# Patient Record
Sex: Male | Born: 1955 | Race: White | Hispanic: No | Marital: Married | Smoking: Former smoker
Health system: Southern US, Community
[De-identification: ages and names within clinical notes are randomized; demographics above are authoritative.]

## PROBLEM LIST (undated history)

## (undated) ENCOUNTER — Emergency Department (HOSPITAL_COMMUNITY): Discharge status: Absent without leave | Payer: BLUE CROSS/BLUE SHIELD

## (undated) DIAGNOSIS — D7582 Heparin induced thrombocytopenia (HIT): Secondary | ICD-10-CM

## (undated) DIAGNOSIS — I509 Heart failure, unspecified: Secondary | ICD-10-CM

## (undated) DIAGNOSIS — M545 Low back pain, unspecified: Secondary | ICD-10-CM

## (undated) DIAGNOSIS — I779 Disorder of arteries and arterioles, unspecified: Secondary | ICD-10-CM

## (undated) DIAGNOSIS — I5032 Chronic diastolic (congestive) heart failure: Secondary | ICD-10-CM

## (undated) DIAGNOSIS — D759 Disease of blood and blood-forming organs, unspecified: Secondary | ICD-10-CM

## (undated) DIAGNOSIS — E119 Type 2 diabetes mellitus without complications: Secondary | ICD-10-CM

## (undated) DIAGNOSIS — I209 Angina pectoris, unspecified: Secondary | ICD-10-CM

## (undated) DIAGNOSIS — G4730 Sleep apnea, unspecified: Secondary | ICD-10-CM

## (undated) DIAGNOSIS — J449 Chronic obstructive pulmonary disease, unspecified: Secondary | ICD-10-CM

## (undated) DIAGNOSIS — D649 Anemia, unspecified: Secondary | ICD-10-CM

## (undated) DIAGNOSIS — K219 Gastro-esophageal reflux disease without esophagitis: Secondary | ICD-10-CM

## (undated) DIAGNOSIS — F419 Anxiety disorder, unspecified: Secondary | ICD-10-CM

## (undated) DIAGNOSIS — R609 Edema, unspecified: Secondary | ICD-10-CM

## (undated) DIAGNOSIS — I499 Cardiac arrhythmia, unspecified: Secondary | ICD-10-CM

## (undated) DIAGNOSIS — I2510 Atherosclerotic heart disease of native coronary artery without angina pectoris: Secondary | ICD-10-CM

## (undated) DIAGNOSIS — I219 Acute myocardial infarction, unspecified: Secondary | ICD-10-CM

## (undated) DIAGNOSIS — F32A Depression, unspecified: Secondary | ICD-10-CM

## (undated) DIAGNOSIS — E669 Obesity, unspecified: Secondary | ICD-10-CM

## (undated) DIAGNOSIS — C44311 Basal cell carcinoma of skin of nose: Secondary | ICD-10-CM

## (undated) DIAGNOSIS — D75829 Heparin-induced thrombocytopenia, unspecified: Secondary | ICD-10-CM

## (undated) DIAGNOSIS — R011 Cardiac murmur, unspecified: Secondary | ICD-10-CM

## (undated) DIAGNOSIS — IMO0001 Reserved for inherently not codable concepts without codable children: Secondary | ICD-10-CM

## (undated) DIAGNOSIS — Z9981 Dependence on supplemental oxygen: Secondary | ICD-10-CM

## (undated) DIAGNOSIS — F329 Major depressive disorder, single episode, unspecified: Secondary | ICD-10-CM

## (undated) DIAGNOSIS — E785 Hyperlipidemia, unspecified: Secondary | ICD-10-CM

## (undated) DIAGNOSIS — C8590 Non-Hodgkin lymphoma, unspecified, unspecified site: Principal | ICD-10-CM

## (undated) DIAGNOSIS — I739 Peripheral vascular disease, unspecified: Secondary | ICD-10-CM

## (undated) DIAGNOSIS — I10 Essential (primary) hypertension: Secondary | ICD-10-CM

## (undated) DIAGNOSIS — N183 Chronic kidney disease, stage 3 unspecified: Secondary | ICD-10-CM

## (undated) DIAGNOSIS — G8929 Other chronic pain: Secondary | ICD-10-CM

## (undated) HISTORY — DX: Obesity, unspecified: E66.9

## (undated) HISTORY — DX: Atherosclerotic heart disease of native coronary artery without angina pectoris: I25.10

## (undated) HISTORY — DX: Heparin-induced thrombocytopenia, unspecified: D75.829

## (undated) HISTORY — PX: SUPERFICIAL LYMPH NODE BIOPSY / EXCISION: SUR127

## (undated) HISTORY — DX: Sleep apnea, unspecified: G47.30

## (undated) HISTORY — DX: Major depressive disorder, single episode, unspecified: F32.9

## (undated) HISTORY — PX: BASAL CELL CARCINOMA EXCISION: SHX1214

## (undated) HISTORY — DX: Non-Hodgkin lymphoma, unspecified, unspecified site: C85.90

## (undated) HISTORY — DX: Depression, unspecified: F32.A

## (undated) HISTORY — DX: Chronic obstructive pulmonary disease, unspecified: J44.9

## (undated) HISTORY — DX: Gastro-esophageal reflux disease without esophagitis: K21.9

## (undated) HISTORY — DX: Edema, unspecified: R60.9

## (undated) HISTORY — DX: Hyperlipidemia, unspecified: E78.5

## (undated) HISTORY — DX: Heparin induced thrombocytopenia (HIT): D75.82

## (undated) HISTORY — DX: Essential (primary) hypertension: I10

## (undated) HISTORY — DX: Type 2 diabetes mellitus without complications: E11.9

---

## 1999-05-11 ENCOUNTER — Ambulatory Visit (HOSPITAL_COMMUNITY): Admission: RE | Admit: 1999-05-11 | Discharge: 1999-05-11 | Payer: Self-pay | Admitting: Cardiovascular Disease

## 2000-04-04 ENCOUNTER — Emergency Department (HOSPITAL_COMMUNITY): Admission: EM | Admit: 2000-04-04 | Discharge: 2000-04-04 | Payer: Self-pay | Admitting: Emergency Medicine

## 2000-04-04 ENCOUNTER — Ambulatory Visit (HOSPITAL_COMMUNITY): Admission: RE | Admit: 2000-04-04 | Discharge: 2000-04-04 | Payer: Self-pay | Admitting: Urology

## 2000-04-04 ENCOUNTER — Encounter: Admission: RE | Admit: 2000-04-04 | Discharge: 2000-04-04 | Payer: Self-pay | Admitting: Urology

## 2000-04-04 ENCOUNTER — Encounter: Payer: Self-pay | Admitting: Urology

## 2000-04-09 ENCOUNTER — Ambulatory Visit (HOSPITAL_COMMUNITY): Admission: RE | Admit: 2000-04-09 | Discharge: 2000-04-09 | Payer: Self-pay | Admitting: Urology

## 2000-04-09 ENCOUNTER — Encounter: Payer: Self-pay | Admitting: Urology

## 2000-04-10 ENCOUNTER — Emergency Department (HOSPITAL_COMMUNITY): Admission: EM | Admit: 2000-04-10 | Discharge: 2000-04-10 | Payer: Self-pay | Admitting: Emergency Medicine

## 2000-04-11 ENCOUNTER — Ambulatory Visit (HOSPITAL_COMMUNITY): Admission: RE | Admit: 2000-04-11 | Discharge: 2000-04-12 | Payer: Self-pay | Admitting: Urology

## 2000-04-12 ENCOUNTER — Encounter: Payer: Self-pay | Admitting: Urology

## 2000-04-17 ENCOUNTER — Encounter: Admission: RE | Admit: 2000-04-17 | Discharge: 2000-04-17 | Payer: Self-pay | Admitting: Urology

## 2000-04-17 ENCOUNTER — Encounter: Payer: Self-pay | Admitting: Urology

## 2000-04-20 ENCOUNTER — Encounter: Payer: Self-pay | Admitting: Emergency Medicine

## 2000-04-21 ENCOUNTER — Inpatient Hospital Stay (HOSPITAL_COMMUNITY): Admission: EM | Admit: 2000-04-21 | Discharge: 2000-04-22 | Payer: Self-pay | Admitting: Emergency Medicine

## 2000-05-14 ENCOUNTER — Encounter: Admission: RE | Admit: 2000-05-14 | Discharge: 2000-05-14 | Payer: Self-pay | Admitting: Urology

## 2000-05-14 ENCOUNTER — Encounter: Payer: Self-pay | Admitting: Urology

## 2000-06-14 ENCOUNTER — Encounter: Payer: Self-pay | Admitting: Urology

## 2000-06-14 ENCOUNTER — Ambulatory Visit (HOSPITAL_COMMUNITY): Admission: RE | Admit: 2000-06-14 | Discharge: 2000-06-14 | Payer: Self-pay | Admitting: Urology

## 2000-06-19 ENCOUNTER — Encounter: Payer: Self-pay | Admitting: Urology

## 2000-06-19 ENCOUNTER — Emergency Department (HOSPITAL_COMMUNITY): Admission: EM | Admit: 2000-06-19 | Discharge: 2000-06-19 | Payer: Self-pay | Admitting: Emergency Medicine

## 2000-06-27 ENCOUNTER — Encounter: Payer: Self-pay | Admitting: Urology

## 2000-06-27 ENCOUNTER — Encounter: Admission: RE | Admit: 2000-06-27 | Discharge: 2000-06-27 | Payer: Self-pay | Admitting: Urology

## 2000-08-21 ENCOUNTER — Encounter: Payer: Self-pay | Admitting: Urology

## 2000-08-21 ENCOUNTER — Encounter: Admission: RE | Admit: 2000-08-21 | Discharge: 2000-08-21 | Payer: Self-pay | Admitting: Urology

## 2002-04-30 ENCOUNTER — Ambulatory Visit (HOSPITAL_BASED_OUTPATIENT_CLINIC_OR_DEPARTMENT_OTHER): Admission: RE | Admit: 2002-04-30 | Discharge: 2002-04-30 | Payer: Self-pay | Admitting: Cardiovascular Disease

## 2003-08-15 HISTORY — PX: CORONARY ARTERY BYPASS GRAFT: SHX141

## 2004-02-12 ENCOUNTER — Inpatient Hospital Stay (HOSPITAL_BASED_OUTPATIENT_CLINIC_OR_DEPARTMENT_OTHER): Admission: RE | Admit: 2004-02-12 | Discharge: 2004-02-12 | Payer: Self-pay | Admitting: Cardiology

## 2004-02-26 ENCOUNTER — Inpatient Hospital Stay (HOSPITAL_COMMUNITY): Admission: AD | Admit: 2004-02-26 | Discharge: 2004-03-04 | Payer: Self-pay | Admitting: Cardiology

## 2004-06-22 ENCOUNTER — Ambulatory Visit: Payer: Self-pay | Admitting: Internal Medicine

## 2004-07-01 ENCOUNTER — Ambulatory Visit: Payer: Self-pay | Admitting: Cardiovascular Disease

## 2005-02-08 ENCOUNTER — Ambulatory Visit: Payer: Self-pay | Admitting: Cardiovascular Disease

## 2005-08-25 ENCOUNTER — Ambulatory Visit: Payer: Self-pay | Admitting: Endocrinology

## 2005-09-01 ENCOUNTER — Encounter: Payer: Self-pay | Admitting: Cardiology

## 2005-09-01 ENCOUNTER — Ambulatory Visit: Payer: Self-pay

## 2006-01-01 ENCOUNTER — Ambulatory Visit: Payer: Self-pay | Admitting: Cardiovascular Disease

## 2007-01-14 ENCOUNTER — Ambulatory Visit: Payer: Self-pay | Admitting: Cardiovascular Disease

## 2007-05-23 ENCOUNTER — Encounter: Payer: Self-pay | Admitting: *Deleted

## 2007-05-23 DIAGNOSIS — I10 Essential (primary) hypertension: Secondary | ICD-10-CM | POA: Insufficient documentation

## 2007-05-23 DIAGNOSIS — E785 Hyperlipidemia, unspecified: Secondary | ICD-10-CM | POA: Insufficient documentation

## 2007-05-23 DIAGNOSIS — G4730 Sleep apnea, unspecified: Secondary | ICD-10-CM | POA: Insufficient documentation

## 2007-07-19 ENCOUNTER — Ambulatory Visit: Payer: Self-pay | Admitting: Cardiovascular Disease

## 2008-02-03 ENCOUNTER — Ambulatory Visit: Payer: Self-pay | Admitting: Cardiovascular Disease

## 2008-03-06 ENCOUNTER — Ambulatory Visit: Payer: Self-pay

## 2008-03-11 ENCOUNTER — Ambulatory Visit: Payer: Self-pay | Admitting: Vascular Surgery

## 2008-03-14 HISTORY — PX: CAROTID ENDARTERECTOMY: SUR193

## 2008-03-14 HISTORY — PX: OTHER SURGICAL HISTORY: SHX169

## 2008-03-17 ENCOUNTER — Inpatient Hospital Stay (HOSPITAL_COMMUNITY): Admission: RE | Admit: 2008-03-17 | Discharge: 2008-03-19 | Payer: Self-pay | Admitting: Vascular Surgery

## 2008-03-17 ENCOUNTER — Encounter: Payer: Self-pay | Admitting: Vascular Surgery

## 2008-03-17 ENCOUNTER — Ambulatory Visit: Payer: Self-pay | Admitting: Vascular Surgery

## 2008-04-01 ENCOUNTER — Ambulatory Visit: Payer: Self-pay | Admitting: Vascular Surgery

## 2008-05-11 ENCOUNTER — Ambulatory Visit: Payer: Self-pay | Admitting: Vascular Surgery

## 2008-10-14 ENCOUNTER — Ambulatory Visit: Payer: Self-pay | Admitting: Vascular Surgery

## 2008-10-15 ENCOUNTER — Encounter: Admission: RE | Admit: 2008-10-15 | Discharge: 2008-10-15 | Payer: Self-pay | Admitting: Vascular Surgery

## 2008-10-21 ENCOUNTER — Ambulatory Visit: Payer: Self-pay | Admitting: Vascular Surgery

## 2009-02-11 DIAGNOSIS — I779 Disorder of arteries and arterioles, unspecified: Secondary | ICD-10-CM | POA: Insufficient documentation

## 2009-02-11 DIAGNOSIS — I739 Peripheral vascular disease, unspecified: Secondary | ICD-10-CM

## 2009-02-11 DIAGNOSIS — D7582 Heparin induced thrombocytopenia (HIT): Secondary | ICD-10-CM | POA: Insufficient documentation

## 2009-02-11 DIAGNOSIS — R609 Edema, unspecified: Secondary | ICD-10-CM | POA: Insufficient documentation

## 2009-02-12 ENCOUNTER — Ambulatory Visit: Payer: Self-pay | Admitting: Cardiovascular Disease

## 2009-06-23 ENCOUNTER — Ambulatory Visit: Payer: Self-pay | Admitting: Internal Medicine

## 2009-06-23 ENCOUNTER — Ambulatory Visit: Payer: Self-pay | Admitting: Cardiovascular Disease

## 2009-06-23 ENCOUNTER — Inpatient Hospital Stay (HOSPITAL_COMMUNITY): Admission: EM | Admit: 2009-06-23 | Discharge: 2009-06-25 | Payer: Self-pay | Admitting: Emergency Medicine

## 2009-06-24 ENCOUNTER — Encounter: Payer: Self-pay | Admitting: Internal Medicine

## 2009-06-24 ENCOUNTER — Encounter: Payer: Self-pay | Admitting: Cardiology

## 2009-06-25 ENCOUNTER — Encounter (INDEPENDENT_AMBULATORY_CARE_PROVIDER_SITE_OTHER): Payer: Self-pay | Admitting: *Deleted

## 2009-06-29 ENCOUNTER — Ambulatory Visit: Payer: Self-pay | Admitting: Internal Medicine

## 2009-06-29 DIAGNOSIS — J449 Chronic obstructive pulmonary disease, unspecified: Secondary | ICD-10-CM | POA: Insufficient documentation

## 2009-06-29 DIAGNOSIS — K219 Gastro-esophageal reflux disease without esophagitis: Secondary | ICD-10-CM | POA: Insufficient documentation

## 2009-06-29 DIAGNOSIS — R599 Enlarged lymph nodes, unspecified: Secondary | ICD-10-CM | POA: Insufficient documentation

## 2009-07-06 ENCOUNTER — Encounter: Payer: Self-pay | Admitting: Internal Medicine

## 2009-10-07 ENCOUNTER — Telehealth (INDEPENDENT_AMBULATORY_CARE_PROVIDER_SITE_OTHER): Payer: Self-pay | Admitting: *Deleted

## 2010-09-13 NOTE — Progress Notes (Signed)
       Additional Follow-up for Phone Call Additional follow up Details #2::    is pt suppose to take metorprolol succ or tartrate if pt has succ at home  we will leave him on this pt was recently at Kalman Shan MD metoptrolol tartrate was in med list pt states he is on metoprolol 50 succ 2 tabs daliy. Per D mathis ok to fill as this metoprolol succ 50 2 dailygave pt 6 refills Follow-up by: Kem Parkinson,  October 07, 2009 4:10 PM

## 2010-10-19 ENCOUNTER — Encounter: Payer: Self-pay | Admitting: Cardiovascular Disease

## 2010-10-25 NOTE — Miscellaneous (Signed)
  Clinical Lists Changes   Medications: Changed medication from METOPROLOL TARTRATE 50 MG TABS (METOPROLOL TARTRATE) Take 2 tablets once daily to METOPROLOL SUCCINATE 50 MG XR24H-TAB (METOPROLOL SUCCINATE) take 2 tablets by mouth daily.per previous phone note. [place], [place]  October 19, 2010 9:27 AM

## 2010-10-31 ENCOUNTER — Telehealth: Payer: Self-pay | Admitting: Internal Medicine

## 2010-11-10 ENCOUNTER — Telehealth: Payer: Self-pay | Admitting: *Deleted

## 2010-11-10 NOTE — Telephone Encounter (Signed)
Summary of Call: copd patient not seen since 2010. call and find out if he wants to come. pls give him fu Initial call taken by: Kalman Shan MD,  October 31, 2010 10:24 PM  ATCx1 no answer, no voicemail. WCB. Carron Curie, CMA

## 2010-11-15 NOTE — Progress Notes (Signed)
Summary: needs fu. not seen since 2010  Phone Note Outgoing Call   Summary of Call: copd patient not seen since 2010. call and find out if he wants to come. pls give him fu Initial call taken by: Kalman Shan MD,  October 31, 2010 10:24 PM  Follow-up for Phone Call        see. epic. Carron Curie CMA  November 10, 2010 12:15 PM

## 2010-11-16 LAB — BASIC METABOLIC PANEL
Calcium: 9.2 mg/dL (ref 8.4–10.5)
Calcium: 9.6 mg/dL (ref 8.4–10.5)
Chloride: 100 mEq/L (ref 96–112)
Creatinine, Ser: 0.97 mg/dL (ref 0.4–1.5)
Creatinine, Ser: 0.99 mg/dL (ref 0.4–1.5)
GFR calc Af Amer: >60 mL/min (ref >60)
GFR calc non Af Amer: >60 mL/min (ref >60)
GFR calc non Af Amer: >60 mL/min (ref >60)
Glucose, Bld: 145 mg/dL — ABNORMAL HIGH (ref 70–99)
Potassium: 5 mEq/L (ref 3.5–5.1)
Sodium: 138 mEq/L (ref 135–145)

## 2010-11-16 LAB — COMPREHENSIVE METABOLIC PANEL
AST: 32 U/L (ref 0–37)
Albumin: 4 g/dL (ref 3.5–5.2)
Chloride: 99 mEq/L (ref 96–112)
Creatinine, Ser: 0.88 mg/dL (ref 0.4–1.5)
GFR calc non Af Amer: >60 mL/min (ref >60)
Total Protein: 7.3 g/dL (ref 6.0–8.3)

## 2010-11-16 LAB — CBC
Hemoglobin: 15.4 g/dL (ref 13.0–17.0)
MCHC: 34.2 g/dL (ref 30.0–36.0)
RBC: 4.65 MIL/uL (ref 4.22–5.81)

## 2010-11-16 LAB — HEMOGLOBIN A1C: Hgb A1c MFr Bld: 5.7 % (ref 4.6–6.1)

## 2010-11-16 LAB — CARDIAC PANEL(CRET KIN+CKTOT+MB+TROPI)
CK, MB: 4.4 ng/mL — ABNORMAL HIGH (ref 0.3–4.0)
Relative Index: 1.8 (ref 0.0–2.5)
Relative Index: 1.9 (ref 0.0–2.5)
Total CK: 226 U/L (ref 7–232)
Troponin I: 0.01 ng/mL (ref 0.00–0.06)
Troponin I: 0.02 ng/mL (ref 0.00–0.06)

## 2010-11-16 LAB — BRAIN NATRIURETIC PEPTIDE: Pro B Natriuretic peptide (BNP): 37 pg/mL (ref 0.0–100.0)

## 2010-11-16 LAB — GLUCOSE, CAPILLARY: Glucose-Capillary: 169 mg/dL — ABNORMAL HIGH (ref 70–99)

## 2010-11-16 LAB — TSH: TSH: 1.832 u[IU]/mL (ref 0.350–4.500)

## 2010-11-16 NOTE — Telephone Encounter (Signed)
ATCx2. WCB. Carron Curie, CMA

## 2010-11-17 NOTE — Telephone Encounter (Signed)
Pt set to see MR on 12-02-10 at 4:15.Carron Curie, CMA

## 2010-12-02 ENCOUNTER — Encounter: Payer: Self-pay | Admitting: Internal Medicine

## 2010-12-02 ENCOUNTER — Ambulatory Visit (INDEPENDENT_AMBULATORY_CARE_PROVIDER_SITE_OTHER): Payer: BC Managed Care – PPO | Admitting: Internal Medicine

## 2010-12-02 VITALS — BP 150/78 | HR 69 | Temp 98.7°F | Ht 72.0 in | Wt 367.4 lb

## 2010-12-02 DIAGNOSIS — Z720 Tobacco use: Secondary | ICD-10-CM | POA: Insufficient documentation

## 2010-12-02 DIAGNOSIS — F17200 Nicotine dependence, unspecified, uncomplicated: Secondary | ICD-10-CM

## 2010-12-02 DIAGNOSIS — J449 Chronic obstructive pulmonary disease, unspecified: Secondary | ICD-10-CM

## 2010-12-02 MED ORDER — ALBUTEROL 90 MCG/ACT IN AERS: 2.0000 | INHALATION_SPRAY | Freq: Four times a day (QID) | RESPIRATORY_TRACT | Status: DC | PRN

## 2010-12-02 MED ORDER — TIOTROPIUM BROMIDE MONOHYDRATE 18 MCG IN CAPS: 18.0000 ug | ORAL_CAPSULE | Freq: Every day | RESPIRATORY_TRACT | Status: DC

## 2010-12-02 NOTE — Patient Instructions (Signed)
#  COPD Restart spiriva 1 puff daily - take sample, discount card and learn technique Take albuterol only as needed #smoking Please work on quitting #followup 3 months to report progress Alpha 1 check at fu

## 2010-12-02 NOTE — Assessment & Plan Note (Signed)
Stable disease  Plan Restart spiriva Albuterol prn

## 2010-12-02 NOTE — Progress Notes (Signed)
Subjective:    Patient ID: [name], male    DOB: 12-10-1955, 55 y.o.   MRN: [id number]  HPI14 year old morbidly obese male with CAD and OSA (non compliant with CPAP). Left sided NECk  Node SUV 4 on PET Nov 2010 followed by Dr Annalee Genta and gold stage 3 COPD (fev1 1.31/32% in nov 2010), and continued tobacco abuse  OV 12/02/2010: Last seen in Nov 2010. Not seen since then. Since then doing well overall. Smoking relapsed. Wants to quit but refuses chantix and zyban. STates he will try vapors of menthol. Dyspnea is class 2 and stable. Has not followed iwthDr. Annalee Genta regarding neck node; states he sppoke to him and was told it is benign and he is self-monitoring. No problems so far. Mild cough +    Review of Systems Constitutional:   No  weight loss, night sweats,  Fevers, chills, fatigue, lassitude. HEENT:   No headaches,  Difficulty swallowing,  Tooth/dental problems,  Sore throat,                No sneezing, itching, ear ache, nasal congestion, post nasal drip,   CV:  No chest pain,  Orthopnea, PND, swelling in lower extremities, anasarca, dizziness, palpitations  GI  No heartburn, indigestion, abdominal pain, nausea, vomiting, diarrhea, change in bowel habits, loss of appetite  Resp:   No coughing up of blood.  No change in color of mucus.  No wheezing.  No chest wall deformity  Skin: no rash or lesions.  GU: no dysuria, change in color of urine, no urgency or frequency.  No flank pain.  MS:  No joint pain or swelling.  No decreased range of motion.  No back pain.  Psych:  No change in mood or affect. No depression or anxiety.  No memory loss.      Objective:   Physical Exam  Nursing note and vitals reviewed. Constitutional: He is oriented to person, place, and time. He appears well-developed and well-nourished. No distress.       obese  HENT:  Head: Normocephalic and atraumatic.  Right Ear: External ear normal.  Left Ear: External ear normal.  Mouth/Throat:  Oropharynx is clear and moist. No oropharyngeal exudate.  Eyes: Conjunctivae and EOM are normal. Pupils are equal, round, and reactive to light. Right eye exhibits no discharge. Left eye exhibits no discharge. No scleral icterus.  Neck: Normal range of motion. Neck supple. No JVD present. No tracheal deviation present. No thyromegaly present.  Cardiovascular: Normal rate, regular rhythm and intact distal pulses.  Exam reveals no gallop and no friction rub.   No murmur heard. Pulmonary/Chest: Effort normal and breath sounds normal. No respiratory distress. He has no wheezes. He has no rales. He exhibits no tenderness.       Scar of surgery +  Abdominal: Soft. Bowel sounds are normal. He exhibits no distension and no mass. There is no tenderness. There is no rebound and no guarding.  Musculoskeletal: Normal range of motion. He exhibits no edema and no tenderness.  Lymphadenopathy:    He has no cervical adenopathy.  Neurological: He is alert and oriented to person, place, and time. He has normal reflexes. No cranial nerve deficit. Coordination normal.  Skin: Skin is warm and dry. No rash noted. He is not diaphoretic. No erythema. No pallor.  Psychiatric: He has a normal mood and affect. His behavior is normal. Judgment and thought content normal.          Assessment & Plan:

## 2010-12-02 NOTE — Assessment & Plan Note (Signed)
3 min counseling to quit. Refused medication intervention

## 2010-12-12 ENCOUNTER — Telehealth: Payer: Self-pay | Admitting: Internal Medicine

## 2010-12-12 NOTE — Telephone Encounter (Signed)
lmomtcb x1 According to phone note 12/02/10 rx was sent x 6 refills

## 2010-12-15 ENCOUNTER — Telehealth: Payer: Self-pay | Admitting: Internal Medicine

## 2010-12-15 DIAGNOSIS — J449 Chronic obstructive pulmonary disease, unspecified: Secondary | ICD-10-CM

## 2010-12-15 MED ORDER — ALBUTEROL 90 MCG/ACT IN AERS: 2.0000 | INHALATION_SPRAY | Freq: Four times a day (QID) | RESPIRATORY_TRACT | Status: DC | PRN

## 2010-12-15 NOTE — Telephone Encounter (Signed)
Spoke w/ pt and he states his rx for his albuterol was thrown away by accident. Pt needed [place] to call his albuterol rx in fro him. I advised pt would send it in for him. Rx accidentally printed off so re done rx to send electronically

## 2010-12-27 NOTE — Assessment & Plan Note (Signed)
OFFICE VISIT   [name], [name]  DOB:  1955/11/27                                       10/21/2008  VELFY#:[id number]   The patient is seen back in follow-up today.  He previously underwent  left carotid endarterectomy in August 2009.  He was seen in the office  last week and had a carotid duplex exam at that time which showed no  significant stenosis on the left side and a less than 40% stenosis on  the right side.  However, he had noticed a lump in his left  supraclavicular region.  He states that he had noticed this soon after  his carotid endarterectomy.  He does not think it has changed in size.  However, he states that it has not gotten any smaller.  It is not tender  on palpation.   He had a CT scan of the neck today to further evaluate this.  This shows  an enlarged left supraclavicular node which is 2 cm x 1.4 cm in  diameter.  There is also an adjacent node which is enlarged but slightly  smaller.  There is also some pleural plaque in the left lung apex.   On physical exam today blood pressure is 154/74 in the left arm, 134/75  in the right arm, pulse is 76 and regular.  Left neck incision is well-  healed, there is no erythema.  The supraclavicular node is still  palpable and it is nontender.  It is fairly rubbery in consistency and  not firm.   As far as the patient's carotid is concerned, he needs a repeat carotid  duplex exam in 6 months' time.  I have referred him to Dr. Osborn Coho for further evaluation of his left supraclavicular node.  He  will determine whether or not this will need to be managed  conservatively, or possible biopsy with fine-needle aspiration.   Scott Hora. Fields, MD  Electronically Signed   CEF/MEDQ  D:  10/21/2008  T:  10/22/2008  Job:  [id number]   cc:   Noralyn Pick. Eden Emms, MD, [facility]  Kinnie Scales. Annalee Genta, M.D.

## 2010-12-27 NOTE — Procedures (Signed)
CAROTID DUPLEX EXAM   INDICATION:  Carotid artery disease.   HISTORY:  Diabetes:  No.  Cardiac:  CABG.  Hypertension:  Yes.  Smoking:  Yes.  Previous Surgery:  Left carotid endarterectomy with Dacron patch  angioplasty on 03/17/08 by Dr. Darrick Penna.  CV History:  No.  Amaurosis Fugax No, Paresthesias No, Hemiparesis No.                                       RIGHT             LEFT  Brachial systolic pressure:         148               157  Brachial Doppler waveforms:         Within normal limits                Within normal limits  Vertebral direction of flow:        Not visualized    Antegrade  DUPLEX VELOCITIES (cm/sec)  CCA peak systolic                   97                114  ECA peak systolic                   375               582  ICA peak systolic                   113               140  ICA end diastolic                   42                48  PLAQUE MORPHOLOGY:                  Heterogenous      Heterogenous  PLAQUE AMOUNT:                      Moderate          Severe  PLAQUE LOCATION:                    BIF, ICA, ECA     ECA   IMPRESSION:  1. 20-39% right internal carotid artery stenosis.  2. Patent left internal carotid artery with no evidence of restenosis.  3. Increased velocities noted in bilateral external carotid arteries.   ___________________________________________  Janetta Hora Fields, MD   AC/MEDQ  D:  10/14/2008  T:  10/14/2008  Job:  [id number]

## 2010-12-27 NOTE — Assessment & Plan Note (Signed)
OFFICE VISIT   [name], [name]  DOB:  [birth year]                                       03/11/2008  ZOXWR#:[id number]   The patient is a 55 year old male referred by [name] for evaluation  of asymptomatic carotid stenosis.  He recently had a carotid duplex exam  for asymptomatic bruit.  He has had no symptoms of TIA, amaurosis or  stroke.  He has no significant family history.  His atherosclerotic risk  factors include obesity, hypertension, elevated cholesterol and coronary  artery disease.  He had a previous coronary bypass grafting by Dr. Cornelius Moras  in 2005.   PAST SURGICAL HISTORY:  Remarkable for coronary artery bypass grafting  in 2005.  He has also had removal of left renal stones.   PAST MEDICAL HISTORY:  Hypertension as mentioned above.  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   FAMILY HISTORY:  Is remarkable for his father who had vascular disease  at a young age.   SOCIAL HISTORY:  He is single, has one child.  Works as a [employer].  He currently smokes 1-1/2 packs of cigarettes per day.  He does not consume alcohol regularly.   REVIEW OF SYSTEMS:  He is 6 feet tall, 348 pounds.  Cardiac, pulmonary,  GI, renal, vascular, neurologic, orthopedic, psychiatric, ENT,  hematologic review of systems are all negative.   MEDICATIONS:  1. Include potassium 20 once a day.  2. Furosemide 80 mg once a day.  3. Metoprolol ER 50 mg 2 tablets daily.  4. Aspirin 81 mg once a day.   ALLERGIES:  He states that he had a reaction to heparin at the time of  his coronary artery bypass grafting which required bleeding.  Review of  this discharge summary suggests that he may have had a HIT phenomenon.   PHYSICAL EXAM:  Vital signs:  Blood pressure 148/79 in the left arm,  149/80 in the right arm, pulse is 68 and regular.  HEENT:  Is  unremarkable.  Neck:  He has a left carotid bruit.  He has 2+ carotid  pulses.  Chest:  Clear to  auscultation.  Cardiovascular:  Regular rate  and rhythm.  Abdomen:  Is very obese, soft, nontender, nondistended.  No  masses.  Extremities:  He has 2+ brachial and radial pulses bilaterally.  Lower extremities, femoral pulses are not palpable most likely secondary  to obesity.  He does have a 2+ right dorsalis pedis pulse.  He has  absent pedal pulses in the left leg.  Feet are pink, warm and adequately  perfused.   I reviewed his carotid duplex exam from [facility] dated  03/06/2008.  This showed a high-grade left internal carotid artery  stenosis with a peak systolic velocity of 653 cm/sec and end-diastolic  velocity of 377 cm/sec.  He had a less than 60% stenosis on the right  side.  Vertebral artery flow was antegrade bilaterally.   I believe the patient would benefit from left carotid endarterectomy for  stroke prophylaxis.  Procedure risks, benefits and possible  complications were explained to the patient today including not limited  to bleeding, infection, stroke, cranial nerve injury.  He understands  and agrees to proceed.  We will contact the pharmacy ahead of time as we  will most likely anticoagulate  him with hirudin during the procedure  rather than heparin with his prior history of HIT.  Carotid  endarterectomy is scheduled for 03/17/2008.   Janetta Hora. Fields, MD  Electronically Signed   CEF/MEDQ  D:  03/12/2008  T:  03/12/2008  Job:  [id number]   cc:   Noralyn Pick. Eden Emms, MD, [facility]

## 2010-12-27 NOTE — Assessment & Plan Note (Signed)
OFFICE VISIT   [name], [name]  DOB:  1956-01-30                                       10/14/2008  EAVWU#:[id number]   The patient is a 55 year old male who previously underwent carotid  endarterectomy and returns for further followup today.  He states he has  had no neurologic symptoms since his last office visit.  However, he  states he has found a lump in the left supraclavicular region.  His  atherosclerotic risk factors continue to include obesity, hypertension,  elevated cholesterol and coronary artery disease.  He is still smoking.   On physical exam blood pressure is 150/81 in the left arm, 145/71 in the  right arm, pulse is 72 and regular.  HEENT is remarkable for a left  supraclavicular mass which is approximately 2 cm in diameter.  This  feels lymph node in character and is soft, not fixed and rubbery in  character.  His left neck incision is well-healed.  There is no other  palpable adenopathy in this area or on the contralateral side.  He  denies any cough.  He denies any hemoptysis.  Neurologic exam shows  symmetric upper extremity and lower extremity motor strength which is  5/5 and symmetric.   The patient had a repeat carotid duplex exam today which showed less  than 40% stenosis of the carotid on the right side and no restenosis on  the left side.   In light of the patient's smoking history and he now has a palpable what  appears to be lymph node in the left supraclavicular region I believe he  needs a neck CT to further define this.  He will follow up with me next  week after the CT has been performed.   Scott Hora. Fields, MD  Electronically Signed   CEF/MEDQ  D:  10/21/2008  T:  10/22/2008  Job:  [id number]

## 2010-12-27 NOTE — Discharge Summary (Signed)
NAME:  MICKLE, CAMPTON NO.:  [id number]   MEDICAL RECORD NO.:  [id number]          PATIENT TYPE:  INP   LOCATION:  2027                         FACILITY:  [facility]   PHYSICIAN:  Janetta Hora. Fields, MD  DATE OF BIRTH:  Jan 28, 1956   DATE OF ADMISSION:  03/17/2008  DATE OF DISCHARGE:                               DISCHARGE SUMMARY   DISCHARGE DIAGNOSES:  1. Left carotid occlusive disease.  2. Postoperative left neck hematoma.  3. Coronary artery disease.  4. 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   PROCEDURES PERFORMED:  1. Left carotid endarterectomy with Dacron patch angioplasty closure      on March 17, 2008, by Dr. Darrick Penna.  2. Left neck exploration by Dr. Darrick Penna on March 17, 2008.   COMPLICATIONS:  None.   DISCHARGE MEDICATIONS:  1. Potassium 20 mEq p.o. daily.  2. Lasix 80 mg p.o. daily.  3. Metoprolol ER 50 mg two p.o. q.a.m.  4. Aspirin 81 mg p.o. q.a.m.  5. Flomax one p.o. daily.  6. Percocet 5/325 one p.o. q.4 h. p.r.n. pain.   CONDITION ON DISCHARGE:  Stable, improving.   DISPOSITION:  He is being discharged home to the care of his family.  He  is instructed to clean his wound with soap and warm water.  He is  instructed to observe his wounds for signs of an infection.  He is  cautioned about lifting and driving for the next two weeks.  He is to  return to see Dr. Darrick Penna in two weeks for followup.   Brief identifying statement for complete details, please refer the typed  history and physical.  Briefly, this is a very pleasant 55 year old  gentleman who was referred to Dr. Darrick Penna for a narrowing of his left  internal carotid artery.  Dr. Darrick Penna recommended left carotid  endarterectomy for stroke prevention.  He was informed of the risks and  benefits of the procedure and after careful consideration elected to  proceed with surgery.   HOSPITAL COURSE:  Preoperative workup was completed as an outpatient.  He was brought in through  same day surgery and underwent the  aforementioned carotid endarterectomy.  For complete details, please  refer the typed operative report.  The procedure was without  complications.  He was returned to the Post Anesthesia Care Unit,  extubated.  Following stabilization, he was transferred to a bed in a  surgical step-down unit.  In the surgical step-down unit, he was noted  to have increasing swelling.  He was evaluated by Dr. Darrick Penna and found  to have a significant hematoma.  Dr. Darrick Penna felt that this should be  evacuated.  He was returned to the operating room and underwent left  neck exploration with evacuation of hematoma.  No bleeding sites were  identified.  The wound was closed.  He was returned to a bed in a  surgical intensive care unit.  Of note, he remained intubated overnight  due to his size and the fact that he did have a marked difficult  intubation.  The following morning, he was extubated.  His diet and  activity were advanced.   The following morning, he was eating solid foods.  He was walking and  desires of discharge.  He was in stable condition and discharged home.      Wilmon Arms, PA      Janetta Hora. Fields, MD  Electronically Signed    KEL/MEDQ  D:  03/19/2008  T:  03/19/2008  Job:  [id number]

## 2010-12-27 NOTE — Assessment & Plan Note (Signed)
[facility]                            CARDIOLOGY OFFICE NOTE   Scott, Blevins                      MRN:          [id number]  DATE:07/19/2007                            DOB:          1956-07-15    [name] returns for followup.  He is status post CABG in 2005.  He has  good left ventricular function.  Unfortunately, he continues to balloon  in weight, he is up to 334.  We had a long discussion about this.  I  actually recommended bariatric surgery to him at this time.  His  coronary grafts are stable.  He is not having chest pain, he has good  left ventricular function.  His last Myoview was nonischemic.  I suspect  at this rate [name] will have arthritis in his knees and diabetes  within a year.  Unfortunately, he continues to eat poorly and does not  exercise.   REVIEW OF SYSTEMS:  His review of systems is otherwise negative.   CURRENT MEDICATIONS:  1. K-Dur 20 a day.  2. Toprol 100 a day.  3. An aspirin a day.  4. Furosemide 80 a day.  5. Lipitor 20 a day.   In regards to his heart, he has not had any significant chest pain.  He  has chronic lower extremity edema which he sometimes takes an extra  Lasix for.  This has been stable.  There has been no cellulitis or DVT.   His exam is remarkable for morbidly obese white male in no distress.  His weight is __________ . Blood pressure is 150/70, pulse 80 and  regular, afebrile.  Respiratory rate 14.  HEENT:  Unremarkable.  Carotids are normal without bruit.  No  lymphadenopathy, thyromegaly, no JVP elevation.  LUNGS:  Clear, good diaphragmatic motion, no wheezing.  S1, S2 with distant heart sounds.  PMI not palpable.  ABDOMEN:  Protuberant, bowel sounds positive.  No AAA, no tenderness, no  hepatosplenomegaly, hepatojugular reflux.  Distal pulses are intact with no edema.  NEURO:  Nonfocal.  SKIN:  Warm and dry.   1. Coronary disease, previous coronary artery bypass grafting,  followup Myoview in the year.  Continue aspirin and beta blocker.  2. Lower extremity edema, stable, secondary to morbid obesity and      dependent edema.  Continue Lasix and potassium.  Euvolemic.  3. Morbid obesity, recommended bariatric surgery.  Patient not      interested at this time.  4. Hypertension, currently well controlled.  Continue with diuretic      and beta blocker.  May need angiotensin-converting enzyme inhibitor      in the future.   Again, I read [name] the riot act today.  He is also smoking.  He has  tried Chantix in the past.  He is down to less than half a pack a day.  I counseled him for less than 10 minutes about this, but I suspect if  [name] does not change his ways, he is going to be in some trouble in  the next year or 2.  Noralyn Pick. Scott Emms, MD, [facility]  Electronically Signed    PCN/MedQ  DD: 07/19/2007  DT: 07/20/2007  Job #: [id number]

## 2010-12-27 NOTE — Assessment & Plan Note (Signed)
[facility]                            CARDIOLOGY OFFICE NOTE   GLENFORD, GARIS                      MRN:          [id number]  DATE:01/14/2007                            DOB:          Jun 26, 1956    [name] returns today for followup.  [name] is status post CABG, and  [name] has not had any significant recurrent chest pain.  His LV function  has been low normal.   His bypass surgery was in 2005.   [name] unfortunately continues to have somewhat poor risk factor  modification.  [name] is continuing to be morbidly obese.  [name] is continuing  to smoke up to two packs a day.  The patient has also been fairly  sedentary.   We need to recheck his cholesterol.  [name] is on Lipitor, but does not  recall the dose.  [name] will call us with this.  His last LDL was 71.   REVIEW OF SYSTEMS:  Remarkable for question of recent [facility]  spotted fever.  [name] had a sore on the upper back, and apparently is being  tested for it.  [name] is being treated with doxycycline.  Apparently his  doctor told him to stop his aspirin in the meantime.   Review of Systems is remarkable for some increased lower extremity  edema; otherwise, negative.   MEDICATIONS:  1. K-Dur 20 a day.  2. Toprol 100 a day.  3. Aspirin a day.  4. Lasix 80 a day.  5. Lipitor.  [name] is to call us with the dose.   PHYSICAL EXAMINATION:  GENERAL:  A middle-aged male who is morbidly  obese in no distress.  VITAL SIGNS:  Weight 334, blood pressure 150/80, pulse 81 and regular,  respiratory rate 16, afebrile.  HEENT:  Normal.  JVP is not elevated.  There is no bruit, no  thyromegaly, no lymphadenopathy.  LUNGS:  Clear with no wheezing.  There is normal diaphragmatic motion.  HEART:  There is an S1, S2.  There is no murmur, rub, gallop or click.  PMI is not palpable.  Sternum is well-healed.  ABDOMEN:  Protuberant.  There are positive bowel sounds with no  tenderness, no hepatosplenomegaly, no hepatojugular  reflux, no AAA.  Femorals are +3 bilaterally without bruit.  PTs are +2 bilaterally.  [name]  has +1 lower extremity edema bilaterally.  NEUROLOGICAL:  Nonfocal.  SKIN:  Warm and dry.  MUSCULOSKELETAL:  There is no muscular weakness.   [name] does have a healed eschar in the right back with some petechiae in  the lower extremities.   His EKG shows sinus rhythm with chronic right bundle branch block.   IMPRESSION:  A 55 year old status post CABG without recurrent chest  pain.  No indication for stress test at this point.  Continue beta  blocker and aspirin.   [name] will have to resume his aspirin once it is cleared from his [facility] spotted fever diagnosis.   Lower extremity edema, stable.  Continue current dose of Lasix and  potassium.  Continue low salt diet.   Smoking.  Spent  less than 10 minutes counseling the patient.  Wrote him  a scrip for Chantix and encouraged his wife to do the same.  She is a  smoker and already has a prescription for Chantix.   The patient will follow up with his primary M.D. in regards to his [facility] spotted fever.  I suggested to him that they also check Lyme  titers as this is another common tick-borne disease.  His EKG showed a  chronic right bundle branch block.  There has been no syncope and no  high grade heart block.  I think that his conduction system is stable.  I will see him back in six months.     Noralyn Pick. Eden Emms, MD, [facility]  Electronically Signed    PCN/MedQ  DD: 01/14/2007  DT: 01/15/2007  Job #: [id number]

## 2010-12-27 NOTE — Op Note (Signed)
NAME:  JOHANTHAN, KNEELAND NO.:  [id number]   MEDICAL RECORD NO.:  [id number]          PATIENT TYPE:  INP   LOCATION:  2027                         FACILITY:  [facility]   PHYSICIAN:  Janetta Hora. Fields, MD  DATE OF BIRTH:  1955-11-09   DATE OF PROCEDURE:  03/17/2008  DATE OF DISCHARGE:                               OPERATIVE REPORT   PROCEDURE:  Left carotid endarterectomy.   PREOPERATIVE DIAGNOSIS:  High-grade left internal carotid artery  stenosis.   POSTOPERATIVE DIAGNOSIS:  High-grade left internal carotid artery  stenosis.   ANESTHESIA:  General.   ASSISTANT:  Wilmon Arms, PA-C   INDICATIONS:  The patient is a 55 year old male noted to have a high-  grade left internal carotid artery stenosis which is asymptomatic by  duplex exam.   OPERATIVE FINDINGS:  1. High-grade greater than 90% left internal carotid artery stenosis.  2. Dacron patch.  3. 10-French shunt.   OPERATIVE DETAILS:  After obtaining informed consent, the patient was  taken to the operating room.  The patient was placed in supine position  on the operating table.  After induction of general anesthesia and  endotracheal intubation, the patient's entire left neck and chest were  prepped and draped in usual sterile fashion.  An oblique incision was  made on the left side of the neck just anterior to the border of the  left sternocleidomastoid muscle.  Incision was carried down through the  subcutaneous tissues and the platysma.  Sternocleidomastoid muscle was  reflected laterally.  Internal jugular vein was identified.  Dissection  was carried along the anterior border of this.  Common facial vein was  identified, dissected free circumferentially, and ligated and divided  between silk ties.  Common carotid artery was then identified and  dissected free circumferentially.  Vagus nerve was identified and  protected.  Dissection was then carried up the level of the carotid  bifurcation.   Superior thyroid and external carotid arteries were  dissected free circumferentially and vessel loops were placed around  these.  Next, the internal carotid artery was dissected free  circumferentially above the level of palpable disease.  This was up to  the level of the hypoglossal nerve, but not extending past it.  The ansa  cervicalis was divided so that the hypoglossal nerve could be mobilized  slightly superiorly.  Next, the patient was given a dose of Refludan 0.4  mg/kg, which was a total dose of 63 mg.  After 2 minutes of circulation  time, the distal internal carotid artery was controlled with a fine  bulldog clamp.  The external carotid and superior thyroid arteries were  controlled with vessel loops.  Common carotid artery was controlled with  a peripheral DeBakey clamp.  A longitudinal opening was made in the  common carotid artery and this was extended up through the carotid  bifurcation through the level of disease.  There was a high-grade  stenosis greater than 90%.  There was a good endpoint past the level of  the disease below the level of clamp site.  Next, a 10-French shunt was  brought up in the operative field and this was threaded into the distal  internal carotid artery and allowed to back bleed thoroughly.  This was  then threaded down to the common carotid artery and secured with a Rumel  tourniquet.  Flow was then opened with restoration of flow to the brain  through the shunt after approximately 5 minutes.  Next, endarterectomy  was begun in a suitable plane.  There was an ulcerated plaque in the  common carotid artery, which had penetrated fairly deeply, this resulted  in a small perforation of the posterior wall of the common carotid  artery, and this was repaired with two single 7-0 Prolene sutures.  A  good distal endpoint was obtained in the internal carotid artery.  The  external carotid artery was endarterectomized by eversion technique.  A  good  proximal endpoint was also obtained in the proximal common carotid  artery.  The plaque was sent to Pathology as specimen.  All loose debris  was removed from the carotid artery and this was thoroughly irrigated  with a dextran solution.  Next, a Dacron patch was brought up on the  operative field and this was sewn on as patch angioplasty using a  running 6-0 Prolene suture.  Just prior to completion anastomosis, it  was fore bled, back bled, and thoroughly flushed.  The shunt was removed  and everything reoccluded.  Again, the artery was thoroughly irrigated  with dextran solution.  Remainder of the patch was then secured.  Flow  was then first restored up the external carotid artery and after  approximately 5 cardiac cycles, flow restored to the internal carotid  artery.  Hemostasis was obtained with 1 repair stitch on the lateral  wall and with direct pressure.  Doppler was used to evaluate the carotid  artery, and there was good flow in the internal, external, and common  carotid arteries.  After hemostasis had been obtained, the platysma  muscle was reapproximated using running 3-0 Vicryl suture.  Skin was  closed with 4-0 Vicryl subcuticular stitch.  A 10 flat Jackson-Pratt  drain was brought out through a separate stab incision at the base of  the neck and secured in place with a 3-0 nylon suture.  The patient  tolerated the procedure well and there were no complications.  Instrument, sponge, and needle counts were correct at the end of the  case.  The patient was taken to the recovery room in stable condition,  moving upper extremities and lower extremities symmetrically from a  motor standpoint.      Janetta Hora. Fields, MD  Electronically Signed     CEF/MEDQ  D:  03/18/2008  T:  03/18/2008  Job:  [id number]

## 2010-12-27 NOTE — Assessment & Plan Note (Signed)
[facility]                            CARDIOLOGY OFFICE NOTE   MASAKI, ROTHBAUER                      MRN:          [id number]  DATE:02/03/2008                            DOB:          03/26/1956    [name] returns today in followup.  He was status post CABG in 2005.  He  is doing well.  He denies any significant chest pain.  He has had  chronic lower extremity edema due to his obesity.  It appears stable.   He has cut back on his smoking quite a bit.  He smoked 3 packs a day and  is down to about half a pack every 3 days.  I congratulated him on this.  However, he is not taking his Chantix anymore.  His wife is trying to  quit with him. We spent less than 10 minutes counseling today regarding  the importance of this.   REVIEW OF SYSTEMS:  Otherwise negative.   MEDICATIONS:  1. K-Dur 20 mEq a day.  2. Toprol 100 mg a day.  3. An aspirin.  4. Furosemide 80 mg a day.   There is a question of previous HIT syndrome.   PHYSICAL EXAMINATION:  GENERAL:  Remarkable for a morbidly obese white  male, in no distress.  VITAL SIGNS:  Weight continues to be elevated at 346, blood pressure is  130/70, pulse 71 and regular, respiratory 14, afebrile.  HEENT:  Unremarkable.  NECK:  He has bilateral carotid bruits.  JVP is not elevated.  There is  no lymphadenopathy or thyromegaly.  LUNGS:  Clear.  Good diaphragmatic motion.  No wheezing.  HEART:  S1 and S2.  Distant heart sounds.  PMI not palpable.  ABDOMEN:  Protuberant.  Bowel sounds positive.  No AAA.  No bruit.  No  hepatosplenomegaly or hepatojugular reflux.  No tenderness.  EXTREMITIES:  Distal pulses are intact with +1 edema bilaterally.  PTs  are +2.  NEURO:  Nonfocal.  SKIN:  Warm and dry.  No muscular weakness.   EKG shows a right bundle-branch block which is old.   IMPRESSION:  1. Coronary disease, previous coronary artery bypass graft, no chest      pain.  Continue aspirin and  beta-blocker.  2. Lower extremity edema, dependent.  Continue Lasix and low-salt      diet.  3. Bilateral carotid bruits.  Check carotid duplex.  Continue aspirin      therapy.  4. History of HIT.  Follow up platelet counts yearly.  Avoid all      heparin products during any hospitalization.  5. Smoking cessation.  Continue to try to cut back.  We will give the      patient Wellbutrin in addition if he starts to smoke more.   I will see him back in a year so long as his carotid duplex does not  show high-grade disease.     [name]. Scott Emms, MD, [facility]  Electronically Signed    PCN/MedQ  DD: 02/03/2008  DT: 02/04/2008  Job #: [id number]

## 2010-12-27 NOTE — Op Note (Signed)
NAME:  [name], WATERSON NO.:  [id number]   MEDICAL RECORD NO.:  [id number]          PATIENT TYPE:  INP   LOCATION:  2312                         FACILITY:  [facility]   PHYSICIAN:  Janetta Hora. Fields, MD  DATE OF BIRTH:  1956-08-05   DATE OF PROCEDURE:  03/17/2008  DATE OF DISCHARGE:                               OPERATIVE REPORT   PROCEDURE:  Evacuation of hematoma, left neck.   PREOPERATIVE DIAGNOSIS:  Hematoma, left neck.   POSTOPERATIVE DIAGNOSIS:  Hematoma, left neck.   ANESTHESIA:  General.   ASSISTANT:  Jerold Coombe, PA-C   INDICATIONS:  The patient is a 55 year old male who earlier today had a  left carotid endarterectomy.  In the postoperative period, he has now  developed a large left neck hematoma.  He is taken back to the operating  room for evacuation of the hematoma.   OPERATIVE FINDINGS:  1. No significant obvious bleeding.  2. Evacuation of hematoma.   OPERATIVE DETAILS:  After obtaining informed consent from the patient's  wife, the patient was taken to the operating room.  The patient was  placed in supine position on the operating table.  After induction of  general anesthesia and endotracheal intubation, the patient's entire  left neck and chest were prepped and draped in the usual sterile  fashion.  The preexisting left neck incision was reopened and all of the  platysma sutures were removed.  There was a large hematoma approximately  8 x 4 cm in the left side of the neck.  This was all fully evacuated.  The wound was then thoroughly irrigated with several liters of normal  saline solution.  There was one oozing spot on the common carotid artery  which was cauterized.  There was also one oozing spot in the  subcutaneous tissues which was cauterized.  There was no other obvious  significant bleeding.  Next, a new #10 flat Jackson-Pratt was brought up  in the operative field and this was placed over the level of the carotid  artery  patch.  Platysma was then closed with a running 3-0 Vicryl  suture.  The skin was closed with a 4-0 Vicryl subcuticular stitch.  The  TRAM was sutured into place with a 3-0 nylon suture.  The patient  tolerated the procedure well with no immediate complications.  Instrument, sponge, and needle counts were correct at the end of the  case.  The patient was taken to the recovery room in stable condition  but intubated due to airway edema.  The patient was moving upper  extremity and lower extremities symmetrically.     Janetta Hora. Fields, MD  Electronically Signed    CEF/MEDQ  D:  03/18/2008  T:  03/18/2008  Job:  [id number]

## 2010-12-27 NOTE — Assessment & Plan Note (Signed)
OFFICE VISIT   [name], [name]  DOB:  1955/11/08                                       04/01/2008  YBOFB#:[id number]   The patient returns for followup today after undergoing left carotid  endarterectomy on 03/17/2008.  He had a postoperative hematoma which was  evacuated in the OR the same day.  After that his hospital stay was  uncomplicated.   On exam today blood pressure is 137/76 in the left arm, 127/75 in the  right arm, heart rate 61 and regular.  Left neck still has some mild  swelling but overall looks good.  The incision is healing well.  Upper  extremity and lower extremity motor strength is 5/5.  He has no  difficulty swallowing and tongue is midline.   The patient will continue to take his aspirin and will follow up with me  in 6 months' time for repeat carotid duplex exam.   Janetta Hora. Fields, MD  Electronically Signed   CEF/MEDQ  D:  04/02/2008  T:  04/02/2008  Job:  [id number]   cc:   Noralyn Pick. Eden Emms, MD, [facility]

## 2010-12-30 NOTE — Discharge Summary (Signed)
NAME:  [name], [name]                         ACCOUNT NO.:  [id number]   MEDICAL RECORD NO.:  [id number]                   PATIENT TYPE:  INP   LOCATION:  2023                                 FACILITY:  [facility]   PHYSICIAN:  Salvatore Decent. Cornelius Moras, M.D.              DATE OF BIRTH:  Feb 08, 1956   DATE OF ADMISSION:  02/26/2004  DATE OF DISCHARGE:                                 DISCHARGE SUMMARY   ADMIT DIAGNOSIS:  Three-vessel coronary artery disease with class IV  unstable angina and acute myocardial infarction.   PAST MEDICAL HISTORY AND DISCHARGE DIAGNOSES:  1. Obesity.  2. Coronary artery disease.  3. Hypertension.  4. Hyperlipidemia.  5. Tobacco abuse.  6. Obstructive sleep apnea.  7. Class IV unstable angina.  8. Acute myocardial infarction.  9. Postoperative thrombocytopenia treated with platelets and resolved.  10.      An abnormal heparin-induced thrombocytopenia assay.  11.      Mild postoperative anemia which resolved.   ALLERGIES:  HEPARIN, which induces thrombocytopenia.   BRIEF HISTORY:  The patient is a 55 year old extremely obese male with a  known history of coronary artery disease.  The patient also has a known  history of hypertension, hyperlipidemia, tobacco abuse and angina.  The  patient described progressive symptoms of chest pain consistent with angina  to his cardiologist.  He underwent an outpatient stress Cardiolite exam that  was abnormal and prompted an elective cardiac catheterization.  This was  performed in early July and the findings were notable for ostial stenosis of  the left anterior descending coronary artery and a chronically occluded  right coronary artery as well as moderate disease in a large ramus  intermediate branch.  The left ventricular function was preserved.  The  patient was brought back for an elective repeat catheterization or  intravascular ultrasound of the left anterior descending coronary artery on  February 26, 2004 to further  assess the significance of the ostial stenosis of  the LAD.  During the week prior to the catheterization, the patient  developed significant progressive symptoms of chest pain and suffered a  prolonged episode of severe pain at rest 5 or 6 days prior to admission.  The patient was taken to the cardiac cath lab on February 26, 2004, where repeat  coronary arteriography was performed, followed by an intravascular  ultrasound of the LAD.  At that time, the patient developed severe  hemodynamic instability consistent with an acute myocardial infarction and  acute coronary ischemia.  This required placement of an intra-aortic balloon  pump for stabilization.  Dr. Salvatore Decent. Cornelius Moras of [facility] was contacted  regarding emergent cardiac surgical consultation.  The patient stabilized  after the intra-aortic balloon pump was paced and his blood pressure  normalized and his chest pain resolved.  Dr. Cornelius Moras evaluated the patient and  felt that an emergency coronary artery bypass graft surgery was warranted.  HOSPITAL COURSE:  The patient was admitted for an elective cardiac cath on  the morning of February 26, 2004.  This was followed by an intravascular  ultrasound of the LAD.  During that time, the patient developed severe  hemodynamic instability consistent with an acute MI and Dr. Cornelius Moras was  subsequently consulted.  The patient was subsequently brought to the OR on  February 26, 2004 for an emergent coronary artery bypass grafting x3.  The left  internal mammary artery was grafted to the distal left anterior descending  coronary artery, saphenous vein was grafted to the ramus intermediate  branch, and saphenous vein was grafted to the posterior descending coronary  artery.  Endoscopic saphenous vein harvest was performed on the left thigh.  The patient tolerated the procedure well and was hemodynamically stable  immediately postoperatively.  The patient was transferred from the OR to the  SICU in stable condition.   He was noted to have minimal chest tube output  overnight and was extubated uneventfully and remained entirely  hemodynamically stable.  In the early morning of February 27, 2004, the patient  was partially elevated in bed and given breathing treatments and coughed.  He had a sudden onset of a large amount of chest tube output which was dark  in color.  The patient also developed hypotension associated with this.  The  hypotension was successfully treated by volume administration, however, due  to the volume of the chest tube output and the possibility of ongoing  significant blood loss, the patient was emergently taken back to the OR for  mediastinal re-exploration.  The mediastinum was explored and there was a  very modest amount of residual clot found in the mediastinum.  All of the  residual clot was irrigated and evacuated and there was no sign of active  bleeding.  The patient was again returned to the SICU in stable condition.   On February 27, 2004, the patient was maintaining a normal sinus rhythm and his  vital signs were stable.  The chest tube output was minimal.  The patient  was in stable condition with the exception of postoperative  thrombocytopenia.  Platelet count was 43,000 and therefore the patient was  transfused with platelets.  The patient was again extubated without  complications and woke up from anesthesia neurologically intact.  On  postoperative day 2, the patient was noted to have a mild postoperative  anemia with a hemoglobin of 9.6.  Platelet count was again noted to be  decreased at 39,000 and he was again transfused with platelets.  A heparin-  induced thrombocytopenia panel was obtained and this was abnormal.  The  intra-aortic balloon pump was subsequently discontinued without  complications.  The patient was diuresed and mobilized at that time.  Chest  tubes and lines were discontinued in the evening of postoperative day 2 without complication.  The patient  began cardiac rehab on postoperative day  3 and has tolerated this well throughout the postoperative course.  The  remainder of the patient's postoperative course has progressed as expected.  He was transferred from the SICU to unit 2000 on postoperative day 4 without  complication.  The patient's postoperative anemia and thrombocytopenia have  continued to resolve.  On postoperative day 6, day prior to anticipated  discharge, the patient was without complaint.  Bowel function had returned.  He was afebrile and the vital signs were stable with a blood pressure of  152/72, heart rate of 79, O2 saturation  of 91% on room air.  The patient was  maintaining a normal sinus rhythm on telemetry.  The patient's weight was at  324 pounds and he remains volume-overloaded.  The patient has been actively  diuresed postoperatively.  On physical exam, cardiac is regular rate and  rhythm, lungs revealed scattered inspiratory wheeze bilaterally, the abdomen  is obese, soft and there are active bowel sounds auscultated in the 4  quadrants, the incisions are clean, dry and intact, and the extremities  reveal 1 to 2+ pitting edema in the bilateral lower extremities.  The  patient continues to progress postoperatively.  The patient's volume  overload, as previously stated, has been treated with Lasix postoperatively.  The patient will also be discharged home on Lasix.  The patient's blood  pressure has elevated at time and is being treated with Altace and  Lopressor.  The patient has continued to have elevated CBGs postoperatively  as well.  He has no history of diabetes mellitus and is not on any  medications currently.  Hemoglobin A1c would not be accurate at times due to  the volume of blood loss associated with the patient's surgery and  subsequent transfusion.  The patient will have a diabetes mellitus consult  with a diabetes coordinator for education and to facilitate a followup after  discharge.  The  patient's thrombocytopenia and postoperative anemia have  resolved at this time.  The patient is felt to be in stable condition and  pending morning round reevaluation, is anticipated to be discharged on the  morning of March 04, 2004.   LABORATORIES:  CBGs on March 02, 2004 -- 152 and 140.  CBC and BMP on March 03, 2004:  Hemoglobin 9.6, hematocrit 28.3, white count 11.9, platelets  210,000; sodium 135, potassium 4.2, BUN 10, creatinine 0.9, glucose 109.   CONDITION ON DISCHARGE:  Improved.   DISCHARGE INSTRUCTIONS:  1. Medications:     a. Aspirin 325 mg daily.     b. Toprol-XL 200 mg daily.     c. Altace 2.5 mg daily.     d. Lipitor 20 mg daily.     e. Lasix 40 mg daily.     f. K-Dur 20 mEq daily.  2. Pain management:  Tylox 1-2 p.o. q.4-6 h. p.r.n. pain.  3. Activity:  No driving, no lifting of more than 10 pounds and the patient     is to continue daily breathing and walking exercises. 4. Diet:  Low-salt, low-fat, carbohydrate-modified medium-calorie diet.  5. Wound care:  The patient may shower daily and clean the incisions with     soap and water.  If the incisions become red, swollen and drain, or if     the patient has a fever of 101 degrees Fahrenheit, he is to contact the     [facility] office at [phone].   FOLLOWUP APPOINTMENT:  1. Dr. Learta Codding, March 18, 2004 at 10:30 a.m.  The patient will have a     chest x-ray taken at that time.  2. Dr. Cornelius Moras on March 28, 2004 at 11:30 a.m.  The patient is to bring the     chest x-ray from Dr. Margarita Mail office to the appointment with Dr. Cornelius Moras.      Pecola Leisure, PA                      Salvatore Decent. Cornelius Moras, M.D.    AY/MEDQ  D:  03/03/2004  T:  03/04/2004  Job:  [id number]   cc:   Salvatore Decent. Cornelius Moras, M.D.  [address]  [place]  [place] [address]   Learta Codding, M.D. [facility]

## 2010-12-30 NOTE — Op Note (Signed)
[facility]  Patient:    [name], [name]                      MRN: [id number] Proc. Date: 04/21/00 Adm. Date:  [id number] Disc. Date: [id number] Attending:  Laqueta Jean                           Operative Report  PREOPERATIVE DIAGNOSES:  Left upper ureteral stone status post lithotripsy.  POSTOPERATIVE DIAGNOSES:  Left upper ureteral stone status post lithotripsy.  OPERATION PERFORMED:  Cystourethroscopy, left retrograde pyelogram, basket extraction of multiple left ureteral calculi (impacted).  SURGEON:  Dr. Patsi Sears.  ANESTHESIA:  General endotracheal, left double J catheter as well as Foley catheter.  PREPARATION:  After appropriate preanesthesia, the patient was brought to the operating room, placed on the operating table in dorsal supine position where general endotracheal anesthesia was introduced. He was then replaced in the dorsal lithotomy position where the pubis was prepped with Betadine solution and draped in the usual fashion.  DESCRIPTION OF PROCEDURE:  Cystourethroscopy was accomplished, and left retrograde pyelogram showed multiple stones in the left mid upper ureter. Ureteroscopy was accomplished, and basket extraction of multiple stones was accomplished. The largest stone was impacted at the very top of the ureter, and this was dragged through the ureter. Because of trauma to the ureter and bleeding, it was elected to place a double J catheter. The first double J catheter appeared to perforate the ureter, and therefore was removed and ureteroscopy was accomplished with xylocaine jelly placed in the ureter, and a second guidewire placed into the renal pelvis and double J catheter placed into the renal pelvis and then to the bladder. The patient tolerated the procedure well. Because of the extended length of cystoscopy, it was elected to place a Foley catheter. The patient will be admitted for continued evaluation. DD:   04/21/00 TD:  04/23/00 Job: [id number]

## 2010-12-30 NOTE — Cardiovascular Report (Signed)
NAME:  [name], [name]                         ACCOUNT NO.:  [id number]   MEDICAL RECORD NO.:  [id number]                   PATIENT TYPE:  INP   LOCATION:  2857                                 FACILITY:  [facility]   PHYSICIAN:  Charlies Constable, M.D. [facility]              DATE OF BIRTH:  03-May-1956   DATE OF PROCEDURE:  02/26/2004  DATE OF DISCHARGE:                              CARDIAC CATHETERIZATION   CLINICAL HISTORY:  [name] is 55 years old and has known coronary disease  with a totally occluded right coronary artery.  He had a previous  unsuccessful attempt at opening the right coronary artery.  A recent  Cardiolite scan which showed increased ischemia and Dr. Andee Lineman studied him  in the [facility] lab and was quite worried about his LAD lesion.  We brought him  back today for IVUS of the LAD with plans  for surgery if the lesion was  tight enough.   PROCEDURE:  The procedure was performed via the right femoral artery using  arterial sheath and 6 French preformed coronary catheters. A front wall  puncture was performed.  We took a picture of the right coronary artery with  diagnostic right and then used a Q4 6 [place] guiding catheter with side  holes for the left.  After completing the diagnostic studies and after  giving weight-adjusted heparin and performing an ACT of greater than 200  seconds and after IV nitroglycerin we passed a wire down the LAD and then  performed IVUS run with the Atlantis IVUS catheter.  After the pullback when  we were reviewing the films, the patient developed chest pain, bradycardia  and hypotension.  He then developed complete asystole and required external  cardiac massage and bagging.  We gave him atropine and his blood pressure  and pulse came back as we were making arrangements to put in a balloon pump.  His situation stabilized and then we did put in a balloon pump after doing a  distal aortogram.  He left the laboratory in stable condition.   RESULTS:  Left  main coronary artery:  The left main coronary was free of  significant disease.   Left anterior descending artery:  The left anterior descending artery gave  rise to a septal perforator and a large diagonal branch.  There was moderate  calcification in the proximal LAD and there was an ostial lesion extending  out 10-15 mm from the ostium.  In the LAO view it looked like there was a  filling defect distal to the ostium.  There was also a 30% lesion in the mid  LAD.   Circumflex artery:  The circumflex artery gave rise to a ramus branch and an  AV branch which terminated into two posterior lateral branches.  There is  40% narrowing in the first portion of the ramus branch.   Right coronary artery:  The right coronary artery was completely  occluded  proximally.  Distal right coronary artery give rise to two posterior  descending and two posterior lateral branches which fill via collaterals  from the left coronary artery.   LEFT VENTRICULOGRAM:  The left ventriculogram was not done, but the LV  function was normal by his recent cath in the [facility] lab.   The IVUS run showed that the distal reference lumen was 3.2.  This ostial  lesion was eccentric with some calcium and the area stenosis was about 50%.  However, there was lesion just downstream from the ostium which had  characteristics of thrombus which appeared porous and hypodense with slight  movement.   CONCLUSION:  1. Coronary artery disease with total occlusion of the proximal right     coronary artery, 80% stenosis of the ostium of the left anterior     descending, 40% stenosis of ramus branch of the circumflex artery and     normal LV function by prior study.  2. IVUS run to assess the left anterior descending with presence of thrombus     near the ostium of the left anterior descending.   RECOMMENDATIONS:  [name] lesion appears unstable even though it does  not appear critically tight with the presence of thrombus.  I have  consulted  with Dr. Cornelius Moras and we have placed an intraaortic balloon pump and plan to do  urgent/emergent surgery later today.                                               Charlies Constable, M.D. [facility]    BB/MEDQ  D:  02/26/2004  T:  02/26/2004  Job:  [id number]   cc:   Learta Codding, M.D. [facility]   Charlton Haws, M.D.

## 2010-12-30 NOTE — Op Note (Signed)
NAME:  ARAF, CLUGSTON                         ACCOUNT NO.:  [id number]   MEDICAL RECORD NO.:  [id number]                   PATIENT TYPE:  INP   LOCATION:  2304                                 FACILITY:  [facility]   PHYSICIAN:  Salvatore Decent. Cornelius Moras, M.D.              DATE OF BIRTH:  11-02-1955   DATE OF PROCEDURE:  02/26/2004  DATE OF DISCHARGE:                                 OPERATIVE REPORT   PREOPERATIVE DIAGNOSIS:  Three-vessel coronary artery disease with class IV  unstable angina and acute myocardial infarction with critical ostial  stenosis of the left anterior descending coronary artery.   POSTOPERATIVE DIAGNOSIS:  Three-vessel coronary artery disease with class IV  unstable angina and acute myocardial infarction with critical ostial  stenosis of the left anterior descending coronary artery.   PROCEDURE:  Emergency median sternotomy for coronary artery bypass grafting  x3 (left internal mammary artery to distal left anterior descending coronary  artery, saphenous vein graft to ramus intermediate branch, saphenous vein  graft to posterior descending coronary artery, endoscopic saphenous vein  harvest from left thigh).   SURGEON:  Salvatore Decent. Cornelius Moras, M.D.   ASSISTANT:  Pecola Leisure, PA   ANESTHESIA:  General.   BRIEF CLINICAL NOTE:  The patient is a 55 year old extremely obese white  male with known history of coronary artery disease, hypertension,  hyperlipidemia, ongoing tobacco abuse, and obstructive sleep apnea.  The  patient described progressive symptoms of chest pain consistent with angina.  He underwent an outpatient stress Cardiolite exam that was abnormal,  prompting elective cardiac catheterization.  This was performed in early  July and findings were notable for ostial stenosis of the left anterior  descending coronary artery and chronically occluded right coronary artery,  as well as some moderate disease in the large ramus intermediate branch.  Left ventricular  function was preserved.  The patient was brought back for  elective repeat catheterization for intravascular ultrasound of the left  anterior descending coronary artery to further assess the significance of  the ostial stenosis of the left anterior descending coronary artery.  During  the week prior to this catheterization, the patient developed significant  progressive symptoms of chest pain and according to the patient's family, he  had suffered a prolonged episode of severe chest pain at rest 5 or 6 days  previously.  He was taken to the cardiac cath lab on the morning of February 26, 2004, where a repeat coronary arteriography was performed, followed by  intravascular ultrasound of the left anterior descending coronary artery.  The patient developed severe hemodynamic instability consistent with acute  myocardial infarction and acute coronary ischemia.  This required intra-  aortic balloon pump placement for stabilization.  Emergent cardiac surgical  consultation was requested.  The patient stabilized after intra-aortic  balloon pump placement and his blood pressure normalized and his chest pain  resolved.  The patient was subsequently brought directly to  the operating  room for emergent surgical revascularization.   OPERATIVE NOTE IN DETAIL:  The patient is brought directly from the cardiac  cath lab to the operating room suite on the afternoon of February 26, 2004.  The  patient is placed in a supine position on the operating table.  Central  venous monitoring is established by the anesthesia service under the care  and direction of Guadalupe Maple.  Specifically, a Swan-Ganz catheter is  placed through a right internal jugular approach.  A radial arterial line is  placed.  Intravenous antibiotics are administered.  Following induction with  general endotracheal anesthesia, a Foley catheter is placed.   Baseline transesophageal echocardiogram is performed by Dr. Noreene Larsson.  This  demonstrates  mild left ventricular dysfunction with overall ejection  fraction estimated 50%.  This is evaluated while the patient remains with  intra-aortic balloon pump in place at a counterpulsation rate of 1:1.  However, the left ventricular function appears reasonably good, although  there is some mild hypokinesis involving the distal anterior wall.  There is  no mitral regurgitation and no other significant abnormalities are  identified.   The patient's chest, abdomen, both lower extremities are prepared and draped  in a sterile manner.  Endoscopic saphenous vein harvest is obtained and the  greater saphenous vein is removed from the patient's left thigh through a  small incision made just above the left knee.  The saphenous vein is notably  a good-quality conduit.  After the saphenous vein is removed, the small  surgical intervention is closed in multiple layers in routine fashion and  the skin is closed with a subcuticular skin closure.  A median sternotomy  incision is performed and the left internal mammary artery is dissected from  the chest wall and prepared for bypass grafting.  The left internal mammary  artery is notably a good-quality conduit.  Of note, there are severe  adhesions surrounding the left lung and the left pleural space appears to be  entirely obliterated.  Therefore, the left internal mammary artery is  harvested extrapleurally.  The patient is heparinized systemically.   The pericardium is opened.  The ascending aorta is normal in appearance.  The heart is quite large, although this corresponds to the patient's body  habitus.  There are no obvious structural abnormalities appreciated other  than biventricular enlargement and mild left ventricular hypertrophy.  The  ascending aorta and the right atrium are cannulated for cardiopulmonary  bypass.  Adequate heparinization is verified.  Cardiopulmonary bypass is begun and the surface of the heart is inspected.  Distal  sites are selected for coronary bypass grafting.  Portions of  saphenous vein and the left internal mammary artery are trimmed to  appropriate lengths.  A temperature probe is placed in the left ventricular  septum.  A cardioplegia catheter is placed in the ascending aorta.   The patient is cooled to 32 degrees systemic temperature.  The aortic cross-  clamp is applied and cardioplegia is delivered initially in an antegrade  fashion through the aortic root.  Iced saline slush is applied for topical  hypothermia.  During the initial arresting dose of cardioplegia, the  cardioplegia delivery device appears to continue to falter and the rate of  administration of cardioplegia is much slower than normal.  This results in  relatively slow cooling of the myocardium.  Ultimately, the problem related  to the cardioplegia device is adequately trouble-shot and cardioplegia is  delivered and excellent myocardial  cooling is achieved.  During this portion  of the procedure, a retrograde cardioplegia catheter is also placed and  additional cardioplegia is administered retrograde to the coronary sinus  catheter.  Ultimately, excellent myocardial cooling is achieved with septal  temperature measured less than 12-degrees centigrade and complete diastolic  arrest achieved.  Repeat doses of cardioplegia are subsequently administered  throughout the remainder of the cross-clamp portion of the operation,  antegrade through the aortic root, antegrade down the subsequently placed  vein grafts, and retrograde through the coronary sinus catheter to maintain  satisfactory myocardial cooling below 50 degrees centigrade temperature.  The following distal coronary anastomoses are performed:  1) The posterior  descending coronary artery is grafted with a saphenous vein graft in an end-  to-side fashion.  This anastomosis is placed just beyond the bifurcation of  the distal right coronary artery.  The right coronary  artery is chronically  occluded and the distal right coronary artery is diffusely diseased.  The  posterior descending coronary artery measures 1.5 mm in diameter at the site  of distal bypass and at that area, it is of fairly good quality.  However,  this vessel was also quite diffusely diseased distally and a 1.0 probe only  passed partway down the posterior wall.  The posterior descending coronary  artery appears to be the only major branch off the distal right coronary  artery.  2) The ramus intermediate branch is grafted with a saphenous vein  graft in an end-to-side fashion.  This coronary artery is intramyocardial.  It is very large, measuring in excess of 2.2 mm in diameter.  At the site of  distal bypass, it is of good quality.  Proximally, it is diffusely diseased.  3) The distal left anterior descending coronary artery is grafted with the left internal mammary artery in an end-to-side fashion.  This coronary  artery is diffusely diseased proximally.  At the site of distal bypass, it  measures 1.5 mm in diameter and is good quality.   Both proximal saphenous vein anastomoses are performed directly to the  ascending aorta prior to removal of the aortic cross-clamp.  The septal  temperature is noted to rise rapidly upon reperfusion of the left internal  mammary artery.  One final dose of warm retrograde hot-shot cardioplegia is  administered.  The aortic cross-clamp is removed after a total cross-clamp  time of 108 minutes.   The heart is defibrillated.  Normal sinus rhythm resumes spontaneously.  Epicardial pacing wires are affixed to the right ventricular outflow tract  and to the right atrial appendage.  The patient is rewarmed to 37 degrees  centigrade temperature.  An initial attempt at weaning from the  cardiopulmonary bypass is unsuccessful and low-dose dopamine is begun.  Within less than 5 minutes, the heart develops very vigorous contractility.  The patient is weaned  from cardiopulmonary bypass without difficulty.  The  patient's rhythm at separation from bypass is normal sinus rhythm.  Total  cardiopulmonary bypass time for the operation is 138 minutes.   Transesophageal echocardiogram performed after separation from bypass  demonstrates preserved left ventricular function which may be improved in  comparison with preoperatively.  There remains slight hypokinesis of the  distal anterior wall.  No other abnormalities are noted.   The venous and arterial cannulae are removed uneventfully.  Protamine is  administered to reverse the anticoagulation.  The mediastinum is irrigated  with saline solution containing vancomycin antibiotic.  Meticulous surgical  hemostasis is ascertained.  The mediastinum is drained using 2 chest tubes  and a 28-French Blake drain exited through separate stab incisions  inferiorly.  The sternum is closed with double-strength sternal wire.  The  soft tissues anterior to the sternum are closed in multiple layers and the  skin is closed with a running subcuticular skin closure.   The patient tolerated the procedure well and is transported to the surgical  intensive care unit in stable condition.  There were no intraoperative  complications.  All sponge, instrument and needle counts are verified  correct at completion of the operation.  No blood products were  administered.                                               Salvatore Decent. Cornelius Moras, M.D.    CHO/MEDQ  D:  02/26/2004  T:  02/27/2004  Job:  [id number]   cc:   Charlton Haws, M.D.   Charlies Constable, M.D. [facility]   Molly Maduro D. Arlyce Dice, M.D. [facility]

## 2010-12-30 NOTE — H&P (Signed)
[facility]  Patient:    [name], [name]                      MRN: [id number] Adm. Date:  [id number] Disc. Date: [id number] Attending:  Laqueta Jean CC:         Noralyn Pick. Eden Emms, M.D. [facility]   History and Physical  HISTORY:  Mr. Hengst is a 55 year old white male with left flank pain and left lower quadrant pain since a lithotripsy two weeks ago.  KUB earlier in the week showed multiple stones in the left ureter, but these have become nonprogressive and appear to be impacted with left ureteral colic.  The patient is admitted on September 7 for observation.  The patient was taken to the operating room for basket extraction for multiple impacted stones and will be kept for further observation necessitating dictated admission.  PAST MEDICAL HISTORY: 1. ASVD, status post cardiac cath for three-vessel disease. 2. GERD.  ALLERGIES:  None known.  MEDICATIONS: 1. Toprol XL 50 mg 1 per day. 2. Imdur 60 mg 1 per day.  HABITS:  Tobacco and alcohol are none.  PHYSICAL EXAMINATION:  GENERAL:  Well-developed, well-nourished white male in no acute distress (post medication).  VITAL SIGNS:  Pending.  HEENT:  PERRL.  EOMs full.  NECK:  Supple, nontender and no nodes.  CHEST:  Clear to P&A.  ABDOMEN:  Soft decreased bowel sounds without organomegaly and without masses. There is left CVA pain and left lower quadrant pain.  GENITALIA:  Normal male external genitalia.  Penis is normal, circumcised.  EXTREMITIES:  Without cyanosis or edema.  NEUROLOGIC:  Physiologic.  ADMITTING IMPRESSION:  Impacted stones status post lithotripsy of left renal pelvic stones.  He now has multiple impacted left upper ureteral stones. DD:  04/21/00 TD:  04/21/00 Job: [id number]

## 2010-12-30 NOTE — Op Note (Signed)
NAME:  [name], [name]                         ACCOUNT NO.:  [id number]   MEDICAL RECORD NO.:  [id number]                   PATIENT TYPE:  INP   LOCATION:  2304                                 FACILITY:  [facility]   PHYSICIAN:  Salvatore Decent. Cornelius Moras, M.D.              DATE OF BIRTH:  Jun 09, 1956   DATE OF PROCEDURE:  02/27/2004  DATE OF DISCHARGE:                                 OPERATIVE REPORT   PREOPERATIVE DIAGNOSIS:  Bleeding, status post coronary artery bypass  grafting.   POSTOPERATIVE DIAGNOSIS:  Bleeding, status post coronary artery bypass  grafting.   PROCEDURE:  Reexploration for bleeding.   SURGEON:  Salvatore Decent. Cornelius Moras, M.D.   ASSISTANT:  Ms. Teryl Lucy, RNFA.   ANESTHESIA:  General.   CLINICAL NOTE:  Patient is a 55 year old obese male who underwent emergency  coronary artery bypass grafting x3 on February 26, 2004.  His initial  postoperative recovery was entirely uncomplicated.  He was noted to have  minimal chest tube output overnight, and he was extubated uneventfully and  remained entirely hemodynamically stable.  In the early morning of July  16th, the patient was partially sat up in bed and given breathing treatments  and coughed.  He had sudden onset of a large amount of chest tube output  which appeared dark.  Associated with this, the patient developed  hypotension.  His hypotension was successfully treated by volume  administration.  Due to the volume of the chest tube output and the  possibility of ongoing significant blood loss, emergency mediastinum  reexploration is felt indicated.  The patient is awake and alert and  understands the need to proceed to surgery.  His family has been notified.   OPERATIVE NOTE IN DETAIL:  The patient is brought back to the operating room  in the early morning hours of February 27, 2004.  He was intubated in the  intensive care unit prior to transport.  He was brought to the operating  room and placed in a supine position on the  operating room table.  Blood  products were administered, and his hemodynamics stabilized with aggressive  volume resuscitation.  General endotracheal anesthesia is induced, and  adequate analgesia verified.  The patient's existing chest tubes are  removed, and the anterior chest, abdomen, and both lower extremities are  prepared and draped in a sterile manner.  Intravenous antibiotics are  administered.  The patient's previous sternotomy incision is reopened  sharply.  The sternal wires are removed.  The mediastinum is explored.  There is actually a very modest amount of residual clot in the mediastinum.  All of the residual clot is irrigated and evacuated.  There is no sign of  active bleeding whatsoever.  An exhaustive search for any possible sign of  mechanical bleeding is performed.  All of the previous bypass grafts as well  as any other possible locations for mechanical bleeding are carefully  visualized.  No signs of active bleeding are encountered.  The mediastinum  is copiously irrigated with saline solution containing vancomycin.  The  patient remained entirely hemodynamically stable after he was adequately  resuscitated from his initial blood loss.  After definitively establishing  the absence of any possible ongoing bleeding, the mediastinum is again  drained using three chest tubes exited through separate stab incisions  inferiorly.  The median sternotomy is reclosed with double-strength sternal  wire.  The soft tissues anterior to the sternum are closed in multiple  layers, and the skin is closed with a running subcuticular skin closure.  The patient tolerated the procedure well and remained hemodynamically stable  after his initial resuscitation.  He is  brought back to the intensive care unit in stable condition.  There are no  intraoperative complications.  All sponge counts were correct.  Instrument  count was not verified prior to surgery, so a chest x-ray was performed  in  the operating room and documented the absence of any foreign bodies prior to  transfer back to the intensive care unit.                                               Salvatore Decent. Cornelius Moras, M.D.    CHO/MEDQ  D:  02/27/2004  T:  02/27/2004  Job:  [id number]

## 2010-12-30 NOTE — Op Note (Signed)
NAME:  KYRIAKOS, BABLER                         ACCOUNT NO.:  [id number]   MEDICAL RECORD NO.:  [id number]                   PATIENT TYPE:  INP   LOCATION:  2304                                 FACILITY:  [facility]   PHYSICIAN:  Guadalupe Maple, M.D.               DATE OF BIRTH:  [birth year]   DATE OF PROCEDURE:  DATE OF DISCHARGE:                                 OPERATIVE REPORT   DATE OF PROCEDURE:  February 26, 2004.   PROCEDURE:  Intraoperative transesophageal echocardiography.   Mr. Helmer Dull is a 55 year old white male noted to have coronary artery  disease, who underwent an attempted angioplasty of the left anterior  descending coronary artery, which was unsuccessful.  He was then placed on  an intraaortic balloon pump and taken to the operating room for emergent  coronary artery bypass grafting.  Intraoperative transesophageal  echocardiography was requested to evaluate the left ventricular function and  to determine if any valvular pathology was present.   The patient was brought to the operating room at [facility] and  general anesthesia was induced without difficulty.  The trachea was then  intubated without difficulty.   IMPRESSION/PRE-BYPASS FINDINGS:  1. Left ventricle showed there was severe hypokinesis of the anterior wall     and anterior septum.  The lateral wall, inferior wall, and inferior     septum showed what appeared to be normal contractility with an ejection     fraction estimated at 50%.  There was no thrombus noted in the left     ventricular apex.  2. The mitral valve appeared normal.  The leaflets opened normally.  They     were thin and pliable, coapted well with trace mitral insufficiency.  3. The aortic valve was normal.  The leaflets were thin, opened normally     without aortic insufficiency.  4. The right ventricular function showed good contractility of the right     ventricular free wall.  5. The tricuspid valve showed trace tricuspid  insufficiency.  6. The interatrial septum was intact without evidence of patent foramen     ovale or atrioseptal defect.  7. The left atrium appeared to be within normal limits of size.  There was     no evidence of thrombus in the left atrium or left atrial appendage.  8. The proximal ascending aorta appeared to be free of significant     atheromatous disease.  9. The descending aorta showed mild to moderate atheromatous disease, and     there was an intraaortic balloon pump noted.   POST-BYPASS FINDINGS:  1. Left ventricular function again showed an ejection fraction of     approximately 50% with hypokinesis of the anterior wall and anterior     septum, which appears somewhat improved from the pre-bypass study. There     was good contractility noted of the lateral wall, inferior wall, and  inferior septum.  2. The mitral valve again appeared normal with trace mitral insufficiency     and good coaptation of the leaflets with no evidence of prolapse or     fluttering.  3. The aortic valve appeared normal, which was unchanged from the pre-bypass     study.                                               Guadalupe Maple, M.D.    DCJ/MEDQ  D:  02/26/2004  T:  02/26/2004  Job:  [id number]   cc:   Guadalupe Maple, M.D.  [facility] [address]  [place]  [place] [address]  Fax: [phone]

## 2010-12-30 NOTE — Cardiovascular Report (Signed)
NAME:  [name], [name] NO.:  [id number]   MEDICAL RECORD NO.:  [id number]                   PATIENT TYPE:  OIB   LOCATION:  6501                                 FACILITY:  [facility]   PHYSICIAN:  Learta Codding, M.D. [facility]             DATE OF BIRTH:  08/15/1955   DATE OF PROCEDURE:  DATE OF DISCHARGE:  02/12/2004                              CARDIAC CATHETERIZATION   REFERRING PHYSICIAN:  Dr/ Barbette Hair. Arlyce Dice.   CARDIOLOGIST:  Dr. Charlton Haws, M.D.   PROCEDURES PERFORMED:  1. Left-heart catheterization with selective coronary angiography.  2. Ventriculography.   DIAGNOSES:  1. Two-vessel coronary artery disease.  2. Normal left ventricular systolic function with ejection fraction of 55%.  3. Occluded right coronary artery.   INDICATION:  The patient is a 55 year old white male with a history of known  coronary artery disease status post occluded RCA.  An attempt was made to  dilate the right coronary artery in the past but was unsuccessful.  The  patient had mild, nonobstructive coronary disease of the circumflex and LAD.  He recently underwent a Cardiolite study despite having no chest pain  symptoms.  He was found to have increased inferior ischemia by Cardiolite.  He also continues to smoke.  Otherwise, the patient has had no new symptoms  of shortness of breath.  He is now referred for diagnostic catheterization  to reassess coronary anatomy particularly with attention to the left  anterior descending coronary artery.   DESCRIPTION OF PROCEDURE:  After informed consent was obtained, the patient  was brought to the catheterization laboratory.  The right groin was  sterilely prepped and draped.  A 4-French arterial sheath was initially  placed using the modified Seldinger technique.  It subsequently was upgrade  to a 5-French arterial sheath exchanged over a guidewire.  Subsequently, 4-  Jamaica JL-4 and JR-4 catheters were used for coronary  angiography.  A 4-  French angled pigtail catheter was used for ventriculography.  The catheters  were upgraded to 5-French JL-4 to better assess the proximal LAD lesion.  Despite this, visualization was poor due to the large size of this patient.  At termination of the procedures, all catheters and sheaths were removed,  and no complications were encountered.   FINDINGS:   HEMODYNAMICS:  1. Left ventricular pressure was 152/10 mmHg.  2. Aortic pressure 152/87 mmHg.   VENTRICULOGRAPHY:  Ejection fraction 55%.  No mitral regurgitation.  A small  area of inferior hypokinesis.   SELECTIVE CORONARY ANGIOGRAPHY:  1. The left main coronary artery is a large-caliber vessel with no evidence     of flow-limiting disease.  2. The left anterior descending artery was a large-caliber vessel with     diffuse lesion in the proximal vessel.  Towards the ostium of the LAD, it     appeared to have a proximal 70% stenosis.  The remainder of the lesion  was approximately 50%.  There was also a mid LAD lesion of approximately     30% .  The diagonal branch was free of flow-limiting disease.  Of note     was that multiple projections were obtained of the LAD, and on several     views, there was streaming noted in the proximal LAD.  However, it did     appear that on the lateral view, the lesion was approximately 70%.  3. Ramus is moderate in size with 40 to 50% mid vessel lesion.  4. The circumflex proper had approximately 40 to 50% stenosis, and the     second obtuse marginal branch had 30 to 40% stenosis.  5. The right coronary artery was occluded.  Collaterals were seen     predominantly from the marginal circumflex branches to the distal right     coronary artery.  There were some collaterals from septal perforators.   RECOMMENDATIONS:  Angiographic images were reviewed with Dr. Juanda Chance.  The  proximal LAD lesion was difficult to visualize despite using eventually 5-  Jamaica catheters.  It was  decided that the patient would come back next week  and be done in our inpatient lab.  We will then use 6-French catheters and  consider IVUS to further evaluate the proximal LAD lesion.  I will schedule  this procedure with Dr. Emilie Rutter Pulsipher, after discussing this will Dr.  Juanda Chance.                                               Learta Codding, M.D. [facility]    GED/MEDQ  D:  02/12/2004  T:  02/14/2004  Job:  [id number]   cc:   Barbette Hair. Arlyce Dice, M.D. [facility]

## 2011-05-12 LAB — URINALYSIS, ROUTINE W REFLEX MICROSCOPIC
Glucose, UA: NEGATIVE
Hgb urine dipstick: NEGATIVE
Specific Gravity, Urine: 1.011

## 2011-05-12 LAB — CBC
Hemoglobin: 15.6
MCHC: 34.3
Platelets: 132 — ABNORMAL LOW
RBC: 3.68 — ABNORMAL LOW
RDW: 13.3
RDW: 13.3

## 2011-05-12 LAB — TYPE AND SCREEN
ABO/RH(D): O POS
Antibody Screen: NEGATIVE

## 2011-05-12 LAB — BLOOD GAS, ARTERIAL
Acid-Base Excess: 5.1 — ABNORMAL HIGH
Bicarbonate: 29.5 — ABNORMAL HIGH
FIO2: 0.21
O2 Saturation: 96.1
pO2, Arterial: 76.2 — ABNORMAL LOW

## 2011-05-12 LAB — COMPREHENSIVE METABOLIC PANEL
ALT: 40
AST: 26
Albumin: 4.1
Alkaline Phosphatase: 75
GFR calc Af Amer: >60
Potassium: 4.3
Sodium: 134 — ABNORMAL LOW
Total Protein: 6.7

## 2011-05-12 LAB — BASIC METABOLIC PANEL
CO2: 27
Calcium: 8.2 — ABNORMAL LOW
Chloride: 102
Creatinine, Ser: 0.8
Glucose, Bld: 116 — ABNORMAL HIGH
Potassium: 4
Sodium: 137

## 2011-05-12 LAB — ABO/RH: ABO/RH(D): O POS

## 2011-05-12 LAB — POCT I-STAT 4, (NA,K, GLUC, HGB,HCT)
Hemoglobin: 14.3
Sodium: 135

## 2011-06-06 ENCOUNTER — Other Ambulatory Visit: Payer: Self-pay | Admitting: *Deleted

## 2011-06-06 MED ORDER — FUROSEMIDE 80 MG PO TABS: 80.0000 mg | ORAL_TABLET | Freq: Every day | ORAL | Status: DC

## 2011-07-03 ENCOUNTER — Other Ambulatory Visit: Payer: Self-pay

## 2011-07-03 MED ORDER — POTASSIUM CHLORIDE CRYS ER 20 MEQ PO TBCR: 20.0000 meq | EXTENDED_RELEASE_TABLET | Freq: Every day | ORAL | Status: DC

## 2011-08-29 ENCOUNTER — Other Ambulatory Visit: Payer: Self-pay | Admitting: Cardiovascular Disease

## 2011-08-29 MED ORDER — SIMVASTATIN 10 MG PO TABS: 10.0000 mg | ORAL_TABLET | Freq: Every day | ORAL | Status: DC

## 2011-09-08 ENCOUNTER — Ambulatory Visit (INDEPENDENT_AMBULATORY_CARE_PROVIDER_SITE_OTHER): Payer: BC Managed Care – PPO | Admitting: Cardiovascular Disease

## 2011-09-08 ENCOUNTER — Encounter: Payer: Self-pay | Admitting: Cardiovascular Disease

## 2011-09-08 ENCOUNTER — Ambulatory Visit: Payer: BC Managed Care – PPO | Admitting: Cardiovascular Disease

## 2011-09-08 DIAGNOSIS — R0609 Other forms of dyspnea: Secondary | ICD-10-CM

## 2011-09-08 DIAGNOSIS — R0600 Dyspnea, unspecified: Secondary | ICD-10-CM

## 2011-09-08 DIAGNOSIS — F17200 Nicotine dependence, unspecified, uncomplicated: Secondary | ICD-10-CM

## 2011-09-08 DIAGNOSIS — R0989 Other specified symptoms and signs involving the circulatory and respiratory systems: Secondary | ICD-10-CM

## 2011-09-08 DIAGNOSIS — R079 Chest pain, unspecified: Secondary | ICD-10-CM

## 2011-09-08 DIAGNOSIS — Z79899 Other long term (current) drug therapy: Secondary | ICD-10-CM

## 2011-09-08 DIAGNOSIS — Z720 Tobacco use: Secondary | ICD-10-CM

## 2011-09-08 DIAGNOSIS — Z0000 Encounter for general adult medical examination without abnormal findings: Secondary | ICD-10-CM

## 2011-09-08 DIAGNOSIS — E785 Hyperlipidemia, unspecified: Secondary | ICD-10-CM

## 2011-09-08 NOTE — Assessment & Plan Note (Signed)
Continue statin  Check lipid and liver profile

## 2011-09-08 NOTE — Assessment & Plan Note (Signed)
Well controlled.  Continue current medications and low sodium Dash type diet.    

## 2011-09-08 NOTE — Assessment & Plan Note (Signed)
Not clear if dyspnea is related to ischemia.  Two day lexiscan.  Continue ASA

## 2011-09-08 NOTE — Patient Instructions (Signed)
Your physician recommends that you schedule a follow-up appointment in: AFTER TEST DONE Your physician recommends that you continue on your current medications as directed. Please refer to the Current Medication list given to you today. Your physician has requested that you have an echocardiogram. Echocardiography is a painless test that uses sound waves to create images of your heart. It provides your doctor with information about the size and shape of your heart and how well your heart's chambers and valves are working. This procedure takes approximately one hour. There are no restrictions for this procedure. DX DYSPNEA Your physician has requested that you have a carotid duplex. This test is an ultrasound of the carotid arteries in your neck. It looks at blood flow through these arteries that supply the brain with blood. Allow one hour for this exam. There are no restrictions or special instructions. DX BRUIT Your physician has requested that you have a lexiscan myoview. For further information please visit [email or web address] Please follow instruction sheet, as given. DX CHEST PAIN A chest x-ray takes a picture of the organs and structures inside the chest, including the heart, lungs, and blood vessels. This test can show several things, including, whether the heart is enlarges; whether fluid is building up in the lungs; and whether pacemaker / defibrillator leads are still in place. DX DYSPNEA Your physician recommends that you return for lab work in: FASTING  BMET BNP  CBC LIPID LIVER  HGBA1C PSA  DX 272.4 V58.69  V70.0

## 2011-09-08 NOTE — Assessment & Plan Note (Signed)
Discussed low carb diet.  Needs A1c checked.  No an ideal candidate for bariatric surgery given vascular disease.  Has refferal to dietician

## 2011-09-08 NOTE — Progress Notes (Signed)
Patient ID: [name], male   DOB: 1955/12/27, 56 y.o.   MRN: [id number] [name] is seen today in followup for his vascular disease. has a history coronary bypass surgery in 2005. He had a left carotid endarterectomy by Dr. Roney Marion in August of 2009. 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 Apparently he had been taken back after his left carotid endarterectomy for large hematoma. 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 Unfortunately continues to smoke. I counseled him for less than 10 minutes regarding this. The most he has been able to stop smoking is  about 3 days. He's been on Chantix on 2 occasions. He mentioned he may try a hypnotists and I told him that this does work for some people. Long-term risks including need for revascularization of his carotids and coronaries were discussed. He has been compliant with his medications. Has had more SOB and some pressure after walking short distances.  Discussed his obesity at length.  He has not seen Dr Paulette Blanch or Daub in over two years and has not had any blood work.    Pressure in chest always associated with dyspnea.  Not like previous angina.  Had some sharper pain over the last month associated with URI and cough.  No fever sputum Or hemoptysis  ROS: Denies fever, malais, weight loss, blurry vision, decreased visual acuity, cough, sputum,  hemoptysis, pleuritic pain, palpitaitons, heartburn, abdominal pain, melena, lower extremity edema, claudication, or rash.  All other systems reviewed and negative  General: Affect appropriate Obese chronically ill male HEENT: normal Neck supple with no adenopathy JVP normal bilateral bruits no thyromegaly Lungs clear with no wheezing and good diaphragmatic motion Heart:  S1/S2 no murmur, no rub, gallop or click PMI normal Abdomen: benighn, BS positve, no tenderness, no AAA no bruit.  No HSM or HJR Distal pulses  intact with no bruits Plus one bilateral edema Neuro non-focal Skin warm and dry No muscular weakness   Current Outpatient Prescriptions  Medication Sig Dispense Refill  . aspirin 81 MG tablet Take 81 mg by mouth daily.        . furosemide (LASIX) 80 MG tablet Take 1 tablet (80 mg total) by mouth daily.  30 tablet  12  . metoprolol (TOPROL XL) 50 MG 24 hr tablet Take 50 mg by mouth 2 (two) times daily.        . potassium chloride SA (KLOR-CON M20) 20 MEQ tablet Take 1 tablet (20 mEq total) by mouth daily.  30 tablet  6  . simvastatin (ZOCOR) 10 MG tablet Take 1 tablet (10 mg total) by mouth at bedtime. Pt must keep OV on 09/08/2011 at 4:30pm for additional refills  15 tablet  0  . albuterol (PROVENTIL,VENTOLIN) 90 MCG/ACT inhaler Inhale 2 puffs into the lungs every 6 (six) hours as needed for wheezing.  17 g  6  . tiotropium (SPIRIVA HANDIHALER) 18 MCG inhalation capsule Place 1 capsule (18 mcg total) into inhaler and inhale daily.  30 capsule  12    Allergies  Heparin  Electrocardiogram:  02/12/09  NSR rate 72 ICRBBB  Assessment and Plan

## 2011-09-08 NOTE — Assessment & Plan Note (Signed)
Dyspnea much worse.  Check BNP, CXR and echo for RV/LV function

## 2011-09-08 NOTE — Assessment & Plan Note (Signed)
Counseled for less than 10 minutes  Will try to use electronic cigarette again.

## 2011-09-08 NOTE — Assessment & Plan Note (Signed)
Bilateral bruits.  Previous right CEA  F/U US

## 2011-09-08 NOTE — Assessment & Plan Note (Signed)
Counseled for less than 10 minutes suggested trial electronic cigarette again.

## 2011-09-13 ENCOUNTER — Ambulatory Visit (INDEPENDENT_AMBULATORY_CARE_PROVIDER_SITE_OTHER)
Admission: RE | Admit: 2011-09-13 | Discharge: 2011-09-13 | Disposition: A | Discharge status: Home / Self Care | Payer: BC Managed Care – PPO | Source: Ambulatory Visit | Attending: Cardiovascular Disease | Admitting: Cardiovascular Disease

## 2011-09-13 ENCOUNTER — Other Ambulatory Visit (HOSPITAL_COMMUNITY): Payer: BC Managed Care – PPO | Admitting: Radiology

## 2011-09-13 DIAGNOSIS — R0989 Other specified symptoms and signs involving the circulatory and respiratory systems: Secondary | ICD-10-CM

## 2011-09-13 DIAGNOSIS — R0609 Other forms of dyspnea: Secondary | ICD-10-CM

## 2011-09-13 DIAGNOSIS — R0600 Dyspnea, unspecified: Secondary | ICD-10-CM

## 2011-09-15 ENCOUNTER — Ambulatory Visit (HOSPITAL_COMMUNITY): Payer: BC Managed Care – PPO | Attending: Internal Medicine

## 2011-09-15 ENCOUNTER — Other Ambulatory Visit (INDEPENDENT_AMBULATORY_CARE_PROVIDER_SITE_OTHER): Payer: BC Managed Care – PPO | Admitting: *Deleted

## 2011-09-15 DIAGNOSIS — E785 Hyperlipidemia, unspecified: Secondary | ICD-10-CM | POA: Insufficient documentation

## 2011-09-15 DIAGNOSIS — Z0000 Encounter for general adult medical examination without abnormal findings: Secondary | ICD-10-CM

## 2011-09-15 DIAGNOSIS — R0600 Dyspnea, unspecified: Secondary | ICD-10-CM

## 2011-09-15 DIAGNOSIS — I2510 Atherosclerotic heart disease of native coronary artery without angina pectoris: Secondary | ICD-10-CM | POA: Insufficient documentation

## 2011-09-15 DIAGNOSIS — F17200 Nicotine dependence, unspecified, uncomplicated: Secondary | ICD-10-CM | POA: Insufficient documentation

## 2011-09-15 DIAGNOSIS — J4489 Other specified chronic obstructive pulmonary disease: Secondary | ICD-10-CM | POA: Insufficient documentation

## 2011-09-15 DIAGNOSIS — E669 Obesity, unspecified: Secondary | ICD-10-CM | POA: Insufficient documentation

## 2011-09-15 DIAGNOSIS — R0609 Other forms of dyspnea: Secondary | ICD-10-CM

## 2011-09-15 DIAGNOSIS — J449 Chronic obstructive pulmonary disease, unspecified: Secondary | ICD-10-CM | POA: Insufficient documentation

## 2011-09-15 DIAGNOSIS — Z79899 Other long term (current) drug therapy: Secondary | ICD-10-CM

## 2011-09-15 DIAGNOSIS — R0989 Other specified symptoms and signs involving the circulatory and respiratory systems: Secondary | ICD-10-CM

## 2011-09-15 DIAGNOSIS — G4730 Sleep apnea, unspecified: Secondary | ICD-10-CM | POA: Insufficient documentation

## 2011-09-15 DIAGNOSIS — I10 Essential (primary) hypertension: Secondary | ICD-10-CM | POA: Insufficient documentation

## 2011-09-15 LAB — CBC WITH DIFFERENTIAL/PLATELET
Basophils Absolute: 0 10*3/uL (ref 0.0–0.1)
Lymphocytes Relative: 19.1 % (ref 12.0–46.0)
Lymphs Abs: 2 10*3/uL (ref 0.7–4.0)
Monocytes Relative: 8 % (ref 3.0–12.0)
Platelets: 130 10*3/uL — ABNORMAL LOW (ref 150.0–400.0)
RDW: 13.7 % (ref 11.5–14.6)

## 2011-09-15 LAB — HEPATIC FUNCTION PANEL: Albumin: 4.2 g/dL (ref 3.5–5.2)

## 2011-09-15 LAB — LIPID PANEL
Cholesterol: 143 mg/dL (ref 0–200)
HDL: 26 mg/dL — ABNORMAL LOW (ref >39.00)
VLDL: 24.4 mg/dL (ref 0.0–40.0)

## 2011-09-15 LAB — HEMOGLOBIN A1C: Hgb A1c MFr Bld: 6 % (ref 4.6–6.5)

## 2011-09-15 LAB — PSA: PSA: 0.23 ng/mL (ref 0.10–4.00)

## 2011-09-15 LAB — BASIC METABOLIC PANEL
BUN: 16 mg/dL (ref 6–23)
Calcium: 9.1 mg/dL (ref 8.4–10.5)
GFR: 104.19 mL/min (ref >60.00)
Glucose, Bld: 95 mg/dL (ref 70–99)

## 2011-09-15 LAB — BRAIN NATRIURETIC PEPTIDE: Pro B Natriuretic peptide (BNP): 78 pg/mL (ref 0.0–100.0)

## 2011-09-18 ENCOUNTER — Ambulatory Visit (HOSPITAL_COMMUNITY): Payer: BC Managed Care – PPO | Attending: Cardiovascular Disease | Admitting: Radiology

## 2011-09-18 DIAGNOSIS — R0609 Other forms of dyspnea: Secondary | ICD-10-CM | POA: Insufficient documentation

## 2011-09-18 DIAGNOSIS — R0602 Shortness of breath: Secondary | ICD-10-CM

## 2011-09-18 DIAGNOSIS — R002 Palpitations: Secondary | ICD-10-CM | POA: Insufficient documentation

## 2011-09-18 DIAGNOSIS — F17200 Nicotine dependence, unspecified, uncomplicated: Secondary | ICD-10-CM | POA: Insufficient documentation

## 2011-09-18 DIAGNOSIS — I10 Essential (primary) hypertension: Secondary | ICD-10-CM | POA: Insufficient documentation

## 2011-09-18 DIAGNOSIS — I2510 Atherosclerotic heart disease of native coronary artery without angina pectoris: Secondary | ICD-10-CM

## 2011-09-18 DIAGNOSIS — I779 Disorder of arteries and arterioles, unspecified: Secondary | ICD-10-CM | POA: Insufficient documentation

## 2011-09-18 DIAGNOSIS — R5381 Other malaise: Secondary | ICD-10-CM | POA: Insufficient documentation

## 2011-09-18 DIAGNOSIS — R0989 Other specified symptoms and signs involving the circulatory and respiratory systems: Secondary | ICD-10-CM | POA: Insufficient documentation

## 2011-09-18 DIAGNOSIS — Z951 Presence of aortocoronary bypass graft: Secondary | ICD-10-CM | POA: Insufficient documentation

## 2011-09-18 DIAGNOSIS — R079 Chest pain, unspecified: Secondary | ICD-10-CM

## 2011-09-18 DIAGNOSIS — R0789 Other chest pain: Secondary | ICD-10-CM | POA: Insufficient documentation

## 2011-09-18 DIAGNOSIS — R9431 Abnormal electrocardiogram [ECG] [EKG]: Secondary | ICD-10-CM

## 2011-09-18 DIAGNOSIS — E785 Hyperlipidemia, unspecified: Secondary | ICD-10-CM | POA: Insufficient documentation

## 2011-09-18 MED ORDER — REGADENOSON 0.4 MG/5ML IV SOLN
0.4000 mg | Freq: Once | INTRAVENOUS | Status: AC
  Administered 2011-09-18: 0.4 mg via INTRAVENOUS

## 2011-09-18 MED ORDER — TECHNETIUM TC 99M TETROFOSMIN IV KIT
33.0000 | PACK | Freq: Once | INTRAVENOUS | Status: AC | PRN
  Administered 2011-09-18: 33 via INTRAVENOUS

## 2011-09-18 NOTE — Progress Notes (Signed)
[facility] SITE 3 NUCLEAR MED [address] [place] [address] [phone]  Cardiology Nuclear Med Study  [name] is a 56 y.o. male [id number] October 11, 1955   Nuclear Med Background Indication for Stress Test:  Evaluation for Ischemia and Graft Patency History:  7/05 MPS: Abnormal with ischemia of inferior wall;EF=43%>Heart Catheterization:EF=55%>CABG,2010 Echo:EF=55-60% and moderate LVH and mild TR Cardiac Risk Factors: Carotid Disease, Hypertension, Lipids and Smoker  Symptoms:  Chest Pain/ Pressure/Tightness with Exertion (last date of chest discomfort 09/17/11), DOE, Fatigue and Palpitations   Nuclear Pre-Procedure Caffeine/Decaff Intake:  9:00pm NPO After: 7:30pm   Lungs:  clear IV 0.9% NS with Angio Cath:  22g  IV Site: R Forearm  IV Started by:  Cathlyn Parsons, RN  Chest Size (in):  60 Cup Size: n/a  Height: 6' (1.829 m)  Weight:  353 lb (160.12 kg)  BMI:  Body mass index is 47.88 kg/(m^2). Tech Comments:  Toprol held x 32 hrs    Nuclear Med Study 1 or 2 day study: 2 day  Stress Test Type:  Lexiscan  Reading MD: Olga Millers, MD  Order Authorizing Provider:  Burna Cash  Resting Radionuclide: Technetium 6m Tetrofosmin  Resting Radionuclide Dose: 33 mCi  09/19/11  Stress Radionuclide:  Technetium 16m Tetrofosmin  Stress Radionuclide Dose: 33 mCi 09/18/11           Stress Protocol Rest HR: 66 Stress HR: 82  Rest BP: 146/71 Stress BP: 161/64  Exercise Time (min): n/a METS: n/a   Predicted Max HR: 163 bpm % Max HR: 40.49 bpm Rate Pressure Product: 62952   Dose of Adenosine (mg):  n/a Dose of Lexiscan: 0.4 mg  Dose of Atropine (mg): n/a Dose of Dobutamine: n/a mcg/kg/min (at max HR)  Stress Test Technologist: Cathlyn Parsons, RN  Nuclear Technologist:  Domenic Polite, CNMT     Rest Procedure:  Myocardial perfusion imaging was performed at rest 45 minutes following the intravenous administration of Technetium 85m  Tetrofosmin. Rest ECG: NSR with IRBBB and  non-specific ST-T wave changes  Stress Procedure:  The patient received IV Lexiscan 0.4 mg over 15-seconds.  Technetium 57m Tetrofosmin injected at 30-seconds.  There were minimal nonspecific changes with Lexiscan from resting.  Patient had frequent PAC's. Quantitative spect images were obtained after a 45 minute delay. Stress ECG: No significant change from baseline ECG  QPS Raw Data Images:  There is no motion present. Stress Images:  There is moderately severe decreased uptake in the entire inferior wall. There is moderate decrease uptake in the lateral wall. Rest Images:  Moderately severe decreased uptake in the entire inferior wall. Slight decreased uptake in the lateral wall. Subtraction (SDS):  There is mild reversal in the inferior wall consistent with some ischemia. There is moderate reversal in the lateral wall compatible with moderate ischemia. Transient Ischemic Dilatation (Normal <1.22):  1.09 Lung/Heart Ratio (Normal <0.45):  .36  Quantitative Gated Spect Images QGS EDV:  252 ml QGS ESV:  135 ml QGS cine images:  There is mild inferior and septal hypokinesis QGS EF: 46%  Impression Exercise Capacity:  Lexiscan with no exercise. BP Response:  Normal blood pressure response. Clinical Symptoms:  Shortness of breath ECG Impression:  No significant ST segment change suggestive of ischemia. Comparison with Prior Nuclear Study: See the discussion below  Overall Impression:  The study is compared with the images of 2005. There is significant decreased activity in the inferior wall. There is slight ischemia. This finding is consistent  mostly with inferior scar. This area is similar to 2005. In addition there is moderate ischemia in the lateral wall. This was not present on the study of 2005.   Willa Rough, MD

## 2011-09-19 ENCOUNTER — Ambulatory Visit (HOSPITAL_COMMUNITY): Payer: BC Managed Care – PPO | Attending: Cardiology | Admitting: Radiology

## 2011-09-19 ENCOUNTER — Encounter (INDEPENDENT_AMBULATORY_CARE_PROVIDER_SITE_OTHER): Payer: BC Managed Care – PPO | Admitting: *Deleted

## 2011-09-19 ENCOUNTER — Other Ambulatory Visit: Payer: Self-pay | Admitting: Cardiovascular Disease

## 2011-09-19 DIAGNOSIS — R0989 Other specified symptoms and signs involving the circulatory and respiratory systems: Secondary | ICD-10-CM

## 2011-09-19 DIAGNOSIS — I6529 Occlusion and stenosis of unspecified carotid artery: Secondary | ICD-10-CM

## 2011-09-19 MED ORDER — TECHNETIUM TC 99M TETROFOSMIN IV KIT
30.0000 | PACK | Freq: Once | INTRAVENOUS | Status: AC | PRN
  Administered 2011-09-19: 30 via INTRAVENOUS

## 2011-09-19 MED ORDER — SIMVASTATIN 10 MG PO TABS: 10.0000 mg | ORAL_TABLET | Freq: Every day | ORAL | Status: DC

## 2011-09-21 ENCOUNTER — Telehealth: Payer: Self-pay | Admitting: Cardiovascular Disease

## 2011-09-21 ENCOUNTER — Encounter: Payer: Self-pay | Admitting: *Deleted

## 2011-09-21 DIAGNOSIS — R9431 Abnormal electrocardiogram [ECG] [EKG]: Secondary | ICD-10-CM

## 2011-09-21 DIAGNOSIS — Z01810 Encounter for preprocedural cardiovascular examination: Secondary | ICD-10-CM

## 2011-09-21 NOTE — Telephone Encounter (Signed)
PT AWARE OF MYOVIEW RESULTS  CATH SCHEDULED FOR 09-29-11 AT  8:30 WITH DR MCLEAN/CY PT COMING IN FOR  LABS  ON FRI 09-22-11 AND WILL LEAVE INSTRUCTIONS AT FRONT DESK FOR PICK UP .Zack Seal

## 2011-09-21 NOTE — Telephone Encounter (Signed)
Fu call °Patient returning your call °

## 2011-09-22 ENCOUNTER — Ambulatory Visit (INDEPENDENT_AMBULATORY_CARE_PROVIDER_SITE_OTHER): Payer: BC Managed Care – PPO | Admitting: Cardiovascular Disease

## 2011-09-22 ENCOUNTER — Encounter: Payer: Self-pay | Admitting: Cardiovascular Disease

## 2011-09-22 ENCOUNTER — Other Ambulatory Visit: Payer: BC Managed Care – PPO | Admitting: *Deleted

## 2011-09-22 ENCOUNTER — Other Ambulatory Visit: Payer: Self-pay | Admitting: Cardiovascular Disease

## 2011-09-22 DIAGNOSIS — I2510 Atherosclerotic heart disease of native coronary artery without angina pectoris: Secondary | ICD-10-CM

## 2011-09-22 DIAGNOSIS — E785 Hyperlipidemia, unspecified: Secondary | ICD-10-CM

## 2011-09-22 DIAGNOSIS — F17200 Nicotine dependence, unspecified, uncomplicated: Secondary | ICD-10-CM

## 2011-09-22 DIAGNOSIS — D7582 Heparin induced thrombocytopenia (HIT): Secondary | ICD-10-CM

## 2011-09-22 DIAGNOSIS — D75829 Heparin-induced thrombocytopenia, unspecified: Secondary | ICD-10-CM

## 2011-09-22 DIAGNOSIS — I10 Essential (primary) hypertension: Secondary | ICD-10-CM

## 2011-09-22 DIAGNOSIS — R0989 Other specified symptoms and signs involving the circulatory and respiratory systems: Secondary | ICD-10-CM

## 2011-09-22 NOTE — Patient Instructions (Addendum)
Your physician recommends that you continue on your current medications as directed. Please refer to the Current Medication list given to you today.  Your physician recommends that you return for lab work in: INR TODAY  VON WILLEBRAND  HYPER COAGUABLE  PANEL  DX V 72.81

## 2011-09-22 NOTE — Assessment & Plan Note (Signed)
He understands the issues with graft patency and smoking.  Does not seem motivated to quit.  Nicotine replacement if cath ok

## 2011-09-22 NOTE — Assessment & Plan Note (Signed)
Cholesterol is at goal.  Continue current dose of statin and diet Rx.  No myalgias or side effects.  F/U  LFT's in 6 months. Lab Results  Component Value Date   LDLCALC 93 09/15/2011             

## 2011-09-22 NOTE — Assessment & Plan Note (Signed)
Chest pain abnormal myovue Ongoing smoking  Cath next Friday

## 2011-09-22 NOTE — Assessment & Plan Note (Signed)
F/U duplex in a year no critical disease on duplex last week

## 2011-09-22 NOTE — Assessment & Plan Note (Signed)
Avoid heparin.  Use angiomax if intervention needed

## 2011-09-22 NOTE — Progress Notes (Signed)
Patient ID: [name], male   DOB: [birth year], 56 y.o.   MRN: [id number] Ronith is seen today in followup for his vascular disease. has a history coronary bypass surgery in 2005. He had a left carotid endarterectomy by Dr. Roney Marion in August of 2009. 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 Apparently he had been taken back after his left carotid endarterectomy for large hematoma. 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 Unfortunately continues to smoke. I counseled him for less than 10 minutes regarding this. The most he has been able to stop smoking is about 3 days. He's been on Chantix on 2 occasions. He mentioned he may try a hypnotists and I told him that this does work for some people. Long-term risks including need for revascularization of his carotids and coronaries were discussed. He has been compliant with his medications. Has had more SOB and some pressure after walking short distances. Discussed his obesity at length. He has not seen Dr Paulette Blanch or Daub in over two years and has not had any blood work.  Pressure in chest always associated with dyspnea. Not like previous angina. Had some sharper pain over the last month associated with URI and cough. No fever sputum  Or hemoptysis  F/U testing showed no critical carotid disease with patent left CEA Echo indicated normal EF However myovue showed EF 45% with inferolateral infarct and ischemia.  Had small basal lateral infarct last scan and this is more extensive  Discussed cath with patient.  Scheduled with Dr Jearld Pies next Friday.  He has had previous issues with heparin and "clotting"  Also bled after his CEA.  Not clear if he has hematologic issues.  Will check hypercoagulability and von willabrand  Factor in addition to regular labs.  If he needs intervention would use angiomax and avoid heparin  ROS: Denies fever, malais, weight loss, blurry  vision, decreased visual acuity, cough, sputum, SOB, hemoptysis, pleuritic pain, palpitaitons, heartburn, abdominal pain, melena, lower extremity edema, claudication, or rash.  All other systems reviewed and negative  General: Anxious  Obese white male HEENT: normal Neck supple with no adenopathy JVP normal left bruit wit hprevious CEA  no thyromegaly Lungs clear with no wheezing and good diaphragmatic motion Heart:  S1/S2 no murmur, no rub, gallop or click PMI normal Abdomen: benighn, BS positve, no tenderness, no AAA no bruit.  No HSM or HJR Distal pulses intact with no bruits No edema Neuro non-focal Skin warm and dry No muscular weakness   Current Outpatient Prescriptions  Medication Sig Dispense Refill  . aspirin 81 MG tablet Take 81 mg by mouth daily.        . furosemide (LASIX) 80 MG tablet Take 1 tablet (80 mg total) by mouth daily.  30 tablet  12  . metoprolol (TOPROL XL) 50 MG 24 hr tablet Take 50 mg by mouth 2 (two) times daily.        . potassium chloride SA (KLOR-CON M20) 20 MEQ tablet Take 1 tablet (20 mEq total) by mouth daily.  30 tablet  6  . simvastatin (ZOCOR) 10 MG tablet Take 1 tablet (10 mg total) by mouth at bedtime.  30 tablet  11  . albuterol (PROVENTIL,VENTOLIN) 90 MCG/ACT inhaler Inhale 2 puffs into the lungs every 6 (six) hours as needed for wheezing.  17 g  6  . tiotropium (SPIRIVA HANDIHALER) 18 MCG inhalation capsule Place 1 capsule (18 mcg total)  into inhaler and inhale daily.  30 capsule  12    Allergies  Heparin  Electrocardiogram:  02/12/09  NSR nonspecific inferior T changes  Assessment and Plan

## 2011-09-22 NOTE — Assessment & Plan Note (Signed)
Well controlled.  Continue current medications and low sodium Dash type diet.    

## 2011-09-23 LAB — PROTIME-INR
INR: 0.94 (ref <1.50)
Prothrombin Time: 13 seconds (ref 11.6–15.2)

## 2011-09-27 LAB — HYPERCOAGULABLE PANEL, COMPREHENSIVE RET.
AntiThromb III Func: 97 % (ref 76–126)
Anticardiolipin IgG: 10 GPL U/mL (ref <23)
Beta-2 Glyco I IgG: 2 G Units (ref <20)
Beta-2-Glycoprotein I IgA: 7 A Units (ref <20)
DRVVT: 35.4 secs (ref 34.1–42.2)
Homocysteine: 14 umol/L (ref 4.0–15.4)
PTT Lupus Anticoagulant: 35 secs (ref 28.0–43.0)
Protein S Total: 137 % (ref 60–150)

## 2011-09-27 LAB — VON WILLEBRAND FACTOR MULTIMER
Factor-VIII Activity: 106 % (ref 50–180)
Ristocetin Co-Factor: 128 % (ref 42–200)
Von Willebrand Factor Ag: 140 % (ref 50–217)

## 2011-09-29 ENCOUNTER — Encounter (HOSPITAL_COMMUNITY)
Admission: RE | Disposition: A | Discharge status: Home / Self Care | Payer: Self-pay | Source: Ambulatory Visit | Attending: Cardiovascular Disease

## 2011-09-29 ENCOUNTER — Encounter (HOSPITAL_COMMUNITY): Payer: Self-pay | Admitting: General Practice

## 2011-09-29 ENCOUNTER — Other Ambulatory Visit: Payer: Self-pay

## 2011-09-29 ENCOUNTER — Encounter (HOSPITAL_BASED_OUTPATIENT_CLINIC_OR_DEPARTMENT_OTHER)
Admission: RE | Disposition: A | Discharge status: Hospice | Payer: Self-pay | Source: Ambulatory Visit | Attending: Cardiology

## 2011-09-29 ENCOUNTER — Inpatient Hospital Stay (HOSPITAL_BASED_OUTPATIENT_CLINIC_OR_DEPARTMENT_OTHER)
Admission: RE | Admit: 2011-09-29 | Discharge: 2011-09-29 | Disposition: A | Discharge status: Hospice | Payer: BC Managed Care – PPO | Source: Ambulatory Visit | Attending: Cardiology | Admitting: Cardiology

## 2011-09-29 ENCOUNTER — Inpatient Hospital Stay (HOSPITAL_COMMUNITY)
Admission: RE | Admit: 2011-09-29 | Discharge: 2011-09-30 | DRG: 854 | Disposition: A | Discharge status: Home / Self Care | Payer: BC Managed Care – PPO | Source: Ambulatory Visit | Attending: Cardiovascular Disease | Admitting: Cardiovascular Disease

## 2011-09-29 DIAGNOSIS — Z23 Encounter for immunization: Secondary | ICD-10-CM

## 2011-09-29 DIAGNOSIS — M545 Low back pain, unspecified: Secondary | ICD-10-CM | POA: Diagnosis present

## 2011-09-29 DIAGNOSIS — I25810 Atherosclerosis of coronary artery bypass graft(s) without angina pectoris: Secondary | ICD-10-CM

## 2011-09-29 DIAGNOSIS — Z7982 Long term (current) use of aspirin: Secondary | ICD-10-CM

## 2011-09-29 DIAGNOSIS — J449 Chronic obstructive pulmonary disease, unspecified: Secondary | ICD-10-CM

## 2011-09-29 DIAGNOSIS — R079 Chest pain, unspecified: Secondary | ICD-10-CM | POA: Insufficient documentation

## 2011-09-29 DIAGNOSIS — Z7902 Long term (current) use of antithrombotics/antiplatelets: Secondary | ICD-10-CM

## 2011-09-29 DIAGNOSIS — G4730 Sleep apnea, unspecified: Secondary | ICD-10-CM | POA: Diagnosis present

## 2011-09-29 DIAGNOSIS — I2510 Atherosclerotic heart disease of native coronary artery without angina pectoris: Secondary | ICD-10-CM

## 2011-09-29 DIAGNOSIS — F17200 Nicotine dependence, unspecified, uncomplicated: Secondary | ICD-10-CM | POA: Insufficient documentation

## 2011-09-29 DIAGNOSIS — G8929 Other chronic pain: Secondary | ICD-10-CM | POA: Diagnosis present

## 2011-09-29 DIAGNOSIS — E669 Obesity, unspecified: Secondary | ICD-10-CM | POA: Diagnosis present

## 2011-09-29 DIAGNOSIS — Z85828 Personal history of other malignant neoplasm of skin: Secondary | ICD-10-CM

## 2011-09-29 DIAGNOSIS — I10 Essential (primary) hypertension: Secondary | ICD-10-CM | POA: Diagnosis present

## 2011-09-29 DIAGNOSIS — E785 Hyperlipidemia, unspecified: Secondary | ICD-10-CM | POA: Diagnosis present

## 2011-09-29 DIAGNOSIS — J4489 Other specified chronic obstructive pulmonary disease: Secondary | ICD-10-CM | POA: Diagnosis present

## 2011-09-29 DIAGNOSIS — R9439 Abnormal result of other cardiovascular function study: Secondary | ICD-10-CM | POA: Insufficient documentation

## 2011-09-29 DIAGNOSIS — R0602 Shortness of breath: Secondary | ICD-10-CM | POA: Insufficient documentation

## 2011-09-29 DIAGNOSIS — K219 Gastro-esophageal reflux disease without esophagitis: Secondary | ICD-10-CM | POA: Diagnosis present

## 2011-09-29 DIAGNOSIS — Z79899 Other long term (current) drug therapy: Secondary | ICD-10-CM

## 2011-09-29 HISTORY — DX: Basal cell carcinoma of skin of nose: C44.311

## 2011-09-29 HISTORY — DX: Other chronic pain: G89.29

## 2011-09-29 HISTORY — DX: Disorder of arteries and arterioles, unspecified: I77.9

## 2011-09-29 HISTORY — DX: Low back pain, unspecified: M54.50

## 2011-09-29 HISTORY — DX: Low back pain: M54.5

## 2011-09-29 HISTORY — PX: PERCUTANEOUS CORONARY STENT INTERVENTION (PCI-S): SHX5485

## 2011-09-29 HISTORY — PX: CORONARY ANGIOPLASTY WITH STENT PLACEMENT: SHX49

## 2011-09-29 HISTORY — DX: Peripheral vascular disease, unspecified: I73.9

## 2011-09-29 SURGERY — JV LEFT HEART CATHETERIZATION WITH CORONARY/GRAFT ANGIOGRAM
Anesthesia: Moderate Sedation

## 2011-09-29 SURGERY — PERCUTANEOUS CORONARY STENT INTERVENTION (PCI-S)
Anesthesia: LOCAL

## 2011-09-29 SURGERY — JV LEFT HEART CATHETERIZATION WITH CORONARY ANGIOGRAM
Anesthesia: Moderate Sedation

## 2011-09-29 MED ORDER — FAMOTIDINE IN NACL 20-0.9 MG/50ML-% IV SOLN
20.0000 mg | Freq: Two times a day (BID) | INTRAVENOUS | Status: DC
  Administered 2011-09-29: 20 mg via INTRAVENOUS

## 2011-09-29 MED ORDER — METOPROLOL SUCCINATE ER 100 MG PO TB24
100.0000 mg | ORAL_TABLET | Freq: Every day | ORAL | Status: DC
  Administered 2011-09-30: 100 mg via ORAL
  Filled 2011-09-29 (×2): qty 1

## 2011-09-29 MED ORDER — POTASSIUM CHLORIDE CRYS ER 20 MEQ PO TBCR
20.0000 meq | EXTENDED_RELEASE_TABLET | Freq: Every day | ORAL | Status: DC
  Administered 2011-09-29 – 2011-09-30 (×2): 20 meq via ORAL
  Filled 2011-09-29 (×2): qty 1

## 2011-09-29 MED ORDER — LIDOCAINE HCL (PF) 1 % IJ SOLN
INTRAMUSCULAR | Status: AC
  Filled 2011-09-29: qty 30

## 2011-09-29 MED ORDER — SODIUM CHLORIDE 0.9 % IV SOLN
INTRAVENOUS | Status: DC
  Administered 2011-09-29: 08:00:00 via INTRAVENOUS

## 2011-09-29 MED ORDER — INFLUENZA VIRUS VACC SPLIT PF IM SUSP
0.5000 mL | INTRAMUSCULAR | Status: AC
Start: 2011-09-30 — End: 2011-09-30
  Administered 2011-09-30: 0.5 mL via INTRAMUSCULAR
  Filled 2011-09-29: qty 0.5

## 2011-09-29 MED ORDER — ASPIRIN 81 MG PO CHEW
324.0000 mg | CHEWABLE_TABLET | ORAL | Status: AC
  Administered 2011-09-29: 324 mg via ORAL

## 2011-09-29 MED ORDER — SODIUM CHLORIDE 0.9 % IJ SOLN: 3.0000 mL | INTRAMUSCULAR | Status: DC | PRN

## 2011-09-29 MED ORDER — SODIUM CHLORIDE 0.9 % IV SOLN
INTRAVENOUS | Status: AC
  Administered 2011-09-29: 13:00:00 via INTRAVENOUS

## 2011-09-29 MED ORDER — FUROSEMIDE 80 MG PO TABS
80.0000 mg | ORAL_TABLET | Freq: Every day | ORAL | Status: DC
  Administered 2011-09-29 – 2011-09-30 (×2): 80 mg via ORAL
  Filled 2011-09-29 (×2): qty 1

## 2011-09-29 MED ORDER — ACETAMINOPHEN 325 MG PO TABS: 650.0000 mg | ORAL_TABLET | ORAL | Status: DC | PRN

## 2011-09-29 MED ORDER — TIOTROPIUM BROMIDE MONOHYDRATE 18 MCG IN CAPS
18.0000 ug | ORAL_CAPSULE | Freq: Every day | RESPIRATORY_TRACT | Status: DC
  Filled 2011-09-29: qty 5

## 2011-09-29 MED ORDER — ASPIRIN 81 MG PO TABS: 81.0000 mg | ORAL_TABLET | Freq: Every day | ORAL | Status: DC

## 2011-09-29 MED ORDER — ALBUTEROL 90 MCG/ACT IN AERS: 2.0000 | INHALATION_SPRAY | Freq: Four times a day (QID) | RESPIRATORY_TRACT | Status: DC | PRN

## 2011-09-29 MED ORDER — MIDAZOLAM HCL 2 MG/2ML IJ SOLN
INTRAMUSCULAR | Status: AC
  Filled 2011-09-29: qty 2

## 2011-09-29 MED ORDER — CLOPIDOGREL BISULFATE 75 MG PO TABS
75.0000 mg | ORAL_TABLET | Freq: Every day | ORAL | Status: DC
  Administered 2011-09-30: 75 mg via ORAL
  Filled 2011-09-29: qty 1

## 2011-09-29 MED ORDER — DIAZEPAM 5 MG PO TABS
5.0000 mg | ORAL_TABLET | ORAL | Status: AC
  Administered 2011-09-29: 5 mg via ORAL

## 2011-09-29 MED ORDER — ONDANSETRON HCL 4 MG/2ML IJ SOLN: 4.0000 mg | Freq: Four times a day (QID) | INTRAMUSCULAR | Status: DC | PRN

## 2011-09-29 MED ORDER — METOPROLOL SUCCINATE ER 100 MG PO TB24
100.0000 mg | ORAL_TABLET | Freq: Two times a day (BID) | ORAL | Status: DC
  Filled 2011-09-29 (×2): qty 1

## 2011-09-29 MED ORDER — FENTANYL CITRATE 0.05 MG/ML IJ SOLN
INTRAMUSCULAR | Status: AC
  Filled 2011-09-29: qty 2

## 2011-09-29 MED ORDER — SODIUM CHLORIDE 0.9 % IJ SOLN: 3.0000 mL | Freq: Two times a day (BID) | INTRAMUSCULAR | Status: DC

## 2011-09-29 MED ORDER — MORPHINE SULFATE 4 MG/ML IJ SOLN
4.0000 mg | INTRAMUSCULAR | Status: DC | PRN
  Administered 2011-09-29: 4 mg via INTRAVENOUS
  Filled 2011-09-29: qty 1

## 2011-09-29 MED ORDER — ASPIRIN 81 MG PO CHEW
81.0000 mg | CHEWABLE_TABLET | Freq: Every day | ORAL | Status: DC
  Administered 2011-09-30: 81 mg via ORAL
  Filled 2011-09-29: qty 1

## 2011-09-29 MED ORDER — METOPROLOL SUCCINATE ER 50 MG PO TB24: 50.0000 mg | ORAL_TABLET | Freq: Two times a day (BID) | ORAL | Status: DC

## 2011-09-29 MED ORDER — NITROGLYCERIN 0.2 MG/ML ON CALL CATH LAB
INTRAVENOUS | Status: AC
  Filled 2011-09-29: qty 1

## 2011-09-29 MED ORDER — ALBUTEROL SULFATE HFA 108 (90 BASE) MCG/ACT IN AERS
2.0000 | INHALATION_SPRAY | Freq: Four times a day (QID) | RESPIRATORY_TRACT | Status: DC | PRN
  Filled 2011-09-29: qty 6.7

## 2011-09-29 MED ORDER — BIVALIRUDIN 250 MG IV SOLR
INTRAVENOUS | Status: AC
  Filled 2011-09-29: qty 250

## 2011-09-29 MED ORDER — SODIUM CHLORIDE 0.9 % IV SOLN: 250.0000 mL | INTRAVENOUS | Status: DC | PRN

## 2011-09-29 MED ORDER — OXYCODONE-ACETAMINOPHEN 5-325 MG PO TABS
1.0000 | ORAL_TABLET | ORAL | Status: DC | PRN
  Administered 2011-09-29: 1 via ORAL
  Administered 2011-09-30: 2 via ORAL
  Filled 2011-09-29: qty 2
  Filled 2011-09-29: qty 1

## 2011-09-29 MED ORDER — SIMVASTATIN 10 MG PO TABS
10.0000 mg | ORAL_TABLET | Freq: Every day | ORAL | Status: DC
  Administered 2011-09-29: 10 mg via ORAL
  Filled 2011-09-29 (×2): qty 1

## 2011-09-29 NOTE — Progress Notes (Signed)
Pt tranferred to Cath Lab on second floor for PCI.

## 2011-09-29 NOTE — Interval H&P Note (Signed)
History and Physical Interval Note:  09/29/2011 8:47 AM  [name]  has presented today for surgery, with the diagnosis of Chest Pain  The various methods of treatment have been discussed with the patient and family. After consideration of risks, benefits and other options for treatment, the patient has consented to  Procedure(s) (LRB): JV LEFT HEART CATHETERIZATION WITH CORONARY/GRAFT ANGIOGRAM (N/A) as a surgical intervention .  The patients' history has been reviewed, patient examined, no change in status, stable for surgery.  I have reviewed the patients' chart and labs.  Questions were answered to the patient's satisfaction.     Kam Kushnir [employer]

## 2011-09-29 NOTE — Progress Notes (Signed)
Site area: right groin  Site Prior to Removal:  Level 0  Pressure Applied For 20 MINUTES    Minutes Beginning at 1410  Manual:   yes  Patient Status During Pull:  stable  Post Pull Groin Site:  Level 0  Post Pull Instructions Given:  yes  Post Pull Pulses Present:  yes  Dressing Applied:  yes  Comments:   

## 2011-09-29 NOTE — Procedures (Signed)
   Cardiac Catheterization Procedure Note  Name: [name] MRN: [id number] DOB: 1956/05/14  Procedure: Left Heart Cath, Selective Coronary Angiography, SVG angiography, LIMA angiography, LV angiography  Indication: Exertional chest pain, abnormal myoview.    Procedural details: The right groin was prepped, draped, and anesthetized with 1% lidocaine. Using modified Seldinger technique, a 5 French sheath was introduced into the right femoral artery. Standard Judkins catheters were used for coronary angiography, SVG angiography,  and left ventriculography. Catheter exchanges were performed over a guidewire. There were no immediate procedural complications. The patient was transferred to the post catheterization recovery area for further monitoring.  Procedural Findings: Hemodynamics:  AO 160/28 LV 154/77   Coronary angiography: Coronary dominance: right  Left mainstem: 30% distal stenosis  Left anterior descending (LAD): 90% ostial stenosis.  There is competitive flow in the LAD from the LIMA.  The LIMA-LAD is patent with good flow in the mid-distal LAD and mild luminals in the mid to distal LAD.   Left circumflex (LCx): The LCx is patent with a small OM1.  The ramus is totally occluded proximally.  There is 95% ostial stenosis in the SVG-ramus.  There is slow flow down the graft and ramus.   Right coronary artery (RCA): The RCA is totally occluded proximally.  There is an SVG-PDA with 95% proximal stenosis.  There is slow flow down the graft to the PDA.  The graft backfills the distal RCA and PLV.   Left ventriculography: Left ventricular systolic function is normal, LVEF is estimated at 55%.  Hand LV-gram was done so difficult to assess for wall motion.   Final Conclusions:  Severe ostial SVG-ramus stenosis and proximal SVG-PDA stenosis.  Slow flow down the grafts to the bypassed vessels.  Patent LIMA.    Recommendations: PCI today to vein grafts. Discussed with Dr. Excell Seltzer. Will  give Plavix 600 mg po.   Marca Ancona 09/29/2011, 9:33 AM

## 2011-09-29 NOTE — H&P (View-Only) (Signed)
Patient ID: [name], male   DOB: 06/21/1956, 55 y.o.   MRN: [id number] [name] is seen today in followup for his vascular disease. has a history coronary bypass surgery in 2005. He had a left carotid endarterectomy by Dr. Seales in August of 2009. 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 Apparently he had been taken back after his left carotid endarterectomy for large hematoma. 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 Unfortunately continues to smoke. I counseled him for less than 10 minutes regarding this. The most he has been able to stop smoking is about 3 days. He's been on Chantix on 2 occasions. He mentioned he may try a hypnotists and I told him that this does work for some people. Long-term risks including need for revascularization of his carotids and coronaries were discussed. He has been compliant with his medications. Has had more SOB and some pressure after walking short distances. Discussed his obesity at length. He has not seen Dr Plotnicov or Daub in over two years and has not had any blood work.  Pressure in chest always associated with dyspnea. Not like previous angina. Had some sharper pain over the last month associated with URI and cough. No fever sputum  Or hemoptysis  F/U testing showed no critical carotid disease with patent left CEA Echo indicated normal EF However myovue showed EF 45% with inferolateral infarct and ischemia.  Had small basal lateral infarct last scan and this is more extensive  Discussed cath with patient.  Scheduled with Dr McClean next Friday.  He has had previous issues with heparin and "clotting"  Also bled after his CEA.  Not clear if he has hematologic issues.  Will check hypercoagulability and von willabrand  Factor in addition to regular labs.  If he needs intervention would use angiomax and avoid heparin  ROS: Denies fever, malais, weight loss, blurry  vision, decreased visual acuity, cough, sputum, SOB, hemoptysis, pleuritic pain, palpitaitons, heartburn, abdominal pain, melena, lower extremity edema, claudication, or rash.  All other systems reviewed and negative  General: Anxious  Obese white male HEENT: normal Neck supple with no adenopathy JVP normal left bruit wit hprevious CEA  no thyromegaly Lungs clear with no wheezing and good diaphragmatic motion Heart:  S1/S2 no murmur, no rub, gallop or click PMI normal Abdomen: benighn, BS positve, no tenderness, no AAA no bruit.  No HSM or HJR Distal pulses intact with no bruits No edema Neuro non-focal Skin warm and dry No muscular weakness   Current Outpatient Prescriptions  Medication Sig Dispense Refill  . aspirin 81 MG tablet Take 81 mg by mouth daily.        . furosemide (LASIX) 80 MG tablet Take 1 tablet (80 mg total) by mouth daily.  30 tablet  12  . metoprolol (TOPROL XL) 50 MG 24 hr tablet Take 50 mg by mouth 2 (two) times daily.        . potassium chloride SA (KLOR-CON M20) 20 MEQ tablet Take 1 tablet (20 mEq total) by mouth daily.  30 tablet  6  . simvastatin (ZOCOR) 10 MG tablet Take 1 tablet (10 mg total) by mouth at bedtime.  30 tablet  11  . albuterol (PROVENTIL,VENTOLIN) 90 MCG/ACT inhaler Inhale 2 puffs into the lungs every 6 (six) hours as needed for wheezing.  17 g  6  . tiotropium (SPIRIVA HANDIHALER) 18 MCG inhalation capsule Place 1 capsule (18 mcg total)   into inhaler and inhale daily.  30 capsule  12    Allergies  Heparin  Electrocardiogram:  02/12/09  NSR nonspecific inferior T changes  Assessment and Plan   

## 2011-09-29 NOTE — Progress Notes (Signed)
Post-PCI: pt without complaints. He can be discharged tomorrow if no problems overnight. He can return to work next Wednesday.

## 2011-09-29 NOTE — Interval H&P Note (Signed)
History and Physical Interval Note:  09/29/2011 10:29 AM  [name]  has presented today for surgery, with the diagnosis of CAD  The various methods of treatment have been discussed with the patient and family. After consideration of risks, benefits and other options for treatment, the patient has consented to  Procedure(s) (LRB): PERCUTANEOUS CORONARY STENT INTERVENTION (PCI-S) (N/A) as a surgical intervention .  The patients' history has been reviewed, patient examined, no change in status, stable for surgery.  I have reviewed the patients' chart and labs.  Questions were answered to the patient's satisfaction.     Tonny Bollman  Reviewed diagnostic cath images. Pt with severe saphenous vein graft disease noted. Plan PCI of SVG to RCA and SVG to OM. Pt loaded with 600 mg plavix. Discussed procedural risks, indication with patient.  Tonny Bollman 09/29/2011 10:30 AM

## 2011-09-29 NOTE — H&P (View-Only) (Signed)
Patient ID: [name], male   DOB: 03/03/1956, 56 y.o.   MRN: [id number] [name] is seen today in followup for his vascular disease. has a history coronary bypass surgery in 2005. He had a left carotid endarterectomy by Dr. Seales in August of 2009. 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 Apparently he had been taken back after his left carotid endarterectomy for large hematoma. 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 Unfortunately continues to smoke. I counseled him for less than 10 minutes regarding this. The most he has been able to stop smoking is about 3 days. He's been on Chantix on 2 occasions. He mentioned he may try a hypnotists and I told him that this does work for some people. Long-term risks including need for revascularization of his carotids and coronaries were discussed. He has been compliant with his medications. Has had more SOB and some pressure after walking short distances. Discussed his obesity at length. He has not seen Dr Plotnicov or Daub in over two years and has not had any blood work.  Pressure in chest always associated with dyspnea. Not like previous angina. Had some sharper pain over the last month associated with URI and cough. No fever sputum  Or hemoptysis  F/U testing showed no critical carotid disease with patent left CEA Echo indicated normal EF However myovue showed EF 45% with inferolateral infarct and ischemia.  Had small basal lateral infarct last scan and this is more extensive  Discussed cath with patient.  Scheduled with Dr McClean next Friday.  He has had previous issues with heparin and "clotting"  Also bled after his CEA.  Not clear if he has hematologic issues.  Will check hypercoagulability and von willabrand  Factor in addition to regular labs.  If he needs intervention would use angiomax and avoid heparin  ROS: Denies fever, malais, weight loss, blurry  vision, decreased visual acuity, cough, sputum, SOB, hemoptysis, pleuritic pain, palpitaitons, heartburn, abdominal pain, melena, lower extremity edema, claudication, or rash.  All other systems reviewed and negative  General: Anxious  Obese white male HEENT: normal Neck supple with no adenopathy JVP normal left bruit wit hprevious CEA  no thyromegaly Lungs clear with no wheezing and good diaphragmatic motion Heart:  S1/S2 no murmur, no rub, gallop or click PMI normal Abdomen: benighn, BS positve, no tenderness, no AAA no bruit.  No HSM or HJR Distal pulses intact with no bruits No edema Neuro non-focal Skin warm and dry No muscular weakness   Current Outpatient Prescriptions  Medication Sig Dispense Refill  . aspirin 81 MG tablet Take 81 mg by mouth daily.        . furosemide (LASIX) 80 MG tablet Take 1 tablet (80 mg total) by mouth daily.  30 tablet  12  . metoprolol (TOPROL XL) 50 MG 24 hr tablet Take 50 mg by mouth 2 (two) times daily.        . potassium chloride SA (KLOR-CON M20) 20 MEQ tablet Take 1 tablet (20 mEq total) by mouth daily.  30 tablet  6  . simvastatin (ZOCOR) 10 MG tablet Take 1 tablet (10 mg total) by mouth at bedtime.  30 tablet  11  . albuterol (PROVENTIL,VENTOLIN) 90 MCG/ACT inhaler Inhale 2 puffs into the lungs every 6 (six) hours as needed for wheezing.  17 g  6  . tiotropium (SPIRIVA HANDIHALER) 18 MCG inhalation capsule Place 1 capsule (18 mcg total)   into inhaler and inhale daily.  30 capsule  12    Allergies  Heparin  Electrocardiogram:  02/12/09  NSR nonspecific inferior T changes  Assessment and Plan   

## 2011-09-29 NOTE — Procedures (Signed)
   CARDIAC CATH NOTE  Name: [name] MRN: [id number] DOB: April 15, 1956  Procedure: PTCA and stenting of the saphenous vein graft to PDA and saphenous vein graft to obtuse marginal  Indication: 56 year old gentleman with known coronary artery disease and previous coronary bypass surgery. He underwent diagnostic catheterization for evaluation of progressive chest pain. He was found to have severe disease in 2 of his vein grafts. He was referred for 2 vessel PCI. The saphenous vein graft to PDA had a 95% proximal stenosis and otherwise appeared to be a smooth graft throughout. The saphenous vein graft to obtuse marginal had 95% ostial stenosis it also appeared to be fairly smooth throughout its course. Both bypass grafts appeared amenable to PCI. The patient was preloaded with 600 mg of Plavix in the outpatient cath lab and he presents now to the inpatient cath lab with an indwelling 5 French sheath in his right femoral artery.  Procedural Details: The right groin area was reprepped. Using normal sterile technique, the 5 French sheath was changed out for a 6 [place] sheath over a wire. Angiomax was used for anticoagulation. Of note the patient has a history of heparin-induced thrombocytopenia.  Attention was first turned to the saphenous vein graft to PDA. An RCB guide catheter was used. A cougar guidewire was advanced beyond the lesion and a 5 mm spider device was used for distal embolic protection. The lesion was predilated with a 2.5 x 15 mm Trek balloon.  The vessel was then stented back to the ostium with a 4.0 x 20 mm promise element drug-eluting stent. The stent was deployed at 14 atmospheres. There was some underexpansion in the midportion of the stent that was post dilated with a 4.5 x 15 mm noncompliant balloon up to 16 atmospheres. There was an excellent angiographic result. The spider basket was retrieved using the retrieval sheath. Attention was then turned to the saphenous vein graft to  obtuse marginal. An AL-1 guide catheter was used. A cougar guidewire was advanced across the ostial lesion and a spider device was again advanced. This time a 4.0 mm device was chosen. The lesion was predilated with a 2.5 x 15 mm balloon. Because of the ostial involvement in the way that guide catheter was positioned, I was concerned that it would be difficult to retrieve the spider device following stenting. Therefore, decided to remove the device with the retrieval sheath. The cougar wire was readvanced easily. The lesion was stented with the stent hanging out of the ostium for full coverage utilizing a 3.0 x 16 mm promise element stent. The stent was deployed at 14 atmospheres. The stent balloon was then pulled back and re\re dilated to 16 atmospheres. There was an excellent angiographic result with 0% residual stenosis at both lesion sites and TIMI-3 flow in both vessels. The patient tolerated the procedure well. He will be transferred to the recovery area in stable condition.  Conclusions: Successful 2 vessel PCI with drug-eluting stents in the saphenous vein graft to PDA and a saphenous vein graft obtuse marginal  Recommendations: Dual antiplatelet therapy with aspirin and Plavix for a minimum of 12 months.  Tonny Bollman 09/29/2011, 11:38 AM

## 2011-09-30 ENCOUNTER — Encounter (HOSPITAL_COMMUNITY): Payer: Self-pay | Admitting: Physician Assistant

## 2011-09-30 ENCOUNTER — Other Ambulatory Visit: Payer: Self-pay

## 2011-09-30 DIAGNOSIS — I2510 Atherosclerotic heart disease of native coronary artery without angina pectoris: Secondary | ICD-10-CM

## 2011-09-30 LAB — BASIC METABOLIC PANEL
BUN: 15 mg/dL (ref 6–23)
Chloride: 101 mEq/L (ref 96–112)
GFR calc non Af Amer: >90 mL/min (ref >90)
Glucose, Bld: 96 mg/dL (ref 70–99)
Potassium: 4.7 mEq/L (ref 3.5–5.1)
Sodium: 139 mEq/L (ref 135–145)

## 2011-09-30 LAB — CBC
HCT: 47.7 % (ref 39.0–52.0)
Hemoglobin: 15.9 g/dL (ref 13.0–17.0)
MCHC: 33.3 g/dL (ref 30.0–36.0)
RBC: 4.93 MIL/uL (ref 4.22–5.81)
WBC: 12.2 10*3/uL — ABNORMAL HIGH (ref 4.0–10.5)

## 2011-09-30 MED ORDER — NITROGLYCERIN 0.4 MG SL SUBL: 0.4000 mg | SUBLINGUAL_TABLET | SUBLINGUAL | Status: DC | PRN

## 2011-09-30 MED ORDER — ALBUTEROL 90 MCG/ACT IN AERS: 2.0000 | INHALATION_SPRAY | Freq: Four times a day (QID) | RESPIRATORY_TRACT | Status: DC | PRN

## 2011-09-30 MED ORDER — TIOTROPIUM BROMIDE MONOHYDRATE 18 MCG IN CAPS: 18.0000 ug | ORAL_CAPSULE | Freq: Every day | RESPIRATORY_TRACT | Status: DC

## 2011-09-30 MED ORDER — CLOPIDOGREL BISULFATE 75 MG PO TABS: 75.0000 mg | ORAL_TABLET | Freq: Every day | ORAL | Status: DC

## 2011-09-30 NOTE — Discharge Summary (Signed)
Discharge Summary   Patient ID: [name] MRN: [id number], DOB/AGE: 1956-07-29 56 y.o. Admit date: 09/29/2011 D/C date:     09/30/2011   Primary Discharge Diagnoses:  1. CAD - s/p PTCA and stenting of the saphenous vein graft to PDA and saphenous vein graft to obtuse marginal by Dr. Excell Seltzer 09/29/11 - s/p CABG in 2005 - normal EF by cath 09/29/11  Secondary Discharge Diagnoses:  1. Carotid dz s/p L CEA 2009 (hx of evacuation of hematoma due to heparin) - no critical disease on duplex 2013 2. HL 3. HTN 4. 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 Obesity 6. COPD 7. GERD 8. Chronic low back pain 9. H/o edema 10. Sleep apnea 11. Basal cell CA lesion on nose excision   Hospital Course: [name] is a 56 y/o M with history of CAD s/p CABG 2005 who presented to Dr. Fabio Bering office with complaints of chest pressure associated with dyspnea, not like prior angina. He also had sharper pain over the last month associated with URI and cough. [name] notes that myovue showed EF 45% with inferolateral infarct and ischemia, small basal lateral infarct last scan and this is more extensive. [name] recommended cath to define his coronary anatomy and he was brought in yesterday for this procedure. Of note the patient had a history of problems with heparin and underwent preliminary labwork including von Willebrand/coag workup which looked okay per [name] - he suggested his previous blood issues were likely HT/heparin related. The patient underwent diagnostic cath by Dr. Shirlee Latch 09/29/11 demonstrating severe ostial SVG-ramus stenosis and proximal SVG-PDA stenosis, slow flow down the grafts to the bypassed vessels, patent LIMA. He underwent PTCA and stenting of the saphenous vein graft to PDA and saphenous vein graft to obtuse marginal by Dr. Excell Seltzer. He tolerated this procedure well. Dr. Patty Sermons has seen and examined him and feels he is stable for discharge. Dr. Excell Seltzer has already noted that he can return to work  next Wednesday.  Discharge Vitals: Blood pressure 123/52, pulse 64, temperature 98.7 F (37.1 C), temperature source Oral, resp. rate 17, height 6' (1.829 m), weight 350 lb 8.5 oz (159 kg), SpO2 91.00%.  Labs: Lab Results  Component Value Date   WBC 12.2* 09/30/2011   HGB 15.9 09/30/2011   HCT 47.7 09/30/2011   MCV 96.8 09/30/2011   PLT 122* 09/30/2011    Lab 09/30/11 0400  NA 139  K 4.7  CL 101  CO2 30  BUN 15  CREATININE 0.84  CALCIUM 9.4  PROT --  BILITOT --  ALKPHOS --  ALT --  AST --  GLUCOSE 96    Lab Results  Component Value Date   CHOL 143 09/15/2011   HDL 26.00* 09/15/2011   LDLCALC 93 09/15/2011   TRIG 122.0 09/15/2011     Diagnostic Studies/Procedures   1. Chest 2 View 09/13/2011  *RADIOLOGY REPORT*  Clinical Data: Shortness of breath.  Smoker with history of COPD, hypertension, and prior CABG.  CHEST - 2 VIEW 09/13/2011:  Comparison: Portable chest x-ray 03/18/2008 and two-view chest x- ray 03/16/2008, 03/03/2004 [facility].  PET CT 06/24/2009.  Findings: Prior sternotomy for CABG.  Cardiac silhouette enlarged but stable.  Emphysematous changes in the upper lobes.  Pulmonary venous hypertension and increased interstitial opacity when compared to the prior examinations.  No confluent airspace consolidation.  No pleural effusion.  Bilateral calcified pleural plaques, left greater than right, unchanged.  Enlarged central pulmonary arteries, more so than on the  prior examination. Degenerative changes involving the thoracic spine.  Lateral image blurred by respiratory motion.  IMPRESSION: Mild diffuse interstitial pulmonary edema is favored over acute bronchitis in this patient with COPD/emphysema.  Stable moderate cardiomegaly.  Enlarging central pulmonary arteries compared the prior examinations, query developing pulmonary serial hypertension.  Original Report Authenticated By: Arnell Sieving, M.D.   2. Cardiac catheterization this admission, please see full report  and above for summary.  Discharge Medications   Medication List  As of 09/30/2011  8:56 AM   TAKE these medications         albuterol 90 MCG/ACT inhaler   Commonly known as: PROVENTIL,VENTOLIN   Inhale 2 puffs into the lungs every 6 (six) hours as needed for wheezing.      aspirin EC 81 MG tablet   Take 81 mg by mouth daily.      clopidogrel 75 MG tablet   Commonly known as: PLAVIX   Take 1 tablet (75 mg total) by mouth daily with breakfast.      furosemide 80 MG tablet   Commonly known as: LASIX   Take 1 tablet (80 mg total) by mouth daily.      nitroGLYCERIN 0.4 MG SL tablet   Commonly known as: NITROSTAT   Place 1 tablet (0.4 mg total) under the tongue every 5 (five) minutes as needed for chest pain (Up to 3 doses).      potassium chloride SA 20 MEQ tablet   Commonly known as: K-DUR,KLOR-CON   Take 1 tablet (20 mEq total) by mouth daily.      simvastatin 10 MG tablet   Commonly known as: ZOCOR   Take 1 tablet (10 mg total) by mouth at bedtime.      tiotropium 18 MCG inhalation capsule   Commonly known as: SPIRIVA   Place 1 capsule (18 mcg total) into inhaler and inhale daily.      TOPROL XL 50 MG 24 hr tablet   Generic drug: metoprolol succinate   Take 50 mg by mouth 2 (two) times daily.            Disposition   The patient will be discharged in stable condition to home. Discharge Orders    Future Orders Please Complete By Expires   Diet - low sodium heart healthy      Increase activity slowly      Comments:   No driving for 2 days. No lifting over 5 lbs for 1 week. No sexual activity for 1 week. You may return to work on 10/04/11. Keep procedure site clean & dry. If you notice increased pain, swelling, bleeding or pus, call/return!  You may shower, but no soaking baths/hot tubs/pools for 1 week.       Follow-up Information    Follow up with Charlton Haws, MD. (Our office will call you with an appointment)    Contact information:   [address], [address] [place] [address] [phone]            Duration of Discharge Encounter: Greater than 30 minutes including physician and PA time.  Signed, Ronie Spies PA-C 09/30/2011, 8:56 AM

## 2011-09-30 NOTE — Progress Notes (Signed)
CARDIAC REHAB PHASE I   PRE:  Rate/Rhythm: 74  BP:  Supine:   Sitting: 140/61  Standing:     SaO2: 90% RA   MODE:  Ambulation: 340 ft   POST:  Rate/Rhythem: 72  BP:  Supine:   Sitting: 155/57  Standing:    SaO2: 96%RA  Pt ambulated unit steady, no cp or sob. Education done with understanding. Permission for phase II card rehab.  0830 - 0930  Rosalie Doctor

## 2011-09-30 NOTE — Progress Notes (Signed)
Subjective:  Patient feels well this am after PCI yesterday.  No chest pain. Telemetry NSR.  EKG stable.  Objective:  Vital Signs in the last 24 hours: Temp:  [97.8 F (36.6 C)-98.7 F (37.1 C)] 98.7 F (37.1 C) (02/16 0700) Pulse Rate:  [64-71] 64  (02/16 0700) Resp:  [14-20] 17  (02/16 0700) BP: (118-143)/(52-64) 123/52 mmHg (02/16 0700) SpO2:  [91 %-98 %] 91 % (02/16 0700) Weight:  [350 lb 8.5 oz (159 kg)] 350 lb 8.5 oz (159 kg) (02/16 0521)  Intake/Output from previous day: 02/15 0701 - 02/16 0700 In: 580 [P.O.:480; I.V.:100] Out: 2000 [Urine:2000] Intake/Output from this shift:       . aspirin  81 mg Oral Daily  . bivalirudin      . bivalirudin      . clopidogrel  75 mg Oral Q breakfast  . fentaNYL      . furosemide  80 mg Oral Daily  . influenza  inactive virus vaccine  0.5 mL Intramuscular Tomorrow-1000  . lidocaine      . metoprolol succinate  100 mg Oral Daily  . midazolam      . midazolam      . nitroGLYCERIN      . potassium chloride SA  20 mEq Oral Daily  . simvastatin  10 mg Oral QHS  . tiotropium  18 mcg Inhalation Daily  . DISCONTD: aspirin  81 mg Oral Daily  . DISCONTD: metoprolol succinate  100 mg Oral BID  . DISCONTD: metoprolol succinate  50 mg Oral BID      . sodium chloride 100 mL/hr at 09/29/11 1321    Physical Exam: The patient appears to be in no distress.  Head and neck exam reveals that the pupils are equal and reactive.  The extraocular movements are full.  There is no scleral icterus.  Mouth and pharynx are benign.  No lymphadenopathy.  No carotid bruits.  The jugular venous pressure is normal.  Thyroid is not enlarged or tender.  Chest reveals coarse rhonchi [smoker].  Heart reveals no abnormal lift or heave.  First and second heart sounds are normal.  There is no murmur gallop rub or click.  The abdomen is soft and nontender.  Bowel sounds are normoactive.  There is no hepatosplenomegaly or mass.  There are no abdominal  bruits.  Right groin shows no hematoma.  Extremities reveal no phlebitis or edema.  Pedal pulses are good.  There is no cyanosis or clubbing.  Neurologic exam is normal strength and no lateralizing weakness.  No sensory deficits.  Integument reveals no rash  Lab Results:  Basename 09/30/11 0400  WBC 12.2*  HGB 15.9  PLT 122*    Basename 09/30/11 0400  NA 139  K 4.7  CL 101  CO2 30  GLUCOSE 96  BUN 15  CREATININE 0.84   No results found for this basename: TROPONINI:2,CK,MB:2 in the last 72 hours Hepatic Function Panel No results found for this basename: PROT,ALBUMIN,AST,ALT,ALKPHOS,BILITOT,BILIDIR,IBILI in the last 72 hours No results found for this basename: CHOL in the last 72 hours No results found for this basename: PROTIME in the last 72 hours  Imaging: Imaging results have been reviewed  Cardiac Studies:  Assessment/Plan:  Patient Active Hospital Problem List: Successful  PTCA and stenting of SVG to PDA and SVG to obtuse marginal.  Plan:  Home today on dual antiplatelet therapy  RTW Wednesday Followup with [name]   LOS: 1 day    [name]  09/30/2011, 8:26 AM

## 2011-10-02 ENCOUNTER — Telehealth: Payer: Self-pay | Admitting: Cardiovascular Disease

## 2011-10-02 MED FILL — Dextrose Inj 5%: INTRAVENOUS | Qty: 50 | Status: AC

## 2011-10-02 NOTE — Telephone Encounter (Signed)
PT TO FAX FORM TO OFFICE ,NEEDS  FILLED OUT WILL FAX BACK ONCE COMPLETED./CY

## 2011-10-02 NOTE — Telephone Encounter (Signed)
New Msg: Pt calling needing a letter stating that pt can return to work. Pt had two stents put in by Dr. Shirlee Latch. 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 Please return pt call to discuss further.

## 2011-10-03 NOTE — Telephone Encounter (Signed)
Patient states has spoken with [name] yesterday and faxed return to work form for [name] to sign, [name] had supposed to have papers ready today, , because  he is to go back to work tomorrow 10/04/11. [name] was called at home and left a message to see about these forms because I  was unable to find forms here in the office.

## 2011-10-03 NOTE — Telephone Encounter (Signed)
FU Call: Pt calling wanting to check on status of pt forms that need to be filled out. If they were received and if there were completed. Please return pt call to discuss further.

## 2011-10-03 NOTE — Telephone Encounter (Signed)
Patient aware of return to work paper were signed and faxed to [phone], Attention to Jovita Kussmaul.

## 2011-10-13 ENCOUNTER — Encounter: Payer: Self-pay | Admitting: Physician Assistant

## 2011-10-13 ENCOUNTER — Ambulatory Visit (INDEPENDENT_AMBULATORY_CARE_PROVIDER_SITE_OTHER): Payer: BC Managed Care – PPO | Admitting: Physician Assistant

## 2011-10-13 VITALS — BP 120/88 | HR 62 | Ht 72.0 in | Wt 348.4 lb

## 2011-10-13 DIAGNOSIS — I2510 Atherosclerotic heart disease of native coronary artery without angina pectoris: Secondary | ICD-10-CM

## 2011-10-13 DIAGNOSIS — Z720 Tobacco use: Secondary | ICD-10-CM

## 2011-10-13 DIAGNOSIS — I10 Essential (primary) hypertension: Secondary | ICD-10-CM

## 2011-10-13 DIAGNOSIS — E669 Obesity, unspecified: Secondary | ICD-10-CM

## 2011-10-13 DIAGNOSIS — I25810 Atherosclerosis of coronary artery bypass graft(s) without angina pectoris: Secondary | ICD-10-CM

## 2011-10-13 DIAGNOSIS — F17200 Nicotine dependence, unspecified, uncomplicated: Secondary | ICD-10-CM

## 2011-10-13 NOTE — Progress Notes (Signed)
[address] [place], [place]  [address] Phone: [phone] Fax:  [phone]  Date:  10/13/2011   Name:  [name]       DOB:  [birth year] MRN:  [id number]  PCP:  Dr. Posey Rea Primary Cardiologist:  Dr. Charlton Haws   Primary Electrophysiologist:  None    History of Present Illness: [name] is a 56 y.o. male who presents for post hospital follow up.  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  He was evaluated recently for chest discomfort and a Myoview scan demonstrated inferolateral infarct and ischemia.  Cardiac catheterization was arranged.  LHC 09/29/11: Distal left main 30%, ostial LAD 90%, LIMA-LAD patent, proximal RI occluded, ostial SVG-RI 95%, proximal RCA occluded, proximal SVG-PDA 95%, EF 55%.  The patient underwent PCI with Dr. Excell Seltzer on the same date: Promus DES to the SVG-RI and Promus DES to the SVG-PDA.  Labs: Hemoglobin 15.9, potassium 4.7, creatinine 0.4.  Since discharge, he is doing well.  The patient denies chest pain, shortness of breath, syncope, orthopnea, PND.  He has chronic LE edema that is unchanged.  No pain at cath site in right groin.  Frustrated because he cannot lose weight.  Also, wants to quit smoking.  He had side effects to Chantix.    Past Medical History  Diagnosis Date  . Coronary artery disease     CABG 2005. s/p PTCA and stenting of the saphenous vein graft to PDA and saphenous vein graft to obtuse marginal by Dr. Excell Seltzer 09/29/11. Normal EF at cath 09/2011  . Carotid artery disease     s/p L CEA 2009 (hx of evacuation of hematoma due to heparin)  . Heparin induced thrombocytopenia   . Hypertension   . Hyperlipidemia   . Obesity   . Edema   . COPD (chronic obstructive pulmonary disease)   . GERD (gastroesophageal reflux disease)     . Basal cell carcinoma of nose   . Sleep apnea     "used to"  . Chronic lower back pain     Current Outpatient Prescriptions  Medication Sig Dispense Refill  . aspirin EC 81 MG tablet Take 81 mg by mouth daily.      . clopidogrel (PLAVIX) 75 MG tablet Take 1 tablet (75 mg total) by mouth daily with breakfast.  30 tablet  6  . furosemide (LASIX) 80 MG tablet Take 1 tablet (80 mg total) by mouth daily.  30 tablet  12  . metoprolol (TOPROL XL) 50 MG 24 hr tablet Take 50 mg by mouth 2 (two) times daily.        . nitroGLYCERIN (NITROSTAT) 0.4 MG SL tablet Place 1 tablet (0.4 mg total) under the tongue every 5 (five) minutes as needed for chest pain (Up to 3 doses).  25 tablet  4  . potassium chloride SA (KLOR-CON M20) 20 MEQ tablet Take 1 tablet (20 mEq total) by mouth daily.  30 tablet  6  . simvastatin (ZOCOR) 10 MG tablet Take 1 tablet (10 mg total) by mouth at bedtime.  30 tablet  11  . albuterol (PROVENTIL,VENTOLIN) 90 MCG/ACT inhaler Inhale 2 puffs into the lungs every 6 (six) hours as needed for wheezing.  17 g  6  . tiotropium (SPIRIVA HANDIHALER) 18 MCG inhalation capsule Place 1 capsule (18 mcg total) into inhaler and  inhale daily.  30 capsule  12  . DISCONTD: albuterol (PROVENTIL,VENTOLIN) 90 MCG/ACT inhaler Inhale 2 puffs into the lungs every 6 (six) hours as needed for wheezing.      Marland Kitchen DISCONTD: tiotropium (SPIRIVA HANDIHALER) 18 MCG inhalation capsule Place 1 capsule (18 mcg total) into inhaler and inhale daily.        Allergies: Allergies  Allergen Reactions  . Heparin Other (See Comments)    Opposite reaction    History  Substance Use Topics  . Smoking status: Current Everyday Smoker -- 2.0 packs/day for 41 years    Types: Cigarettes  . Smokeless tobacco: Former Neurosurgeon    Types: Chew   Comment: consult entered  . Alcohol Use: Yes     09/29/11 "quit years ago"     ROS:  Please see the history of present illness.   All other systems reviewed and negative.   PHYSICAL  EXAM: VS:  BP 120/88  Pulse 62  Ht 6' (1.829 m)  Wt 348 lb 6.4 oz (158.033 kg)  BMI 47.25 kg/m2 Well nourished, well developed, in no acute distress HEENT: normal Neck: no JVD Cardiac:  normal S1, S2; RRR; no murmur Lungs:  clear to auscultation bilaterally, no wheezing, rhonchi or rales Abd: soft, nontender, no hepatomegaly Ext: no edema; right groin without hematoma or pain but a soft FA bruit is noted Skin: warm and dry Neuro:  CNs 2-12 intact, no focal abnormalities noted  EKG:  Sinus rhythm, heart rate 64, normal axis, nonspecific ST-T wave changes, incomplete right bundle branch block, no significant change compared to prior tracing  ASSESSMENT AND PLAN:  1. Coronary Artery Disease   Doing well s/p PCI to S-RI and S-PDA.  We discussed the importance of dual antiplatelet therapy.  Follow up with Dr. Charlton Haws in 2-3 mos.  He does have a bruit over his RFA site.  However, it appears stable without hematoma or pain.  No further testing at this time.  I asked him to notify us if he has any pain or swelling.   2. HYPERTENSION  Controlled.  Continue current therapy.    3. Tobacco abuse  We discussed the importance of cessation and different strategies for quitting.      4. OBESITY  We discussed different strategies for weight loss today.        Luna Glasgow, PA-C  12:43 PM 10/13/2011

## 2011-10-13 NOTE — Patient Instructions (Signed)
Your physician recommends that you schedule a follow-up appointment in: 2-3 MONTHS WITH [name].  NO CHANGES TODAY

## 2011-11-07 ENCOUNTER — Other Ambulatory Visit: Payer: Self-pay | Admitting: Cardiovascular Disease

## 2012-02-01 ENCOUNTER — Ambulatory Visit: Payer: BC Managed Care – PPO | Admitting: Cardiovascular Disease

## 2012-02-14 ENCOUNTER — Ambulatory Visit: Payer: BC Managed Care – PPO | Admitting: Cardiovascular Disease

## 2012-03-14 ENCOUNTER — Ambulatory Visit: Payer: BC Managed Care – PPO | Admitting: Cardiovascular Disease

## 2012-04-02 ENCOUNTER — Ambulatory Visit: Payer: BC Managed Care – PPO | Admitting: Cardiovascular Disease

## 2012-04-18 ENCOUNTER — Ambulatory Visit: Payer: BC Managed Care – PPO | Admitting: Cardiovascular Disease

## 2012-05-09 ENCOUNTER — Other Ambulatory Visit: Payer: Self-pay | Admitting: *Deleted

## 2012-05-09 MED ORDER — CLOPIDOGREL BISULFATE 75 MG PO TABS: 75.0000 mg | ORAL_TABLET | Freq: Every day | ORAL | Status: DC

## 2012-05-09 NOTE — Telephone Encounter (Signed)
Refilled clopidogrel.

## 2012-05-14 ENCOUNTER — Ambulatory Visit: Payer: BC Managed Care – PPO | Admitting: Cardiovascular Disease

## 2012-06-26 ENCOUNTER — Ambulatory Visit: Payer: BC Managed Care – PPO | Admitting: Cardiovascular Disease

## 2012-07-02 ENCOUNTER — Other Ambulatory Visit: Payer: Self-pay | Admitting: *Deleted

## 2012-07-02 MED ORDER — FUROSEMIDE 80 MG PO TABS: 80.0000 mg | ORAL_TABLET | Freq: Every day | ORAL | Status: DC

## 2012-07-09 ENCOUNTER — Ambulatory Visit: Payer: BC Managed Care – PPO | Admitting: Cardiovascular Disease

## 2012-09-03 ENCOUNTER — Encounter: Payer: Self-pay | Admitting: Cardiovascular Disease

## 2012-09-03 ENCOUNTER — Ambulatory Visit (INDEPENDENT_AMBULATORY_CARE_PROVIDER_SITE_OTHER): Payer: BC Managed Care – PPO | Admitting: Cardiovascular Disease

## 2012-09-03 VITALS — BP 142/90 | HR 66 | Ht 72.0 in | Wt 353.0 lb

## 2012-09-03 DIAGNOSIS — R011 Cardiac murmur, unspecified: Secondary | ICD-10-CM

## 2012-09-03 DIAGNOSIS — F17200 Nicotine dependence, unspecified, uncomplicated: Secondary | ICD-10-CM

## 2012-09-03 DIAGNOSIS — Z720 Tobacco use: Secondary | ICD-10-CM

## 2012-09-03 DIAGNOSIS — I10 Essential (primary) hypertension: Secondary | ICD-10-CM

## 2012-09-03 DIAGNOSIS — E785 Hyperlipidemia, unspecified: Secondary | ICD-10-CM

## 2012-09-03 DIAGNOSIS — R0989 Other specified symptoms and signs involving the circulatory and respiratory systems: Secondary | ICD-10-CM

## 2012-09-03 DIAGNOSIS — I25810 Atherosclerosis of coronary artery bypass graft(s) without angina pectoris: Secondary | ICD-10-CM

## 2012-09-03 MED ORDER — CLOPIDOGREL BISULFATE 75 MG PO TABS: 75.0000 mg | ORAL_TABLET | Freq: Every day | ORAL | Status: DC

## 2012-09-03 MED ORDER — POTASSIUM CHLORIDE CRYS ER 20 MEQ PO TBCR: 20.0000 meq | EXTENDED_RELEASE_TABLET | Freq: Every day | ORAL | Status: DC

## 2012-09-03 NOTE — Progress Notes (Signed)
Patient ID: [name], male   DOB: 01-26-1956, 57 y.o.   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 He was evaluated recently for chest discomfort and a Myoview scan demonstrated inferolateral infarct and ischemia. Cardiac catheterization was arranged. LHC 09/29/11: Distal left main 30%, ostial LAD 90%, LIMA-LAD patent, proximal RI occluded, ostial SVG-RI 95%, proximal RCA occluded, proximal SVG-PDA 95%, EF 55%. The patient underwent PCI with Dr. Excell Seltzer on the same date: Promus DES to the SVG-RI and Promus DES to the SVG-PDA. Labs: Hemoglobin 15.9, potassium 4.7, creatinine 0.4.  Since discharge, he is doing well. The patient denies chest pain, shortness of breath, syncope, orthopnea, PND. He has chronic LE edema that is unchanged. No pain at cath site in right groin. Frustrated because he cannot lose weight. Also, wants to quit smoking. He had side effects to Chantix. Trying to eat better.  No chest pain  ROS: Denies fever, malais, weight loss, blurry vision, decreased visual acuity, cough, sputum, SOB, hemoptysis, pleuritic pain, palpitaitons, heartburn, abdominal pain, melena, lower extremity edema, claudication, or rash.  All other systems reviewed and negative  General: Affect appropriate Obese white male HEENT: normal Neck supple with no adenopathy JVP normal left bruit  no thyromegaly Lungs clear with no wheezing and good diaphragmatic motion Heart:  S1/S2 SEM murmur, no rub, gallop or click PMI normal Abdomen: benighn, BS positve, no tenderness, no AAA no bruit.  No HSM or HJR Distal pulses intact with no bruits No edema Neuro non-focal Skin warm and dry No muscular weakness   Current Outpatient Prescriptions  Medication Sig Dispense Refill  . albuterol (PROVENTIL,VENTOLIN) 90 MCG/ACT  inhaler Inhale 2 puffs into the lungs every 6 (six) hours as needed for wheezing.  17 g  6  . aspirin EC 81 MG tablet Take 81 mg by mouth daily.      . clopidogrel (PLAVIX) 75 MG tablet Take 1 tablet (75 mg total) by mouth daily with breakfast.  30 tablet  6  . furosemide (LASIX) 80 MG tablet Take 1 tablet (80 mg total) by mouth daily.  30 tablet  12  . metoprolol succinate (TOPROL-XL) 50 MG 24 hr tablet TAKE 2 TABLETS EVERY DAY  60 tablet  11  . nitroGLYCERIN (NITROSTAT) 0.4 MG SL tablet Place 1 tablet (0.4 mg total) under the tongue every 5 (five) minutes as needed for chest pain (Up to 3 doses).  25 tablet  4  . potassium chloride SA (KLOR-CON M20) 20 MEQ tablet Take 1 tablet (20 mEq total) by mouth daily.  30 tablet  6  . simvastatin (ZOCOR) 10 MG tablet Take 1 tablet (10 mg total) by mouth at bedtime.  30 tablet  11    Allergies  Heparin  Electrocardiogram:   3/1 SR rate 61 [id number]   Assessment and Plan

## 2012-09-03 NOTE — Assessment & Plan Note (Signed)
Trying to quit counseled for less than 10 minutes Chantix not helpful

## 2012-09-03 NOTE — Assessment & Plan Note (Signed)
No significant valve diseae on echo last year.  F/U echo in a year.  AV sclerosis

## 2012-09-03 NOTE — Assessment & Plan Note (Signed)
No significant disease on duplex 2/13  F/U 2/15 ASA

## 2012-09-03 NOTE — Assessment & Plan Note (Signed)
Stable with no angina and good activity level.  Continue medical Rx  

## 2012-09-03 NOTE — Assessment & Plan Note (Signed)
Well controlled.  Continue current medications and low sodium Dash type diet.    

## 2012-09-03 NOTE — Patient Instructions (Signed)
Your physician wants you to follow-up in:   6 MONTHS WITH DR Haywood Filler will receive a reminder letter in the mail two months in advance. If you don't receive a letter, please call our office to schedule the follow-up appointment. Your physician has requested that you have a carotid duplex. This test is an ultrasound of the carotid arteries in your neck. It looks at blood flow through these arteries that supply the brain with blood. Allow one hour for this exam. There are no restrictions or special instructions.

## 2012-09-03 NOTE — Assessment & Plan Note (Signed)
Stable with no angina and good activity level.  Continue medical Rx Continue plavix 

## 2012-09-23 ENCOUNTER — Telehealth: Payer: Self-pay | Admitting: Cardiovascular Disease

## 2012-09-23 MED ORDER — SIMVASTATIN 10 MG PO TABS: 10.0000 mg | ORAL_TABLET | Freq: Every day | ORAL | Status: DC

## 2012-09-23 NOTE — Telephone Encounter (Signed)
Pt needs refill of simvastatin 10mg 

## 2012-11-08 ENCOUNTER — Encounter (INDEPENDENT_AMBULATORY_CARE_PROVIDER_SITE_OTHER): Payer: BC Managed Care – PPO

## 2012-11-08 DIAGNOSIS — I6529 Occlusion and stenosis of unspecified carotid artery: Secondary | ICD-10-CM

## 2012-11-20 ENCOUNTER — Encounter: Payer: Self-pay | Admitting: *Deleted

## 2012-11-25 ENCOUNTER — Other Ambulatory Visit: Payer: Self-pay | Admitting: *Deleted

## 2012-11-25 MED ORDER — METOPROLOL SUCCINATE ER 50 MG PO TB24: ORAL_TABLET | ORAL | Status: DC

## 2013-01-13 ENCOUNTER — Telehealth: Payer: Self-pay | Admitting: Internal Medicine

## 2013-01-13 NOTE — Telephone Encounter (Signed)
Pt was last seen by Dr. Posey Rea around 2005 or 2006.  He has been going to other doctors and urgent care.  He would like to re-est.  He now has [facility].  He has leg swelling.

## 2013-01-13 NOTE — Telephone Encounter (Signed)
Ok Thx 

## 2013-02-25 ENCOUNTER — Other Ambulatory Visit (INDEPENDENT_AMBULATORY_CARE_PROVIDER_SITE_OTHER): Payer: BC Managed Care – PPO

## 2013-02-25 ENCOUNTER — Ambulatory Visit (INDEPENDENT_AMBULATORY_CARE_PROVIDER_SITE_OTHER): Payer: BC Managed Care – PPO | Admitting: Internal Medicine

## 2013-02-25 ENCOUNTER — Encounter: Payer: Self-pay | Admitting: Internal Medicine

## 2013-02-25 VITALS — BP 138/80 | HR 80 | Temp 98.0°F | Resp 16 | Ht 72.0 in | Wt 357.0 lb

## 2013-02-25 DIAGNOSIS — I83009 Varicose veins of unspecified lower extremity with ulcer of unspecified site: Secondary | ICD-10-CM

## 2013-02-25 DIAGNOSIS — E1159 Type 2 diabetes mellitus with other circulatory complications: Secondary | ICD-10-CM

## 2013-02-25 DIAGNOSIS — I10 Essential (primary) hypertension: Secondary | ICD-10-CM

## 2013-02-25 DIAGNOSIS — F419 Anxiety disorder, unspecified: Secondary | ICD-10-CM | POA: Insufficient documentation

## 2013-02-25 DIAGNOSIS — Z720 Tobacco use: Secondary | ICD-10-CM

## 2013-02-25 DIAGNOSIS — J4489 Other specified chronic obstructive pulmonary disease: Secondary | ICD-10-CM

## 2013-02-25 DIAGNOSIS — F411 Generalized anxiety disorder: Secondary | ICD-10-CM

## 2013-02-25 DIAGNOSIS — I83029 Varicose veins of left lower extremity with ulcer of unspecified site: Secondary | ICD-10-CM

## 2013-02-25 DIAGNOSIS — F17200 Nicotine dependence, unspecified, uncomplicated: Secondary | ICD-10-CM

## 2013-02-25 DIAGNOSIS — E785 Hyperlipidemia, unspecified: Secondary | ICD-10-CM

## 2013-02-25 DIAGNOSIS — L97929 Non-pressure chronic ulcer of unspecified part of left lower leg with unspecified severity: Secondary | ICD-10-CM

## 2013-02-25 DIAGNOSIS — G4730 Sleep apnea, unspecified: Secondary | ICD-10-CM

## 2013-02-25 DIAGNOSIS — E669 Obesity, unspecified: Secondary | ICD-10-CM

## 2013-02-25 DIAGNOSIS — R609 Edema, unspecified: Secondary | ICD-10-CM

## 2013-02-25 DIAGNOSIS — I25810 Atherosclerosis of coronary artery bypass graft(s) without angina pectoris: Secondary | ICD-10-CM

## 2013-02-25 DIAGNOSIS — I87313 Chronic venous hypertension (idiopathic) with ulcer of bilateral lower extremity: Secondary | ICD-10-CM | POA: Insufficient documentation

## 2013-02-25 DIAGNOSIS — I872 Venous insufficiency (chronic) (peripheral): Secondary | ICD-10-CM

## 2013-02-25 DIAGNOSIS — L97909 Non-pressure chronic ulcer of unspecified part of unspecified lower leg with unspecified severity: Secondary | ICD-10-CM

## 2013-02-25 DIAGNOSIS — J449 Chronic obstructive pulmonary disease, unspecified: Secondary | ICD-10-CM

## 2013-02-25 LAB — HEPATIC FUNCTION PANEL
ALT: 24 U/L (ref 0–53)
AST: 21 U/L (ref 0–37)
Alkaline Phosphatase: 85 U/L (ref 39–117)
Bilirubin, Direct: 0.1 mg/dL (ref 0.0–0.3)
Total Protein: 7.7 g/dL (ref 6.0–8.3)

## 2013-02-25 LAB — BASIC METABOLIC PANEL
Calcium: 9.2 mg/dL (ref 8.4–10.5)
Creatinine, Ser: 0.9 mg/dL (ref 0.4–1.5)
Sodium: 137 mEq/L (ref 135–145)

## 2013-02-25 LAB — CBC WITH DIFFERENTIAL/PLATELET
Basophils Relative: 0.2 % (ref 0.0–3.0)
Eosinophils Relative: 2.2 % (ref 0.0–5.0)
Hemoglobin: 17 g/dL (ref 13.0–17.0)
Lymphocytes Relative: 12.2 % (ref 12.0–46.0)
Monocytes Relative: 11.6 % (ref 3.0–12.0)
Neutro Abs: 8.6 10*3/uL — ABNORMAL HIGH (ref 1.4–7.7)
Neutrophils Relative %: 73.8 % (ref 43.0–77.0)
RBC: 5.09 Mil/uL (ref 4.22–5.81)
WBC: 11.6 10*3/uL — ABNORMAL HIGH (ref 4.5–10.5)

## 2013-02-25 LAB — URINALYSIS
Ketones, ur: NEGATIVE
Leukocytes, UA: NEGATIVE
Specific Gravity, Urine: 1.01 (ref 1.000–1.030)
Total Protein, Urine: NEGATIVE
Urine Glucose: NEGATIVE
pH: 6.5 (ref 5.0–8.0)

## 2013-02-25 LAB — MAGNESIUM: Magnesium: 2 mg/dL (ref 1.5–2.5)

## 2013-02-25 LAB — VITAMIN B12: Vitamin B-12: 355 pg/mL (ref 211–911)

## 2013-02-25 MED ORDER — CLONAZEPAM 0.5 MG PO TABS: 0.5000 mg | ORAL_TABLET | Freq: Two times a day (BID) | ORAL | Status: DC | PRN

## 2013-02-25 MED ORDER — TRAMADOL HCL 50 MG PO TABS: 50.0000 mg | ORAL_TABLET | Freq: Two times a day (BID) | ORAL | Status: DC | PRN

## 2013-02-25 MED ORDER — UMECLIDINIUM-VILANTEROL 62.5-25 MCG/INH IN AEPB: 1.0000 | INHALATION_SPRAY | Freq: Every day | RESPIRATORY_TRACT | Status: DC

## 2013-02-25 MED ORDER — TRIAMCINOLONE ACETONIDE 0.5 % EX CREA: TOPICAL_CREAM | Freq: Three times a day (TID) | CUTANEOUS | Status: DC

## 2013-02-25 MED ORDER — TORSEMIDE 100 MG PO TABS: 50.0000 mg | ORAL_TABLET | Freq: Every day | ORAL | Status: DC

## 2013-02-25 NOTE — Assessment & Plan Note (Signed)
Labs

## 2013-02-25 NOTE — Assessment & Plan Note (Signed)
Continue with current prescription therapy as reflected on the Med list. NMR 

## 2013-02-25 NOTE — Assessment & Plan Note (Signed)
1 ppd 

## 2013-02-25 NOTE — Assessment & Plan Note (Signed)
See meds 

## 2013-02-25 NOTE — Assessment & Plan Note (Signed)
2006 Can't tol mask - not on CPAP Recliner

## 2013-02-25 NOTE — Assessment & Plan Note (Signed)
Clonazepam prn 

## 2013-02-25 NOTE — Assessment & Plan Note (Signed)
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 He was evaluated recently for chest discomfort and a Myoview scan demonstrated inferolateral infarct and ischemia. Cardiac catheterization was arranged. LHC 09/29/11: Distal left main 30%, ostial LAD 90%, LIMA-LAD patent, proximal RI occluded, ostial SVG-RI 95%, proximal RCA occluded, proximal SVG-PDA 95%, EF 55%. The patient underwent PCI with Dr. Excell Seltzer on the same date: Promus DES to the SVG-RI and Promus DES to the SVG-PDA. Labs: Hemoglobin 15.9, potassium 4.7, creatinine 0.4.

## 2013-02-25 NOTE — Assessment & Plan Note (Signed)
B severe, multifactorial

## 2013-02-25 NOTE — Patient Instructions (Signed)
[name]

## 2013-02-25 NOTE — Assessment & Plan Note (Signed)
Wt Readings from Last 3 Encounters:  02/25/13 357 lb (161.934 kg)  09/03/12 353 lb (160.12 kg)  10/13/11 348 lb 6.4 oz (158.033 kg)

## 2013-02-25 NOTE — Assessment & Plan Note (Signed)
Start Anoro Stop smoking

## 2013-02-25 NOTE — Progress Notes (Signed)
Subjective:    HPI  New pt - to re-est C/o leg sweling -- L>R x 6 mo; worse. 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 He was evaluated recently for chest discomfort and a Myoview scan demonstrated inferolateral infarct and ischemia. Cardiac catheterization was arranged. LHC 09/29/11: Distal left main 30%, ostial LAD 90%, LIMA-LAD patent, proximal RI occluded, ostial SVG-RI 95%, proximal RCA occluded, proximal SVG-PDA 95%, EF 55%. The patient underwent PCI with Dr. Excell Seltzer on the same date: Promus DES to the SVG-RI and Promus DES to the SVG-PDA. Labs: Hemoglobin 15.9, potassium 4.7, creatinine 0.4.    Wt Readings from Last 3 Encounters:  02/25/13 357 lb (161.934 kg)  09/03/12 353 lb (160.12 kg)  10/13/11 348 lb 6.4 oz (158.033 kg)   BP Readings from Last 3 Encounters:  02/25/13 138/80  09/03/12 142/90  10/13/11 120/88      Review of Systems  Constitutional: Positive for fatigue and unexpected weight change. Negative for appetite change.  HENT: Positive for sinus pressure. Negative for nosebleeds, congestion, sore throat, sneezing, trouble swallowing and neck pain.   Eyes: Negative for itching and visual disturbance.  Respiratory: Positive for apnea and wheezing. Negative for cough.   Cardiovascular: Negative for chest pain, palpitations and leg swelling.  Gastrointestinal: Negative for nausea, vomiting, diarrhea, blood in stool and abdominal distention.  Genitourinary: Positive for decreased urine volume. Negative for frequency and hematuria.  Musculoskeletal: Positive for joint swelling. Negative for back pain and gait problem.  Skin: Positive for rash and wound.  Neurological: Negative for dizziness,  tremors, speech difficulty and weakness.  Psychiatric/Behavioral: Negative for suicidal ideas, sleep disturbance, dysphoric mood and agitation. The patient is not nervous/anxious.        Objective:   Physical Exam  Constitutional: He is oriented to person, place, and time. He appears well-developed. No distress.  NAD Obese  HENT:  Mouth/Throat: Oropharynx is clear and moist.  Eyes: Conjunctivae are normal. Pupils are equal, round, and reactive to light.  Neck: Normal range of motion. No JVD present. No thyromegaly present.  Cardiovascular: Normal rate, regular rhythm, normal heart sounds and intact distal pulses.  Exam reveals no gallop and no friction rub.   No murmur heard. Pulmonary/Chest: Effort normal. No respiratory distress. He has wheezes. He has no rales. He exhibits no tenderness.  Abdominal: Soft. Bowel sounds are normal. He exhibits no distension and no mass. There is no tenderness. There is no rebound and no guarding.  Musculoskeletal: Normal range of motion. He exhibits edema. He exhibits no tenderness.  2-3+ B  Lymphadenopathy:    He has no cervical adenopathy.  Neurological: He is alert and oriented to person, place, and time. He has normal reflexes. No cranial nerve deficit. He exhibits normal muscle tone. He displays a negative Romberg sign. Coordination and gait normal.  No meningeal signs  Skin: Skin is warm and dry. Rash noted. There is erythema.  hyperpigmented swollen ankles; L ulcers  Psychiatric: He has a normal mood and affect. His behavior is normal. Judgment and thought content normal.      I personally provided Anoro inhaler use teaching. After the teaching patient was able to demonstrate it's use effectively. All questions were answered  Assessment & Plan:   A complex case. Chart, labs, tests reviewed

## 2013-02-25 NOTE — Assessment & Plan Note (Addendum)
2014  Demadex Coban/telfa/triamcinolone

## 2013-03-18 ENCOUNTER — Encounter: Payer: Self-pay | Admitting: Internal Medicine

## 2013-03-18 ENCOUNTER — Other Ambulatory Visit (INDEPENDENT_AMBULATORY_CARE_PROVIDER_SITE_OTHER): Payer: BC Managed Care – PPO

## 2013-03-18 ENCOUNTER — Ambulatory Visit (INDEPENDENT_AMBULATORY_CARE_PROVIDER_SITE_OTHER): Payer: BC Managed Care – PPO | Admitting: Internal Medicine

## 2013-03-18 ENCOUNTER — Other Ambulatory Visit: Payer: Self-pay | Admitting: *Deleted

## 2013-03-18 VITALS — BP 140/74 | HR 76 | Temp 97.3°F | Resp 16 | Wt 341.0 lb

## 2013-03-18 DIAGNOSIS — F411 Generalized anxiety disorder: Secondary | ICD-10-CM

## 2013-03-18 DIAGNOSIS — J449 Chronic obstructive pulmonary disease, unspecified: Secondary | ICD-10-CM

## 2013-03-18 DIAGNOSIS — Z720 Tobacco use: Secondary | ICD-10-CM

## 2013-03-18 DIAGNOSIS — I83029 Varicose veins of left lower extremity with ulcer of unspecified site: Secondary | ICD-10-CM

## 2013-03-18 DIAGNOSIS — E1159 Type 2 diabetes mellitus with other circulatory complications: Secondary | ICD-10-CM

## 2013-03-18 DIAGNOSIS — R609 Edema, unspecified: Secondary | ICD-10-CM

## 2013-03-18 DIAGNOSIS — I10 Essential (primary) hypertension: Secondary | ICD-10-CM

## 2013-03-18 DIAGNOSIS — L97929 Non-pressure chronic ulcer of unspecified part of left lower leg with unspecified severity: Secondary | ICD-10-CM

## 2013-03-18 DIAGNOSIS — I872 Venous insufficiency (chronic) (peripheral): Secondary | ICD-10-CM

## 2013-03-18 DIAGNOSIS — E785 Hyperlipidemia, unspecified: Secondary | ICD-10-CM

## 2013-03-18 DIAGNOSIS — F17200 Nicotine dependence, unspecified, uncomplicated: Secondary | ICD-10-CM

## 2013-03-18 DIAGNOSIS — I83009 Varicose veins of unspecified lower extremity with ulcer of unspecified site: Secondary | ICD-10-CM

## 2013-03-18 DIAGNOSIS — G4730 Sleep apnea, unspecified: Secondary | ICD-10-CM

## 2013-03-18 DIAGNOSIS — J4489 Other specified chronic obstructive pulmonary disease: Secondary | ICD-10-CM

## 2013-03-18 DIAGNOSIS — I25810 Atherosclerosis of coronary artery bypass graft(s) without angina pectoris: Secondary | ICD-10-CM

## 2013-03-18 DIAGNOSIS — E669 Obesity, unspecified: Secondary | ICD-10-CM

## 2013-03-18 LAB — IBC PANEL
Saturation Ratios: 11.9 % — ABNORMAL LOW (ref 20.0–50.0)
Transferrin: 281.7 mg/dL (ref 212.0–360.0)

## 2013-03-18 LAB — BASIC METABOLIC PANEL
BUN: 20 mg/dL (ref 6–23)
Creatinine, Ser: 0.9 mg/dL (ref 0.4–1.5)
Potassium: 4.1 mEq/L (ref 3.5–5.1)

## 2013-03-18 NOTE — Assessment & Plan Note (Signed)
Continue with current prescription therapy as reflected on the Med list.  

## 2013-03-18 NOTE — Assessment & Plan Note (Signed)
Can't tol mask - not on CPAP - refused

## 2013-03-18 NOTE — Progress Notes (Signed)
   Subjective:    HPI   F/u leg sweling -- L>R x 6 mo; better. 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 He was evaluated recently for chest discomfort and a Myoview scan demonstrated inferolateral infarct and ischemia. Cardiac catheterization was arranged. LHC 09/29/11: Distal left main 30%, ostial LAD 90%, LIMA-LAD patent, proximal RI occluded, ostial SVG-RI 95%, proximal RCA occluded, proximal SVG-PDA 95%, EF 55%. The patient underwent PCI with Dr. Excell Seltzer on the same date: Promus DES to the SVG-RI and Promus DES to the SVG-PDA. Labs: Hemoglobin 15.9, potassium 4.7, creatinine 0.4.    Wt Readings from Last 3 Encounters:  03/18/13 341 lb (154.677 kg)  02/25/13 357 lb (161.934 kg)  09/03/12 353 lb (160.12 kg)   BP Readings from Last 3 Encounters:  03/18/13 140/74  02/25/13 138/80  09/03/12 142/90      Review of Systems  Constitutional: Positive for unexpected weight change. Negative for appetite change and fatigue.  HENT: Positive for sinus pressure. Negative for nosebleeds, congestion, sore throat, sneezing, trouble swallowing and neck pain.   Eyes: Negative for itching and visual disturbance.  Respiratory: Negative for apnea, cough and wheezing.   Cardiovascular: Negative for chest pain, palpitations and leg swelling.  Gastrointestinal: Negative for nausea, vomiting, diarrhea, blood in stool and abdominal distention.  Genitourinary: Negative for frequency, hematuria and decreased urine volume.  Musculoskeletal: Negative for back pain, joint swelling and gait problem.  Skin: Positive for rash and wound (better).  Neurological: Negative for dizziness, tremors, speech difficulty and weakness.   Psychiatric/Behavioral: Negative for suicidal ideas, sleep disturbance, dysphoric mood and agitation. The patient is not nervous/anxious.        Objective:   Physical Exam  Constitutional: He is oriented to person, place, and time. He appears well-developed. No distress.  NAD Obese  HENT:  Mouth/Throat: Oropharynx is clear and moist.  Eyes: Conjunctivae are normal. Pupils are equal, round, and reactive to light.  Neck: Normal range of motion. No JVD present. No thyromegaly present.  Cardiovascular: Normal rate, regular rhythm, normal heart sounds and intact distal pulses.  Exam reveals no gallop and no friction rub.   No murmur heard. Pulmonary/Chest: Effort normal. No respiratory distress. He has no wheezes. He has no rales. He exhibits no tenderness.  Abdominal: Soft. Bowel sounds are normal. He exhibits no distension and no mass. There is no tenderness. There is no rebound and no guarding.  Musculoskeletal: Normal range of motion. He exhibits edema. He exhibits no tenderness.  2-3+ B  Lymphadenopathy:    He has no cervical adenopathy.  Neurological: He is alert and oriented to person, place, and time. He has normal reflexes. No cranial nerve deficit. He exhibits normal muscle tone. He displays a negative Romberg sign. Coordination and gait normal.  No meningeal signs  Skin: Skin is warm and dry. Rash noted. There is erythema.  hyperpigmented swollen ankles; L ulcers - better  Psychiatric: He has a normal mood and affect. His behavior is normal. Judgment and thought content normal.            Assessment & Plan:

## 2013-03-18 NOTE — Assessment & Plan Note (Signed)
Better Continue with current prescription therapy as reflected on the Med list.  

## 2013-03-18 NOTE — Assessment & Plan Note (Signed)
Better on Anoro

## 2013-03-18 NOTE — Assessment & Plan Note (Addendum)
Better Continue with current prescription therapy as reflected on the Med list.  

## 2013-03-18 NOTE — Assessment & Plan Note (Signed)
Dicussed Trying e cigs

## 2013-03-19 LAB — NMR LIPOPROFILE WITHOUT LIPIDS
HDL Particle Number: 21.4 umol/L — ABNORMAL LOW (ref >=30.5)
LDL Particle Number: 1301 nmol/L — ABNORMAL HIGH (ref <1000)
Large VLDL-P: 4.5 nmol/L — ABNORMAL HIGH (ref <=2.7)
VLDL Size: 50.4 nm — ABNORMAL HIGH (ref <=46.6)

## 2013-03-20 ENCOUNTER — Other Ambulatory Visit: Payer: Self-pay | Admitting: *Deleted

## 2013-03-20 MED ORDER — SIMVASTATIN 20 MG PO TABS: 20.0000 mg | ORAL_TABLET | Freq: Every day | ORAL | Status: DC

## 2013-04-14 ENCOUNTER — Other Ambulatory Visit: Payer: Self-pay | Admitting: Cardiovascular Disease

## 2013-04-28 ENCOUNTER — Other Ambulatory Visit: Payer: Self-pay | Admitting: Internal Medicine

## 2013-04-28 NOTE — Telephone Encounter (Signed)
Ok to refill? Last OV 8.5.14 Last filled 7.15.14

## 2013-05-15 ENCOUNTER — Other Ambulatory Visit: Payer: Self-pay | Admitting: Cardiovascular Disease

## 2013-06-04 ENCOUNTER — Ambulatory Visit: Payer: BC Managed Care – PPO | Admitting: Cardiovascular Disease

## 2013-06-18 ENCOUNTER — Ambulatory Visit (INDEPENDENT_AMBULATORY_CARE_PROVIDER_SITE_OTHER): Payer: BC Managed Care – PPO | Admitting: Cardiovascular Disease

## 2013-06-18 ENCOUNTER — Encounter: Payer: Self-pay | Admitting: Cardiovascular Disease

## 2013-06-18 ENCOUNTER — Ambulatory Visit: Payer: BC Managed Care – PPO | Admitting: Internal Medicine

## 2013-06-18 VITALS — BP 138/68 | HR 60 | Ht 72.0 in | Wt 339.8 lb

## 2013-06-18 DIAGNOSIS — I25810 Atherosclerosis of coronary artery bypass graft(s) without angina pectoris: Secondary | ICD-10-CM

## 2013-06-18 DIAGNOSIS — R0989 Other specified symptoms and signs involving the circulatory and respiratory systems: Secondary | ICD-10-CM

## 2013-06-18 DIAGNOSIS — I872 Venous insufficiency (chronic) (peripheral): Secondary | ICD-10-CM

## 2013-06-18 DIAGNOSIS — Z0289 Encounter for other administrative examinations: Secondary | ICD-10-CM

## 2013-06-18 DIAGNOSIS — I10 Essential (primary) hypertension: Secondary | ICD-10-CM

## 2013-06-18 DIAGNOSIS — E785 Hyperlipidemia, unspecified: Secondary | ICD-10-CM

## 2013-06-18 NOTE — Assessment & Plan Note (Signed)
Stable with no angina and good activity level.  Continue medical Rx Continue ASA and Plavix

## 2013-06-18 NOTE — Assessment & Plan Note (Signed)
Known moderate disease f/u duplex in April

## 2013-06-18 NOTE — Assessment & Plan Note (Signed)
Well controlled.  Continue current medications and low sodium Dash type diet.    

## 2013-06-18 NOTE — Progress Notes (Signed)
Patient ID: [name], male   DOB: 13-Oct-1955, 57 y.o.   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 He was evaluated recently for chest discomfort and a Myoview scan demonstrated inferolateral infarct and ischemia. Cardiac catheterization was arranged. LHC 09/29/11: Distal left main 30%, ostial LAD 90%, LIMA-LAD patent, proximal RI occluded, ostial SVG-RI 95%, proximal RCA occluded, proximal SVG-PDA 95%, EF 55%. The patient underwent PCI with Dr. Excell Seltzer on the same date: Promus DES to the SVG-RI and Promus DES to the SVG-PDA. Labs: Hemoglobin 15.9, potassium 4.7, creatinine 0.4.  Since discharge, he is doing well. The patient denies chest pain, shortness of breath, syncope, orthopnea, PND. He has chronic LE edema that is unchanged. No pain at cath site in right groin. Frustrated because he cannot lose weight. Also, wants to quit smoking. He had side effects to Chantix. Trying to eat better. No chest pain  Diuretic increased by Dr Paulette Blanch for LE edema and non healing wound on LLE   LFT;s normal 7/14  March 14 bilateral ICA 40-59%   ROS: Denies fever, malais, weight loss, blurry vision, decreased visual acuity, cough, sputum, SOB, hemoptysis, pleuritic pain, palpitaitons, heartburn, abdominal pain, melena, lower extremity edema, claudication, or rash.  All other systems reviewed and negative  General: Affect appropriate Obese male  HEENT: normal Neck supple with no adenopathy JVP normal bilateral  bruits no thyromegaly Lungs clear with no wheezing and good diaphragmatic motion Heart:  S1/S2 no murmur, no rub, gallop or click PMI normal Abdomen: benighn, BS positve, no tenderness, no AAA no bruit.  No HSM or HJR Distal pulses intact with no bruits Plus two bilateral  Edema small blister LLE shin Neuro  non-focal Skin warm and dry No muscular weakness   Current Outpatient Prescriptions  Medication Sig Dispense Refill  . albuterol (PROVENTIL,VENTOLIN) 90 MCG/ACT inhaler Inhale 2 puffs into the lungs every 6 (six) hours as needed for wheezing.  17 g  6  . aspirin EC 81 MG tablet Take 81 mg by mouth daily.      . clonazePAM (KLONOPIN) 0.5 MG tablet Take 1 tablet (0.5 mg total) by mouth 2 (two) times daily as needed for anxiety.  60 tablet  3  . clopidogrel (PLAVIX) 75 MG tablet TAKE 1 TABLET BY MOUTH DAILY WITH BREAKFAST.  30 tablet  6  . metoprolol succinate (TOPROL-XL) 50 MG 24 hr tablet TAKE 2 TABLETS EVERY DAY  60 tablet  11  . naproxen sodium (ANAPROX) 220 MG tablet Take 220 mg by mouth as needed.      . nitroGLYCERIN (NITROSTAT) 0.4 MG SL tablet Place 1 tablet (0.4 mg total) under the tongue every 5 (five) minutes as needed for chest pain (Up to 3 doses).  25 tablet  4  . potassium chloride SA (K-DUR,KLOR-CON) 20 MEQ tablet TAKE 1 TABLET (20 MEQ TOTAL) BY MOUTH DAILY.  30 tablet  1  . simvastatin (ZOCOR) 20 MG tablet Take 1 tablet (20 mg total) by mouth at bedtime.  30 tablet  3  . torsemide (DEMADEX) 100 MG tablet Take 0.5-1 tablets (50-100 mg total) by mouth daily.  90 tablet  2  . traMADol (ULTRAM) 50 MG tablet TAKE 1 TO 2 TABLETS BY MOUTH TWICE A DAY AS NEEDED FOR PAIN  120 tablet  1  . triamcinolone cream (KENALOG) 0.5 % Apply topically 3 (  three) times daily.  90 g  3  . Umeclidinium-Vilanterol (ANORO ELLIPTA) 62.5-25 MCG/INH AEPB Inhale 1 Act into the lungs daily.  1 each  11   No current facility-administered medications for this visit.    Allergies  Heparin  Electrocardiogram:  SR rate 60 RBBB   Assessment and Plan

## 2013-06-18 NOTE — Assessment & Plan Note (Signed)
Cholesterol is at goal.  Continue current dose of statin and diet Rx.  No myalgias or side effects.  F/U  LFT's in 6 months. Lab Results  Component Value Date   LDLCALC 93 09/15/2011             

## 2013-06-18 NOTE — Patient Instructions (Addendum)
Your physician wants you to follow-up in:   6 MONTHS WITH  DR Haywood Filler will receive a reminder letter in the mail two months in advance. If you don't receive a letter, please call our office to schedule the follow-up appointment. Your physician recommends that you continue on your current medications as directed. Please refer to the Current Medication list given to you today.  Your physician has requested that you have a carotid duplex. This test is an ultrasound of the carotid arteries in your neck. It looks at blood flow through these arteries that supply the brain with blood. Allow one hour for this exam. There are no restrictions or special instructions. MARCH  2015

## 2013-06-18 NOTE — Assessment & Plan Note (Signed)
Continue higher dose diuretic and compression stocking f/u [facility]

## 2013-07-12 ENCOUNTER — Other Ambulatory Visit: Payer: Self-pay | Admitting: Cardiovascular Disease

## 2013-07-12 ENCOUNTER — Other Ambulatory Visit: Payer: Self-pay | Admitting: Internal Medicine

## 2013-07-24 ENCOUNTER — Other Ambulatory Visit: Payer: Self-pay | Admitting: Internal Medicine

## 2013-09-16 ENCOUNTER — Telehealth: Payer: Self-pay | Admitting: Cardiovascular Disease

## 2013-09-16 ENCOUNTER — Telehealth: Payer: Self-pay | Admitting: *Deleted

## 2013-09-16 NOTE — Telephone Encounter (Signed)
INFORMED  PT  TO  CALL PMD  OR  PULMONARY FOR   LETTER   STATES  CPAP WAS  ORDERED  AFTER  HEART  SURGERY AND  WAS ORDERED  THROUGH THIS OFFICE  AND  ONLY USED FOR  COUPLE OF MONTHS WILL REVIEW  WITH  DR Johnsie Cancel  BUT  INFORMED THIS  WILL PROBABLY   NEED TO  COME  FROM PULMONARY .Adonis Housekeeper

## 2013-09-16 NOTE — Telephone Encounter (Signed)
New message  Patient hasn't used CPAP since 2006. 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 Please call and advise.

## 2013-09-16 NOTE — Telephone Encounter (Signed)
Patient phoned requesting that someone call him back in regards to an order for CPAP in his history that he'd like removed, since he no longer uses it.  He also inquired about DOT physicals.  Stated he'd really like for Erline Levine to return his call.  CB# [phone]

## 2013-09-17 NOTE — Telephone Encounter (Signed)
We can't remove info from chart When was he using CPAP and when and why he stopped to use it? Thx

## 2013-09-17 NOTE — Telephone Encounter (Signed)
Pt needs a letter stating he does not need or use a CPAP to be mailed. Please advise.

## 2013-09-17 NOTE — Telephone Encounter (Signed)
Pulmonary needs to follow CPAP and order

## 2013-09-18 ENCOUNTER — Telehealth: Payer: Self-pay | Admitting: Internal Medicine

## 2013-09-18 NOTE — Telephone Encounter (Signed)
Pt informed. He states he was given CPAP back in 2005-06. He could not answer questions below. He stats he will call back and schedule DOT physical and discuss this issue with MD then.

## 2013-09-18 NOTE — Telephone Encounter (Signed)
Pt wants to know if you still do D O T physicals.  The patient has the form.

## 2013-09-18 NOTE — Telephone Encounter (Signed)
Ok Thx 

## 2013-09-18 NOTE — Telephone Encounter (Signed)
Noted. Thx.

## 2013-09-19 NOTE — Telephone Encounter (Signed)
Scheduled for Monday Feb 9.

## 2013-09-19 NOTE — Telephone Encounter (Signed)
PT  AWARE./CY 

## 2013-09-22 ENCOUNTER — Encounter: Payer: BC Managed Care – PPO | Admitting: Internal Medicine

## 2013-09-22 ENCOUNTER — Encounter: Payer: Self-pay | Admitting: Internal Medicine

## 2013-09-22 ENCOUNTER — Ambulatory Visit (INDEPENDENT_AMBULATORY_CARE_PROVIDER_SITE_OTHER): Payer: BC Managed Care – PPO | Admitting: Internal Medicine

## 2013-09-22 VITALS — BP 142/70 | HR 80 | Temp 98.7°F | Resp 16 | Ht 72.0 in | Wt 341.0 lb

## 2013-09-22 DIAGNOSIS — I10 Essential (primary) hypertension: Secondary | ICD-10-CM

## 2013-09-22 DIAGNOSIS — Z23 Encounter for immunization: Secondary | ICD-10-CM

## 2013-09-22 DIAGNOSIS — N529 Male erectile dysfunction, unspecified: Secondary | ICD-10-CM | POA: Insufficient documentation

## 2013-09-22 DIAGNOSIS — E669 Obesity, unspecified: Secondary | ICD-10-CM

## 2013-09-22 DIAGNOSIS — E1159 Type 2 diabetes mellitus with other circulatory complications: Secondary | ICD-10-CM

## 2013-09-22 DIAGNOSIS — Z720 Tobacco use: Secondary | ICD-10-CM

## 2013-09-22 DIAGNOSIS — G4730 Sleep apnea, unspecified: Secondary | ICD-10-CM

## 2013-09-22 DIAGNOSIS — F17200 Nicotine dependence, unspecified, uncomplicated: Secondary | ICD-10-CM

## 2013-09-22 DIAGNOSIS — I25810 Atherosclerosis of coronary artery bypass graft(s) without angina pectoris: Secondary | ICD-10-CM

## 2013-09-22 MED ORDER — VARDENAFIL HCL 20 MG PO TABS: 20.0000 mg | ORAL_TABLET | Freq: Every day | ORAL | Status: DC | PRN

## 2013-09-22 MED ORDER — CLONAZEPAM 0.5 MG PO TABS: ORAL_TABLET | ORAL | Status: DC

## 2013-09-22 NOTE — Assessment & Plan Note (Signed)
Wt Readings from Last 3 Encounters:  09/22/13 341 lb (154.677 kg)  06/18/13 339 lb 12.8 oz (154.132 kg)  03/18/13 341 lb (154.677 kg)

## 2013-09-22 NOTE — Assessment & Plan Note (Signed)
Continue with current prescription therapy as reflected on the Med list.  

## 2013-09-22 NOTE — Progress Notes (Signed)
Subjective:    HPI  F/u OSA - he hasn't use CPAP since 2006 - he couldn't tolerate CPAP, he failed DOT exam at [facility]  F/u leg sweling -- L>R x 6 mo; better. 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 He was evaluated recently for chest discomfort and a Myoview scan demonstrated inferolateral infarct and ischemia. Cardiac catheterization was arranged. LHC 09/29/11: Distal left main 30%, ostial LAD 90%, LIMA-LAD patent, proximal RI occluded, ostial SVG-RI 95%, proximal RCA occluded, proximal SVG-PDA 95%, EF 55%. The patient underwent PCI with Dr. Burt Knack on the same date: Promus DES to the SVG-RI and Promus DES to the SVG-PDA. Labs: Hemoglobin 15.9, potassium 4.7, creatinine 0.4.    Wt Readings from Last 3 Encounters:  09/22/13 341 lb (154.677 kg)  06/18/13 339 lb 12.8 oz (154.132 kg)  03/18/13 341 lb (154.677 kg)   BP Readings from Last 3 Encounters:  09/22/13 142/70  06/18/13 138/68  03/18/13 140/74      Review of Systems  Constitutional: Positive for unexpected weight change. Negative for appetite change and fatigue.  HENT: Negative for congestion, nosebleeds, sinus pressure, sneezing, sore throat and trouble swallowing.   Eyes: Negative for itching and visual disturbance.  Respiratory: Negative for apnea, cough and wheezing.   Cardiovascular: Negative for chest pain, palpitations and leg swelling.  Gastrointestinal: Negative for nausea, vomiting, diarrhea, blood in stool and abdominal distention.  Genitourinary: Negative for frequency, hematuria and decreased urine volume.  Musculoskeletal: Negative for back pain, gait problem, joint swelling and neck pain.  Skin: Positive for wound (better).  Negative for rash.  Neurological: Negative for dizziness, tremors, speech difficulty and weakness.  Psychiatric/Behavioral: Negative for suicidal ideas, sleep disturbance, dysphoric mood and agitation. The patient is not nervous/anxious.        Objective:   Physical Exam  Constitutional: He is oriented to person, place, and time. He appears well-developed. No distress.  NAD Obese  HENT:  Mouth/Throat: Oropharynx is clear and moist.  Eyes: Conjunctivae are normal. Pupils are equal, round, and reactive to light.  Neck: Normal range of motion. No JVD present. No thyromegaly present.  Cardiovascular: Normal rate, regular rhythm, normal heart sounds and intact distal pulses.  Exam reveals no gallop and no friction rub.   No murmur heard. Pulmonary/Chest: Effort normal. No respiratory distress. He has no wheezes. He has no rales. He exhibits no tenderness.  Abdominal: Soft. Bowel sounds are normal. He exhibits no distension and no mass. There is no tenderness. There is no rebound and no guarding.  Musculoskeletal: Normal range of motion. He exhibits edema. He exhibits no tenderness.  2-3+ B  Lymphadenopathy:    He has no cervical adenopathy.  Neurological: He is alert and oriented to person, place, and time. He has normal reflexes. No cranial nerve deficit. He exhibits normal muscle tone. He displays a negative Romberg sign. Coordination and gait normal.  No meningeal signs  Skin: Skin is warm and dry. Rash noted. There is erythema.  hyperpigmented swollen ankles; L ulcers - better  Psychiatric: He has a normal mood and affect. His behavior is normal. Judgment and thought content normal.            Assessment & Plan:

## 2013-09-22 NOTE — Assessment & Plan Note (Signed)
Discussed. Unable to quit

## 2013-09-22 NOTE — Assessment & Plan Note (Addendum)
Will try Levitra No CP

## 2013-09-22 NOTE — Progress Notes (Signed)
Pre visit review using our clinic review tool, if applicable. No additional management support is needed unless otherwise documented below in the visit note. 

## 2013-09-22 NOTE — Assessment & Plan Note (Signed)
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

## 2013-11-06 ENCOUNTER — Other Ambulatory Visit: Payer: Self-pay

## 2013-11-06 MED ORDER — CLOPIDOGREL BISULFATE 75 MG PO TABS: ORAL_TABLET | ORAL | Status: DC

## 2013-11-30 ENCOUNTER — Other Ambulatory Visit: Payer: Self-pay | Admitting: Internal Medicine

## 2013-12-02 ENCOUNTER — Other Ambulatory Visit: Payer: Self-pay

## 2013-12-02 ENCOUNTER — Other Ambulatory Visit: Payer: Self-pay | Admitting: Internal Medicine

## 2013-12-02 MED ORDER — METOPROLOL SUCCINATE ER 50 MG PO TB24: ORAL_TABLET | ORAL | Status: DC

## 2013-12-03 ENCOUNTER — Other Ambulatory Visit: Payer: Self-pay | Admitting: Internal Medicine

## 2013-12-03 ENCOUNTER — Other Ambulatory Visit: Payer: Self-pay

## 2013-12-03 MED ORDER — POTASSIUM CHLORIDE CRYS ER 20 MEQ PO TBCR: EXTENDED_RELEASE_TABLET | ORAL | Status: DC

## 2013-12-19 ENCOUNTER — Encounter: Payer: Self-pay | Admitting: Pulmonary Disease

## 2014-01-06 ENCOUNTER — Ambulatory Visit (INDEPENDENT_AMBULATORY_CARE_PROVIDER_SITE_OTHER): Payer: BC Managed Care – PPO | Admitting: Cardiovascular Disease

## 2014-01-06 ENCOUNTER — Encounter: Payer: Self-pay | Admitting: Cardiovascular Disease

## 2014-01-06 VITALS — BP 156/56 | HR 67 | Ht 72.0 in | Wt 344.8 lb

## 2014-01-06 DIAGNOSIS — I872 Venous insufficiency (chronic) (peripheral): Secondary | ICD-10-CM

## 2014-01-06 DIAGNOSIS — E785 Hyperlipidemia, unspecified: Secondary | ICD-10-CM

## 2014-01-06 DIAGNOSIS — I25810 Atherosclerosis of coronary artery bypass graft(s) without angina pectoris: Secondary | ICD-10-CM

## 2014-01-06 DIAGNOSIS — E1159 Type 2 diabetes mellitus with other circulatory complications: Secondary | ICD-10-CM

## 2014-01-06 DIAGNOSIS — I10 Essential (primary) hypertension: Secondary | ICD-10-CM

## 2014-01-06 DIAGNOSIS — R0989 Other specified symptoms and signs involving the circulatory and respiratory systems: Secondary | ICD-10-CM

## 2014-01-06 MED ORDER — NITROGLYCERIN 0.4 MG SL SUBL: 0.4000 mg | SUBLINGUAL_TABLET | SUBLINGUAL | Status: DC | PRN

## 2014-01-06 MED ORDER — POTASSIUM CHLORIDE CRYS ER 20 MEQ PO TBCR: EXTENDED_RELEASE_TABLET | ORAL | Status: DC

## 2014-01-06 NOTE — Assessment & Plan Note (Signed)
Cholesterol is at goal.  Continue current dose of statin and diet Rx.  No myalgias or side effects.  F/U  LFT's in 6 months. Lab Results  Component Value Date   LDLCALC 93 09/15/2011

## 2014-01-06 NOTE — Assessment & Plan Note (Signed)
Stable with no angina and good activity level.  Continue medical Rx  

## 2014-01-06 NOTE — Assessment & Plan Note (Signed)
Discussed low carb diet.  Target hemoglobin A1c is 6.5 or less.  Continue current medications.  

## 2014-01-06 NOTE — Assessment & Plan Note (Signed)
F/U duplex 6 months bilateral 40-59% disease

## 2014-01-06 NOTE — Assessment & Plan Note (Signed)
Continue demedex  Refill for klor kon called in

## 2014-01-06 NOTE — Progress Notes (Signed)
Patient ID: [name], male   DOB: Jan 06, 1956, 58 y.o.   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 He was evaluated recently for chest discomfort and a Myoview scan demonstrated inferolateral infarct and ischemia. Cardiac catheterization was arranged.   LHC 09/29/11: Distal left main 30%, ostial LAD 90%, LIMA-LAD patent, proximal RI occluded, ostial SVG-RI 95%, proximal RCA occluded, proximal SVG-PDA 95%, EF 55%. The patient underwent PCI with Dr. Burt Knack on the same date: Promus DES to the SVG-RI and Promus DES to the SVG-PDA. Labs: Hemoglobin 15.9, potassium 4.7, creatinine 0.4.  Since discharge, he is doing well. The patient denies chest pain, shortness of breath, syncope, orthopnea, PND. He has chronic LE edema that is unchanged. No pain at cath site in right groin. Frustrated because he cannot lose weight. Also, wants to quit smoking. He had side effects to Chantix.  "Vaping"  Now  Trying to eat better. No chest pain Diuretic increased by Dr Laurian Brim for LE edema and non healing wound on LLE   LFT;s normal 7/14  March 14 bilateral ICA 40-59%   No chest pain  Seems to have given up on weight loss    ROS: Denies fever, malais, weight loss, blurry vision, decreased visual acuity, cough, sputum, SOB, hemoptysis, pleuritic pain, palpitaitons, heartburn, abdominal pain, melena, lower extremity edema, claudication, or rash.  All other systems reviewed and negative  General: Affect appropriate Obese white male  HEENT: normal Neck supple with no adenopathy JVP normal no bruits no thyromegaly Lungs clear with no wheezing and good diaphragmatic motion Heart:  F6/E3 systolic  murmur, no rub, gallop or click PMI normal Abdomen: benighn, BS positve, no tenderness, no AAA no bruit.  No HSM or HJR Distal  pulses intact with no bruits Plus 2 bilateral  Edema with venous stasis  Neuro non-focal Skin warm and dry No muscular weakness   Current Outpatient Prescriptions  Medication Sig Dispense Refill  . aspirin EC 81 MG tablet Take 81 mg by mouth daily.      . clonazePAM (KLONOPIN) 0.5 MG tablet TAKE 1 TABLET BY MOUTH TWICE A DAY AS NEEDED FOR ANXIETY  60 tablet  3  . clopidogrel (PLAVIX) 75 MG tablet TAKE 1 TABLET BY MOUTH DAILY WITH BREAKFAST.  30 tablet  3  . metoprolol succinate (TOPROL-XL) 50 MG 24 hr tablet TAKE 2 TABLETS EVERY DAY  60 tablet  6  . naproxen sodium (ANAPROX) 220 MG tablet Take 220 mg by mouth as needed.      . potassium chloride SA (KLOR-CON M20) 20 MEQ tablet TAKE 1 TABLET BY MOUTH DAILY  30 tablet  3  . simvastatin (ZOCOR) 20 MG tablet TAKE 1 TABLET (20 MG TOTAL) BY MOUTH AT BEDTIME.  30 tablet  5  . torsemide (DEMADEX) 100 MG tablet TAKE 1/2 TO 1 TABLETS BY MOUTH DAILY  90 tablet  2  . traMADol (ULTRAM) 50 MG tablet TAKE 1 TO 2 TABLETS BY MOUTH TWICE A DAY AS NEEDED FOR PAIN  120 tablet  1  . triamcinolone cream (KENALOG) 0.5 % Apply topically 3 (three) times daily.  90 g  3  . Umeclidinium-Vilanterol (ANORO ELLIPTA) 62.5-25 MCG/INH AEPB Inhale 1 Act into the lungs daily.  1 each  11  . vardenafil (LEVITRA) 20 MG tablet Take 1 tablet (20 mg total) by mouth daily as needed  for erectile dysfunction.  10 tablet  3  . nitroGLYCERIN (NITROSTAT) 0.4 MG SL tablet Place 1 tablet (0.4 mg total) under the tongue every 5 (five) minutes as needed for chest pain (Up to 3 doses).  25 tablet  4   No current facility-administered medications for this visit.    Allergies  Heparin  Electrocardiogram:  11/5  SR rate 63 RBBB   Assessment and Plan

## 2014-01-06 NOTE — Patient Instructions (Signed)
Your physician wants you to follow-up in:  6 MONTHS WITH DR NISHAN  You will receive a reminder letter in the mail two months in advance. If you don't receive a letter, please call our office to schedule the follow-up appointment. Your physician recommends that you continue on your current medications as directed. Please refer to the Current Medication list given to you today. 

## 2014-01-06 NOTE — Assessment & Plan Note (Signed)
Well controlled.  Continue current medications and low sodium Dash type diet.    

## 2014-01-22 ENCOUNTER — Other Ambulatory Visit: Payer: Self-pay | Admitting: Internal Medicine

## 2014-01-27 NOTE — Telephone Encounter (Signed)
sch OV 

## 2014-02-16 ENCOUNTER — Telehealth: Payer: Self-pay

## 2014-02-16 DIAGNOSIS — E1159 Type 2 diabetes mellitus with other circulatory complications: Secondary | ICD-10-CM

## 2014-02-16 NOTE — Telephone Encounter (Signed)
Diabetic bundle- lipid,a1c and bmet ordered 

## 2014-02-27 ENCOUNTER — Ambulatory Visit: Payer: BC Managed Care – PPO | Admitting: Internal Medicine

## 2014-02-27 DIAGNOSIS — Z0289 Encounter for other administrative examinations: Secondary | ICD-10-CM

## 2014-03-01 ENCOUNTER — Other Ambulatory Visit: Payer: Self-pay | Admitting: Internal Medicine

## 2014-03-03 NOTE — Telephone Encounter (Signed)
Called pharmacy spoke with [name]/pharmacist gave md approval.../lmb

## 2014-03-25 ENCOUNTER — Encounter: Payer: Self-pay | Admitting: Internal Medicine

## 2014-03-25 ENCOUNTER — Ambulatory Visit: Payer: BC Managed Care – PPO

## 2014-03-25 ENCOUNTER — Ambulatory Visit (INDEPENDENT_AMBULATORY_CARE_PROVIDER_SITE_OTHER): Payer: BC Managed Care – PPO | Admitting: Internal Medicine

## 2014-03-25 VITALS — BP 140/70 | HR 72 | Temp 98.7°F | Resp 16 | Wt 343.0 lb

## 2014-03-25 DIAGNOSIS — I25810 Atherosclerosis of coronary artery bypass graft(s) without angina pectoris: Secondary | ICD-10-CM

## 2014-03-25 DIAGNOSIS — E1159 Type 2 diabetes mellitus with other circulatory complications: Secondary | ICD-10-CM

## 2014-03-25 DIAGNOSIS — E785 Hyperlipidemia, unspecified: Secondary | ICD-10-CM

## 2014-03-25 DIAGNOSIS — J449 Chronic obstructive pulmonary disease, unspecified: Secondary | ICD-10-CM

## 2014-03-25 DIAGNOSIS — I83009 Varicose veins of unspecified lower extremity with ulcer of unspecified site: Secondary | ICD-10-CM

## 2014-03-25 DIAGNOSIS — Z720 Tobacco use: Secondary | ICD-10-CM

## 2014-03-25 DIAGNOSIS — J4489 Other specified chronic obstructive pulmonary disease: Secondary | ICD-10-CM

## 2014-03-25 DIAGNOSIS — G4700 Insomnia, unspecified: Secondary | ICD-10-CM

## 2014-03-25 DIAGNOSIS — F17200 Nicotine dependence, unspecified, uncomplicated: Secondary | ICD-10-CM

## 2014-03-25 DIAGNOSIS — I10 Essential (primary) hypertension: Secondary | ICD-10-CM

## 2014-03-25 DIAGNOSIS — E669 Obesity, unspecified: Secondary | ICD-10-CM

## 2014-03-25 DIAGNOSIS — I83029 Varicose veins of left lower extremity with ulcer of unspecified site: Secondary | ICD-10-CM

## 2014-03-25 DIAGNOSIS — L97909 Non-pressure chronic ulcer of unspecified part of unspecified lower leg with unspecified severity: Secondary | ICD-10-CM

## 2014-03-25 DIAGNOSIS — L97929 Non-pressure chronic ulcer of unspecified part of left lower leg with unspecified severity: Secondary | ICD-10-CM

## 2014-03-25 LAB — BASIC METABOLIC PANEL
BUN: 14 mg/dL (ref 6–23)
CALCIUM: 9.5 mg/dL (ref 8.4–10.5)
CHLORIDE: 99 meq/L (ref 96–112)
CO2: 33 mEq/L — ABNORMAL HIGH (ref 19–32)
CREATININE: 1.1 mg/dL (ref 0.4–1.5)
GFR: 71.06 mL/min (ref >60.00)
Glucose, Bld: 88 mg/dL (ref 70–99)
Potassium: 4.3 mEq/L (ref 3.5–5.1)
Sodium: 139 mEq/L (ref 135–145)

## 2014-03-25 LAB — LIPID PANEL
Cholesterol: 131 mg/dL (ref 0–200)
HDL: 24 mg/dL — AB (ref >39.00)
LDL CALC: 70 mg/dL (ref 0–99)
NONHDL: 107
Total CHOL/HDL Ratio: 5
Triglycerides: 185 mg/dL — ABNORMAL HIGH (ref 0.0–149.0)
VLDL: 37 mg/dL (ref 0.0–40.0)

## 2014-03-25 LAB — HEMOGLOBIN A1C: HEMOGLOBIN A1C: 6 % (ref 4.6–6.5)

## 2014-03-25 MED ORDER — SIMVASTATIN 20 MG PO TABS: ORAL_TABLET | ORAL | Status: DC

## 2014-03-25 MED ORDER — CLONAZEPAM 0.5 MG PO TABS: ORAL_TABLET | ORAL | Status: DC

## 2014-03-25 MED ORDER — CLOPIDOGREL BISULFATE 75 MG PO TABS: ORAL_TABLET | ORAL | Status: DC

## 2014-03-25 MED ORDER — METOPROLOL SUCCINATE ER 50 MG PO TB24: ORAL_TABLET | ORAL | Status: DC

## 2014-03-25 NOTE — Progress Notes (Signed)
Subjective:    HPI   F/u visit   F/u OSA - he hasn't use CPAP since 2006 - he couldn't tolerate CPAP, he failed DOT exam at [facility]  F/u leg sweling -- L>R x 6 mo; better. 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 He was evaluated recently for chest discomfort and a Myoview scan demonstrated inferolateral infarct and ischemia. Cardiac catheterization was arranged. LHC 09/29/11: Distal left main 30%, ostial LAD 90%, LIMA-LAD patent, proximal RI occluded, ostial SVG-RI 95%, proximal RCA occluded, proximal SVG-PDA 95%, EF 55%. The patient underwent PCI with Dr. Burt Knack on the same date: Promus DES to the SVG-RI and Promus DES to the SVG-PDA. Labs: Hemoglobin 15.9, potassium 4.7, creatinine 0.4.    Wt Readings from Last 3 Encounters:  03/25/14 343 lb (155.584 kg)  01/06/14 344 lb 12.8 oz (156.4 kg)  09/22/13 341 lb (154.677 kg)   BP Readings from Last 3 Encounters:  03/25/14 140/70  01/06/14 156/56  09/22/13 142/70      Review of Systems  Constitutional: Positive for unexpected weight change. Negative for appetite change and fatigue.  HENT: Negative for congestion, nosebleeds, sinus pressure, sneezing, sore throat and trouble swallowing.   Eyes: Negative for itching and visual disturbance.  Respiratory: Negative for apnea, cough and wheezing.   Cardiovascular: Positive for leg swelling. Negative for chest pain and palpitations.  Gastrointestinal: Negative for nausea, vomiting, diarrhea, blood in stool and abdominal distention.  Genitourinary: Negative for frequency, hematuria and decreased urine volume.  Musculoskeletal: Negative for back pain, gait problem, joint swelling and neck pain.  Skin:  Negative for rash and wound.  Neurological: Negative for dizziness, tremors, speech difficulty and weakness.  Psychiatric/Behavioral: Negative for suicidal ideas, sleep disturbance, dysphoric mood and agitation. The patient is not nervous/anxious.        Objective:   Physical Exam  Constitutional: He is oriented to person, place, and time. He appears well-developed. No distress.  NAD Obese  HENT:  Mouth/Throat: Oropharynx is clear and moist.  Eyes: Conjunctivae are normal. Pupils are equal, round, and reactive to light.  Neck: Normal range of motion. No JVD present. No thyromegaly present.  Cardiovascular: Normal rate, regular rhythm, normal heart sounds and intact distal pulses.  Exam reveals no gallop and no friction rub.   No murmur heard. Pulmonary/Chest: Effort normal. No respiratory distress. He has no wheezes. He has no rales. He exhibits no tenderness.  Abdominal: Soft. Bowel sounds are normal. He exhibits no distension and no mass. There is no tenderness. There is no rebound and no guarding.  Musculoskeletal: Normal range of motion. He exhibits edema. He exhibits no tenderness.  Trace to 1+ B  Lymphadenopathy:    He has no cervical adenopathy.  Neurological: He is alert and oriented to person, place, and time. He has normal reflexes. No cranial nerve deficit. He exhibits normal muscle tone. He displays a negative Romberg sign. Coordination and gait normal.  Skin: Skin is warm and dry. No rash noted. No erythema.  Hyperpigmented, less swollen ankles; no ulcers   Psychiatric: He has a normal mood and affect. His behavior is normal. Judgment and thought content normal.     Lab Results  Component Value Date   WBC  11.6* 02/25/2013   HGB 17.0 02/25/2013   HCT 50.2 02/25/2013   PLT 134.0* 02/25/2013   GLUCOSE 116* 03/18/2013   CHOL 143 09/15/2011   TRIG 122.0 09/15/2011   HDL 26.00* 09/15/2011   LDLCALC 93 09/15/2011   ALT 24 02/25/2013   AST 21 02/25/2013   NA 139 03/18/2013   K 4.1  03/18/2013   CL 96 03/18/2013   CREATININE 0.9 03/18/2013   BUN 20 03/18/2013   CO2 34* 03/18/2013   TSH 2.01 02/25/2013   PSA 0.23 09/15/2011   INR 0.94 09/22/2011   HGBA1C 6.2 02/25/2013          Assessment & Plan:

## 2014-03-25 NOTE — Assessment & Plan Note (Signed)
Better  

## 2014-03-25 NOTE — Assessment & Plan Note (Signed)
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 He was evaluated recently for chest discomfort and a Myoview scan demonstrated inferolateral infarct and ischemia. Cardiac catheterization was arranged. LHC 09/29/11: Distal left main 30%, ostial LAD 90%, LIMA-LAD patent, proximal RI occluded, ostial SVG-RI 95%, proximal RCA occluded, proximal SVG-PDA 95%, EF 55%. The patient underwent PCI with Dr. Burt Knack on the same date: Promus DES to the SVG-RI and Promus DES to the SVG-PDA. Labs: Hemoglobin 15.9, potassium 4.7, creatinine 0.4.   Stress at work discussed -- 60+ h/wk

## 2014-03-25 NOTE — Assessment & Plan Note (Signed)
Discussed needs to quit 

## 2014-03-25 NOTE — Assessment & Plan Note (Signed)
Continue with current prescription therapy as reflected on the Med list.  

## 2014-03-25 NOTE — Assessment & Plan Note (Signed)
Wt Readings from Last 3 Encounters:  03/25/14 343 lb (155.584 kg)  01/06/14 344 lb 12.8 oz (156.4 kg)  09/22/13 341 lb (154.677 kg)

## 2014-03-25 NOTE — Progress Notes (Deleted)
Pre visit review using our clinic review tool, if applicable. No additional management support is needed unless otherwise documented below in the visit note. 

## 2014-03-26 ENCOUNTER — Other Ambulatory Visit: Payer: Self-pay | Admitting: Internal Medicine

## 2014-04-09 ENCOUNTER — Other Ambulatory Visit: Payer: Self-pay | Admitting: Internal Medicine

## 2014-04-23 DIAGNOSIS — Z0279 Encounter for issue of other medical certificate: Secondary | ICD-10-CM

## 2014-04-29 ENCOUNTER — Telehealth: Payer: Self-pay | Admitting: Internal Medicine

## 2014-04-29 NOTE — Telephone Encounter (Signed)
Patient is requesting a call in regards to letter to avoid jury duty.

## 2014-05-10 ENCOUNTER — Other Ambulatory Visit: Payer: Self-pay | Admitting: Internal Medicine

## 2014-06-26 ENCOUNTER — Ambulatory Visit: Payer: BC Managed Care – PPO | Admitting: Cardiovascular Disease

## 2014-07-23 ENCOUNTER — Encounter (HOSPITAL_COMMUNITY): Payer: Self-pay | Admitting: Cardiovascular Disease

## 2014-07-28 ENCOUNTER — Encounter: Payer: Self-pay | Admitting: Cardiovascular Disease

## 2014-07-28 ENCOUNTER — Ambulatory Visit (INDEPENDENT_AMBULATORY_CARE_PROVIDER_SITE_OTHER): Payer: BC Managed Care – PPO | Admitting: Cardiovascular Disease

## 2014-07-28 VITALS — BP 132/66 | HR 68 | Ht 72.0 in | Wt 345.8 lb

## 2014-07-28 DIAGNOSIS — I25810 Atherosclerosis of coronary artery bypass graft(s) without angina pectoris: Secondary | ICD-10-CM

## 2014-07-28 DIAGNOSIS — I10 Essential (primary) hypertension: Secondary | ICD-10-CM

## 2014-07-28 DIAGNOSIS — R221 Localized swelling, mass and lump, neck: Secondary | ICD-10-CM

## 2014-07-28 DIAGNOSIS — I872 Venous insufficiency (chronic) (peripheral): Secondary | ICD-10-CM

## 2014-07-28 DIAGNOSIS — I779 Disorder of arteries and arterioles, unspecified: Secondary | ICD-10-CM

## 2014-07-28 DIAGNOSIS — E785 Hyperlipidemia, unspecified: Secondary | ICD-10-CM

## 2014-07-28 DIAGNOSIS — R011 Cardiac murmur, unspecified: Secondary | ICD-10-CM

## 2014-07-28 DIAGNOSIS — I739 Peripheral vascular disease, unspecified: Secondary | ICD-10-CM

## 2014-07-28 NOTE — Assessment & Plan Note (Signed)
Known bilateral 40-59% stenosis post left CEA  F/U duplex 6 months ASA

## 2014-07-28 NOTE — Assessment & Plan Note (Signed)
Stable with no angina and good activity level.  Continue medical Rx Has nitro

## 2014-07-28 NOTE — Assessment & Plan Note (Signed)
Cholesterol is at goal.  Continue current dose of statin and diet Rx.  No myalgias or side effects.  F/U  LFT's in 6 months. Lab Results  Component Value Date   LDLCALC 70 03/25/2014

## 2014-07-28 NOTE — Assessment & Plan Note (Signed)
Stabel no skin breakdown  Continue current dose of diuretic

## 2014-07-28 NOTE — Assessment & Plan Note (Signed)
Well controlled.  Continue current medications and low sodium Dash type diet.    

## 2014-07-28 NOTE — Assessment & Plan Note (Signed)
He indicates its been there since 2010 CEA.  Saw Dr Wilburn Cornelia then and PET scan negative.  Concerned that it has gotten a lot large.  Mobile and not matted like nodes.  Firm/hard.  Should probably be removed.  Will refer back to Dr Wilburn Cornelia Patient insists on not doing anything until January because of his insurance co pay issues

## 2014-07-28 NOTE — Assessment & Plan Note (Signed)
AV sclerois  Echo 2013 no valve disease consider f/u echo in a year

## 2014-07-28 NOTE — Progress Notes (Signed)
Patient ID: [name], male   DOB: [birth year], 58 y.o.   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 He was evaluated recently for chest discomfort and a Myoview scan demonstrated inferolateral infarct and ischemia. Cardiac catheterization was arranged.  LHC 09/29/11: Distal left main 30%, ostial LAD 90%, LIMA-LAD patent, proximal RI occluded, ostial SVG-RI 95%, proximal RCA occluded, proximal SVG-PDA 95%, EF 55%. The patient underwent PCI with Dr. Burt Knack on the same date: Promus DES to the SVG-RI and Promus DES to the SVG-PDA. Labs: Hemoglobin 15.9, potassium 4.7, creatinine 0.4.  Since discharge, he is doing well. The patient denies chest pain, shortness of breath, syncope, orthopnea, PND. He has chronic LE edema that is unchanged. No pain at cath site in right groin. Frustrated because he cannot lose weight. Also, wants to quit smoking. He had side effects to Chantix. "Vaping" Now Trying to eat better. No chest pain Diuretic increased by Dr Laurian Brim for LE edema and non healing wound on LLE   LFT;s normal 7/14  March 14 bilateral ICA 40-59%   No chest pain Seems to have given up on weight loss       ROS: Denies fever, malais, weight loss, blurry vision, decreased visual acuity, cough, sputum, SOB, hemoptysis, pleuritic pain, palpitaitons, heartburn, abdominal pain, melena, lower extremity edema, claudication, or rash.  All other systems reviewed and negative  General: Affect appropriate Obese male  HEENT: huge firm nodular mass in left supraclavicular fossa posterior to CEA scar  Neck supple with no adenopathy JVP normal no bruits no thyromegaly Lungs clear with no wheezing and good diaphragmatic motion Heart:  S1/S2 SEM  murmur, no rub, gallop or click PMI normal Abdomen: benighn, BS  positve, no tenderness, no AAA no bruit.  No HSM or HJR Distal pulses intact with no bruits Plus one bilateral  edema Neuro non-focal Skin warm and dry No muscular weakness   Current Outpatient Prescriptions  Medication Sig Dispense Refill  . ANORO ELLIPTA 62.5-25 MCG/INH AEPB INHALE 1 ACT INTO THE LUNGS DAILY 60 each 5  . aspirin EC 81 MG tablet Take 81 mg by mouth daily.    . clonazePAM (KLONOPIN) 0.5 MG tablet TAKE 1 TABLET BY MOUTH TWICE A DAY AS NEEDED FOR ANXIETY or at hs for insomnia 60 tablet 3  . clopidogrel (PLAVIX) 75 MG tablet TAKE 1 TABLET BY MOUTH DAILY WITH BREAKFAST. 30 tablet 11  . metoprolol succinate (TOPROL-XL) 50 MG 24 hr tablet TAKE 2 TABLETS EVERY DAY 60 tablet 6  . naproxen sodium (ANAPROX) 220 MG tablet Take 220 mg by mouth as needed.    . nitroGLYCERIN (NITROSTAT) 0.4 MG SL tablet Place 1 tablet (0.4 mg total) under the tongue every 5 (five) minutes as needed for chest pain (Up to 3 doses). 25 tablet 4  . potassium chloride SA (KLOR-CON M20) 20 MEQ tablet TAKE 1 TABLET BY MOUTH DAILY 30 tablet 11  . simvastatin (ZOCOR) 20 MG tablet TAKE 1 TABLET (20 MG TOTAL) BY MOUTH AT BEDTIME. 30 tablet 11  . torsemide (DEMADEX) 100 MG tablet TAKE 1/2 TO 1 TABLETS BY MOUTH DAILY 90 tablet 1  . traMADol (ULTRAM) 50 MG tablet TAKE 1 TO 2 TABLETS BY MOUTH TWICE A DAY AS NEEDED FOR PAIN 120 tablet 1  . triamcinolone cream (KENALOG) 0.5 % Apply topically 3 (three) times daily. Gruver  g 3  . vardenafil (LEVITRA) 20 MG tablet Take 1 tablet (20 mg total) by mouth daily as needed for erectile dysfunction. 10 tablet 3   No current facility-administered medications for this visit.    Allergies  Heparin  Electrocardiogram:  06/27/13  SR rate 60  RBBB  Today SR rate 68  ICRBBB LAE    Assessment and Plan

## 2014-08-04 ENCOUNTER — Telehealth: Payer: Self-pay | Admitting: *Deleted

## 2014-08-04 MED ORDER — TRAMADOL HCL 50 MG PO TABS: ORAL_TABLET | ORAL | Status: DC

## 2014-08-04 NOTE — Telephone Encounter (Signed)
Medication refilled

## 2014-08-04 NOTE — Telephone Encounter (Signed)
Rf req for Tramadol 50 mg 1 or 2 po bid prn pain. Ok to Rf in PCP's absence? Thanks!

## 2014-08-04 NOTE — Telephone Encounter (Signed)
Rx faxed to pharmacy  

## 2014-08-10 ENCOUNTER — Other Ambulatory Visit: Payer: Self-pay | Admitting: Internal Medicine

## 2014-08-10 NOTE — Telephone Encounter (Signed)
Pt called in and has several questions about meds.  Requested a return call from stacy

## 2014-08-11 NOTE — Telephone Encounter (Signed)
Pt needs all meds printed for 90 day supply. Please review/sign pended meds and give to [facility]. Will call pt when Rxs are ready for p/u.

## 2014-08-12 MED ORDER — CLOPIDOGREL BISULFATE 75 MG PO TABS: ORAL_TABLET | ORAL | Status: DC

## 2014-08-12 MED ORDER — TRAMADOL HCL 50 MG PO TABS: ORAL_TABLET | ORAL | Status: DC

## 2014-08-12 MED ORDER — TORSEMIDE 100 MG PO TABS: 50.0000 mg | ORAL_TABLET | Freq: Every day | ORAL | Status: DC

## 2014-08-12 MED ORDER — UMECLIDINIUM-VILANTEROL 62.5-25 MCG/INH IN AEPB: INHALATION_SPRAY | RESPIRATORY_TRACT | Status: DC

## 2014-08-12 MED ORDER — CLONAZEPAM 0.5 MG PO TABS: ORAL_TABLET | ORAL | Status: DC

## 2014-08-12 MED ORDER — SIMVASTATIN 20 MG PO TABS: ORAL_TABLET | ORAL | Status: DC

## 2014-08-12 MED ORDER — POTASSIUM CHLORIDE CRYS ER 20 MEQ PO TBCR: EXTENDED_RELEASE_TABLET | ORAL | Status: DC

## 2014-08-12 MED ORDER — METOPROLOL SUCCINATE ER 50 MG PO TB24: ORAL_TABLET | ORAL | Status: DC

## 2014-08-12 NOTE — Telephone Encounter (Signed)
Notified pt rx's ready for pick-up../lmb 

## 2014-08-14 HISTORY — PX: PORTACATH PLACEMENT: SHX2246

## 2014-08-17 ENCOUNTER — Other Ambulatory Visit: Payer: Self-pay | Admitting: Internal Medicine

## 2014-08-18 ENCOUNTER — Telehealth: Payer: Self-pay | Admitting: Internal Medicine

## 2014-08-18 NOTE — Telephone Encounter (Signed)
Pt called in an requesting a call back from nurse.  He had some questions about all his meds and the mail order

## 2014-08-19 MED ORDER — NAPROXEN SODIUM 220 MG PO TABS: 220.0000 mg | ORAL_TABLET | ORAL | Status: DC | PRN

## 2014-08-19 NOTE — Telephone Encounter (Signed)
Pt requesting Rf on med for inflammation. I asked him if he means Naproxen. He isn't sure,but asked for a Rf of that. Rf sent to local. Pt informed

## 2014-09-14 ENCOUNTER — Other Ambulatory Visit: Payer: Self-pay | Admitting: Internal Medicine

## 2014-09-15 ENCOUNTER — Other Ambulatory Visit: Payer: Self-pay | Admitting: *Deleted

## 2014-09-15 MED ORDER — SIMVASTATIN 20 MG PO TABS: ORAL_TABLET | ORAL | Status: DC

## 2014-09-25 ENCOUNTER — Encounter: Payer: Self-pay | Admitting: Internal Medicine

## 2014-09-25 ENCOUNTER — Telehealth: Payer: Self-pay | Admitting: Internal Medicine

## 2014-09-25 ENCOUNTER — Ambulatory Visit (INDEPENDENT_AMBULATORY_CARE_PROVIDER_SITE_OTHER): Payer: BLUE CROSS/BLUE SHIELD | Admitting: Internal Medicine

## 2014-09-25 ENCOUNTER — Other Ambulatory Visit (INDEPENDENT_AMBULATORY_CARE_PROVIDER_SITE_OTHER): Payer: BLUE CROSS/BLUE SHIELD

## 2014-09-25 VITALS — BP 150/84 | HR 72 | Temp 99.1°F | Wt 346.0 lb

## 2014-09-25 DIAGNOSIS — K2100 Gastro-esophageal reflux disease with esophagitis, without bleeding: Secondary | ICD-10-CM

## 2014-09-25 DIAGNOSIS — I10 Essential (primary) hypertension: Secondary | ICD-10-CM

## 2014-09-25 DIAGNOSIS — G4730 Sleep apnea, unspecified: Secondary | ICD-10-CM

## 2014-09-25 DIAGNOSIS — Z720 Tobacco use: Secondary | ICD-10-CM

## 2014-09-25 DIAGNOSIS — E785 Hyperlipidemia, unspecified: Secondary | ICD-10-CM

## 2014-09-25 DIAGNOSIS — D485 Neoplasm of uncertain behavior of skin: Secondary | ICD-10-CM

## 2014-09-25 DIAGNOSIS — Z23 Encounter for immunization: Secondary | ICD-10-CM

## 2014-09-25 DIAGNOSIS — I25810 Atherosclerosis of coronary artery bypass graft(s) without angina pectoris: Secondary | ICD-10-CM

## 2014-09-25 LAB — BASIC METABOLIC PANEL
BUN: 13 mg/dL (ref 6–23)
CHLORIDE: 100 meq/L (ref 96–112)
CO2: 37 meq/L — AB (ref 19–32)
Calcium: 9.5 mg/dL (ref 8.4–10.5)
Creatinine, Ser: 1 mg/dL (ref 0.40–1.50)
GFR: 80.85 mL/min (ref >60.00)
GLUCOSE: 97 mg/dL (ref 70–99)
POTASSIUM: 4.4 meq/L (ref 3.5–5.1)
SODIUM: 139 meq/L (ref 135–145)

## 2014-09-25 NOTE — Telephone Encounter (Signed)
emmi mailed  °

## 2014-09-25 NOTE — Assessment & Plan Note (Signed)
Continue with current prescription therapy as reflected on the Med list.  

## 2014-09-25 NOTE — Progress Notes (Signed)
Pre visit review using our clinic review tool, if applicable. No additional management support is needed unless otherwise documented below in the visit note. 

## 2014-09-25 NOTE — Progress Notes (Signed)
Subjective:    HPI      F/u OSA - he hasn't use CPAP since 2006 - he couldn't tolerate CPAP, he failed DOT exam at [facility]  F/u leg sweling -- L>R x 6 mo; better. 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 He was evaluated recently for chest discomfort and a Myoview scan demonstrated inferolateral infarct and ischemia. Cardiac catheterization was arranged. LHC 09/29/11: Distal left main 30%, ostial LAD 90%, LIMA-LAD patent, proximal RI occluded, ostial SVG-RI 95%, proximal RCA occluded, proximal SVG-PDA 95%, EF 55%. The patient underwent PCI with Dr. Burt Knack on the same date: Promus DES to the SVG-RI and Promus DES to the SVG-PDA. Labs: Hemoglobin 15.9, potassium 4.7, creatinine 0.4.    Wt Readings from Last 3 Encounters:  09/25/14 346 lb (156.945 kg)  07/28/14 345 lb 12.8 oz (156.854 kg)  03/25/14 343 lb (155.584 kg)   BP Readings from Last 3 Encounters:  09/25/14 150/84  07/28/14 132/66  03/25/14 140/70      Review of Systems  Constitutional: Positive for unexpected weight change. Negative for appetite change and fatigue.  HENT: Negative for congestion, nosebleeds, sinus pressure, sneezing, sore throat and trouble swallowing.   Eyes: Negative for itching and visual disturbance.  Respiratory: Negative for apnea, cough and wheezing.   Cardiovascular: Positive for leg swelling. Negative for chest pain and palpitations.  Gastrointestinal: Negative for nausea, vomiting, diarrhea, blood in stool and abdominal distention.  Genitourinary: Negative for frequency, hematuria and decreased urine volume.  Musculoskeletal: Negative for back pain, joint swelling, gait problem and neck pain.  Skin: Negative  for rash and wound.  Neurological: Negative for dizziness, tremors, speech difficulty and weakness.  Psychiatric/Behavioral: Negative for suicidal ideas, sleep disturbance, dysphoric mood and agitation. The patient is not nervous/anxious.        Objective:   Physical Exam  Constitutional: He is oriented to person, place, and time. He appears well-developed. No distress.  NAD Obese  HENT:  Mouth/Throat: Oropharynx is clear and moist.  Eyes: Conjunctivae are normal. Pupils are equal, round, and reactive to light.  Neck: Normal range of motion. No JVD present. No thyromegaly present.  Cardiovascular: Normal rate, regular rhythm, normal heart sounds and intact distal pulses.  Exam reveals no gallop and no friction rub.   No murmur heard. Pulmonary/Chest: Effort normal. No respiratory distress. He has no wheezes. He has no rales. He exhibits no tenderness.  Abdominal: Soft. Bowel sounds are normal. He exhibits no distension and no mass. There is no tenderness. There is no rebound and no guarding.  Musculoskeletal: Normal range of motion. He exhibits edema. He exhibits no tenderness.  Trace to 1+ B  Lymphadenopathy:    He has no cervical adenopathy.  Neurological: He is alert and oriented to person, place, and time. He has normal reflexes. No cranial nerve deficit. He exhibits normal muscle tone. He displays a negative Romberg sign. Coordination and gait normal.  Skin: Skin is warm and dry. No rash noted. No erythema.  Hyperpigmented, less swollen ankles; no ulcers   Psychiatric: He has a normal mood and affect. His behavior is normal. Judgment and thought content normal.  R upper back ulcer   Lab Results  Component Value Date  WBC 11.6* 02/25/2013   HGB 17.0 02/25/2013   HCT 50.2 02/25/2013   PLT 134.0* 02/25/2013   GLUCOSE 88 03/25/2014   CHOL 131 03/25/2014   TRIG 185.0* 03/25/2014   HDL 24.00* 03/25/2014   LDLCALC 70 03/25/2014   ALT 24 02/25/2013   AST 21 02/25/2013   NA  139 03/25/2014   K 4.3 03/25/2014   CL 99 03/25/2014   CREATININE 1.1 03/25/2014   BUN 14 03/25/2014   CO2 33* 03/25/2014   TSH 2.01 02/25/2013   PSA 0.23 09/15/2011   INR 0.94 09/22/2011   HGBA1C 6.0 03/25/2014          Assessment & Plan:

## 2014-09-25 NOTE — Assessment & Plan Note (Addendum)
Continue with current prescription therapy as reflected on the Med list. Goal SBP<130

## 2014-09-25 NOTE — Assessment & Plan Note (Signed)
2/16 R back ulcer -- r/o ca Skin bx

## 2014-09-25 NOTE — Assessment & Plan Note (Signed)
2006 Can't tol mask - not on CPAP - refused Recliner

## 2014-09-28 ENCOUNTER — Telehealth: Payer: Self-pay | Admitting: Internal Medicine

## 2014-09-28 NOTE — Telephone Encounter (Signed)
emmi mailed  °

## 2014-10-09 ENCOUNTER — Other Ambulatory Visit: Payer: Self-pay | Admitting: Dermatology

## 2014-11-09 ENCOUNTER — Other Ambulatory Visit: Payer: Self-pay | Admitting: Otolaryngology

## 2014-11-09 DIAGNOSIS — R221 Localized swelling, mass and lump, neck: Secondary | ICD-10-CM

## 2014-11-13 ENCOUNTER — Ambulatory Visit
Admission: RE | Admit: 2014-11-13 | Discharge: 2014-11-13 | Disposition: A | Discharge status: Home / Self Care | Payer: BLUE CROSS/BLUE SHIELD | Source: Ambulatory Visit | Attending: Otolaryngology | Admitting: Otolaryngology

## 2014-11-13 DIAGNOSIS — R221 Localized swelling, mass and lump, neck: Secondary | ICD-10-CM

## 2014-11-13 MED ORDER — IOPAMIDOL (ISOVUE-300) INJECTION 61%
75.0000 mL | Freq: Once | INTRAVENOUS | Status: AC | PRN
  Administered 2014-11-13: 75 mL via INTRAVENOUS

## 2014-11-17 ENCOUNTER — Other Ambulatory Visit: Payer: Self-pay | Admitting: Otolaryngology

## 2014-11-17 ENCOUNTER — Other Ambulatory Visit (HOSPITAL_COMMUNITY)
Admission: RE | Admit: 2014-11-17 | Discharge: 2014-11-17 | Disposition: A | Discharge status: Home / Self Care | Payer: BLUE CROSS/BLUE SHIELD | Source: Ambulatory Visit | Attending: Otolaryngology | Admitting: Otolaryngology

## 2014-11-17 DIAGNOSIS — R221 Localized swelling, mass and lump, neck: Secondary | ICD-10-CM | POA: Diagnosis present

## 2014-11-25 ENCOUNTER — Telehealth: Payer: Self-pay | Admitting: Cardiovascular Disease

## 2014-11-25 NOTE — Telephone Encounter (Signed)
Spoke with Leafy Ro at Dr. Victorio Palm office and informed her ok to hold plavix 5 days before surgery. Obtained fax number to send over information [phone].

## 2014-11-25 NOTE — Telephone Encounter (Signed)
Ok to hold plavix 5 days before surgery. 

## 2014-11-25 NOTE — Telephone Encounter (Signed)
Will forward to Dr. Johnsie Cancel for review and advisement. Also need to know how long it is ok to hold Plavix for this procedure?

## 2014-11-25 NOTE — Telephone Encounter (Signed)
New Message    Dr. Victorio Palm office/ [facility] is calling to get surgical clearance for patient the surgery is scheduled for December 11, 2014 for excisional biopsy of left supraclavicular neck mast.  Please give office a call.

## 2014-11-26 ENCOUNTER — Other Ambulatory Visit: Payer: Self-pay | Admitting: Otolaryngology

## 2014-12-04 ENCOUNTER — Encounter (HOSPITAL_COMMUNITY): Payer: Self-pay

## 2014-12-04 ENCOUNTER — Encounter (HOSPITAL_COMMUNITY)
Admission: RE | Admit: 2014-12-04 | Discharge: 2014-12-04 | Disposition: A | Discharge status: Home / Self Care | Payer: BLUE CROSS/BLUE SHIELD | Source: Ambulatory Visit | Attending: Otolaryngology | Admitting: Otolaryngology

## 2014-12-04 DIAGNOSIS — R221 Localized swelling, mass and lump, neck: Secondary | ICD-10-CM | POA: Diagnosis present

## 2014-12-04 DIAGNOSIS — I10 Essential (primary) hypertension: Secondary | ICD-10-CM | POA: Diagnosis not present

## 2014-12-04 DIAGNOSIS — G4730 Sleep apnea, unspecified: Secondary | ICD-10-CM | POA: Diagnosis not present

## 2014-12-04 DIAGNOSIS — Z7982 Long term (current) use of aspirin: Secondary | ICD-10-CM | POA: Diagnosis not present

## 2014-12-04 DIAGNOSIS — E785 Hyperlipidemia, unspecified: Secondary | ICD-10-CM | POA: Diagnosis not present

## 2014-12-04 DIAGNOSIS — K219 Gastro-esophageal reflux disease without esophagitis: Secondary | ICD-10-CM | POA: Diagnosis not present

## 2014-12-04 DIAGNOSIS — I2510 Atherosclerotic heart disease of native coronary artery without angina pectoris: Secondary | ICD-10-CM | POA: Diagnosis not present

## 2014-12-04 DIAGNOSIS — J9621 Acute and chronic respiratory failure with hypoxia: Secondary | ICD-10-CM | POA: Diagnosis not present

## 2014-12-04 DIAGNOSIS — E119 Type 2 diabetes mellitus without complications: Secondary | ICD-10-CM | POA: Diagnosis not present

## 2014-12-04 DIAGNOSIS — I252 Old myocardial infarction: Secondary | ICD-10-CM | POA: Diagnosis not present

## 2014-12-04 DIAGNOSIS — F17210 Nicotine dependence, cigarettes, uncomplicated: Secondary | ICD-10-CM | POA: Diagnosis not present

## 2014-12-04 DIAGNOSIS — Z7902 Long term (current) use of antithrombotics/antiplatelets: Secondary | ICD-10-CM | POA: Diagnosis not present

## 2014-12-04 DIAGNOSIS — C8291 Follicular lymphoma, unspecified, lymph nodes of head, face, and neck: Secondary | ICD-10-CM | POA: Diagnosis not present

## 2014-12-04 DIAGNOSIS — J449 Chronic obstructive pulmonary disease, unspecified: Secondary | ICD-10-CM | POA: Diagnosis not present

## 2014-12-04 DIAGNOSIS — G4733 Obstructive sleep apnea (adult) (pediatric): Secondary | ICD-10-CM | POA: Diagnosis not present

## 2014-12-04 DIAGNOSIS — I5032 Chronic diastolic (congestive) heart failure: Secondary | ICD-10-CM | POA: Diagnosis not present

## 2014-12-04 DIAGNOSIS — F329 Major depressive disorder, single episode, unspecified: Secondary | ICD-10-CM | POA: Diagnosis not present

## 2014-12-04 HISTORY — DX: Cardiac murmur, unspecified: R01.1

## 2014-12-04 HISTORY — DX: Cardiac arrhythmia, unspecified: I49.9

## 2014-12-04 HISTORY — DX: Angina pectoris, unspecified: I20.9

## 2014-12-04 HISTORY — DX: Disease of blood and blood-forming organs, unspecified: D75.9

## 2014-12-04 HISTORY — DX: Acute myocardial infarction, unspecified: I21.9

## 2014-12-04 HISTORY — DX: Reserved for inherently not codable concepts without codable children: IMO0001

## 2014-12-04 HISTORY — DX: Heart failure, unspecified: I50.9

## 2014-12-04 HISTORY — DX: Anxiety disorder, unspecified: F41.9

## 2014-12-04 LAB — BASIC METABOLIC PANEL
ANION GAP: 8 (ref 5–15)
BUN: 16 mg/dL (ref 6–23)
CHLORIDE: 100 mmol/L (ref 96–112)
CO2: 33 mmol/L — ABNORMAL HIGH (ref 19–32)
Calcium: 9.3 mg/dL (ref 8.4–10.5)
Creatinine, Ser: 1.05 mg/dL (ref 0.50–1.35)
GFR calc non Af Amer: 75 mL/min — ABNORMAL LOW (ref >90)
GFR, EST AFRICAN AMERICAN: 87 mL/min — AB (ref >90)
Glucose, Bld: 132 mg/dL — ABNORMAL HIGH (ref 70–99)
Potassium: 4.3 mmol/L (ref 3.5–5.1)
Sodium: 141 mmol/L (ref 135–145)

## 2014-12-04 LAB — CBC
HCT: 49.6 % (ref 39.0–52.0)
Hemoglobin: 16.4 g/dL (ref 13.0–17.0)
MCH: 32.6 pg (ref 26.0–34.0)
MCHC: 33.1 g/dL (ref 30.0–36.0)
MCV: 98.6 fL (ref 78.0–100.0)
Platelets: 135 10*3/uL — ABNORMAL LOW (ref 150–400)
RBC: 5.03 MIL/uL (ref 4.22–5.81)
RDW: 13.3 % (ref 11.5–15.5)
WBC: 10.8 10*3/uL — AB (ref 4.0–10.5)

## 2014-12-04 NOTE — Pre-Procedure Instructions (Signed)
ANTONINO NIENHUIS  12/04/2014   Your procedure is scheduled on:  Friday  12/11/14  Report to [facility] Admitting at 530 AM.  Call this number if you have problems the morning of surgery: [phone]   Remember:   Do not eat food or drink liquids after midnight.   Take these medicines the morning of surgery with A SIP OF WATER:  CLONAZEPAM (KLONOPIN), METOPROLOL (TOPROL XL), POTASSIUM, ULTRAM IF NEEDED, BREATHING TREATMENT               (STOP NAPROXEN, STOP ASPIRIN AND PLAVIX AS INSTRUCTED BY PHYSICIAN)   Do not wear jewelry, make-up or nail polish.  Do not wear lotions, powders, or perfumes. You may wear deodorant.  Do not shave 48 hours prior to surgery. Men may shave face and neck.  Do not bring valuables to the hospital.  [facility] is not responsible                  for any belongings or valuables.               Contacts, dentures or bridgework may not be worn into surgery.  Leave suitcase in the car. After surgery it may be brought to your room.  For patients admitted to the hospital, discharge time is determined by your                treatment team.               Patients discharged the day of surgery will not be allowed to drive  home.  Name and phone number of your driver:   Special Instructions: [place] - Preparing for Surgery  Before surgery, you can play an important role.  Because skin is not sterile, your skin needs to be as free of germs as possible.  You can reduce the number of germs on you skin by washing with CHG (chlorahexidine gluconate) soap before surgery.  CHG is an antiseptic cleaner which kills germs and bonds with the skin to continue killing germs even after washing.  Please DO NOT use if you have an allergy to CHG or antibacterial soaps.  If your skin becomes reddened/irritated stop using the CHG and inform your nurse when you arrive at Short Stay.  Do not shave (including legs and underarms) for at least 48 hours prior to the first CHG  shower.  You may shave your face.  Please follow these instructions carefully:   1.  Shower with CHG Soap the night before surgery and the                                morning of Surgery.  2.  If you choose to wash your hair, wash your hair first as usual with your       normal shampoo.  3.  After you shampoo, rinse your hair and body thoroughly to remove the                      Shampoo.  4.  Use CHG as you would any other liquid soap.  You can apply chg directly       to the skin and wash gently with scrungie or a clean washcloth.  5.  Apply the CHG Soap to your body ONLY FROM THE NECK DOWN.        Do not use on open  wounds or open sores.  Avoid contact with your eyes,       ears, mouth and genitals (private parts).  Wash genitals (private parts)       with your normal soap.  6.  Wash thoroughly, paying special attention to the area where your surgery        will be performed.  7.  Thoroughly rinse your body with warm water from the neck down.  8.  DO NOT shower/wash with your normal soap after using and rinsing off       the CHG Soap.  9.  Pat yourself dry with a clean towel.            10.  Wear clean pajamas.            11.  Place clean sheets on your bed the night of your first shower and do not        sleep with pets.  Day of Surgery  Do not apply any lotions/deoderants the morning of surgery.  Please wear clean clothes to the hospital/surgery center.     Please read over the following fact sheets that you were given: Pain Booklet, Coughing and Deep Breathing and Surgical Site Infection Prevention

## 2014-12-04 NOTE — Progress Notes (Signed)
Anesthesia Chart Review:  Patient is a 59 year old male scheduled for excisional biopsy of left supraclavicular neck mass on 12/11/14 by Dr. Wilburn Cornelia.  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 BMI is consistent with morbid obesity.  PCP is Dr. Alain Marion.   Cardiologist is Dr. Johnsie Cancel, who gave permission to hold Plavix 5 days preoperatively. Dr. Johnsie Cancel checked a hypercoagulabililty and Von Willebrand panels on 09/22/11 (all lab results under Results Review tab).  Results were felt okay, and previous bleeding issue after CEA felt likely due to heparin and possibly HIT.   Meds include ASA, Klonopin, Plavix, Toprol XL, Nitro, KCl, Zocor, torsemide, Anoro Ellipta.  07/28/14 EKG: NSR with sinus arrhythmia, possible LAE, incomplete RBBB with negative T wave in V1-V2.  Overall appears stable since 06/18/13.  2/15/13LHC: Distal left main 30%, ostial LAD 90%, LIMA-LAD patent, proximal RI occluded, ostial SVG-RI 95%, proximal RCA occluded, proximal SVG-PDA 95%, EF 55%. The patient underwent PCI with Dr. Burt Knack on the same date: Promus DES to the SVG-RI and Promus DES to the SVG-PDA.  09/15/11 Echo: - Left ventricle: The cavity size was normal. Wall thickness was increased in a pattern of moderate LVH. Systolic function was normal. The estimated ejection fraction was in the range of 55% to 60%. Features are consistent with a pseudonormal left ventricular filling pattern, with concomitant abnormal relaxation and increased filling pressure (grade 2 diastolic dysfunction). - Left atrium: The atrium was moderately to  severely dilated.  11/13/14 Neck CT: IMPRESSION: Left supraclavicular mass lesion shows significant interval growth since 2010. Adjacent left supraclavicular lymph nodes also show mild interval growth. Right supraclavicular lymph nodes 7 x 14 mm. 7 mm right submental lymph node. Favor lymphoma. Metastatic disease considered less likely given the indolent course. Biopsy recommended. Cystic lesion right posterior subcutaneous tissues is stable and most consistent with sebaceous cyst. Tumor involvement considered less likely.  11/08/12 Carotid duplex: Left CEA appears widely patent. 40-59% BICA stenosis. Mild RECA stenosis. Severe LECA stenosis. Severe LSCA stenosis. Antegrade vertebral artery flow.(According to Dr. Kyla Balzarine 07/2014 note, he is planning to recheck at his 6 month visit.)  Preoperative labs noted.   If no acute changes then I anticipate that he can proceed as planned.  George Hugh [facility] Short Stay Center/Anesthesiology Phone [phone] 12/04/2014 5:27 PM

## 2014-12-10 MED ORDER — DEXTROSE 5 % IV SOLN
2.0000 g | INTRAVENOUS | Status: AC
  Administered 2014-12-11: 2 g via INTRAVENOUS
  Filled 2014-12-10 (×2): qty 2

## 2014-12-11 ENCOUNTER — Encounter (HOSPITAL_COMMUNITY)
Admission: RE | Disposition: A | Discharge status: Home / Self Care | Payer: Self-pay | Source: Ambulatory Visit | Attending: Internal Medicine

## 2014-12-11 ENCOUNTER — Ambulatory Visit (HOSPITAL_COMMUNITY): Payer: BLUE CROSS/BLUE SHIELD | Admitting: Vascular Surgery

## 2014-12-11 ENCOUNTER — Encounter (HOSPITAL_COMMUNITY): Payer: Self-pay | Admitting: *Deleted

## 2014-12-11 ENCOUNTER — Ambulatory Visit (HOSPITAL_COMMUNITY): Payer: BLUE CROSS/BLUE SHIELD | Admitting: Anesthesiology

## 2014-12-11 ENCOUNTER — Ambulatory Visit (HOSPITAL_COMMUNITY)
Admission: RE | Admit: 2014-12-11 | Discharge: 2014-12-12 | Disposition: A | Discharge status: Home / Self Care | Payer: BLUE CROSS/BLUE SHIELD | Source: Ambulatory Visit | Attending: Internal Medicine | Admitting: Internal Medicine

## 2014-12-11 DIAGNOSIS — G4733 Obstructive sleep apnea (adult) (pediatric): Secondary | ICD-10-CM | POA: Insufficient documentation

## 2014-12-11 DIAGNOSIS — I252 Old myocardial infarction: Secondary | ICD-10-CM | POA: Insufficient documentation

## 2014-12-11 DIAGNOSIS — G4730 Sleep apnea, unspecified: Secondary | ICD-10-CM | POA: Insufficient documentation

## 2014-12-11 DIAGNOSIS — F17210 Nicotine dependence, cigarettes, uncomplicated: Secondary | ICD-10-CM | POA: Insufficient documentation

## 2014-12-11 DIAGNOSIS — K219 Gastro-esophageal reflux disease without esophagitis: Secondary | ICD-10-CM | POA: Insufficient documentation

## 2014-12-11 DIAGNOSIS — E119 Type 2 diabetes mellitus without complications: Secondary | ICD-10-CM | POA: Insufficient documentation

## 2014-12-11 DIAGNOSIS — Z7982 Long term (current) use of aspirin: Secondary | ICD-10-CM | POA: Insufficient documentation

## 2014-12-11 DIAGNOSIS — F329 Major depressive disorder, single episode, unspecified: Secondary | ICD-10-CM | POA: Insufficient documentation

## 2014-12-11 DIAGNOSIS — C8291 Follicular lymphoma, unspecified, lymph nodes of head, face, and neck: Secondary | ICD-10-CM | POA: Diagnosis not present

## 2014-12-11 DIAGNOSIS — I10 Essential (primary) hypertension: Secondary | ICD-10-CM | POA: Insufficient documentation

## 2014-12-11 DIAGNOSIS — R221 Localized swelling, mass and lump, neck: Secondary | ICD-10-CM | POA: Diagnosis present

## 2014-12-11 DIAGNOSIS — I5032 Chronic diastolic (congestive) heart failure: Secondary | ICD-10-CM | POA: Diagnosis not present

## 2014-12-11 DIAGNOSIS — J9621 Acute and chronic respiratory failure with hypoxia: Secondary | ICD-10-CM | POA: Diagnosis not present

## 2014-12-11 DIAGNOSIS — J449 Chronic obstructive pulmonary disease, unspecified: Secondary | ICD-10-CM | POA: Insufficient documentation

## 2014-12-11 DIAGNOSIS — Z7902 Long term (current) use of antithrombotics/antiplatelets: Secondary | ICD-10-CM | POA: Insufficient documentation

## 2014-12-11 DIAGNOSIS — F32A Depression, unspecified: Secondary | ICD-10-CM | POA: Diagnosis present

## 2014-12-11 DIAGNOSIS — E785 Hyperlipidemia, unspecified: Secondary | ICD-10-CM | POA: Insufficient documentation

## 2014-12-11 DIAGNOSIS — I2510 Atherosclerotic heart disease of native coronary artery without angina pectoris: Secondary | ICD-10-CM | POA: Insufficient documentation

## 2014-12-11 HISTORY — PX: MASS EXCISION: SHX2000

## 2014-12-11 LAB — BLOOD GAS, ARTERIAL
Acid-Base Excess: 4.3 mmol/L — ABNORMAL HIGH (ref 0.0–2.0)
BICARBONATE: 29.9 meq/L — AB (ref 20.0–24.0)
Drawn by: 331001
FIO2: 0.21 %
O2 Saturation: 84.1 %
PO2 ART: 50.8 mmHg — AB (ref 80.0–100.0)
Patient temperature: 98.6
TCO2: 31.7 mmol/L (ref 0–100)
pCO2 arterial: 59.4 mmHg (ref 35.0–45.0)
pH, Arterial: 7.322 — ABNORMAL LOW (ref 7.350–7.450)

## 2014-12-11 LAB — GLUCOSE, CAPILLARY
Glucose-Capillary: 106 mg/dL — ABNORMAL HIGH (ref 70–99)
Glucose-Capillary: 152 mg/dL — ABNORMAL HIGH (ref 70–99)

## 2014-12-11 LAB — BRAIN NATRIURETIC PEPTIDE: B Natriuretic Peptide: 77.1 pg/mL (ref 0.0–100.0)

## 2014-12-11 SURGERY — EXCISION MASS
Anesthesia: General | Site: Neck | Laterality: Left

## 2014-12-11 MED ORDER — ALBUTEROL SULFATE (2.5 MG/3ML) 0.083% IN NEBU
2.5000 mg | INHALATION_SOLUTION | Freq: Four times a day (QID) | RESPIRATORY_TRACT | Status: DC
  Administered 2014-12-12: 2.5 mg via RESPIRATORY_TRACT
  Filled 2014-12-11 (×4): qty 3

## 2014-12-11 MED ORDER — FENTANYL CITRATE (PF) 100 MCG/2ML IJ SOLN: 25.0000 ug | INTRAMUSCULAR | Status: DC | PRN

## 2014-12-11 MED ORDER — METOPROLOL SUCCINATE ER 50 MG PO TB24
50.0000 mg | ORAL_TABLET | Freq: Every day | ORAL | Status: DC
  Administered 2014-12-12: 50 mg via ORAL
  Filled 2014-12-11 (×2): qty 1

## 2014-12-11 MED ORDER — ASPIRIN EC 81 MG PO TBEC
81.0000 mg | DELAYED_RELEASE_TABLET | Freq: Every day | ORAL | Status: DC
  Administered 2014-12-12: 81 mg via ORAL
  Filled 2014-12-11: qty 1

## 2014-12-11 MED ORDER — LIDOCAINE HCL (CARDIAC) 20 MG/ML IV SOLN
INTRAVENOUS | Status: AC
  Filled 2014-12-11: qty 5

## 2014-12-11 MED ORDER — LACTATED RINGERS IV SOLN
INTRAVENOUS | Status: DC | PRN
  Administered 2014-12-11: 07:00:00 via INTRAVENOUS

## 2014-12-11 MED ORDER — TORSEMIDE 20 MG PO TABS
100.0000 mg | ORAL_TABLET | Freq: Every day | ORAL | Status: DC
  Administered 2014-12-12: 100 mg via ORAL
  Filled 2014-12-11: qty 5

## 2014-12-11 MED ORDER — ONDANSETRON HCL 4 MG/2ML IJ SOLN
INTRAMUSCULAR | Status: DC | PRN
  Administered 2014-12-11: 4 mg via INTRAVENOUS

## 2014-12-11 MED ORDER — ACETAMINOPHEN 650 MG RE SUPP: 650.0000 mg | Freq: Four times a day (QID) | RECTAL | Status: DC | PRN

## 2014-12-11 MED ORDER — LIDOCAINE-EPINEPHRINE 1 %-1:100000 IJ SOLN
INTRAMUSCULAR | Status: DC | PRN
  Administered 2014-12-11: 20 mL

## 2014-12-11 MED ORDER — NICOTINE 21 MG/24HR TD PT24
21.0000 mg | MEDICATED_PATCH | Freq: Every day | TRANSDERMAL | Status: DC
  Administered 2014-12-11 – 2014-12-12 (×2): 21 mg via TRANSDERMAL
  Filled 2014-12-11 (×2): qty 1

## 2014-12-11 MED ORDER — FENTANYL CITRATE (PF) 100 MCG/2ML IJ SOLN
INTRAMUSCULAR | Status: DC | PRN
  Administered 2014-12-11: 50 ug via INTRAVENOUS
  Administered 2014-12-11: 100 ug via INTRAVENOUS

## 2014-12-11 MED ORDER — ALBUTEROL SULFATE (2.5 MG/3ML) 0.083% IN NEBU: 2.5000 mg | INHALATION_SOLUTION | RESPIRATORY_TRACT | Status: DC | PRN

## 2014-12-11 MED ORDER — PROPOFOL 10 MG/ML IV BOLUS
INTRAVENOUS | Status: AC
  Filled 2014-12-11: qty 20

## 2014-12-11 MED ORDER — ARTIFICIAL TEARS OP OINT
TOPICAL_OINTMENT | OPHTHALMIC | Status: AC
  Filled 2014-12-11: qty 3.5

## 2014-12-11 MED ORDER — TORSEMIDE 100 MG PO TABS: 50.0000 mg | ORAL_TABLET | Freq: Every day | ORAL | Status: DC

## 2014-12-11 MED ORDER — IPRATROPIUM BROMIDE 0.02 % IN SOLN
0.2500 mg | Freq: Once | RESPIRATORY_TRACT | Status: AC
  Administered 2014-12-11: 0.25 mg via RESPIRATORY_TRACT
  Filled 2014-12-11 (×2): qty 2.5

## 2014-12-11 MED ORDER — DEXAMETHASONE SODIUM PHOSPHATE 10 MG/ML IJ SOLN
10.0000 mg | Freq: Once | INTRAMUSCULAR | Status: AC
  Administered 2014-12-11: 10 mg via INTRAVENOUS

## 2014-12-11 MED ORDER — DEXAMETHASONE SODIUM PHOSPHATE 10 MG/ML IJ SOLN
INTRAMUSCULAR | Status: AC
  Filled 2014-12-11: qty 1

## 2014-12-11 MED ORDER — SODIUM CHLORIDE 0.9 % IJ SOLN
INTRAMUSCULAR | Status: AC
Start: 2014-12-11 — End: 2014-12-11
  Filled 2014-12-11: qty 10

## 2014-12-11 MED ORDER — OXYCODONE HCL 5 MG PO TABS: 5.0000 mg | ORAL_TABLET | Freq: Once | ORAL | Status: DC | PRN

## 2014-12-11 MED ORDER — ALBUTEROL SULFATE (2.5 MG/3ML) 0.083% IN NEBU
INHALATION_SOLUTION | RESPIRATORY_TRACT | Status: AC
  Administered 2014-12-11: 2.5 mg
  Filled 2014-12-11: qty 3

## 2014-12-11 MED ORDER — FENTANYL CITRATE (PF) 250 MCG/5ML IJ SOLN
INTRAMUSCULAR | Status: AC
  Filled 2014-12-11: qty 5

## 2014-12-11 MED ORDER — SIMVASTATIN 20 MG PO TABS
20.0000 mg | ORAL_TABLET | Freq: Every day | ORAL | Status: DC
  Administered 2014-12-11: 20 mg via ORAL
  Filled 2014-12-11: qty 1

## 2014-12-11 MED ORDER — SUCCINYLCHOLINE CHLORIDE 20 MG/ML IJ SOLN
INTRAMUSCULAR | Status: AC
  Filled 2014-12-11: qty 1

## 2014-12-11 MED ORDER — SUCCINYLCHOLINE CHLORIDE 20 MG/ML IJ SOLN
INTRAMUSCULAR | Status: DC | PRN
  Administered 2014-12-11: 60 mg via INTRAVENOUS

## 2014-12-11 MED ORDER — CLOPIDOGREL BISULFATE 75 MG PO TABS
75.0000 mg | ORAL_TABLET | Freq: Every day | ORAL | Status: DC
  Administered 2014-12-12: 75 mg via ORAL
  Filled 2014-12-11: qty 1

## 2014-12-11 MED ORDER — OXYCODONE HCL 5 MG/5ML PO SOLN: 5.0000 mg | Freq: Once | ORAL | Status: DC | PRN

## 2014-12-11 MED ORDER — GLYCOPYRROLATE 0.2 MG/ML IJ SOLN
INTRAMUSCULAR | Status: AC
  Filled 2014-12-11: qty 1

## 2014-12-11 MED ORDER — PROPOFOL 10 MG/ML IV BOLUS
INTRAVENOUS | Status: DC | PRN
  Administered 2014-12-11: 160 mg via INTRAVENOUS
  Administered 2014-12-11: 40 mg via INTRAVENOUS

## 2014-12-11 MED ORDER — LIDOCAINE-EPINEPHRINE 1 %-1:100000 IJ SOLN
INTRAMUSCULAR | Status: AC
Start: 2014-12-11 — End: 2014-12-11
  Filled 2014-12-11: qty 1

## 2014-12-11 MED ORDER — PROPOFOL 10 MG/ML IV BOLUS
INTRAVENOUS | Status: AC
Start: 2014-12-11 — End: 2014-12-11
  Filled 2014-12-11: qty 20

## 2014-12-11 MED ORDER — LIDOCAINE HCL (CARDIAC) 20 MG/ML IV SOLN
INTRAVENOUS | Status: DC | PRN
  Administered 2014-12-11: 100 mg via INTRAVENOUS

## 2014-12-11 MED ORDER — CLONAZEPAM 0.5 MG PO TABS: 0.5000 mg | ORAL_TABLET | Freq: Two times a day (BID) | ORAL | Status: DC | PRN

## 2014-12-11 MED ORDER — TIOTROPIUM BROMIDE MONOHYDRATE 18 MCG IN CAPS
18.0000 ug | ORAL_CAPSULE | Freq: Every day | RESPIRATORY_TRACT | Status: DC
  Filled 2014-12-11: qty 5

## 2014-12-11 MED ORDER — ONDANSETRON HCL 4 MG/2ML IJ SOLN: 4.0000 mg | Freq: Four times a day (QID) | INTRAMUSCULAR | Status: DC | PRN

## 2014-12-11 MED ORDER — ARTIFICIAL TEARS OP OINT
TOPICAL_OINTMENT | OPHTHALMIC | Status: DC | PRN
  Administered 2014-12-11: 1 via OPHTHALMIC

## 2014-12-11 MED ORDER — ACETAMINOPHEN 325 MG PO TABS
650.0000 mg | ORAL_TABLET | Freq: Four times a day (QID) | ORAL | Status: DC | PRN
  Administered 2014-12-12: 650 mg via ORAL
  Filled 2014-12-11: qty 2

## 2014-12-11 MED ORDER — EPHEDRINE SULFATE 50 MG/ML IJ SOLN
INTRAMUSCULAR | Status: AC
  Filled 2014-12-11: qty 1

## 2014-12-11 MED ORDER — ONDANSETRON HCL 4 MG PO TABS: 4.0000 mg | ORAL_TABLET | Freq: Four times a day (QID) | ORAL | Status: DC | PRN

## 2014-12-11 MED ORDER — ONDANSETRON HCL 4 MG/2ML IJ SOLN
INTRAMUSCULAR | Status: AC
  Filled 2014-12-11: qty 2

## 2014-12-11 MED ORDER — MIDAZOLAM HCL 2 MG/2ML IJ SOLN
INTRAMUSCULAR | Status: AC
Start: 2014-12-11 — End: 2014-12-11
  Filled 2014-12-11: qty 2

## 2014-12-11 SURGICAL SUPPLY — 32 items
BLADE SURG 15 STRL LF DISP TIS (BLADE) ×1 IMPLANT
BLADE SURG 15 STRL SS (BLADE) ×2
CLEANER TIP ELECTROSURG 2X2 (MISCELLANEOUS) ×1 IMPLANT
CONT SPEC STER OR (MISCELLANEOUS) ×1 IMPLANT
COVER SURGICAL LIGHT HANDLE (MISCELLANEOUS) ×1 IMPLANT
DRAPE PROXIMA HALF (DRAPES) ×1 IMPLANT
ELECT COATED BLADE 2.86 ST (ELECTRODE) ×2 IMPLANT
ELECT REM PT RETURN 9FT ADLT (ELECTROSURGICAL) ×2
ELECTRODE REM PT RTRN 9FT ADLT (ELECTROSURGICAL) IMPLANT
GAUZE SPONGE 4X4 16PLY XRAY LF (GAUZE/BANDAGES/DRESSINGS) ×2 IMPLANT
GLOVE BIOGEL M 7.0 STRL (GLOVE) ×2 IMPLANT
GLOVE BIOGEL PI IND STRL 7.0 (GLOVE) IMPLANT
GLOVE BIOGEL PI IND STRL 7.5 (GLOVE) IMPLANT
GLOVE BIOGEL PI INDICATOR 7.0 (GLOVE) ×2
GLOVE BIOGEL PI INDICATOR 7.5 (GLOVE) ×1
GLOVE SURG SS PI 7.0 STRL IVOR (GLOVE) ×1 IMPLANT
GOWN STRL REUS W/ TWL LRG LVL3 (GOWN DISPOSABLE) ×2 IMPLANT
GOWN STRL REUS W/TWL LRG LVL3 (GOWN DISPOSABLE) ×4
KIT BASIN OR (CUSTOM PROCEDURE TRAY) ×2 IMPLANT
KIT ROOM TURNOVER OR (KITS) ×2 IMPLANT
LIQUID BAND (GAUZE/BANDAGES/DRESSINGS) ×2 IMPLANT
NDL HYPO 25GX1X1/2 BEV (NEEDLE) ×1 IMPLANT
NEEDLE HYPO 25GX1X1/2 BEV (NEEDLE) ×2 IMPLANT
NS IRRIG 1000ML POUR BTL (IV SOLUTION) ×2 IMPLANT
PACK SURGICAL SETUP 50X90 (CUSTOM PROCEDURE TRAY) ×2 IMPLANT
PAD ARMBOARD 7.5X6 YLW CONV (MISCELLANEOUS) ×4 IMPLANT
PENCIL BUTTON HOLSTER BLD 10FT (ELECTRODE) ×1 IMPLANT
SUT MNCRL AB 4-0 PS2 18 (SUTURE) ×1 IMPLANT
SUT VICRYL 4-0 PS2 18IN ABS (SUTURE) ×1 IMPLANT
SYR BULB 3OZ (MISCELLANEOUS) ×2 IMPLANT
SYR CONTROL 10ML LL (SYRINGE) ×2 IMPLANT
TRAY ENT MC OR (CUSTOM PROCEDURE TRAY) ×2 IMPLANT

## 2014-12-11 NOTE — Progress Notes (Signed)
Dr Wilburn Cornelia aware of pt's o2 sats in recovery room.  He will consult hospitalists to admit pt.

## 2014-12-11 NOTE — Progress Notes (Signed)
Pt up in recliner. On 1.5L [place]-O2 sats86-94%. Using IS up to 1.5 to 2L.  He wants to go home. Atrovent neb. given per Dr Conrad Harmony order. Will cont to monitor.

## 2014-12-11 NOTE — H&P (Signed)
[name] is an 59 y.o. male.   Chief Complaint: Left neck mass HPI: Hx of enlarging left supraclavicular neck mass  Past Medical History  Diagnosis Date  . Heparin induced thrombocytopenia   . Hypertension   . Hyperlipidemia   . Obesity   . Edema   . COPD (chronic obstructive pulmonary disease)   . GERD (gastroesophageal reflux disease)   . Basal cell carcinoma of nose   . Chronic lower back pain   . Depression   . Myocardial infarction   . Heart murmur   . Dysrhythmia     fluttering  . Anginal pain     not had to use in awhile  none in 10 years  . CHF (congestive heart failure)   . Shortness of breath dyspnea     with exertion   . Diabetes mellitus without complication     borderline   . Anxiety   . Blood dyscrasia     trouble clotting   . Coronary artery disease     CABG 2005. s/p PTCA and stenting of the saphenous vein graft to PDA and saphenous vein graft to obtuse marginal by Dr. Burt Knack 09/29/11. Normal EF at cath 09/2011  . Sleep apnea     "used to"      could not use 2006  . Carotid artery disease     s/p L CEA 2009 (hx of evacuation of hematoma due to heparin)    Past Surgical History  Procedure Laterality Date  . Evacuation of hematoma  03/2008    left neck; S/P endarterectomy; "cause heparin clotted it up"  . Coronary angioplasty with stent placement  09/29/11    "2"  . Carotid endarterectomy  03/2008    left  . Coronary artery bypass graft  2005    CABG X3  . Basal cell carcinoma excision  2000's    nose  . Percutaneous coronary stent intervention (pci-s) N/A 09/29/2011    Procedure: PERCUTANEOUS CORONARY STENT INTERVENTION (PCI-S);  Surgeon: Sherren Mocha, MD;  Location: [facility] CATH LAB;  Service: Cardiovascular;  Laterality: N/A;    Family History  Problem Relation Age of Onset  . Heart disease Father   . Bone cancer Mother    Social History:  reports that he has been smoking Cigarettes.  He has a 82 pack-year smoking history. He has quit using  smokeless tobacco. His smokeless tobacco use included Chew. He reports that he drinks alcohol. He reports that he uses illicit drugs (Marijuana).  Allergies:  Allergies  Allergen Reactions  . Heparin Other (See Comments)    Opposite reaction    Medications Prior to Admission  Medication Sig Dispense Refill  . aspirin EC 81 MG tablet Take 81 mg by mouth daily.    . clonazePAM (KLONOPIN) 0.5 MG tablet TAKE 1 TABLET BY MOUTH TWICE A DAY AS NEEDED FOR ANXIETY or at hs for insomnia 180 tablet 0  . clopidogrel (PLAVIX) 75 MG tablet TAKE 1 TABLET BY MOUTH DAILY WITH BREAKFAST. 90 tablet 3  . metoprolol succinate (TOPROL-XL) 50 MG 24 hr tablet TAKE 2 TABLETS EVERY DAY 180 tablet 3  . nitroGLYCERIN (NITROSTAT) 0.4 MG SL tablet Place 1 tablet (0.4 mg total) under the tongue every 5 (five) minutes as needed for chest pain (Up to 3 doses). 25 tablet 4  . potassium chloride SA (KLOR-CON M20) 20 MEQ tablet TAKE 1 TABLET BY MOUTH DAILY 90 tablet 3  . simvastatin (ZOCOR) 20 MG tablet TAKE 1 TABLET (20  MG TOTAL) BY MOUTH AT BEDTIME. 90 tablet 3  . torsemide (DEMADEX) 100 MG tablet Take 0.5-1 tablets (50-100 mg total) by mouth daily. 90 tablet 3  . traMADol (ULTRAM) 50 MG tablet TAKE 1 TO 2 TABLETS BY MOUTH TWICE A DAY AS NEEDED FOR PAIN 360 tablet 0  . Umeclidinium-Vilanterol (ANORO ELLIPTA) 62.5-25 MCG/INH AEPB INHALE 1 ACT INTO THE LUNGS DAILY 180 each 3  . naproxen sodium (ANAPROX) 220 MG tablet Take 1 tablet (220 mg total) by mouth as needed. 60 tablet 2    No results found for this or any previous visit (from the past 48 hour(s)). No results found.  Review of Systems  Constitutional: Negative.   HENT: Negative.   Respiratory: Negative.   Cardiovascular: Negative.     Blood pressure 147/71, pulse 66, temperature 98.2 F (36.8 C), temperature source Oral, resp. rate 20, weight 155.584 kg (343 lb), SpO2 94 %. Physical Exam  Constitutional: He appears well-developed.  Neck: Normal range of  motion.  Lymphadenopathy:    He has cervical adenopathy.     Assessment/Plan Adm for OP Incision biopsy left neck.  Whitney, Altheia Shafran 12/11/2014, 7:27 AM

## 2014-12-11 NOTE — Anesthesia Postprocedure Evaluation (Signed)
  Anesthesia Post-op Note  Patient: [name]  Procedure(s) Performed: Procedure(s): EXCISIONAL BIOPSY OF LEFT SUPRA CLAVICULAR NECK MASS (Left)  Patient Location: PACU  Anesthesia Type:General  Level of Consciousness: awake, alert  and oriented  Airway and Oxygen Therapy: Patient Spontanous Breathing and Patient connected to nasal cannula oxygen  Post-op Pain: mild  Post-op Assessment: Post-op Vital signs reviewed, Patient's Cardiovascular Status Stable, RESPIRATORY FUNCTION UNSTABLE, Patent Airway, No signs of Nausea or vomiting and Pain level controlled  Post-op Vital Signs: Reviewed and stable  Last Vitals:  Filed Vitals:   12/11/14 1700  BP: 163/67  Pulse: 69  Temp:   Resp:     Complications: No apparent anesthesia complications. Patient with multiple comorbidities and likely undiagnosed baseline hypoxemia. Given persistent hypoxia in PACU despite intervention will have patient evaluated for home oxygen. Briefly discussed importance of smoking cessation with patient.

## 2014-12-11 NOTE — Progress Notes (Signed)
May give albuterol treatment- per Dr Ermalene Postin

## 2014-12-11 NOTE — Progress Notes (Signed)
Patient ID: [name], male   DOB: 1955-10-26, 59 y.o.   MRN: [id number] Patient without complaints.  Frustrated to be in hospital. AF VSS  O2 sat 84% on room air Alert, NAD Left neck with sizeable mass.  Overlying incision clean and intact. A/P: Left neck mass s/p open biopsy, post-operative oxygen desaturation Surgical site looks good.  No wound care needed.  Oxygen desaturation management per hospitalist service, appreciate their input and care.  Call with any questions.

## 2014-12-11 NOTE — Transfer of Care (Signed)
Immediate Anesthesia Transfer of Care Note  Patient: [name]  Procedure(s) Performed: Procedure(s): EXCISIONAL BIOPSY OF LEFT SUPRA CLAVICULAR NECK MASS (Left)  Patient Location: PACU  Anesthesia Type:General  Level of Consciousness: awake, alert , oriented and sedated  Airway & Oxygen Therapy: Patient Spontanous Breathing and Patient connected to nasal cannula oxygen/mask  Post-op Assessment: Report given to RN, Post -op Vital signs reviewed and stable and Patient moving all extremities  Post vital signs: Reviewed and stable  Last Vitals:  Filed Vitals:   12/11/14 0907  BP: 185/75  Pulse: 75  Temp:   Resp: 23    Complications: No apparent anesthesia complications

## 2014-12-11 NOTE — Progress Notes (Signed)
Dr Ermalene Postin at bedside - aware of o2 sats.   Order received to give atrovent neb.  Will cont to monitor

## 2014-12-11 NOTE — Anesthesia Procedure Notes (Signed)
Procedure Name: Intubation Date/Time: 12/11/2014 7:42 AM Performed by: Scheryl Darter Pre-anesthesia Checklist: Patient identified, Emergency Drugs available, Suction available, Patient being monitored and Timeout performed Patient Re-evaluated:Patient Re-evaluated prior to inductionOxygen Delivery Method: Circle system utilized Preoxygenation: Pre-oxygenation with 100% oxygen Intubation Type: IV induction Ventilation: Mask ventilation without difficulty Laryngoscope Size: Glidescope Grade View: Grade II Tube type: Oral Tube size: 8.0 mm Number of attempts: 1 Airway Equipment and Method: Stylet Placement Confirmation: ETT inserted through vocal cords under direct vision,  positive ETCO2 and breath sounds checked- equal and bilateral Secured at: 23 cm Tube secured with: Tape Dental Injury: Teeth and Oropharynx as per pre-operative assessment

## 2014-12-11 NOTE — Progress Notes (Signed)
Care management states pt will need to be admitted in order for pt to qualify for home o2.  Dr Wilburn Cornelia paged.

## 2014-12-11 NOTE — Progress Notes (Signed)
Dr Ermalene Postin at bedside to assess O2 sats.  States pt will need home 02 in order to be discharged.  Care management called.

## 2014-12-11 NOTE — Anesthesia Preprocedure Evaluation (Addendum)
Anesthesia Evaluation  Patient identified by MRN, date of birth, ID band Patient awake    Reviewed: Allergy & Precautions, NPO status , Patient's Chart, lab work & pertinent test results  History of Anesthesia Complications Negative for: history of anesthetic complications  Airway Mallampati: IV  TM Distance: >3 FB Neck ROM: Full    Dental  (+) Poor Dentition, Missing,    Pulmonary shortness of breath, sleep apnea , COPD COPD inhaler, Current Smoker,  breath sounds clear to auscultation        Cardiovascular hypertension, Pt. on medications and Pt. on home beta blockers + CAD, + Past MI, + Peripheral Vascular Disease and +CHF + Valvular Problems/Murmurs Rhythm:Regular + Systolic murmurs    Neuro/Psych PSYCHIATRIC DISORDERS Anxiety Depression negative neurological ROS     GI/Hepatic GERD-  Controlled,  Endo/Other  diabetesMorbid obesity  Renal/GU      Musculoskeletal   Abdominal   Peds  Hematology   Anesthesia Other Findings   Reproductive/Obstetrics                            Anesthesia Physical Anesthesia Plan  ASA: III  Anesthesia Plan: General   Post-op Pain Management:    Induction: Intravenous  Airway Management Planned: Oral ETT  Additional Equipment: None  Intra-op Plan:   Post-operative Plan: Extubation in OR  Informed Consent: I have reviewed the patients History and Physical, chart, labs and discussed the procedure including the risks, benefits and alternatives for the proposed anesthesia with the patient or authorized representative who has indicated his/her understanding and acceptance.   Dental advisory given  Plan Discussed with: CRNA and Surgeon  Anesthesia Plan Comments:         Anesthesia Quick Evaluation

## 2014-12-11 NOTE — Progress Notes (Signed)
Hospitalist MD at bedside. 

## 2014-12-11 NOTE — H&P (Signed)
History and Physical  [name] WPY:[id number] DOB: 03-Dec-1955 DOA: 12/11/2014  Referring physician: Dr. Wilburn Cornelia, ENT PCP: Walker Kehr, MD   Chief Complaint: Respiratory failure  HPI: [name] is a 59 y.o. male  Past medical history of obstructive sleep apnea refusing C Pap, COPD with ongoing tobacco abuse, CAD and diastolic CHF has had a chronic lump on the left side of his neck that lately had been getting bigger, so he underwent a biopsy by ENT today. Preop screening labs a few days ago were unremarkable. Patient is normally not on oxygen and reportedly, his oxygen saturation saturations were around 92% on room air before hand. After biopsy, patient's oxygen saturation remained in the 80s even after several hours. Hospitalists were called for further evaluation.   Review of Systems:  Patient seen in PACU Pt with no complaints. He says his breathing is always this rough. Says he has a chronic cough with whitish sputum all the time. Denies any chest pain. Is not give more than one or 2 verbal answers, but otherwise says that nothing is bothering him.  Past Medical History  Diagnosis Date  . Heparin induced thrombocytopenia   . Hypertension   . Hyperlipidemia   . Obesity   . Edema   . COPD (chronic obstructive pulmonary disease)   . GERD (gastroesophageal reflux disease)   . Basal cell carcinoma of nose   . Chronic lower back pain   . Depression   . Myocardial infarction   . Heart murmur   . Dysrhythmia     fluttering  . Anginal pain     not had to use in awhile  none in 10 years  . CHF (congestive heart failure)   . Shortness of breath dyspnea     with exertion   . Diabetes mellitus without complication     borderline   . Anxiety   . Blood dyscrasia     trouble clotting   . Coronary artery disease     CABG 2005. s/p PTCA and stenting of the saphenous vein graft to PDA and saphenous vein graft to obtuse marginal by Dr. Burt Knack 09/29/11. Normal EF at cath  09/2011  . Sleep apnea     "used to"      could not use 2006  . Carotid artery disease     s/p L CEA 2009 (hx of evacuation of hematoma due to heparin)   Past Surgical History  Procedure Laterality Date  . Evacuation of hematoma  03/2008    left neck; S/P endarterectomy; "cause heparin clotted it up"  . Coronary angioplasty with stent placement  09/29/11    "2"  . Carotid endarterectomy  03/2008    left  . Coronary artery bypass graft  2005    CABG X3  . Basal cell carcinoma excision  2000's    nose  . Percutaneous coronary stent intervention (pci-s) N/A 09/29/2011    Procedure: PERCUTANEOUS CORONARY STENT INTERVENTION (PCI-S);  Surgeon: Sherren Mocha, MD;  Location[facility] CATH LAB;  Service: Cardiovascular;  Laterality: N/A;   Social History:  reports that he has been smoking Cigarettes.  He has a 82 pack-year smoking history. He has quit using smokeless tobacco. His smokeless tobacco use included Chew. He reports that he drinks alcohol. He reports that he uses illicit drugs (Marijuana). Patient lives at home with his wife & is able to participate in activities of daily living with out assistance, however he gets very easily winded, even with very minimal  activity such as going to the bathroom  Allergies  Allergen Reactions  . Heparin Other (See Comments)    Opposite reaction    Family History  Problem Relation Age of Onset  . Heart disease Father   . Bone cancer Mother      Prior to Admission medications   Medication Sig Start Date End Date Taking? Authorizing Provider  aspirin EC 81 MG tablet Take 81 mg by mouth daily.   Yes Historical Provider, MD  clonazePAM (KLONOPIN) 0.5 MG tablet TAKE 1 TABLET BY MOUTH TWICE A DAY AS NEEDED FOR ANXIETY or at hs for insomnia 08/12/14  Yes Aleksei Plotnikov V, MD  clopidogrel (PLAVIX) 75 MG tablet TAKE 1 TABLET BY MOUTH DAILY WITH BREAKFAST. 08/12/14  Yes Aleksei Plotnikov V, MD  metoprolol succinate (TOPROL-XL) 50 MG 24 hr tablet TAKE 2  TABLETS EVERY DAY 08/12/14  Yes Aleksei Plotnikov V, MD  nitroGLYCERIN (NITROSTAT) 0.4 MG SL tablet Place 1 tablet (0.4 mg total) under the tongue every 5 (five) minutes as needed for chest pain (Up to 3 doses). 01/06/14 09/25/15 Yes [name], MD  potassium chloride SA (KLOR-CON M20) 20 MEQ tablet TAKE 1 TABLET BY MOUTH DAILY 08/12/14  Yes Aleksei Plotnikov V, MD  simvastatin (ZOCOR) 20 MG tablet TAKE 1 TABLET (20 MG TOTAL) BY MOUTH AT BEDTIME. 09/15/14  Yes Aleksei Plotnikov V, MD  torsemide (DEMADEX) 100 MG tablet Take 0.5-1 tablets (50-100 mg total) by mouth daily. 08/12/14  Yes Aleksei Plotnikov V, MD  traMADol (ULTRAM) 50 MG tablet TAKE 1 TO 2 TABLETS BY MOUTH TWICE A DAY AS NEEDED FOR PAIN 08/12/14  Yes Aleksei Plotnikov V, MD  Umeclidinium-Vilanterol (ANORO ELLIPTA) 62.5-25 MCG/INH AEPB INHALE 1 ACT INTO THE LUNGS DAILY 08/12/14  Yes Aleksei Plotnikov V, MD  naproxen sodium (ANAPROX) 220 MG tablet Take 1 tablet (220 mg total) by mouth as needed. 08/19/14   [name], MD    Physical Exam: BP 142/69 mmHg  Pulse 67  Temp(Src) 98.1 F (36.7 C) (Oral)  Resp 17  Wt 155.584 kg (343 lb)  SpO2 84%  General:  Grumpy, alert and oriented 3 Eyes: Sclera nonicteric, extraocular movements are intact ENT: No cephalic and atraumatic, mucous numbers are moist Neck: Thick neck Cardiovascular: Regular rate and rhythm, S1-S2 Respiratory: Decreased breath sounds throughout Abdomen: Soft, obese, nontender, positive bowel sounds Skin: No skin breaks, tears or lesions Musculoskeletal: No clubbing or cyanosis, trace pitting edema bilaterally Psychiatric: Patient is appropriate, no evidence of psychoses Neurologic: No focal deficits           Labs on Admission:  Basic Metabolic Panel: No results for input(s): NA, K, CL, CO2, GLUCOSE, BUN, CREATININE, CALCIUM, MG, PHOS in the last 168 hours. Liver Function Tests: No results for input(s): AST, ALT, ALKPHOS, BILITOT, PROT, ALBUMIN in the  last 168 hours. No results for input(s): LIPASE, AMYLASE in the last 168 hours. No results for input(s): AMMONIA in the last 168 hours. CBC: No results for input(s): WBC, NEUTROABS, HGB, HCT, MCV, PLT in the last 168 hours. Cardiac Enzymes: No results for input(s): CKTOTAL, CKMB, CKMBINDEX, TROPONINI in the last 168 hours.  BNP (last 3 results) No results for input(s): BNP in the last 8760 hours.  ProBNP (last 3 results) No results for input(s): PROBNP in the last 8760 hours.  CBG:  Recent Labs Lab 12/11/14 0557 12/11/14 0909  GLUCAP 106* 152*    Radiological Exams on Admission: No results found.   Assessment/Plan Present on  Admission:  . Mass of neck: Status post biopsy of neck. Pathology pending  . Morbid obesity: Patient is criteria with BMI greater than 40  . Acute on chronic respiratory failure with hypoxia/COPD: Patient continues to smoke and has not been to his pulmonologist in years. Given what he describes as his baseline of getting easily winded even with mild activity, I suspect that he likely has been eating chronic oxygen for some time. That said, prior to today's surgery, patient's oxygen saturations were 92% on room air. Residual anesthesia likely lead to some mild hypercarbia and now respiratory failure. We'll treat with nebulizers and supple and oxygen. By tomorrow, will ablate patient and if still with oxygen desaturation, will likely go home on 2 L nasal cannula.  . Chronic diastolic heart failure: Checking BNP, following daily weights  . Depression . OSA (obstructive sleep apnea): Patient refuses nightly C Pap  . Hypertension: Continue home meds  . Coronary artery disease: Looks to be stable. Spoke with ENT who said it is okay to resume aspirin and Plavix  Consultants: ENT  Code Status: Full code  Family Communication: Wife and daughter the bedside  Disposition Plan: Possibly home tomorrow  Time spent: 65 minutes  [facility] Pager [phone]

## 2014-12-11 NOTE — Progress Notes (Signed)
Received patient from PACU pust neck biopsy. Alert and oriented, irritable because he had to stay overnight. Emotional support given. Oriented to unit and staff. Call light given. VSS, O2 at 3LPM, Will continue to monitor.

## 2014-12-11 NOTE — Op Note (Signed)
NAME:  [name], [name] NO.:  [id number]  MEDICAL RECORD NO.:  [id number]  LOCATION:  [facility]                         FACILITY:  [facility]  PHYSICIAN:  Early Chars. Wilburn Cornelia, M.D.DATE OF BIRTH:  01-Mar-1956  DATE OF PROCEDURE:  12/11/2014 DATE OF DISCHARGE:                              OPERATIVE REPORT   LOCATION:  [facility] Main OR.  PREOPERATIVE DIAGNOSIS:  Left supraclavicular neck mass.  POSTOPERATIVE DIAGNOSIS:  Left supraclavicular neck mass.  INDICATION FOR SURGERY:  Left supraclavicular neck mass.  SURGICAL PROCEDURE:  Incisional biopsy, left supraclavicular neck mass.  SURGEON:  Early Chars. Wilburn Cornelia, M.D.  ANESTHESIA:  General endotracheal.  COMPLICATIONS:  None.  ESTIMATED BLOOD LOSS:  Minimal.  DISPOSITION:  The patient was transferred from the operating room to the recovery room in stable condition.  BRIEF HISTORY:  The patient is a 59 year old white male with significant medical comorbidities including extensive cardiovascular history and bleeding abnormalities.  The patient is obese with obstructive sleep apnea.  He was originally seen approximately 3 years ago with a mass in the left supraclavicular region, was recommended a biopsy at that time. The patient was asymptomatic and deferred any further workup or treatment at that time.  He returned recently at the request of his cardiologist Dr. Johnsie Cancel for evaluation of an enlarging mass in the left supraclavicular fossa.  The patient underwent a needle biopsy which showed findings consistent with possible lymphoma.  Scanning of the neck showed a large approximately 5 cm solid mass in the left supraclavicular fossa.  There was no other significant abnormal lymphadenopathy or thyromegaly.  Given the patient's history and findings, I recommended additional biopsy for accurate diagnosis of the supraclavicular mass. The risks and benefits of the procedure were discussed in detail. Surgery was  scheduled at [facility] under general anesthesia as an outpatient.  Prior to surgery, the patient had cardiac clearance from Dr. Johnsie Cancel, and the surgery was scheduled on elective basis on December 11, 2014.  DESCRIPTION OF PROCEDURE:  The patient was brought to the operating room at [facility], placed in a supine position on the operating table.  General endotracheal anesthesia was established without difficulty.  When the patient was adequately anesthetized, he was positioned and prepped and draped.  His neck was injected with 1 mL of 1% lidocaine 1:100,000 solution of epinephrine which was injected in a subcutaneous fashion overlying the left supraclavicular neck mass. After allowing adequate time for vasoconstriction and hemostasis, the patient was prepped, draped, and prepared for surgery.  A time out was performed.  With the patient prepared for surgery, he was prepped and draped.  A 2 cm horizontally oriented incision was created in a preexisting skin crease.  This was carried through the skin, underlying subcutaneous tissue.  The soft tissue was carefully dissected with blunt and sharp dissection using Bovie electrocautery.  The platysma muscle was identified and divided and a small subplatysmal flaps were elevated.  A large palpable mass deep to the incision was carefully dissected and an approximately 1 x 1 cm portion of the mass was resected and sent to Pathology for lymphoma workup.  Bovie electrocautery was  used for dissection.  There was minimal bleeding.  The patient's wound was copiously irrigated and again no active bleeding was noted.  The incision was closed in layers consisting of 4-0 Vicryl suture to reapproximate the platysmal layer.  Several interrupted deep subcutaneous 4-0 Vicryl sutures were then placed and the final skin edge was closed with Dermabond surgical glue.  The patient was awakened from his anesthetic, extubated, and  transferred from the operating room to the recovery room in stable condition.  There were no complications, no blood loss.          ______________________________ Early Chars Wilburn Cornelia, M.D.     DLS/MEDQ  D:  56/86/1683  T:  12/11/2014  Job:  [id number]  cc:   Wallis Bamberg. Johnsie Cancel, MD, [facility]

## 2014-12-11 NOTE — Brief Op Note (Signed)
12/11/2014  8:29 AM  PATIENT:  [name]  59 y.o. male  PRE-OPERATIVE DIAGNOSIS:  SWELLING OF MASS OF LEFT NECK   POST-OPERATIVE DIAGNOSIS:  SWELLING OF MASS OF LEFT NECK   PROCEDURE:  Procedure(s): EXCISIONAL BIOPSY OF LEFT SUPRA CLAVICULAR NECK MASS (Left)  SURGEON:  Surgeon(s) and Role:    * Jerrell Belfast, MD - Primary  PHYSICIAN ASSISTANT:   ASSISTANTS: none   ANESTHESIA:   general  EBL:   Min  BLOOD ADMINISTERED:none  DRAINS: none   LOCAL MEDICATIONS USED:  LIDOCAINE  and Amount: 1 ml  SPECIMEN:  Source of Specimen:  Left neck mass  DISPOSITION OF SPECIMEN:  PATHOLOGY  COUNTS:  YES  TOURNIQUET:  * No tourniquets in log *  DICTATION: .Other Dictation: Dictation Number [id number]  PLAN OF CARE: Discharge to home after PACU  PATIENT DISPOSITION:  PACU - hemodynamically stable.   Delay start of Pharmacological VTE agent (>24hrs) due to surgical blood loss or risk of bleeding: no

## 2014-12-11 NOTE — Progress Notes (Signed)
Pt has swelling noted at incision site present upon admission to hospital - per CRNA

## 2014-12-12 DIAGNOSIS — J9621 Acute and chronic respiratory failure with hypoxia: Secondary | ICD-10-CM | POA: Diagnosis not present

## 2014-12-12 DIAGNOSIS — C8291 Follicular lymphoma, unspecified, lymph nodes of head, face, and neck: Secondary | ICD-10-CM | POA: Diagnosis not present

## 2014-12-12 MED ORDER — TIOTROPIUM BROMIDE MONOHYDRATE 18 MCG IN CAPS: 18.0000 ug | ORAL_CAPSULE | Freq: Every day | RESPIRATORY_TRACT | Status: DC

## 2014-12-12 MED ORDER — ALBUTEROL SULFATE (2.5 MG/3ML) 0.083% IN NEBU: 2.5000 mg | INHALATION_SOLUTION | RESPIRATORY_TRACT | Status: DC | PRN

## 2014-12-12 NOTE — Discharge Planning (Signed)
Copy of AVS to pt who verbalizes understanding. O2 delivered to room but won't put it on for discharge. Has 2 rx and all personal belongings. d'cd amb, refused w/c, to private car homw, acop. By wife.

## 2014-12-12 NOTE — Care Management Note (Addendum)
    Page 1 of 1   12/12/2014     4:03:42 PM CARE MANAGEMENT NOTE 12/12/2014  Patient:  [name], [name]   Account Number:  [id number]  Date Initiated:  12/12/2014  Documentation initiated by:  [facility]  Subjective/Objective Assessment:   adm: EXCISIONAL BIOPSY OF LEFT SUPRA CLAVICULAR NECK MASS (Left)     Action/Plan:   discharge planning   Anticipated DC Date:  12/12/2014   Anticipated DC Plan:  [facility]  CM consult      Choice offered to / List presented to:     DME arranged  [employer]  NEBULIZER MACHINE      DME agency  [facility].        Status of service:  Completed, signed off Medicare Important Message given?   (If response is "NO", the following Medicare IM given date fields will be blank) Date Medicare IM given:   Medicare IM given by:   Date Additional Medicare IM given:   Additional Medicare IM given by:    Discharge Disposition:  HOME/SELF CARE  Per UR Regulation:    If discussed at Long Length of Stay Meetings, dates discussed:    Comments:  12/12/14 16:00 CM received call from RN stating pt needed a nebulizer machine delivered to his home and would i please arrange.  CM called [facility] DME rep then [facility] rep, Lecretia to please arrange.  Pura Spice states she will arrange.   No other CM needs.  Mariane Masters,  BSN, Jearld Lesch [phone].  12/12/14 Cm received call from RN requesting home O2 arrangement.  CM called [facility] DME rep, Jeneen Rinks to please deliver O2 to room prior to dischcarge. No other  CM needs were communicated.  Mariane Masters,

## 2014-12-12 NOTE — Discharge Summary (Signed)
Physician Discharge Summary  [name]:[id number] DOB: May 07, 1956 DOA: 12/11/2014  PCP: Walker Kehr, MD  Admit date: 12/11/2014 Discharge date: 12/12/2014  Time spent: 30 minutes  Recommendations for Outpatient Follow-up:  1. Follow up with PCP in one week.  2. You will need referral to see a pulmonologist,  3. You will need a sleep study for evaluation of OSA. 4. Please follow up with ENT for biopsy results.   Discharge Diagnoses:  Principal Problem:   Acute on chronic respiratory failure with hypoxia Active Problems:   Mass of neck   Morbid obesity   Chronic diastolic heart failure   Diabetes mellitus type 2, controlled   Depression   OSA (obstructive sleep apnea)   Hypertension   Coronary artery disease   Acute respiratory failure   Acute respiratory failure with hypoxia   Discharge Condition: improved.   Diet recommendation: low sodium diet  Filed Weights   12/11/14 0548 12/11/14 2030  Weight: 155.584 kg (343 lb) 155.584 kg (343 lb)    History of present illness:  [name] is a 59 y.o. male  Past medical history of obstructive sleep apnea refusing C Pap, COPD with ongoing tobacco abuse, CAD and diastolic CHF has had a chronic lump on the left side of his neck that lately had been getting bigger, so he underwent a biopsy by ENT , post op his oxygen sats were in low 80's several hours , and hospitalist were called for admission and evaluation.   Hospital Course:  Neck mass: S/p biopsy of the neck mass yesterday and please follow up the results with MD on may 9 th at your appointment.    Acute respiratory failure with hypoxia and hypercarbia: no wheezing or exacerbation of COPD.  Recommend outpatient follow up with pulmonologist on discharge.  He required oxygen today as his sats dropped to less than 88% on ambulation, his sats at rest and on RA are 88 to 90%.  He vehemently refuses to wear oxygen.  He refuses to wear cpap for obstructive sleep  apnea.    Hypertension: well controlled.    CAD: resume aspirin and plavix.    Procedures:  none  Consultations: none Discharge Exam: Filed Vitals:   12/12/14 0515  BP: 139/53  Pulse:   Temp: 98.4 F (36.9 C)  Resp: 18    General: alert afebrile comfortable Cardiovascular: s1s2 Respiratory: ctab  Discharge Instructions   Discharge Instructions    Diet - low sodium heart healthy    Complete by:  As directed      Discharge instructions    Complete by:  As directed   1. Limited activity 2. Liquid and soft diet, advance as tolerated 3. May bathe and shower day after surgery 4. Wound care - Gentle cleaning with soap and water 5. DO NOT APPLY ANY OINTMENT 6. Elevate Head of Bed     Increase activity slowly    Complete by:  As directed           Current Discharge Medication List    START taking these medications   Details  albuterol (PROVENTIL) (2.5 MG/3ML) 0.083% nebulizer solution Take 3 mLs (2.5 mg total) by nebulization every 2 (two) hours as needed for wheezing. Qty: 75 mL, Refills: 2    tiotropium (SPIRIVA) 18 MCG inhalation capsule Place 1 capsule (18 mcg total) into inhaler and inhale daily. Qty: 30 capsule, Refills: 2      CONTINUE these medications which have NOT CHANGED   Details  aspirin EC 81 MG tablet Take 81 mg by mouth daily.    clonazePAM (KLONOPIN) 0.5 MG tablet TAKE 1 TABLET BY MOUTH TWICE A DAY AS NEEDED FOR ANXIETY or at hs for insomnia Qty: 180 tablet, Refills: 0    clopidogrel (PLAVIX) 75 MG tablet TAKE 1 TABLET BY MOUTH DAILY WITH BREAKFAST. Qty: 90 tablet, Refills: 3    metoprolol succinate (TOPROL-XL) 50 MG 24 hr tablet TAKE 2 TABLETS EVERY DAY Qty: 180 tablet, Refills: 3    nitroGLYCERIN (NITROSTAT) 0.4 MG SL tablet Place 1 tablet (0.4 mg total) under the tongue every 5 (five) minutes as needed for chest pain (Up to 3 doses). Qty: 25 tablet, Refills: 4    potassium chloride SA (KLOR-CON M20) 20 MEQ tablet TAKE 1 TABLET BY  MOUTH DAILY Qty: 90 tablet, Refills: 3    simvastatin (ZOCOR) 20 MG tablet TAKE 1 TABLET (20 MG TOTAL) BY MOUTH AT BEDTIME. Qty: 90 tablet, Refills: 3    torsemide (DEMADEX) 100 MG tablet Take 100 mg by mouth daily.    traMADol (ULTRAM) 50 MG tablet TAKE 1 TO 2 TABLETS BY MOUTH TWICE A DAY AS NEEDED FOR PAIN Qty: 360 tablet, Refills: 0    Umeclidinium-Vilanterol (ANORO ELLIPTA) 62.5-25 MCG/INH AEPB INHALE 1 ACT INTO THE LUNGS DAILY Qty: 180 each, Refills: 3    naproxen sodium (ANAPROX) 220 MG tablet Take 1 tablet (220 mg total) by mouth as needed. Qty: 60 tablet, Refills: 2       Allergies  Allergen Reactions  . Heparin Other (See Comments)    Opposite reaction   Follow-up Information    Follow up with SHOEMAKER, DAVID, MD In 10 days.   Specialty:  Otolaryngology   Contact information:   [address] [place] [address] [phone]       Follow up with Walker Kehr, MD. Schedule an appointment as soon as possible for a visit in 1 week.   Specialty:  Internal Medicine   Contact information:   [place] [address] [phone]       Follow up with [facility].   Why:  home O2   Contact information:   [address] [place] [address] [phone]        The results of significant diagnostics from this hospitalization (including imaging, microbiology, ancillary and laboratory) are listed below for reference.    Significant Diagnostic Studies: Ct Soft Tissue Neck W Contrast  11/13/2014   CLINICAL DATA:  Left supra supraclavicular swelling. Progressive enlargement over 5 years  EXAM: CT NECK WITH CONTRAST  TECHNIQUE: Multidetector CT imaging of the neck was performed using the standard protocol following the bolus administration of intravenous contrast.  CONTRAST:  75 mL Isovue-300 IV  COMPARISON:  CT neck 10/15/2008, head CT 06/24/2009  FINDINGS: Pharynx and larynx: Negative  Salivary glands: Fatty  infiltration of the submandibular and parotid glands bilaterally. No mass or calculus.  Thyroid: Negative  Lymph nodes: Large supraclavicular mass on the left has enlarged since 2010. Mass currently measures 4.7 x 4.4 cm with homogeneous density and mild enhancement. No necrosis or calcification. Adjacent lymph nodes in the left lower neck measure 18 x 11 mm posterior to the larger mass, and 12 mm and 7 mm superior and lateral to the mass. Jugular lymph nodes not enlarged bilaterally. Right level 1 lymph node measures 7 mm. Right supraclavicular lymph node measures 7 x 14 mm.  Subcutaneous mass in the  right posterior back measures 3.0 cm and is similar to the prior study. Probable sebaceous cyst.  Vascular: Atherosclerotic disease. Carotid and jugular are patent bilaterally.  Limited intracranial: Negative  Visualized orbits: Not imaged  Mastoids and visualized paranasal sinuses: Negative  Skeleton: Cervical degenerative change.  No focal bony lesion.  Upper chest: Negative upper chest.  Prior CABG.  IMPRESSION: Left supraclavicular mass lesion shows significant interval growth since 2010. Adjacent left supraclavicular lymph nodes also show mild interval growth. Right supraclavicular lymph nodes 7 x 14 mm. 7 mm right submental lymph node.  Favor lymphoma. Metastatic disease considered less likely given the indolent course. Biopsy recommended.  Cystic lesion right posterior subcutaneous tissues is stable and most consistent with sebaceous cyst. Tumor involvement considered less likely.   Electronically Signed   By: Franchot Gallo M.D.   On: 11/13/2014 13:44    Microbiology: No results found for this or any previous visit (from the past 240 hour(s)).   Labs: Basic Metabolic Panel: No results for input(s): NA, K, CL, CO2, GLUCOSE, BUN, CREATININE, CALCIUM, MG, PHOS in the last 168 hours. Liver Function Tests: No results for input(s): AST, ALT, ALKPHOS, BILITOT, PROT, ALBUMIN in the last 168 hours. No results  for input(s): LIPASE, AMYLASE in the last 168 hours. No results for input(s): AMMONIA in the last 168 hours. CBC: No results for input(s): WBC, NEUTROABS, HGB, HCT, MCV, PLT in the last 168 hours. Cardiac Enzymes: No results for input(s): CKTOTAL, CKMB, CKMBINDEX, TROPONINI in the last 168 hours. BNP: BNP (last 3 results)  Recent Labs  12/11/14 1550  BNP 77.1    ProBNP (last 3 results) No results for input(s): PROBNP in the last 8760 hours.  CBG:  Recent Labs Lab 12/11/14 0557 12/11/14 0909  GLUCAP 106* 152*       Signed:  Roshawn Ayala  [facility] 12/12/2014, 1:48 PM

## 2014-12-12 NOTE — Progress Notes (Signed)
Pt ambulated in room air with pulse oximetry on, pt O2 sat was 81%.

## 2014-12-12 NOTE — Progress Notes (Addendum)
Sat is 91-95 on room air at this time, will recheck with activity in room. Pt says he really doesn't want oxygen at home. He is a smoker, discussed need to quit smoking.  If sats ok with activity on room air will notify MD. After amb in room sats are 90-94 wants to go home without oxygen, will let MD know.                 At this time sats are 84-90 with activity, needs home o2 set up, case manager notified at 1120.

## 2014-12-14 ENCOUNTER — Encounter (HOSPITAL_COMMUNITY): Payer: Self-pay | Admitting: Otolaryngology

## 2014-12-15 ENCOUNTER — Telehealth: Payer: Self-pay | Admitting: *Deleted

## 2014-12-15 NOTE — Telephone Encounter (Signed)
Ok to ref OV q 3 mo Thx

## 2014-12-15 NOTE — Telephone Encounter (Signed)
Rf req for Klonopin 0.5 mg take 1 po bid prn anxiety OR at bedtime for insomnia. # 180      AND        Ultram 50 mg 1-2 po bid prn pain. # 360. Ok to Rf?

## 2014-12-16 MED ORDER — TRAMADOL HCL 50 MG PO TABS: ORAL_TABLET | ORAL | Status: DC

## 2014-12-16 MED ORDER — CLONAZEPAM 0.5 MG PO TABS: ORAL_TABLET | ORAL | Status: DC

## 2014-12-16 NOTE — Telephone Encounter (Signed)
Scripts fax back to pharmacy...[name]

## 2014-12-21 ENCOUNTER — Other Ambulatory Visit (HOSPITAL_BASED_OUTPATIENT_CLINIC_OR_DEPARTMENT_OTHER): Payer: BLUE CROSS/BLUE SHIELD

## 2014-12-21 ENCOUNTER — Telehealth: Payer: Self-pay | Admitting: Hematology and Oncology

## 2014-12-21 ENCOUNTER — Ambulatory Visit: Payer: BLUE CROSS/BLUE SHIELD

## 2014-12-21 ENCOUNTER — Ambulatory Visit (HOSPITAL_BASED_OUTPATIENT_CLINIC_OR_DEPARTMENT_OTHER): Payer: BLUE CROSS/BLUE SHIELD | Admitting: Hematology and Oncology

## 2014-12-21 ENCOUNTER — Encounter: Payer: Self-pay | Admitting: Hematology and Oncology

## 2014-12-21 VITALS — BP 129/47 | HR 65 | Temp 98.3°F | Resp 19 | Ht 72.0 in | Wt 354.0 lb

## 2014-12-21 DIAGNOSIS — C8290 Follicular lymphoma, unspecified, unspecified site: Secondary | ICD-10-CM | POA: Insufficient documentation

## 2014-12-21 DIAGNOSIS — C8590 Non-Hodgkin lymphoma, unspecified, unspecified site: Secondary | ICD-10-CM

## 2014-12-21 DIAGNOSIS — C8234 Follicular lymphoma grade IIIa, lymph nodes of axilla and upper limb: Secondary | ICD-10-CM

## 2014-12-21 DIAGNOSIS — C8230 Follicular lymphoma grade IIIa, unspecified site: Secondary | ICD-10-CM

## 2014-12-21 DIAGNOSIS — I2510 Atherosclerotic heart disease of native coronary artery without angina pectoris: Secondary | ICD-10-CM

## 2014-12-21 HISTORY — DX: Non-Hodgkin lymphoma, unspecified, unspecified site: C85.90

## 2014-12-21 LAB — CBC WITH DIFFERENTIAL/PLATELET
BASO%: 0.5 % (ref 0.0–2.0)
BASOS ABS: 0.1 10*3/uL (ref 0.0–0.1)
EOS%: 3.3 % (ref 0.0–7.0)
Eosinophils Absolute: 0.4 10*3/uL (ref 0.0–0.5)
HCT: 46.2 % (ref 38.4–49.9)
HEMOGLOBIN: 15.2 g/dL (ref 13.0–17.1)
LYMPH%: 14.1 % (ref 14.0–49.0)
MCH: 32.4 pg (ref 27.2–33.4)
MCHC: 32.9 g/dL (ref 32.0–36.0)
MCV: 98.5 fL — AB (ref 79.3–98.0)
MONO#: 1.2 10*3/uL — ABNORMAL HIGH (ref 0.1–0.9)
MONO%: 10.9 % (ref 0.0–14.0)
NEUT#: 7.6 10*3/uL — ABNORMAL HIGH (ref 1.5–6.5)
NEUT%: 71.2 % (ref 39.0–75.0)
Platelets: 140 10*3/uL (ref 140–400)
RBC: 4.69 10*6/uL (ref 4.20–5.82)
RDW: 13 % (ref 11.0–14.6)
WBC: 10.7 10*3/uL — ABNORMAL HIGH (ref 4.0–10.3)
lymph#: 1.5 10*3/uL (ref 0.9–3.3)

## 2014-12-21 LAB — COMPREHENSIVE METABOLIC PANEL (CC13)
ALK PHOS: 83 U/L (ref 40–150)
ALT: 28 U/L (ref 0–55)
AST: 21 U/L (ref 5–34)
Albumin: 3.9 g/dL (ref 3.5–5.0)
Anion Gap: 12 mEq/L — ABNORMAL HIGH (ref 3–11)
BILIRUBIN TOTAL: 0.67 mg/dL (ref 0.20–1.20)
BUN: 15 mg/dL (ref 7.0–26.0)
CO2: 31 mEq/L — ABNORMAL HIGH (ref 22–29)
Calcium: 9.1 mg/dL (ref 8.4–10.4)
Chloride: 97 mEq/L — ABNORMAL LOW (ref 98–109)
Creatinine: 0.9 mg/dL (ref 0.7–1.3)
Glucose: 103 mg/dl (ref 70–140)
Potassium: 4.1 mEq/L (ref 3.5–5.1)
SODIUM: 140 meq/L (ref 136–145)
TOTAL PROTEIN: 7.3 g/dL (ref 6.4–8.3)

## 2014-12-21 LAB — LACTATE DEHYDROGENASE (CC13): LDH: 186 U/L (ref 125–245)

## 2014-12-21 LAB — URIC ACID (CC13): Uric Acid, Serum: 9.2 mg/dl — ABNORMAL HIGH (ref 2.6–7.4)

## 2014-12-21 NOTE — Telephone Encounter (Signed)
new patient appt-left message for patient and gave appt for 05/09 @ 12 w/Dr. Alvy Bimler.

## 2014-12-21 NOTE — Assessment & Plan Note (Signed)
He has significant cardiac history and I do not want to subject him to Adriamycin containing regimen. I will proceed to get a baseline echocardiogram before we proceed with treatment

## 2014-12-21 NOTE — Progress Notes (Signed)
[facility] NOTE  Patient Care Team: [name], MD as PCP - General (Internal Medicine) [name], MD as Consulting Physician (Cardiology)  CHIEF COMPLAINTS/PURPOSE OF CONSULTATION:  Newly diagnosed lymphoma  HISTORY OF PRESENTING ILLNESS:  [name] 59 y.o. male is here because of newly diagnosed lymphoma. I review his records extensively and collaborated the history with the patient.   Follicular lymphoma grade 3a   06/24/2009 Imaging PET CT scan showed two small FDG positive left neck nodes   12/11/2014 Surgery He underwent excisional lymph node biopsy of the left supraclavicular lymph node/neck region   12/11/2014 Pathology Results Accession: [id number] biopsy show follicular lymphoma   He has significant cardiac history with bypass surgery in 2005 and carotid endarterectomy in 2010. Ultrasound of the left neck in 2010 revealed abnormalities, comfirmed on PET CT scan. He was not symptomatic at the time was observed. Over the past year, he started to develop regular night sweats and progressive lymphadenopathy in the left side of the neck. He also complained of mild lymphadenopathy on the right axilla. He was subsequently seen by ENT surgeon who did excisional lymph node biopsy last month and confirm diagnosis of follicular lymphoma. He denies skin itching, anorexia or weight loss.  MEDICAL HISTORY:  Past Medical History  Diagnosis Date  . Heparin induced thrombocytopenia   . Hypertension   . Hyperlipidemia   . Obesity   . Edema   . COPD (chronic obstructive pulmonary disease)   . GERD (gastroesophageal reflux disease)   . Basal cell carcinoma of nose   . Chronic lower back pain   . Depression   . Myocardial infarction   . Heart murmur   . Dysrhythmia     fluttering  . Anginal pain     not had to use in awhile  none in 10 years  . CHF (congestive heart failure)   . Shortness of breath dyspnea     with exertion   . Diabetes  mellitus without complication     borderline   . Anxiety   . Blood dyscrasia     trouble clotting   . Coronary artery disease     CABG 2005. s/p PTCA and stenting of the saphenous vein graft to PDA and saphenous vein graft to obtuse marginal by Dr. Burt Knack 09/29/11. Normal EF at cath 09/2011  . Sleep apnea     "used to"      could not use 2006  . Carotid artery disease     s/p L CEA 2009 (hx of evacuation of hematoma due to heparin)  . Lymphoma 12/21/2014    SURGICAL HISTORY: Past Surgical History  Procedure Laterality Date  . Evacuation of hematoma  03/2008    left neck; S/P endarterectomy; "cause heparin clotted it up"  . Coronary angioplasty with stent placement  09/29/11    "2"  . Carotid endarterectomy  03/2008    left  . Coronary artery bypass graft  2005    CABG X3  . Basal cell carcinoma excision  2000's    nose  . Percutaneous coronary stent intervention (pci-s) N/A 09/29/2011    Procedure: PERCUTANEOUS CORONARY STENT INTERVENTION (PCI-S);  Surgeon: Sherren Mocha, MD;  Location: [facility] CATH LAB;  Service: Cardiovascular;  Laterality: N/A;  . Mass excision Left 12/11/2014    Procedure: EXCISIONAL BIOPSY OF LEFT SUPRA CLAVICULAR NECK MASS;  Surgeon: Jerrell Belfast, MD;  Location: [place];  Service: ENT;  Laterality: Left;  . Superficial lymph  node biopsy / excision Left     SOCIAL HISTORY: History   Social History  . Marital Status: Married    Spouse Name: N/A  . Number of Children: 1  . Years of Education: N/A   Occupational History  . cable installer    Social History Main Topics  . Smoking status: Current Every Day Smoker -- 2.00 packs/day for 41 years    Types: Cigarettes  . Smokeless tobacco: Former Systems developer    Types: Chew     Comment: consult entered  . Alcohol Use: Yes     Comment: 09/29/11 "quit years ago"  . Drug Use: Yes    Special: Marijuana     Comment: "last time was in the 1990's"  . Sexual Activity: Not Currently   Other Topics Concern  . Not on file    Social History Narrative    FAMILY HISTORY: Family History  Problem Relation Age of Onset  . Heart disease Father   . Bone cancer Mother     ALLERGIES:  is allergic to heparin.  MEDICATIONS:  Current Outpatient Prescriptions  Medication Sig Dispense Refill  . albuterol (PROVENTIL) (2.5 MG/3ML) 0.083% nebulizer solution Take 3 mLs (2.5 mg total) by nebulization every 2 (two) hours as needed for wheezing. 75 mL 2  . aspirin EC 81 MG tablet Take 81 mg by mouth daily.    . clonazePAM (KLONOPIN) 0.5 MG tablet TAKE 1 TABLET BY MOUTH TWICE A DAY AS NEEDED FOR ANXIETY or at hs for insomnia 180 tablet 0  . clopidogrel (PLAVIX) 75 MG tablet TAKE 1 TABLET BY MOUTH DAILY WITH BREAKFAST. 90 tablet 3  . metoprolol succinate (TOPROL-XL) 50 MG 24 hr tablet TAKE 2 TABLETS EVERY DAY 180 tablet 3  . naproxen sodium (ANAPROX) 220 MG tablet Take 1 tablet (220 mg total) by mouth as needed. 60 tablet 2  . nitroGLYCERIN (NITROSTAT) 0.4 MG SL tablet Place 1 tablet (0.4 mg total) under the tongue every 5 (five) minutes as needed for chest pain (Up to 3 doses). 25 tablet 4  . potassium chloride SA (KLOR-CON M20) 20 MEQ tablet TAKE 1 TABLET BY MOUTH DAILY 90 tablet 3  . simvastatin (ZOCOR) 20 MG tablet TAKE 1 TABLET (20 MG TOTAL) BY MOUTH AT BEDTIME. 90 tablet 3  . tiotropium (SPIRIVA) 18 MCG inhalation capsule Place 1 capsule (18 mcg total) into inhaler and inhale daily. 30 capsule 2  . torsemide (DEMADEX) 100 MG tablet Take 100 mg by mouth daily.    . traMADol (ULTRAM) 50 MG tablet TAKE 1 TO 2 TABLETS BY MOUTH TWICE A DAY AS NEEDED FOR PAIN 360 tablet 0  . Umeclidinium-Vilanterol (ANORO ELLIPTA) 62.5-25 MCG/INH AEPB INHALE 1 ACT INTO THE LUNGS DAILY 180 each 3   No current facility-administered medications for this visit.    REVIEW OF SYSTEMS:   Constitutional: Denies fevers, chills  Eyes: Denies blurriness of vision, double vision or watery eyes Ears, nose, mouth, throat, and face: Denies mucositis or  sore throat Respiratory: Denies cough, dyspnea or wheezes. He had shortness of breath on moderate exertion Cardiovascular: Denies palpitation, chest discomfort or lower extremity swelling Gastrointestinal:  Denies nausea, heartburn or change in bowel habits Skin: Denies abnormal skin rashes Neurological:Denies numbness, tingling or new weaknesses Behavioral/Psych: Mood is stable, no new changes  All other systems were reviewed with the patient and are negative.  PHYSICAL EXAMINATION: ECOG PERFORMANCE STATUS: 1 - Symptomatic but completely ambulatory  Filed Vitals:   12/21/14 1248  BP: 129/47  Pulse: 65  Temp: 98.3 F (36.8 C)  Resp: 19   Filed Weights   12/21/14 1248  Weight: 354 lb (160.573 kg)    GENERAL:alert, no distress and comfortable. He is morbidly obese SKIN: skin color, texture, turgor are normal, no rashes or significant lesions EYES: normal, conjunctiva are pink and non-injected, sclera clear OROPHARYNX:no exudate, no erythema and lips, buccal mucosa, and tongue normal  NECK: supple, thyroid normal size, non-tender, without nodularity LYMPH:  Palpable lymphadenopathy on the left side of the neck. There is fullness in the right axilla.  LUNGS: clear to auscultation and percussion with normal breathing effort HEART: regular rate & rhythm and no murmurs with moderate bilateral lower extremity edema ABDOMEN:abdomen soft, non-tender and normal bowel sounds. Unable to appreciate splenomegaly, limited exam by obesity Musculoskeletal:no cyanosis of digits and no clubbing  PSYCH: alert & oriented x 3 with fluent speech NEURO: no focal motor/sensory deficits  LABORATORY DATA:  I have reviewed the data as listed Lab Results  Component Value Date   WBC 10.7* 12/21/2014   HGB 15.2 12/21/2014   HCT 46.2 12/21/2014   MCV 98.5* 12/21/2014   PLT 140 12/21/2014    Recent Labs  03/25/14 1631 09/25/14 1633 12/04/14 1427 12/21/14 1414  NA 139 139 141 140  K 4.3 4.4 4.3  4.1  CL 99 100 100  --   CO2 33* 37* 33* 31*  GLUCOSE 88 97 132* 103  BUN 14 13 16  15.0  CREATININE 1.1 1.00 1.05 0.9  CALCIUM 9.5 9.5 9.3 9.1  GFRNONAA  --   --  75*  --   GFRAA  --   --  87*  --   PROT  --   --   --  7.3  ALBUMIN  --   --   --  3.9  AST  --   --   --  21  ALT  --   --   --  28  ALKPHOS  --   --   --  83  BILITOT  --   --   --  0.67    RADIOGRAPHIC STUDIES: I reviewed the imaging study myself I have personally reviewed the radiological images as listed and agreed with the findings in the report.  ASSESSMENT & PLAN:  Follicular lymphoma grade 3a We discussed the importance of staging. The patient is morbidly obese and is on multiple different antiplatelet agents. I recommend holding off bone marrow aspirate and biopsy. We will proceed with PET CT scan for staging. I will order echocardiogram to evaluate his baseline cardiac function status. I do not believe he is a candidate for R-CHOP chemotherapy given his significant other clinical comorbidities. I recommend port placement and consideration for treatment with bendamustine and rituximab. I plan to start him on treatment next week if possible.   Coronary artery disease He has significant cardiac history and I do not want to subject him to Adriamycin containing regimen. I will proceed to get a baseline echocardiogram before we proceed with treatment      All questions were answered. The patient knows to call the clinic with any problems, questions or concerns. I spent 40 minutes counseling the patient face to face. The total time spent in the appointment was 60 minutes and more than 50% was on counseling.     [facility], [place], MD 12/21/2014 4:18 PM

## 2014-12-21 NOTE — Telephone Encounter (Signed)
Pt confirmed labs/ov per 05/09 POF, gave pt AVS and Calendar..... KJ, sent msg to add chemo and also msg to have MD visit on 05/18 @1pm  to be added to schedule.Marland KitchenMarland KitchenMarland Kitchen

## 2014-12-21 NOTE — Assessment & Plan Note (Signed)
We discussed the importance of staging. The patient is morbidly obese and is on multiple different antiplatelet agents. I recommend holding off bone marrow aspirate and biopsy. We will proceed with PET CT scan for staging. I will order echocardiogram to evaluate his baseline cardiac function status. I do not believe he is a candidate for R-CHOP chemotherapy given his significant other clinical comorbidities. I recommend port placement and consideration for treatment with bendamustine and rituximab. I plan to start him on treatment next week if possible.

## 2014-12-22 ENCOUNTER — Encounter: Payer: Self-pay | Admitting: *Deleted

## 2014-12-22 ENCOUNTER — Other Ambulatory Visit: Payer: BLUE CROSS/BLUE SHIELD

## 2014-12-22 LAB — HEPATITIS B CORE ANTIBODY, IGM: HEP B C IGM: NONREACTIVE

## 2014-12-22 LAB — HEPATITIS B SURFACE ANTIBODY,QUALITATIVE: Hep B S Ab: NEGATIVE

## 2014-12-22 LAB — HEPATITIS B SURFACE ANTIGEN: Hepatitis B Surface Ag: NEGATIVE

## 2014-12-23 ENCOUNTER — Other Ambulatory Visit: Payer: Self-pay | Admitting: *Deleted

## 2014-12-23 ENCOUNTER — Other Ambulatory Visit: Payer: Self-pay | Admitting: Hematology and Oncology

## 2014-12-23 DIAGNOSIS — I2510 Atherosclerotic heart disease of native coronary artery without angina pectoris: Secondary | ICD-10-CM

## 2014-12-23 DIAGNOSIS — C8230 Follicular lymphoma grade IIIa, unspecified site: Secondary | ICD-10-CM

## 2014-12-24 ENCOUNTER — Ambulatory Visit (HOSPITAL_COMMUNITY): Payer: BLUE CROSS/BLUE SHIELD

## 2014-12-24 ENCOUNTER — Ambulatory Visit (HOSPITAL_COMMUNITY): Payer: BLUE CROSS/BLUE SHIELD | Attending: Cardiology

## 2014-12-24 ENCOUNTER — Telehealth: Payer: Self-pay | Admitting: *Deleted

## 2014-12-24 ENCOUNTER — Other Ambulatory Visit: Payer: Self-pay

## 2014-12-24 DIAGNOSIS — G4730 Sleep apnea, unspecified: Secondary | ICD-10-CM | POA: Insufficient documentation

## 2014-12-24 DIAGNOSIS — E785 Hyperlipidemia, unspecified: Secondary | ICD-10-CM | POA: Diagnosis not present

## 2014-12-24 DIAGNOSIS — I77819 Aortic ectasia, unspecified site: Secondary | ICD-10-CM | POA: Insufficient documentation

## 2014-12-24 DIAGNOSIS — F419 Anxiety disorder, unspecified: Secondary | ICD-10-CM | POA: Insufficient documentation

## 2014-12-24 DIAGNOSIS — C8230 Follicular lymphoma grade IIIa, unspecified site: Secondary | ICD-10-CM

## 2014-12-24 DIAGNOSIS — E119 Type 2 diabetes mellitus without complications: Secondary | ICD-10-CM | POA: Insufficient documentation

## 2014-12-24 DIAGNOSIS — C8290 Follicular lymphoma, unspecified, unspecified site: Secondary | ICD-10-CM | POA: Diagnosis present

## 2014-12-24 DIAGNOSIS — J449 Chronic obstructive pulmonary disease, unspecified: Secondary | ICD-10-CM | POA: Diagnosis not present

## 2014-12-24 DIAGNOSIS — F329 Major depressive disorder, single episode, unspecified: Secondary | ICD-10-CM | POA: Insufficient documentation

## 2014-12-24 DIAGNOSIS — K219 Gastro-esophageal reflux disease without esophagitis: Secondary | ICD-10-CM | POA: Insufficient documentation

## 2014-12-24 DIAGNOSIS — I10 Essential (primary) hypertension: Secondary | ICD-10-CM | POA: Insufficient documentation

## 2014-12-24 DIAGNOSIS — G4700 Insomnia, unspecified: Secondary | ICD-10-CM | POA: Diagnosis not present

## 2014-12-24 DIAGNOSIS — I2510 Atherosclerotic heart disease of native coronary artery without angina pectoris: Secondary | ICD-10-CM | POA: Diagnosis not present

## 2014-12-24 DIAGNOSIS — F17200 Nicotine dependence, unspecified, uncomplicated: Secondary | ICD-10-CM | POA: Insufficient documentation

## 2014-12-24 NOTE — Telephone Encounter (Signed)
Pt had called to notify that his PET scan is not scheduled until next week on 5/19.  He is due to start chemo on 5/19.  Called PET dept and s/w Wynn.  She says they can get pt in tomorrow morning at 7 am. Arrive at 6;45 am and NPO for 6 hrs prior to exam.   Instructed pt on appt for PET tomorrow morning at [facility], arrive at 6:45 am and NPO for 6 hrs including no candy or gum.  Pt verbalized understanding.

## 2014-12-25 ENCOUNTER — Encounter (HOSPITAL_COMMUNITY)
Admission: RE | Admit: 2014-12-25 | Discharge: 2014-12-25 | Disposition: A | Discharge status: Home / Self Care | Payer: BLUE CROSS/BLUE SHIELD | Source: Ambulatory Visit | Attending: Hematology and Oncology | Admitting: Hematology and Oncology

## 2014-12-25 ENCOUNTER — Other Ambulatory Visit: Payer: Self-pay | Admitting: Radiology

## 2014-12-25 DIAGNOSIS — I2510 Atherosclerotic heart disease of native coronary artery without angina pectoris: Secondary | ICD-10-CM | POA: Diagnosis not present

## 2014-12-25 DIAGNOSIS — K8020 Calculus of gallbladder without cholecystitis without obstruction: Secondary | ICD-10-CM | POA: Insufficient documentation

## 2014-12-25 DIAGNOSIS — J929 Pleural plaque without asbestos: Secondary | ICD-10-CM | POA: Diagnosis not present

## 2014-12-25 DIAGNOSIS — Z951 Presence of aortocoronary bypass graft: Secondary | ICD-10-CM | POA: Insufficient documentation

## 2014-12-25 DIAGNOSIS — C8590 Non-Hodgkin lymphoma, unspecified, unspecified site: Secondary | ICD-10-CM | POA: Insufficient documentation

## 2014-12-25 DIAGNOSIS — N200 Calculus of kidney: Secondary | ICD-10-CM | POA: Diagnosis not present

## 2014-12-25 DIAGNOSIS — I517 Cardiomegaly: Secondary | ICD-10-CM | POA: Diagnosis not present

## 2014-12-25 LAB — GLUCOSE, CAPILLARY: GLUCOSE-CAPILLARY: 121 mg/dL — AB (ref 65–99)

## 2014-12-25 MED ORDER — FLUDEOXYGLUCOSE F - 18 (FDG) INJECTION
16.0000 | Freq: Once | INTRAVENOUS | Status: AC | PRN
  Administered 2014-12-25: 16 via INTRAVENOUS

## 2014-12-28 ENCOUNTER — Encounter (HOSPITAL_COMMUNITY): Payer: Self-pay

## 2014-12-28 ENCOUNTER — Ambulatory Visit (HOSPITAL_COMMUNITY)
Admission: RE | Admit: 2014-12-28 | Discharge: 2014-12-28 | Disposition: A | Discharge status: Home / Self Care | Payer: BLUE CROSS/BLUE SHIELD | Source: Ambulatory Visit | Attending: Hematology and Oncology | Admitting: Hematology and Oncology

## 2014-12-28 ENCOUNTER — Telehealth: Payer: Self-pay | Admitting: Internal Medicine

## 2014-12-28 ENCOUNTER — Other Ambulatory Visit: Payer: Self-pay | Admitting: Hematology and Oncology

## 2014-12-28 DIAGNOSIS — G4730 Sleep apnea, unspecified: Secondary | ICD-10-CM | POA: Insufficient documentation

## 2014-12-28 DIAGNOSIS — E785 Hyperlipidemia, unspecified: Secondary | ICD-10-CM | POA: Diagnosis not present

## 2014-12-28 DIAGNOSIS — R7309 Other abnormal glucose: Secondary | ICD-10-CM | POA: Insufficient documentation

## 2014-12-28 DIAGNOSIS — Z7902 Long term (current) use of antithrombotics/antiplatelets: Secondary | ICD-10-CM | POA: Diagnosis not present

## 2014-12-28 DIAGNOSIS — E669 Obesity, unspecified: Secondary | ICD-10-CM | POA: Insufficient documentation

## 2014-12-28 DIAGNOSIS — G8929 Other chronic pain: Secondary | ICD-10-CM | POA: Diagnosis not present

## 2014-12-28 DIAGNOSIS — F419 Anxiety disorder, unspecified: Secondary | ICD-10-CM | POA: Insufficient documentation

## 2014-12-28 DIAGNOSIS — Z951 Presence of aortocoronary bypass graft: Secondary | ICD-10-CM | POA: Insufficient documentation

## 2014-12-28 DIAGNOSIS — I10 Essential (primary) hypertension: Secondary | ICD-10-CM | POA: Diagnosis not present

## 2014-12-28 DIAGNOSIS — J449 Chronic obstructive pulmonary disease, unspecified: Secondary | ICD-10-CM | POA: Insufficient documentation

## 2014-12-28 DIAGNOSIS — I509 Heart failure, unspecified: Secondary | ICD-10-CM | POA: Insufficient documentation

## 2014-12-28 DIAGNOSIS — Z79899 Other long term (current) drug therapy: Secondary | ICD-10-CM | POA: Insufficient documentation

## 2014-12-28 DIAGNOSIS — M545 Low back pain: Secondary | ICD-10-CM | POA: Insufficient documentation

## 2014-12-28 DIAGNOSIS — F17210 Nicotine dependence, cigarettes, uncomplicated: Secondary | ICD-10-CM | POA: Insufficient documentation

## 2014-12-28 DIAGNOSIS — I2510 Atherosclerotic heart disease of native coronary artery without angina pectoris: Secondary | ICD-10-CM | POA: Diagnosis not present

## 2014-12-28 DIAGNOSIS — F329 Major depressive disorder, single episode, unspecified: Secondary | ICD-10-CM | POA: Diagnosis not present

## 2014-12-28 DIAGNOSIS — I252 Old myocardial infarction: Secondary | ICD-10-CM | POA: Diagnosis not present

## 2014-12-28 DIAGNOSIS — Z7982 Long term (current) use of aspirin: Secondary | ICD-10-CM | POA: Diagnosis not present

## 2014-12-28 DIAGNOSIS — C8590 Non-Hodgkin lymphoma, unspecified, unspecified site: Secondary | ICD-10-CM | POA: Diagnosis not present

## 2014-12-28 DIAGNOSIS — Z6841 Body Mass Index (BMI) 40.0 and over, adult: Secondary | ICD-10-CM | POA: Diagnosis not present

## 2014-12-28 DIAGNOSIS — Z85828 Personal history of other malignant neoplasm of skin: Secondary | ICD-10-CM | POA: Insufficient documentation

## 2014-12-28 DIAGNOSIS — Z955 Presence of coronary angioplasty implant and graft: Secondary | ICD-10-CM | POA: Diagnosis not present

## 2014-12-28 DIAGNOSIS — K219 Gastro-esophageal reflux disease without esophagitis: Secondary | ICD-10-CM | POA: Diagnosis not present

## 2014-12-28 LAB — CBC WITH DIFFERENTIAL/PLATELET
Basophils Absolute: 0.1 10*3/uL (ref 0.0–0.1)
Basophils Relative: 1 % (ref 0–1)
EOS PCT: 3 % (ref 0–5)
Eosinophils Absolute: 0.3 10*3/uL (ref 0.0–0.7)
HCT: 47 % (ref 39.0–52.0)
Hemoglobin: 15.7 g/dL (ref 13.0–17.0)
LYMPHS PCT: 13 % (ref 12–46)
Lymphs Abs: 1.5 10*3/uL (ref 0.7–4.0)
MCH: 32.5 pg (ref 26.0–34.0)
MCHC: 33.4 g/dL (ref 30.0–36.0)
MCV: 97.3 fL (ref 78.0–100.0)
Monocytes Absolute: 1.2 10*3/uL — ABNORMAL HIGH (ref 0.1–1.0)
Monocytes Relative: 10 % (ref 3–12)
NEUTROS PCT: 73 % (ref 43–77)
Neutro Abs: 8.1 10*3/uL — ABNORMAL HIGH (ref 1.7–7.7)
PLATELETS: 157 10*3/uL (ref 150–400)
RBC: 4.83 MIL/uL (ref 4.22–5.81)
RDW: 12.7 % (ref 11.5–15.5)
WBC: 11 10*3/uL — AB (ref 4.0–10.5)

## 2014-12-28 LAB — PROTIME-INR
INR: 1 (ref 0.00–1.49)
Prothrombin Time: 13.3 seconds (ref 11.6–15.2)

## 2014-12-28 LAB — APTT: APTT: 30 s (ref 24–37)

## 2014-12-28 MED ORDER — SODIUM CHLORIDE 0.9 % IV SOLN: INTRAVENOUS | Status: DC

## 2014-12-28 MED ORDER — FENTANYL CITRATE (PF) 100 MCG/2ML IJ SOLN
INTRAMUSCULAR | Status: AC | PRN
  Administered 2014-12-28: 50 ug via INTRAVENOUS
  Administered 2014-12-28: 25 ug via INTRAVENOUS

## 2014-12-28 MED ORDER — LIDOCAINE HCL 1 % IJ SOLN
INTRAMUSCULAR | Status: DC
Start: 2014-12-28 — End: 2014-12-29
  Filled 2014-12-28: qty 20

## 2014-12-28 MED ORDER — MIDAZOLAM HCL 2 MG/2ML IJ SOLN
INTRAMUSCULAR | Status: AC | PRN
  Administered 2014-12-28: 1 mg via INTRAVENOUS
  Administered 2014-12-28 (×3): 0.5 mg via INTRAVENOUS
  Administered 2014-12-28: 1 mg via INTRAVENOUS
  Administered 2014-12-28 (×2): 0.5 mg via INTRAVENOUS

## 2014-12-28 MED ORDER — ANTICOAGULANT SODIUM CITRATE 4% (200MG/5ML) IV SOLN
5.0000 mL | Freq: Once | Status: AC
  Administered 2014-12-28: 5 mL via INTRAVENOUS
  Filled 2014-12-28: qty 5

## 2014-12-28 MED ORDER — MIDAZOLAM HCL 2 MG/2ML IJ SOLN
INTRAMUSCULAR | Status: AC
  Filled 2014-12-28: qty 6

## 2014-12-28 MED ORDER — DEXTROSE 5 % IV SOLN
3.0000 g | INTRAVENOUS | Status: AC
  Administered 2014-12-28: 3 g via INTRAVENOUS
  Filled 2014-12-28: qty 3000

## 2014-12-28 MED ORDER — LIDOCAINE-EPINEPHRINE 2 %-1:100000 IJ SOLN
INTRAMUSCULAR | Status: AC
  Filled 2014-12-28: qty 1

## 2014-12-28 MED ORDER — ALBUTEROL SULFATE (2.5 MG/3ML) 0.083% IN NEBU
2.5000 mg | INHALATION_SOLUTION | Freq: Once | RESPIRATORY_TRACT | Status: AC
  Administered 2014-12-28: 2.5 mg via RESPIRATORY_TRACT
  Filled 2014-12-28: qty 3

## 2014-12-28 MED ORDER — FENTANYL CITRATE (PF) 100 MCG/2ML IJ SOLN
INTRAMUSCULAR | Status: AC
  Filled 2014-12-28: qty 4

## 2014-12-28 NOTE — Telephone Encounter (Signed)
Patient is requesting script refill for

## 2014-12-28 NOTE — H&P (Signed)
Chief Complaint: "I'm here for a port a cath"  Referring Physician(s): Gorsuch,Ni  History of Present Illness: [name] is a 59 y.o. male with history of newly diagnosed follicular lymphoma who presents today for Port-A-Cath placement for chemotherapy.  Past Medical History  Diagnosis Date  . Heparin induced thrombocytopenia   . Hypertension   . Hyperlipidemia   . Obesity   . Edema   . COPD (chronic obstructive pulmonary disease)   . GERD (gastroesophageal reflux disease)   . Basal cell carcinoma of nose   . Chronic lower back pain   . Depression   . Myocardial infarction   . Heart murmur   . Dysrhythmia     fluttering  . Anginal pain     not had to use in awhile  none in 10 years  . CHF (congestive heart failure)   . Shortness of breath dyspnea     with exertion   . Diabetes mellitus without complication     borderline   . Anxiety   . Blood dyscrasia     trouble clotting   . Coronary artery disease     CABG 2005. s/p PTCA and stenting of the saphenous vein graft to PDA and saphenous vein graft to obtuse marginal by Dr. Burt Knack 09/29/11. Normal EF at cath 09/2011  . Sleep apnea     "used to"      could not use 2006  . Carotid artery disease     s/p L CEA 2009 (hx of evacuation of hematoma due to heparin)  . Lymphoma 12/21/2014    Past Surgical History  Procedure Laterality Date  . Evacuation of hematoma  03/2008    left neck; S/P endarterectomy; "cause heparin clotted it up"  . Coronary angioplasty with stent placement  09/29/11    "2"  . Carotid endarterectomy  03/2008    left  . Coronary artery bypass graft  2005    CABG X3  . Basal cell carcinoma excision  2000's    nose  . Percutaneous coronary stent intervention (pci-s) N/A 09/29/2011    Procedure: PERCUTANEOUS CORONARY STENT INTERVENTION (PCI-S);  Surgeon: Sherren Mocha, MD;  Location: [facility] CATH LAB;  Service: Cardiovascular;  Laterality: N/A;  . Mass excision Left 12/11/2014    Procedure: EXCISIONAL  BIOPSY OF LEFT SUPRA CLAVICULAR NECK MASS;  Surgeon: Jerrell Belfast, MD;  Location: [place];  Service: ENT;  Laterality: Left;  . Superficial lymph node biopsy / excision Left     Allergies: Heparin  Medications: Prior to Admission medications   Medication Sig Start Date End Date Taking? Authorizing Provider  albuterol (PROVENTIL) (2.5 MG/3ML) 0.083% nebulizer solution Take 3 mLs (2.5 mg total) by nebulization every 2 (two) hours as needed for wheezing. 12/12/14  Yes Hosie Poisson, MD  aspirin EC 81 MG tablet Take 81 mg by mouth daily.   Yes Historical Provider, MD  clonazePAM (KLONOPIN) 0.5 MG tablet TAKE 1 TABLET BY MOUTH TWICE A DAY AS NEEDED FOR ANXIETY or at hs for insomnia 12/16/14  Yes Aleksei Plotnikov V, MD  clopidogrel (PLAVIX) 75 MG tablet TAKE 1 TABLET BY MOUTH DAILY WITH BREAKFAST. 08/12/14  Yes Aleksei Plotnikov V, MD  metoprolol succinate (TOPROL-XL) 50 MG 24 hr tablet TAKE 2 TABLETS EVERY DAY 08/12/14  Yes Aleksei Plotnikov V, MD  potassium chloride SA (KLOR-CON M20) 20 MEQ tablet TAKE 1 TABLET BY MOUTH DAILY 08/12/14  Yes Aleksei Plotnikov V, MD  simvastatin (ZOCOR) 20 MG tablet TAKE 1 TABLET (  20 MG TOTAL) BY MOUTH AT BEDTIME. 09/15/14  Yes Aleksei Plotnikov V, MD  tiotropium (SPIRIVA) 18 MCG inhalation capsule Place 1 capsule (18 mcg total) into inhaler and inhale daily. 12/12/14  Yes Hosie Poisson, MD  torsemide (DEMADEX) 100 MG tablet Take 100 mg by mouth daily.   Yes Historical Provider, MD  traMADol (ULTRAM) 50 MG tablet TAKE 1 TO 2 TABLETS BY MOUTH TWICE A DAY AS NEEDED FOR PAIN 12/16/14  Yes Aleksei Plotnikov V, MD  Umeclidinium-Vilanterol (ANORO ELLIPTA) 62.5-25 MCG/INH AEPB INHALE 1 ACT INTO THE LUNGS DAILY 08/12/14  Yes Aleksei Plotnikov V, MD  naproxen sodium (ANAPROX) 220 MG tablet Take 1 tablet (220 mg total) by mouth as needed. 08/19/14   Aleksei Plotnikov V, MD  nitroGLYCERIN (NITROSTAT) 0.4 MG SL tablet Place 1 tablet (0.4 mg total) under the tongue every 5 (five) minutes as  needed for chest pain (Up to 3 doses). 01/06/14 09/25/15  [name], MD    Family History  Problem Relation Age of Onset  . Heart disease Father   . Bone cancer Mother     History   Social History  . Marital Status: Married    Spouse Name: N/A  . Number of Children: 1  . Years of Education: N/A   Occupational History  . cable installer    Social History Main Topics  . Smoking status: Current Every Day Smoker -- 2.00 packs/day for 41 years    Types: Cigarettes  . Smokeless tobacco: Former Systems developer    Types: Chew     Comment: consult entered  . Alcohol Use: Yes     Comment: 09/29/11 "quit years ago"  . Drug Use: Yes    Special: Marijuana     Comment: "last time was in the 1990's"  . Sexual Activity: Not Currently   Other Topics Concern  . None   Social History Narrative      Review of Systems  Constitutional: Negative for fever and chills.  Respiratory: Positive for wheezing.        Occasional cough and dyspnea with exertion  Cardiovascular: Positive for leg swelling. Negative for chest pain.  Gastrointestinal: Negative for nausea, vomiting, abdominal pain and blood in stool.  Genitourinary: Negative for dysuria and hematuria.  Musculoskeletal: Positive for back pain.  Neurological: Negative for headaches.    Vital Signs: Blood pressure 142/65, temperature 98.1, heart rate 58, respirations 20, oxygen saturation 92% room air  Physical Exam  Constitutional: He is oriented to person, place, and time. He appears well-developed and well-nourished.  Cardiovascular:  Slightly bradycardic, regular rhythm  Pulmonary/Chest: Effort normal.  Scattered wheezes  Abdominal: Bowel sounds are normal. There is no tenderness.  Obese  Musculoskeletal: Normal range of motion. He exhibits edema.  Lymphadenopathy:    He has cervical adenopathy.  Neurological: He is alert and oriented to person, place, and time.    Imaging: Nm Pet Image Initial (pi) Skull Base To  Thigh  12/25/2014   CLINICAL DATA:  Initial treatment strategy for lymphoma.  EXAM: NUCLEAR MEDICINE PET SKULL BASE TO THIGH  TECHNIQUE: 16.0 mCi F-18 FDG was injected intravenously. Full-ring PET imaging was performed from the skull base to thigh after the radiotracer. CT data was obtained and used for attenuation correction and anatomic localization.  FASTING BLOOD GLUCOSE:  Value: 121 mg/dl  COMPARISON:  06/24/2009  FINDINGS: NECK  The superficial left supraclavicular lymph node measuring 4.4 by 4.6 cm on image 40 of series 4 has maximum standard uptake value  12.2, [place]ville 5. There is a small amount of gas within this lesion suggesting recent biopsy.  Symmetric tonsillar activity. Small amount of activity superficial to the right submandibular gland is probably vascular. Probable sebaceous cyst along the right posterior lower neck, image 38 series 4, photopenic.  CHEST  Left upper prevascular lymph node measuring 1.5 cm in short axis on image 66 of series 4 has maximum standard uptake value 7.9, Deauville 4. A small right upper paratracheal lymph node measuring 0.9 cm in short axis on image 60 of series 4 has maximum standard uptake value 4.3, Deauville 3.  Calcified pleural plaques noted. Background vascular mediastinal activity approximately 3.6.  Other chest findings include coronary artery atherosclerosis, cardiomegaly, calcified pleural plaques (left greater than right), and bilateral airway thickening. Prior CABG.  ABDOMEN/PELVIS  Background hepatic activity 4.4. Portacaval lymph node measuring 1.6 cm in short axis on image 133 of series 4 has maximum standard uptake value 7.2, [place]ville 4. A a lymph node adjacent to the left adrenal gland measuring 1.9 cm in short axis on image 123 of series 4 has maximum standard uptake value 6.7, [place]ville 4. At the level of the left renal vessels, a left periaortic lymph node with maximum standard uptake value 7.1 measures 1.3 cm in short axis (image 136, series 4),  Deauville 4.  Bilateral hypermetabolic inguinal lymph nodes are present. A right inguinal lymph node measuring 1.3 cm in short axis on image 218 of series 4 has maximum standard uptake value 4.2, Deauville 3  Other abdominal findings include cholelithiasis, bilateral nonobstructive nephrolithiasis, and aortoiliac atherosclerosis.  SKELETON  Heterogeneous marrow activity noted but without definite focal bony lesion. Incidental note is made of right antecubital activity, maximum standard uptake value 8.6, probably injection related (this is the site of injection. ). Bridging spurring of both sacroiliac joints noted.  IMPRESSION: 1. Scattered hypermetabolic adenopathy in the neck, chest, abdomen, and inguinal regions, compatible with active lymphoma. 2. Airway thickening is present, suggesting bronchitis or reactive airways disease. 3. Scattered heterogeneous marrow activity without a focal lesion, uncertain significance. 4. Other findings include calcified pleural plaques left greater than right ; prior CABG; cardiomegaly; cholelithiasis; and bilateral nonobstructive nephrolithiasis.   Electronically Signed   By: Van Clines M.D.   On: 12/25/2014 09:09    Labs:  CBC:  Recent Labs  12/04/14 1427 12/21/14 1413 12/28/14 1210  WBC 10.8* 10.7* 11.0*  HGB 16.4 15.2 15.7  HCT 49.6 46.2 47.0  PLT 135* 140 157    COAGS:  Recent Labs  12/28/14 1210  INR 1.00  APTT 30    BMP:  Recent Labs  03/25/14 1631 09/25/14 1633 12/04/14 1427 12/21/14 1414  NA 139 139 141 140  K 4.3 4.4 4.3 4.1  CL 99 100 100  --   CO2 33* 37* 33* 31*  GLUCOSE 88 97 132* 103  BUN 14 13 16  15.0  CALCIUM 9.5 9.5 9.3 9.1  CREATININE 1.1 1.00 1.05 0.9  GFRNONAA  --   --  75*  --   GFRAA  --   --  87*  --     LIVER FUNCTION TESTS:  Recent Labs  12/21/14 1414  BILITOT 0.67  AST 21  ALT 28  ALKPHOS 83  PROT 7.3  ALBUMIN 3.9    TUMOR MARKERS: No results for input(s): AFPTM, CEA, CA199, CHROMGRNA in  the last 8760 hours.  Assessment and Plan: [name] is a 59 y.o. male with history of newly diagnosed follicular  lymphoma who presents today for Port-A-Cath placement for chemotherapy.Risks and benefits discussed with the patient/wife including, but not limited to bleeding, infection, pneumothorax, or fibrin sheath development and need for additional procedures. All of the patient's questions were answered, patient is agreeable to proceed. Consent signed and in chart.      Signed: D. Rowe Robert 12/28/2014, 12:44 PM  I spent a total of 20 minutes face to face in clinical consultation, greater than 50% of which was counseling/coordinating care for Port-A-Cath placement

## 2014-12-28 NOTE — Procedures (Signed)
Procedure:  Porta-cath placement Access:  Right IJ vein Findings:  SL Power Port placed with tip at cavoatrial junction.  Flushed and OK to use. Complications:  None EBL < 25 mL

## 2014-12-28 NOTE — Discharge Instructions (Signed)

## 2014-12-29 NOTE — Telephone Encounter (Signed)
Patient states that [facility] did not receive script for clonazepam on 5/4.  Is requesting to be resent.

## 2014-12-29 NOTE — Telephone Encounter (Signed)
Called [facility] to verify if they receive per [name] did not received fax gave refill authorization from 5/4. Notified pt refill has been call to pharmacy...[name]

## 2014-12-30 ENCOUNTER — Ambulatory Visit (HOSPITAL_BASED_OUTPATIENT_CLINIC_OR_DEPARTMENT_OTHER): Payer: BLUE CROSS/BLUE SHIELD | Admitting: Hematology and Oncology

## 2014-12-30 ENCOUNTER — Telehealth: Payer: Self-pay | Admitting: Hematology and Oncology

## 2014-12-30 ENCOUNTER — Encounter: Payer: Self-pay | Admitting: Hematology and Oncology

## 2014-12-30 VITALS — BP 134/47 | HR 60 | Temp 98.0°F | Resp 18 | Ht 72.0 in | Wt 347.9 lb

## 2014-12-30 DIAGNOSIS — C8230 Follicular lymphoma grade IIIa, unspecified site: Secondary | ICD-10-CM

## 2014-12-30 DIAGNOSIS — R799 Abnormal finding of blood chemistry, unspecified: Secondary | ICD-10-CM

## 2014-12-30 DIAGNOSIS — E790 Hyperuricemia without signs of inflammatory arthritis and tophaceous disease: Secondary | ICD-10-CM

## 2014-12-30 MED ORDER — ALLOPURINOL 300 MG PO TABS: 300.0000 mg | ORAL_TABLET | Freq: Every day | ORAL | Status: DC

## 2014-12-30 MED ORDER — ONDANSETRON HCL 8 MG PO TABS: 8.0000 mg | ORAL_TABLET | Freq: Three times a day (TID) | ORAL | Status: DC | PRN

## 2014-12-30 MED ORDER — ACYCLOVIR 400 MG PO TABS: 400.0000 mg | ORAL_TABLET | Freq: Every day | ORAL | Status: DC

## 2014-12-30 MED ORDER — PROCHLORPERAZINE MALEATE 10 MG PO TABS: 10.0000 mg | ORAL_TABLET | Freq: Four times a day (QID) | ORAL | Status: DC | PRN

## 2014-12-30 MED ORDER — LIDOCAINE-PRILOCAINE 2.5-2.5 % EX CREA: TOPICAL_CREAM | CUTANEOUS | Status: DC

## 2014-12-30 NOTE — Assessment & Plan Note (Signed)
He is at risk for tumor lysis. I recommend allopurinol and aggressive oral hydration

## 2014-12-30 NOTE — Progress Notes (Signed)
[facility] OFFICE PROGRESS NOTE  Patient Care Team: [name], MD as PCP - General (Internal Medicine) [name], MD as Consulting Physician (Cardiology)  SUMMARY OF ONCOLOGIC HISTORY: Oncology History   FLIPI score of 2; stage 3 and areas of involvement >4     Follicular lymphoma grade 3a   06/24/2009 Imaging PET CT scan showed two small FDG positive left neck nodes   12/11/2014 Surgery He underwent excisional lymph node biopsy of the left supraclavicular lymph node/neck region   12/11/2014 Pathology Results Accession: [id number] biopsy show follicular lymphoma   5/57/3220 Imaging ECHO showed LVH but preserved EF   12/25/2014 Imaging PET scan showed disease above and below diaphragm   12/28/2014 Procedure He has port placement    INTERVAL HISTORY: Please see below for problem oriented charting. He returns for further review. He complained of possible allergies to tape.  REVIEW OF SYSTEMS:   Constitutional: Denies fevers, chills or abnormal weight loss Eyes: Denies blurriness of vision Ears, nose, mouth, throat, and face: Denies mucositis or sore throat Respiratory: Denies cough, dyspnea or wheezes Cardiovascular: Denies palpitation, chest discomfort or lower extremity swelling Gastrointestinal:  Denies nausea, heartburn or change in bowel habits Skin: Denies abnormal skin rashes Lymphatics: Denies new lymphadenopathy or easy bruising Neurological:Denies numbness, tingling or new weaknesses Behavioral/Psych: Mood is stable, no new changes  All other systems were reviewed with the patient and are negative.  I have reviewed the past medical history, past surgical history, social history and family history with the patient and they are unchanged from previous note.  ALLERGIES:  is allergic to heparin.  MEDICATIONS:  Current Outpatient Prescriptions  Medication Sig Dispense Refill  . acyclovir (ZOVIRAX) 400 MG tablet Take 1 tablet (400 mg total) by  mouth daily. 30 tablet 3  . albuterol (PROVENTIL) (2.5 MG/3ML) 0.083% nebulizer solution Take 3 mLs (2.5 mg total) by nebulization every 2 (two) hours as needed for wheezing. 75 mL 2  . allopurinol (ZYLOPRIM) 300 MG tablet Take 1 tablet (300 mg total) by mouth daily. 30 tablet 3  . aspirin EC 81 MG tablet Take 81 mg by mouth daily.    . clonazePAM (KLONOPIN) 0.5 MG tablet TAKE 1 TABLET BY MOUTH TWICE A DAY AS NEEDED FOR ANXIETY or at hs for insomnia 180 tablet 0  . clopidogrel (PLAVIX) 75 MG tablet TAKE 1 TABLET BY MOUTH DAILY WITH BREAKFAST. 90 tablet 3  . lidocaine-prilocaine (EMLA) cream Apply to affected area once 30 g 3  . metoprolol succinate (TOPROL-XL) 50 MG 24 hr tablet TAKE 2 TABLETS EVERY DAY 180 tablet 3  . naproxen sodium (ANAPROX) 220 MG tablet Take 1 tablet (220 mg total) by mouth as needed. 60 tablet 2  . nitroGLYCERIN (NITROSTAT) 0.4 MG SL tablet Place 1 tablet (0.4 mg total) under the tongue every 5 (five) minutes as needed for chest pain (Up to 3 doses). 25 tablet 4  . ondansetron (ZOFRAN) 8 MG tablet Take 1 tablet (8 mg total) by mouth every 8 (eight) hours as needed. 30 tablet 1  . potassium chloride SA (KLOR-CON M20) 20 MEQ tablet TAKE 1 TABLET BY MOUTH DAILY 90 tablet 3  . prochlorperazine (COMPAZINE) 10 MG tablet Take 1 tablet (10 mg total) by mouth every 6 (six) hours as needed (Nausea or vomiting). 30 tablet 1  . simvastatin (ZOCOR) 20 MG tablet TAKE 1 TABLET (20 MG TOTAL) BY MOUTH AT BEDTIME. 90 tablet 3  . tiotropium (SPIRIVA) 18 MCG inhalation capsule  Place 1 capsule (18 mcg total) into inhaler and inhale daily. 30 capsule 2  . torsemide (DEMADEX) 100 MG tablet Take 100 mg by mouth daily.    . traMADol (ULTRAM) 50 MG tablet TAKE 1 TO 2 TABLETS BY MOUTH TWICE A DAY AS NEEDED FOR PAIN 360 tablet 0  . Umeclidinium-Vilanterol (ANORO ELLIPTA) 62.5-25 MCG/INH AEPB INHALE 1 ACT INTO THE LUNGS DAILY 180 each 3   No current facility-administered medications for this visit.     PHYSICAL EXAMINATION: ECOG PERFORMANCE STATUS: 0 - Asymptomatic  Filed Vitals:   12/30/14 1300  BP: 134/47  Pulse: 60  Temp: 98 F (36.7 C)  Resp: 18   Filed Weights   12/30/14 1300  Weight: 347 lb 14.4 oz (157.806 kg)    GENERAL:alert, no distress and comfortable SKIN:mild skin reaction to tapes EYES: normal, Conjunctiva are pink and non-injected, sclera clear OROPHARYNX:no exudate, no erythema and lips, buccal mucosa, and tongue normal  NECK: supple, thyroid normal size, non-tender, without nodularity LYMPH:  Persistent lymphadenopathy in the neck LUNGS: clear to auscultation and percussion with normal breathing effort HEART: regular rate & rhythm and no murmurs and no lower extremity edema ABDOMEN:abdomen soft, non-tender and normal bowel sounds Musculoskeletal:no cyanosis of digits and no clubbing. [place] site [place] NEURO: alert & oriented x 3 with fluent speech, no focal motor/sensory deficits  LABORATORY DATA:  I have reviewed the data as listed    Component Value Date/Time   NA 140 12/21/2014 1414   NA 141 12/04/2014 1427   K 4.1 12/21/2014 1414   K 4.3 12/04/2014 1427   CL 100 12/04/2014 1427   CO2 31* 12/21/2014 1414   CO2 33* 12/04/2014 1427   GLUCOSE 103 12/21/2014 1414   GLUCOSE 132* 12/04/2014 1427   BUN 15.0 12/21/2014 1414   BUN 16 12/04/2014 1427   CREATININE 0.9 12/21/2014 1414   CREATININE 1.05 12/04/2014 1427   CALCIUM 9.1 12/21/2014 1414   CALCIUM 9.3 12/04/2014 1427   PROT 7.3 12/21/2014 1414   PROT 7.7 02/25/2013 1056   ALBUMIN 3.9 12/21/2014 1414   ALBUMIN 4.1 02/25/2013 1056   AST 21 12/21/2014 1414   AST 21 02/25/2013 1056   ALT 28 12/21/2014 1414   ALT 24 02/25/2013 1056   ALKPHOS 83 12/21/2014 1414   ALKPHOS 85 02/25/2013 1056   BILITOT 0.67 12/21/2014 1414   BILITOT 0.7 02/25/2013 1056   GFRNONAA 75* 12/04/2014 1427   GFRAA 87* 12/04/2014 1427    No results found for: SPEP, UPEP  Lab Results  Component Value Date   WBC  11.0* 12/28/2014   NEUTROABS 8.1* 12/28/2014   HGB 15.7 12/28/2014   HCT 47.0 12/28/2014   MCV 97.3 12/28/2014   PLT 157 12/28/2014      Chemistry      Component Value Date/Time   NA 140 12/21/2014 1414   NA 141 12/04/2014 1427   K 4.1 12/21/2014 1414   K 4.3 12/04/2014 1427   CL 100 12/04/2014 1427   CO2 31* 12/21/2014 1414   CO2 33* 12/04/2014 1427   BUN 15.0 12/21/2014 1414   BUN 16 12/04/2014 1427   CREATININE 0.9 12/21/2014 1414   CREATININE 1.05 12/04/2014 1427      Component Value Date/Time   CALCIUM 9.1 12/21/2014 1414   CALCIUM 9.3 12/04/2014 1427   ALKPHOS 83 12/21/2014 1414   ALKPHOS 85 02/25/2013 1056   AST 21 12/21/2014 1414   AST 21 02/25/2013 1056   ALT 28 12/21/2014 1414  ALT 24 02/25/2013 1056   BILITOT 0.67 12/21/2014 1414   BILITOT 0.7 02/25/2013 1056       RADIOGRAPHIC STUDIES:I review PET scan images and ECHO report with him I have personally reviewed the radiological images as listed and agreed with the findings in the report.   ASSESSMENT & PLAN:  Follicular lymphoma grade 3a The decision was made based on publication in the Blood: Randomized trial of bendamustine-rituximab or R-CHOP/R-CVP in first-line treatment of indolent NHL or MCL: the BRIGHT study.  [place] [address], Lucianne Lei der [address] [place] BS, [address] M, Kwan YL, Simpson D, Craig M, Kolibaba K, Issa S, Altha, [place] DM, Munteanu M, Aram Beecham JM SO  Blood. 2014;123(19):2944.  The chemotherapy consists of   1. Bendamustine at 90 mg/m2 on day 1 & 2 2. Rituximab at 375 mg/m2 on day 1 Each cycle + 28 days  Plan for 6 cycles total with Neulasta support  In the international phase III BRIGHT trial, 447 previously untreated patients with advanced stage follicular (n = 811 patients), mantle cell (n = 74 patients), or other indolent lymphoma were randomly assigned to six cycles of BR according to the same dose and schedule described above or to R-CHOP or  R-CVP. BR resulted in similar complete (31 versus 25 percent) and overall (97 versus 91 percent) response rates. BR was associated with higher rates of vomiting and drug hypersensitivity and lower rates of peripheral neuropathy/paresthesia and alopecia. The use of prophylactic antiemetics was not specified in the protocol and was more common among patients assigned to R-CHOP.   We discussed the role of chemotherapy. The intent is for cure.  We discussed some of the risks, benefits and side-effects of Rituximab with Bendamustine.   Some of the short term side-effects included, though not limited to, risk of fatigue, weight loss, tumor lysis syndrome, risk of allergic reactions, pancytopenia, life-threatening infections, need for transfusions of blood products, nausea, vomiting, change in bowel habits, admission to hospital for various reasons, and risks of death.   Long term side-effects are also discussed including permanent damage to nerve function, chronic fatigue, and rare secondary malignancy including bone marrow disorders.   The patient is aware that the response rates discussed earlier is not guaranteed.    After a long discussion, patient made an informed decision to proceed with the prescribed plan of care.   Patient education material was dispensed      Asymptomatic hyperuricemia He is at risk for tumor lysis. I recommend allopurinol and aggressive oral hydration    Orders Placed This Encounter  Procedures  . CBC with Differential    Standing Status: Standing     Number of Occurrences: 20     Standing Expiration Date: 12/31/2015  . Comprehensive metabolic panel    Standing Status: Standing     Number of Occurrences: 20     Standing Expiration Date: 12/31/2015  . PHYSICIAN COMMUNICATION ORDER    Hepatitis B Virus screening with HBsAg and anti-HBc recommended prior to treatment with rituximab (Rituxan), ofatumumab (Arzerra) or obinutuzumab Dyann Kief).   All questions were  answered. The patient knows to call the clinic with any problems, questions or concerns. No barriers to learning was detected. I spent 30 minutes counseling the patient face to face. The total time spent in the appointment was 40 minutes and more than 50% was on counseling and review of test results     [facility], [place], MD 12/30/2014 8:30 PM

## 2014-12-30 NOTE — Telephone Encounter (Signed)
Gave and printed appt sched and avs for pt for JUNE °

## 2014-12-30 NOTE — Assessment & Plan Note (Signed)
The decision was made based on publication in the Blood: Randomized trial of bendamustine-rituximab or R-CHOP/R-CVP in first-line treatment of indolent NHL or MCL: the BRIGHT study.  [place] [address] Lucianne Lei der [address], [place] BS, [address] M, Kwan YL, Simpson D, Craig M, Kolibaba K, Issa S, [place], [place] DM, Munteanu M, Aram Beecham JM SO  Blood. 2014;123(19):2944.  The chemotherapy consists of   1. Bendamustine at 90 mg/m2 on day 1 & 2 2. Rituximab at 375 mg/m2 on day 1 Each cycle + 28 days  Plan for 6 cycles total with Neulasta support  In the international phase III BRIGHT trial, 447 previously untreated patients with advanced stage follicular (n = 440 patients), mantle cell (n = 74 patients), or other indolent lymphoma were randomly assigned to six cycles of BR according to the same dose and schedule described above or to R-CHOP or R-CVP. BR resulted in similar complete (31 versus 25 percent) and overall (97 versus 91 percent) response rates. BR was associated with higher rates of vomiting and drug hypersensitivity and lower rates of peripheral neuropathy/paresthesia and alopecia. The use of prophylactic antiemetics was not specified in the protocol and was more common among patients assigned to R-CHOP.   We discussed the role of chemotherapy. The intent is for cure.  We discussed some of the risks, benefits and side-effects of Rituximab with Bendamustine.   Some of the short term side-effects included, though not limited to, risk of fatigue, weight loss, tumor lysis syndrome, risk of allergic reactions, pancytopenia, life-threatening infections, need for transfusions of blood products, nausea, vomiting, change in bowel habits, admission to hospital for various reasons, and risks of death.   Long term side-effects are also discussed including permanent damage to nerve function, chronic fatigue, and rare secondary malignancy including bone marrow disorders.    The patient is aware that the response rates discussed earlier is not guaranteed.    After a long discussion, patient made an informed decision to proceed with the prescribed plan of care.   Patient education material was dispensed

## 2014-12-31 ENCOUNTER — Ambulatory Visit (HOSPITAL_BASED_OUTPATIENT_CLINIC_OR_DEPARTMENT_OTHER): Payer: BLUE CROSS/BLUE SHIELD

## 2014-12-31 ENCOUNTER — Ambulatory Visit (HOSPITAL_BASED_OUTPATIENT_CLINIC_OR_DEPARTMENT_OTHER): Payer: BLUE CROSS/BLUE SHIELD | Admitting: Nurse Practitioner

## 2014-12-31 ENCOUNTER — Other Ambulatory Visit: Payer: Self-pay | Admitting: *Deleted

## 2014-12-31 ENCOUNTER — Ambulatory Visit (HOSPITAL_COMMUNITY): Payer: BLUE CROSS/BLUE SHIELD

## 2014-12-31 VITALS — BP 137/55 | HR 68 | Temp 98.5°F | Resp 18

## 2014-12-31 DIAGNOSIS — T7840XA Allergy, unspecified, initial encounter: Secondary | ICD-10-CM | POA: Diagnosis not present

## 2014-12-31 DIAGNOSIS — Z5112 Encounter for antineoplastic immunotherapy: Secondary | ICD-10-CM

## 2014-12-31 DIAGNOSIS — C8230 Follicular lymphoma grade IIIa, unspecified site: Secondary | ICD-10-CM

## 2014-12-31 DIAGNOSIS — R062 Wheezing: Secondary | ICD-10-CM

## 2014-12-31 DIAGNOSIS — C8220 Follicular lymphoma grade III, unspecified, unspecified site: Secondary | ICD-10-CM | POA: Diagnosis not present

## 2014-12-31 MED ORDER — ACETAMINOPHEN 325 MG PO TABS
ORAL_TABLET | ORAL | Status: AC
  Filled 2014-12-31: qty 2

## 2014-12-31 MED ORDER — SODIUM CHLORIDE 0.9 % IV SOLN
Freq: Once | INTRAVENOUS | Status: AC
  Administered 2014-12-31: 10:00:00 via INTRAVENOUS

## 2014-12-31 MED ORDER — BENDAMUSTINE HCL CHEMO INJECTION 100 MG/4ML
90.0000 mg/m2 | Freq: Once | INTRAVENOUS | Status: AC
  Administered 2014-12-31: 250 mg via INTRAVENOUS
  Filled 2014-12-31: qty 10

## 2014-12-31 MED ORDER — SODIUM CHLORIDE 0.9 % IV SOLN
375.0000 mg/m2 | Freq: Once | INTRAVENOUS | Status: AC
  Administered 2014-12-31: 1100 mg via INTRAVENOUS
  Filled 2014-12-31: qty 110

## 2014-12-31 MED ORDER — SODIUM CHLORIDE 0.9 % IV SOLN
Freq: Once | INTRAVENOUS | Status: AC
  Administered 2014-12-31: 17:00:00 via INTRAVENOUS
  Filled 2014-12-31: qty 4

## 2014-12-31 MED ORDER — DIPHENHYDRAMINE HCL 25 MG PO CAPS
ORAL_CAPSULE | ORAL | Status: AC
  Filled 2014-12-31: qty 2

## 2014-12-31 MED ORDER — METHYLPREDNISOLONE SODIUM SUCC 125 MG IJ SOLR
125.0000 mg | Freq: Once | INTRAMUSCULAR | Status: AC | PRN
  Administered 2014-12-31: 125 mg via INTRAVENOUS

## 2014-12-31 MED ORDER — SODIUM CHLORIDE 0.9 % IJ SOLN
10.0000 mL | INTRAMUSCULAR | Status: DC | PRN
  Administered 2014-12-31: 10 mL
  Filled 2014-12-31: qty 10

## 2014-12-31 MED ORDER — ALBUTEROL SULFATE (2.5 MG/3ML) 0.083% IN NEBU
2.5000 mg | INHALATION_SOLUTION | Freq: Once | RESPIRATORY_TRACT | Status: AC
  Administered 2014-12-31: 2.5 mg via RESPIRATORY_TRACT
  Filled 2014-12-31: qty 3

## 2014-12-31 MED ORDER — HEPARIN SOD (PORK) LOCK FLUSH 100 UNIT/ML IV SOLN
500.0000 [IU] | Freq: Once | INTRAVENOUS | Status: DC | PRN
  Filled 2014-12-31: qty 5

## 2014-12-31 MED ORDER — ACETAMINOPHEN 325 MG PO TABS
650.0000 mg | ORAL_TABLET | Freq: Once | ORAL | Status: AC
  Administered 2014-12-31: 650 mg via ORAL

## 2014-12-31 MED ORDER — FAMOTIDINE IN NACL 20-0.9 MG/50ML-% IV SOLN
20.0000 mg | Freq: Once | INTRAVENOUS | Status: AC | PRN
  Administered 2014-12-31: 20 mg via INTRAVENOUS

## 2014-12-31 MED ORDER — DIPHENHYDRAMINE HCL 25 MG PO CAPS
50.0000 mg | ORAL_CAPSULE | Freq: Once | ORAL | Status: AC
  Administered 2014-12-31: 50 mg via ORAL

## 2014-12-31 NOTE — Progress Notes (Signed)
Patient stated that he was itching on his head and upper back. Red marks on face and upper chest was observed on patient. Rituximab stopped, vital signs taken, and normal saline started. Selena Lesser NP paged and arrived at patient chair side. Patient received 125 mg Solumedrol, 20 mg pepcid IV, and albuterol 2.5mg  treatment due to shortness of breath. [place] MD arrived to patient side to assess. Post albuterol treatment, patient in stable condition and rituximab restarted. (1320)

## 2014-12-31 NOTE — Progress Notes (Signed)
PAC flushed with saline only, pt allergic to heparin.  Pharmacy closed.

## 2014-12-31 NOTE — Patient Instructions (Addendum)
[facility] Discharge Instructions for Patients Receiving Chemotherapy  Today you received the following chemotherapy agents Rituxan/Treanda.  To help prevent nausea and vomiting after your treatment, we encourage you to take your nausea medication as directed.    If you develop nausea and vomiting that is not controlled by your nausea medication, call the clinic.   BELOW ARE SYMPTOMS THAT SHOULD BE REPORTED IMMEDIATELY:  *FEVER GREATER THAN 100.5 F  *CHILLS WITH OR WITHOUT FEVER  NAUSEA AND VOMITING THAT IS NOT CONTROLLED WITH YOUR NAUSEA MEDICATION  *UNUSUAL SHORTNESS OF BREATH  *UNUSUAL BRUISING OR BLEEDING  TENDERNESS IN MOUTH AND THROAT WITH OR WITHOUT PRESENCE OF ULCERS  *URINARY PROBLEMS  *BOWEL PROBLEMS  UNUSUAL RASH Items with * indicate a potential emergency and should be followed up as soon as possible.  Feel free to call the clinic you have any questions or concerns. The clinic phone number is [phone].  Please show the [facility] at check-in to the Emergency Department and triage nurse. Rituximab injection What is this medicine? RITUXIMAB (ri TUX i mab) is a monoclonal antibody. This medicine changes the way the body's immune system works. It is used commonly to treat non-Hodgkin's lymphoma and other conditions. In cancer cells, this drug targets a specific protein within cancer cells and stops the cancer cells from growing. It is also used to treat rhuematoid arthritis (RA). In RA, this medicine slow the inflammatory process and help reduce joint pain and swelling. This medicine is often used with other cancer or arthritis medications. This medicine may be used for other purposes; ask your health care provider or pharmacist if you have questions. COMMON BRAND NAME(S): Rituxan What should I tell my health care provider before I take this medicine? They need to know if you have any of these conditions: -blood disorders -heart  disease -history of hepatitis B -infection (especially a virus infection such as chickenpox, cold sores, or herpes) -irregular heartbeat -kidney disease -lung or breathing disease, like asthma -lupus -an unusual or allergic reaction to rituximab, mouse proteins, other medicines, foods, dyes, or preservatives -pregnant or trying to get pregnant -breast-feeding How should I use this medicine? This medicine is for infusion into a vein. It is administered in a hospital or clinic by a specially trained health care professional. A special MedGuide will be given to you by the pharmacist with each prescription and refill. Be sure to read this information carefully each time. Talk to your pediatrician regarding the use of this medicine in children. This medicine is not approved for use in children. Overdosage: If you think you have taken too much of this medicine contact a poison control center or emergency room at once. NOTE: This medicine is only for you. Do not share this medicine with others. What if I miss a dose? It is important not to miss a dose. Call your doctor or health care professional if you are unable to keep an appointment. What may interact with this medicine? -cisplatin -medicines for blood pressure -some other medicines for arthritis -vaccines This list may not describe all possible interactions. Give your health care provider a list of all the medicines, herbs, non-prescription drugs, or dietary supplements you use. Also tell them if you smoke, drink alcohol, or use illegal drugs. Some items may interact with your medicine. What should I watch for while using this medicine? Report any side effects that you notice during your treatment right away, such as changes in your breathing, fever, chills, dizziness  or lightheadedness. These effects are more common with the first dose. Visit your prescriber or health care professional for checks on your progress. You will need to have  regular blood work. Report any other side effects. The side effects of this medicine can continue after you finish your treatment. Continue your course of treatment even though you feel ill unless your doctor tells you to stop. Call your doctor or health care professional for advice if you get a fever, chills or sore throat, or other symptoms of a cold or flu. Do not treat yourself. This drug decreases your body's ability to fight infections. Try to avoid being around people who are sick. This medicine may increase your risk to bruise or bleed. Call your doctor or health care professional if you notice any unusual bleeding. Be careful brushing and flossing your teeth or using a toothpick because you may get an infection or bleed more easily. If you have any dental work done, tell your dentist you are receiving this medicine. Avoid taking products that contain aspirin, acetaminophen, ibuprofen, naproxen, or ketoprofen unless instructed by your doctor. These medicines may hide a fever. Do not become pregnant while taking this medicine. Women should inform their doctor if they wish to become pregnant or think they might be pregnant. There is a potential for serious side effects to an unborn child. Talk to your health care professional or pharmacist for more information. Do not breast-feed an infant while taking this medicine. What side effects may I notice from receiving this medicine? Side effects that you should report to your doctor or health care professional as soon as possible: -allergic reactions like skin rash, itching or hives, swelling of the face, lips, or tongue -low blood counts - this medicine may decrease the number of white blood cells, red blood cells and platelets. You may be at increased risk for infections and bleeding. -signs of infection - fever or chills, cough, sore throat, pain or difficulty passing urine -signs of decreased platelets or bleeding - bruising, pinpoint red spots on the  skin, black, tarry stools, blood in the urine -signs of decreased red blood cells - unusually weak or tired, fainting spells, lightheadedness -breathing problems -confused, not responsive -chest pain -fast, irregular heartbeat -feeling faint or lightheaded, falls -mouth sores -redness, blistering, peeling or loosening of the skin, including inside the mouth -stomach pain -swelling of the ankles, feet, or hands -trouble passing urine or change in the amount of urine Side effects that usually do not require medical attention (report to your doctor or other health care professional if they continue or are bothersome): -anxiety -headache -loss of appetite -muscle aches -nausea -night sweats This list may not describe all possible side effects. Call your doctor for medical advice about side effects. You may report side effects to FDA at 1-800-FDA-1088. Where should I keep my medicine? This drug is given in a hospital or clinic and will not be stored at home. NOTE: This sheet is a summary. It may not cover all possible information. If you have questions about this medicine, talk to your doctor, pharmacist, or health care provider.  2015, Elsevier/Gold Standard. (2008-03-30 14:04:59)  Bendamustine Injection What is this medicine? BENDAMUSTINE (BEN da MUS teen) is a chemotherapy drug. It is used to treat chronic lymphocytic leukemia and non-Hodgkin lymphoma. This medicine may be used for other purposes; ask your health care provider or pharmacist if you have questions. COMMON BRAND NAME(S): Treanda What should I tell my health care provider before  I take this medicine? They need to know if you have any of these conditions: -kidney disease -liver disease -an unusual or allergic reaction to bendamustine, mannitol, other medicines, foods, dyes, or preservatives -pregnant or trying to get pregnant -breast-feeding How should I use this medicine? This medicine is for infusion into a vein. It  is given by a health care professional in a hospital or clinic setting. Talk to your pediatrician regarding the use of this medicine in children. Special care may be needed. Overdosage: If you think you have taken too much of this medicine contact a poison control center or emergency room at once. NOTE: This medicine is only for you. Do not share this medicine with others. What if I miss a dose? It is important not to miss your dose. Call your doctor or health care professional if you are unable to keep an appointment. What may interact with this medicine? Do not take this medicine with any of the following medications: -clozapine This medicine may also interact with the following medications: -atazanavir -cimetidine -ciprofloxacin -enoxacin -fluvoxamine -medicines for seizures like carbamazepine and phenobarbital -mexiletine -rifampin -tacrine -thiabendazole -zileuton This list may not describe all possible interactions. Give your health care provider a list of all the medicines, herbs, non-prescription drugs, or dietary supplements you use. Also tell them if you smoke, drink alcohol, or use illegal drugs. Some items may interact with your medicine. What should I watch for while using this medicine? Your condition will be monitored carefully while you are receiving this medicine. This drug may make you feel generally unwell. This is not uncommon, as chemotherapy can affect healthy cells as well as cancer cells. Report any side effects. Continue your course of treatment even though you feel ill unless your doctor tells you to stop. Call your doctor or health care professional for advice if you get a fever, chills or sore throat, or other symptoms of a cold or flu. Do not treat yourself. This drug decreases your body's ability to fight infections. Try to avoid being around people who are sick. This medicine may increase your risk to bruise or bleed. Call your doctor or health care  professional if you notice any unusual bleeding. Be careful brushing and flossing your teeth or using a toothpick because you may get an infection or bleed more easily. If you have any dental work done, tell your dentist you are receiving this medicine. Avoid taking products that contain aspirin, acetaminophen, ibuprofen, naproxen, or ketoprofen unless instructed by your doctor. These medicines may hide a fever. Do not become pregnant while taking this medicine. Women should inform their doctor if they wish to become pregnant or think they might be pregnant. There is a potential for serious side effects to an unborn child. Men should inform their doctors if they wish to father a child. This medicine may lower sperm counts. Talk to your health care professional or pharmacist for more information. Do not breast-feed an infant while taking this medicine. What side effects may I notice from receiving this medicine? Side effects that you should report to your doctor or health care professional as soon as possible: -allergic reactions like skin rash, itching or hives, swelling of the face, lips, or tongue -low blood counts - this medicine may decrease the number of white blood cells, red blood cells and platelets. You may be at increased risk for infections and bleeding. -signs of infection - fever or chills, cough, sore throat, pain or difficulty passing urine -signs of decreased  platelets or bleeding - bruising, pinpoint red spots on the skin, black, tarry stools, blood in the urine -signs of decreased red blood cells - unusually weak or tired, fainting spells, lightheadedness -trouble passing urine or change in the amount of urine Side effects that usually do not require medical attention (report to your doctor or health care professional if they continue or are bothersome): -diarrhea This list may not describe all possible side effects. Call your doctor for medical advice about side effects. You may  report side effects to FDA at 1-800-FDA-1088. Where should I keep my medicine? This drug is given in a hospital or clinic and will not be stored at home. NOTE: This sheet is a summary. It may not cover all possible information. If you have questions about this medicine, talk to your doctor, pharmacist, or health care provider.  2015, [employer]vier/Gold Standard. (2011-10-27 14:15:47)

## 2015-01-01 ENCOUNTER — Ambulatory Visit (HOSPITAL_BASED_OUTPATIENT_CLINIC_OR_DEPARTMENT_OTHER): Payer: BLUE CROSS/BLUE SHIELD

## 2015-01-01 VITALS — BP 138/55 | HR 71 | Temp 98.1°F | Resp 16

## 2015-01-01 DIAGNOSIS — Z5112 Encounter for antineoplastic immunotherapy: Secondary | ICD-10-CM

## 2015-01-01 DIAGNOSIS — C8220 Follicular lymphoma grade III, unspecified, unspecified site: Secondary | ICD-10-CM

## 2015-01-01 DIAGNOSIS — C8230 Follicular lymphoma grade IIIa, unspecified site: Secondary | ICD-10-CM

## 2015-01-01 MED ORDER — SODIUM CHLORIDE 0.9 % IV SOLN
Freq: Once | INTRAVENOUS | Status: AC
  Administered 2015-01-01: 15:00:00 via INTRAVENOUS

## 2015-01-01 MED ORDER — SODIUM CHLORIDE 0.9 % IV SOLN
90.0000 mg/m2 | Freq: Once | INTRAVENOUS | Status: AC
  Administered 2015-01-01: 250 mg via INTRAVENOUS
  Filled 2015-01-01: qty 10

## 2015-01-01 MED ORDER — DEXAMETHASONE SODIUM PHOSPHATE 100 MG/10ML IJ SOLN
Freq: Once | INTRAMUSCULAR | Status: AC
  Administered 2015-01-01: 16:00:00 via INTRAVENOUS
  Filled 2015-01-01: qty 4

## 2015-01-01 MED ORDER — SODIUM CHLORIDE 0.9 % IJ SOLN
10.0000 mL | INTRAMUSCULAR | Status: DC | PRN
  Administered 2015-01-01: 10 mL
  Filled 2015-01-01: qty 10

## 2015-01-01 MED ORDER — ANTICOAGULANT SODIUM CITRATE 4% (200MG/5ML) IV SOLN
5.0000 mL | Freq: Once | Status: AC
  Administered 2015-01-01: 5 mL via INTRAVENOUS
  Filled 2015-01-01: qty 5

## 2015-01-01 NOTE — Patient Instructions (Signed)
[facility] Discharge Instructions for Patients Receiving Chemotherapy  Today you received the following chemotherapy agents treanda.  To help prevent nausea and vomiting after your treatment, we encourage you to take your nausea medication.   If you develop nausea and vomiting that is not controlled by your nausea medication, call the clinic.   BELOW ARE SYMPTOMS THAT SHOULD BE REPORTED IMMEDIATELY:  *FEVER GREATER THAN 100.5 F  *CHILLS WITH OR WITHOUT FEVER  NAUSEA AND VOMITING THAT IS NOT CONTROLLED WITH YOUR NAUSEA MEDICATION  *UNUSUAL SHORTNESS OF BREATH  *UNUSUAL BRUISING OR BLEEDING  TENDERNESS IN MOUTH AND THROAT WITH OR WITHOUT PRESENCE OF ULCERS  *URINARY PROBLEMS  *BOWEL PROBLEMS  UNUSUAL RASH Items with * indicate a potential emergency and should be followed up as soon as possible.  Feel free to call the clinic you have any questions or concerns. The clinic phone number is [phone].  Please show the [facility] at check-in to the Emergency Department and triage nurse.

## 2015-01-01 NOTE — Progress Notes (Signed)
Due to his young age, I felt that G-CSF may not be indicated. We will switch out heparin and substitute with sodium citrate for port flushes due to reported heparin allergy.

## 2015-01-01 NOTE — Progress Notes (Signed)
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  Discussed and VO per phone Dr. Thalia Bloodgood RN for sodium citrate.

## 2015-01-02 ENCOUNTER — Encounter: Payer: Self-pay | Admitting: Nurse Practitioner

## 2015-01-02 DIAGNOSIS — T7840XA Allergy, unspecified, initial encounter: Secondary | ICD-10-CM | POA: Insufficient documentation

## 2015-01-02 NOTE — Assessment & Plan Note (Signed)
Patient presented to the [facility] today to receive his first cycle of Rituxan/bendamustine chemotherapy regimen.  He was premedicated with both Tylenol and Benadryl 50 mg.  During the Rituxan portion of his infusion-patient developed a pruritic rash and some flushing to his face.  Also, patient has a history of chronic wheezing; but complained of increased wheezing as well.  On exam-patient did have significant wheezing to all lung fields.  He paragraph patient was given hypersensitivity protocol medication which consisted of Pepcid 20 mg inside Medrol 125 mg IV.  Patient was also given an albuterol nebulizer treatment which was resolved all wheezing.  Vital signs are main stable throughout.  Patient was able to complete all of his chemotherapy today with no further difficulties.

## 2015-01-02 NOTE — Assessment & Plan Note (Addendum)
Patient presented cancer [facility] today to receive his first cycle of rituximab/bendamustine chemotherapy regimen. Patient experienced a mild hypersensitivity reaction which consisted of a pruritic rash and some facial flushing.  He also experienced some increased wheezing as well.  All symptoms were managed per hypersensitivity protocol; and patient was able to complete all of his chemotherapy today.  Patient's plans to return tomorrow for cycle 1, day 2 of the same regimen.  Patient has plans to return on 01/28/2015 for labs, follow up visit, and his next chemotherapy.

## 2015-01-02 NOTE — Progress Notes (Signed)
SYMPTOM MANAGEMENT CLINIC   HPI: [name] 59 y.o. male diagnosed with follicular lymphoma.  Initiating cycle 1, day 1 of his Rituxan/Bendamustine chemotherapy regimen.  Patient presented to the [facility] today to receive his first cycle of Rituxan/bendamustine chemotherapy regimen.  He was premedicated with both Tylenol and Benadryl 50 mg.  During the Rituxan portion of his infusion-patient developed a pruritic rash and some flushing to his face.  Also, patient has a history of chronic wheezing; but complained of increased wheezing as well.  On exam-patient did have significant wheezing to all lung fields.  He paragraph patient was given hypersensitivity protocol medication which consisted of Pepcid 20 mg inside Medrol 125 mg IV.  Patient was also given an albuterol nebulizer treatment which was resolved all wheezing.  Vital signs are main stable throughout.  Patient was able to complete all of his chemotherapy today with no further difficulties.   HPI  ROS  Past Medical History  Diagnosis Date  . Heparin induced thrombocytopenia   . Hypertension   . Hyperlipidemia   . Obesity   . Edema   . COPD (chronic obstructive pulmonary disease)   . GERD (gastroesophageal reflux disease)   . Basal cell carcinoma of nose   . Chronic lower back pain   . Depression   . Myocardial infarction   . Heart murmur   . Dysrhythmia     fluttering  . Anginal pain     not had to use in awhile  none in 10 years  . CHF (congestive heart failure)   . Shortness of breath dyspnea     with exertion   . Diabetes mellitus without complication     borderline   . Anxiety   . Blood dyscrasia     trouble clotting   . Coronary artery disease     CABG 2005. s/p PTCA and stenting of the saphenous vein graft to PDA and saphenous vein graft to obtuse marginal by Dr. Burt Knack 09/29/11. Normal EF at cath 09/2011  . Sleep apnea     "used to"      could not use 2006  . Carotid artery disease     s/p L  CEA 2009 (hx of evacuation of hematoma due to heparin)  . Lymphoma 12/21/2014    Past Surgical History  Procedure Laterality Date  . Evacuation of hematoma  03/2008    left neck; S/P endarterectomy; "cause heparin clotted it up"  . Coronary angioplasty with stent placement  09/29/11    "2"  . Carotid endarterectomy  03/2008    left  . Coronary artery bypass graft  2005    CABG X3  . Basal cell carcinoma excision  2000's    nose  . Percutaneous coronary stent intervention (pci-s) N/A 09/29/2011    Procedure: PERCUTANEOUS CORONARY STENT INTERVENTION (PCI-S);  Surgeon: Sherren Mocha, MD;  Location: [facility] CATH LAB;  Service: Cardiovascular;  Laterality: N/A;  . Mass excision Left 12/11/2014    Procedure: EXCISIONAL BIOPSY OF LEFT SUPRA CLAVICULAR NECK MASS;  Surgeon: Jerrell Belfast, MD;  Location: [facility];  Service: ENT;  Laterality: Left;  . Superficial lymph node biopsy / excision Left     has Elevated lipids; OBESITY; HEPARIN-INDUCED THROMBOCYTOPENIA; Essential hypertension; Coronary Artery Disease ; C O P D; G E R D; Sleep apnea; Edema; ENLARGEMENT OF LYMPH NODES; Carotid disease, bilateral; Tobacco abuse; Murmur; Chronic venous insufficiency; Venous ulcer of left leg; Anxiety state, unspecified; Erectile dysfunction; Insomnia; Neck mass; Neoplasm of  uncertain behavior of skin; Mass of neck; Morbid obesity; Acute on chronic respiratory failure with hypoxia; Chronic diastolic heart failure; Diabetes mellitus type 2, controlled; Depression; OSA (obstructive sleep apnea); Hypertension; Coronary artery disease; Acute respiratory failure; Acute respiratory failure with hypoxia; Follicular lymphoma grade 3a; Asymptomatic hyperuricemia; and Hypersensitivity reaction on his problem list.    is allergic to heparin and tape.    Medication List       This list is accurate as of: 12/31/14 11:59 PM.  Always use your most recent med list.               acyclovir 400 MG tablet  Commonly known as:   ZOVIRAX  Take 1 tablet (400 mg total) by mouth daily.     albuterol (2.5 MG/3ML) 0.083% nebulizer solution  Commonly known as:  PROVENTIL  Take 3 mLs (2.5 mg total) by nebulization every 2 (two) hours as needed for wheezing.     allopurinol 300 MG tablet  Commonly known as:  ZYLOPRIM  Take 1 tablet (300 mg total) by mouth daily.     aspirin EC 81 MG tablet  Take 81 mg by mouth daily.     clonazePAM 0.5 MG tablet  Commonly known as:  KLONOPIN  TAKE 1 TABLET BY MOUTH TWICE A DAY AS NEEDED FOR ANXIETY or at hs for insomnia     clopidogrel 75 MG tablet  Commonly known as:  PLAVIX  TAKE 1 TABLET BY MOUTH DAILY WITH BREAKFAST.     lidocaine-prilocaine cream  Commonly known as:  EMLA  Apply to affected area once     metoprolol succinate 50 MG 24 hr tablet  Commonly known as:  TOPROL-XL  TAKE 2 TABLETS EVERY DAY     naproxen sodium 220 MG tablet  Commonly known as:  ANAPROX  Take 1 tablet (220 mg total) by mouth as needed.     nitroGLYCERIN 0.4 MG SL tablet  Commonly known as:  NITROSTAT  Place 1 tablet (0.4 mg total) under the tongue every 5 (five) minutes as needed for chest pain (Up to 3 doses).     ondansetron 8 MG tablet  Commonly known as:  ZOFRAN  Take 1 tablet (8 mg total) by mouth every 8 (eight) hours as needed.     potassium chloride SA 20 MEQ tablet  Commonly known as:  KLOR-CON M20  TAKE 1 TABLET BY MOUTH DAILY     prochlorperazine 10 MG tablet  Commonly known as:  COMPAZINE  Take 1 tablet (10 mg total) by mouth every 6 (six) hours as needed (Nausea or vomiting).     simvastatin 20 MG tablet  Commonly known as:  ZOCOR  TAKE 1 TABLET (20 MG TOTAL) BY MOUTH AT BEDTIME.     tiotropium 18 MCG inhalation capsule  Commonly known as:  SPIRIVA  Place 1 capsule (18 mcg total) into inhaler and inhale daily.     torsemide 100 MG tablet  Commonly known as:  DEMADEX  Take 100 mg by mouth daily.     traMADol 50 MG tablet  Commonly known as:  ULTRAM  TAKE 1 TO 2  TABLETS BY MOUTH TWICE A DAY AS NEEDED FOR PAIN     Umeclidinium-Vilanterol 62.5-25 MCG/INH Aepb  Commonly known as:  ANORO ELLIPTA  INHALE 1 ACT INTO THE LUNGS DAILY         PHYSICAL EXAMINATION  Oncology Vitals 01/01/2015 12/31/2014 12/31/2014 12/31/2014 12/31/2014 12/31/2014 12/31/2014  Height - - - - - - -  Weight - - - - - - -  Weight (lbs) - - - - - - -  BMI (kg/m2) - - - - - - -  Temp 98.1 98.5 98.6 98.6 98.4 97.3 98.3  Pulse 71 68 66 65 59 64 68  Resp _0 SpO2 95 94 98 92 - 94 95  BSA (m2) - - - - - - -   BP Readings from Last 3 Encounters:  01/01/15 138/55  12/31/14 137/55  12/30/14 134/47    Physical Exam  Constitutional: He is oriented to person, place, and time and well-developed, well-nourished, and in no distress.  HENT:  Head: Normocephalic and atraumatic.  Mouth/Throat: Oropharynx is clear and moist.  Eyes: Conjunctivae and EOM are normal. Pupils are equal, round, and reactive to light. Right eye exhibits no discharge. Left eye exhibits no discharge. No scleral icterus.  Neck: Normal range of motion. Neck supple. No JVD present. No tracheal deviation present. No thyromegaly present.  Cardiovascular: Normal rate, regular rhythm, normal heart sounds and intact distal pulses.   Pulmonary/Chest: Effort normal. No stridor. No respiratory distress. He has wheezes. He has no rales. He exhibits no tenderness.  All lung fields with wheezing.  No acute respiratory distress.  Abdominal: Soft. Bowel sounds are normal. He exhibits no distension and no mass. There is no tenderness. There is no rebound and no guarding.  Musculoskeletal: Normal range of motion. He exhibits edema. He exhibits no tenderness.  Large left neck/supraclavicular biopsy site healing well; no evidence of infection.  Lymphadenopathy:    He has no cervical adenopathy.  Neurological: He is alert and oriented to person, place, and time.  Skin: Skin is warm and dry. Rash noted. No erythema.  No pallor.  Facial flushing which eventually resolved completely.  Also, mild rash/hives to upper chest/neck area and upper back.  All rash resolved with hypersensitivity reaction medications.  Psychiatric: Affect normal.  Nursing note and vitals reviewed.   LABORATORY DATA:. No visits with results within 3 Day(s) from this visit. Latest known visit with results is:  Appointment on 12/21/2014  Component Date Value Ref Range Status  . WBC 12/21/2014 10.7* 4.0 - 10.3 10e3/uL Final  . NEUT# 12/21/2014 7.6* 1.5 - 6.5 10e3/uL Final  . HGB 12/21/2014 15.2  13.0 - 17.1 g/dL Final  . HCT 12/21/2014 46.2  38.4 - 49.9 % Final  . Platelets 12/21/2014 140  140 - 400 10e3/uL Final  . MCV 12/21/2014 98.5* 79.3 - 98.0 fL Final  . MCH 12/21/2014 32.4  27.2 - 33.4 pg Final  . MCHC 12/21/2014 32.9  32.0 - 36.0 g/dL Final  . RBC 12/21/2014 4.69  4.20 - 5.82 10e6/uL Final  . RDW 12/21/2014 13.0  11.0 - 14.6 % Final  . lymph# 12/21/2014 1.5  0.9 - 3.3 10e3/uL Final  . MONO# 12/21/2014 1.2* 0.1 - 0.9 10e3/uL Final  . Eosinophils Absolute 12/21/2014 0.4  0.0 - 0.5 10e3/uL Final  . Basophils Absolute 12/21/2014 0.1  0.0 - 0.1 10e3/uL Final  . NEUT% 12/21/2014 71.2  39.0 - 75.0 % Final  . LYMPH% 12/21/2014 14.1  14.0 - 49.0 % Final  . MONO% 12/21/2014 10.9  0.0 - 14.0 % Final  . EOS% 12/21/2014 3.3  0.0 - 7.0 % Final  . BASO% 12/21/2014 0.5  0.0 - 2.0 % Final  . Sodium 12/21/2014 140  136 - 145 mEq/L Final  . Potassium 12/21/2014 4.1  3.5 - 5.1 mEq/L Final  .  Chloride 12/21/2014 97* 98 - 109 mEq/L Final  . CO2 12/21/2014 31* 22 - 29 mEq/L Final  . Glucose 12/21/2014 103  70 - 140 mg/dl Final  . BUN 12/21/2014 15.0  7.0 - 26.0 mg/dL Final  . Creatinine 12/21/2014 0.9  0.7 - 1.3 mg/dL Final  . Total Bilirubin 12/21/2014 0.67  0.20 - 1.20 mg/dL Final  . Alkaline Phosphatase 12/21/2014 83  40 - 150 U/L Final  . AST 12/21/2014 21  5 - 34 U/L Final  . ALT 12/21/2014 28  0 - 55 U/L Final  . Total Protein  12/21/2014 7.3  6.4 - 8.3 g/dL Final  . Albumin 12/21/2014 3.9  3.5 - 5.0 g/dL Final  . Calcium 12/21/2014 9.1  8.4 - 10.4 mg/dL Final  . Anion Gap 12/21/2014 12* 3 - 11 mEq/L Final  . EGFR 12/21/2014 >90  >90 ml/min/1.73 m2 Final   eGFR is calculated using the CKD-EPI Creatinine Equation (2009)  . LDH 12/21/2014 186  125 - 245 U/L Final  . Uric Acid, Serum 12/21/2014 9.2* 2.6 - 7.4 mg/dl Final  . Hep B C IgM 12/21/2014 NON REACTIVE  NON REACTIVE Final   High levels of Hepatitis B Core IgM antibody are detectableduring the acute stage of Hepatitis B. This antibody is usedto differentiate current from past HBV infection.   . Hep B S Ab 12/21/2014 NEG  NEGATIVE Final  . Hepatitis B Surface Ag 12/21/2014 NEGATIVE  NEGATIVE Final     RADIOGRAPHIC STUDIES: No results found.  ASSESSMENT/PLAN:    Follicular lymphoma grade 3a Patient presented cancer Center today to receive his first cycle of rituximab/bendamustine chemotherapy regimen. Patient experienced a mild hypersensitivity reaction which consisted of a pruritic rash and some facial flushing.  He also experienced some increased wheezing as well.  All symptoms were managed per hypersensitivity protocol; and patient was able to complete all of his chemotherapy today.  Patient's plans to return tomorrow for cycle 1, day 2 of the same regimen.  Patient has plans to return on 01/28/2015 for labs, follow up visit, and his next chemotherapy.   Hypersensitivity reaction Patient presented to the [facility] today to receive his first cycle of Rituxan/bendamustine chemotherapy regimen.  He was premedicated with both Tylenol and Benadryl 50 mg.  During the Rituxan portion of his infusion-patient developed a pruritic rash and some flushing to his face.  Also, patient has a history of chronic wheezing; but complained of increased wheezing as well.  On exam-patient did have significant wheezing to all lung fields.  He paragraph patient was given  hypersensitivity protocol medication which consisted of Pepcid 20 mg inside Medrol 125 mg IV.  Patient was also given an albuterol nebulizer treatment which was resolved all wheezing.  Vital signs are main stable throughout.  Patient was able to complete all of his chemotherapy today with no further difficulties.   Patient stated understanding of all instructions; and was in agreement with this plan of care. The patient knows to call the clinic with any problems, questions or concerns.   Review/collaboration with Dr. Alvy Bimler regarding all aspects of patient's visit today.   Total time spent with patient was 40 minutes;  with greater than 75 percent of that time spent in face to face counseling regarding patient's symptoms,  and coordination of care and follow up.  Disclaimer: This note was dictated with voice recognition software. Similar sounding words Scott inadvertently be transcribed and may not be corrected upon review.   Drue Second, NP  01/02/2015

## 2015-01-04 ENCOUNTER — Telehealth: Payer: Self-pay | Admitting: *Deleted

## 2015-01-04 NOTE — Telephone Encounter (Signed)
TC to pt- pt is doing well. No complaints or concerns.

## 2015-01-12 ENCOUNTER — Encounter (HOSPITAL_COMMUNITY): Payer: Self-pay | Admitting: Emergency Medicine

## 2015-01-12 ENCOUNTER — Encounter: Payer: Self-pay | Admitting: Family

## 2015-01-12 ENCOUNTER — Ambulatory Visit (INDEPENDENT_AMBULATORY_CARE_PROVIDER_SITE_OTHER): Payer: BLUE CROSS/BLUE SHIELD | Admitting: Family

## 2015-01-12 ENCOUNTER — Inpatient Hospital Stay (HOSPITAL_COMMUNITY)
Admission: EM | Admit: 2015-01-12 | Discharge: 2015-01-19 | DRG: 602 | Disposition: A | Discharge status: Home / Self Care | Payer: BLUE CROSS/BLUE SHIELD | Attending: Internal Medicine | Admitting: Internal Medicine

## 2015-01-12 ENCOUNTER — Telehealth: Payer: Self-pay | Admitting: *Deleted

## 2015-01-12 VITALS — BP 100/68 | HR 78 | Temp 98.1°F | Resp 18 | Ht 72.0 in | Wt 350.8 lb

## 2015-01-12 DIAGNOSIS — T380X5A Adverse effect of glucocorticoids and synthetic analogues, initial encounter: Secondary | ICD-10-CM | POA: Diagnosis present

## 2015-01-12 DIAGNOSIS — Z8619 Personal history of other infectious and parasitic diseases: Secondary | ICD-10-CM | POA: Diagnosis not present

## 2015-01-12 DIAGNOSIS — D696 Thrombocytopenia, unspecified: Secondary | ICD-10-CM | POA: Diagnosis not present

## 2015-01-12 DIAGNOSIS — F17220 Nicotine dependence, chewing tobacco, uncomplicated: Secondary | ICD-10-CM | POA: Diagnosis present

## 2015-01-12 DIAGNOSIS — R509 Fever, unspecified: Secondary | ICD-10-CM | POA: Diagnosis present

## 2015-01-12 DIAGNOSIS — Z79899 Other long term (current) drug therapy: Secondary | ICD-10-CM

## 2015-01-12 DIAGNOSIS — T451X5A Adverse effect of antineoplastic and immunosuppressive drugs, initial encounter: Secondary | ICD-10-CM | POA: Diagnosis present

## 2015-01-12 DIAGNOSIS — I10 Essential (primary) hypertension: Secondary | ICD-10-CM | POA: Diagnosis present

## 2015-01-12 DIAGNOSIS — R600 Localized edema: Secondary | ICD-10-CM

## 2015-01-12 DIAGNOSIS — F1290 Cannabis use, unspecified, uncomplicated: Secondary | ICD-10-CM | POA: Diagnosis present

## 2015-01-12 DIAGNOSIS — I5033 Acute on chronic diastolic (congestive) heart failure: Secondary | ICD-10-CM | POA: Diagnosis present

## 2015-01-12 DIAGNOSIS — Z9221 Personal history of antineoplastic chemotherapy: Secondary | ICD-10-CM

## 2015-01-12 DIAGNOSIS — E119 Type 2 diabetes mellitus without complications: Secondary | ICD-10-CM | POA: Diagnosis present

## 2015-01-12 DIAGNOSIS — Z888 Allergy status to other drugs, medicaments and biological substances status: Secondary | ICD-10-CM | POA: Diagnosis not present

## 2015-01-12 DIAGNOSIS — Z808 Family history of malignant neoplasm of other organs or systems: Secondary | ICD-10-CM

## 2015-01-12 DIAGNOSIS — Z6841 Body Mass Index (BMI) 40.0 and over, adult: Secondary | ICD-10-CM

## 2015-01-12 DIAGNOSIS — G8929 Other chronic pain: Secondary | ICD-10-CM | POA: Diagnosis present

## 2015-01-12 DIAGNOSIS — M310 Hypersensitivity angiitis: Secondary | ICD-10-CM | POA: Diagnosis present

## 2015-01-12 DIAGNOSIS — Z79891 Long term (current) use of opiate analgesic: Secondary | ICD-10-CM

## 2015-01-12 DIAGNOSIS — D72829 Elevated white blood cell count, unspecified: Secondary | ICD-10-CM | POA: Diagnosis present

## 2015-01-12 DIAGNOSIS — K5900 Constipation, unspecified: Secondary | ICD-10-CM | POA: Diagnosis present

## 2015-01-12 DIAGNOSIS — M545 Low back pain: Secondary | ICD-10-CM | POA: Diagnosis present

## 2015-01-12 DIAGNOSIS — Z955 Presence of coronary angioplasty implant and graft: Secondary | ICD-10-CM | POA: Diagnosis not present

## 2015-01-12 DIAGNOSIS — L0390 Cellulitis, unspecified: Secondary | ICD-10-CM | POA: Diagnosis present

## 2015-01-12 DIAGNOSIS — F17210 Nicotine dependence, cigarettes, uncomplicated: Secondary | ICD-10-CM | POA: Diagnosis present

## 2015-01-12 DIAGNOSIS — G4733 Obstructive sleep apnea (adult) (pediatric): Secondary | ICD-10-CM | POA: Diagnosis present

## 2015-01-12 DIAGNOSIS — L03116 Cellulitis of left lower limb: Secondary | ICD-10-CM | POA: Diagnosis present

## 2015-01-12 DIAGNOSIS — Z9109 Other allergy status, other than to drugs and biological substances: Secondary | ICD-10-CM | POA: Diagnosis not present

## 2015-01-12 DIAGNOSIS — I252 Old myocardial infarction: Secondary | ICD-10-CM

## 2015-01-12 DIAGNOSIS — E785 Hyperlipidemia, unspecified: Secondary | ICD-10-CM | POA: Diagnosis present

## 2015-01-12 DIAGNOSIS — I878 Other specified disorders of veins: Secondary | ICD-10-CM | POA: Diagnosis present

## 2015-01-12 DIAGNOSIS — M109 Gout, unspecified: Secondary | ICD-10-CM | POA: Diagnosis present

## 2015-01-12 DIAGNOSIS — Z7902 Long term (current) use of antithrombotics/antiplatelets: Secondary | ICD-10-CM | POA: Diagnosis not present

## 2015-01-12 DIAGNOSIS — F419 Anxiety disorder, unspecified: Secondary | ICD-10-CM | POA: Diagnosis present

## 2015-01-12 DIAGNOSIS — C8290 Follicular lymphoma, unspecified, unspecified site: Secondary | ICD-10-CM | POA: Diagnosis not present

## 2015-01-12 DIAGNOSIS — R21 Rash and other nonspecific skin eruption: Secondary | ICD-10-CM | POA: Diagnosis present

## 2015-01-12 DIAGNOSIS — Z85828 Personal history of other malignant neoplasm of skin: Secondary | ICD-10-CM | POA: Diagnosis not present

## 2015-01-12 DIAGNOSIS — J42 Unspecified chronic bronchitis: Secondary | ICD-10-CM | POA: Diagnosis not present

## 2015-01-12 DIAGNOSIS — J449 Chronic obstructive pulmonary disease, unspecified: Secondary | ICD-10-CM | POA: Diagnosis present

## 2015-01-12 DIAGNOSIS — L539 Erythematous condition, unspecified: Secondary | ICD-10-CM | POA: Insufficient documentation

## 2015-01-12 DIAGNOSIS — I25700 Atherosclerosis of coronary artery bypass graft(s), unspecified, with unstable angina pectoris: Secondary | ICD-10-CM | POA: Diagnosis not present

## 2015-01-12 DIAGNOSIS — I5032 Chronic diastolic (congestive) heart failure: Secondary | ICD-10-CM | POA: Insufficient documentation

## 2015-01-12 DIAGNOSIS — K219 Gastro-esophageal reflux disease without esophagitis: Secondary | ICD-10-CM | POA: Diagnosis present

## 2015-01-12 DIAGNOSIS — Z8249 Family history of ischemic heart disease and other diseases of the circulatory system: Secondary | ICD-10-CM | POA: Diagnosis not present

## 2015-01-12 DIAGNOSIS — F329 Major depressive disorder, single episode, unspecified: Secondary | ICD-10-CM | POA: Diagnosis present

## 2015-01-12 DIAGNOSIS — L03119 Cellulitis of unspecified part of limb: Secondary | ICD-10-CM | POA: Diagnosis not present

## 2015-01-12 DIAGNOSIS — D692 Other nonthrombocytopenic purpura: Secondary | ICD-10-CM | POA: Insufficient documentation

## 2015-01-12 DIAGNOSIS — L03115 Cellulitis of right lower limb: Secondary | ICD-10-CM | POA: Diagnosis present

## 2015-01-12 DIAGNOSIS — D6959 Other secondary thrombocytopenia: Secondary | ICD-10-CM | POA: Diagnosis present

## 2015-01-12 DIAGNOSIS — L958 Other vasculitis limited to the skin: Secondary | ICD-10-CM | POA: Diagnosis not present

## 2015-01-12 DIAGNOSIS — E669 Obesity, unspecified: Secondary | ICD-10-CM | POA: Diagnosis present

## 2015-01-12 DIAGNOSIS — K5909 Other constipation: Secondary | ICD-10-CM

## 2015-01-12 DIAGNOSIS — A770 Spotted fever due to Rickettsia rickettsii: Secondary | ICD-10-CM | POA: Insufficient documentation

## 2015-01-12 DIAGNOSIS — I776 Arteritis, unspecified: Secondary | ICD-10-CM | POA: Diagnosis not present

## 2015-01-12 DIAGNOSIS — T887XXS Unspecified adverse effect of drug or medicament, sequela: Secondary | ICD-10-CM | POA: Diagnosis not present

## 2015-01-12 DIAGNOSIS — Z7982 Long term (current) use of aspirin: Secondary | ICD-10-CM | POA: Diagnosis not present

## 2015-01-12 DIAGNOSIS — I25110 Atherosclerotic heart disease of native coronary artery with unstable angina pectoris: Secondary | ICD-10-CM | POA: Diagnosis present

## 2015-01-12 DIAGNOSIS — R0600 Dyspnea, unspecified: Secondary | ICD-10-CM

## 2015-01-12 DIAGNOSIS — M7989 Other specified soft tissue disorders: Secondary | ICD-10-CM | POA: Diagnosis not present

## 2015-01-12 DIAGNOSIS — I509 Heart failure, unspecified: Secondary | ICD-10-CM

## 2015-01-12 DIAGNOSIS — C8230 Follicular lymphoma grade IIIa, unspecified site: Secondary | ICD-10-CM | POA: Diagnosis present

## 2015-01-12 DIAGNOSIS — Z951 Presence of aortocoronary bypass graft: Secondary | ICD-10-CM | POA: Diagnosis not present

## 2015-01-12 DIAGNOSIS — L299 Pruritus, unspecified: Secondary | ICD-10-CM | POA: Diagnosis not present

## 2015-01-12 DIAGNOSIS — T50905A Adverse effect of unspecified drugs, medicaments and biological substances, initial encounter: Secondary | ICD-10-CM | POA: Insufficient documentation

## 2015-01-12 DIAGNOSIS — M79609 Pain in unspecified limb: Secondary | ICD-10-CM | POA: Diagnosis not present

## 2015-01-12 LAB — BASIC METABOLIC PANEL
ANION GAP: 12 (ref 5–15)
BUN: 16 mg/dL (ref 6–20)
CO2: 33 mmol/L — ABNORMAL HIGH (ref 22–32)
CREATININE: 1.01 mg/dL (ref 0.61–1.24)
Calcium: 8.6 mg/dL — ABNORMAL LOW (ref 8.9–10.3)
Chloride: 93 mmol/L — ABNORMAL LOW (ref 101–111)
GFR calc Af Amer: >60 mL/min (ref >60)
Glucose, Bld: 118 mg/dL — ABNORMAL HIGH (ref 65–99)
Potassium: 3.5 mmol/L (ref 3.5–5.1)
Sodium: 138 mmol/L (ref 135–145)

## 2015-01-12 LAB — CBC
HCT: 45.3 % (ref 39.0–52.0)
Hemoglobin: 14.7 g/dL (ref 13.0–17.0)
MCH: 32.2 pg (ref 26.0–34.0)
MCHC: 32.5 g/dL (ref 30.0–36.0)
MCV: 99.3 fL (ref 78.0–100.0)
PLATELETS: 109 10*3/uL — AB (ref 150–400)
RBC: 4.56 MIL/uL (ref 4.22–5.81)
RDW: 13.6 % (ref 11.5–15.5)
WBC: 10.1 10*3/uL (ref 4.0–10.5)

## 2015-01-12 LAB — I-STAT CG4 LACTIC ACID, ED: LACTIC ACID, VENOUS: 2.03 mmol/L — AB (ref 0.5–2.0)

## 2015-01-12 MED ORDER — VANCOMYCIN HCL 10 G IV SOLR
2500.0000 mg | Freq: Once | INTRAVENOUS | Status: DC
  Filled 2015-01-12: qty 2500

## 2015-01-12 MED ORDER — CLOPIDOGREL BISULFATE 75 MG PO TABS
75.0000 mg | ORAL_TABLET | Freq: Every day | ORAL | Status: DC
  Administered 2015-01-13 – 2015-01-19 (×7): 75 mg via ORAL
  Filled 2015-01-12 (×9): qty 1

## 2015-01-12 MED ORDER — ASPIRIN EC 81 MG PO TBEC
81.0000 mg | DELAYED_RELEASE_TABLET | Freq: Every day | ORAL | Status: DC
  Administered 2015-01-12 – 2015-01-19 (×8): 81 mg via ORAL
  Filled 2015-01-12 (×8): qty 1

## 2015-01-12 MED ORDER — VANCOMYCIN HCL IN DEXTROSE 1-5 GM/200ML-% IV SOLN: 1000.0000 mg | Freq: Once | INTRAVENOUS | Status: DC

## 2015-01-12 MED ORDER — ACETAMINOPHEN 325 MG PO TABS: 650.0000 mg | ORAL_TABLET | Freq: Four times a day (QID) | ORAL | Status: DC | PRN

## 2015-01-12 MED ORDER — PIPERACILLIN-TAZOBACTAM 3.375 G IVPB
3.3750 g | Freq: Three times a day (TID) | INTRAVENOUS | Status: DC
  Administered 2015-01-13: 3.375 g via INTRAVENOUS
  Filled 2015-01-12 (×2): qty 50

## 2015-01-12 MED ORDER — CLONAZEPAM 0.5 MG PO TABS
0.5000 mg | ORAL_TABLET | Freq: Two times a day (BID) | ORAL | Status: DC | PRN
  Administered 2015-01-14: 0.5 mg via ORAL
  Filled 2015-01-12: qty 1

## 2015-01-12 MED ORDER — MORPHINE SULFATE 2 MG/ML IJ SOLN
1.0000 mg | INTRAMUSCULAR | Status: DC | PRN
  Administered 2015-01-12 – 2015-01-18 (×7): 1 mg via INTRAVENOUS
  Filled 2015-01-12 (×8): qty 1

## 2015-01-12 MED ORDER — ACETAMINOPHEN 650 MG RE SUPP: 650.0000 mg | Freq: Four times a day (QID) | RECTAL | Status: DC | PRN

## 2015-01-12 MED ORDER — VANCOMYCIN HCL 10 G IV SOLR
1250.0000 mg | Freq: Two times a day (BID) | INTRAVENOUS | Status: DC
  Administered 2015-01-13 – 2015-01-14 (×4): 1250 mg via INTRAVENOUS
  Filled 2015-01-12 (×5): qty 1250

## 2015-01-12 MED ORDER — SIMVASTATIN 20 MG PO TABS
20.0000 mg | ORAL_TABLET | Freq: Every day | ORAL | Status: DC
  Administered 2015-01-12 – 2015-01-18 (×7): 20 mg via ORAL
  Filled 2015-01-12 (×8): qty 1

## 2015-01-12 MED ORDER — PIPERACILLIN-TAZOBACTAM 3.375 G IVPB 30 MIN
3.3750 g | Freq: Once | INTRAVENOUS | Status: AC
  Administered 2015-01-12: 3.375 g via INTRAVENOUS
  Filled 2015-01-12: qty 50

## 2015-01-12 MED ORDER — FUROSEMIDE 10 MG/ML IJ SOLN
40.0000 mg | Freq: Two times a day (BID) | INTRAMUSCULAR | Status: DC
  Administered 2015-01-12 – 2015-01-17 (×10): 40 mg via INTRAVENOUS
  Filled 2015-01-12 (×13): qty 4

## 2015-01-12 MED ORDER — ALLOPURINOL 300 MG PO TABS
300.0000 mg | ORAL_TABLET | Freq: Every day | ORAL | Status: DC
  Administered 2015-01-12 – 2015-01-19 (×8): 300 mg via ORAL
  Filled 2015-01-12 (×8): qty 1

## 2015-01-12 MED ORDER — TRAMADOL HCL 50 MG PO TABS
50.0000 mg | ORAL_TABLET | Freq: Four times a day (QID) | ORAL | Status: DC | PRN
  Administered 2015-01-15 – 2015-01-19 (×8): 50 mg via ORAL
  Filled 2015-01-12 (×9): qty 1

## 2015-01-12 MED ORDER — ACYCLOVIR 400 MG PO TABS
400.0000 mg | ORAL_TABLET | Freq: Every day | ORAL | Status: DC
  Administered 2015-01-12 – 2015-01-19 (×8): 400 mg via ORAL
  Filled 2015-01-12 (×8): qty 1

## 2015-01-12 MED ORDER — ALBUTEROL SULFATE (2.5 MG/3ML) 0.083% IN NEBU
2.5000 mg | INHALATION_SOLUTION | RESPIRATORY_TRACT | Status: DC | PRN
  Administered 2015-01-18: 2.5 mg via RESPIRATORY_TRACT
  Filled 2015-01-12: qty 3

## 2015-01-12 MED ORDER — VANCOMYCIN HCL IN DEXTROSE 1-5 GM/200ML-% IV SOLN
1000.0000 mg | Freq: Once | INTRAVENOUS | Status: AC
  Administered 2015-01-12: 1000 mg via INTRAVENOUS
  Filled 2015-01-12: qty 200

## 2015-01-12 MED ORDER — VANCOMYCIN HCL 10 G IV SOLR
1500.0000 mg | Freq: Once | INTRAVENOUS | Status: AC
  Administered 2015-01-12: 1500 mg via INTRAVENOUS
  Filled 2015-01-12: qty 1500

## 2015-01-12 NOTE — Progress Notes (Signed)
Subjective:    Patient ID: [name], male    DOB: 10-27-1955, 59 y.o.   MRN: [id number]  Chief Complaint  Patient presents with  . Leg Swelling    legs swell for time to time but got bad on saturday, does wear compression socks, looks like there is a blister coming up, says legs turn blue, purple, or reddish color    HPI:  [name] is a 59 y.o. male with a PMH of chronic diastolic heart failure, hypertension, coronary artery disease, COPD, GERD, venous ulcer of left leg, and Type 2 diabetes who presents today for an office visit.  Associated symptoms of edema located in bilateral lower extremities has been going on for several years and over the weekend has gotten progressively worse. Indicates that he went camping over the weekend. Has had a rash on and off with some itching. Modifying factors include ace wraps which has helped some. Describes the legs as purple and blue with increased temperature. . Recently started chemotherapy and had a reaction to it with rash and chest tightness about 10 days ago. States he was started on an antibiotic at the time, but not able to be found. Denies shortness of breath or chest pain.    Allergies  Allergen Reactions  . Heparin Other (See Comments)    Opposite reaction Heparin induced thrombocytopenia  . Tape Rash    Current Outpatient Prescriptions on File Prior to Visit  Medication Sig Dispense Refill  . acyclovir (ZOVIRAX) 400 MG tablet Take 1 tablet (400 mg total) by mouth daily. 30 tablet 3  . albuterol (PROVENTIL) (2.5 MG/3ML) 0.083% nebulizer solution Take 3 mLs (2.5 mg total) by nebulization every 2 (two) hours as needed for wheezing. 75 mL 2  . allopurinol (ZYLOPRIM) 300 MG tablet Take 1 tablet (300 mg total) by mouth daily. 30 tablet 3  . aspirin EC 81 MG tablet Take 81 mg by mouth daily.    . clonazePAM (KLONOPIN) 0.5 MG tablet TAKE 1 TABLET BY MOUTH TWICE A DAY AS NEEDED FOR ANXIETY or at hs for insomnia 180 tablet 0  .  clopidogrel (PLAVIX) 75 MG tablet TAKE 1 TABLET BY MOUTH DAILY WITH BREAKFAST. 90 tablet 3  . lidocaine-prilocaine (EMLA) cream Apply to affected area once 30 g 3  . metoprolol succinate (TOPROL-XL) 50 MG 24 hr tablet TAKE 2 TABLETS EVERY DAY 180 tablet 3  . naproxen sodium (ANAPROX) 220 MG tablet Take 1 tablet (220 mg total) by mouth as needed. 60 tablet 2  . nitroGLYCERIN (NITROSTAT) 0.4 MG SL tablet Place 1 tablet (0.4 mg total) under the tongue every 5 (five) minutes as needed for chest pain (Up to 3 doses). 25 tablet 4  . ondansetron (ZOFRAN) 8 MG tablet Take 1 tablet (8 mg total) by mouth every 8 (eight) hours as needed. 30 tablet 1  . potassium chloride SA (KLOR-CON M20) 20 MEQ tablet TAKE 1 TABLET BY MOUTH DAILY 90 tablet 3  . prochlorperazine (COMPAZINE) 10 MG tablet Take 1 tablet (10 mg total) by mouth every 6 (six) hours as needed (Nausea or vomiting). 30 tablet 1  . simvastatin (ZOCOR) 20 MG tablet TAKE 1 TABLET (20 MG TOTAL) BY MOUTH AT BEDTIME. 90 tablet 3  . tiotropium (SPIRIVA) 18 MCG inhalation capsule Place 1 capsule (18 mcg total) into inhaler and inhale daily. 30 capsule 2  . torsemide (DEMADEX) 100 MG tablet Take 100 mg by mouth daily.    . traMADol (ULTRAM) 50 MG  tablet TAKE 1 TO 2 TABLETS BY MOUTH TWICE A DAY AS NEEDED FOR PAIN 360 tablet 0  . Umeclidinium-Vilanterol (ANORO ELLIPTA) 62.5-25 MCG/INH AEPB INHALE 1 ACT INTO THE LUNGS DAILY 180 each 3   No current facility-administered medications on file prior to visit.     Review of Systems  Constitutional: Positive for fever. Negative for chills.  Respiratory: Negative for chest tightness and shortness of breath.   Cardiovascular: Positive for leg swelling. Negative for chest pain and palpitations.  Skin: Positive for color change.      Objective:    BP 100/68 mmHg  Pulse 78  Temp(Src) 98.1 F (36.7 C) (Oral)  Resp 18  Ht 6' (1.829 m)  Wt 350 lb 12.8 oz (159.122 kg)  BMI 47.57 kg/m2  SpO2 95% Nursing note and  vital signs reviewed.  Physical Exam  Constitutional: He is oriented to person, place, and time. He appears well-developed and well-nourished. No distress.  Cardiovascular: Normal rate, regular rhythm, normal heart sounds and intact distal pulses.   Pulmonary/Chest: Effort normal and breath sounds normal.  Neurological: He is alert and oriented to person, place, and time.  Skin: Skin is warm and dry.  Bilateral 2+ pitting lower extremity edema. Legs appear purple and blue. Distal pulses are intact and appropriate. Tenderness elicited bilateral anterior lower extremities with no calf tenderness. Increased temperature noted bilaterally with right > left. No discharge noted.   Psychiatric: He has a normal mood and affect. His behavior is normal. Judgment and thought content normal.       Assessment & Plan:   Problem List Items Addressed This Visit      Other   Bilateral lower extremity edema - Primary    Symptoms and exam consistent with bilateral cellulitis, however cannot rule out blood clots although the likelihood is fairly low. Concern for increased risk of infection/sepsis given previous chemotherapy reaction and decreased immune function and ability to fight infection. Given fevers and bilateral extremity involvement, may require IV antibiotics. Refer to the emergency department for further management and follow-up. Patient in agreement with plan. Emergency room charge nurse notified of pending patient.

## 2015-01-12 NOTE — Progress Notes (Signed)
ANTIBIOTIC CONSULT NOTE - INITIAL  Pharmacy Consult for Vancomycin & Zosyn Indication: Cellulitis  Allergies  Allergen Reactions  . Heparin Other (See Comments)    Opposite reaction Heparin induced thrombocytopenia  . Tape Rash    Patient Measurements: Height: 6' (182.9 cm) Weight: (!) 349 lb (158.305 kg) IBW/kg (Calculated) : 77.6  Vital Signs: Temp: 99.3 F (37.4 C) (05/31 1820) Temp Source: Oral (05/31 1820) BP: 123/53 mmHg (05/31 1820) Pulse Rate: 79 (05/31 1820) Intake/Output from previous day:   Intake/Output from this shift:    Labs:  Recent Labs  01/12/15 1355  WBC 10.1  HGB 14.7  PLT 109*  CREATININE 1.01   Estimated Creatinine Clearance: 120.9 mL/min (by C-G formula based on Cr of 1.01). No results for input(s): VANCOTROUGH, VANCOPEAK, VANCORANDOM, GENTTROUGH, GENTPEAK, GENTRANDOM, TOBRATROUGH, TOBRAPEAK, TOBRARND, AMIKACINPEAK, AMIKACINTROU, AMIKACIN in the last 72 hours.   Microbiology: No results found for this or any previous visit (from the past 720 hour(s)).  Medical History: Past Medical History  Diagnosis Date  . Heparin induced thrombocytopenia   . Hypertension   . Hyperlipidemia   . Obesity   . Edema   . COPD (chronic obstructive pulmonary disease)   . GERD (gastroesophageal reflux disease)   . Basal cell carcinoma of nose   . Chronic lower back pain   . Depression   . Myocardial infarction   . Heart murmur   . Dysrhythmia     fluttering  . Anginal pain     not had to use in awhile  none in 10 years  . CHF (congestive heart failure)   . Shortness of breath dyspnea     with exertion   . Diabetes mellitus without complication     borderline   . Anxiety   . Blood dyscrasia     trouble clotting   . Coronary artery disease     CABG 2005. s/p PTCA and stenting of the saphenous vein graft to PDA and saphenous vein graft to obtuse marginal by Dr. Burt Knack 09/29/11. Normal EF at cath 09/2011  . Sleep apnea     "used to"      could not  use 2006  . Carotid artery disease     s/p L CEA 2009 (hx of evacuation of hematoma due to heparin)  . Lymphoma 12/21/2014    Medications:  Scheduled:  . piperacillin-tazobactam  3.375 g Intravenous Once  . vancomycin  1,500 mg Intravenous Once   Infusions:   Assessment:  59 yr male with h/o chronic venous insufficiency, lymphoma, HTN sent from PCP to be treated with IV antibiotics for bilater lower extremity cellulitis  Patient received Vancomycin 1gm x 1 in ED @ 16:45  Pharmacy consulted to dose Vancomycin and Zosyn for treatment of cellulitis  CrCl (n) = 79 ml/min  Blood cultures ordered  Goal of Therapy:  Vancomycin trough level 10-15 mcg/ml  Plan:  Measure antibiotic drug levels at steady state Follow up culture results  Zosyn 3.375gm IV q8h (each dose infused over 4 hrs) Give additional Vancomycin 1500mg  IV x 1 now (total loading dose of 2500mg ) followed by Vanc 1250mg  IV q12h  Abrish Erny, Toribio Harbour, PharmD 01/12/2015,7:11 PM

## 2015-01-12 NOTE — Telephone Encounter (Signed)
TC from pt who states his legs have been bothering him quite a lot over the weekend-with significant edema to both legs to knees, turning red, and even dark purple/black. C/o pain and itching with this-states "they look like they are about to bust".  Pt with h/o heart disease, chronic venous insufficiency, hypertension etc. (as well as the lymphoma Dr. Alvy Bimler is treating him for).  Denies rash on back, face. States he has had a headache every day for several days. Does not know his BP. Instructed to go to ED for evaluation or at the very least call his PCP. Advised that if he came here to the symptom management clinic we would likely send him to the ED.  Pt verbalized understanding and states he will call Dr. Alain Marion.

## 2015-01-12 NOTE — Progress Notes (Signed)
Utilization Review completed.  Shyanne Mcclary RN CM  

## 2015-01-12 NOTE — Assessment & Plan Note (Signed)
Symptoms and exam consistent with bilateral cellulitis, however cannot rule out blood clots although the likelihood is fairly low. Concern for increased risk of infection/sepsis given previous chemotherapy reaction and decreased immune function and ability to fight infection. Given fevers and bilateral extremity involvement, may require IV antibiotics. Refer to the emergency department for further management and follow-up. Patient in agreement with plan. Emergency room charge nurse notified of pending patient.

## 2015-01-12 NOTE — ED Provider Notes (Signed)
CSN: [id number]     Arrival date & time 01/12/15  1312 History   First MD Initiated Contact with Patient 01/12/15 1549     Chief Complaint  Patient presents with  . Leg Swelling     (Consider location/radiation/quality/duration/timing/severity/associated sxs/prior Treatment) HPI Comments: Pt here with bilateral le edema x 3 days with associated low grade to 100.5. Saw his pcp today and dx with cellulitis and sent here for tx  denies emesis, diarrhea, productive cough, or urinary sx  Currently being tx for lyphoma and last chemo 10 days ago  Pt denies chf sx or chest pain  Current sx have been progressively worse and nothing makes them better, no tx used for this pta  The history is provided by the patient.    Past Medical History  Diagnosis Date  . Heparin induced thrombocytopenia   . Hypertension   . Hyperlipidemia   . Obesity   . Edema   . COPD (chronic obstructive pulmonary disease)   . GERD (gastroesophageal reflux disease)   . Basal cell carcinoma of nose   . Chronic lower back pain   . Depression   . Myocardial infarction   . Heart murmur   . Dysrhythmia     fluttering  . Anginal pain     not had to use in awhile  none in 10 years  . CHF (congestive heart failure)   . Shortness of breath dyspnea     with exertion   . Diabetes mellitus without complication     borderline   . Anxiety   . Blood dyscrasia     trouble clotting   . Coronary artery disease     CABG 2005. s/p PTCA and stenting of the saphenous vein graft to PDA and saphenous vein graft to obtuse marginal by Dr. Burt Knack 09/29/11. Normal EF at cath 09/2011  . Sleep apnea     "used to"      could not use 2006  . Carotid artery disease     s/p L CEA 2009 (hx of evacuation of hematoma due to heparin)  . Lymphoma 12/21/2014   Past Surgical History  Procedure Laterality Date  . Evacuation of hematoma  03/2008    left neck; S/P endarterectomy; "cause heparin clotted it up"  . Coronary angioplasty with  stent placement  09/29/11    "2"  . Carotid endarterectomy  03/2008    left  . Coronary artery bypass graft  2005    CABG X3  . Basal cell carcinoma excision  2000's    nose  . Percutaneous coronary stent intervention (pci-s) N/A 09/29/2011    Procedure: PERCUTANEOUS CORONARY STENT INTERVENTION (PCI-S);  Surgeon: Sherren Mocha, MD;  Location: [facility] CATH LAB;  Service: Cardiovascular;  Laterality: N/A;  . Mass excision Left 12/11/2014    Procedure: EXCISIONAL BIOPSY OF LEFT SUPRA CLAVICULAR NECK MASS;  Surgeon: Jerrell Belfast, MD;  Location: [place];  Service: ENT;  Laterality: Left;  . Superficial lymph node biopsy / excision Left    Family History  Problem Relation Age of Onset  . Heart disease Father   . Bone cancer Mother    History  Substance Use Topics  . Smoking status: Current Every Day Smoker -- 2.00 packs/day for 41 years    Types: Cigarettes  . Smokeless tobacco: Former Systems developer    Types: Chew     Comment: consult entered  . Alcohol Use: Yes     Comment: 09/29/11 "quit years ago"    Review  of Systems  All other systems reviewed and are negative.     Allergies  Heparin and Tape  Home Medications   Prior to Admission medications   Medication Sig Start Date End Date Taking? Authorizing Provider  acyclovir (ZOVIRAX) 400 MG tablet Take 1 tablet (400 mg total) by mouth daily. 12/30/14   Heath Lark, MD  albuterol (PROVENTIL) (2.5 MG/3ML) 0.083% nebulizer solution Take 3 mLs (2.5 mg total) by nebulization every 2 (two) hours as needed for wheezing. 12/12/14   Hosie Poisson, MD  allopurinol (ZYLOPRIM) 300 MG tablet Take 1 tablet (300 mg total) by mouth daily. 12/30/14   Heath Lark, MD  aspirin EC 81 MG tablet Take 81 mg by mouth daily.    Historical Provider, MD  clonazePAM (KLONOPIN) 0.5 MG tablet TAKE 1 TABLET BY MOUTH TWICE A DAY AS NEEDED FOR ANXIETY or at hs for insomnia 12/16/14   Lew Dawes V, MD  clopidogrel (PLAVIX) 75 MG tablet TAKE 1 TABLET BY MOUTH DAILY WITH  BREAKFAST. 08/12/14   Aleksei Plotnikov V, MD  lidocaine-prilocaine (EMLA) cream Apply to affected area once 12/30/14   Heath Lark, MD  metoprolol succinate (TOPROL-XL) 50 MG 24 hr tablet TAKE 2 TABLETS EVERY DAY 08/12/14   Aleksei Plotnikov V, MD  naproxen sodium (ANAPROX) 220 MG tablet Take 1 tablet (220 mg total) by mouth as needed. 08/19/14   Aleksei Plotnikov V, MD  nitroGLYCERIN (NITROSTAT) 0.4 MG SL tablet Place 1 tablet (0.4 mg total) under the tongue every 5 (five) minutes as needed for chest pain (Up to 3 doses). 01/06/14 09/25/15  [name], MD  ondansetron (ZOFRAN) 8 MG tablet Take 1 tablet (8 mg total) by mouth every 8 (eight) hours as needed. 12/30/14   Heath Lark, MD  potassium chloride SA (KLOR-CON M20) 20 MEQ tablet TAKE 1 TABLET BY MOUTH DAILY 08/12/14   Aleksei Plotnikov V, MD  prochlorperazine (COMPAZINE) 10 MG tablet Take 1 tablet (10 mg total) by mouth every 6 (six) hours as needed (Nausea or vomiting). 12/30/14   Heath Lark, MD  simvastatin (ZOCOR) 20 MG tablet TAKE 1 TABLET (20 MG TOTAL) BY MOUTH AT BEDTIME. 09/15/14   Aleksei Plotnikov V, MD  tiotropium (SPIRIVA) 18 MCG inhalation capsule Place 1 capsule (18 mcg total) into inhaler and inhale daily. 12/12/14   Hosie Poisson, MD  torsemide (DEMADEX) 100 MG tablet Take 100 mg by mouth daily.    Historical Provider, MD  traMADol (ULTRAM) 50 MG tablet TAKE 1 TO 2 TABLETS BY MOUTH TWICE A DAY AS NEEDED FOR PAIN 12/16/14   [name], MD  Umeclidinium-Vilanterol (ANORO ELLIPTA) 62.5-25 MCG/INH AEPB INHALE 1 ACT INTO THE LUNGS DAILY 08/12/14   Aleksei Plotnikov V, MD   BP 114/45 mmHg  Pulse 77  Temp(Src) 99 F (37.2 C) (Oral)  Resp 18  SpO2 92% Physical Exam  Constitutional: He is oriented to person, place, and time. He appears well-developed and well-nourished.  Non-toxic appearance. No distress.  HENT:  Head: Normocephalic and atraumatic.  Eyes: Conjunctivae, EOM and lids are normal. Pupils are equal, round, and reactive  to light.  Neck: Normal range of motion. Neck supple. No tracheal deviation present. No thyroid mass present.  Cardiovascular: Normal rate, regular rhythm and normal heart sounds.  Exam reveals no gallop.   No murmur heard. Pulmonary/Chest: Effort normal and breath sounds normal. No stridor. No respiratory distress. He has no decreased breath sounds. He has no wheezes. He has no rhonchi. He has no rales.  Abdominal: Soft. Normal appearance and bowel sounds are normal. He exhibits no distension. There is no tenderness. There is no rebound and no CVA tenderness.  Musculoskeletal: Normal range of motion. He exhibits no edema or tenderness.  See attached photo, bilateral 3 plus le edema with erythema and ecchymosis  Neurological: He is alert and oriented to person, place, and time. He has normal strength. No cranial nerve deficit or sensory deficit. GCS eye subscore is 4. GCS verbal subscore is 5. GCS motor subscore is 6.  Skin: Skin is warm and dry. No abrasion and no rash noted.  Psychiatric: He has a normal mood and affect. His speech is normal and behavior is normal.  Nursing note and vitals reviewed.   ED Course  Procedures (including critical care time) Labs Review Labs Reviewed  BASIC METABOLIC PANEL - Abnormal; Notable for the following:    Chloride 93 (*)    CO2 33 (*)    Glucose, Bld 118 (*)    Calcium 8.6 (*)    All other components within normal limits  CBC - Abnormal; Notable for the following:    Platelets 109 (*)    All other components within normal limits  CULTURE, BLOOD (ROUTINE X 2)  CULTURE, BLOOD (ROUTINE X 2)  I-STAT CG4 LACTIC ACID, ED    Imaging Review No results found.   EKG Interpretation None      MDM   Final diagnoses:  None       Patient started on vancomycin and will be admitted to the medicine service  Lacretia Leigh, MD 01/12/15 [phone]

## 2015-01-12 NOTE — Progress Notes (Signed)
Pre visit review using our clinic review tool, if applicable. No additional management support is needed unless otherwise documented below in the visit note. 

## 2015-01-12 NOTE — Patient Instructions (Signed)
Thank you for choosing [facility].  Summary/Instructions:  Recommendation for you to go to the Emergency Room for further evaluation and possible IV antibiotics.   Cellulitis Cellulitis is an infection of the skin and the tissue beneath it. The infected area is usually red and tender. Cellulitis occurs most often in the arms and lower legs.  CAUSES  Cellulitis is caused by bacteria that enter the skin through cracks or cuts in the skin. The most common types of bacteria that cause cellulitis are staphylococci and streptococci. SIGNS AND SYMPTOMS   Redness and warmth.  Swelling.  Tenderness or pain.  Fever. DIAGNOSIS  Your health care provider can usually determine what is wrong based on a physical exam. Blood tests may also be done. TREATMENT  Treatment usually involves taking an antibiotic medicine. HOME CARE INSTRUCTIONS   Take your antibiotic medicine as directed by your health care provider. Finish the antibiotic even if you start to feel better.  Keep the infected arm or leg elevated to reduce swelling.  Apply a warm cloth to the affected area up to 4 times per day to relieve pain.  Take medicines only as directed by your health care provider.  Keep all follow-up visits as directed by your health care provider. SEEK MEDICAL CARE IF:   You notice red streaks coming from the infected area.  Your red area gets larger or turns dark in color.  Your bone or joint underneath the infected area becomes painful after the skin has healed.  Your infection returns in the same area or another area.  You notice a swollen bump in the infected area.  You develop new symptoms.  You have a fever. SEEK IMMEDIATE MEDICAL CARE IF:   You feel very sleepy.  You develop vomiting or diarrhea.  You have a general ill feeling (malaise) with muscle aches and pains. MAKE SURE YOU:   Understand these instructions.  Will watch your condition.  Will get help right away if  you are not doing well or get worse. Document Released: 05/10/2005 Document Revised: 12/15/2013 Document Reviewed: 10/16/2011 [facility] Patient Information 2015 [place], [place]. This information is not intended to replace advice given to you by your health care provider. Make sure you discuss any questions you have with your health care provider.

## 2015-01-12 NOTE — H&P (Signed)
[facility] History and Physical  IRAM LUNDBERG TKZ:[id number] DOB: [birth year] DOA: 01/12/2015  Referring physician: EDP PCP: Walker Kehr, MD   Chief Complaint: lower extremity redness and swelling since Saturday.   HPI: [name] is a 59 y.o. male  With h/o lymphoma , last chemo 10days ago, comes in for fever and was found to have lower extremity redness, swelling and tenderness since Saturday. He reports smoking and no relief on taking demadex for swelling. His labs revealed normal white count, mild thrombocytopenia and elevated lactic acid.  He will be admitted to medical service for management of cellulitis.    Review of Systems:  Constitutional:  No weight loss, night sweats, chills, fatigue.  HEENT:  No headaches, Difficulty swallowing,Tooth/dental problems,Sore throat,  No sneezing, itching, ear ache, nasal congestion, post nasal drip,  Cardio-vascular:  No chest pain, Orthopnea, PND, swelling in lower extremities, anasarca, dizziness, palpitations  GI:  No heartburn, indigestion, abdominal pain, nausea, vomiting, diarrhea, change in bowel habits, loss of appetite  Resp:  No shortness of breath with exertion or at rest. No excess mucus, no productive cough, No non-productive cough, No coughing up of blood.No change in color of mucus.No wheezing.No chest wall deformity  Skin:  no rash or lesions.  GU:  no dysuria, change in color of urine, no urgency or frequency. No flank pain.  Musculoskeletal:  No joint pain or swelling. No decreased range of motion. No back pain. Lower extremity edema and redness since Saturday.  Psych:  No change in mood or affect. No depression or anxiety. No memory loss.   Past Medical History  Diagnosis Date  . Heparin induced thrombocytopenia   . Hypertension   . Hyperlipidemia   . Obesity   . Edema   . COPD (chronic obstructive pulmonary disease)   . GERD (gastroesophageal reflux disease)   . Basal cell carcinoma of nose     . Chronic lower back pain   . Depression   . Myocardial infarction   . Heart murmur   . Dysrhythmia     fluttering  . Anginal pain     not had to use in awhile  none in 10 years  . CHF (congestive heart failure)   . Shortness of breath dyspnea     with exertion   . Diabetes mellitus without complication     borderline   . Anxiety   . Blood dyscrasia     trouble clotting   . Coronary artery disease     CABG 2005. s/p PTCA and stenting of the saphenous vein graft to PDA and saphenous vein graft to obtuse marginal by Dr. Burt Knack 09/29/11. Normal EF at cath 09/2011  . Sleep apnea     "used to"      could not use 2006  . Carotid artery disease     s/p L CEA 2009 (hx of evacuation of hematoma due to heparin)  . Lymphoma 12/21/2014   Past Surgical History  Procedure Laterality Date  . Evacuation of hematoma  03/2008    left neck; S/P endarterectomy; "cause heparin clotted it up"  . Coronary angioplasty with stent placement  09/29/11    "2"  . Carotid endarterectomy  03/2008    left  . Coronary artery bypass graft  2005    CABG X3  . Basal cell carcinoma excision  2000's    nose  . Percutaneous coronary stent intervention (pci-s) N/A 09/29/2011    Procedure: PERCUTANEOUS CORONARY STENT INTERVENTION (PCI-S);  Surgeon:  Sherren Mocha, MD;  Location: [facility] CATH LAB;  Service: Cardiovascular;  Laterality: N/A;  . Mass excision Left 12/11/2014    Procedure: EXCISIONAL BIOPSY OF LEFT SUPRA CLAVICULAR NECK MASS;  Surgeon: Jerrell Belfast, MD;  Location: [facility];  Service: ENT;  Laterality: Left;  . Superficial lymph node biopsy / excision Left    Social History:  reports that he has been smoking Cigarettes.  He has a 82 pack-year smoking history. He has quit using smokeless tobacco. His smokeless tobacco use included Chew. He reports that he drinks alcohol. He reports that he uses illicit drugs (Marijuana).  Allergies  Allergen Reactions  . Heparin Other (See Comments)    Opposite  reaction Heparin induced thrombocytopenia  . Tape Rash    Family History  Problem Relation Age of Onset  . Heart disease Father   . Bone cancer Mother    *  Prior to Admission medications   Medication Sig Start Date End Date Taking? Authorizing Provider  acyclovir (ZOVIRAX) 400 MG tablet Take 1 tablet (400 mg total) by mouth daily. 12/30/14  Yes Heath Lark, MD  albuterol (PROVENTIL) (2.5 MG/3ML) 0.083% nebulizer solution Take 3 mLs (2.5 mg total) by nebulization every 2 (two) hours as needed for wheezing. 12/12/14  Yes Hosie Poisson, MD  allopurinol (ZYLOPRIM) 300 MG tablet Take 1 tablet (300 mg total) by mouth daily. 12/30/14  Yes Heath Lark, MD  aspirin EC 81 MG tablet Take 81 mg by mouth daily.   Yes Historical Provider, MD  clonazePAM (KLONOPIN) 0.5 MG tablet TAKE 1 TABLET BY MOUTH TWICE A DAY AS NEEDED FOR ANXIETY or at hs for insomnia 12/16/14  Yes Aleksei Plotnikov V, MD  clopidogrel (PLAVIX) 75 MG tablet TAKE 1 TABLET BY MOUTH DAILY WITH BREAKFAST. 08/12/14  Yes Aleksei Plotnikov V, MD  lidocaine-prilocaine (EMLA) cream Apply to affected area once 12/30/14  Yes Heath Lark, MD  metoprolol succinate (TOPROL-XL) 50 MG 24 hr tablet TAKE 2 TABLETS EVERY DAY 08/12/14  Yes Aleksei Plotnikov V, MD  ondansetron (ZOFRAN) 8 MG tablet Take 1 tablet (8 mg total) by mouth every 8 (eight) hours as needed. 12/30/14  Yes Heath Lark, MD  potassium chloride SA (KLOR-CON M20) 20 MEQ tablet TAKE 1 TABLET BY MOUTH DAILY 08/12/14  Yes Aleksei Plotnikov V, MD  simvastatin (ZOCOR) 20 MG tablet TAKE 1 TABLET (20 MG TOTAL) BY MOUTH AT BEDTIME. 09/15/14  Yes Aleksei Plotnikov V, MD  torsemide (DEMADEX) 100 MG tablet Take 100 mg by mouth daily.   Yes Historical Provider, MD  traMADol (ULTRAM) 50 MG tablet TAKE 1 TO 2 TABLETS BY MOUTH TWICE A DAY AS NEEDED FOR PAIN 12/16/14  Yes Aleksei Plotnikov V, MD  Umeclidinium-Vilanterol (ANORO ELLIPTA) 62.5-25 MCG/INH AEPB INHALE 1 ACT INTO THE LUNGS DAILY 08/12/14  Yes Aleksei  Plotnikov V, MD  naproxen sodium (ANAPROX) 220 MG tablet Take 1 tablet (220 mg total) by mouth as needed. Patient not taking: Reported on 01/12/2015 08/19/14   [name], MD  nitroGLYCERIN (NITROSTAT) 0.4 MG SL tablet Place 1 tablet (0.4 mg total) under the tongue every 5 (five) minutes as needed for chest pain (Up to 3 doses). 01/06/14 09/25/15  [name], MD  prochlorperazine (COMPAZINE) 10 MG tablet Take 1 tablet (10 mg total) by mouth every 6 (six) hours as needed (Nausea or vomiting). 12/30/14   Heath Lark, MD  tiotropium (SPIRIVA) 18 MCG inhalation capsule Place 1 capsule (18 mcg total) into inhaler and inhale daily. Patient not taking:  Reported on 01/12/2015 12/12/14   Hosie Poisson, MD   Physical Exam: Filed Vitals:   01/12/15 1327 01/12/15 1704 01/12/15 1820  BP: 114/45 122/53 123/53  Pulse: 77 78 79  Temp: 99 F (37.2 C)  99.3 F (37.4 C)  TempSrc: Oral  Oral  Resp: 18 18 20   Height:   6' (1.829 m)  Weight:   158.305 kg (349 lb)  SpO2: 92% 90% 93%    Wt Readings from Last 3 Encounters:  01/12/15 158.305 kg (349 lb)  01/12/15 159.122 kg (350 lb 12.8 oz)  12/30/14 157.806 kg (347 lb 14.4 oz)    General:  Appears calm and comfortable Eyes: PERRL, normal lids, irises & conjunctiva Neck: no LAD, masses or thyromegaly Cardiovascular: RRR, no m/r/g. Respiratory: CTA bilaterally, no w/r/r. Normal respiratory effort. Abdomen: soft, ntnd Skin: no rash or induration seen on limited exam Musculoskeletal: lower extremity redness, tenderness and swelling 3+ Psychiatric: grossly normal mood and affect, speech fluent and appropriate Neurologic: grossly non-focal.          Labs on Admission:  Basic Metabolic Panel:  Recent Labs Lab 01/12/15 1355  NA 138  K 3.5  CL 93*  CO2 33*  GLUCOSE 118*  BUN 16  CREATININE 1.01  CALCIUM 8.6*   Liver Function Tests: No results for input(s): AST, ALT, ALKPHOS, BILITOT, PROT, ALBUMIN in the last 168 hours. No results for  input(s): LIPASE, AMYLASE in the last 168 hours. No results for input(s): AMMONIA in the last 168 hours. CBC:  Recent Labs Lab 01/12/15 1355  WBC 10.1  HGB 14.7  HCT 45.3  MCV 99.3  PLT 109*   Cardiac Enzymes: No results for input(s): CKTOTAL, CKMB, CKMBINDEX, TROPONINI in the last 168 hours.  BNP (last 3 results)  Recent Labs  12/11/14 1550  BNP 77.1    ProBNP (last 3 results) No results for input(s): PROBNP in the last 8760 hours.  CBG: No results for input(s): GLUCAP in the last 168 hours.  Radiological Exams on Admission: No results found.  EKG: not done   Assessment/Plan Active Problems:   Cellulitis   Cellulitis of the lower extremities: Admitted to med surg and started on vancomycin and zosyn. Blood cultures drawn.  Elevate the legs and start the patient on IV lasix.   Hypertension: controlled.   Elevated lactic acid: probably from the infection.   Code Status: full code.  DVT Prophylaxis: Family Communication: wife at bedside.  Disposition Plan: pending.   Time spent: 55 min  [facility] Pager [phone]

## 2015-01-12 NOTE — ED Notes (Signed)
Patient CG4 critical result of 2.03 given to Dr Zenia Resides and RN S.Ouida Sills

## 2015-01-12 NOTE — ED Notes (Addendum)
Pt, being sent by [facility], c/o BLE cellulitis x 3 days.  Hx of follicular lymphoma and chronic venous insufficency.  Last chemo 5/20.

## 2015-01-12 NOTE — ED Notes (Signed)
Per pt, states B/L lower extremity edema since Saturday-no weeping-red, selling warm to touch

## 2015-01-13 ENCOUNTER — Inpatient Hospital Stay (HOSPITAL_COMMUNITY): Payer: BLUE CROSS/BLUE SHIELD

## 2015-01-13 DIAGNOSIS — K219 Gastro-esophageal reflux disease without esophagitis: Secondary | ICD-10-CM

## 2015-01-13 DIAGNOSIS — M7989 Other specified soft tissue disorders: Secondary | ICD-10-CM

## 2015-01-13 DIAGNOSIS — L03115 Cellulitis of right lower limb: Principal | ICD-10-CM

## 2015-01-13 DIAGNOSIS — R600 Localized edema: Secondary | ICD-10-CM

## 2015-01-13 DIAGNOSIS — J42 Unspecified chronic bronchitis: Secondary | ICD-10-CM

## 2015-01-13 DIAGNOSIS — I25700 Atherosclerosis of coronary artery bypass graft(s), unspecified, with unstable angina pectoris: Secondary | ICD-10-CM

## 2015-01-13 DIAGNOSIS — L03116 Cellulitis of left lower limb: Secondary | ICD-10-CM

## 2015-01-13 DIAGNOSIS — C8230 Follicular lymphoma grade IIIa, unspecified site: Secondary | ICD-10-CM

## 2015-01-13 DIAGNOSIS — L03119 Cellulitis of unspecified part of limb: Secondary | ICD-10-CM

## 2015-01-13 LAB — BASIC METABOLIC PANEL
ANION GAP: 11 (ref 5–15)
BUN: 17 mg/dL (ref 6–20)
CALCIUM: 8.3 mg/dL — AB (ref 8.9–10.3)
CHLORIDE: 91 mmol/L — AB (ref 101–111)
CO2: 33 mmol/L — AB (ref 22–32)
Creatinine, Ser: 0.99 mg/dL (ref 0.61–1.24)
GFR calc Af Amer: >60 mL/min (ref >60)
GFR calc non Af Amer: >60 mL/min (ref >60)
Glucose, Bld: 174 mg/dL — ABNORMAL HIGH (ref 65–99)
Potassium: 3.2 mmol/L — ABNORMAL LOW (ref 3.5–5.1)
SODIUM: 135 mmol/L (ref 135–145)

## 2015-01-13 MED ORDER — DIPHENHYDRAMINE HCL 25 MG PO CAPS
25.0000 mg | ORAL_CAPSULE | Freq: Once | ORAL | Status: AC
  Administered 2015-01-13: 25 mg via ORAL
  Filled 2015-01-13: qty 1

## 2015-01-13 MED ORDER — METOPROLOL SUCCINATE ER 100 MG PO TB24
100.0000 mg | ORAL_TABLET | Freq: Every day | ORAL | Status: DC
  Administered 2015-01-13: 100 mg via ORAL
  Filled 2015-01-13: qty 1

## 2015-01-13 MED ORDER — METOPROLOL SUCCINATE ER 100 MG PO TB24
100.0000 mg | ORAL_TABLET | Freq: Every day | ORAL | Status: DC
Start: 2015-01-14 — End: 2015-01-19
  Administered 2015-01-14 – 2015-01-19 (×6): 100 mg via ORAL
  Filled 2015-01-13 (×6): qty 1

## 2015-01-13 MED ORDER — TIOTROPIUM BROMIDE MONOHYDRATE 18 MCG IN CAPS: 18.0000 ug | ORAL_CAPSULE | Freq: Every day | RESPIRATORY_TRACT | Status: DC

## 2015-01-13 MED ORDER — IPRATROPIUM-ALBUTEROL 0.5-2.5 (3) MG/3ML IN SOLN
3.0000 mL | Freq: Three times a day (TID) | RESPIRATORY_TRACT | Status: DC
  Administered 2015-01-13 – 2015-01-17 (×10): 3 mL via RESPIRATORY_TRACT
  Filled 2015-01-13 (×12): qty 3

## 2015-01-13 MED ORDER — METHYLPREDNISOLONE SODIUM SUCC 125 MG IJ SOLR
60.0000 mg | Freq: Once | INTRAMUSCULAR | Status: AC
  Administered 2015-01-13: 60 mg via INTRAVENOUS
  Filled 2015-01-13: qty 0.96

## 2015-01-13 MED ORDER — LIDOCAINE-PRILOCAINE 2.5-2.5 % EX CREA: TOPICAL_CREAM | Freq: Once | CUTANEOUS | Status: DC

## 2015-01-13 NOTE — Progress Notes (Signed)
Bilateral lower extremity venous duplex completed:  No obvious evidence of DVT, superficial thrombosis, or Baker's cyst.  Technically difficult study due to the patient's body habitus.   

## 2015-01-13 NOTE — Progress Notes (Signed)
Pt advised that RT is available all night should he change his mind and want to wear cpap tonight.

## 2015-01-13 NOTE — Progress Notes (Addendum)
PATIENT DETAILS Name: [name] Age: 59 y.o. Sex: male Date of Birth: Oct 18, 1955 Admit Date: 01/12/2015 Admitting Physician Hosie Poisson, MD HUD:JSHF Plotnikov, MD  Subjective: Claims lower extremities look less erythematous than yesterday.  Assessment/Plan: Active Problems:  ? Bilateral lower extremity Cellulitis: Has bilateral lower extremity erythema, some subtle macular appearing rash in bilateral upper thighs and his anterior abdomen. Not exactly sure this is all cellulitis, but given the fact patient is in a compromised and is status post recent chemotherapy-will continue with vancomycin. Will start Benadryl and give one dose of Solu-Medrol-to see if the macular appearing rash in his bilateral thigh and anterior abdominal wall will improve. Continue leg elevation, continue Diuretics.  History of lymphoma: Recent chemotherapy with Rituximab and Bendamustine. Oncology following.  Thrombocytopenia: Likely secondary to recent chemotherapy. Follow-repeat CBC in a.m.  History of CAD status post CABG in 2005 and PCI in 2013: Continue aspirin/Plavix, statin and metoprolol. Currently without chest pain or shortness of breath.  History of carotid artery disease-status post left carotid endarterectomy in 2009:continue aspirin/Plavix and statin.  Chronic diastolic heart failure: Compensated. On IV Lasix to decrease swelling associated with cellulitis.  History of heparin-induced thrombocytopenia: Avoid Lovenox/heparin  History of obstructive sleep apnea: Offer CPAP daily at bedtime  History of gout: Continue with allopurinol-currently stable.  GERD: Continue PPI  Disposition: Remain inpatient  Antimicrobial agents  See below  Anti-infectives    Start     Dose/Rate Route Frequency Ordered Stop   01/13/15 0800  vancomycin (VANCOCIN) 1,250 mg in sodium chloride 0.9 % 250 mL IVPB     1,250 mg 166.7 mL/hr over 90 Minutes Intravenous Every 12 hours 01/12/15  1919     01/13/15 0400  piperacillin-tazobactam (ZOSYN) IVPB 3.375 g  Status:  Discontinued     3.375 g 12.5 mL/hr over 240 Minutes Intravenous Every 8 hours 01/12/15 1919 01/13/15 1134   01/12/15 2000  acyclovir (ZOVIRAX) tablet 400 mg     400 mg Oral Daily 01/12/15 1938     01/12/15 2000  vancomycin (VANCOCIN) 2,500 mg in sodium chloride 0.9 % 500 mL IVPB  Status:  Discontinued     2,500 mg 250 mL/hr over 120 Minutes Intravenous  Once 01/12/15 1855 01/12/15 1906   01/12/15 2000  vancomycin (VANCOCIN) 1,500 mg in sodium chloride 0.9 % 500 mL IVPB     1,500 mg 250 mL/hr over 120 Minutes Intravenous  Once 01/12/15 1906 01/12/15 2146   01/12/15 1915  piperacillin-tazobactam (ZOSYN) IVPB 3.375 g     3.375 g 100 mL/hr over 30 Minutes Intravenous  Once 01/12/15 1851 01/12/15 2140   01/12/15 1900  vancomycin (VANCOCIN) IVPB 1000 mg/200 mL premix  Status:  Discontinued     1,000 mg 200 mL/hr over 60 Minutes Intravenous  Once 01/12/15 1851 01/12/15 1854   01/12/15 1600  vancomycin (VANCOCIN) IVPB 1000 mg/200 mL premix     1,000 mg 200 mL/hr over 60 Minutes Intravenous  Once 01/12/15 1559 01/12/15 1745      DVT Prophylaxis:  SCD's  Code Status: Full code   Family Communication None at bedside  Procedures: None  CONSULTS:  hematology/oncology  Time spent 35 minutes-Greater than 50% of this time was spent in counseling, explanation of diagnosis, planning of further management, and coordination of care.  MEDICATIONS: Scheduled Meds: . acyclovir  400 mg Oral Daily  . allopurinol  300 mg Oral Daily  . aspirin  EC  81 mg Oral Daily  . clopidogrel  75 mg Oral Q breakfast  . furosemide  40 mg Intravenous Q12H  . metoprolol succinate  100 mg Oral Daily  . simvastatin  20 mg Oral q1800  . vancomycin  1,250 mg Intravenous Q12H   Continuous Infusions:  PRN Meds:.acetaminophen **OR** acetaminophen, albuterol, clonazePAM, morphine injection, traMADol    PHYSICAL EXAM: Vital signs  in last 24 hours: Filed Vitals:   01/12/15 1820 01/12/15 2020 01/13/15 0456 01/13/15 1043  BP: 123/53 128/48 115/49   Pulse: 79 81 83   Temp: 99.3 F (37.4 C) 100.2 F (37.9 C) 100.2 F (37.9 C)   TempSrc: Oral Oral Oral   Resp: 20 20 20    Height: 6' (1.829 m)     Weight: 158.305 kg (349 lb)  157.852 kg (348 lb)   SpO2: 93% 91% 93% 98%    Weight change:  Filed Weights   01/12/15 1820 01/13/15 0456  Weight: 158.305 kg (349 lb) 157.852 kg (348 lb)   Body mass index is 47.19 kg/(m^2).   Gen Exam: Awake and alert with clear speech.  Neck: Supple, No JVD.   Chest: B/L Clear.   CVS: S1 S2 Regular, no murmurs.  Abdomen: soft, BS +, non tender, non distended.  Extremities:See pic below Neurologic: Non Focal.   Skin: No Rash.   Wounds: N/A.          Intake/Output from previous day:  Intake/Output Summary (Last 24 hours) at 01/13/15 1343 Last data filed at 01/13/15 1230  Gross per 24 hour  Intake    800 ml  Output   1295 ml  Net   -495 ml     LAB RESULTS: CBC  Recent Labs Lab 01/12/15 1355  WBC 10.1  HGB 14.7  HCT 45.3  PLT 109*  MCV 99.3  MCH 32.2  MCHC 32.5  RDW 13.6    Chemistries   Recent Labs Lab 01/12/15 1355  NA 138  K 3.5  CL 93*  CO2 33*  GLUCOSE 118*  BUN 16  CREATININE 1.01  CALCIUM 8.6*    CBG: No results for input(s): GLUCAP in the last 168 hours.  GFR Estimated Creatinine Clearance: 120.7 mL/min (by C-G formula based on Cr of 1.01).  Coagulation profile No results for input(s): INR, PROTIME in the last 168 hours.  Cardiac Enzymes No results for input(s): CKMB, TROPONINI, MYOGLOBIN in the last 168 hours.  Invalid input(s): CK  Invalid input(s): POCBNP No results for input(s): DDIMER in the last 72 hours. No results for input(s): HGBA1C in the last 72 hours. No results for input(s): CHOL, HDL, LDLCALC, TRIG, CHOLHDL, LDLDIRECT in the last 72 hours. No results for input(s): TSH, T4TOTAL, T3FREE, THYROIDAB in the last  72 hours.  Invalid input(s): FREET3 No results for input(s): VITAMINB12, FOLATE, FERRITIN, TIBC, IRON, RETICCTPCT in the last 72 hours. No results for input(s): LIPASE, AMYLASE in the last 72 hours.  Urine Studies No results for input(s): UHGB, CRYS in the last 72 hours.  Invalid input(s): UACOL, UAPR, USPG, UPH, UTP, UGL, UKET, UBIL, UNIT, UROB, ULEU, UEPI, UWBC, URBC, UBAC, CAST, UCOM, BILUA  MICROBIOLOGY: Recent Results (from the past 240 hour(s))  Culture, blood (routine x 2)     Status: None (Preliminary result)   Collection Time: 01/12/15  4:25 PM  Result Value Ref Range Status   Specimen Description BLOOD RIGHT ASSIST CONTROL  Final   Special Requests BOTTLES DRAWN AEROBIC AND ANAEROBIC BML  Final  Culture   Final           BLOOD CULTURE RECEIVED NO GROWTH TO DATE CULTURE WILL BE HELD FOR 5 DAYS BEFORE ISSUING A FINAL NEGATIVE REPORT Performed at [facility]    Report Status PENDING  Incomplete  Culture, blood (routine x 2)     Status: None (Preliminary result)   Collection Time: 01/12/15  4:32 PM  Result Value Ref Range Status   Specimen Description BLOOD LEFT ANTECUBITAL  Final   Special Requests BOTTLES DRAWN AEROBIC AND ANAEROBIC 5 CC EA  Final   Culture   Final           BLOOD CULTURE RECEIVED NO GROWTH TO DATE CULTURE WILL BE HELD FOR 5 DAYS BEFORE ISSUING A FINAL NEGATIVE REPORT Performed at [facility]    Report Status PENDING  Incomplete    RADIOLOGY STUDIES/RESULTS: Nm Pet Image Initial (pi) Skull Base To Thigh  12/25/2014   CLINICAL DATA:  Initial treatment strategy for lymphoma.  EXAM: NUCLEAR MEDICINE PET SKULL BASE TO THIGH  TECHNIQUE: 16.0 mCi F-18 FDG was injected intravenously. Full-ring PET imaging was performed from the skull base to thigh after the radiotracer. CT data was obtained and used for attenuation correction and anatomic localization.  FASTING BLOOD GLUCOSE:  Value: 121 mg/dl  COMPARISON:  06/24/2009  FINDINGS: NECK  The  superficial left supraclavicular lymph node measuring 4.4 by 4.6 cm on image 40 of series 4 has maximum standard uptake value 12.2, [place] 5. There is a small amount of gas within this lesion suggesting recent biopsy.  Symmetric tonsillar activity. Small amount of activity superficial to the right submandibular gland is probably vascular. Probable sebaceous cyst along the right posterior lower neck, image 38 series 4, photopenic.  CHEST  Left upper prevascular lymph node measuring 1.5 cm in short axis on image 66 of series 4 has maximum standard uptake value 7.9, [place] 4. A small right upper paratracheal lymph node measuring 0.9 cm in short axis on image 60 of series 4 has maximum standard uptake value 4.3, [place] 3.  Calcified pleural plaques noted. Background vascular mediastinal activity approximately 3.6.  Other chest findings include coronary artery atherosclerosis, cardiomegaly, calcified pleural plaques (left greater than right), and bilateral airway thickening. Prior CABG.  ABDOMEN/PELVIS  Background hepatic activity 4.4. Portacaval lymph node measuring 1.6 cm in short axis on image 133 of series 4 has maximum standard uptake value 7.2, [place] 4. A a lymph node adjacent to the left adrenal gland measuring 1.9 cm in short axis on image 123 of series 4 has maximum standard uptake value 6.7, [place] 4. At the level of the left renal vessels, a left periaortic lymph node with maximum standard uptake value 7.1 measures 1.3 cm in short axis (image 136, series 4), [place] 4.  Bilateral hypermetabolic inguinal lymph nodes are present. A right inguinal lymph node measuring 1.3 cm in short axis on image 218 of series 4 has maximum standard uptake value 4.2, [place] 3  Other abdominal findings include cholelithiasis, bilateral nonobstructive nephrolithiasis, and aortoiliac atherosclerosis.  SKELETON  Heterogeneous marrow activity noted but without definite focal bony lesion. Incidental note is made  of right antecubital activity, maximum standard uptake value 8.6, probably injection related (this is the site of injection. ). Bridging spurring of both sacroiliac joints noted.  IMPRESSION: 1. Scattered hypermetabolic adenopathy in the neck, chest, abdomen, and inguinal regions, compatible with active lymphoma. 2. Airway thickening is present, suggesting bronchitis or reactive airways disease.  3. Scattered heterogeneous marrow activity without a focal lesion, uncertain significance. 4. Other findings include calcified pleural plaques left greater than right ; prior CABG; cardiomegaly; cholelithiasis; and bilateral nonobstructive nephrolithiasis.   Electronically Signed   By: Van Clines M.D.   On: 12/25/2014 09:09   Ir Fluoro Guide Cv Line Right  12/28/2014   CLINICAL DATA:  Lymphoma and need for porta cath to begin chemotherapy.  EXAM: IMPLANTED PORT A CATH PLACEMENT WITH ULTRASOUND AND FLUOROSCOPIC GUIDANCE  ANESTHESIA/SEDATION: 4.5 Mg IV Versed; 100 mcg IV Fentanyl  Total Moderate Sedation Time:  36 minutes.  Additional Medications: 3 g IV Ancef. As antibiotic prophylaxis, Ancef was ordered pre-procedure and administered intravenously within one hour of incision.  FLUOROSCOPY TIME:  18 seconds.  PROCEDURE: The procedure, risks, benefits, and alternatives were explained to the patient. Questions regarding the procedure were encouraged and answered. The patient understands and consents to the procedure. A time-out was performed prior to the procedure.  Ultrasound was used to confirm patency of the right internal jugular vein. The right neck and chest were prepped with chlorhexidine in a sterile fashion, and a sterile drape was applied covering the operative field. Maximum barrier sterile technique with sterile gowns and gloves were used for the procedure. Local anesthesia was provided with 1% lidocaine.  After creating a small venotomy incision, a 21 gauge needle was advanced into the right internal  jugular vein under direct, real-time ultrasound guidance. Ultrasound image documentation was performed. After securing guidewire access, an 8 Fr dilator was placed. A J-wire was kinked to measure appropriate catheter length.  A subcutaneous port pocket was then created along the upper chest wall utilizing sharp and blunt dissection. Portable cautery was utilized. The pocket was irrigated with sterile saline.  A single lumen power injectable port was chosen for placement. The 8 Fr catheter was tunneled from the port pocket site to the venotomy incision. The port was placed in the pocket. External catheter was trimmed to appropriate length based on guidewire measurement.  At the venotomy, an 8 Fr peel-away sheath was placed over a guidewire. The catheter was then placed through the sheath and the sheath removed. Final catheter positioning was confirmed and documented with a fluoroscopic spot image. The port was accessed with a needle and aspirated and flushed with saline. The needle was removed.  The venotomy and port pocket incisions were closed with subcutaneous 3-0 Monocryl and subcuticular 4-0 Vicryl. Dermabond was applied to both incisions.  COMPLICATIONS: None  FINDINGS: After catheter placement, the tip lies at the cavoatrial junction. The catheter aspirates normally and is ready for immediate use.  IMPRESSION: Placement of single lumen port a cath via right internal jugular vein. The catheter tip lies at the cavoatrial junction. A power injectable port a cath was placed and is ready for immediate use.   Electronically Signed   By: Aletta Edouard M.D.   On: 12/28/2014 15:02   Ir US Guide Vasc Access Right  12/28/2014   CLINICAL DATA:  Lymphoma and need for porta cath to begin chemotherapy.  EXAM: IMPLANTED PORT A CATH PLACEMENT WITH ULTRASOUND AND FLUOROSCOPIC GUIDANCE  ANESTHESIA/SEDATION: 4.5 Mg IV Versed; 100 mcg IV Fentanyl  Total Moderate Sedation Time:  36 minutes.  Additional Medications: 3 g IV  Ancef. As antibiotic prophylaxis, Ancef was ordered pre-procedure and administered intravenously within one hour of incision.  FLUOROSCOPY TIME:  18 seconds.  PROCEDURE: The procedure, risks, benefits, and alternatives were explained to the patient. Questions regarding the procedure  were encouraged and answered. The patient understands and consents to the procedure. A time-out was performed prior to the procedure.  Ultrasound was used to confirm patency of the right internal jugular vein. The right neck and chest were prepped with chlorhexidine in a sterile fashion, and a sterile drape was applied covering the operative field. Maximum barrier sterile technique with sterile gowns and gloves were used for the procedure. Local anesthesia was provided with 1% lidocaine.  After creating a small venotomy incision, a 21 gauge needle was advanced into the right internal jugular vein under direct, real-time ultrasound guidance. Ultrasound image documentation was performed. After securing guidewire access, an 8 Fr dilator was placed. A J-wire was kinked to measure appropriate catheter length.  A subcutaneous port pocket was then created along the upper chest wall utilizing sharp and blunt dissection. Portable cautery was utilized. The pocket was irrigated with sterile saline.  A single lumen power injectable port was chosen for placement. The 8 Fr catheter was tunneled from the port pocket site to the venotomy incision. The port was placed in the pocket. External catheter was trimmed to appropriate length based on guidewire measurement.  At the venotomy, an 8 Fr peel-away sheath was placed over a guidewire. The catheter was then placed through the sheath and the sheath removed. Final catheter positioning was confirmed and documented with a fluoroscopic spot image. The port was accessed with a needle and aspirated and flushed with saline. The needle was removed.  The venotomy and port pocket incisions were closed with  subcutaneous 3-0 Monocryl and subcuticular 4-0 Vicryl. Dermabond was applied to both incisions.  COMPLICATIONS: None  FINDINGS: After catheter placement, the tip lies at the cavoatrial junction. The catheter aspirates normally and is ready for immediate use.  IMPRESSION: Placement of single lumen port a cath via right internal jugular vein. The catheter tip lies at the cavoatrial junction. A power injectable port a cath was placed and is ready for immediate use.   Electronically Signed   By: Aletta Edouard M.D.   On: 12/28/2014 15:02    Oren Binet, MD  [facility] Pager:[phone]  If 7PM-7AM, please contact night-coverage [email or web address] Password [email or web address] 01/13/2015, 1:43 PM   LOS: 1 day

## 2015-01-13 NOTE — Progress Notes (Signed)
[name]  [phone] [place]  NOTE  I have seen the patient, examined him and edited the notes as follows  HPI: 59 year old man with a recent diagnosis of Follicular lymphoma, grade 3, s/p Cycle 1 chemotherapy with Rituxan and Bendamustine on 12/31/14, admitted on 5/31 with bilateral lower extremity cellulitis. He noticed leg erythema on 5/28, which became progressively worse. He also developed fevers up to 100.5, without chills or night sweats. He denies vision changes, or mucositis. Denies any respiratory complaints. Denies any chest pain or palpitations. He reports lower extremity swelling. Denies nausea, heartburn or change in bowel habits. Appetite is normal. Denies any dysuria. Denies other abnormal skin rashes, or neuropathy. Denies any bleeding issues such as epistaxis, hematemesis, hematuria or hematochezia. Ambulating without difficulty. His primary physician recommended that he be admitted for management of symptoms.  He was placed on Vanco and Zosyn after cultures were drawn. CBC is remarkable for mild thrombocytopenia, normal Hb and WBC. His bilateral lower extremity dopplers are negative for DVT.  We have been kindly informed of the patient's admission.  Oncology History   FLIPI score of 2; stage 3 and areas of involvement >4     Follicular lymphoma grade 3a   06/24/2009 Imaging PET CT scan showed two small FDG positive left neck nodes   12/11/2014 Surgery He underwent excisional lymph node biopsy of the left supraclavicular lymph node/neck region   12/11/2014 Pathology Results Accession: [id number] biopsy show follicular lymphoma   02/10/5283 Imaging ECHO showed LVH but preserved EF   12/25/2014 Imaging PET scan showed disease above and below diaphragm   12/28/2014 Procedure He has port placement   12/31/14 Chemotherapy S/p Cycle 1 chemotherapy with Rituximab and Bendamustine   01/12/15 Hospitalization Patient hospitalized for treatment  of bilateral lower extremity cellulitis.    MEDICATIONS: Scheduled Meds: . acyclovir  400 mg Oral Daily  . allopurinol  300 mg Oral Daily  . aspirin EC  81 mg Oral Daily  . clopidogrel  75 mg Oral Q breakfast  . furosemide  40 mg Intravenous Q12H  . simvastatin  20 mg Oral q1800  . vancomycin  1,250 mg Intravenous Q12H   Continuous Infusions:  PRN Meds:.acetaminophen **OR** acetaminophen, albuterol, clonazePAM, morphine injection, traMADol  ALLERGIES:   Allergies  Allergen Reactions  . Heparin Other (See Comments)    Opposite reaction Heparin induced thrombocytopenia  . Tape Rash     PHYSICAL EXAMINATION:  Filed Vitals:   01/13/15 0456  BP: 115/49  Pulse: 83  Temp: 100.2 F (37.9 C)  Resp: 20   Filed Weights   01/12/15 1820 01/13/15 0456  Weight: 349 lb (158.305 kg) 348 lb (157.852 kg)    GENERAL:alert, no distress and comfortable SKIN: remarkable for bilateral lower extremity cellulitis,and chronic venous stasis changes on the pretibial area, some ecchymoses on the upper extremities EYES: normal, conjunctiva are pink and non-injected, sclera clear OROPHARYNX:no exudate, no erythema and lips, buccal mucosa, and tongue normal  NECK: persistent lymphadenopathy in the left neck LYMPH:  no palpable lymphadenopathy in the axillary or inguinal LUNGS: clear to auscultation and percussion with normal breathing effort HEART: regular rate & rhythm and no murmurs and 2+ bilateral lower extremity edema. Right port normal ABDOMEN:abdomen soft, obese, non-tender and normal bowel sounds Musculoskeletal: bilateral lower extremity cellulitis, tender, 2+tense edema; no cyanosis of digits and no clubbing  PSYCH: alert & oriented x 3 with fluent speech NEURO: no focal motor/sensory deficits  LABORATORY/RADIOLOGY DATA:   Recent Labs Lab 01/12/15 1355  WBC 10.1  HGB 14.7  HCT 45.3  PLT 109*  MCV 99.3  MCH 32.2  MCHC 32.5  RDW 13.6    CMP    Recent Labs Lab  01/12/15 1355  NA 138  K 3.5  CL 93*  CO2 33*  GLUCOSE 118*  BUN 16  CREATININE 1.01  CALCIUM 8.6*        Component Value Date/Time   BILITOT 0.67 12/21/2014 1414   BILITOT 0.7 02/25/2013 1056   BILIDIR 0.1 02/25/2013 1056     Urinalysis    Component Value Date/Time   COLORURINE LT. YELLOW 02/25/2013 1056   APPEARANCEUR CLEAR 02/25/2013 1056   LABSPEC 1.010 02/25/2013 1056   PHURINE 6.5 02/25/2013 1056   GLUCOSEU NEGATIVE 02/25/2013 1056   GLUCOSEU NEGATIVE 03/16/2008 1010   HGBUR NEGATIVE 02/25/2013 [place] 02/25/2013 1056   KETONESUR NEGATIVE 02/25/2013 1056   PROTEINUR NEGATIVE 03/16/2008 1010   UROBILINOGEN 1.0 02/25/2013 1056   NITRITE NEGATIVE 02/25/2013 1056   LEUKOCYTESUR NEGATIVE 02/25/2013 1056   Imaging Studies: Bilateral lower extremity venous duplex on 01/13/15 shows  No obvious evidence of DVT, superficial thrombosis, or Baker's cyst. Technically difficult study due to the patient's body habitus  ASSESSMENT AND PLAN:  Follicular Lymphoma, Grade 3a S/p Cycle 1 D1  on 5/19 with Rituximab and Bendamustine and every 28 days Continue supportive care.  Bilateral Leg Cellulitis Etiology unknown, in the setting of recent chemotherapy. Patient was admitted on 5/31 from his primary doctor's office due to bilateral lower extremity cellulitis He was placed on Vanco and Zosyn, currently on day 2 Cultures are pending; consider stopping vancomycin if culture negative for MRSA  Bilateral leg edema In the setting of cellulitis and CHF Bilateral lower extremity dopplers are negative for DVT Continue antibiotics and diuresis, as well as leg elevation  Thrombocytopenia This is due to recent chemotherapy, infection, antibiotics Monitor counts closely He is on Baby Aspirin and Plavix No transfusion is indicated at this time Transfuse 1 unit of platelets if count is less or equal than 10,000 or 20,000 if the patient is acutely bleeding  Full  Code  Other medical issues as per admitting team    [facility], PA-C 01/13/2015, 12:15 PM [place], Scott Mcglinn, MD 01/13/2015

## 2015-01-13 NOTE — Progress Notes (Signed)
Per Dr Calton Dach request  - I went in to ask patient about having his PAC accessed. Patient stated "he didn't see a need in that". Informed patient it was doctor order and to let me know if he changed his mind.

## 2015-01-13 NOTE — Progress Notes (Signed)
RT discussed with PT the usage of CPAP- PT states he does not utilize CPAP at home and refuses CPAP here at the hospital. PT states he does wear 2 LPM [place] at night.

## 2015-01-13 NOTE — Progress Notes (Deleted)
[place]  [phone]    HOSPITAL PROGRESS  NOTE   HPI: 59 year old man with a recent diagnosis of Follicular lymphoma, grade 3, s/p Cycle 1 chemotherapy with Rituxan and Bendamustine on 12/31/14, admitted on 5/31 with bilateral lower extremity cellulitis. He noticed leg erythema on 5/28, which became progressively worse. He also developed fevers up to 100.5, without chills or night sweats. He denies vision changes, or mucositis. Denies any respiratory complaints. Denies any chest pain or palpitations. He reports lower extremity swelling. Denies nausea, heartburn or change in bowel habits. Appetite is normal. Denies any dysuria. Denies other abnormal skin rashes, or neuropathy. Denies any bleeding issues such as epistaxis, hematemesis, hematuria or hematochezia. Ambulating without difficulty. His primary physician recommended that he be admitted for management of symptoms.  He was placed on Vanco and Zosyn after cultures were drawn. CBC is remarkable for mild thrombocytopenia, normal Hb and WBC. His bilateral lower extremity dopplers are negative for DVT.  We have been kindly informed of the patient's admission.  Oncology History   FLIPI score of 2; stage 3 and areas of involvement >4     Follicular lymphoma grade 3a   06/24/2009 Imaging PET CT scan showed two small FDG positive left neck nodes   12/11/2014 Surgery He underwent excisional lymph node biopsy of the left supraclavicular lymph node/neck region   12/11/2014 Pathology Results Accession: [id number] biopsy show follicular lymphoma   9/47/6546 Imaging ECHO showed LVH but preserved EF   12/25/2014 Imaging PET scan showed disease above and below diaphragm   12/28/2014 Procedure He has port placement   12/31/14 Chemotherapy S/p Cycle 1 chemotherapy with Rituximab and Bendamustine   01/12/15 Hospitalization Patient hospitalized for treatment of bilateral lower extremity cellulitis.    MEDICATIONS: Scheduled  Meds: . acyclovir  400 mg Oral Daily  . allopurinol  300 mg Oral Daily  . aspirin EC  81 mg Oral Daily  . clopidogrel  75 mg Oral Q breakfast  . furosemide  40 mg Intravenous Q12H  . piperacillin-tazobactam (ZOSYN)  IV  3.375 g Intravenous Q8H  . simvastatin  20 mg Oral q1800  . vancomycin  1,250 mg Intravenous Q12H   Continuous Infusions:  PRN Meds:.acetaminophen **OR** acetaminophen, albuterol, clonazePAM, morphine injection, traMADol  ALLERGIES:   Allergies  Allergen Reactions  . Heparin Other (See Comments)    Opposite reaction Heparin induced thrombocytopenia  . Tape Rash     PHYSICAL EXAMINATION:  Filed Vitals:   01/13/15 0456  BP: 115/49  Pulse: 83  Temp: 100.2 F (37.9 C)  Resp: 20   Filed Weights   01/12/15 1820 01/13/15 0456  Weight: 349 lb (158.305 kg) 348 lb (157.852 kg)    GENERAL:alert, no distress and comfortable SKIN: remarkable for bilateral lower extremity cellulitis,and chronic venous stasis changes on the pretibial area, some ecchymoses on the upper extremities EYES: normal, conjunctiva are pink and non-injected, sclera clear OROPHARYNX:no exudate, no erythema and lips, buccal mucosa, and tongue normal  NECK: persistent lymphadenopathy in the left neck LYMPH:  no palpable lymphadenopathy in the axillary or inguinal LUNGS: clear to auscultation and percussion with normal breathing effort HEART: regular rate & rhythm and no murmurs and 2+ bilateral lower extremity edema. Right port normal ABDOMEN:abdomen soft, obese, non-tender and normal bowel sounds Musculoskeletal: bilateral lower extremity cellulitis, tender, 2+tense edema; no cyanosis of digits and no clubbing  PSYCH: alert & oriented x 3 with fluent speech NEURO: no focal motor/sensory deficits   LABORATORY/RADIOLOGY  DATA:   Recent Labs Lab 01/12/15 1355  WBC 10.1  HGB 14.7  HCT 45.3  PLT 109*  MCV 99.3  MCH 32.2  MCHC 32.5  RDW 13.6    CMP    Recent Labs Lab  01/12/15 1355  NA 138  K 3.5  CL 93*  CO2 33*  GLUCOSE 118*  BUN 16  CREATININE 1.01  CALCIUM 8.6*        Component Value Date/Time   BILITOT 0.67 12/21/2014 1414   BILITOT 0.7 02/25/2013 1056   BILIDIR 0.1 02/25/2013 1056     Urinalysis    Component Value Date/Time   COLORURINE LT. YELLOW 02/25/2013 1056   APPEARANCEUR CLEAR 02/25/2013 1056   LABSPEC 1.010 02/25/2013 1056   PHURINE 6.5 02/25/2013 1056   GLUCOSEU NEGATIVE 02/25/2013 [place] 03/16/2008 1010   HGBUR NEGATIVE 02/25/2013 [place] 02/25/2013 1056   [place] 02/25/2013 1056   PROTEINUR NEGATIVE 03/16/2008 1010   UROBILINOGEN 1.0 02/25/2013 1056   NITRITE NEGATIVE 02/25/2013 1056   LEUKOCYTESUR NEGATIVE 02/25/2013 1056   Imaging Studies: Bilateral lower extremity venous duplex on 01/13/15 shows  No obvious evidence of DVT, superficial thrombosis, or Baker's cyst. Technically difficult study due to the patient's body habitus  ASSESSMENT AND PLAN:  Follicular Lymphoma, Grade 3a S/p Cycle 1 D1  on 5/19 with Rituximab and Bendamustine and every 28 days  Bilateral Leg Cellulitis Etiology unknown, in the setting of recent chemotherapy. Patient was admitted on 5/31 from his primary doctor's office due to bilateral lower extremity cellulitis He was placed on Vanco and Zosyn, currently on day 2 Cultures are pending  Bilateral leg edema In the setting of cellulitis and CHF Bilateral lower extremity dopplers are negative for DVT Continue antibiotics and diuresis, as well as leg elevation  Thrombocytopenia This is due to recent chemotherapy, infection, antibiotics Monitor counts closely He is on Baby Aspirin and Plavix No transfusion is indicated at this time Transfuse 1 unit of platelets if count is less or equal than 10,000 or 20,000 if the patient is acutely bleeding  Full Code   Other medical issues as per admitting team    [facility] E,  PA-C 01/13/2015, 7:37 AM

## 2015-01-14 ENCOUNTER — Inpatient Hospital Stay (HOSPITAL_COMMUNITY): Payer: BLUE CROSS/BLUE SHIELD

## 2015-01-14 DIAGNOSIS — M79609 Pain in unspecified limb: Secondary | ICD-10-CM

## 2015-01-14 DIAGNOSIS — R21 Rash and other nonspecific skin eruption: Secondary | ICD-10-CM

## 2015-01-14 DIAGNOSIS — I5032 Chronic diastolic (congestive) heart failure: Secondary | ICD-10-CM | POA: Insufficient documentation

## 2015-01-14 DIAGNOSIS — L03116 Cellulitis of left lower limb: Secondary | ICD-10-CM | POA: Insufficient documentation

## 2015-01-14 DIAGNOSIS — L539 Erythematous condition, unspecified: Secondary | ICD-10-CM | POA: Insufficient documentation

## 2015-01-14 DIAGNOSIS — L03115 Cellulitis of right lower limb: Secondary | ICD-10-CM | POA: Insufficient documentation

## 2015-01-14 DIAGNOSIS — D696 Thrombocytopenia, unspecified: Secondary | ICD-10-CM

## 2015-01-14 DIAGNOSIS — Z951 Presence of aortocoronary bypass graft: Secondary | ICD-10-CM | POA: Insufficient documentation

## 2015-01-14 DIAGNOSIS — I872 Venous insufficiency (chronic) (peripheral): Secondary | ICD-10-CM

## 2015-01-14 DIAGNOSIS — C8290 Follicular lymphoma, unspecified, unspecified site: Secondary | ICD-10-CM

## 2015-01-14 DIAGNOSIS — L299 Pruritus, unspecified: Secondary | ICD-10-CM

## 2015-01-14 DIAGNOSIS — T50905A Adverse effect of unspecified drugs, medicaments and biological substances, initial encounter: Secondary | ICD-10-CM | POA: Insufficient documentation

## 2015-01-14 DIAGNOSIS — D72829 Elevated white blood cell count, unspecified: Secondary | ICD-10-CM

## 2015-01-14 DIAGNOSIS — D692 Other nonthrombocytopenic purpura: Secondary | ICD-10-CM | POA: Insufficient documentation

## 2015-01-14 DIAGNOSIS — A770 Spotted fever due to Rickettsia rickettsii: Secondary | ICD-10-CM | POA: Insufficient documentation

## 2015-01-14 DIAGNOSIS — L0390 Cellulitis, unspecified: Secondary | ICD-10-CM

## 2015-01-14 LAB — CBC
HCT: 41.1 % (ref 39.0–52.0)
Hemoglobin: 13.5 g/dL (ref 13.0–17.0)
MCH: 32.6 pg (ref 26.0–34.0)
MCHC: 32.8 g/dL (ref 30.0–36.0)
MCV: 99.3 fL (ref 78.0–100.0)
Platelets: 91 10*3/uL — ABNORMAL LOW (ref 150–400)
RBC: 4.14 MIL/uL — ABNORMAL LOW (ref 4.22–5.81)
RDW: 13.9 % (ref 11.5–15.5)
WBC: 15.4 10*3/uL — AB (ref 4.0–10.5)

## 2015-01-14 LAB — BASIC METABOLIC PANEL
Anion gap: 9 (ref 5–15)
BUN: 17 mg/dL (ref 6–20)
CHLORIDE: 96 mmol/L — AB (ref 101–111)
CO2: 34 mmol/L — AB (ref 22–32)
Calcium: 8.7 mg/dL — ABNORMAL LOW (ref 8.9–10.3)
Creatinine, Ser: 0.85 mg/dL (ref 0.61–1.24)
GFR calc Af Amer: >60 mL/min (ref >60)
Glucose, Bld: 177 mg/dL — ABNORMAL HIGH (ref 65–99)
POTASSIUM: 3.5 mmol/L (ref 3.5–5.1)
Sodium: 139 mmol/L (ref 135–145)

## 2015-01-14 MED ORDER — METHYLPREDNISOLONE SODIUM SUCC 125 MG IJ SOLR
80.0000 mg | Freq: Two times a day (BID) | INTRAMUSCULAR | Status: DC
  Administered 2015-01-14 – 2015-01-17 (×7): 80 mg via INTRAVENOUS
  Filled 2015-01-14 (×8): qty 1.28

## 2015-01-14 MED ORDER — LIDOCAINE HCL (PF) 2 % IJ SOLN
20.0000 mL | Freq: Once | INTRAMUSCULAR | Status: AC | PRN
  Filled 2015-01-14: qty 20

## 2015-01-14 MED ORDER — POTASSIUM CHLORIDE CRYS ER 20 MEQ PO TBCR
20.0000 meq | EXTENDED_RELEASE_TABLET | Freq: Every day | ORAL | Status: DC
  Administered 2015-01-14 – 2015-01-17 (×4): 20 meq via ORAL
  Filled 2015-01-14 (×4): qty 1

## 2015-01-14 MED ORDER — LIDOCAINE HCL 2 % IJ SOLN
INTRAMUSCULAR | Status: AC
  Administered 2015-01-14: 19:00:00
  Filled 2015-01-14: qty 20

## 2015-01-14 MED ORDER — MAGNESIUM HYDROXIDE 400 MG/5ML PO SUSP: 5.0000 mL | Freq: Every day | ORAL | Status: DC | PRN

## 2015-01-14 MED ORDER — POLYETHYLENE GLYCOL 3350 17 G PO PACK
17.0000 g | PACK | Freq: Every day | ORAL | Status: DC | PRN
  Administered 2015-01-14: 17 g via ORAL
  Filled 2015-01-14: qty 1

## 2015-01-14 NOTE — Progress Notes (Signed)
VASCULAR LAB PRELIMINARY  ARTERIAL  ABI completed:    RIGHT    LEFT    PRESSURE WAVEFORM  PRESSURE WAVEFORM  BRACHIAL   BRACHIAL 119 triphasic  DP   DP    AT 87 triphasic AT 105 triphasic  PT 61 biphasic PT 95 triphasic  PER   PER    GREAT TOE  NA GREAT TOE  NA    RIGHT LEFT  ABI 0.73 0.88     Guinevere Ferrari, RVT 01/14/2015, 11:46 AM

## 2015-01-14 NOTE — Progress Notes (Signed)
PATIENT DETAILS Name: [name] Age: 59 y.o. Sex: male Date of Birth: 01-18-1956 Admit Date: 01/12/2015 Admitting Physician Hosie Poisson, MD UVO:ZDGU Plotnikov, MD  Subjective: Has more dark pigmentation to his lower extremities. He has diffuse erythema in his upper chest, upper back as well. Questions as to why not a Ace wrap was placed.  Assessment/Plan: Active Problems:  ? Bilateral lower extremity Cellulitis:  note sure if this is cellulitis-as this is bilateral. Given diffuse rash in his upper chest and upper back area, suspect that this is more of a allergic reaction or a vasculitis rash-maybe triggered by recent chemotherapy. Has bilateral lower extremity erythema, some subtle macular appearing rash in bilateral upper thighs and his anterior abdomen.Have discussed case with Dr. Lucianne Lei dam, oncology-have initiated a general surgery consult for a skin biopsy. After discussion with infectious disease-we will go ahead and start patient on steroids, infectious disease plans to narrow antibiotics to cefazolin. We will continue to follow closely. Continue leg elevation, continue Diuretics.  History of lymphoma: Recent chemotherapy with Rituximab and Bendamustine. Oncology following.  Thrombocytopenia: Likely secondary to recent chemotherapy. Follow-repeat CBC in a.m.  History of CAD status post CABG in 2005 and PCI in 2013: Continue aspirin/Plavix, statin and metoprolol. Currently without chest pain or shortness of breath.  History of carotid artery disease-status post left carotid endarterectomy in 2009:continue aspirin/Plavix and statin.  Chronic diastolic heart failure: Compensated. On IV Lasix to decrease swelling associated with cellulitis.  History of heparin-induced thrombocytopenia: Avoid Lovenox/heparin  History of obstructive sleep apnea: Offer CPAP daily at bedtime  History of gout: Continue with allopurinol-currently stable.  GERD: Continue  PPI  Disposition: Remain inpatient  Antimicrobial agents  See below  Anti-infectives    Start     Dose/Rate Route Frequency Ordered Stop   01/13/15 0800  vancomycin (VANCOCIN) 1,250 mg in sodium chloride 0.9 % 250 mL IVPB     1,250 mg 166.7 mL/hr over 90 Minutes Intravenous Every 12 hours 01/12/15 1919     01/13/15 0400  piperacillin-tazobactam (ZOSYN) IVPB 3.375 g  Status:  Discontinued     3.375 g 12.5 mL/hr over 240 Minutes Intravenous Every 8 hours 01/12/15 1919 01/13/15 1134   01/12/15 2000  acyclovir (ZOVIRAX) tablet 400 mg     400 mg Oral Daily 01/12/15 1938     01/12/15 2000  vancomycin (VANCOCIN) 2,500 mg in sodium chloride 0.9 % 500 mL IVPB  Status:  Discontinued     2,500 mg 250 mL/hr over 120 Minutes Intravenous  Once 01/12/15 1855 01/12/15 1906   01/12/15 2000  vancomycin (VANCOCIN) 1,500 mg in sodium chloride 0.9 % 500 mL IVPB     1,500 mg 250 mL/hr over 120 Minutes Intravenous  Once 01/12/15 1906 01/12/15 2146   01/12/15 1915  piperacillin-tazobactam (ZOSYN) IVPB 3.375 g     3.375 g 100 mL/hr over 30 Minutes Intravenous  Once 01/12/15 1851 01/12/15 2140   01/12/15 1900  vancomycin (VANCOCIN) IVPB 1000 mg/200 mL premix  Status:  Discontinued     1,000 mg 200 mL/hr over 60 Minutes Intravenous  Once 01/12/15 1851 01/12/15 1854   01/12/15 1600  vancomycin (VANCOCIN) IVPB 1000 mg/200 mL premix     1,000 mg 200 mL/hr over 60 Minutes Intravenous  Once 01/12/15 1559 01/12/15 1745      DVT Prophylaxis:  SCD's  Code Status: Full code   Family Communication None at bedside  Procedures: None  CONSULTS:  hematology/oncology  Time spent 35 minutes-Greater than 50% of this time was spent in counseling, explanation of diagnosis, planning of further management, and coordination of care.  MEDICATIONS: Scheduled Meds: . acyclovir  400 mg Oral Daily  . allopurinol  300 mg Oral Daily  . aspirin EC  81 mg Oral Daily  . clopidogrel  75 mg Oral Q breakfast  .  furosemide  40 mg Intravenous Q12H  . ipratropium-albuterol  3 mL Nebulization TID  . lidocaine      . lidocaine-prilocaine   Topical Once  . methylPREDNISolone (SOLU-MEDROL) injection  80 mg Intravenous Q12H  . metoprolol succinate  100 mg Oral Daily  . simvastatin  20 mg Oral q1800  . vancomycin  1,250 mg Intravenous Q12H   Continuous Infusions:  PRN Meds:.acetaminophen **OR** acetaminophen, albuterol, clonazePAM, lidocaine, morphine injection, polyethylene glycol, traMADol    PHYSICAL EXAM: Vital signs in last 24 hours: Filed Vitals:   01/14/15 0749 01/14/15 0931 01/14/15 1036 01/14/15 1409  BP: 108/38   111/54  Pulse: 71   79  Temp: 97.6 F (36.4 C)   98.9 F (37.2 C)  TempSrc: Oral   Oral  Resp: 18   20  Height:      Weight:   158.305 kg (349 lb)   SpO2: 92% 86%  98%    Weight change:  Filed Weights   01/12/15 1820 01/13/15 0456 01/14/15 1036  Weight: 158.305 kg (349 lb) 157.852 kg (348 lb) 158.305 kg (349 lb)   Body mass index is 47.32 kg/(m^2).   Gen Exam: Awake and alert with clear speech.  Neck: Supple, No JVD.   Chest: B/L Clear.   CVS: S1 S2 Regular, no murmurs.  Abdomen: soft, BS +, non tender, non distended.  Extremities:See pic below Neurologic: Non Focal.   Skin: see below Wounds: N/A.          Intake/Output from previous day:  Intake/Output Summary (Last 24 hours) at 01/14/15 1634 Last data filed at 01/14/15 1410  Gross per 24 hour  Intake    725 ml  Output   2660 ml  Net  -1935 ml     LAB RESULTS: CBC  Recent Labs Lab 01/12/15 1355 01/14/15 0530  WBC 10.1 15.4*  HGB 14.7 13.5  HCT 45.3 41.1  PLT 109* 91*  MCV 99.3 99.3  MCH 32.2 32.6  MCHC 32.5 32.8  RDW 13.6 13.9    Chemistries   Recent Labs Lab 01/12/15 1355 01/13/15 1430 01/14/15 0530  NA 138 135 139  K 3.5 3.2* 3.5  CL 93* 91* 96*  CO2 33* 33* 34*  GLUCOSE 118* 174* 177*  BUN 16 17 17   CREATININE 1.01 0.99 0.85  CALCIUM 8.6* 8.3* 8.7*    CBG: No  results for input(s): GLUCAP in the last 168 hours.  GFR Estimated Creatinine Clearance: 143.7 mL/min (by C-G formula based on Cr of 0.85).  Coagulation profile No results for input(s): INR, PROTIME in the last 168 hours.  Cardiac Enzymes No results for input(s): CKMB, TROPONINI, MYOGLOBIN in the last 168 hours.  Invalid input(s): CK  Invalid input(s): POCBNP No results for input(s): DDIMER in the last 72 hours. No results for input(s): HGBA1C in the last 72 hours. No results for input(s): CHOL, HDL, LDLCALC, TRIG, CHOLHDL, LDLDIRECT in the last 72 hours. No results for input(s): TSH, T4TOTAL, T3FREE, THYROIDAB in the last 72 hours.  Invalid input(s): FREET3 No results for input(s): VITAMINB12, FOLATE, FERRITIN, TIBC,  IRON, RETICCTPCT in the last 72 hours. No results for input(s): LIPASE, AMYLASE in the last 72 hours.  Urine Studies No results for input(s): UHGB, CRYS in the last 72 hours.  Invalid input(s): UACOL, UAPR, USPG, UPH, UTP, UGL, UKET, UBIL, UNIT, UROB, ULEU, UEPI, UWBC, URBC, UBAC, CAST, UCOM, BILUA  MICROBIOLOGY: Recent Results (from the past 240 hour(s))  Culture, blood (routine x 2)     Status: None (Preliminary result)   Collection Time: 01/12/15  4:25 PM  Result Value Ref Range Status   Specimen Description BLOOD RIGHT ASSIST CONTROL  Final   Special Requests BOTTLES DRAWN AEROBIC AND ANAEROBIC BML  Final   Culture   Final           BLOOD CULTURE RECEIVED NO GROWTH TO DATE CULTURE WILL BE HELD FOR 5 DAYS BEFORE ISSUING A FINAL NEGATIVE REPORT Performed at [facility]    Report Status PENDING  Incomplete  Culture, blood (routine x 2)     Status: None (Preliminary result)   Collection Time: 01/12/15  4:32 PM  Result Value Ref Range Status   Specimen Description BLOOD LEFT ANTECUBITAL  Final   Special Requests BOTTLES DRAWN AEROBIC AND ANAEROBIC 5 CC EA  Final   Culture   Final           BLOOD CULTURE RECEIVED NO GROWTH TO DATE CULTURE WILL BE  HELD FOR 5 DAYS BEFORE ISSUING A FINAL NEGATIVE REPORT Performed at [facility]    Report Status PENDING  Incomplete    RADIOLOGY STUDIES/RESULTS: Nm Pet Image Initial (pi) Skull Base To Thigh  12/25/2014   CLINICAL DATA:  Initial treatment strategy for lymphoma.  EXAM: NUCLEAR MEDICINE PET SKULL BASE TO THIGH  TECHNIQUE: 16.0 mCi F-18 FDG was injected intravenously. Full-ring PET imaging was performed from the skull base to thigh after the radiotracer. CT data was obtained and used for attenuation correction and anatomic localization.  FASTING BLOOD GLUCOSE:  Value: 121 mg/dl  COMPARISON:  06/24/2009  FINDINGS: NECK  The superficial left supraclavicular lymph node measuring 4.4 by 4.6 cm on image 40 of series 4 has maximum standard uptake value 12.2, [place] 5. There is a small amount of gas within this lesion suggesting recent biopsy.  Symmetric tonsillar activity. Small amount of activity superficial to the right submandibular gland is probably vascular. Probable sebaceous cyst along the right posterior lower neck, image 38 series 4, photopenic.  CHEST  Left upper prevascular lymph node measuring 1.5 cm in short axis on image 66 of series 4 has maximum standard uptake value 7.9, [place] 4. A small right upper paratracheal lymph node measuring 0.9 cm in short axis on image 60 of series 4 has maximum standard uptake value 4.3, [place] 3.  Calcified pleural plaques noted. Background vascular mediastinal activity approximately 3.6.  Other chest findings include coronary artery atherosclerosis, cardiomegaly, calcified pleural plaques (left greater than right), and bilateral airway thickening. Prior CABG.  ABDOMEN/PELVIS  Background hepatic activity 4.4. Portacaval lymph node measuring 1.6 cm in short axis on image 133 of series 4 has maximum standard uptake value 7.2, [place] 4. A a lymph node adjacent to the left adrenal gland measuring 1.9 cm in short axis on image 123 of series 4 has  maximum standard uptake value 6.7, [place] 4. At the level of the left renal vessels, a left periaortic lymph node with maximum standard uptake value 7.1 measures 1.3 cm in short axis (image 136, series 4), [place] 4.  Bilateral hypermetabolic  inguinal lymph nodes are present. A right inguinal lymph node measuring 1.3 cm in short axis on image 218 of series 4 has maximum standard uptake value 4.2, [place] 3  Other abdominal findings include cholelithiasis, bilateral nonobstructive nephrolithiasis, and aortoiliac atherosclerosis.  SKELETON  Heterogeneous marrow activity noted but without definite focal bony lesion. Incidental note is made of right antecubital activity, maximum standard uptake value 8.6, probably injection related (this is the site of injection. ). Bridging spurring of both sacroiliac joints noted.  IMPRESSION: 1. Scattered hypermetabolic adenopathy in the neck, chest, abdomen, and inguinal regions, compatible with active lymphoma. 2. Airway thickening is present, suggesting bronchitis or reactive airways disease. 3. Scattered heterogeneous marrow activity without a focal lesion, uncertain significance. 4. Other findings include calcified pleural plaques left greater than right ; prior CABG; cardiomegaly; cholelithiasis; and bilateral nonobstructive nephrolithiasis.   Electronically Signed   By: Van Clines M.D.   On: 12/25/2014 09:09   Ir Fluoro Guide Cv Line Right  12/28/2014   CLINICAL DATA:  Lymphoma and need for porta cath to begin chemotherapy.  EXAM: IMPLANTED PORT A CATH PLACEMENT WITH ULTRASOUND AND FLUOROSCOPIC GUIDANCE  ANESTHESIA/SEDATION: 4.5 Mg IV Versed; 100 mcg IV Fentanyl  Total Moderate Sedation Time:  36 minutes.  Additional Medications: 3 g IV Ancef. As antibiotic prophylaxis, Ancef was ordered pre-procedure and administered intravenously within one hour of incision.  FLUOROSCOPY TIME:  18 seconds.  PROCEDURE: The procedure, risks, benefits, and alternatives were  explained to the patient. Questions regarding the procedure were encouraged and answered. The patient understands and consents to the procedure. A time-out was performed prior to the procedure.  Ultrasound was used to confirm patency of the right internal jugular vein. The right neck and chest were prepped with chlorhexidine in a sterile fashion, and a sterile drape was applied covering the operative field. Maximum barrier sterile technique with sterile gowns and gloves were used for the procedure. Local anesthesia was provided with 1% lidocaine.  After creating a small venotomy incision, a 21 gauge needle was advanced into the right internal jugular vein under direct, real-time ultrasound guidance. Ultrasound image documentation was performed. After securing guidewire access, an 8 Fr dilator was placed. A J-wire was kinked to measure appropriate catheter length.  A subcutaneous port pocket was then created along the upper chest wall utilizing sharp and blunt dissection. Portable cautery was utilized. The pocket was irrigated with sterile saline.  A single lumen power injectable port was chosen for placement. The 8 Fr catheter was tunneled from the port pocket site to the venotomy incision. The port was placed in the pocket. External catheter was trimmed to appropriate length based on guidewire measurement.  At the venotomy, an 8 Fr peel-away sheath was placed over a guidewire. The catheter was then placed through the sheath and the sheath removed. Final catheter positioning was confirmed and documented with a fluoroscopic spot image. The port was accessed with a needle and aspirated and flushed with saline. The needle was removed.  The venotomy and port pocket incisions were closed with subcutaneous 3-0 Monocryl and subcuticular 4-0 Vicryl. Dermabond was applied to both incisions.  COMPLICATIONS: None  FINDINGS: After catheter placement, the tip lies at the cavoatrial junction. The catheter aspirates normally and  is ready for immediate use.  IMPRESSION: Placement of single lumen port a cath via right internal jugular vein. The catheter tip lies at the cavoatrial junction. A power injectable port a cath was placed and is ready for immediate use.  Electronically Signed   By: Aletta Edouard M.D.   On: 12/28/2014 15:02   Ir US Guide Vasc Access Right  12/28/2014   CLINICAL DATA:  Lymphoma and need for porta cath to begin chemotherapy.  EXAM: IMPLANTED PORT A CATH PLACEMENT WITH ULTRASOUND AND FLUOROSCOPIC GUIDANCE  ANESTHESIA/SEDATION: 4.5 Mg IV Versed; 100 mcg IV Fentanyl  Total Moderate Sedation Time:  36 minutes.  Additional Medications: 3 g IV Ancef. As antibiotic prophylaxis, Ancef was ordered pre-procedure and administered intravenously within one hour of incision.  FLUOROSCOPY TIME:  18 seconds.  PROCEDURE: The procedure, risks, benefits, and alternatives were explained to the patient. Questions regarding the procedure were encouraged and answered. The patient understands and consents to the procedure. A time-out was performed prior to the procedure.  Ultrasound was used to confirm patency of the right internal jugular vein. The right neck and chest were prepped with chlorhexidine in a sterile fashion, and a sterile drape was applied covering the operative field. Maximum barrier sterile technique with sterile gowns and gloves were used for the procedure. Local anesthesia was provided with 1% lidocaine.  After creating a small venotomy incision, a 21 gauge needle was advanced into the right internal jugular vein under direct, real-time ultrasound guidance. Ultrasound image documentation was performed. After securing guidewire access, an 8 Fr dilator was placed. A J-wire was kinked to measure appropriate catheter length.  A subcutaneous port pocket was then created along the upper chest wall utilizing sharp and blunt dissection. Portable cautery was utilized. The pocket was irrigated with sterile saline.  A single  lumen power injectable port was chosen for placement. The 8 Fr catheter was tunneled from the port pocket site to the venotomy incision. The port was placed in the pocket. External catheter was trimmed to appropriate length based on guidewire measurement.  At the venotomy, an 8 Fr peel-away sheath was placed over a guidewire. The catheter was then placed through the sheath and the sheath removed. Final catheter positioning was confirmed and documented with a fluoroscopic spot image. The port was accessed with a needle and aspirated and flushed with saline. The needle was removed.  The venotomy and port pocket incisions were closed with subcutaneous 3-0 Monocryl and subcuticular 4-0 Vicryl. Dermabond was applied to both incisions.  COMPLICATIONS: None  FINDINGS: After catheter placement, the tip lies at the cavoatrial junction. The catheter aspirates normally and is ready for immediate use.  IMPRESSION: Placement of single lumen port a cath via right internal jugular vein. The catheter tip lies at the cavoatrial junction. A power injectable port a cath was placed and is ready for immediate use.   Electronically Signed   By: Aletta Edouard M.D.   On: 12/28/2014 15:02    Oren Binet, MD  [facility] Pager:[phone]  If 7PM-7AM, please contact night-coverage [email or web address] Password [email or web address] 01/14/2015, 4:34 PM   LOS: 2 days

## 2015-01-14 NOTE — Consult Note (Signed)
[facility]          Date of Admission:  01/12/2015  Date of Consult:  01/14/2015  Reason for Consult: progressive "cellulitis" vs adverse drug reaction Referring Physician: Dr. Sloan Leiter   HPI: [name] is an 59 y.o. male with hx of follicular lymphoma sp rituxan and bendamustine on 12/31/14. During that infusion patient developed itching on his head and upper back.  Red marks on face and upper chest was observed on patient. Rituximab stopped,and normal saline started. Patient received 125 mg Solumedrol, 20 mg pepcid IV, and albuterol 2.61m treatment due to shortness of breath.  Post albuterol treatment, patient in stable condition and rituximab restarted.  This weekend on Saturday 01/09/15 he again developed intense pruritis on his back, head and his legs which became acutely erythematous. He DOES suffer from chronic venous stasis changes and CHF and had previously been struggling with LE edema, what sounds to be potential bouts of cellulitis superimposed on CHF and chronic venous stasis changes. He was admitted to the hospital on 01/12/15 and blood cultures drawn and started on vancomycin and zosyn and then narrowed to vancomycin. He had macular rash on his abdomen that resolved with solumedrol dose.  Since then hee has had worsening of changes in his leg with more petechial changes and skin become black. He states that some of these areas are from where he previousl had RMSF and that they darken with flares of cellulitis. The appearnce to my eye seems more consistent with a vasculitic process or adverse drug reaction. He now had diffuse erythema from head to toe (see pictures below)  His legs are without purulent abscess. He has no open ulcers on feet. He did not put his feet into fresh water when he went fishing this weekend and no salt water or nail salon or hot tub exposure.     Past Medical History  Diagnosis Date  . Heparin induced  thrombocytopenia   . Hypertension   . Hyperlipidemia   . Obesity   . Edema   . COPD (chronic obstructive pulmonary disease)   . GERD (gastroesophageal reflux disease)   . Basal cell carcinoma of nose   . Chronic lower back pain   . Depression   . Myocardial infarction   . Heart murmur   . Dysrhythmia     fluttering  . Anginal pain     not had to use in awhile  none in 10 years  . CHF (congestive heart failure)   . Shortness of breath dyspnea     with exertion   . Diabetes mellitus without complication     borderline   . Anxiety   . Blood dyscrasia     trouble clotting   . Coronary artery disease     CABG 2005. s/p PTCA and stenting of the saphenous vein graft to PDA and saphenous vein graft to obtuse marginal by Dr. CBurt Knack2/15/13. Normal EF at cath 09/2011  . Sleep apnea     "used to"      could not use 2006  . Carotid artery disease     s/p L CEA 2009 (hx of evacuation of hematoma due to heparin)  . Lymphoma 12/21/2014    Past Surgical History  Procedure Laterality Date  . Evacuation of hematoma  03/2008    left neck; S/P endarterectomy; "cause heparin clotted it up"  . Coronary angioplasty with stent placement  09/29/11    "2"  . Carotid  endarterectomy  03/2008    left  . Coronary artery bypass graft  2005    CABG X3  . Basal cell carcinoma excision  2000's    nose  . Percutaneous coronary stent intervention (pci-s) N/A 09/29/2011    Procedure: PERCUTANEOUS CORONARY STENT INTERVENTION (PCI-S);  Surgeon: Sherren Mocha, MD;  Location: [facility] CATH LAB;  Service: Cardiovascular;  Laterality: N/A;  . Mass excision Left 12/11/2014    Procedure: EXCISIONAL BIOPSY OF LEFT SUPRA CLAVICULAR NECK MASS;  Surgeon: Jerrell Belfast, MD;  Location: [place];  Service: ENT;  Laterality: Left;  . Superficial lymph node biopsy / excision Left   ergies:   Allergies  Allergen Reactions  . Heparin Other (See Comments)    Opposite reaction Heparin induced thrombocytopenia  . Tape Rash      Medications: I have reviewed patients current medications as documented in Epic Anti-infectives    Start     Dose/Rate Route Frequency Ordered Stop   01/13/15 0800  vancomycin (VANCOCIN) 1,250 mg in sodium chloride 0.9 % 250 mL IVPB     1,250 mg 166.7 mL/hr over 90 Minutes Intravenous Every 12 hours 01/12/15 1919     01/13/15 0400  piperacillin-tazobactam (ZOSYN) IVPB 3.375 g  Status:  Discontinued     3.375 g 12.5 mL/hr over 240 Minutes Intravenous Every 8 hours 01/12/15 1919 01/13/15 1134   01/12/15 2000  acyclovir (ZOVIRAX) tablet 400 mg     400 mg Oral Daily 01/12/15 1938     01/12/15 2000  vancomycin (VANCOCIN) 2,500 mg in sodium chloride 0.9 % 500 mL IVPB  Status:  Discontinued     2,500 mg 250 mL/hr over 120 Minutes Intravenous  Once 01/12/15 1855 01/12/15 1906   01/12/15 2000  vancomycin (VANCOCIN) 1,500 mg in sodium chloride 0.9 % 500 mL IVPB     1,500 mg 250 mL/hr over 120 Minutes Intravenous  Once 01/12/15 1906 01/12/15 2146   01/12/15 1915  piperacillin-tazobactam (ZOSYN) IVPB 3.375 g     3.375 g 100 mL/hr over 30 Minutes Intravenous  Once 01/12/15 1851 01/12/15 2140   01/12/15 1900  vancomycin (VANCOCIN) IVPB 1000 mg/200 mL premix  Status:  Discontinued     1,000 mg 200 mL/hr over 60 Minutes Intravenous  Once 01/12/15 1851 01/12/15 1854   01/12/15 1600  vancomycin (VANCOCIN) IVPB 1000 mg/200 mL premix     1,000 mg 200 mL/hr over 60 Minutes Intravenous  Once 01/12/15 1559 01/12/15 1745      Social History:  reports that he has been smoking Cigarettes.  He has a 82 pack-year smoking history. He has quit using smokeless tobacco. His smokeless tobacco use included Chew. He reports that he drinks alcohol. He reports that he uses illicit drugs (Marijuana).  Family History  Problem Relation Age of Onset  . Heart disease Father   . Bone cancer Mother     As in HPI and primary teams notes otherwise 12 point review of systems is negative  Blood pressure 108/38, pulse  71, temperature 97.6 F (36.4 C), temperature source Oral, resp. rate 18, height 6' (1.829 m), weight 349 lb (158.305 kg), SpO2 86 %. General: Alert and awake, oriented x3, morbidly obese red faced. HEENT: anicteric sclera, , EOMI, oropharynx clear and without exudate CVS regular rate, normal r,  no murmur rubs or gallops Chest: clear to auscultation bilaterally, no wheezing, rales or rhonchi Abdomen: soft nontender, nondistended, normal bowel sounds, Neuro: nonfocal, strength and sensation intact  Skin:  abdomen  Legs 01/13/15"     Legs 01/14/15:            Head, chest, porta cath, arms 01/14/15:                   Results for orders placed or performed during the hospital encounter of 01/12/15 (from the past 48 hour(s))  Basic metabolic panel     Status: Abnormal   Collection Time: 01/12/15  1:55 PM  Result Value Ref Range   Sodium 138 135 - 145 mmol/L   Potassium 3.5 3.5 - 5.1 mmol/L   Chloride 93 (L) 101 - 111 mmol/L   CO2 33 (H) 22 - 32 mmol/L   Glucose, Bld 118 (H) 65 - 99 mg/dL   BUN 16 6 - 20 mg/dL   Creatinine, Ser 1.01 0.61 - 1.24 mg/dL   Calcium 8.6 (L) 8.9 - 10.3 mg/dL   GFR calc non Af Amer >60 >60 mL/min   GFR calc Af Amer >60 >60 mL/min    Comment: (NOTE) The eGFR has been calculated using the CKD EPI equation. This calculation has not been validated in all clinical situations. eGFR's persistently <60 mL/min signify possible Chronic Kidney Disease.    Anion gap 12 5 - 15  CBC     Status: Abnormal   Collection Time: 01/12/15  1:55 PM  Result Value Ref Range   WBC 10.1 4.0 - 10.5 K/uL   RBC 4.56 4.22 - 5.81 MIL/uL   Hemoglobin 14.7 13.0 - 17.0 g/dL   HCT 45.3 39.0 - 52.0 %   MCV 99.3 78.0 - 100.0 fL   MCH 32.2 26.0 - 34.0 pg   MCHC 32.5 30.0 - 36.0 g/dL   RDW 13.6 11.5 - 15.5 %   Platelets 109 (L) 150 - 400 K/uL    Comment: SPECIMEN CHECKED FOR CLOTS REPEATED TO VERIFY PLATELET COUNT CONFIRMED BY SMEAR   Culture,  blood (routine x 2)     Status: None (Preliminary result)   Collection Time: 01/12/15  4:25 PM  Result Value Ref Range   Specimen Description BLOOD RIGHT ASSIST CONTROL    Special Requests BOTTLES DRAWN AEROBIC AND ANAEROBIC BML    Culture             BLOOD CULTURE RECEIVED NO GROWTH TO DATE CULTURE WILL BE HELD FOR 5 DAYS BEFORE ISSUING A FINAL NEGATIVE REPORT Performed at [facility]    Report Status PENDING   I-Stat CG4 Lactic Acid, ED     Status: Abnormal   Collection Time: 01/12/15  4:32 PM  Result Value Ref Range   Lactic Acid, Venous 2.03 (HH) 0.5 - 2.0 mmol/L   Comment NOTIFIED PHYSICIAN   Culture, blood (routine x 2)     Status: None (Preliminary result)   Collection Time: 01/12/15  4:32 PM  Result Value Ref Range   Specimen Description BLOOD LEFT ANTECUBITAL    Special Requests BOTTLES DRAWN AEROBIC AND ANAEROBIC 5 CC EA    Culture             BLOOD CULTURE RECEIVED NO GROWTH TO DATE CULTURE WILL BE HELD FOR 5 DAYS BEFORE ISSUING A FINAL NEGATIVE REPORT Performed at [facility]    Report Status PENDING   Basic metabolic panel     Status: Abnormal   Collection Time: 01/13/15  2:30 PM  Result Value Ref Range   Sodium 135 135 - 145 mmol/L   Potassium 3.2 (L) 3.5 - 5.1 mmol/L     Chloride 91 (L) 101 - 111 mmol/L   CO2 33 (H) 22 - 32 mmol/L   Glucose, Bld 174 (H) 65 - 99 mg/dL   BUN 17 6 - 20 mg/dL   Creatinine, Ser 0.99 0.61 - 1.24 mg/dL   Calcium 8.3 (L) 8.9 - 10.3 mg/dL   GFR calc non Af Amer >60 >60 mL/min   GFR calc Af Amer >60 >60 mL/min    Comment: (NOTE) The eGFR has been calculated using the CKD EPI equation. This calculation has not been validated in all clinical situations. eGFR's persistently <60 mL/min signify possible Chronic Kidney Disease.    Anion gap 11 5 - 15  CBC     Status: Abnormal   Collection Time: 01/14/15  5:30 AM  Result Value Ref Range   WBC 15.4 (H) 4.0 - 10.5 K/uL   RBC 4.14 (L) 4.22 - 5.81 MIL/uL   Hemoglobin 13.5  13.0 - 17.0 g/dL   HCT 41.1 39.0 - 52.0 %   MCV 99.3 78.0 - 100.0 fL   MCH 32.6 26.0 - 34.0 pg   MCHC 32.8 30.0 - 36.0 g/dL   RDW 13.9 11.5 - 15.5 %   Platelets 91 (L) 150 - 400 K/uL    Comment: RESULT REPEATED AND VERIFIED CONSISTENT WITH PREVIOUS RESULT   Basic metabolic panel     Status: Abnormal   Collection Time: 01/14/15  5:30 AM  Result Value Ref Range   Sodium 139 135 - 145 mmol/L   Potassium 3.5 3.5 - 5.1 mmol/L   Chloride 96 (L) 101 - 111 mmol/L   CO2 34 (H) 22 - 32 mmol/L   Glucose, Bld 177 (H) 65 - 99 mg/dL   BUN 17 6 - 20 mg/dL   Creatinine, Ser 0.85 0.61 - 1.24 mg/dL   Calcium 8.7 (L) 8.9 - 10.3 mg/dL   GFR calc non Af Amer >60 >60 mL/min   GFR calc Af Amer >60 >60 mL/min    Comment: (NOTE) The eGFR has been calculated using the CKD EPI equation. This calculation has not been validated in all clinical situations. eGFR's persistently <60 mL/min signify possible Chronic Kidney Disease.    Anion gap 9 5 - 15   @BRIEFLABTABLE(sdes,specrequest,cult,reptstatus)   ) Recent Results (from the past 720 hour(s))  Culture, blood (routine x 2)     Status: None (Preliminary result)   Collection Time: 01/12/15  4:25 PM  Result Value Ref Range Status   Specimen Description BLOOD RIGHT ASSIST CONTROL  Final   Special Requests BOTTLES DRAWN AEROBIC AND ANAEROBIC BML  Final   Culture   Final           BLOOD CULTURE RECEIVED NO GROWTH TO DATE CULTURE WILL BE HELD FOR 5 DAYS BEFORE ISSUING A FINAL NEGATIVE REPORT Performed at [facility]    Report Status PENDING  Incomplete  Culture, blood (routine x 2)     Status: None (Preliminary result)   Collection Time: 01/12/15  4:32 PM  Result Value Ref Range Status   Specimen Description BLOOD LEFT ANTECUBITAL  Final   Special Requests BOTTLES DRAWN AEROBIC AND ANAEROBIC 5 CC EA  Final   Culture   Final           BLOOD CULTURE RECEIVED NO GROWTH TO DATE CULTURE WILL BE HELD FOR 5 DAYS BEFORE ISSUING A FINAL NEGATIVE  REPORT Performed at [facility]    Report Status PENDING  Incomplete     Impression/Recommendation  Active Problems:     Cellulitis   [name] is a 58 y.o. male with  Hx of CHF, chronic venous stasis changes prior bouts of cellulitis who appears to have a adverse drug reaciton while in the midst of chemotherapy and notably his rituxan infusion and now this past Saturday with intense pruritis esp on back and legs with admission with worsening LE edema and erythema concerning for bilateral cellulitis sp vanco/zosyn then vancomycin but worsening of his LE pathology in particular with more purpuric appearance to his legs and now intense whole body erythema and pruritis  #1 LE cellulitis vs other pathology: His legs do not appear ischemic and ABIs seem to show decent blood flow. The appearrance of his legs with perfectly symmetrical findings seems much more consistent with a  Vasculitis or adverse drug reaction. He also has diffuse erythema all over his body. Given the recent reaction to the chemotherapy I wonder if it did trigger an autoimmune process  It is possible he could have cellulitis but unusual to have symmetrical cellulitis in both legs as he would have to have. It is possible that part of pathology could be due to low platelets but it is very dramatic  This process is non-purulent and therefore less likely to be due to MRSA MSSA IF he it was a cellulitis . Furthermore some of his diffuse erythema could also be due to "red mans syndrome" so good to get vancomycin out of here anywhere.  He does not have hx of pseudomonal skin infection. He has no open ulcers on foot. He has no exposure hx for aeromonas or vibrio and no clear cut need for gram negative coverage  Therefore we will narrow to Cefazolin  I am supportive of empiric steroids for possible vasculitis/advsere drug reaction while we await possible biopsy  May need to transfer him to hospital that acutally DOES have  Dermatology support--  I spent greater than 60 minutes with the patient including greater than 50% of time in face to face counsel of the patient re his cellulititic process and differential  and in coordination of their care.     01/14/2015, 11:49 AM   Thank you so much for this interesting consult  [facility] [phone] (pager) [phone] (office) 01/14/2015, 11:49 AM   [place] 01/14/2015, 11:49 AM     

## 2015-01-14 NOTE — Procedures (Signed)
Pre-op Diagnosis: Skin lesion of right lower extremity Post-op Diagnosis: Skin lesion of right lower extremity Procedure: Two site skin punch biopsy Surgeons: Jomarie Longs PA-C Findings: Non-blanching, non-palpable purpura of bilateral lower extremities from toes to upper shin Anesthesia: Local with 28mL 2% Lidocaine (84mL each lesion) Fluids: N/A Estimated blood loss: <71mL  Specimens:  (1):  Old lesion right shin (2):  New lesion right shin  Complications: Post procedure oozing was present and controlled with 2 minutes of holding pressure and sutures Condition: Stable, good hemostasis  Procedure Details: Informed patient of risks (including those of bleeding, infection, and injury to other structures), benefits of procedure, and alternatives to the procedure. All questions were sought and answered. Written and verbal consent given by [name] to proceed with the procedure.  The right medial upper shin was selected for 2 punch biopsy locations.  Each location was marked with the end of a needle cover.  Sterile technique for procedure was done.  Injected a total of 24mL of 2% lidocaine as a field block.  A 52mm punch was used to collect the skin specimen to each site.  #1 lesion was taken from the medial upper shin over a dark purple lesion.  #2 lesion was taken from the medial upper shin over 1/2 viable normal appearing skin and 1/2 erythematous lesion.  Closed each punch hole with 3-0 chromic suture material with on simple interrupted suture.  The sutures will need to be removed in 7 days.  Dry dressing applied.  Replace the site with sterile dry dressing and replace as needed.  The patient will need to follow up with his primary care provider to get the results or if he is still here results can usually be found in epic in 3-4 days.  No need for surgical follow up with [facility].     Jomarie Longs, [facility] Surgery Office: [phone] Pager:  [phone]

## 2015-01-14 NOTE — Consult Note (Signed)
[facility] Surgery Consult Note  [name] Feb 26, 1956  [id number].    Requesting MD: Dr. Sloan Leiter Chief Complaint/Reason for Consult: Rash, need for skin biopsy  HPI:  59 y/o male with extensive PMH including Obesity, COPD, H/oMI, CHF, DM, CAD, OSA and follicular lymphoma who states he has a chronic problem with leg swelling and redness to b/l LE.  He says since Saturday his leg swelling as become significantly worse and now his skin was becoming purple in color.  He had been camping this weekend and denies being bit by a spider or other bugs.  He denies trauma.  He notes pain at the dark purple sites and he had significant itching on Saturday which is now improved.  He notes numbness in his toes, but that is chronic.  He saw his PCP on 01/12/15 who sent him to the ER secondary to cellulitis.  He was admitted to the hospital, but now the skin is turning more and more purple/black.  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  He says the tick bite he got took 1 year and dermatology to heal.  He notes he's never had a rash like this before.  He denies rash anywhere else on his body.  He's being treated with chemotherapy for follicular lymphoma, Dr. Alvy Bimler following.  He was started on Rituximab and Bendamustine on 12/31/14.  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  He got a venous LE doppler which was negative b/l.  WBC is 15.4.  Internal medicine and ID are following.     ROS: All systems reviewed and otherwise negative except for as above  Family History  Problem Relation Age of Onset  . Heart disease Father   . Bone cancer Mother     Past Medical History  Diagnosis Date  . Heparin induced thrombocytopenia   . Hypertension   . Hyperlipidemia   . Obesity   . Edema   . COPD (chronic obstructive pulmonary disease)   . GERD (gastroesophageal reflux disease)   . Basal cell carcinoma of nose    . Chronic lower back pain   . Depression   . Myocardial infarction   . Heart murmur   . Dysrhythmia     fluttering  . Anginal pain     not had to use in awhile  none in 10 years  . CHF (congestive heart failure)   . Shortness of breath dyspnea     with exertion   . Diabetes mellitus without complication     borderline   . Anxiety   . Blood dyscrasia     trouble clotting   . Coronary artery disease     CABG 2005. s/p PTCA and stenting of the saphenous vein graft to PDA and saphenous vein graft to obtuse marginal by Dr. Burt Knack 09/29/11. Normal EF at cath 09/2011  . Sleep apnea     "used to"      could not use 2006  . Carotid artery disease     s/p L CEA 2009 (hx of evacuation of hematoma due to heparin)  . Lymphoma 12/21/2014    Past Surgical History  Procedure Laterality Date  . Evacuation of hematoma  03/2008    left neck; S/P endarterectomy; "cause heparin clotted it up"  . Coronary angioplasty with stent placement  09/29/11    "2"  . Carotid endarterectomy  03/2008    left  . Coronary artery bypass graft  2005    CABG X3  . Basal cell carcinoma excision  2000's    nose  . Percutaneous coronary stent intervention (pci-s) N/A 09/29/2011    Procedure: PERCUTANEOUS CORONARY STENT INTERVENTION (PCI-S);  Surgeon: Sherren Mocha, MD;  Location: [facility] CATH LAB;  Service: Cardiovascular;  Laterality: N/A;  . Mass excision Left 12/11/2014    Procedure: EXCISIONAL BIOPSY OF LEFT SUPRA CLAVICULAR NECK MASS;  Surgeon: Jerrell Belfast, MD;  Location: [place];  Service: ENT;  Laterality: Left;  . Superficial lymph node biopsy / excision Left     Social History:  reports that he has been smoking Cigarettes.  He has a 82 pack-year smoking history. He has quit using smokeless tobacco. His smokeless tobacco use included Chew. He reports that he drinks alcohol. He reports that he uses illicit drugs (Marijuana).  Allergies:  Allergies  Allergen Reactions  . Heparin Other (See Comments)     Opposite reaction Heparin induced thrombocytopenia  . Tape Rash    Medications Prior to Admission  Medication Sig Dispense Refill  . acyclovir (ZOVIRAX) 400 MG tablet Take 1 tablet (400 mg total) by mouth daily. 30 tablet 3  . albuterol (PROVENTIL) (2.5 MG/3ML) 0.083% nebulizer solution Take 3 mLs (2.5 mg total) by nebulization every 2 (two) hours as needed for wheezing. 75 mL 2  . allopurinol (ZYLOPRIM) 300 MG tablet Take 1 tablet (300 mg total) by mouth daily. 30 tablet 3  . aspirin EC 81 MG tablet Take 81 mg by mouth daily.    . clonazePAM (KLONOPIN) 0.5 MG tablet TAKE 1 TABLET BY MOUTH TWICE A DAY AS NEEDED FOR ANXIETY or at hs for insomnia 180 tablet 0  . clopidogrel (PLAVIX) 75 MG tablet TAKE 1 TABLET BY MOUTH DAILY WITH BREAKFAST. 90 tablet 3  . lidocaine-prilocaine (EMLA) cream Apply to affected area once 30 g 3  . metoprolol succinate (TOPROL-XL) 50 MG 24 hr tablet TAKE 2 TABLETS EVERY DAY 180 tablet 3  . ondansetron (ZOFRAN) 8 MG tablet Take 1 tablet (8 mg total) by mouth every 8 (eight) hours as needed. 30 tablet 1  . potassium chloride SA (KLOR-CON M20) 20 MEQ tablet TAKE 1 TABLET BY MOUTH DAILY 90 tablet 3  . simvastatin (ZOCOR) 20 MG tablet TAKE 1 TABLET (20 MG TOTAL) BY MOUTH AT BEDTIME. 90 tablet 3  . torsemide (DEMADEX) 100 MG tablet Take 100 mg by mouth daily.    . traMADol (ULTRAM) 50 MG tablet TAKE 1 TO 2 TABLETS BY MOUTH TWICE A DAY AS NEEDED FOR PAIN 360 tablet 0  . [DISCONTINUED] Umeclidinium-Vilanterol (ANORO ELLIPTA) 62.5-25 MCG/INH AEPB INHALE 1 ACT INTO THE LUNGS DAILY 180 each 3  . naproxen sodium (ANAPROX) 220 MG tablet Take 1 tablet (220 mg total) by mouth as needed. (Patient not taking: Reported on 01/12/2015) 60 tablet 2  . nitroGLYCERIN (NITROSTAT) 0.4 MG SL tablet Place 1 tablet (0.4 mg total) under the tongue every 5 (five) minutes as needed for chest pain (Up to 3 doses). 25 tablet 4  . prochlorperazine (COMPAZINE) 10 MG tablet Take 1 tablet (10 mg total) by  mouth every 6 (six) hours as needed (Nausea or vomiting). 30 tablet 1  . tiotropium (SPIRIVA) 18 MCG inhalation capsule Place 1 capsule (18 mcg total) into inhaler and inhale daily. (Patient not taking: Reported on 01/12/2015) 30 capsule 2    Blood pressure 108/38, pulse 71, temperature 97.6 F (36.4 C), temperature  source Oral, resp. rate 18, height 6' (1.829 m), weight 158.305 kg (349 lb), SpO2 86 %. Physical Exam: General: Largely obese with largely central obese abdomen, pleasant, WD/WN white male who is laying in bed in NAD HEENT: head is normocephalic, atraumatic.  Sclera are noninjected.  Ears and nose without any masses or lesions.  Mouth is pink and moist Heart: regular, rate, and rhythm.  No obvious murmurs, gallops, or rubs noted.   Lungs: CTAB, no wheezes, rhonchi, or rales noted.  Respiratory effort nonlabored Abd: largely obese, soft, NT/ND, +BS, no masses, hernias, or organomegaly MS: all 4 extremities are symmetrical with no cyanosis, clubbing, or edema. Skin: warm and dry, B/L LE with petechiae and purpura to LE below knees, purpura is not palpable and not blanchable.  Purpura is darker purple on the medial and inferior aspects of the shin, extends from proximal shin to toes, sparing the tips of the toes.  Diffuse swelling of the LE to the knees.  Rest of skin without similar lesions.  Distal CSM intact b/l.   Psych: A&Ox3 with an appropriate affect.    01/14/15   01/12/15 in ER    Results for orders placed or performed during the hospital encounter of 01/12/15 (from the past 48 hour(s))  Basic metabolic panel     Status: Abnormal   Collection Time: 01/12/15  1:55 PM  Result Value Ref Range   Sodium 138 135 - 145 mmol/L   Potassium 3.5 3.5 - 5.1 mmol/L   Chloride 93 (L) 101 - 111 mmol/L   CO2 33 (H) 22 - 32 mmol/L   Glucose, Bld 118 (H) 65 - 99 mg/dL   BUN 16 6 - 20 mg/dL   Creatinine, Ser 1.01 0.61 - 1.24 mg/dL   Calcium 8.6 (L) 8.9 - 10.3 mg/dL   GFR calc non Af  Amer >60 >60 mL/min   GFR calc Af Amer >60 >60 mL/min    Comment: (NOTE) The eGFR has been calculated using the CKD EPI equation. This calculation has not been validated in all clinical situations. eGFR's persistently <60 mL/min signify possible Chronic Kidney Disease.    Anion gap 12 5 - 15  CBC     Status: Abnormal   Collection Time: 01/12/15  1:55 PM  Result Value Ref Range   WBC 10.1 4.0 - 10.5 K/uL   RBC 4.56 4.22 - 5.81 MIL/uL   Hemoglobin 14.7 13.0 - 17.0 g/dL   HCT 45.3 39.0 - 52.0 %   MCV 99.3 78.0 - 100.0 fL   MCH 32.2 26.0 - 34.0 pg   MCHC 32.5 30.0 - 36.0 g/dL   RDW 13.6 11.5 - 15.5 %   Platelets 109 (L) 150 - 400 K/uL    Comment: SPECIMEN CHECKED FOR CLOTS REPEATED TO VERIFY PLATELET COUNT CONFIRMED BY SMEAR   Culture, blood (routine x 2)     Status: None (Preliminary result)   Collection Time: 01/12/15  4:25 PM  Result Value Ref Range   Specimen Description BLOOD RIGHT ASSIST CONTROL    Special Requests BOTTLES DRAWN AEROBIC AND ANAEROBIC BML    Culture             BLOOD CULTURE RECEIVED NO GROWTH TO DATE CULTURE WILL BE HELD FOR 5 DAYS BEFORE ISSUING A FINAL NEGATIVE REPORT Performed at [facility]    Report Status PENDING   I-Stat CG4 Lactic Acid, ED     Status: Abnormal   Collection Time: 01/12/15  4:32 PM  Result  Value Ref Range   Lactic Acid, Venous 2.03 (HH) 0.5 - 2.0 mmol/L   Comment NOTIFIED PHYSICIAN   Culture, blood (routine x 2)     Status: None (Preliminary result)   Collection Time: 01/12/15  4:32 PM  Result Value Ref Range   Specimen Description BLOOD LEFT ANTECUBITAL    Special Requests BOTTLES DRAWN AEROBIC AND ANAEROBIC 5 CC EA    Culture             BLOOD CULTURE RECEIVED NO GROWTH TO DATE CULTURE WILL BE HELD FOR 5 DAYS BEFORE ISSUING A FINAL NEGATIVE REPORT Performed at [facility]    Report Status PENDING   Basic metabolic panel     Status: Abnormal   Collection Time: 01/13/15  2:30 PM  Result Value Ref Range    Sodium 135 135 - 145 mmol/L   Potassium 3.2 (L) 3.5 - 5.1 mmol/L   Chloride 91 (L) 101 - 111 mmol/L   CO2 33 (H) 22 - 32 mmol/L   Glucose, Bld 174 (H) 65 - 99 mg/dL   BUN 17 6 - 20 mg/dL   Creatinine, Ser 0.99 0.61 - 1.24 mg/dL   Calcium 8.3 (L) 8.9 - 10.3 mg/dL   GFR calc non Af Amer >60 >60 mL/min   GFR calc Af Amer >60 >60 mL/min    Comment: (NOTE) The eGFR has been calculated using the CKD EPI equation. This calculation has not been validated in all clinical situations. eGFR's persistently <60 mL/min signify possible Chronic Kidney Disease.    Anion gap 11 5 - 15  CBC     Status: Abnormal   Collection Time: 01/14/15  5:30 AM  Result Value Ref Range   WBC 15.4 (H) 4.0 - 10.5 K/uL   RBC 4.14 (L) 4.22 - 5.81 MIL/uL   Hemoglobin 13.5 13.0 - 17.0 g/dL   HCT 41.1 39.0 - 52.0 %   MCV 99.3 78.0 - 100.0 fL   MCH 32.6 26.0 - 34.0 pg   MCHC 32.8 30.0 - 36.0 g/dL   RDW 13.9 11.5 - 15.5 %   Platelets 91 (L) 150 - 400 K/uL    Comment: RESULT REPEATED AND VERIFIED CONSISTENT WITH PREVIOUS RESULT   Basic metabolic panel     Status: Abnormal   Collection Time: 01/14/15  5:30 AM  Result Value Ref Range   Sodium 139 135 - 145 mmol/L   Potassium 3.5 3.5 - 5.1 mmol/L   Chloride 96 (L) 101 - 111 mmol/L   CO2 34 (H) 22 - 32 mmol/L   Glucose, Bld 177 (H) 65 - 99 mg/dL   BUN 17 6 - 20 mg/dL   Creatinine, Ser 0.85 0.61 - 1.24 mg/dL   Calcium 8.7 (L) 8.9 - 10.3 mg/dL   GFR calc non Af Amer >60 >60 mL/min   GFR calc Af Amer >60 >60 mL/min    Comment: (NOTE) The eGFR has been calculated using the CKD EPI equation. This calculation has not been validated in all clinical situations. eGFR's persistently <60 mL/min signify possible Chronic Kidney Disease.    Anion gap 9 5 - 15   No results found.    Assessment/Plan Progressive B/L Lower extremity rash, edema Non-blanching, non-palpable purpura - concern for vasculitis ?chemotherapy reaction -Appears to be vasculitis of some sort.  May  need skin biopsy, but he is currently on aspirin and plavix (already given today).  Will discuss with Dr. Johney Maine about timing of procedure -Dr. Tommy Medal from ID following  Leukocytosis -15.4 H/o rocky mountain spotted fever H/o follicular lymphoma - followed by Dr. Alvy Bimler Thrombocytopenia H/o HIT    Nat Christen, [facility] Surgery 01/14/2015, 10:42 AM Pager: [phone]

## 2015-01-14 NOTE — Consult Note (Signed)
WOC wound consult note Reason for Consult: This is a no-charge visit made per request of Dr. Nena Alexander to further explain the rationale for the decision to not wrap bilateral LEs at this time with ACE bandages to the patient. Dr. Sloan Leiter and I have discussed this patient and reviewed photographs of the LEs taken today in the ED. The clinical presentation of the LEs differs markedly from the presentation of 24 hours prior, with deep red/maroon/purple discoloration in the malleolar areas.  Dr. Sloan Leiter has ordered an ABI and that is being conducted at the time of my visit.  Patient is clear that he wants to keep the edema down so that the "skin doesn't tear open".  While we share his concern for ulceration, the risk of obscuring the view of his changing LEs from the watchful eye of the registered nurses and the medical staff is great. Patient reluctantly agrees to [place] that does not consist of compression at this time. [place] nursing team will not follow, but will remain available to this patient, the nursing and medical teams.  Please re-consult if needed. Thanks, Maudie Flakes, MSN, RN, [place], [place], [place] [phone])

## 2015-01-14 NOTE — Progress Notes (Signed)
[name]   DOB:[birth year]   DG#:[id number]   RJJ#:[id number]  Patient Care Team: [name], MD as PCP - General (Internal Medicine) [name], MD as Consulting Physician (Cardiology)  I have seen the patient, examined him and edited the notes as follows  Subjective: Patient seen and examined. He denies fevers, chills, night sweats, vision changes, or mucositis. Denies any worsening respiratory complaints. Denies any chest pain or palpitations. His lower extremity swelling is NOT improving. Denies nausea, heartburn or change in bowel habits. Last bowel movement on 6/1   Appetite is normal. Denies any dysuria. Denies neuropathy.  His legs continue to be painful due to leg cellulitis, along with significant bruising in the area. Denies other bleeding issues such as epistaxis, hematemesis, hematuria or hematochezia. Ambulating frequently. He also noted diffuse skin rash elsewhere.  Scheduled Meds: . acyclovir  400 mg Oral Daily  . allopurinol  300 mg Oral Daily  . aspirin EC  81 mg Oral Daily  . clopidogrel  75 mg Oral Q breakfast  . furosemide  40 mg Intravenous Q12H  . ipratropium-albuterol  3 mL Nebulization TID  . lidocaine-prilocaine   Topical Once  . metoprolol succinate  100 mg Oral Daily  . simvastatin  20 mg Oral q1800  . vancomycin  1,250 mg Intravenous Q12H   Continuous Infusions:  PRN Meds:.acetaminophen **OR** acetaminophen, albuterol, clonazePAM, morphine injection, traMADol  Objective:  Filed Vitals:   01/13/15 2217  BP: 116/66  Pulse: 68  Temp: 98 F (36.7 C)  Resp: 20      Intake/Output Summary (Last 24 hours) at 01/14/15 0160 Last data filed at 01/13/15 2217  Gross per 24 hour  Intake   1665 ml  Output   2855 ml  Net  -1190 ml    ECOG PERFORMANCE STATUS:1  GENERAL:alert, no distress and comfortable SKIN: remarkable for bilateral lower extremity cellulitis,and chronic venous stasis changes on the pretibial area,?hematoma formation at the  dorsal aspect of pedal and lower pretibial region, some ecchymoses on the upper extremities. He also has diffuse skin rash throughout which is macular in nature along with significant plethora on his face. EYES: normal, conjunctiva are pink and non-injected, sclera clear OROPHARYNX:no exudate, no erythema and lips, buccal mucosa, and tongue normal  NECK: persistent lymphadenopathy in the left neck LYMPH: no palpable lymphadenopathy in the axillary or inguinal LUNGS: clear to auscultation and percussion with normal breathing effort HEART: regular rate & rhythm and no murmurs and 2+ bilateral lower extremity edema. Right port normal ABDOMEN:abdomen soft, obese, non-tender and normal bowel sounds Musculoskeletal: bilateral lower extremity cellulitis, tender,1- 2+ tense edema; no cyanosis of digits and no clubbing  PSYCH: alert & oriented x 3 with fluent speech NEURO: no focal motor/sensory deficits     CBG (last 3)  No results for input(s): GLUCAP in the last 72 hours.   Labs:   Recent Labs Lab 01/12/15 1355 01/14/15 0530  WBC 10.1 15.4*  HGB 14.7 13.5  HCT 45.3 41.1  PLT 109* 91*  MCV 99.3 99.3  MCH 32.2 32.6  MCHC 32.5 32.8  RDW 13.6 13.9     Chemistries:    Recent Labs Lab 01/12/15 1355 01/13/15 1430 01/14/15 0530  NA 138 135 139  K 3.5 3.2* 3.5  CL 93* 91* 96*  CO2 33* 33* 34*  GLUCOSE 118* 174* 177*  BUN 16 17 17   CREATININE 1.01 0.99 0.85  CALCIUM 8.6* 8.3* 8.7*     Urine Studies  Component Value Date/Time   COLORURINE LT. YELLOW 02/25/2013 1056   APPEARANCEUR CLEAR 02/25/2013 1056   LABSPEC 1.010 02/25/2013 1056   PHURINE 6.5 02/25/2013 1056   GLUCOSEU NEGATIVE 02/25/2013 1056   GLUCOSEU NEGATIVE 03/16/2008 1010   HGBUR NEGATIVE 02/25/2013 1056   BILIRUBINUR NEGATIVE 02/25/2013 1056   KETONESUR NEGATIVE 02/25/2013 1056   PROTEINUR NEGATIVE 03/16/2008 1010   UROBILINOGEN 1.0 02/25/2013 1056   NITRITE NEGATIVE 02/25/2013 [place] 02/25/2013 1056   Microbiology Cultures are pending   Imaging Studies:  No results found.  Assessment/Plan: 59 y.o.  Follicular Lymphoma, Grade 3a S/p Cycle 1 D1 on 5/19 with Rituximab and Bendamustine and every 28 days Continue supportive care.  Bilateral Leg Cellulitis and diffuse skin rash Etiology unknown, in the setting of recent chemotherapy. Clinically, this does not appear to be related to side effects of bendamustine. Patient was admitted on 5/31 from his primary doctor's office due to bilateral lower extremity cellulitis He was placed on Vanco and Zosyn, currently on day 3 Cultures are pending; consider stopping vancomycin if culture negative for MRSA I recommend ID consult and possible skin biopsy.  Bilateral leg edema In the setting of cellulitis and CHF Bilateral lower extremity dopplers are negative for DVT Slowly improving Continue antibiotics and diuresis, as well as leg elevation  Thrombocytopenia This is due to recent chemotherapy, one dose ofSolumedrol on 6/1, infection, antibiotics Monitor counts closely He is on Baby Aspirin and Plavix No transfusion is indicated at this time, platelets at 91,000 Transfuse 1 unit of platelets if count is less or equal than 10,000 or 20,000 if the patient is acutely bleeding  Leukocytosis This is likely reactive due to pain, inflammation, possible infection. No intervention is indicated at this time Will continue to monitor   Full Code  Other medical issues including CHF, CAD, Sleep apnea, gout, GERD as per admitting team   [facility] E, PA-C 01/14/2015  7:06 AM Jagger Demonte, MD 01/14/2015

## 2015-01-14 NOTE — Progress Notes (Signed)
Pt refused CPAP for tonight. Pt states he does not wear CPAP at home. Pt remains on 2Lpm nasal cannula. RT will continue to monitor as needed.

## 2015-01-15 ENCOUNTER — Other Ambulatory Visit (HOSPITAL_COMMUNITY): Payer: BLUE CROSS/BLUE SHIELD

## 2015-01-15 DIAGNOSIS — K5900 Constipation, unspecified: Secondary | ICD-10-CM

## 2015-01-15 DIAGNOSIS — D692 Other nonthrombocytopenic purpura: Secondary | ICD-10-CM

## 2015-01-15 DIAGNOSIS — T887XXS Unspecified adverse effect of drug or medicament, sequela: Secondary | ICD-10-CM

## 2015-01-15 DIAGNOSIS — J449 Chronic obstructive pulmonary disease, unspecified: Secondary | ICD-10-CM

## 2015-01-15 LAB — COMPREHENSIVE METABOLIC PANEL
ALK PHOS: 57 U/L (ref 38–126)
ALT: 27 U/L (ref 17–63)
AST: 22 U/L (ref 15–41)
Albumin: 3.1 g/dL — ABNORMAL LOW (ref 3.5–5.0)
Anion gap: 13 (ref 5–15)
BILIRUBIN TOTAL: 0.6 mg/dL (ref 0.3–1.2)
BUN: 25 mg/dL — AB (ref 6–20)
CHLORIDE: 93 mmol/L — AB (ref 101–111)
CO2: 31 mmol/L (ref 22–32)
Calcium: 8.7 mg/dL — ABNORMAL LOW (ref 8.9–10.3)
Creatinine, Ser: 0.82 mg/dL (ref 0.61–1.24)
Glucose, Bld: 215 mg/dL — ABNORMAL HIGH (ref 65–99)
POTASSIUM: 3.9 mmol/L (ref 3.5–5.1)
Sodium: 137 mmol/L (ref 135–145)
Total Protein: 6.6 g/dL (ref 6.5–8.1)

## 2015-01-15 LAB — CBC
HEMATOCRIT: 41.6 % (ref 39.0–52.0)
Hemoglobin: 13.3 g/dL (ref 13.0–17.0)
MCH: 31.7 pg (ref 26.0–34.0)
MCHC: 32 g/dL (ref 30.0–36.0)
MCV: 99.3 fL (ref 78.0–100.0)
Platelets: 109 10*3/uL — ABNORMAL LOW (ref 150–400)
RBC: 4.19 MIL/uL — ABNORMAL LOW (ref 4.22–5.81)
RDW: 14.1 % (ref 11.5–15.5)
WBC: 21.7 10*3/uL — AB (ref 4.0–10.5)

## 2015-01-15 LAB — HIV ANTIBODY (ROUTINE TESTING W REFLEX): HIV SCREEN 4TH GENERATION: NONREACTIVE

## 2015-01-15 LAB — C-REACTIVE PROTEIN: CRP: 8.3 mg/dL — ABNORMAL HIGH (ref <1.0)

## 2015-01-15 LAB — SEDIMENTATION RATE: SED RATE: 38 mm/h — AB (ref 0–16)

## 2015-01-15 MED ORDER — MAGNESIUM CITRATE PO SOLN
1.0000 | Freq: Once | ORAL | Status: AC
  Administered 2015-01-15: 1 via ORAL

## 2015-01-15 MED ORDER — DIPHENHYDRAMINE HCL 25 MG PO CAPS
25.0000 mg | ORAL_CAPSULE | Freq: Three times a day (TID) | ORAL | Status: AC
  Administered 2015-01-15 (×3): 25 mg via ORAL
  Filled 2015-01-15 (×3): qty 1

## 2015-01-15 MED ORDER — CEFAZOLIN SODIUM 1-5 GM-% IV SOLN
1.0000 g | Freq: Three times a day (TID) | INTRAVENOUS | Status: AC
  Administered 2015-01-15 – 2015-01-18 (×11): 1 g via INTRAVENOUS
  Filled 2015-01-15 (×12): qty 50

## 2015-01-15 MED ORDER — TRIAMCINOLONE ACETONIDE 0.1 % EX OINT
TOPICAL_OINTMENT | Freq: Three times a day (TID) | CUTANEOUS | Status: DC
  Administered 2015-01-15 – 2015-01-19 (×12): via TOPICAL
  Filled 2015-01-15 (×4): qty 15

## 2015-01-15 MED ORDER — SENNOSIDES-DOCUSATE SODIUM 8.6-50 MG PO TABS
2.0000 | ORAL_TABLET | Freq: Every day | ORAL | Status: DC
  Administered 2015-01-16 – 2015-01-17 (×2): 2 via ORAL
  Filled 2015-01-15 (×4): qty 2

## 2015-01-15 MED ORDER — POLYETHYLENE GLYCOL 3350 17 G PO PACK: 17.0000 g | PACK | Freq: Once | ORAL | Status: DC

## 2015-01-15 NOTE — Progress Notes (Signed)
[name]   DOB:Aug 31, 1955   LG#:[id number]   YCX#:[id number]  Patient Care Team: [name], MD as PCP - General (Internal Medicine) [name], MD as Consulting Physician (Cardiology)  I have seen the patient, examined him and edited the notes as follows  Subjective: Patient seen and examined. He denies fevers, chills, night sweats, vision changes, or mucositis. Denies shortness of breath, has some wheezing this morning, awaiting his breathing treatments. Denies any chest pain or palpitations. Continues to have lower extremity swelling. Denies nausea, heartburn or change in bowel habits. Last bowel movement on 6/2. Appetite is normal. Denies any dysuria. Denies neuropathy. His legs are painful due to leg cellulitis, along with significant bruising in the area and some blister formation.Has some areas of diffuse skin rash as well.  Denies other bleeding issues such as epistaxis, hematemesis, hematuria or hematochezia. Ambulating frequently. SKIN biopsy, infectious disease consult and ABI were performed yesterday  Scheduled Meds: . acyclovir  400 mg Oral Daily  . allopurinol  300 mg Oral Daily  . aspirin EC  81 mg Oral Daily  . clopidogrel  75 mg Oral Q breakfast  . furosemide  40 mg Intravenous Q12H  . ipratropium-albuterol  3 mL Nebulization TID  . lidocaine-prilocaine   Topical Once  . methylPREDNISolone (SOLU-MEDROL) injection  80 mg Intravenous Q12H  . metoprolol succinate  100 mg Oral Daily  . potassium chloride  20 mEq Oral Daily  . simvastatin  20 mg Oral q1800  . vancomycin  1,250 mg Intravenous Q12H   Continuous Infusions:  PRN Meds:.acetaminophen **OR** acetaminophen, albuterol, clonazePAM, magnesium hydroxide, morphine injection, polyethylene glycol, traMADol   Objective:  Filed Vitals:   01/15/15 0511  BP: 130/66  Pulse: 75  Temp: 98.5 F (36.9 C)  Resp: 20      Intake/Output Summary (Last 24 hours) at 01/15/15 0720 Last data filed at 01/15/15 4970   Gross per 24 hour  Intake    360 ml  Output   2500 ml  Net  -2140 ml    ECOG PERFORMANCE STATUS:1  GENERAL:alert, no distress and comfortable SKIN: remarkable for bilateral lower extremity cellulitis,and chronic venous stasis changes on the pretibial area,?hematoma formation at the dorsal aspect of pedal and lower pretibial region, some ecchymoses on the upper extremities. Some blister formation present on the lower extremities. He also has diffuse skin rash throughout which is macular in nature along with significant plethora on his face. EYES: normal, conjunctiva are pink and non-injected, sclera clear OROPHARYNX:no exudate, no erythema and lips, buccal mucosa, and tongue normal  NECK: persistent lymphadenopathy in the left neck LYMPH: no palpable lymphadenopathy in the axillary or inguinal LUNGS:remarkable for anterior bilateral wheezing without rhonchi or rales.  HEART: regular rate & rhythm and no murmurs and 2+ bilateral lower extremity edema. Right port non tender, some erythema surrounding the area, no discharge noted. ABDOMEN:abdomen soft, obese, non-tender and normal bowel sounds Musculoskeletal: bilateral lower extremity cellulitis, tender,1- 2+ tense edema; no cyanosis of digits and no clubbing  PSYCH: alert & oriented x 3 with fluent speech NEURO: no focal motor/sensory deficits    CBG (last 3)  No results for input(s): GLUCAP in the last 72 hours.   Labs:   Recent Labs Lab 01/12/15 1355 01/14/15 0530 01/15/15 0500  WBC 10.1 15.4* 21.7*  HGB 14.7 13.5 13.3  HCT 45.3 41.1 41.6  PLT 109* 91* 109*  MCV 99.3 99.3 99.3  MCH 32.2 32.6 31.7  MCHC 32.5 32.8 32.0  RDW 13.6 13.9 14.1     Chemistries:    Recent Labs Lab 01/12/15 1355 01/13/15 1430 01/14/15 0530 01/15/15 0500  NA 138 135 139 137  K 3.5 3.2* 3.5 3.9  CL 93* 91* 96* 93*  CO2 33* 33* 34* 31  GLUCOSE 118* 174* 177* 215*  BUN 16 17 17  25*  CREATININE 1.01 0.99 0.85 0.82  CALCIUM 8.6* 8.3*  8.7* 8.7*  AST  --   --   --  22  ALT  --   --   --  27  ALKPHOS  --   --   --  57  BILITOT  --   --   --  0.6     Urine Studies     Component Value Date/Time   COLORURINE LT. YELLOW 02/25/2013 1056   APPEARANCEUR CLEAR 02/25/2013 1056   LABSPEC 1.010 02/25/2013 1056   PHURINE 6.5 02/25/2013 1056   GLUCOSEU NEGATIVE 02/25/2013 1056   GLUCOSEU NEGATIVE 03/16/2008 1010   HGBUR NEGATIVE 02/25/2013 1056   BILIRUBINUR NEGATIVE 02/25/2013 1056   KETONESUR NEGATIVE 02/25/2013 1056   PROTEINUR NEGATIVE 03/16/2008 1010   UROBILINOGEN 1.0 02/25/2013 1056   NITRITE NEGATIVE 02/25/2013 1056   LEUKOCYTESUR NEGATIVE 02/25/2013 1056   Microbiology Cultures are pending   Imaging Studies:  No results found.  Assessment/Plan: 59 y.o.  Follicular Lymphoma, Grade 3a S/p Cycle 1 D1 on 5/19 with Rituximab and Bendamustine and every 28 days Continue supportive care. Question has been raised whether the skin rash was treated by reaction to bendamustine. Even though the clinical presentation is atypical, I cannot completely rule out skin reaction as the precipitating event. In any case, there is no reversal agent or antidote for this apart from supportive care and corticosteroid therapy. I am quite reluctant to recommend high-dose steroidal therapy for now due to possibility of causing poor wound healing.  Bilateral Leg Cellulitis and diffuse skin rash/ possible vasculitis Etiology unknown, in the setting of recent chemotherapy. Clinically, this does not appear to be related to typical side effects of bendamustine. Patient was admitted on 5/31 from his primary doctor's office due to bilateral lower extremity cellulitis He was placed on Vanco and Zosyn, now to be narrowed to Cefazolin Cultures are pending ID consult obtained on 6/2, suspecting vasculitis versus adverse drug reaction. As this is non purulent, this is less likely to be due to MRSA or MSSA .Thus, his antibiotic therapy will be  narrowed to Cefazolin  Recommend adding round he clock benadryl Surgical consultation on 6/2, proceeded with right lower extremity punch biopsy, results pending (rule out vasculitis) MRI not possible due to excessive weight  Bilateral leg edema In the setting of suspected cellulitis versus other skin abnormality, and CHF Bilateral lower extremity dopplers are negative for DVT Slowly improving Continue antibiotics and diuresis, as well as leg elevation  COPD He has some wheezing today He is on supportive measures including oxygen and nebulizers, to receive respiratory therapy today  Thrombocytopenia This is due to recent chemotherapy,infection, antibiotics Monitor counts closely He is on Baby Aspirin and Plavix He is also on prednisone No transfusion is indicated at this time, platelets at 109,000  Leukocytosis This is likely reactive due to pain, inflammation, steroids and possible infection. Cultures are pending He is afebrile No intervention is indicated at this time Will continue to monitor  Constipation This is due to pain medicine and poor mobility. I added magnesium citrate per patient request.  Full Code  Other medical issues including  CHF, CAD, Sleep apnea, gout, GERD as per admitting team Consider transfer to tertiary center with in patient dermatology coverage   Rondel Jumbo, PA-C 01/15/2015  7:20 AM GORSUCH, NI, MD 01/15/2015

## 2015-01-15 NOTE — Progress Notes (Signed)
[facility]    Subjective: No new complaints, feels legs are about the same.   Antibiotics:  Anti-infectives    Start     Dose/Rate Route Frequency Ordered Stop   01/15/15 1000  ceFAZolin (ANCEF) IVPB 1 g/50 mL premix     1 g 100 mL/hr over 30 Minutes Intravenous 3 times per day 01/15/15 0857     01/13/15 0800  vancomycin (VANCOCIN) 1,250 mg in sodium chloride 0.9 % 250 mL IVPB  Status:  Discontinued     1,250 mg 166.7 mL/hr over 90 Minutes Intravenous Every 12 hours 01/12/15 1919 01/15/15 0855   01/13/15 0400  piperacillin-tazobactam (ZOSYN) IVPB 3.375 g  Status:  Discontinued     3.375 g 12.5 mL/hr over 240 Minutes Intravenous Every 8 hours 01/12/15 1919 01/13/15 1134   01/12/15 2000  acyclovir (ZOVIRAX) tablet 400 mg     400 mg Oral Daily 01/12/15 1938     01/12/15 2000  vancomycin (VANCOCIN) 2,500 mg in sodium chloride 0.9 % 500 mL IVPB  Status:  Discontinued     2,500 mg 250 mL/hr over 120 Minutes Intravenous  Once 01/12/15 1855 01/12/15 1906   01/12/15 2000  vancomycin (VANCOCIN) 1,500 mg in sodium chloride 0.9 % 500 mL IVPB     1,500 mg 250 mL/hr over 120 Minutes Intravenous  Once 01/12/15 1906 01/12/15 2146   01/12/15 1915  piperacillin-tazobactam (ZOSYN) IVPB 3.375 g     3.375 g 100 mL/hr over 30 Minutes Intravenous  Once 01/12/15 1851 01/12/15 2140   01/12/15 1900  vancomycin (VANCOCIN) IVPB 1000 mg/200 mL premix  Status:  Discontinued     1,000 mg 200 mL/hr over 60 Minutes Intravenous  Once 01/12/15 1851 01/12/15 1854   01/12/15 1600  vancomycin (VANCOCIN) IVPB 1000 mg/200 mL premix     1,000 mg 200 mL/hr over 60 Minutes Intravenous  Once 01/12/15 1559 01/12/15 1745      Medications: Scheduled Meds: . acyclovir  400 mg Oral Daily  . allopurinol  300 mg Oral Daily  . aspirin EC  81 mg Oral Daily  .  ceFAZolin (ANCEF) IV  1 g Intravenous 3 times per day  . clopidogrel  75 mg Oral Q breakfast  . diphenhydrAMINE  25 mg Oral TID  .  furosemide  40 mg Intravenous Q12H  . ipratropium-albuterol  3 mL Nebulization TID  . lidocaine-prilocaine   Topical Once  . methylPREDNISolone (SOLU-MEDROL) injection  80 mg Intravenous Q12H  . metoprolol succinate  100 mg Oral Daily  . polyethylene glycol  17 g Oral Once  . potassium chloride  20 mEq Oral Daily  . simvastatin  20 mg Oral q1800   Continuous Infusions:  PRN Meds:.acetaminophen **OR** acetaminophen, albuterol, clonazePAM, magnesium hydroxide, morphine injection, traMADol    Objective: Weight change:   Intake/Output Summary (Last 24 hours) at 01/15/15 1046 Last data filed at 01/15/15 0755  Gross per 24 hour  Intake    480 ml  Output   2000 ml  Net  -1520 ml   Blood pressure 130/66, pulse 75, temperature 98.5 F (36.9 C), temperature source Oral, resp. rate 20, height 6' (1.829 m), weight 353 lb 3.2 oz (160.21 kg), SpO2 95 %. Temp:  [98.3 F (36.8 C)-98.9 F (37.2 C)] 98.5 F (36.9 C) (06/03 0511) Pulse Rate:  [74-79] 75 (06/03 0511) Resp:  [20] 20 (06/03 0511) BP: (111-130)/(54-66) 130/66 mmHg (06/03 0511) SpO2:  [94 %-98 %] 95 % (06/03 0920) Weight:  [  350 lb 15.6 oz (159.2 kg)-353 lb 3.2 oz (160.21 kg)] 353 lb 3.2 oz (160.21 kg) (06/03 0950)  Physical Exam: General: Alert and awake, oriented x3, NAD on 2L O2 via [place] [facility] RRR no m/r/g Chest: CTAB, no wheezing, rales or rhonchi, very minimal erythema of chest wall and upper abdomen. Abdomen: soft nontender, distended, normal bowel sounds Extremities: lower extremities remain mildly erythmatous with darkened skin, increased blistering of the skin today. Neuro: nonfocal  CBC: CBC    Component Value Date/Time   WBC 21.7* 01/15/2015 0500   WBC 10.7* 12/21/2014 1413   RBC 4.19* 01/15/2015 0500   RBC 4.69 12/21/2014 1413   HGB 13.3 01/15/2015 0500   HGB 15.2 12/21/2014 1413   HCT 41.6 01/15/2015 0500   HCT 46.2 12/21/2014 1413   PLT 109* 01/15/2015 0500   PLT 140 12/21/2014 1413   MCV 99.3 01/15/2015  0500   MCV 98.5* 12/21/2014 1413   MCH 31.7 01/15/2015 0500   MCH 32.4 12/21/2014 1413   MCHC 32.0 01/15/2015 0500   MCHC 32.9 12/21/2014 1413   RDW 14.1 01/15/2015 0500   RDW 13.0 12/21/2014 1413   LYMPHSABS 1.5 12/28/2014 1210   LYMPHSABS 1.5 12/21/2014 1413   MONOABS 1.2* 12/28/2014 1210   MONOABS 1.2* 12/21/2014 1413   EOSABS 0.3 12/28/2014 1210   EOSABS 0.4 12/21/2014 1413   BASOSABS 0.1 12/28/2014 1210   BASOSABS 0.1 12/21/2014 1413       BMET  Recent Labs  01/14/15 0530 01/15/15 0500  NA 139 137  K 3.5 3.9  CL 96* 93*  CO2 34* 31  GLUCOSE 177* 215*  BUN 17 25*  CREATININE 0.85 0.82  CALCIUM 8.7* 8.7*     Liver Panel   Recent Labs  01/15/15 0500  PROT 6.6  ALBUMIN 3.1*  AST 22  ALT 27  ALKPHOS 57  BILITOT 0.6       Sedimentation Rate  Recent Labs  01/15/15 0500  ESRSEDRATE 38*   C-Reactive Protein  Recent Labs  01/15/15 0500  CRP 8.3*    Micro Results: Recent Results (from the past 720 hour(s))  Culture, blood (routine x 2)     Status: None (Preliminary result)   Collection Time: 01/12/15  4:25 PM  Result Value Ref Range Status   Specimen Description BLOOD RIGHT ASSIST CONTROL  Final   Special Requests BOTTLES DRAWN AEROBIC AND ANAEROBIC BML  Final   Culture   Final           BLOOD CULTURE RECEIVED NO GROWTH TO DATE CULTURE WILL BE HELD FOR 5 DAYS BEFORE ISSUING A FINAL NEGATIVE REPORT Performed at [facility]    Report Status PENDING  Incomplete  Culture, blood (routine x 2)     Status: None (Preliminary result)   Collection Time: 01/12/15  4:32 PM  Result Value Ref Range Status   Specimen Description BLOOD LEFT ANTECUBITAL  Final   Special Requests BOTTLES DRAWN AEROBIC AND ANAEROBIC 5 CC EA  Final   Culture   Final           BLOOD CULTURE RECEIVED NO GROWTH TO DATE CULTURE WILL BE HELD FOR 5 DAYS BEFORE ISSUING A FINAL NEGATIVE REPORT Performed at [facility]    Report Status PENDING  Incomplete    Culture, blood (routine x 2)     Status: None (Preliminary result)   Collection Time: 01/14/15 11:29 AM  Result Value Ref Range Status   Specimen Description BLOOD RIGHT HAND  Final   Special Requests BOTTLES DRAWN AEROBIC ONLY 5CC  Final   Culture   Final           BLOOD CULTURE RECEIVED NO GROWTH TO DATE CULTURE WILL BE HELD FOR 5 DAYS BEFORE ISSUING A FINAL NEGATIVE REPORT Performed at [facility]    Report Status PENDING  Incomplete    Studies/Results: No results found.    Assessment/Plan:  Active Problems:   Cellulitis   Cellulitis of right lower extremity   Cellulitis of left lower extremity   Erythroderma   Purpura   RMSF [facility] spotted fever)   Adverse drug reaction   Follicular lymphoma   Congestive heart disease   Chronic diastolic CHF (congestive heart failure)   Thrombocytopenia   Coronary artery disease involving coronary bypass graft of native heart with unstable angina pectoris    [name] is a 59 y.o. male with hx of CHF, chronic venous stasis changes of lower extremity currently receving treatment for Follicular lymphoma with Rituximab and Bendamustine who developed bilateral leg cellulitis vs drug rash vs vasculitis  Bilateral LE cellulitis versus other pathology: ABI show moderate arterial flow reduction in right leg and mild reduction in left leg.  LE dopplers show no evidence of DVT.  Abx have been narrowed to Cefazolin.  He has been placed on empiric Solumedrol for possible drug reaction/vasculitis while we wait for the results of the skin biopsy.  His increasing leukocytosis is likely due to the steroids. - Continue Solu-medrol and IV ancef - Follow up skin biopsy.   LOS: 3 days   Lucious Groves 01/15/2015, 10:46 AM

## 2015-01-15 NOTE — Progress Notes (Signed)
PATIENT DETAILS Name: [name] Age: 59 y.o. Sex: male Date of Birth: November 09, 1955 Admit Date: 01/12/2015 Admitting Physician Hosie Poisson, MD SRP:RXYV Plotnikov, MD  Subjective: Lower extremity essentially unchanged today.  Assessment/Plan: Active Problems:  ? Bilateral lower extremity Cellulitis:  Doubt that this is cellulitis, suspect that this is more of a vasculitis/allergic reaction to his most recent chemotherapy. Infectious disease following, underwent punch biopsy on 6/2-pending results. Empirically started on Solu-Medrol. Lower extremity appears essentially the same as of yesterday. Unfortunately, no dermatology coverage available here in the hospital. This M.D. spoke with patient today, offered to transfer to a tertiary center with dermatology coverage, patient informed me that he would rather stay here in [place], and would rather follow-up with his primary dermatologist Jarome Matin M.D.) at [facility] dermatology. I subsequently spoke with Dr. Ronnald Ramp over the phone, and after patient gave me permission (both verbal and written)-I sent him some pictures-Dr. Ronnald Ramp suggested that we continue steroids, add topical triamcinolone and await biopsy results. Dr. 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 Because of low platelet count and inflammation from stasis could have led to hemorrhage within the skin giving it dark appearance. We will continue to follow closely, and if any deterioration in his rash/skin will again offer patient transferred to a tertiary center with dermatology coverage. We will continue to follow closely. Continue leg elevation, continue Diuretics, Solumedrol and IV Ancef  History of lymphoma: Recent chemotherapy with Rituximab and Bendamustine. Oncology following.  Thrombocytopenia: Likely secondary to recent chemotherapy.Improving- CBC in a.m.  Leukocytosis: Likely from steroids.Will  follow.  History of CAD status post CABG in 2005 and PCI in 2013: Continue aspirin/Plavix, statin and metoprolol. Currently without chest pain or shortness of breath.  History of carotid artery disease-status post left carotid endarterectomy in 2009:continue aspirin/Plavix and statin.  Chronic diastolic heart failure: Compensated. On IV Lasix to decrease swelling associated with cellulitis.  History of heparin-induced thrombocytopenia: Avoid Lovenox/heparin  History of obstructive sleep apnea: Offer CPAP daily at bedtime  History of gout: Continue with allopurinol-currently stable.  GERD: Continue PPI  Disposition: Remain inpatient  Antimicrobial agents  See below  Anti-infectives    Start     Dose/Rate Route Frequency Ordered Stop   01/15/15 1000  ceFAZolin (ANCEF) IVPB 1 g/50 mL premix     1 g 100 mL/hr over 30 Minutes Intravenous 3 times per day 01/15/15 0857     01/13/15 0800  vancomycin (VANCOCIN) 1,250 mg in sodium chloride 0.9 % 250 mL IVPB  Status:  Discontinued     1,250 mg 166.7 mL/hr over 90 Minutes Intravenous Every 12 hours 01/12/15 1919 01/15/15 0855   01/13/15 0400  piperacillin-tazobactam (ZOSYN) IVPB 3.375 g  Status:  Discontinued     3.375 g 12.5 mL/hr over 240 Minutes Intravenous Every 8 hours 01/12/15 1919 01/13/15 1134   01/12/15 2000  acyclovir (ZOVIRAX) tablet 400 mg     400 mg Oral Daily 01/12/15 1938     01/12/15 2000  vancomycin (VANCOCIN) 2,500 mg in sodium chloride 0.9 % 500 mL IVPB  Status:  Discontinued     2,500 mg 250 mL/hr over 120 Minutes Intravenous  Once 01/12/15 1855 01/12/15 1906   01/12/15 2000  vancomycin (VANCOCIN) 1,500 mg in sodium chloride 0.9 % 500 mL IVPB     1,500 mg 250 mL/hr over 120 Minutes Intravenous  Once 01/12/15 1906 01/12/15  2146   01/12/15 1915  piperacillin-tazobactam (ZOSYN) IVPB 3.375 g     3.375 g 100 mL/hr over 30 Minutes Intravenous  Once 01/12/15 1851 01/12/15 2140   01/12/15 1900  vancomycin (VANCOCIN) IVPB  1000 mg/200 mL premix  Status:  Discontinued     1,000 mg 200 mL/hr over 60 Minutes Intravenous  Once 01/12/15 1851 01/12/15 1854   01/12/15 1600  vancomycin (VANCOCIN) IVPB 1000 mg/200 mL premix     1,000 mg 200 mL/hr over 60 Minutes Intravenous  Once 01/12/15 1559 01/12/15 1745      DVT Prophylaxis:  SCD's  Code Status: Full code   Family Communication None at bedside  Procedures: None  CONSULTS:  hematology/oncology  Time spent 45 minutes-Greater than 50% of this time was spent in counseling, explanation of diagnosis, planning of further management, and coordination of care.  MEDICATIONS: Scheduled Meds: . acyclovir  400 mg Oral Daily  . allopurinol  300 mg Oral Daily  . aspirin EC  81 mg Oral Daily  .  ceFAZolin (ANCEF) IV  1 g Intravenous 3 times per day  . clopidogrel  75 mg Oral Q breakfast  . diphenhydrAMINE  25 mg Oral TID  . furosemide  40 mg Intravenous Q12H  . ipratropium-albuterol  3 mL Nebulization TID  . lidocaine-prilocaine   Topical Once  . methylPREDNISolone (SOLU-MEDROL) injection  80 mg Intravenous Q12H  . metoprolol succinate  100 mg Oral Daily  . polyethylene glycol  17 g Oral Once  . potassium chloride  20 mEq Oral Daily  . simvastatin  20 mg Oral q1800  . triamcinolone ointment   Topical TID   Continuous Infusions:  PRN Meds:.acetaminophen **OR** acetaminophen, albuterol, clonazePAM, magnesium hydroxide, morphine injection, traMADol    PHYSICAL EXAM: Vital signs in last 24 hours: Filed Vitals:   01/15/15 0511 01/15/15 0649 01/15/15 0920 01/15/15 0950  BP: 130/66     Pulse: 75     Temp: 98.5 F (36.9 C)     TempSrc: Oral     Resp: 20     Height:      Weight:  159.2 kg (350 lb 15.6 oz)  160.21 kg (353 lb 3.2 oz)  SpO2: 95%  95%     Weight change:  Filed Weights   01/14/15 1036 01/15/15 0649 01/15/15 0950  Weight: 158.305 kg (349 lb) 159.2 kg (350 lb 15.6 oz) 160.21 kg (353 lb 3.2 oz)   Body mass index is 47.89 kg/(m^2).    Gen Exam: Awake and alert with clear speech.  Neck: Supple, No JVD.   Chest: B/L Clear.   CVS: S1 S2 Regular, no murmurs.  Abdomen: soft, BS +, non tender, non distended.  Extremities:See pic below Neurologic: Non Focal.   Skin: see pics below from today Wounds: N/A.           Intake/Output from previous day:  Intake/Output Summary (Last 24 hours) at 01/15/15 1352 Last data filed at 01/15/15 0755  Gross per 24 hour  Intake    480 ml  Output   2000 ml  Net  -1520 ml     LAB RESULTS: CBC  Recent Labs Lab 01/12/15 1355 01/14/15 0530 01/15/15 0500  WBC 10.1 15.4* 21.7*  HGB 14.7 13.5 13.3  HCT 45.3 41.1 41.6  PLT 109* 91* 109*  MCV 99.3 99.3 99.3  MCH 32.2 32.6 31.7  MCHC 32.5 32.8 32.0  RDW 13.6 13.9 14.1    Chemistries   Recent Labs Lab 01/12/15 1355 01/13/15  1430 01/14/15 0530 01/15/15 0500  NA 138 135 139 137  K 3.5 3.2* 3.5 3.9  CL 93* 91* 96* 93*  CO2 33* 33* 34* 31  GLUCOSE 118* 174* 177* 215*  BUN 16 17 17  25*  CREATININE 1.01 0.99 0.85 0.82  CALCIUM 8.6* 8.3* 8.7* 8.7*    CBG: No results for input(s): GLUCAP in the last 168 hours.  GFR Estimated Creatinine Clearance: 149.9 mL/min (by C-G formula based on Cr of 0.82).  Coagulation profile No results for input(s): INR, PROTIME in the last 168 hours.  Cardiac Enzymes No results for input(s): CKMB, TROPONINI, MYOGLOBIN in the last 168 hours.  Invalid input(s): CK  Invalid input(s): POCBNP No results for input(s): DDIMER in the last 72 hours. No results for input(s): HGBA1C in the last 72 hours. No results for input(s): CHOL, HDL, LDLCALC, TRIG, CHOLHDL, LDLDIRECT in the last 72 hours. No results for input(s): TSH, T4TOTAL, T3FREE, THYROIDAB in the last 72 hours.  Invalid input(s): FREET3 No results for input(s): VITAMINB12, FOLATE, FERRITIN, TIBC, IRON, RETICCTPCT in the last 72 hours. No results for input(s): LIPASE, AMYLASE in the last 72 hours.  Urine Studies No results  for input(s): UHGB, CRYS in the last 72 hours.  Invalid input(s): UACOL, UAPR, USPG, UPH, UTP, UGL, UKET, UBIL, UNIT, UROB, ULEU, UEPI, UWBC, URBC, UBAC, CAST, UCOM, BILUA  MICROBIOLOGY: Recent Results (from the past 240 hour(s))  Culture, blood (routine x 2)     Status: None (Preliminary result)   Collection Time: 01/12/15  4:25 PM  Result Value Ref Range Status   Specimen Description BLOOD RIGHT ASSIST CONTROL  Final   Special Requests BOTTLES DRAWN AEROBIC AND ANAEROBIC BML  Final   Culture   Final           BLOOD CULTURE RECEIVED NO GROWTH TO DATE CULTURE WILL BE HELD FOR 5 DAYS BEFORE ISSUING A FINAL NEGATIVE REPORT Performed at [facility]    Report Status PENDING  Incomplete  Culture, blood (routine x 2)     Status: None (Preliminary result)   Collection Time: 01/12/15  4:32 PM  Result Value Ref Range Status   Specimen Description BLOOD LEFT ANTECUBITAL  Final   Special Requests BOTTLES DRAWN AEROBIC AND ANAEROBIC 5 CC EA  Final   Culture   Final           BLOOD CULTURE RECEIVED NO GROWTH TO DATE CULTURE WILL BE HELD FOR 5 DAYS BEFORE ISSUING A FINAL NEGATIVE REPORT Performed at [facility]    Report Status PENDING  Incomplete  Culture, blood (routine x 2)     Status: None (Preliminary result)   Collection Time: 01/14/15 11:29 AM  Result Value Ref Range Status   Specimen Description BLOOD RIGHT HAND  Final   Special Requests BOTTLES DRAWN AEROBIC ONLY 5CC  Final   Culture   Final           BLOOD CULTURE RECEIVED NO GROWTH TO DATE CULTURE WILL BE HELD FOR 5 DAYS BEFORE ISSUING A FINAL NEGATIVE REPORT Performed at [facility]    Report Status PENDING  Incomplete    RADIOLOGY STUDIES/RESULTS: Nm Pet Image Initial (pi) Skull Base To Thigh  12/25/2014   CLINICAL DATA:  Initial treatment strategy for lymphoma.  EXAM: NUCLEAR MEDICINE PET SKULL BASE TO THIGH  TECHNIQUE: 16.0 mCi F-18 FDG was injected intravenously. Full-ring PET imaging was performed  from the skull base to thigh after the radiotracer. CT data was obtained  and used for attenuation correction and anatomic localization.  FASTING BLOOD GLUCOSE:  Value: 121 mg/dl  COMPARISON:  06/24/2009  FINDINGS: NECK  The superficial left supraclavicular lymph node measuring 4.4 by 4.6 cm on image 40 of series 4 has maximum standard uptake value 12.2, Deauville 5. There is a small amount of gas within this lesion suggesting recent biopsy.  Symmetric tonsillar activity. Small amount of activity superficial to the right submandibular gland is probably vascular. Probable sebaceous cyst along the right posterior lower neck, image 38 series 4, photopenic.  CHEST  Left upper prevascular lymph node measuring 1.5 cm in short axis on image 66 of series 4 has maximum standard uptake value 7.9, [place]auville 4. A small right upper paratracheal lymph node measuring 0.9 cm in short axis on image 60 of series 4 has maximum standard uptake value 4.3, Deauville 3.  Calcified pleural plaques noted. Background vascular mediastinal activity approximately 3.6.  Other chest findings include coronary artery atherosclerosis, cardiomegaly, calcified pleural plaques (left greater than right), and bilateral airway thickening. Prior CABG.  ABDOMEN/PELVIS  Background hepatic activity 4.4. Portacaval lymph node measuring 1.6 cm in short axis on image 133 of series 4 has maximum standard uptake value 7.2, [place]ville 4. A a lymph node adjacent to the left adrenal gland measuring 1.9 cm in short axis on image 123 of series 4 has maximum standard uptake value 6.7, [place]ville 4. At the level of the left renal vessels, a left periaortic lymph node with maximum standard uptake value 7.1 measures 1.3 cm in short axis (image 136, series 4), Deauville 4.  Bilateral hypermetabolic inguinal lymph nodes are present. A right inguinal lymph node measuring 1.3 cm in short axis on image 218 of series 4 has maximum standard uptake value 4.2, Deauville 3  Other  abdominal findings include cholelithiasis, bilateral nonobstructive nephrolithiasis, and aortoiliac atherosclerosis.  SKELETON  Heterogeneous marrow activity noted but without definite focal bony lesion. Incidental note is made of right antecubital activity, maximum standard uptake value 8.6, probably injection related (this is the site of injection. ). Bridging spurring of both sacroiliac joints noted.  IMPRESSION: 1. Scattered hypermetabolic adenopathy in the neck, chest, abdomen, and inguinal regions, compatible with active lymphoma. 2. Airway thickening is present, suggesting bronchitis or reactive airways disease. 3. Scattered heterogeneous marrow activity without a focal lesion, uncertain significance. 4. Other findings include calcified pleural plaques left greater than right ; prior CABG; cardiomegaly; cholelithiasis; and bilateral nonobstructive nephrolithiasis.   Electronically Signed   By: Van Clines M.D.   On: 12/25/2014 09:09   Ir Fluoro Guide Cv Line Right  12/28/2014   CLINICAL DATA:  Lymphoma and need for porta cath to begin chemotherapy.  EXAM: IMPLANTED PORT A CATH PLACEMENT WITH ULTRASOUND AND FLUOROSCOPIC GUIDANCE  ANESTHESIA/SEDATION: 4.5 Mg IV Versed; 100 mcg IV Fentanyl  Total Moderate Sedation Time:  36 minutes.  Additional Medications: 3 g IV Ancef. As antibiotic prophylaxis, Ancef was ordered pre-procedure and administered intravenously within one hour of incision.  FLUOROSCOPY TIME:  18 seconds.  PROCEDURE: The procedure, risks, benefits, and alternatives were explained to the patient. Questions regarding the procedure were encouraged and answered. The patient understands and consents to the procedure. A time-out was performed prior to the procedure.  Ultrasound was used to confirm patency of the right internal jugular vein. The right neck and chest were prepped with chlorhexidine in a sterile fashion, and a sterile drape was applied covering the operative field. Maximum barrier  sterile technique with sterile gowns  and gloves were used for the procedure. Local anesthesia was provided with 1% lidocaine.  After creating a small venotomy incision, a 21 gauge needle was advanced into the right internal jugular vein under direct, real-time ultrasound guidance. Ultrasound image documentation was performed. After securing guidewire access, an 8 Fr dilator was placed. A J-wire was kinked to measure appropriate catheter length.  A subcutaneous port pocket was then created along the upper chest wall utilizing sharp and blunt dissection. Portable cautery was utilized. The pocket was irrigated with sterile saline.  A single lumen power injectable port was chosen for placement. The 8 Fr catheter was tunneled from the port pocket site to the venotomy incision. The port was placed in the pocket. External catheter was trimmed to appropriate length based on guidewire measurement.  At the venotomy, an 8 Fr peel-away sheath was placed over a guidewire. The catheter was then placed through the sheath and the sheath removed. Final catheter positioning was confirmed and documented with a fluoroscopic spot image. The port was accessed with a needle and aspirated and flushed with saline. The needle was removed.  The venotomy and port pocket incisions were closed with subcutaneous 3-0 Monocryl and subcuticular 4-0 Vicryl. Dermabond was applied to both incisions.  COMPLICATIONS: None  FINDINGS: After catheter placement, the tip lies at the cavoatrial junction. The catheter aspirates normally and is ready for immediate use.  IMPRESSION: Placement of single lumen port a cath via right internal jugular vein. The catheter tip lies at the cavoatrial junction. A power injectable port a cath was placed and is ready for immediate use.   Electronically Signed   By: Aletta Edouard M.D.   On: 12/28/2014 15:02   Ir US Guide Vasc Access Right  12/28/2014   CLINICAL DATA:  Lymphoma and need for porta cath to begin  chemotherapy.  EXAM: IMPLANTED PORT A CATH PLACEMENT WITH ULTRASOUND AND FLUOROSCOPIC GUIDANCE  ANESTHESIA/SEDATION: 4.5 Mg IV Versed; 100 mcg IV Fentanyl  Total Moderate Sedation Time:  36 minutes.  Additional Medications: 3 g IV Ancef. As antibiotic prophylaxis, Ancef was ordered pre-procedure and administered intravenously within one hour of incision.  FLUOROSCOPY TIME:  18 seconds.  PROCEDURE: The procedure, risks, benefits, and alternatives were explained to the patient. Questions regarding the procedure were encouraged and answered. The patient understands and consents to the procedure. A time-out was performed prior to the procedure.  Ultrasound was used to confirm patency of the right internal jugular vein. The right neck and chest were prepped with chlorhexidine in a sterile fashion, and a sterile drape was applied covering the operative field. Maximum barrier sterile technique with sterile gowns and gloves were used for the procedure. Local anesthesia was provided with 1% lidocaine.  After creating a small venotomy incision, a 21 gauge needle was advanced into the right internal jugular vein under direct, real-time ultrasound guidance. Ultrasound image documentation was performed. After securing guidewire access, an 8 Fr dilator was placed. A J-wire was kinked to measure appropriate catheter length.  A subcutaneous port pocket was then created along the upper chest wall utilizing sharp and blunt dissection. Portable cautery was utilized. The pocket was irrigated with sterile saline.  A single lumen power injectable port was chosen for placement. The 8 Fr catheter was tunneled from the port pocket site to the venotomy incision. The port was placed in the pocket. External catheter was trimmed to appropriate length based on guidewire measurement.  At the venotomy, an 8 Fr peel-away sheath was placed over  a guidewire. The catheter was then placed through the sheath and the sheath removed. Final catheter  positioning was confirmed and documented with a fluoroscopic spot image. The port was accessed with a needle and aspirated and flushed with saline. The needle was removed.  The venotomy and port pocket incisions were closed with subcutaneous 3-0 Monocryl and subcuticular 4-0 Vicryl. Dermabond was applied to both incisions.  COMPLICATIONS: None  FINDINGS: After catheter placement, the tip lies at the cavoatrial junction. The catheter aspirates normally and is ready for immediate use.  IMPRESSION: Placement of single lumen port a cath via right internal jugular vein. The catheter tip lies at the cavoatrial junction. A power injectable port a cath was placed and is ready for immediate use.   Electronically Signed   By: Aletta Edouard M.D.   On: 12/28/2014 15:02    Oren Binet, MD  [facility] Pager:[phone]  If 7PM-7AM, please contact night-coverage [email or web address] Password [email or web address] 01/15/2015, 1:52 PM   LOS: 3 days

## 2015-01-16 ENCOUNTER — Encounter (HOSPITAL_COMMUNITY): Payer: BLUE CROSS/BLUE SHIELD

## 2015-01-16 LAB — BASIC METABOLIC PANEL
Anion gap: 10 (ref 5–15)
BUN: 24 mg/dL — ABNORMAL HIGH (ref 6–20)
CO2: 34 mmol/L — ABNORMAL HIGH (ref 22–32)
Calcium: 8.7 mg/dL — ABNORMAL LOW (ref 8.9–10.3)
Chloride: 94 mmol/L — ABNORMAL LOW (ref 101–111)
Creatinine, Ser: 0.78 mg/dL (ref 0.61–1.24)
GFR calc non Af Amer: >60 mL/min (ref >60)
Glucose, Bld: 214 mg/dL — ABNORMAL HIGH (ref 65–99)
Potassium: 4.4 mmol/L (ref 3.5–5.1)
Sodium: 138 mmol/L (ref 135–145)

## 2015-01-16 LAB — CBC
HEMATOCRIT: 41.1 % (ref 39.0–52.0)
Hemoglobin: 12.9 g/dL — ABNORMAL LOW (ref 13.0–17.0)
MCH: 31.8 pg (ref 26.0–34.0)
MCHC: 31.4 g/dL (ref 30.0–36.0)
MCV: 101.2 fL — ABNORMAL HIGH (ref 78.0–100.0)
Platelets: 124 10*3/uL — ABNORMAL LOW (ref 150–400)
RBC: 4.06 MIL/uL — ABNORMAL LOW (ref 4.22–5.81)
RDW: 14.3 % (ref 11.5–15.5)
WBC: 18.4 10*3/uL — AB (ref 4.0–10.5)

## 2015-01-16 LAB — HEPATITIS PANEL, ACUTE
HCV Ab: <0.1 s/co ratio — AB (ref 0.0–0.9)
Hep A IgM: NEGATIVE — AB
Hep B C IgM: NEGATIVE — AB
Hepatitis B Surface Ag: NEGATIVE — AB

## 2015-01-16 MED ORDER — POLYETHYLENE GLYCOL 3350 17 G PO PACK
17.0000 g | PACK | Freq: Every day | ORAL | Status: DC
  Administered 2015-01-16 – 2015-01-17 (×2): 17 g via ORAL
  Filled 2015-01-16 (×4): qty 1

## 2015-01-16 MED ORDER — BISACODYL 10 MG RE SUPP: 10.0000 mg | Freq: Every day | RECTAL | Status: DC | PRN

## 2015-01-16 MED ORDER — FLEET ENEMA 7-19 GM/118ML RE ENEM: 1.0000 | ENEMA | Freq: Every day | RECTAL | Status: DC | PRN

## 2015-01-16 NOTE — Progress Notes (Signed)
Patient says that he was popping his blisters and letting the water drain from them which creates a skin tear on his legs where is doing this. Lorrene Reid

## 2015-01-16 NOTE — Progress Notes (Signed)
Pt has refused CPAP for the night, RT to monitor and assess as needed.  

## 2015-01-16 NOTE — Progress Notes (Signed)
Patient refused CPAP tonight 

## 2015-01-16 NOTE — Progress Notes (Signed)
PATIENT DETAILS Name: [name] Age: 59 y.o. Sex: male Date of Birth: 27-Jun-1956 Admit Date: 01/12/2015 Admitting Physician Hosie Poisson, MD [id number] Plotnikov, MD  Subjective: Lower extremities-with some blistering in the right lower extremity but essentially almost same.  Assessment/Plan: Active Problems:  ? Bilateral lower extremity Cellulitis:  Doubt that this is cellulitis, suspect that this is more of a vasculitis/allergic reaction to his most recent chemotherapy. Infectious disease following, underwent punch biopsy on 6/2-pending results. Empirically started on Solu-Medrol. Lower extremity appears essentially the same as of yesterday with some more blistering-but some of the erythematous areas have begun to clear slightly. Unfortunately, no dermatology coverage available here in the hospital. This M.D. spoke with patient today again, offered to transfer to a tertiary center with dermatology coverage, patient still prefers to stay here in [place], and follow-up with his primary dermatologist Jarome Matin M.D.) at [facility] dermatology on discharge. This MD did speak with Dr. Ronnald Ramp over the phone on 6/3, ( after patient gave me permission -both verbal and written)-I sent him some pictures-Dr. Ronnald Ramp suggested that we continue steroids, add topical triamcinolone and await biopsy results. Dr. 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 Because of low platelet count and inflammation from stasis could have led to hemorrhage within the skin giving it dark appearance.  We will continue to follow closely, and if any deterioration in his rash/skin will again offer patient transferred to a tertiary center with dermatology coverage. We will continue to follow closely. Continue leg elevation, continue Diuretics, Solumedrol and IV Ancef. Bx results still pending.  History of lymphoma: Recent chemotherapy with Rituximab and  Bendamustine. Oncology following.  Thrombocytopenia: Likely secondary to recent chemotherapy.Improving upto 124K- CBC in a.m.  Leukocytosis: Likely from steroids.Will follow.  History of CAD status post CABG in 2005 and PCI in 2013: Continue aspirin/Plavix, statin and metoprolol. Currently without chest pain or shortness of breath.  History of carotid artery disease-status post left carotid endarterectomy in 2009:continue aspirin/Plavix and statin.  Chronic diastolic heart failure: Compensated. On IV Lasix to decrease swelling associated with cellulitis.K stable  History of heparin-induced thrombocytopenia: Avoid Lovenox/heparin  History of obstructive sleep apnea: Offer CPAP daily at bedtime-refuses  History of gout: Continue with allopurinol-currently stable.  GERD: Continue PPI  Disposition: Remain inpatient-home early next week-after bx results available  Antimicrobial agents  See below  Anti-infectives    Start     Dose/Rate Route Frequency Ordered Stop   01/15/15 1000  ceFAZolin (ANCEF) IVPB 1 g/50 mL premix     1 g 100 mL/hr over 30 Minutes Intravenous 3 times per day 01/15/15 0857     01/13/15 0800  vancomycin (VANCOCIN) 1,250 mg in sodium chloride 0.9 % 250 mL IVPB  Status:  Discontinued     1,250 mg 166.7 mL/hr over 90 Minutes Intravenous Every 12 hours 01/12/15 1919 01/15/15 0855   01/13/15 0400  piperacillin-tazobactam (ZOSYN) IVPB 3.375 g  Status:  Discontinued     3.375 g 12.5 mL/hr over 240 Minutes Intravenous Every 8 hours 01/12/15 1919 01/13/15 1134   01/12/15 2000  acyclovir (ZOVIRAX) tablet 400 mg     400 mg Oral Daily 01/12/15 1938     01/12/15 2000  vancomycin (VANCOCIN) 2,500 mg in sodium chloride 0.9 % 500 mL IVPB  Status:  Discontinued     2,500 mg 250 mL/hr over 120 Minutes Intravenous  Once 01/12/15  1855 01/12/15 1906   01/12/15 2000  vancomycin (VANCOCIN) 1,500 mg in sodium chloride 0.9 % 500 mL IVPB     1,500 mg 250 mL/hr over 120 Minutes  Intravenous  Once 01/12/15 1906 01/12/15 2146   01/12/15 1915  piperacillin-tazobactam (ZOSYN) IVPB 3.375 g     3.375 g 100 mL/hr over 30 Minutes Intravenous  Once 01/12/15 1851 01/12/15 2140   01/12/15 1900  vancomycin (VANCOCIN) IVPB 1000 mg/200 mL premix  Status:  Discontinued     1,000 mg 200 mL/hr over 60 Minutes Intravenous  Once 01/12/15 1851 01/12/15 1854   01/12/15 1600  vancomycin (VANCOCIN) IVPB 1000 mg/200 mL premix     1,000 mg 200 mL/hr over 60 Minutes Intravenous  Once 01/12/15 1559 01/12/15 1745      DVT Prophylaxis:  SCD's  Code Status: Full code   Family Communication None at bedside  Procedures: None  CONSULTS:  hematology/oncology  Time spent 35 minutes-Greater than 50% of this time was spent in counseling, explanation of diagnosis, planning of further management, and coordination of care.  MEDICATIONS: Scheduled Meds: . acyclovir  400 mg Oral Daily  . allopurinol  300 mg Oral Daily  . aspirin EC  81 mg Oral Daily  .  ceFAZolin (ANCEF) IV  1 g Intravenous 3 times per day  . clopidogrel  75 mg Oral Q breakfast  . furosemide  40 mg Intravenous Q12H  . ipratropium-albuterol  3 mL Nebulization TID  . lidocaine-prilocaine   Topical Once  . methylPREDNISolone (SOLU-MEDROL) injection  80 mg Intravenous Q12H  . metoprolol succinate  100 mg Oral Daily  . polyethylene glycol  17 g Oral Once  . potassium chloride  20 mEq Oral Daily  . senna-docusate  2 tablet Oral QHS  . simvastatin  20 mg Oral q1800  . triamcinolone ointment   Topical TID   Continuous Infusions:  PRN Meds:.acetaminophen **OR** acetaminophen, albuterol, clonazePAM, magnesium hydroxide, morphine injection, traMADol    PHYSICAL EXAM: Vital signs in last 24 hours: Filed Vitals:   01/15/15 2027 01/16/15 0505 01/16/15 0830 01/16/15 1305  BP: 135/52 156/66  135/61  Pulse: 70 70  77  Temp: 98.2 F (36.8 C) 97.8 F (36.6 C)  98.1 F (36.7 C)  TempSrc: Oral Oral  Oral  Resp: 20 20  18     Height:      Weight:      SpO2: 100% 95% 95% 96%    Weight change: 1.905 kg (4 lb 3.2 oz) Filed Weights   01/14/15 1036 01/15/15 0649 01/15/15 0950  Weight: 158.305 kg (349 lb) 159.2 kg (350 lb 15.6 oz) 160.21 kg (353 lb 3.2 oz)   Body mass index is 47.89 kg/(m^2).   Gen Exam: Awake and alert with clear speech.  Neck: Supple, No JVD.   Chest: B/L Clear.  No rales CVS: S1 S2 Regular, no murmurs.  Abdomen: soft, BS +, non tender, non distended.  Extremities:See pic below Neurologic: Non Focal.   Skin: see pics below from today Wounds: N/A.   6/4   Right Leg 6/4   6/4   6/4   Intake/Output from previous day:  Intake/Output Summary (Last 24 hours) at 01/16/15 1438 Last data filed at 01/16/15 1230  Gross per 24 hour  Intake   1440 ml  Output   1375 ml  Net     65 ml     LAB RESULTS: CBC  Recent Labs Lab 01/12/15 1355 01/14/15 0530 01/15/15 0500 01/16/15 0545  WBC  10.1 15.4* 21.7* 18.4*  HGB 14.7 13.5 13.3 12.9*  HCT 45.3 41.1 41.6 41.1  PLT 109* 91* 109* 124*  MCV 99.3 99.3 99.3 101.2*  MCH 32.2 32.6 31.7 31.8  MCHC 32.5 32.8 32.0 31.4  RDW 13.6 13.9 14.1 14.3    Chemistries   Recent Labs Lab 01/12/15 1355 01/13/15 1430 01/14/15 0530 01/15/15 0500 01/16/15 0545  NA 138 135 139 137 138  K 3.5 3.2* 3.5 3.9 4.4  CL 93* 91* 96* 93* 94*  CO2 33* 33* 34* 31 34*  GLUCOSE 118* 174* 177* 215* 214*  BUN 16 17 17  25* 24*  CREATININE 1.01 0.99 0.85 0.82 0.78  CALCIUM 8.6* 8.3* 8.7* 8.7* 8.7*    CBG: No results for input(s): GLUCAP in the last 168 hours.  GFR Estimated Creatinine Clearance: 153.6 mL/min (by C-G formula based on Cr of 0.78).  Coagulation profile No results for input(s): INR, PROTIME in the last 168 hours.  Cardiac Enzymes No results for input(s): CKMB, TROPONINI, MYOGLOBIN in the last 168 hours.  Invalid input(s): CK  Invalid input(s): POCBNP No results for input(s): DDIMER in the last 72 hours. No results for input(s):  HGBA1C in the last 72 hours. No results for input(s): CHOL, HDL, LDLCALC, TRIG, CHOLHDL, LDLDIRECT in the last 72 hours. No results for input(s): TSH, T4TOTAL, T3FREE, THYROIDAB in the last 72 hours.  Invalid input(s): FREET3 No results for input(s): VITAMINB12, FOLATE, FERRITIN, TIBC, IRON, RETICCTPCT in the last 72 hours. No results for input(s): LIPASE, AMYLASE in the last 72 hours.  Urine Studies No results for input(s): UHGB, CRYS in the last 72 hours.  Invalid input(s): UACOL, UAPR, USPG, UPH, UTP, UGL, UKET, UBIL, UNIT, UROB, ULEU, UEPI, UWBC, URBC, UBAC, CAST, UCOM, BILUA  MICROBIOLOGY: Recent Results (from the past 240 hour(s))  Culture, blood (routine x 2)     Status: None (Preliminary result)   Collection Time: 01/12/15  4:25 PM  Result Value Ref Range Status   Specimen Description BLOOD RIGHT ASSIST CONTROL  Final   Special Requests BOTTLES DRAWN AEROBIC AND ANAEROBIC BML  Final   Culture   Final           BLOOD CULTURE RECEIVED NO GROWTH TO DATE CULTURE WILL BE HELD FOR 5 DAYS BEFORE ISSUING A FINAL NEGATIVE REPORT Performed at [facility]    Report Status PENDING  Incomplete  Culture, blood (routine x 2)     Status: None (Preliminary result)   Collection Time: 01/12/15  4:32 PM  Result Value Ref Range Status   Specimen Description BLOOD LEFT ANTECUBITAL  Final   Special Requests BOTTLES DRAWN AEROBIC AND ANAEROBIC 5 CC EA  Final   Culture   Final           BLOOD CULTURE RECEIVED NO GROWTH TO DATE CULTURE WILL BE HELD FOR 5 DAYS BEFORE ISSUING A FINAL NEGATIVE REPORT Performed at [facility]    Report Status PENDING  Incomplete  Culture, blood (routine x 2)     Status: None (Preliminary result)   Collection Time: 01/14/15 11:20 AM  Result Value Ref Range Status   Specimen Description BLOOD RIGHT ARM  Final   Special Requests BOTTLES DRAWN AEROBIC AND ANAEROBIC 10CC  Final   Culture   Final           BLOOD CULTURE RECEIVED NO GROWTH TO DATE  CULTURE WILL BE HELD FOR 5 DAYS BEFORE ISSUING A FINAL NEGATIVE REPORT Performed at [facility]  Report Status PENDING  Incomplete  Culture, blood (routine x 2)     Status: None (Preliminary result)   Collection Time: 01/14/15 11:29 AM  Result Value Ref Range Status   Specimen Description BLOOD RIGHT HAND  Final   Special Requests BOTTLES DRAWN AEROBIC ONLY 5CC  Final   Culture   Final           BLOOD CULTURE RECEIVED NO GROWTH TO DATE CULTURE WILL BE HELD FOR 5 DAYS BEFORE ISSUING A FINAL NEGATIVE REPORT Performed at [facility]    Report Status PENDING  Incomplete    RADIOLOGY STUDIES/RESULTS: Nm Pet Image Initial (pi) Skull Base To Thigh  12/25/2014   CLINICAL DATA:  Initial treatment strategy for lymphoma.  EXAM: NUCLEAR MEDICINE PET SKULL BASE TO THIGH  TECHNIQUE: 16.0 mCi F-18 FDG was injected intravenously. Full-ring PET imaging was performed from the skull base to thigh after the radiotracer. CT data was obtained and used for attenuation correction and anatomic localization.  FASTING BLOOD GLUCOSE:  Value: 121 mg/dl  COMPARISON:  06/24/2009  FINDINGS: NECK  The superficial left supraclavicular lymph node measuring 4.4 by 4.6 cm on image 40 of series 4 has maximum standard uptake value 12.2, Deauville 5. There is a small amount of gas within this lesion suggesting recent biopsy.  Symmetric tonsillar activity. Small amount of activity superficial to the right submandibular gland is probably vascular. Probable sebaceous cyst along the right posterior lower neck, image 38 series 4, photopenic.  CHEST  Left upper prevascular lymph node measuring 1.5 cm in short axis on image 66 of series 4 has maximum standard uptake value 7.9, Deauville 4. A small right upper paratracheal lymph node measuring 0.9 cm in short axis on image 60 of series 4 has maximum standard uptake value 4.3, Deauville 3.  Calcified pleural plaques noted. Background vascular mediastinal activity approximately  3.6.  Other chest findings include coronary artery atherosclerosis, cardiomegaly, calcified pleural plaques (left greater than right), and bilateral airway thickening. Prior CABG.  ABDOMEN/PELVIS  Background hepatic activity 4.4. Portacaval lymph node measuring 1.6 cm in short axis on image 133 of series 4 has maximum standard uptake value 7.2, Deauville 4. A a lymph node adjacent to the left adrenal gland measuring 1.9 cm in short axis on image 123 of series 4 has maximum standard uptake value 6.7, Deauville 4. At the level of the left renal vessels, a left periaortic lymph node with maximum standard uptake value 7.1 measures 1.3 cm in short axis (image 136, series 4), Deauville 4.  Bilateral hypermetabolic inguinal lymph nodes are present. A right inguinal lymph node measuring 1.3 cm in short axis on image 218 of series 4 has maximum standard uptake value 4.2, Deauville 3  Other abdominal findings include cholelithiasis, bilateral nonobstructive nephrolithiasis, and aortoiliac atherosclerosis.  SKELETON  Heterogeneous marrow activity noted but without definite focal bony lesion. Incidental note is made of right antecubital activity, maximum standard uptake value 8.6, probably injection related (this is the site of injection. ). Bridging spurring of both sacroiliac joints noted.  IMPRESSION: 1. Scattered hypermetabolic adenopathy in the neck, chest, abdomen, and inguinal regions, compatible with active lymphoma. 2. Airway thickening is present, suggesting bronchitis or reactive airways disease. 3. Scattered heterogeneous marrow activity without a focal lesion, uncertain significance. 4. Other findings include calcified pleural plaques left greater than right ; prior CABG; cardiomegaly; cholelithiasis; and bilateral nonobstructive nephrolithiasis.   Electronically Signed   By: Van Clines M.D.   On: 12/25/2014 09:09  Ir Fluoro Guide Cv Line Right  12/28/2014   CLINICAL DATA:  Lymphoma and need for porta  cath to begin chemotherapy.  EXAM: IMPLANTED PORT A CATH PLACEMENT WITH ULTRASOUND AND FLUOROSCOPIC GUIDANCE  ANESTHESIA/SEDATION: 4.5 Mg IV Versed; 100 mcg IV Fentanyl  Total Moderate Sedation Time:  36 minutes.  Additional Medications: 3 g IV Ancef. As antibiotic prophylaxis, Ancef was ordered pre-procedure and administered intravenously within one hour of incision.  FLUOROSCOPY TIME:  18 seconds.  PROCEDURE: The procedure, risks, benefits, and alternatives were explained to the patient. Questions regarding the procedure were encouraged and answered. The patient understands and consents to the procedure. A time-out was performed prior to the procedure.  Ultrasound was used to confirm patency of the right internal jugular vein. The right neck and chest were prepped with chlorhexidine in a sterile fashion, and a sterile drape was applied covering the operative field. Maximum barrier sterile technique with sterile gowns and gloves were used for the procedure. Local anesthesia was provided with 1% lidocaine.  After creating a small venotomy incision, a 21 gauge needle was advanced into the right internal jugular vein under direct, real-time ultrasound guidance. Ultrasound image documentation was performed. After securing guidewire access, an 8 Fr dilator was placed. A J-wire was kinked to measure appropriate catheter length.  A subcutaneous port pocket was then created along the upper chest wall utilizing sharp and blunt dissection. Portable cautery was utilized. The pocket was irrigated with sterile saline.  A single lumen power injectable port was chosen for placement. The 8 Fr catheter was tunneled from the port pocket site to the venotomy incision. The port was placed in the pocket. External catheter was trimmed to appropriate length based on guidewire measurement.  At the venotomy, an 8 Fr peel-away sheath was placed over a guidewire. The catheter was then placed through the sheath and the sheath removed. Final  catheter positioning was confirmed and documented with a fluoroscopic spot image. The port was accessed with a needle and aspirated and flushed with saline. The needle was removed.  The venotomy and port pocket incisions were closed with subcutaneous 3-0 Monocryl and subcuticular 4-0 Vicryl. Dermabond was applied to both incisions.  COMPLICATIONS: None  FINDINGS: After catheter placement, the tip lies at the cavoatrial junction. The catheter aspirates normally and is ready for immediate use.  IMPRESSION: Placement of single lumen port a cath via right internal jugular vein. The catheter tip lies at the cavoatrial junction. A power injectable port a cath was placed and is ready for immediate use.   Electronically Signed   By: Aletta Edouard M.D.   On: 12/28/2014 15:02   Ir US Guide Vasc Access Right  12/28/2014   CLINICAL DATA:  Lymphoma and need for porta cath to begin chemotherapy.  EXAM: IMPLANTED PORT A CATH PLACEMENT WITH ULTRASOUND AND FLUOROSCOPIC GUIDANCE  ANESTHESIA/SEDATION: 4.5 Mg IV Versed; 100 mcg IV Fentanyl  Total Moderate Sedation Time:  36 minutes.  Additional Medications: 3 g IV Ancef. As antibiotic prophylaxis, Ancef was ordered pre-procedure and administered intravenously within one hour of incision.  FLUOROSCOPY TIME:  18 seconds.  PROCEDURE: The procedure, risks, benefits, and alternatives were explained to the patient. Questions regarding the procedure were encouraged and answered. The patient understands and consents to the procedure. A time-out was performed prior to the procedure.  Ultrasound was used to confirm patency of the right internal jugular vein. The right neck and chest were prepped with chlorhexidine in a sterile fashion, and a sterile  drape was applied covering the operative field. Maximum barrier sterile technique with sterile gowns and gloves were used for the procedure. Local anesthesia was provided with 1% lidocaine.  After creating a small venotomy incision, a 21 gauge  needle was advanced into the right internal jugular vein under direct, real-time ultrasound guidance. Ultrasound image documentation was performed. After securing guidewire access, an 8 Fr dilator was placed. A J-wire was kinked to measure appropriate catheter length.  A subcutaneous port pocket was then created along the upper chest wall utilizing sharp and blunt dissection. Portable cautery was utilized. The pocket was irrigated with sterile saline.  A single lumen power injectable port was chosen for placement. The 8 Fr catheter was tunneled from the port pocket site to the venotomy incision. The port was placed in the pocket. External catheter was trimmed to appropriate length based on guidewire measurement.  At the venotomy, an 8 Fr peel-away sheath was placed over a guidewire. The catheter was then placed through the sheath and the sheath removed. Final catheter positioning was confirmed and documented with a fluoroscopic spot image. The port was accessed with a needle and aspirated and flushed with saline. The needle was removed.  The venotomy and port pocket incisions were closed with subcutaneous 3-0 Monocryl and subcuticular 4-0 Vicryl. Dermabond was applied to both incisions.  COMPLICATIONS: None  FINDINGS: After catheter placement, the tip lies at the cavoatrial junction. The catheter aspirates normally and is ready for immediate use.  IMPRESSION: Placement of single lumen port a cath via right internal jugular vein. The catheter tip lies at the cavoatrial junction. A power injectable port a cath was placed and is ready for immediate use.   Electronically Signed   By: Aletta Edouard M.D.   On: 12/28/2014 15:02    Oren Binet, MD  [facility] Pager:[phone]  If 7PM-7AM, please contact night-coverage [email or web address] Password [email or web address] 01/16/2015, 2:38 PM   LOS: 4 days

## 2015-01-17 ENCOUNTER — Inpatient Hospital Stay (HOSPITAL_COMMUNITY): Payer: BLUE CROSS/BLUE SHIELD

## 2015-01-17 DIAGNOSIS — R0602 Shortness of breath: Secondary | ICD-10-CM

## 2015-01-17 LAB — BRAIN NATRIURETIC PEPTIDE: B NATRIURETIC PEPTIDE 5: 405.1 pg/mL — AB (ref 0.0–100.0)

## 2015-01-17 LAB — TROPONIN I: Troponin I: <0.03 ng/mL (ref <0.031)

## 2015-01-17 LAB — BASIC METABOLIC PANEL
ANION GAP: 10 (ref 5–15)
BUN: 26 mg/dL — ABNORMAL HIGH (ref 6–20)
CHLORIDE: 97 mmol/L — AB (ref 101–111)
CO2: 34 mmol/L — ABNORMAL HIGH (ref 22–32)
Calcium: 8.8 mg/dL — ABNORMAL LOW (ref 8.9–10.3)
Creatinine, Ser: 0.85 mg/dL (ref 0.61–1.24)
GFR calc Af Amer: >60 mL/min (ref >60)
GLUCOSE: 187 mg/dL — AB (ref 65–99)
Potassium: 4.6 mmol/L (ref 3.5–5.1)
Sodium: 141 mmol/L (ref 135–145)

## 2015-01-17 MED ORDER — FUROSEMIDE 10 MG/ML IJ SOLN
80.0000 mg | Freq: Once | INTRAMUSCULAR | Status: AC
  Administered 2015-01-17: 80 mg via INTRAVENOUS
  Filled 2015-01-17: qty 8

## 2015-01-17 MED ORDER — FUROSEMIDE 10 MG/ML IJ SOLN
60.0000 mg | Freq: Once | INTRAMUSCULAR | Status: DC
  Filled 2015-01-17: qty 6

## 2015-01-17 MED ORDER — IPRATROPIUM-ALBUTEROL 0.5-2.5 (3) MG/3ML IN SOLN
3.0000 mL | RESPIRATORY_TRACT | Status: DC
  Administered 2015-01-17: 3 mL via RESPIRATORY_TRACT
  Filled 2015-01-17: qty 3

## 2015-01-17 MED ORDER — METHYLPREDNISOLONE SODIUM SUCC 125 MG IJ SOLR
60.0000 mg | Freq: Two times a day (BID) | INTRAMUSCULAR | Status: DC
  Administered 2015-01-18 (×2): 60 mg via INTRAVENOUS
  Filled 2015-01-17 (×3): qty 0.96

## 2015-01-17 MED ORDER — FUROSEMIDE 10 MG/ML IJ SOLN
40.0000 mg | Freq: Two times a day (BID) | INTRAMUSCULAR | Status: DC
  Administered 2015-01-18: 40 mg via INTRAVENOUS
  Filled 2015-01-17: qty 4

## 2015-01-17 MED ORDER — POTASSIUM CHLORIDE CRYS ER 20 MEQ PO TBCR
20.0000 meq | EXTENDED_RELEASE_TABLET | Freq: Two times a day (BID) | ORAL | Status: DC
  Administered 2015-01-17 – 2015-01-19 (×4): 20 meq via ORAL
  Filled 2015-01-17 (×4): qty 1

## 2015-01-17 MED ORDER — FONDAPARINUX SODIUM 2.5 MG/0.5ML ~~LOC~~ SOLN
2.5000 mg | SUBCUTANEOUS | Status: AC
  Administered 2015-01-17: 2.5 mg via SUBCUTANEOUS
  Filled 2015-01-17: qty 0.5

## 2015-01-17 MED ORDER — IPRATROPIUM-ALBUTEROL 0.5-2.5 (3) MG/3ML IN SOLN
3.0000 mL | Freq: Three times a day (TID) | RESPIRATORY_TRACT | Status: DC
  Administered 2015-01-18 (×3): 3 mL via RESPIRATORY_TRACT
  Filled 2015-01-17 (×3): qty 3

## 2015-01-17 MED ORDER — HYDRALAZINE HCL 25 MG PO TABS
25.0000 mg | ORAL_TABLET | Freq: Two times a day (BID) | ORAL | Status: DC
  Administered 2015-01-17: 25 mg via ORAL
  Filled 2015-01-17 (×3): qty 1

## 2015-01-17 MED ORDER — SODIUM CHLORIDE 0.9 % IJ SOLN
10.0000 mL | INTRAMUSCULAR | Status: DC | PRN
  Administered 2015-01-17: 10 mL
  Administered 2015-01-18: 20 mL
  Administered 2015-01-18 – 2015-01-19 (×3): 10 mL
  Filled 2015-01-17 (×5): qty 40

## 2015-01-17 MED ORDER — FUROSEMIDE 10 MG/ML IJ SOLN
60.0000 mg | Freq: Two times a day (BID) | INTRAMUSCULAR | Status: DC
  Filled 2015-01-17: qty 6

## 2015-01-17 NOTE — Progress Notes (Addendum)
PATIENT DETAILS Name: [name] Age: 59 y.o. Sex: male Date of Birth: 10-28-1955 Admit Date: 01/12/2015 Admitting Physician Hosie Poisson, MD MLJ:[id number] Plotnikov, MD  Subjective: Right lower extremity blisters have opened to superficial ulcerations-but rash seems to clearing slowly-swelling somewhat improved as well.   Assessment/Plan: Active Problems:  ? Bilateral lower extremity Cellulitis:  Doubt that this is cellulitis, suspect that this is more of a vasculitis/allergic reaction to his most recent chemotherapy. Infectious disease following, underwent punch biopsy on 6/2-pending results. Empirically started on Solu-Medrol. Lower extremity appears to have improved some what-blisters in the RLE have opened into superficial ulcerations. Some erythema has started to clear.  Note- no dermatology coverage available here in the hospital. This M.D. spoke with patient on numerous occasions over the past few days, offered to transfer to a tertiary center with dermatology coverage, patient still prefers to stay here in [place], and follow-up with his primary dermatologist Jarome Matin M.D.) at [facility] dermatology on discharge. This MD did speak with Dr. Ronnald Ramp over the phone on 6/3, (after patient gave me permission -both verbal and written)-I sent him some pictures-Dr. Ronnald Ramp suggested that we continue steroids, add topical triamcinolone and await biopsy results. Dr. 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 Because of low platelet count and inflammation from stasis could have led to hemorrhage within the skin giving it dark appearance.  We will continue to follow closely, and if any deterioration in his rash/skin will again offer patient transferred to a tertiary center with dermatology coverage. However seems to be improving slowly.We will continue to follow closely. Continue leg elevation, continue Diuretics, IV Ancef,  Solumedrol-but will decrease to 60 mg BID. Bx results still pending.  History of lymphoma: Recent chemotherapy with Rituximab and Bendamustine. Oncology following-will defer chemotherapy agent/timing to Onc-may need to change chemo agent given above  Thrombocytopenia: Likely secondary to recent chemotherapy.Improving upto 124K- check CBC periodically  Leukocytosis: Likely from steroids.Will follow.  History of CAD status post CABG in 2005 and PCI in 2013: Continue aspirin/Plavix, statin and metoprolol. Currently without chest pain or shortness of breath.  History of carotid artery disease-status post left carotid endarterectomy in 2009:continue aspirin/Plavix and statin.  Chronic diastolic heart failure: Compensated. On IV Lasix to decrease swelling associated with cellulitis.K stable  History of heparin-induced thrombocytopenia: Avoid Lovenox/heparin  History of obstructive sleep apnea: Offer CPAP daily at bedtime-refuses  History of gout: Continue with allopurinol-currently stable.  HTN:BP uncontrolled-continue Metoprolol-add hydralazine  GERD: Continue PPI  Disposition: Remain inpatient-home 1-2 days-after bx results available  Antimicrobial agents  See below  Anti-infectives    Start     Dose/Rate Route Frequency Ordered Stop   01/15/15 1000  ceFAZolin (ANCEF) IVPB 1 g/50 mL premix     1 g 100 mL/hr over 30 Minutes Intravenous 3 times per day 01/15/15 0857     01/13/15 0800  vancomycin (VANCOCIN) 1,250 mg in sodium chloride 0.9 % 250 mL IVPB  Status:  Discontinued     1,250 mg 166.7 mL/hr over 90 Minutes Intravenous Every 12 hours 01/12/15 1919 01/15/15 0855   01/13/15 0400  piperacillin-tazobactam (ZOSYN) IVPB 3.375 g  Status:  Discontinued     3.375 g 12.5 mL/hr over 240 Minutes Intravenous Every 8 hours 01/12/15 1919 01/13/15 1134   01/12/15 2000  acyclovir (ZOVIRAX) tablet 400 mg     400 mg Oral Daily 01/12/15 1938  01/12/15 2000  vancomycin (VANCOCIN) 2,500 mg  in sodium chloride 0.9 % 500 mL IVPB  Status:  Discontinued     2,500 mg 250 mL/hr over 120 Minutes Intravenous  Once 01/12/15 1855 01/12/15 1906   01/12/15 2000  vancomycin (VANCOCIN) 1,500 mg in sodium chloride 0.9 % 500 mL IVPB     1,500 mg 250 mL/hr over 120 Minutes Intravenous  Once 01/12/15 1906 01/12/15 2146   01/12/15 1915  piperacillin-tazobactam (ZOSYN) IVPB 3.375 g     3.375 g 100 mL/hr over 30 Minutes Intravenous  Once 01/12/15 1851 01/12/15 2140   01/12/15 1900  vancomycin (VANCOCIN) IVPB 1000 mg/200 mL premix  Status:  Discontinued     1,000 mg 200 mL/hr over 60 Minutes Intravenous  Once 01/12/15 1851 01/12/15 1854   01/12/15 1600  vancomycin (VANCOCIN) IVPB 1000 mg/200 mL premix     1,000 mg 200 mL/hr over 60 Minutes Intravenous  Once 01/12/15 1559 01/12/15 1745      DVT Prophylaxis:  SCD's  Code Status: Full code   Family Communication None at bedside  Procedures: None  CONSULTS:  hematology/oncology  Time spent 35 minutes-Greater than 50% of this time was spent in counseling, explanation of diagnosis, planning of further management, and coordination of care.  MEDICATIONS: Scheduled Meds: . acyclovir  400 mg Oral Daily  . allopurinol  300 mg Oral Daily  . aspirin EC  81 mg Oral Daily  .  ceFAZolin (ANCEF) IV  1 g Intravenous 3 times per day  . clopidogrel  75 mg Oral Q breakfast  . furosemide  40 mg Intravenous Q12H  . hydrALAZINE  25 mg Oral BID  . ipratropium-albuterol  3 mL Nebulization TID  . lidocaine-prilocaine   Topical Once  . [START ON 01/18/2015] methylPREDNISolone (SOLU-MEDROL) injection  60 mg Intravenous Q12H  . metoprolol succinate  100 mg Oral Daily  . polyethylene glycol  17 g Oral Daily  . potassium chloride  20 mEq Oral Daily  . senna-docusate  2 tablet Oral QHS  . simvastatin  20 mg Oral q1800  . triamcinolone ointment   Topical TID   Continuous Infusions:  PRN Meds:.acetaminophen **OR** acetaminophen, albuterol, bisacodyl,  clonazePAM, magnesium hydroxide, morphine injection, sodium phosphate, traMADol    PHYSICAL EXAM: Vital signs in last 24 hours: Filed Vitals:   01/16/15 2017 01/16/15 2040 01/17/15 0509 01/17/15 0834  BP:  164/69 182/82   Pulse:  62 71   Temp:  98 F (36.7 C) 98.1 F (36.7 C)   TempSrc:  Oral Oral   Resp:  18 18   Height:      Weight:      SpO2: 86% 97% 90% 90%    Weight change:  Filed Weights   01/14/15 1036 01/15/15 0649 01/15/15 0950  Weight: 158.305 kg (349 lb) 159.2 kg (350 lb 15.6 oz) 160.21 kg (353 lb 3.2 oz)   Body mass index is 47.89 kg/(m^2).   Gen Exam: Awake and alert with clear speech.  Neck: Supple, No JVD.   Chest: B/L Clear.  No rales +rhonchi scattered CVS: S1 S2 Regular, no murmurs.  Abdomen: soft, BS +, non tender, non distended.  Extremities:See pic below Neurologic: Non Focal.   Skin: see pics below from today Wounds: N/A.    Right Leg 6/5   Right Leg 6/5   Left leg 6/5  Left leg 6/5  Left leg 6/5   Intake/Output from previous day:  Intake/Output Summary (Last 24 hours) at 01/17/15 1140 Last  data filed at 01/17/15 0900  Gross per 24 hour  Intake   1320 ml  Output   3001 ml  Net  -1681 ml     LAB RESULTS: CBC  Recent Labs Lab 01/12/15 1355 01/14/15 0530 01/15/15 0500 01/16/15 0545  WBC 10.1 15.4* 21.7* 18.4*  HGB 14.7 13.5 13.3 12.9*  HCT 45.3 41.1 41.6 41.1  PLT 109* 91* 109* 124*  MCV 99.3 99.3 99.3 101.2*  MCH 32.2 32.6 31.7 31.8  MCHC 32.5 32.8 32.0 31.4  RDW 13.6 13.9 14.1 14.3    Chemistries   Recent Labs Lab 01/13/15 1430 01/14/15 0530 01/15/15 0500 01/16/15 0545 01/17/15 0550  NA 135 139 137 138 141  K 3.2* 3.5 3.9 4.4 4.6  CL 91* 96* 93* 94* 97*  CO2 33* 34* 31 34* 34*  GLUCOSE 174* 177* 215* 214* 187*  BUN 17 17 25* 24* 26*  CREATININE 0.99 0.85 0.82 0.78 0.85  CALCIUM 8.3* 8.7* 8.7* 8.7* 8.8*    CBG: No results for input(s): GLUCAP in the last 168 hours.  GFR Estimated Creatinine  Clearance: 144.6 mL/min (by C-G formula based on Cr of 0.85).  Coagulation profile No results for input(s): INR, PROTIME in the last 168 hours.  Cardiac Enzymes No results for input(s): CKMB, TROPONINI, MYOGLOBIN in the last 168 hours.  Invalid input(s): CK  Invalid input(s): POCBNP No results for input(s): DDIMER in the last 72 hours. No results for input(s): HGBA1C in the last 72 hours. No results for input(s): CHOL, HDL, LDLCALC, TRIG, CHOLHDL, LDLDIRECT in the last 72 hours. No results for input(s): TSH, T4TOTAL, T3FREE, THYROIDAB in the last 72 hours.  Invalid input(s): FREET3 No results for input(s): VITAMINB12, FOLATE, FERRITIN, TIBC, IRON, RETICCTPCT in the last 72 hours. No results for input(s): LIPASE, AMYLASE in the last 72 hours.  Urine Studies No results for input(s): UHGB, CRYS in the last 72 hours.  Invalid input(s): UACOL, UAPR, USPG, UPH, UTP, UGL, UKET, UBIL, UNIT, UROB, ULEU, UEPI, UWBC, URBC, UBAC, CAST, UCOM, BILUA  MICROBIOLOGY: Recent Results (from the past 240 hour(s))  Culture, blood (routine x 2)     Status: None (Preliminary result)   Collection Time: 01/12/15  4:25 PM  Result Value Ref Range Status   Specimen Description BLOOD RIGHT ASSIST CONTROL  Final   Special Requests BOTTLES DRAWN AEROBIC AND ANAEROBIC BML  Final   Culture   Final           BLOOD CULTURE RECEIVED NO GROWTH TO DATE CULTURE WILL BE HELD FOR 5 DAYS BEFORE ISSUING A FINAL NEGATIVE REPORT Performed at [facility]    Report Status PENDING  Incomplete  Culture, blood (routine x 2)     Status: None (Preliminary result)   Collection Time: 01/12/15  4:32 PM  Result Value Ref Range Status   Specimen Description BLOOD LEFT ANTECUBITAL  Final   Special Requests BOTTLES DRAWN AEROBIC AND ANAEROBIC 5 CC EA  Final   Culture   Final           BLOOD CULTURE RECEIVED NO GROWTH TO DATE CULTURE WILL BE HELD FOR 5 DAYS BEFORE ISSUING A FINAL NEGATIVE REPORT Performed at [facility]    Report Status PENDING  Incomplete  Fungus culture, blood     Status: None (Preliminary result)   Collection Time: 01/14/15 11:20 AM  Result Value Ref Range Status   Specimen Description BLOOD RIGHT ARM  Final   Special Requests BOTTLES DRAWN AEROBIC ONLY  10CC  Final   Culture   Final    NO FUNGUS ISOLATED;CULTURE IN PROGRESS FOR 7 DAYS Note: Culture results may be compromised due to an excessive volume of blood received in culture bottles. Performed at [facility]    Report Status PENDING  Incomplete  Culture, blood (routine x 2)     Status: None (Preliminary result)   Collection Time: 01/14/15 11:20 AM  Result Value Ref Range Status   Specimen Description BLOOD RIGHT ARM  Final   Special Requests BOTTLES DRAWN AEROBIC AND ANAEROBIC 10CC  Final   Culture   Final           BLOOD CULTURE RECEIVED NO GROWTH TO DATE CULTURE WILL BE HELD FOR 5 DAYS BEFORE ISSUING A FINAL NEGATIVE REPORT Performed at [facility]    Report Status PENDING  Incomplete  Culture, blood (routine x 2)     Status: None (Preliminary result)   Collection Time: 01/14/15 11:29 AM  Result Value Ref Range Status   Specimen Description BLOOD RIGHT HAND  Final   Special Requests BOTTLES DRAWN AEROBIC ONLY 5CC  Final   Culture   Final           BLOOD CULTURE RECEIVED NO GROWTH TO DATE CULTURE WILL BE HELD FOR 5 DAYS BEFORE ISSUING A FINAL NEGATIVE REPORT Performed at [facility]    Report Status PENDING  Incomplete    RADIOLOGY STUDIES/RESULTS: Nm Pet Image Initial (pi) Skull Base To Thigh  12/25/2014   CLINICAL DATA:  Initial treatment strategy for lymphoma.  EXAM: NUCLEAR MEDICINE PET SKULL BASE TO THIGH  TECHNIQUE: 16.0 mCi F-18 FDG was injected intravenously. Full-ring PET imaging was performed from the skull base to thigh after the radiotracer. CT data was obtained and used for attenuation correction and anatomic localization.  FASTING BLOOD GLUCOSE:  Value: 121 mg/dl   COMPARISON:  06/24/2009  FINDINGS: NECK  The superficial left supraclavicular lymph node measuring 4.4 by 4.6 cm on image 40 of series 4 has maximum standard uptake value 12.2, Deauville 5. There is a small amount of gas within this lesion suggesting recent biopsy.  Symmetric tonsillar activity. Small amount of activity superficial to the right submandibular gland is probably vascular. Probable sebaceous cyst along the right posterior lower neck, image 38 series 4, photopenic.  CHEST  Left upper prevascular lymph node measuring 1.5 cm in short axis on image 66 of series 4 has maximum standard uptake value 7.9, Deauville 4. A small right upper paratracheal lymph node measuring 0.9 cm in short axis on image 60 of series 4 has maximum standard uptake value 4.3, Deauville 3.  Calcified pleural plaques noted. Background vascular mediastinal activity approximately 3.6.  Other chest findings include coronary artery atherosclerosis, cardiomegaly, calcified pleural plaques (left greater than right), and bilateral airway thickening. Prior CABG.  ABDOMEN/PELVIS  Background hepatic activity 4.4. Portacaval lymph node measuring 1.6 cm in short axis on image 133 of series 4 has maximum standard uptake value 7.2, Deauville 4. A a lymph node adjacent to the left adrenal gland measuring 1.9 cm in short axis on image 123 of series 4 has maximum standard uptake value 6.7, Deauville 4. At the level of the left renal vessels, a left periaortic lymph node with maximum standard uptake value 7.1 measures 1.3 cm in short axis (image 136, series 4), Deauville 4.  Bilateral hypermetabolic inguinal lymph nodes are present. A right inguinal lymph node measuring 1.3 cm in short axis on image 218  of series 4 has maximum standard uptake value 4.2, Deauville 3  Other abdominal findings include cholelithiasis, bilateral nonobstructive nephrolithiasis, and aortoiliac atherosclerosis.  SKELETON  Heterogeneous marrow activity noted but without definite  focal bony lesion. Incidental note is made of right antecubital activity, maximum standard uptake value 8.6, probably injection related (this is the site of injection. ). Bridging spurring of both sacroiliac joints noted.  IMPRESSION: 1. Scattered hypermetabolic adenopathy in the neck, chest, abdomen, and inguinal regions, compatible with active lymphoma. 2. Airway thickening is present, suggesting bronchitis or reactive airways disease. 3. Scattered heterogeneous marrow activity without a focal lesion, uncertain significance. 4. Other findings include calcified pleural plaques left greater than right ; prior CABG; cardiomegaly; cholelithiasis; and bilateral nonobstructive nephrolithiasis.   Electronically Signed   By: Van Clines M.D.   On: 12/25/2014 09:09   Ir Fluoro Guide Cv Line Right  12/28/2014   CLINICAL DATA:  Lymphoma and need for porta cath to begin chemotherapy.  EXAM: IMPLANTED PORT A CATH PLACEMENT WITH ULTRASOUND AND FLUOROSCOPIC GUIDANCE  ANESTHESIA/SEDATION: 4.5 Mg IV Versed; 100 mcg IV Fentanyl  Total Moderate Sedation Time:  36 minutes.  Additional Medications: 3 g IV Ancef. As antibiotic prophylaxis, Ancef was ordered pre-procedure and administered intravenously within one hour of incision.  FLUOROSCOPY TIME:  18 seconds.  PROCEDURE: The procedure, risks, benefits, and alternatives were explained to the patient. Questions regarding the procedure were encouraged and answered. The patient understands and consents to the procedure. A time-out was performed prior to the procedure.  Ultrasound was used to confirm patency of the right internal jugular vein. The right neck and chest were prepped with chlorhexidine in a sterile fashion, and a sterile drape was applied covering the operative field. Maximum barrier sterile technique with sterile gowns and gloves were used for the procedure. Local anesthesia was provided with 1% lidocaine.  After creating a small venotomy incision, a 21 gauge  needle was advanced into the right internal jugular vein under direct, real-time ultrasound guidance. Ultrasound image documentation was performed. After securing guidewire access, an 8 Fr dilator was placed. A J-wire was kinked to measure appropriate catheter length.  A subcutaneous port pocket was then created along the upper chest wall utilizing sharp and blunt dissection. Portable cautery was utilized. The pocket was irrigated with sterile saline.  A single lumen power injectable port was chosen for placement. The 8 Fr catheter was tunneled from the port pocket site to the venotomy incision. The port was placed in the pocket. External catheter was trimmed to appropriate length based on guidewire measurement.  At the venotomy, an 8 Fr peel-away sheath was placed over a guidewire. The catheter was then placed through the sheath and the sheath removed. Final catheter positioning was confirmed and documented with a fluoroscopic spot image. The port was accessed with a needle and aspirated and flushed with saline. The needle was removed.  The venotomy and port pocket incisions were closed with subcutaneous 3-0 Monocryl and subcuticular 4-0 Vicryl. Dermabond was applied to both incisions.  COMPLICATIONS: None  FINDINGS: After catheter placement, the tip lies at the cavoatrial junction. The catheter aspirates normally and is ready for immediate use.  IMPRESSION: Placement of single lumen port a cath via right internal jugular vein. The catheter tip lies at the cavoatrial junction. A power injectable port a cath was placed and is ready for immediate use.   Electronically Signed   By: Aletta Edouard M.D.   On: 12/28/2014 15:02   Ir US  Guide Vasc Access Right  12/28/2014   CLINICAL DATA:  Lymphoma and need for porta cath to begin chemotherapy.  EXAM: IMPLANTED PORT A CATH PLACEMENT WITH ULTRASOUND AND FLUOROSCOPIC GUIDANCE  ANESTHESIA/SEDATION: 4.5 Mg IV Versed; 100 mcg IV Fentanyl  Total Moderate Sedation Time:  36  minutes.  Additional Medications: 3 g IV Ancef. As antibiotic prophylaxis, Ancef was ordered pre-procedure and administered intravenously within one hour of incision.  FLUOROSCOPY TIME:  18 seconds.  PROCEDURE: The procedure, risks, benefits, and alternatives were explained to the patient. Questions regarding the procedure were encouraged and answered. The patient understands and consents to the procedure. A time-out was performed prior to the procedure.  Ultrasound was used to confirm patency of the right internal jugular vein. The right neck and chest were prepped with chlorhexidine in a sterile fashion, and a sterile drape was applied covering the operative field. Maximum barrier sterile technique with sterile gowns and gloves were used for the procedure. Local anesthesia was provided with 1% lidocaine.  After creating a small venotomy incision, a 21 gauge needle was advanced into the right internal jugular vein under direct, real-time ultrasound guidance. Ultrasound image documentation was performed. After securing guidewire access, an 8 Fr dilator was placed. A J-wire was kinked to measure appropriate catheter length.  A subcutaneous port pocket was then created along the upper chest wall utilizing sharp and blunt dissection. Portable cautery was utilized. The pocket was irrigated with sterile saline.  A single lumen power injectable port was chosen for placement. The 8 Fr catheter was tunneled from the port pocket site to the venotomy incision. The port was placed in the pocket. External catheter was trimmed to appropriate length based on guidewire measurement.  At the venotomy, an 8 Fr peel-away sheath was placed over a guidewire. The catheter was then placed through the sheath and the sheath removed. Final catheter positioning was confirmed and documented with a fluoroscopic spot image. The port was accessed with a needle and aspirated and flushed with saline. The needle was removed.  The venotomy and port  pocket incisions were closed with subcutaneous 3-0 Monocryl and subcuticular 4-0 Vicryl. Dermabond was applied to both incisions.  COMPLICATIONS: None  FINDINGS: After catheter placement, the tip lies at the cavoatrial junction. The catheter aspirates normally and is ready for immediate use.  IMPRESSION: Placement of single lumen port a cath via right internal jugular vein. The catheter tip lies at the cavoatrial junction. A power injectable port a cath was placed and is ready for immediate use.   Electronically Signed   By: Aletta Edouard M.D.   On: 12/28/2014 15:02    Oren Binet, MD  [facility] Pager:[phone]  If 7PM-7AM, please contact night-coverage [email or web address] Password [email or web address] 01/17/2015, 11:40 AM   LOS: 5 days

## 2015-01-17 NOTE — Consult Note (Addendum)
WOC wound consult note Reason for Consult: LE wound, weeping. Seen by my partner earlier this week, see her notes. Further discussion with patient and his wife reports he wears compression stockings at home on a PRN basis, he uses ACE wraps sometimes. He sits with legs down to work currently.  He legs have evidence of hemosiderin staining and he also is noted to have some ruptured bulla.  Bedside nursing reports he is "picking" at them.  He has had two punch bx per surgery to further determine etiology.  Currently being treated by oncology for lymphoma  Wound type: ruptured bulla, two open areas pretibial RLE, LLE intact with cellulitis   Drainage (amount, consistency, odor) no active weeping noted, patient in bed with legs elevated  Periwound: hemosiderin staining Dressing procedure/placement/frequency: Add xeroform gauze to the open areas on the RLE for antibacterial effects, wrap with kerlix, change daily for ongoing assessment of the leg.     [facility] team will follow along with you for weekly wound assessments.  Please notify me of any acute changes in the wounds or any new areas of concerns Para March RN,CWOCN [phone]

## 2015-01-17 NOTE — Progress Notes (Signed)
[facility] for Infectious Disease    Date of Admission:  01/12/2015   Total days of antibiotics 6        Day 3 cefazolin        Day 6 acyclovir           ID: [name] is a 59 y.o. 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 Concerned "blisters popping". He also feels short of breath x 2 days  cxr suggestive of pulm edema/chf with increased pulmonary vascularture  Medications:  . acyclovir  400 mg Oral Daily  . allopurinol  300 mg Oral Daily  . aspirin EC  81 mg Oral Daily  .  ceFAZolin (ANCEF) IV  1 g Intravenous 3 times per day  . clopidogrel  75 mg Oral Q breakfast  . fondaparinux (ARIXTRA) injection  2.5 mg Subcutaneous Every 24 Hours  . furosemide  60 mg Intravenous Once  . [START ON 01/18/2015] furosemide  60 mg Intravenous Q12H  . hydrALAZINE  25 mg Oral BID  . ipratropium-albuterol  3 mL Nebulization Q4H  . lidocaine-prilocaine   Topical Once  . [START ON 01/18/2015] methylPREDNISolone (SOLU-MEDROL) injection  60 mg Intravenous Q12H  . metoprolol succinate  100 mg Oral Daily  . polyethylene glycol  17 g Oral Daily  . potassium chloride  20 mEq Oral Daily  . senna-docusate  2 tablet Oral QHS  . simvastatin  20 mg Oral q1800  . triamcinolone ointment   Topical TID    Objective: Vital signs in last 24 hours: Temp:  [98 F (36.7 C)-98.1 F (36.7 C)] 98 F (36.7 C) (06/05 1330) Pulse Rate:  [62-71] 66 (06/05 1330) Resp:  [18-20] 20 (06/05 1330) BP: (140-182)/(69-82)  140/73 mmHg (06/05 1330) SpO2:  [86 %-97 %] 92 % (06/05 1425)  Physical Exam  Constitutional: He is oriented to person, place, and time. He appears well-developed and well-nourished. No distress.  HENT:  Mouth/Throat: Oropharynx is clear and moist. No oropharyngeal exudate.  Cardiovascular: Normal rate, regular rhythm and normal heart sounds. Exam reveals no gallop and no friction rub.  No murmur heard.  Pulmonary/Chest: tachypneic,Effort normal and breath sounds normal. No respiratory distress. He has no wheezes.  Abdominal: Soft. Bowel sounds are normal. He exhibits no distension. There is no tenderness. Protuberant abd Neurological: He is alert and oriented to person, place, and time.  Skin: lower extremity have trace edema. 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 Right lower leg has 3 areas of blisters that have been unroofed Psychiatric: He has a normal mood and affect. His behavior is normal.    Lab Results  Recent Labs  01/15/15 0500 01/16/15 0545 01/17/15 0550  WBC 21.7* 18.4*  --   HGB 13.3 12.9*  --   HCT 41.6 41.1  --   NA 137 138 141  K 3.9 4.4 4.6  CL 93* 94* 97*  CO2 31 34* 34*  BUN 25* 24* 26*  CREATININE 0.82 0.78 0.85  Liver Panel  Recent Labs  01/15/15 0500  PROT 6.6  ALBUMIN 3.1*  AST 22  ALT 27  ALKPHOS 57  BILITOT 0.6   Sedimentation Rate  Recent Labs  01/15/15 0500  ESRSEDRATE 38*   C-Reactive Protein  Recent Labs  01/15/15 0500  CRP 8.3*    Microbiology: 6/2 blood cx ngtd 5/31 blood cx ngtd Studies/Results: Dg Chest 2 View  01/17/2015   CLINICAL DATA:  Shortness of breath today, BILATERAL lower extremity swelling after chemotherapy last Friday, history lymphoma, COPD, MI, CHF, diabetes, hypertension, GERD  EXAM: CHEST  2 VIEW  COMPARISON:  09/13/2011  FINDINGS: RIGHT jugular Port-A-Cath with tip projecting over SVC.  Enlargement of cardiac silhouette post CABG.  Pulmonary vascular congestion.  Interstitial infiltrates  bilaterally favor pulmonary edema and CHF.  Scattered mid lung scarring and underlying emphysematous changes.  No acute infiltrate, pleural effusion or pneumothorax.  IMPRESSION: Probable CHF superimposed with COPD.   Electronically Signed   By: Lavonia Dana M.D.   On: 01/17/2015 15:13     Assessment/Plan: Drug induced vasculitis = continue with steroids  Pulmonary vascular congestion = likely causing his shortness of breath. 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 Recommend 7days of treatment  Baxter Flattery [facility] for Infectious Diseases Cell: [phone] Pager: [phone]  01/17/2015, 3:44 PM

## 2015-01-17 NOTE — Progress Notes (Addendum)
Has developed worsening SOB this afternoon-mostly on exertion. +Orthopnea. On IV Lasix 40 mg IV BID. Already on Solumedrol and Bronchodilators. Drinking a lot of fluid. On Exam-good air entry-is wheezing CXR:+CHF Suspect Acute Diastolic CHF-likely secondary to excessive fluid intake/steroids/acute illness Plan:Give one dose of IV Lasix 80 mg today, continue with Lasix 40 mg BID for now-will reassess vol status/lytes in am. Check 12 lead EKG, troponin, move to telemetry.Restrict fluid. Updated patient and spouse.

## 2015-01-17 NOTE — Progress Notes (Signed)
Has arrived from 3rd floor. AAOX3 . O2 at 2 L . Orientation to room given. Wife at bedside

## 2015-01-17 NOTE — Progress Notes (Signed)
[name]   DOB:29-Mar-1956   #:[id number]    Subjective: He feels well. Denies any fevers or chills. Some of the blisters on the right lower extremity has opened and he is draining serosanguineous fluid. His overall body redness and rash receding. His pain appears to be under control. He denies itchiness. His main complaint today is shortness of breath on minimal exertion. He felt that he has increased work of breathing. Denies cough.  Objective:  Filed Vitals:   01/17/15 0509  BP: 182/82  Pulse: 71  Temp: 98.1 F (36.7 C)  Resp: 18     Intake/Output Summary (Last 24 hours) at 01/17/15 1429 Last data filed at 01/17/15 0900  Gross per 24 hour  Intake    960 ml  Output   2701 ml  Net  -1741 ml    GENERAL:alert, no distress and comfortable SKIN:His skin redness has resolved. There were blisters in his legs have open with serosanguineous discharge. Overall, the cellulitis/skin rash are improving.  EYES: normal, Conjunctiva are pink and non-injected, sclera clear OROPHARYNX:no exudate, no erythema and lips, buccal mucosa, and tongue normal  NECK: supple, thyroid normal size, non-tender, without nodularity LYMPH:  no palpable lymphadenopathy in the cervical, axillary or inguinal LUNGS:No increased work of breathing. He had bilateral wheezes throughout. HEART: regular rate & rhythm and no murmurswith moderate bilateral  lower extremity edema ABDOMEN:abdomen soft, non-tender and normal bowel sounds Musculoskeletal:no cyanosis of digits and no clubbing  NEURO: alert & oriented x 3 with fluent speech, no focal motor/sensory deficits   Labs:  Lab Results  Component Value Date   WBC 18.4* 01/16/2015   HGB 12.9* 01/16/2015   HCT 41.1 01/16/2015   MCV 101.2* 01/16/2015   PLT 124* 01/16/2015   NEUTROABS 8.1* 12/28/2014    Lab Results  Component Value Date   NA 141 01/17/2015   K 4.6 01/17/2015   CL 97* 01/17/2015   CO2 34* 01/17/2015    Assessment & Plan:   Follicular Lymphoma, Grade 3a S/p Cycle 1 D1 on 5/19 with Rituximab and Bendamustine and every 28 days Continue supportive care. I plan to present his case at the next hematology tumor Board on Tuesday morning for further discussion about plan of care for systemic treatment in the future in view of possible drug-induced skin reaction  Bilateral Leg Cellulitis and diffuse skin rash Etiology unknown, in the setting of recent chemotherapy. Clinically, this does not appear to be consistent with side effects of bendamustine. Patient was admitted on 5/31 from his primary doctor's office due to bilateral lower extremity cellulitis He was placed on Vanco and Zosyn, and treatment was switched to Ancef I recommend ID consult. Skin biopsy is pending.  Bilateral leg edema In the setting of cellulitis and CHF Bilateral lower extremity dopplers are negative for DVT Slowly improving Continue antibiotics and diuresis, as well as leg elevation  Thrombocytopenia This is due to recent chemotherapy, one dose of Solumedrol on 6/1, infection, antibiotics Monitor counts closely He is on Baby Aspirin and Plavix No transfusion is indicated at this time, platelets at 91,000 Transfuse 1 unit of platelets if count is less or equal than 10,000 or 20,000 if the patient is acutely bleeding  Leukocytosis This is likely reactive due to pain, inflammation, possible infection. No intervention is indicated at this time Will continue to monitor  Increasing shortness of breath His weight is stable. He has scattered wheezes. He is on steroids, regular nebulizers as well as anti-biotic therapy. I will  order Chest x-ray to make sure that is not related to pulmonary edema  Full Code  Other medical issues including CHF, CAD, Sleep apnea, gout, GERD as per admitting team    [facility], [place], MD 01/17/2015  2:29 PM

## 2015-01-17 NOTE — Progress Notes (Addendum)
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 Heparin Other (See Comments)    Opposite reaction Heparin induced thrombocytopenia  . Tape Rash    Patient Measurements: Height: 6' (182.9 cm) Weight: (!) 353 lb 3.2 oz (160.21 kg) IBW/kg (Calculated) : 77.6  Vital Signs: Temp: 98 F (36.7 C) (06/05 1330) Temp Source: Oral (06/05 1330) BP: 140/73 mmHg (06/05 1330) Pulse Rate: 66 (06/05 1330)  Labs:  Recent Labs  01/15/15 0500 01/16/15 0545 01/17/15 0550  HGB 13.3 12.9*  --   HCT 41.6 41.1  --   PLT 109* 124*  --   CREATININE 0.82 0.78 0.85    Estimated Creatinine Clearance: 144.6 mL/min (by C-G formula based on Cr of 0.85).   Medical History: Past Medical History  Diagnosis Date  . Heparin induced thrombocytopenia   . Hypertension   . Hyperlipidemia   . Obesity   . Edema   . COPD (chronic obstructive pulmonary disease)   . GERD (gastroesophageal reflux disease)   . Basal cell carcinoma of nose   . Chronic lower back pain   . Depression   . Myocardial infarction   . Heart murmur   . Dysrhythmia     fluttering  . Anginal pain     not had to use in awhile  none in 10 years  . CHF (congestive heart failure)   . Shortness of breath dyspnea     with exertion   . Diabetes mellitus without complication     borderline   . Anxiety   . Blood dyscrasia     trouble clotting   . Coronary artery disease     CABG 2005. s/p PTCA and stenting of the saphenous vein graft to PDA and saphenous vein graft to obtuse marginal by Dr. Burt Knack 09/29/11. Normal EF at cath 09/2011  . Sleep apnea     "used to"      could not use 2006  . Carotid artery disease     s/p L CEA 2009 (hx of evacuation of hematoma due to heparin)  . Lymphoma 12/21/2014    Medications:  Scheduled:  . acyclovir  400 mg Oral Daily  . allopurinol  300 mg Oral Daily  . aspirin EC  81 mg Oral Daily   .  ceFAZolin (ANCEF) IV  1 g Intravenous 3 times per day  . clopidogrel  75 mg Oral Q breakfast  . fondaparinux (ARIXTRA) injection  2.5 mg Subcutaneous Every 24 Hours  . furosemide  40 mg Intravenous Q12H  . hydrALAZINE  25 mg Oral BID  . ipratropium-albuterol  3 mL Nebulization Q4H  . lidocaine-prilocaine   Topical Once  . [START ON 01/18/2015] methylPREDNISolone (SOLU-MEDROL) injection  60 mg Intravenous Q12H  . metoprolol succinate  100 mg Oral Daily  . polyethylene glycol  17 g Oral Daily  . potassium chloride  20 mEq Oral Daily  . senna-docusate  2 tablet Oral QHS  . simvastatin  20 mg Oral q1800  . triamcinolone ointment   Topical TID   PRN: acetaminophen **OR** acetaminophen, albuterol, bisacodyl, clonazePAM, magnesium hydroxide, morphine injection, sodium phosphate, traMADol  Assessment: 49 yoM admitted 5/31 for bilat lower extremity cellulitis.  Note patient with Hx HIT; MD would like to start Arixtra for VTE ppx  Today, 01/17/2015:  CBC with Hgb and Plt sl low, but appear to be relatively stable  SCr wnl; CrCl > 100 CG; 93  N  Note patient weighs 160 kg  Also on ASA, Plavix   Goal of Therapy:  Prevention of VTE   Plan:   Arixtra 2.5 mg SQ q24 hr  Note that unlike enoxaparin, very little data exists to support the effectiveness of standard-dosed Arixtra in obesity.  Low-quality data suggest that nearly half of morbidly obese patients do not achieve adequate anti-Xa levels with Arixtra 2.5 mg daily Edsel Petrin, et al.  Am J Health Syst Pharm. 2011;68(18):1716-1722).  Pharmacologic data suggests 5 mg daily may have a more favorable AUC profile, but this has not been demonstrated clinically.  Given the above, recommend placement of mechanical VTE prophylaxis or frequent ambulation as able.  Pharmacy will follow peripherally  Reuel Boom, PharmD Pager: [phone] 01/17/2015, 3:14 PM

## 2015-01-17 NOTE — Progress Notes (Signed)
Pt refused CPAP for tonight. Pt remains on 3Lpm nasal cannula SpO2-92%. RT will continue to monitor as needed.

## 2015-01-18 ENCOUNTER — Inpatient Hospital Stay (HOSPITAL_COMMUNITY): Payer: BLUE CROSS/BLUE SHIELD

## 2015-01-18 ENCOUNTER — Encounter (HOSPITAL_COMMUNITY): Payer: Self-pay | Admitting: Cardiology

## 2015-01-18 ENCOUNTER — Other Ambulatory Visit (HOSPITAL_COMMUNITY): Payer: BLUE CROSS/BLUE SHIELD

## 2015-01-18 DIAGNOSIS — I10 Essential (primary) hypertension: Secondary | ICD-10-CM

## 2015-01-18 DIAGNOSIS — M310 Hypersensitivity angiitis: Secondary | ICD-10-CM | POA: Insufficient documentation

## 2015-01-18 DIAGNOSIS — I776 Arteritis, unspecified: Secondary | ICD-10-CM

## 2015-01-18 DIAGNOSIS — I5033 Acute on chronic diastolic (congestive) heart failure: Secondary | ICD-10-CM

## 2015-01-18 DIAGNOSIS — L958 Other vasculitis limited to the skin: Secondary | ICD-10-CM

## 2015-01-18 LAB — BASIC METABOLIC PANEL
Anion gap: 6 (ref 5–15)
BUN: 25 mg/dL — ABNORMAL HIGH (ref 6–20)
CALCIUM: 8.7 mg/dL — AB (ref 8.9–10.3)
CO2: 38 mmol/L — ABNORMAL HIGH (ref 22–32)
Chloride: 95 mmol/L — ABNORMAL LOW (ref 101–111)
Creatinine, Ser: 0.84 mg/dL (ref 0.61–1.24)
GFR calc Af Amer: >60 mL/min (ref >60)
GFR calc non Af Amer: >60 mL/min (ref >60)
GLUCOSE: 159 mg/dL — AB (ref 65–99)
Potassium: 4.8 mmol/L (ref 3.5–5.1)
Sodium: 139 mmol/L (ref 135–145)

## 2015-01-18 LAB — CBC
HEMATOCRIT: 45.1 % (ref 39.0–52.0)
Hemoglobin: 14.2 g/dL (ref 13.0–17.0)
MCH: 32.6 pg (ref 26.0–34.0)
MCHC: 31.5 g/dL (ref 30.0–36.0)
MCV: 103.7 fL — ABNORMAL HIGH (ref 78.0–100.0)
PLATELETS: 143 10*3/uL — AB (ref 150–400)
RBC: 4.35 MIL/uL (ref 4.22–5.81)
RDW: 14.1 % (ref 11.5–15.5)
WBC: 18.4 10*3/uL — AB (ref 4.0–10.5)

## 2015-01-18 LAB — TROPONIN I
Troponin I: <0.03 ng/mL (ref <0.031)
Troponin I: <0.03 ng/mL (ref <0.031)

## 2015-01-18 LAB — CULTURE, BLOOD (ROUTINE X 2)
Culture: NO GROWTH
Culture: NO GROWTH

## 2015-01-18 LAB — MPO/PR-3 (ANCA) ANTIBODIES: Myeloperoxidase Abs: <9 U/mL (ref 0.0–9.0)

## 2015-01-18 LAB — BRAIN NATRIURETIC PEPTIDE: B Natriuretic Peptide: 409.3 pg/mL — ABNORMAL HIGH (ref 0.0–100.0)

## 2015-01-18 LAB — ANTINUCLEAR ANTIBODIES, IFA: ANA Ab, IFA: NEGATIVE

## 2015-01-18 MED ORDER — HYDRALAZINE HCL 50 MG PO TABS
50.0000 mg | ORAL_TABLET | Freq: Three times a day (TID) | ORAL | Status: DC
  Administered 2015-01-18 – 2015-01-19 (×4): 50 mg via ORAL
  Filled 2015-01-18 (×4): qty 1

## 2015-01-18 MED ORDER — FONDAPARINUX SODIUM 2.5 MG/0.5ML ~~LOC~~ SOLN
2.5000 mg | Freq: Every day | SUBCUTANEOUS | Status: DC
  Administered 2015-01-18 – 2015-01-19 (×2): 2.5 mg via SUBCUTANEOUS
  Filled 2015-01-18 (×2): qty 0.5

## 2015-01-18 MED ORDER — METHYLPREDNISOLONE SODIUM SUCC 40 MG IJ SOLR
40.0000 mg | Freq: Two times a day (BID) | INTRAMUSCULAR | Status: DC
  Administered 2015-01-19: 40 mg via INTRAVENOUS
  Filled 2015-01-18 (×2): qty 1

## 2015-01-18 MED ORDER — FUROSEMIDE 10 MG/ML IJ SOLN
60.0000 mg | Freq: Three times a day (TID) | INTRAMUSCULAR | Status: DC
  Administered 2015-01-18 (×2): 60 mg via INTRAVENOUS
  Filled 2015-01-18 (×2): qty 6

## 2015-01-18 NOTE — Progress Notes (Signed)
Pt got up to BR about an hour ago to have a stool.  Since then he has had increased dyspnea.  R side exp wheezes, L sided inspiratory and expiratory wheezes and decreased throughout but do hear air movement.  RT in to give tx and approved increase of ox to 4l/m and sat is now 95%.  ble edema has decreased a little.  BNP this AM 409 (it was 405 yesterday), Trop remains <0.03.  Kathline Magic NP on call notified and approves me giving him his 40mg  morning lasix now.  bp 196/85, 98.0,20,hr 68 and remains SR.

## 2015-01-18 NOTE — Progress Notes (Addendum)
Reason for Consult: CHF   Referring Physician:  Dr. Sonia Side  PCP:  Walker Kehr, MD  Primary Cardiologist: Dr. Frankey Poot [name] is an 59 y.o. male.    Chief Complaint: admitted 01/12/15 with cellulitis which has been treated.  No DVT on venous doppler.   Oncology is following as well. Pt developed rash with petechiae and purpura to LE below the knees.  Arterial dopplers with ABI rt 0.73 and Lt 0.88  HPI:   [name] is a 59 y.o. male with h/o lymphoma , last chemo 10days prior to admit, presented for fever and was found to have lower extremity redness, swelling and tenderness since Saturday prior to admit. He reports smoking and no relief on taking demadex for swelling. His labs revealed normal white count, mild thrombocytopenia and elevated lactic acid.  He will be admitted to medical service for management of cellulitis.   Since admit rash has progressed on lower ext.  See above.  ID and surgery have consulted.  Beginning on 01/17/15 he began with increased SOB. IV lasix was added.  Did need to increase oxygen.     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   He was evaluated 2013 for chest discomfort and a Myoview scan demonstrated inferolateral infarct and ischemia. Cardiac catheterization revealed:  LHC 09/29/11: Distal left main 30%, ostial LAD 90%, LIMA-LAD patent, proximal RI occluded, ostial SVG-RI 95%, proximal RCA occluded, proximal SVG-PDA 95%, EF 55%. The patient underwent PCI with Dr. Burt Knack on the same date: Promus DES to the SVG-RI and Promus DES to the SVG-PDA.   Echo 12/24/14  Normal LV size and systolic function, EF 51-02%. Moderate LV hypertrophy. Surprisingly, the LV diastolic function appeared normal. Dilated ascending aorta at 4.1 cm. Normal  RV size and systolic function. No significant valvular abnormalities.  PA peak pressure: 33 mm Hg (S).   EKG:  Normal sinus rhythm with sinus arrhythmia Nonspecific ST abnormality Abnormal ECG-- no acute changes from 07/2014  Negative 1260 overnight and  Negative 5581 since admit. Troponin Negative X 3.   WBC slowly decreasing. On steroids.   Past Medical History  Diagnosis Date  . Heparin induced thrombocytopenia   . Hypertension   . Hyperlipidemia   . Obesity   . Edema   . COPD (chronic obstructive pulmonary disease)   . GERD (gastroesophageal reflux disease)   . Basal cell carcinoma of nose   . Chronic lower back pain   . Depression   . Myocardial infarction   . Heart murmur   . Dysrhythmia     fluttering  . Anginal pain     not had to use in awhile  none in 10 years  . CHF (congestive heart failure)   . Shortness of breath dyspnea     with exertion   . Diabetes mellitus without complication     borderline   . Anxiety   . Blood dyscrasia     trouble clotting   . Coronary artery disease     CABG 2005. s/p PTCA and stenting of the saphenous vein graft to PDA and saphenous vein graft to obtuse marginal by Dr. Burt Knack 09/29/11. Normal EF at cath 09/2011  . Sleep apnea     "used to"      could not use  2006  . Carotid artery disease     s/p L CEA 2009 (hx of evacuation of hematoma due to heparin)  . Lymphoma 12/21/2014    Past Surgical History  Procedure Laterality Date  . Evacuation of hematoma  03/2008    left neck; S/P endarterectomy; "cause heparin clotted it up"  . Coronary angioplasty with stent placement  09/29/11    "2"  . Carotid endarterectomy  03/2008    left  . Coronary artery bypass graft  2005    CABG X3  . Basal cell carcinoma excision  2000's    nose  . Percutaneous coronary stent intervention (pci-s) N/A 09/29/2011    Procedure: PERCUTANEOUS CORONARY STENT INTERVENTION (PCI-S);  Surgeon: Sherren Mocha, MD;  Location: [facility] CATH LAB;  Service:  Cardiovascular;  Laterality: N/A;  . Mass excision Left 12/11/2014    Procedure: EXCISIONAL BIOPSY OF LEFT SUPRA CLAVICULAR NECK MASS;  Surgeon: Jerrell Belfast, MD;  Location: [place];  Service: ENT;  Laterality: Left;  . Superficial lymph node biopsy / excision Left     Family History  Problem Relation Age of Onset  . Heart disease Father   . Bone cancer Mother    Social History:  reports that he has been smoking Cigarettes.  He has a 82 pack-year smoking history. He has quit using smokeless tobacco. His smokeless tobacco use included Chew. He reports that he drinks alcohol. He reports that he uses illicit drugs (Marijuana).  Allergies:  Allergies  Allergen Reactions  . Heparin Other (See Comments)    Opposite reaction Heparin induced thrombocytopenia  . Tape Rash   OUTPT MEDICATIONS: No current facility-administered medications on file prior to encounter.   Current Outpatient Prescriptions on File Prior to Encounter  Medication Sig Dispense Refill  . acyclovir (ZOVIRAX) 400 MG tablet Take 1 tablet (400 mg total) by mouth daily. 30 tablet 3  . albuterol (PROVENTIL) (2.5 MG/3ML) 0.083% nebulizer solution Take 3 mLs (2.5 mg total) by nebulization every 2 (two) hours as needed for wheezing. 75 mL 2  . allopurinol (ZYLOPRIM) 300 MG tablet Take 1 tablet (300 mg total) by mouth daily. 30 tablet 3  . aspirin EC 81 MG tablet Take 81 mg by mouth daily.    . clonazePAM (KLONOPIN) 0.5 MG tablet TAKE 1 TABLET BY MOUTH TWICE A DAY AS NEEDED FOR ANXIETY or at hs for insomnia 180 tablet 0  . clopidogrel (PLAVIX) 75 MG tablet TAKE 1 TABLET BY MOUTH DAILY WITH BREAKFAST. 90 tablet 3  . lidocaine-prilocaine (EMLA) cream Apply to affected area once 30 g 3  . metoprolol succinate (TOPROL-XL) 50 MG 24 hr tablet TAKE 2 TABLETS EVERY DAY 180 tablet 3  . ondansetron (ZOFRAN) 8 MG tablet Take 1 tablet (8 mg total) by mouth every 8 (eight) hours as needed. 30 tablet 1  . potassium chloride SA (KLOR-CON M20)  20 MEQ tablet TAKE 1 TABLET BY MOUTH DAILY 90 tablet 3  . simvastatin (ZOCOR) 20 MG tablet TAKE 1 TABLET (20 MG TOTAL) BY MOUTH AT BEDTIME. 90 tablet 3  . torsemide (DEMADEX) 100 MG tablet Take 100 mg by mouth daily.    . traMADol (ULTRAM) 50 MG tablet TAKE 1 TO 2 TABLETS BY MOUTH TWICE A DAY AS NEEDED FOR PAIN 360 tablet 0  . naproxen sodium (ANAPROX) 220 MG tablet Take 1 tablet (220 mg total) by mouth as needed. (Patient not taking: Reported on 01/12/2015) 60 tablet 2  . nitroGLYCERIN (NITROSTAT) 0.4 MG SL tablet Place 1  tablet (0.4 mg total) under the tongue every 5 (five) minutes as needed for chest pain (Up to 3 doses). 25 tablet 4  . prochlorperazine (COMPAZINE) 10 MG tablet Take 1 tablet (10 mg total) by mouth every 6 (six) hours as needed (Nausea or vomiting). 30 tablet 1  . tiotropium (SPIRIVA) 18 MCG inhalation capsule Place 1 capsule (18 mcg total) into inhaler and inhale daily. (Patient not taking: Reported on 01/12/2015) 30 capsule 2    CURRENT MEDICATIONS: Scheduled Meds: . acyclovir  400 mg Oral Daily  . allopurinol  300 mg Oral Daily  . aspirin EC  81 mg Oral Daily  .  ceFAZolin (ANCEF) IV  1 g Intravenous 3 times per day  . clopidogrel  75 mg Oral Q breakfast  . fondaparinux (ARIXTRA) injection  2.5 mg Subcutaneous Q0600  . furosemide  60 mg Intravenous 3 times per day  . hydrALAZINE  50 mg Oral 3 times per day  . ipratropium-albuterol  3 mL Nebulization TID  . lidocaine-prilocaine   Topical Once  . methylPREDNISolone (SOLU-MEDROL) injection  60 mg Intravenous Q12H  . metoprolol succinate  100 mg Oral Daily  . polyethylene glycol  17 g Oral Daily  . potassium chloride  20 mEq Oral BID  . senna-docusate  2 tablet Oral QHS  . simvastatin  20 mg Oral q1800  . triamcinolone ointment   Topical TID   Continuous Infusions:  PRN Meds:.acetaminophen **OR** acetaminophen, albuterol, bisacodyl, clonazePAM, magnesium hydroxide, morphine injection, sodium chloride, sodium phosphate,  traMADol   Results for orders placed or performed during the hospital encounter of 01/12/15 (from the past 48 hour(s))  Basic metabolic panel     Status: Abnormal   Collection Time: 01/17/15  5:50 AM  Result Value Ref Range   Sodium 141 135 - 145 mmol/L   Potassium 4.6 3.5 - 5.1 mmol/L   Chloride 97 (L) 101 - 111 mmol/L   CO2 34 (H) 22 - 32 mmol/L   Glucose, Bld 187 (H) 65 - 99 mg/dL   BUN 26 (H) 6 - 20 mg/dL   Creatinine, Ser 0.85 0.61 - 1.24 mg/dL   Calcium 8.8 (L) 8.9 - 10.3 mg/dL   GFR calc non Af Amer >60 >60 mL/min   GFR calc Af Amer >60 >60 mL/min    Comment: (NOTE) The eGFR has been calculated using the CKD EPI equation. This calculation has not been validated in all clinical situations. eGFR's persistently <60 mL/min signify possible Chronic Kidney Disease.    Anion gap 10 5 - 15  Brain natriuretic peptide     Status: Abnormal   Collection Time: 01/17/15  5:35 PM  Result Value Ref Range   B Natriuretic Peptide 405.1 (H) 0.0 - 100.0 pg/mL  Troponin I (q 6hr x 3)     Status: None   Collection Time: 01/17/15  9:30 PM  Result Value Ref Range   Troponin I <0.03 <0.031 ng/mL    Comment:        NO INDICATION OF MYOCARDIAL INJURY.   CBC     Status: Abnormal   Collection Time: 01/18/15  3:20 AM  Result Value Ref Range   WBC 18.4 (H) 4.0 - 10.5 K/uL   RBC 4.35 4.22 - 5.81 MIL/uL   Hemoglobin 14.2 13.0 - 17.0 g/dL   HCT 45.1 39.0 - 52.0 %   MCV 103.7 (H) 78.0 - 100.0 fL   MCH 32.6 26.0 - 34.0 pg   MCHC 31.5 30.0 - 36.0 g/dL  RDW 14.1 11.5 - 15.5 %   Platelets 143 (L) 150 - 400 K/uL  Basic metabolic panel     Status: Abnormal   Collection Time: 01/18/15  3:20 AM  Result Value Ref Range   Sodium 139 135 - 145 mmol/L   Potassium 4.8 3.5 - 5.1 mmol/L   Chloride 95 (L) 101 - 111 mmol/L   CO2 38 (H) 22 - 32 mmol/L   Glucose, Bld 159 (H) 65 - 99 mg/dL   BUN 25 (H) 6 - 20 mg/dL   Creatinine, Ser 0.84 0.61 - 1.24 mg/dL   Calcium 8.7 (L) 8.9 - 10.3 mg/dL   GFR calc non  Af Amer >60 >60 mL/min   GFR calc Af Amer >60 >60 mL/min    Comment: (NOTE) The eGFR has been calculated using the CKD EPI equation. This calculation has not been validated in all clinical situations. eGFR's persistently <60 mL/min signify possible Chronic Kidney Disease.    Anion gap 6 5 - 15  Brain natriuretic peptide     Status: Abnormal   Collection Time: 01/18/15  3:20 AM  Result Value Ref Range   B Natriuretic Peptide 409.3 (H) 0.0 - 100.0 pg/mL  Troponin I (q 6hr x 3)     Status: None   Collection Time: 01/18/15  3:20 AM  Result Value Ref Range   Troponin I <0.03 <0.031 ng/mL    Comment:        NO INDICATION OF MYOCARDIAL INJURY.   Troponin I     Status: None   Collection Time: 01/18/15  9:05 AM  Result Value Ref Range   Troponin I <0.03 <0.031 ng/mL    Comment:        NO INDICATION OF MYOCARDIAL INJURY.    Dg Chest 2 View  01/17/2015   CLINICAL DATA:  Shortness of breath today, BILATERAL lower extremity swelling after chemotherapy last Friday, history lymphoma, COPD, MI, CHF, diabetes, hypertension, GERD  EXAM: CHEST  2 VIEW  COMPARISON:  09/13/2011  FINDINGS: RIGHT jugular Port-A-Cath with tip projecting over SVC.  Enlargement of cardiac silhouette post CABG.  Pulmonary vascular congestion.  Interstitial infiltrates bilaterally favor pulmonary edema and CHF.  Scattered mid lung scarring and underlying emphysematous changes.  No acute infiltrate, pleural effusion or pneumothorax.  IMPRESSION: Probable CHF superimposed with COPD.   Electronically Signed   By: Lavonia Dana M.D.   On: 01/17/2015 15:13   Dg Chest Port 1 View  01/18/2015   CLINICAL DATA:  Congestive heart failure.  EXAM: PORTABLE CHEST - 1 VIEW  COMPARISON:  01/17/2015 .  FINDINGS: Power port catheter in stable position. Prior CABG. Persistent cardiomegaly with diffuse pulmonary interstitial prominence. Small pleural effusions cannot be excluded. These findings again consistent with congestive heart failure.  Pneumonitis cannot be excluded. Stable pleural parenchymal thickening noted bilaterally consistent with scarring.  IMPRESSION: 1. Prior CABG. 2. Persistent changes of congestive heart failure with bilateral from interstitial edema. Persistent bilateral pleural parenchymal scarring.   Electronically Signed   By: Marcello Moores  Register   On: 01/18/2015 07:24    ROS: General:no colds or fevers, no weight changes Skin:no rashes or ulcers HEENT:no blurred vision, no congestion CV:see HPI PUL:see HPI GI:no diarrhea constipation or melena, no indigestion GU:no hematuria, no dysuria MS:no joint pain, no claudication, + edema, rash Neuro:no syncope, no lightheadedness Endo:no diabetes, no thyroid disease   Blood pressure 196/85, pulse 67, temperature 98 F (36.7 C), temperature source Oral, resp. rate 20, height 6' (1.829 m),  weight 349 lb 14.4 oz (158.714 kg), SpO2 95 %.  Wt Readings from Last 3 Encounters:  01/18/15 349 lb 14.4 oz (158.714 kg)  01/12/15 350 lb 12.8 oz (159.122 kg)  12/30/14 347 lb 14.4 oz (157.806 kg)    PE: General:Pleasant affect, NAD Skin:Warm and dry, brisk capillary refill, below knees + purpura petechial rash and open blisters HEENT:normocephalic, sclera clear, mucus membranes moist Neck:supple, no JVD,  + carotid bruits  Heart:S1S2 RRR with 2/6 systolic murmur, no  gallup, rub or click Lungs:clear without rales, rhonchi, or wheezes PVX:YIAXK, soft, non tender, + BS, do not palpate liver spleen or masses Ext:tr lower ext edema, 2+ pedal pulses, 2+ radial pulses Neuro:alert and oriented X 3, MAE, follows commands, + facial symmetry Tele:  SB- SR   Assessment/Plan Active Problems:   Cellulitis   Cellulitis of right lower extremity   Cellulitis of left lower extremity   Erythroderma   Purpura   [facility] spotted fever)   Adverse drug reaction   Follicular lymphoma   Congestive heart disease   Chronic diastolic CHF (congestive heart failure)    Thrombocytopenia   Coronary artery disease involving coronary bypass graft of native heart with unstable angina pectoris   CHF:  With normal EF, responding to IV lasix.  EF 55-37%, LV diastolic function parameters were normal.  Continue with diuretics may need nuc study as outpt.  But negative troponin and echo stable. No acute EKG changes, continue lasix IV for 24 hours.    Aorta: Dilated aortic root and ascending aorta. Aortic root dimension: 37 mm (ED). Ascending aortic diameter: 41 mm (S).  Next chemo is scheduled for Thursday  Skin biopsy pending.  CAD, no chest pain and neg troponin, CABG was 2003, last cath 2013 PTCA and stenting of the saphenous vein graft to PDA and saphenous vein graft to obtuse marginal for severe stenosis. Slow flow down the grafts and patent LIMA.  Dr. Irish Lack to see and Herschel [place] R  Nurse Practitioner Certified [place] Pager [phone] or after 5pm or weekends call [phone] 01/18/2015, 10:42 AM     I have examined the patient and reviewed assessment and plan and discussed with patient.  Agree with above as stated.  Normal EF a few weeks ago.  Continue diuresis.  Watch renal function.  Breathing has improved significantly.   Was on high dose diuretic even at home.  Still has some fluid on board.  I think he likely has acute on chronic diastolic heart failure despite echo report.   Scott Lauture S.

## 2015-01-18 NOTE — Progress Notes (Signed)
[facility]    Subjective: Increased dyspnea this morning, was treated with IV lasix with good urine output and feels much better now.  Reports he feels about the same otherwise, notes that lower extremities are about the same without much change.    Remains afebrile.   Antibiotics:  Anti-infectives    Start     Dose/Rate Route Frequency Ordered Stop   01/15/15 1000  ceFAZolin (ANCEF) IVPB 1 g/50 mL premix     1 g 100 mL/hr over 30 Minutes Intravenous 3 times per day 01/15/15 0857     01/13/15 0800  vancomycin (VANCOCIN) 1,250 mg in sodium chloride 0.9 % 250 mL IVPB  Status:  Discontinued     1,250 mg 166.7 mL/hr over 90 Minutes Intravenous Every 12 hours 01/12/15 1919 01/15/15 0855   01/13/15 0400  piperacillin-tazobactam (ZOSYN) IVPB 3.375 g  Status:  Discontinued     3.375 g 12.5 mL/hr over 240 Minutes Intravenous Every 8 hours 01/12/15 1919 01/13/15 1134   01/12/15 2000  acyclovir (ZOVIRAX) tablet 400 mg     400 mg Oral Daily 01/12/15 1938     01/12/15 2000  vancomycin (VANCOCIN) 2,500 mg in sodium chloride 0.9 % 500 mL IVPB  Status:  Discontinued     2,500 mg 250 mL/hr over 120 Minutes Intravenous  Once 01/12/15 1855 01/12/15 1906   01/12/15 2000  vancomycin (VANCOCIN) 1,500 mg in sodium chloride 0.9 % 500 mL IVPB     1,500 mg 250 mL/hr over 120 Minutes Intravenous  Once 01/12/15 1906 01/12/15 2146   01/12/15 1915  piperacillin-tazobactam (ZOSYN) IVPB 3.375 g     3.375 g 100 mL/hr over 30 Minutes Intravenous  Once 01/12/15 1851 01/12/15 2140   01/12/15 1900  vancomycin (VANCOCIN) IVPB 1000 mg/200 mL premix  Status:  Discontinued     1,000 mg 200 mL/hr over 60 Minutes Intravenous  Once 01/12/15 1851 01/12/15 1854   01/12/15 1600  vancomycin (VANCOCIN) IVPB 1000 mg/200 mL premix     1,000 mg 200 mL/hr over 60 Minutes Intravenous  Once 01/12/15 1559 01/12/15 1745      Medications: Scheduled Meds: . acyclovir  400 mg Oral Daily  .  allopurinol  300 mg Oral Daily  . aspirin EC  81 mg Oral Daily  .  ceFAZolin (ANCEF) IV  1 g Intravenous 3 times per day  . clopidogrel  75 mg Oral Q breakfast  . fondaparinux (ARIXTRA) injection  2.5 mg Subcutaneous Q0600  . furosemide  60 mg Intravenous 3 times per day  . hydrALAZINE  50 mg Oral 3 times per day  . ipratropium-albuterol  3 mL Nebulization TID  . lidocaine-prilocaine   Topical Once  . [START ON 01/19/2015] methylPREDNISolone (SOLU-MEDROL) injection  40 mg Intravenous Q12H  . metoprolol succinate  100 mg Oral Daily  . polyethylene glycol  17 g Oral Daily  . potassium chloride  20 mEq Oral BID  . senna-docusate  2 tablet Oral QHS  . simvastatin  20 mg Oral q1800  . triamcinolone ointment   Topical TID   Continuous Infusions:  PRN Meds:.acetaminophen **OR** acetaminophen, albuterol, bisacodyl, clonazePAM, magnesium hydroxide, morphine injection, sodium chloride, sodium phosphate, traMADol    Objective: Weight change:   Intake/Output Summary (Last 24 hours) at 01/18/15 1505 Last data filed at 01/18/15 0437  Gross per 24 hour  Intake    460 ml  Output   1400 ml  Net   -940 ml  Blood pressure 153/69, pulse 62, temperature 98 F (36.7 C), temperature source Oral, resp. rate 20, height 6' (1.829 m), weight 349 lb 14.4 oz (158.714 kg), SpO2 94 %. Temp:  [97.9 F (36.6 C)-98.5 F (36.9 C)] 98 F (36.7 C) (06/06 1422) Pulse Rate:  [62-68] 62 (06/06 1422) Resp:  [20-22] 20 (06/06 1422) BP: (125-196)/(51-85) 153/69 mmHg (06/06 1423) SpO2:  [92 %-98 %] 94 % (06/06 1422) Weight:  [349 lb 14.4 oz (158.714 kg)] 349 lb 14.4 oz (158.714 kg) (06/06 0540)  Physical Exam: General: Alert and awake, oriented x3, NAD on 2L O2 via [facility] RRR no m/r/g Chest: CTAB, no wheezing, rales or rhonchi Abdomen: soft nontender, distended, normal bowel sounds Extremities: lower extremities remain  erythmatous with darkened skin,bilstering has opened to superficial wounds. Neuro:  nonfocal  CBC: CBC    Component Value Date/Time   WBC 18.4* 01/18/2015 0320   WBC 10.7* 12/21/2014 1413   RBC 4.35 01/18/2015 0320   RBC 4.69 12/21/2014 1413   HGB 14.2 01/18/2015 0320   HGB 15.2 12/21/2014 1413   HCT 45.1 01/18/2015 0320   HCT 46.2 12/21/2014 1413   PLT 143* 01/18/2015 0320   PLT 140 12/21/2014 1413   MCV 103.7* 01/18/2015 0320   MCV 98.5* 12/21/2014 1413   MCH 32.6 01/18/2015 0320   MCH 32.4 12/21/2014 1413   MCHC 31.5 01/18/2015 0320   MCHC 32.9 12/21/2014 1413   RDW 14.1 01/18/2015 0320   RDW 13.0 12/21/2014 1413   LYMPHSABS 1.5 12/28/2014 1210   LYMPHSABS 1.5 12/21/2014 1413   MONOABS 1.2* 12/28/2014 1210   MONOABS 1.2* 12/21/2014 1413   EOSABS 0.3 12/28/2014 1210   EOSABS 0.4 12/21/2014 1413   BASOSABS 0.1 12/28/2014 1210   BASOSABS 0.1 12/21/2014 1413       BMET  Recent Labs  01/17/15 0550 01/18/15 0320  NA 141 139  K 4.6 4.8  CL 97* 95*  CO2 34* 38*  GLUCOSE 187* 159*  BUN 26* 25*  CREATININE 0.85 0.84  CALCIUM 8.8* 8.7*     Liver Panel  No results for input(s): PROT, ALBUMIN, AST, ALT, ALKPHOS, BILITOT, BILIDIR, IBILI in the last 72 hours.     Sedimentation Rate No results for input(s): ESRSEDRATE in the last 72 hours. C-Reactive Protein No results for input(s): CRP in the last 72 hours.  Micro Results: Recent Results (from the past 720 hour(s))  Culture, blood (routine x 2)     Status: None   Collection Time: 01/12/15  4:25 PM  Result Value Ref Range Status   Specimen Description BLOOD RIGHT ASSIST CONTROL  Final   Special Requests BOTTLES DRAWN AEROBIC AND ANAEROBIC BML  Final   Culture   Final    NO GROWTH 5 DAYS Performed at [facility]    Report Status 01/18/2015 FINAL  Final  Culture, blood (routine x 2)     Status: None   Collection Time: 01/12/15  4:32 PM  Result Value Ref Range Status   Specimen Description BLOOD LEFT ANTECUBITAL  Final   Special Requests BOTTLES DRAWN AEROBIC AND ANAEROBIC 5  CC EA  Final   Culture   Final    NO GROWTH 5 DAYS Performed at [facility]    Report Status 01/18/2015 FINAL  Final  Fungus culture, blood     Status: None (Preliminary result)   Collection Time: 01/14/15 11:20 AM  Result Value Ref Range Status   Specimen Description BLOOD RIGHT ARM  Final  Special Requests BOTTLES DRAWN AEROBIC ONLY 10CC  Final   Culture   Final    NO FUNGUS ISOLATED;CULTURE IN PROGRESS FOR 7 DAYS Note: Culture results may be compromised due to an excessive volume of blood received in culture bottles. Performed at [facility]    Report Status PENDING  Incomplete  Culture, blood (routine x 2)     Status: None (Preliminary result)   Collection Time: 01/14/15 11:20 AM  Result Value Ref Range Status   Specimen Description BLOOD RIGHT ARM  Final   Special Requests BOTTLES DRAWN AEROBIC AND ANAEROBIC 10CC  Final   Culture   Final           BLOOD CULTURE RECEIVED NO GROWTH TO DATE CULTURE WILL BE HELD FOR 5 DAYS BEFORE ISSUING A FINAL NEGATIVE REPORT Performed at [facility]    Report Status PENDING  Incomplete  Culture, blood (routine x 2)     Status: None (Preliminary result)   Collection Time: 01/14/15 11:29 AM  Result Value Ref Range Status   Specimen Description BLOOD RIGHT HAND  Final   Special Requests BOTTLES DRAWN AEROBIC ONLY 5CC  Final   Culture   Final           BLOOD CULTURE RECEIVED NO GROWTH TO DATE CULTURE WILL BE HELD FOR 5 DAYS BEFORE ISSUING A FINAL NEGATIVE REPORT Performed at [facility]    Report Status PENDING  Incomplete    Studies/Results: Dg Chest 2 View  01/17/2015   CLINICAL DATA:  Shortness of breath today, BILATERAL lower extremity swelling after chemotherapy last Friday, history lymphoma, COPD, MI, CHF, diabetes, hypertension, GERD  EXAM: CHEST  2 VIEW  COMPARISON:  09/13/2011  FINDINGS: RIGHT jugular Port-A-Cath with tip projecting over SVC.  Enlargement of cardiac silhouette post CABG.  Pulmonary  vascular congestion.  Interstitial infiltrates bilaterally favor pulmonary edema and CHF.  Scattered mid lung scarring and underlying emphysematous changes.  No acute infiltrate, pleural effusion or pneumothorax.  IMPRESSION: Probable CHF superimposed with COPD.   Electronically Signed   By: Lavonia Dana M.D.   On: 01/17/2015 15:13   Dg Chest Port 1 View  01/18/2015   CLINICAL DATA:  Congestive heart failure.  EXAM: PORTABLE CHEST - 1 VIEW  COMPARISON:  01/17/2015 .  FINDINGS: Power port catheter in stable position. Prior CABG. Persistent cardiomegaly with diffuse pulmonary interstitial prominence. Small pleural effusions cannot be excluded. These findings again consistent with congestive heart failure. Pneumonitis cannot be excluded. Stable pleural parenchymal thickening noted bilaterally consistent with scarring.  IMPRESSION: 1. Prior CABG. 2. Persistent changes of congestive heart failure with bilateral from interstitial edema. Persistent bilateral pleural parenchymal scarring.   Electronically Signed   By: Marcello Moores  Register   On: 01/18/2015 07:24      Assessment/Plan:  Active Problems:   Cellulitis   Cellulitis of right lower extremity   Cellulitis of left lower extremity   Erythroderma   Purpura   RMSF [facility] spotted fever)   Adverse drug reaction   Follicular lymphoma   Congestive heart disease   Chronic diastolic CHF (congestive heart failure)   Thrombocytopenia   Coronary artery disease involving coronary bypass graft of native heart with unstable angina pectoris   Acute on chronic diastolic congestive heart failure   Vasculitis   Benign essential HTN    [name] is a 59 y.o. male with hx of CHF, chronic venous stasis changes of lower extremity currently receving treatment for Follicular lymphoma  with Rituximab and Bendamustine who developed bilateral leg cellulitis vs drug rash vs vasculitis  Bilateral LE cellulitis versus drug induced vasculitis - Leukocytosis  stable on steroids. - Continue steroids for drug induced vasculitis, awaiting path results from biopsy - Less likely cellulitis today is 7/7 of empiric coverage, can d/c antibiotics after receives last dose of ancef.  Diastolic CHF - SOB improved after lasix, cardiology consulted and seeing today. - Management per Cards/ Primary   LOS: 6 days   Lucious Groves 01/18/2015, 3:05 PM

## 2015-01-18 NOTE — Progress Notes (Signed)
Pt reports he is breathing better, insisting that he get oob to void and stool.  Have requested bariatric bsc and am told none available.  Sat 95% 4l/m.   Weight has decreased to 158.714 from 160.21kgs.

## 2015-01-18 NOTE — Progress Notes (Signed)
Pt refused nocturnal CPAP.

## 2015-01-18 NOTE — Progress Notes (Signed)
[name]   DOB:01-13-1956   AV#:[id number]    I have seen the patient, examined him and edited the notes as follows  Subjective: He feels better from prior day. Denies any fevers or chills. His main complaint today is shortness of breath on minimal exertion, needing O2 increased up to 4 liters after ambulation, none at rest. Due to increased work of breathing he was transferred to the telemetry floor. Denies cough. Denies hemoptysis. Denies chest pain. Appetite is normal. Denies nausea or vomiting. Denies abdominal pain. Last bowel movement on 6/6. Denies urinary complaints.Some of the blisters on the right lower extremity has opened and he is draining serosanguineous fluid. His overall body redness and rash receding. His pain appears to be under control. He denies itchiness.  Scheduled Meds: . acyclovir  400 mg Oral Daily  . allopurinol  300 mg Oral Daily  . aspirin EC  81 mg Oral Daily  .  ceFAZolin (ANCEF) IV  1 g Intravenous 3 times per day  . clopidogrel  75 mg Oral Q breakfast  . furosemide  60 mg Intravenous 3 times per day  . hydrALAZINE  50 mg Oral 3 times per day  . ipratropium-albuterol  3 mL Nebulization TID  . lidocaine-prilocaine   Topical Once  . methylPREDNISolone (SOLU-MEDROL) injection  60 mg Intravenous Q12H  . metoprolol succinate  100 mg Oral Daily  . polyethylene glycol  17 g Oral Daily  . potassium chloride  20 mEq Oral BID  . senna-docusate  2 tablet Oral QHS  . simvastatin  20 mg Oral q1800  . triamcinolone ointment   Topical TID   Continuous Infusions:  PRN Meds:.acetaminophen **OR** acetaminophen, albuterol, bisacodyl, clonazePAM, magnesium hydroxide, morphine injection, sodium chloride, sodium phosphate, traMADol   Objective:  Filed Vitals:   01/18/15 0448  BP: 196/85  Pulse: 68  Temp: 98 F (36.7 C)  Resp: 20     Intake/Output Summary (Last 24 hours) at 01/18/15 0654 Last data filed at 01/18/15 0437  Gross per 24 hour  Intake   1540 ml   Output   2800 ml  Net  -1260 ml    GENERAL:alert, no distress and comfortable SKIN:His skin redness has resolved. There were blisters in his legs have open with serosanguineous discharge. Overall, the cellulitis/skin rash are improving.  EYES: normal, Conjunctiva are pink and non-injected, sclera clear OROPHARYNX:no exudate, no erythema and lips, buccal mucosa, and tongue normal  NECK: supple, thyroid normal size, non-tender, without nodularity LYMPH:  no palpable lymphadenopathy in the cervical, axillary or inguinal LUNGS: No increased work of breathing. He had bilateral wheezes throughout, bibasilar trace crackles, no rhonchi. HEART: regular rate & rhythm and no murmurs with moderate bilateral  lower extremity edema ABDOMEN:abdomen soft, non-tender and normal bowel sounds Musculoskeletal:no cyanosis of digits and no clubbing  NEURO: alert & oriented x 3 with fluent speech, no focal motor/sensory deficits   Labs:  Lab Results  Component Value Date   WBC 18.4* 01/18/2015   HGB 14.2 01/18/2015   HCT 45.1 01/18/2015   MCV 103.7* 01/18/2015   PLT 143* 01/18/2015   NEUTROABS 8.1* 12/28/2014    Lab Results  Component Value Date   NA 139 01/18/2015   K 4.8 01/18/2015   CL 95* 01/18/2015   CO2 38* 01/18/2015   Imaging Studies: I reviewed the CXR Chest 2 View  6/5/2016COMPARISON: 09/13/2011 FINDINGS: RIGHT jugular Port-A-Cath with tip projecting over SVC. Enlargement of cardiac silhouette post CABG. Pulmonary vascular congestion. Interstitial infiltrates  bilaterally favor pulmonary edema and CHF. Scattered mid lung scarring and underlying emphysematous changes. No acute infiltrate, pleural effusion or pneumothorax. IMPRESSION: Probable CHF superimposed with COPD.   Assessment & Plan:  Follicular Lymphoma, Grade 3a S/p Cycle 1 D1on 5/19 with Rituximab and Bendamustine and every 28 days Continue supportive care. 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 Clinically, this does not appear to be consistent with side effects of bendamustine. Patient was admitted on 5/31 from his primary doctor's office due to bilateral lower extremity cellulitis He was placed on Vanco and Zosyn, and treatment was switched to Ancef He is on steroids as well. ID is following. Skin biopsy is pending.  Bilateral leg edema In the setting of cellulitis and CHF Bilateral lower extremity dopplers are negative for DVT Slowly improving Continue antibiotics and diuresis, as well as leg elevation  Thrombocytopenia This is due to recent chemotherapy, infection, antibiotics Monitor counts closely He is on Baby Aspirin and Plavix No transfusion is indicated at this time, platelets at 143,000 Transfuse 1 unit of platelets if count is less or equal than 10,000 or 20,000 if the patient is acutely bleeding  Leukocytosis This is likely reactive due to possible infection and steroids No intervention is indicated at this time Will continue to monitor  Increasing shortness of breath on exertion Chest x-ray remarkable for pulmonary vascular congestion due to CHF and superimposed COPD His weight is stable. He has scattered wheezes and trace rales. He was placed on IV Lasix and fluid restriction with improvement of symptoms He is on steroids, regular nebulizers as well as antibiotic therapy. Continue oxygen while ambulating and as needed Status improving Appreciate cardiology consultation.  Full Code  Other medical issues including CHF, CAD, Sleep apnea, gout, GERD as per admitting team  [facility] E, PA-C 01/18/2015  6:54 AM Dorrene Bently, MD 01/18/2015

## 2015-01-18 NOTE — Progress Notes (Addendum)
PATIENT DETAILS Name: [name] Age: 59 y.o. Sex: male Date of Birth: Jan 19, 1956 Admit Date: 01/12/2015 Admitting Physician Hosie Poisson, MD ERX:VQMG Plotnikov, MD  Brief Narrative 59 year old male with history of grade 3 follicular lymphoma status post first cycle of Rituximab and Bendamustine on 12/31/14 presented to the hospital on 5/31 with bilateral lower extremity swelling and erythema. Patient was initially thought to have cellulitis and started on empiric vancomycin and Zosyn. However, retrospectively thought to have probably vasculitis/allergic reaction to his most recent chemotherapy. 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 Started on empiric IV steroids with significant improvement. Unfortunately hospital course complicated by development of acute diastolic heart failure. Oncology, infectious disease and cardiology following.  Subjective: SOB better. Lower ext erythema improved.  Assessment/Plan: Active Problems: Bilateral lower extremity Rash: Doubt that this is cellulitis, suspect that this is more of a vasculitis/allergic reaction to his most recent chemotherapy.  Empirically started on Solu-Medrol, antibiotics narrowed down to Ancef. Rash now improved-Some erythema has started to clear-did  develop a few blisters in the right lower extremity that have opened into superficial ulcerations. Punch biopsy done on 6/2 confirms leukocytoclastic vasculitis, will taper Solu-Medrol down to 40 mg twice a day, suspect we can transition to prednisone in the next few days  Acute diastolic heart failure: Suspect decompensation secondary to excessive fluid intake-note decompensated while on 40 mg IV Lasix. Much more improved today, continue Lasix 60 mg IV 3 times a day. Have asked cardiology to evaluate. EKG/enzymes negative so far. Telemetry negative for any arrhythmias.   History of lymphoma: Recent chemotherapy with Rituximab and Bendamustine.  Oncology following- who plans to discuss case at tumor board-before offering any more chemotherapy   Thrombocytopenia: Likely secondary to recent chemotherapy.Improving upto 124K- check CBC periodically  Leukocytosis: Likely from steroids.Will follow.  History of CAD status post CABG in 2005 and PCI in 2013: Continue aspirin/Plavix, statin and metoprolol. Currently without chest pain or shortness of breath.  History of carotid artery disease-status post left carotid endarterectomy in 2009:continue aspirin/Plavix and statin.  History of heparin-induced thrombocytopenia: Avoid Lovenox/heparin  History of obstructive sleep apnea: Offer CPAP daily at bedtime-refuses  History of gout: Continue with allopurinol-currently stable.  HTN:BP better controlled -continue Metoprolol-add hydralazine  GERD: Continue PPI  Disposition: Remain inpatient-home 1-2 days  Antimicrobial agents  See below  Anti-infectives    Start     Dose/Rate Route Frequency Ordered Stop   01/15/15 1000  ceFAZolin (ANCEF) IVPB 1 g/50 mL premix     1 g 100 mL/hr over 30 Minutes Intravenous 3 times per day 01/15/15 0857     01/13/15 0800  vancomycin (VANCOCIN) 1,250 mg in sodium chloride 0.9 % 250 mL IVPB  Status:  Discontinued     1,250 mg 166.7 mL/hr over 90 Minutes Intravenous Every 12 hours 01/12/15 1919 01/15/15 0855   01/13/15 0400  piperacillin-tazobactam (ZOSYN) IVPB 3.375 g  Status:  Discontinued     3.375 g 12.5 mL/hr over 240 Minutes Intravenous Every 8 hours 01/12/15 1919 01/13/15 1134   01/12/15 2000  acyclovir (ZOVIRAX) tablet 400 mg     400 mg Oral Daily 01/12/15 1938     01/12/15 2000  vancomycin (VANCOCIN) 2,500 mg in sodium chloride 0.9 % 500 mL IVPB  Status:  Discontinued     2,500 mg 250 mL/hr over 120 Minutes Intravenous  Once 01/12/15 1855 01/12/15 1906   01/12/15 2000  vancomycin (VANCOCIN) 1,500 mg in sodium chloride 0.9 % 500 mL IVPB     1,500 mg 250 mL/hr over 120 Minutes Intravenous   Once 01/12/15 1906 01/12/15 2146   01/12/15 1915  piperacillin-tazobactam (ZOSYN) IVPB 3.375 g     3.375 g 100 mL/hr over 30 Minutes Intravenous  Once 01/12/15 1851 01/12/15 2140   01/12/15 1900  vancomycin (VANCOCIN) IVPB 1000 mg/200 mL premix  Status:  Discontinued     1,000 mg 200 mL/hr over 60 Minutes Intravenous  Once 01/12/15 1851 01/12/15 1854   01/12/15 1600  vancomycin (VANCOCIN) IVPB 1000 mg/200 mL premix     1,000 mg 200 mL/hr over 60 Minutes Intravenous  Once 01/12/15 1559 01/12/15 1745      DVT Prophylaxis: Arixtra  Code Status: Full code   Family Communication spouse  at bedside  Procedures: None  CONSULTS:  hematology/oncology  Time spent 25 minutes-Greater than 50% of this time was spent in counseling, explanation of diagnosis, planning of further management, and coordination of care.  MEDICATIONS: Scheduled Meds: . acyclovir  400 mg Oral Daily  . allopurinol  300 mg Oral Daily  . aspirin EC  81 mg Oral Daily  .  ceFAZolin (ANCEF) IV  1 g Intravenous 3 times per day  . clopidogrel  75 mg Oral Q breakfast  . fondaparinux (ARIXTRA) injection  2.5 mg Subcutaneous Q0600  . furosemide  60 mg Intravenous 3 times per day  . hydrALAZINE  50 mg Oral 3 times per day  . ipratropium-albuterol  3 mL Nebulization TID  . lidocaine-prilocaine   Topical Once  . methylPREDNISolone (SOLU-MEDROL) injection  60 mg Intravenous Q12H  . metoprolol succinate  100 mg Oral Daily  . polyethylene glycol  17 g Oral Daily  . potassium chloride  20 mEq Oral BID  . senna-docusate  2 tablet Oral QHS  . simvastatin  20 mg Oral q1800  . triamcinolone ointment   Topical TID   Continuous Infusions:  PRN Meds:.acetaminophen **OR** acetaminophen, albuterol, bisacodyl, clonazePAM, magnesium hydroxide, morphine injection, sodium chloride, sodium phosphate, traMADol    PHYSICAL EXAM: Vital signs in last 24 hours: Filed Vitals:   01/18/15 1320 01/18/15 1349 01/18/15 1422 01/18/15 1423    BP: 154/62  178/79 153/69  Pulse:   62   Temp:   98 F (36.7 C)   TempSrc:      Resp:   20   Height:      Weight:      SpO2:  94% 94%     Weight change:  Filed Weights   01/15/15 0649 01/15/15 0950 01/18/15 0540  Weight: 159.2 kg (350 lb 15.6 oz) 160.21 kg (353 lb 3.2 oz) 158.714 kg (349 lb 14.4 oz)   Body mass index is 47.44 kg/(m^2).   Gen Exam: Awake and alert with clear speech.  Neck: Supple, No JVD.   Chest: B/L Clear.  No rales today CVS: S1 S2 Regular, no murmurs.  Abdomen: soft, BS +, non tender, non distended.  Extremities:See pic below Neurologic: Non Focal.   Skin: see pics below from today Wounds: N/A.    Right Leg 6/6     Left leg 6/6  Intake/Output from previous day:  Intake/Output Summary (Last 24 hours) at 01/18/15 1434 Last data filed at 01/18/15 0437  Gross per 24 hour  Intake    700 ml  Output   1400 ml  Net   -700 ml     LAB RESULTS: CBC  Recent Labs Lab  01/12/15 1355 01/14/15 0530 01/15/15 0500 01/16/15 0545 01/18/15 0320  WBC 10.1 15.4* 21.7* 18.4* 18.4*  HGB 14.7 13.5 13.3 12.9* 14.2  HCT 45.3 41.1 41.6 41.1 45.1  PLT 109* 91* 109* 124* 143*  MCV 99.3 99.3 99.3 101.2* 103.7*  MCH 32.2 32.6 31.7 31.8 32.6  MCHC 32.5 32.8 32.0 31.4 31.5  RDW 13.6 13.9 14.1 14.3 14.1    Chemistries   Recent Labs Lab 01/14/15 0530 01/15/15 0500 01/16/15 0545 01/17/15 0550 01/18/15 0320  NA 139 137 138 141 139  K 3.5 3.9 4.4 4.6 4.8  CL 96* 93* 94* 97* 95*  CO2 34* 31 34* 34* 38*  GLUCOSE 177* 215* 214* 187* 159*  BUN 17 25* 24* 26* 25*  CREATININE 0.85 0.82 0.78 0.85 0.84  CALCIUM 8.7* 8.7* 8.7* 8.8* 8.7*    CBG: No results for input(s): GLUCAP in the last 168 hours.  GFR Estimated Creatinine Clearance: 145.5 mL/min (by C-G formula based on Cr of 0.84).  Coagulation profile No results for input(s): INR, PROTIME in the last 168 hours.  Cardiac Enzymes  Recent Labs Lab 01/17/15 2130 01/18/15 0320 01/18/15 0905   TROPONINI <0.03 <0.03 <0.03    Invalid input(s): POCBNP No results for input(s): DDIMER in the last 72 hours. No results for input(s): HGBA1C in the last 72 hours. No results for input(s): CHOL, HDL, LDLCALC, TRIG, CHOLHDL, LDLDIRECT in the last 72 hours. No results for input(s): TSH, T4TOTAL, T3FREE, THYROIDAB in the last 72 hours.  Invalid input(s): FREET3 No results for input(s): VITAMINB12, FOLATE, FERRITIN, TIBC, IRON, RETICCTPCT in the last 72 hours. No results for input(s): LIPASE, AMYLASE in the last 72 hours.  Urine Studies No results for input(s): UHGB, CRYS in the last 72 hours.  Invalid input(s): UACOL, UAPR, USPG, UPH, UTP, UGL, UKET, UBIL, UNIT, UROB, ULEU, UEPI, UWBC, URBC, UBAC, CAST, UCOM, BILUA  MICROBIOLOGY: Recent Results (from the past 240 hour(s))  Culture, blood (routine x 2)     Status: None   Collection Time: 01/12/15  4:25 PM  Result Value Ref Range Status   Specimen Description BLOOD RIGHT ASSIST CONTROL  Final   Special Requests BOTTLES DRAWN AEROBIC AND ANAEROBIC BML  Final   Culture   Final    NO GROWTH 5 DAYS Performed at [facility]    Report Status 01/18/2015 FINAL  Final  Culture, blood (routine x 2)     Status: None   Collection Time: 01/12/15  4:32 PM  Result Value Ref Range Status   Specimen Description BLOOD LEFT ANTECUBITAL  Final   Special Requests BOTTLES DRAWN AEROBIC AND ANAEROBIC 5 CC EA  Final   Culture   Final    NO GROWTH 5 DAYS Performed at [facility]    Report Status 01/18/2015 FINAL  Final  Fungus culture, blood     Status: None (Preliminary result)   Collection Time: 01/14/15 11:20 AM  Result Value Ref Range Status   Specimen Description BLOOD RIGHT ARM  Final   Special Requests BOTTLES DRAWN AEROBIC ONLY 10CC  Final   Culture   Final    NO FUNGUS ISOLATED;CULTURE IN PROGRESS FOR 7 DAYS Note: Culture results may be compromised due to an excessive volume of blood received in culture  bottles. Performed at [facility]    Report Status PENDING  Incomplete  Culture, blood (routine x 2)     Status: None (Preliminary result)   Collection Time: 01/14/15 11:20 AM  Result Value Ref  Range Status   Specimen Description BLOOD RIGHT ARM  Final   Special Requests BOTTLES DRAWN AEROBIC AND ANAEROBIC 10CC  Final   Culture   Final           BLOOD CULTURE RECEIVED NO GROWTH TO DATE CULTURE WILL BE HELD FOR 5 DAYS BEFORE ISSUING A FINAL NEGATIVE REPORT Performed at [facility]    Report Status PENDING  Incomplete  Culture, blood (routine x 2)     Status: None (Preliminary result)   Collection Time: 01/14/15 11:29 AM  Result Value Ref Range Status   Specimen Description BLOOD RIGHT HAND  Final   Special Requests BOTTLES DRAWN AEROBIC ONLY 5CC  Final   Culture   Final           BLOOD CULTURE RECEIVED NO GROWTH TO DATE CULTURE WILL BE HELD FOR 5 DAYS BEFORE ISSUING A FINAL NEGATIVE REPORT Performed at [facility]    Report Status PENDING  Incomplete    RADIOLOGY STUDIES/RESULTS: Dg Chest 2 View  01/17/2015   CLINICAL DATA:  Shortness of breath today, BILATERAL lower extremity swelling after chemotherapy last Friday, history lymphoma, COPD, MI, CHF, diabetes, hypertension, GERD  EXAM: CHEST  2 VIEW  COMPARISON:  09/13/2011  FINDINGS: RIGHT jugular Port-A-Cath with tip projecting over SVC.  Enlargement of cardiac silhouette post CABG.  Pulmonary vascular congestion.  Interstitial infiltrates bilaterally favor pulmonary edema and CHF.  Scattered mid lung scarring and underlying emphysematous changes.  No acute infiltrate, pleural effusion or pneumothorax.  IMPRESSION: Probable CHF superimposed with COPD.   Electronically Signed   By: Lavonia Dana M.D.   On: 01/17/2015 15:13   Nm Pet Image Initial (pi) Skull Base To Thigh  12/25/2014   CLINICAL DATA:  Initial treatment strategy for lymphoma.  EXAM: NUCLEAR MEDICINE PET SKULL BASE TO THIGH  TECHNIQUE: 16.0 mCi F-18  FDG was injected intravenously. Full-ring PET imaging was performed from the skull base to thigh after the radiotracer. CT data was obtained and used for attenuation correction and anatomic localization.  FASTING BLOOD GLUCOSE:  Value: 121 mg/dl  COMPARISON:  06/24/2009  FINDINGS: NECK  The superficial left supraclavicular lymph node measuring 4.4 by 4.6 cm on image 40 of series 4 has maximum standard uptake value 12.2, Deauville 5. There is a small amount of gas within this lesion suggesting recent biopsy.  Symmetric tonsillar activity. Small amount of activity superficial to the right submandibular gland is probably vascular. Probable sebaceous cyst along the right posterior lower neck, image 38 series 4, photopenic.  CHEST  Left upper prevascular lymph node measuring 1.5 cm in short axis on image 66 of series 4 has maximum standard uptake value 7.9, Deauville 4. A small right upper paratracheal lymph node measuring 0.9 cm in short axis on image 60 of series 4 has maximum standard uptake value 4.3, Deauville 3.  Calcified pleural plaques noted. Background vascular mediastinal activity approximately 3.6.  Other chest findings include coronary artery atherosclerosis, cardiomegaly, calcified pleural plaques (left greater than right), and bilateral airway thickening. Prior CABG.  ABDOMEN/PELVIS  Background hepatic activity 4.4. Portacaval lymph node measuring 1.6 cm in short axis on image 133 of series 4 has maximum standard uptake value 7.2, Deauville 4. A a lymph node adjacent to the left adrenal gland measuring 1.9 cm in short axis on image 123 of series 4 has maximum standard uptake value 6.7, Deauville 4. At the level of the left renal vessels, a left periaortic lymph node with maximum  standard uptake value 7.1 measures 1.3 cm in short axis (image 136, series 4), Deauville 4.  Bilateral hypermetabolic inguinal lymph nodes are present. A right inguinal lymph node measuring 1.3 cm in short axis on image 218 of  series 4 has maximum standard uptake value 4.2, Deauville 3  Other abdominal findings include cholelithiasis, bilateral nonobstructive nephrolithiasis, and aortoiliac atherosclerosis.  SKELETON  Heterogeneous marrow activity noted but without definite focal bony lesion. Incidental note is made of right antecubital activity, maximum standard uptake value 8.6, probably injection related (this is the site of injection. ). Bridging spurring of both sacroiliac joints noted.  IMPRESSION: 1. Scattered hypermetabolic adenopathy in the neck, chest, abdomen, and inguinal regions, compatible with active lymphoma. 2. Airway thickening is present, suggesting bronchitis or reactive airways disease. 3. Scattered heterogeneous marrow activity without a focal lesion, uncertain significance. 4. Other findings include calcified pleural plaques left greater than right ; prior CABG; cardiomegaly; cholelithiasis; and bilateral nonobstructive nephrolithiasis.   Electronically Signed   By: Van Clines M.D.   On: 12/25/2014 09:09   Ir Fluoro Guide Cv Line Right  12/28/2014   CLINICAL DATA:  Lymphoma and need for porta cath to begin chemotherapy.  EXAM: IMPLANTED PORT A CATH PLACEMENT WITH ULTRASOUND AND FLUOROSCOPIC GUIDANCE  ANESTHESIA/SEDATION: 4.5 Mg IV Versed; 100 mcg IV Fentanyl  Total Moderate Sedation Time:  36 minutes.  Additional Medications: 3 g IV Ancef. As antibiotic prophylaxis, Ancef was ordered pre-procedure and administered intravenously within one hour of incision.  FLUOROSCOPY TIME:  18 seconds.  PROCEDURE: The procedure, risks, benefits, and alternatives were explained to the patient. Questions regarding the procedure were encouraged and answered. The patient understands and consents to the procedure. A time-out was performed prior to the procedure.  Ultrasound was used to confirm patency of the right internal jugular vein. The right neck and chest were prepped with chlorhexidine in a sterile fashion, and a  sterile drape was applied covering the operative field. Maximum barrier sterile technique with sterile gowns and gloves were used for the procedure. Local anesthesia was provided with 1% lidocaine.  After creating a small venotomy incision, a 21 gauge needle was advanced into the right internal jugular vein under direct, real-time ultrasound guidance. Ultrasound image documentation was performed. After securing guidewire access, an 8 Fr dilator was placed. A J-wire was kinked to measure appropriate catheter length.  A subcutaneous port pocket was then created along the upper chest wall utilizing sharp and blunt dissection. Portable cautery was utilized. The pocket was irrigated with sterile saline.  A single lumen power injectable port was chosen for placement. The 8 Fr catheter was tunneled from the port pocket site to the venotomy incision. The port was placed in the pocket. External catheter was trimmed to appropriate length based on guidewire measurement.  At the venotomy, an 8 Fr peel-away sheath was placed over a guidewire. The catheter was then placed through the sheath and the sheath removed. Final catheter positioning was confirmed and documented with a fluoroscopic spot image. The port was accessed with a needle and aspirated and flushed with saline. The needle was removed.  The venotomy and port pocket incisions were closed with subcutaneous 3-0 Monocryl and subcuticular 4-0 Vicryl. Dermabond was applied to both incisions.  COMPLICATIONS: None  FINDINGS: After catheter placement, the tip lies at the cavoatrial junction. The catheter aspirates normally and is ready for immediate use.  IMPRESSION: Placement of single lumen port a cath via right internal jugular vein. The catheter tip lies  at the cavoatrial junction. A power injectable port a cath was placed and is ready for immediate use.   Electronically Signed   By: Aletta Edouard M.D.   On: 12/28/2014 15:02   Ir US Guide Vasc Access Right  12/28/2014    CLINICAL DATA:  Lymphoma and need for porta cath to begin chemotherapy.  EXAM: IMPLANTED PORT A CATH PLACEMENT WITH ULTRASOUND AND FLUOROSCOPIC GUIDANCE  ANESTHESIA/SEDATION: 4.5 Mg IV Versed; 100 mcg IV Fentanyl  Total Moderate Sedation Time:  36 minutes.  Additional Medications: 3 g IV Ancef. As antibiotic prophylaxis, Ancef was ordered pre-procedure and administered intravenously within one hour of incision.  FLUOROSCOPY TIME:  18 seconds.  PROCEDURE: The procedure, risks, benefits, and alternatives were explained to the patient. Questions regarding the procedure were encouraged and answered. The patient understands and consents to the procedure. A time-out was performed prior to the procedure.  Ultrasound was used to confirm patency of the right internal jugular vein. The right neck and chest were prepped with chlorhexidine in a sterile fashion, and a sterile drape was applied covering the operative field. Maximum barrier sterile technique with sterile gowns and gloves were used for the procedure. Local anesthesia was provided with 1% lidocaine.  After creating a small venotomy incision, a 21 gauge needle was advanced into the right internal jugular vein under direct, real-time ultrasound guidance. Ultrasound image documentation was performed. After securing guidewire access, an 8 Fr dilator was placed. A J-wire was kinked to measure appropriate catheter length.  A subcutaneous port pocket was then created along the upper chest wall utilizing sharp and blunt dissection. Portable cautery was utilized. The pocket was irrigated with sterile saline.  A single lumen power injectable port was chosen for placement. The 8 Fr catheter was tunneled from the port pocket site to the venotomy incision. The port was placed in the pocket. External catheter was trimmed to appropriate length based on guidewire measurement.  At the venotomy, an 8 Fr peel-away sheath was placed over a guidewire. The catheter was then placed  through the sheath and the sheath removed. Final catheter positioning was confirmed and documented with a fluoroscopic spot image. The port was accessed with a needle and aspirated and flushed with saline. The needle was removed.  The venotomy and port pocket incisions were closed with subcutaneous 3-0 Monocryl and subcuticular 4-0 Vicryl. Dermabond was applied to both incisions.  COMPLICATIONS: None  FINDINGS: After catheter placement, the tip lies at the cavoatrial junction. The catheter aspirates normally and is ready for immediate use.  IMPRESSION: Placement of single lumen port a cath via right internal jugular vein. The catheter tip lies at the cavoatrial junction. A power injectable port a cath was placed and is ready for immediate use.   Electronically Signed   By: Aletta Edouard M.D.   On: 12/28/2014 15:02   Dg Chest Port 1 View  01/18/2015   CLINICAL DATA:  Congestive heart failure.  EXAM: PORTABLE CHEST - 1 VIEW  COMPARISON:  01/17/2015 .  FINDINGS: Power port catheter in stable position. Prior CABG. Persistent cardiomegaly with diffuse pulmonary interstitial prominence. Small pleural effusions cannot be excluded. These findings again consistent with congestive heart failure. Pneumonitis cannot be excluded. Stable pleural parenchymal thickening noted bilaterally consistent with scarring.  IMPRESSION: 1. Prior CABG. 2. Persistent changes of congestive heart failure with bilateral from interstitial edema. Persistent bilateral pleural parenchymal scarring.   Electronically Signed   By: Marcello Moores  Register   On: 01/18/2015 07:24  Oren Binet, MD  [facility] Pager:[phone]  If 7PM-7AM, please contact night-coverage [email or web address] Password [email or web address] 01/18/2015, 2:34 PM   LOS: 6 days

## 2015-01-18 NOTE — Progress Notes (Signed)
Patient had 8 beats V-tach, asymptomatic, VS stable (BP elevated, see flowsheet). Cardiologist at bedside, RN notified cardiologist.  Barbee Shropshire. Brigitte Pulse, RN

## 2015-01-18 NOTE — Care Management Note (Signed)
Case Management Note  Patient Details  Name: [name] MRN: [id number] Date of Birth: Jan 09, 1956  Subjective/Objective: 59 y/o m admitted w/BLE cellulitis.ID following-iv abx,improving.                   Action/Plan:d/c plan home.   Expected Discharge Date:   (unknown)               Expected Discharge Plan:  Home/Self Care  In-House Referral:     Discharge planning Services  CM Consult  Post Acute Care Choice:    Choice offered to:     DME Arranged:    DME Agency:     HH Arranged:    HH Agency:     Status of Service:  In process, will continue to follow  Medicare Important Message Given:    Date Medicare IM Given:    Medicare IM give by:    Date Additional Medicare IM Given:    Additional Medicare Important Message give by:     If discussed at [place] of Stay Meetings, dates discussed:    Additional Comments:  Dessa Phi, RN 01/18/2015, 3:53 PM

## 2015-01-19 LAB — CBC
HCT: 45.2 % (ref 39.0–52.0)
HEMOGLOBIN: 14 g/dL (ref 13.0–17.0)
MCH: 31.7 pg (ref 26.0–34.0)
MCHC: 31 g/dL (ref 30.0–36.0)
MCV: 102.3 fL — ABNORMAL HIGH (ref 78.0–100.0)
PLATELETS: 175 10*3/uL (ref 150–400)
RBC: 4.42 MIL/uL (ref 4.22–5.81)
RDW: 13.9 % (ref 11.5–15.5)
WBC: 15.3 10*3/uL — AB (ref 4.0–10.5)

## 2015-01-19 MED ORDER — ANTICOAGULANT SODIUM CITRATE 4% (200MG/5ML) IV SOLN
5.0000 mL | Status: AC
  Administered 2015-01-19: 5 mL
  Filled 2015-01-19: qty 250

## 2015-01-19 MED ORDER — OXYCODONE-ACETAMINOPHEN 5-325 MG PO TABS: 1.0000 | ORAL_TABLET | Freq: Four times a day (QID) | ORAL | Status: DC | PRN

## 2015-01-19 MED ORDER — PREDNISONE 10 MG PO TABS: ORAL_TABLET | ORAL | Status: DC

## 2015-01-19 MED ORDER — POTASSIUM CHLORIDE CRYS ER 20 MEQ PO TBCR: 20.0000 meq | EXTENDED_RELEASE_TABLET | Freq: Every day | ORAL | Status: DC

## 2015-01-19 MED ORDER — HYDRALAZINE HCL 50 MG PO TABS: 50.0000 mg | ORAL_TABLET | Freq: Three times a day (TID) | ORAL | Status: DC

## 2015-01-19 MED ORDER — TRIAMCINOLONE ACETONIDE 0.1 % EX OINT: TOPICAL_OINTMENT | Freq: Three times a day (TID) | CUTANEOUS | Status: DC

## 2015-01-19 MED ORDER — OXYCODONE-ACETAMINOPHEN 5-325 MG PO TABS
1.0000 | ORAL_TABLET | Freq: Four times a day (QID) | ORAL | Status: DC | PRN
  Administered 2015-01-19: 2 via ORAL
  Filled 2015-01-19: qty 2

## 2015-01-19 NOTE — Progress Notes (Signed)
Subjective: Overall feels better, no SOB  Objective: Vital signs in last 24 hours: Temp:  [98 F (36.7 C)-98.3 F (36.8 C)] 98 F (36.7 C) (06/07 0435) Pulse Rate:  [62-67] 62 (06/07 0435) Resp:  [20] 20 (06/07 0435) BP: (153-178)/(52-80) 168/52 mmHg (06/07 0435) SpO2:  [92 %-96 %] 96 % (06/07 0435) Weight:  [347 lb 1.6 oz (157.444 kg)] 347 lb 1.6 oz (157.444 kg) (06/07 0435) Weight change: -2 lb 12.8 oz (-1.27 kg) Last BM Date: 01/18/15 Intake/Output from previous day: -3450  (since admit -9031)wt 347 down from pk of 353 lbs. 06/06 0701 - 06/07 0700 In: 100 [IV Piggyback:100] Out: 3550 [Urine:3550] Intake/Output this shift:    PE: General:Pleasant affect, NAD Skin:Warm and dry, brisk capillary refill HEENT:normocephalic, sclera clear, mucus membranes moist Heart:S1S2 RRR with 2/6 systolic murmur, no gallup, rub or click Lungs: without rales, rhonchi, + exp. wheezes IPJ:ASNKN, soft, non tender, + BS, do not palpate liver spleen or masses Ext:+ lower ext edema but improved, + purpura and petechial rash rt leg with dressing was oozing from open blisters,  2+ radial pulses Neuro:alert and oriented, MAE, follows commands, + facial symmetry Tele: SR with occ NSVT 5 beats and 8 beats. Does have HR at 48 during the night does not wish to wear CPAP for his OSA, cannot tolerate.     Lab Results:  Recent Labs  01/18/15 0320 01/19/15 0200  WBC 18.4* 15.3*  HGB 14.2 14.0  HCT 45.1 45.2  PLT 143* 175   BMET  Recent Labs  01/17/15 0550 01/18/15 0320  NA 141 139  K 4.6 4.8  CL 97* 95*  CO2 34* 38*  GLUCOSE 187* 159*  BUN 26* 25*  CREATININE 0.85 0.84  CALCIUM 8.8* 8.7*    Recent Labs  01/18/15 0320 01/18/15 0905  TROPONINI <0.03 <0.03    Lab Results  Component Value Date   CHOL 131 03/25/2014   HDL 24.00* 03/25/2014   LDLCALC 70 03/25/2014   TRIG 185.0* 03/25/2014   CHOLHDL 5 03/25/2014   Lab Results  Component Value Date   HGBA1C 6.0  03/25/2014     Lab Results  Component Value Date   TSH 2.01 02/25/2013     Studies/Results: Dg Chest 2 View  01/17/2015   CLINICAL DATA:  Shortness of breath today, BILATERAL lower extremity swelling after chemotherapy last Friday, history lymphoma, COPD, MI, CHF, diabetes, hypertension, GERD  EXAM: CHEST  2 VIEW  COMPARISON:  09/13/2011  FINDINGS: RIGHT jugular Port-A-Cath with tip projecting over SVC.  Enlargement of cardiac silhouette post CABG.  Pulmonary vascular congestion.  Interstitial infiltrates bilaterally favor pulmonary edema and CHF.  Scattered mid lung scarring and underlying emphysematous changes.  No acute infiltrate, pleural effusion or pneumothorax.  IMPRESSION: Probable CHF superimposed with COPD.   Electronically Signed   By: Lavonia Dana M.D.   On: 01/17/2015 15:13   Dg Chest Port 1 View  01/18/2015   CLINICAL DATA:  Congestive heart failure.  EXAM: PORTABLE CHEST - 1 VIEW  COMPARISON:  01/17/2015 .  FINDINGS: Power port catheter in stable position. Prior CABG. Persistent cardiomegaly with diffuse pulmonary interstitial prominence. Small pleural effusions cannot be excluded. These findings again consistent with congestive heart failure. Pneumonitis cannot be excluded. Stable pleural parenchymal thickening noted bilaterally consistent with scarring.  IMPRESSION: 1. Prior CABG. 2. Persistent changes of congestive heart failure with bilateral from interstitial edema. Persistent bilateral pleural parenchymal scarring.   Electronically Signed  By: [place]   On: 01/18/2015 07:24    Medications: I have reviewed the patient's current medications. Scheduled Meds: . acyclovir  400 mg Oral Daily  . allopurinol  300 mg Oral Daily  . aspirin EC  81 mg Oral Daily  . clopidogrel  75 mg Oral Q breakfast  . fondaparinux (ARIXTRA) injection  2.5 mg Subcutaneous Q0600  . furosemide  60 mg Intravenous 3 times per day  . hydrALAZINE  50 mg Oral 3 times per day  . ipratropium-albuterol   3 mL Nebulization TID  . lidocaine-prilocaine   Topical Once  . methylPREDNISolone (SOLU-MEDROL) injection  40 mg Intravenous Q12H  . metoprolol succinate  100 mg Oral Daily  . polyethylene glycol  17 g Oral Daily  . potassium chloride  20 mEq Oral BID  . senna-docusate  2 tablet Oral QHS  . simvastatin  20 mg Oral q1800  . triamcinolone ointment   Topical TID   Continuous Infusions:  PRN Meds:.acetaminophen **OR** acetaminophen, albuterol, bisacodyl, clonazePAM, magnesium hydroxide, morphine injection, sodium chloride, sodium phosphate, traMADol  Assessment/Plan: Active Problems:   Cellulitis   Cellulitis of right lower extremity   Cellulitis of left lower extremity   Erythroderma   Purpura   [facility] spotted fever)   Adverse drug reaction   Follicular lymphoma   Congestive heart disease   Chronic diastolic CHF (congestive heart failure)   Thrombocytopenia   Coronary artery disease involving coronary bypass graft of native heart with unstable angina pectoris   Acute on chronic diastolic congestive heart failure   Vasculitis   Benign essential HTN   Leukocytoclastic vasculitis  CHF- diastolic HF despite echo readings: With normal EF, responding to IV lasix 60 mg TID. EF 43-15%, LV diastolic function parameters were normal. Continue with diuretics may need nuc study as outpt. But negative troponin and echo stable. No acute EKG changes. since admit negative 9,031.  Cr yesterday 0.84 Pt wanting to go home.  Resume 100 mg of demadex.    Pt did not wish early follow up as he is being seen by oncology.  He now has appt with Dr. Johnsie Cancel in August but should call for increased swelling and we will see.   HTN - BP elevated 168/52 on hydralazine 50 mg every 8 hours, toprol 100 daily in addition to the lasix.   Aorta: Dilated aortic root and ascending aorta. Aortic root dimension: 37 mm (ED). Ascending aortic diameter: 41 mm (S).  Next chemo is scheduled for  Thursday  Skin biopsy Leukocytoclastic vasculitis.  Last dose ABX today, continue steroids.  Dr. Alvy Bimler contemplating possibility of bendamustine induced vasculitic rash though she feels it is not typical.  ID referring to derm. rheumatology for recommendations.  CAD, no chest pain and neg troponin, CABG was 2003, last cath 2013 PTCA and stenting of the saphenous vein graft to PDA and saphenous vein graft to obtuse marginal for severe stenosis. Slow flow down the grafts and patent LIMA.  Tobacco use - we discussed his need to stop smoking. His wife states he has stopped smoking.  NSVT- few bursts with normal EF   LOS: 7 days   Time spent with pt. :15 minutes. [facility] R  Nurse Practitioner Certified Pager [phone] or after 5pm and on weekends call [phone] 01/19/2015, 8:04 AM   I have examined the patient and reviewed assessment and plan and discussed with patient.  Agree with above as stated.  Appears close to euvolemic.  D/w Dr. Sloan Leiter.  Ok to discharge from a cardiac standpoint. Continue home diuretics.  [name] S.

## 2015-01-19 NOTE — Discharge Instructions (Signed)
Weigh daily Call [phone] if weight climbs more than 3 pounds in a day or 5 pounds in a week. No salt to very little salt in your diet.  No more than 2000 mg in a day. Call if increased shortness of breath or increased swelling.   If you have increased swelling please call to be seen, we can see in a flex clinic.    Stop smoking

## 2015-01-19 NOTE — Care Management Note (Signed)
Case Management Note  Patient Details  Name: [name] MRN: [id number] Date of Birth: 06/26/1956  Subjective/Objective:                    Action/Plan:d/c home no d/c needs or orders.   Expected Discharge Date:   (unknown)               Expected Discharge Plan:  Home/Self Care  In-House Referral:     Discharge planning Services  CM Consult  Post Acute Care Choice:    Choice offered to:     DME Arranged:    DME Agency:     HH Arranged:    [place] Agency:     Status of Service:  Completed, signed off  Medicare Important Message Given:    Date Medicare IM Given:    Medicare IM give by:    Date Additional Medicare IM Given:    Additional Medicare Important Message give by:     If discussed at [place] of Stay Meetings, dates discussed:    Additional Comments:  Dessa Phi, RN 01/19/2015, 10:40 AM

## 2015-01-19 NOTE — Care Management Note (Signed)
Case Management Note  Patient Details  Name: [name] MRN: [id number] Date of Birth: [birth year]  Subjective/Objective:                    Action/Plan:   Expected Discharge Date:   (unknown)               Expected Discharge Plan:  Home/Self Care  In-House Referral:     Discharge planning Services  CM Consult  Post Acute Care Choice:    Choice offered to:     DME Arranged:    DME Agency:     HH Arranged:    [place] Agency:     Status of Service:  Completed, signed off  Medicare Important Message Given:    Date Medicare IM Given:    Medicare IM give by:    Date Additional Medicare IM Given:    Additional Medicare Important Message give by:     If discussed at [place] of Stay Meetings, dates discussed: 01/19/15   Additional Comments:  Dessa Phi, RN 01/19/2015, 11:29 AM

## 2015-01-19 NOTE — Progress Notes (Signed)
[name]   DOB:10-01-1955   VQ#:[id number]    I have seen the patient, examined him and edited the notes as follows  Subjective: He feels better from prior day. Denies any fevers or chills. His shortness of breath has improved. Denies cough. Denies hemoptysis. Denies chest pain. Appetite is normal. Denies nausea or vomiting. Denies abdominal pain. Last bowel movement on 6/6. Denies urinary complaints. Some of the blisters on the right lower extremity has opened and he is draining serosanguineous fluid. His overall body redness and rash receding. His pain appears to be under control. He denies itchiness.  Scheduled Meds: . acyclovir  400 mg Oral Daily  . allopurinol  300 mg Oral Daily  . aspirin EC  81 mg Oral Daily  . clopidogrel  75 mg Oral Q breakfast  . fondaparinux (ARIXTRA) injection  2.5 mg Subcutaneous Q0600  . furosemide  60 mg Intravenous 3 times per day  . hydrALAZINE  50 mg Oral 3 times per day  . ipratropium-albuterol  3 mL Nebulization TID  . lidocaine-prilocaine   Topical Once  . methylPREDNISolone (SOLU-MEDROL) injection  40 mg Intravenous Q12H  . metoprolol succinate  100 mg Oral Daily  . polyethylene glycol  17 g Oral Daily  . potassium chloride  20 mEq Oral BID  . senna-docusate  2 tablet Oral QHS  . simvastatin  20 mg Oral q1800  . triamcinolone ointment   Topical TID   Continuous Infusions:  PRN Meds:.acetaminophen **OR** acetaminophen, albuterol, bisacodyl, clonazePAM, magnesium hydroxide, morphine injection, sodium chloride, sodium phosphate, traMADol   Objective:  Filed Vitals:   01/19/15 0435  BP: 168/52  Pulse: 62  Temp: 98 F (36.7 C)  Resp: 20     Intake/Output Summary (Last 24 hours) at 01/19/15 0757 Last data filed at 01/19/15 0136  Gross per 24 hour  Intake    100 ml  Output   3550 ml  Net  -3450 ml    GENERAL:alert, no distress and comfortable SKIN: His skin redness has resolved. There were blisters in his legs have open with  serosanguineous discharge. Overall, the cellulitis/skin rash are improving.  EYES: normal, Conjunctiva are pink and non-injected, sclera clear OROPHARYNX:no exudate, no erythema and lips, buccal mucosa, and tongue normal  NECK: supple, thyroid normal size, non-tender, without nodularity LYMPH:  no palpable lymphadenopathy in the cervical, axillary or inguinal LUNGS: No increased work of breathing. He had bilateral expiratory, bibasilar trace crackles, no rhonchi. Respiratory effort imprved HEART: regular rate & rhythm and no murmurs with moderate bilateral  lower extremity edema ABDOMEN:abdomen soft, non-tender and normal bowel sounds Musculoskeletal:no cyanosis of digits and no clubbing  NEURO: alert & oriented x 3 with fluent speech, no focal motor/sensory deficits   Labs:  Lab Results  Component Value Date   WBC 15.3* 01/19/2015   HGB 14.0 01/19/2015   HCT 45.2 01/19/2015   MCV 102.3* 01/19/2015   PLT 175 01/19/2015   NEUTROABS 8.1* 12/28/2014    Lab Results  Component Value Date   NA 139 01/18/2015   K 4.8 01/18/2015   CL 95* 01/18/2015   CO2 38* 01/18/2015   Imaging Studies: I reviewed the CXR Chest 2 View  6/5/2016COMPARISON: 09/13/2011 FINDINGS: RIGHT jugular Port-A-Cath with tip projecting over SVC. Enlargement of cardiac silhouette post CABG. Pulmonary vascular congestion. Interstitial infiltrates bilaterally favor pulmonary edema and CHF. Scattered mid lung scarring and underlying emphysematous changes. No acute infiltrate, pleural effusion or pneumothorax. IMPRESSION: Probable CHF superimposed with COPD.  Assessment & Plan:  Follicular Lymphoma, Grade 3a S/p Cycle 1 D1on 5/19 with Rituximab and Bendamustine and every 28 days Continue supportive care. Case was presented at the hematology tumor board and certainly drug-induced skin reaction is a possibility. Plan to continue on prednisone therapy. I will see him next week to reassess. I will consider  switching his treatment to maybe R-CVP or rituximab with Revlimid in the future  Bilateral Leg Cellulitis and diffuse skin rash Leukocytoclastic vasculitis, drug induced Etiology unknown, in the setting of recent chemotherapy. Skin biopsy consistent with leukocystoclastic vasculitis Clinically, this does not appear to be consistent with side effects of bendamustine. He completed course of IV antibiotics Continue steroids as per ID recommendations  Bilateral leg edema In the setting of cellulitis and CHF Bilateral lower extremity dopplers are negative for DVT Slowly improving Continue antibiotics and diuresis, as well as leg elevation  Thrombocytopenia, resolved This is due to recent chemotherapy, infection, antibiotics Monitor counts closely He is on Baby Aspirin and Plavix No transfusion is indicated at this time, platelets at 175,000  Leukocytosis This is likely reactive due to possible infection and steroids No intervention is indicated at this time Will continue to monitor  Increasing shortness of breath on exertion, improved Chest x-ray remarkable for pulmonary vascular congestion due to CHF and superimposed COPD He was placed on IV Lasix and fluid restriction with improvement of symptoms He is on steroids, regular nebulizers and completed antibiotic therapy. Continue oxygen while ambulating and as needed Status improving Appreciate cardiology consultation.  Full Code  Other medical issues including CHF, CAD, Sleep apnea, gout, GERD as per admitting team Will sign off Rondel Jumbo, PA-C 01/19/2015  7:57 AM Isidore Margraf, MD 01/19/2015

## 2015-01-19 NOTE — Discharge Summary (Signed)
PATIENT DETAILS Name: [name] Age: 59 y.o. Sex: male Date of Birth: 1956-01-06 MRN: [id number]. Admitting Physician: Hosie Poisson, MD GGY:IRSW Plotnikov, MD  Admit Date: 01/12/2015 Discharge date: 01/19/2015  Recommendations for Outpatient Follow-up:  1. Repeat cbc/bmet at next visit 2. Prednisone 4 week taper.   PRIMARY DISCHARGE DIAGNOSIS:  Active Problems:   Cellulitis   Cellulitis of right lower extremity   Cellulitis of left lower extremity   Erythroderma   Purpura   RMSF [facility] spotted fever)   Adverse drug reaction   Follicular lymphoma   Congestive heart disease   Chronic diastolic CHF (congestive heart failure)   Thrombocytopenia   Coronary artery disease involving coronary bypass graft of native heart with unstable angina pectoris   Acute on chronic diastolic congestive heart failure   Vasculitis   Benign essential HTN   Leukocytoclastic vasculitis      PAST MEDICAL HISTORY: Past Medical History  Diagnosis Date  . Heparin induced thrombocytopenia   . Hypertension   . Hyperlipidemia   . Obesity   . Edema   . COPD (chronic obstructive pulmonary disease)   . GERD (gastroesophageal reflux disease)   . Basal cell carcinoma of nose   . Chronic lower back pain   . Depression   . Myocardial infarction   . Heart murmur   . Dysrhythmia     fluttering  . Anginal pain     not had to use in awhile  none in 10 years  . CHF (congestive heart failure)   . Shortness of breath dyspnea     with exertion   . Diabetes mellitus without complication     borderline   . Anxiety   . Blood dyscrasia     trouble clotting   . Coronary artery disease     CABG 2005. s/p PTCA and stenting of the saphenous vein graft to PDA and saphenous vein graft to obtuse marginal by Dr. Burt Knack 09/29/11. Normal EF at cath 09/2011  . Sleep apnea     "used to"      could not use 2006  . Carotid artery disease     s/p L CEA 2009 (hx of evacuation of hematoma due to heparin)    . Lymphoma 12/21/2014    DISCHARGE MEDICATIONS: Current Discharge Medication List    START taking these medications   Details  hydrALAZINE (APRESOLINE) 50 MG tablet Take 1 tablet (50 mg total) by mouth every 8 (eight) hours. Qty: 90 tablet, Refills: 0    oxyCODONE-acetaminophen (PERCOCET/ROXICET) 5-325 MG per tablet Take 1 tablet by mouth every 6 (six) hours as needed for moderate pain. Qty: 30 tablet, Refills: 0    predniSONE (DELTASONE) 10 MG tablet Take 4 tablets (40 mg) daily for one week, then, Take 3 tablets (30 mg) daily for one week,then, Take 2 tablets (20 mg) daily for one week, then, Take 1 tablet   (10 mg) daily for one week and then stop Qty: 70 tablet, Refills: 0    triamcinolone ointment (KENALOG) 0.1 % Apply topically 3 (three) times daily. Qty: 30 g, Refills: 0      CONTINUE these medications which have NOT CHANGED   Details  acyclovir (ZOVIRAX) 400 MG tablet Take 1 tablet (400 mg total) by mouth daily. Qty: 30 tablet, Refills: 3   Associated Diagnoses: Follicular lymphoma grade 3a    albuterol (PROVENTIL) (2.5 MG/3ML) 0.083% nebulizer solution Take 3 mLs (2.5 mg total) by nebulization every 2 (two) hours as needed  for wheezing. Qty: 75 mL, Refills: 2    allopurinol (ZYLOPRIM) 300 MG tablet Take 1 tablet (300 mg total) by mouth daily. Qty: 30 tablet, Refills: 3   Associated Diagnoses: Follicular lymphoma grade 3a    aspirin EC 81 MG tablet Take 81 mg by mouth daily.    clonazePAM (KLONOPIN) 0.5 MG tablet TAKE 1 TABLET BY MOUTH TWICE A DAY AS NEEDED FOR ANXIETY or at hs for insomnia Qty: 180 tablet, Refills: 0    clopidogrel (PLAVIX) 75 MG tablet TAKE 1 TABLET BY MOUTH DAILY WITH BREAKFAST. Qty: 90 tablet, Refills: 3    lidocaine-prilocaine (EMLA) cream Apply to affected area once Qty: 30 g, Refills: 3   Associated Diagnoses: Follicular lymphoma grade 3a    metoprolol succinate (TOPROL-XL) 50 MG 24 hr tablet TAKE 2 TABLETS EVERY DAY Qty: 180 tablet,  Refills: 3    ondansetron (ZOFRAN) 8 MG tablet Take 1 tablet (8 mg total) by mouth every 8 (eight) hours as needed. Qty: 30 tablet, Refills: 1   Associated Diagnoses: Follicular lymphoma grade 3a    potassium chloride SA (KLOR-CON M20) 20 MEQ tablet TAKE 1 TABLET BY MOUTH DAILY Qty: 90 tablet, Refills: 3    simvastatin (ZOCOR) 20 MG tablet TAKE 1 TABLET (20 MG TOTAL) BY MOUTH AT BEDTIME. Qty: 90 tablet, Refills: 3    torsemide (DEMADEX) 100 MG tablet Take 100 mg by mouth daily.    naproxen sodium (ANAPROX) 220 MG tablet Take 1 tablet (220 mg total) by mouth as needed. Qty: 60 tablet, Refills: 2    nitroGLYCERIN (NITROSTAT) 0.4 MG SL tablet Place 1 tablet (0.4 mg total) under the tongue every 5 (five) minutes as needed for chest pain (Up to 3 doses). Qty: 25 tablet, Refills: 4    prochlorperazine (COMPAZINE) 10 MG tablet Take 1 tablet (10 mg total) by mouth every 6 (six) hours as needed (Nausea or vomiting). Qty: 30 tablet, Refills: 1   Associated Diagnoses: Follicular lymphoma grade 3a    tiotropium (SPIRIVA) 18 MCG inhalation capsule Place 1 capsule (18 mcg total) into inhaler and inhale daily. Qty: 30 capsule, Refills: 2      STOP taking these medications     traMADol (ULTRAM) 50 MG tablet         ALLERGIES:   Allergies  Allergen Reactions  . Heparin Other (See Comments)    Opposite reaction Heparin induced thrombocytopenia  . Tape Rash    BRIEF HPI:  See H&P, Labs, Consult and Test reports for all details in brief, patient is a 59 year old male with history of grade 3 follicular lymphoma status post first cycle of Rituximab and Bendamustine on 12/31/14 presented to the hospital on 5/31 with bilateral lower extremity swelling and erythema. Patient was initially thought to have cellulitis and started on empiric vancomycin and Zosyn. However, retrospectively thought to have probably vasculitis/allergic reaction to his most recent chemotherapy  CONSULTATIONS:    cardiology, ID and hematology/oncology  PERTINENT RADIOLOGIC STUDIES: Dg Chest 2 View  01/17/2015   CLINICAL DATA:  Shortness of breath today, BILATERAL lower extremity swelling after chemotherapy last Friday, history lymphoma, COPD, MI, CHF, diabetes, hypertension, GERD  EXAM: CHEST  2 VIEW  COMPARISON:  09/13/2011  FINDINGS: RIGHT jugular Port-A-Cath with tip projecting over SVC.  Enlargement of cardiac silhouette post CABG.  Pulmonary vascular congestion.  Interstitial infiltrates bilaterally favor pulmonary edema and CHF.  Scattered mid lung scarring and underlying emphysematous changes.  No acute infiltrate, pleural effusion or pneumothorax.  IMPRESSION: Probable  CHF superimposed with COPD.   Electronically Signed   By: Lavonia Dana M.D.   On: 01/17/2015 15:13   Nm Pet Image Initial (pi) Skull Base To Thigh  12/25/2014   CLINICAL DATA:  Initial treatment strategy for lymphoma.  EXAM: NUCLEAR MEDICINE PET SKULL BASE TO THIGH  TECHNIQUE: 16.0 mCi F-18 FDG was injected intravenously. Full-ring PET imaging was performed from the skull base to thigh after the radiotracer. CT data was obtained and used for attenuation correction and anatomic localization.  FASTING BLOOD GLUCOSE:  Value: 121 mg/dl  COMPARISON:  06/24/2009  FINDINGS: NECK  The superficial left supraclavicular lymph node measuring 4.4 by 4.6 cm on image 40 of series 4 has maximum standard uptake value 12.2, Deauville 5. There is a small amount of gas within this lesion suggesting recent biopsy.  Symmetric tonsillar activity. Small amount of activity superficial to the right submandibular gland is probably vascular. Probable sebaceous cyst along the right posterior lower neck, image 38 series 4, photopenic.  CHEST  Left upper prevascular lymph node measuring 1.5 cm in short axis on image 66 of series 4 has maximum standard uptake value 7.9, [place]ville 4. A small right upper paratracheal lymph node measuring 0.9 cm in short axis on image 60 of series  4 has maximum standard uptake value 4.3, Deauville 3.  Calcified pleural plaques noted. Background vascular mediastinal activity approximately 3.6.  Other chest findings include coronary artery atherosclerosis, cardiomegaly, calcified pleural plaques (left greater than right), and bilateral airway thickening. Prior CABG.  ABDOMEN/PELVIS  Background hepatic activity 4.4. Portacaval lymph node measuring 1.6 cm in short axis on image 133 of series 4 has maximum standard uptake value 7.2, Deauville 4. A a lymph node adjacent to the left adrenal gland measuring 1.9 cm in short axis on image 123 of series 4 has maximum standard uptake value 6.7, Deauville 4. At the level of the left renal vessels, a left periaortic lymph node with maximum standard uptake value 7.1 measures 1.3 cm in short axis (image 136, series 4), Deauville 4.  Bilateral hypermetabolic inguinal lymph nodes are present. A right inguinal lymph node measuring 1.3 cm in short axis on image 218 of series 4 has maximum standard uptake value 4.2, Deauville 3  Other abdominal findings include cholelithiasis, bilateral nonobstructive nephrolithiasis, and aortoiliac atherosclerosis.  SKELETON  Heterogeneous marrow activity noted but without definite focal bony lesion. Incidental note is made of right antecubital activity, maximum standard uptake value 8.6, probably injection related (this is the site of injection. ). Bridging spurring of both sacroiliac joints noted.  IMPRESSION: 1. Scattered hypermetabolic adenopathy in the neck, chest, abdomen, and inguinal regions, compatible with active lymphoma. 2. Airway thickening is present, suggesting bronchitis or reactive airways disease. 3. Scattered heterogeneous marrow activity without a focal lesion, uncertain significance. 4. Other findings include calcified pleural plaques left greater than right ; prior CABG; cardiomegaly; cholelithiasis; and bilateral nonobstructive nephrolithiasis.   Electronically Signed    By: Van Clines M.D.   On: 12/25/2014 09:09   Ir Fluoro Guide Cv Line Right  12/28/2014   CLINICAL DATA:  Lymphoma and need for porta cath to begin chemotherapy.  EXAM: IMPLANTED PORT A CATH PLACEMENT WITH ULTRASOUND AND FLUOROSCOPIC GUIDANCE  ANESTHESIA/SEDATION: 4.5 Mg IV Versed; 100 mcg IV Fentanyl  Total Moderate Sedation Time:  36 minutes.  Additional Medications: 3 g IV Ancef. As antibiotic prophylaxis, Ancef was ordered pre-procedure and administered intravenously within one hour of incision.  FLUOROSCOPY TIME:  18 seconds.  PROCEDURE: The procedure, risks, benefits, and alternatives were explained to the patient. Questions regarding the procedure were encouraged and answered. The patient understands and consents to the procedure. A time-out was performed prior to the procedure.  Ultrasound was used to confirm patency of the right internal jugular vein. The right neck and chest were prepped with chlorhexidine in a sterile fashion, and a sterile drape was applied covering the operative field. Maximum barrier sterile technique with sterile gowns and gloves were used for the procedure. Local anesthesia was provided with 1% lidocaine.  After creating a small venotomy incision, a 21 gauge needle was advanced into the right internal jugular vein under direct, real-time ultrasound guidance. Ultrasound image documentation was performed. After securing guidewire access, an 8 Fr dilator was placed. A J-wire was kinked to measure appropriate catheter length.  A subcutaneous port pocket was then created along the upper chest wall utilizing sharp and blunt dissection. Portable cautery was utilized. The pocket was irrigated with sterile saline.  A single lumen power injectable port was chosen for placement. The 8 Fr catheter was tunneled from the port pocket site to the venotomy incision. The port was placed in the pocket. External catheter was trimmed to appropriate length based on guidewire measurement.  At the  venotomy, an 8 Fr peel-away sheath was placed over a guidewire. The catheter was then placed through the sheath and the sheath removed. Final catheter positioning was confirmed and documented with a fluoroscopic spot image. The port was accessed with a needle and aspirated and flushed with saline. The needle was removed.  The venotomy and port pocket incisions were closed with subcutaneous 3-0 Monocryl and subcuticular 4-0 Vicryl. Dermabond was applied to both incisions.  COMPLICATIONS: None  FINDINGS: After catheter placement, the tip lies at the cavoatrial junction. The catheter aspirates normally and is ready for immediate use.  IMPRESSION: Placement of single lumen port a cath via right internal jugular vein. The catheter tip lies at the cavoatrial junction. A power injectable port a cath was placed and is ready for immediate use.   Electronically Signed   By: Aletta Edouard M.D.   On: 12/28/2014 15:02   Ir US Guide Vasc Access Right  12/28/2014   CLINICAL DATA:  Lymphoma and need for porta cath to begin chemotherapy.  EXAM: IMPLANTED PORT A CATH PLACEMENT WITH ULTRASOUND AND FLUOROSCOPIC GUIDANCE  ANESTHESIA/SEDATION: 4.5 Mg IV Versed; 100 mcg IV Fentanyl  Total Moderate Sedation Time:  36 minutes.  Additional Medications: 3 g IV Ancef. As antibiotic prophylaxis, Ancef was ordered pre-procedure and administered intravenously within one hour of incision.  FLUOROSCOPY TIME:  18 seconds.  PROCEDURE: The procedure, risks, benefits, and alternatives were explained to the patient. Questions regarding the procedure were encouraged and answered. The patient understands and consents to the procedure. A time-out was performed prior to the procedure.  Ultrasound was used to confirm patency of the right internal jugular vein. The right neck and chest were prepped with chlorhexidine in a sterile fashion, and a sterile drape was applied covering the operative field. Maximum barrier sterile technique with sterile gowns  and gloves were used for the procedure. Local anesthesia was provided with 1% lidocaine.  After creating a small venotomy incision, a 21 gauge needle was advanced into the right internal jugular vein under direct, real-time ultrasound guidance. Ultrasound image documentation was performed. After securing guidewire access, an 8 Fr dilator was placed. A J-wire was kinked to measure appropriate catheter length.  A subcutaneous port pocket  was then created along the upper chest wall utilizing sharp and blunt dissection. Portable cautery was utilized. The pocket was irrigated with sterile saline.  A single lumen power injectable port was chosen for placement. The 8 Fr catheter was tunneled from the port pocket site to the venotomy incision. The port was placed in the pocket. External catheter was trimmed to appropriate length based on guidewire measurement.  At the venotomy, an 8 Fr peel-away sheath was placed over a guidewire. The catheter was then placed through the sheath and the sheath removed. Final catheter positioning was confirmed and documented with a fluoroscopic spot image. The port was accessed with a needle and aspirated and flushed with saline. The needle was removed.  The venotomy and port pocket incisions were closed with subcutaneous 3-0 Monocryl and subcuticular 4-0 Vicryl. Dermabond was applied to both incisions.  COMPLICATIONS: None  FINDINGS: After catheter placement, the tip lies at the cavoatrial junction. The catheter aspirates normally and is ready for immediate use.  IMPRESSION: Placement of single lumen port a cath via right internal jugular vein. The catheter tip lies at the cavoatrial junction. A power injectable port a cath was placed and is ready for immediate use.   Electronically Signed   By: Aletta Edouard M.D.   On: 12/28/2014 15:02   Dg Chest Port 1 View  01/18/2015   CLINICAL DATA:  Congestive heart failure.  EXAM: PORTABLE CHEST - 1 VIEW  COMPARISON:  01/17/2015 .  FINDINGS: Power  port catheter in stable position. Prior CABG. Persistent cardiomegaly with diffuse pulmonary interstitial prominence. Small pleural effusions cannot be excluded. These findings again consistent with congestive heart failure. Pneumonitis cannot be excluded. Stable pleural parenchymal thickening noted bilaterally consistent with scarring.  IMPRESSION: 1. Prior CABG. 2. Persistent changes of congestive heart failure with bilateral from interstitial edema. Persistent bilateral pleural parenchymal scarring.   Electronically Signed   By: Marcello Moores  Register   On: 01/18/2015 07:24     PERTINENT LAB RESULTS: CBC:  Recent Labs  01/18/15 0320 01/19/15 0200  WBC 18.4* 15.3*  HGB 14.2 14.0  HCT 45.1 45.2  PLT 143* 175   CMET CMP     Component Value Date/Time   NA 139 01/18/2015 0320   NA 140 12/21/2014 1414   K 4.8 01/18/2015 0320   K 4.1 12/21/2014 1414   CL 95* 01/18/2015 0320   CO2 38* 01/18/2015 0320   CO2 31* 12/21/2014 1414   GLUCOSE 159* 01/18/2015 0320   GLUCOSE 103 12/21/2014 1414   BUN 25* 01/18/2015 0320   BUN 15.0 12/21/2014 1414   CREATININE 0.84 01/18/2015 0320   CREATININE 0.9 12/21/2014 1414   CALCIUM 8.7* 01/18/2015 0320   CALCIUM 9.1 12/21/2014 1414   PROT 6.6 01/15/2015 0500   PROT 7.3 12/21/2014 1414   ALBUMIN 3.1* 01/15/2015 0500   ALBUMIN 3.9 12/21/2014 1414   AST 22 01/15/2015 0500   AST 21 12/21/2014 1414   ALT 27 01/15/2015 0500   ALT 28 12/21/2014 1414   ALKPHOS 57 01/15/2015 0500   ALKPHOS 83 12/21/2014 1414   BILITOT 0.6 01/15/2015 0500   BILITOT 0.67 12/21/2014 1414   GFRNONAA >60 01/18/2015 0320   GFRAA >60 01/18/2015 0320    GFR Estimated Creatinine Clearance: 144.8 mL/min (by C-G formula based on Cr of 0.84). No results for input(s): LIPASE, AMYLASE in the last 72 hours.  Recent Labs  01/17/15 2130 01/18/15 0320 01/18/15 0905  TROPONINI <0.03 <0.03 <0.03   Invalid input(s): POCBNP No  results for input(s): DDIMER in the last 72 hours. No  results for input(s): HGBA1C in the last 72 hours. No results for input(s): CHOL, HDL, LDLCALC, TRIG, CHOLHDL, LDLDIRECT in the last 72 hours. No results for input(s): TSH, T4TOTAL, T3FREE, THYROIDAB in the last 72 hours.  Invalid input(s): FREET3 No results for input(s): VITAMINB12, FOLATE, FERRITIN, TIBC, IRON, RETICCTPCT in the last 72 hours. Coags: No results for input(s): INR in the last 72 hours.  Invalid input(s): PT Microbiology: Recent Results (from the past 240 hour(s))  Culture, blood (routine x 2)     Status: None   Collection Time: 01/12/15  4:25 PM  Result Value Ref Range Status   Specimen Description BLOOD RIGHT ASSIST CONTROL  Final   Special Requests BOTTLES DRAWN AEROBIC AND ANAEROBIC BML  Final   Culture   Final    NO GROWTH 5 DAYS Performed at [facility]    Report Status 01/18/2015 FINAL  Final  Culture, blood (routine x 2)     Status: None   Collection Time: 01/12/15  4:32 PM  Result Value Ref Range Status   Specimen Description BLOOD LEFT ANTECUBITAL  Final   Special Requests BOTTLES DRAWN AEROBIC AND ANAEROBIC 5 CC EA  Final   Culture   Final    NO GROWTH 5 DAYS Performed at [facility]    Report Status 01/18/2015 FINAL  Final  Fungus culture, blood     Status: None (Preliminary result)   Collection Time: 01/14/15 11:20 AM  Result Value Ref Range Status   Specimen Description BLOOD RIGHT ARM  Final   Special Requests BOTTLES DRAWN AEROBIC ONLY 10CC  Final   Culture   Final    NO FUNGUS ISOLATED;CULTURE IN PROGRESS FOR 7 DAYS Note: Culture results may be compromised due to an excessive volume of blood received in culture bottles. Performed at [facility]    Report Status PENDING  Incomplete  Culture, blood (routine x 2)     Status: None (Preliminary result)   Collection Time: 01/14/15 11:20 AM  Result Value Ref Range Status   Specimen Description BLOOD RIGHT ARM  Final   Special Requests BOTTLES DRAWN AEROBIC AND  ANAEROBIC 10CC  Final   Culture   Final           BLOOD CULTURE RECEIVED NO GROWTH TO DATE CULTURE WILL BE HELD FOR 5 DAYS BEFORE ISSUING A FINAL NEGATIVE REPORT Performed at [facility]    Report Status PENDING  Incomplete  Culture, blood (routine x 2)     Status: None (Preliminary result)   Collection Time: 01/14/15 11:29 AM  Result Value Ref Range Status   Specimen Description BLOOD RIGHT HAND  Final   Special Requests BOTTLES DRAWN AEROBIC ONLY 5CC  Final   Culture   Final           BLOOD CULTURE RECEIVED NO GROWTH TO DATE CULTURE WILL BE HELD FOR 5 DAYS BEFORE ISSUING A FINAL NEGATIVE REPORT Performed at [facility]    Report Status PENDING  Incomplete     BRIEF HOSPITAL COURSE:  Brief Narrative 59 year old male with history of grade 3 follicular lymphoma status post first cycle of Rituximab and Bendamustine on 12/31/14 presented to the hospital on 5/31 with bilateral lower extremity swelling and erythema. Patient was initially thought to have cellulitis and started on empiric vancomycin and Zosyn. However, retrospectively thought to have probably vasculitis/allergic reaction to his most recent chemotherapy. Skin biopsy done on  6/oh confirmed leukocytoclastic vasculitis. Started on empiric IV steroids with significant improvement. Unfortunately hospital course complicated by development of acute diastolic heart failure. Oncology, infectious disease and cardiology were consulted during this hospital course  Hospital course by Problem List: Bilateral lower extremity Rash: Doubt that this is cellulitis, suspect that this is more of a vasculitis/allergic reaction to his most recent chemotherapy. Empirically started on Solu-Medrol, antibiotics narrowed down to Ancef. Rash now improved-Some erythema has started to clear-diddevelop a few blisters in the right lower extremity that have opened into superficial ulcerations. Punch biopsy done on 6/2 confirms leukocytoclastic  vasculitis,  taper Solu-Medrol down to 40 mg twice a day by discharged, spoke with patient's Dermatologist Dr Jarome Matin over the phone who recommended a 4 week taper.Have asked patient to follow up with Dr Ronnald Ramp in a week or so.  Acute diastolic heart failure: Suspect decompensation secondary to excessive fluid intake/steroids. Managed with IV Lasix. Cardiology consulted, has diuresed well, cardiology recommends patient be placed back on Demadex 100 mg daily on discharge. Compensated at the time of discharge.  History of lymphoma: Recent chemotherapy with Rituximab and Bendamustine. Oncology following- who plans to discuss case at tumor board-before offering any more chemotherapy especially in light of biopsy showing leukocytoclastic vasculitis.  Thrombocytopenia: Likely secondary to recent chemotherapy.Improving upto 124K- check CBC periodically  Leukocytosis: Likely from steroids.Follow closely as outpatient.   History of CAD status post CABG in 2005 and PCI in 2013: Continue aspirin/Plavix, statin and metoprolol. Stable throughout this hospital stay  History of carotid artery disease-status post left carotid endarterectomy in 2009:continue aspirin/Plavix and statin.  History of heparin-induced thrombocytopenia: Avoid Lovenox/heparin  History of obstructive sleep apnea: Offer CPAP daily at bedtime-refuses  History of gout: Continue with allopurinol-currently stable.  HTN:BP better controlled -continue Metoprolol-add hydralazine  GERD: Continue PPI  TODAY-DAY OF DISCHARGE:  Subjective:   Ray Gervasi today has no headache,no chest abdominal pain,no new weakness tingling or numbness, feels much better wants to go home today.   Objective:   Blood pressure 135/57, pulse 86, temperature 98.1 F (36.7 C), temperature source Oral, resp. rate 18, height 6' (1.829 m), weight 157.444 kg (347 lb 1.6 oz), SpO2 96 %.  Intake/Output Summary (Last 24 hours) at 01/19/15 1426 Last data filed  at 01/19/15 1404  Gross per 24 hour  Intake    100 ml  Output   4550 ml  Net  -4450 ml   Filed Weights   01/15/15 0950 01/18/15 0540 01/19/15 0435  Weight: 160.21 kg (353 lb 3.2 oz) 158.714 kg (349 lb 14.4 oz) 157.444 kg (347 lb 1.6 oz)    Exam Awake Alert, Oriented *3, No new F.N deficits, Normal affect [place].AT,PERRAL Supple Neck,No JVD, No cervical lymphadenopathy appriciated.  Symmetrical Chest wall movement, Good air movement bilaterally, CTAB RRR,No Gallops,Rubs or new Murmurs, No Parasternal Heave +ve B.Sounds, Abd Soft, Non tender, No organomegaly appriciated, No rebound -guarding or rigidity. No Cyanosis, Clubbing or edema, No new Rash or bruise Rash in b/l lower extremities has started to clear up-much improved  DISCHARGE CONDITION: Stable  DISPOSITION: Home  DISCHARGE INSTRUCTIONS:    Activity:  As tolerated   Diet recommendation: Diabetic Diet  Discharge Instructions    (HEART FAILURE PATIENTS) Call MD:  Anytime you have any of the following symptoms: 1) 3 pound weight gain in 24 hours or 5 pounds in 1 week 2) shortness of breath, with or without a dry hacking cough 3) swelling in the hands, feet or stomach 4)  if you have to sleep on extra pillows at night in order to breathe.    Complete by:  As directed      Diet - low sodium heart healthy    Complete by:  As directed      Increase activity slowly    Complete by:  As directed            Follow-up Information    Follow up with Jenkins Rouge, MD On 04/01/2015.   Specialty:  Cardiology   Why:  at 11:00 AM   Contact information:   [address] [place] [address] [phone]       Follow up with Walker Kehr, MD. Go on 01/27/2015.   Specialty:  Internal Medicine   Why:  at 2:30pm   Contact information:   [place] [address] [phone]       Follow up with JONES,DREW A, MD. Schedule an appointment as soon as possible for a visit in 1 week.   Specialty:   Dermatology   Contact information:   [place] [address] [phone]       Follow up with [facility], NI, MD.   Specialty:  Hematology and Oncology   Why:  office will call   Contact information:   [place] [id number] [phone]         Total Time spent on discharge equals 45 minutes.  SignedOren Binet 01/19/2015 2:26 PM

## 2015-01-20 ENCOUNTER — Encounter: Payer: Self-pay | Admitting: Hematology and Oncology

## 2015-01-20 LAB — CULTURE, BLOOD (ROUTINE X 2): Culture: NO GROWTH

## 2015-01-20 NOTE — Progress Notes (Signed)
I placed fmla forms for shirley(wife) of patient on desk of nurse for dr. Alvy Bimler.

## 2015-01-21 LAB — CULTURE, BLOOD (ROUTINE X 2): Culture: NO GROWTH

## 2015-01-21 LAB — CRYOGLOBULIN

## 2015-01-22 ENCOUNTER — Ambulatory Visit (INDEPENDENT_AMBULATORY_CARE_PROVIDER_SITE_OTHER): Payer: BLUE CROSS/BLUE SHIELD | Admitting: Emergency Medicine

## 2015-01-22 VITALS — BP 120/62 | HR 73 | Temp 98.4°F | Resp 17 | Ht 72.0 in | Wt 334.0 lb

## 2015-01-22 DIAGNOSIS — L03119 Cellulitis of unspecified part of limb: Secondary | ICD-10-CM

## 2015-01-22 DIAGNOSIS — I776 Arteritis, unspecified: Secondary | ICD-10-CM | POA: Diagnosis not present

## 2015-01-22 DIAGNOSIS — C8290 Follicular lymphoma, unspecified, unspecified site: Secondary | ICD-10-CM

## 2015-01-22 LAB — FUNGUS CULTURE, BLOOD: Culture: NO GROWTH

## 2015-01-22 NOTE — Patient Instructions (Signed)

## 2015-01-22 NOTE — Progress Notes (Signed)
Subjective:  Patient ID: [name], male    DOB: 1955/12/22  Age: 59 y.o. MRN: [id number]  CC: Medical Clearance   HPI [name] presents  for a work note. He was hospitalized with cellulitis and a vasculitis of his lower extremities and has been discharged with a note written by the hospitalist return to work on Monday. His employer once a badly reevaluation and new work note. Since discharge. The patient has been improving. He has less pain and swelling in his lower extremities. The cellulitis is resolved leaving him with residual vasculitis which is purpuric rash on his lower extremities. The knees down he is under treatment for follicular lymphoma. His entire situation is complicated by his morbid obesity.  History [name] has a past medical history of Heparin induced thrombocytopenia; Hypertension; Hyperlipidemia; Obesity; Edema; COPD (chronic obstructive pulmonary disease); GERD (gastroesophageal reflux disease); Basal cell carcinoma of nose; Chronic lower back pain; Depression; Myocardial infarction; Heart murmur; Dysrhythmia; Anginal pain; CHF (congestive heart failure); Shortness of breath dyspnea; Diabetes mellitus without complication; Anxiety; Blood dyscrasia; Coronary artery disease; Sleep apnea; Carotid artery disease; and Lymphoma (12/21/2014).   He has past surgical history that includes evacuation of hematoma (03/2008); Coronary angioplasty with stent (09/29/11); Carotid endarterectomy (03/2008); Coronary artery bypass graft (2005); Excision basal cell carcinoma (2000's); percutaneous coronary stent intervention (pci-s) (N/A, 09/29/2011); Mass excision (Left, 12/11/2014); and Superficial lymph node biopsy / excision (Left).   His  family history includes Bone cancer in his mother; Heart disease in his father.  He   reports that he has quit smoking. His smoking use included Cigarettes. He has a 82 pack-year smoking history. He has quit using smokeless tobacco. His smokeless  tobacco use included Chew. He reports that he uses illicit drugs (Marijuana). He reports that he does not drink alcohol.  Outpatient Prescriptions Prior to Visit  Medication Sig Dispense Refill  . acyclovir (ZOVIRAX) 400 MG tablet Take 1 tablet (400 mg total) by mouth daily. 30 tablet 3  . albuterol (PROVENTIL) (2.5 MG/3ML) 0.083% nebulizer solution Take 3 mLs (2.5 mg total) by nebulization every 2 (two) hours as needed for wheezing. 75 mL 2  . allopurinol (ZYLOPRIM) 300 MG tablet Take 1 tablet (300 mg total) by mouth daily. 30 tablet 3  . aspirin EC 81 MG tablet Take 81 mg by mouth daily.    . clonazePAM (KLONOPIN) 0.5 MG tablet TAKE 1 TABLET BY MOUTH TWICE A DAY AS NEEDED FOR ANXIETY or at hs for insomnia 180 tablet 0  . clopidogrel (PLAVIX) 75 MG tablet TAKE 1 TABLET BY MOUTH DAILY WITH BREAKFAST. 90 tablet 3  . hydrALAZINE (APRESOLINE) 50 MG tablet Take 1 tablet (50 mg total) by mouth every 8 (eight) hours. 90 tablet 0  . lidocaine-prilocaine (EMLA) cream Apply to affected area once 30 g 3  . metoprolol succinate (TOPROL-XL) 50 MG 24 hr tablet TAKE 2 TABLETS EVERY DAY 180 tablet 3  . naproxen sodium (ANAPROX) 220 MG tablet Take 1 tablet (220 mg total) by mouth as needed. 60 tablet 2  . nitroGLYCERIN (NITROSTAT) 0.4 MG SL tablet Place 1 tablet (0.4 mg total) under the tongue every 5 (five) minutes as needed for chest pain (Up to 3 doses). 25 tablet 4  . ondansetron (ZOFRAN) 8 MG tablet Take 1 tablet (8 mg total) by mouth every 8 (eight) hours as needed. 30 tablet 1  . oxyCODONE-acetaminophen (PERCOCET/ROXICET) 5-325 MG per tablet Take 1 tablet by mouth every 6 (six) hours as needed  for moderate pain. 30 tablet 0  . potassium chloride SA (KLOR-CON M20) 20 MEQ tablet TAKE 1 TABLET BY MOUTH DAILY 90 tablet 3  . predniSONE (DELTASONE) 10 MG tablet Take 4 tablets (40 mg) daily for one week, then, Take 3 tablets (30 mg) daily for one week,then, Take 2 tablets (20 mg) daily for one week, then, Take  1 tablet   (10 mg) daily for one week and then stop 70 tablet 0  . prochlorperazine (COMPAZINE) 10 MG tablet Take 1 tablet (10 mg total) by mouth every 6 (six) hours as needed (Nausea or vomiting). 30 tablet 1  . simvastatin (ZOCOR) 20 MG tablet TAKE 1 TABLET (20 MG TOTAL) BY MOUTH AT BEDTIME. 90 tablet 3  . tiotropium (SPIRIVA) 18 MCG inhalation capsule Place 1 capsule (18 mcg total) into inhaler and inhale daily. 30 capsule 2  . torsemide (DEMADEX) 100 MG tablet Take 100 mg by mouth daily.    Marland Kitchen triamcinolone ointment (KENALOG) 0.1 % Apply topically 3 (three) times daily. 30 g 0   No facility-administered medications prior to visit.    History   Social History  . Marital Status: Married    Spouse Name: N/A  . Number of Children: 1  . Years of Education: N/A   Occupational History  . cable installer    Social History Main Topics  . Smoking status: Former Smoker -- 2.00 packs/day for 41 years    Types: Cigarettes  . Smokeless tobacco: Former Systems developer    Types: Chew     Comment: consult entered  . Alcohol Use: No     Comment: 09/29/11 "quit years ago"  . Drug Use: Yes    Special: Marijuana     Comment: "last time was in the 1990's"  . Sexual Activity: Not Currently   Other Topics Concern  . None   Social History Narrative     Review of Systems  Constitutional: Negative for fever, chills and appetite change.  HENT: Negative for congestion, ear pain, postnasal drip, sinus pressure and sore throat.   Eyes: Negative for pain and redness.  Respiratory: Negative for cough, shortness of breath and wheezing.   Cardiovascular: Negative for leg swelling.  Gastrointestinal: Negative for nausea, vomiting, abdominal pain, diarrhea, constipation and blood in stool.  Endocrine: Negative for polyuria.  Genitourinary: Negative for dysuria, urgency, frequency and flank pain.  Musculoskeletal: Negative for gait problem.  Skin: Positive for rash.  Neurological: Negative for weakness and  headaches.  Psychiatric/Behavioral: Negative for confusion and decreased concentration. The patient is not nervous/anxious.     Objective:  BP 120/62 mmHg  Pulse 73  Temp(Src) 98.4 F (36.9 C) (Oral)  Resp 17  Ht 6' (1.829 m)  Wt 334 lb (151.501 kg)  BMI 45.29 kg/m2  SpO2 95%  Physical Exam  Constitutional: He is oriented to person, place, and time. He appears well-developed and well-nourished. No distress.  HENT:  Head: Normocephalic and atraumatic.  Right Ear: External ear normal.  Left Ear: External ear normal.  Nose: Nose normal.  Eyes: Conjunctivae and EOM are normal. Pupils are equal, round, and reactive to light. No scleral icterus.  Neck: Normal range of motion. Neck supple. No tracheal deviation present.  Cardiovascular: Normal rate, regular rhythm and normal heart sounds.   Pulmonary/Chest: Effort normal. No respiratory distress. He has no wheezes. He has no rales.  Abdominal: He exhibits no mass. There is no tenderness. There is no rebound and no guarding.  Musculoskeletal: He exhibits no edema.  Lymphadenopathy:    He has no cervical adenopathy.  Neurological: He is alert and oriented to person, place, and time. Coordination normal.  Skin: Skin is warm and dry. Ecchymosis noted. No rash noted. There is erythema.  Psychiatric: He has a normal mood and affect. His behavior is normal.      Assessment & Plan:   [name] was seen today for medical clearance.  Diagnoses and all orders for this visit:  Vasculitis  Cellulitis of lower extremity, unspecified laterality  Follicular lymphoma   I am having [name] maintain his aspirin EC, nitroGLYCERIN, clopidogrel, metoprolol succinate, potassium chloride SA, naproxen sodium, simvastatin, torsemide, tiotropium, albuterol, clonazePAM, ondansetron, prochlorperazine, acyclovir, allopurinol, lidocaine-prilocaine, triamcinolone ointment, oxyCODONE-acetaminophen, hydrALAZINE, and predniSONE.  No orders of the defined  types were placed in this encounter.   Patient has dressed wounds on both lower legs just proximal to the ankle. He will he continues to have evidence of vasculitis. He is ambulating well and is eager to return to work. With no limitations. Appropriate red flag conditions were discussed with the patient as well as actions that should be taken.  Patient expressed his understanding.  Follow-up: Return if symptoms worsen or fail to improve.  Roselee Culver, MD

## 2015-01-27 ENCOUNTER — Other Ambulatory Visit (INDEPENDENT_AMBULATORY_CARE_PROVIDER_SITE_OTHER): Payer: BLUE CROSS/BLUE SHIELD

## 2015-01-27 ENCOUNTER — Ambulatory Visit (INDEPENDENT_AMBULATORY_CARE_PROVIDER_SITE_OTHER): Payer: BLUE CROSS/BLUE SHIELD | Admitting: Internal Medicine

## 2015-01-27 ENCOUNTER — Encounter: Payer: Self-pay | Admitting: Internal Medicine

## 2015-01-27 ENCOUNTER — Telehealth: Payer: Self-pay

## 2015-01-27 VITALS — BP 128/64 | HR 75 | Wt 335.0 lb

## 2015-01-27 DIAGNOSIS — C8230 Follicular lymphoma grade IIIa, unspecified site: Secondary | ICD-10-CM

## 2015-01-27 DIAGNOSIS — R739 Hyperglycemia, unspecified: Secondary | ICD-10-CM

## 2015-01-27 DIAGNOSIS — I25810 Atherosclerosis of coronary artery bypass graft(s) without angina pectoris: Secondary | ICD-10-CM

## 2015-01-27 DIAGNOSIS — G4733 Obstructive sleep apnea (adult) (pediatric): Secondary | ICD-10-CM

## 2015-01-27 DIAGNOSIS — I872 Venous insufficiency (chronic) (peripheral): Secondary | ICD-10-CM | POA: Diagnosis not present

## 2015-01-27 DIAGNOSIS — I5032 Chronic diastolic (congestive) heart failure: Secondary | ICD-10-CM | POA: Diagnosis not present

## 2015-01-27 DIAGNOSIS — J449 Chronic obstructive pulmonary disease, unspecified: Secondary | ICD-10-CM

## 2015-01-27 DIAGNOSIS — L03115 Cellulitis of right lower limb: Secondary | ICD-10-CM

## 2015-01-27 DIAGNOSIS — I776 Arteritis, unspecified: Secondary | ICD-10-CM

## 2015-01-27 LAB — CBC WITH DIFFERENTIAL/PLATELET
BASOS ABS: 0.1 10*3/uL (ref 0.0–0.1)
BASOS PCT: 0.3 % (ref 0.0–3.0)
Eosinophils Absolute: 0.1 10*3/uL (ref 0.0–0.7)
Eosinophils Relative: 0.4 % (ref 0.0–5.0)
HCT: 43.2 % (ref 39.0–52.0)
HEMOGLOBIN: 14.1 g/dL (ref 13.0–17.0)
Lymphocytes Relative: 6.1 % — ABNORMAL LOW (ref 12.0–46.0)
Lymphs Abs: 1.2 10*3/uL (ref 0.7–4.0)
MCHC: 32.6 g/dL (ref 30.0–36.0)
MCV: 96.9 fl (ref 78.0–100.0)
MONOS PCT: 9 % (ref 3.0–12.0)
Monocytes Absolute: 1.8 10*3/uL — ABNORMAL HIGH (ref 0.1–1.0)
NEUTROS ABS: 16.9 10*3/uL — AB (ref 1.4–7.7)
NEUTROS PCT: 84.2 % — AB (ref 43.0–77.0)
Platelets: 180 10*3/uL (ref 150.0–400.0)
RBC: 4.46 Mil/uL (ref 4.22–5.81)
RDW: 14.3 % (ref 11.5–15.5)
WBC: 20.1 10*3/uL (ref 4.0–10.5)

## 2015-01-27 LAB — BASIC METABOLIC PANEL
BUN: 25 mg/dL — ABNORMAL HIGH (ref 6–23)
CALCIUM: 9.2 mg/dL (ref 8.4–10.5)
CO2: 36 meq/L — AB (ref 19–32)
CREATININE: 1.05 mg/dL (ref 0.40–1.50)
Chloride: 99 mEq/L (ref 96–112)
GFR: 76.34 mL/min (ref >60.00)
Glucose, Bld: 99 mg/dL (ref 70–99)
Potassium: 3.7 mEq/L (ref 3.5–5.1)
Sodium: 139 mEq/L (ref 135–145)

## 2015-01-27 LAB — HEMOGLOBIN A1C: HEMOGLOBIN A1C: 6.5 % (ref 4.6–6.5)

## 2015-01-27 MED ORDER — OXYCODONE-ACETAMINOPHEN 5-325 MG PO TABS: 1.0000 | ORAL_TABLET | Freq: Four times a day (QID) | ORAL | Status: DC | PRN

## 2015-01-27 NOTE — Progress Notes (Signed)
Pre visit review using our clinic review tool, if applicable. No additional management support is needed unless otherwise documented below in the visit note. 

## 2015-01-27 NOTE — Assessment & Plan Note (Signed)
Stable gold stage 3 but non compliant Proventil

## 2015-01-27 NOTE — Assessment & Plan Note (Signed)
ASA, Plavix, Simvastatin 

## 2015-01-27 NOTE — Assessment & Plan Note (Signed)
Refused CPAP

## 2015-01-27 NOTE — Assessment & Plan Note (Signed)
2016 Dr Alvy Bimler On chemo

## 2015-01-27 NOTE — Assessment & Plan Note (Signed)
Better  

## 2015-01-27 NOTE — Assessment & Plan Note (Signed)
On Torsemide, Hydralazine Refusing CPAP Labs

## 2015-01-27 NOTE — Telephone Encounter (Signed)
Lab called and stated that WBC came back at 20.1.   Stacey and Dr. Mamie Nick. Both informed.

## 2015-01-27 NOTE — Assessment & Plan Note (Signed)
Torsemide Wt loss discussed

## 2015-01-27 NOTE — Assessment & Plan Note (Signed)
Improving.

## 2015-01-27 NOTE — Progress Notes (Signed)
Subjective:    HPI  Pt is here for a pos-hosp f/u:   Admit Date: 01/12/2015 Discharge date: 01/19/2015  Recommendations for Outpatient Follow-up:  1. Repeat cbc/bmet at next visit 2. Prednisone 4 week taper.  PRIMARY DISCHARGE DIAGNOSIS: Active Problems:  Cellulitis  Cellulitis of right lower extremity  Cellulitis of left lower extremity  Erythroderma  Purpura  RMSF [facility] spotted fever)  Adverse drug reaction  Follicular lymphoma  Congestive heart disease  Chronic diastolic CHF (congestive heart failure)  Thrombocytopenia  Coronary artery disease involving coronary bypass graft of native heart with unstable angina pectoris  Acute on chronic diastolic congestive heart failure  Vasculitis  Benign essential HTN  Leukocytoclastic vasculitis    PAST MEDICAL HISTORY: Past Medical History  Diagnosis Date  . Heparin induced thrombocytopenia   . Hypertension   . Hyperlipidemia   . Obesity   . Edema   . COPD (chronic obstructive pulmonary disease)   . GERD (gastroesophageal reflux disease)   . Basal cell carcinoma of nose   . Chronic lower back pain   . Depression   . Myocardial infarction   . Heart murmur   . Dysrhythmia     fluttering  . Anginal pain     not had to use in awhile none in 10 years  . CHF (congestive heart failure)   . Shortness of breath dyspnea     with exertion   . Diabetes mellitus without complication     borderline   . Anxiety   . Blood dyscrasia     trouble clotting   . Coronary artery disease     CABG 2005. s/p PTCA and stenting of the saphenous vein graft to PDA and saphenous vein graft to obtuse marginal by Dr. Burt Knack 09/29/11. Normal EF at cath 09/2011  . Sleep apnea     "used to" could not use 2006  . Carotid artery disease     s/p L CEA 2009 (hx of evacuation of hematoma due to heparin)  . Lymphoma  12/21/2014    DISCHARGE MEDICATIONS: Current Discharge Medication List    START taking these medications   Details  hydrALAZINE (APRESOLINE) 50 MG tablet Take 1 tablet (50 mg total) by mouth every 8 (eight) hours. Qty: 90 tablet, Refills: 0    oxyCODONE-acetaminophen (PERCOCET/ROXICET) 5-325 MG per tablet Take 1 tablet by mouth every 6 (six) hours as needed for moderate pain. Qty: 30 tablet, Refills: 0    predniSONE (DELTASONE) 10 MG tablet Take 4 tablets (40 mg) daily for one week, then, Take 3 tablets (30 mg) daily for one week,then, Take 2 tablets (20 mg) daily for one week, then, Take 1 tablet (10 mg) daily for one week and then stop Qty: 70 tablet, Refills: 0    triamcinolone ointment (KENALOG) 0.1 % Apply topically 3 (three) times daily. Qty: 30 g, Refills: 0      CONTINUE these medications which have NOT CHANGED   Details  acyclovir (ZOVIRAX) 400 MG tablet Take 1 tablet (400 mg total) by mouth daily. Qty: 30 tablet, Refills: 3   Associated Diagnoses: Follicular lymphoma grade 3a    albuterol (PROVENTIL) (2.5 MG/3ML) 0.083% nebulizer solution Take 3 mLs (2.5 mg total) by nebulization every 2 (two) hours as needed for wheezing. Qty: 75 mL, Refills: 2    allopurinol (ZYLOPRIM) 300 MG tablet Take 1 tablet (300 mg total) by mouth daily. Qty: 30 tablet, Refills: 3   Associated Diagnoses: Follicular lymphoma grade 3a  aspirin EC 81 MG tablet Take 81 mg by mouth daily.    clonazePAM (KLONOPIN) 0.5 MG tablet TAKE 1 TABLET BY MOUTH TWICE A DAY AS NEEDED FOR ANXIETY or at hs for insomnia Qty: 180 tablet, Refills: 0    clopidogrel (PLAVIX) 75 MG tablet TAKE 1 TABLET BY MOUTH DAILY WITH BREAKFAST. Qty: 90 tablet, Refills: 3    lidocaine-prilocaine (EMLA) cream Apply to affected area once Qty: 30 g, Refills: 3   Associated Diagnoses: Follicular lymphoma grade 3a    metoprolol succinate (TOPROL-XL) 50 MG 24 hr tablet TAKE 2  TABLETS EVERY DAY Qty: 180 tablet, Refills: 3    ondansetron (ZOFRAN) 8 MG tablet Take 1 tablet (8 mg total) by mouth every 8 (eight) hours as needed. Qty: 30 tablet, Refills: 1   Associated Diagnoses: Follicular lymphoma grade 3a    potassium chloride SA (KLOR-CON M20) 20 MEQ tablet TAKE 1 TABLET BY MOUTH DAILY Qty: 90 tablet, Refills: 3    simvastatin (ZOCOR) 20 MG tablet TAKE 1 TABLET (20 MG TOTAL) BY MOUTH AT BEDTIME. Qty: 90 tablet, Refills: 3    torsemide (DEMADEX) 100 MG tablet Take 100 mg by mouth daily.    naproxen sodium (ANAPROX) 220 MG tablet Take 1 tablet (220 mg total) by mouth as needed. Qty: 60 tablet, Refills: 2    nitroGLYCERIN (NITROSTAT) 0.4 MG SL tablet Place 1 tablet (0.4 mg total) under the tongue every 5 (five) minutes as needed for chest pain (Up to 3 doses). Qty: 25 tablet, Refills: 4    prochlorperazine (COMPAZINE) 10 MG tablet Take 1 tablet (10 mg total) by mouth every 6 (six) hours as needed (Nausea or vomiting). Qty: 30 tablet, Refills: 1   Associated Diagnoses: Follicular lymphoma grade 3a    tiotropium (SPIRIVA) 18 MCG inhalation capsule Place 1 capsule (18 mcg total) into inhaler and inhale daily. Qty: 30 capsule, Refills: 2      STOP taking these medications     traMADol (ULTRAM) 50 MG tablet         ALLERGIES:  Allergies  Allergen Reactions  . Heparin Other (See Comments)    Opposite reaction Heparin induced thrombocytopenia  . Tape Rash    BRIEF HPI: See H&P, Labs, Consult and Test reports for all details in brief, patient is a 59 year old male with history of grade 3 follicular lymphoma status post first cycle of Rituximab and Bendamustine on 12/31/14 presented to the hospital on 5/31 with bilateral lower extremity swelling and erythema. Patient was initially thought to have cellulitis and started on empiric vancomycin and Zosyn. However, retrospectively thought to have probably  vasculitis/allergic reaction to his most recent chemotherapy  CONSULTATIONS:  cardiology, ID and hematology/oncology  PERTINENT RADIOLOGIC STUDIES:  Imaging Results    Dg Chest 2 View  01/17/2015 CLINICAL DATA: Shortness of breath today, BILATERAL lower extremity swelling after chemotherapy last Friday, history lymphoma, COPD, MI, CHF, diabetes, hypertension, GERD EXAM: CHEST 2 VIEW COMPARISON: 09/13/2011 FINDINGS: RIGHT jugular Port-A-Cath with tip projecting over SVC. Enlargement of cardiac silhouette post CABG. Pulmonary vascular congestion. Interstitial infiltrates bilaterally favor pulmonary edema and CHF. Scattered mid lung scarring and underlying emphysematous changes. No acute infiltrate, pleural effusion or pneumothorax. IMPRESSION: Probable CHF superimposed with COPD. Electronically Signed By: Lavonia Dana M.D. On: 01/17/2015 15:13   Nm Pet Image Initial (pi) Skull Base To Thigh  12/25/2014 CLINICAL DATA: Initial treatment strategy for lymphoma. EXAM: NUCLEAR MEDICINE PET SKULL BASE TO THIGH TECHNIQUE: 16.0 mCi F-18 FDG was injected intravenously. Full-ring PET  imaging was performed from the skull base to thigh after the radiotracer. CT data was obtained and used for attenuation correction and anatomic localization. FASTING BLOOD GLUCOSE: Value: 121 mg/dl COMPARISON: 06/24/2009 FINDINGS: NECK The superficial left supraclavicular lymph node measuring 4.4 by 4.6 cm on image 40 of series 4 has maximum standard uptake value 12.2, [place] 5. There is a small amount of gas within this lesion suggesting recent biopsy. Symmetric tonsillar activity. Small amount of activity superficial to the right submandibular gland is probably vascular. Probable sebaceous cyst along the right posterior lower neck, image 38 series 4, photopenic. CHEST Left upper prevascular lymph node measuring 1.5 cm in short axis on image 66 of series 4 has maximum standard uptake value 7.9,  [place] 4. A small right upper paratracheal lymph node measuring 0.9 cm in short axis on image 60 of series 4 has maximum standard uptake value 4.3, [place] 3. Calcified pleural plaques noted. Background vascular mediastinal activity approximately 3.6. Other chest findings include coronary artery atherosclerosis, cardiomegaly, calcified pleural plaques (left greater than right), and bilateral airway thickening. Prior CABG. ABDOMEN/PELVIS Background hepatic activity 4.4. Portacaval lymph node measuring 1.6 cm in short axis on image 133 of series 4 has maximum standard uptake value 7.2, [place] 4. A a lymph node adjacent to the left adrenal gland measuring 1.9 cm in short axis on image 123 of series 4 has maximum standard uptake value 6.7, [place] 4. At the level of the left renal vessels, a left periaortic lymph node with maximum standard uptake value 7.1 measures 1.3 cm in short axis (image 136, series 4), [place] 4. Bilateral hypermetabolic inguinal lymph nodes are present. A right inguinal lymph node measuring 1.3 cm in short axis on image 218 of series 4 has maximum standard uptake value 4.2, [place] 3 Other abdominal findings include cholelithiasis, bilateral nonobstructive nephrolithiasis, and aortoiliac atherosclerosis. SKELETON Heterogeneous marrow activity noted but without definite focal bony lesion. Incidental note is made of right antecubital activity, maximum standard uptake value 8.6, probably injection related (this is the site of injection. ). Bridging spurring of both sacroiliac joints noted. IMPRESSION: 1. Scattered hypermetabolic adenopathy in the neck, chest, abdomen, and inguinal regions, compatible with active lymphoma. 2. Airway thickening is present, suggesting bronchitis or reactive airways disease. 3. Scattered heterogeneous marrow activity without a focal lesion, uncertain significance. 4. Other findings include calcified pleural plaques left greater than right ; prior  CABG; cardiomegaly; cholelithiasis; and bilateral nonobstructive nephrolithiasis. Electronically Signed By: Van Clines M.D. On: 12/25/2014 09:09   Ir Fluoro Guide Cv Line Right  12/28/2014 CLINICAL DATA: Lymphoma and need for porta cath to begin chemotherapy. EXAM: IMPLANTED PORT A CATH PLACEMENT WITH ULTRASOUND AND FLUOROSCOPIC GUIDANCE ANESTHESIA/SEDATION: 4.5 Mg IV Versed; 100 mcg IV Fentanyl Total Moderate Sedation Time: 36 minutes. Additional Medications: 3 g IV Ancef. As antibiotic prophylaxis, Ancef was ordered pre-procedure and administered intravenously within one hour of incision. FLUOROSCOPY TIME: 18 seconds. PROCEDURE: The procedure, risks, benefits, and alternatives were explained to the patient. Questions regarding the procedure were encouraged and answered. The patient understands and consents to the procedure. A time-out was performed prior to the procedure. Ultrasound was used to confirm patency of the right internal jugular vein. The right neck and chest were prepped with chlorhexidine in a sterile fashion, and a sterile drape was applied covering the operative field. Maximum barrier sterile technique with sterile gowns and gloves were used for the procedure. Local anesthesia was provided with 1% lidocaine. After creating a small venotomy incision, a  21 gauge needle was advanced into the right internal jugular vein under direct, real-time ultrasound guidance. Ultrasound image documentation was performed. After securing guidewire access, an 8 Fr dilator was placed. A J-wire was kinked to measure appropriate catheter length. A subcutaneous port pocket was then created along the upper chest wall utilizing sharp and blunt dissection. Portable cautery was utilized. The pocket was irrigated with sterile saline. A single lumen power injectable port was chosen for placement. The 8 Fr catheter was tunneled from the port pocket site to the venotomy incision. The port was  placed in the pocket. External catheter was trimmed to appropriate length based on guidewire measurement. At the venotomy, an 8 Fr peel-away sheath was placed over a guidewire. The catheter was then placed through the sheath and the sheath removed. Final catheter positioning was confirmed and documented with a fluoroscopic spot image. The port was accessed with a needle and aspirated and flushed with saline. The needle was removed. The venotomy and port pocket incisions were closed with subcutaneous 3-0 Monocryl and subcuticular 4-0 Vicryl. Dermabond was applied to both incisions. COMPLICATIONS: None FINDINGS: After catheter placement, the tip lies at the cavoatrial junction. The catheter aspirates normally and is ready for immediate use. IMPRESSION: Placement of single lumen port a cath via right internal jugular vein. The catheter tip lies at the cavoatrial junction. A power injectable port a cath was placed and is ready for immediate use. Electronically Signed By: Aletta Edouard M.D. On: 12/28/2014 15:02   Ir US Guide Vasc Access Right  12/28/2014 CLINICAL DATA: Lymphoma and need for porta cath to begin chemotherapy. EXAM: IMPLANTED PORT A CATH PLACEMENT WITH ULTRASOUND AND FLUOROSCOPIC GUIDANCE ANESTHESIA/SEDATION: 4.5 Mg IV Versed; 100 mcg IV Fentanyl Total Moderate Sedation Time: 36 minutes. Additional Medications: 3 g IV Ancef. As antibiotic prophylaxis, Ancef was ordered pre-procedure and administered intravenously within one hour of incision. FLUOROSCOPY TIME: 18 seconds. PROCEDURE: The procedure, risks, benefits, and alternatives were explained to the patient. Questions regarding the procedure were encouraged and answered. The patient understands and consents to the procedure. A time-out was performed prior to the procedure. Ultrasound was used to confirm patency of the right internal jugular vein. The right neck and chest were prepped with chlorhexidine in a sterile fashion, and  a sterile drape was applied covering the operative field. Maximum barrier sterile technique with sterile gowns and gloves were used for the procedure. Local anesthesia was provided with 1% lidocaine. After creating a small venotomy incision, a 21 gauge needle was advanced into the right internal jugular vein under direct, real-time ultrasound guidance. Ultrasound image documentation was performed. After securing guidewire access, an 8 Fr dilator was placed. A J-wire was kinked to measure appropriate catheter length. A subcutaneous port pocket was then created along the upper chest wall utilizing sharp and blunt dissection. Portable cautery was utilized. The pocket was irrigated with sterile saline. A single lumen power injectable port was chosen for placement. The 8 Fr catheter was tunneled from the port pocket site to the venotomy incision. The port was placed in the pocket. External catheter was trimmed to appropriate length based on guidewire measurement. At the venotomy, an 8 Fr peel-away sheath was placed over a guidewire. The catheter was then placed through the sheath and the sheath removed. Final catheter positioning was confirmed and documented with a fluoroscopic spot image. The port was accessed with a needle and aspirated and flushed with saline. The needle was removed. The venotomy and port pocket incisions were closed  with subcutaneous 3-0 Monocryl and subcuticular 4-0 Vicryl. Dermabond was applied to both incisions. COMPLICATIONS: None FINDINGS: After catheter placement, the tip lies at the cavoatrial junction. The catheter aspirates normally and is ready for immediate use. IMPRESSION: Placement of single lumen port a cath via right internal jugular vein. The catheter tip lies at the cavoatrial junction. A power injectable port a cath was placed and is ready for immediate use. Electronically Signed By: Aletta Edouard M.D. On: 12/28/2014 15:02   Dg Chest Port 1 View  01/18/2015  CLINICAL DATA: Congestive heart failure. EXAM: PORTABLE CHEST - 1 VIEW COMPARISON: 01/17/2015 . FINDINGS: Power port catheter in stable position. Prior CABG. Persistent cardiomegaly with diffuse pulmonary interstitial prominence. Small pleural effusions cannot be excluded. These findings again consistent with congestive heart failure. Pneumonitis cannot be excluded. Stable pleural parenchymal thickening noted bilaterally consistent with scarring. IMPRESSION: 1. Prior CABG. 2. Persistent changes of congestive heart failure with bilateral from interstitial edema. Persistent bilateral pleural parenchymal scarring. Electronically Signed By: Marcello Moores Register On: 01/18/2015 07:24      PERTINENT LAB RESULTS: CBC:  Recent Labs (last 2 labs)      Recent Labs  01/18/15 0320 01/19/15 0200  WBC 18.4* 15.3*  HGB 14.2 14.0  HCT 45.1 45.2  PLT 143* 175     CMET CMP  Labs (Brief)       Component Value Date/Time   NA 139 01/18/2015 0320   NA 140 12/21/2014 1414   K 4.8 01/18/2015 0320   K 4.1 12/21/2014 1414   CL 95* 01/18/2015 0320   CO2 38* 01/18/2015 0320   CO2 31* 12/21/2014 1414   GLUCOSE 159* 01/18/2015 0320   GLUCOSE 103 12/21/2014 1414   BUN 25* 01/18/2015 0320   BUN 15.0 12/21/2014 1414   CREATININE 0.84 01/18/2015 0320   CREATININE 0.9 12/21/2014 1414   CALCIUM 8.7* 01/18/2015 0320   CALCIUM 9.1 12/21/2014 1414   PROT 6.6 01/15/2015 0500   PROT 7.3 12/21/2014 1414   ALBUMIN 3.1* 01/15/2015 0500   ALBUMIN 3.9 12/21/2014 1414   AST 22 01/15/2015 0500   AST 21 12/21/2014 1414   ALT 27 01/15/2015 0500   ALT 28 12/21/2014 1414   ALKPHOS 57 01/15/2015 0500   ALKPHOS 83 12/21/2014 1414   BILITOT 0.6 01/15/2015 0500   BILITOT 0.67 12/21/2014 1414   GFRNONAA >60 01/18/2015 0320   GFRAA >60 01/18/2015 0320      GFR Estimated  Creatinine Clearance: 144.8 mL/min (by C-G formula based on Cr of 0.84).  Recent Labs (last 2 labs)     No results for input(s): LIPASE, AMYLASE in the last 72 hours.    Recent Labs (last 2 labs)      Recent Labs  01/17/15 2130 01/18/15 0320 01/18/15 0905  TROPONINI <0.03 <0.03 <0.03      Recent Labs (last 2 labs)     Invalid input(s): POCBNP    Recent Labs (last 2 labs)     No results for input(s): DDIMER in the last 72 hours.    Recent Labs (last 2 labs)     No results for input(s): HGBA1C in the last 72 hours.    Recent Labs (last 2 labs)     No results for input(s): CHOL, HDL, LDLCALC, TRIG, CHOLHDL, LDLDIRECT in the last 72 hours.    Recent Labs (last 2 labs)     No results for input(s): TSH, T4TOTAL, T3FREE, THYROIDAB in the last 72 hours.  Invalid input(s): FREET3  Recent Labs (last 2 labs)     No results for input(s): VITAMINB12, FOLATE, FERRITIN, TIBC, IRON, RETICCTPCT in the last 72 hours.   Coags:  Recent Labs (last 2 labs)     No results for input(s): INR in the last 72 hours.  Invalid input(s): PT   Microbiology: Recent Results (from the past 240 hour(s))  Culture, blood (routine x 2) Status: None   Collection Time: 01/12/15 4:25 PM  Result Value Ref Range Status   Specimen Description BLOOD RIGHT ASSIST CONTROL  Final   Special Requests BOTTLES DRAWN AEROBIC AND ANAEROBIC BML  Final   Culture   Final    NO GROWTH 5 DAYS Performed at [facility]    Report Status 01/18/2015 FINAL  Final  Culture, blood (routine x 2) Status: None   Collection Time: 01/12/15 4:32 PM  Result Value Ref Range Status   Specimen Description BLOOD LEFT ANTECUBITAL  Final   Special Requests BOTTLES DRAWN AEROBIC AND ANAEROBIC 5 CC EA  Final   Culture   Final    NO GROWTH 5 DAYS Performed at [facility]    Report Status 01/18/2015 FINAL  Final  Fungus culture, blood  Status: None (Preliminary result)   Collection Time: 01/14/15 11:20 AM  Result Value Ref Range Status   Specimen Description BLOOD RIGHT ARM  Final   Special Requests BOTTLES DRAWN AEROBIC ONLY 10CC  Final   Culture   Final    NO FUNGUS ISOLATED;CULTURE IN PROGRESS FOR 7 DAYS Note: Culture results may be compromised due to an excessive volume of blood received in culture bottles. Performed at [facility]    Report Status PENDING  Incomplete  Culture, blood (routine x 2) Status: None (Preliminary result)   Collection Time: 01/14/15 11:20 AM  Result Value Ref Range Status   Specimen Description BLOOD RIGHT ARM  Final   Special Requests BOTTLES DRAWN AEROBIC AND ANAEROBIC 10CC  Final   Culture   Final     BLOOD CULTURE RECEIVED NO GROWTH TO DATE CULTURE WILL BE HELD FOR 5 DAYS BEFORE ISSUING A FINAL NEGATIVE REPORT Performed at [facility]    Report Status PENDING  Incomplete  Culture, blood (routine x 2) Status: None (Preliminary result)   Collection Time: 01/14/15 11:29 AM  Result Value Ref Range Status   Specimen Description BLOOD RIGHT HAND  Final   Special Requests BOTTLES DRAWN AEROBIC ONLY 5CC  Final   Culture   Final     BLOOD CULTURE RECEIVED NO GROWTH TO DATE CULTURE WILL BE HELD FOR 5 DAYS BEFORE ISSUING A FINAL NEGATIVE REPORT Performed at [facility]    Report Status PENDING  Incomplete     BRIEF HOSPITAL COURSE:  Brief Narrative 59 year old male with history of grade 3 follicular lymphoma status post first cycle of Rituximab and Bendamustine on 12/31/14 presented to the hospital on 5/31 with bilateral lower extremity swelling and erythema. Patient was initially thought to have cellulitis and started on empiric vancomycin and Zosyn. However, retrospectively thought to have probably vasculitis/allergic reaction to his most recent  chemotherapy. 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 Started on empiric IV steroids with significant improvement. Unfortunately hospital course complicated by development of acute diastolic heart failure. Oncology, infectious disease and cardiology were consulted during this hospital course  Hospital course by Problem List: Bilateral lower extremity Rash: Doubt that this is cellulitis, suspect that this is more of a vasculitis/allergic reaction to his most recent  chemotherapy. Empirically started on Solu-Medrol, antibiotics narrowed down to Ancef. Rash now improved-Some erythema has started to clear-diddevelop a few blisters in the right lower extremity that have opened into superficial ulcerations. Punch biopsy done on 6/2 confirms leukocytoclastic vasculitis, taper Solu-Medrol down to 40 mg twice a day by discharged, spoke with patient's Dermatologist Dr Jarome Matin over the phone who recommended a 4 week taper.Have asked patient to follow up with Dr Ronnald Ramp in a week or so.  Acute diastolic heart failure: Suspect decompensation secondary to excessive fluid intake/steroids. Managed with IV Lasix. Cardiology consulted, has diuresed well, cardiology recommends patient be placed back on Demadex 100 mg daily on discharge. Compensated at the time of discharge.  History of lymphoma: Recent chemotherapy with Rituximab and Bendamustine. Oncology following- who plans to discuss case at tumor board-before offering any more chemotherapy especially in light of biopsy showing leukocytoclastic vasculitis.  Thrombocytopenia: Likely secondary to recent chemotherapy.Improving upto 124K- check CBC periodically  Leukocytosis: Likely from steroids.Follow closely as outpatient.   History of CAD status post CABG in 2005 and PCI in 2013: Continue aspirin/Plavix, statin and metoprolol. Stable throughout this hospital stay  History of carotid artery disease-status post left carotid endarterectomy in  2009:continue aspirin/Plavix and statin.  History of heparin-induced thrombocytopenia: Avoid Lovenox/heparin  History of obstructive sleep apnea: Offer CPAP daily at bedtime-refuses  History of gout: Continue with allopurinol-currently stable.  HTN:BP better controlled -continue Metoprolol-add hydralazine  GERD: Continue PPI  TODAY-DAY OF DISCHARGE:  Subjective:   [name] today has no headache,no chest abdominal pain,no new weakness tingling or numbness, feels much better wants to go home today.   Objective:   Blood pressure 135/57, pulse 86, temperature 98.1 F (36.7 C), temperature source Oral, resp. rate 18, height 6' (1.829 m), weight 157.444 kg (347 lb 1.6 oz), SpO2 96 %.  Intake/Output Summary (Last 24 hours) at 01/19/15 1426 Last data filed at 01/19/15 1404  Gross per 24 hour  Intake  100 ml  Output  4550 ml  Net -4450 ml   Filed Weights   01/15/15 0950 01/18/15 0540 01/19/15 0435  Weight: 160.21 kg (353 lb 3.2 oz) 158.714 kg (349 lb 14.4 oz) 157.444 kg (347 lb 1.6 oz)    Exam Awake Alert, Oriented *3, No new F.N deficits, Normal affect [place].AT,PERRAL Supple Neck,No JVD, No cervical lymphadenopathy appriciated.  Symmetrical Chest wall movement, Good air movement bilaterally, CTAB RRR,No Gallops,Rubs or new Murmurs, No Parasternal Heave +ve B.Sounds, Abd Soft, Non tender, No organomegaly appriciated, No rebound -guarding or rigidity. No Cyanosis, Clubbing or edema, No new Rash or bruise Rash in b/l lower extremities has started to clear up-much improved  DISCHARGE CONDITION: Stable  DISPOSITION: Home  DISCHARGE INSTRUCTIONS:  Activity:  As tolerated   Diet recommendation: Diabetic Diet        F/u OSA - he hasn't use CPAP since 2006 - he couldn't tolerate CPAP, he failed DOT exam at [facility]  F/u leg sweling -- L>R x 6 mo; better. 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 He was evaluated recently for chest discomfort and a Myoview scan demonstrated inferolateral infarct and ischemia. Cardiac catheterization was arranged. LHC 09/29/11: Distal left main 30%, ostial LAD 90%, LIMA-LAD patent, proximal  RI occluded, ostial SVG-RI 95%, proximal RCA occluded, proximal SVG-PDA 95%, EF 55%. The patient underwent PCI with Dr. Burt Knack on the same date: Promus DES to the SVG-RI and Promus DES to the SVG-PDA. Labs: Hemoglobin 15.9, potassium 4.7, creatinine 0.4.    Wt Readings from Last 3 Encounters:  01/27/15 335 lb (151.955 kg)  01/22/15 334 lb (151.501 kg)  01/19/15 347 lb 1.6 oz (157.444 kg)   BP Readings from Last 3 Encounters:  01/22/15 120/62  01/19/15 135/57  01/12/15 100/68      Review of Systems  Constitutional: Positive for unexpected weight change. Negative for appetite change and fatigue.  HENT: Negative for congestion, nosebleeds, sinus pressure, sneezing, sore throat and trouble swallowing.   Eyes: Negative for itching and visual disturbance.  Respiratory: Negative for apnea, cough and wheezing.   Cardiovascular: Positive for leg swelling. Negative for chest pain and palpitations.  Gastrointestinal: Negative for nausea, vomiting, diarrhea, blood in stool and abdominal distention.  Genitourinary: Negative for frequency, hematuria and decreased urine volume.  Musculoskeletal: Negative for back pain, joint swelling, gait problem and neck pain.  Skin: Negative for rash and wound.  Neurological: Negative for dizziness, tremors, speech difficulty and weakness.  Psychiatric/Behavioral: Negative for suicidal ideas, sleep disturbance, dysphoric mood and agitation. The patient is not nervous/anxious.         Objective:   Physical Exam  Constitutional: He is oriented to person, place, and time. He appears well-developed. No distress.  NAD Obese  HENT:  Mouth/Throat: Oropharynx is clear and moist.  Eyes: Conjunctivae are normal. Pupils are equal, round, and reactive to light.  Neck: Normal range of motion. No JVD present. No thyromegaly present.  Cardiovascular: Normal rate, regular rhythm, normal heart sounds and intact distal pulses.  Exam reveals no gallop and no friction rub.   No murmur heard. Pulmonary/Chest: Effort normal. No respiratory distress. He has no wheezes. He has no rales. He exhibits no tenderness.  Abdominal: Soft. Bowel sounds are normal. He exhibits no distension and no mass. There is no tenderness. There is no rebound and no guarding.  Musculoskeletal: Normal range of motion. He exhibits edema. He exhibits no tenderness.  Trace to 1+ B  Lymphadenopathy:    He has no cervical adenopathy.  Neurological: He is alert and oriented to person, place, and time. He has normal reflexes. No cranial nerve deficit. He exhibits normal muscle tone. He displays a negative Romberg sign. Coordination and gait normal.  Skin: Skin is warm and dry. No rash noted. No erythema.  Hyperpigmented, less swollen ankles; no ulcers   Psychiatric: He has a normal mood and affect. His behavior is normal. Judgment and thought content normal.  R upper back ulcer Healing erosions R ankle L supraclav LN is swollen  Lab Results  Component Value Date   WBC 15.3* 01/19/2015   HGB 14.0 01/19/2015   HCT 45.2 01/19/2015   PLT 175 01/19/2015   GLUCOSE 159* 01/18/2015   CHOL 131 03/25/2014   TRIG 185.0* 03/25/2014   HDL 24.00* 03/25/2014   LDLCALC 70 03/25/2014   ALT 27 01/15/2015   AST 22 01/15/2015   NA 139 01/18/2015   K 4.8 01/18/2015   CL 95* 01/18/2015   CREATININE 0.84 01/18/2015   BUN 25* 01/18/2015   CO2 38* 01/18/2015   TSH 2.01 02/25/2013   PSA 0.23 09/15/2011   INR 1.00 12/28/2014    HGBA1C 6.0 03/25/2014     Chart reviewed      Assessment & Plan:

## 2015-01-28 ENCOUNTER — Telehealth: Payer: Self-pay | Admitting: Hematology and Oncology

## 2015-01-28 ENCOUNTER — Ambulatory Visit (HOSPITAL_BASED_OUTPATIENT_CLINIC_OR_DEPARTMENT_OTHER): Payer: BLUE CROSS/BLUE SHIELD | Admitting: Hematology and Oncology

## 2015-01-28 ENCOUNTER — Ambulatory Visit: Payer: BLUE CROSS/BLUE SHIELD

## 2015-01-28 ENCOUNTER — Other Ambulatory Visit (HOSPITAL_BASED_OUTPATIENT_CLINIC_OR_DEPARTMENT_OTHER): Payer: BLUE CROSS/BLUE SHIELD

## 2015-01-28 VITALS — BP 132/51 | HR 67 | Temp 98.1°F | Resp 19 | Ht 72.0 in | Wt 339.9 lb

## 2015-01-28 DIAGNOSIS — C8230 Follicular lymphoma grade IIIa, unspecified site: Secondary | ICD-10-CM

## 2015-01-28 DIAGNOSIS — L03115 Cellulitis of right lower limb: Secondary | ICD-10-CM

## 2015-01-28 DIAGNOSIS — C8220 Follicular lymphoma grade III, unspecified, unspecified site: Secondary | ICD-10-CM

## 2015-01-28 DIAGNOSIS — Z95828 Presence of other vascular implants and grafts: Secondary | ICD-10-CM

## 2015-01-28 LAB — CBC WITH DIFFERENTIAL/PLATELET
BASO%: 0.8 % (ref 0.0–2.0)
Basophils Absolute: 0.1 10*3/uL (ref 0.0–0.1)
EOS%: 1 % (ref 0.0–7.0)
Eosinophils Absolute: 0.1 10*3/uL (ref 0.0–0.5)
HCT: 42.2 % (ref 38.4–49.9)
HEMOGLOBIN: 13.8 g/dL (ref 13.0–17.1)
LYMPH%: 7.7 % — ABNORMAL LOW (ref 14.0–49.0)
MCH: 31.8 pg (ref 27.2–33.4)
MCHC: 32.6 g/dL (ref 32.0–36.0)
MCV: 97.4 fL (ref 79.3–98.0)
MONO#: 1.1 10*3/uL — ABNORMAL HIGH (ref 0.1–0.9)
MONO%: 8.8 % (ref 0.0–14.0)
NEUT#: 10.1 10*3/uL — ABNORMAL HIGH (ref 1.5–6.5)
NEUT%: 81.7 % — ABNORMAL HIGH (ref 39.0–75.0)
PLATELETS: 157 10*3/uL (ref 140–400)
RBC: 4.33 10*6/uL (ref 4.20–5.82)
RDW: 14.3 % (ref 11.0–14.6)
WBC: 12.4 10*3/uL — ABNORMAL HIGH (ref 4.0–10.3)
lymph#: 1 10*3/uL (ref 0.9–3.3)

## 2015-01-28 LAB — COMPREHENSIVE METABOLIC PANEL (CC13)
ALT: 33 U/L (ref 0–55)
AST: 16 U/L (ref 5–34)
Albumin: 3.1 g/dL — ABNORMAL LOW (ref 3.5–5.0)
Alkaline Phosphatase: 76 U/L (ref 40–150)
Anion Gap: 7 mEq/L (ref 3–11)
BUN: 20.5 mg/dL (ref 7.0–26.0)
CO2: 34 meq/L — AB (ref 22–29)
Calcium: 8.7 mg/dL (ref 8.4–10.4)
Chloride: 102 mEq/L (ref 98–109)
Creatinine: 0.8 mg/dL (ref 0.7–1.3)
Glucose: 177 mg/dl — ABNORMAL HIGH (ref 70–140)
Potassium: 4 mEq/L (ref 3.5–5.1)
Sodium: 142 mEq/L (ref 136–145)
Total Bilirubin: 0.49 mg/dL (ref 0.20–1.20)
Total Protein: 6.1 g/dL — ABNORMAL LOW (ref 6.4–8.3)

## 2015-01-28 MED ORDER — ANTICOAGULANT SODIUM CITRATE 4% (200MG/5ML) IV SOLN
5.0000 mL | Freq: Once | Status: AC
  Administered 2015-01-28: 5 mL via INTRAVENOUS
  Filled 2015-01-28: qty 5

## 2015-01-28 MED ORDER — SODIUM CHLORIDE 0.9 % IJ SOLN
10.0000 mL | INTRAMUSCULAR | Status: DC | PRN
  Administered 2015-01-28: 10 mL via INTRAVENOUS
  Filled 2015-01-28: qty 10

## 2015-01-28 NOTE — Patient Instructions (Signed)

## 2015-01-28 NOTE — Assessment & Plan Note (Signed)
The skin cellulitis improving and the rash are receding. He will continue prednisone course as instructed. He is doing a good job taking care of his lower extremity wound. I recommend holding treatment one more week for now due to significant risk of pancytopenia and poor wound healing.

## 2015-01-28 NOTE — Progress Notes (Signed)
[facility] OFFICE PROGRESS NOTE  Patient Care Team: [name], MD as PCP - General (Internal Medicine) [name], MD as Consulting Physician (Cardiology) Heath Lark, MD as Consulting Physician (Hematology and Oncology) Jerrell Belfast, MD as Consulting Physician (Otolaryngology)  SUMMARY OF ONCOLOGIC HISTORY: Oncology History   FLIPI score of 2; stage 3 and areas of involvement >4     Follicular lymphoma grade 3a   06/24/2009 Imaging PET CT scan showed two small FDG positive left neck nodes   12/11/2014 Surgery He underwent excisional lymph node biopsy of the left supraclavicular lymph node/neck region   12/11/2014 Pathology Results Accession: [id number] biopsy show follicular lymphoma   0/93/2671 Imaging ECHO showed LVH but preserved EF   12/25/2014 Imaging PET scan showed disease above and below diaphragm   12/28/2014 Procedure He has port placement   12/31/2014 - 01/01/2015 Chemotherapy He received 1 cycle of bendamustine with rituximab, discontinued due to suspicion of allergic reaction to bendamustine   01/12/2015 - 01/19/2015 Hospital Admission He was admitted to the hospital with suspicious allergic reaction, significant bilateral lower extremity edema with cellulitis, pneumonia and mild fluid overload    INTERVAL HISTORY: Please see below for problem oriented charting. He returns for further follow-up. He is taking good care of the wound. The skin char/necrotic skin has sloughed off and he is developing clean scab. Denies new skin rash. Denies new lymphadenopathy. Denies any cough or shortness of breath. REVIEW OF SYSTEMS:   Constitutional: Denies fevers, chills or abnormal weight loss Eyes: Denies blurriness of vision Ears, nose, mouth, throat, and face: Denies mucositis or sore throat Respiratory: Denies cough, dyspnea or wheezes Cardiovascular: Denies palpitation, chest discomfort or lower extremity swelling Gastrointestinal:  Denies nausea,  heartburn or change in bowel habits Lymphatics: Denies new lymphadenopathy or easy bruising Neurological:Denies numbness, tingling or new weaknesses Behavioral/Psych: Mood is stable, no new changes  All other systems were reviewed with the patient and are negative.  I have reviewed the past medical history, past surgical history, social history and family history with the patient and they are unchanged from previous note.  ALLERGIES:  is allergic to bendamustine hcl; heparin; and tape.  MEDICATIONS:  Current Outpatient Prescriptions  Medication Sig Dispense Refill  . albuterol (PROVENTIL) (2.5 MG/3ML) 0.083% nebulizer solution Take 3 mLs (2.5 mg total) by nebulization every 2 (two) hours as needed for wheezing. 75 mL 2  . ANORO ELLIPTA 62.5-25 MCG/INH AEPB as needed.    Marland Kitchen aspirin EC 81 MG tablet Take 81 mg by mouth daily.    . clonazePAM (KLONOPIN) 0.5 MG tablet TAKE 1 TABLET BY MOUTH TWICE A DAY AS NEEDED FOR ANXIETY or at hs for insomnia 180 tablet 0  . clopidogrel (PLAVIX) 75 MG tablet TAKE 1 TABLET BY MOUTH DAILY WITH BREAKFAST. 90 tablet 3  . hydrALAZINE (APRESOLINE) 50 MG tablet Take 1 tablet (50 mg total) by mouth every 8 (eight) hours. 90 tablet 0  . metoprolol succinate (TOPROL-XL) 50 MG 24 hr tablet TAKE 2 TABLETS EVERY DAY 180 tablet 3  . nitroGLYCERIN (NITROSTAT) 0.4 MG SL tablet Place 1 tablet (0.4 mg total) under the tongue every 5 (five) minutes as needed for chest pain (Up to 3 doses). 25 tablet 4  . oxyCODONE-acetaminophen (PERCOCET/ROXICET) 5-325 MG per tablet Take 1 tablet by mouth every 6 (six) hours as needed for moderate pain. 120 tablet 0  . potassium chloride SA (KLOR-CON M20) 20 MEQ tablet TAKE 1 TABLET BY MOUTH DAILY 90 tablet  3  . predniSONE (DELTASONE) 10 MG tablet Take 4 tablets (40 mg) daily for one week, then, Take 3 tablets (30 mg) daily for one week,then, Take 2 tablets (20 mg) daily for one week, then, Take 1 tablet   (10 mg) daily for one week and then  stop 70 tablet 0  . simvastatin (ZOCOR) 20 MG tablet TAKE 1 TABLET (20 MG TOTAL) BY MOUTH AT BEDTIME. 90 tablet 3  . torsemide (DEMADEX) 100 MG tablet Take 100 mg by mouth daily.    Marland Kitchen tretinoin (RETIN-A) 0.025 % cream Apply 1 application topically as needed.     . triamcinolone ointment (KENALOG) 0.1 % Apply topically 3 (three) times daily. 30 g 0   No current facility-administered medications for this visit.    PHYSICAL EXAMINATION: ECOG PERFORMANCE STATUS: 1 - Symptomatic but completely ambulatory  Filed Vitals:   01/28/15 0817  BP: 132/51  Pulse: 67  Temp: 98.1 F (36.7 C)  Resp: 19   Filed Weights   01/28/15 0817  Weight: 339 lb 14.4 oz (154.178 kg)    GENERAL:alert, no distress and comfortable. He is morbidly obese SKIN: Skin rash has resolved. He had skin ulceration which appears to be healing EYES: normal, Conjunctiva are pink and non-injected, sclera clear HEART: He has persistent bilateral lower extremity edema NEURO: alert & oriented x 3 with fluent speech, no focal motor/sensory deficits  LABORATORY DATA:  I have reviewed the data as listed    Component Value Date/Time   NA 142 01/28/2015 0754   NA 139 01/27/2015 1524   K 4.0 01/28/2015 0754   K 3.7 01/27/2015 1524   CL 99 01/27/2015 1524   CO2 34* 01/28/2015 0754   CO2 36* 01/27/2015 1524   GLUCOSE 177* 01/28/2015 0754   GLUCOSE 99 01/27/2015 1524   BUN 20.5 01/28/2015 0754   BUN 25* 01/27/2015 1524   CREATININE 0.8 01/28/2015 0754   CREATININE 1.05 01/27/2015 1524   CALCIUM 8.7 01/28/2015 0754   CALCIUM 9.2 01/27/2015 1524   PROT 6.1* 01/28/2015 0754   PROT 6.6 01/15/2015 0500   ALBUMIN 3.1* 01/28/2015 0754   ALBUMIN 3.1* 01/15/2015 0500   AST 16 01/28/2015 0754   AST 22 01/15/2015 0500   ALT 33 01/28/2015 0754   ALT 27 01/15/2015 0500   ALKPHOS 76 01/28/2015 0754   ALKPHOS 57 01/15/2015 0500   BILITOT 0.49 01/28/2015 0754   BILITOT 0.6 01/15/2015 0500   GFRNONAA >60 01/18/2015 0320   GFRAA  >60 01/18/2015 0320    No results found for: SPEP, UPEP  Lab Results  Component Value Date   WBC 12.4* 01/28/2015   NEUTROABS 10.1* 01/28/2015   HGB 13.8 01/28/2015   HCT 42.2 01/28/2015   MCV 97.4 01/28/2015   PLT 157 01/28/2015      Chemistry      Component Value Date/Time   NA 142 01/28/2015 0754   NA 139 01/27/2015 1524   K 4.0 01/28/2015 0754   K 3.7 01/27/2015 1524   CL 99 01/27/2015 1524   CO2 34* 01/28/2015 0754   CO2 36* 01/27/2015 1524   BUN 20.5 01/28/2015 0754   BUN 25* 01/27/2015 1524   CREATININE 0.8 01/28/2015 0754   CREATININE 1.05 01/27/2015 1524      Component Value Date/Time   CALCIUM 8.7 01/28/2015 0754   CALCIUM 9.2 01/27/2015 1524   ALKPHOS 76 01/28/2015 0754   ALKPHOS 57 01/15/2015 0500   AST 16 01/28/2015 0754   AST 22 01/15/2015  0500   ALT 33 01/28/2015 0754   ALT 27 01/15/2015 0500   BILITOT 0.49 01/28/2015 0754   BILITOT 0.6 01/15/2015 0500     ASSESSMENT & PLAN:  Follicular lymphoma grade 3a Overall, he had 2 types of reactions to cycle one of chemotherapy. He developed reaction to rituximab which is a, acute reaction with first dose of treatment. This is considered a hypersensitivity but not true allergic reaction. I do not think we need to give up using rituximab yet. In terms of the other reaction, he had significant skin rash throughout after bendamustine which could be a true form of allergic reaction causing rash and cellulitis/vasculitis in his lower extremities. I plan to discontinue bendamustine permanently and entered it as a drug/medication allergy. I reviewed with him the current guidelines and discussed various treatment options with him. He is in agreement ultimately to proceed with rituximab, Cytoxan, Vincristine and prednisone. He is currently on prednisone ctaper course and he will continue to taper course as instructed. I plan to see him back again next week to make sure that the skin ulceration in his lower extremities  has healed before we start him on cycle 1 of treatment.  Cellulitis of right lower extremity The skin cellulitis improving and the rash are receding. He will continue prednisone course as instructed. He is doing a good job taking care of his lower extremity wound. I recommend holding treatment one more week for now due to significant risk of pancytopenia and poor wound healing.   No orders of the defined types were placed in this encounter.   All questions were answered. The patient knows to call the clinic with any problems, questions or concerns. No barriers to learning was detected. I spent 25 minutes counseling the patient face to face. The total time spent in the appointment was 30 minutes and more than 50% was on counseling and review of test results     [facility], [place], MD 01/28/2015 12:59 PM

## 2015-01-28 NOTE — Assessment & Plan Note (Signed)
Overall, he had 2 types of reactions to cycle one of chemotherapy. He developed reaction to rituximab which is a, acute reaction with first dose of treatment. This is considered a hypersensitivity but not true allergic reaction. I do not think we need to give up using rituximab yet. In terms of the other reaction, he had significant skin rash throughout after bendamustine which could be a true form of allergic reaction causing rash and cellulitis/vasculitis in his lower extremities. I plan to discontinue bendamustine permanently and entered it as a drug/medication allergy. I reviewed with him the current guidelines and discussed various treatment options with him. He is in agreement ultimately to proceed with rituximab, Cytoxan, Vincristine and prednisone. He is currently on prednisone ctaper course and he will continue to taper course as instructed. I plan to see him back again next week to make sure that the skin ulceration in his lower extremities has healed before we start him on cycle 1 of treatment.

## 2015-01-28 NOTE — Telephone Encounter (Signed)
Gave and printed appt sched and avs to pt

## 2015-01-29 ENCOUNTER — Ambulatory Visit: Payer: BLUE CROSS/BLUE SHIELD

## 2015-01-29 ENCOUNTER — Other Ambulatory Visit: Payer: Self-pay | Admitting: Internal Medicine

## 2015-02-03 ENCOUNTER — Encounter: Payer: Self-pay | Admitting: Hematology and Oncology

## 2015-02-03 ENCOUNTER — Telehealth: Payer: Self-pay | Admitting: Hematology and Oncology

## 2015-02-03 ENCOUNTER — Ambulatory Visit (HOSPITAL_BASED_OUTPATIENT_CLINIC_OR_DEPARTMENT_OTHER): Payer: BLUE CROSS/BLUE SHIELD | Admitting: Hematology and Oncology

## 2015-02-03 ENCOUNTER — Telehealth: Payer: Self-pay | Admitting: *Deleted

## 2015-02-03 VITALS — BP 120/43 | HR 68 | Temp 98.4°F | Resp 18 | Ht 72.0 in | Wt 339.7 lb

## 2015-02-03 DIAGNOSIS — C8220 Follicular lymphoma grade III, unspecified, unspecified site: Secondary | ICD-10-CM

## 2015-02-03 DIAGNOSIS — L03115 Cellulitis of right lower limb: Secondary | ICD-10-CM | POA: Diagnosis not present

## 2015-02-03 DIAGNOSIS — C8230 Follicular lymphoma grade IIIa, unspecified site: Secondary | ICD-10-CM

## 2015-02-03 NOTE — Assessment & Plan Note (Signed)
Clinically, he has no signs of disease progression. Due to persistent nonhealing wound, I will delay chemotherapy by 2 weeks with plan to start his treatment on 02/18/2015. As mentioned from prior visit, I plan to switch his treatment to rituximab, Cytoxan, vincristine and prednisone.

## 2015-02-03 NOTE — Assessment & Plan Note (Signed)
His lower extremity wound in the left leg has improved. On the right leg, unfortunately, there is still significant ulceration noted. He will continue prednisone taper as directed by his dermatologist. I plan to delay chemotherapy until 02/18/2015.

## 2015-02-03 NOTE — Telephone Encounter (Signed)
per pof to sch pt appt-sent MW email to sch trmt-gave pt avs-pt aware of inf

## 2015-02-03 NOTE — Telephone Encounter (Signed)
Per staff message and POF I have scheduled appts. Advised scheduler of appts. JMW  

## 2015-02-03 NOTE — Progress Notes (Signed)
[place] OFFICE PROGRESS NOTE  Patient Care Team: [name], MD as PCP - General (Internal Medicine) [name], MD as Consulting Physician (Cardiology) Heath Lark, MD as Consulting Physician (Hematology and Oncology) Jerrell Belfast, MD as Consulting Physician (Otolaryngology)  SUMMARY OF ONCOLOGIC HISTORY: Oncology History   FLIPI score of 2; stage 3 and areas of involvement >4     Follicular lymphoma grade 3a   06/24/2009 Imaging PET CT scan showed two small FDG positive left neck nodes   12/11/2014 Surgery He underwent excisional lymph node biopsy of the left supraclavicular lymph node/neck region   12/11/2014 Pathology Results Accession: [id number] biopsy show follicular lymphoma   1/85/6314 Imaging ECHO showed LVH but preserved EF   12/25/2014 Imaging PET scan showed disease above and below diaphragm   12/28/2014 Procedure He has port placement   12/31/2014 - 01/01/2015 Chemotherapy He received 1 cycle of bendamustine with rituximab, discontinued due to suspicion of allergic reaction to bendamustine   01/12/2015 - 01/19/2015 Hospital Admission He was admitted to the hospital with suspicious allergic reaction, significant bilateral lower extremity edema with cellulitis, pneumonia and mild fluid overload    INTERVAL HISTORY: Please see below for problem oriented charting.  he is seen for further review. The cellulitis in the left lower extremity has improved. On the right leg, unfortunately, due to recent compression hose, and has caused significant weeping and persistent ulceration. He denies any chest pain or shortness of breath. No recent cough.  REVIEW OF SYSTEMS:   Constitutional: Denies fevers, chills or abnormal weight loss Eyes: Denies blurriness of vision Ears, nose, mouth, throat, and face: Denies mucositis or sore throat Respiratory: Denies cough, dyspnea or wheezes Cardiovascular: Denies palpitation, chest discomfort Gastrointestinal:   Denies nausea, heartburn or change in bowel habits Lymphatics: Denies new lymphadenopathy or easy bruising Neurological:Denies numbness, tingling or new weaknesses Behavioral/Psych: Mood is stable, no new changes  All other systems were reviewed with the patient and are negative.  I have reviewed the past medical history, past surgical history, social history and family history with the patient and they are unchanged from previous note.  ALLERGIES:  is allergic to bendamustine hcl; heparin; and tape.  MEDICATIONS:  Current Outpatient Prescriptions  Medication Sig Dispense Refill  . albuterol (PROVENTIL) (2.5 MG/3ML) 0.083% nebulizer solution Take 3 mLs (2.5 mg total) by nebulization every 2 (two) hours as needed for wheezing. 75 mL 2  . ANORO ELLIPTA 62.5-25 MCG/INH AEPB as needed.    Marland Kitchen aspirin EC 81 MG tablet Take 81 mg by mouth daily.    . clonazePAM (KLONOPIN) 0.5 MG tablet TAKE 1 TABLET BY MOUTH TWICE A DAY AS NEEDED FOR ANXIETY or at hs for insomnia 180 tablet 0  . clopidogrel (PLAVIX) 75 MG tablet TAKE 1 TABLET BY MOUTH DAILY WITH BREAKFAST. 90 tablet 3  . hydrALAZINE (APRESOLINE) 50 MG tablet Take 1 tablet (50 mg total) by mouth every 8 (eight) hours. 90 tablet 0  . metoprolol succinate (TOPROL-XL) 50 MG 24 hr tablet TAKE 2 TABLETS EVERY DAY 180 tablet 3  . nitroGLYCERIN (NITROSTAT) 0.4 MG SL tablet Place 1 tablet (0.4 mg total) under the tongue every 5 (five) minutes as needed for chest pain (Up to 3 doses). 25 tablet 4  . oxyCODONE-acetaminophen (PERCOCET/ROXICET) 5-325 MG per tablet Take 1 tablet by mouth every 6 (six) hours as needed for moderate pain. 120 tablet 0  . potassium chloride SA (KLOR-CON M20) 20 MEQ tablet TAKE 1 TABLET BY MOUTH  DAILY 90 tablet 3  . predniSONE (DELTASONE) 10 MG tablet Take 4 tablets (40 mg) daily for one week, then, Take 3 tablets (30 mg) daily for one week,then, Take 2 tablets (20 mg) daily for one week, then, Take 1 tablet   (10 mg) daily for one  week and then stop 70 tablet 0  . simvastatin (ZOCOR) 20 MG tablet TAKE 1 TABLET (20 MG TOTAL) BY MOUTH AT BEDTIME. 90 tablet 3  . torsemide (DEMADEX) 100 MG tablet Take 100 mg by mouth daily.    Marland Kitchen tretinoin (RETIN-A) 0.025 % cream Apply 1 application topically as needed.     . triamcinolone ointment (KENALOG) 0.1 % Apply topically 3 (three) times daily. 30 g 0   No current facility-administered medications for this visit.    PHYSICAL EXAMINATION: ECOG PERFORMANCE STATUS: 1 - Symptomatic but completely ambulatory  Filed Vitals:   02/03/15 1109  BP: 120/43  Pulse: 68  Temp: 98.4 F (36.9 C)  Resp: 18   Filed Weights   02/03/15 1109  Weight: 339 lb 11.2 oz (154.087 kg)    GENERAL:alert, no distress and comfortable SKIN: persistent skin ulceration on the right lower extremity, slightly worse than prior visit. HEART: he has moderate bilateral lower extremity edema Musculoskeletal:no cyanosis of digits and no clubbing  NEURO: alert & oriented x 3 with fluent speech, no focal motor/sensory deficits  LABORATORY DATA:  I have reviewed the data as listed    Component Value Date/Time   NA 142 01/28/2015 0754   NA 139 01/27/2015 1524   K 4.0 01/28/2015 0754   K 3.7 01/27/2015 1524   CL 99 01/27/2015 1524   CO2 34* 01/28/2015 0754   CO2 36* 01/27/2015 1524   GLUCOSE 177* 01/28/2015 0754   GLUCOSE 99 01/27/2015 1524   BUN 20.5 01/28/2015 0754   BUN 25* 01/27/2015 1524   CREATININE 0.8 01/28/2015 0754   CREATININE 1.05 01/27/2015 1524   CALCIUM 8.7 01/28/2015 0754   CALCIUM 9.2 01/27/2015 1524   PROT 6.1* 01/28/2015 0754   PROT 6.6 01/15/2015 0500   ALBUMIN 3.1* 01/28/2015 0754   ALBUMIN 3.1* 01/15/2015 0500   AST 16 01/28/2015 0754   AST 22 01/15/2015 0500   ALT 33 01/28/2015 0754   ALT 27 01/15/2015 0500   ALKPHOS 76 01/28/2015 0754   ALKPHOS 57 01/15/2015 0500   BILITOT 0.49 01/28/2015 0754   BILITOT 0.6 01/15/2015 0500   GFRNONAA >60 01/18/2015 0320   GFRAA >60  01/18/2015 0320    No results found for: SPEP, UPEP  Lab Results  Component Value Date   WBC 12.4* 01/28/2015   NEUTROABS 10.1* 01/28/2015   HGB 13.8 01/28/2015   HCT 42.2 01/28/2015   MCV 97.4 01/28/2015   PLT 157 01/28/2015      Chemistry      Component Value Date/Time   NA 142 01/28/2015 0754   NA 139 01/27/2015 1524   K 4.0 01/28/2015 0754   K 3.7 01/27/2015 1524   CL 99 01/27/2015 1524   CO2 34* 01/28/2015 0754   CO2 36* 01/27/2015 1524   BUN 20.5 01/28/2015 0754   BUN 25* 01/27/2015 1524   CREATININE 0.8 01/28/2015 0754   CREATININE 1.05 01/27/2015 1524      Component Value Date/Time   CALCIUM 8.7 01/28/2015 0754   CALCIUM 9.2 01/27/2015 1524   ALKPHOS 76 01/28/2015 0754   ALKPHOS 57 01/15/2015 0500   AST 16 01/28/2015 0754   AST 22 01/15/2015 0500  ALT 33 01/28/2015 0754   ALT 27 01/15/2015 0500   BILITOT 0.49 01/28/2015 0754   BILITOT 0.6 01/15/2015 0500      ASSESSMENT & PLAN:  Follicular lymphoma grade 3a Clinically, he has no signs of disease progression. Due to persistent nonhealing wound, I will delay chemotherapy by 2 weeks with plan to start his treatment on 02/18/2015. As mentioned from prior visit, I plan to switch his treatment to rituximab, Cytoxan, vincristine and prednisone.  Cellulitis of right lower extremity  His lower extremity wound in the left leg has improved. On the right leg, unfortunately, there is still significant ulceration noted. He will continue prednisone taper as directed by his dermatologist. I plan to delay chemotherapy until 02/18/2015.   No orders of the defined types were placed in this encounter.   All questions were answered. The patient knows to call the clinic with any problems, questions or concerns. No barriers to learning was detected. I spent 15 minutes counseling the patient face to face. The total time spent in the appointment was 20 minutes and more than 50% was on counseling and review of test  results     [facility], Gurinder Toral, MD 02/03/2015 11:30 AM

## 2015-02-04 ENCOUNTER — Ambulatory Visit: Payer: BLUE CROSS/BLUE SHIELD

## 2015-02-11 ENCOUNTER — Encounter: Payer: Self-pay | Admitting: Internal Medicine

## 2015-02-11 ENCOUNTER — Encounter (HOSPITAL_BASED_OUTPATIENT_CLINIC_OR_DEPARTMENT_OTHER): Payer: Self-pay | Admitting: *Deleted

## 2015-02-11 ENCOUNTER — Ambulatory Visit (HOSPITAL_BASED_OUTPATIENT_CLINIC_OR_DEPARTMENT_OTHER)
Admission: RE | Admit: 2015-02-11 | Discharge: 2015-02-11 | Disposition: A | Discharge status: Home / Self Care | Payer: BLUE CROSS/BLUE SHIELD | Source: Ambulatory Visit | Attending: Orthopedic Surgery | Admitting: Orthopedic Surgery

## 2015-02-11 ENCOUNTER — Encounter (HOSPITAL_BASED_OUTPATIENT_CLINIC_OR_DEPARTMENT_OTHER)
Admission: RE | Disposition: A | Discharge status: Home / Self Care | Payer: Self-pay | Source: Ambulatory Visit | Attending: Orthopedic Surgery

## 2015-02-11 ENCOUNTER — Ambulatory Visit (HOSPITAL_BASED_OUTPATIENT_CLINIC_OR_DEPARTMENT_OTHER): Payer: BLUE CROSS/BLUE SHIELD | Admitting: Certified Registered"

## 2015-02-11 ENCOUNTER — Ambulatory Visit (INDEPENDENT_AMBULATORY_CARE_PROVIDER_SITE_OTHER): Payer: BLUE CROSS/BLUE SHIELD | Admitting: Internal Medicine

## 2015-02-11 VITALS — BP 118/58 | HR 77 | Temp 98.3°F | Resp 16 | Ht 72.0 in | Wt 348.0 lb

## 2015-02-11 DIAGNOSIS — L02512 Cutaneous abscess of left hand: Secondary | ICD-10-CM | POA: Diagnosis present

## 2015-02-11 DIAGNOSIS — F329 Major depressive disorder, single episode, unspecified: Secondary | ICD-10-CM | POA: Diagnosis not present

## 2015-02-11 DIAGNOSIS — R7309 Other abnormal glucose: Secondary | ICD-10-CM | POA: Diagnosis not present

## 2015-02-11 DIAGNOSIS — E785 Hyperlipidemia, unspecified: Secondary | ICD-10-CM | POA: Insufficient documentation

## 2015-02-11 DIAGNOSIS — E669 Obesity, unspecified: Secondary | ICD-10-CM | POA: Insufficient documentation

## 2015-02-11 DIAGNOSIS — Z79899 Other long term (current) drug therapy: Secondary | ICD-10-CM | POA: Insufficient documentation

## 2015-02-11 DIAGNOSIS — L03114 Cellulitis of left upper limb: Secondary | ICD-10-CM | POA: Insufficient documentation

## 2015-02-11 DIAGNOSIS — K219 Gastro-esophageal reflux disease without esophagitis: Secondary | ICD-10-CM | POA: Diagnosis not present

## 2015-02-11 DIAGNOSIS — Z85828 Personal history of other malignant neoplasm of skin: Secondary | ICD-10-CM | POA: Diagnosis not present

## 2015-02-11 DIAGNOSIS — I10 Essential (primary) hypertension: Secondary | ICD-10-CM | POA: Diagnosis not present

## 2015-02-11 DIAGNOSIS — Z951 Presence of aortocoronary bypass graft: Secondary | ICD-10-CM | POA: Insufficient documentation

## 2015-02-11 DIAGNOSIS — I509 Heart failure, unspecified: Secondary | ICD-10-CM | POA: Diagnosis not present

## 2015-02-11 DIAGNOSIS — I252 Old myocardial infarction: Secondary | ICD-10-CM | POA: Diagnosis not present

## 2015-02-11 DIAGNOSIS — I2510 Atherosclerotic heart disease of native coronary artery without angina pectoris: Secondary | ICD-10-CM | POA: Insufficient documentation

## 2015-02-11 DIAGNOSIS — G4730 Sleep apnea, unspecified: Secondary | ICD-10-CM | POA: Diagnosis not present

## 2015-02-11 DIAGNOSIS — Z955 Presence of coronary angioplasty implant and graft: Secondary | ICD-10-CM | POA: Insufficient documentation

## 2015-02-11 DIAGNOSIS — Z6841 Body Mass Index (BMI) 40.0 and over, adult: Secondary | ICD-10-CM | POA: Diagnosis not present

## 2015-02-11 DIAGNOSIS — G8929 Other chronic pain: Secondary | ICD-10-CM | POA: Insufficient documentation

## 2015-02-11 DIAGNOSIS — J449 Chronic obstructive pulmonary disease, unspecified: Secondary | ICD-10-CM | POA: Diagnosis not present

## 2015-02-11 DIAGNOSIS — F419 Anxiety disorder, unspecified: Secondary | ICD-10-CM | POA: Diagnosis not present

## 2015-02-11 DIAGNOSIS — M545 Low back pain: Secondary | ICD-10-CM | POA: Diagnosis not present

## 2015-02-11 HISTORY — DX: Dependence on supplemental oxygen: Z99.81

## 2015-02-11 HISTORY — PX: I & D EXTREMITY: SHX5045

## 2015-02-11 SURGERY — IRRIGATION AND DEBRIDEMENT EXTREMITY
Anesthesia: Regional | Site: Finger | Laterality: Left

## 2015-02-11 MED ORDER — SULFAMETHOXAZOLE-TRIMETHOPRIM 800-160 MG PO TABS: 1.0000 | ORAL_TABLET | Freq: Two times a day (BID) | ORAL | Status: DC

## 2015-02-11 MED ORDER — CHLORHEXIDINE GLUCONATE 4 % EX LIQD: 60.0000 mL | Freq: Once | CUTANEOUS | Status: DC

## 2015-02-11 MED ORDER — HYDROMORPHONE HCL 1 MG/ML IJ SOLN: 0.2500 mg | INTRAMUSCULAR | Status: DC | PRN

## 2015-02-11 MED ORDER — OXYCODONE-ACETAMINOPHEN 5-325 MG PO TABS: ORAL_TABLET | ORAL | Status: DC

## 2015-02-11 MED ORDER — FENTANYL CITRATE (PF) 100 MCG/2ML IJ SOLN
INTRAMUSCULAR | Status: AC
  Filled 2015-02-11: qty 4

## 2015-02-11 MED ORDER — 0.9 % SODIUM CHLORIDE (POUR BTL) OPTIME
TOPICAL | Status: DC | PRN
  Administered 2015-02-11: 500 mL

## 2015-02-11 MED ORDER — MIDAZOLAM HCL 2 MG/2ML IJ SOLN
1.0000 mg | INTRAMUSCULAR | Status: DC | PRN
  Administered 2015-02-11 (×2): 1 mg via INTRAVENOUS

## 2015-02-11 MED ORDER — VANCOMYCIN HCL IN DEXTROSE 1-5 GM/200ML-% IV SOLN
1000.0000 mg | Freq: Once | INTRAVENOUS | Status: AC
  Administered 2015-02-11: 1000 mg via INTRAVENOUS

## 2015-02-11 MED ORDER — GLYCOPYRROLATE 0.2 MG/ML IJ SOLN: 0.2000 mg | Freq: Once | INTRAMUSCULAR | Status: DC | PRN

## 2015-02-11 MED ORDER — VANCOMYCIN HCL IN DEXTROSE 500-5 MG/100ML-% IV SOLN
INTRAVENOUS | Status: AC
  Filled 2015-02-11: qty 100

## 2015-02-11 MED ORDER — ROPIVACAINE HCL 5 MG/ML IJ SOLN
INTRAMUSCULAR | Status: DC | PRN
  Administered 2015-02-11 (×2): 30 mg via PERINEURAL

## 2015-02-11 MED ORDER — PROMETHAZINE HCL 25 MG/ML IJ SOLN: 6.2500 mg | INTRAMUSCULAR | Status: DC | PRN

## 2015-02-11 MED ORDER — FENTANYL CITRATE (PF) 100 MCG/2ML IJ SOLN
INTRAMUSCULAR | Status: AC
  Filled 2015-02-11: qty 2

## 2015-02-11 MED ORDER — PROPOFOL INFUSION 10 MG/ML OPTIME
INTRAVENOUS | Status: DC | PRN
  Administered 2015-02-11: 25 ug/kg/min via INTRAVENOUS

## 2015-02-11 MED ORDER — PROPOFOL 10 MG/ML IV BOLUS
INTRAVENOUS | Status: AC
  Filled 2015-02-11: qty 20

## 2015-02-11 MED ORDER — MIDAZOLAM HCL 2 MG/2ML IJ SOLN
INTRAMUSCULAR | Status: AC
  Filled 2015-02-11: qty 2

## 2015-02-11 MED ORDER — VANCOMYCIN HCL 10 G IV SOLR: 1500.0000 mg | Freq: Once | INTRAVENOUS | Status: DC

## 2015-02-11 MED ORDER — PROPOFOL 10 MG/ML IV BOLUS
INTRAVENOUS | Status: DC | PRN
  Administered 2015-02-11 (×2): 20 mg via INTRAVENOUS

## 2015-02-11 MED ORDER — SCOPOLAMINE 1 MG/3DAYS TD PT72: 1.0000 | MEDICATED_PATCH | Freq: Once | TRANSDERMAL | Status: DC | PRN

## 2015-02-11 MED ORDER — FENTANYL CITRATE (PF) 100 MCG/2ML IJ SOLN
50.0000 ug | INTRAMUSCULAR | Status: DC | PRN
  Administered 2015-02-11: 50 ug via INTRAVENOUS

## 2015-02-11 MED ORDER — LACTATED RINGERS IV SOLN
INTRAVENOUS | Status: DC
  Administered 2015-02-11: 13:00:00 via INTRAVENOUS

## 2015-02-11 SURGICAL SUPPLY — 56 items
BAG DECANTER FOR FLEXI CONT (MISCELLANEOUS) IMPLANT
BANDAGE COBAN STERILE 2 (GAUZE/BANDAGES/DRESSINGS) ×1 IMPLANT
BANDAGE ELASTIC 3 VELCRO ST LF (GAUZE/BANDAGES/DRESSINGS) IMPLANT
BLADE MINI RND TIP GREEN BEAV (BLADE) IMPLANT
BLADE SURG 15 STRL LF DISP TIS (BLADE) ×2 IMPLANT
BLADE SURG 15 STRL SS (BLADE) ×4
BNDG CMPR 9X4 STRL LF SNTH (GAUZE/BANDAGES/DRESSINGS) ×1
BNDG CMPR MD 5X2 ELC HKLP STRL (GAUZE/BANDAGES/DRESSINGS)
BNDG COHESIVE 1X5 TAN STRL LF (GAUZE/BANDAGES/DRESSINGS) IMPLANT
BNDG ELASTIC 2 VLCR STRL LF (GAUZE/BANDAGES/DRESSINGS) IMPLANT
BNDG ESMARK 4X9 LF (GAUZE/BANDAGES/DRESSINGS) ×1 IMPLANT
BNDG GAUZE 1X2.1 STRL (MISCELLANEOUS) IMPLANT
BNDG GAUZE ELAST 4 BULKY (GAUZE/BANDAGES/DRESSINGS) ×1 IMPLANT
BRUSH SCRUB EZ PLAIN DRY (MISCELLANEOUS) ×2 IMPLANT
CHLORAPREP W/TINT 26ML (MISCELLANEOUS) ×2 IMPLANT
CORDS BIPOLAR (ELECTRODE) ×2 IMPLANT
COVER BACK TABLE 60X90IN (DRAPES) ×2 IMPLANT
COVER MAYO STAND STRL (DRAPES) ×2 IMPLANT
CUFF TOURNIQUET SINGLE 18IN (TOURNIQUET CUFF) ×1 IMPLANT
CUFF TOURNIQUET SINGLE 24IN (TOURNIQUET CUFF) ×1 IMPLANT
DRAPE EXTREMITY T 121X128X90 (DRAPE) ×2 IMPLANT
DRAPE SURG 17X23 STRL (DRAPES) ×2 IMPLANT
GAUZE PACKING IODOFORM 1/4X15 (GAUZE/BANDAGES/DRESSINGS) ×1 IMPLANT
GAUZE SPONGE 4X4 12PLY STRL (GAUZE/BANDAGES/DRESSINGS) ×2 IMPLANT
GAUZE XEROFORM 1X8 LF (GAUZE/BANDAGES/DRESSINGS) ×1 IMPLANT
GLOVE BIO SURGEON STRL SZ7 (GLOVE) ×1 IMPLANT
GLOVE BIO SURGEON STRL SZ7.5 (GLOVE) ×2 IMPLANT
GLOVE BIOGEL PI IND STRL 7.5 (GLOVE) IMPLANT
GLOVE BIOGEL PI IND STRL 8 (GLOVE) ×1 IMPLANT
GLOVE BIOGEL PI INDICATOR 7.5 (GLOVE) ×1
GLOVE BIOGEL PI INDICATOR 8 (GLOVE) ×1
GLOVE EXAM NITRILE MD LF STRL (GLOVE) ×1 IMPLANT
GOWN STRL REUS W/ TWL LRG LVL3 (GOWN DISPOSABLE) ×1 IMPLANT
GOWN STRL REUS W/TWL LRG LVL3 (GOWN DISPOSABLE) ×2
GOWN STRL REUS W/TWL XL LVL3 (GOWN DISPOSABLE) ×2 IMPLANT
LOOP VESSEL MAXI BLUE (MISCELLANEOUS) IMPLANT
NDL HYPO 25X1 1.5 SAFETY (NEEDLE) IMPLANT
NEEDLE HYPO 25X1 1.5 SAFETY (NEEDLE) IMPLANT
NS IRRIG 1000ML POUR BTL (IV SOLUTION) ×2 IMPLANT
PACK BASIN DAY SURGERY FS (CUSTOM PROCEDURE TRAY) ×2 IMPLANT
PAD CAST 3X4 CTTN HI CHSV (CAST SUPPLIES) IMPLANT
PADDING CAST ABS 4INX4YD NS (CAST SUPPLIES)
PADDING CAST ABS COTTON 4X4 ST (CAST SUPPLIES) ×1 IMPLANT
PADDING CAST COTTON 3X4 STRL (CAST SUPPLIES)
SPLINT PLASTER CAST XFAST 3X15 (CAST SUPPLIES) IMPLANT
SPLINT PLASTER XTRA FASTSET 3X (CAST SUPPLIES)
STOCKINETTE 4X48 STRL (DRAPES) ×2 IMPLANT
SUT ETHILON 4 0 PS 2 18 (SUTURE) IMPLANT
SWAB COLLECTION DEVICE MRSA (MISCELLANEOUS) ×1 IMPLANT
SYR BULB 3OZ (MISCELLANEOUS) ×2 IMPLANT
SYR CONTROL 10ML LL (SYRINGE) IMPLANT
TOWEL OR 17X24 6PK STRL BLUE (TOWEL DISPOSABLE) ×4 IMPLANT
TRAY DSU PREP LF (CUSTOM PROCEDURE TRAY) ×1 IMPLANT
TUBE ANAEROBIC SPECIMEN COL (MISCELLANEOUS) ×1 IMPLANT
TUBE FEEDING 5FR 15 INCH (TUBING) IMPLANT
UNDERPAD 30X30 (UNDERPADS AND DIAPERS) ×2 IMPLANT

## 2015-02-11 NOTE — Progress Notes (Signed)
Pre visit review using our clinic review tool, if applicable. No additional management support is needed unless otherwise documented below in the visit note. 

## 2015-02-11 NOTE — Patient Instructions (Addendum)
YOU NEED TO GO DIRECTLY TO THE ER, YOU MAY NEED IMMEDIATE SURGERY AND IV ANTIBIOTICS     Cellulitis Cellulitis is an infection of the skin and the tissue beneath it. The infected area is usually red and tender. Cellulitis occurs most often in the arms and lower legs.  CAUSES  Cellulitis is caused by bacteria that enter the skin through cracks or cuts in the skin. The most common types of bacteria that cause cellulitis are staphylococci and streptococci. SIGNS AND SYMPTOMS   Redness and warmth.  Swelling.  Tenderness or pain.  Fever. DIAGNOSIS  Your health care provider can usually determine what is wrong based on a physical exam. Blood tests may also be done. TREATMENT  Treatment usually involves taking an antibiotic medicine. HOME CARE INSTRUCTIONS   Take your antibiotic medicine as directed by your health care provider. Finish the antibiotic even if you start to feel better.  Keep the infected arm or leg elevated to reduce swelling.  Apply a warm cloth to the affected area up to 4 times per day to relieve pain.  Take medicines only as directed by your health care provider.  Keep all follow-up visits as directed by your health care provider. SEEK MEDICAL CARE IF:   You notice red streaks coming from the infected area.  Your red area gets larger or turns dark in color.  Your bone or joint underneath the infected area becomes painful after the skin has healed.  Your infection returns in the same area or another area.  You notice a swollen bump in the infected area.  You develop new symptoms.  You have a fever. SEEK IMMEDIATE MEDICAL CARE IF:   You feel very sleepy.  You develop vomiting or diarrhea.  You have a general ill feeling (malaise) with muscle aches and pains. MAKE SURE YOU:   Understand these instructions.  Will watch your condition.  Will get help right away if you are not doing well or get worse. Document Released: 05/10/2005 Document Revised:  12/15/2013 Document Reviewed: 10/16/2011 [facility] Patient Information 2015 [place], [place]. This information is not intended to replace advice given to you by your health care provider. Make sure you discuss any questions you have with your health care provider.

## 2015-02-11 NOTE — Transfer of Care (Signed)
Immediate Anesthesia Transfer of Care Note  Patient: [name]  Procedure(s) Performed: Procedure(s) with comments: IRRIGATION AND DEBRIDEMENT LEFT LONG FINGER (Left) - I and D Left Long Finger  Patient Location: PACU  Anesthesia Type:MAC and Regional  Level of Consciousness: awake, alert  and oriented  Airway & Oxygen Therapy: Patient Spontanous Breathing and Patient connected to nasal cannula oxygen  Post-op Assessment: Report given to RN and Post -op Vital signs reviewed and stable  Post vital signs: Reviewed and stable  Last Vitals:  Filed Vitals:   02/11/15 1445  BP: 128/49  Pulse: 71  Temp:   Resp: 22    Complications: No apparent anesthesia complications

## 2015-02-11 NOTE — Progress Notes (Signed)
Assisted Dr. Singer with left, ultrasound guided, supraclavicular block. Side rails up, monitors on throughout procedure. See vital signs in flow sheet. Tolerated Procedure well. 

## 2015-02-11 NOTE — Progress Notes (Signed)
Assisted Dr. Tobias Alexander with left, ultrasound guided, axillary (supplementation) block. Side rails up, monitors on throughout procedure. See vital signs in flow sheet. Tolerated Procedure well.

## 2015-02-11 NOTE — Discharge Instructions (Addendum)

## 2015-02-11 NOTE — ED Notes (Signed)
Pt being sent over by Dr Ronnald Ramp at [facility]. Pt has abscess to hand, may need to see a hand surgeon.

## 2015-02-11 NOTE — H&P (Signed)
[name] is an 59 y.o. male.   Chief Complaint: left long finger abscess HPI: 59 yo rhd male with swelling and erythema of left long finger x 4 days.  States it started as an ingrown hair that he tried to pop on a few occasions.  It has worsened in past few days.  No fevers, chills, night sweats.  Past Medical History  Diagnosis Date  . Heparin induced thrombocytopenia   . Hypertension   . Hyperlipidemia   . Obesity   . Edema   . COPD (chronic obstructive pulmonary disease)   . GERD (gastroesophageal reflux disease)   . Basal cell carcinoma of nose   . Chronic lower back pain   . Depression   . Myocardial infarction   . Heart murmur   . Dysrhythmia     fluttering  . Anginal pain     not had to use in awhile  none in 10 years  . CHF (congestive heart failure)   . Shortness of breath dyspnea     with exertion   . Anxiety   . Blood dyscrasia     trouble clotting   . Coronary artery disease     CABG 2005. s/p PTCA and stenting of the saphenous vein graft to PDA and saphenous vein graft to obtuse marginal by Dr. Burt Knack 09/29/11. Normal EF at cath 09/2011  . Sleep apnea     "used to"      could not use 2006  . Carotid artery disease     s/p L CEA 2009 (hx of evacuation of hematoma due to heparin)  . Lymphoma 12/21/2014  . Diabetes mellitus without complication     borderline     Past Surgical History  Procedure Laterality Date  . Evacuation of hematoma  03/2008    left neck; S/P endarterectomy; "cause heparin clotted it up"  . Coronary angioplasty with stent placement  09/29/11    "2"  . Carotid endarterectomy  03/2008    left  . Coronary artery bypass graft  2005    CABG X3  . Basal cell carcinoma excision  2000's    nose  . Percutaneous coronary stent intervention (pci-s) N/A 09/29/2011    Procedure: PERCUTANEOUS CORONARY STENT INTERVENTION (PCI-S);  Surgeon: Sherren Mocha, MD;  Location: [facility] CATH LAB;  Service: Cardiovascular;  Laterality: N/A;  . Mass excision Left  12/11/2014    Procedure: EXCISIONAL BIOPSY OF LEFT SUPRA CLAVICULAR NECK MASS;  Surgeon: Jerrell Belfast, MD;  Location: [place];  Service: ENT;  Laterality: Left;  . Superficial lymph node biopsy / excision Left   . Portacath placement  2016    Family History  Problem Relation Age of Onset  . Heart disease Father   . Bone cancer Mother    Social History:  reports that he has quit smoking. His smoking use included Cigarettes. He has a 82 pack-year smoking history. He has quit using smokeless tobacco. His smokeless tobacco use included Chew. He reports that he uses illicit drugs (Marijuana). He reports that he does not drink alcohol.  Allergies:  Allergies  Allergen Reactions  . Bendamustine Hcl Rash    See hospital notes from 01/12/15  . Heparin Other (See Comments)    Opposite reaction Heparin induced thrombocytopenia  . Tape Rash    Medications Prior to Admission  Medication Sig Dispense Refill  . acyclovir (ZOVIRAX) 400 MG tablet Take 400 mg by mouth daily.  3  . albuterol (PROVENTIL) (2.5 MG/3ML) 0.083% nebulizer  solution Take 3 mLs (2.5 mg total) by nebulization every 2 (two) hours as needed for wheezing. 75 mL 2  . allopurinol (ZYLOPRIM) 300 MG tablet Take 300 mg by mouth daily.  3  . ANORO ELLIPTA 62.5-25 MCG/INH AEPB as needed.    Marland Kitchen aspirin EC 81 MG tablet Take 81 mg by mouth daily.    . clonazePAM (KLONOPIN) 0.5 MG tablet TAKE 1 TABLET BY MOUTH TWICE A DAY AS NEEDED FOR ANXIETY or at hs for insomnia 180 tablet 0  . clopidogrel (PLAVIX) 75 MG tablet TAKE 1 TABLET BY MOUTH DAILY WITH BREAKFAST. 90 tablet 3  . hydrALAZINE (APRESOLINE) 50 MG tablet Take 1 tablet (50 mg total) by mouth every 8 (eight) hours. 90 tablet 0  . metoprolol succinate (TOPROL-XL) 50 MG 24 hr tablet TAKE 2 TABLETS EVERY DAY 180 tablet 3  . oxyCODONE-acetaminophen (PERCOCET/ROXICET) 5-325 MG per tablet Take 1 tablet by mouth every 6 (six) hours as needed for moderate pain. 120 tablet 0  . potassium chloride  SA (KLOR-CON M20) 20 MEQ tablet TAKE 1 TABLET BY MOUTH DAILY 90 tablet 3  . predniSONE (DELTASONE) 10 MG tablet Take 4 tablets (40 mg) daily for one week, then, Take 3 tablets (30 mg) daily for one week,then, Take 2 tablets (20 mg) daily for one week, then, Take 1 tablet   (10 mg) daily for one week and then stop 70 tablet 0  . simvastatin (ZOCOR) 20 MG tablet TAKE 1 TABLET (20 MG TOTAL) BY MOUTH AT BEDTIME. 90 tablet 3  . torsemide (DEMADEX) 100 MG tablet Take 100 mg by mouth daily.    . nitroGLYCERIN (NITROSTAT) 0.4 MG SL tablet Place 1 tablet (0.4 mg total) under the tongue every 5 (five) minutes as needed for chest pain (Up to 3 doses). 25 tablet 4  . tretinoin (RETIN-A) 0.025 % cream Apply 1 application topically as needed.     . triamcinolone ointment (KENALOG) 0.1 % Apply topically 3 (three) times daily. 30 g 0    No results found for this or any previous visit (from the past 48 hour(s)).  No results found.   A comprehensive review of systems was negative except for: Eyes: positive for cataracts Respiratory: positive for shortness of breath Hematologic/lymphatic: positive for bleeding  Blood pressure 121/44, pulse 80, temperature 98.9 F (37.2 C), temperature source Oral, resp. rate 18, height 6' (1.829 m), weight 157.455 kg (347 lb 2 oz), SpO2 95 %.  General appearance: alert, cooperative and appears stated age Head: Normocephalic, without obvious abnormality, atraumatic Neck: supple, symmetrical, trachea midline Resp: clear to auscultation bilaterally Cardio: regular rate and rhythm GI: non tender Extremities: intact sensation and capillary refill all digits.  +epl/fpl/io.  left long with erythema and fluctuance dorsally.  swelling into hand.  no proximal streaking Pulses: 2+ and symmetric Skin: Skin color, texture, turgor normal. No rashes or lesions Neurologic: Grossly normal Incision/Wound: Small wound dorsum left long finger  Assessment/Plan Left long finger  abscess.  Non operative and operative treatment options were discussed with the patient and patient wishes to proceed with operative treatment. Recommend OR for incision and drainage.  Risks, benefits, and alternatives of surgery were discussed and the patient agrees with the plan of care.   Aunesty Tyson R 02/11/2015, 1:07 PM

## 2015-02-11 NOTE — Progress Notes (Signed)
Subjective:  Patient ID: [name], male    DOB: 01-16-1956  Age: 59 y.o. MRN: [id number]  CC: Hand Pain  New to me  HPI [name] presents for a one week hx of worsening left hand pain, redness, and swelling. There was no prior trauma or injury.  Outpatient Prescriptions Prior to Visit  Medication Sig Dispense Refill  . albuterol (PROVENTIL) (2.5 MG/3ML) 0.083% nebulizer solution Take 3 mLs (2.5 mg total) by nebulization every 2 (two) hours as needed for wheezing. 75 mL 2  . ANORO ELLIPTA 62.5-25 MCG/INH AEPB as needed.    Marland Kitchen aspirin EC 81 MG tablet Take 81 mg by mouth daily.    . clonazePAM (KLONOPIN) 0.5 MG tablet TAKE 1 TABLET BY MOUTH TWICE A DAY AS NEEDED FOR ANXIETY or at hs for insomnia 180 tablet 0  . clopidogrel (PLAVIX) 75 MG tablet TAKE 1 TABLET BY MOUTH DAILY WITH BREAKFAST. 90 tablet 3  . hydrALAZINE (APRESOLINE) 50 MG tablet Take 1 tablet (50 mg total) by mouth every 8 (eight) hours. 90 tablet 0  . metoprolol succinate (TOPROL-XL) 50 MG 24 hr tablet TAKE 2 TABLETS EVERY DAY 180 tablet 3  . nitroGLYCERIN (NITROSTAT) 0.4 MG SL tablet Place 1 tablet (0.4 mg total) under the tongue every 5 (five) minutes as needed for chest pain (Up to 3 doses). 25 tablet 4  . oxyCODONE-acetaminophen (PERCOCET/ROXICET) 5-325 MG per tablet Take 1 tablet by mouth every 6 (six) hours as needed for moderate pain. 120 tablet 0  . potassium chloride SA (KLOR-CON M20) 20 MEQ tablet TAKE 1 TABLET BY MOUTH DAILY 90 tablet 3  . predniSONE (DELTASONE) 10 MG tablet Take 4 tablets (40 mg) daily for one week, then, Take 3 tablets (30 mg) daily for one week,then, Take 2 tablets (20 mg) daily for one week, then, Take 1 tablet   (10 mg) daily for one week and then stop 70 tablet 0  . simvastatin (ZOCOR) 20 MG tablet TAKE 1 TABLET (20 MG TOTAL) BY MOUTH AT BEDTIME. 90 tablet 3  . torsemide (DEMADEX) 100 MG tablet Take 100 mg by mouth daily.    Marland Kitchen tretinoin (RETIN-A) 0.025 % cream Apply 1 application  topically as needed.     . triamcinolone ointment (KENALOG) 0.1 % Apply topically 3 (three) times daily. 30 g 0   No facility-administered medications prior to visit.    ROS Review of Systems  Constitutional: Negative.  Negative for fever, chills, diaphoresis, appetite change and fatigue.  HENT: Negative.   Eyes: Negative.   Respiratory: Negative.  Negative for cough, choking, chest tightness, shortness of breath and stridor.   Cardiovascular: Negative.  Negative for chest pain, palpitations and leg swelling.  Gastrointestinal: Negative.   Endocrine: Negative.   Genitourinary: Negative.   Musculoskeletal: Negative.   Skin: Positive for wound. Negative for color change, pallor and rash.  Allergic/Immunologic: Negative.   Neurological: Negative.   Hematological: Negative.  Negative for adenopathy. Does not bruise/bleed easily.  Psychiatric/Behavioral: Negative.     Objective:  BP 118/58 mmHg  Pulse 77  Temp(Src) 98.3 F (36.8 C) (Oral)  Resp 16  Ht 6' (1.829 m)  Wt 348 lb (157.852 kg)  BMI 47.19 kg/m2  SpO2 93%  BP Readings from Last 3 Encounters:  02/11/15 118/58  02/03/15 120/43  01/28/15 132/51    Wt Readings from Last 3 Encounters:  02/11/15 348 lb (157.852 kg)  02/03/15 339 lb 11.2 oz (154.087 kg)  01/28/15 339 lb 14.4  oz (154.178 kg)    Physical Exam  Constitutional: He is oriented to person, place, and time.  Non-toxic appearance. He does not have a sickly appearance. He does not appear ill. No distress.  HENT:  Mouth/Throat: Oropharynx is clear and moist. No oropharyngeal exudate.  Eyes: Conjunctivae are normal. Right eye exhibits no discharge. Left eye exhibits no discharge. No scleral icterus.  Neck: Normal range of motion. Neck supple. No JVD present. No tracheal deviation present. No thyromegaly present.  Cardiovascular: Normal rate, regular rhythm, normal heart sounds and intact distal pulses.  Exam reveals no gallop and no friction rub.   No murmur  heard. Pulmonary/Chest: Effort normal and breath sounds normal. No stridor. No respiratory distress. He has no wheezes. He has no rales. He exhibits no tenderness.  Abdominal: Soft. Bowel sounds are normal. He exhibits no distension and no mass. There is no tenderness. There is no rebound and no guarding.  Musculoskeletal: Normal range of motion. He exhibits tenderness. He exhibits no edema.       Hands: Lymphadenopathy:    He has no cervical adenopathy.  Neurological: He is oriented to person, place, and time.  Skin: Skin is warm. No rash noted. He is not diaphoretic. There is erythema. No pallor.    Lab Results  Component Value Date   WBC 12.4* 01/28/2015   HGB 13.8 01/28/2015   HCT 42.2 01/28/2015   PLT 157 01/28/2015   GLUCOSE 177* 01/28/2015   CHOL 131 03/25/2014   TRIG 185.0* 03/25/2014   HDL 24.00* 03/25/2014   LDLCALC 70 03/25/2014   ALT 33 01/28/2015   AST 16 01/28/2015   NA 142 01/28/2015   K 4.0 01/28/2015   CL 99 01/27/2015   CREATININE 0.8 01/28/2015   BUN 20.5 01/28/2015   CO2 34* 01/28/2015   TSH 2.01 02/25/2013   PSA 0.23 09/15/2011   INR 1.00 12/28/2014   HGBA1C 6.5 01/27/2015    No results found.  Assessment & Plan:   Hendrik was seen today for hand pain.  Diagnoses and all orders for this visit:  Cellulitis of hand, left - he has a deep space infection in his left middle finger extending over the left hand, I am concerned about MRSA, he has several immuno-comprimising isssues so I think he needs urgent evaluation by a hand surgeon to consider debridement and possibly IV antibiotics, I spoke to Dt. Kuzma's office and they are willing to see him now, the pt was sent there with all of Dr. Levell July info, the other option is to go directly to the ER, the pt chooses Dr. Levell July office and aggred to go directly there, he will maintain an NPO status and will take a driver with him Orders: -     Ambulatory referral to Orthopedic Surgery  I am having Mr. Mazor  maintain his aspirin EC, nitroGLYCERIN, clopidogrel, metoprolol succinate, potassium chloride SA, simvastatin, torsemide, albuterol, clonazePAM, triamcinolone ointment, hydrALAZINE, predniSONE, tretinoin, ANORO ELLIPTA, oxyCODONE-acetaminophen, allopurinol, and acyclovir.  Meds ordered this encounter  Medications  . allopurinol (ZYLOPRIM) 300 MG tablet    Sig: Take 300 mg by mouth daily.    Refill:  3  . acyclovir (ZOVIRAX) 400 MG tablet    Sig: Take 400 mg by mouth daily.    Refill:  3     Follow-up: Return in about 1 day (around 02/12/2015).  Scarlette Calico, MD

## 2015-02-11 NOTE — Anesthesia Postprocedure Evaluation (Signed)
Anesthesia Post Note  Patient: [name]  Procedure(s) Performed: Procedure(s) (LRB): IRRIGATION AND DEBRIDEMENT LEFT LONG FINGER (Left)  Anesthesia type: MAC  Patient location: PACU  Post pain: Pain level controlled  Post assessment: Patient's Cardiovascular Status Stable  Last Vitals:  Filed Vitals:   02/11/15 1645  BP:   Pulse: 67  Temp:   Resp: 20    Post vital signs: Reviewed and stable  Level of consciousness: sedated  Complications: No apparent anesthesia complications

## 2015-02-11 NOTE — Anesthesia Procedure Notes (Addendum)
Anesthesia Regional Block:  Supraclavicular block  Pre-Anesthetic Checklist: ,, timeout performed, Correct Patient, Correct Site, Correct Laterality, Correct Procedure,, site marked, risks and benefits discussed, Surgical consent,  Pre-op evaluation,  At surgeon's request and post-op pain management  Laterality: Left  Prep: chloraprep       Needles:  Injection technique: Single-shot  Needle Type: Echogenic Stimulator Needle     Needle Length: 5cm 5 cm Needle Gauge: 22 and 22 G    Additional Needles:  Procedures: ultrasound guided (picture in chart) and nerve stimulator Supraclavicular block  Nerve Stimulator or Paresthesia:  Response: bicep contraction, 0.48 mA,   Additional Responses:   Narrative:  Start time: 02/11/2015 1:42 PM End time: 02/11/2015 2:53 PM Injection made incrementally with aspirations every 5 mL.  Performed by: Personally   Additional Notes: Functioning IV was confirmed and monitors applied.  A 29mm 22ga echogenic arrow stimulator was used. Sterile prep and drape,hand hygiene and sterile gloves were used.Ultrasound guidance: relevent anatomy identified, needle position confirmed, local anesthetic spread visualized around nerve(s)., vascular puncture avoided.  Image printed for medical record.  Negative aspiration and negative test dose prior to incremental administration of local anesthetic. The patient tolerated the procedure well.   Anesthesia Regional Block:  Axillary brachial plexus block  Pre-Anesthetic Checklist: ,, timeout performed, Correct Patient, Correct Site, Correct Laterality, Correct Procedure, Correct Position, site marked, Risks and benefits discussed,  Surgical consent,  Pre-op evaluation,  At surgeon's request and post-op pain management  Laterality: Left  Prep: chloraprep       Needles:  Injection technique: Single-shot  Needle Type: Echogenic Needle      Needle Gauge: 20 and 20 G    Additional Needles:  Procedures:  ultrasound guided (picture in chart) and nerve stimulator Axillary brachial plexus block Narrative:  Start time: 02/11/2015 2:43 PM End time: 02/11/2015 2:53 PM  Additional Notes: Previous SCB incomplete, axillary supplementation to improve analgesia.

## 2015-02-11 NOTE — Anesthesia Preprocedure Evaluation (Addendum)
Anesthesia Evaluation  Patient identified by MRN, date of birth, ID band Patient awake    Reviewed: Allergy & Precautions, NPO status , Patient's Chart, lab work & pertinent test results  History of Anesthesia Complications Negative for: history of anesthetic complications  Airway Mallampati: III  TM Distance: >3 FB Neck ROM: Full    Dental  (+) Poor Dentition, Missing, Dental Advisory Given   Pulmonary sleep apnea , COPDformer smoker,    + decreased breath sounds      Cardiovascular hypertension, + angina + CAD, + Past MI, + Peripheral Vascular Disease and +CHF + dysrhythmias Rhythm:Irregular Rate:Normal     Neuro/Psych PSYCHIATRIC DISORDERS Anxiety Depression negative neurological ROS     GI/Hepatic GERD-  ,  Endo/Other  diabetes  Renal/GU negative Renal ROS     Musculoskeletal   Abdominal   Peds  Hematology   Anesthesia Other Findings   Reproductive/Obstetrics                           Anesthesia Physical Anesthesia Plan  ASA: III  Anesthesia Plan: Regional   Post-op Pain Management:    Induction: Intravenous  Airway Management Planned: Simple Face Mask  Additional Equipment:   Intra-op Plan:   Post-operative Plan:   Informed Consent: I have reviewed the patients History and Physical, chart, labs and discussed the procedure including the risks, benefits and alternatives for the proposed anesthesia with the patient or authorized representative who has indicated his/her understanding and acceptance.   Dental advisory given  Plan Discussed with: CRNA, Anesthesiologist and Surgeon  Anesthesia Plan Comments:         Anesthesia Quick Evaluation                                  Anesthesia Evaluation  Patient identified by MRN, date of birth, ID band Patient awake    Reviewed: Allergy & Precautions, NPO status , Patient's Chart, lab work & pertinent test  results  History of Anesthesia Complications Negative for: history of anesthetic complications  Airway Mallampati: IV  TM Distance: >3 FB Neck ROM: Full    Dental  (+) Poor Dentition, Missing,    Pulmonary shortness of breath, sleep apnea , COPD COPD inhaler, Current Smoker,  breath sounds clear to auscultation        Cardiovascular hypertension, Pt. on medications and Pt. on home beta blockers + CAD, + Past MI, + Peripheral Vascular Disease and +CHF + Valvular Problems/Murmurs Rhythm:Regular + Systolic murmurs    Neuro/Psych PSYCHIATRIC DISORDERS Anxiety Depression negative neurological ROS     GI/Hepatic GERD-  Controlled,  Endo/Other  diabetesMorbid obesity  Renal/GU      Musculoskeletal   Abdominal   Peds  Hematology   Anesthesia Other Findings   Reproductive/Obstetrics                            Anesthesia Physical Anesthesia Plan  ASA: III  Anesthesia Plan: General   Post-op Pain Management:    Induction: Intravenous  Airway Management Planned: Oral ETT  Additional Equipment: None  Intra-op Plan:   Post-operative Plan: Extubation in OR  Informed Consent: I have reviewed the patients History and Physical, chart, labs and discussed the procedure including the risks, benefits and alternatives for the proposed anesthesia with the patient or authorized representative who has indicated his/her understanding and  acceptance.   Dental advisory given  Plan Discussed with: CRNA and Surgeon  Anesthesia Plan Comments:         Anesthesia Quick Evaluation

## 2015-02-11 NOTE — Brief Op Note (Signed)
02/11/2015  4:22 PM  PATIENT:  [name]  59 y.o. male  PRE-OPERATIVE DIAGNOSIS:  infected left long finger  POST-OPERATIVE DIAGNOSIS: left long finger abscess  PROCEDURE:  Procedure(s) with comments: IRRIGATION AND DEBRIDEMENT LEFT LONG FINGER (Left) - I and D Left Long Finger  SURGEON:  Surgeon(s) and Role:    * Leanora Cover, MD - Primary  PHYSICIAN ASSISTANT:   ASSISTANTS: none   ANESTHESIA:   regional  EBL:  Total I/O In: 400 [I.V.:400] Out: -   BLOOD ADMINISTERED:none  DRAINS: iodoform packing  LOCAL MEDICATIONS USED:  NONE  SPECIMEN:  Source of Specimen:  left long finger  DISPOSITION OF SPECIMEN:  micro  COUNTS:  YES  TOURNIQUET:   Total Tourniquet Time Documented: Upper Arm (Left) - 15 minutes Total: Upper Arm (Left) - 15 minutes   DICTATION: .Other Dictation: Dictation Number [id number]  PLAN OF CARE: Discharge to home after PACU  PATIENT DISPOSITION:  PACU - hemodynamically stable.

## 2015-02-11 NOTE — Op Note (Signed)
[id number] 

## 2015-02-12 ENCOUNTER — Telehealth: Payer: Self-pay | Admitting: *Deleted

## 2015-02-12 ENCOUNTER — Encounter (HOSPITAL_BASED_OUTPATIENT_CLINIC_OR_DEPARTMENT_OTHER): Payer: Self-pay | Admitting: Orthopedic Surgery

## 2015-02-12 NOTE — Telephone Encounter (Signed)
Informed patient of MD Alvy Bimler message. Patient verbalized understanding.

## 2015-02-12 NOTE — Telephone Encounter (Signed)
Patient would like to inform MD of recent hand surgery. Patient treatment has been postponed for 2 weeks due to leg injury. Patient has a treatment comin up 02/18/15. Message sent to MD Alvy Bimler and RN.

## 2015-02-12 NOTE — Addendum Note (Signed)
Addendum  created 02/12/15 1153 by Tawni Millers, CRNA   Modules edited: Charges VN

## 2015-02-12 NOTE — Op Note (Signed)
NAME:  [name], [name] NO.:  [id number]  MEDICAL RECORD NO.:  [id number]  LOCATION:  ED                           FACILITY:  [facility]  PHYSICIAN:  Leanora Cover, MD        DATE OF BIRTH:  [birth year]  DATE OF PROCEDURE:  02/11/2015 DATE OF DISCHARGE:  02/11/2015                              OPERATIVE REPORT   PREOPERATIVE DIAGNOSIS:  Left long finger abscess.  POSTOPERATIVE DIAGNOSIS:  Left long finger abscess.  PROCEDURE:  Incision and drainage, left long finger abscess.  SURGEON:  Leanora Cover, MD  ASSISTANT:  None.  ANESTHESIA:  Regional IV.  FLUIDS:  Per Anesthesia flow sheet.  ESTIMATED BLOOD LOSS:  Minimal.  COMPLICATIONS:  None.  SPECIMENS:  Cultures to micro.  TOURNIQUET TIME:  Fifteen minutes.  DISPOSITION:  Stable to PACU.  INDICATIONS:  [name] is a 58 year old right-hand dominant male for past 4 days who has been having issues to left long finger.  He states he had an ingrown hair that he tried to pop a couple of times and his fingers became more swollen, erythematous, and painful.  He was seen by his primary care physician who referred to me for further care.  He has had no fevers, chills, or night sweats.  On examination, he had an erythematous swollen left long finger.  There was some swelling in the hand.  No proximal streaking.  I recommended incision and drainage in the operating room.  Risks, benefits, and alternatives of surgery were discussed including risk of blood loss; infection; damage to nerves, vessels, tendons, ligaments, bone; failure of surgery; need for additional surgery; complications with wound healing; continued pain; continued infection; need for repeat irrigation and debridement.  He voiced understanding of these risks and elected to proceed.  OPERATIVE COURSE:  After being identified preoperatively by myself, the patient and I agreed upon procedure and site procedure.  Surgical site was marked.  The risks,  benefits, and alternatives of surgery were reviewed and wished to proceed.  Surgical consent had been signed. Antibiotics were held for cultures.  Regional block was performed by anesthesia in proper point.  He was transferred to the operating room, placed on the operating room table in supine position with left upper extremity on arm board.  Left upper extremity was prepped and draped in normal sterile orthopedic fashion.  A surgical pause was performed between surgeons, anesthesia, and operating staff, and all were in agreement as to the patient, procedure, and site of procedure. Tourniquet at the proximal aspect of the extremity was inflated to 250 mmHg after gravity exsanguination of the fingers and hand, and an Esmarch exsanguination of the forearm.  Incision was made on the dorsum of the long finger in the area of the abscess.  This was carried into subcutaneous tissues by spreading technique.  Gross purulence was encountered.  Cultures were taken for aerobes and anaerobes.  The cavity was well defined.  It was traced.  All purulence was removed.  Any devitalized subcutaneous fat was removed as well.  This was done with a combination of the rongeurs and Ray-Tec sponge.  The infection tracked toward the radial side of the  finger.  The neurovascular bundle was identified and was intact.  The infection for the abscess also coursed ulnarly over top of the extensor tendon.  This was debrided as well. The wound was copiously irrigated with a 1000 mL of sterile saline by bulb syringe.  It was then packed open with quarter-inch iodoform gauze. The wound was dressed with sterile 4x4s and wrapped with a Kerlix and a Coban dressing lightly.  Tourniquet was deflated at 15 minutes. Fingertips were pink with brisk capillary refill after deflation of tourniquet.  Operative drapes were broken down.  The patient was awoken from anesthesia safely.  He was transferred back to stretcher and taken to  PACU in stable condition.  I will see him back in the office in 3-4 days for postoperative followup.  I will give him hydrocodone 5/325, 1-2 p.o. q.6 hours p.r.n. pain, dispensed #30 and Bactrim DS 1 p.o. b.i.d. x7 days.     Leanora Cover, MD     KK/MEDQ  D:  02/11/2015  T:  02/12/2015  Job:  [id number]

## 2015-02-12 NOTE — Op Note (Deleted)
NAME:  [name], COMMON NO.:  [id number]  MEDICAL RECORD NO.:  [id number]  LOCATION:  ED                           FACILITY:  [facility]  PHYSICIAN:  Leanora Cover, MD        DATE OF BIRTH:  04/14/1956  DATE OF PROCEDURE:  02/11/2015 DATE OF DISCHARGE:  02/11/2015                              OPERATIVE REPORT   PREOPERATIVE DIAGNOSIS:  Left long finger abscess.  POSTOPERATIVE DIAGNOSIS:  Left long finger abscess.  PROCEDURE:  Incision and drainage, left long finger abscess.  SURGEON:  Leanora Cover, MD  ASSISTANT:  None.  ANESTHESIA:  Regional IV.  FLUIDS:  Per Anesthesia flow sheet.  ESTIMATED BLOOD LOSS:  Minimal.  COMPLICATIONS:  None.  SPECIMENS:  Cultures to micro.  TOURNIQUET TIME:  Fifteen minutes.  DISPOSITION:  Stable to PACU.  INDICATIONS:  [name] is a 59 year old right-hand dominant male for past 4 days who has been having issues to left long finger.  He states he had an ingrown hair that he tried to pop a couple of times and his fingers became more swollen, erythematous, and painful.  He was seen by his primary care physician who referred to me for further care.  He has had no fevers, chills, or night sweats.  On examination, he had an erythematous swollen left long finger.  There was some swelling in the hand.  No proximal streaking.  I recommended incision and drainage in the operating room.  Risks, benefits, and alternatives of surgery were discussed including risk of blood loss; infection; damage to nerves, vessels, tendons, ligaments, bone; failure of surgery; need for additional surgery; complications with wound healing; continued pain; continued infection; need for repeat irrigation and debridement.  He voiced understanding of these risks and elected to proceed.  OPERATIVE COURSE:  After being identified preoperatively by myself, the patient and I agreed upon procedure and site procedure.  Surgical site was marked.  The risks,  benefits, and alternatives of surgery were reviewed and wished to proceed.  Surgical consent had been signed. Antibiotics were held for cultures.  Regional block was performed by anesthesia in proper point.  He was transferred to the operating room, placed on the operating room table in supine position with left upper extremity on arm board.  Left upper extremity was prepped and draped in normal sterile orthopedic fashion.  A surgical pause was performed between surgeons, anesthesia, and operating staff, and all were in agreement as to the patient, procedure, and site of procedure. Tourniquet at the proximal aspect of the extremity was inflated to 250 mmHg after gravity exsanguination of the fingers and hand, and an Esmarch exsanguination of the forearm.  Incision was made on the dorsum of the long finger in the area of the abscess.  This was carried into subcutaneous tissues by spreading technique.  Gross purulence was encountered.  Cultures were taken for aerobes and anaerobes.  The cavity was well defined.  It was traced.  All purulence was removed.  Any devitalized subcutaneous fat was removed as well.  This was done with a combination of the rongeurs and Ray-Tec sponge.  The infection tracked toward the radial side of the  finger.  The neurovascular bundle was identified and was intact.  The infection for the abscess also coursed ulnarly over top of the extensor tendon.  This was debrided as well. The wound was copiously irrigated with a 1000 mL of sterile saline by bulb syringe.  It was then packed open with quarter-inch iodoform gauze. The wound was dressed with sterile 4x4s and wrapped with a Kerlix and a Coban dressing lightly.  Tourniquet was deflated at 15 minutes. Fingertips were pink with brisk capillary refill after deflation of tourniquet.  Operative drapes were broken down.  The patient was awoken from anesthesia safely.  He was transferred back to stretcher and taken to  PACU in stable condition.  I will see him back in the office in 3-4 days for postoperative followup.  I will give him hydrocodone 5/325, 1-2 p.o. q.6 hours p.r.n. pain, dispensed #30 and Bactrim DS 1 p.o. b.i.d. x7 days.     Leanora Cover, MD     KK/MEDQ  D:  02/11/2015  T:  02/12/2015  Job:  [id number]

## 2015-02-12 NOTE — Telephone Encounter (Signed)
PLease tell him to call next week on 7/6. If the hand is not healed we will postpone treatment for another week

## 2015-02-14 LAB — CULTURE, ROUTINE-ABSCESS

## 2015-02-16 ENCOUNTER — Telehealth: Payer: Self-pay | Admitting: Nurse Practitioner

## 2015-02-16 LAB — ANAEROBIC CULTURE

## 2015-02-16 NOTE — Telephone Encounter (Signed)
Patient left voicemail on triage line to inform his finger wound is still open requiring packing and draining after recent surgery.  He anticipates chemo will continue to be on hold. He listed phone number [phone].

## 2015-02-17 ENCOUNTER — Other Ambulatory Visit: Payer: Self-pay | Admitting: Hematology and Oncology

## 2015-02-17 ENCOUNTER — Telehealth: Payer: Self-pay | Admitting: Hematology and Oncology

## 2015-02-17 NOTE — Telephone Encounter (Signed)
s.w. pt and advised on 7.7 cx and moved to 7.15...Marland Kitchenpt ok and aware

## 2015-02-17 NOTE — Telephone Encounter (Signed)
I placed POF to move all his appointments to 7/15

## 2015-02-18 ENCOUNTER — Other Ambulatory Visit: Payer: BLUE CROSS/BLUE SHIELD

## 2015-02-18 ENCOUNTER — Ambulatory Visit: Payer: BLUE CROSS/BLUE SHIELD | Admitting: Hematology and Oncology

## 2015-02-18 ENCOUNTER — Ambulatory Visit: Payer: BLUE CROSS/BLUE SHIELD

## 2015-02-21 ENCOUNTER — Other Ambulatory Visit: Payer: Self-pay | Admitting: Internal Medicine

## 2015-02-22 ENCOUNTER — Encounter: Payer: Self-pay | Admitting: Internal Medicine

## 2015-02-22 ENCOUNTER — Ambulatory Visit (INDEPENDENT_AMBULATORY_CARE_PROVIDER_SITE_OTHER): Payer: BLUE CROSS/BLUE SHIELD | Admitting: Internal Medicine

## 2015-02-22 VITALS — BP 124/68 | HR 72 | Temp 99.3°F | Wt 345.0 lb

## 2015-02-22 DIAGNOSIS — I10 Essential (primary) hypertension: Secondary | ICD-10-CM

## 2015-02-22 DIAGNOSIS — I5032 Chronic diastolic (congestive) heart failure: Secondary | ICD-10-CM

## 2015-02-22 DIAGNOSIS — I776 Arteritis, unspecified: Secondary | ICD-10-CM

## 2015-02-22 DIAGNOSIS — I872 Venous insufficiency (chronic) (peripheral): Secondary | ICD-10-CM | POA: Diagnosis not present

## 2015-02-22 DIAGNOSIS — L97929 Non-pressure chronic ulcer of unspecified part of left lower leg with unspecified severity: Secondary | ICD-10-CM

## 2015-02-22 DIAGNOSIS — L97919 Non-pressure chronic ulcer of unspecified part of right lower leg with unspecified severity: Secondary | ICD-10-CM

## 2015-02-22 DIAGNOSIS — I87313 Chronic venous hypertension (idiopathic) with ulcer of bilateral lower extremity: Secondary | ICD-10-CM

## 2015-02-22 DIAGNOSIS — C8230 Follicular lymphoma grade IIIa, unspecified site: Secondary | ICD-10-CM

## 2015-02-22 MED ORDER — CLONAZEPAM 0.5 MG PO TABS: ORAL_TABLET | ORAL | Status: DC

## 2015-02-22 MED ORDER — TRIAMCINOLONE ACETONIDE 0.1 % EX OINT: TOPICAL_OINTMENT | Freq: Three times a day (TID) | CUTANEOUS | Status: DC

## 2015-02-22 MED ORDER — PREDNISONE 10 MG PO TABS: ORAL_TABLET | ORAL | Status: DC

## 2015-02-22 MED ORDER — OXYCODONE-ACETAMINOPHEN 5-325 MG PO TABS: 1.0000 | ORAL_TABLET | Freq: Four times a day (QID) | ORAL | Status: DC | PRN

## 2015-02-22 NOTE — Assessment & Plan Note (Signed)
On chemo - on hold now

## 2015-02-22 NOTE — Progress Notes (Signed)
Subjective:  Patient ID: [name], male    DOB: 1955/08/16  Age: 59 y.o. MRN: [id number]  CC: No chief complaint on file.   HPI [name] presents for R leg shooting pains off and on x 1 week. F/u LE ulcers. The patient presents for a follow-up of R LE vasculitis vs cellulitis, OSA, CHF, chronic hypertension, chronic dyslipidemia, type 2 diabetes controlled with medicines       Outpatient Prescriptions Prior to Visit  Medication Sig Dispense Refill  . acyclovir (ZOVIRAX) 400 MG tablet Take 400 mg by mouth daily.  3  . albuterol (PROVENTIL) (2.5 MG/3ML) 0.083% nebulizer solution Take 3 mLs (2.5 mg total) by nebulization every 2 (two) hours as needed for wheezing. 75 mL 2  . allopurinol (ZYLOPRIM) 300 MG tablet Take 300 mg by mouth daily.  3  . ANORO ELLIPTA 62.5-25 MCG/INH AEPB as needed.    Marland Kitchen aspirin EC 81 MG tablet Take 81 mg by mouth daily.    . clopidogrel (PLAVIX) 75 MG tablet TAKE 1 TABLET BY MOUTH DAILY WITH BREAKFAST. 90 tablet 3  . hydrALAZINE (APRESOLINE) 50 MG tablet Take 1 tablet (50 mg total) by mouth every 8 (eight) hours. 90 tablet 0  . metoprolol succinate (TOPROL-XL) 50 MG 24 hr tablet TAKE 2 TABLETS EVERY DAY 180 tablet 3  . nitroGLYCERIN (NITROSTAT) 0.4 MG SL tablet Place 1 tablet (0.4 mg total) under the tongue every 5 (five) minutes as needed for chest pain (Up to 3 doses). 25 tablet 4  . potassium chloride SA (KLOR-CON M20) 20 MEQ tablet TAKE 1 TABLET BY MOUTH DAILY 90 tablet 3  . simvastatin (ZOCOR) 20 MG tablet TAKE 1 TABLET (20 MG TOTAL) BY MOUTH AT BEDTIME. 90 tablet 3  . sulfamethoxazole-trimethoprim (BACTRIM DS) 800-160 MG per tablet Take 1 tablet by mouth 2 (two) times daily. 14 tablet 0  . torsemide (DEMADEX) 100 MG tablet Take 100 mg by mouth daily.    Marland Kitchen tretinoin (RETIN-A) 0.025 % cream Apply 1 application topically as needed.     . clonazePAM (KLONOPIN) 0.5 MG tablet TAKE 1 TABLET BY MOUTH TWICE A DAY AS NEEDED FOR ANXIETY or at hs for  insomnia 180 tablet 0  . oxyCODONE-acetaminophen (PERCOCET) 5-325 MG per tablet 1-2 tabs po q6 hours prn pain 30 tablet 0  . oxyCODONE-acetaminophen (PERCOCET/ROXICET) 5-325 MG per tablet Take 1 tablet by mouth every 6 (six) hours as needed for moderate pain. 120 tablet 0  . predniSONE (DELTASONE) 10 MG tablet Take 4 tablets (40 mg) daily for one week, then, Take 3 tablets (30 mg) daily for one week,then, Take 2 tablets (20 mg) daily for one week, then, Take 1 tablet   (10 mg) daily for one week and then stop 70 tablet 0  . triamcinolone ointment (KENALOG) 0.1 % Apply topically 3 (three) times daily. 30 g 0   No facility-administered medications prior to visit.    ROS Review of Systems  Constitutional: Negative for fever, appetite change, fatigue and unexpected weight change.  HENT: Negative for congestion, nosebleeds, sneezing, sore throat and trouble swallowing.   Eyes: Negative for itching and visual disturbance.  Respiratory: Positive for shortness of breath and wheezing. Negative for cough.   Cardiovascular: Positive for leg swelling. Negative for chest pain and palpitations.  Gastrointestinal: Negative for nausea, diarrhea, blood in stool and abdominal distention.  Genitourinary: Negative for frequency, hematuria and decreased urine volume.  Musculoskeletal: Positive for arthralgias and gait problem. Negative for back  pain, joint swelling and neck pain.  Skin: Positive for rash and wound.  Neurological: Negative for dizziness, tremors, speech difficulty and weakness.  Psychiatric/Behavioral: Negative for sleep disturbance, dysphoric mood and agitation. The patient is not nervous/anxious.     Objective:  BP 124/68 mmHg  Pulse 72  Temp(Src) 99.3 F (37.4 C) (Oral)  Wt 345 lb (156.491 kg)  SpO2 94%  BP Readings from Last 3 Encounters:  02/22/15 124/68  02/11/15 143/63  02/11/15 118/58    Wt Readings from Last 3 Encounters:  02/22/15 345 lb (156.491 kg)  02/11/15 347 lb 2  oz (157.455 kg)  02/11/15 348 lb (157.852 kg)    Physical Exam  Constitutional: He is oriented to person, place, and time. He appears well-developed. No distress.  NAD  HENT:  Mouth/Throat: Oropharynx is clear and moist.  Eyes: Conjunctivae are normal. Pupils are equal, round, and reactive to light.  Neck: Normal range of motion. No JVD present. No thyromegaly present.  Cardiovascular: Normal rate, regular rhythm, normal heart sounds and intact distal pulses.  Exam reveals no gallop and no friction rub.   No murmur heard. Pulmonary/Chest: Effort normal and breath sounds normal. No respiratory distress. He has no wheezes. He has no rales. He exhibits no tenderness.  Abdominal: Soft. Bowel sounds are normal. He exhibits no distension and no mass. There is no tenderness. There is no rebound and no guarding.  Musculoskeletal: Normal range of motion. He exhibits edema and tenderness.  Lymphadenopathy:    He has no cervical adenopathy.  Neurological: He is alert and oriented to person, place, and time. He has normal reflexes. No cranial nerve deficit. He exhibits normal muscle tone. He displays a negative Romberg sign. Coordination and gait normal.  Skin: Skin is warm and dry. No rash noted. There is erythema.  Psychiatric: He has a normal mood and affect. His behavior is normal. Judgment and thought content normal.  R lower distal 1/3 ankle long ulcer across Edema B 1-2+ Purple/brown discoloration  Lab Results  Component Value Date   WBC 12.4* 01/28/2015   HGB 13.8 01/28/2015   HCT 42.2 01/28/2015   PLT 157 01/28/2015   GLUCOSE 177* 01/28/2015   CHOL 131 03/25/2014   TRIG 185.0* 03/25/2014   HDL 24.00* 03/25/2014   LDLCALC 70 03/25/2014   ALT 33 01/28/2015   AST 16 01/28/2015   NA 142 01/28/2015   K 4.0 01/28/2015   CL 99 01/27/2015   CREATININE 0.8 01/28/2015   BUN 20.5 01/28/2015   CO2 34* 01/28/2015   TSH 2.01 02/25/2013   PSA 0.23 09/15/2011   INR 1.00 12/28/2014    HGBA1C 6.5 01/27/2015    No results found.  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 -     predniSONE (DELTASONE) 10 MG tablet; Take 1 tablet   (10 mg)  daily -     triamcinolone ointment (KENALOG) 0.1 %; Apply topically 3 (three) times daily.  I have changed Mr. Vanessen predniSONE. I am also having him maintain his aspirin EC, nitroGLYCERIN, clopidogrel, metoprolol succinate, potassium chloride SA, simvastatin, torsemide, albuterol, hydrALAZINE, tretinoin, ANORO ELLIPTA, allopurinol, acyclovir, sulfamethoxazole-trimethoprim, clonazePAM, oxyCODONE-acetaminophen, and triamcinolone ointment.  Meds ordered this encounter  Medications  . clonazePAM (KLONOPIN) 0.5 MG tablet    Sig: TAKE 1 TABLET BY MOUTH TWICE A DAY AS NEEDED FOR ANXIETY or at hs for insomnia    Dispense:  180 tablet    Refill:  0  . oxyCODONE-acetaminophen (PERCOCET/ROXICET) 5-325 MG per tablet    Sig: Take 1 tablet by mouth every 6 (six) hours as needed for moderate pain.    Dispense:  120 tablet    Refill:  0  . predniSONE (DELTASONE) 10 MG tablet    Sig: Take 1 tablet   (10 mg) daily    Dispense:  30 tablet    Refill:  0  . triamcinolone ointment (KENALOG) 0.1 %    Sig: Apply topically 3 (three) times daily.    Dispense:  90 g    Refill:  0     Follow-up: Return in about 2 weeks (around 03/08/2015) for  a follow-up visit.  Walker Kehr, MD

## 2015-02-22 NOTE — Assessment & Plan Note (Signed)
On Torsemide, Hydralazine Refusing CPAP

## 2015-02-22 NOTE — Assessment & Plan Note (Signed)
Severe B R>L Risks associated with treatment noncompliance were discussed. Compliance was encouraged.

## 2015-02-22 NOTE — Assessment & Plan Note (Signed)
2016R>>L Wound clinic Risks associated with treatment noncompliance were discussed. Compliance was encouraged.

## 2015-02-22 NOTE — Assessment & Plan Note (Signed)
Torsemide 

## 2015-02-22 NOTE — Progress Notes (Signed)
Pre visit review using our clinic review tool, if applicable. No additional management support is needed unless otherwise documented below in the visit note. 

## 2015-02-22 NOTE — Patient Instructions (Addendum)
Dress wound w/TELFA pad 8x3"

## 2015-02-22 NOTE — Assessment & Plan Note (Signed)
6/16 B LEs Pt was on Prednisone - ran out in 7/16 Risks associated with treatment noncompliance were discussed. Compliance was encouraged.

## 2015-02-24 ENCOUNTER — Telehealth: Payer: Self-pay | Admitting: *Deleted

## 2015-02-24 NOTE — Telephone Encounter (Signed)
Patient called reporting his "left middle finger is still open and has to heal from the inside out per Dr. Fredna Dow.  I'm going to have to cancel again.  Return number [phone]."  Denies fever and reports gash is from knuckle to knuckle, 1 1/2" long by 3/4 " wide.

## 2015-02-25 ENCOUNTER — Telehealth: Payer: Self-pay | Admitting: *Deleted

## 2015-02-25 ENCOUNTER — Telehealth: Payer: Self-pay | Admitting: Hematology and Oncology

## 2015-02-25 ENCOUNTER — Other Ambulatory Visit: Payer: Self-pay | Admitting: *Deleted

## 2015-02-25 NOTE — Telephone Encounter (Signed)
Pt already contacted by scheduler regarding new appts.

## 2015-02-25 NOTE — Telephone Encounter (Signed)
I have sent request to scheduler to reschedule

## 2015-02-25 NOTE — Telephone Encounter (Signed)
lvm fo rpt regarding to July 15 appts cx and moved to 7.27.Marland KitchenMarland KitchenMarland Kitchen

## 2015-02-26 ENCOUNTER — Telehealth: Payer: Self-pay | Admitting: Hematology and Oncology

## 2015-02-26 ENCOUNTER — Other Ambulatory Visit: Payer: BLUE CROSS/BLUE SHIELD

## 2015-02-26 ENCOUNTER — Ambulatory Visit: Payer: BLUE CROSS/BLUE SHIELD

## 2015-02-26 ENCOUNTER — Ambulatory Visit: Payer: BLUE CROSS/BLUE SHIELD | Admitting: Hematology and Oncology

## 2015-02-26 NOTE — Telephone Encounter (Signed)
returned pt call and r/s appt per MD staff...pt finger still not healed...pt ok anda ware of new d.t

## 2015-03-03 ENCOUNTER — Other Ambulatory Visit: Payer: Self-pay | Admitting: Internal Medicine

## 2015-03-04 ENCOUNTER — Other Ambulatory Visit: Payer: Self-pay

## 2015-03-04 MED ORDER — TRIAMCINOLONE ACETONIDE 0.1 % EX OINT: TOPICAL_OINTMENT | Freq: Three times a day (TID) | CUTANEOUS | Status: DC

## 2015-03-08 ENCOUNTER — Ambulatory Visit: Payer: BLUE CROSS/BLUE SHIELD | Admitting: Internal Medicine

## 2015-03-08 DIAGNOSIS — Z0289 Encounter for other administrative examinations: Secondary | ICD-10-CM

## 2015-03-10 ENCOUNTER — Ambulatory Visit: Payer: BLUE CROSS/BLUE SHIELD | Admitting: Hematology and Oncology

## 2015-03-10 ENCOUNTER — Ambulatory Visit: Payer: BLUE CROSS/BLUE SHIELD

## 2015-03-10 ENCOUNTER — Other Ambulatory Visit: Payer: BLUE CROSS/BLUE SHIELD

## 2015-03-13 ENCOUNTER — Other Ambulatory Visit: Payer: Self-pay | Admitting: Internal Medicine

## 2015-03-14 ENCOUNTER — Other Ambulatory Visit: Payer: Self-pay | Admitting: Internal Medicine

## 2015-03-18 ENCOUNTER — Telehealth: Payer: Self-pay | Admitting: Hematology and Oncology

## 2015-03-18 ENCOUNTER — Other Ambulatory Visit (HOSPITAL_BASED_OUTPATIENT_CLINIC_OR_DEPARTMENT_OTHER): Payer: BLUE CROSS/BLUE SHIELD

## 2015-03-18 ENCOUNTER — Ambulatory Visit (HOSPITAL_BASED_OUTPATIENT_CLINIC_OR_DEPARTMENT_OTHER): Payer: BLUE CROSS/BLUE SHIELD | Admitting: Hematology and Oncology

## 2015-03-18 ENCOUNTER — Ambulatory Visit (HOSPITAL_BASED_OUTPATIENT_CLINIC_OR_DEPARTMENT_OTHER): Payer: BLUE CROSS/BLUE SHIELD

## 2015-03-18 ENCOUNTER — Ambulatory Visit: Payer: BLUE CROSS/BLUE SHIELD

## 2015-03-18 ENCOUNTER — Other Ambulatory Visit: Payer: Self-pay | Admitting: Nurse Practitioner

## 2015-03-18 ENCOUNTER — Encounter: Payer: Self-pay | Admitting: Hematology and Oncology

## 2015-03-18 VITALS — BP 141/65 | HR 83 | Temp 98.7°F | Resp 19 | Ht 72.0 in | Wt 358.8 lb

## 2015-03-18 VITALS — BP 145/55 | HR 89 | Temp 98.3°F | Resp 20

## 2015-03-18 DIAGNOSIS — Z5111 Encounter for antineoplastic chemotherapy: Secondary | ICD-10-CM

## 2015-03-18 DIAGNOSIS — C8220 Follicular lymphoma grade III, unspecified, unspecified site: Secondary | ICD-10-CM

## 2015-03-18 DIAGNOSIS — C8230 Follicular lymphoma grade IIIa, unspecified site: Secondary | ICD-10-CM

## 2015-03-18 DIAGNOSIS — J449 Chronic obstructive pulmonary disease, unspecified: Secondary | ICD-10-CM

## 2015-03-18 DIAGNOSIS — L03115 Cellulitis of right lower limb: Secondary | ICD-10-CM

## 2015-03-18 DIAGNOSIS — Z5112 Encounter for antineoplastic immunotherapy: Secondary | ICD-10-CM

## 2015-03-18 DIAGNOSIS — I509 Heart failure, unspecified: Secondary | ICD-10-CM | POA: Diagnosis not present

## 2015-03-18 DIAGNOSIS — R0600 Dyspnea, unspecified: Secondary | ICD-10-CM

## 2015-03-18 DIAGNOSIS — R600 Localized edema: Secondary | ICD-10-CM

## 2015-03-18 DIAGNOSIS — Z95828 Presence of other vascular implants and grafts: Secondary | ICD-10-CM

## 2015-03-18 LAB — CBC WITH DIFFERENTIAL/PLATELET
BASO%: 0.9 % (ref 0.0–2.0)
Basophils Absolute: 0.1 10*3/uL (ref 0.0–0.1)
EOS%: 1.7 % (ref 0.0–7.0)
Eosinophils Absolute: 0.2 10*3/uL (ref 0.0–0.5)
HCT: 41.5 % (ref 38.4–49.9)
HEMOGLOBIN: 13.5 g/dL (ref 13.0–17.1)
LYMPH#: 0.8 10*3/uL — AB (ref 0.9–3.3)
LYMPH%: 7.1 % — AB (ref 14.0–49.0)
MCH: 32.6 pg (ref 27.2–33.4)
MCHC: 32.5 g/dL (ref 32.0–36.0)
MCV: 100.2 fL — ABNORMAL HIGH (ref 79.3–98.0)
MONO#: 1.3 10*3/uL — ABNORMAL HIGH (ref 0.1–0.9)
MONO%: 11.2 % (ref 0.0–14.0)
NEUT%: 79.1 % — AB (ref 39.0–75.0)
NEUTROS ABS: 9 10*3/uL — AB (ref 1.5–6.5)
Platelets: 136 10*3/uL — ABNORMAL LOW (ref 140–400)
RBC: 4.14 10*6/uL — AB (ref 4.20–5.82)
RDW: 15.6 % — ABNORMAL HIGH (ref 11.0–14.6)
WBC: 11.3 10*3/uL — ABNORMAL HIGH (ref 4.0–10.3)

## 2015-03-18 LAB — COMPREHENSIVE METABOLIC PANEL (CC13)
ALBUMIN: 3.7 g/dL (ref 3.5–5.0)
ALT: 20 U/L (ref 0–55)
AST: 16 U/L (ref 5–34)
Alkaline Phosphatase: 73 U/L (ref 40–150)
Anion Gap: 6 mEq/L (ref 3–11)
BUN: 12.3 mg/dL (ref 7.0–26.0)
CO2: 31 meq/L — AB (ref 22–29)
Calcium: 9.3 mg/dL (ref 8.4–10.4)
Chloride: 103 mEq/L (ref 98–109)
Creatinine: 0.8 mg/dL (ref 0.7–1.3)
EGFR: >90 mL/min/{1.73_m2} (ref >90)
Glucose: 109 mg/dl (ref 70–140)
POTASSIUM: 4.1 meq/L (ref 3.5–5.1)
SODIUM: 140 meq/L (ref 136–145)
Total Bilirubin: 0.29 mg/dL (ref 0.20–1.20)
Total Protein: 6.8 g/dL (ref 6.4–8.3)

## 2015-03-18 LAB — LACTATE DEHYDROGENASE (CC13): LDH: 165 U/L (ref 125–245)

## 2015-03-18 MED ORDER — SODIUM CHLORIDE 0.9 % IJ SOLN
10.0000 mL | INTRAMUSCULAR | Status: DC | PRN
  Administered 2015-03-18: 10 mL via INTRAVENOUS
  Filled 2015-03-18: qty 10

## 2015-03-18 MED ORDER — SODIUM CHLORIDE 0.9 % IV SOLN
2.0000 mg | Freq: Once | INTRAVENOUS | Status: AC
  Administered 2015-03-18: 2 mg via INTRAVENOUS
  Filled 2015-03-18: qty 2

## 2015-03-18 MED ORDER — SODIUM CHLORIDE 0.9 % IV SOLN
375.0000 mg/m2 | Freq: Once | INTRAVENOUS | Status: AC
  Administered 2015-03-18: 1100 mg via INTRAVENOUS
  Filled 2015-03-18: qty 110

## 2015-03-18 MED ORDER — ALBUTEROL SULFATE (2.5 MG/3ML) 0.083% IN NEBU
2.5000 mg | INHALATION_SOLUTION | Freq: Once | RESPIRATORY_TRACT | Status: AC
  Administered 2015-03-18: 2.5 mg via RESPIRATORY_TRACT
  Filled 2015-03-18: qty 3

## 2015-03-18 MED ORDER — ALBUTEROL SULFATE (2.5 MG/3ML) 0.083% IN NEBU
2.5000 mg | INHALATION_SOLUTION | Freq: Once | RESPIRATORY_TRACT | Status: AC
Start: 2015-03-18 — End: 2015-03-18
  Administered 2015-03-18: 2.5 mg via RESPIRATORY_TRACT
  Filled 2015-03-18: qty 3

## 2015-03-18 MED ORDER — SODIUM CHLORIDE 0.9 % IV SOLN
Freq: Once | INTRAVENOUS | Status: AC
  Administered 2015-03-18: 11:00:00 via INTRAVENOUS
  Filled 2015-03-18: qty 8

## 2015-03-18 MED ORDER — ACETAMINOPHEN 325 MG PO TABS
650.0000 mg | ORAL_TABLET | Freq: Once | ORAL | Status: AC
  Administered 2015-03-18: 650 mg via ORAL

## 2015-03-18 MED ORDER — PREDNISONE 20 MG PO TABS: 20.0000 mg | ORAL_TABLET | Freq: Every day | ORAL | Status: DC

## 2015-03-18 MED ORDER — ALBUTEROL SULFATE (2.5 MG/3ML) 0.083% IN NEBU
INHALATION_SOLUTION | RESPIRATORY_TRACT | Status: AC
  Filled 2015-03-18: qty 6

## 2015-03-18 MED ORDER — SODIUM CHLORIDE 0.9 % IJ SOLN
10.0000 mL | INTRAMUSCULAR | Status: DC | PRN
  Administered 2015-03-18: 10 mL
  Filled 2015-03-18: qty 10

## 2015-03-18 MED ORDER — PREDNISONE 20 MG PO TABS: ORAL_TABLET | ORAL | Status: DC

## 2015-03-18 MED ORDER — DIPHENHYDRAMINE HCL 25 MG PO CAPS
ORAL_CAPSULE | ORAL | Status: AC
  Filled 2015-03-18: qty 2

## 2015-03-18 MED ORDER — ACETAMINOPHEN 325 MG PO TABS
ORAL_TABLET | ORAL | Status: AC
  Filled 2015-03-18: qty 2

## 2015-03-18 MED ORDER — SODIUM CHLORIDE 0.9 % IV SOLN
800.0000 mg/m2 | Freq: Once | INTRAVENOUS | Status: AC
  Administered 2015-03-18: 2240 mg via INTRAVENOUS
  Filled 2015-03-18: qty 112

## 2015-03-18 MED ORDER — DIPHENHYDRAMINE HCL 25 MG PO CAPS
50.0000 mg | ORAL_CAPSULE | Freq: Once | ORAL | Status: AC
  Administered 2015-03-18: 50 mg via ORAL

## 2015-03-18 MED ORDER — ANTICOAGULANT SODIUM CITRATE 4% (200MG/5ML) IV SOLN
5.0000 mL | Freq: Once | Status: AC | PRN
  Administered 2015-03-18: 5 mL via INTRAVENOUS
  Filled 2015-03-18 (×2): qty 5

## 2015-03-18 MED ORDER — SODIUM CHLORIDE 0.9 % IV SOLN
Freq: Once | INTRAVENOUS | Status: AC
  Administered 2015-03-18: 10:00:00 via INTRAVENOUS

## 2015-03-18 NOTE — Progress Notes (Signed)
[place] OFFICE PROGRESS NOTE  Patient Care Team: [name], MD as PCP - General (Internal Medicine) [name], MD as Consulting Physician (Cardiology) Heath Lark, MD as Consulting Physician (Hematology and Oncology) Jerrell Belfast, MD as Consulting Physician (Otolaryngology) Daryll Brod, MD as Consulting Physician (Orthopedic Surgery)  SUMMARY OF ONCOLOGIC HISTORY: Oncology History   FLIPI score of 2; stage 3 and areas of involvement >4     Follicular lymphoma grade 3a   06/24/2009 Imaging PET CT scan showed two small FDG positive left neck nodes   12/11/2014 Surgery He underwent excisional lymph node biopsy of the left supraclavicular lymph node/neck region   12/11/2014 Pathology Results Accession: [id number] biopsy show follicular lymphoma   11/07/7122 Imaging ECHO showed LVH but preserved EF   12/25/2014 Imaging PET scan showed disease above and below diaphragm   12/28/2014 Procedure He has port placement   12/31/2014 - 01/01/2015 Chemotherapy He received 1 cycle of bendamustine with rituximab, discontinued due to suspicion of allergic reaction to bendamustine   01/12/2015 - 01/19/2015 Hospital Admission He was admitted to the hospital with suspicious allergic reaction, significant bilateral lower extremity edema with cellulitis, pneumonia and mild fluid overload   03/18/2015 -  Chemotherapy Treatment is switched to rituximab, Cytoxan, vincristine and prednisone    INTERVAL HISTORY: Please see below for problem oriented charting. He returns for further follow-up. His leg wound is slowly healing. He denies new cellulitis. Denies any chest pain or shortness of breath. He has very mild nonproductive cough which is chronic related to COPD. No new lymphadenopathy  REVIEW OF SYSTEMS:   Constitutional: Denies fevers, chills or abnormal weight loss Eyes: Denies blurriness of vision Ears, nose, mouth, throat, and face: Denies mucositis or sore  throat Respiratory: Denies cough, dyspnea or wheezes Cardiovascular: Denies palpitation, chest discomfort Gastrointestinal:  Denies nausea, heartburn or change in bowel habits Lymphatics: Denies new lymphadenopathy or easy bruising Neurological:Denies numbness, tingling or new weaknesses Behavioral/Psych: Mood is stable, no new changes  All other systems were reviewed with the patient and are negative.  I have reviewed the past medical history, past surgical history, social history and family history with the patient and they are unchanged from previous note.  ALLERGIES:  is allergic to bendamustine hcl; heparin; and tape.  MEDICATIONS:  Current Outpatient Prescriptions  Medication Sig Dispense Refill  . acyclovir (ZOVIRAX) 400 MG tablet Take 400 mg by mouth daily.  3  . albuterol (PROVENTIL) (2.5 MG/3ML) 0.083% nebulizer solution Take 3 mLs (2.5 mg total) by nebulization every 2 (two) hours as needed for wheezing. 75 mL 2  . allopurinol (ZYLOPRIM) 300 MG tablet Take 300 mg by mouth daily.  3  . ANORO ELLIPTA 62.5-25 MCG/INH AEPB as needed.    Marland Kitchen aspirin EC 81 MG tablet Take 81 mg by mouth daily.    . clonazePAM (KLONOPIN) 0.5 MG tablet TAKE 1 TABLET BY MOUTH TWICE A DAY AS NEEDED FOR ANXIETY or at hs for insomnia 180 tablet 0  . clopidogrel (PLAVIX) 75 MG tablet TAKE 1 TABLET BY MOUTH DAILY WITH BREAKFAST. 90 tablet 3  . hydrALAZINE (APRESOLINE) 50 MG tablet Take 1 tablet (50 mg total) by mouth every 8 (eight) hours. 90 tablet 0  . metoprolol succinate (TOPROL-XL) 50 MG 24 hr tablet TAKE 2 TABLETS EVERY DAY 180 tablet 3  . nitroGLYCERIN (NITROSTAT) 0.4 MG SL tablet Place 1 tablet (0.4 mg total) under the tongue every 5 (five) minutes as needed for chest pain (Up to  3 doses). 25 tablet 4  . oxyCODONE-acetaminophen (PERCOCET/ROXICET) 5-325 MG per tablet Take 1 tablet by mouth every 6 (six) hours as needed for moderate pain. 120 tablet 0  . potassium chloride SA (KLOR-CON M20) 20 MEQ tablet  TAKE 1 TABLET BY MOUTH DAILY 90 tablet 3  . predniSONE (DELTASONE) 10 MG tablet Take 1 tablet   (10 mg) daily 30 tablet 0  . simvastatin (ZOCOR) 20 MG tablet TAKE 1 TABLET (20 MG TOTAL) BY MOUTH AT BEDTIME. 90 tablet 3  . sulfamethoxazole-trimethoprim (BACTRIM DS) 800-160 MG per tablet Take 1 tablet by mouth 2 (two) times daily. 14 tablet 0  . torsemide (DEMADEX) 100 MG tablet Take 100 mg by mouth daily.    Marland Kitchen tretinoin (RETIN-A) 0.025 % cream Apply 1 application topically as needed.     . triamcinolone ointment (KENALOG) 0.1 % Apply topically 3 (three) times daily. 90 g 0   No current facility-administered medications for this visit.   Facility-Administered Medications Ordered in Other Visits  Medication Dose Route Frequency Provider Last Rate Last Dose  . 0.9 %  sodium chloride infusion   Intravenous Once Heath Lark, MD      . acetaminophen (TYLENOL) tablet 650 mg  650 mg Oral Once Heath Lark, MD      . anticoagulant sodium citrate solution 5 mL  5 mL Intravenous Once PRN Heath Lark, MD      . cyclophosphamide (CYTOXAN) 2,240 mg in sodium chloride 0.9 % 500 mL chemo infusion  800 mg/m2 (Treatment Plan Actual) Intravenous Once Heath Lark, MD      . diphenhydrAMINE (BENADRYL) capsule 50 mg  50 mg Oral Once Heath Lark, MD      . ondansetron (ZOFRAN) 16 mg, dexamethasone (DECADRON) 20 mg in sodium chloride 0.9 % 50 mL IVPB   Intravenous Once Heath Lark, MD      . riTUXimab (RITUXAN) 1,100 mg in sodium chloride 0.9 % 250 mL (3.0556 mg/mL) chemo infusion  375 mg/m2 (Treatment Plan Actual) Intravenous Once Heath Lark, MD      . sodium chloride 0.9 % injection 10 mL  10 mL Intracatheter PRN Heath Lark, MD      . vinCRIStine (ONCOVIN) 2 mg in sodium chloride 0.9 % 50 mL chemo infusion  2 mg Intravenous Once Heath Lark, MD        PHYSICAL EXAMINATION: ECOG PERFORMANCE STATUS: 1 - Symptomatic but completely ambulatory  Filed Vitals:   03/18/15 0922  BP: 141/65  Pulse: 83  Temp: 98.7 F (37.1 C)   Resp: 19   Filed Weights   03/18/15 0922  Weight: 358 lb 12.8 oz (162.751 kg)    GENERAL:alert, no distress and comfortable SKIN: skin color, texture, turgor are normal, no rashes or significant lesions. His right lower extremity wound is bandaged EYES: normal, Conjunctiva are pink and non-injected, sclera clear OROPHARYNX:no exudate, no erythema and lips, buccal mucosa, and tongue normal  NECK: supple, thyroid normal size, non-tender, without nodularity LYMPH:  no palpable lymphadenopathy in the cervical, axillary or inguinal LUNGS: clear to auscultation and percussion with normal breathing effort HEART: regular rate & rhythm and no murmurs with moderate bilateral lower extremity edema, unchanged compared to previous exam ABDOMEN:abdomen soft, non-tender and normal bowel sounds Musculoskeletal:no cyanosis of digits and no clubbing  NEURO: alert & oriented x 3 with fluent speech, no focal motor/sensory deficits  LABORATORY DATA:  I have reviewed the data as listed    Component Value Date/Time   NA 140 03/18/2015 0854  NA 139 01/27/2015 1524   K 4.1 03/18/2015 0854   K 3.7 01/27/2015 1524   CL 99 01/27/2015 1524   CO2 31* 03/18/2015 0854   CO2 36* 01/27/2015 1524   GLUCOSE 109 03/18/2015 0854   GLUCOSE 99 01/27/2015 1524   BUN 12.3 03/18/2015 0854   BUN 25* 01/27/2015 1524   CREATININE 0.8 03/18/2015 0854   CREATININE 1.05 01/27/2015 1524   CALCIUM 9.3 03/18/2015 0854   CALCIUM 9.2 01/27/2015 1524   PROT 6.8 03/18/2015 0854   PROT 6.6 01/15/2015 0500   ALBUMIN 3.7 03/18/2015 0854   ALBUMIN 3.1* 01/15/2015 0500   AST 16 03/18/2015 0854   AST 22 01/15/2015 0500   ALT 20 03/18/2015 0854   ALT 27 01/15/2015 0500   ALKPHOS 73 03/18/2015 0854   ALKPHOS 57 01/15/2015 0500   BILITOT 0.29 03/18/2015 0854   BILITOT 0.6 01/15/2015 0500   GFRNONAA >60 01/18/2015 0320   GFRAA >60 01/18/2015 0320    No results found for: SPEP, UPEP  Lab Results  Component Value Date    WBC 11.3* 03/18/2015   NEUTROABS 9.0* 03/18/2015   HGB 13.5 03/18/2015   HCT 41.5 03/18/2015   MCV 100.2* 03/18/2015   PLT 136* 03/18/2015      Chemistry      Component Value Date/Time   NA 140 03/18/2015 0854   NA 139 01/27/2015 1524   K 4.1 03/18/2015 0854   K 3.7 01/27/2015 1524   CL 99 01/27/2015 1524   CO2 31* 03/18/2015 0854   CO2 36* 01/27/2015 1524   BUN 12.3 03/18/2015 0854   BUN 25* 01/27/2015 1524   CREATININE 0.8 03/18/2015 0854   CREATININE 1.05 01/27/2015 1524      Component Value Date/Time   CALCIUM 9.3 03/18/2015 0854   CALCIUM 9.2 01/27/2015 1524   ALKPHOS 73 03/18/2015 0854   ALKPHOS 57 01/15/2015 0500   AST 16 03/18/2015 0854   AST 22 01/15/2015 0500   ALT 20 03/18/2015 0854   ALT 27 01/15/2015 0500   BILITOT 0.29 03/18/2015 0854   BILITOT 0.6 01/15/2015 0500      ASSESSMENT & PLAN:  Follicular lymphoma grade 3a His bilateral leg cellulitis has improved although not completely resolved. The patient has not received any chemotherapy for over 2 months. Due to prior potential allergic reaction to bendamustine, we are switching his treatment to rituximab, Cytoxan, vincristine along with prednisone. The patient takes chronic prednisone therapy for his skin condition. For this reason, I would not give him pulse prednisone after each cycle. I will see him back in 3 weeks prior to cycle 3 of therapy. I plan to restage with PET CT scan after cycle 3 of treatment  Cellulitis of right lower extremity  His lower extremity wound in the left leg has improved. On the right leg, unfortunately, there is still persistent, slow healing wound. He will continue prednisone taper as directed by his dermatologist.    Bilateral lower extremity edema This is related to morbid obesity and chronic congestive heart failure. He will continue diuretic therapy as directed.  The risk, benefit, side effects of RCVP treatment was fully discussed with the patient and he  agreed to proceed  Orders Placed This Encounter  Procedures  . Comprehensive metabolic panel    Standing Status: Future     Number of Occurrences: 1     Standing Expiration Date: 04/21/2016  . CBC with Differential/Platelet    Standing Status: Future     Number  of Occurrences: 1     Standing Expiration Date: 04/21/2016  . Lactate dehydrogenase    Standing Status: Future     Number of Occurrences: 1     Standing Expiration Date: 04/21/2016  . Comprehensive metabolic panel    Standing Status: Future     Number of Occurrences:      Standing Expiration Date: 04/21/2016  . CBC with Differential/Platelet    Standing Status: Future     Number of Occurrences:      Standing Expiration Date: 04/21/2016   All questions were answered. The patient knows to call the clinic with any problems, questions or concerns. No barriers to learning was detected. I spent 30 minutes counseling the patient face to face. The total time spent in the appointment was 40 minutes and more than 50% was on counseling and review of test results     [facility], Kasey Ewings, MD 03/18/2015 10:17 AM

## 2015-03-18 NOTE — Assessment & Plan Note (Signed)
His bilateral leg cellulitis has improved although not completely resolved. The patient has not received any chemotherapy for over 2 months. Due to prior potential allergic reaction to bendamustine, we are switching his treatment to rituximab, Cytoxan, vincristine along with prednisone. The patient takes chronic prednisone therapy for his skin condition. For this reason, I would not give him pulse prednisone after each cycle. I will see him back in 3 weeks prior to cycle 3 of therapy. I plan to restage with PET CT scan after cycle 3 of treatment

## 2015-03-18 NOTE — Assessment & Plan Note (Signed)
His lower extremity wound in the left leg has improved. On the right leg, unfortunately, there is still persistent, slow healing wound. He will continue prednisone taper as directed by his dermatologist.

## 2015-03-18 NOTE — Telephone Encounter (Signed)
Lft msg for pt confirming labs/ov per 08/04 POF, mailed schedule to pt... KJ

## 2015-03-18 NOTE — Patient Instructions (Signed)
[facility] Discharge Instructions for Patients Receiving Chemotherapy  Today you received the following chemotherapy agents:  Vincristine, cytoxan, rituxan  To help prevent nausea and vomiting after your treatment, we encourage you to take your nausea medication.  Take it as often as prescribed.     If you develop nausea and vomiting that is not controlled by your nausea medication, call the clinic. If it is after clinic hours your family physician or the after hours number for the clinic or go to the Emergency Department.   BELOW ARE SYMPTOMS THAT SHOULD BE REPORTED IMMEDIATELY:  *FEVER GREATER THAN 100.5 F  *CHILLS WITH OR WITHOUT FEVER  NAUSEA AND VOMITING THAT IS NOT CONTROLLED WITH YOUR NAUSEA MEDICATION  *UNUSUAL SHORTNESS OF BREATH  *UNUSUAL BRUISING OR BLEEDING  TENDERNESS IN MOUTH AND THROAT WITH OR WITHOUT PRESENCE OF ULCERS  *URINARY PROBLEMS  *BOWEL PROBLEMS  UNUSUAL RASH Items with * indicate a potential emergency and should be followed up as soon as possible.  One of the nurses will contact you 24 hours after your treatment. Please let the nurse know about any problems that you may have experienced. Feel free to call the clinic you have any questions or concerns. The clinic phone number is [phone].   I have been informed and understand all the instructions given to me. I know to contact the clinic, my physician, or go to the Emergency Department if any problems should occur. I do not have any questions at this time, but understand that I may call the clinic during office hours   should I have any questions or need assistance in obtaining follow up care.    __________________________________________  _____________  __________ Signature of Patient or Authorized Representative            Date                   Time    __________________________________________ Nurse's Signature   Rituximab injection (Rituxan) What is this  medicine? RITUXIMAB (ri TUX i mab) is a monoclonal antibody. This medicine changes the way the body's immune system works. It is used commonly to treat non-Hodgkin's lymphoma and other conditions. In cancer cells, this drug targets a specific protein within cancer cells and stops the cancer cells from growing. It is also used to treat rhuematoid arthritis (RA). In RA, this medicine slow the inflammatory process and help reduce joint pain and swelling. This medicine is often used with other cancer or arthritis medications. This medicine may be used for other purposes; ask your health care provider or pharmacist if you have questions. COMMON BRAND NAME(S): Rituxan What should I tell my health care provider before I take this medicine? They need to know if you have any of these conditions: -blood disorders -heart disease -history of hepatitis B -infection (especially a virus infection such as chickenpox, cold sores, or herpes) -irregular heartbeat -kidney disease -lung or breathing disease, like asthma -lupus -an unusual or allergic reaction to rituximab, mouse proteins, other medicines, foods, dyes, or preservatives -pregnant or trying to get pregnant -breast-feeding How should I use this medicine? This medicine is for infusion into a vein. It is administered in a hospital or clinic by a specially trained health care professional. A special MedGuide will be given to you by the pharmacist with each prescription and refill. Be sure to read this information carefully each time. Talk to your pediatrician regarding the use of this medicine in children. This medicine is not  approved for use in children. Overdosage: If you think you have taken too much of this medicine contact a poison control center or emergency room at once. NOTE: This medicine is only for you. Do not share this medicine with others. What if I miss a dose? It is important not to miss a dose. Call your doctor or health care  professional if you are unable to keep an appointment. What may interact with this medicine? -cisplatin -medicines for blood pressure -some other medicines for arthritis -vaccines This list may not describe all possible interactions. Give your health care provider a list of all the medicines, herbs, non-prescription drugs, or dietary supplements you use. Also tell them if you smoke, drink alcohol, or use illegal drugs. Some items may interact with your medicine. What should I watch for while using this medicine? Report any side effects that you notice during your treatment right away, such as changes in your breathing, fever, chills, dizziness or lightheadedness. These effects are more common with the first dose. Visit your prescriber or health care professional for checks on your progress. You will need to have regular blood work. Report any other side effects. The side effects of this medicine can continue after you finish your treatment. Continue your course of treatment even though you feel ill unless your doctor tells you to stop. Call your doctor or health care professional for advice if you get a fever, chills or sore throat, or other symptoms of a cold or flu. Do not treat yourself. This drug decreases your body's ability to fight infections. Try to avoid being around people who are sick. This medicine may increase your risk to bruise or bleed. Call your doctor or health care professional if you notice any unusual bleeding. Be careful brushing and flossing your teeth or using a toothpick because you may get an infection or bleed more easily. If you have any dental work done, tell your dentist you are receiving this medicine. Avoid taking products that contain aspirin, acetaminophen, ibuprofen, naproxen, or ketoprofen unless instructed by your doctor. These medicines may hide a fever. Do not become pregnant while taking this medicine. Women should inform their doctor if they wish to become  pregnant or think they might be pregnant. There is a potential for serious side effects to an unborn child. Talk to your health care professional or pharmacist for more information. Do not breast-feed an infant while taking this medicine. What side effects may I notice from receiving this medicine? Side effects that you should report to your doctor or health care professional as soon as possible: -allergic reactions like skin rash, itching or hives, swelling of the face, lips, or tongue -low blood counts - this medicine may decrease the number of white blood cells, red blood cells and platelets. You may be at increased risk for infections and bleeding. -signs of infection - fever or chills, cough, sore throat, pain or difficulty passing urine -signs of decreased platelets or bleeding - bruising, pinpoint red spots on the skin, black, tarry stools, blood in the urine -signs of decreased red blood cells - unusually weak or tired, fainting spells, lightheadedness -breathing problems -confused, not responsive -chest pain -fast, irregular heartbeat -feeling faint or lightheaded, falls -mouth sores -redness, blistering, peeling or loosening of the skin, including inside the mouth -stomach pain -swelling of the ankles, feet, or hands -trouble passing urine or change in the amount of urine Side effects that usually do not require medical attention (report to your doctor or  other health care professional if they continue or are bothersome): -anxiety -headache -loss of appetite -muscle aches -nausea -night sweats This list may not describe all possible side effects. Call your doctor for medical advice about side effects. You may report side effects to FDA at 1-800-FDA-1088. Where should I keep my medicine? This drug is given in a hospital or clinic and will not be stored at home. NOTE: This sheet is a summary. It may not cover all possible information. If you have questions about this medicine,  talk to your doctor, pharmacist, or health care provider.  2015, Elsevier/Gold Standard. (2008-03-30 14:04:59)  Cyclophosphamide injection (cytoxan) What is this medicine? CYCLOPHOSPHAMIDE (sye kloe FOSS fa mide) is a chemotherapy drug. It slows the growth of cancer cells. This medicine is used to treat many types of cancer like lymphoma, myeloma, leukemia, breast cancer, and ovarian cancer, to name a few. This medicine may be used for other purposes; ask your health care provider or pharmacist if you have questions. COMMON BRAND NAME(S): Cytoxan, Neosar What should I tell my health care provider before I take this medicine? They need to know if you have any of these conditions: -blood disorders -history of other chemotherapy -infection -kidney disease -liver disease -recent or ongoing radiation therapy -tumors in the bone marrow -an unusual or allergic reaction to cyclophosphamide, other chemotherapy, other medicines, foods, dyes, or preservatives -pregnant or trying to get pregnant -breast-feeding How should I use this medicine? This drug is usually given as an injection into a vein or muscle or by infusion into a vein. It is administered in a hospital or clinic by a specially trained health care professional. Talk to your pediatrician regarding the use of this medicine in children. Special care may be needed. Overdosage: If you think you have taken too much of this medicine contact a poison control center or emergency room at once. NOTE: This medicine is only for you. Do not share this medicine with others. What if I miss a dose? It is important not to miss your dose. Call your doctor or health care professional if you are unable to keep an appointment. What may interact with this medicine? This medicine may interact with the following medications: -amiodarone -amphotericin B -azathioprine -certain antiviral medicines for HIV or AIDS such as protease inhibitors (e.g., indinavir,  ritonavir) and zidovudine -certain blood pressure medications such as benazepril, captopril, enalapril, fosinopril, lisinopril, moexipril, monopril, perindopril, quinapril, ramipril, trandolapril -certain cancer medications such as anthracyclines (e.g., daunorubicin, doxorubicin), busulfan, cytarabine, paclitaxel, pentostatin, tamoxifen, trastuzumab -certain diuretics such as chlorothiazide, chlorthalidone, hydrochlorothiazide, indapamide, metolazone -certain medicines that treat or prevent blood clots like warfarin -certain muscle relaxants such as succinylcholine -cyclosporine -etanercept -indomethacin -medicines to increase blood counts like filgrastim, pegfilgrastim, sargramostim -medicines used as general anesthesia -metronidazole -natalizumab This list may not describe all possible interactions. Give your health care provider a list of all the medicines, herbs, non-prescription drugs, or dietary supplements you use. Also tell them if you smoke, drink alcohol, or use illegal drugs. Some items may interact with your medicine. What should I watch for while using this medicine? Visit your doctor for checks on your progress. This drug may make you feel generally unwell. This is not uncommon, as chemotherapy can affect healthy cells as well as cancer cells. Report any side effects. Continue your course of treatment even though you feel ill unless your doctor tells you to stop. Drink water or other fluids as directed. Urinate often, even at night. In some cases, you  may be given additional medicines to help with side effects. Follow all directions for their use. Call your doctor or health care professional for advice if you get a fever, chills or sore throat, or other symptoms of a cold or flu. Do not treat yourself. This drug decreases your body's ability to fight infections. Try to avoid being around people who are sick. This medicine may increase your risk to bruise or bleed. Call your doctor or  health care professional if you notice any unusual bleeding. Be careful brushing and flossing your teeth or using a toothpick because you may get an infection or bleed more easily. If you have any dental work done, tell your dentist you are receiving this medicine. You may get drowsy or dizzy. Do not drive, use machinery, or do anything that needs mental alertness until you know how this medicine affects you. Do not become pregnant while taking this medicine or for 1 year after stopping it. Women should inform their doctor if they wish to become pregnant or think they might be pregnant. Men should not father a child while taking this medicine and for 4 months after stopping it. There is a potential for serious side effects to an unborn child. Talk to your health care professional or pharmacist for more information. Do not breast-feed an infant while taking this medicine. This medicine may interfere with the ability to have a child. This medicine has caused ovarian failure in some women. This medicine has caused reduced sperm counts in some men. You should talk with your doctor or health care professional if you are concerned about your fertility. If you are going to have surgery, tell your doctor or health care professional that you have taken this medicine. What side effects may I notice from receiving this medicine? Side effects that you should report to your doctor or health care professional as soon as possible: -allergic reactions like skin rash, itching or hives, swelling of the face, lips, or tongue -low blood counts - this medicine may decrease the number of white blood cells, red blood cells and platelets. You may be at increased risk for infections and bleeding. -signs of infection - fever or chills, cough, sore throat, pain or difficulty passing urine -signs of decreased platelets or bleeding - bruising, pinpoint red spots on the skin, black, tarry stools, blood in the urine -signs of  decreased red blood cells - unusually weak or tired, fainting spells, lightheadedness -breathing problems -dark urine -dizziness -palpitations -swelling of the ankles, feet, hands -trouble passing urine or change in the amount of urine -weight gain -yellowing of the eyes or skin Side effects that usually do not require medical attention (report to your doctor or health care professional if they continue or are bothersome): -changes in nail or skin color -hair loss -missed menstrual periods -mouth sores -nausea, vomiting This list may not describe all possible side effects. Call your doctor for medical advice about side effects. You may report side effects to FDA at 1-800-FDA-1088. Where should I keep my medicine? This drug is given in a hospital or clinic and will not be stored at home. NOTE: This sheet is a summary. It may not cover all possible information. If you have questions about this medicine, talk to your doctor, pharmacist, or health care provider.  2015, Elsevier/Gold Standard. (2012-06-14 16:22:58)   Vincristine injection What is this medicine? VINCRISTINE (vin KRIS teen) is a chemotherapy drug. It slows the growth of cancer cells. This medicine is used to  treat many types of cancer like Hodgkin's disease, leukemia, non-Hodgkin's lymphoma, neuroblastoma (brain cancer), rhabdomyosarcoma, and Wilms' tumor. This medicine may be used for other purposes; ask your health care provider or pharmacist if you have questions. COMMON BRAND NAME(S): Oncovin, Vincasar PFS What should I tell my health care provider before I take this medicine? They need to know if you have any of these conditions: -blood disorders -gout -infection (especially chickenpox, cold sores, or herpes) -kidney disease -liver disease -lung disease -nervous system disease like Charcot-Marie-Tooth (CMT) -recent or ongoing radiation therapy -an unusual or allergic reaction to vincristine, other chemotherapy  agents, other medicines, foods, dyes, or preservatives -pregnant or trying to get pregnant -breast-feeding How should I use this medicine? This drug is given as an infusion into a vein. It is administered in a hospital or clinic by a specially trained health care professional. If you have pain, swelling, burning, or any unusual feeling around the site of your injection, tell your health care professional right away. Talk to your pediatrician regarding the use of this medicine in children. While this drug may be prescribed for selected conditions, precautions do apply. Overdosage: If you think you have taken too much of this medicine contact a poison control center or emergency room at once. NOTE: This medicine is only for you. Do not share this medicine with others. What if I miss a dose? It is important not to miss your dose. Call your doctor or health care professional if you are unable to keep an appointment. What may interact with this medicine? Do not take this medicine with any of the following medications: -itraconazole -mibefradil -voriconazole This medicine may also interact with the following medications: -cyclosporine -erythromycin -fluconazole -ketoconazole -medicines for HIV like delavirdine, efavirenz, nevirapine -medicines for seizures like ethotoin, fosphenotoin, phenytoin -medicines to increase blood counts like filgrastim, pegfilgrastim, sargramostim -other chemotherapy drugs like cisplatin, L-asparaginase, methotrexate, mitomycin, paclitaxel -pegaspargase -vaccines -zalcitabine, ddC Talk to your doctor or health care professional before taking any of these medicines: -acetaminophen -aspirin -ibuprofen -ketoprofen -naproxen This list may not describe all possible interactions. Give your health care provider a list of all the medicines, herbs, non-prescription drugs, or dietary supplements you use. Also tell them if you smoke, drink alcohol, or use illegal drugs.  Some items may interact with your medicine. What should I watch for while using this medicine? Your condition will be monitored carefully while you are receiving this medicine. You will need important blood work done while you are taking this medicine. This drug may make you feel generally unwell. This is not uncommon, as chemotherapy can affect healthy cells as well as cancer cells. Report any side effects. Continue your course of treatment even though you feel ill unless your doctor tells you to stop. In some cases, you may be given additional medicines to help with side effects. Follow all directions for their use. Call your doctor or health care professional for advice if you get a fever, chills or sore throat, or other symptoms of a cold or flu. Do not treat yourself. Avoid taking products that contain aspirin, acetaminophen, ibuprofen, naproxen, or ketoprofen unless instructed by your doctor. These medicines may hide a fever. Do not become pregnant while taking this medicine. Women should inform their doctor if they wish to become pregnant or think they might be pregnant. There is a potential for serious side effects to an unborn child. Talk to your health care professional or pharmacist for more information. Do not breast-feed an infant while  taking this medicine. Men may have a lower sperm count while taking this medicine. Talk to your doctor if you plan to father a child. What side effects may I notice from receiving this medicine? Side effects that you should report to your doctor or health care professional as soon as possible: -allergic reactions like skin rash, itching or hives, swelling of the face, lips, or tongue -breathing problems -confusion or changes in emotions or moods -constipation -cough -mouth sores -muscle weakness -nausea and vomiting -pain, swelling, redness or irritation at the injection site -pain, tingling, numbness in the hands or feet -problems with balance,  talking, walking -seizures -stomach pain -trouble passing urine or change in the amount of urine Side effects that usually do not require medical attention (report to your doctor or health care professional if they continue or are bothersome): -diarrhea -hair loss -jaw pain -loss of appetite This list may not describe all possible side effects. Call your doctor for medical advice about side effects. You may report side effects to FDA at 1-800-FDA-1088. Where should I keep my medicine? This drug is given in a hospital or clinic and will not be stored at home. NOTE: This sheet is a summary. It may not cover all possible information. If you have questions about this medicine, talk to your doctor, pharmacist, or health care provider.  2015, Elsevier/Gold Standard. (2008-04-27 17:17:13)

## 2015-03-18 NOTE — Patient Instructions (Signed)

## 2015-03-18 NOTE — Assessment & Plan Note (Signed)
This is related to morbid obesity and chronic congestive heart failure. He will continue diuretic therapy as directed.

## 2015-03-18 NOTE — Progress Notes (Signed)
Addendum: Patient initially arrived to the [facility] today for chemotherapy.  His O2 sat on room air was 95%.  Throughout the day.  Patient's O2 sat has dropped to as low as 87%, on occasion while at rest.  Patient denies any shortness of breath or respiratory distress.  On exam.-Patient noted to have wheezing to all lung fields.  Vital signs essentially stable; and patient was afebrile.  Patient was given 2 back-to-back albuterol nebulizer treatments; which cleared most of the wheezing to his lung fields.  Patient continue to have some left lower lobe wheezing.  O2 sats improved to 93-94% on room air.  Patient states that he already has nebulizer treatments/machine at home to use on an as-needed basis.  Reviewed these findings with Dr. Robie Ridge decision was made to increase patient's prednisone to 40 mg by mouth daily for a total of 3 days.  Patient was instructed to return to the prednisone 10 mg daily.  Following the high dose prednisone.  Patient and his wife are both advised to go directed to the emergency department for any worsening symptoms whatsoever.  Both patient and his wife stated understanding of all instructions; warned agreement with this plan of care.

## 2015-03-19 ENCOUNTER — Telehealth: Payer: Self-pay | Admitting: *Deleted

## 2015-03-19 ENCOUNTER — Telehealth: Payer: Self-pay | Admitting: Internal Medicine

## 2015-03-19 MED ORDER — TRIAMCINOLONE ACETONIDE 0.1 % EX OINT: TOPICAL_OINTMENT | Freq: Three times a day (TID) | CUTANEOUS | Status: DC

## 2015-03-19 NOTE — Telephone Encounter (Signed)
Spoke to pt- He was notified by the pharmacy that the medication was prescribed incorrectly and he had additional prednisone tablets to pick up. Pt confirms he has taken prednisone 40mg  today. Verbalized an understanding of the dose change. Acknowledges return to his usual dose of prednisone 10mg  starting Monday.  Pt reports he is feeling better today- breathing has improved. Pt denies any wheezing. Pt complains of nausea and weakness "this is normal after a treatment"- has antiemetics he will take when necessary. Pt will contact this office with any questions or concerns.

## 2015-03-19 NOTE — Telephone Encounter (Signed)
Receive call pt states he is needing refills on his triamcinolone cream. He uses cream everyday and would like additional refills. Verified pharmacy inform pt will send to [facility].../lmb

## 2015-03-19 NOTE — Telephone Encounter (Signed)
error 

## 2015-03-19 NOTE — Telephone Encounter (Signed)
Per staff message and POF I have scheduled appts. Advised scheduler of appts. JMW  

## 2015-03-21 ENCOUNTER — Other Ambulatory Visit: Payer: Self-pay | Admitting: Internal Medicine

## 2015-03-24 NOTE — Telephone Encounter (Signed)
Pharmacist call checking the status on the refill for prednisone 10 mg. Dr. Alain Marion is this ok to be refill...Johny Chess

## 2015-03-26 NOTE — Telephone Encounter (Signed)
Pharmacy is calling back regarding this refill

## 2015-03-29 ENCOUNTER — Encounter (HOSPITAL_BASED_OUTPATIENT_CLINIC_OR_DEPARTMENT_OTHER): Payer: BLUE CROSS/BLUE SHIELD | Attending: Plastic Surgery

## 2015-03-29 DIAGNOSIS — I10 Essential (primary) hypertension: Secondary | ICD-10-CM | POA: Insufficient documentation

## 2015-03-29 DIAGNOSIS — E1140 Type 2 diabetes mellitus with diabetic neuropathy, unspecified: Secondary | ICD-10-CM | POA: Insufficient documentation

## 2015-03-29 DIAGNOSIS — J449 Chronic obstructive pulmonary disease, unspecified: Secondary | ICD-10-CM | POA: Insufficient documentation

## 2015-03-29 DIAGNOSIS — I509 Heart failure, unspecified: Secondary | ICD-10-CM | POA: Diagnosis not present

## 2015-03-29 DIAGNOSIS — C8221 Follicular lymphoma grade III, unspecified, lymph nodes of head, face, and neck: Secondary | ICD-10-CM | POA: Diagnosis not present

## 2015-03-29 DIAGNOSIS — I890 Lymphedema, not elsewhere classified: Secondary | ICD-10-CM | POA: Diagnosis not present

## 2015-03-29 DIAGNOSIS — I252 Old myocardial infarction: Secondary | ICD-10-CM | POA: Insufficient documentation

## 2015-03-29 DIAGNOSIS — L97212 Non-pressure chronic ulcer of right calf with fat layer exposed: Secondary | ICD-10-CM | POA: Diagnosis not present

## 2015-03-29 DIAGNOSIS — E1159 Type 2 diabetes mellitus with other circulatory complications: Secondary | ICD-10-CM | POA: Insufficient documentation

## 2015-03-29 DIAGNOSIS — I25119 Atherosclerotic heart disease of native coronary artery with unspecified angina pectoris: Secondary | ICD-10-CM | POA: Diagnosis not present

## 2015-03-29 DIAGNOSIS — I83015 Varicose veins of right lower extremity with ulcer other part of foot: Secondary | ICD-10-CM | POA: Diagnosis not present

## 2015-03-30 ENCOUNTER — Ambulatory Visit (INDEPENDENT_AMBULATORY_CARE_PROVIDER_SITE_OTHER): Payer: BLUE CROSS/BLUE SHIELD | Admitting: Internal Medicine

## 2015-03-30 ENCOUNTER — Encounter: Payer: Self-pay | Admitting: Internal Medicine

## 2015-03-30 VITALS — BP 114/60 | HR 74 | Ht 72.0 in | Wt 364.0 lb

## 2015-03-30 DIAGNOSIS — Z23 Encounter for immunization: Secondary | ICD-10-CM | POA: Diagnosis not present

## 2015-03-30 DIAGNOSIS — I5032 Chronic diastolic (congestive) heart failure: Secondary | ICD-10-CM

## 2015-03-30 DIAGNOSIS — L03115 Cellulitis of right lower limb: Secondary | ICD-10-CM

## 2015-03-30 DIAGNOSIS — G4730 Sleep apnea, unspecified: Secondary | ICD-10-CM | POA: Diagnosis not present

## 2015-03-30 DIAGNOSIS — E119 Type 2 diabetes mellitus without complications: Secondary | ICD-10-CM

## 2015-03-30 DIAGNOSIS — I872 Venous insufficiency (chronic) (peripheral): Secondary | ICD-10-CM

## 2015-03-30 DIAGNOSIS — Z0000 Encounter for general adult medical examination without abnormal findings: Secondary | ICD-10-CM | POA: Diagnosis not present

## 2015-03-30 DIAGNOSIS — C8230 Follicular lymphoma grade IIIa, unspecified site: Secondary | ICD-10-CM

## 2015-03-30 DIAGNOSIS — R600 Localized edema: Secondary | ICD-10-CM

## 2015-03-30 NOTE — Progress Notes (Signed)
Subjective:  Patient ID: [name], male    DOB: 1956/02/01  Age: 59 y.o. MRN: [id number]  CC: Annual Exam   HPI SABIN GIBEAULT presents for R leg shooting pains - better. F/u LE ulcers. 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 Pt started to go to Wound Clinic   Outpatient Prescriptions Prior to Visit  Medication Sig Dispense Refill  . albuterol (PROVENTIL) (2.5 MG/3ML) 0.083% nebulizer solution Take 3 mLs (2.5 mg total) by nebulization every 2 (two) hours as needed for wheezing. 75 mL 2  . ANORO ELLIPTA 62.5-25 MCG/INH AEPB as needed.    Marland Kitchen aspirin EC 81 MG tablet Take 81 mg by mouth daily.    . clonazePAM (KLONOPIN) 0.5 MG tablet TAKE 1 TABLET BY MOUTH TWICE A DAY AS NEEDED FOR ANXIETY or at hs for insomnia 180 tablet 0  . clopidogrel (PLAVIX) 75 MG tablet TAKE 1 TABLET BY MOUTH DAILY WITH BREAKFAST. 90 tablet 3  . metoprolol succinate (TOPROL-XL) 50 MG 24 hr tablet TAKE 2 TABLETS EVERY DAY 180 tablet 3  . nitroGLYCERIN (NITROSTAT) 0.4 MG SL tablet Place 1 tablet (0.4 mg total) under the tongue every 5 (five) minutes as needed for chest pain (Up to 3 doses). 25 tablet 4  . oxyCODONE-acetaminophen (PERCOCET/ROXICET) 5-325 MG per tablet Take 1 tablet by mouth every 6 (six) hours as needed for moderate pain. 120 tablet 0  . potassium chloride SA (KLOR-CON M20) 20 MEQ tablet TAKE 1 TABLET BY MOUTH DAILY 90 tablet 3  . simvastatin (ZOCOR) 20 MG tablet TAKE 1 TABLET (20 MG TOTAL) BY MOUTH AT BEDTIME. 90 tablet 3  . torsemide (DEMADEX) 100 MG tablet Take 100 mg by mouth daily.    Marland Kitchen triamcinolone ointment (KENALOG) 0.1 % Apply topically 3 (three) times daily. 90 g 3  . sulfamethoxazole-trimethoprim (BACTRIM DS) 800-160 MG per tablet Take 1 tablet by mouth 2 (two) times daily. 14 tablet 0  . acyclovir (ZOVIRAX) 400 MG tablet Take 400 mg by mouth daily.  3  .  allopurinol (ZYLOPRIM) 300 MG tablet Take 300 mg by mouth daily.  3  . hydrALAZINE (APRESOLINE) 50 MG tablet Take 1 tablet (50 mg total) by mouth every 8 (eight) hours. (Patient not taking: Reported on 03/30/2015) 90 tablet 0  . predniSONE (DELTASONE) 10 MG tablet TAKE 1 TABLET EVERY DAY (Patient not taking: Reported on 03/30/2015) 30 tablet 0  . predniSONE (DELTASONE) 20 MG tablet Take 2 tabs (40 mg) PO QD x 3 days only. (Patient not taking: Reported on 03/30/2015) 12 tablet 0  . tretinoin (RETIN-A) 0.025 % cream Apply 1 application topically as needed.      No facility-administered medications prior to visit.    ROS Review of Systems  Constitutional: Positive for fatigue and unexpected weight change. Negative for appetite change.  HENT: Negative for congestion, nosebleeds, sneezing, sore throat and trouble swallowing.   Eyes: Negative for itching and visual disturbance.  Respiratory: Positive for shortness of breath. Negative for cough.   Cardiovascular: Positive for leg swelling. Negative for chest pain and palpitations.  Gastrointestinal: Negative for nausea, diarrhea, blood in stool and abdominal distention.  Genitourinary: Negative for frequency and hematuria.  Musculoskeletal: Negative for back pain, joint swelling, gait problem and neck pain.  Skin: Positive for rash and wound.  Neurological: Negative for dizziness, tremors, speech difficulty, weakness and headaches.  Hematological: Does not bruise/bleed  easily.  Psychiatric/Behavioral: Negative for suicidal ideas, confusion, sleep disturbance, dysphoric mood and agitation. The patient is not nervous/anxious.     Objective:  BP 114/60 mmHg  Pulse 74  Ht 6' (1.829 m)  Wt 364 lb (165.109 kg)  BMI 49.36 kg/m2  SpO2 94%  BP Readings from Last 3 Encounters:  03/30/15 114/60  03/18/15 145/55  03/18/15 141/65    Wt Readings from Last 3 Encounters:  03/30/15 364 lb (165.109 kg)  03/18/15 358 lb 12.8 oz (162.751 kg)  02/22/15  345 lb (156.491 kg)    Physical Exam  Constitutional: He is oriented to person, place, and time. He appears well-developed. No distress.  NAD  HENT:  Mouth/Throat: Oropharynx is clear and moist.  Eyes: Conjunctivae are normal. Pupils are equal, round, and reactive to light.  Neck: Normal range of motion. No JVD present. No thyromegaly present.  Cardiovascular: Normal rate, regular rhythm, normal heart sounds and intact distal pulses.  Exam reveals no gallop and no friction rub.   No murmur heard. Pulmonary/Chest: Effort normal and breath sounds normal. No respiratory distress. He has no wheezes. He has no rales. He exhibits no tenderness.  Abdominal: Soft. Bowel sounds are normal. He exhibits no distension and no mass. There is no tenderness. There is no rebound and no guarding.  Musculoskeletal: Normal range of motion. He exhibits edema and tenderness.  Lymphadenopathy:    He has no cervical adenopathy.  Neurological: He is alert and oriented to person, place, and time. He has normal reflexes. No cranial nerve deficit. He exhibits normal muscle tone. He displays a negative Romberg sign. Coordination and gait normal.  Skin: Skin is warm and dry. No rash noted.  Psychiatric: He has a normal mood and affect. His behavior is normal. Judgment and thought content normal.  Obese 1-2+ edema B R ankle wrapped  Lab Results  Component Value Date   WBC 11.3* 03/18/2015   HGB 13.5 03/18/2015   HCT 41.5 03/18/2015   PLT 136* 03/18/2015   GLUCOSE 109 03/18/2015   CHOL 131 03/25/2014   TRIG 185.0* 03/25/2014   HDL 24.00* 03/25/2014   LDLCALC 70 03/25/2014   ALT 20 03/18/2015   AST 16 03/18/2015   NA 140 03/18/2015   K 4.1 03/18/2015   CL 99 01/27/2015   CREATININE 0.8 03/18/2015   BUN 12.3 03/18/2015   CO2 31* 03/18/2015   TSH 2.01 02/25/2013   PSA 0.23 09/15/2011   INR 1.00 12/28/2014   HGBA1C 6.5 01/27/2015    No results found.  Assessment & Plan:   There are no diagnoses  linked to this encounter. I have discontinued Mr. Rabine sulfamethoxazole-trimethoprim. I am also having him maintain his aspirin EC, nitroGLYCERIN, clopidogrel, metoprolol succinate, potassium chloride SA, simvastatin, torsemide, albuterol, hydrALAZINE, tretinoin, ANORO ELLIPTA, allopurinol, acyclovir, clonazePAM, oxyCODONE-acetaminophen, predniSONE, triamcinolone ointment, predniSONE, and SANTYL.  Meds ordered this encounter  Medications  . SANTYL ointment    Sig: every other day.     Follow-up: No Follow-up on file.  Walker Kehr, MD

## 2015-03-30 NOTE — Progress Notes (Signed)
Pre visit review using our clinic review tool, if applicable. No additional management support is needed unless otherwise documented below in the visit note. 

## 2015-04-01 ENCOUNTER — Encounter: Payer: BLUE CROSS/BLUE SHIELD | Admitting: Cardiovascular Disease

## 2015-04-01 DIAGNOSIS — Z0000 Encounter for general adult medical examination without abnormal findings: Secondary | ICD-10-CM | POA: Insufficient documentation

## 2015-04-01 NOTE — Assessment & Plan Note (Signed)
On Torsemide, Hydralazine Refusing CPAP

## 2015-04-01 NOTE — Assessment & Plan Note (Signed)
Pt refused CPAP

## 2015-04-01 NOTE — Assessment & Plan Note (Signed)
Chronic On Torsemide Refusing CPAP

## 2015-04-01 NOTE — Assessment & Plan Note (Signed)
2016 Dr Alvy Bimler On chemo

## 2015-04-01 NOTE — Assessment & Plan Note (Signed)
Chronic  8/16 worse w/steroids On diet

## 2015-04-01 NOTE — Assessment & Plan Note (Signed)
6/16 Chronic ulcers. Wound clinic.

## 2015-04-01 NOTE — Assessment & Plan Note (Signed)
We discussed age appropriate health related issues, including available/recomended screening tests and vaccinations. We discussed a need for adhering to healthy diet and exercise. Labs/EKG were reviewed/ordered. All questions were answered.   

## 2015-04-05 ENCOUNTER — Other Ambulatory Visit: Payer: Self-pay

## 2015-04-05 DIAGNOSIS — I890 Lymphedema, not elsewhere classified: Secondary | ICD-10-CM | POA: Diagnosis not present

## 2015-04-05 DIAGNOSIS — I10 Essential (primary) hypertension: Secondary | ICD-10-CM | POA: Diagnosis not present

## 2015-04-05 DIAGNOSIS — J449 Chronic obstructive pulmonary disease, unspecified: Secondary | ICD-10-CM | POA: Diagnosis not present

## 2015-04-05 DIAGNOSIS — L97212 Non-pressure chronic ulcer of right calf with fat layer exposed: Secondary | ICD-10-CM | POA: Diagnosis not present

## 2015-04-08 ENCOUNTER — Telehealth: Payer: Self-pay | Admitting: Hematology and Oncology

## 2015-04-08 ENCOUNTER — Ambulatory Visit: Payer: BLUE CROSS/BLUE SHIELD

## 2015-04-08 ENCOUNTER — Other Ambulatory Visit (HOSPITAL_BASED_OUTPATIENT_CLINIC_OR_DEPARTMENT_OTHER): Payer: BLUE CROSS/BLUE SHIELD

## 2015-04-08 ENCOUNTER — Ambulatory Visit (HOSPITAL_BASED_OUTPATIENT_CLINIC_OR_DEPARTMENT_OTHER): Payer: BLUE CROSS/BLUE SHIELD

## 2015-04-08 ENCOUNTER — Ambulatory Visit (HOSPITAL_BASED_OUTPATIENT_CLINIC_OR_DEPARTMENT_OTHER): Payer: BLUE CROSS/BLUE SHIELD | Admitting: Hematology and Oncology

## 2015-04-08 VITALS — BP 126/37 | HR 86 | Temp 98.2°F | Resp 18 | Ht 72.0 in | Wt 366.7 lb

## 2015-04-08 VITALS — BP 129/54 | HR 72 | Temp 98.5°F | Resp 16

## 2015-04-08 DIAGNOSIS — L03115 Cellulitis of right lower limb: Secondary | ICD-10-CM

## 2015-04-08 DIAGNOSIS — R600 Localized edema: Secondary | ICD-10-CM

## 2015-04-08 DIAGNOSIS — C8230 Follicular lymphoma grade IIIa, unspecified site: Secondary | ICD-10-CM | POA: Diagnosis not present

## 2015-04-08 DIAGNOSIS — Z5111 Encounter for antineoplastic chemotherapy: Secondary | ICD-10-CM | POA: Diagnosis not present

## 2015-04-08 DIAGNOSIS — J449 Chronic obstructive pulmonary disease, unspecified: Secondary | ICD-10-CM

## 2015-04-08 DIAGNOSIS — I5032 Chronic diastolic (congestive) heart failure: Secondary | ICD-10-CM

## 2015-04-08 DIAGNOSIS — Z95828 Presence of other vascular implants and grafts: Secondary | ICD-10-CM

## 2015-04-08 DIAGNOSIS — C44519 Basal cell carcinoma of skin of other part of trunk: Secondary | ICD-10-CM

## 2015-04-08 LAB — COMPREHENSIVE METABOLIC PANEL (CC13)
ALT: 17 U/L (ref 0–55)
AST: 13 U/L (ref 5–34)
Albumin: 3.4 g/dL — ABNORMAL LOW (ref 3.5–5.0)
Alkaline Phosphatase: 85 U/L (ref 40–150)
Anion Gap: 10 mEq/L (ref 3–11)
BUN: 12.8 mg/dL (ref 7.0–26.0)
CHLORIDE: 95 meq/L — AB (ref 98–109)
CO2: 36 meq/L — AB (ref 22–29)
Calcium: 9 mg/dL (ref 8.4–10.4)
Creatinine: 0.9 mg/dL (ref 0.7–1.3)
EGFR: 87 mL/min/{1.73_m2} — ABNORMAL LOW (ref >90)
Glucose: 123 mg/dl (ref 70–140)
POTASSIUM: 3.8 meq/L (ref 3.5–5.1)
SODIUM: 141 meq/L (ref 136–145)
Total Bilirubin: 0.38 mg/dL (ref 0.20–1.20)
Total Protein: 6.5 g/dL (ref 6.4–8.3)

## 2015-04-08 LAB — CBC WITH DIFFERENTIAL/PLATELET
BASO%: 0.9 % (ref 0.0–2.0)
BASOS ABS: 0.1 10*3/uL (ref 0.0–0.1)
EOS ABS: 0.1 10*3/uL (ref 0.0–0.5)
EOS%: 1.5 % (ref 0.0–7.0)
HCT: 42.1 % (ref 38.4–49.9)
HEMOGLOBIN: 13.3 g/dL (ref 13.0–17.1)
LYMPH%: 8.6 % — ABNORMAL LOW (ref 14.0–49.0)
MCH: 32.9 pg (ref 27.2–33.4)
MCHC: 31.6 g/dL — ABNORMAL LOW (ref 32.0–36.0)
MCV: 104.2 fL — AB (ref 79.3–98.0)
MONO#: 1.5 10*3/uL — AB (ref 0.1–0.9)
MONO%: 17.3 % — AB (ref 0.0–14.0)
NEUT%: 71.7 % (ref 39.0–75.0)
NEUTROS ABS: 6.3 10*3/uL (ref 1.5–6.5)
PLATELETS: 204 10*3/uL (ref 140–400)
RBC: 4.04 10*6/uL — ABNORMAL LOW (ref 4.20–5.82)
RDW: 15.1 % — ABNORMAL HIGH (ref 11.0–14.6)
WBC: 8.8 10*3/uL (ref 4.0–10.3)
lymph#: 0.8 10*3/uL — ABNORMAL LOW (ref 0.9–3.3)

## 2015-04-08 MED ORDER — SODIUM CHLORIDE 0.9 % IJ SOLN
10.0000 mL | INTRAMUSCULAR | Status: DC | PRN
  Administered 2015-04-08: 10 mL via INTRAVENOUS
  Filled 2015-04-08: qty 10

## 2015-04-08 MED ORDER — DIPHENHYDRAMINE HCL 25 MG PO CAPS
ORAL_CAPSULE | ORAL | Status: AC
  Filled 2015-04-08: qty 2

## 2015-04-08 MED ORDER — SODIUM CHLORIDE 0.9 % IJ SOLN
10.0000 mL | INTRAMUSCULAR | Status: DC | PRN
  Administered 2015-04-08: 10 mL
  Filled 2015-04-08: qty 10

## 2015-04-08 MED ORDER — ACETAMINOPHEN 325 MG PO TABS
650.0000 mg | ORAL_TABLET | Freq: Once | ORAL | Status: AC
  Administered 2015-04-08: 650 mg via ORAL

## 2015-04-08 MED ORDER — SODIUM CHLORIDE 0.9 % IV SOLN
Freq: Once | INTRAVENOUS | Status: AC
  Administered 2015-04-08: 13:00:00 via INTRAVENOUS
  Filled 2015-04-08: qty 8

## 2015-04-08 MED ORDER — SODIUM CHLORIDE 0.9 % IV SOLN
800.0000 mg/m2 | Freq: Once | INTRAVENOUS | Status: AC
  Administered 2015-04-08: 2240 mg via INTRAVENOUS
  Filled 2015-04-08: qty 112

## 2015-04-08 MED ORDER — VINCRISTINE SULFATE CHEMO INJECTION 1 MG/ML
2.0000 mg | Freq: Once | INTRAVENOUS | Status: AC
  Administered 2015-04-08: 2 mg via INTRAVENOUS
  Filled 2015-04-08: qty 2

## 2015-04-08 MED ORDER — ANTICOAGULANT SODIUM CITRATE 4% (200MG/5ML) IV SOLN
5.0000 mL | Freq: Once | Status: AC | PRN
  Administered 2015-04-08: 5 mL via INTRAVENOUS
  Filled 2015-04-08 (×2): qty 5

## 2015-04-08 MED ORDER — PREDNISONE 20 MG PO TABS: ORAL_TABLET | ORAL | Status: DC

## 2015-04-08 MED ORDER — SODIUM CHLORIDE 0.9 % IV SOLN
Freq: Once | INTRAVENOUS | Status: AC
  Administered 2015-04-08: 11:00:00 via INTRAVENOUS

## 2015-04-08 MED ORDER — DIPHENHYDRAMINE HCL 25 MG PO CAPS
50.0000 mg | ORAL_CAPSULE | Freq: Once | ORAL | Status: AC
  Administered 2015-04-08: 50 mg via ORAL

## 2015-04-08 MED ORDER — ACETAMINOPHEN 325 MG PO TABS
ORAL_TABLET | ORAL | Status: AC
  Filled 2015-04-08: qty 2

## 2015-04-08 MED ORDER — SODIUM CHLORIDE 0.9 % IV SOLN
375.0000 mg/m2 | Freq: Once | INTRAVENOUS | Status: AC
  Administered 2015-04-08: 1100 mg via INTRAVENOUS
  Filled 2015-04-08: qty 110

## 2015-04-08 NOTE — Assessment & Plan Note (Signed)
He has chronic shortness of breath and remained on multiple inhalers. We will continue current treatment

## 2015-04-08 NOTE — Patient Instructions (Signed)
[facility] Discharge Instructions for Patients Receiving Chemotherapy  Today you received the following chemotherapy agents Vincristine/Cytoxan/Rituxan.  To help prevent nausea and vomiting after your treatment, we encourage you to take your nausea medication as directed.   If you develop nausea and vomiting that is not controlled by your nausea medication, call the clinic.   BELOW ARE SYMPTOMS THAT SHOULD BE REPORTED IMMEDIATELY:  *FEVER GREATER THAN 100.5 F  *CHILLS WITH OR WITHOUT FEVER  NAUSEA AND VOMITING THAT IS NOT CONTROLLED WITH YOUR NAUSEA MEDICATION  *UNUSUAL SHORTNESS OF BREATH  *UNUSUAL BRUISING OR BLEEDING  TENDERNESS IN MOUTH AND THROAT WITH OR WITHOUT PRESENCE OF ULCERS  *URINARY PROBLEMS  *BOWEL PROBLEMS  UNUSUAL RASH Items with * indicate a potential emergency and should be followed up as soon as possible.  Feel free to call the clinic you have any questions or concerns. The clinic phone number is [phone].  Please show the [facility] at check-in to the Emergency Department and triage nurse.  Rituximab injection What is this medicine? RITUXIMAB (ri TUX i mab) is a monoclonal antibody. This medicine changes the way the body's immune system works. It is used commonly to treat non-Hodgkin's lymphoma and other conditions. In cancer cells, this drug targets a specific protein within cancer cells and stops the cancer cells from growing. It is also used to treat rhuematoid arthritis (RA). In RA, this medicine slow the inflammatory process and help reduce joint pain and swelling. This medicine is often used with other cancer or arthritis medications. This medicine may be used for other purposes; ask your health care provider or pharmacist if you have questions. COMMON BRAND NAME(S): Rituxan What should I tell my health care provider before I take this medicine? They need to know if you have any of these conditions: -blood disorders -heart  disease -history of hepatitis B -infection (especially a virus infection such as chickenpox, cold sores, or herpes) -irregular heartbeat -kidney disease -lung or breathing disease, like asthma -lupus -an unusual or allergic reaction to rituximab, mouse proteins, other medicines, foods, dyes, or preservatives -pregnant or trying to get pregnant -breast-feeding How should I use this medicine? This medicine is for infusion into a vein. It is administered in a hospital or clinic by a specially trained health care professional. A special MedGuide will be given to you by the pharmacist with each prescription and refill. Be sure to read this information carefully each time. Talk to your pediatrician regarding the use of this medicine in children. This medicine is not approved for use in children. Overdosage: If you think you have taken too much of this medicine contact a poison control center or emergency room at once. NOTE: This medicine is only for you. Do not share this medicine with others. What if I miss a dose? It is important not to miss a dose. Call your doctor or health care professional if you are unable to keep an appointment. What may interact with this medicine? -cisplatin -medicines for blood pressure -some other medicines for arthritis -vaccines This list may not describe all possible interactions. Give your health care provider a list of all the medicines, herbs, non-prescription drugs, or dietary supplements you use. Also tell them if you smoke, drink alcohol, or use illegal drugs. Some items may interact with your medicine. What should I watch for while using this medicine? Report any side effects that you notice during your treatment right away, such as changes in your breathing, fever, chills, dizziness  or lightheadedness. These effects are more common with the first dose. Visit your prescriber or health care professional for checks on your progress. You will need to have  regular blood work. Report any other side effects. The side effects of this medicine can continue after you finish your treatment. Continue your course of treatment even though you feel ill unless your doctor tells you to stop. Call your doctor or health care professional for advice if you get a fever, chills or sore throat, or other symptoms of a cold or flu. Do not treat yourself. This drug decreases your body's ability to fight infections. Try to avoid being around people who are sick. This medicine may increase your risk to bruise or bleed. Call your doctor or health care professional if you notice any unusual bleeding. Be careful brushing and flossing your teeth or using a toothpick because you may get an infection or bleed more easily. If you have any dental work done, tell your dentist you are receiving this medicine. Avoid taking products that contain aspirin, acetaminophen, ibuprofen, naproxen, or ketoprofen unless instructed by your doctor. These medicines may hide a fever. Do not become pregnant while taking this medicine. Women should inform their doctor if they wish to become pregnant or think they might be pregnant. There is a potential for serious side effects to an unborn child. Talk to your health care professional or pharmacist for more information. Do not breast-feed an infant while taking this medicine. What side effects may I notice from receiving this medicine? Side effects that you should report to your doctor or health care professional as soon as possible: -allergic reactions like skin rash, itching or hives, swelling of the face, lips, or tongue -low blood counts - this medicine may decrease the number of white blood cells, red blood cells and platelets. You may be at increased risk for infections and bleeding. -signs of infection - fever or chills, cough, sore throat, pain or difficulty passing urine -signs of decreased platelets or bleeding - bruising, pinpoint red spots on the  skin, black, tarry stools, blood in the urine -signs of decreased red blood cells - unusually weak or tired, fainting spells, lightheadedness -breathing problems -confused, not responsive -chest pain -fast, irregular heartbeat -feeling faint or lightheaded, falls -mouth sores -redness, blistering, peeling or loosening of the skin, including inside the mouth -stomach pain -swelling of the ankles, feet, or hands -trouble passing urine or change in the amount of urine Side effects that usually do not require medical attention (report to your doctor or other health care professional if they continue or are bothersome): -anxiety -headache -loss of appetite -muscle aches -nausea -night sweats This list may not describe all possible side effects. Call your doctor for medical advice about side effects. You may report side effects to FDA at 1-800-FDA-1088. Where should I keep my medicine? This drug is given in a hospital or clinic and will not be stored at home. NOTE: This sheet is a summary. It may not cover all possible information. If you have questions about this medicine, talk to your doctor, pharmacist, or health care provider.  2015, Elsevier/Gold Standard. (2008-03-30 14:04:59)

## 2015-04-08 NOTE — Progress Notes (Signed)
[facility] OFFICE PROGRESS NOTE  Patient Care Team: [name], MD as PCP - General (Internal Medicine) [name], MD as Consulting Physician (Cardiology) Heath Lark, MD as Consulting Physician (Hematology and Oncology) Jerrell Belfast, MD as Consulting Physician (Otolaryngology) Daryll Brod, MD as Consulting Physician (Orthopedic Surgery)  SUMMARY OF ONCOLOGIC HISTORY: Oncology History   FLIPI score of 2; stage 3 and areas of involvement >4     Follicular lymphoma grade 3a   06/24/2009 Imaging PET CT scan showed two small FDG positive left neck nodes   12/11/2014 Surgery He underwent excisional lymph node biopsy of the left supraclavicular lymph node/neck region   12/11/2014 Pathology Results Accession: [id number] biopsy show follicular lymphoma   02/14/5008 Imaging ECHO showed LVH but preserved EF   12/25/2014 Imaging PET scan showed disease above and below diaphragm   12/28/2014 Procedure He has port placement   12/31/2014 - 01/01/2015 Chemotherapy He received 1 cycle of bendamustine with rituximab, discontinued due to suspicion of allergic reaction to bendamustine   01/12/2015 - 01/19/2015 Hospital Admission He was admitted to the hospital with suspicious allergic reaction, significant bilateral lower extremity edema with cellulitis, pneumonia and mild fluid overload   03/18/2015 -  Chemotherapy Treatment is switched to rituximab, Cytoxan, vincristine and prednisone    INTERVAL HISTORY: Please see below for problem oriented charting.  he is seen prior to cycle 2 of chemotherapy. He tolerated treatment well. He continues to have problem with shortness of breath on minimal exertion and significant bilateral lower extremity edema. He had mild chronic cough but it is nonproductive. Denies new skin rashes. The skin lesion recently biopsied and proved to be basal cell carcinoma. He denies other new skin lesions. Denies any side effects from chemotherapy.  REVIEW OF  SYSTEMS:   Constitutional: Denies fevers, chills or abnormal weight loss Eyes: Denies blurriness of vision Ears, nose, mouth, throat, and face: Denies mucositis or sore throat Cardiovascular: Denies palpitation, chest discomfort  Gastrointestinal:  Denies nausea, heartburn or change in bowel habits Skin: Denies abnormal skin rashes Lymphatics: Denies new lymphadenopathy or easy bruising Neurological:Denies numbness, tingling or new weaknesses Behavioral/Psych: Mood is stable, no new changes  All other systems were reviewed with the patient and are negative.  I have reviewed the past medical history, past surgical history, social history and family history with the patient and they are unchanged from previous note.  ALLERGIES:  is allergic to bendamustine hcl; heparin; and tape.  MEDICATIONS:  Current Outpatient Prescriptions  Medication Sig Dispense Refill  . albuterol (PROVENTIL) (2.5 MG/3ML) 0.083% nebulizer solution Take 3 mLs (2.5 mg total) by nebulization every 2 (two) hours as needed for wheezing. 75 mL 2  . ANORO ELLIPTA 62.5-25 MCG/INH AEPB as needed.    Marland Kitchen aspirin EC 81 MG tablet Take 81 mg by mouth daily.    . clonazePAM (KLONOPIN) 0.5 MG tablet TAKE 1 TABLET BY MOUTH TWICE A DAY AS NEEDED FOR ANXIETY or at hs for insomnia 180 tablet 0  . clopidogrel (PLAVIX) 75 MG tablet TAKE 1 TABLET BY MOUTH DAILY WITH BREAKFAST. 90 tablet 3  . oxyCODONE-acetaminophen (PERCOCET/ROXICET) 5-325 MG per tablet Take 1 tablet by mouth every 6 (six) hours as needed for moderate pain. 120 tablet 0  . potassium chloride SA (KLOR-CON M20) 20 MEQ tablet TAKE 1 TABLET BY MOUTH DAILY 90 tablet 3  . simvastatin (ZOCOR) 20 MG tablet TAKE 1 TABLET (20 MG TOTAL) BY MOUTH AT BEDTIME. 90 tablet 3  . torsemide (  DEMADEX) 100 MG tablet Take 100 mg by mouth daily.    . nitroGLYCERIN (NITROSTAT) 0.4 MG SL tablet Place 1 tablet (0.4 mg total) under the tongue every 5 (five) minutes as needed for chest pain (Up to 3  doses). (Patient not taking: Reported on 04/08/2015) 25 tablet 4  . predniSONE (DELTASONE) 10 MG tablet TAKE 1 TABLET EVERY DAY (Patient not taking: Reported on 03/30/2015) 30 tablet 0  . predniSONE (DELTASONE) 20 MG tablet Take 2 tabs (40 mg) PO QD x 3 days only. 12 tablet 4  . SANTYL ointment every other day.    . tretinoin (RETIN-A) 0.025 % cream Apply 1 application topically as needed.     . triamcinolone ointment (KENALOG) 0.1 % Apply topically 3 (three) times daily. (Patient not taking: Reported on 04/08/2015) 90 g 3   No current facility-administered medications for this visit.   Facility-Administered Medications Ordered in Other Visits  Medication Dose Route Frequency Provider Last Rate Last Dose  . anticoagulant sodium citrate solution 5 mL  5 mL Intravenous Once PRN Heath Lark, MD      . sodium chloride 0.9 % injection 10 mL  10 mL Intracatheter PRN Heath Lark, MD        PHYSICAL EXAMINATION: ECOG PERFORMANCE STATUS: 2 - Symptomatic, <50% confined to bed  Filed Vitals:   04/08/15 1006  BP: 126/37  Pulse: 86  Temp: 98.2 F (36.8 C)  Resp: 18   Filed Weights   04/08/15 1006  Weight: 366 lb 11.2 oz (166.334 kg)    GENERAL:alert, no distress and comfortable. He is morbidly obese SKIN: skin color, texture, turgor are normal, no rashes or significant lesions EYES: normal, Conjunctiva are pink and non-injected, sclera clear OROPHARYNX:no exudate, no erythema and lips, buccal mucosa, and tongue normal  NECK: supple, thyroid normal size, non-tender, without nodularity LYMPH:  no palpable lymphadenopathy in the cervical, axillary or inguinal LUNGS:  Bilateral crackles are noted with wheezes with normal breathing effort HEART: regular rate & rhythm and no murmurs  With moderate bilateral lower extremity edema ABDOMEN:abdomen soft, non-tender and normal bowel sounds Musculoskeletal:no cyanosis of digits and no clubbing  NEURO: alert & oriented x 3 with fluent speech, no focal  motor/sensory deficits  LABORATORY DATA:  I have reviewed the data as listed    Component Value Date/Time   NA 141 04/08/2015 0943   NA 139 01/27/2015 1524   K 3.8 04/08/2015 0943   K 3.7 01/27/2015 1524   CL 99 01/27/2015 1524   CO2 36* 04/08/2015 0943   CO2 36* 01/27/2015 1524   GLUCOSE 123 04/08/2015 0943   GLUCOSE 99 01/27/2015 1524   BUN 12.8 04/08/2015 0943   BUN 25* 01/27/2015 1524   CREATININE 0.9 04/08/2015 0943   CREATININE 1.05 01/27/2015 1524   CALCIUM 9.0 04/08/2015 0943   CALCIUM 9.2 01/27/2015 1524   PROT 6.5 04/08/2015 0943   PROT 6.6 01/15/2015 0500   ALBUMIN 3.4* 04/08/2015 0943   ALBUMIN 3.1* 01/15/2015 0500   AST 13 04/08/2015 0943   AST 22 01/15/2015 0500   ALT 17 04/08/2015 0943   ALT 27 01/15/2015 0500   ALKPHOS 85 04/08/2015 0943   ALKPHOS 57 01/15/2015 0500   BILITOT 0.38 04/08/2015 0943   BILITOT 0.6 01/15/2015 0500   GFRNONAA >60 01/18/2015 0320   GFRAA >60 01/18/2015 0320    No results found for: SPEP, UPEP  Lab Results  Component Value Date   WBC 8.8 04/08/2015   NEUTROABS 6.3 04/08/2015  HGB 13.3 04/08/2015   HCT 42.1 04/08/2015   MCV 104.2* 04/08/2015   PLT 204 04/08/2015      Chemistry      Component Value Date/Time   NA 141 04/08/2015 0943   NA 139 01/27/2015 1524   K 3.8 04/08/2015 0943   K 3.7 01/27/2015 1524   CL 99 01/27/2015 1524   CO2 36* 04/08/2015 0943   CO2 36* 01/27/2015 1524   BUN 12.8 04/08/2015 0943   BUN 25* 01/27/2015 1524   CREATININE 0.9 04/08/2015 0943   CREATININE 1.05 01/27/2015 1524      Component Value Date/Time   CALCIUM 9.0 04/08/2015 0943   CALCIUM 9.2 01/27/2015 1524   ALKPHOS 85 04/08/2015 0943   ALKPHOS 57 01/15/2015 0500   AST 13 04/08/2015 0943   AST 22 01/15/2015 0500   ALT 17 04/08/2015 0943   ALT 27 01/15/2015 0500   BILITOT 0.38 04/08/2015 0943   BILITOT 0.6 01/15/2015 0500     ASSESSMENT & PLAN:  Follicular lymphoma grade 3a His bilateral leg cellulitis has improved  although not completely resolved. He tolerated recent treatment without any side effects. I will continue to prescribe reduce dose prednisone for 3 days only after each cycle I will see him back in 3 weeks prior to cycle 3 of therapy. I plan to restage with PET CT scan after this cycle of treatment.  Bilateral lower extremity edema This is related to morbid obesity and chronic congestive heart failure. He will continue diuretic therapy as directed.   Cellulitis of right lower extremity  His lower extremity wound in the left leg has improved. On the right leg, unfortunately, there is still persistent, slow healing wound. He will local wound care   Chronic diastolic CHF (congestive heart failure) The patient have evidence of mild fluid overload. As mentioned above, I will give him reduce dosed prednisone to reduce the risk of fluid retention.  COPD mixed type  He has chronic shortness of breath and remained on multiple inhalers. We will continue current treatment  Basal cell carcinoma of back He had recent basal cell carcinoma removed from his back. According to the patient, it was just a biopsy. I recommend he consult with his dermatologist for full resection   Orders Placed This Encounter  Procedures  . NM PET Image Restag (PS) Skull Base To Thigh    Standing Status: Future     Number of Occurrences:      Standing Expiration Date: 06/07/2016    Order Specific Question:  Reason for Exam (SYMPTOM  OR DIAGNOSIS REQUIRED)    Answer:  staging lymphoma assess response to Rx    Order Specific Question:  Preferred imaging location?    Answer:  [facility]   All questions were answered. The patient knows to call the clinic with any problems, questions or concerns. No barriers to learning was detected. I spent 25 minutes counseling the patient face to face. The total time spent in the appointment was 40 minutes and more than 50% was on counseling and review of test  results     [facility], Ginny Loomer, MD 04/08/2015 5:02 PM

## 2015-04-08 NOTE — Assessment & Plan Note (Signed)
This is related to morbid obesity and chronic congestive heart failure. He will continue diuretic therapy as directed.

## 2015-04-08 NOTE — Assessment & Plan Note (Signed)
The patient have evidence of mild fluid overload. As mentioned above, I will give him reduce dosed prednisone to reduce the risk of fluid retention.

## 2015-04-08 NOTE — Assessment & Plan Note (Signed)
He had recent basal cell carcinoma removed from his back. According to the patient, it was just a biopsy. I recommend he consult with his dermatologist for full resection

## 2015-04-08 NOTE — Assessment & Plan Note (Signed)
His bilateral leg cellulitis has improved although not completely resolved. He tolerated recent treatment without any side effects. I will continue to prescribe reduce dose prednisone for 3 days only after each cycle I will see him back in 3 weeks prior to cycle 3 of therapy. I plan to restage with PET CT scan after this cycle of treatment.

## 2015-04-08 NOTE — Assessment & Plan Note (Signed)
His lower extremity wound in the left leg has improved. On the right leg, unfortunately, there is still persistent, slow healing wound. He will local wound care

## 2015-04-08 NOTE — Telephone Encounter (Signed)
per pof to sch pt appt-sent MW emailt o sch pt trmt-gave pt copy of avs-pt aware of appt-adv pt [facility] wold call and sch scan

## 2015-04-08 NOTE — Patient Instructions (Signed)

## 2015-04-09 DIAGNOSIS — L97212 Non-pressure chronic ulcer of right calf with fat layer exposed: Secondary | ICD-10-CM | POA: Diagnosis not present

## 2015-04-12 ENCOUNTER — Other Ambulatory Visit (HOSPITAL_COMMUNITY): Payer: Self-pay | Admitting: Plastic Surgery

## 2015-04-12 ENCOUNTER — Ambulatory Visit (INDEPENDENT_AMBULATORY_CARE_PROVIDER_SITE_OTHER)
Admission: RE | Admit: 2015-04-12 | Discharge: 2015-04-12 | Disposition: A | Discharge status: Home / Self Care | Payer: BLUE CROSS/BLUE SHIELD | Source: Ambulatory Visit | Attending: Surgery | Admitting: Surgery

## 2015-04-12 ENCOUNTER — Ambulatory Visit (HOSPITAL_COMMUNITY)
Admission: RE | Admit: 2015-04-12 | Discharge: 2015-04-12 | Disposition: A | Discharge status: Home / Self Care | Payer: BLUE CROSS/BLUE SHIELD | Source: Ambulatory Visit | Attending: Surgery | Admitting: Surgery

## 2015-04-12 DIAGNOSIS — I87313 Chronic venous hypertension (idiopathic) with ulcer of bilateral lower extremity: Secondary | ICD-10-CM

## 2015-04-12 DIAGNOSIS — I890 Lymphedema, not elsewhere classified: Secondary | ICD-10-CM | POA: Diagnosis not present

## 2015-04-12 DIAGNOSIS — E11621 Type 2 diabetes mellitus with foot ulcer: Secondary | ICD-10-CM

## 2015-04-12 DIAGNOSIS — L97509 Non-pressure chronic ulcer of other part of unspecified foot with unspecified severity: Secondary | ICD-10-CM

## 2015-04-12 DIAGNOSIS — L97929 Non-pressure chronic ulcer of unspecified part of left lower leg with unspecified severity: Principal | ICD-10-CM

## 2015-04-12 DIAGNOSIS — L97212 Non-pressure chronic ulcer of right calf with fat layer exposed: Secondary | ICD-10-CM | POA: Diagnosis not present

## 2015-04-12 DIAGNOSIS — IMO0001 Reserved for inherently not codable concepts without codable children: Secondary | ICD-10-CM

## 2015-04-12 DIAGNOSIS — L97919 Non-pressure chronic ulcer of unspecified part of right lower leg with unspecified severity: Principal | ICD-10-CM

## 2015-04-12 DIAGNOSIS — J449 Chronic obstructive pulmonary disease, unspecified: Secondary | ICD-10-CM | POA: Diagnosis not present

## 2015-04-12 DIAGNOSIS — I10 Essential (primary) hypertension: Secondary | ICD-10-CM | POA: Diagnosis not present

## 2015-04-14 ENCOUNTER — Encounter: Payer: Self-pay | Admitting: Cardiovascular Disease

## 2015-04-14 ENCOUNTER — Ambulatory Visit (INDEPENDENT_AMBULATORY_CARE_PROVIDER_SITE_OTHER): Payer: BLUE CROSS/BLUE SHIELD | Admitting: Cardiovascular Disease

## 2015-04-14 VITALS — BP 126/60 | HR 70 | Ht 72.0 in | Wt 348.1 lb

## 2015-04-14 DIAGNOSIS — I739 Peripheral vascular disease, unspecified: Secondary | ICD-10-CM

## 2015-04-14 DIAGNOSIS — I10 Essential (primary) hypertension: Secondary | ICD-10-CM

## 2015-04-14 DIAGNOSIS — I779 Disorder of arteries and arterioles, unspecified: Secondary | ICD-10-CM

## 2015-04-14 NOTE — Patient Instructions (Signed)
Medication Instructions:  Your physician recommends that you continue on your current medications as directed. Please refer to the Current Medication list given to you today.   Labwork: NONE Testing/Procedures: Your physician has requested that you have a carotid duplex. This test is an ultrasound of the carotid arteries in your neck. It looks at blood flow through these arteries that supply the brain with blood. Allow one hour for this exam. There are no restrictions or special instructions. DUE IN  FEBRUARY  Follow-Up: Your physician wants you to follow-up in:  [place] will receive a reminder letter in the mail two months in advance. If you don't receive a letter, please call our office to schedule the follow-up appointment.   Any Other Special Instructions Will Be Listed Below (If Applicable).

## 2015-04-14 NOTE — Progress Notes (Signed)
Patient ID: [name], male   DOB: 1955/11/06, 59 y.o.   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 He was evaluated recently for chest discomfort and a Myoview scan demonstrated inferolateral infarct and ischemia. Cardiac catheterization was arranged.  LHC 09/29/11: Distal left main 30%, ostial LAD 90%, LIMA-LAD patent, proximal RI occluded, ostial SVG-RI 95%, proximal RCA occluded, proximal SVG-PDA 95%, EF 55%. The patient underwent PCI with Dr. Burt Knack on the same date: Promus DES to the SVG-RI and Promus DES to the SVG-PDA. Labs: Hemoglobin 15.9, potassium 4.7, creatinine 0.4.  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 Melanoma on back with biopsy   ROS: Denies fever, malais, weight loss, blurry vision, decreased visual acuity, cough, sputum, SOB, hemoptysis, pleuritic pain, palpitaitons, heartburn, abdominal pain, melena, lower extremity edema, claudication, or rash.  All other systems reviewed and negative  General: Affect appropriate Obese male  HEENT: huge firm nodular mass in left supraclavicular fossa posterior to CEA scar  Neck supple with no adenopathy JVP normal left  bruits no thyromegaly left supraclavicular adenopathy Lungs clear with no wheezing and good diaphragmatic motion Heart:  S1/S2 SEM  murmur, no rub, gallop or click PMI normal Abdomen: benighn, BS positve, no tenderness, no AAA no bruit.  No HSM or HJR Distal pulses intact with no bruits Plus 2 bilateral  Edema with dressings on  Neuro non-focal Skin warm and dry No muscular weakness   Current Outpatient Prescriptions  Medication Sig Dispense Refill  . albuterol (PROVENTIL) (2.5 MG/3ML) 0.083% nebulizer solution Take 3 mLs (2.5 mg total) by nebulization every 2  (two) hours as needed for wheezing. 75 mL 2  . ANORO ELLIPTA 62.5-25 MCG/INH AEPB Inhale 1 puff into the lungs daily as needed. For wheezing & SOB    . aspirin EC 81 MG tablet Take 81 mg by mouth daily.    . clonazePAM (KLONOPIN) 0.5 MG tablet TAKE 1 TABLET BY MOUTH TWICE A DAY AS NEEDED FOR ANXIETY or at hs for insomnia 180 tablet 0  . clopidogrel (PLAVIX) 75 MG tablet TAKE 1 TABLET BY MOUTH DAILY WITH BREAKFAST. 90 tablet 3  . metoprolol succinate (TOPROL-XL) 50 MG 24 hr tablet Take 100 mg by mouth daily.    . nitroGLYCERIN (NITROSTAT) 0.4 MG SL tablet Place 1 tablet (0.4 mg total) under the tongue every 5 (five) minutes as needed for chest pain (Up to 3 doses). 25 tablet 4  . oxyCODONE-acetaminophen (PERCOCET/ROXICET) 5-325 MG per tablet Take 1 tablet by mouth every 6 (six) hours as needed for moderate pain. 120 tablet 0  . potassium chloride SA (KLOR-CON M20) 20 MEQ tablet TAKE 1 TABLET BY MOUTH DAILY 90 tablet 3  . simvastatin (ZOCOR) 20 MG tablet TAKE 1 TABLET (20 MG TOTAL) BY MOUTH AT BEDTIME. 90 tablet 3  . torsemide (DEMADEX) 100 MG tablet Take 100 mg by mouth daily.     No current facility-administered medications for this visit.    Allergies  Bendamustine hcl; Heparin; and Tape  Electrocardiogram:  06/27/13  SR rate 60  RBBB  Today SR rate 68  ICRBBB LAE    Assessment and Plan CAD:  CABG 2005  Cath 2013 with stent to SVG IM and SVG PDA no angina stable continue medical Rx Edema:  Related to obesity and chemo  recent cellulitis  F/u primary and wound center continue wraps Lymphoma:  F/u oncology PET scan next month continue chemo  Still with left supraclavicular adenopathy Chol:   Lab Results  Component Value Date   LDLCALC 70 03/25/2014   Bruit:  Post Left CEA with residual 40-59% bilateral disease ASA  F/u duplex in 6 months

## 2015-04-20 ENCOUNTER — Encounter (HOSPITAL_BASED_OUTPATIENT_CLINIC_OR_DEPARTMENT_OTHER): Payer: BLUE CROSS/BLUE SHIELD | Attending: Plastic Surgery

## 2015-04-20 DIAGNOSIS — I87313 Chronic venous hypertension (idiopathic) with ulcer of bilateral lower extremity: Secondary | ICD-10-CM | POA: Insufficient documentation

## 2015-04-20 DIAGNOSIS — C8221 Follicular lymphoma grade III, unspecified, lymph nodes of head, face, and neck: Secondary | ICD-10-CM | POA: Diagnosis not present

## 2015-04-20 DIAGNOSIS — I252 Old myocardial infarction: Secondary | ICD-10-CM | POA: Diagnosis not present

## 2015-04-20 DIAGNOSIS — L98491 Non-pressure chronic ulcer of skin of other sites limited to breakdown of skin: Secondary | ICD-10-CM | POA: Insufficient documentation

## 2015-04-20 DIAGNOSIS — I10 Essential (primary) hypertension: Secondary | ICD-10-CM | POA: Diagnosis not present

## 2015-04-20 DIAGNOSIS — L97211 Non-pressure chronic ulcer of right calf limited to breakdown of skin: Secondary | ICD-10-CM | POA: Diagnosis present

## 2015-04-20 DIAGNOSIS — E11622 Type 2 diabetes mellitus with other skin ulcer: Secondary | ICD-10-CM | POA: Insufficient documentation

## 2015-04-20 DIAGNOSIS — Z9221 Personal history of antineoplastic chemotherapy: Secondary | ICD-10-CM | POA: Diagnosis not present

## 2015-04-20 DIAGNOSIS — L97321 Non-pressure chronic ulcer of left ankle limited to breakdown of skin: Secondary | ICD-10-CM | POA: Diagnosis not present

## 2015-04-20 DIAGNOSIS — J449 Chronic obstructive pulmonary disease, unspecified: Secondary | ICD-10-CM | POA: Insufficient documentation

## 2015-04-20 DIAGNOSIS — E1140 Type 2 diabetes mellitus with diabetic neuropathy, unspecified: Secondary | ICD-10-CM | POA: Diagnosis not present

## 2015-04-20 DIAGNOSIS — I509 Heart failure, unspecified: Secondary | ICD-10-CM | POA: Diagnosis not present

## 2015-04-20 DIAGNOSIS — I890 Lymphedema, not elsewhere classified: Secondary | ICD-10-CM | POA: Diagnosis not present

## 2015-04-20 DIAGNOSIS — I2510 Atherosclerotic heart disease of native coronary artery without angina pectoris: Secondary | ICD-10-CM | POA: Insufficient documentation

## 2015-04-20 DIAGNOSIS — H269 Unspecified cataract: Secondary | ICD-10-CM | POA: Diagnosis not present

## 2015-04-21 ENCOUNTER — Telehealth: Payer: Self-pay | Admitting: Internal Medicine

## 2015-04-21 MED ORDER — OXYCODONE-ACETAMINOPHEN 5-325 MG PO TABS: 1.0000 | ORAL_TABLET | Freq: Four times a day (QID) | ORAL | Status: DC | PRN

## 2015-04-21 NOTE — Telephone Encounter (Signed)
OK to fill this prescription with additional refills x0 Thank you!  

## 2015-04-21 NOTE — Telephone Encounter (Signed)
Notified pt rx ready for pick-up.../lmb 

## 2015-04-21 NOTE — Telephone Encounter (Signed)
Patient requesting prescription for oxyCODONE-acetaminophen (PERCOCET/ROXICET) 5-325 MG per tablet [[id number]]

## 2015-04-22 ENCOUNTER — Telehealth: Payer: Self-pay | Admitting: *Deleted

## 2015-04-22 ENCOUNTER — Other Ambulatory Visit: Payer: Self-pay | Admitting: *Deleted

## 2015-04-22 DIAGNOSIS — C8230 Follicular lymphoma grade IIIa, unspecified site: Secondary | ICD-10-CM

## 2015-04-22 DIAGNOSIS — J449 Chronic obstructive pulmonary disease, unspecified: Secondary | ICD-10-CM

## 2015-04-22 NOTE — Telephone Encounter (Signed)
PT. HAS HAD CHEST AND HEAD COLD FOR ABOUT ONE WEEK. HE DOES NOT HAVE A FEVER. PT.'S COUGH PRODUCES CLEAR-WHITE SPUTUM AND HIS NASAL DRAINAGE IS ALSO CLEAR- WHITE. NO CHEST PAIN OR PAIN WITH INSPIRATION. HE HAS BEEN USING O2 AT TWO LITERS AT BEDTIME. HIS SHORTNESS OF BREATH IS WITH EXERTION. HE IS VERY WEAK. PT. HAS "PUS POCKETS POPPING UP ALL OVER HIS BODY". HE STATES "LOOKS LIKE PIMPLES ON HIS LEGS, ARMS, HANDS, STOMACH, AND BACK. VERBAL ORDER AND READ BACK TO CYNDEE BACON,NP-IF PT.'S CONDITION WORSEN HE NEEDS TO GO TO THE EMERGENCY DEPARTMENT FOR AN EVALUATION. OTHERWISE PT. TO COME TO THE OFFICE TOMORROW FOR LAB AND SEE CYNDEE. NOTIFIED PT. OF THE ABOVE INSTRUCTIONS. HE WILL COME FOR LAB AT 10:00AM AND SEE CYNDEE AT 10:45AM.

## 2015-04-23 ENCOUNTER — Other Ambulatory Visit (HOSPITAL_BASED_OUTPATIENT_CLINIC_OR_DEPARTMENT_OTHER): Payer: BLUE CROSS/BLUE SHIELD

## 2015-04-23 ENCOUNTER — Ambulatory Visit (HOSPITAL_BASED_OUTPATIENT_CLINIC_OR_DEPARTMENT_OTHER): Payer: BLUE CROSS/BLUE SHIELD | Admitting: Nurse Practitioner

## 2015-04-23 VITALS — BP 166/57 | HR 74 | Temp 98.6°F | Resp 26 | Ht 72.0 in | Wt 345.9 lb

## 2015-04-23 DIAGNOSIS — C8230 Follicular lymphoma grade IIIa, unspecified site: Secondary | ICD-10-CM | POA: Diagnosis not present

## 2015-04-23 DIAGNOSIS — J069 Acute upper respiratory infection, unspecified: Secondary | ICD-10-CM

## 2015-04-23 DIAGNOSIS — R21 Rash and other nonspecific skin eruption: Secondary | ICD-10-CM | POA: Diagnosis not present

## 2015-04-23 DIAGNOSIS — J449 Chronic obstructive pulmonary disease, unspecified: Secondary | ICD-10-CM

## 2015-04-23 LAB — COMPREHENSIVE METABOLIC PANEL (CC13)
ALBUMIN: 3.6 g/dL (ref 3.5–5.0)
ALT: 17 U/L (ref 0–55)
AST: 15 U/L (ref 5–34)
Alkaline Phosphatase: 107 U/L (ref 40–150)
Anion Gap: 8 mEq/L (ref 3–11)
BILIRUBIN TOTAL: 0.37 mg/dL (ref 0.20–1.20)
BUN: 14.5 mg/dL (ref 7.0–26.0)
CALCIUM: 9.7 mg/dL (ref 8.4–10.4)
CO2: 39 meq/L — AB (ref 22–29)
Chloride: 98 mEq/L (ref 98–109)
Creatinine: 1.1 mg/dL (ref 0.7–1.3)
EGFR: 70 mL/min/{1.73_m2} — ABNORMAL LOW (ref >90)
GLUCOSE: 118 mg/dL (ref 70–140)
Potassium: 4.2 mEq/L (ref 3.5–5.1)
Sodium: 146 mEq/L — ABNORMAL HIGH (ref 136–145)
TOTAL PROTEIN: 6.8 g/dL (ref 6.4–8.3)

## 2015-04-23 LAB — CBC WITH DIFFERENTIAL/PLATELET
BASO%: 1.3 % (ref 0.0–2.0)
Basophils Absolute: 0.1 10*3/uL (ref 0.0–0.1)
EOS%: 1.6 % (ref 0.0–7.0)
Eosinophils Absolute: 0.2 10*3/uL (ref 0.0–0.5)
HEMATOCRIT: 45.1 % (ref 38.4–49.9)
HEMOGLOBIN: 14.8 g/dL (ref 13.0–17.1)
LYMPH#: 0.8 10*3/uL — AB (ref 0.9–3.3)
LYMPH%: 7.7 % — ABNORMAL LOW (ref 14.0–49.0)
MCH: 32.8 pg (ref 27.2–33.4)
MCHC: 32.9 g/dL (ref 32.0–36.0)
MCV: 99.5 fL — ABNORMAL HIGH (ref 79.3–98.0)
MONO#: 1.6 10*3/uL — ABNORMAL HIGH (ref 0.1–0.9)
MONO%: 16 % — ABNORMAL HIGH (ref 0.0–14.0)
NEUT%: 73.4 % (ref 39.0–75.0)
NEUTROS ABS: 7.1 10*3/uL — AB (ref 1.5–6.5)
Platelets: 220 10*3/uL (ref 140–400)
RBC: 4.53 10*6/uL (ref 4.20–5.82)
RDW: 15 % — AB (ref 11.0–14.6)
WBC: 9.7 10*3/uL (ref 4.0–10.3)

## 2015-04-23 MED ORDER — DOXYCYCLINE HYCLATE 100 MG PO TABS: 100.0000 mg | ORAL_TABLET | Freq: Two times a day (BID) | ORAL | Status: DC

## 2015-04-26 ENCOUNTER — Encounter: Payer: Self-pay | Admitting: Nurse Practitioner

## 2015-04-26 DIAGNOSIS — J069 Acute upper respiratory infection, unspecified: Secondary | ICD-10-CM | POA: Insufficient documentation

## 2015-04-26 DIAGNOSIS — R21 Rash and other nonspecific skin eruption: Secondary | ICD-10-CM | POA: Insufficient documentation

## 2015-04-26 NOTE — Assessment & Plan Note (Signed)
Patient is complaining of some upper respiratory infection type symptoms which include some mild, chronic shortness of breath and a congested cough.  He denies any productive cough, however.  He also denies any recent fevers or chills.  Patient suffers with chronic shortness of breath; and has home O2 as well as home nebulizers.  He does not like to wear his home O2; so typically his O2 saturation is in the mid to upper 80s.  On exam today.  Patient noted to have some bilateral wheezes; but no obvious cough or shortness of breath.  O2 sat on initial check at the cancer center was 84% on room air.  Patient had O2 tank with kidney sitting beside him; but refused to use it.  Patient was offered a nebulizer treatment to improve wheezing; but patient refused.  Since patient does appear nontoxic-will prescribe doxycycline for treatment of his rash; but the antibiotic will also help for with any URI/bronchitis symptoms.  Patient was advised to go home and give himself a nebulizer treatment.  Also encouraged patient to use his home O2 as needed.  Patient and his wife were both advised to go directly to the emergency department for any worsening symptoms whatsoever.

## 2015-04-26 NOTE — Assessment & Plan Note (Signed)
Patient received his last cycle of vincristine/Cytoxan/Rituxan on 04/08/2015.  Patient is scheduled for labs and a restaging PET scan on 04/29/2015.  Patient is scheduled for a follow-up visit to review scan results on 04/30/2015.

## 2015-04-26 NOTE — Progress Notes (Signed)
SYMPTOM MANAGEMENT CLINIC   HPI: [name] 59 y.o. male diagnosed with  follicular lymphoma.  Currently undergoing vincristine/Cytoxan/Rituxan chemotherapy regimen.  Patient reports some URI symptoms which include a nonproductive cough and occasional shortness of breath.  Patient has home O2 and home nebulizer treatments to use; but states that he prefers not to.  He denies any recent fevers or chills.  Patient has developed a tiny partial her rash to various areas of his body.  Patient has a pustule to one of his hands; and to several areas of his bilateral forearms.  Patient also has chronic bilateral peripheral edema and skin breakdown.  He goes to the wound clinic for his skin care.  He just had his legs wrapped per wound care clinic earlier today.  HPI  ROS  Past Medical History  Diagnosis Date  . Heparin induced thrombocytopenia   . Hypertension   . Hyperlipidemia   . Obesity   . Edema   . COPD (chronic obstructive pulmonary disease)   . GERD (gastroesophageal reflux disease)   . Basal cell carcinoma of nose   . Chronic lower back pain   . Depression   . Myocardial infarction   . Heart murmur   . Dysrhythmia     fluttering  . Anginal pain     not had to use in awhile  none in 10 years  . CHF (congestive heart failure)   . Shortness of breath dyspnea     with exertion   . Anxiety   . Blood dyscrasia     trouble clotting   . Coronary artery disease     CABG 2005. s/p PTCA and stenting of the saphenous vein graft to PDA and saphenous vein graft to obtuse marginal by Dr. Burt Knack 09/29/11. Normal EF at cath 09/2011  . Sleep apnea     "used to"      could not use 2006  . Carotid artery disease     s/p L CEA 2009 (hx of evacuation of hematoma due to heparin)  . Lymphoma 12/21/2014  . Diabetes mellitus without complication     borderline   . History of home oxygen therapy     2 liters at night prn    Past Surgical History  Procedure Laterality Date  .  Evacuation of hematoma  03/2008    left neck; S/P endarterectomy; "cause heparin clotted it up"  . Coronary angioplasty with stent placement  09/29/11    "2"  . Carotid endarterectomy  03/2008    left  . Coronary artery bypass graft  2005    CABG X3  . Basal cell carcinoma excision  2000's    nose  . Percutaneous coronary stent intervention (pci-s) N/A 09/29/2011    Procedure: PERCUTANEOUS CORONARY STENT INTERVENTION (PCI-S);  Surgeon: Sherren Mocha, MD;  Location: [facility] CATH LAB;  Service: Cardiovascular;  Laterality: N/A;  . Mass excision Left 12/11/2014    Procedure: EXCISIONAL BIOPSY OF LEFT SUPRA CLAVICULAR NECK MASS;  Surgeon: Jerrell Belfast, MD;  Location: [place];  Service: ENT;  Laterality: Left;  . Superficial lymph node biopsy / excision Left   . Portacath placement  2016  . 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; Anxiety state, unspecified; Erectile dysfunction; Insomnia; Neck mass; Neoplasm of uncertain behavior of skin; Mass of neck; Morbid obesity; Diabetes mellitus type 2, controlled; Depression; OSA (obstructive sleep apnea); Follicular lymphoma grade 3a; Asymptomatic hyperuricemia; Hypersensitivity reaction; Bilateral lower extremity edema; Cellulitis of right lower extremity; Erythroderma; Purpura; RMSF [facility] spotted fever); Chronic diastolic CHF (congestive heart failure); Thrombocytopenia; Coronary artery disease involving coronary bypass graft of native heart without angina pectoris;  Vasculitis; Cellulitis of hand, left; Well adult exam; Basal cell carcinoma of back; URI (upper respiratory infection); and Rash on his problem list.    is allergic to bendamustine hcl; heparin; and tape.    Medication List       This list is accurate as of: 04/23/15 11:59 PM.  Always use your most recent med list.               albuterol (2.5 MG/3ML) 0.083% nebulizer solution  Commonly known as:  PROVENTIL  Take 3 mLs (2.5 mg total) by nebulization every 2 (two) hours as needed for wheezing.     ANORO ELLIPTA 62.5-25 MCG/INH Aepb  Generic drug:  Umeclidinium-Vilanterol  Inhale 1 puff into the lungs daily as needed. For wheezing & SOB     aspirin EC 81 MG tablet  Take 81 mg by mouth daily.     clonazePAM 0.5 MG tablet  Commonly known as:  KLONOPIN  TAKE 1 TABLET BY MOUTH TWICE A DAY AS NEEDED FOR ANXIETY or at hs for insomnia     clopidogrel 75 MG tablet  Commonly known as:  PLAVIX  TAKE 1 TABLET BY MOUTH DAILY WITH BREAKFAST.     doxycycline 100 MG tablet  Commonly known as:  VIBRA-TABS  Take 1 tablet (100 mg total) by mouth 2 (two) times daily.     metoprolol succinate 50 MG 24 hr tablet  Commonly known as:  TOPROL-XL  Take 100 mg by mouth daily.     nitroGLYCERIN 0.4 MG SL tablet  Commonly known as:  NITROSTAT  Place 1 tablet (0.4 mg total) under the tongue every 5 (five) minutes as needed for chest pain (Up to 3 doses).     oxyCODONE-acetaminophen 5-325 MG per tablet  Commonly known as:  PERCOCET/ROXICET  Take 1 tablet by mouth every 6 (six) hours as needed for moderate pain.     potassium chloride SA 20 MEQ tablet  Commonly known as:  KLOR-CON M20  TAKE 1 TABLET BY MOUTH DAILY     simvastatin 20 MG tablet  Commonly known as:  ZOCOR  TAKE 1 TABLET (20 MG TOTAL) BY MOUTH AT BEDTIME.     torsemide 100 MG tablet  Commonly known as:  DEMADEX  Take 100 mg by mouth daily.         PHYSICAL EXAMINATION  Oncology Vitals 04/23/2015 04/14/2015 04/08/2015 04/08/2015  04/08/2015 04/08/2015 04/08/2015  Height 183 cm 183 cm - - - - -  Weight 156.899 kg 157.906 kg - - - - -  Weight (lbs) 345 lbs 14 oz 348 lbs 2 oz - - - - -  BMI (kg/m2) 46.91 kg/m2 47.21 kg/m2 - - - - -  Temp 98.6 - 98.5 98.1 98.7 98.7 98.4  Pulse 74 70 72 72 70 65 65  Resp 26 - _0 SpO2 84 - - 92 90 92  92  BSA (m2) 2.82 m2 2.83 m2 - - - - -   BP Readings from Last 3 Encounters:  04/23/15 166/57  04/14/15 126/60  04/08/15 129/54    Physical Exam  Constitutional: He is oriented to person, place, and time and well-developed, well-nourished, and in no distress.  HENT:  Head: Normocephalic and atraumatic.  Mouth/Throat: Oropharynx is clear and moist.  Eyes: Conjunctivae and EOM are normal. Pupils are equal, round, and reactive to light. Right eye exhibits no discharge. Left eye exhibits no discharge. No scleral icterus.  Neck: Normal range of motion. Neck supple. No JVD present. No tracheal deviation present. No thyromegaly present.  Cardiovascular: Normal rate, regular rhythm, normal heart sounds and intact distal pulses.   Pulmonary/Chest: Effort normal. No respiratory distress. He has wheezes. He has no rales. He exhibits no tenderness.  Diminished bases bilaterally; with slight wheeze.  No acute respiratory distress.  No coughing noted.  Abdominal: Soft. Bowel sounds are normal. He exhibits no distension and no mass. There is no tenderness. There is no rebound and no guarding.  Patient is obese.  Musculoskeletal: Normal range of motion. He exhibits edema. He exhibits no tenderness.  +2 edema to bilateral lower extremities.  Bilateral lower legs wrapped per Wound care clinic.  Lymphadenopathy:    He has no cervical adenopathy.  Neurological: He is alert and oriented to person, place, and time. Gait normal.  Skin: Skin is warm and dry. Rash noted. No erythema. No pallor.  Patient has developed a tiny partial her rash to various areas of his body.  Patient has a pustule to  one of his hands; and to several areas of his bilateral forearms.     Psychiatric: Affect normal.  Nursing note and vitals reviewed.   LABORATORY DATA:. Appointment on 04/23/2015  Component Date Value Ref Range Status  . WBC 04/23/2015 9.7  4.0 - 10.3 10e3/uL Final  . NEUT# 04/23/2015 7.1* 1.5 - 6.5 10e3/uL Final  . HGB 04/23/2015 14.8  13.0 - 17.1 g/dL Final  . HCT 04/23/2015 45.1  38.4 - 49.9 % Final  . Platelets 04/23/2015 220  140 - 400 10e3/uL Final  . MCV 04/23/2015 99.5* 79.3 - 98.0 fL Final  . MCH 04/23/2015 32.8  27.2 - 33.4 pg Final  . MCHC 04/23/2015 32.9  32.0 - 36.0 g/dL Final  . RBC 04/23/2015 4.53  4.20 - 5.82 10e6/uL Final  . RDW 04/23/2015 15.0* 11.0 - 14.6 % Final  . lymph# 04/23/2015 0.8* 0.9 - 3.3 10e3/uL Final  . MONO# 04/23/2015 1.6* 0.1 - 0.9 10e3/uL Final  . Eosinophils Absolute 04/23/2015 0.2  0.0 - 0.5 10e3/uL Final  . Basophils Absolute 04/23/2015 0.1  0.0 - 0.1 10e3/uL Final  . NEUT% 04/23/2015 73.4  39.0 - 75.0 % Final  . LYMPH% 04/23/2015 7.7* 14.0 - 49.0 % Final  . MONO% 04/23/2015 16.0* 0.0 - 14.0 % Final  . EOS% 04/23/2015 1.6  0.0 - 7.0 % Final  . BASO% 04/23/2015 1.3  0.0 - 2.0 % Final  . Sodium 04/23/2015 146* 136 - 145 mEq/L Final  . Potassium 04/23/2015 4.2  3.5 - 5.1 mEq/L Final  . Chloride 04/23/2015 98  98 - 109 mEq/L Final  . CO2 04/23/2015 39* 22 - 29 mEq/L Final  . Glucose 04/23/2015 118  70 - 140 mg/dl Final  . BUN 04/23/2015 14.5  7.0 - 26.0 mg/dL Final  . Creatinine 04/23/2015 1.1  0.7 - 1.3 mg/dL Final  . Total Bilirubin 04/23/2015  0.37  0.20 - 1.20 mg/dL Final  . Alkaline Phosphatase 04/23/2015 107  40 - 150 U/L Final  . AST 04/23/2015 15  5 - 34 U/L Final  . ALT 04/23/2015 17  0 - 55 U/L Final  . Total Protein 04/23/2015 6.8  6.4 - 8.3 g/dL Final  . Albumin 04/23/2015 3.6  3.5 - 5.0 g/dL Final  . Calcium 04/23/2015 9.7  8.4 - 10.4 mg/dL Final  . Anion Gap 04/23/2015 8  3 - 11 mEq/L Final  . EGFR 04/23/2015 70* >90  ml/min/1.73 m2 Final   eGFR is calculated using the CKD-EPI Creatinine Equation (2009)   Left ring finger:    Left forearm:   Right forearm:     RADIOGRAPHIC STUDIES: No results found.  ASSESSMENT/PLAN:    URI (upper respiratory infection) Patient is complaining of some upper respiratory infection type symptoms which include some mild, chronic shortness of breath and a congested cough.  He denies any productive cough, however.  He also denies any recent fevers or chills.  Patient suffers with chronic shortness of breath; and has home O2 as well as home nebulizers.  He does not like to wear his home O2; so typically his O2 saturation is in the mid to upper 80s.  On exam today.  Patient noted to have some bilateral wheezes; but no obvious cough or shortness of breath.  O2 sat on initial check at the cancer center was 84% on room air.  Patient had O2 tank with kidney sitting beside him; but refused to use it.  Patient was offered a nebulizer treatment to improve wheezing; but patient refused.  Since patient does appear nontoxic-will prescribe doxycycline for treatment of his rash; but the antibiotic will also help for with any URI/bronchitis symptoms.  Patient was advised to go home and give himself a nebulizer treatment.  Also encouraged patient to use his home O2 as needed.  Patient and his wife were both advised to go directly to the emergency department for any worsening symptoms whatsoever.    Rash Patient has developed a tiny partial her rash to various areas of his body.  Patient has a pustule to one of his hands; and to several areas of his bilateral forearms.  Patient also has chronic bilateral peripheral edema and skin breakdown.  He goes to the wound clinic for his skin care.  He just had his legs wrapped per wound care clinic earlier today.  Patient will be prescribed doxycycline for treatment of rash.  Quite possible that patient's pustular rash is secondary to his  chemotherapy.  Will monitor closely.  Follicular lymphoma grade 3a Patient received his last cycle of vincristine/Cytoxan/Rituxan on 04/08/2015.  Patient is scheduled for labs and a restaging PET scan on 04/29/2015.  Patient is scheduled for a follow-up visit to review scan results on 04/30/2015.  Patient stated understanding of all instructions; and was in agreement with this plan of care. The patient knows to call the clinic with any problems, questions or concerns.   Review/collaboration with Dr. Alvy Bimler regarding all aspects of patient's visit today.   Total time spent with patient was 25 minutes;  with greater than 75 percent of that time spent in face to face counseling regarding patient's symptoms,  and coordination of care and follow up.  Disclaimer:This dictation was prepared with Dragon/digital dictation along with [employer]. Any transcriptional errors that result from this process are unintentional.  Drue Second, NP 04/26/2015

## 2015-04-26 NOTE — Assessment & Plan Note (Signed)
Patient has developed a tiny partial her rash to various areas of his body.  Patient has a pustule to one of his hands; and to several areas of his bilateral forearms.  Patient also has chronic bilateral peripheral edema and skin breakdown.  He goes to the wound clinic for his skin care.  He just had his legs wrapped per wound care clinic earlier today.  Patient will be prescribed doxycycline for treatment of rash.  Quite possible that patient's pustular rash is secondary to his chemotherapy.  Will monitor closely.

## 2015-04-27 DIAGNOSIS — L97211 Non-pressure chronic ulcer of right calf limited to breakdown of skin: Secondary | ICD-10-CM | POA: Diagnosis not present

## 2015-04-27 DIAGNOSIS — E11622 Type 2 diabetes mellitus with other skin ulcer: Secondary | ICD-10-CM | POA: Diagnosis not present

## 2015-04-27 DIAGNOSIS — L97321 Non-pressure chronic ulcer of left ankle limited to breakdown of skin: Secondary | ICD-10-CM | POA: Diagnosis not present

## 2015-04-27 DIAGNOSIS — L98491 Non-pressure chronic ulcer of skin of other sites limited to breakdown of skin: Secondary | ICD-10-CM | POA: Diagnosis not present

## 2015-04-29 ENCOUNTER — Other Ambulatory Visit: Payer: BLUE CROSS/BLUE SHIELD

## 2015-04-29 ENCOUNTER — Ambulatory Visit (HOSPITAL_COMMUNITY)
Admission: RE | Admit: 2015-04-29 | Discharge: 2015-04-29 | Disposition: A | Discharge status: Home / Self Care | Payer: BLUE CROSS/BLUE SHIELD | Source: Ambulatory Visit | Attending: Hematology and Oncology | Admitting: Hematology and Oncology

## 2015-04-29 DIAGNOSIS — J929 Pleural plaque without asbestos: Secondary | ICD-10-CM | POA: Diagnosis not present

## 2015-04-29 DIAGNOSIS — C8230 Follicular lymphoma grade IIIa, unspecified site: Secondary | ICD-10-CM | POA: Insufficient documentation

## 2015-04-29 DIAGNOSIS — I517 Cardiomegaly: Secondary | ICD-10-CM | POA: Insufficient documentation

## 2015-04-29 DIAGNOSIS — N200 Calculus of kidney: Secondary | ICD-10-CM | POA: Insufficient documentation

## 2015-04-29 DIAGNOSIS — K8020 Calculus of gallbladder without cholecystitis without obstruction: Secondary | ICD-10-CM | POA: Diagnosis not present

## 2015-04-29 DIAGNOSIS — R160 Hepatomegaly, not elsewhere classified: Secondary | ICD-10-CM | POA: Diagnosis not present

## 2015-04-29 DIAGNOSIS — R234 Changes in skin texture: Secondary | ICD-10-CM | POA: Diagnosis not present

## 2015-04-29 LAB — GLUCOSE, CAPILLARY: Glucose-Capillary: 115 mg/dL — ABNORMAL HIGH (ref 65–99)

## 2015-04-29 MED ORDER — FLUDEOXYGLUCOSE F - 18 (FDG) INJECTION
15.7800 | Freq: Once | INTRAVENOUS | Status: DC | PRN
  Administered 2015-04-29: 15.78 via INTRAVENOUS
  Filled 2015-04-29: qty 15.78

## 2015-04-30 ENCOUNTER — Encounter: Payer: Self-pay | Admitting: Hematology and Oncology

## 2015-04-30 ENCOUNTER — Ambulatory Visit (HOSPITAL_BASED_OUTPATIENT_CLINIC_OR_DEPARTMENT_OTHER): Payer: BLUE CROSS/BLUE SHIELD

## 2015-04-30 ENCOUNTER — Other Ambulatory Visit: Payer: BLUE CROSS/BLUE SHIELD

## 2015-04-30 ENCOUNTER — Ambulatory Visit (HOSPITAL_BASED_OUTPATIENT_CLINIC_OR_DEPARTMENT_OTHER): Payer: BLUE CROSS/BLUE SHIELD | Admitting: Hematology and Oncology

## 2015-04-30 VITALS — BP 94/48 | HR 78 | Temp 98.2°F | Resp 20

## 2015-04-30 VITALS — BP 148/70 | HR 79 | Temp 98.4°F | Resp 25 | Ht 72.0 in | Wt 352.5 lb

## 2015-04-30 DIAGNOSIS — Z23 Encounter for immunization: Secondary | ICD-10-CM | POA: Diagnosis not present

## 2015-04-30 DIAGNOSIS — C8230 Follicular lymphoma grade IIIa, unspecified site: Secondary | ICD-10-CM

## 2015-04-30 DIAGNOSIS — L03115 Cellulitis of right lower limb: Secondary | ICD-10-CM

## 2015-04-30 DIAGNOSIS — J449 Chronic obstructive pulmonary disease, unspecified: Secondary | ICD-10-CM

## 2015-04-30 DIAGNOSIS — G4700 Insomnia, unspecified: Secondary | ICD-10-CM

## 2015-04-30 DIAGNOSIS — Z5112 Encounter for antineoplastic immunotherapy: Secondary | ICD-10-CM

## 2015-04-30 DIAGNOSIS — R600 Localized edema: Secondary | ICD-10-CM | POA: Diagnosis not present

## 2015-04-30 MED ORDER — SODIUM CHLORIDE 0.9 % IV SOLN
375.0000 mg/m2 | Freq: Once | INTRAVENOUS | Status: AC
  Administered 2015-04-30: 1100 mg via INTRAVENOUS
  Filled 2015-04-30: qty 110

## 2015-04-30 MED ORDER — SODIUM CHLORIDE 0.9 % IV SOLN
Freq: Once | INTRAVENOUS | Status: AC
  Administered 2015-04-30: 10:00:00 via INTRAVENOUS

## 2015-04-30 MED ORDER — ACETAMINOPHEN 325 MG PO TABS
ORAL_TABLET | ORAL | Status: AC
  Filled 2015-04-30: qty 2

## 2015-04-30 MED ORDER — ANTICOAGULANT SODIUM CITRATE 4% (200MG/5ML) IV SOLN
5.0000 mL | Freq: Once | Status: DC | PRN
  Filled 2015-04-30: qty 5

## 2015-04-30 MED ORDER — DIPHENHYDRAMINE HCL 25 MG PO CAPS
50.0000 mg | ORAL_CAPSULE | Freq: Once | ORAL | Status: AC
  Administered 2015-04-30: 50 mg via ORAL

## 2015-04-30 MED ORDER — SODIUM CHLORIDE 0.9 % IJ SOLN
10.0000 mL | INTRAMUSCULAR | Status: DC | PRN
  Administered 2015-04-30: 10 mL
  Filled 2015-04-30: qty 10

## 2015-04-30 MED ORDER — DIPHENHYDRAMINE HCL 25 MG PO CAPS
ORAL_CAPSULE | ORAL | Status: AC
  Filled 2015-04-30: qty 2

## 2015-04-30 MED ORDER — DOXYCYCLINE HYCLATE 100 MG PO TABS: 100.0000 mg | ORAL_TABLET | Freq: Every day | ORAL | Status: DC

## 2015-04-30 MED ORDER — ANTICOAGULANT SODIUM CITRATE 4% (200MG/5ML) IV SOLN
1.6000 mL | Freq: Once | Status: AC
  Administered 2015-04-30: 1.6 mL via INTRAVENOUS
  Filled 2015-04-30: qty 1.6

## 2015-04-30 MED ORDER — ACETAMINOPHEN 325 MG PO TABS
650.0000 mg | ORAL_TABLET | Freq: Once | ORAL | Status: AC
  Administered 2015-04-30: 650 mg via ORAL

## 2015-04-30 MED ORDER — INFLUENZA VAC SPLIT QUAD 0.5 ML IM SUSY
0.5000 mL | PREFILLED_SYRINGE | Freq: Once | INTRAMUSCULAR | Status: AC
  Administered 2015-04-30: 0.5 mL via INTRAMUSCULAR
  Filled 2015-04-30: qty 0.5

## 2015-04-30 NOTE — Assessment & Plan Note (Signed)
I reviewed the PET CT scan. He has near complete response to treatment. He has poor tolerance to chemotherapy with recurrent infection. I recommend discontinuation of chemotherapy and just go on maintenance rituximab only and he agreed with the plan. I will see him every 8 weeks with history, blood work, physical examination and rituximab.

## 2015-04-30 NOTE — Progress Notes (Signed)
[place] OFFICE PROGRESS NOTE  Patient Care Team: [name], MD as PCP - General (Internal Medicine) [name], MD as Consulting Physician (Cardiology) Heath Lark, MD as Consulting Physician (Hematology and Oncology) Jerrell Belfast, MD as Consulting Physician (Otolaryngology) Daryll Brod, MD as Consulting Physician (Orthopedic Surgery)  SUMMARY OF ONCOLOGIC HISTORY: Oncology History   FLIPI score of 2; stage 3 and areas of involvement >4     Follicular lymphoma grade 3a   06/24/2009 Imaging PET CT scan showed two small FDG positive left neck nodes   12/11/2014 Surgery He underwent excisional lymph node biopsy of the left supraclavicular lymph node/neck region   12/11/2014 Pathology Results Accession: [id number] biopsy show follicular lymphoma   0/86/7619 Imaging ECHO showed LVH but preserved EF   12/25/2014 Imaging PET scan showed disease above and below diaphragm   12/28/2014 Procedure He has port placement   12/31/2014 - 01/01/2015 Chemotherapy He received 1 cycle of bendamustine with rituximab, discontinued due to suspicion of allergic reaction to bendamustine   01/12/2015 - 01/19/2015 Hospital Admission He was admitted to the hospital with suspicious allergic reaction, significant bilateral lower extremity edema with cellulitis, pneumonia and mild fluid overload   03/18/2015 - 04/08/2015 Chemotherapy Treatment is switched to rituximab, Cytoxan, vincristine and prednisone   04/29/2015 Imaging  PET CT scan showed near complete response to treatment.   04/30/2015 -  Chemotherapy He received maintenance Rituxan    INTERVAL HISTORY: Please see below for problem oriented charting. He returns to review test results. Since he was last seen here, he developed skin infection and was prescribed antibiotics. Antibiotics helped heal the skin infection and his respiratory status. He has chronic shortness of breath and cough. He continues to have chronic nonhealing wound in  bilateral lower extremities. He denies fevers or chills. Denies side effects from recent treatment  REVIEW OF SYSTEMS:   Constitutional: Denies fevers, chills or abnormal weight loss Eyes: Denies blurriness of vision Ears, nose, mouth, throat, and face: Denies mucositis or sore throat Cardiovascular: Denies palpitation, chest discomfort  Gastrointestinal:  Denies nausea, heartburn or change in bowel habits Lymphatics: Denies new lymphadenopathy or easy bruising Neurological:Denies numbness, tingling or new weaknesses Behavioral/Psych: Mood is stable, no new changes  All other systems were reviewed with the patient and are negative.  I have reviewed the past medical history, past surgical history, social history and family history with the patient and they are unchanged from previous note.  ALLERGIES:  is allergic to bendamustine hcl; heparin; and tape.  MEDICATIONS:  Current Outpatient Prescriptions  Medication Sig Dispense Refill  . albuterol (PROVENTIL) (2.5 MG/3ML) 0.083% nebulizer solution Take 3 mLs (2.5 mg total) by nebulization every 2 (two) hours as needed for wheezing. 75 mL 2  . ANORO ELLIPTA 62.5-25 MCG/INH AEPB Inhale 1 puff into the lungs daily as needed. For wheezing & SOB    . aspirin EC 81 MG tablet Take 81 mg by mouth daily.    . clonazePAM (KLONOPIN) 0.5 MG tablet TAKE 1 TABLET BY MOUTH TWICE A DAY AS NEEDED FOR ANXIETY or at hs for insomnia 180 tablet 0  . clopidogrel (PLAVIX) 75 MG tablet TAKE 1 TABLET BY MOUTH DAILY WITH BREAKFAST. 90 tablet 3  . doxycycline (VIBRA-TABS) 100 MG tablet Take 1 tablet (100 mg total) by mouth daily. 42 tablet 0  . metoprolol succinate (TOPROL-XL) 50 MG 24 hr tablet Take 100 mg by mouth daily.    . nitroGLYCERIN (NITROSTAT) 0.4 MG SL tablet Place  1 tablet (0.4 mg total) under the tongue every 5 (five) minutes as needed for chest pain (Up to 3 doses). 25 tablet 4  . oxyCODONE-acetaminophen (PERCOCET/ROXICET) 5-325 MG per tablet Take 1  tablet by mouth every 6 (six) hours as needed for moderate pain. 120 tablet 0  . potassium chloride SA (KLOR-CON M20) 20 MEQ tablet TAKE 1 TABLET BY MOUTH DAILY 90 tablet 3  . simvastatin (ZOCOR) 20 MG tablet TAKE 1 TABLET (20 MG TOTAL) BY MOUTH AT BEDTIME. 90 tablet 3  . torsemide (DEMADEX) 100 MG tablet Take 100 mg by mouth daily.     No current facility-administered medications for this visit.   Facility-Administered Medications Ordered in Other Visits  Medication Dose Route Frequency Provider Last Rate Last Dose  . fludeoxyglucose F - 18 (FDG) injection 15.78 milli Curie  15.78 milli Curie Intravenous Once PRN Medication Radiologist, MD   15.78 milli Curie at 04/29/15 1409  . sodium chloride 0.9 % injection 10 mL  10 mL Intracatheter PRN Heath Lark, MD   10 mL at 04/30/15 1532    PHYSICAL EXAMINATION: ECOG PERFORMANCE STATUS: 2 - Symptomatic, <50% confined to bed  Filed Vitals:   04/30/15 0905  BP: 148/70  Pulse: 79  Temp: 98.4 F (36.9 C)  Resp: 25   Filed Weights   04/30/15 0905  Weight: 352 lb 8 oz (159.893 kg)    GENERAL:alert, no distress and comfortable. He is obese SKIN:  He has multiple skin lesions EYES: normal, Conjunctiva are pink and non-injected, sclera clear HEART: regular rate & rhythm and no murmurs  With moderate bilateral lower extremity edema ABDOMEN:abdomen soft, non-tender and normal bowel sounds Musculoskeletal:no cyanosis of digits and no clubbing  NEURO: alert & oriented x 3 with fluent speech, no focal motor/sensory deficits  LABORATORY DATA:  I have reviewed the data as listed    Component Value Date/Time   NA 146* 04/23/2015 1012   NA 139 01/27/2015 1524   K 4.2 04/23/2015 1012   K 3.7 01/27/2015 1524   CL 99 01/27/2015 1524   CO2 39* 04/23/2015 1012   CO2 36* 01/27/2015 1524   GLUCOSE 118 04/23/2015 1012   GLUCOSE 99 01/27/2015 1524   BUN 14.5 04/23/2015 1012   BUN 25* 01/27/2015 1524   CREATININE 1.1 04/23/2015 1012   CREATININE  1.05 01/27/2015 1524   CALCIUM 9.7 04/23/2015 1012   CALCIUM 9.2 01/27/2015 1524   PROT 6.8 04/23/2015 1012   PROT 6.6 01/15/2015 0500   ALBUMIN 3.6 04/23/2015 1012   ALBUMIN 3.1* 01/15/2015 0500   AST 15 04/23/2015 1012   AST 22 01/15/2015 0500   ALT 17 04/23/2015 1012   ALT 27 01/15/2015 0500   ALKPHOS 107 04/23/2015 1012   ALKPHOS 57 01/15/2015 0500   BILITOT 0.37 04/23/2015 1012   BILITOT 0.6 01/15/2015 0500   GFRNONAA >60 01/18/2015 0320   GFRAA >60 01/18/2015 0320    No results found for: SPEP, UPEP  Lab Results  Component Value Date   WBC 9.7 04/23/2015   NEUTROABS 7.1* 04/23/2015   HGB 14.8 04/23/2015   HCT 45.1 04/23/2015   MCV 99.5* 04/23/2015   PLT 220 04/23/2015      Chemistry      Component Value Date/Time   NA 146* 04/23/2015 1012   NA 139 01/27/2015 1524   K 4.2 04/23/2015 1012   K 3.7 01/27/2015 1524   CL 99 01/27/2015 1524   CO2 39* 04/23/2015 1012   CO2 36* 01/27/2015  1524   BUN 14.5 04/23/2015 1012   BUN 25* 01/27/2015 1524   CREATININE 1.1 04/23/2015 1012   CREATININE 1.05 01/27/2015 1524      Component Value Date/Time   CALCIUM 9.7 04/23/2015 1012   CALCIUM 9.2 01/27/2015 1524   ALKPHOS 107 04/23/2015 1012   ALKPHOS 57 01/15/2015 0500   AST 15 04/23/2015 1012   AST 22 01/15/2015 0500   ALT 17 04/23/2015 1012   ALT 27 01/15/2015 0500   BILITOT 0.37 04/23/2015 1012   BILITOT 0.6 01/15/2015 0500       RADIOGRAPHIC STUDIES: I have personally reviewed the radiological images as listed and agreed with the findings in the report. Nm Pet Image Restag (ps) Skull Base To Thigh  04/29/2015   CLINICAL DATA:  Subsequent treatment strategy for history of follicular lymphoma. Restaging.  EXAM: NUCLEAR MEDICINE PET SKULL BASE TO THIGH  TECHNIQUE: 15.8 mCi F-18 FDG was injected intravenously. Full-ring PET imaging was performed from the skull base to thigh after the radiotracer. CT data was obtained and used for attenuation correction and anatomic  localization.  FASTING BLOOD GLUCOSE:  Value: 105 mg/dl  COMPARISON:  12/25/2014  FINDINGS: NECK  Resolution of previously described left supraclavicular nodal mass and hypermetabolism. Minimal non hypermetabolic soft tissue thickening remains in this area, measuring on the order of 11 mm.  CHEST  No residual thoracic nodal hypermetabolism identified. Note is made of hypermetabolism corresponding to a right lower lobe pulmonary artery branch. This measures a S.U.V. max of 3.3, including on approximately image 100. No pulmonary nodule or nodal correlate identified.  ABDOMEN/PELVIS  Resolution of previously described retroperitoneal abdominal nodal hypermetabolism. A right inguinal node measures 8 mm and a S.U.V. max of 3.5. Nodes in this region on the prior measured up to 13 mm and a S.U.V. max of 4.2 (when remeasured).  Right external iliac node measures 11 mm a S.U.V. max of 3.8 on image 188. Compare 13 mm and a S.U.V. of 2.5 on the prior exam.  SKELETON  A focus of skin thickening about the low anterior right pelvis corresponds to hypermetabolism. This measures a S.U.V. max of 2.8, including on image 211. This is likely a new since the prior exam.Relatively diffuse hyper metabolism throughout the marrow. No focal lesion identified.  CT IMAGES PERFORMED FOR ATTENUATION CORRECTION  Right maxillary sinus fluid level. Subcutaneous lesion about the right posterior neck is likely a sebaceous cyst at 2.5 cm. Cardiomegaly. Prior median sternotomy. Pulmonary artery enlargement at 3.7 cm. Bilateral pleural plaques, with calcification. hepatomegaly. Cholelithiasis. Bilateral nephrolithiasis. Decreased left periaortic abdominal nodes size at 1.0 cm today versus 1.6 cm on the prior. Degenerative partial fusion of the bilateral sacroiliac joints.  IMPRESSION: 1. Response to therapy. Decrease size and hypermetabolism of nodes. Residual disease suspected, including within the right hemipelvis. 2. Marrow hypermetabolism is diffuse  and favored to be due to stimulation by chemotherapy. 3. Skin thickening and hypermetabolism about the low right pelvis is indeterminate and warrants physical exam correlation. 4. Bilateral pleural plaques with calcification, consistent with asbestos related pleural disease. 5. Cardiomegaly with prior median sternotomy. Pulmonary artery enlargement suggests pulmonary arterial hypertension. 6. Cholelithiasis and nephrolithiasis. 7. Mild hypermetabolism along the course of a right pulmonary artery branch to the lower lobe. No pulmonary parenchymal or nodal correlate in this area. This is indeterminate and could be physiologic or represent small volume embolism after radiopharmaceutical injection. 8. Sinus disease   Electronically Signed   By: Adria Devon.D.  On: 04/29/2015 17:24     ASSESSMENT & PLAN:  Follicular lymphoma grade 3a  I reviewed the PET CT scan. He has near complete response to treatment. He has poor tolerance to chemotherapy with recurrent infection. I recommend discontinuation of chemotherapy and just go on maintenance rituximab only and he agreed with the plan. I will see him every 8 weeks with history, blood work, physical examination and rituximab.  Bilateral lower extremity edema This is related to morbid obesity and chronic congestive heart failure. He will continue diuretic therapy as directed.     Cellulitis of right lower extremity  He has recurrent skin infection, respiratory tract infection and cellulitis. He was prescribed doxycycline recently with improvement of infection. We discussed the strategy of chronic suppressive therapy with doxycycline in a prophylactic fashion and he agreed to proceed. I gave him prescription doxycycline 100 mg daily for 6 weeks and we will reassess in the near future  COPD mixed type  The patient have chronic hypoxemia and wears oxygen intermittently.   He follows closely with pulmonologist.  his respiratory status improved with  recent doxycycline. We will continue the same.  Insomnia  He complained of chronic insomnia. He does not use CPAP at night. The patient have chronic hypoxemia due to COPD and also chronic congestive heart failure. I do not recommend anxiolytic for  fear that it could cause respiratory suppression further. He understands.   Orders Placed This Encounter  Procedures  . CBC with Differential/Platelet    Standing Status: Future     Number of Occurrences:      Standing Expiration Date: 06/03/2016  . Comprehensive metabolic panel    Standing Status: Future     Number of Occurrences:      Standing Expiration Date: 06/03/2016  . Lactate dehydrogenase    Standing Status: Future     Number of Occurrences:      Standing Expiration Date: 06/03/2016   All questions were answered. The patient knows to call the clinic with any problems, questions or concerns. No barriers to learning was detected. I spent 30 minutes counseling the patient face to face. The total time spent in the appointment was 40 minutes and more than 50% was on counseling and review of test results     [facility], [place], MD 04/30/2015 3:38 PM

## 2015-04-30 NOTE — Assessment & Plan Note (Signed)
He complained of chronic insomnia. He does not use CPAP at night. The patient have chronic hypoxemia due to COPD and also chronic congestive heart failure. I do not recommend anxiolytic for  fear that it could cause respiratory suppression further. He understands.

## 2015-04-30 NOTE — Assessment & Plan Note (Signed)
This is related to morbid obesity and chronic congestive heart failure. He will continue diuretic therapy as directed.  

## 2015-04-30 NOTE — Assessment & Plan Note (Signed)
He has recurrent skin infection, respiratory tract infection and cellulitis. He was prescribed doxycycline recently with improvement of infection. We discussed the strategy of chronic suppressive therapy with doxycycline in a prophylactic fashion and he agreed to proceed. I gave him prescription doxycycline 100 mg daily for 6 weeks and we will reassess in the near future

## 2015-04-30 NOTE — Assessment & Plan Note (Signed)
The patient have chronic hypoxemia and wears oxygen intermittently.   He follows closely with pulmonologist.  his respiratory status improved with recent doxycycline. We will continue the same.

## 2015-04-30 NOTE — Patient Instructions (Signed)
[facility] Discharge Instructions for Patients Receiving Chemotherapy  Today you received the following chemotherapy agents: Rituxan   To help prevent nausea and vomiting after your treatment, we encourage you to take your nausea medication as directed.    If you develop nausea and vomiting that is not controlled by your nausea medication, call the clinic.   BELOW ARE SYMPTOMS THAT SHOULD BE REPORTED IMMEDIATELY:  *FEVER GREATER THAN 100.5 F  *CHILLS WITH OR WITHOUT FEVER  NAUSEA AND VOMITING THAT IS NOT CONTROLLED WITH YOUR NAUSEA MEDICATION  *UNUSUAL SHORTNESS OF BREATH  *UNUSUAL BRUISING OR BLEEDING  TENDERNESS IN MOUTH AND THROAT WITH OR WITHOUT PRESENCE OF ULCERS  *URINARY PROBLEMS  *BOWEL PROBLEMS  UNUSUAL RASH Items with * indicate a potential emergency and should be followed up as soon as possible.  Feel free to call the clinic you have any questions or concerns. The clinic phone number is [phone].  Please show the CHEMO ALERT CARD at check-in to the Emergency Department and triage nurse.   

## 2015-05-03 ENCOUNTER — Telehealth: Payer: Self-pay | Admitting: Hematology and Oncology

## 2015-05-03 DIAGNOSIS — L97211 Non-pressure chronic ulcer of right calf limited to breakdown of skin: Secondary | ICD-10-CM | POA: Diagnosis not present

## 2015-05-03 DIAGNOSIS — L97321 Non-pressure chronic ulcer of left ankle limited to breakdown of skin: Secondary | ICD-10-CM | POA: Diagnosis not present

## 2015-05-03 DIAGNOSIS — E11622 Type 2 diabetes mellitus with other skin ulcer: Secondary | ICD-10-CM | POA: Diagnosis not present

## 2015-05-03 DIAGNOSIS — L98491 Non-pressure chronic ulcer of skin of other sites limited to breakdown of skin: Secondary | ICD-10-CM | POA: Diagnosis not present

## 2015-05-03 NOTE — Telephone Encounter (Signed)
Pt confirmed labs/ov per 09/16 POF, gave pt AVS and Calendar.... KJ, chemo was added to schedule by chemo scheduler.Marland KitchenMarland Kitchen

## 2015-05-10 DIAGNOSIS — L97211 Non-pressure chronic ulcer of right calf limited to breakdown of skin: Secondary | ICD-10-CM | POA: Diagnosis not present

## 2015-05-10 DIAGNOSIS — L98491 Non-pressure chronic ulcer of skin of other sites limited to breakdown of skin: Secondary | ICD-10-CM | POA: Diagnosis not present

## 2015-05-10 DIAGNOSIS — L97321 Non-pressure chronic ulcer of left ankle limited to breakdown of skin: Secondary | ICD-10-CM | POA: Diagnosis not present

## 2015-05-10 DIAGNOSIS — E11622 Type 2 diabetes mellitus with other skin ulcer: Secondary | ICD-10-CM | POA: Diagnosis not present

## 2015-05-17 ENCOUNTER — Encounter (HOSPITAL_BASED_OUTPATIENT_CLINIC_OR_DEPARTMENT_OTHER): Payer: BLUE CROSS/BLUE SHIELD | Attending: Plastic Surgery

## 2015-05-17 DIAGNOSIS — L97812 Non-pressure chronic ulcer of other part of right lower leg with fat layer exposed: Secondary | ICD-10-CM | POA: Insufficient documentation

## 2015-05-17 DIAGNOSIS — E785 Hyperlipidemia, unspecified: Secondary | ICD-10-CM | POA: Diagnosis not present

## 2015-05-17 DIAGNOSIS — E1159 Type 2 diabetes mellitus with other circulatory complications: Secondary | ICD-10-CM | POA: Diagnosis not present

## 2015-05-17 DIAGNOSIS — J449 Chronic obstructive pulmonary disease, unspecified: Secondary | ICD-10-CM | POA: Insufficient documentation

## 2015-05-17 DIAGNOSIS — I2510 Atherosclerotic heart disease of native coronary artery without angina pectoris: Secondary | ICD-10-CM | POA: Insufficient documentation

## 2015-05-17 DIAGNOSIS — I509 Heart failure, unspecified: Secondary | ICD-10-CM | POA: Diagnosis not present

## 2015-05-17 DIAGNOSIS — C8291 Follicular lymphoma, unspecified, lymph nodes of head, face, and neck: Secondary | ICD-10-CM | POA: Insufficient documentation

## 2015-05-17 DIAGNOSIS — E1136 Type 2 diabetes mellitus with diabetic cataract: Secondary | ICD-10-CM | POA: Insufficient documentation

## 2015-05-17 DIAGNOSIS — L97821 Non-pressure chronic ulcer of other part of left lower leg limited to breakdown of skin: Secondary | ICD-10-CM | POA: Insufficient documentation

## 2015-05-17 DIAGNOSIS — I252 Old myocardial infarction: Secondary | ICD-10-CM | POA: Diagnosis not present

## 2015-05-17 DIAGNOSIS — E1140 Type 2 diabetes mellitus with diabetic neuropathy, unspecified: Secondary | ICD-10-CM | POA: Insufficient documentation

## 2015-05-17 DIAGNOSIS — I110 Hypertensive heart disease with heart failure: Secondary | ICD-10-CM | POA: Diagnosis not present

## 2015-05-19 ENCOUNTER — Telehealth: Payer: Self-pay | Admitting: Internal Medicine

## 2015-05-19 NOTE — Telephone Encounter (Signed)
Pt request refill for clonazePAM (KLONOPIN) 0.5 MG tablet and tramadol 50 mg. Please advise.

## 2015-05-20 ENCOUNTER — Other Ambulatory Visit: Payer: Self-pay

## 2015-05-20 MED ORDER — CLONAZEPAM 0.5 MG PO TABS: ORAL_TABLET | ORAL | Status: DC

## 2015-05-20 NOTE — Telephone Encounter (Signed)
i sent in rx for clonazepam to pharm with 5 refills----dr plotnikov, are you also wanting to approve rx for tramadol 50mg -----tramadol is not on patients med list----please advise, thanks

## 2015-05-20 NOTE — Telephone Encounter (Signed)
OK to fill this prescription with additional refills x5 Thank you!  

## 2015-05-20 NOTE — Telephone Encounter (Signed)
Please advise, thanks.

## 2015-05-21 NOTE — Telephone Encounter (Signed)
No Tramadol - he is on Percocet Thx

## 2015-05-24 DIAGNOSIS — E785 Hyperlipidemia, unspecified: Secondary | ICD-10-CM | POA: Diagnosis not present

## 2015-05-24 DIAGNOSIS — L97821 Non-pressure chronic ulcer of other part of left lower leg limited to breakdown of skin: Secondary | ICD-10-CM | POA: Diagnosis not present

## 2015-05-24 DIAGNOSIS — C8291 Follicular lymphoma, unspecified, lymph nodes of head, face, and neck: Secondary | ICD-10-CM | POA: Diagnosis not present

## 2015-05-24 DIAGNOSIS — L97812 Non-pressure chronic ulcer of other part of right lower leg with fat layer exposed: Secondary | ICD-10-CM | POA: Diagnosis not present

## 2015-05-25 NOTE — Telephone Encounter (Signed)
Pt is aware,,,/lmb

## 2015-05-31 ENCOUNTER — Telehealth: Payer: Self-pay | Admitting: *Deleted

## 2015-05-31 MED ORDER — TORSEMIDE 100 MG PO TABS: 100.0000 mg | ORAL_TABLET | Freq: Every day | ORAL | Status: DC

## 2015-05-31 NOTE — Telephone Encounter (Signed)
Received call pt is needing refill on his Torsemide. Verified pharmacy inform sending to [facility].../lmb

## 2015-06-07 ENCOUNTER — Telehealth: Payer: Self-pay | Admitting: *Deleted

## 2015-06-07 DIAGNOSIS — L97812 Non-pressure chronic ulcer of other part of right lower leg with fat layer exposed: Secondary | ICD-10-CM | POA: Diagnosis not present

## 2015-06-07 DIAGNOSIS — E785 Hyperlipidemia, unspecified: Secondary | ICD-10-CM | POA: Diagnosis not present

## 2015-06-07 DIAGNOSIS — L97821 Non-pressure chronic ulcer of other part of left lower leg limited to breakdown of skin: Secondary | ICD-10-CM | POA: Diagnosis not present

## 2015-06-07 DIAGNOSIS — C8291 Follicular lymphoma, unspecified, lymph nodes of head, face, and neck: Secondary | ICD-10-CM | POA: Diagnosis not present

## 2015-06-07 MED ORDER — OXYCODONE-ACETAMINOPHEN 5-325 MG PO TABS: 1.0000 | ORAL_TABLET | Freq: Four times a day (QID) | ORAL | Status: DC | PRN

## 2015-06-07 NOTE — Telephone Encounter (Signed)
Receive call pt requesting refill on his pain med oxycodone...[name]

## 2015-06-07 NOTE — Telephone Encounter (Signed)
Notified pt rx ready for pick-up.../lmb 

## 2015-06-07 NOTE — Telephone Encounter (Signed)
OK to fill this prescription with additional refills x0 Thank you!  

## 2015-06-21 ENCOUNTER — Encounter (HOSPITAL_BASED_OUTPATIENT_CLINIC_OR_DEPARTMENT_OTHER): Payer: BLUE CROSS/BLUE SHIELD | Attending: Plastic Surgery

## 2015-06-21 DIAGNOSIS — I509 Heart failure, unspecified: Secondary | ICD-10-CM | POA: Insufficient documentation

## 2015-06-21 DIAGNOSIS — E785 Hyperlipidemia, unspecified: Secondary | ICD-10-CM | POA: Insufficient documentation

## 2015-06-21 DIAGNOSIS — L97811 Non-pressure chronic ulcer of other part of right lower leg limited to breakdown of skin: Secondary | ICD-10-CM | POA: Insufficient documentation

## 2015-06-21 DIAGNOSIS — I110 Hypertensive heart disease with heart failure: Secondary | ICD-10-CM | POA: Insufficient documentation

## 2015-06-21 DIAGNOSIS — J449 Chronic obstructive pulmonary disease, unspecified: Secondary | ICD-10-CM | POA: Insufficient documentation

## 2015-06-21 DIAGNOSIS — I252 Old myocardial infarction: Secondary | ICD-10-CM | POA: Insufficient documentation

## 2015-06-21 DIAGNOSIS — C8291 Follicular lymphoma, unspecified, lymph nodes of head, face, and neck: Secondary | ICD-10-CM | POA: Insufficient documentation

## 2015-06-21 DIAGNOSIS — E1140 Type 2 diabetes mellitus with diabetic neuropathy, unspecified: Secondary | ICD-10-CM | POA: Insufficient documentation

## 2015-06-21 DIAGNOSIS — Z9221 Personal history of antineoplastic chemotherapy: Secondary | ICD-10-CM | POA: Insufficient documentation

## 2015-06-21 DIAGNOSIS — I87311 Chronic venous hypertension (idiopathic) with ulcer of right lower extremity: Secondary | ICD-10-CM | POA: Insufficient documentation

## 2015-06-21 DIAGNOSIS — I2510 Atherosclerotic heart disease of native coronary artery without angina pectoris: Secondary | ICD-10-CM | POA: Insufficient documentation

## 2015-06-21 DIAGNOSIS — E11622 Type 2 diabetes mellitus with other skin ulcer: Secondary | ICD-10-CM | POA: Insufficient documentation

## 2015-06-25 ENCOUNTER — Encounter: Payer: Self-pay | Admitting: Hematology and Oncology

## 2015-06-25 ENCOUNTER — Ambulatory Visit: Payer: BLUE CROSS/BLUE SHIELD

## 2015-06-25 ENCOUNTER — Ambulatory Visit (HOSPITAL_BASED_OUTPATIENT_CLINIC_OR_DEPARTMENT_OTHER): Payer: BLUE CROSS/BLUE SHIELD | Admitting: Hematology and Oncology

## 2015-06-25 ENCOUNTER — Other Ambulatory Visit (HOSPITAL_BASED_OUTPATIENT_CLINIC_OR_DEPARTMENT_OTHER): Payer: BLUE CROSS/BLUE SHIELD

## 2015-06-25 ENCOUNTER — Telehealth: Payer: Self-pay | Admitting: Hematology and Oncology

## 2015-06-25 ENCOUNTER — Ambulatory Visit (HOSPITAL_BASED_OUTPATIENT_CLINIC_OR_DEPARTMENT_OTHER): Payer: BLUE CROSS/BLUE SHIELD

## 2015-06-25 VITALS — BP 153/64 | HR 71 | Temp 98.5°F | Resp 22 | Ht 72.0 in | Wt 353.2 lb

## 2015-06-25 VITALS — BP 147/57 | HR 64 | Temp 98.5°F | Resp 20

## 2015-06-25 VITALS — BP 153/64 | HR 71 | Temp 98.5°F | Resp 22

## 2015-06-25 DIAGNOSIS — R609 Edema, unspecified: Secondary | ICD-10-CM

## 2015-06-25 DIAGNOSIS — D696 Thrombocytopenia, unspecified: Secondary | ICD-10-CM

## 2015-06-25 DIAGNOSIS — C8230 Follicular lymphoma grade IIIa, unspecified site: Secondary | ICD-10-CM

## 2015-06-25 DIAGNOSIS — C8239 Follicular lymphoma grade IIIa, extranodal and solid organ sites: Secondary | ICD-10-CM

## 2015-06-25 DIAGNOSIS — Z720 Tobacco use: Secondary | ICD-10-CM

## 2015-06-25 DIAGNOSIS — Z5112 Encounter for antineoplastic immunotherapy: Secondary | ICD-10-CM

## 2015-06-25 DIAGNOSIS — Z95828 Presence of other vascular implants and grafts: Secondary | ICD-10-CM

## 2015-06-25 DIAGNOSIS — Z452 Encounter for adjustment and management of vascular access device: Secondary | ICD-10-CM

## 2015-06-25 DIAGNOSIS — R600 Localized edema: Secondary | ICD-10-CM

## 2015-06-25 DIAGNOSIS — L97919 Non-pressure chronic ulcer of unspecified part of right lower leg with unspecified severity: Secondary | ICD-10-CM | POA: Insufficient documentation

## 2015-06-25 DIAGNOSIS — L97929 Non-pressure chronic ulcer of unspecified part of left lower leg with unspecified severity: Secondary | ICD-10-CM

## 2015-06-25 DIAGNOSIS — C8238 Follicular lymphoma grade IIIa, lymph nodes of multiple sites: Secondary | ICD-10-CM

## 2015-06-25 LAB — CBC WITH DIFFERENTIAL/PLATELET
BASO%: 0.7 % (ref 0.0–2.0)
Basophils Absolute: 0.1 10*3/uL (ref 0.0–0.1)
EOS ABS: 0.3 10*3/uL (ref 0.0–0.5)
EOS%: 3.3 % (ref 0.0–7.0)
HCT: 49.8 % (ref 38.4–49.9)
HEMOGLOBIN: 16 g/dL (ref 13.0–17.1)
LYMPH#: 0.8 10*3/uL — AB (ref 0.9–3.3)
LYMPH%: 9.6 % — ABNORMAL LOW (ref 14.0–49.0)
MCH: 32 pg (ref 27.2–33.4)
MCHC: 32.1 g/dL (ref 32.0–36.0)
MCV: 99.6 fL — ABNORMAL HIGH (ref 79.3–98.0)
MONO#: 1 10*3/uL — AB (ref 0.1–0.9)
MONO%: 11.5 % (ref 0.0–14.0)
NEUT%: 74.9 % (ref 39.0–75.0)
NEUTROS ABS: 6.6 10*3/uL — AB (ref 1.5–6.5)
NRBC: 0 % (ref 0–0)
PLATELETS: 114 10*3/uL — AB (ref 140–400)
RBC: 5 10*6/uL (ref 4.20–5.82)
RDW: 14.8 % — AB (ref 11.0–14.6)
WBC: 8.8 10*3/uL (ref 4.0–10.3)

## 2015-06-25 LAB — COMPREHENSIVE METABOLIC PANEL (CC13)
ALT: 16 U/L (ref 0–55)
ANION GAP: 9 meq/L (ref 3–11)
AST: 15 U/L (ref 5–34)
Albumin: 3.7 g/dL (ref 3.5–5.0)
Alkaline Phosphatase: 87 U/L (ref 40–150)
BILIRUBIN TOTAL: 0.37 mg/dL (ref 0.20–1.20)
BUN: 9.6 mg/dL (ref 7.0–26.0)
CO2: 34 meq/L — AB (ref 22–29)
CREATININE: 0.7 mg/dL (ref 0.7–1.3)
Calcium: 9.2 mg/dL (ref 8.4–10.4)
Chloride: 98 mEq/L (ref 98–109)
EGFR: >90 mL/min/{1.73_m2} (ref >90)
GLUCOSE: 134 mg/dL (ref 70–140)
Potassium: 4 mEq/L (ref 3.5–5.1)
Sodium: 141 mEq/L (ref 136–145)
TOTAL PROTEIN: 6.5 g/dL (ref 6.4–8.3)

## 2015-06-25 LAB — LACTATE DEHYDROGENASE (CC13): LDH: 195 U/L (ref 125–245)

## 2015-06-25 MED ORDER — DIPHENHYDRAMINE HCL 25 MG PO CAPS
ORAL_CAPSULE | ORAL | Status: AC
  Filled 2015-06-25: qty 2

## 2015-06-25 MED ORDER — ACETAMINOPHEN 325 MG PO TABS
650.0000 mg | ORAL_TABLET | Freq: Once | ORAL | Status: AC
  Administered 2015-06-25: 650 mg via ORAL

## 2015-06-25 MED ORDER — ALTEPLASE 2 MG IJ SOLR
2.0000 mg | Freq: Once | INTRAMUSCULAR | Status: AC | PRN
  Administered 2015-06-25: 2 mg
  Filled 2015-06-25: qty 2

## 2015-06-25 MED ORDER — ACETAMINOPHEN 325 MG PO TABS
ORAL_TABLET | ORAL | Status: AC
  Filled 2015-06-25: qty 2

## 2015-06-25 MED ORDER — ANTICOAGULANT SODIUM CITRATE 4% (200MG/5ML) IV SOLN
5.0000 mL | Freq: Once | Status: AC | PRN
  Administered 2015-06-25: 5 mL via INTRAVENOUS
  Filled 2015-06-25 (×2): qty 5

## 2015-06-25 MED ORDER — SODIUM CHLORIDE 0.9 % IJ SOLN
10.0000 mL | INTRAMUSCULAR | Status: DC | PRN
  Administered 2015-06-25: 10 mL via INTRAVENOUS
  Filled 2015-06-25: qty 10

## 2015-06-25 MED ORDER — DIPHENHYDRAMINE HCL 25 MG PO CAPS
ORAL_CAPSULE | ORAL | Status: AC
  Filled 2015-06-25: qty 1

## 2015-06-25 MED ORDER — SODIUM CHLORIDE 0.9 % IV SOLN
375.0000 mg/m2 | Freq: Once | INTRAVENOUS | Status: AC
  Administered 2015-06-25: 1100 mg via INTRAVENOUS
  Filled 2015-06-25: qty 110

## 2015-06-25 MED ORDER — DIPHENHYDRAMINE HCL 25 MG PO CAPS
50.0000 mg | ORAL_CAPSULE | Freq: Once | ORAL | Status: AC
  Administered 2015-06-25: 50 mg via ORAL

## 2015-06-25 MED ORDER — SODIUM CHLORIDE 0.9 % IV SOLN
Freq: Once | INTRAVENOUS | Status: AC
  Administered 2015-06-25: 12:00:00 via INTRAVENOUS

## 2015-06-25 MED ORDER — SODIUM CHLORIDE 0.9 % IJ SOLN
10.0000 mL | INTRAMUSCULAR | Status: DC | PRN
  Administered 2015-06-25: 10 mL
  Filled 2015-06-25: qty 10

## 2015-06-25 NOTE — Assessment & Plan Note (Signed)
This is related to morbid obesity and chronic congestive heart failure. He will continue diuretic therapy as directed.  

## 2015-06-25 NOTE — Telephone Encounter (Signed)
GAVE PATIENT AVS REPORT AND APPOINTMENTS FOR January 2017. °

## 2015-06-25 NOTE — Telephone Encounter (Signed)
Left message for patient re appointments for December and January and mailed schedule.

## 2015-06-25 NOTE — Patient Instructions (Signed)
[facility] Discharge Instructions for Patients Receiving Chemotherapy  Today you received the following chemotherapy agents: Rituxan   To help prevent nausea and vomiting after your treatment, we encourage you to take your nausea medication as directed.    If you develop nausea and vomiting that is not controlled by your nausea medication, call the clinic.   BELOW ARE SYMPTOMS THAT SHOULD BE REPORTED IMMEDIATELY:  *FEVER GREATER THAN 100.5 F  *CHILLS WITH OR WITHOUT FEVER  NAUSEA AND VOMITING THAT IS NOT CONTROLLED WITH YOUR NAUSEA MEDICATION  *UNUSUAL SHORTNESS OF BREATH  *UNUSUAL BRUISING OR BLEEDING  TENDERNESS IN MOUTH AND THROAT WITH OR WITHOUT PRESENCE OF ULCERS  *URINARY PROBLEMS  *BOWEL PROBLEMS  UNUSUAL RASH Items with * indicate a potential emergency and should be followed up as soon as possible.  Feel free to call the clinic you have any questions or concerns. The clinic phone number is [phone].  Please show the CHEMO ALERT CARD at check-in to the Emergency Department and triage nurse.   

## 2015-06-25 NOTE — Progress Notes (Signed)
1217 blood return obtained from port-a-cath, 10cc blood wasted, labs obtained and tretament started, per [facility] RN (Dr. Calton Dach nurse) okay to treat without lab results.  Pt recommended it have port-a-cath flushed between treatments due to decreased blood return at this appointment. POF sent to schedulers for a flush appointment in early December to be scheduled. Pt verbalizes understanding and agrees with plan.

## 2015-06-25 NOTE — Progress Notes (Signed)
Ok to proceed with Rituxan today w/o lab results first per Dr. Alvy Bimler.  Labs to be obtained today before pt leaves but do not need for treatment today.

## 2015-06-25 NOTE — Assessment & Plan Note (Signed)
I spent some time counseling the patient the importance of tobacco cessation. He is currently not interested to quit now.  

## 2015-06-25 NOTE — Progress Notes (Signed)
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[place])   06/24/2009 Imaging PET CT scan showed two small FDG positive left neck nodes   12/11/2014 Surgery He underwent excisional lymph node biopsy of the left supraclavicular lymph node/neck region   12/11/2014 Pathology Results Accession: [id number] biopsy show follicular lymphoma   [id number] Imaging ECHO showed LVH but preserved EF   12/25/2014 Imaging PET scan showed disease above and below diaphragm   12/28/2014 Procedure He has port placement   12/31/2014 - 01/01/2015 Chemotherapy He received 1 cycle of bendamustine with rituximab, discontinued due to suspicion of allergic reaction to bendamustine   01/12/2015 - 01/19/2015 Hospital Admission He was admitted to the hospital with suspicious allergic reaction, significant bilateral lower extremity edema with cellulitis, pneumonia and mild fluid overload   03/18/2015 - 04/08/2015 Chemotherapy Treatment is switched to rituximab, Cytoxan, vincristine and prednisone   04/29/2015 Imaging  PET CT scan showed near complete response to treatment.   04/30/2015 -  Chemotherapy He received maintenance Rituxan    INTERVAL HISTORY: Please see below for problem oriented charting. He is seen prior to his treatment with rituximab. He continues to have chronic nonhealing wound and is on aggressive wound care. He continues to smoke sporadically. Denies recent infection. No new lymphadenopathy. He continues to have chronic bilateral lower extremity  edema.  REVIEW OF SYSTEMS:   Constitutional: Denies fevers, chills or abnormal weight loss Eyes: Denies blurriness of vision Ears, nose, mouth, throat, and face: Denies mucositis or sore throat Respiratory: Denies cough, dyspnea or wheezes Cardiovascular: Denies palpitation, chest discomfort Gastrointestinal:  Denies nausea, heartburn or change in bowel habits Lymphatics: Denies new lymphadenopathy or easy bruising Neurological:Denies numbness, tingling or new weaknesses Behavioral/Psych: Mood is stable, no new changes  All other systems were reviewed with the patient and are negative.  I have reviewed the past medical history, past surgical history, social history and family history with the patient and they are unchanged from previous note.  ALLERGIES:  is allergic to bendamustine hcl; heparin; and tape.  MEDICATIONS:  Current Outpatient Prescriptions  Medication Sig Dispense Refill  . albuterol (PROVENTIL) (2.5 MG/3ML) 0.083% nebulizer solution Take 3 mLs (2.5 mg total) by nebulization every 2 (two) hours as needed for wheezing. 75 mL 2  . ANORO ELLIPTA 62.5-25 MCG/INH AEPB Inhale 1 puff into the lungs daily as needed. For wheezing & SOB    . aspirin EC 81 MG tablet Take 81 mg by mouth daily.    . clonazePAM (KLONOPIN) 0.5 MG tablet TAKE 1 TABLET BY MOUTH TWICE A DAY AS NEEDED FOR ANXIETY or at hs for insomnia 180 tablet 5  . clopidogrel (PLAVIX) 75 MG tablet TAKE 1 TABLET BY MOUTH DAILY WITH BREAKFAST. 90 tablet 3  . metoprolol succinate (TOPROL-XL) 50 MG 24 hr tablet Take 100 mg by mouth daily.    . nitroGLYCERIN (NITROSTAT) 0.4 MG SL tablet Place 1 tablet (0.4 mg total) under the tongue every 5 (five) minutes as needed for chest pain (Up to 3 doses). 25 tablet 4  . oxyCODONE-acetaminophen (PERCOCET/ROXICET) 5-325 MG tablet  Take 1 tablet by mouth every 6 (six) hours as needed for moderate pain. 120 tablet 0  . potassium chloride SA (KLOR-CON M20) 20 MEQ tablet TAKE 1 TABLET BY MOUTH  DAILY 90 tablet 3  . simvastatin (ZOCOR) 20 MG tablet TAKE 1 TABLET (20 MG TOTAL) BY MOUTH AT BEDTIME. 90 tablet 3  . torsemide (DEMADEX) 100 MG tablet Take 1 tablet (100 mg total) by mouth daily. 30 tablet 3   No current facility-administered medications for this visit.   Facility-Administered Medications Ordered in Other Visits  Medication Dose Route Frequency Provider Last Rate Last Dose  . anticoagulant sodium citrate solution 5 mL  5 mL Intravenous Once PRN Heath Lark, MD      . sodium chloride 0.9 % injection 10 mL  10 mL Intracatheter PRN Heath Lark, MD        PHYSICAL EXAMINATION: ECOG PERFORMANCE STATUS: 1 - Symptomatic but completely ambulatory  Filed Vitals:   06/25/15 1020  BP: 153/64  Pulse: 71  Temp: 98.5 F (36.9 C)  Resp: 22   Filed Weights   06/25/15 1020  Weight: 353 lb 3.2 oz (160.21 kg)    GENERAL:alert, no distress and comfortable. He is morbidly obese SKIN: he has thin skin and multiple scabs from scratching. His wife show me multiple pictures regarding his chronic nonhealing wound. Both lower extremities are bandaged; I did not remove the bandages EYES: normal, Conjunctiva are pink and non-injected, sclera clear OROPHARYNX:no exudate, no erythema and lips, buccal mucosa, and tongue normal  NECK: supple, thyroid normal size, non-tender, without nodularity LYMPH:  no palpable lymphadenopathy in the cervical, axillary or inguinal LUNGS: clear to auscultation and percussion with normal breathing effort HEART: regular rate & rhythm and no murmurs  ABDOMEN:abdomen soft, non-tender and normal bowel sounds Musculoskeletal:no cyanosis of digits and no clubbing  NEURO: alert & oriented x 3 with fluent speech, no focal motor/sensory deficits  LABORATORY DATA:  I have reviewed the data as listed    Component Value Date/Time   NA 141 06/25/2015 0941   NA 139 01/27/2015 1524   K 4.0 06/25/2015 0941   K 3.7 01/27/2015 1524   CL 99 01/27/2015 1524   CO2 34*  06/25/2015 0941   CO2 36* 01/27/2015 1524   GLUCOSE 134 06/25/2015 0941   GLUCOSE 99 01/27/2015 1524   BUN 9.6 06/25/2015 0941   BUN 25* 01/27/2015 1524   CREATININE 0.7 06/25/2015 0941   CREATININE 1.05 01/27/2015 1524   CALCIUM 9.2 06/25/2015 0941   CALCIUM 9.2 01/27/2015 1524   PROT 6.5 06/25/2015 0941   PROT 6.6 01/15/2015 0500   ALBUMIN 3.7 06/25/2015 0941   ALBUMIN 3.1* 01/15/2015 0500   AST 15 06/25/2015 0941   AST 22 01/15/2015 0500   ALT 16 06/25/2015 0941   ALT 27 01/15/2015 0500   ALKPHOS 87 06/25/2015 0941   ALKPHOS 57 01/15/2015 0500   BILITOT 0.37 06/25/2015 0941   BILITOT 0.6 01/15/2015 0500   GFRNONAA >60 01/18/2015 0320   GFRAA >60 01/18/2015 0320    No results found for: SPEP, UPEP  Lab Results  Component Value Date   WBC 8.8 06/25/2015   NEUTROABS 6.6* 06/25/2015   HGB 16.0 06/25/2015   HCT 49.8 06/25/2015   MCV 99.6* 06/25/2015   PLT 114* 06/25/2015      Chemistry      Component Value Date/Time   NA 141 06/25/2015 0941   NA 139 01/27/2015 1524   K 4.0 06/25/2015 0941  K 3.7 01/27/2015 1524   CL 99 01/27/2015 1524   CO2 34* 06/25/2015 0941   CO2 36* 01/27/2015 1524   BUN 9.6 06/25/2015 0941   BUN 25* 01/27/2015 1524   CREATININE 0.7 06/25/2015 0941   CREATININE 1.05 01/27/2015 1524      Component Value Date/Time   CALCIUM 9.2 06/25/2015 0941   CALCIUM 9.2 01/27/2015 1524   ALKPHOS 87 06/25/2015 0941   ALKPHOS 57 01/15/2015 0500   AST 15 06/25/2015 0941   AST 22 01/15/2015 0500   ALT 16 06/25/2015 0941   ALT 27 01/15/2015 0500   BILITOT 0.37 06/25/2015 0941   BILITOT 0.6 01/15/2015 0500     ASSESSMENT & PLAN:  Follicular lymphoma grade 3a His last PET scan showed near complete response to treatment. He has poor tolerance to chemotherapy with recurrent infection. I recommend discontinuation of chemotherapy and just go on maintenance rituximab only and he agreed with the plan. I will see him every 8 weeks with history, blood  work, physical examination and rituximab. I plan to repeat imaging study in March 2017.    Thrombocytopenia ([place]) This is likely due to recent treatment. The patient denies recent history of bleeding such as epistaxis, hematuria or hematochezia. He is asymptomatic from the low platelet count. I will observe for now.  he does not require transfusion now. I will continue the chemotherapy at current dose without dosage adjustment.  If the thrombocytopenia gets progressive worse in the future, I might have to delay his treatment or adjust the chemotherapy dose.    Tobacco abuse I spent some time counseling the patient the importance of tobacco cessation. He is currently not interested to quit now.   Bilateral lower extremity edema This is related to morbid obesity and chronic congestive heart failure. He will continue diuretic therapy as directed.    Bilateral leg ulcer ([place])  He has recurrent skin infection, respiratory tract infection and cellulitis. He has chronic nonhealing wound/ulcer in his legs and has been going to the wound Center. According to the patient, he was told that his leg ulcers are improving. I will defer to them for further management   Orders Placed This Encounter  Procedures  . CBC with Differential/Platelet    Standing Status: Future     Number of Occurrences:      Standing Expiration Date: 07/29/2016  . Comprehensive metabolic panel    Standing Status: Future     Number of Occurrences:      Standing Expiration Date: 07/29/2016   All questions were answered. The patient knows to call the clinic with any problems, questions or concerns. No barriers to learning was detected. I spent 30 minutes counseling the patient face to face. The total time spent in the appointment was 40 minutes and more than 50% was on counseling and review of test results     [facility], [place], MD 06/25/2015 4:10 PM

## 2015-06-25 NOTE — Assessment & Plan Note (Signed)
His last PET scan showed near complete response to treatment. He has poor tolerance to chemotherapy with recurrent infection. I recommend discontinuation of chemotherapy and just go on maintenance rituximab only and he agreed with the plan. I will see him every 8 weeks with history, blood work, physical examination and rituximab. I plan to repeat imaging study in March 2017.

## 2015-06-25 NOTE — Assessment & Plan Note (Signed)
This is likely due to recent treatment. The patient denies recent history of bleeding such as epistaxis, hematuria or hematochezia. He is asymptomatic from the low platelet count. I will observe for now.  he does not require transfusion now. I will continue the chemotherapy at current dose without dosage adjustment.  If the thrombocytopenia gets progressive worse in the future, I might have to delay his treatment or adjust the chemotherapy dose.   

## 2015-06-25 NOTE — Progress Notes (Signed)
Port-a-cath accessed, flushes well, but only able to draw back appox 4 mls of blood. Pt cannot have Heparin, reviewed findings with Pharmacy. Pharmacy stated that patient should be able to tolerate Cath-Flow. Cath Flow  Instilled per policy.

## 2015-06-25 NOTE — Assessment & Plan Note (Signed)
He has recurrent skin infection, respiratory tract infection and cellulitis. He has chronic nonhealing wound/ulcer in his legs and has been going to the wound Center. According to the patient, he was told that his leg ulcers are improving. I will defer to them for further management

## 2015-06-30 ENCOUNTER — Ambulatory Visit (INDEPENDENT_AMBULATORY_CARE_PROVIDER_SITE_OTHER): Payer: BLUE CROSS/BLUE SHIELD | Admitting: Internal Medicine

## 2015-06-30 ENCOUNTER — Encounter: Payer: Self-pay | Admitting: Internal Medicine

## 2015-06-30 VITALS — BP 172/80 | HR 70 | Wt 350.0 lb

## 2015-06-30 DIAGNOSIS — L97919 Non-pressure chronic ulcer of unspecified part of right lower leg with unspecified severity: Secondary | ICD-10-CM

## 2015-06-30 DIAGNOSIS — I776 Arteritis, unspecified: Secondary | ICD-10-CM | POA: Diagnosis not present

## 2015-06-30 DIAGNOSIS — E1151 Type 2 diabetes mellitus with diabetic peripheral angiopathy without gangrene: Secondary | ICD-10-CM

## 2015-06-30 DIAGNOSIS — L97929 Non-pressure chronic ulcer of unspecified part of left lower leg with unspecified severity: Secondary | ICD-10-CM

## 2015-06-30 DIAGNOSIS — I87313 Chronic venous hypertension (idiopathic) with ulcer of bilateral lower extremity: Secondary | ICD-10-CM | POA: Diagnosis not present

## 2015-06-30 DIAGNOSIS — I5032 Chronic diastolic (congestive) heart failure: Secondary | ICD-10-CM

## 2015-06-30 DIAGNOSIS — I10 Essential (primary) hypertension: Secondary | ICD-10-CM

## 2015-06-30 DIAGNOSIS — C8238 Follicular lymphoma grade IIIa, lymph nodes of multiple sites: Secondary | ICD-10-CM

## 2015-06-30 MED ORDER — OXYCODONE-ACETAMINOPHEN 5-325 MG PO TABS: 1.0000 | ORAL_TABLET | Freq: Four times a day (QID) | ORAL | Status: DC | PRN

## 2015-06-30 NOTE — Assessment & Plan Note (Signed)
On Torsemide, Hydralazine Refusing CPAP

## 2015-06-30 NOTE — Progress Notes (Signed)
Subjective:  Patient ID: [name], male    DOB: 29-Sep-1955  Age: 59 y.o. MRN: [id number]  CC: No chief complaint on file.   HPI    [name] presents for  R LE ulcers, OSA, CHF, lymphoma on chemo, chronic hypertension, chronic dyslipidemia, type 2 diabetes controlled with medicines. Pt goes to the Wound Clinic.   Outpatient Prescriptions Prior to Visit  Medication Sig Dispense Refill  . albuterol (PROVENTIL) (2.5 MG/3ML) 0.083% nebulizer solution Take 3 mLs (2.5 mg total) by nebulization every 2 (two) hours as needed for wheezing. 75 mL 2  . ANORO ELLIPTA 62.5-25 MCG/INH AEPB Inhale 1 puff into the lungs daily as needed. For wheezing & SOB    . aspirin EC 81 MG tablet Take 81 mg by mouth daily.    . clonazePAM (KLONOPIN) 0.5 MG tablet TAKE 1 TABLET BY MOUTH TWICE A DAY AS NEEDED FOR ANXIETY or at hs for insomnia 180 tablet 5  . clopidogrel (PLAVIX) 75 MG tablet TAKE 1 TABLET BY MOUTH DAILY WITH BREAKFAST. 90 tablet 3  . metoprolol succinate (TOPROL-XL) 50 MG 24 hr tablet Take 100 mg by mouth daily.    . nitroGLYCERIN (NITROSTAT) 0.4 MG SL tablet Place 1 tablet (0.4 mg total) under the tongue every 5 (five) minutes as needed for chest pain (Up to 3 doses). 25 tablet 4  . potassium chloride SA (KLOR-CON M20) 20 MEQ tablet TAKE 1 TABLET BY MOUTH DAILY 90 tablet 3  . simvastatin (ZOCOR) 20 MG tablet TAKE 1 TABLET (20 MG TOTAL) BY MOUTH AT BEDTIME. 90 tablet 3  . torsemide (DEMADEX) 100 MG tablet Take 1 tablet (100 mg total) by mouth daily. 30 tablet 3  . oxyCODONE-acetaminophen (PERCOCET/ROXICET) 5-325 MG tablet Take 1 tablet by mouth every 6 (six) hours as needed for moderate pain. 120 tablet 0   No facility-administered medications prior to visit.    ROS Review of Systems  Constitutional: Negative for appetite change, fatigue and unexpected weight change.  HENT: Negative for congestion, nosebleeds, sneezing, sore throat and trouble swallowing.   Eyes: Negative for  itching and visual disturbance.  Respiratory: Positive for shortness of breath and wheezing. Negative for cough.   Cardiovascular: Negative for chest pain, palpitations and leg swelling.  Gastrointestinal: Negative for nausea, diarrhea, blood in stool and abdominal distention.  Genitourinary: Negative for frequency and hematuria.  Musculoskeletal: Negative for back pain, joint swelling, gait problem and neck pain.  Skin: Positive for color change, rash and wound. Negative for pallor.  Neurological: Negative for dizziness, tremors, speech difficulty, weakness and light-headedness.  Psychiatric/Behavioral: Negative for sleep disturbance, dysphoric mood and agitation. The patient is not nervous/anxious.     Objective:  BP 172/80 mmHg  Pulse 70  Wt 350 lb (158.759 kg)  SpO2 93%  BP Readings from Last 3 Encounters:  06/30/15 172/80  06/25/15 147/57  06/25/15 153/64    Wt Readings from Last 3 Encounters:  06/30/15 350 lb (158.759 kg)  06/25/15 353 lb 3.2 oz (160.21 kg)  04/30/15 352 lb 8 oz (159.893 kg)    Physical Exam  Constitutional: He is oriented to person, place, and time. He appears well-developed. No distress.  NAD  HENT:  Mouth/Throat: Oropharynx is clear and moist.  Eyes: Conjunctivae are normal. Pupils are equal, round, and reactive to light.  Neck: Normal range of motion. No JVD present. No thyromegaly present.  Cardiovascular: Normal rate, regular rhythm, normal heart sounds and intact distal pulses.  Exam reveals no  gallop and no friction rub.   No murmur heard. Pulmonary/Chest: Effort normal and breath sounds normal. No respiratory distress. He has no wheezes. He has no rales. He exhibits no tenderness.  Abdominal: Soft. Bowel sounds are normal. He exhibits no distension and no mass. There is no tenderness. There is no rebound and no guarding.  Musculoskeletal: Normal range of motion. He exhibits edema and tenderness.  Lymphadenopathy:    He has no cervical  adenopathy.  Neurological: He is alert and oriented to person, place, and time. He has normal reflexes. No cranial nerve deficit. He exhibits normal muscle tone. He displays a negative Romberg sign. Coordination and gait normal.  Skin: Skin is warm and dry. No rash noted.  Psychiatric: He has a normal mood and affect. His behavior is normal. Judgment and thought content normal.  Obese LEs dressed 1+ edema  Lab Results  Component Value Date   WBC 8.8 06/25/2015   HGB 16.0 06/25/2015   HCT 49.8 06/25/2015   PLT 114* 06/25/2015   GLUCOSE 134 06/25/2015   CHOL 131 03/25/2014   TRIG 185.0* 03/25/2014   HDL 24.00* 03/25/2014   LDLCALC 70 03/25/2014   ALT 16 06/25/2015   AST 15 06/25/2015   NA 141 06/25/2015   K 4.0 06/25/2015   CL 99 01/27/2015   CREATININE 0.7 06/25/2015   BUN 9.6 06/25/2015   CO2 34* 06/25/2015   TSH 2.01 02/25/2013   PSA 0.23 09/15/2011   INR 1.00 12/28/2014   HGBA1C 6.5 01/27/2015    Nm Pet Image Restag (ps) Skull Base To Thigh  04/29/2015  CLINICAL DATA:  Subsequent treatment strategy for history of follicular lymphoma. Restaging. EXAM: NUCLEAR MEDICINE PET SKULL BASE TO THIGH TECHNIQUE: 15.8 mCi F-18 FDG was injected intravenously. Full-ring PET imaging was performed from the skull base to thigh after the radiotracer. CT data was obtained and used for attenuation correction and anatomic localization. FASTING BLOOD GLUCOSE:  Value: 105 mg/dl COMPARISON:  12/25/2014 FINDINGS: NECK Resolution of previously described left supraclavicular nodal mass and hypermetabolism. Minimal non hypermetabolic soft tissue thickening remains in this area, measuring on the order of 11 mm. CHEST No residual thoracic nodal hypermetabolism identified. Note is made of hypermetabolism corresponding to a right lower lobe pulmonary artery branch. This measures a S.U.V. max of 3.3, including on approximately image 100. No pulmonary nodule or nodal correlate identified. ABDOMEN/PELVIS Resolution  of previously described retroperitoneal abdominal nodal hypermetabolism. A right inguinal node measures 8 mm and a S.U.V. max of 3.5. Nodes in this region on the prior measured up to 13 mm and a S.U.V. max of 4.2 (when remeasured). Right external iliac node measures 11 mm a S.U.V. max of 3.8 on image 188. Compare 13 mm and a S.U.V. of 2.5 on the prior exam. SKELETON A focus of skin thickening about the low anterior right pelvis corresponds to hypermetabolism. This measures a S.U.V. max of 2.8, including on image 211. This is likely a new since the prior exam.Relatively diffuse hyper metabolism throughout the marrow. No focal lesion identified. CT IMAGES PERFORMED FOR ATTENUATION CORRECTION Right maxillary sinus fluid level. Subcutaneous lesion about the right posterior neck is likely a sebaceous cyst at 2.5 cm. Cardiomegaly. Prior median sternotomy. Pulmonary artery enlargement at 3.7 cm. Bilateral pleural plaques, with calcification. hepatomegaly. Cholelithiasis. Bilateral nephrolithiasis. Decreased left periaortic abdominal nodes size at 1.0 cm today versus 1.6 cm on the prior. Degenerative partial fusion of the bilateral sacroiliac joints. IMPRESSION: 1. Response to therapy. Decrease size and  hypermetabolism of nodes. Residual disease suspected, including within the right hemipelvis. 2. Marrow hypermetabolism is diffuse and favored to be due to stimulation by chemotherapy. 3. Skin thickening and hypermetabolism about the low right pelvis is indeterminate and warrants physical exam correlation. 4. Bilateral pleural plaques with calcification, consistent with asbestos related pleural disease. 5. Cardiomegaly with prior median sternotomy. Pulmonary artery enlargement suggests pulmonary arterial hypertension. 6. Cholelithiasis and nephrolithiasis. 7. Mild hypermetabolism along the course of a right pulmonary artery branch to the lower lobe. No pulmonary parenchymal or nodal correlate in this area. This is  indeterminate and could be physiologic or represent small volume embolism after radiopharmaceutical injection. 8. Sinus disease Electronically Signed   By: Abigail Miyamoto M.D.   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 (HCC) -     CBC with Differential/Platelet; Future -     Basic metabolic panel; Future -     Hemoglobin A1c; Future -     Hepatic function panel; Future -     TSH; Future  Vasculitis (HCC) -     CBC with Differential/Platelet; Future -     Basic metabolic panel; Future -     Hemoglobin A1c; Future -     Hepatic function panel; Future -     TSH; Future  Controlled type 2 diabetes mellitus with diabetic peripheral angiopathy without gangrene, without long-term current use of insulin (HCC) -     CBC with Differential/Platelet; Future -     Basic metabolic panel; Future -     Hemoglobin A1c; Future -     Hepatic function panel; Future -     TSH; Future  Other orders -     oxyCODONE-acetaminophen (PERCOCET/ROXICET) 5-325 MG tablet; Take 1 tablet by mouth every 6 (six) hours as needed for severe pain.  I have changed Mr. Wild oxyCODONE-acetaminophen. I am also having him maintain his aspirin EC, nitroGLYCERIN, clopidogrel, potassium chloride SA, simvastatin, albuterol, metoprolol succinate, ANORO ELLIPTA, clonazePAM, and torsemide.  Meds ordered this encounter  Medications  . oxyCODONE-acetaminophen (PERCOCET/ROXICET) 5-325 MG tablet    Sig: Take 1  tablet by mouth every 6 (six) hours as needed for severe pain.    Dispense:  120 tablet    Refill:  0    Please fill on or after 07/08/15     Follow-up: Return in about 3 months (around 09/30/2015) for a follow-up visit.  Walker Kehr, MD

## 2015-06-30 NOTE — Assessment & Plan Note (Signed)
Wound Clinic

## 2015-06-30 NOTE — Progress Notes (Signed)
Pre visit review using our clinic review tool, if applicable. No additional management support is needed unless otherwise documented below in the visit note. 

## 2015-06-30 NOTE — Assessment & Plan Note (Signed)
Labs

## 2015-06-30 NOTE — Assessment & Plan Note (Signed)
On Torsemide, Hydralazine Refusing CPAP Loose wt NAS diet

## 2015-06-30 NOTE — Assessment & Plan Note (Signed)
On Chemo 

## 2015-07-12 DIAGNOSIS — I252 Old myocardial infarction: Secondary | ICD-10-CM | POA: Diagnosis not present

## 2015-07-12 DIAGNOSIS — I87311 Chronic venous hypertension (idiopathic) with ulcer of right lower extremity: Secondary | ICD-10-CM | POA: Diagnosis not present

## 2015-07-12 DIAGNOSIS — E785 Hyperlipidemia, unspecified: Secondary | ICD-10-CM | POA: Diagnosis not present

## 2015-07-12 DIAGNOSIS — I110 Hypertensive heart disease with heart failure: Secondary | ICD-10-CM | POA: Diagnosis not present

## 2015-07-12 DIAGNOSIS — I509 Heart failure, unspecified: Secondary | ICD-10-CM | POA: Diagnosis not present

## 2015-07-12 DIAGNOSIS — C8291 Follicular lymphoma, unspecified, lymph nodes of head, face, and neck: Secondary | ICD-10-CM | POA: Diagnosis not present

## 2015-07-12 DIAGNOSIS — E11622 Type 2 diabetes mellitus with other skin ulcer: Secondary | ICD-10-CM | POA: Diagnosis not present

## 2015-07-12 DIAGNOSIS — J449 Chronic obstructive pulmonary disease, unspecified: Secondary | ICD-10-CM | POA: Diagnosis not present

## 2015-07-12 DIAGNOSIS — Z9221 Personal history of antineoplastic chemotherapy: Secondary | ICD-10-CM | POA: Diagnosis not present

## 2015-07-12 DIAGNOSIS — E1140 Type 2 diabetes mellitus with diabetic neuropathy, unspecified: Secondary | ICD-10-CM | POA: Diagnosis not present

## 2015-07-12 DIAGNOSIS — I2510 Atherosclerotic heart disease of native coronary artery without angina pectoris: Secondary | ICD-10-CM | POA: Diagnosis not present

## 2015-07-12 DIAGNOSIS — L97811 Non-pressure chronic ulcer of other part of right lower leg limited to breakdown of skin: Secondary | ICD-10-CM | POA: Diagnosis not present

## 2015-07-23 ENCOUNTER — Other Ambulatory Visit: Payer: Self-pay | Admitting: Medical Oncology

## 2015-07-23 ENCOUNTER — Ambulatory Visit (HOSPITAL_BASED_OUTPATIENT_CLINIC_OR_DEPARTMENT_OTHER): Payer: BLUE CROSS/BLUE SHIELD

## 2015-07-23 VITALS — BP 165/75 | HR 74 | Temp 97.8°F | Resp 22

## 2015-07-23 DIAGNOSIS — Z95828 Presence of other vascular implants and grafts: Secondary | ICD-10-CM

## 2015-07-23 DIAGNOSIS — C8230 Follicular lymphoma grade IIIa, unspecified site: Secondary | ICD-10-CM | POA: Diagnosis not present

## 2015-07-23 DIAGNOSIS — Z452 Encounter for adjustment and management of vascular access device: Secondary | ICD-10-CM

## 2015-07-23 MED ORDER — SODIUM CHLORIDE 0.9 % IJ SOLN
10.0000 mL | INTRAMUSCULAR | Status: DC | PRN
  Filled 2015-07-23: qty 10

## 2015-07-23 MED ORDER — SODIUM CHLORIDE 0.9 % IJ SOLN
10.0000 mL | INTRAMUSCULAR | Status: DC | PRN
  Administered 2015-07-23: 10 mL via INTRAVENOUS
  Filled 2015-07-23: qty 10

## 2015-07-23 MED ORDER — ANTICOAGULANT SODIUM CITRATE 4% (200MG/5ML) IV SOLN
5.0000 mL | Freq: Once | Status: DC
  Administered 2015-07-23: 5 mL via INTRAVENOUS
  Filled 2015-07-23: qty 5

## 2015-07-23 NOTE — Patient Instructions (Signed)

## 2015-08-02 ENCOUNTER — Encounter (HOSPITAL_BASED_OUTPATIENT_CLINIC_OR_DEPARTMENT_OTHER): Payer: BLUE CROSS/BLUE SHIELD | Attending: Plastic Surgery

## 2015-08-02 DIAGNOSIS — I110 Hypertensive heart disease with heart failure: Secondary | ICD-10-CM | POA: Diagnosis not present

## 2015-08-02 DIAGNOSIS — E11622 Type 2 diabetes mellitus with other skin ulcer: Secondary | ICD-10-CM | POA: Diagnosis present

## 2015-08-02 DIAGNOSIS — I509 Heart failure, unspecified: Secondary | ICD-10-CM | POA: Insufficient documentation

## 2015-08-02 DIAGNOSIS — Z9221 Personal history of antineoplastic chemotherapy: Secondary | ICD-10-CM | POA: Diagnosis not present

## 2015-08-02 DIAGNOSIS — E1140 Type 2 diabetes mellitus with diabetic neuropathy, unspecified: Secondary | ICD-10-CM | POA: Diagnosis not present

## 2015-08-02 DIAGNOSIS — J449 Chronic obstructive pulmonary disease, unspecified: Secondary | ICD-10-CM | POA: Diagnosis not present

## 2015-08-02 DIAGNOSIS — I252 Old myocardial infarction: Secondary | ICD-10-CM | POA: Insufficient documentation

## 2015-08-02 DIAGNOSIS — I872 Venous insufficiency (chronic) (peripheral): Secondary | ICD-10-CM | POA: Insufficient documentation

## 2015-08-02 DIAGNOSIS — I2510 Atherosclerotic heart disease of native coronary artery without angina pectoris: Secondary | ICD-10-CM | POA: Insufficient documentation

## 2015-08-02 DIAGNOSIS — Z872 Personal history of diseases of the skin and subcutaneous tissue: Secondary | ICD-10-CM | POA: Insufficient documentation

## 2015-08-02 DIAGNOSIS — Z09 Encounter for follow-up examination after completed treatment for conditions other than malignant neoplasm: Secondary | ICD-10-CM | POA: Insufficient documentation

## 2015-08-20 ENCOUNTER — Ambulatory Visit (HOSPITAL_BASED_OUTPATIENT_CLINIC_OR_DEPARTMENT_OTHER): Payer: BLUE CROSS/BLUE SHIELD | Admitting: Hematology and Oncology

## 2015-08-20 ENCOUNTER — Telehealth: Payer: Self-pay | Admitting: Hematology and Oncology

## 2015-08-20 ENCOUNTER — Ambulatory Visit (HOSPITAL_BASED_OUTPATIENT_CLINIC_OR_DEPARTMENT_OTHER): Payer: BLUE CROSS/BLUE SHIELD

## 2015-08-20 ENCOUNTER — Encounter: Payer: Self-pay | Admitting: Hematology and Oncology

## 2015-08-20 ENCOUNTER — Ambulatory Visit: Payer: BLUE CROSS/BLUE SHIELD

## 2015-08-20 ENCOUNTER — Other Ambulatory Visit (HOSPITAL_BASED_OUTPATIENT_CLINIC_OR_DEPARTMENT_OTHER): Payer: BLUE CROSS/BLUE SHIELD

## 2015-08-20 VITALS — BP 133/62 | HR 65 | Temp 98.3°F | Resp 20

## 2015-08-20 VITALS — BP 123/50 | HR 78 | Temp 98.7°F | Resp 21 | Wt 353.1 lb

## 2015-08-20 DIAGNOSIS — Z95828 Presence of other vascular implants and grafts: Secondary | ICD-10-CM

## 2015-08-20 DIAGNOSIS — L02429 Furuncle of limb, unspecified: Secondary | ICD-10-CM

## 2015-08-20 DIAGNOSIS — L02423 Furuncle of right upper limb: Secondary | ICD-10-CM

## 2015-08-20 DIAGNOSIS — I5032 Chronic diastolic (congestive) heart failure: Secondary | ICD-10-CM | POA: Diagnosis not present

## 2015-08-20 DIAGNOSIS — C8239 Follicular lymphoma grade IIIa, extranodal and solid organ sites: Secondary | ICD-10-CM

## 2015-08-20 DIAGNOSIS — C8238 Follicular lymphoma grade IIIa, lymph nodes of multiple sites: Secondary | ICD-10-CM

## 2015-08-20 DIAGNOSIS — Z720 Tobacco use: Secondary | ICD-10-CM

## 2015-08-20 DIAGNOSIS — Z5112 Encounter for antineoplastic immunotherapy: Secondary | ICD-10-CM | POA: Diagnosis not present

## 2015-08-20 DIAGNOSIS — D696 Thrombocytopenia, unspecified: Secondary | ICD-10-CM | POA: Diagnosis not present

## 2015-08-20 LAB — CBC WITH DIFFERENTIAL/PLATELET
BASO%: 0.6 % (ref 0.0–2.0)
BASOS ABS: 0.1 10*3/uL (ref 0.0–0.1)
EOS%: 3 % (ref 0.0–7.0)
Eosinophils Absolute: 0.3 10*3/uL (ref 0.0–0.5)
HCT: 48.8 % (ref 38.4–49.9)
HEMOGLOBIN: 15.8 g/dL (ref 13.0–17.1)
LYMPH#: 0.7 10*3/uL — AB (ref 0.9–3.3)
LYMPH%: 8 % — ABNORMAL LOW (ref 14.0–49.0)
MCH: 31.5 pg (ref 27.2–33.4)
MCHC: 32.4 g/dL (ref 32.0–36.0)
MCV: 97.2 fL (ref 79.3–98.0)
MONO#: 0.9 10*3/uL (ref 0.1–0.9)
MONO%: 10.3 % (ref 0.0–14.0)
NEUT%: 78.1 % — ABNORMAL HIGH (ref 39.0–75.0)
NEUTROS ABS: 6.9 10*3/uL — AB (ref 1.5–6.5)
NRBC: 0 % (ref 0–0)
Platelets: 126 10*3/uL — ABNORMAL LOW (ref 140–400)
RBC: 5.02 10*6/uL (ref 4.20–5.82)
RDW: 14.8 % — AB (ref 11.0–14.6)
WBC: 8.9 10*3/uL (ref 4.0–10.3)

## 2015-08-20 LAB — COMPREHENSIVE METABOLIC PANEL
ALBUMIN: 3.7 g/dL (ref 3.5–5.0)
ALK PHOS: 98 U/L (ref 40–150)
ALT: 25 U/L (ref 0–55)
AST: 22 U/L (ref 5–34)
Anion Gap: 9 mEq/L (ref 3–11)
BILIRUBIN TOTAL: 0.48 mg/dL (ref 0.20–1.20)
BUN: 15 mg/dL (ref 7.0–26.0)
CO2: 31 meq/L — AB (ref 22–29)
CREATININE: 0.8 mg/dL (ref 0.7–1.3)
Calcium: 8.9 mg/dL (ref 8.4–10.4)
Chloride: 97 mEq/L — ABNORMAL LOW (ref 98–109)
GLUCOSE: 212 mg/dL — AB (ref 70–140)
Potassium: 4.1 mEq/L (ref 3.5–5.1)
SODIUM: 138 meq/L (ref 136–145)
TOTAL PROTEIN: 6.9 g/dL (ref 6.4–8.3)

## 2015-08-20 MED ORDER — DIPHENHYDRAMINE HCL 25 MG PO CAPS
50.0000 mg | ORAL_CAPSULE | Freq: Once | ORAL | Status: AC
Start: 2015-08-20 — End: 2015-08-20
  Administered 2015-08-20: 50 mg via ORAL

## 2015-08-20 MED ORDER — ANTICOAGULANT SODIUM CITRATE 4% (200MG/5ML) IV SOLN
5.0000 mL | Freq: Once | Status: AC | PRN
  Administered 2015-08-20: 5 mL via INTRAVENOUS
  Filled 2015-08-20: qty 5

## 2015-08-20 MED ORDER — SODIUM CHLORIDE 0.9 % IV SOLN
375.0000 mg/m2 | Freq: Once | INTRAVENOUS | Status: AC
  Administered 2015-08-20: 1100 mg via INTRAVENOUS
  Filled 2015-08-20: qty 110

## 2015-08-20 MED ORDER — ACETAMINOPHEN 325 MG PO TABS
ORAL_TABLET | ORAL | Status: AC
  Filled 2015-08-20: qty 2

## 2015-08-20 MED ORDER — ACETAMINOPHEN 325 MG PO TABS
650.0000 mg | ORAL_TABLET | Freq: Once | ORAL | Status: AC
  Administered 2015-08-20: 650 mg via ORAL

## 2015-08-20 MED ORDER — SODIUM CHLORIDE 0.9 % IJ SOLN
10.0000 mL | INTRAMUSCULAR | Status: DC | PRN
  Administered 2015-08-20: 10 mL via INTRAVENOUS
  Filled 2015-08-20: qty 10

## 2015-08-20 MED ORDER — SODIUM CHLORIDE 0.9 % IJ SOLN
10.0000 mL | INTRAMUSCULAR | Status: DC | PRN
  Administered 2015-08-20: 10 mL
  Filled 2015-08-20: qty 10

## 2015-08-20 MED ORDER — SODIUM CHLORIDE 0.9 % IV SOLN
Freq: Once | INTRAVENOUS | Status: AC
  Administered 2015-08-20: 09:00:00 via INTRAVENOUS

## 2015-08-20 MED ORDER — DIPHENHYDRAMINE HCL 25 MG PO CAPS
ORAL_CAPSULE | ORAL | Status: AC
  Filled 2015-08-20: qty 2

## 2015-08-20 NOTE — Assessment & Plan Note (Signed)
The patient have recurrent skin infection but no evidence of cellulitis. He tends to pick on his skin and I think that his habit predispose him to recurrent skin infection. I do not recommend chronic antibiotic therapy as these may promote antibiotic resistance. Recommend conservative treatment only.

## 2015-08-20 NOTE — Assessment & Plan Note (Signed)
This is likely due to recent treatment. The patient denies recent history of bleeding such as epistaxis, hematuria or hematochezia. He is asymptomatic from the low platelet count. I will observe for now.  

## 2015-08-20 NOTE — Telephone Encounter (Signed)
per pof to sch pt appt-gave pt copy of avs-sent MW email to sch trmt-pt to get updated sch b4 leaving

## 2015-08-20 NOTE — Patient Instructions (Signed)
[facility] Discharge Instructions for Patients Receiving Chemotherapy  Today you received the following chemotherapy agents Rituxan  To help prevent nausea and vomiting after your treatment, we encourage you to take your nausea medication    If you develop nausea and vomiting that is not controlled by your nausea medication, call the clinic.   BELOW ARE SYMPTOMS THAT SHOULD BE REPORTED IMMEDIATELY:  *FEVER GREATER THAN 100.5 F  *CHILLS WITH OR WITHOUT FEVER  NAUSEA AND VOMITING THAT IS NOT CONTROLLED WITH YOUR NAUSEA MEDICATION  *UNUSUAL SHORTNESS OF BREATH  *UNUSUAL BRUISING OR BLEEDING  TENDERNESS IN MOUTH AND THROAT WITH OR WITHOUT PRESENCE OF ULCERS  *URINARY PROBLEMS  *BOWEL PROBLEMS  UNUSUAL RASH Items with * indicate a potential emergency and should be followed up as soon as possible.  Feel free to call the clinic you have any questions or concerns. The clinic phone number is [phone].  Please show the CHEMO ALERT CARD at check-in to the Emergency Department and triage nurse.   

## 2015-08-20 NOTE — Assessment & Plan Note (Signed)
I spent some time counseling the patient the importance of tobacco cessation. He is currently not interested to quit now.  

## 2015-08-20 NOTE — Progress Notes (Signed)
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 [facility])   06/24/2009 Imaging PET CT scan showed two small FDG positive left neck nodes   12/11/2014 Surgery He underwent excisional lymph node biopsy of the left supraclavicular lymph node/neck region   12/11/2014 Pathology Results Accession: [id number] biopsy show follicular lymphoma   [id number] Imaging ECHO showed LVH but preserved EF   12/25/2014 Imaging PET scan showed disease above and below diaphragm   12/28/2014 Procedure He has port placement   12/31/2014 - 01/01/2015 Chemotherapy He received 1 cycle of bendamustine with rituximab, discontinued due to suspicion of allergic reaction to bendamustine   01/12/2015 - 01/19/2015 Hospital Admission He was admitted to the hospital with suspicious allergic reaction, significant bilateral lower extremity edema with cellulitis, pneumonia and mild fluid overload   03/18/2015 - 04/08/2015 Chemotherapy Treatment is switched to rituximab, Cytoxan, vincristine and prednisone   04/29/2015 Imaging  PET CT scan showed near complete response to treatment.   04/30/2015 -  Chemotherapy He received maintenance Rituxan    INTERVAL HISTORY: Please see below for problem oriented charting. He feels well without recent COPD exacerbation. His leg ulcers finally healed. The patient have recurrent boils/skin infection. He has been picking on his skin and lancing them himself. He had a new one in the right upper extremity now. Denies  fevers or chills. No new lymphadenopathy. Appetite stable and he denies recent weight loss.  REVIEW OF SYSTEMS:   Constitutional: Denies fevers, chills or abnormal weight loss Eyes: Denies blurriness of vision Ears, nose, mouth, throat, and face: Denies mucositis or sore throat Respiratory: Denies cough, dyspnea or wheezes Cardiovascular: Denies palpitation, chest discomfort or lower extremity swelling Gastrointestinal:  Denies nausea, heartburn or change in bowel habits Lymphatics: Denies new lymphadenopathy or easy bruising Neurological:Denies numbness, tingling or new weaknesses Behavioral/Psych: Mood is stable, no new changes  All other systems were reviewed with the patient and are negative.  I have reviewed the past medical history, past surgical history, social history and family history with the patient and they are unchanged from previous note.  ALLERGIES:  is allergic to bendamustine hcl; heparin; and tape.  MEDICATIONS:  Current Outpatient Prescriptions  Medication Sig Dispense Refill  . albuterol (PROVENTIL) (2.5 MG/3ML) 0.083% nebulizer solution Take 3 mLs (2.5 mg total) by nebulization every 2 (two) hours as needed for wheezing. 75 mL 2  . ANORO ELLIPTA 62.5-25 MCG/INH AEPB Inhale 1 puff into the lungs daily as needed. For wheezing & SOB    . aspirin EC 81 MG tablet Take 81 mg by mouth daily.    . clonazePAM (KLONOPIN) 0.5 MG tablet TAKE 1 TABLET BY MOUTH TWICE A DAY AS NEEDED FOR ANXIETY or at hs for insomnia 180 tablet 5  . clopidogrel (PLAVIX) 75 MG tablet TAKE 1 TABLET BY MOUTH DAILY WITH BREAKFAST. 90 tablet 3  . metoprolol succinate (TOPROL-XL) 50 MG 24 hr tablet Take 100 mg by mouth daily.    . nitroGLYCERIN (NITROSTAT) 0.4 MG SL tablet Place 1 tablet (0.4 mg total) under the tongue  every 5 (five) minutes as needed for chest pain (Up to 3 doses). 25 tablet 4  . oxyCODONE-acetaminophen (PERCOCET/ROXICET) 5-325 MG tablet Take 1 tablet by mouth every 6 (six) hours as  needed for severe pain. 120 tablet 0  . potassium chloride SA (KLOR-CON M20) 20 MEQ tablet TAKE 1 TABLET BY MOUTH DAILY 90 tablet 3  . simvastatin (ZOCOR) 20 MG tablet TAKE 1 TABLET (20 MG TOTAL) BY MOUTH AT BEDTIME. 90 tablet 3  . torsemide (DEMADEX) 100 MG tablet Take 1 tablet (100 mg total) by mouth daily. 30 tablet 3   No current facility-administered medications for this visit.   Facility-Administered Medications Ordered in Other Visits  Medication Dose Route Frequency Provider Last Rate Last Dose  . anticoagulant sodium citrate solution 5 mL  5 mL Intravenous Once PRN Heath Lark, MD      . riTUXimab (RITUXAN) 1,100 mg in sodium chloride 0.9 % 250 mL (3.0556 mg/mL) chemo infusion  375 mg/m2 (Treatment Plan Actual) Intravenous Once Heath Lark, MD      . sodium chloride 0.9 % injection 10 mL  10 mL Intracatheter PRN Heath Lark, MD        PHYSICAL EXAMINATION: ECOG PERFORMANCE STATUS: 1 - Symptomatic but completely ambulatory  Filed Vitals:   08/20/15 0827  BP: 123/50  Pulse: 78  Temp: 98.7 F (37.1 C)  Resp: 21   Filed Weights   08/20/15 0827  Weight: 353 lb 1.6 oz (160.165 kg)    GENERAL:alert, no distress and comfortable. He is morbidly obese SKIN: Noted a boil on the right upper extremity. No evidence of cellulitis. His lower extremity ulcers had healed  EYES: normal, Conjunctiva are pink and non-injected, sclera clear OROPHARYNX:no exudate, no erythema and lips, buccal mucosa, and tongue normal  NECK: supple, thyroid normal size, non-tender, without nodularity LYMPH:  no palpable lymphadenopathy in the cervical, axillary or inguinal LUNGS: clear to auscultation and percussion with normal breathing effort HEART: regular rate & rhythm and no murmurs with mild-to-moderate bilateral lower extremity edema ABDOMEN:abdomen soft, non-tender and normal bowel sounds Musculoskeletal:no cyanosis of digits and no clubbing  NEURO: alert & oriented x 3 with fluent speech, no focal  motor/sensory deficits  LABORATORY DATA:  I have reviewed the data as listed    Component Value Date/Time   NA 138 08/20/2015 0804   NA 139 01/27/2015 1524   K 4.1 08/20/2015 0804   K 3.7 01/27/2015 1524   CL 99 01/27/2015 1524   CO2 31* 08/20/2015 0804   CO2 36* 01/27/2015 1524   GLUCOSE 212* 08/20/2015 0804   GLUCOSE 99 01/27/2015 1524   BUN 15.0 08/20/2015 0804   BUN 25* 01/27/2015 1524   CREATININE 0.8 08/20/2015 0804   CREATININE 1.05 01/27/2015 1524   CALCIUM 8.9 08/20/2015 0804   CALCIUM 9.2 01/27/2015 1524   PROT 6.9 08/20/2015 0804   PROT 6.6 01/15/2015 0500   ALBUMIN 3.7 08/20/2015 0804   ALBUMIN 3.1* 01/15/2015 0500   AST 22 08/20/2015 0804   AST 22 01/15/2015 0500   ALT 25 08/20/2015 0804   ALT 27 01/15/2015 0500   ALKPHOS 98 08/20/2015 0804   ALKPHOS 57 01/15/2015 0500   BILITOT 0.48 08/20/2015 0804   BILITOT 0.6 01/15/2015 0500   GFRNONAA >60 01/18/2015 0320   GFRAA >60 01/18/2015 0320    No results found for: SPEP, UPEP  Lab Results  Component Value Date   WBC 8.9 08/20/2015   NEUTROABS 6.9* 08/20/2015   HGB 15.8 08/20/2015  HCT 48.8 08/20/2015   MCV 97.2 08/20/2015   PLT 126* 08/20/2015      Chemistry      Component Value Date/Time   NA 138 08/20/2015 0804   NA 139 01/27/2015 1524   K 4.1 08/20/2015 0804   K 3.7 01/27/2015 1524   CL 99 01/27/2015 1524   CO2 31* 08/20/2015 0804   CO2 36* 01/27/2015 1524   BUN 15.0 08/20/2015 0804   BUN 25* 01/27/2015 1524   CREATININE 0.8 08/20/2015 0804   CREATININE 1.05 01/27/2015 1524      Component Value Date/Time   CALCIUM 8.9 08/20/2015 0804   CALCIUM 9.2 01/27/2015 1524   ALKPHOS 98 08/20/2015 0804   ALKPHOS 57 01/15/2015 0500   AST 22 08/20/2015 0804   AST 22 01/15/2015 0500   ALT 25 08/20/2015 0804   ALT 27 01/15/2015 0500   BILITOT 0.48 08/20/2015 0804   BILITOT 0.6 01/15/2015 0500      ASSESSMENT & PLAN:  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 He has poor tolerance to chemotherapy with recurrent infection. I recommend discontinuation of chemotherapy and just go on maintenance rituximab only and he agreed with the plan. I will see him every 8 weeks with history, blood work, physical examination and rituximab. I plan to repeat imaging study in March 2017.  Thrombocytopenia ([place]) This is likely due to recent treatment. The patient denies recent history of bleeding such as epistaxis, hematuria or hematochezia. He is asymptomatic from the low platelet count. I will observe for now.    Chronic diastolic CHF (congestive heart failure) The patient has no evidence of mild fluid overload. Continue medical management   Boil of upper extremity The patient have recurrent skin infection but no evidence of cellulitis. He tends to pick on his skin and I think that his habit predispose him to recurrent skin infection. I do not recommend chronic antibiotic therapy as these may promote antibiotic resistance. Recommend conservative treatment only.  Tobacco abuse I spent some time counseling the patient the importance of tobacco cessation. He is currently not interested to quit now.      Orders Placed This Encounter  Procedures  . NM PET Image Restag (PS) Skull Base To Thigh    Standing Status: Future     Number of Occurrences:      Standing Expiration Date: 10/19/2016    Order Specific Question:  Reason for Exam (SYMPTOM  OR DIAGNOSIS REQUIRED)    Answer:  follicluar lymphoma, assess response to Rx    Order Specific Question:  Preferred imaging location?    Answer:  [facility]  . Comprehensive metabolic panel    Standing Status: Future     Number of Occurrences:      Standing Expiration Date: 09/23/2016  . CBC with Differential/Platelet    Standing Status: Future     Number of Occurrences:      Standing Expiration Date: 09/23/2016  . Lactate dehydrogenase (LDH) -  [facility]    Standing Status: Future     Number of Occurrences:      Standing Expiration Date: 09/23/2016   All questions were answered. The patient knows to call the clinic with any problems, questions or concerns. No barriers to learning was detected. I spent 30 minutes counseling the patient face to face. The total time spent in the appointment was 40 minutes and more than 50% was on counseling  and review of test results     [facility], Sondra Blixt, MD 08/20/2015 9:36 AM

## 2015-08-20 NOTE — Assessment & Plan Note (Signed)
The patient has no evidence of mild fluid overload. Continue medical management

## 2015-08-20 NOTE — Patient Instructions (Signed)

## 2015-08-20 NOTE — Assessment & Plan Note (Signed)
His last PET scan showed near complete response to treatment. He has poor tolerance to chemotherapy with recurrent infection. I recommend discontinuation of chemotherapy and just go on maintenance rituximab only and he agreed with the plan. I will see him every 8 weeks with history, blood work, physical examination and rituximab. I plan to repeat imaging study in March 2017.

## 2015-08-26 ENCOUNTER — Other Ambulatory Visit: Payer: Self-pay | Admitting: Internal Medicine

## 2015-08-27 ENCOUNTER — Other Ambulatory Visit: Payer: Self-pay | Admitting: *Deleted

## 2015-08-27 MED ORDER — ANORO ELLIPTA 62.5-25 MCG/INH IN AEPB: 1.0000 | INHALATION_SPRAY | Freq: Every day | RESPIRATORY_TRACT | Status: DC | PRN

## 2015-09-15 ENCOUNTER — Other Ambulatory Visit: Payer: Self-pay | Admitting: Internal Medicine

## 2015-09-16 ENCOUNTER — Other Ambulatory Visit: Payer: Self-pay | Admitting: Internal Medicine

## 2015-09-17 ENCOUNTER — Ambulatory Visit (HOSPITAL_BASED_OUTPATIENT_CLINIC_OR_DEPARTMENT_OTHER): Payer: BLUE CROSS/BLUE SHIELD

## 2015-09-17 VITALS — BP 159/58 | HR 67 | Temp 98.4°F | Resp 18

## 2015-09-17 DIAGNOSIS — Z452 Encounter for adjustment and management of vascular access device: Secondary | ICD-10-CM | POA: Diagnosis not present

## 2015-09-17 DIAGNOSIS — C8238 Follicular lymphoma grade IIIa, lymph nodes of multiple sites: Secondary | ICD-10-CM | POA: Diagnosis not present

## 2015-09-17 DIAGNOSIS — Z95828 Presence of other vascular implants and grafts: Secondary | ICD-10-CM

## 2015-09-17 DIAGNOSIS — C8239 Follicular lymphoma grade IIIa, extranodal and solid organ sites: Secondary | ICD-10-CM

## 2015-09-17 MED ORDER — SODIUM CHLORIDE 0.9% FLUSH
10.0000 mL | INTRAVENOUS | Status: DC | PRN
  Administered 2015-09-17: 10 mL via INTRAVENOUS
  Filled 2015-09-17: qty 10

## 2015-09-17 MED ORDER — ANTICOAGULANT SODIUM CITRATE 4% (200MG/5ML) IV SOLN
5.0000 mL | Freq: Once | Status: AC | PRN
  Administered 2015-09-17: 5 mL via INTRAVENOUS
  Filled 2015-09-17: qty 5

## 2015-09-17 NOTE — Patient Instructions (Signed)

## 2015-09-17 NOTE — Telephone Encounter (Signed)
Done

## 2015-09-28 ENCOUNTER — Encounter: Payer: Self-pay | Admitting: Internal Medicine

## 2015-09-28 ENCOUNTER — Telehealth: Payer: Self-pay

## 2015-09-28 ENCOUNTER — Other Ambulatory Visit (INDEPENDENT_AMBULATORY_CARE_PROVIDER_SITE_OTHER): Payer: BLUE CROSS/BLUE SHIELD

## 2015-09-28 ENCOUNTER — Ambulatory Visit (INDEPENDENT_AMBULATORY_CARE_PROVIDER_SITE_OTHER): Payer: BLUE CROSS/BLUE SHIELD | Admitting: Internal Medicine

## 2015-09-28 VITALS — BP 154/70 | HR 76 | Wt 346.0 lb

## 2015-09-28 DIAGNOSIS — M79605 Pain in left leg: Secondary | ICD-10-CM | POA: Insufficient documentation

## 2015-09-28 DIAGNOSIS — M545 Low back pain, unspecified: Secondary | ICD-10-CM

## 2015-09-28 DIAGNOSIS — E1151 Type 2 diabetes mellitus with diabetic peripheral angiopathy without gangrene: Secondary | ICD-10-CM

## 2015-09-28 DIAGNOSIS — I5032 Chronic diastolic (congestive) heart failure: Secondary | ICD-10-CM

## 2015-09-28 DIAGNOSIS — L97929 Non-pressure chronic ulcer of unspecified part of left lower leg with unspecified severity: Secondary | ICD-10-CM

## 2015-09-28 DIAGNOSIS — I87313 Chronic venous hypertension (idiopathic) with ulcer of bilateral lower extremity: Secondary | ICD-10-CM

## 2015-09-28 DIAGNOSIS — M79604 Pain in right leg: Secondary | ICD-10-CM | POA: Insufficient documentation

## 2015-09-28 DIAGNOSIS — L97919 Non-pressure chronic ulcer of unspecified part of right lower leg with unspecified severity: Secondary | ICD-10-CM

## 2015-09-28 LAB — BASIC METABOLIC PANEL
BUN: 13 mg/dL (ref 6–23)
CHLORIDE: 97 meq/L (ref 96–112)
CO2: 37 mEq/L — ABNORMAL HIGH (ref 19–32)
Calcium: 9.7 mg/dL (ref 8.4–10.5)
Creatinine, Ser: 0.87 mg/dL (ref 0.40–1.50)
GFR: 94.63 mL/min (ref >60.00)
Glucose, Bld: 107 mg/dL — ABNORMAL HIGH (ref 70–99)
POTASSIUM: 4.4 meq/L (ref 3.5–5.1)
SODIUM: 139 meq/L (ref 135–145)

## 2015-09-28 LAB — HEMOGLOBIN A1C: HEMOGLOBIN A1C: 6.4 % (ref 4.6–6.5)

## 2015-09-28 MED ORDER — ALBUTEROL SULFATE (2.5 MG/3ML) 0.083% IN NEBU: 2.5000 mg | INHALATION_SOLUTION | RESPIRATORY_TRACT | Status: DC | PRN

## 2015-09-28 MED ORDER — OXYCODONE-ACETAMINOPHEN 5-325 MG PO TABS: 1.0000 | ORAL_TABLET | Freq: Four times a day (QID) | ORAL | Status: DC | PRN

## 2015-09-28 NOTE — Progress Notes (Signed)
Subjective:  Patient ID: [name], male    DOB: 1956-08-01  Age: 60 y.o. MRN: [id number]  CC: No chief complaint on file.   HPI CRAIG WESTERMANN presents for OA, edema, COPD f/up. Edema is better - wounds are healed. C/o URI - better  Outpatient Prescriptions Prior to Visit  Medication Sig Dispense Refill  . ANORO ELLIPTA 62.5-25 MCG/INH AEPB Inhale 1 puff into the lungs daily as needed. For wheezing & SOB 60 each 5  . aspirin EC 81 MG tablet Take 81 mg by mouth daily.    . clonazePAM (KLONOPIN) 0.5 MG tablet TAKE 1 TABLET TWICE A DAY AS NEEDED FOR ANXIETY OR AT BEDTME FOR INSOMNIA 180 tablet 1  . clopidogrel (PLAVIX) 75 MG tablet TAKE 1 TABLET DAILY WITH   BREAKFAST 90 tablet 3  . KLOR-CON M20 20 MEQ tablet TAKE 1 TABLET DAILY 90 tablet 1  . metoprolol succinate (TOPROL-XL) 50 MG 24 hr tablet Take 100 mg by mouth daily.    . simvastatin (ZOCOR) 20 MG tablet TAKE 1 TABLET AT BEDTIME 90 tablet 3  . torsemide (DEMADEX) 100 MG tablet Take 1 tablet (100 mg total) by mouth daily. 30 tablet 3  . albuterol (PROVENTIL) (2.5 MG/3ML) 0.083% nebulizer solution Take 3 mLs (2.5 mg total) by nebulization every 2 (two) hours as needed for wheezing. 75 mL 2  . oxyCODONE-acetaminophen (PERCOCET/ROXICET) 5-325 MG tablet Take 1 tablet by mouth every 6 (six) hours as needed for severe pain. 120 tablet 0  . nitroGLYCERIN (NITROSTAT) 0.4 MG SL tablet Place 1 tablet (0.4 mg total) under the tongue every 5 (five) minutes as needed for chest pain (Up to 3 doses). 25 tablet 4   No facility-administered medications prior to visit.    ROS Review of Systems  Constitutional: Positive for fatigue. Negative for appetite change and unexpected weight change.  HENT: Negative for congestion, nosebleeds, sneezing, sore throat and trouble swallowing.   Eyes: Negative for itching and visual disturbance.  Respiratory: Negative for cough and wheezing.   Cardiovascular: Positive for leg swelling. Negative for chest  pain and palpitations.  Gastrointestinal: Negative for nausea, diarrhea, blood in stool and abdominal distention.  Genitourinary: Negative for frequency and hematuria.  Musculoskeletal: Negative for back pain, joint swelling, gait problem and neck pain.  Skin: Negative for rash.  Neurological: Negative for dizziness, tremors, speech difficulty and weakness.  Psychiatric/Behavioral: Positive for sleep disturbance. Negative for dysphoric mood and agitation. The patient is nervous/anxious.     Objective:  BP 154/70 mmHg  Pulse 76  Wt 346 lb (156.945 kg)  SpO2 90%  BP Readings from Last 3 Encounters:  09/28/15 154/70  09/17/15 159/58  08/20/15 133/62    Wt Readings from Last 3 Encounters:  09/28/15 346 lb (156.945 kg)  08/20/15 353 lb 1.6 oz (160.165 kg)  06/30/15 350 lb (158.759 kg)    Physical Exam  Constitutional: He is oriented to person, place, and time. He appears well-developed. No distress.  NAD  HENT:  Mouth/Throat: Oropharynx is clear and moist.  Eyes: Conjunctivae are normal. Pupils are equal, round, and reactive to light.  Neck: Normal range of motion. No JVD present. No thyromegaly present.  Cardiovascular: Normal rate, regular rhythm, normal heart sounds and intact distal pulses.  Exam reveals no gallop and no friction rub.   No murmur heard. Pulmonary/Chest: Effort normal and breath sounds normal. No respiratory distress. He has no wheezes. He has no rales. He exhibits no tenderness.  Abdominal: Soft.  Bowel sounds are normal. He exhibits no distension and no mass. There is no tenderness. There is no rebound and no guarding.  Musculoskeletal: Normal range of motion. He exhibits edema. He exhibits no tenderness.  Lymphadenopathy:    He has no cervical adenopathy.  Neurological: He is alert and oriented to person, place, and time. He has normal reflexes. No cranial nerve deficit. He exhibits normal muscle tone. He displays a negative Romberg sign. Coordination and  gait normal.  Skin: Skin is warm and dry. No rash noted. No erythema. No pallor.  Psychiatric: He has a normal mood and affect. His behavior is normal. Judgment and thought content normal.  1+ edema B - better  Lab Results  Component Value Date   WBC 8.9 08/20/2015   HGB 15.8 08/20/2015   HCT 48.8 08/20/2015   PLT 126* 08/20/2015   GLUCOSE 107* 09/28/2015   CHOL 131 03/25/2014   TRIG 185.0* 03/25/2014   HDL 24.00* 03/25/2014   LDLCALC 70 03/25/2014   ALT 25 08/20/2015   AST 22 08/20/2015   NA 139 09/28/2015   K 4.4 09/28/2015   CL 97 09/28/2015   CREATININE 0.87 09/28/2015   BUN 13 09/28/2015   CO2 37* 09/28/2015   TSH 2.01 02/25/2013   PSA 0.23 09/15/2011   INR 1.00 12/28/2014   HGBA1C 6.4 09/28/2015    Nm Pet Image Restag (ps) Skull Base To Thigh  04/29/2015  CLINICAL DATA:  Subsequent treatment strategy for history of follicular lymphoma. Restaging. EXAM: NUCLEAR MEDICINE PET SKULL BASE TO THIGH TECHNIQUE: 15.8 mCi F-18 FDG was injected intravenously. Full-ring PET imaging was performed from the skull base to thigh after the radiotracer. CT data was obtained and used for attenuation correction and anatomic localization. FASTING BLOOD GLUCOSE:  Value: 105 mg/dl COMPARISON:  12/25/2014 FINDINGS: NECK Resolution of previously described left supraclavicular nodal mass and hypermetabolism. Minimal non hypermetabolic soft tissue thickening remains in this area, measuring on the order of 11 mm. CHEST No residual thoracic nodal hypermetabolism identified. Note is made of hypermetabolism corresponding to a right lower lobe pulmonary artery branch. This measures a S.U.V. max of 3.3, including on approximately image 100. No pulmonary nodule or nodal correlate identified. ABDOMEN/PELVIS Resolution of previously described retroperitoneal abdominal nodal hypermetabolism. A right inguinal node measures 8 mm and a S.U.V. max of 3.5. Nodes in this region on the prior measured up to 13 mm and a  S.U.V. max of 4.2 (when remeasured). Right external iliac node measures 11 mm a S.U.V. max of 3.8 on image 188. Compare 13 mm and a S.U.V. of 2.5 on the prior exam. SKELETON A focus of skin thickening about the low anterior right pelvis corresponds to hypermetabolism. This measures a S.U.V. max of 2.8, including on image 211. This is likely a new since the prior exam.Relatively diffuse hyper metabolism throughout the marrow. No focal lesion identified. CT IMAGES PERFORMED FOR ATTENUATION CORRECTION Right maxillary sinus fluid level. Subcutaneous lesion about the right posterior neck is likely a sebaceous cyst at 2.5 cm. Cardiomegaly. Prior median sternotomy. Pulmonary artery enlargement at 3.7 cm. Bilateral pleural plaques, with calcification. hepatomegaly. Cholelithiasis. Bilateral nephrolithiasis. Decreased left periaortic abdominal nodes size at 1.0 cm today versus 1.6 cm on the prior. Degenerative partial fusion of the bilateral sacroiliac joints. IMPRESSION: 1. Response to therapy. Decrease size and hypermetabolism of nodes. Residual disease suspected, including within the right hemipelvis. 2. Marrow hypermetabolism is diffuse and favored to be due to stimulation by chemotherapy. 3. Skin thickening and  hypermetabolism about the low right pelvis is indeterminate and warrants physical exam correlation. 4. Bilateral pleural plaques with calcification, consistent with asbestos related pleural disease. 5. Cardiomegaly with prior median sternotomy. Pulmonary artery enlargement suggests pulmonary arterial hypertension. 6. Cholelithiasis and nephrolithiasis. 7. Mild hypermetabolism along the course of a right pulmonary artery branch to the lower lobe. No pulmonary parenchymal or nodal correlate in this area. This is indeterminate and could be physiologic or represent small volume embolism after radiopharmaceutical injection. 8. Sinus disease Electronically Signed   By: Abigail Miyamoto M.D.   On: 04/29/2015 17:24     Assessment & Plan:   Diagnoses and all orders for this visit:  Chronic diastolic CHF (congestive heart failure) ([facility])  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 ([place])  Controlled type 2 diabetes mellitus with diabetic peripheral angiopathy without gangrene, without long-term current use of insulin ([facility]) -     Basic metabolic panel; Future -     Hemoglobin A1c; Future  Morbid obesity, unspecified obesity type ([place])  LBP radiating to both legs  Other orders -     albuterol (PROVENTIL) (2.5 MG/3ML) 0.083% nebulizer solution; Take 3 mLs (2.5 mg total) by nebulization every 2 (two) hours as needed for wheezing. -     oxyCODONE-acetaminophen (PERCOCET/ROXICET) 5-325 MG tablet; Take 1 tablet by mouth every 6 (six) hours as needed for severe pain.  I am having Mr. Kneeland maintain his aspirin EC, nitroGLYCERIN, metoprolol succinate, torsemide, simvastatin, clopidogrel, ANORO ELLIPTA, clonazePAM, KLOR-CON M20, albuterol, and oxyCODONE-acetaminophen.  Meds ordered this encounter  Medications  . albuterol (PROVENTIL) (2.5 MG/3ML) 0.083% nebulizer solution    Sig: Take 3 mLs (2.5 mg total) by nebulization every 2 (two) hours as needed for wheezing.    Dispense:  75 mL    Refill:  2  . oxyCODONE-acetaminophen (PERCOCET/ROXICET) 5-325 MG tablet    Sig: Take 1 tablet by mouth every 6 (six) hours as needed for severe pain.    Dispense:  120 tablet    Refill:  0     Follow-up: Return in about 3 months (around 12/26/2015) for a follow-up visit.  Walker Kehr, MD

## 2015-09-28 NOTE — Assessment & Plan Note (Signed)
On diet Wt Readings from Last 3 Encounters:  09/28/15 346 lb (156.945 kg)  08/20/15 353 lb 1.6 oz (160.165 kg)  06/30/15 350 lb (158.759 kg)

## 2015-09-28 NOTE — Assessment & Plan Note (Signed)
  On diet  

## 2015-09-28 NOTE — Assessment & Plan Note (Signed)
Better Wounds are healed

## 2015-09-28 NOTE — Telephone Encounter (Signed)
Pt called stating he has not heard from scheduler about his PET scan. Dr Alvy Bimler ordered on 2/6. This Rn called xray scheduling and Phalicia said she would look into it and give pt a call.

## 2015-09-28 NOTE — Assessment & Plan Note (Addendum)
Edema is better - wounds are healed On Torsemide, Hydralazine NAS diet

## 2015-09-28 NOTE — Assessment & Plan Note (Signed)
Chronic  Percocet prn  Potential benefits of a long term opioids use as well as potential risks (i.e. addiction risk, apnea etc) and complications (i.e. Somnolence, constipation and others) were explained to the patient and were aknowledged. 

## 2015-09-28 NOTE — Progress Notes (Signed)
Pre visit review using our clinic review tool, if applicable. No additional management support is needed unless otherwise documented below in the visit note. 

## 2015-10-08 ENCOUNTER — Encounter (HOSPITAL_COMMUNITY)
Admission: RE | Admit: 2015-10-08 | Discharge: 2015-10-08 | Disposition: A | Discharge status: Home / Self Care | Payer: BLUE CROSS/BLUE SHIELD | Source: Ambulatory Visit | Attending: Hematology and Oncology | Admitting: Hematology and Oncology

## 2015-10-08 DIAGNOSIS — C8238 Follicular lymphoma grade IIIa, lymph nodes of multiple sites: Secondary | ICD-10-CM

## 2015-10-11 ENCOUNTER — Encounter (HOSPITAL_COMMUNITY)
Admission: RE | Admit: 2015-10-11 | Discharge: 2015-10-11 | Disposition: A | Discharge status: Home / Self Care | Payer: BLUE CROSS/BLUE SHIELD | Source: Ambulatory Visit | Attending: Hematology and Oncology | Admitting: Hematology and Oncology

## 2015-10-11 DIAGNOSIS — C8238 Follicular lymphoma grade IIIa, lymph nodes of multiple sites: Secondary | ICD-10-CM | POA: Diagnosis present

## 2015-10-11 LAB — GLUCOSE, CAPILLARY: GLUCOSE-CAPILLARY: 101 mg/dL — AB (ref 65–99)

## 2015-10-11 MED ORDER — FLUDEOXYGLUCOSE F - 18 (FDG) INJECTION
16.5600 | Freq: Once | INTRAVENOUS | Status: AC | PRN
  Administered 2015-10-11: 16.56 via INTRAVENOUS

## 2015-10-15 ENCOUNTER — Encounter: Payer: Self-pay | Admitting: Hematology and Oncology

## 2015-10-15 ENCOUNTER — Ambulatory Visit (HOSPITAL_BASED_OUTPATIENT_CLINIC_OR_DEPARTMENT_OTHER): Payer: BLUE CROSS/BLUE SHIELD

## 2015-10-15 ENCOUNTER — Telehealth: Payer: Self-pay | Admitting: Hematology and Oncology

## 2015-10-15 ENCOUNTER — Ambulatory Visit (HOSPITAL_BASED_OUTPATIENT_CLINIC_OR_DEPARTMENT_OTHER): Payer: BLUE CROSS/BLUE SHIELD | Admitting: Hematology and Oncology

## 2015-10-15 ENCOUNTER — Ambulatory Visit: Payer: BLUE CROSS/BLUE SHIELD

## 2015-10-15 ENCOUNTER — Other Ambulatory Visit (HOSPITAL_BASED_OUTPATIENT_CLINIC_OR_DEPARTMENT_OTHER): Payer: BLUE CROSS/BLUE SHIELD

## 2015-10-15 VITALS — BP 153/56 | HR 76 | Temp 98.6°F | Resp 20 | Wt 355.2 lb

## 2015-10-15 VITALS — BP 105/38 | HR 62 | Temp 97.0°F | Resp 18

## 2015-10-15 DIAGNOSIS — Z720 Tobacco use: Secondary | ICD-10-CM | POA: Diagnosis not present

## 2015-10-15 DIAGNOSIS — C8238 Follicular lymphoma grade IIIa, lymph nodes of multiple sites: Secondary | ICD-10-CM

## 2015-10-15 DIAGNOSIS — I5032 Chronic diastolic (congestive) heart failure: Secondary | ICD-10-CM | POA: Diagnosis not present

## 2015-10-15 DIAGNOSIS — Z5112 Encounter for antineoplastic immunotherapy: Secondary | ICD-10-CM | POA: Diagnosis not present

## 2015-10-15 DIAGNOSIS — J449 Chronic obstructive pulmonary disease, unspecified: Secondary | ICD-10-CM

## 2015-10-15 DIAGNOSIS — Z95828 Presence of other vascular implants and grafts: Secondary | ICD-10-CM

## 2015-10-15 LAB — CBC WITH DIFFERENTIAL/PLATELET
BASO%: 0.5 % (ref 0.0–2.0)
Basophils Absolute: 0 10*3/uL (ref 0.0–0.1)
EOS%: 4.2 % (ref 0.0–7.0)
Eosinophils Absolute: 0.4 10*3/uL (ref 0.0–0.5)
HEMATOCRIT: 44.7 % (ref 38.4–49.9)
HEMOGLOBIN: 15 g/dL (ref 13.0–17.1)
LYMPH#: 0.9 10*3/uL (ref 0.9–3.3)
LYMPH%: 9.8 % — ABNORMAL LOW (ref 14.0–49.0)
MCH: 32.8 pg (ref 27.2–33.4)
MCHC: 33.6 g/dL (ref 32.0–36.0)
MCV: 97.8 fL (ref 79.3–98.0)
MONO#: 1.2 10*3/uL — ABNORMAL HIGH (ref 0.1–0.9)
MONO%: 13.1 % (ref 0.0–14.0)
NEUT#: 6.4 10*3/uL (ref 1.5–6.5)
NEUT%: 72.4 % (ref 39.0–75.0)
Platelets: 141 10*3/uL (ref 140–400)
RBC: 4.57 10*6/uL (ref 4.20–5.82)
RDW: 14 % (ref 11.0–14.6)
WBC: 8.8 10*3/uL (ref 4.0–10.3)

## 2015-10-15 LAB — COMPREHENSIVE METABOLIC PANEL
ALK PHOS: 90 U/L (ref 40–150)
ALT: 27 U/L (ref 0–55)
AST: 21 U/L (ref 5–34)
Albumin: 4 g/dL (ref 3.5–5.0)
Anion Gap: 11 mEq/L (ref 3–11)
BILIRUBIN TOTAL: 0.56 mg/dL (ref 0.20–1.20)
BUN: 12.2 mg/dL (ref 7.0–26.0)
CALCIUM: 9.2 mg/dL (ref 8.4–10.4)
CO2: 29 meq/L (ref 22–29)
Chloride: 98 mEq/L (ref 98–109)
Creatinine: 0.8 mg/dL (ref 0.7–1.3)
EGFR: >90 mL/min/{1.73_m2} (ref >90)
Glucose: 103 mg/dl (ref 70–140)
POTASSIUM: 4.2 meq/L (ref 3.5–5.1)
SODIUM: 138 meq/L (ref 136–145)
Total Protein: 7.1 g/dL (ref 6.4–8.3)

## 2015-10-15 LAB — LACTATE DEHYDROGENASE: LDH: 145 U/L (ref 125–245)

## 2015-10-15 MED ORDER — SODIUM CHLORIDE 0.9% FLUSH
10.0000 mL | INTRAVENOUS | Status: DC | PRN
  Administered 2015-10-15: 10 mL via INTRAVENOUS
  Filled 2015-10-15: qty 10

## 2015-10-15 MED ORDER — DIPHENHYDRAMINE HCL 25 MG PO CAPS
50.0000 mg | ORAL_CAPSULE | Freq: Once | ORAL | Status: AC
  Administered 2015-10-15: 50 mg via ORAL

## 2015-10-15 MED ORDER — ACETAMINOPHEN 325 MG PO TABS
650.0000 mg | ORAL_TABLET | Freq: Once | ORAL | Status: AC
  Administered 2015-10-15: 650 mg via ORAL

## 2015-10-15 MED ORDER — ANTICOAGULANT SODIUM CITRATE 4% (200MG/5ML) IV SOLN
5.0000 mL | Freq: Once | Status: AC | PRN
  Filled 2015-10-15: qty 5

## 2015-10-15 MED ORDER — SODIUM CHLORIDE 0.9 % IJ SOLN
10.0000 mL | INTRAMUSCULAR | Status: DC | PRN
Start: 2015-10-15 — End: 2015-10-15
  Administered 2015-10-15: 10 mL
  Filled 2015-10-15: qty 10

## 2015-10-15 MED ORDER — ANTICOAGULANT SODIUM CITRATE 4% (200MG/5ML) IV SOLN
5.0000 mL | Freq: Once | Status: AC
  Administered 2015-10-15: 5 mL via INTRAVENOUS
  Filled 2015-10-15: qty 5

## 2015-10-15 MED ORDER — SODIUM CHLORIDE 0.9 % IV SOLN
375.0000 mg/m2 | Freq: Once | INTRAVENOUS | Status: AC
  Administered 2015-10-15: 1100 mg via INTRAVENOUS
  Filled 2015-10-15: qty 100

## 2015-10-15 MED ORDER — SODIUM CHLORIDE 0.9 % IV SOLN
Freq: Once | INTRAVENOUS | Status: AC
  Administered 2015-10-15: 10:00:00 via INTRAVENOUS

## 2015-10-15 MED ORDER — ACETAMINOPHEN 325 MG PO TABS
ORAL_TABLET | ORAL | Status: AC
  Filled 2015-10-15: qty 2

## 2015-10-15 MED ORDER — DIPHENHYDRAMINE HCL 25 MG PO CAPS
ORAL_CAPSULE | ORAL | Status: AC
  Filled 2015-10-15: qty 2

## 2015-10-15 NOTE — Assessment & Plan Note (Signed)
His last PET scan showed omplete response to treatment. He has poor tolerance to chemotherapy with recurrent infection. He is doing well on maintenance rituximab only and he agreed with the plan. I will see him every 8 weeks with history, blood work, physical examination and rituximab. I plan to repeat imaging study in 1 year

## 2015-10-15 NOTE — Assessment & Plan Note (Signed)
The patient has no evidence of CHF exacerbation Continue medical management I recommend him to quit smoking

## 2015-10-15 NOTE — Assessment & Plan Note (Signed)
The patient have chronic hypoxemia and wears oxygen intermittently. He follows closely with pulmonologist. His respiratory status improved and he will continue medical management I recommend him to quit smoking

## 2015-10-15 NOTE — Telephone Encounter (Signed)
Gave and printed appt sched and avs for pt for March thru May °

## 2015-10-15 NOTE — Progress Notes (Signed)
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 [facility])   06/24/2009 Imaging PET CT scan showed two small FDG positive left neck nodes   12/11/2014 Surgery He underwent excisional lymph node biopsy of the left supraclavicular lymph node/neck region   12/11/2014 Pathology Results Accession: [id number] biopsy show follicular lymphoma   [id number] Imaging ECHO showed LVH but preserved EF   12/25/2014 Imaging PET scan showed disease above and below diaphragm   12/28/2014 Procedure He has port placement   12/31/2014 - 01/01/2015 Chemotherapy He received 1 cycle of bendamustine with rituximab, discontinued due to suspicion of allergic reaction to bendamustine   01/12/2015 - 01/19/2015 Hospital Admission He was admitted to the hospital with suspicious allergic reaction, significant bilateral lower extremity edema with cellulitis, pneumonia and mild fluid overload   03/18/2015 - 04/08/2015 Chemotherapy Treatment is switched to rituximab, Cytoxan, vincristine and prednisone   04/29/2015 Imaging  PET CT scan showed near complete response to treatment.   04/30/2015 -  Chemotherapy He received maintenance Rituxan    INTERVAL HISTORY: Please see below for problem oriented charting. He returns to review scan result and prior to Rituxan Rx He denies recent infection. No further cellulitis. He is still smoking Tolerated rituxan well  REVIEW OF SYSTEMS:   Constitutional: Denies fevers, chills or abnormal weight loss Eyes:  Denies blurriness of vision Ears, nose, mouth, throat, and face: Denies mucositis or sore throat Respiratory: Denies cough, dyspnea or wheezes Cardiovascular: Denies palpitation, chest discomfort Gastrointestinal:  Denies nausea, heartburn or change in bowel habits Skin: Denies abnormal skin rashes Lymphatics: Denies new lymphadenopathy or easy bruising Neurological:Denies numbness, tingling or new weaknesses Behavioral/Psych: Mood is stable, no new changes  All other systems were reviewed with the patient and are negative.  I have reviewed the past medical history, past surgical history, social history and family history with the patient and they are unchanged from previous note.  ALLERGIES:  is allergic to bendamustine hcl; heparin; and tape.  MEDICATIONS:  Current Outpatient Prescriptions  Medication Sig Dispense Refill  . albuterol (PROVENTIL) (2.5 MG/3ML) 0.083% nebulizer solution Take 3 mLs (2.5 mg total) by nebulization every 2 (two) hours as needed for wheezing. 75 mL 2  . ANORO ELLIPTA 62.5-25 MCG/INH AEPB Inhale 1 puff into the lungs daily as needed. For wheezing & SOB 60 each 5  . aspirin EC 81 MG tablet Take 81 mg by mouth daily.    . clonazePAM (KLONOPIN) 0.5 MG tablet TAKE 1 TABLET TWICE A DAY AS NEEDED FOR ANXIETY OR AT BEDTME FOR INSOMNIA 180 tablet 1  . clopidogrel (PLAVIX) 75 MG tablet TAKE 1 TABLET DAILY WITH   BREAKFAST 90 tablet 3  . KLOR-CON M20 20 MEQ tablet TAKE 1 TABLET DAILY 90 tablet 1  . metoprolol succinate (TOPROL-XL) 50 MG 24 hr tablet Take 100 mg by mouth daily.    . nitroGLYCERIN (NITROSTAT) 0.4 MG SL tablet Place 1 tablet (0.4 mg total) under the tongue every 5 (five) minutes as needed for chest pain (Up to 3 doses). 25 tablet 4  .  oxyCODONE-acetaminophen (PERCOCET/ROXICET) 5-325 MG tablet Take 1 tablet by mouth every 6 (six) hours as needed for severe pain. 120 tablet 0  . simvastatin (ZOCOR) 20 MG tablet TAKE 1 TABLET AT BEDTIME 90 tablet 3  . torsemide  (DEMADEX) 100 MG tablet Take 1 tablet (100 mg total) by mouth daily. 30 tablet 3   No current facility-administered medications for this visit.   Facility-Administered Medications Ordered in Other Visits  Medication Dose Route Frequency Provider Last Rate Last Dose  . anticoagulant sodium citrate solution 5 mL  5 mL Intravenous Once PRN Heath Lark, MD      . anticoagulant sodium citrate solution 5 mL  5 mL Intravenous Once Heath Lark, MD      . sodium chloride 0.9 % injection 10 mL  10 mL Intracatheter PRN Heath Lark, MD        PHYSICAL EXAMINATION: ECOG PERFORMANCE STATUS: 1 - Symptomatic but completely ambulatory  Filed Vitals:   10/15/15 0831  BP: 153/56  Pulse: 76  Temp: 98.6 F (37 C)  Resp: 20   Filed Weights   10/15/15 0831  Weight: 355 lb 3.2 oz (161.118 kg)    GENERAL:alert, no distress and comfortable. He is morbidly obese SKIN: skin color, texture, turgor are normal, no rashes or significant lesions EYES: normal, Conjunctiva are pink and non-injected, sclera clear OROPHARYNX:no exudate, no erythema and lips, buccal mucosa, and tongue normal  NECK: supple, thyroid normal size, non-tender, without nodularity LYMPH:  no palpable lymphadenopathy in the cervical, axillary or inguinal LUNGS: clear to auscultation and percussion with normal breathing effort HEART: regular rate & rhythm and no murmurs  With moderate bilateral lower extremity edema ABDOMEN:abdomen soft, non-tender and normal bowel sounds Musculoskeletal:no cyanosis of digits and no clubbing  NEURO: alert & oriented x 3 with fluent speech, no focal motor/sensory deficits  LABORATORY DATA:  I have reviewed the data as listed    Component Value Date/Time   NA 138 10/15/2015 0816   NA 139 09/28/2015 1623   K 4.2 10/15/2015 0816   K 4.4 09/28/2015 1623   CL 97 09/28/2015 1623   CO2 29 10/15/2015 0816   CO2 37* 09/28/2015 1623   GLUCOSE 103 10/15/2015 0816   GLUCOSE 107* 09/28/2015 1623   BUN 12.2  10/15/2015 0816   BUN 13 09/28/2015 1623   CREATININE 0.8 10/15/2015 0816   CREATININE 0.87 09/28/2015 1623   CALCIUM 9.2 10/15/2015 0816   CALCIUM 9.7 09/28/2015 1623   PROT 7.1 10/15/2015 0816   PROT 6.6 01/15/2015 0500   ALBUMIN 4.0 10/15/2015 0816   ALBUMIN 3.1* 01/15/2015 0500   AST 21 10/15/2015 0816   AST 22 01/15/2015 0500   ALT 27 10/15/2015 0816   ALT 27 01/15/2015 0500   ALKPHOS 90 10/15/2015 0816   ALKPHOS 57 01/15/2015 0500   BILITOT 0.56 10/15/2015 0816   BILITOT 0.6 01/15/2015 0500   GFRNONAA >60 01/18/2015 0320   GFRAA >60 01/18/2015 0320    No results found for: SPEP, UPEP  Lab Results  Component Value Date   WBC 8.8 10/15/2015   NEUTROABS 6.4 10/15/2015   HGB 15.0 10/15/2015   HCT 44.7 10/15/2015   MCV 97.8 10/15/2015   PLT 141 10/15/2015      Chemistry      Component Value Date/Time   NA 138 10/15/2015 0816   NA 139 09/28/2015 1623   K 4.2 10/15/2015 0816   K 4.4 09/28/2015 1623   CL 97 09/28/2015 1623   CO2  29 10/15/2015 0816   CO2 37* 09/28/2015 1623   BUN 12.2 10/15/2015 0816   BUN 13 09/28/2015 1623   CREATININE 0.8 10/15/2015 0816   CREATININE 0.87 09/28/2015 1623      Component Value Date/Time   CALCIUM 9.2 10/15/2015 0816   CALCIUM 9.7 09/28/2015 1623   ALKPHOS 90 10/15/2015 0816   ALKPHOS 57 01/15/2015 0500   AST 21 10/15/2015 0816   AST 22 01/15/2015 0500   ALT 27 10/15/2015 0816   ALT 27 01/15/2015 0500   BILITOT 0.56 10/15/2015 0816   BILITOT 0.6 01/15/2015 0500       RADIOGRAPHIC STUDIES:I reviewed recent PET scan with him and wife I have personally reviewed the radiological images as listed and agreed with the findings in the report.    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 He has poor tolerance to chemotherapy with recurrent infection. He is doing well on maintenance rituximab only and he agreed with the  plan. I will see him every 8 weeks with history, blood work, physical examination and rituximab. I plan to repeat imaging study in 1 year  Tobacco abuse I spent some time counseling the patient the importance of tobacco cessation. He is currently not interested to quit now.   Chronic diastolic CHF (congestive heart failure) (HCC) The patient has no evidence of CHF exacerbation Continue medical management I recommend him to quit smoking   COPD mixed type The patient have chronic hypoxemia and wears oxygen intermittently. He follows closely with pulmonologist. His respiratory status improved and he will continue medical management I recommend him to quit smoking    Orders Placed This Encounter  Procedures  . CBC with Differential/Platelet    Standing Status: Standing     Number of Occurrences: 9     Standing Expiration Date: 10/14/2016  . Comprehensive metabolic panel    Standing Status: Standing     Number of Occurrences: 9     Standing Expiration Date: 10/14/2016   All questions were answered. The patient knows to call the clinic with any problems, questions or concerns. No barriers to learning was detected. I spent 25 minutes counseling the patient face to face. The total time spent in the appointment was 30 minutes and more than 50% was on counseling and review of test results     [facility], [place], MD 10/15/2015 1:18 PM

## 2015-10-15 NOTE — Assessment & Plan Note (Signed)
I spent some time counseling the patient the importance of tobacco cessation. He is currently not interested to quit now.  

## 2015-10-15 NOTE — Patient Instructions (Signed)
Rituximab injection What is this medicine? RITUXIMAB (ri TUX i mab) is a monoclonal antibody. It is used commonly to treat non-Hodgkin lymphoma and other conditions. It is also used to treat rheumatoid arthritis (RA). In RA, this medicine slows the inflammatory process and help reduce joint pain and swelling. This medicine is often used with other cancer or arthritis medications. This medicine may be used for other purposes; ask your health care provider or pharmacist if you have questions. What should I tell my health care provider before I take this medicine? They need to know if you have any of these conditions: -blood disorders -heart disease -history of hepatitis B -infection (especially a virus infection such as chickenpox, cold sores, or herpes) -irregular heartbeat -kidney disease -lung or breathing disease, like asthma -lupus -an unusual or allergic reaction to rituximab, mouse proteins, other medicines, foods, dyes, or preservatives -pregnant or trying to get pregnant -breast-feeding How should I use this medicine? This medicine is for infusion into a vein. It is administered in a hospital or clinic by a specially trained health care professional. A special MedGuide will be given to you by the pharmacist with each prescription and refill. Be sure to read this information carefully each time. Talk to your pediatrician regarding the use of this medicine in children. This medicine is not approved for use in children. Overdosage: If you think you have taken too much of this medicine contact a poison control center or emergency room at once. NOTE: This medicine is only for you. Do not share this medicine with others. What if I miss a dose? It is important not to miss a dose. Call your doctor or health care professional if you are unable to keep an appointment. What may interact with this medicine? -cisplatin -medicines for blood pressure -some other medicines for  arthritis -vaccines This list may not describe all possible interactions. Give your health care provider a list of all the medicines, herbs, non-prescription drugs, or dietary supplements you use. Also tell them if you smoke, drink alcohol, or use illegal drugs. Some items may interact with your medicine. What should I watch for while using this medicine? Report any side effects that you notice during your treatment right away, such as changes in your breathing, fever, chills, dizziness or lightheadedness. These effects are more common with the first dose. Visit your prescriber or health care professional for checks on your progress. You will need to have regular blood work. Report any other side effects. The side effects of this medicine can continue after you finish your treatment. Continue your course of treatment even though you feel ill unless your doctor tells you to stop. Call your doctor or health care professional for advice if you get a fever, chills or sore throat, or other symptoms of a cold or flu. Do not treat yourself. This drug decreases your body's ability to fight infections. Try to avoid being around people who are sick. This medicine may increase your risk to bruise or bleed. Call your doctor or health care professional if you notice any unusual bleeding. Be careful brushing and flossing your teeth or using a toothpick because you may get an infection or bleed more easily. If you have any dental work done, tell your dentist you are receiving this medicine. Avoid taking products that contain aspirin, acetaminophen, ibuprofen, naproxen, or ketoprofen unless instructed by your doctor. These medicines may hide a fever. Do not become pregnant while taking this medicine. Women should inform their doctor if   they wish to become pregnant or think they might be pregnant. There is a potential for serious side effects to an unborn child. Talk to your health care professional or pharmacist for more  information. Do not breast-feed an infant while taking this medicine. What side effects may I notice from receiving this medicine? Side effects that you should report to your doctor or health care professional as soon as possible: -allergic reactions like skin rash, itching or hives, swelling of the face, lips, or tongue -low blood counts - this medicine may decrease the number of white blood cells, red blood cells and platelets. You may be at increased risk for infections and bleeding. -signs of infection - fever or chills, cough, sore throat, pain or difficulty passing urine -signs of decreased platelets or bleeding - bruising, pinpoint red spots on the skin, black, tarry stools, blood in the urine -signs of decreased red blood cells - unusually weak or tired, fainting spells, lightheadedness -breathing problems -confused, not responsive -chest pain -fast, irregular heartbeat -feeling faint or lightheaded, falls -mouth sores -redness, blistering, peeling or loosening of the skin, including inside the mouth -stomach pain -swelling of the ankles, feet, or hands -trouble passing urine or change in the amount of urine Side effects that usually do not require medical attention (report to your doctor or other health care professional if they continue or are bothersome): -anxiety -headache -loss of appetite -muscle aches -nausea -night sweats This list may not describe all possible side effects. Call your doctor for medical advice about side effects. You may report side effects to FDA at 1-800-FDA-1088. Where should I keep my medicine? This drug is given in a hospital or clinic and will not be stored at home. NOTE: This sheet is a summary. It may not cover all possible information. If you have questions about this medicine, talk to your doctor, pharmacist, or health care provider.    2016, [employer]. (2014-10-07 22:30:56)  

## 2015-10-15 NOTE — Patient Instructions (Signed)

## 2015-10-18 ENCOUNTER — Other Ambulatory Visit: Payer: Self-pay | Admitting: Hematology and Oncology

## 2015-10-21 ENCOUNTER — Other Ambulatory Visit: Payer: Self-pay | Admitting: Internal Medicine

## 2015-11-05 ENCOUNTER — Ambulatory Visit (HOSPITAL_COMMUNITY)
Admission: RE | Admit: 2015-11-05 | Discharge: 2015-11-05 | Disposition: A | Discharge status: Home / Self Care | Payer: BLUE CROSS/BLUE SHIELD | Source: Ambulatory Visit | Attending: Cardiology | Admitting: Cardiology

## 2015-11-05 DIAGNOSIS — J449 Chronic obstructive pulmonary disease, unspecified: Secondary | ICD-10-CM | POA: Diagnosis not present

## 2015-11-05 DIAGNOSIS — K219 Gastro-esophageal reflux disease without esophagitis: Secondary | ICD-10-CM | POA: Diagnosis not present

## 2015-11-05 DIAGNOSIS — E119 Type 2 diabetes mellitus without complications: Secondary | ICD-10-CM | POA: Insufficient documentation

## 2015-11-05 DIAGNOSIS — I739 Peripheral vascular disease, unspecified: Secondary | ICD-10-CM

## 2015-11-05 DIAGNOSIS — F329 Major depressive disorder, single episode, unspecified: Secondary | ICD-10-CM | POA: Diagnosis not present

## 2015-11-05 DIAGNOSIS — I110 Hypertensive heart disease with heart failure: Secondary | ICD-10-CM | POA: Diagnosis not present

## 2015-11-05 DIAGNOSIS — E785 Hyperlipidemia, unspecified: Secondary | ICD-10-CM | POA: Diagnosis not present

## 2015-11-05 DIAGNOSIS — I252 Old myocardial infarction: Secondary | ICD-10-CM | POA: Diagnosis not present

## 2015-11-05 DIAGNOSIS — F419 Anxiety disorder, unspecified: Secondary | ICD-10-CM | POA: Insufficient documentation

## 2015-11-05 DIAGNOSIS — I6523 Occlusion and stenosis of bilateral carotid arteries: Secondary | ICD-10-CM | POA: Insufficient documentation

## 2015-11-05 DIAGNOSIS — I509 Heart failure, unspecified: Secondary | ICD-10-CM | POA: Diagnosis not present

## 2015-11-05 DIAGNOSIS — I779 Disorder of arteries and arterioles, unspecified: Secondary | ICD-10-CM

## 2015-11-12 ENCOUNTER — Ambulatory Visit: Payer: BLUE CROSS/BLUE SHIELD | Admitting: Cardiovascular Disease

## 2015-11-14 DIAGNOSIS — J449 Chronic obstructive pulmonary disease, unspecified: Secondary | ICD-10-CM | POA: Diagnosis not present

## 2015-11-17 ENCOUNTER — Other Ambulatory Visit: Payer: BLUE CROSS/BLUE SHIELD

## 2015-11-17 ENCOUNTER — Ambulatory Visit: Payer: BLUE CROSS/BLUE SHIELD

## 2015-11-17 ENCOUNTER — Ambulatory Visit: Payer: BLUE CROSS/BLUE SHIELD | Admitting: Hematology and Oncology

## 2015-11-24 ENCOUNTER — Telehealth: Payer: Self-pay | Admitting: Internal Medicine

## 2015-11-24 MED ORDER — METOPROLOL SUCCINATE ER 50 MG PO TB24: 100.0000 mg | ORAL_TABLET | Freq: Every day | ORAL | Status: DC

## 2015-11-24 NOTE — Telephone Encounter (Signed)
Pt called request refill for metoprolol succinate to be send to [facility]. Please advise, insurance will pay for if we send to mail order for 3 months supply.

## 2015-11-24 NOTE — Telephone Encounter (Signed)
Rx sent to [facility]...[name]

## 2015-12-08 ENCOUNTER — Telehealth: Payer: Self-pay | Admitting: *Deleted

## 2015-12-08 NOTE — Telephone Encounter (Signed)
Requesting refill on his pain medication "Oxycodone"...[name]

## 2015-12-08 NOTE — Telephone Encounter (Signed)
OK to fill this prescription with additional refills x0 Thank you!  

## 2015-12-09 MED ORDER — OXYCODONE-ACETAMINOPHEN 5-325 MG PO TABS: 1.0000 | ORAL_TABLET | Freq: Four times a day (QID) | ORAL | Status: DC | PRN

## 2015-12-09 NOTE — Telephone Encounter (Signed)
Called pt no answer LMOm rx ready for pick-up.../lmb 

## 2015-12-16 ENCOUNTER — Other Ambulatory Visit: Payer: Self-pay

## 2015-12-16 ENCOUNTER — Other Ambulatory Visit: Payer: Self-pay | Admitting: *Deleted

## 2015-12-16 MED ORDER — TORSEMIDE 100 MG PO TABS: 100.0000 mg | ORAL_TABLET | Freq: Every day | ORAL | Status: DC

## 2015-12-17 ENCOUNTER — Other Ambulatory Visit: Payer: Self-pay | Admitting: Hematology and Oncology

## 2015-12-17 ENCOUNTER — Ambulatory Visit: Payer: BLUE CROSS/BLUE SHIELD

## 2015-12-17 ENCOUNTER — Other Ambulatory Visit (HOSPITAL_BASED_OUTPATIENT_CLINIC_OR_DEPARTMENT_OTHER): Payer: BLUE CROSS/BLUE SHIELD

## 2015-12-17 ENCOUNTER — Ambulatory Visit (HOSPITAL_BASED_OUTPATIENT_CLINIC_OR_DEPARTMENT_OTHER): Payer: BLUE CROSS/BLUE SHIELD | Admitting: Hematology and Oncology

## 2015-12-17 ENCOUNTER — Encounter: Payer: Self-pay | Admitting: Hematology and Oncology

## 2015-12-17 ENCOUNTER — Ambulatory Visit (HOSPITAL_BASED_OUTPATIENT_CLINIC_OR_DEPARTMENT_OTHER): Payer: BLUE CROSS/BLUE SHIELD

## 2015-12-17 ENCOUNTER — Telehealth: Payer: Self-pay | Admitting: Hematology and Oncology

## 2015-12-17 VITALS — BP 141/86 | HR 72 | Temp 99.0°F | Resp 20 | Wt 361.8 lb

## 2015-12-17 VITALS — BP 147/61 | HR 59 | Temp 98.5°F | Resp 20

## 2015-12-17 DIAGNOSIS — C8238 Follicular lymphoma grade IIIa, lymph nodes of multiple sites: Secondary | ICD-10-CM

## 2015-12-17 DIAGNOSIS — J449 Chronic obstructive pulmonary disease, unspecified: Secondary | ICD-10-CM

## 2015-12-17 DIAGNOSIS — I5032 Chronic diastolic (congestive) heart failure: Secondary | ICD-10-CM | POA: Diagnosis not present

## 2015-12-17 DIAGNOSIS — Z720 Tobacco use: Secondary | ICD-10-CM

## 2015-12-17 DIAGNOSIS — Z5112 Encounter for antineoplastic immunotherapy: Secondary | ICD-10-CM | POA: Diagnosis not present

## 2015-12-17 DIAGNOSIS — D696 Thrombocytopenia, unspecified: Secondary | ICD-10-CM | POA: Diagnosis not present

## 2015-12-17 LAB — CBC WITH DIFFERENTIAL/PLATELET
BASO%: 1.1 % (ref 0.0–2.0)
Basophils Absolute: 0.1 10*3/uL (ref 0.0–0.1)
EOS%: 3.2 % (ref 0.0–7.0)
Eosinophils Absolute: 0.3 10*3/uL (ref 0.0–0.5)
HEMATOCRIT: 44.9 % (ref 38.4–49.9)
HGB: 14.5 g/dL (ref 13.0–17.1)
LYMPH#: 0.7 10*3/uL — AB (ref 0.9–3.3)
LYMPH%: 7.5 % — ABNORMAL LOW (ref 14.0–49.0)
MCH: 32.3 pg (ref 27.2–33.4)
MCHC: 32.4 g/dL (ref 32.0–36.0)
MCV: 99.7 fL — ABNORMAL HIGH (ref 79.3–98.0)
MONO#: 0.8 10*3/uL (ref 0.1–0.9)
MONO%: 8.2 % (ref 0.0–14.0)
NEUT%: 80 % — ABNORMAL HIGH (ref 39.0–75.0)
NEUTROS ABS: 7.8 10*3/uL — AB (ref 1.5–6.5)
PLATELETS: 122 10*3/uL — AB (ref 140–400)
RBC: 4.5 10*6/uL (ref 4.20–5.82)
RDW: 13.4 % (ref 11.0–14.6)
WBC: 9.7 10*3/uL (ref 4.0–10.3)

## 2015-12-17 LAB — COMPREHENSIVE METABOLIC PANEL
ALT: 30 U/L (ref 0–55)
ANION GAP: 8 meq/L (ref 3–11)
AST: 19 U/L (ref 5–34)
Albumin: 3.8 g/dL (ref 3.5–5.0)
Alkaline Phosphatase: 91 U/L (ref 40–150)
BUN: 11.4 mg/dL (ref 7.0–26.0)
CALCIUM: 9 mg/dL (ref 8.4–10.4)
CHLORIDE: 104 meq/L (ref 98–109)
CO2: 28 meq/L (ref 22–29)
CREATININE: 0.8 mg/dL (ref 0.7–1.3)
EGFR: >90 mL/min/{1.73_m2} (ref >90)
Glucose: 134 mg/dl (ref 70–140)
Potassium: 4.5 mEq/L (ref 3.5–5.1)
Sodium: 140 mEq/L (ref 136–145)
TOTAL PROTEIN: 6.6 g/dL (ref 6.4–8.3)

## 2015-12-17 MED ORDER — SODIUM CHLORIDE 0.9 % IV SOLN
375.0000 mg/m2 | Freq: Once | INTRAVENOUS | Status: AC
  Administered 2015-12-17: 1100 mg via INTRAVENOUS
  Filled 2015-12-17: qty 100

## 2015-12-17 MED ORDER — ACETAMINOPHEN 325 MG PO TABS
650.0000 mg | ORAL_TABLET | Freq: Once | ORAL | Status: AC
  Administered 2015-12-17: 650 mg via ORAL

## 2015-12-17 MED ORDER — DIPHENHYDRAMINE HCL 25 MG PO CAPS
50.0000 mg | ORAL_CAPSULE | Freq: Once | ORAL | Status: AC
  Administered 2015-12-17: 50 mg via ORAL

## 2015-12-17 MED ORDER — SODIUM CHLORIDE 0.9 % IV SOLN
Freq: Once | INTRAVENOUS | Status: AC
  Administered 2015-12-17: 12:00:00 via INTRAVENOUS

## 2015-12-17 MED ORDER — SODIUM CHLORIDE 0.9% FLUSH
10.0000 mL | INTRAVENOUS | Status: DC | PRN
  Administered 2015-12-17: 10 mL
  Filled 2015-12-17: qty 10

## 2015-12-17 MED ORDER — SODIUM CHLORIDE 0.9 % IJ SOLN
10.0000 mL | INTRAMUSCULAR | Status: AC | PRN
  Administered 2015-12-17: 10 mL
  Filled 2015-12-17: qty 10

## 2015-12-17 MED ORDER — ACETAMINOPHEN 325 MG PO TABS
ORAL_TABLET | ORAL | Status: AC
  Filled 2015-12-17: qty 2

## 2015-12-17 MED ORDER — DIPHENHYDRAMINE HCL 25 MG PO CAPS
ORAL_CAPSULE | ORAL | Status: AC
  Filled 2015-12-17: qty 2

## 2015-12-17 NOTE — Progress Notes (Signed)
At 1247 rate of Rituxan was increased to 100mg /125ml per hour per patient's request and discussion with MD Alvy Bimler to run it as a rapid Rituxan. MD Alvy Bimler was called by myself at 1240 and she stated that it was meant to be ordered as rapid Rituxan and "tell pharmacy it is ok to run it as rapid". Arbie Cookey in pharmacy assisted with calculations of Rituxan. Patient was education to tell the nurse immediately if he experiences anything different, including shortness of breath.

## 2015-12-17 NOTE — Progress Notes (Signed)
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 [facility])   06/24/2009 Imaging PET CT scan showed two small FDG positive left neck nodes   12/11/2014 Surgery He underwent excisional lymph node biopsy of the left supraclavicular lymph node/neck region   12/11/2014 Pathology Results Accession: [id number] biopsy show follicular lymphoma   [id number] Imaging ECHO showed LVH but preserved EF   12/25/2014 Imaging PET scan showed disease above and below diaphragm   12/28/2014 Procedure He has port placement   12/31/2014 - 01/01/2015 Chemotherapy He received 1 cycle of bendamustine with rituximab, discontinued due to suspicion of allergic reaction to bendamustine   01/12/2015 - 01/19/2015 Hospital Admission He was admitted to the hospital with suspicious allergic reaction, significant bilateral lower extremity edema with cellulitis, pneumonia and mild fluid overload   03/18/2015 - 04/08/2015 Chemotherapy Treatment is switched to rituximab, Cytoxan, vincristine and prednisone   04/29/2015 Imaging  PET CT scan showed near complete response to treatment.   04/30/2015 -  Chemotherapy He received maintenance Rituxan    INTERVAL HISTORY: Please see below for problem oriented charting. He returns prior to treatment today. He is doing well without recent infection. He continues to struggle with morbid obesity chronic COPD with shortness of breath on minimal exertion, bilateral lower extremity edema and chronic cellulitis in both legs   He continues to smoke.  No new lymphadenopathy.    REVIEW OF SYSTEMS:   Constitutional: Denies fevers, chills or abnormal weight loss Eyes: Denies blurriness of vision Ears, nose, mouth, throat, and face: Denies mucositis or sore throat Cardiovascular: Denies palpitation, chest discomfort  Gastrointestinal:  Denies nausea, heartburn or change in bowel habits Lymphatics: Denies new lymphadenopathy or easy bruising Neurological:Denies numbness, tingling or new weaknesses Behavioral/Psych: Mood is stable, no new changes  All other systems were reviewed with the patient and are negative.  I have reviewed the past medical history, past surgical history, social history and family history with the patient and they are unchanged from previous note.  ALLERGIES:  is allergic to bendamustine hcl; heparin; and tape.  MEDICATIONS:  Current Outpatient Prescriptions  Medication Sig Dispense Refill  . albuterol (PROVENTIL) (2.5 MG/3ML) 0.083% nebulizer solution Take 3 mLs (2.5 mg total) by nebulization every 2 (two) hours as needed for wheezing. 75 mL 2  . ANORO ELLIPTA 62.5-25 MCG/INH AEPB Inhale 1 puff into the lungs daily as needed. For wheezing & SOB 60 each 5  . aspirin EC 81 MG tablet Take 81 mg by mouth daily.    . clonazePAM (KLONOPIN) 0.5 MG tablet TAKE 1 TABLET TWICE A DAY AS NEEDED FOR ANXIETY OR AT BEDTME FOR INSOMNIA 180 tablet 1  . clopidogrel (PLAVIX) 75 MG tablet TAKE 1 TABLET DAILY WITH   BREAKFAST 90 tablet 3  . KLOR-CON M20 20 MEQ tablet TAKE 1 TABLET DAILY 90 tablet 1  . metoprolol succinate (TOPROL-XL) 50 MG 24 hr tablet Take 100 mg by mouth daily.    Marland Kitchen oxyCODONE-acetaminophen (PERCOCET/ROXICET) 5-325 MG tablet Take 1 tablet by mouth every 6 (six) hours as needed  for severe pain. 120 tablet 0  . simvastatin (ZOCOR) 20 MG tablet TAKE 1 TABLET AT BEDTIME 90 tablet 3  . torsemide (DEMADEX) 100 MG tablet Take 1 tablet (100 mg total) by mouth daily. 90 tablet 3  . nitroGLYCERIN  (NITROSTAT) 0.4 MG SL tablet Place 1 tablet (0.4 mg total) under the tongue every 5 (five) minutes as needed for chest pain (Up to 3 doses). 25 tablet 4   No current facility-administered medications for this visit.   Facility-Administered Medications Ordered in Other Visits  Medication Dose Route Frequency Provider Last Rate Last Dose  . sodium chloride flush (NS) 0.9 % injection 10 mL  10 mL Intracatheter PRN Heath Lark, MD        PHYSICAL EXAMINATION: ECOG PERFORMANCE STATUS: 0 - Asymptomatic  Filed Vitals:   12/17/15 1042  BP: 141/86  Pulse: 72  Temp: 99 F (37.2 C)  Resp: 20   Filed Weights   12/17/15 1042  Weight: 361 lb 12.8 oz (164.111 kg)    GENERAL:alert, no distress and comfortable SKIN: skin color, texture, turgor are normal, no rashes or significant lesions. Chronic cellulitic changes on both legs EYES: normal, Conjunctiva are pink and non-injected, sclera clear OROPHARYNX:no exudate, no erythema and lips, buccal mucosa, and tongue normal  NECK: supple, thyroid normal size, non-tender, without nodularity LYMPH:  no palpable lymphadenopathy in the cervical, axillary or inguinal LUNGS: clear to auscultation and percussion with normal breathing effort. Scattered wheezes bilaterally HEART: regular rate & rhythm and no murmurs with moderate lower extremity edema ABDOMEN:abdomen soft, non-tender and normal bowel sounds Musculoskeletal:no cyanosis of digits and no clubbing  NEURO: alert & oriented x 3 with fluent speech, no focal motor/sensory deficits  LABORATORY DATA:  I have reviewed the data as listed    Component Value Date/Time   NA 140 12/17/2015 1001   NA 139 09/28/2015 1623   K 4.5 12/17/2015 1001   K 4.4 09/28/2015 1623   CL 97 09/28/2015 1623   CO2 28 12/17/2015 1001   CO2 37* 09/28/2015 1623   GLUCOSE 134 12/17/2015 1001   GLUCOSE 107* 09/28/2015 1623   BUN 11.4 12/17/2015 1001   BUN 13 09/28/2015 1623   CREATININE 0.8 12/17/2015 1001    CREATININE 0.87 09/28/2015 1623   CALCIUM 9.0 12/17/2015 1001   CALCIUM 9.7 09/28/2015 1623   PROT 6.6 12/17/2015 1001   PROT 6.6 01/15/2015 0500   ALBUMIN 3.8 12/17/2015 1001   ALBUMIN 3.1* 01/15/2015 0500   AST 19 12/17/2015 1001   AST 22 01/15/2015 0500   ALT 30 12/17/2015 1001   ALT 27 01/15/2015 0500   ALKPHOS 91 12/17/2015 1001   ALKPHOS 57 01/15/2015 0500   BILITOT <0.30 12/17/2015 1001   BILITOT 0.6 01/15/2015 0500   GFRNONAA >60 01/18/2015 0320   GFRAA >60 01/18/2015 0320    No results found for: SPEP, UPEP  Lab Results  Component Value Date   WBC 9.7 12/17/2015   NEUTROABS 7.8* 12/17/2015   HGB 14.5 12/17/2015   HCT 44.9 12/17/2015   MCV 99.7* 12/17/2015   PLT 122* 12/17/2015      Chemistry      Component Value Date/Time   NA 140 12/17/2015 1001   NA 139 09/28/2015 1623   K 4.5 12/17/2015 1001   K 4.4 09/28/2015 1623   CL 97 09/28/2015 1623   CO2 28 12/17/2015 1001   CO2 37* 09/28/2015 1623   BUN 11.4 12/17/2015 1001   BUN 13 09/28/2015 1623  CREATININE 0.8 12/17/2015 1001   CREATININE 0.87 09/28/2015 1623      Component Value Date/Time   CALCIUM 9.0 12/17/2015 1001   CALCIUM 9.7 09/28/2015 1623   ALKPHOS 91 12/17/2015 1001   ALKPHOS 57 01/15/2015 0500   AST 19 12/17/2015 1001   AST 22 01/15/2015 0500   ALT 30 12/17/2015 1001   ALT 27 01/15/2015 0500   BILITOT <0.30 12/17/2015 1001   BILITOT 0.6 01/15/2015 0500     ASSESSMENT & PLAN:  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 He has poor tolerance to chemotherapy with recurrent infection. He is doing well on maintenance rituximab only and he agreed with the plan. I will see him every 8 weeks with history, blood work, physical examination and rituximab  Chronic diastolic CHF (congestive heart failure) ([place]) The patient has no evidence of CHF exacerbation except for chronic leg  swelling Continue medical management I recommend him to quit smoking   COPD mixed type The patient have chronic hypoxemia and wears oxygen intermittently. He follows closely with pulmonologist. His respiratory status improved and he will continue medical management I recommend him to quit smoking  Thrombocytopenia ([place]) This is likely due to recent treatment. The patient denies recent history of bleeding such as epistaxis, hematuria or hematochezia. He is asymptomatic from the low platelet count. I will observe for now.   Tobacco abuse I spent some time counseling the patient the importance of tobacco cessation. He is currently not interested to quit now.    No orders of the defined types were placed in this encounter.   All questions were answered. The patient knows to call the clinic with any problems, questions or concerns. No barriers to learning was detected. I spent 20 minutes counseling the patient face to face. The total time spent in the appointment was 25 minutes and more than 50% was on counseling and review of test results     [facility], [place], MD 12/17/2015 3:06 PM

## 2015-12-17 NOTE — Assessment & Plan Note (Signed)
The patient have chronic hypoxemia and wears oxygen intermittently. He follows closely with pulmonologist. His respiratory status improved and he will continue medical management I recommend him to quit smoking

## 2015-12-17 NOTE — Assessment & Plan Note (Addendum)
His last PET scan in February 2017 showed complete response to treatment. He has poor tolerance to chemotherapy with recurrent infection. He is doing well on maintenance rituximab only and he agreed with the plan. I will see him every 8 weeks with history, blood work, physical examination and rituximab

## 2015-12-17 NOTE — Patient Instructions (Signed)
[facility] Discharge Instructions for Patients Receiving Chemotherapy  Today you received the following chemotherapy agents: Rituxan   To help prevent nausea and vomiting after your treatment, we encourage you to take your nausea medication as directed.    If you develop nausea and vomiting that is not controlled by your nausea medication, call the clinic.   BELOW ARE SYMPTOMS THAT SHOULD BE REPORTED IMMEDIATELY:  *FEVER GREATER THAN 100.5 F  *CHILLS WITH OR WITHOUT FEVER  NAUSEA AND VOMITING THAT IS NOT CONTROLLED WITH YOUR NAUSEA MEDICATION  *UNUSUAL SHORTNESS OF BREATH  *UNUSUAL BRUISING OR BLEEDING  TENDERNESS IN MOUTH AND THROAT WITH OR WITHOUT PRESENCE OF ULCERS  *URINARY PROBLEMS  *BOWEL PROBLEMS  UNUSUAL RASH Items with * indicate a potential emergency and should be followed up as soon as possible.  Feel free to call the clinic you have any questions or concerns. The clinic phone number is [phone].  Please show the CHEMO ALERT CARD at check-in to the Emergency Department and triage nurse.   

## 2015-12-17 NOTE — Assessment & Plan Note (Signed)
I spent some time counseling the patient the importance of tobacco cessation. He is currently not interested to quit now.  

## 2015-12-17 NOTE — Assessment & Plan Note (Signed)
This is likely due to recent treatment. The patient denies recent history of bleeding such as epistaxis, hematuria or hematochezia. He is asymptomatic from the low platelet count. I will observe for now.  

## 2015-12-17 NOTE — Assessment & Plan Note (Signed)
The patient has no evidence of CHF exacerbation except for chronic leg swelling Continue medical management I recommend him to quit smoking  

## 2015-12-17 NOTE — Telephone Encounter (Signed)
appt add for July per dr Alvy Bimler no pof received gave pt appt & avs

## 2015-12-24 ENCOUNTER — Ambulatory Visit: Payer: BLUE CROSS/BLUE SHIELD | Admitting: Cardiovascular Disease

## 2015-12-27 ENCOUNTER — Ambulatory Visit (INDEPENDENT_AMBULATORY_CARE_PROVIDER_SITE_OTHER): Payer: BLUE CROSS/BLUE SHIELD | Admitting: Internal Medicine

## 2015-12-27 ENCOUNTER — Encounter: Payer: Self-pay | Admitting: Internal Medicine

## 2015-12-27 VITALS — BP 130/70 | HR 61 | Wt 352.0 lb

## 2015-12-27 DIAGNOSIS — E1151 Type 2 diabetes mellitus with diabetic peripheral angiopathy without gangrene: Secondary | ICD-10-CM

## 2015-12-27 DIAGNOSIS — I5032 Chronic diastolic (congestive) heart failure: Secondary | ICD-10-CM | POA: Diagnosis not present

## 2015-12-27 DIAGNOSIS — I10 Essential (primary) hypertension: Secondary | ICD-10-CM

## 2015-12-27 DIAGNOSIS — M79604 Pain in right leg: Secondary | ICD-10-CM

## 2015-12-27 DIAGNOSIS — C8238 Follicular lymphoma grade IIIa, lymph nodes of multiple sites: Secondary | ICD-10-CM

## 2015-12-27 DIAGNOSIS — I872 Venous insufficiency (chronic) (peripheral): Secondary | ICD-10-CM

## 2015-12-27 DIAGNOSIS — M545 Low back pain: Secondary | ICD-10-CM

## 2015-12-27 MED ORDER — CLONAZEPAM 0.5 MG PO TABS: ORAL_TABLET | ORAL | Status: DC

## 2015-12-27 MED ORDER — OXYCODONE-ACETAMINOPHEN 5-325 MG PO TABS: 1.0000 | ORAL_TABLET | Freq: Four times a day (QID) | ORAL | Status: DC | PRN

## 2015-12-27 MED ORDER — OXYCODONE-ACETAMINOPHEN 5-325 MG PO TABS
1.0000 | ORAL_TABLET | Freq: Four times a day (QID) | ORAL | Status: DC | PRN
Start: 2015-12-27 — End: 2015-12-27

## 2015-12-27 NOTE — Assessment & Plan Note (Signed)
Chronic  Percocet prn  Potential benefits of a long term opioids use as well as potential risks (i.e. addiction risk, apnea etc) and complications (i.e. Somnolence, constipation and others) were explained to the patient and were aknowledged. 

## 2015-12-27 NOTE — Progress Notes (Signed)
Pre visit review using our clinic review tool, if applicable. No additional management support is needed unless otherwise documented below in the visit note. 

## 2015-12-27 NOTE — Assessment & Plan Note (Signed)
Labs

## 2015-12-27 NOTE — Assessment & Plan Note (Signed)
On Torsemide, Hydralazine Refusing CPAP NAS diet  

## 2015-12-27 NOTE — Assessment & Plan Note (Signed)
On Torsemide, Hydralazine Refusing CPAP

## 2015-12-27 NOTE — Assessment & Plan Note (Signed)
F/u w/Dr Alvy Bimler On chemo

## 2015-12-27 NOTE — Progress Notes (Signed)
Subjective:  Patient ID: [name], male    DOB: 08/16/1955  Age: 60 y.o. MRN: [id number]  CC: No chief complaint on file.   HPI [name] presents for CHF, OA/LBP, edema, COPD f/u  Outpatient Prescriptions Prior to Visit  Medication Sig Dispense Refill  . albuterol (PROVENTIL) (2.5 MG/3ML) 0.083% nebulizer solution Take 3 mLs (2.5 mg total) by nebulization every 2 (two) hours as needed for wheezing. 75 mL 2  . ANORO ELLIPTA 62.5-25 MCG/INH AEPB Inhale 1 puff into the lungs daily as needed. For wheezing & SOB 60 each 5  . aspirin EC 81 MG tablet Take 81 mg by mouth daily.    . clonazePAM (KLONOPIN) 0.5 MG tablet TAKE 1 TABLET TWICE A DAY AS NEEDED FOR ANXIETY OR AT BEDTME FOR INSOMNIA 180 tablet 1  . clopidogrel (PLAVIX) 75 MG tablet TAKE 1 TABLET DAILY WITH   BREAKFAST 90 tablet 3  . KLOR-CON M20 20 MEQ tablet TAKE 1 TABLET DAILY 90 tablet 1  . metoprolol succinate (TOPROL-XL) 50 MG 24 hr tablet Take 100 mg by mouth daily.    Marland Kitchen oxyCODONE-acetaminophen (PERCOCET/ROXICET) 5-325 MG tablet Take 1 tablet by mouth every 6 (six) hours as needed for severe pain. 120 tablet 0  . simvastatin (ZOCOR) 20 MG tablet TAKE 1 TABLET AT BEDTIME 90 tablet 3  . torsemide (DEMADEX) 100 MG tablet Take 1 tablet (100 mg total) by mouth daily. 90 tablet 3  . nitroGLYCERIN (NITROSTAT) 0.4 MG SL tablet Place 1 tablet (0.4 mg total) under the tongue every 5 (five) minutes as needed for chest pain (Up to 3 doses). 25 tablet 4   No facility-administered medications prior to visit.    ROS Review of Systems  Constitutional: Positive for fatigue and unexpected weight change. Negative for appetite change.  HENT: Negative for congestion, nosebleeds, sneezing, sore throat and trouble swallowing.   Eyes: Negative for itching and visual disturbance.  Respiratory: Positive for shortness of breath. Negative for cough.   Cardiovascular: Positive for leg swelling. Negative for chest pain and palpitations.    Gastrointestinal: Negative for nausea, diarrhea, blood in stool and abdominal distention.  Genitourinary: Negative for frequency and hematuria.  Musculoskeletal: Positive for back pain, arthralgias, gait problem and neck stiffness. Negative for joint swelling and neck pain.  Skin: Negative for rash.  Neurological: Negative for dizziness, tremors, speech difficulty and weakness.  Psychiatric/Behavioral: Negative for sleep disturbance, dysphoric mood and agitation. The patient is not nervous/anxious.     Objective:  BP 130/70 mmHg  Pulse 61  Wt 352 lb (159.666 kg)  SpO2 92%  BP Readings from Last 3 Encounters:  12/27/15 130/70  12/17/15 147/61  12/17/15 141/86    Wt Readings from Last 3 Encounters:  12/27/15 352 lb (159.666 kg)  12/17/15 361 lb 12.8 oz (164.111 kg)  10/15/15 355 lb 3.2 oz (161.118 kg)    Physical Exam  Constitutional: He is oriented to person, place, and time. He appears well-developed. No distress.  NAD  HENT:  Mouth/Throat: Oropharynx is clear and moist.  Eyes: Conjunctivae are normal. Pupils are equal, round, and reactive to light.  Neck: Normal range of motion. No JVD present. No thyromegaly present.  Cardiovascular: Normal rate, regular rhythm, normal heart sounds and intact distal pulses.  Exam reveals no gallop and no friction rub.   No murmur heard. Pulmonary/Chest: Effort normal and breath sounds normal. No respiratory distress. He has no wheezes. He has no rales. He exhibits no tenderness.  Abdominal:  Soft. Bowel sounds are normal. He exhibits no distension and no mass. There is no tenderness. There is no rebound and no guarding.  Musculoskeletal: Normal range of motion. He exhibits edema and tenderness.  Lymphadenopathy:    He has no cervical adenopathy.  Neurological: He is alert and oriented to person, place, and time. He has normal reflexes. No cranial nerve deficit. He exhibits normal muscle tone. He displays a negative Romberg sign.  Coordination and gait normal.  Skin: Skin is warm and dry. No rash noted.  Psychiatric: He has a normal mood and affect. His behavior is normal. Judgment and thought content normal.  Obese LEs are wrapped  Lab Results  Component Value Date   WBC 9.7 12/17/2015   HGB 14.5 12/17/2015   HCT 44.9 12/17/2015   PLT 122* 12/17/2015   GLUCOSE 134 12/17/2015   CHOL 131 03/25/2014   TRIG 185.0* 03/25/2014   HDL 24.00* 03/25/2014   LDLCALC 70 03/25/2014   ALT 30 12/17/2015   AST 19 12/17/2015   NA 140 12/17/2015   K 4.5 12/17/2015   CL 97 09/28/2015   CREATININE 0.8 12/17/2015   BUN 11.4 12/17/2015   CO2 28 12/17/2015   TSH 2.01 02/25/2013   PSA 0.23 09/15/2011   INR 1.00 12/28/2014   HGBA1C 6.4 09/28/2015    No results found.  Assessment & Plan:   There are no diagnoses linked to this encounter. I am having [name] maintain his aspirin EC, nitroGLYCERIN, metoprolol succinate, simvastatin, clopidogrel, ANORO ELLIPTA, clonazePAM, KLOR-CON M20, albuterol, oxyCODONE-acetaminophen, and torsemide.  No orders of the defined types were placed in this encounter.     Follow-up: No Follow-up on file.  Walker Kehr, MD

## 2015-12-27 NOTE — Assessment & Plan Note (Signed)
On Torsemide, Hydralazine LEs are wrapped

## 2016-01-21 ENCOUNTER — Ambulatory Visit (INDEPENDENT_AMBULATORY_CARE_PROVIDER_SITE_OTHER): Payer: BLUE CROSS/BLUE SHIELD | Admitting: Cardiovascular Disease

## 2016-01-21 ENCOUNTER — Encounter: Payer: Self-pay | Admitting: Cardiovascular Disease

## 2016-01-21 VITALS — BP 150/70 | HR 68 | Ht 72.0 in | Wt 349.8 lb

## 2016-01-21 DIAGNOSIS — I10 Essential (primary) hypertension: Secondary | ICD-10-CM | POA: Diagnosis not present

## 2016-01-21 DIAGNOSIS — R011 Cardiac murmur, unspecified: Secondary | ICD-10-CM

## 2016-01-21 MED ORDER — TORSEMIDE 100 MG PO TABS: 100.0000 mg | ORAL_TABLET | Freq: Two times a day (BID) | ORAL | Status: DC

## 2016-01-21 MED ORDER — NITROGLYCERIN 0.4 MG SL SUBL: 0.4000 mg | SUBLINGUAL_TABLET | SUBLINGUAL | Status: AC | PRN

## 2016-01-21 MED ORDER — POTASSIUM CHLORIDE CRYS ER 20 MEQ PO TBCR: 20.0000 meq | EXTENDED_RELEASE_TABLET | Freq: Two times a day (BID) | ORAL | Status: DC

## 2016-01-21 NOTE — Patient Instructions (Addendum)

## 2016-01-21 NOTE — Progress Notes (Signed)
Patient ID: [name], male   DOB: 12-05-1955, 60 y.o.   MRN: [id number] 60 y.o. 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 He was evaluated recently for chest discomfort and a Myoview scan demonstrated inferolateral infarct and ischemia. Cardiac catheterization was arranged.  LHC 09/29/11: Distal left main 30%, ostial LAD 90%, LIMA-LAD patent, proximal RI occluded, ostial SVG-RI 95%, proximal RCA occluded, proximal SVG-PDA 95%, EF 55%. The patient underwent PCI with Dr. Burt Knack on the same date: Promus DES to the SVG-RI and Promus DES to the SVG-PDA. Labs: Hemoglobin 15.9, potassium 4.7, creatinine 0.4.  March 14 bilateral ICA [id number]   Has follicular lymphoma with recent LE edema/ cellulitis Also ? Melanoma on back with biopsy   ROS: Denies fever, malais, weight loss, blurry vision, decreased visual acuity, cough, sputum, SOB, hemoptysis, pleuritic pain, palpitaitons, heartburn, abdominal pain, melena, lower extremity edema, claudication, or rash.  All other systems reviewed and negative  General: Affect appropriate Obese male  HEENT: huge firm nodular mass in left supraclavicular fossa posterior to CEA scar  Neck supple with no adenopathy JVP normal left  bruits no thyromegaly left supraclavicular adenopathy Lungs clear with no wheezing and good diaphragmatic motion Heart:  S1/S2 SEM  murmur, no rub, gallop or click PMI normal Abdomen: benighn, BS positve, no tenderness, no AAA no bruit.  No HSM or HJR Distal pulses intact with no bruits Plus 2 bilateral  Edema with dressings on  Neuro non-focal Skin warm and dry No muscular weakness   Current Outpatient Prescriptions  Medication Sig Dispense Refill  . albuterol (PROVENTIL) (2.5 MG/3ML) 0.083% nebulizer solution Take 3 mLs (2.5 mg total) by  nebulization every 2 (two) hours as needed for wheezing. 75 mL 2  . ANORO ELLIPTA 62.5-25 MCG/INH AEPB Inhale 1 puff into the lungs daily as needed. For wheezing & SOB 60 each 5  . aspirin EC 81 MG tablet Take 81 mg by mouth daily.    . clonazePAM (KLONOPIN) 0.5 MG tablet TAKE 1 TABLET TWICE A DAY AS NEEDED FOR ANXIETY OR AT BEDTME FOR INSOMNIA 180 tablet 1  . clopidogrel (PLAVIX) 75 MG tablet TAKE 1 TABLET DAILY WITH   BREAKFAST 90 tablet 3  . KLOR-CON M20 20 MEQ tablet TAKE 1 TABLET DAILY 90 tablet 1  . metoprolol succinate (TOPROL-XL) 50 MG 24 hr tablet Take 100 mg by mouth daily.    Marland Kitchen oxyCODONE-acetaminophen (PERCOCET/ROXICET) 5-325 MG tablet Take 1 tablet by mouth every 6 (six) hours as needed for severe pain. 120 tablet 0  . simvastatin (ZOCOR) 20 MG tablet TAKE 1 TABLET AT BEDTIME 90 tablet 3  . torsemide (DEMADEX) 100 MG tablet Take 1 tablet (100 mg total) by mouth daily. 90 tablet 3  . nitroGLYCERIN (NITROSTAT) 0.4 MG SL tablet Place 1 tablet (0.4 mg total) under the tongue every 5 (five) minutes as needed for chest pain (Up to 3 doses). 25 tablet 1   No current facility-administered medications for this visit.    Allergies  Bendamustine hcl; Heparin; and Tape  Electrocardiogram:  06/27/13  SR rate 60  RBBB  Today SR rate 68  ICRBBB LAE   01/21/16  SR rate 66 RBBB Pac  Assessment and Plan CAD:  CABG 2005  Cath 2013 with stent to SVG IM and SVG PDA no angina stable continue medical Rx Edema:  Related to obesity  and chemo recent cellulitis  F/u primary and wound center continue wraps Demedex called In 100 bid so he has extra Lymphoma:  F/u oncology PET scan with no hypermetabolic spots  continue chemo  Lymphadenopathy much better  Chol:   Lab Results  Component Value Date   LDLCALC 70 03/25/2014   Bruit:  Post Left CEA with residual 40-59% bilateral disease ASA  F/u duplex in a year Murmur:  AV murmur f/u echo sclerosis in 2015   [facility]

## 2016-01-26 ENCOUNTER — Telehealth: Payer: Self-pay | Admitting: Internal Medicine

## 2016-01-26 NOTE — Telephone Encounter (Signed)
Needs OV notes for order of oxygen tank with discussion noted for insurance purposes.  Can fax to [phone]

## 2016-01-26 NOTE — Telephone Encounter (Signed)
I called [name] at [facility] to see what OV note she is needing. As soon as she answered the phone, the call was disconnected.  I reviewed recent notes and do not see where this was discussed recently.

## 2016-02-08 ENCOUNTER — Other Ambulatory Visit: Payer: Self-pay

## 2016-02-08 ENCOUNTER — Ambulatory Visit (HOSPITAL_COMMUNITY): Payer: BLUE CROSS/BLUE SHIELD | Attending: Cardiology

## 2016-02-08 DIAGNOSIS — I2510 Atherosclerotic heart disease of native coronary artery without angina pectoris: Secondary | ICD-10-CM | POA: Insufficient documentation

## 2016-02-08 DIAGNOSIS — I119 Hypertensive heart disease without heart failure: Secondary | ICD-10-CM | POA: Diagnosis not present

## 2016-02-08 DIAGNOSIS — I10 Essential (primary) hypertension: Secondary | ICD-10-CM

## 2016-02-08 DIAGNOSIS — E119 Type 2 diabetes mellitus without complications: Secondary | ICD-10-CM | POA: Diagnosis not present

## 2016-02-08 DIAGNOSIS — I77810 Thoracic aortic ectasia: Secondary | ICD-10-CM | POA: Diagnosis not present

## 2016-02-08 DIAGNOSIS — R011 Cardiac murmur, unspecified: Secondary | ICD-10-CM | POA: Diagnosis not present

## 2016-02-08 DIAGNOSIS — Z6841 Body Mass Index (BMI) 40.0 and over, adult: Secondary | ICD-10-CM | POA: Diagnosis not present

## 2016-02-08 DIAGNOSIS — Z720 Tobacco use: Secondary | ICD-10-CM | POA: Diagnosis not present

## 2016-02-08 DIAGNOSIS — E785 Hyperlipidemia, unspecified: Secondary | ICD-10-CM | POA: Insufficient documentation

## 2016-02-08 DIAGNOSIS — J449 Chronic obstructive pulmonary disease, unspecified: Secondary | ICD-10-CM | POA: Insufficient documentation

## 2016-02-08 LAB — ECHOCARDIOGRAM COMPLETE
AOASC: 37 cm
AVLVOTPG: 3 mmHg
CHL CUP DOP CALC LVOT VTI: 18.5 cm
CHL CUP MV DEC (S): 250
E decel time: 250 msec
FS: 31 % (ref 28–44)
IVS/LV PW RATIO, ED: 1
LA ID, A-P, ES: 47 mm
LA diam end sys: 47 mm
LA diam index: 1.61 cm/m2
LA vol A4C: 127 ml
LAVOL: 123 mL
LAVOLIN: 42.1 mL/m2
LDCA: 4.91 cm2
LV PW d: 14.5 mm — AB (ref 0.6–1.1)
LVOT SV: 91 mL
LVOT diameter: 25 mm
LVOTPV: 91 cm/s
MV Peak grad: 4 mmHg
MV pk A vel: 50.8 m/s
MVPKEVEL: 96.7 m/s
RV sys press: 30 mmHg
Reg peak vel: 261 cm/s
TR max vel: 261 cm/s

## 2016-02-23 ENCOUNTER — Encounter: Payer: Self-pay | Admitting: Internal Medicine

## 2016-02-23 ENCOUNTER — Ambulatory Visit (INDEPENDENT_AMBULATORY_CARE_PROVIDER_SITE_OTHER): Payer: BLUE CROSS/BLUE SHIELD | Admitting: Internal Medicine

## 2016-02-23 VITALS — BP 118/60 | HR 78 | Temp 98.7°F | Wt 351.0 lb

## 2016-02-23 DIAGNOSIS — R21 Rash and other nonspecific skin eruption: Secondary | ICD-10-CM | POA: Diagnosis not present

## 2016-02-23 DIAGNOSIS — G4733 Obstructive sleep apnea (adult) (pediatric): Secondary | ICD-10-CM

## 2016-02-23 DIAGNOSIS — J449 Chronic obstructive pulmonary disease, unspecified: Secondary | ICD-10-CM | POA: Diagnosis not present

## 2016-02-23 DIAGNOSIS — I5032 Chronic diastolic (congestive) heart failure: Secondary | ICD-10-CM | POA: Diagnosis not present

## 2016-02-23 NOTE — Progress Notes (Signed)
Pre visit review using our clinic review tool, if applicable. No additional management support is needed unless otherwise documented below in the visit note. 

## 2016-02-23 NOTE — Assessment & Plan Note (Signed)
  Refusing CPAP Needs O2 2 l/min [place]

## 2016-02-23 NOTE — Assessment & Plan Note (Signed)
On Torsemide, Hydralazine Refusing CPAP NAS diet Needs O2 2 l/min [place]

## 2016-02-23 NOTE — Assessment & Plan Note (Signed)
Refusing CPAP since 2006 Needs O2 2 l/min [place]

## 2016-02-23 NOTE — Progress Notes (Signed)
Subjective:  Patient ID: [name], male    DOB: September 12, 1955  Age: 60 y.o. MRN: [id number]  CC: No chief complaint on file.   HPI [name] presents for COPD, CHF, arthralgia f/u C/o rash under L breast and it has spread to the back - resolving; it looked like a ring (2 wks) C/o O2 at night - it was ordered by Anesthesia last year - his O2 was dropping last year during sleep due to OSA, CHF/COPD  Outpatient Prescriptions Prior to Visit  Medication Sig Dispense Refill  . albuterol (PROVENTIL) (2.5 MG/3ML) 0.083% nebulizer solution Take 3 mLs (2.5 mg total) by nebulization every 2 (two) hours as needed for wheezing. 75 mL 2  . ANORO ELLIPTA 62.5-25 MCG/INH AEPB Inhale 1 puff into the lungs daily as needed. For wheezing & SOB 60 each 5  . aspirin EC 81 MG tablet Take 81 mg by mouth daily.    . clonazePAM (KLONOPIN) 0.5 MG tablet TAKE 1 TABLET TWICE A DAY AS NEEDED FOR ANXIETY OR AT BEDTME FOR INSOMNIA 180 tablet 1  . clopidogrel (PLAVIX) 75 MG tablet TAKE 1 TABLET DAILY WITH   BREAKFAST 90 tablet 3  . metoprolol succinate (TOPROL-XL) 50 MG 24 hr tablet Take 100 mg by mouth daily.    . nitroGLYCERIN (NITROSTAT) 0.4 MG SL tablet Place 1 tablet (0.4 mg total) under the tongue every 5 (five) minutes as needed for chest pain (Up to 3 doses). 25 tablet 1  . oxyCODONE-acetaminophen (PERCOCET/ROXICET) 5-325 MG tablet Take 1 tablet by mouth every 6 (six) hours as needed for severe pain. 120 tablet 0  . potassium chloride SA (KLOR-CON M20) 20 MEQ tablet Take 1 tablet (20 mEq total) by mouth 2 (two) times daily. 180 tablet 3  . simvastatin (ZOCOR) 20 MG tablet TAKE 1 TABLET AT BEDTIME 90 tablet 3  . torsemide (DEMADEX) 100 MG tablet Take 1 tablet (100 mg total) by mouth 2 (two) times daily. 180 tablet 3   No facility-administered medications prior to visit.    ROS Review of Systems  Constitutional: Positive for fatigue. Negative for fever, appetite change and unexpected weight change.    HENT: Negative for congestion, nosebleeds, sneezing, sore throat and trouble swallowing.   Eyes: Negative for itching and visual disturbance.  Respiratory: Positive for apnea, choking, shortness of breath and wheezing. Negative for cough.   Cardiovascular: Positive for leg swelling. Negative for chest pain and palpitations.  Gastrointestinal: Negative for nausea, diarrhea, blood in stool and abdominal distention.  Genitourinary: Negative for frequency and hematuria.  Musculoskeletal: Positive for back pain and arthralgias. Negative for joint swelling, gait problem and neck pain.  Skin: Positive for color change and rash.  Neurological: Negative for dizziness, tremors, speech difficulty and weakness.  Psychiatric/Behavioral: Negative for suicidal ideas, sleep disturbance, dysphoric mood, decreased concentration and agitation. The patient is nervous/anxious.     Objective:  BP 118/60 mmHg  Pulse 78  Temp(Src) 98.7 F (37.1 C) (Oral)  Wt 351 lb (159.213 kg)  SpO2 93%  BP Readings from Last 3 Encounters:  02/23/16 118/60  01/21/16 150/70  12/27/15 130/70    Wt Readings from Last 3 Encounters:  02/23/16 351 lb (159.213 kg)  01/21/16 349 lb 12.8 oz (158.668 kg)  12/27/15 352 lb (159.666 kg)    Physical Exam  Constitutional: He is oriented to person, place, and time. He appears well-developed. No distress.  NAD  HENT:  Mouth/Throat: Oropharynx is clear and moist.  Eyes: Conjunctivae  are normal. Pupils are equal, round, and reactive to light.  Neck: Normal range of motion. No JVD present. No thyromegaly present.  Cardiovascular: Normal rate, regular rhythm, normal heart sounds and intact distal pulses.  Exam reveals no gallop and no friction rub.   No murmur heard. Pulmonary/Chest: Effort normal and breath sounds normal. No respiratory distress. He has no wheezes. He has no rales. He exhibits no tenderness.  Abdominal: Soft. Bowel sounds are normal. He exhibits no distension and  no mass. There is no tenderness. There is no rebound and no guarding.  Musculoskeletal: Normal range of motion. He exhibits no edema or tenderness.  Lymphadenopathy:    He has no cervical adenopathy.  Neurological: He is alert and oriented to person, place, and time. He has normal reflexes. No cranial nerve deficit. He exhibits normal muscle tone. He displays a negative Romberg sign. Coordination and gait normal.  Skin: Skin is warm and dry. No rash noted.  Psychiatric: He has a normal mood and affect. His behavior is normal. Judgment and thought content normal.  Obese Mild rhonchi B Gynecomastia Faint rash under L breast 1-2+ B ankle edema  Lab Results  Component Value Date   WBC 9.7 12/17/2015   HGB 14.5 12/17/2015   HCT 44.9 12/17/2015   PLT 122* 12/17/2015   GLUCOSE 134 12/17/2015   CHOL 131 03/25/2014   TRIG 185.0* 03/25/2014   HDL 24.00* 03/25/2014   LDLCALC 70 03/25/2014   ALT 30 12/17/2015   AST 19 12/17/2015   NA 140 12/17/2015   K 4.5 12/17/2015   CL 97 09/28/2015   CREATININE 0.8 12/17/2015   BUN 11.4 12/17/2015   CO2 28 12/17/2015   TSH 2.01 02/25/2013   PSA 0.23 09/15/2011   INR 1.00 12/28/2014   HGBA1C 6.4 09/28/2015    No results found.  Assessment & Plan:    The pt needs O2 at 2 l/min via [place] during sleep due to hypoxemia, OSA, CHF/COPD  Walker Kehr, MD

## 2016-02-23 NOTE — Assessment & Plan Note (Signed)
rash under L breast and it has spread to the back - resolving; it looked like a ring (2 wks)

## 2016-02-25 ENCOUNTER — Other Ambulatory Visit (HOSPITAL_BASED_OUTPATIENT_CLINIC_OR_DEPARTMENT_OTHER): Payer: BLUE CROSS/BLUE SHIELD

## 2016-02-25 ENCOUNTER — Ambulatory Visit (HOSPITAL_BASED_OUTPATIENT_CLINIC_OR_DEPARTMENT_OTHER): Payer: BLUE CROSS/BLUE SHIELD

## 2016-02-25 ENCOUNTER — Telehealth: Payer: Self-pay | Admitting: Hematology and Oncology

## 2016-02-25 ENCOUNTER — Ambulatory Visit: Payer: BLUE CROSS/BLUE SHIELD

## 2016-02-25 ENCOUNTER — Ambulatory Visit (HOSPITAL_BASED_OUTPATIENT_CLINIC_OR_DEPARTMENT_OTHER): Payer: BLUE CROSS/BLUE SHIELD | Admitting: Hematology and Oncology

## 2016-02-25 VITALS — BP 138/55 | HR 57 | Temp 98.4°F | Resp 16

## 2016-02-25 VITALS — BP 158/61 | HR 72 | Temp 98.8°F | Resp 19 | Ht 72.0 in | Wt 353.4 lb

## 2016-02-25 DIAGNOSIS — C8238 Follicular lymphoma grade IIIa, lymph nodes of multiple sites: Secondary | ICD-10-CM

## 2016-02-25 DIAGNOSIS — Z720 Tobacco use: Secondary | ICD-10-CM

## 2016-02-25 DIAGNOSIS — J449 Chronic obstructive pulmonary disease, unspecified: Secondary | ICD-10-CM

## 2016-02-25 DIAGNOSIS — Z5112 Encounter for antineoplastic immunotherapy: Secondary | ICD-10-CM

## 2016-02-25 DIAGNOSIS — I5032 Chronic diastolic (congestive) heart failure: Secondary | ICD-10-CM

## 2016-02-25 DIAGNOSIS — D696 Thrombocytopenia, unspecified: Secondary | ICD-10-CM | POA: Diagnosis not present

## 2016-02-25 DIAGNOSIS — R0609 Other forms of dyspnea: Secondary | ICD-10-CM

## 2016-02-25 DIAGNOSIS — R609 Edema, unspecified: Secondary | ICD-10-CM

## 2016-02-25 DIAGNOSIS — R600 Localized edema: Secondary | ICD-10-CM

## 2016-02-25 LAB — CBC WITH DIFFERENTIAL/PLATELET
BASO%: 0.9 % (ref 0.0–2.0)
BASOS ABS: 0.1 10*3/uL (ref 0.0–0.1)
EOS ABS: 0.3 10*3/uL (ref 0.0–0.5)
EOS%: 3.3 % (ref 0.0–7.0)
HCT: 46.2 % (ref 38.4–49.9)
HGB: 15.4 g/dL (ref 13.0–17.1)
LYMPH%: 9.7 % — AB (ref 14.0–49.0)
MCH: 32.7 pg (ref 27.2–33.4)
MCHC: 33.4 g/dL (ref 32.0–36.0)
MCV: 97.9 fL (ref 79.3–98.0)
MONO#: 1 10*3/uL — ABNORMAL HIGH (ref 0.1–0.9)
MONO%: 10.6 % (ref 0.0–14.0)
NEUT#: 7.4 10*3/uL — ABNORMAL HIGH (ref 1.5–6.5)
NEUT%: 75.5 % — AB (ref 39.0–75.0)
Platelets: 116 10*3/uL — ABNORMAL LOW (ref 140–400)
RBC: 4.72 10*6/uL (ref 4.20–5.82)
RDW: 13.2 % (ref 11.0–14.6)
WBC: 9.9 10*3/uL (ref 4.0–10.3)
lymph#: 1 10*3/uL (ref 0.9–3.3)

## 2016-02-25 LAB — COMPREHENSIVE METABOLIC PANEL
ALBUMIN: 3.9 g/dL (ref 3.5–5.0)
ALK PHOS: 87 U/L (ref 40–150)
ALT: 25 U/L (ref 0–55)
AST: 20 U/L (ref 5–34)
Anion Gap: 11 mEq/L (ref 3–11)
BUN: 10.6 mg/dL (ref 7.0–26.0)
CO2: 29 meq/L (ref 22–29)
Calcium: 9.2 mg/dL (ref 8.4–10.4)
Chloride: 100 mEq/L (ref 98–109)
Creatinine: 0.8 mg/dL (ref 0.7–1.3)
GLUCOSE: 103 mg/dL (ref 70–140)
POTASSIUM: 4.1 meq/L (ref 3.5–5.1)
SODIUM: 140 meq/L (ref 136–145)
Total Bilirubin: 0.39 mg/dL (ref 0.20–1.20)
Total Protein: 7 g/dL (ref 6.4–8.3)

## 2016-02-25 MED ORDER — ANTICOAGULANT SODIUM CITRATE 4% (200MG/5ML) IV SOLN
5.0000 mL | Freq: Once | Status: AC
  Administered 2016-02-25: 5 mL via INTRAVENOUS
  Filled 2016-02-25: qty 5

## 2016-02-25 MED ORDER — ACETAMINOPHEN 325 MG PO TABS
ORAL_TABLET | ORAL | Status: AC
  Filled 2016-02-25: qty 2

## 2016-02-25 MED ORDER — SODIUM CHLORIDE 0.9 % IJ SOLN
10.0000 mL | INTRAMUSCULAR | Status: AC | PRN
  Administered 2016-02-25: 10 mL
  Filled 2016-02-25: qty 10

## 2016-02-25 MED ORDER — ACETAMINOPHEN 325 MG PO TABS
650.0000 mg | ORAL_TABLET | Freq: Once | ORAL | Status: AC
  Administered 2016-02-25: 650 mg via ORAL

## 2016-02-25 MED ORDER — SODIUM CHLORIDE 0.9 % IV SOLN
375.0000 mg/m2 | Freq: Once | INTRAVENOUS | Status: AC
  Administered 2016-02-25: 1100 mg via INTRAVENOUS
  Filled 2016-02-25: qty 20

## 2016-02-25 MED ORDER — DIPHENHYDRAMINE HCL 25 MG PO CAPS
ORAL_CAPSULE | ORAL | Status: AC
  Filled 2016-02-25: qty 2

## 2016-02-25 MED ORDER — DIPHENHYDRAMINE HCL 25 MG PO CAPS
50.0000 mg | ORAL_CAPSULE | Freq: Once | ORAL | Status: AC
  Administered 2016-02-25: 50 mg via ORAL

## 2016-02-25 MED ORDER — SODIUM CHLORIDE 0.9% FLUSH
10.0000 mL | INTRAVENOUS | Status: DC | PRN
  Administered 2016-02-25: 10 mL
  Filled 2016-02-25: qty 10

## 2016-02-25 MED ORDER — SODIUM CHLORIDE 0.9 % IV SOLN
Freq: Once | INTRAVENOUS | Status: AC
  Administered 2016-02-25: 09:00:00 via INTRAVENOUS

## 2016-02-25 NOTE — Patient Instructions (Signed)

## 2016-02-25 NOTE — Telephone Encounter (Signed)
per pof to sch pt appt-gave pt copy of avs °

## 2016-02-25 NOTE — Patient Instructions (Signed)
[facility] Discharge Instructions for Patients Receiving Chemotherapy  Today you received the following chemotherapy agents: Rituxan   To help prevent nausea and vomiting after your treatment, we encourage you to take your nausea medication as directed.    If you develop nausea and vomiting that is not controlled by your nausea medication, call the clinic.   BELOW ARE SYMPTOMS THAT SHOULD BE REPORTED IMMEDIATELY:  *FEVER GREATER THAN 100.5 F  *CHILLS WITH OR WITHOUT FEVER  NAUSEA AND VOMITING THAT IS NOT CONTROLLED WITH YOUR NAUSEA MEDICATION  *UNUSUAL SHORTNESS OF BREATH  *UNUSUAL BRUISING OR BLEEDING  TENDERNESS IN MOUTH AND THROAT WITH OR WITHOUT PRESENCE OF ULCERS  *URINARY PROBLEMS  *BOWEL PROBLEMS  UNUSUAL RASH Items with * indicate a potential emergency and should be followed up as soon as possible.  Feel free to call the clinic you have any questions or concerns. The clinic phone number is [phone].  Please show the CHEMO ALERT CARD at check-in to the Emergency Department and triage nurse.   

## 2016-02-26 NOTE — Assessment & Plan Note (Signed)
The patient have chronic hypoxemia and wears oxygen intermittently. He follows closely with pulmonologist. His respiratory status improved and he will continue medical management I recommend him to quit smoking

## 2016-02-26 NOTE — Progress Notes (Signed)
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 [facility])   06/24/2009 Imaging PET CT scan showed two small FDG positive left neck nodes   12/11/2014 Surgery He underwent excisional lymph node biopsy of the left supraclavicular lymph node/neck region   12/11/2014 Pathology Results Accession: [id number] biopsy show follicular lymphoma   [id number] Imaging ECHO showed LVH but preserved EF   12/25/2014 Imaging PET scan showed disease above and below diaphragm   12/28/2014 Procedure He has port placement   12/31/2014 - 01/01/2015 Chemotherapy He received 1 cycle of bendamustine with rituximab, discontinued due to suspicion of allergic reaction to bendamustine   01/12/2015 - 01/19/2015 Hospital Admission He was admitted to the hospital with suspicious allergic reaction, significant bilateral lower extremity edema with cellulitis, pneumonia and mild fluid overload   03/18/2015 - 04/08/2015 Chemotherapy Treatment is switched to rituximab, Cytoxan, vincristine and prednisone   04/29/2015 Imaging  PET CT scan showed near complete response to treatment.   04/30/2015 -  Chemotherapy He received maintenance Rituxan    INTERVAL HISTORY: Please see below for problem oriented charting. He returns prior to treatment today. He is doing well without recent infection. He continues to struggle with morbid obesity chronic COPD with shortness of breath on minimal exertion and chronic bilateral lower extremity edema Prior cellulitis on  both legs had resolved He continues to smoke.  No new lymphadenopathy.    REVIEW OF SYSTEMS:   Constitutional: Denies fevers, chills or abnormal weight loss Eyes: Denies blurriness of vision Ears, nose, mouth, throat, and face: Denies mucositis or sore throat Cardiovascular: Denies palpitation, chest discomfort  Gastrointestinal:  Denies nausea, heartburn or change in bowel habits Skin: Denies abnormal skin rashes Lymphatics: Denies new lymphadenopathy or easy bruising Neurological:Denies numbness, tingling or new weaknesses Behavioral/Psych: Mood is stable, no new changes  All other systems were reviewed with the patient and are negative.  I have reviewed the past medical history, past surgical history, social history and family history with the patient and they are unchanged from previous note.  ALLERGIES:  is allergic to bendamustine hcl; heparin; and tape.  MEDICATIONS:  Current Outpatient Prescriptions  Medication Sig Dispense Refill  . albuterol (PROVENTIL) (2.5 MG/3ML) 0.083% nebulizer solution Take 3 mLs (2.5 mg total) by nebulization every 2 (two) hours as needed for wheezing. 75 mL 2  . ANORO ELLIPTA 62.5-25 MCG/INH AEPB Inhale 1 puff into the lungs daily as needed. For wheezing & SOB 60 each 5  . aspirin EC 81 MG tablet Take 81 mg by mouth daily.    . clonazePAM (KLONOPIN) 0.5 MG tablet TAKE 1 TABLET TWICE A DAY AS NEEDED FOR ANXIETY OR AT BEDTME FOR INSOMNIA 180 tablet 1  . clopidogrel (PLAVIX) 75 MG tablet TAKE 1 TABLET DAILY WITH   BREAKFAST 90 tablet 3  . metoprolol succinate (TOPROL-XL) 50 MG 24 hr tablet Take 100 mg by mouth daily.    . nitroGLYCERIN (NITROSTAT) 0.4 MG SL tablet Place 1 tablet (0.4 mg total) under the tongue every 5 (five) minutes as needed for chest  pain (Up to 3 doses). 25 tablet 1  . oxyCODONE-acetaminophen (PERCOCET/ROXICET) 5-325 MG tablet Take 1 tablet by mouth every 6 (six) hours as needed for severe pain. 120 tablet 0  . potassium chloride SA  (KLOR-CON M20) 20 MEQ tablet Take 1 tablet (20 mEq total) by mouth 2 (two) times daily. 180 tablet 3  . simvastatin (ZOCOR) 20 MG tablet TAKE 1 TABLET AT BEDTIME 90 tablet 3  . torsemide (DEMADEX) 100 MG tablet Take 1 tablet (100 mg total) by mouth 2 (two) times daily. 180 tablet 3   No current facility-administered medications for this visit.    PHYSICAL EXAMINATION: ECOG PERFORMANCE STATUS: 1  Filed Vitals:   02/25/16 0820  BP: 158/61  Pulse: 72  Temp: 98.8 F (37.1 C)  Resp: 19   Filed Weights   02/25/16 0820  Weight: 353 lb 6.4 oz (160.301 kg)    GENERAL:alert, no distress and comfortable. He is morbidly obese SKIN: skin color, texture, turgor are normal, no rashes or significant lesions. Prior cellulitis had healed EYES: normal, Conjunctiva are pink and non-injected, sclera clear OROPHARYNX:no exudate, no erythema and lips, buccal mucosa, and tongue normal  NECK: supple, thyroid normal size, non-tender, without nodularity LYMPH:  no palpable lymphadenopathy in the cervical, axillary or inguinal LUNGS: clear to auscultation and percussion with normal breathing effort HEART: regular rate & rhythm and no murmurs with moderate lower extremity edema ABDOMEN:abdomen soft, non-tender and normal bowel sounds Musculoskeletal:no cyanosis of digits and no clubbing  NEURO: alert & oriented x 3 with fluent speech, no focal motor/sensory deficits  LABORATORY DATA:  I have reviewed the data as listed    Component Value Date/Time   NA 140 02/25/2016 0756   NA 139 09/28/2015 1623   K 4.1 02/25/2016 0756   K 4.4 09/28/2015 1623   CL 97 09/28/2015 1623   CO2 29 02/25/2016 0756   CO2 37* 09/28/2015 1623   GLUCOSE 103 02/25/2016 0756   GLUCOSE 107* 09/28/2015 1623   BUN 10.6 02/25/2016 0756   BUN 13 09/28/2015 1623   CREATININE 0.8 02/25/2016 0756   CREATININE 0.87 09/28/2015 1623   CALCIUM 9.2 02/25/2016 0756   CALCIUM 9.7 09/28/2015 1623   PROT 7.0 02/25/2016 0756   PROT 6.6  01/15/2015 0500   ALBUMIN 3.9 02/25/2016 0756   ALBUMIN 3.1* 01/15/2015 0500   AST 20 02/25/2016 0756   AST 22 01/15/2015 0500   ALT 25 02/25/2016 0756   ALT 27 01/15/2015 0500   ALKPHOS 87 02/25/2016 0756   ALKPHOS 57 01/15/2015 0500   BILITOT 0.39 02/25/2016 0756   BILITOT 0.6 01/15/2015 0500   GFRNONAA >60 01/18/2015 0320   GFRAA >60 01/18/2015 0320    No results found for: SPEP, UPEP  Lab Results  Component Value Date   WBC 9.9 02/25/2016   NEUTROABS 7.4* 02/25/2016   HGB 15.4 02/25/2016   HCT 46.2 02/25/2016   MCV 97.9 02/25/2016   PLT 116* 02/25/2016      Chemistry      Component Value Date/Time   NA 140 02/25/2016 0756   NA 139 09/28/2015 1623   K 4.1 02/25/2016 0756   K 4.4 09/28/2015 1623   CL 97 09/28/2015 1623   CO2 29 02/25/2016 0756   CO2 37* 09/28/2015 1623   BUN 10.6 02/25/2016 0756   BUN 13 09/28/2015 1623   CREATININE 0.8 02/25/2016 0756   CREATININE 0.87 09/28/2015 1623      Component Value Date/Time   CALCIUM 9.2  02/25/2016 0756   CALCIUM 9.7 09/28/2015 1623   ALKPHOS 87 02/25/2016 0756   ALKPHOS 57 01/15/2015 0500   AST 20 02/25/2016 0756   AST 22 01/15/2015 0500   ALT 25 02/25/2016 0756   ALT 27 01/15/2015 0500   BILITOT 0.39 02/25/2016 0756   BILITOT 0.6 01/15/2015 0500      ASSESSMENT & PLAN:  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 Previously, he has poor tolerance to chemotherapy with recurrent infection. He is doing well on maintenance rituximab only and he agreed with the plan. I will see him every 8 weeks with history, blood work, physical examination and rituximab, until September 2018  COPD mixed type [facility]) The patient have chronic hypoxemia and wears oxygen intermittently. He follows closely with pulmonologist. His respiratory status improved and he will continue medical management I recommend him to quit  smoking  Bilateral lower extremity edema This is related to morbid obesity and chronic congestive heart failure. He will continue diuretic therapy as directed.   Thrombocytopenia ([place]) This is likely due to recent treatment. The patient denies recent history of bleeding such as epistaxis, hematuria or hematochezia. He is asymptomatic from the low platelet count. I will observe for now.  Tobacco abuse I spent some time counseling the patient the importance of tobacco cessation. He is currently not interested to quit now.   No orders of the defined types were placed in this encounter.   All questions were answered. The patient knows to call the clinic with any problems, questions or concerns. No barriers to learning was detected. I spent 20 minutes on counseling the patient face to face. The total time spent in the appointment was 25 minutes and more than 50% was on counseling and review of test results     [facility], [place], MD 02/26/2016 2:39 PM

## 2016-02-26 NOTE — Assessment & Plan Note (Signed)
This is likely due to recent treatment. The patient denies recent history of bleeding such as epistaxis, hematuria or hematochezia. He is asymptomatic from the low platelet count. I will observe for now.  

## 2016-02-26 NOTE — Assessment & Plan Note (Signed)
I spent some time counseling the patient the importance of tobacco cessation. He is currently not interested to quit now.  

## 2016-02-26 NOTE — Assessment & Plan Note (Signed)
His last PET scan in February 2017 showed complete response to treatment. Previously, he has poor tolerance to chemotherapy with recurrent infection. He is doing well on maintenance rituximab only and he agreed with the plan. I will see him every 8 weeks with history, blood work, physical examination and rituximab, until September 2018

## 2016-02-26 NOTE — Assessment & Plan Note (Signed)
This is related to morbid obesity and chronic congestive heart failure. He will continue diuretic therapy as directed.  

## 2016-03-15 DIAGNOSIS — J449 Chronic obstructive pulmonary disease, unspecified: Secondary | ICD-10-CM | POA: Diagnosis not present

## 2016-03-29 ENCOUNTER — Ambulatory Visit (INDEPENDENT_AMBULATORY_CARE_PROVIDER_SITE_OTHER): Payer: BLUE CROSS/BLUE SHIELD | Admitting: Internal Medicine

## 2016-03-29 ENCOUNTER — Other Ambulatory Visit (INDEPENDENT_AMBULATORY_CARE_PROVIDER_SITE_OTHER): Payer: BLUE CROSS/BLUE SHIELD

## 2016-03-29 ENCOUNTER — Encounter: Payer: Self-pay | Admitting: Internal Medicine

## 2016-03-29 DIAGNOSIS — E1151 Type 2 diabetes mellitus with diabetic peripheral angiopathy without gangrene: Secondary | ICD-10-CM

## 2016-03-29 DIAGNOSIS — I5032 Chronic diastolic (congestive) heart failure: Secondary | ICD-10-CM

## 2016-03-29 DIAGNOSIS — M545 Low back pain: Secondary | ICD-10-CM

## 2016-03-29 DIAGNOSIS — C8228 Follicular lymphoma grade III, unspecified, lymph nodes of multiple sites: Secondary | ICD-10-CM

## 2016-03-29 DIAGNOSIS — G4700 Insomnia, unspecified: Secondary | ICD-10-CM

## 2016-03-29 DIAGNOSIS — E669 Obesity, unspecified: Secondary | ICD-10-CM | POA: Diagnosis not present

## 2016-03-29 DIAGNOSIS — M79604 Pain in right leg: Secondary | ICD-10-CM

## 2016-03-29 DIAGNOSIS — M79605 Pain in left leg: Secondary | ICD-10-CM

## 2016-03-29 LAB — HEPATIC FUNCTION PANEL
ALK PHOS: 77 U/L (ref 39–117)
ALT: 28 U/L (ref 0–53)
AST: 21 U/L (ref 0–37)
Albumin: 4.8 g/dL (ref 3.5–5.2)
BILIRUBIN DIRECT: 0.2 mg/dL (ref 0.0–0.3)
Total Bilirubin: 0.5 mg/dL (ref 0.2–1.2)
Total Protein: 7.6 g/dL (ref 6.0–8.3)

## 2016-03-29 LAB — BASIC METABOLIC PANEL
BUN: 13 mg/dL (ref 6–23)
CHLORIDE: 99 meq/L (ref 96–112)
CO2: 36 meq/L — AB (ref 19–32)
Calcium: 9.7 mg/dL (ref 8.4–10.5)
Creatinine, Ser: 0.88 mg/dL (ref 0.40–1.50)
GFR: 93.24 mL/min (ref >60.00)
GLUCOSE: 103 mg/dL — AB (ref 70–99)
POTASSIUM: 4.5 meq/L (ref 3.5–5.1)
SODIUM: 140 meq/L (ref 135–145)

## 2016-03-29 LAB — HEMOGLOBIN A1C: HEMOGLOBIN A1C: 6.1 % (ref 4.6–6.5)

## 2016-03-29 LAB — MICROALBUMIN / CREATININE URINE RATIO
CREATININE, U: 158.2 mg/dL
MICROALB/CREAT RATIO: 0.4 mg/g (ref 0.0–30.0)
Microalb, Ur: 0.7 mg/dL (ref 0.0–1.9)

## 2016-03-29 NOTE — Assessment & Plan Note (Signed)
On Torsemide, Hydralazine Refusing CPAP NAS diet Needs O2 2 l/min [place]

## 2016-03-29 NOTE — Progress Notes (Signed)
Subjective:  Patient ID: [name], male    DOB: 1956-04-07  Age: 59 y.o. MRN: [id number]  CC: No chief complaint on file.   HPI [name] presents for CHF, LE edema, obesity, lymphoma f/u  Outpatient Medications Prior to Visit  Medication Sig Dispense Refill  . albuterol (PROVENTIL) (2.5 MG/3ML) 0.083% nebulizer solution Take 3 mLs (2.5 mg total) by nebulization every 2 (two) hours as needed for wheezing. 75 mL 2  . ANORO ELLIPTA 62.5-25 MCG/INH AEPB Inhale 1 puff into the lungs daily as needed. For wheezing & SOB 60 each 5  . aspirin EC 81 MG tablet Take 81 mg by mouth daily.    . clonazePAM (KLONOPIN) 0.5 MG tablet TAKE 1 TABLET TWICE A DAY AS NEEDED FOR ANXIETY OR AT BEDTME FOR INSOMNIA 180 tablet 1  . clopidogrel (PLAVIX) 75 MG tablet TAKE 1 TABLET DAILY WITH   BREAKFAST 90 tablet 3  . metoprolol succinate (TOPROL-XL) 50 MG 24 hr tablet Take 100 mg by mouth daily.    . nitroGLYCERIN (NITROSTAT) 0.4 MG SL tablet Place 1 tablet (0.4 mg total) under the tongue every 5 (five) minutes as needed for chest pain (Up to 3 doses). 25 tablet 1  . oxyCODONE-acetaminophen (PERCOCET/ROXICET) 5-325 MG tablet Take 1 tablet by mouth every 6 (six) hours as needed for severe pain. 120 tablet 0  . potassium chloride SA (KLOR-CON M20) 20 MEQ tablet Take 1 tablet (20 mEq total) by mouth 2 (two) times daily. 180 tablet 3  . simvastatin (ZOCOR) 20 MG tablet TAKE 1 TABLET AT BEDTIME 90 tablet 3  . torsemide (DEMADEX) 100 MG tablet Take 1 tablet (100 mg total) by mouth 2 (two) times daily. 180 tablet 3   No facility-administered medications prior to visit.     ROS Review of Systems  Constitutional: Negative for appetite change, fatigue and unexpected weight change.  HENT: Negative for congestion, nosebleeds, sneezing, sore throat and trouble swallowing.   Eyes: Negative for itching and visual disturbance.  Respiratory: Negative for cough.   Cardiovascular: Positive for leg swelling. Negative  for chest pain and palpitations.  Gastrointestinal: Negative for abdominal distention, blood in stool, diarrhea and nausea.  Genitourinary: Negative for frequency and hematuria.  Musculoskeletal: Negative for back pain, gait problem, joint swelling and neck pain.  Skin: Negative for rash.  Neurological: Negative for dizziness, tremors, speech difficulty and weakness.  Psychiatric/Behavioral: Negative for agitation, dysphoric mood and sleep disturbance. The patient is nervous/anxious.     Objective:  BP 138/68   Pulse 68   Wt (!) 353 lb (160.1 kg)   SpO2 93%   BMI 47.88 kg/m   BP Readings from Last 3 Encounters:  03/29/16 138/68  02/25/16 (!) 138/55  02/25/16 (!) 158/61    Wt Readings from Last 3 Encounters:  03/29/16 (!) 353 lb (160.1 kg)  02/25/16 (!) 353 lb 6.4 oz (160.3 kg)  02/23/16 (!) 351 lb (159.2 kg)    Physical Exam  Constitutional: He is oriented to person, place, and time. He appears well-developed. No distress.  NAD  HENT:  Mouth/Throat: Oropharynx is clear and moist.  Eyes: Conjunctivae are normal. Pupils are equal, round, and reactive to light.  Neck: Normal range of motion. No JVD present. No thyromegaly present.  Cardiovascular: Normal rate, regular rhythm, normal heart sounds and intact distal pulses.  Exam reveals no gallop and no friction rub.   No murmur heard. Pulmonary/Chest: Effort normal and breath sounds normal. No respiratory distress. He  has no wheezes. He has no rales. He exhibits no tenderness.  Abdominal: Soft. Bowel sounds are normal. He exhibits no distension and no mass. There is no tenderness. There is no rebound and no guarding.  Musculoskeletal: Normal range of motion. He exhibits edema. He exhibits no tenderness.  Lymphadenopathy:    He has no cervical adenopathy.  Neurological: He is alert and oriented to person, place, and time. He has normal reflexes. No cranial nerve deficit. He exhibits normal muscle tone. He displays a negative  Romberg sign. Coordination and gait normal.  Skin: Skin is warm and dry. No rash noted.  Psychiatric: He has a normal mood and affect. His behavior is normal. Judgment and thought content normal.  Obese Indurated skin on B ankles   Lab Results  Component Value Date   WBC 9.9 02/25/2016   HGB 15.4 02/25/2016   HCT 46.2 02/25/2016   PLT 116 (L) 02/25/2016   GLUCOSE 103 02/25/2016   CHOL 131 03/25/2014   TRIG 185.0 (H) 03/25/2014   HDL 24.00 (L) 03/25/2014   LDLCALC 70 03/25/2014   ALT 25 02/25/2016   AST 20 02/25/2016   NA 140 02/25/2016   K 4.1 02/25/2016   CL 97 09/28/2015   CREATININE 0.8 02/25/2016   BUN 10.6 02/25/2016   CO2 29 02/25/2016   TSH 2.01 02/25/2013   PSA 0.23 09/15/2011   INR 1.00 12/28/2014   HGBA1C 6.4 09/28/2015    No results found.  Assessment & Plan:   There are no diagnoses linked to this encounter. I am having [name] maintain his aspirin EC, metoprolol succinate, simvastatin, clopidogrel, ANORO ELLIPTA, albuterol, clonazePAM, oxyCODONE-acetaminophen, nitroGLYCERIN, potassium chloride SA, and torsemide.  No orders of the defined types were placed in this encounter.    Follow-up: No Follow-up on file.  Walker Kehr, MD

## 2016-03-29 NOTE — Assessment & Plan Note (Signed)
  On diet  

## 2016-03-29 NOTE — Progress Notes (Signed)
Pre visit review using our clinic review tool, if applicable. No additional management support is needed unless otherwise documented below in the visit note. 

## 2016-03-29 NOTE — Assessment & Plan Note (Signed)
Better  

## 2016-03-29 NOTE — Assessment & Plan Note (Signed)
Percocet prn  Potential benefits of a long term opioids use as well as potential risks (i.e. addiction risk, apnea etc) and complications (i.e. Somnolence, constipation and others) were explained to the patient and were aknowledged.  

## 2016-03-29 NOTE — Assessment & Plan Note (Signed)
F/u w/Dr Gorsuch 

## 2016-03-29 NOTE — Assessment & Plan Note (Signed)
labs

## 2016-04-14 ENCOUNTER — Other Ambulatory Visit: Payer: Self-pay | Admitting: Internal Medicine

## 2016-04-15 DIAGNOSIS — J449 Chronic obstructive pulmonary disease, unspecified: Secondary | ICD-10-CM | POA: Diagnosis not present

## 2016-04-28 ENCOUNTER — Telehealth: Payer: Self-pay | Admitting: Hematology and Oncology

## 2016-04-28 ENCOUNTER — Encounter: Payer: Self-pay | Admitting: Hematology and Oncology

## 2016-04-28 ENCOUNTER — Ambulatory Visit: Payer: BLUE CROSS/BLUE SHIELD

## 2016-04-28 ENCOUNTER — Other Ambulatory Visit (HOSPITAL_BASED_OUTPATIENT_CLINIC_OR_DEPARTMENT_OTHER): Payer: BLUE CROSS/BLUE SHIELD

## 2016-04-28 ENCOUNTER — Ambulatory Visit (HOSPITAL_BASED_OUTPATIENT_CLINIC_OR_DEPARTMENT_OTHER): Payer: BLUE CROSS/BLUE SHIELD | Admitting: Hematology and Oncology

## 2016-04-28 ENCOUNTER — Ambulatory Visit (HOSPITAL_BASED_OUTPATIENT_CLINIC_OR_DEPARTMENT_OTHER): Payer: BLUE CROSS/BLUE SHIELD

## 2016-04-28 VITALS — BP 120/81 | HR 76 | Temp 97.5°F | Resp 18

## 2016-04-28 DIAGNOSIS — Z720 Tobacco use: Secondary | ICD-10-CM

## 2016-04-28 DIAGNOSIS — C8238 Follicular lymphoma grade IIIa, lymph nodes of multiple sites: Secondary | ICD-10-CM | POA: Diagnosis not present

## 2016-04-28 DIAGNOSIS — D696 Thrombocytopenia, unspecified: Secondary | ICD-10-CM | POA: Diagnosis not present

## 2016-04-28 DIAGNOSIS — R609 Edema, unspecified: Secondary | ICD-10-CM

## 2016-04-28 DIAGNOSIS — J449 Chronic obstructive pulmonary disease, unspecified: Secondary | ICD-10-CM

## 2016-04-28 DIAGNOSIS — C8228 Follicular lymphoma grade III, unspecified, lymph nodes of multiple sites: Secondary | ICD-10-CM

## 2016-04-28 DIAGNOSIS — Z5112 Encounter for antineoplastic immunotherapy: Secondary | ICD-10-CM | POA: Diagnosis not present

## 2016-04-28 DIAGNOSIS — L989 Disorder of the skin and subcutaneous tissue, unspecified: Secondary | ICD-10-CM | POA: Insufficient documentation

## 2016-04-28 DIAGNOSIS — I5032 Chronic diastolic (congestive) heart failure: Secondary | ICD-10-CM

## 2016-04-28 LAB — COMPREHENSIVE METABOLIC PANEL
ALT: 31 U/L (ref 0–55)
AST: 19 U/L (ref 5–34)
Albumin: 3.7 g/dL (ref 3.5–5.0)
Alkaline Phosphatase: 89 U/L (ref 40–150)
Anion Gap: 9 mEq/L (ref 3–11)
BUN: 11.8 mg/dL (ref 7.0–26.0)
CALCIUM: 9.2 mg/dL (ref 8.4–10.4)
CHLORIDE: 100 meq/L (ref 98–109)
CO2: 30 mEq/L — ABNORMAL HIGH (ref 22–29)
Creatinine: 0.7 mg/dL (ref 0.7–1.3)
Glucose: 114 mg/dl (ref 70–140)
POTASSIUM: 4 meq/L (ref 3.5–5.1)
Sodium: 140 mEq/L (ref 136–145)
Total Bilirubin: 0.48 mg/dL (ref 0.20–1.20)
Total Protein: 6.8 g/dL (ref 6.4–8.3)

## 2016-04-28 LAB — CBC WITH DIFFERENTIAL/PLATELET
BASO%: 0.8 % (ref 0.0–2.0)
BASOS ABS: 0.1 10*3/uL (ref 0.0–0.1)
EOS%: 3.4 % (ref 0.0–7.0)
Eosinophils Absolute: 0.3 10*3/uL (ref 0.0–0.5)
HEMATOCRIT: 45.3 % (ref 38.4–49.9)
HGB: 15.1 g/dL (ref 13.0–17.1)
LYMPH#: 0.9 10*3/uL (ref 0.9–3.3)
LYMPH%: 10.1 % — ABNORMAL LOW (ref 14.0–49.0)
MCH: 32.6 pg (ref 27.2–33.4)
MCHC: 33.3 g/dL (ref 32.0–36.0)
MCV: 97.8 fL (ref 79.3–98.0)
MONO#: 0.9 10*3/uL (ref 0.1–0.9)
MONO%: 10 % (ref 0.0–14.0)
NEUT#: 6.6 10*3/uL — ABNORMAL HIGH (ref 1.5–6.5)
NEUT%: 75.7 % — AB (ref 39.0–75.0)
Platelets: 120 10*3/uL — ABNORMAL LOW (ref 140–400)
RBC: 4.63 10*6/uL (ref 4.20–5.82)
RDW: 13.4 % (ref 11.0–14.6)
WBC: 8.8 10*3/uL (ref 4.0–10.3)

## 2016-04-28 MED ORDER — SODIUM CHLORIDE 0.9% FLUSH
10.0000 mL | INTRAVENOUS | Status: DC | PRN
  Administered 2016-04-28: 10 mL
  Filled 2016-04-28: qty 10

## 2016-04-28 MED ORDER — SODIUM CHLORIDE 0.9 % IV SOLN
Freq: Once | INTRAVENOUS | Status: AC
  Administered 2016-04-28: 10:00:00 via INTRAVENOUS

## 2016-04-28 MED ORDER — ANTICOAGULANT SODIUM CITRATE 4% (200MG/5ML) IV SOLN
5.0000 mL | Status: AC | PRN
  Administered 2016-04-28: 5 mL
  Filled 2016-04-28: qty 5

## 2016-04-28 MED ORDER — ACETAMINOPHEN 325 MG PO TABS
ORAL_TABLET | ORAL | Status: AC
  Filled 2016-04-28: qty 2

## 2016-04-28 MED ORDER — DIPHENHYDRAMINE HCL 25 MG PO CAPS
ORAL_CAPSULE | ORAL | Status: AC
  Filled 2016-04-28: qty 2

## 2016-04-28 MED ORDER — ACETAMINOPHEN 325 MG PO TABS
650.0000 mg | ORAL_TABLET | Freq: Once | ORAL | Status: AC
  Administered 2016-04-28: 650 mg via ORAL

## 2016-04-28 MED ORDER — HEPARIN SOD (PORK) LOCK FLUSH 100 UNIT/ML IV SOLN
500.0000 [IU] | Freq: Once | INTRAVENOUS | Status: DC | PRN
  Filled 2016-04-28: qty 5

## 2016-04-28 MED ORDER — DIPHENHYDRAMINE HCL 25 MG PO CAPS
50.0000 mg | ORAL_CAPSULE | Freq: Once | ORAL | Status: AC
  Administered 2016-04-28: 50 mg via ORAL

## 2016-04-28 MED ORDER — SODIUM CHLORIDE 0.9 % IV SOLN
375.0000 mg/m2 | Freq: Once | INTRAVENOUS | Status: AC
  Administered 2016-04-28: 1100 mg via INTRAVENOUS
  Filled 2016-04-28: qty 100

## 2016-04-28 MED ORDER — SODIUM CHLORIDE 0.9 % IJ SOLN
10.0000 mL | INTRAMUSCULAR | Status: AC | PRN
  Administered 2016-04-28: 10 mL
  Filled 2016-04-28: qty 10

## 2016-04-28 NOTE — Assessment & Plan Note (Signed)
This is likely due to recent treatment. The patient denies recent history of bleeding such as epistaxis, hematuria or hematochezia. He is asymptomatic from the low platelet count. I will observe for now.  

## 2016-04-28 NOTE — Assessment & Plan Note (Signed)
The skin lesion looks & feels like a lipoma. I will observe. I reassured him and his wife

## 2016-04-28 NOTE — Telephone Encounter (Signed)
Avs report and appointment schedule given to patient, per 04/28/16 los. °

## 2016-04-28 NOTE — Patient Instructions (Signed)
[facility] Discharge Instructions for Patients Receiving Chemotherapy  Today you received the following chemotherapy agents: Rituxan   To help prevent nausea and vomiting after your treatment, we encourage you to take your nausea medication as directed.    If you develop nausea and vomiting that is not controlled by your nausea medication, call the clinic.   BELOW ARE SYMPTOMS THAT SHOULD BE REPORTED IMMEDIATELY:  *FEVER GREATER THAN 100.5 F  *CHILLS WITH OR WITHOUT FEVER  NAUSEA AND VOMITING THAT IS NOT CONTROLLED WITH YOUR NAUSEA MEDICATION  *UNUSUAL SHORTNESS OF BREATH  *UNUSUAL BRUISING OR BLEEDING  TENDERNESS IN MOUTH AND THROAT WITH OR WITHOUT PRESENCE OF ULCERS  *URINARY PROBLEMS  *BOWEL PROBLEMS  UNUSUAL RASH Items with * indicate a potential emergency and should be followed up as soon as possible.  Feel free to call the clinic you have any questions or concerns. The clinic phone number is [phone].  Please show the CHEMO ALERT CARD at check-in to the Emergency Department and triage nurse.   

## 2016-04-28 NOTE — Assessment & Plan Note (Signed)
The patient have chronic hypoxemia and wears oxygen intermittently. He follows closely with pulmonologist. His respiratory status improved and he will continue medical management I recommend him to quit smoking

## 2016-04-28 NOTE — Assessment & Plan Note (Signed)
The patient has no evidence of CHF exacerbation except for chronic leg swelling Continue medical management I recommend him to quit smoking  

## 2016-04-28 NOTE — Assessment & Plan Note (Signed)
His last PET scan in February 2017 showed complete response to treatment. Previously, he has poor tolerance to chemotherapy with recurrent infection. He is doing well on maintenance rituximab only and he agreed with the plan. I will see him every 8 weeks with history, blood work, physical examination and rituximab, until September 2018 I plan to repeat imaging study again next year

## 2016-04-28 NOTE — Patient Instructions (Signed)

## 2016-04-28 NOTE — Progress Notes (Signed)
[place] OFFICE PROGRESS NOTE  Patient Care Team: [name], MD as PCP - General (Internal Medicine) [name], MD as Consulting Physician (Cardiology) Heath Lark, MD as Consulting Physician (Hematology and Oncology) Jerrell Belfast, MD as Consulting Physician (Otolaryngology) Daryll Brod, MD as Consulting Physician (Orthopedic Surgery)  SUMMARY OF ONCOLOGIC HISTORY: Oncology History   FLIPI score of 2; stage 3 and areas of involvement >4     Follicular lymphoma ([place])   06/24/2009 Imaging    PET CT scan showed two small FDG positive left neck nodes      12/11/2014 Surgery    He underwent excisional lymph node biopsy of the left supraclavicular lymph node/neck region      12/11/2014 Pathology Results    Accession: [id number] biopsy show follicular lymphoma      12/24/2014 Imaging    ECHO showed LVH but preserved EF      12/25/2014 Imaging    PET scan showed disease above and below diaphragm      12/28/2014 Procedure    He has port placement      12/31/2014 - 01/01/2015 Chemotherapy    He received 1 cycle of bendamustine with rituximab, discontinued due to suspicion of allergic reaction to bendamustine      01/12/2015 - 01/19/2015 Hospital Admission    He was admitted to the hospital with suspicious allergic reaction, significant bilateral lower extremity edema with cellulitis, pneumonia and mild fluid overload      03/18/2015 - 04/08/2015 Chemotherapy    Treatment is switched to rituximab, Cytoxan, vincristine and prednisone      04/29/2015 Imaging     PET CT scan showed near complete response to treatment.      04/30/2015 -  Chemotherapy    He received maintenance Rituxan       INTERVAL HISTORY: Please see below for problem oriented charting. He returns for his treatment today. He denies recent COPD exacerbation. He continues to smoke. He has persistent chronic lower extremity edema He is concerned about skin lesions on his back and  at the front of his abdomen close to the umbilicus  REVIEW OF SYSTEMS:   Constitutional: Denies fevers, chills or abnormal weight loss Eyes: Denies blurriness of vision Ears, nose, mouth, throat, and face: Denies mucositis or sore throat Respiratory: Denies cough, dyspnea or wheezes Cardiovascular: Denies palpitation, chest discomfort  Gastrointestinal:  Denies nausea, heartburn or change in bowel habits Skin: Denies abnormal skin rashes Lymphatics: Denies new lymphadenopathy or easy bruising Neurological:Denies numbness, tingling or new weaknesses Behavioral/Psych: Mood is stable, no new changes  All other systems were reviewed with the patient and are negative.  I have reviewed the past medical history, past surgical history, social history and family history with the patient and they are unchanged from previous note.  ALLERGIES:  is allergic to bendamustine hcl; heparin; and tape.  MEDICATIONS:  Current Outpatient Prescriptions  Medication Sig Dispense Refill  . albuterol (PROVENTIL) (2.5 MG/3ML) 0.083% nebulizer solution Take 3 mLs (2.5 mg total) by nebulization every 2 (two) hours as needed for wheezing. 75 mL 2  . ANORO ELLIPTA 62.5-25 MCG/INH AEPB Inhale 1 puff into the lungs daily as needed. For wheezing & SOB 60 each 5  . aspirin EC 81 MG tablet Take 81 mg by mouth daily.    . clonazePAM (KLONOPIN) 0.5 MG tablet TAKE 1 TABLET TWICE A DAY AS NEEDED FOR ANXIETY OR AT BEDTME FOR INSOMNIA 180 tablet 1  . clopidogrel (PLAVIX) 75 MG tablet  TAKE 1 TABLET DAILY WITH   BREAKFAST 90 tablet 3  . metoprolol succinate (TOPROL-XL) 50 MG 24 hr tablet Take 100 mg by mouth daily.    . metoprolol succinate (TOPROL-XL) 50 MG 24 hr tablet TAKE 2 TABLETS (100MG )     DAILY WITH OR IMMEDIATELY  FOLLOWING A MEAL 180 tablet 3  . nitroGLYCERIN (NITROSTAT) 0.4 MG SL tablet Place 1 tablet (0.4 mg total) under the tongue every 5 (five) minutes as needed for chest pain (Up to 3 doses). 25 tablet 1  .  oxyCODONE-acetaminophen (PERCOCET/ROXICET) 5-325 MG tablet Take 1 tablet by mouth every 6 (six) hours as needed for severe pain. 120 tablet 0  . potassium chloride SA (KLOR-CON M20) 20 MEQ tablet Take 1 tablet (20 mEq total) by mouth 2 (two) times daily. 180 tablet 3  . simvastatin (ZOCOR) 20 MG tablet TAKE 1 TABLET AT BEDTIME 90 tablet 3  . torsemide (DEMADEX) 100 MG tablet Take 1 tablet (100 mg total) by mouth 2 (two) times daily. 180 tablet 3   No current facility-administered medications for this visit.    Facility-Administered Medications Ordered in Other Visits  Medication Dose Route Frequency Provider Last Rate Last Dose  . anticoagulant sodium citrate solution 5 mL  5 mL Intracatheter PRN Heath Lark, MD      . heparin lock flush 100 unit/mL  500 Units Intracatheter Once PRN Heath Lark, MD      . sodium chloride flush (NS) 0.9 % injection 10 mL  10 mL Intracatheter PRN Heath Lark, MD        PHYSICAL EXAMINATION: ECOG PERFORMANCE STATUS: 1 - Symptomatic but completely ambulatory  Vitals:   04/28/16 0855  BP: (!) 151/78  Pulse: (!) 54  Resp: 18  Temp: 98.4 F (36.9 C)   Filed Weights   04/28/16 0855  Weight: (!) 354 lb (160.6 kg)    GENERAL:alert, no distress and comfortable.He is morbidly obese SKIN: Noted skin lipoma EYES: normal, Conjunctiva are pink and non-injected, sclera clear OROPHARYNX:no exudate, no erythema and lips, buccal mucosa, and tongue normal  NECK: supple, thyroid normal size, non-tender, without nodularity LYMPH:  no palpable lymphadenopathy in the cervical, axillary or inguinal LUNGS: clear to auscultation and percussion with normal breathing effort HEART: regular rate & rhythm and no murmurs with moderate bilateral lower extremity edema ABDOMEN:abdomen soft, non-tender and normal bowel sounds Musculoskeletal:no cyanosis of digits and no clubbing  NEURO: alert & oriented x 3 with fluent speech, no focal motor/sensory deficits  LABORATORY DATA:  I  have reviewed the data as listed    Component Value Date/Time   NA 140 04/28/2016 0823   K 4.0 04/28/2016 0823   CL 99 03/29/2016 1655   CO2 30 (H) 04/28/2016 0823   GLUCOSE 114 04/28/2016 0823   BUN 11.8 04/28/2016 0823   CREATININE 0.7 04/28/2016 0823   CALCIUM 9.2 04/28/2016 0823   PROT 6.8 04/28/2016 0823   ALBUMIN 3.7 04/28/2016 0823   AST 19 04/28/2016 0823   ALT 31 04/28/2016 0823   ALKPHOS 89 04/28/2016 0823   BILITOT 0.48 04/28/2016 0823   GFRNONAA >60 01/18/2015 0320   GFRAA >60 01/18/2015 0320    No results found for: SPEP, UPEP  Lab Results  Component Value Date   WBC 8.8 04/28/2016   NEUTROABS 6.6 (H) 04/28/2016   HGB 15.1 04/28/2016   HCT 45.3 04/28/2016   MCV 97.8 04/28/2016   PLT 120 (L) 04/28/2016      Chemistry  Component Value Date/Time   NA 140 04/28/2016 0823   K 4.0 04/28/2016 0823   CL 99 03/29/2016 1655   CO2 30 (H) 04/28/2016 0823   BUN 11.8 04/28/2016 0823   CREATININE 0.7 04/28/2016 0823      Component Value Date/Time   CALCIUM 9.2 04/28/2016 0823   ALKPHOS 89 04/28/2016 0823   AST 19 04/28/2016 0823   ALT 31 04/28/2016 0823   BILITOT 0.48 04/28/2016 0823     ASSESSMENT & PLAN:  Follicular lymphoma ([place]) His last PET scan in February 2017 showed complete response to treatment. Previously, he has poor tolerance to chemotherapy with recurrent infection. He is doing well on maintenance rituximab only and he agreed with the plan. I will see him every 8 weeks with history, blood work, physical examination and rituximab, until September 2018 I plan to repeat imaging study again next year  Thrombocytopenia [facility]) This is likely due to recent treatment. The patient denies recent history of bleeding such as epistaxis, hematuria or hematochezia. He is asymptomatic from the low platelet count. I will observe for now.  Tobacco abuse I spent some time counseling the patient the importance of tobacco cessation. He is currently not  interested to quit now.  Chronic diastolic CHF (congestive heart failure) ([facility]) The patient has no evidence of CHF exacerbation except for chronic leg swelling Continue medical management I recommend him to quit smoking   COPD mixed type ([place]) The patient have chronic hypoxemia and wears oxygen intermittently. He follows closely with pulmonologist. His respiratory status improved and he will continue medical management I recommend him to quit smoking  Skin lesion of back The skin lesion looks & feels like a lipoma. I will observe. I reassured him and his wife   No orders of the defined types were placed in this encounter.  All questions were answered. The patient knows to call the clinic with any problems, questions or concerns. No barriers to learning was detected. I spent 20 minutes counseling the patient face to face. The total time spent in the appointment was 25 minutes and more than 50% was on counseling and review of test results     [facility] [place], MD 04/28/2016 11:49 AM

## 2016-04-28 NOTE — Assessment & Plan Note (Signed)
I spent some time counseling the patient the importance of tobacco cessation. He is currently not interested to quit now.  

## 2016-05-07 ENCOUNTER — Other Ambulatory Visit: Payer: Self-pay | Admitting: Internal Medicine

## 2016-05-15 DIAGNOSIS — J449 Chronic obstructive pulmonary disease, unspecified: Secondary | ICD-10-CM | POA: Diagnosis not present

## 2016-06-15 DIAGNOSIS — J449 Chronic obstructive pulmonary disease, unspecified: Secondary | ICD-10-CM | POA: Diagnosis not present

## 2016-06-20 IMAGING — PT NM PET TUM IMG RESTAG (PS) SKULL BASE T - THIGH
7 series · 25 of 25 positions shown · non-contrast
Comparison: 04/29/2015

CLINICAL DATA: Subsequent treatment strategy for follicular
lymphoma.

EXAM:
NUCLEAR MEDICINE PET SKULL BASE TO THIGH
TECHNIQUE: 16.56 mCi F-18 FDG was injected intravenously. Full-ring PET imaging
was performed from the skull base to thigh after the radiotracer. CT
data was obtained and used for attenuation correction and anatomic
localization.
FASTING BLOOD GLUCOSE:  Value: 101 mg/dl

[Series 3: pet sk_thigh ac · axial · 5.0mm · 4.07mm/px · z∈[-953,+35]mm · 5 of 248 slices shown]
[im 1/248]
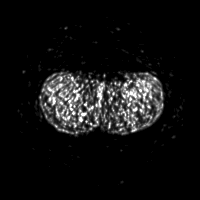
[im 62/248]
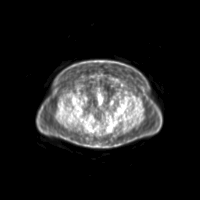
[im 124/248]
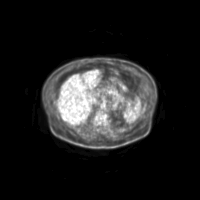
[im 186/248]
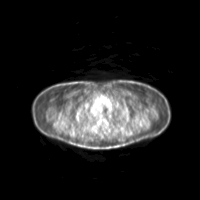
[im 248/248]
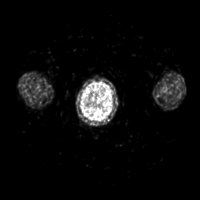

[Series 4: ct sk_thigh 5.0 hd_fov · axial · 5.0mm · 1.17mm/px · z∈[-953,+35]mm · 5 of 245 slices shown]
[im 1/245]
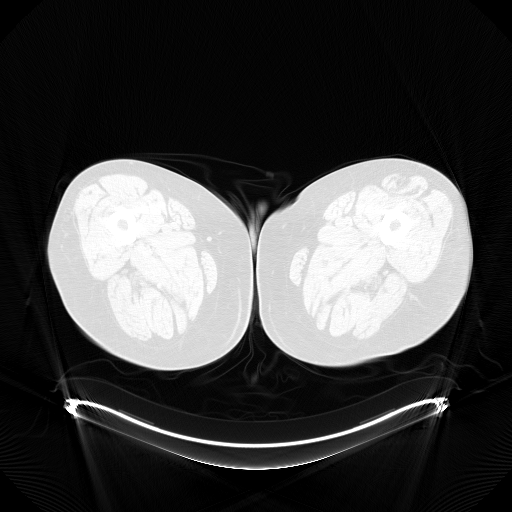
[im 62/245]
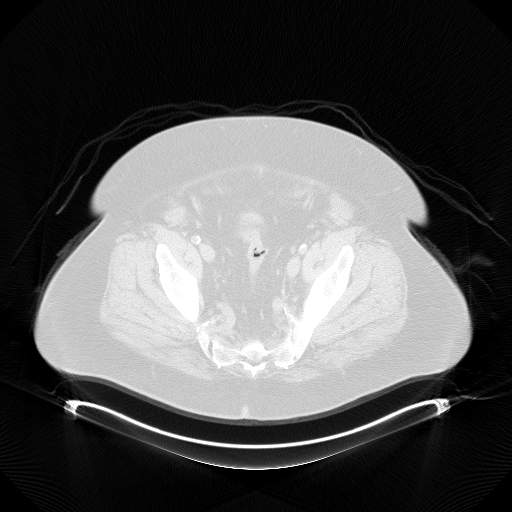
[im 123/245]
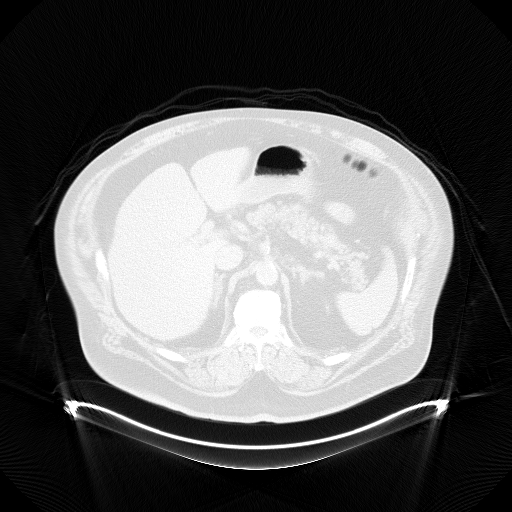
[im 184/245]
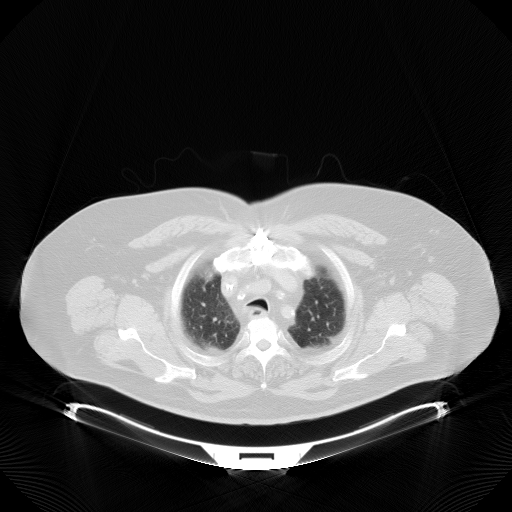
[im 245/245  brain]
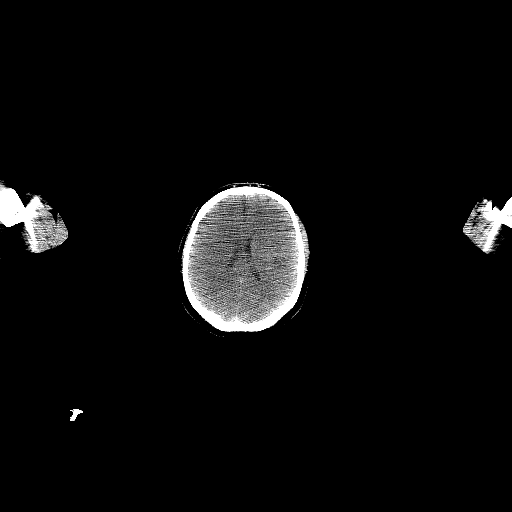

[Series 6: ct sk_thigh 5.0 b70f (id)_bone · axial · 5.0mm · 0.77mm/px · z∈[-475,-155]mm · 2 of 81 slices shown]
[im 1/81  bone]
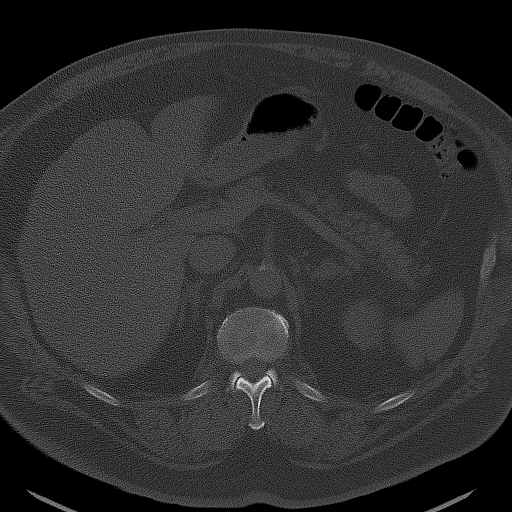
[im 81/81  bone]
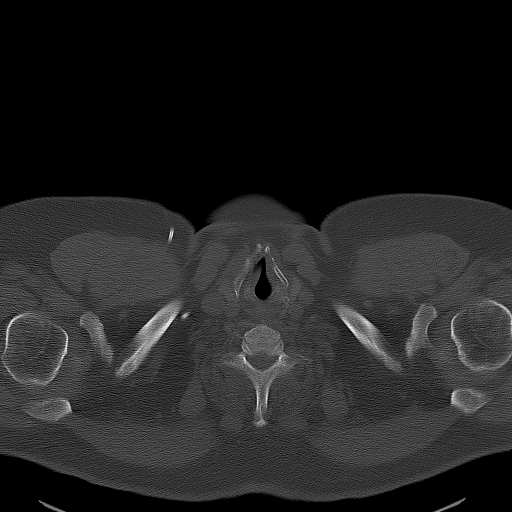

[Series 8: pet sk_thigh nac · axial · 5.0mm · 4.07mm/px · z∈[-953,+35]mm · 5 of 248 slices shown]
[im 1/248]
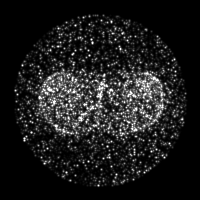
[im 62/248]
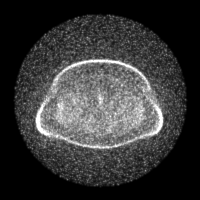
[im 124/248]
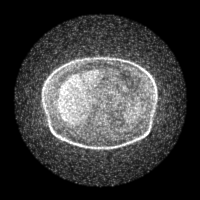
[im 186/248]
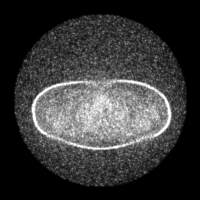
[im 248/248]
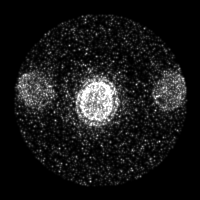

[Series 604: range-ct sk_thigh 5.0 hd_fov-cor-<alpha range> · 2 of 84 slices shown]
[im 1/84]
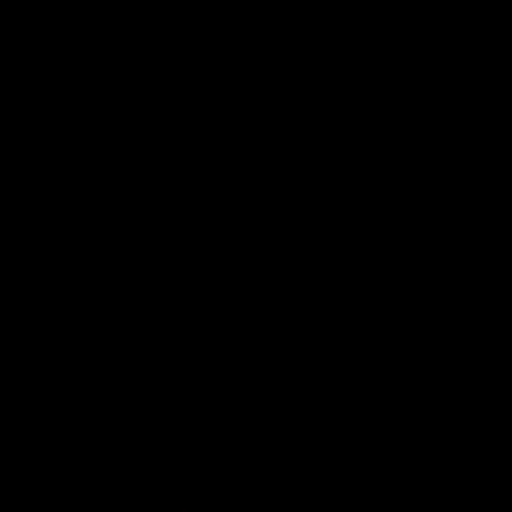
[im 84/84]
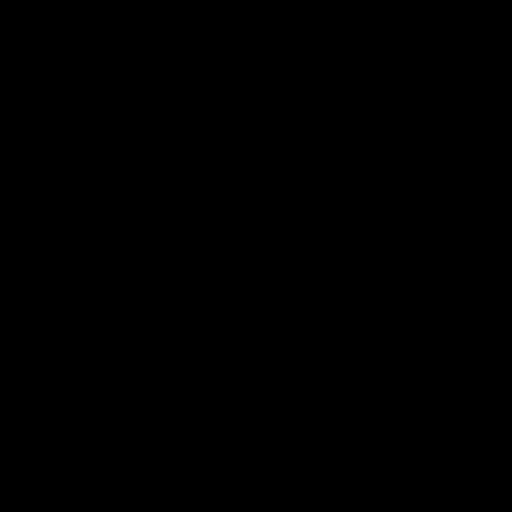

[Series 605: mip collection<mip range> · coronal · 2.05mm/px · 1 of 32 slices shown]
[im 1/32]
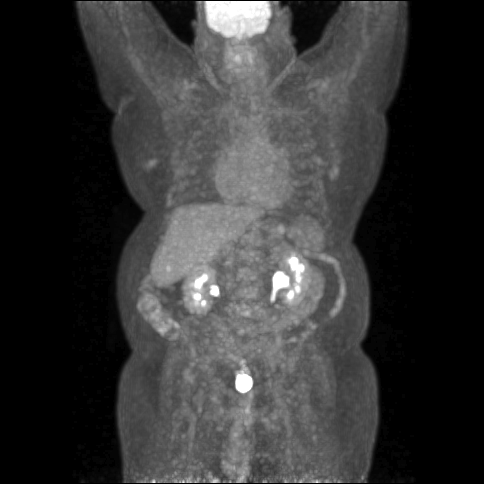

[Series 606: range-ct sk_thigh 5.0 hd_fov-tra-<alpha range> · 5 of 234 slices shown]
[im 1/234]
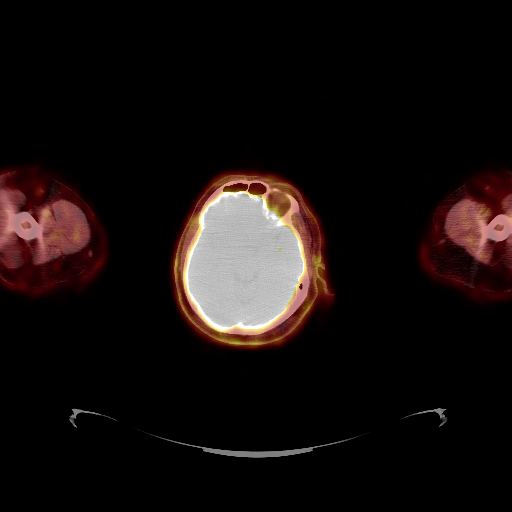
[im 59/234]
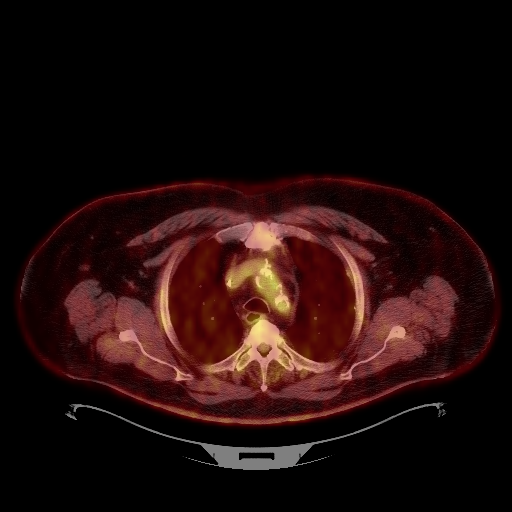
[im 117/234]
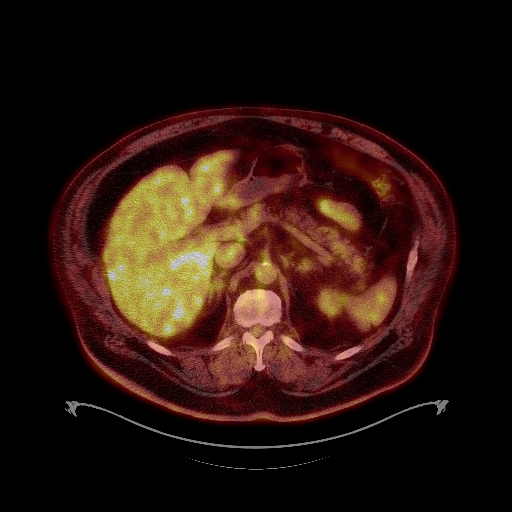
[im 175/234]
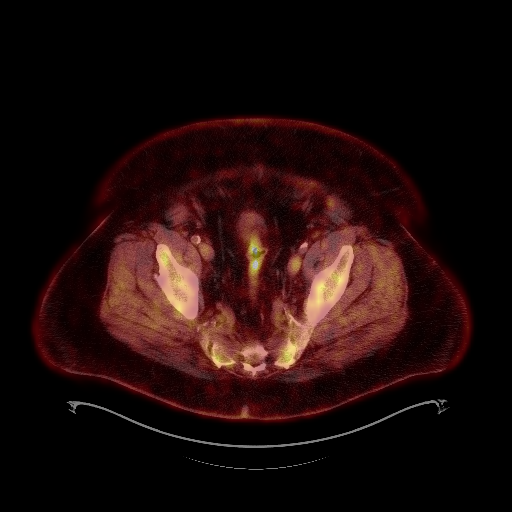
[im 234/234]
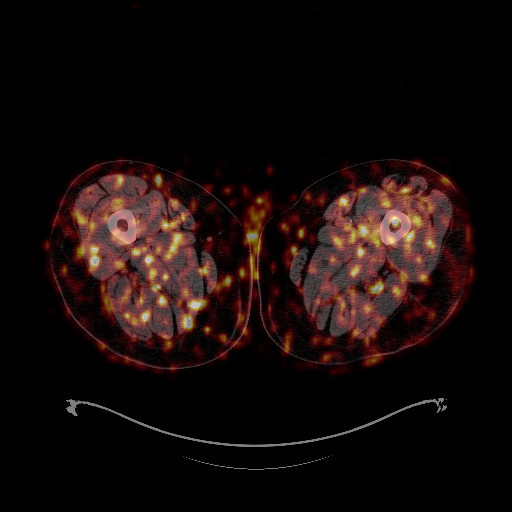

[25 of 25 positions shown; findings below may reference images not displayed]

FINDINGS: NECK

No hypermetabolic lymph nodes in the neck.

CHEST

No suspicious pulmonary nodules. Mild linear scarring/ atelectasis
in the bilateral lower lobes. Calcified pleural plaques. No focal
consolidation. No pleural effusion or pneumothorax.

Mild cardiomegaly. No pericardial effusion. Coronary
atherosclerosis. Postsurgical changes related to prior CABG.
Atherosclerotic calcifications of the aortic arch. Right chest port
terminating in the upper right atrium.

No hypermetabolic thoracic lymphadenopathy.

ABDOMEN/PELVIS

No abnormal hypermetabolic activity within the liver, pancreas,
adrenal glands, or spleen.

Mild hepatic steatosis. Cholelithiasis, without associated
inflammatory changes. Multiple nonobstructing bilateral renal
calculi measuring 4-5 mm. Atherosclerotic calcifications the
abdominal aorta and branch vessels. Bladder is mildly thick-walled
although underdistended.

No hypermetabolic lymph nodes in the abdomen or pelvis. Residual 10
mm short axis right external iliac node (series 4/image 192), non
FDG avid.

SKELETON

No focal hypermetabolic activity to suggest skeletal metastasis.
IMPRESSION: Complete metabolic response. No findings suspicious for
residual/recurrent lymphoma.

10 mm short axis right external iliac lymph node, non FDG avid.

Additional stable ancillary findings as above.

## 2016-06-23 ENCOUNTER — Ambulatory Visit (HOSPITAL_BASED_OUTPATIENT_CLINIC_OR_DEPARTMENT_OTHER): Payer: BLUE CROSS/BLUE SHIELD | Admitting: Hematology and Oncology

## 2016-06-23 ENCOUNTER — Other Ambulatory Visit (HOSPITAL_BASED_OUTPATIENT_CLINIC_OR_DEPARTMENT_OTHER): Payer: BLUE CROSS/BLUE SHIELD

## 2016-06-23 ENCOUNTER — Ambulatory Visit: Payer: BLUE CROSS/BLUE SHIELD

## 2016-06-23 ENCOUNTER — Encounter: Payer: Self-pay | Admitting: Hematology and Oncology

## 2016-06-23 ENCOUNTER — Telehealth: Payer: Self-pay | Admitting: *Deleted

## 2016-06-23 ENCOUNTER — Ambulatory Visit (HOSPITAL_BASED_OUTPATIENT_CLINIC_OR_DEPARTMENT_OTHER): Payer: BLUE CROSS/BLUE SHIELD

## 2016-06-23 ENCOUNTER — Telehealth: Payer: Self-pay | Admitting: Hematology and Oncology

## 2016-06-23 VITALS — BP 145/50 | HR 53 | Temp 98.3°F | Resp 17

## 2016-06-23 DIAGNOSIS — C8228 Follicular lymphoma grade III, unspecified, lymph nodes of multiple sites: Secondary | ICD-10-CM

## 2016-06-23 DIAGNOSIS — C8238 Follicular lymphoma grade IIIa, lymph nodes of multiple sites: Secondary | ICD-10-CM

## 2016-06-23 DIAGNOSIS — I5032 Chronic diastolic (congestive) heart failure: Secondary | ICD-10-CM | POA: Diagnosis not present

## 2016-06-23 DIAGNOSIS — Z23 Encounter for immunization: Secondary | ICD-10-CM | POA: Diagnosis not present

## 2016-06-23 DIAGNOSIS — Z720 Tobacco use: Secondary | ICD-10-CM | POA: Diagnosis not present

## 2016-06-23 DIAGNOSIS — Z5112 Encounter for antineoplastic immunotherapy: Secondary | ICD-10-CM

## 2016-06-23 LAB — CBC WITH DIFFERENTIAL/PLATELET
BASO%: 0.5 % (ref 0.0–2.0)
Basophils Absolute: 0.1 10*3/uL (ref 0.0–0.1)
EOS%: 2.2 % (ref 0.0–7.0)
Eosinophils Absolute: 0.2 10*3/uL (ref 0.0–0.5)
HEMATOCRIT: 45 % (ref 38.4–49.9)
HGB: 14.8 g/dL (ref 13.0–17.1)
LYMPH#: 0.8 10*3/uL — AB (ref 0.9–3.3)
LYMPH%: 8.1 % — ABNORMAL LOW (ref 14.0–49.0)
MCH: 33.1 pg (ref 27.2–33.4)
MCHC: 32.9 g/dL (ref 32.0–36.0)
MCV: 100.7 fL — ABNORMAL HIGH (ref 79.3–98.0)
MONO#: 1.2 10*3/uL — AB (ref 0.1–0.9)
MONO%: 11.1 % (ref 0.0–14.0)
NEUT%: 78.1 % — ABNORMAL HIGH (ref 39.0–75.0)
NEUTROS ABS: 8.2 10*3/uL — AB (ref 1.5–6.5)
PLATELETS: 129 10*3/uL — AB (ref 140–400)
RBC: 4.47 10*6/uL (ref 4.20–5.82)
RDW: 13.1 % (ref 11.0–14.6)
WBC: 10.4 10*3/uL — AB (ref 4.0–10.3)

## 2016-06-23 LAB — COMPREHENSIVE METABOLIC PANEL
ALT: 27 U/L (ref 0–55)
ANION GAP: 8 meq/L (ref 3–11)
AST: 20 U/L (ref 5–34)
Albumin: 3.8 g/dL (ref 3.5–5.0)
Alkaline Phosphatase: 93 U/L (ref 40–150)
BILIRUBIN TOTAL: 0.36 mg/dL (ref 0.20–1.20)
BUN: 12 mg/dL (ref 7.0–26.0)
CALCIUM: 9.2 mg/dL (ref 8.4–10.4)
CO2: 31 meq/L — AB (ref 22–29)
CREATININE: 0.7 mg/dL (ref 0.7–1.3)
Chloride: 101 mEq/L (ref 98–109)
EGFR: >90 mL/min/{1.73_m2} (ref >90)
Glucose: 96 mg/dl (ref 70–140)
Potassium: 4.3 mEq/L (ref 3.5–5.1)
Sodium: 140 mEq/L (ref 136–145)
TOTAL PROTEIN: 6.9 g/dL (ref 6.4–8.3)

## 2016-06-23 MED ORDER — ANTICOAGULANT SODIUM CITRATE 4% (200MG/5ML) IV SOLN
5.0000 mL | Freq: Once | Status: AC
  Administered 2016-06-23: 5 mL
  Filled 2016-06-23: qty 5

## 2016-06-23 MED ORDER — SODIUM CHLORIDE 0.9 % IJ SOLN
10.0000 mL | INTRAMUSCULAR | Status: AC | PRN
  Administered 2016-06-23: 10 mL
  Filled 2016-06-23: qty 10

## 2016-06-23 MED ORDER — DIPHENHYDRAMINE HCL 25 MG PO CAPS
50.0000 mg | ORAL_CAPSULE | Freq: Once | ORAL | Status: AC
  Administered 2016-06-23: 50 mg via ORAL

## 2016-06-23 MED ORDER — ANTICOAGULANT SODIUM CITRATE 4% (200MG/5ML) IV SOLN
5.0000 mL | Status: AC | PRN
  Administered 2016-06-23: 5 mL
  Filled 2016-06-23: qty 5

## 2016-06-23 MED ORDER — INFLUENZA VAC SPLIT QUAD 0.5 ML IM SUSY
0.5000 mL | PREFILLED_SYRINGE | Freq: Once | INTRAMUSCULAR | Status: AC
  Administered 2016-06-23: 0.5 mL via INTRAMUSCULAR
  Filled 2016-06-23: qty 0.5

## 2016-06-23 MED ORDER — DIPHENHYDRAMINE HCL 25 MG PO CAPS
ORAL_CAPSULE | ORAL | Status: AC
  Filled 2016-06-23: qty 2

## 2016-06-23 MED ORDER — ACETAMINOPHEN 325 MG PO TABS
650.0000 mg | ORAL_TABLET | Freq: Once | ORAL | Status: AC
  Administered 2016-06-23: 650 mg via ORAL

## 2016-06-23 MED ORDER — SODIUM CHLORIDE 0.9 % IV SOLN
Freq: Once | INTRAVENOUS | Status: AC
  Administered 2016-06-23: 11:00:00 via INTRAVENOUS

## 2016-06-23 MED ORDER — SODIUM CHLORIDE 0.9 % IV SOLN
375.0000 mg/m2 | Freq: Once | INTRAVENOUS | Status: AC
  Administered 2016-06-23: 1100 mg via INTRAVENOUS
  Filled 2016-06-23: qty 100

## 2016-06-23 MED ORDER — ACETAMINOPHEN 325 MG PO TABS
ORAL_TABLET | ORAL | Status: AC
  Filled 2016-06-23: qty 2

## 2016-06-23 NOTE — Patient Instructions (Signed)
[facility] Discharge Instructions for Patients Receiving Chemotherapy  Today you received the following chemotherapy agents Rituxan To help prevent nausea and vomiting after your treatment, we encourage you to take your nausea medication as prescribed.  If you develop nausea and vomiting that is not controlled by your nausea medication, call the clinic.   BELOW ARE SYMPTOMS THAT SHOULD BE REPORTED IMMEDIATELY:  *FEVER GREATER THAN 100.5 F  *CHILLS WITH OR WITHOUT FEVER  NAUSEA AND VOMITING THAT IS NOT CONTROLLED WITH YOUR NAUSEA MEDICATION  *UNUSUAL SHORTNESS OF BREATH  *UNUSUAL BRUISING OR BLEEDING  TENDERNESS IN MOUTH AND THROAT WITH OR WITHOUT PRESENCE OF ULCERS  *URINARY PROBLEMS  *BOWEL PROBLEMS  UNUSUAL RASH Items with * indicate a potential emergency and should be followed up as soon as possible.  Feel free to call the clinic you have any questions or concerns. The clinic phone number is [phone].  Please show the CHEMO ALERT CARD at check-in to the Emergency Department and triage nurse.   

## 2016-06-23 NOTE — Assessment & Plan Note (Signed)
I spent some time counseling the patient the importance of tobacco cessation. He is currently not interested to quit now.  

## 2016-06-23 NOTE — Telephone Encounter (Signed)
Per LOS I have scheduled appts and notified the scheuler 

## 2016-06-23 NOTE — Progress Notes (Signed)
[place] OFFICE PROGRESS NOTE  Patient Care Team: [name], MD as PCP - General (Internal Medicine) [name], MD as Consulting Physician (Cardiology) Heath Lark, MD as Consulting Physician (Hematology and Oncology) Jerrell Belfast, MD as Consulting Physician (Otolaryngology) Daryll Brod, MD as Consulting Physician (Orthopedic Surgery)  SUMMARY OF ONCOLOGIC HISTORY: Oncology History   FLIPI score of 2; stage 3 and areas of involvement >4     Follicular lymphoma ([place])   06/24/2009 Imaging    PET CT scan showed two small FDG positive left neck nodes      12/11/2014 Surgery    He underwent excisional lymph node biopsy of the left supraclavicular lymph node/neck region      12/11/2014 Pathology Results    Accession: [id number] biopsy show follicular lymphoma      12/24/2014 Imaging    ECHO showed LVH but preserved EF      12/25/2014 Imaging    PET scan showed disease above and below diaphragm      12/28/2014 Procedure    He has port placement      12/31/2014 - 01/01/2015 Chemotherapy    He received 1 cycle of bendamustine with rituximab, discontinued due to suspicion of allergic reaction to bendamustine      01/12/2015 - 01/19/2015 Hospital Admission    He was admitted to the hospital with suspicious allergic reaction, significant bilateral lower extremity edema with cellulitis, pneumonia and mild fluid overload      03/18/2015 - 04/08/2015 Chemotherapy    Treatment is switched to rituximab, Cytoxan, vincristine and prednisone      04/29/2015 Imaging     PET CT scan showed near complete response to treatment.      04/30/2015 -  Chemotherapy    He received maintenance Rituxan       INTERVAL HISTORY: Please see below for problem oriented charting. He returns for follow-up and for treatment. He denies recent infection. No new lymphadenopathy. The patient continues to smoke, nearly 1 pack of cigarettes per day Denies recent chest pain or  shortness breath  REVIEW OF SYSTEMS:   Constitutional: Denies fevers, chills or abnormal weight loss Eyes: Denies blurriness of vision Ears, nose, mouth, throat, and face: Denies mucositis or sore throat Respiratory: Denies cough, dyspnea or wheezes Cardiovascular: Denies palpitation, chest discomfort  Gastrointestinal:  Denies nausea, heartburn or change in bowel habits Skin: Denies abnormal skin rashes Lymphatics: Denies new lymphadenopathy or easy bruising Neurological:Denies numbness, tingling or new weaknesses Behavioral/Psych: Mood is stable, no new changes  All other systems were reviewed with the patient and are negative.  I have reviewed the past medical history, past surgical history, social history and family history with the patient and they are unchanged from previous note.  ALLERGIES:  is allergic to bendamustine hcl; heparin; and tape.  MEDICATIONS:  Current Outpatient Prescriptions  Medication Sig Dispense Refill  . albuterol (PROVENTIL) (2.5 MG/3ML) 0.083% nebulizer solution Take 3 mLs (2.5 mg total) by nebulization every 2 (two) hours as needed for wheezing. 75 mL 2  . ANORO ELLIPTA 62.5-25 MCG/INH AEPB Inhale 1 puff into the lungs daily as needed. For wheezing & SOB 60 each 5  . aspirin EC 81 MG tablet Take 81 mg by mouth daily.    . clonazePAM (KLONOPIN) 0.5 MG tablet TAKE 1 TABLET TWICE A DAY AS NEEDED FOR ANXIETY OR AT BEDTME FOR INSOMNIA 180 tablet 1  . clopidogrel (PLAVIX) 75 MG tablet TAKE 1 TABLET DAILY WITH   BREAKFAST 90  tablet 3  . KLOR-CON M20 20 MEQ tablet TAKE 1 TABLET DAILY 90 tablet 1  . metoprolol succinate (TOPROL-XL) 50 MG 24 hr tablet Take 100 mg by mouth daily.    . metoprolol succinate (TOPROL-XL) 50 MG 24 hr tablet TAKE 2 TABLETS (100MG )     DAILY WITH OR IMMEDIATELY  FOLLOWING A MEAL 180 tablet 3  . nitroGLYCERIN (NITROSTAT) 0.4 MG SL tablet Place 1 tablet (0.4 mg total) under the tongue every 5 (five) minutes as needed for chest pain (Up to 3  doses). 25 tablet 1  . oxyCODONE-acetaminophen (PERCOCET/ROXICET) 5-325 MG tablet Take 1 tablet by mouth every 6 (six) hours as needed for severe pain. 120 tablet 0  . potassium chloride SA (KLOR-CON M20) 20 MEQ tablet Take 1 tablet (20 mEq total) by mouth 2 (two) times daily. 180 tablet 3  . simvastatin (ZOCOR) 20 MG tablet TAKE 1 TABLET AT BEDTIME 90 tablet 3  . torsemide (DEMADEX) 100 MG tablet Take 1 tablet (100 mg total) by mouth 2 (two) times daily. 180 tablet 3   No current facility-administered medications for this visit.     PHYSICAL EXAMINATION: ECOG PERFORMANCE STATUS: 0 - Asymptomatic  Vitals:   06/23/16 1012  BP: (!) 146/66  Pulse: 64  Resp: 18  Temp: 98.1 F (36.7 C)   Filed Weights   06/23/16 1012  Weight: (!) 355 lb 9.6 oz (161.3 kg)    GENERAL:alert, no distress and comfortable. He is morbidly obese SKIN: skin color, texture, turgor are normal, no rashes or significant lesions EYES: normal, Conjunctiva are pink and non-injected, sclera clear OROPHARYNX:no exudate, no erythema and lips, buccal mucosa, and tongue normal  NECK: supple, thyroid normal size, non-tender, without nodularity LYMPH:  no palpable lymphadenopathy in the cervical, axillary or inguinal LUNGS: clear to auscultation and percussion with normal breathing effort HEART: regular rate & rhythm and no murmurs with moderate bilateral lower extremity edema ABDOMEN:abdomen soft, non-tender and normal bowel sounds Musculoskeletal:no cyanosis of digits and no clubbing  NEURO: alert & oriented x 3 with fluent speech, no focal motor/sensory deficits  LABORATORY DATA:  I have reviewed the data as listed    Component Value Date/Time   NA 140 04/28/2016 0823   K 4.0 04/28/2016 0823   CL 99 03/29/2016 1655   CO2 30 (H) 04/28/2016 0823   GLUCOSE 114 04/28/2016 0823   BUN 11.8 04/28/2016 0823   CREATININE 0.7 04/28/2016 0823   CALCIUM 9.2 04/28/2016 0823   PROT 6.8 04/28/2016 0823   ALBUMIN 3.7  04/28/2016 0823   AST 19 04/28/2016 0823   ALT 31 04/28/2016 0823   ALKPHOS 89 04/28/2016 0823   BILITOT 0.48 04/28/2016 0823   GFRNONAA >60 01/18/2015 0320   GFRAA >60 01/18/2015 0320    No results found for: SPEP, UPEP  Lab Results  Component Value Date   WBC 10.4 (H) 06/23/2016   NEUTROABS 8.2 (H) 06/23/2016   HGB 14.8 06/23/2016   HCT 45.0 06/23/2016   MCV 100.7 (H) 06/23/2016   PLT 129 (L) 06/23/2016      Chemistry      Component Value Date/Time   NA 140 04/28/2016 0823   K 4.0 04/28/2016 0823   CL 99 03/29/2016 1655   CO2 30 (H) 04/28/2016 0823   BUN 11.8 04/28/2016 0823   CREATININE 0.7 04/28/2016 0823      Component Value Date/Time   CALCIUM 9.2 04/28/2016 0823   ALKPHOS 89 04/28/2016 0823   AST 19 04/28/2016  0823   ALT 31 04/28/2016 0823   BILITOT 0.48 04/28/2016 0823      ASSESSMENT & PLAN:  Follicular lymphoma ([place]) His last PET scan in February 2017 showed complete response to treatment. Previously, he has poor tolerance to chemotherapy with recurrent infection. He is doing well on maintenance rituximab only and he agreed with the plan. I will see him every 8 weeks with history, blood work, physical examination and rituximab, until September 2018 I plan to repeat imaging study again next year around February [id number]  Chronic diastolic CHF (congestive heart failure) ([place]) The patient has no evidence of CHF exacerbation except for chronic leg swelling Continue medical management I recommend him to quit smoking   Tobacco abuse I spent some time counseling the patient the importance of tobacco cessation. He is currently not interested to quit now.   No orders of the defined types were placed in this encounter.  All questions were answered. The patient knows to call the clinic with any problems, questions or concerns. No barriers to learning was detected. I spent 15 minutes counseling the patient face to face. The total time spent in the appointment  was 20 minutes and more than 50% was on counseling and review of test results     Heath Lark, MD 06/23/2016 10:29 AM

## 2016-06-23 NOTE — Assessment & Plan Note (Signed)
The patient has no evidence of CHF exacerbation except for chronic leg swelling Continue medical management I recommend him to quit smoking  

## 2016-06-23 NOTE — Assessment & Plan Note (Signed)
His last PET scan in February 2017 showed complete response to treatment. Previously, he has poor tolerance to chemotherapy with recurrent infection. He is doing well on maintenance rituximab only and he agreed with the plan. I will see him every 8 weeks with history, blood work, physical examination and rituximab, until September 2018 I plan to repeat imaging study again next year around February 2018

## 2016-06-23 NOTE — Telephone Encounter (Signed)
Appointments scheduled per 11/10 LOS. Patient given AVS report and calendars with future scheduled appointments. °

## 2016-06-30 ENCOUNTER — Ambulatory Visit: Payer: BLUE CROSS/BLUE SHIELD | Admitting: Internal Medicine

## 2016-06-30 DIAGNOSIS — Z0289 Encounter for other administrative examinations: Secondary | ICD-10-CM

## 2016-07-15 DIAGNOSIS — J449 Chronic obstructive pulmonary disease, unspecified: Secondary | ICD-10-CM | POA: Diagnosis not present

## 2016-07-20 ENCOUNTER — Telehealth: Payer: Self-pay | Admitting: *Deleted

## 2016-07-20 NOTE — Telephone Encounter (Signed)
Ok both thx

## 2016-07-20 NOTE — Telephone Encounter (Signed)
Rec'd call pt states he has an appt on 12/19, but he is needing a refill on his Oxycodone & Clonazepam because he is out. pls advise...[name]

## 2016-07-21 MED ORDER — CLONAZEPAM 0.5 MG PO TABS: ORAL_TABLET | ORAL | 1 refills | Status: DC

## 2016-07-21 MED ORDER — OXYCODONE-ACETAMINOPHEN 5-325 MG PO TABS: 1.0000 | ORAL_TABLET | Freq: Four times a day (QID) | ORAL | 0 refills | Status: DC | PRN

## 2016-07-21 NOTE — Telephone Encounter (Signed)
Notified pt rx's ready for pick-up../lmb 

## 2016-07-21 NOTE — Telephone Encounter (Signed)
Patient called back.  He is requesting call as soon as scripts are ready for pick up.

## 2016-08-01 ENCOUNTER — Encounter: Payer: Self-pay | Admitting: Internal Medicine

## 2016-08-01 ENCOUNTER — Ambulatory Visit (INDEPENDENT_AMBULATORY_CARE_PROVIDER_SITE_OTHER): Payer: BLUE CROSS/BLUE SHIELD | Admitting: Internal Medicine

## 2016-08-01 DIAGNOSIS — R609 Edema, unspecified: Secondary | ICD-10-CM

## 2016-08-01 DIAGNOSIS — M79604 Pain in right leg: Secondary | ICD-10-CM

## 2016-08-01 DIAGNOSIS — E1151 Type 2 diabetes mellitus with diabetic peripheral angiopathy without gangrene: Secondary | ICD-10-CM

## 2016-08-01 DIAGNOSIS — M79605 Pain in left leg: Secondary | ICD-10-CM

## 2016-08-01 DIAGNOSIS — J449 Chronic obstructive pulmonary disease, unspecified: Secondary | ICD-10-CM

## 2016-08-01 DIAGNOSIS — M545 Low back pain: Secondary | ICD-10-CM

## 2016-08-01 MED ORDER — OXYCODONE-ACETAMINOPHEN 5-325 MG PO TABS: 1.0000 | ORAL_TABLET | Freq: Four times a day (QID) | ORAL | 0 refills | Status: DC | PRN

## 2016-08-01 MED ORDER — ANORO ELLIPTA 62.5-25 MCG/INH IN AEPB: 1.0000 | INHALATION_SPRAY | Freq: Every day | RESPIRATORY_TRACT | 11 refills | Status: DC | PRN

## 2016-08-01 NOTE — Assessment & Plan Note (Signed)
Anoro O2 at night Pls stop smoking

## 2016-08-01 NOTE — Assessment & Plan Note (Signed)
Pls loose wt

## 2016-08-01 NOTE — Assessment & Plan Note (Signed)
Percocet prn  Potential benefits of a long term opioids use as well as potential risks (i.e. addiction risk, apnea etc) and complications (i.e. Somnolence, constipation and others) were explained to the patient and were aknowledged.  

## 2016-08-01 NOTE — Assessment & Plan Note (Signed)
  On diet  

## 2016-08-01 NOTE — Assessment & Plan Note (Signed)
Loose wt 

## 2016-08-01 NOTE — Progress Notes (Signed)
Pre visit review using our clinic review tool, if applicable. No additional management support is needed unless otherwise documented below in the visit note. 

## 2016-08-01 NOTE — Progress Notes (Signed)
Subjective:  Patient ID: [name], male    DOB: 02-12-1956  Age: 60 y.o. MRN: [id number]  CC: No chief complaint on file.   HPI [name] presents for LBP/OA, CHF, COPD f/u  Outpatient Medications Prior to Visit  Medication Sig Dispense Refill  . albuterol (PROVENTIL) (2.5 MG/3ML) 0.083% nebulizer solution Take 3 mLs (2.5 mg total) by nebulization every 2 (two) hours as needed for wheezing. 75 mL 2  . ANORO ELLIPTA 62.5-25 MCG/INH AEPB Inhale 1 puff into the lungs daily as needed. For wheezing & SOB 60 each 5  . aspirin EC 81 MG tablet Take 81 mg by mouth daily.    . clonazePAM (KLONOPIN) 0.5 MG tablet TAKE 1 TABLET TWICE A DAY AS NEEDED FOR ANXIETY OR AT BEDTME FOR INSOMNIA 180 tablet 1  . clopidogrel (PLAVIX) 75 MG tablet TAKE 1 TABLET DAILY WITH   BREAKFAST 90 tablet 3  . KLOR-CON M20 20 MEQ tablet TAKE 1 TABLET DAILY 90 tablet 1  . metoprolol succinate (TOPROL-XL) 50 MG 24 hr tablet Take 100 mg by mouth daily.    . metoprolol succinate (TOPROL-XL) 50 MG 24 hr tablet TAKE 2 TABLETS (100MG )     DAILY WITH OR IMMEDIATELY  FOLLOWING A MEAL 180 tablet 3  . nitroGLYCERIN (NITROSTAT) 0.4 MG SL tablet Place 1 tablet (0.4 mg total) under the tongue every 5 (five) minutes as needed for chest pain (Up to 3 doses). 25 tablet 1  . oxyCODONE-acetaminophen (PERCOCET/ROXICET) 5-325 MG tablet Take 1 tablet by mouth every 6 (six) hours as needed for severe pain. 120 tablet 0  . potassium chloride SA (KLOR-CON M20) 20 MEQ tablet Take 1 tablet (20 mEq total) by mouth 2 (two) times daily. 180 tablet 3  . simvastatin (ZOCOR) 20 MG tablet TAKE 1 TABLET AT BEDTIME 90 tablet 3  . torsemide (DEMADEX) 100 MG tablet Take 1 tablet (100 mg total) by mouth 2 (two) times daily. 180 tablet 3   No facility-administered medications prior to visit.     ROS Review of Systems  Constitutional: Negative for appetite change, fatigue and unexpected weight change.  HENT: Negative for congestion, nosebleeds,  sneezing, sore throat and trouble swallowing.   Eyes: Negative for itching and visual disturbance.  Respiratory: Positive for cough, shortness of breath and stridor.   Cardiovascular: Positive for leg swelling. Negative for chest pain and palpitations.  Gastrointestinal: Negative for abdominal distention, blood in stool, diarrhea and nausea.  Genitourinary: Negative for frequency and hematuria.  Musculoskeletal: Positive for arthralgias and back pain. Negative for gait problem, joint swelling and neck pain.  Skin: Negative for rash.  Neurological: Negative for dizziness, tremors, speech difficulty and weakness.  Psychiatric/Behavioral: Negative for agitation, dysphoric mood and sleep disturbance. The patient is not nervous/anxious.     Objective:  BP (!) 148/70   Pulse 68   Temp 99.1 F (37.3 C) (Oral)   Wt (!) 352 lb (159.7 kg)   SpO2 90%   BMI 47.74 kg/m   BP Readings from Last 3 Encounters:  08/01/16 (!) 148/70  06/23/16 (!) 145/50  06/23/16 (!) 146/66    Wt Readings from Last 3 Encounters:  08/01/16 (!) 352 lb (159.7 kg)  06/23/16 (!) 355 lb 9.6 oz (161.3 kg)  04/28/16 (!) 354 lb (160.6 kg)    Physical Exam  Constitutional: He is oriented to person, place, and time. He appears well-developed. No distress.  NAD  HENT:  Mouth/Throat: Oropharynx is clear and moist.  Eyes:  Conjunctivae are normal. Pupils are equal, round, and reactive to light.  Neck: Normal range of motion. No JVD present. No thyromegaly present.  Cardiovascular: Normal rate, regular rhythm, normal heart sounds and intact distal pulses.  Exam reveals no gallop and no friction rub.   No murmur heard. Pulmonary/Chest: Effort normal and breath sounds normal. No respiratory distress. He has no wheezes. He has no rales. He exhibits no tenderness.  Abdominal: Soft. Bowel sounds are normal. He exhibits no distension and no mass. There is no tenderness. There is no rebound and no guarding.  Musculoskeletal:  Normal range of motion. He exhibits edema. He exhibits no tenderness.  Lymphadenopathy:    He has no cervical adenopathy.  Neurological: He is alert and oriented to person, place, and time. He has normal reflexes. No cranial nerve deficit. He exhibits normal muscle tone. He displays a negative Romberg sign. Coordination and gait normal.  Skin: Skin is warm and dry. No rash noted.  Psychiatric: He has a normal mood and affect. His behavior is normal. Judgment and thought content normal.  Edema B ankles 1-2+ LS tender Obese  Lab Results  Component Value Date   WBC 10.4 (H) 06/23/2016   HGB 14.8 06/23/2016   HCT 45.0 06/23/2016   PLT 129 (L) 06/23/2016   GLUCOSE 96 06/23/2016   CHOL 131 03/25/2014   TRIG 185.0 (H) 03/25/2014   HDL 24.00 (L) 03/25/2014   LDLCALC 70 03/25/2014   ALT 27 06/23/2016   AST 20 06/23/2016   NA 140 06/23/2016   K 4.3 06/23/2016   CL 99 03/29/2016   CREATININE 0.7 06/23/2016   BUN 12.0 06/23/2016   CO2 31 (H) 06/23/2016   TSH 2.01 02/25/2013   PSA 0.23 09/15/2011   INR 1.00 12/28/2014   HGBA1C 6.1 03/29/2016   MICROALBUR 0.7 03/29/2016    No results found.  Assessment & Plan:   There are no diagnoses linked to this encounter. I am having Mr. Sircy maintain his aspirin EC, metoprolol succinate, simvastatin, clopidogrel, ANORO ELLIPTA, albuterol, nitroGLYCERIN, potassium chloride SA, torsemide, metoprolol succinate, KLOR-CON M20, oxyCODONE-acetaminophen, and clonazePAM.  No orders of the defined types were placed in this encounter.    Follow-up: No Follow-up on file.  Walker Kehr, MD

## 2016-08-15 DIAGNOSIS — J449 Chronic obstructive pulmonary disease, unspecified: Secondary | ICD-10-CM | POA: Diagnosis not present

## 2016-08-16 ENCOUNTER — Other Ambulatory Visit: Payer: Self-pay | Admitting: Geriatric Medicine

## 2016-08-18 ENCOUNTER — Other Ambulatory Visit: Payer: Self-pay | Admitting: *Deleted

## 2016-08-18 MED ORDER — CLOPIDOGREL BISULFATE 75 MG PO TABS: 75.0000 mg | ORAL_TABLET | Freq: Every day | ORAL | 3 refills | Status: DC

## 2016-08-18 MED ORDER — SIMVASTATIN 20 MG PO TABS: 20.0000 mg | ORAL_TABLET | Freq: Every day | ORAL | 3 refills | Status: DC

## 2016-08-23 ENCOUNTER — Telehealth: Payer: Self-pay | Admitting: Internal Medicine

## 2016-08-23 NOTE — Telephone Encounter (Signed)
Patient requesting call back.  He is requesting a 3 month supply of inhaler to be sent to [facility].  Patient is requesting call back.

## 2016-08-24 MED ORDER — ANORO ELLIPTA 62.5-25 MCG/INH IN AEPB: 1.0000 | INHALATION_SPRAY | Freq: Every day | RESPIRATORY_TRACT | 3 refills | Status: DC | PRN

## 2016-08-24 NOTE — Telephone Encounter (Signed)
Refill sent. See meds. Pt informed. 

## 2016-08-25 ENCOUNTER — Ambulatory Visit: Payer: BLUE CROSS/BLUE SHIELD

## 2016-08-25 ENCOUNTER — Other Ambulatory Visit (HOSPITAL_BASED_OUTPATIENT_CLINIC_OR_DEPARTMENT_OTHER): Payer: BLUE CROSS/BLUE SHIELD

## 2016-08-25 ENCOUNTER — Ambulatory Visit (HOSPITAL_BASED_OUTPATIENT_CLINIC_OR_DEPARTMENT_OTHER): Payer: BLUE CROSS/BLUE SHIELD | Admitting: Hematology and Oncology

## 2016-08-25 ENCOUNTER — Ambulatory Visit (HOSPITAL_BASED_OUTPATIENT_CLINIC_OR_DEPARTMENT_OTHER): Payer: BLUE CROSS/BLUE SHIELD

## 2016-08-25 ENCOUNTER — Telehealth: Payer: Self-pay | Admitting: Hematology and Oncology

## 2016-08-25 ENCOUNTER — Encounter: Payer: Self-pay | Admitting: Hematology and Oncology

## 2016-08-25 VITALS — BP 152/56 | HR 73 | Temp 97.4°F | Resp 16 | Ht 72.0 in | Wt 360.1 lb

## 2016-08-25 VITALS — BP 144/68 | HR 75 | Temp 98.1°F | Resp 20

## 2016-08-25 DIAGNOSIS — C8228 Follicular lymphoma grade III, unspecified, lymph nodes of multiple sites: Secondary | ICD-10-CM

## 2016-08-25 DIAGNOSIS — J449 Chronic obstructive pulmonary disease, unspecified: Secondary | ICD-10-CM

## 2016-08-25 DIAGNOSIS — Z5112 Encounter for antineoplastic immunotherapy: Secondary | ICD-10-CM

## 2016-08-25 DIAGNOSIS — C8238 Follicular lymphoma grade IIIa, lymph nodes of multiple sites: Secondary | ICD-10-CM

## 2016-08-25 DIAGNOSIS — D696 Thrombocytopenia, unspecified: Secondary | ICD-10-CM

## 2016-08-25 DIAGNOSIS — Z720 Tobacco use: Secondary | ICD-10-CM

## 2016-08-25 LAB — CBC WITH DIFFERENTIAL/PLATELET
BASO%: 0.9 % (ref 0.0–2.0)
Basophils Absolute: 0.1 10*3/uL (ref 0.0–0.1)
EOS ABS: 0.3 10*3/uL (ref 0.0–0.5)
EOS%: 3.3 % (ref 0.0–7.0)
HEMATOCRIT: 44.2 % (ref 38.4–49.9)
HEMOGLOBIN: 15.1 g/dL (ref 13.0–17.1)
LYMPH#: 0.8 10*3/uL — AB (ref 0.9–3.3)
LYMPH%: 7.6 % — ABNORMAL LOW (ref 14.0–49.0)
MCH: 33.6 pg — ABNORMAL HIGH (ref 27.2–33.4)
MCHC: 34 g/dL (ref 32.0–36.0)
MCV: 98.7 fL — AB (ref 79.3–98.0)
MONO#: 1 10*3/uL — AB (ref 0.1–0.9)
MONO%: 9.7 % (ref 0.0–14.0)
NEUT%: 78.5 % — ABNORMAL HIGH (ref 39.0–75.0)
NEUTROS ABS: 7.9 10*3/uL — AB (ref 1.5–6.5)
PLATELETS: 123 10*3/uL — AB (ref 140–400)
RBC: 4.48 10*6/uL (ref 4.20–5.82)
RDW: 13.9 % (ref 11.0–14.6)
WBC: 10.1 10*3/uL (ref 4.0–10.3)

## 2016-08-25 LAB — COMPREHENSIVE METABOLIC PANEL
ALBUMIN: 3.9 g/dL (ref 3.5–5.0)
ALK PHOS: 82 U/L (ref 40–150)
ALT: 35 U/L (ref 0–55)
ANION GAP: 8 meq/L (ref 3–11)
AST: 26 U/L (ref 5–34)
BILIRUBIN TOTAL: 0.55 mg/dL (ref 0.20–1.20)
BUN: 13.7 mg/dL (ref 7.0–26.0)
CO2: 32 mEq/L — ABNORMAL HIGH (ref 22–29)
Calcium: 9.3 mg/dL (ref 8.4–10.4)
Chloride: 99 mEq/L (ref 98–109)
Creatinine: 0.7 mg/dL (ref 0.7–1.3)
Glucose: 109 mg/dl (ref 70–140)
Potassium: 4.1 mEq/L (ref 3.5–5.1)
Sodium: 139 mEq/L (ref 136–145)
TOTAL PROTEIN: 6.7 g/dL (ref 6.4–8.3)

## 2016-08-25 MED ORDER — SODIUM CHLORIDE 0.9 % IV SOLN
Freq: Once | INTRAVENOUS | Status: AC
  Administered 2016-08-25: 10:00:00 via INTRAVENOUS

## 2016-08-25 MED ORDER — SODIUM CHLORIDE 0.9 % IJ SOLN
10.0000 mL | INTRAMUSCULAR | Status: DC | PRN
  Filled 2016-08-25: qty 10

## 2016-08-25 MED ORDER — ACETAMINOPHEN 325 MG PO TABS
ORAL_TABLET | ORAL | Status: AC
  Filled 2016-08-25: qty 2

## 2016-08-25 MED ORDER — ACETAMINOPHEN 325 MG PO TABS
650.0000 mg | ORAL_TABLET | Freq: Once | ORAL | Status: AC
  Administered 2016-08-25: 650 mg via ORAL

## 2016-08-25 MED ORDER — DIPHENHYDRAMINE HCL 25 MG PO CAPS
50.0000 mg | ORAL_CAPSULE | Freq: Once | ORAL | Status: AC
  Administered 2016-08-25: 50 mg via ORAL

## 2016-08-25 MED ORDER — DIPHENHYDRAMINE HCL 25 MG PO CAPS
ORAL_CAPSULE | ORAL | Status: AC
  Filled 2016-08-25: qty 2

## 2016-08-25 MED ORDER — SODIUM CHLORIDE 0.9% FLUSH
10.0000 mL | INTRAVENOUS | Status: DC | PRN
  Administered 2016-08-25 (×2): 10 mL
  Filled 2016-08-25: qty 10

## 2016-08-25 MED ORDER — SODIUM CHLORIDE 0.9 % IJ SOLN
10.0000 mL | INTRAMUSCULAR | Status: AC | PRN
  Administered 2016-08-25: 10 mL
  Filled 2016-08-25: qty 10

## 2016-08-25 MED ORDER — RITUXIMAB CHEMO INJECTION 500 MG/50ML
375.0000 mg/m2 | Freq: Once | INTRAVENOUS | Status: AC
  Administered 2016-08-25: 1100 mg via INTRAVENOUS
  Filled 2016-08-25: qty 100

## 2016-08-25 MED ORDER — ANTICOAGULANT SODIUM CITRATE 4% (200MG/5ML) IV SOLN
5.0000 mL | Freq: Once | Status: AC
  Administered 2016-08-25: 5 mL
  Filled 2016-08-25: qty 5

## 2016-08-25 NOTE — Assessment & Plan Note (Signed)
I suspect he may have acute COPD exacerbation He follows closely with pulmonologist. I recommend him to quit smoking We discussed potential short-term course of oral steroids but the patient declines

## 2016-08-25 NOTE — Telephone Encounter (Signed)
Gave patient avs report and appointments for March  °

## 2016-08-25 NOTE — Assessment & Plan Note (Signed)
I spent some time counseling the patient the importance of tobacco cessation. He is currently not interested to quit now.  

## 2016-08-25 NOTE — Progress Notes (Signed)
[place] OFFICE PROGRESS NOTE  Patient Care Team: [name], MD as PCP - General (Internal Medicine) [name], MD as Consulting Physician (Cardiology) Heath Lark, MD as Consulting Physician (Hematology and Oncology) Jerrell Belfast, MD as Consulting Physician (Otolaryngology) Daryll Brod, MD as Consulting Physician (Orthopedic Surgery)  SUMMARY OF ONCOLOGIC HISTORY: Oncology History   FLIPI score of 2; stage 3 and areas of involvement >4     Follicular lymphoma ([place])   06/24/2009 Imaging    PET CT scan showed two small FDG positive left neck nodes      12/11/2014 Surgery    He underwent excisional lymph node biopsy of the left supraclavicular lymph node/neck region      12/11/2014 Pathology Results    Accession: [id number] biopsy show follicular lymphoma      12/24/2014 Imaging    ECHO showed LVH but preserved EF      12/25/2014 Imaging    PET scan showed disease above and below diaphragm      12/28/2014 Procedure    He has port placement      12/31/2014 - 01/01/2015 Chemotherapy    He received 1 cycle of bendamustine with rituximab, discontinued due to suspicion of allergic reaction to bendamustine      01/12/2015 - 01/19/2015 Hospital Admission    He was admitted to the hospital with suspicious allergic reaction, significant bilateral lower extremity edema with cellulitis, pneumonia and mild fluid overload      03/18/2015 - 04/08/2015 Chemotherapy    Treatment is switched to rituximab, Cytoxan, vincristine and prednisone      04/29/2015 Imaging     PET CT scan showed near complete response to treatment.      04/30/2015 -  Chemotherapy    He received maintenance Rituxan       INTERVAL HISTORY: Please see below for problem oriented charting. He returns for treatment today He had recent prolonged respiratory tract infection with congestion and cough. He continues to smoke He complained of recent hair loss, body aches and memory  problems and he thought it was related to rituximab He denies recent chest pain He has chronic shortness of breath and leg edema, stable  REVIEW OF SYSTEMS:   Constitutional: Denies fevers, chills or abnormal weight loss Eyes: Denies blurriness of vision Ears, nose, mouth, throat, and face: Denies mucositis or sore throat Gastrointestinal:  Denies nausea, heartburn or change in bowel habits Skin: Denies abnormal skin rashes Lymphatics: Denies new lymphadenopathy or easy bruising Neurological:Denies numbness, tingling or new weaknesses Behavioral/Psych: Mood is stable, no new changes  All other systems were reviewed with the patient and are negative.  I have reviewed the past medical history, past surgical history, social history and family history with the patient and they are unchanged from previous note.  ALLERGIES:  is allergic to bendamustine hcl; heparin; and tape.  MEDICATIONS:  Current Outpatient Prescriptions  Medication Sig Dispense Refill  . albuterol (PROVENTIL) (2.5 MG/3ML) 0.083% nebulizer solution Take 3 mLs (2.5 mg total) by nebulization every 2 (two) hours as needed for wheezing. 75 mL 2  . ANORO ELLIPTA 62.5-25 MCG/INH AEPB Inhale 1 puff into the lungs daily as needed. For wheezing & SOB 180 each 3  . aspirin EC 81 MG tablet Take 81 mg by mouth daily.    . clonazePAM (KLONOPIN) 0.5 MG tablet TAKE 1 TABLET TWICE A DAY AS NEEDED FOR ANXIETY OR AT BEDTME FOR INSOMNIA 180 tablet 1  . clopidogrel (PLAVIX) 75 MG tablet  Take 1 tablet (75 mg total) by mouth daily with breakfast. 90 tablet 3  . KLOR-CON M20 20 MEQ tablet TAKE 1 TABLET DAILY 90 tablet 1  . metoprolol succinate (TOPROL-XL) 50 MG 24 hr tablet TAKE 2 TABLETS (100MG )     DAILY WITH OR IMMEDIATELY  FOLLOWING A MEAL 180 tablet 3  . nitroGLYCERIN (NITROSTAT) 0.4 MG SL tablet Place 1 tablet (0.4 mg total) under the tongue every 5 (five) minutes as needed for chest pain (Up to 3 doses). 25 tablet 1  .  oxyCODONE-acetaminophen (PERCOCET/ROXICET) 5-325 MG tablet Take 1 tablet by mouth every 6 (six) hours as needed for severe pain. 120 tablet 0  . simvastatin (ZOCOR) 20 MG tablet Take 1 tablet (20 mg total) by mouth at bedtime. 90 tablet 3  . torsemide (DEMADEX) 100 MG tablet Take 1 tablet (100 mg total) by mouth 2 (two) times daily. 180 tablet 3   No current facility-administered medications for this visit.    Facility-Administered Medications Ordered in Other Visits  Medication Dose Route Frequency Provider Last Rate Last Dose  . sodium chloride flush (NS) 0.9 % injection 10 mL  10 mL Intracatheter PRN Heath Lark, MD   10 mL at 08/25/16 0840    PHYSICAL EXAMINATION: ECOG PERFORMANCE STATUS: 1 - Symptomatic but completely ambulatory  Vitals:   08/25/16 0907  BP: (!) 152/56  Pulse: 73  Resp: 16  Temp: 97.4 F (36.3 C)   Filed Weights   08/25/16 0907  Weight: (!) 360 lb 1.6 oz (163.3 kg)    GENERAL:alert, no distress and comfortable. He is moderately obese SKIN: skin color, texture, turgor are normal, no rashes or significant lesions EYES: normal, Conjunctiva are pink and non-injected, sclera clear OROPHARYNX:no exudate, no erythema and lips, buccal mucosa, and tongue normal  NECK: supple, thyroid normal size, non-tender, without nodularity LYMPH:  no palpable lymphadenopathy in the cervical, axillary or inguinal LUNGS: Normal breathing effort, diffuse wheezes HEART: regular rate & rhythm and no murmurs with moderate bilateral lower extremity edema ABDOMEN:abdomen soft, non-tender and normal bowel sounds Musculoskeletal:no cyanosis of digits and no clubbing  NEURO: alert & oriented x 3 with fluent speech, no focal motor/sensory deficits  LABORATORY DATA:  I have reviewed the data as listed    Component Value Date/Time   NA 139 08/25/2016 0833   K 4.1 08/25/2016 0833   CL 99 03/29/2016 1655   CO2 32 (H) 08/25/2016 0833   GLUCOSE 109 08/25/2016 0833   BUN 13.7 08/25/2016  0833   CREATININE 0.7 08/25/2016 0833   CALCIUM 9.3 08/25/2016 0833   PROT 6.7 08/25/2016 0833   ALBUMIN 3.9 08/25/2016 0833   AST 26 08/25/2016 0833   ALT 35 08/25/2016 0833   ALKPHOS 82 08/25/2016 0833   BILITOT 0.55 08/25/2016 0833   GFRNONAA >60 01/18/2015 0320   GFRAA >60 01/18/2015 0320    No results found for: SPEP, UPEP  Lab Results  Component Value Date   WBC 10.1 08/25/2016   NEUTROABS 7.9 (H) 08/25/2016   HGB 15.1 08/25/2016   HCT 44.2 08/25/2016   MCV 98.7 (H) 08/25/2016   PLT 123 (L) 08/25/2016      Chemistry      Component Value Date/Time   NA 139 08/25/2016 0833   K 4.1 08/25/2016 0833   CL 99 03/29/2016 1655   CO2 32 (H) 08/25/2016 0833   BUN 13.7 08/25/2016 0833   CREATININE 0.7 08/25/2016 0833      Component Value Date/Time  CALCIUM 9.3 08/25/2016 0833   ALKPHOS 82 08/25/2016 0833   AST 26 08/25/2016 0833   ALT 35 08/25/2016 0833   BILITOT 0.55 08/25/2016 0833      ASSESSMENT & PLAN:  Follicular lymphoma ([place]) His last PET scan in February 2017 showed complete response to treatment. Previously, he has poor tolerance to chemotherapy with recurrent infection. He is doing well on maintenance rituximab only and he agreed with the plan. I will see him every 8 weeks with history, blood work, physical examination and rituximab, until September 2018 I plan to repeat imaging study again around March 2018 I reassured the patient that the rituximab is not the cause of his body aches, hair loss or his memory issues  Tobacco abuse I spent some time counseling the patient the importance of tobacco cessation. He is currently not interested to quit now.  COPD mixed type ([place]) I suspect he may have acute COPD exacerbation He follows closely with pulmonologist. I recommend him to quit smoking We discussed potential short-term course of oral steroids but the patient declines  Thrombocytopenia ([place]) This is likely due to recent treatment. The patient  denies recent history of bleeding such as epistaxis, hematuria or hematochezia. He is asymptomatic from the low platelet count. I will observe for now.   Orders Placed This Encounter  Procedures  . NM PET Image Restag (PS) Skull Base To Thigh    Standing Status:   Future    Standing Expiration Date:   09/29/2017    Order Specific Question:   Reason for exam:    Answer:   staging lymphoma, asssess response to treatment    Order Specific Question:   Preferred imaging location?    Answer:   [facility]   All questions were answered. The patient knows to call the clinic with any problems, questions or concerns. No barriers to learning was detected. I spent 25 minutes counseling the patient face to face. The total time spent in the appointment was 30 minutes and more than 50% was on counseling and review of test results     Heath Lark, MD 08/25/2016 1:00 PM

## 2016-08-25 NOTE — Patient Instructions (Addendum)
[facility] Discharge Instructions for Patients Receiving Chemotherapy  Today you received the following chemotherapy agents Rituxan To help prevent nausea and vomiting after your treatment, we encourage you to take your nausea medication as prescribed.  If you develop nausea and vomiting that is not controlled by your nausea medication, call the clinic.   BELOW ARE SYMPTOMS THAT SHOULD BE REPORTED IMMEDIATELY:  *FEVER GREATER THAN 100.5 F  *CHILLS WITH OR WITHOUT FEVER  NAUSEA AND VOMITING THAT IS NOT CONTROLLED WITH YOUR NAUSEA MEDICATION  *UNUSUAL SHORTNESS OF BREATH  *UNUSUAL BRUISING OR BLEEDING  TENDERNESS IN MOUTH AND THROAT WITH OR WITHOUT PRESENCE OF ULCERS  *URINARY PROBLEMS  *BOWEL PROBLEMS  UNUSUAL RASH Items with * indicate a potential emergency and should be followed up as soon as possible.  Feel free to call the clinic you have any questions or concerns. The clinic phone number is [phone].  Please show the CHEMO ALERT CARD at check-in to the Emergency Department and triage nurse.   

## 2016-08-25 NOTE — Assessment & Plan Note (Signed)
This is likely due to recent treatment. The patient denies recent history of bleeding such as epistaxis, hematuria or hematochezia. He is asymptomatic from the low platelet count. I will observe for now.  

## 2016-08-25 NOTE — Assessment & Plan Note (Addendum)
His last PET scan in February 2017 showed complete response to treatment. Previously, he has poor tolerance to chemotherapy with recurrent infection. He is doing well on maintenance rituximab only and he agreed with the plan. I will see him every 8 weeks with history, blood work, physical examination and rituximab, until September 2018 I plan to repeat imaging study again around March 2018 I reassured the patient that the rituximab is not the cause of his body aches, hair loss or his memory issues

## 2016-08-29 ENCOUNTER — Telehealth: Payer: Self-pay | Admitting: *Deleted

## 2016-08-29 NOTE — Telephone Encounter (Signed)
Pt called to report his wife just diagnosed today by her PCP with the Flu and Pneumonia.  Pt says her MD suggested he call his doctor to get some Antibiotics.  Pt denies any symptoms or flu or pneumonia himself.  States still recovering from respiratory illness/ congestion he has had since before Thanksgiving.  Says he is not feeling any worse than when he saw Dr. Alvy Bimler last week.  He denies any fevers. Denies any worsening coughing or sob.  Notified Dr. Alvy Bimler and she says it is not advised to start antibiotics preventively since the risks outweigh the possible benefit.  Instructed for pt to contact his Pulmonary MD if respiratory symptoms do not improve.  Contact PCP if he develops any fever or signs of flu or call us back.  Dr. Alvy Bimler did offer course of Prednisone last week for his respiratory symptoms and she will prescribe today if pt wants, but she does not think antibiotics are indicated.  Informed Pt of Dr. Calton Dach reply.  He does not think he wants prednisone and he verbalized understanding of instructions.

## 2016-09-15 DIAGNOSIS — J449 Chronic obstructive pulmonary disease, unspecified: Secondary | ICD-10-CM | POA: Diagnosis not present

## 2016-09-19 ENCOUNTER — Telehealth: Payer: Self-pay

## 2016-09-19 NOTE — Telephone Encounter (Signed)
Pt visit 1/12 the f/u disposition is lab/flush/MD and infusion on 3/16. The infusion appt is not yet scheduled. inbasket sent to schedulers.

## 2016-09-25 ENCOUNTER — Ambulatory Visit: Payer: BLUE CROSS/BLUE SHIELD | Admitting: Internal Medicine

## 2016-10-10 ENCOUNTER — Other Ambulatory Visit: Payer: Self-pay

## 2016-10-10 MED ORDER — POTASSIUM CHLORIDE CRYS ER 20 MEQ PO TBCR: 20.0000 meq | EXTENDED_RELEASE_TABLET | Freq: Every day | ORAL | 1 refills | Status: DC

## 2016-10-12 ENCOUNTER — Other Ambulatory Visit: Payer: Self-pay | Admitting: General Practice

## 2016-10-12 MED ORDER — POTASSIUM CHLORIDE CRYS ER 20 MEQ PO TBCR: 20.0000 meq | EXTENDED_RELEASE_TABLET | Freq: Every day | ORAL | 0 refills | Status: DC

## 2016-10-13 DIAGNOSIS — J449 Chronic obstructive pulmonary disease, unspecified: Secondary | ICD-10-CM | POA: Diagnosis not present

## 2016-10-26 ENCOUNTER — Ambulatory Visit (HOSPITAL_COMMUNITY): Payer: BLUE CROSS/BLUE SHIELD

## 2016-10-27 ENCOUNTER — Ambulatory Visit (HOSPITAL_BASED_OUTPATIENT_CLINIC_OR_DEPARTMENT_OTHER): Payer: BLUE CROSS/BLUE SHIELD | Admitting: Hematology and Oncology

## 2016-10-27 ENCOUNTER — Encounter: Payer: Self-pay | Admitting: Hematology and Oncology

## 2016-10-27 ENCOUNTER — Telehealth: Payer: Self-pay | Admitting: Hematology and Oncology

## 2016-10-27 ENCOUNTER — Ambulatory Visit (HOSPITAL_BASED_OUTPATIENT_CLINIC_OR_DEPARTMENT_OTHER): Payer: BLUE CROSS/BLUE SHIELD

## 2016-10-27 ENCOUNTER — Ambulatory Visit: Payer: BLUE CROSS/BLUE SHIELD

## 2016-10-27 VITALS — BP 116/59 | HR 61 | Temp 98.7°F | Resp 17

## 2016-10-27 DIAGNOSIS — C8238 Follicular lymphoma grade IIIa, lymph nodes of multiple sites: Secondary | ICD-10-CM

## 2016-10-27 DIAGNOSIS — I5032 Chronic diastolic (congestive) heart failure: Secondary | ICD-10-CM | POA: Diagnosis not present

## 2016-10-27 DIAGNOSIS — C8228 Follicular lymphoma grade III, unspecified, lymph nodes of multiple sites: Secondary | ICD-10-CM

## 2016-10-27 DIAGNOSIS — Z720 Tobacco use: Secondary | ICD-10-CM | POA: Diagnosis not present

## 2016-10-27 DIAGNOSIS — Z5112 Encounter for antineoplastic immunotherapy: Secondary | ICD-10-CM

## 2016-10-27 LAB — COMPREHENSIVE METABOLIC PANEL
ALT: 32 U/L (ref 0–55)
AST: 22 U/L (ref 5–34)
Albumin: 4 g/dL (ref 3.5–5.0)
Alkaline Phosphatase: 82 U/L (ref 40–150)
Anion Gap: 9 mEq/L (ref 3–11)
BUN: 13.8 mg/dL (ref 7.0–26.0)
CHLORIDE: 100 meq/L (ref 98–109)
CO2: 31 mEq/L — ABNORMAL HIGH (ref 22–29)
Calcium: 9.4 mg/dL (ref 8.4–10.4)
Creatinine: 0.8 mg/dL (ref 0.7–1.3)
GLUCOSE: 118 mg/dL (ref 70–140)
POTASSIUM: 4 meq/L (ref 3.5–5.1)
SODIUM: 139 meq/L (ref 136–145)
Total Bilirubin: 0.43 mg/dL (ref 0.20–1.20)
Total Protein: 6.8 g/dL (ref 6.4–8.3)

## 2016-10-27 LAB — CBC WITH DIFFERENTIAL/PLATELET
BASO%: 0.8 % (ref 0.0–2.0)
BASOS ABS: 0.1 10*3/uL (ref 0.0–0.1)
EOS ABS: 0.3 10*3/uL (ref 0.0–0.5)
EOS%: 3.5 % (ref 0.0–7.0)
HCT: 45.3 % (ref 38.4–49.9)
HGB: 15.3 g/dL (ref 13.0–17.1)
LYMPH#: 0.8 10*3/uL — AB (ref 0.9–3.3)
LYMPH%: 8.1 % — ABNORMAL LOW (ref 14.0–49.0)
MCH: 33.2 pg (ref 27.2–33.4)
MCHC: 33.8 g/dL (ref 32.0–36.0)
MCV: 98.1 fL — AB (ref 79.3–98.0)
MONO#: 1 10*3/uL — ABNORMAL HIGH (ref 0.1–0.9)
MONO%: 9.9 % (ref 0.0–14.0)
NEUT#: 7.7 10*3/uL — ABNORMAL HIGH (ref 1.5–6.5)
NEUT%: 77.7 % — AB (ref 39.0–75.0)
Platelets: 138 10*3/uL — ABNORMAL LOW (ref 140–400)
RBC: 4.61 10*6/uL (ref 4.20–5.82)
RDW: 13.4 % (ref 11.0–14.6)
WBC: 9.9 10*3/uL (ref 4.0–10.3)

## 2016-10-27 MED ORDER — DIPHENHYDRAMINE HCL 25 MG PO CAPS
50.0000 mg | ORAL_CAPSULE | Freq: Once | ORAL | Status: AC
  Administered 2016-10-27: 50 mg via ORAL

## 2016-10-27 MED ORDER — ACETAMINOPHEN 325 MG PO TABS
ORAL_TABLET | ORAL | Status: AC
  Filled 2016-10-27: qty 2

## 2016-10-27 MED ORDER — SODIUM CHLORIDE 0.9% FLUSH
10.0000 mL | INTRAVENOUS | Status: DC | PRN
  Administered 2016-10-27: 10 mL
  Filled 2016-10-27: qty 10

## 2016-10-27 MED ORDER — ACETAMINOPHEN 325 MG PO TABS
650.0000 mg | ORAL_TABLET | Freq: Once | ORAL | Status: AC
  Administered 2016-10-27: 650 mg via ORAL

## 2016-10-27 MED ORDER — RITUXIMAB CHEMO INJECTION 500 MG/50ML
375.0000 mg/m2 | Freq: Once | INTRAVENOUS | Status: AC
  Administered 2016-10-27: 1100 mg via INTRAVENOUS
  Filled 2016-10-27: qty 100

## 2016-10-27 MED ORDER — SODIUM CHLORIDE 0.9 % IV SOLN
Freq: Once | INTRAVENOUS | Status: AC
  Administered 2016-10-27: 10:00:00 via INTRAVENOUS

## 2016-10-27 MED ORDER — ANTICOAGULANT SODIUM CITRATE 4% (200MG/5ML) IV SOLN
5.0000 mL | Freq: Once | Status: AC
  Administered 2016-10-27: 5 mL via INTRAVENOUS
  Filled 2016-10-27: qty 5

## 2016-10-27 MED ORDER — SODIUM CHLORIDE 0.9 % IJ SOLN
10.0000 mL | INTRAMUSCULAR | Status: AC | PRN
  Administered 2016-10-27: 10 mL
  Filled 2016-10-27: qty 10

## 2016-10-27 MED ORDER — DIPHENHYDRAMINE HCL 25 MG PO CAPS
ORAL_CAPSULE | ORAL | Status: AC
  Filled 2016-10-27: qty 2

## 2016-10-27 NOTE — Telephone Encounter (Signed)
Appointments scheduled per 3.16.18 LOS. Patient given AVS report and calendars with future scheduled appointments.  °

## 2016-10-27 NOTE — Assessment & Plan Note (Signed)
The patient has no evidence of CHF exacerbation except for chronic leg swelling Continue medical management I recommend him to quit smoking  

## 2016-10-27 NOTE — Progress Notes (Signed)
[facility] OFFICE PROGRESS NOTE  Patient Care Team: [name], MD as PCP - General (Internal Medicine) [name], MD as Consulting Physician (Cardiology) Heath Lark, MD as Consulting Physician (Hematology and Oncology) Jerrell Belfast, MD as Consulting Physician (Otolaryngology) Daryll Brod, MD as Consulting Physician (Orthopedic Surgery)  SUMMARY OF ONCOLOGIC HISTORY: Oncology History   FLIPI score of 2; stage 3 and areas of involvement >4     Follicular lymphoma ([place])   06/24/2009 Imaging    PET CT scan showed two small FDG positive left neck nodes      12/11/2014 Surgery    He underwent excisional lymph node biopsy of the left supraclavicular lymph node/neck region      12/11/2014 Pathology Results    Accession: [id number] biopsy show follicular lymphoma      12/24/2014 Imaging    ECHO showed LVH but preserved EF      12/25/2014 Imaging    PET scan showed disease above and below diaphragm      12/28/2014 Procedure    He has port placement      12/31/2014 - 01/01/2015 Chemotherapy    He received 1 cycle of bendamustine with rituximab, discontinued due to suspicion of allergic reaction to bendamustine      01/12/2015 - 01/19/2015 Hospital Admission    He was admitted to the hospital with suspicious allergic reaction, significant bilateral lower extremity edema with cellulitis, pneumonia and mild fluid overload      03/18/2015 - 04/08/2015 Chemotherapy    Treatment is switched to rituximab, Cytoxan, vincristine and prednisone      04/29/2015 Imaging     PET CT scan showed near complete response to treatment.      04/30/2015 -  Chemotherapy    He received maintenance Rituxan       INTERVAL HISTORY: Please see below for problem oriented charting. He returns today prior to rituximab treatment. He denies new lymphadenopathy. Denies recent infection. He has chronic bilateral lower extremity edema, stable. No recent chest pain, shortness of  breath or COPD exacerbation. PET CT scan has been rescheduled to next week due to delay in obtaining prior authorization REVIEW OF SYSTEMS:   Constitutional: Denies fevers, chills or abnormal weight loss Eyes: Denies blurriness of vision Ears, nose, mouth, throat, and face: Denies mucositis or sore throat Respiratory: Denies cough, dyspnea or wheezes Cardiovascular: Denies palpitation, chest discomfort  Gastrointestinal:  Denies nausea, heartburn or change in bowel habits Skin: Denies abnormal skin rashes Lymphatics: Denies new lymphadenopathy or easy bruising Neurological:Denies numbness, tingling or new weaknesses Behavioral/Psych: Mood is stable, no new changes  All other systems were reviewed with the patient and are negative.  I have reviewed the past medical history, past surgical history, social history and family history with the patient and they are unchanged from previous note.  ALLERGIES:  is allergic to bendamustine hcl; heparin; and tape.  MEDICATIONS:  Current Outpatient Prescriptions  Medication Sig Dispense Refill  . albuterol (PROVENTIL) (2.5 MG/3ML) 0.083% nebulizer solution Take 3 mLs (2.5 mg total) by nebulization every 2 (two) hours as needed for wheezing. 75 mL 2  . ANORO ELLIPTA 62.5-25 MCG/INH AEPB Inhale 1 puff into the lungs daily as needed. For wheezing & SOB 180 each 3  . aspirin EC 81 MG tablet Take 81 mg by mouth daily.    . clonazePAM (KLONOPIN) 0.5 MG tablet TAKE 1 TABLET TWICE A DAY AS NEEDED FOR ANXIETY OR AT BEDTME FOR INSOMNIA 180 tablet 1  .  clopidogrel (PLAVIX) 75 MG tablet Take 1 tablet (75 mg total) by mouth daily with breakfast. 90 tablet 3  . metoprolol succinate (TOPROL-XL) 50 MG 24 hr tablet TAKE 2 TABLETS (100MG )     DAILY WITH OR IMMEDIATELY  FOLLOWING A MEAL 180 tablet 3  . oxyCODONE-acetaminophen (PERCOCET/ROXICET) 5-325 MG tablet Take 1 tablet by mouth every 6 (six) hours as needed for severe pain. 120 tablet 0  . potassium chloride SA  (KLOR-CON M20) 20 MEQ tablet Take 1 tablet (20 mEq total) by mouth daily. 90 tablet 0  . simvastatin (ZOCOR) 20 MG tablet Take 1 tablet (20 mg total) by mouth at bedtime. 90 tablet 3  . torsemide (DEMADEX) 100 MG tablet Take 1 tablet (100 mg total) by mouth 2 (two) times daily. 180 tablet 3  . nitroGLYCERIN (NITROSTAT) 0.4 MG SL tablet Place 1 tablet (0.4 mg total) under the tongue every 5 (five) minutes as needed for chest pain (Up to 3 doses). (Patient not taking: Reported on 10/27/2016) 25 tablet 1   No current facility-administered medications for this visit.     PHYSICAL EXAMINATION: ECOG PERFORMANCE STATUS: 1 - Symptomatic but completely ambulatory  Vitals:   10/27/16 0808  BP: (!) 134/51  Pulse: 70  Resp: 20  Temp: 98.7 F (37.1 C)   Filed Weights   10/27/16 0808  Weight: (!) 349 lb 3.2 oz (158.4 kg)    GENERAL:alert, no distress and comfortable.  He is morbidly obese SKIN: skin color, texture, turgor are normal, no rashes or significant lesions EYES: normal, Conjunctiva are pink and non-injected, sclera clear OROPHARYNX:no exudate, no erythema and lips, buccal mucosa, and tongue normal  NECK: supple, thyroid normal size, non-tender, without nodularity LYMPH:  no palpable lymphadenopathy in the cervical, axillary or inguinal LUNGS: clear to auscultation and percussion with normal breathing effort HEART: regular rate & rhythm and no murmurs with moderate bilateral lower extremity edema ABDOMEN:abdomen soft, non-tender and normal bowel sounds Musculoskeletal:no cyanosis of digits and no clubbing  NEURO: alert & oriented x 3 with fluent speech, no focal motor/sensory deficits  LABORATORY DATA:  I have reviewed the data as listed    Component Value Date/Time   NA 139 08/25/2016 0833   K 4.1 08/25/2016 0833   CL 99 03/29/2016 1655   CO2 32 (H) 08/25/2016 0833   GLUCOSE 109 08/25/2016 0833   BUN 13.7 08/25/2016 0833   CREATININE 0.7 08/25/2016 0833   CALCIUM 9.3  08/25/2016 0833   PROT 6.7 08/25/2016 0833   ALBUMIN 3.9 08/25/2016 0833   AST 26 08/25/2016 0833   ALT 35 08/25/2016 0833   ALKPHOS 82 08/25/2016 0833   BILITOT 0.55 08/25/2016 0833   GFRNONAA >60 01/18/2015 0320   GFRAA >60 01/18/2015 0320    No results found for: SPEP, UPEP  Lab Results  Component Value Date   WBC 9.9 10/27/2016   NEUTROABS 7.7 (H) 10/27/2016   HGB 15.3 10/27/2016   HCT 45.3 10/27/2016   MCV 98.1 (H) 10/27/2016   PLT 138 (L) 10/27/2016      Chemistry      Component Value Date/Time   NA 139 08/25/2016 0833   K 4.1 08/25/2016 0833   CL 99 03/29/2016 1655   CO2 32 (H) 08/25/2016 0833   BUN 13.7 08/25/2016 0833   CREATININE 0.7 08/25/2016 0833      Component Value Date/Time   CALCIUM 9.3 08/25/2016 0833   ALKPHOS 82 08/25/2016 0833   AST 26 08/25/2016 0833   ALT  35 08/25/2016 0833   BILITOT 0.55 08/25/2016 0833      ASSESSMENT & PLAN:  Follicular lymphoma ([place]) The PET scan approval got delayed. PET CT scan is now scheduled for next week. Clinically, he has no signs of disease. We will proceed with rituximab treatment today. I will call the patient with test results of PET CT scan once it is available  Tobacco abuse I spent some time counseling the patient the importance of tobacco cessation. He is currently not interested to quit now.  Chronic diastolic CHF (congestive heart failure) ([facility]) The patient has no evidence of CHF exacerbation except for chronic leg swelling Continue medical management I recommend him to quit smoking    No orders of the defined types were placed in this encounter.  All questions were answered. The patient knows to call the clinic with any problems, questions or concerns. No barriers to learning was detected. I spent 15 minutes counseling the patient face to face. The total time spent in the appointment was 20 minutes and more than 50% was on counseling and review of test results     Heath Lark,  MD 10/27/2016 8:39 AM

## 2016-10-27 NOTE — Assessment & Plan Note (Signed)
I spent some time counseling the patient the importance of tobacco cessation. He is currently not interested to quit now.  

## 2016-10-27 NOTE — Assessment & Plan Note (Signed)
The PET scan approval got delayed. PET CT scan is now scheduled for next week. Clinically, he has no signs of disease. We will proceed with rituximab treatment today. I will call the patient with test results of PET CT scan once it is available

## 2016-10-27 NOTE — Patient Instructions (Signed)
[facility] Discharge Instructions for Patients Receiving Chemotherapy  Today you received the following chemotherapy agents:  Rituxan (rituximab)  To help prevent nausea and vomiting after your treatment, we encourage you to take your nausea medication as prescribed.   If you develop nausea and vomiting that is not controlled by your nausea medication, call the clinic.   BELOW ARE SYMPTOMS THAT SHOULD BE REPORTED IMMEDIATELY:  *FEVER GREATER THAN 100.5 F  *CHILLS WITH OR WITHOUT FEVER  NAUSEA AND VOMITING THAT IS NOT CONTROLLED WITH YOUR NAUSEA MEDICATION  *UNUSUAL SHORTNESS OF BREATH  *UNUSUAL BRUISING OR BLEEDING  TENDERNESS IN MOUTH AND THROAT WITH OR WITHOUT PRESENCE OF ULCERS  *URINARY PROBLEMS  *BOWEL PROBLEMS  UNUSUAL RASH Items with * indicate a potential emergency and should be followed up as soon as possible.  Feel free to call the clinic you have any questions or concerns. The clinic phone number is [phone].  Please show the [facility] at check-in to the Emergency Department and triage nurse.

## 2016-11-03 ENCOUNTER — Encounter: Payer: Self-pay | Admitting: Internal Medicine

## 2016-11-03 ENCOUNTER — Ambulatory Visit (HOSPITAL_COMMUNITY)
Admission: RE | Admit: 2016-11-03 | Discharge: 2016-11-03 | Disposition: A | Discharge status: Home / Self Care | Payer: BLUE CROSS/BLUE SHIELD | Source: Ambulatory Visit | Attending: Hematology and Oncology | Admitting: Hematology and Oncology

## 2016-11-03 ENCOUNTER — Ambulatory Visit (INDEPENDENT_AMBULATORY_CARE_PROVIDER_SITE_OTHER): Payer: BLUE CROSS/BLUE SHIELD | Admitting: Internal Medicine

## 2016-11-03 DIAGNOSIS — K8020 Calculus of gallbladder without cholecystitis without obstruction: Secondary | ICD-10-CM | POA: Diagnosis not present

## 2016-11-03 DIAGNOSIS — I700 Atherosclerosis of aorta: Secondary | ICD-10-CM | POA: Diagnosis not present

## 2016-11-03 DIAGNOSIS — G4733 Obstructive sleep apnea (adult) (pediatric): Secondary | ICD-10-CM | POA: Diagnosis not present

## 2016-11-03 DIAGNOSIS — I25810 Atherosclerosis of coronary artery bypass graft(s) without angina pectoris: Secondary | ICD-10-CM | POA: Diagnosis not present

## 2016-11-03 DIAGNOSIS — I779 Disorder of arteries and arterioles, unspecified: Secondary | ICD-10-CM

## 2016-11-03 DIAGNOSIS — M79604 Pain in right leg: Secondary | ICD-10-CM

## 2016-11-03 DIAGNOSIS — E1151 Type 2 diabetes mellitus with diabetic peripheral angiopathy without gangrene: Secondary | ICD-10-CM | POA: Diagnosis not present

## 2016-11-03 DIAGNOSIS — I517 Cardiomegaly: Secondary | ICD-10-CM | POA: Insufficient documentation

## 2016-11-03 DIAGNOSIS — I872 Venous insufficiency (chronic) (peripheral): Secondary | ICD-10-CM

## 2016-11-03 DIAGNOSIS — M79605 Pain in left leg: Secondary | ICD-10-CM

## 2016-11-03 DIAGNOSIS — Z951 Presence of aortocoronary bypass graft: Secondary | ICD-10-CM | POA: Diagnosis not present

## 2016-11-03 DIAGNOSIS — C8290 Follicular lymphoma, unspecified, unspecified site: Secondary | ICD-10-CM | POA: Diagnosis not present

## 2016-11-03 DIAGNOSIS — M545 Low back pain: Secondary | ICD-10-CM

## 2016-11-03 DIAGNOSIS — K760 Fatty (change of) liver, not elsewhere classified: Secondary | ICD-10-CM | POA: Insufficient documentation

## 2016-11-03 DIAGNOSIS — I739 Peripheral vascular disease, unspecified: Secondary | ICD-10-CM

## 2016-11-03 DIAGNOSIS — N200 Calculus of kidney: Secondary | ICD-10-CM | POA: Diagnosis not present

## 2016-11-03 DIAGNOSIS — C8228 Follicular lymphoma grade III, unspecified, lymph nodes of multiple sites: Secondary | ICD-10-CM

## 2016-11-03 DIAGNOSIS — I2510 Atherosclerotic heart disease of native coronary artery without angina pectoris: Secondary | ICD-10-CM | POA: Insufficient documentation

## 2016-11-03 DIAGNOSIS — I5032 Chronic diastolic (congestive) heart failure: Secondary | ICD-10-CM | POA: Diagnosis not present

## 2016-11-03 LAB — GLUCOSE, CAPILLARY: Glucose-Capillary: 104 mg/dL — ABNORMAL HIGH (ref 65–99)

## 2016-11-03 MED ORDER — FLUDEOXYGLUCOSE F - 18 (FDG) INJECTION
15.1300 | Freq: Once | INTRAVENOUS | Status: AC | PRN
  Administered 2016-11-03: 15.13 via INTRAVENOUS

## 2016-11-03 MED ORDER — OXYCODONE-ACETAMINOPHEN 5-325 MG PO TABS: 1.0000 | ORAL_TABLET | Freq: Four times a day (QID) | ORAL | 0 refills | Status: DC | PRN

## 2016-11-03 NOTE — Assessment & Plan Note (Signed)
Plavix, ASA, Simvastatin

## 2016-11-03 NOTE — Assessment & Plan Note (Signed)
Labs

## 2016-11-03 NOTE — Assessment & Plan Note (Signed)
Torsemide Elevate legs The best we can do.Marland KitchenMarland Kitchen

## 2016-11-03 NOTE — Assessment & Plan Note (Signed)
Not using CPAP.  

## 2016-11-03 NOTE — Assessment & Plan Note (Signed)
O2 at night NAS diet Torsemide, Hydralazine

## 2016-11-03 NOTE — Assessment & Plan Note (Signed)
Percocet prn 

## 2016-11-03 NOTE — Progress Notes (Signed)
Subjective:  Patient ID: [name], male    DOB: 1956-04-08  Age: 61 y.o. MRN: [id number]  CC: Congestive Heart Failure; Hypertension; Gastroesophageal Reflux; and COPD   HPI FERREL SIMINGTON presents for CHF, LBP, COPD f/u  Outpatient Medications Prior to Visit  Medication Sig Dispense Refill  . albuterol (PROVENTIL) (2.5 MG/3ML) 0.083% nebulizer solution Take 3 mLs (2.5 mg total) by nebulization every 2 (two) hours as needed for wheezing. 75 mL 2  . ANORO ELLIPTA 62.5-25 MCG/INH AEPB Inhale 1 puff into the lungs daily as needed. For wheezing & SOB 180 each 3  . aspirin EC 81 MG tablet Take 81 mg by mouth daily.    . clonazePAM (KLONOPIN) 0.5 MG tablet TAKE 1 TABLET TWICE A DAY AS NEEDED FOR ANXIETY OR AT BEDTME FOR INSOMNIA 180 tablet 1  . clopidogrel (PLAVIX) 75 MG tablet Take 1 tablet (75 mg total) by mouth daily with breakfast. 90 tablet 3  . metoprolol succinate (TOPROL-XL) 50 MG 24 hr tablet TAKE 2 TABLETS (100MG )     DAILY WITH OR IMMEDIATELY  FOLLOWING A MEAL 180 tablet 3  . nitroGLYCERIN (NITROSTAT) 0.4 MG SL tablet Place 1 tablet (0.4 mg total) under the tongue every 5 (five) minutes as needed for chest pain (Up to 3 doses). 25 tablet 1  . oxyCODONE-acetaminophen (PERCOCET/ROXICET) 5-325 MG tablet Take 1 tablet by mouth every 6 (six) hours as needed for severe pain. 120 tablet 0  . potassium chloride SA (KLOR-CON M20) 20 MEQ tablet Take 1 tablet (20 mEq total) by mouth daily. 90 tablet 0  . simvastatin (ZOCOR) 20 MG tablet Take 1 tablet (20 mg total) by mouth at bedtime. 90 tablet 3  . torsemide (DEMADEX) 100 MG tablet Take 1 tablet (100 mg total) by mouth 2 (two) times daily. 180 tablet 3   No facility-administered medications prior to visit.     ROS Review of Systems  Constitutional: Positive for fatigue. Negative for appetite change and unexpected weight change.  HENT: Negative for congestion, nosebleeds, sneezing, sore throat and trouble swallowing.   Eyes:  Negative for itching and visual disturbance.  Respiratory: Positive for shortness of breath. Negative for cough.   Cardiovascular: Positive for leg swelling. Negative for chest pain and palpitations.  Gastrointestinal: Negative for abdominal distention, blood in stool, diarrhea and nausea.  Genitourinary: Negative for frequency and hematuria.  Musculoskeletal: Positive for back pain. Negative for gait problem, joint swelling and neck pain.  Skin: Negative for rash.  Neurological: Negative for dizziness, tremors, speech difficulty and weakness.  Psychiatric/Behavioral: Negative for agitation, dysphoric mood, sleep disturbance and suicidal ideas. The patient is not nervous/anxious.     Objective:  BP (!) 132/58   Pulse 67   Temp 98.8 F (37.1 C) (Oral)   Resp 16   Ht 6' (1.829 m)   Wt (!) 342 lb 8 oz (155.4 kg)   SpO2 92%   BMI 46.45 kg/m   BP Readings from Last 3 Encounters:  11/03/16 (!) 132/58  10/27/16 (!) 116/59  10/27/16 (!) 134/51    Wt Readings from Last 3 Encounters:  11/03/16 (!) 342 lb 8 oz (155.4 kg)  10/27/16 (!) 349 lb 3.2 oz (158.4 kg)  08/25/16 (!) 360 lb 1.6 oz (163.3 kg)    Physical Exam  Constitutional: He is oriented to person, place, and time. He appears well-developed. No distress.  NAD  HENT:  Mouth/Throat: Oropharynx is clear and moist.  Eyes: Conjunctivae are normal. Pupils are equal,  round, and reactive to light.  Neck: Normal range of motion. No JVD present. No thyromegaly present.  Cardiovascular: Normal rate, regular rhythm and intact distal pulses.  Exam reveals no gallop and no friction rub.   Murmur heard. Pulmonary/Chest: Effort normal. No respiratory distress. He has wheezes. He has no rales. He exhibits no tenderness.  Abdominal: Soft. Bowel sounds are normal. He exhibits no distension and no mass. There is no tenderness. There is no rebound and no guarding.  Musculoskeletal: Normal range of motion. He exhibits edema and tenderness.    Lymphadenopathy:    He has no cervical adenopathy.  Neurological: He is alert and oriented to person, place, and time. He has normal reflexes. No cranial nerve deficit. He exhibits normal muscle tone. He displays a negative Romberg sign. Coordination and gait normal.  Skin: Skin is warm and dry. No rash noted.  Psychiatric: He has a normal mood and affect. His behavior is normal. Judgment and thought content normal.  B LE 1-2+ B Obese  Lab Results  Component Value Date   WBC 9.9 10/27/2016   HGB 15.3 10/27/2016   HCT 45.3 10/27/2016   PLT 138 (L) 10/27/2016   GLUCOSE 118 10/27/2016   CHOL 131 03/25/2014   TRIG 185.0 (H) 03/25/2014   HDL 24.00 (L) 03/25/2014   LDLCALC 70 03/25/2014   ALT 32 10/27/2016   AST 22 10/27/2016   NA 139 10/27/2016   K 4.0 10/27/2016   CL 99 03/29/2016   CREATININE 0.8 10/27/2016   BUN 13.8 10/27/2016   CO2 31 (H) 10/27/2016   TSH 2.01 02/25/2013   PSA 0.23 09/15/2011   INR 1.00 12/28/2014   HGBA1C 6.1 03/29/2016   MICROALBUR 0.7 03/29/2016    Nm Pet Image Restag (ps) Skull Base To Thigh  Result Date: 11/03/2016 CLINICAL DATA:  Subsequent treatment strategy for grade 3 follicular lymphoma diagnosed April 2016 treated with chemotherapy, currently on maintenance Rituxan. EXAM: NUCLEAR MEDICINE PET SKULL BASE TO THIGH TECHNIQUE: 15.1 mCi F-18 FDG was injected intravenously. Full-ring PET imaging was performed from the skull base to thigh after the radiotracer. CT data was obtained and used for attenuation correction and anatomic localization. FASTING BLOOD GLUCOSE:  Value: 104 mg/dl COMPARISON:  10/11/2015 PET-CT. FINDINGS: NECK No hypermetabolic lymph nodes in the neck. CHEST Internal jugular MediPort terminates in the right atrium near the cavoatrial junction. Left anterior descending, left circumflex and right coronary atherosclerosis status post CABG. Mild cardiomegaly . Atherosclerotic nonaneurysmal thoracic aorta. Stable dilated main pulmonary artery  (3.5 cm diameter). No enlarged or hypermetabolic axillary, mediastinal or hilar lymph nodes. Stable calcified pleural plaques bilaterally, left greater than right, without pleural effusions. No pneumothorax. Mild centrilobular emphysema and diffuse bronchial wall thickening. No acute consolidative airspace disease, lung masses or significant pulmonary nodules. Stable scattered small parenchymal bands throughout the periphery of both lungs compatible with mild scarring. Stable symmetric mild gynecomastia. Stable configuration of median sternotomy wires. ABDOMEN/PELVIS No abnormal hypermetabolic activity within the liver, pancreas, adrenal glands, or spleen. No enlarged or hypermetabolic lymph nodes in the abdomen or pelvis. Previously described 10 mm right external iliac node measures 8 mm short axis (series 4/ image 189). Diffuse hepatic steatosis. Cholelithiasis. Stable appearance of the adrenal glands without discrete adrenal nodules. Nonobstructing bilateral nephrolithiasis with the largest stones measuring 7 mm in the interpolar right kidney and 7 mm in the lower left kidney. No hydronephrosis. Atherosclerotic nonaneurysmal abdominal aorta. Normal size spleen. SKELETON No focal hypermetabolic activity to suggest skeletal metastasis. Mild  patchy marrow hypermetabolism throughout the axial skeleton appears stable and is probably due to a mildly reactive marrow state. IMPRESSION: 1. No metabolic evidence of recurrent lymphoma. 2. Mild patchy marrow hypermetabolism throughout the axial skeleton, unchanged, probably due to a mildly reactive marrow state. 3. 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 Electronically Signed   By: Ilona Sorrel M.D.   On: 11/03/2016 16:11    Assessment & Plan:   There are no diagnoses linked to this encounter. I am having Mr. Broughton maintain his aspirin EC, albuterol, nitroGLYCERIN, torsemide, metoprolol succinate, clonazePAM, oxyCODONE-acetaminophen, clopidogrel, simvastatin, ANORO ELLIPTA, and potassium chloride SA.  No orders of the defined types were placed in this encounter.    Follow-up: No Follow-up on file.  Walker Kehr, MD

## 2016-11-03 NOTE — Progress Notes (Signed)
Pre-visit discussion using our clinic review tool. No additional management support is needed unless otherwise documented below in the visit note.  

## 2016-11-06 ENCOUNTER — Telehealth: Payer: Self-pay | Admitting: *Deleted

## 2016-11-06 NOTE — Telephone Encounter (Signed)
Reviewed message below with patient.

## 2016-11-06 NOTE — Telephone Encounter (Signed)
-----   Message from Heath Lark, MD sent at 11/06/2016  6:36 AM EDT ----- Regarding: PET scan Pls let him know scans look good, no lymphoma. Will review with him when he sees me in June

## 2016-11-09 ENCOUNTER — Telehealth: Payer: Self-pay

## 2016-11-09 NOTE — Telephone Encounter (Signed)
Called with below message. 

## 2016-11-09 NOTE — Telephone Encounter (Signed)
-----   Message from Heath Lark, MD sent at 11/06/2016  6:36 AM EDT ----- Regarding: PET scan Pls let him know scans look good, no lymphoma. Will review with him when he sees me in June

## 2016-11-13 DIAGNOSIS — J449 Chronic obstructive pulmonary disease, unspecified: Secondary | ICD-10-CM | POA: Diagnosis not present

## 2016-11-20 ENCOUNTER — Encounter: Payer: Self-pay | Admitting: Cardiovascular Disease

## 2016-11-23 ENCOUNTER — Other Ambulatory Visit: Payer: Self-pay | Admitting: Internal Medicine

## 2016-12-04 NOTE — Progress Notes (Signed)
Patient ID: [name], male   DOB: 1956/01/01, 61 y.o.   MRN: [id number] 61 y.o. 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  LHC 09/29/11: Distal left main 30%, ostial LAD 90%, LIMA-LAD patent, proximal RI occluded, ostial SVG-RI 95%, proximal RCA occluded, proximal SVG-PDA 95%, EF 55%. The patient underwent PCI with Dr. Burt Knack on the same date: Promus DES to the SVG-RI and Promus DES to the SVG-PDA. Labs: Hemoglobin 15.9, potassium 4.7, creatinine 0.4.  March 14 bilateral ICA 40-59%   Has grade 3  follicular lymphoma diagnosed 11/2014  with  LE edema/ cellulitis Also ? Melanoma on back with biopsy PET /CT no metabolic evidence of recurrent Lymphoma 11/03/16  ROS: Denies fever, malais, weight loss, blurry vision, decreased visual acuity, cough, sputum, SOB, hemoptysis, pleuritic pain, palpitaitons, heartburn, abdominal pain, melena, lower extremity edema, claudication, or rash.  All other systems reviewed and negative  General: Affect appropriate Obese male  HEENT: huge firm nodular mass in left supraclavicular fossa posterior to CEA scar  Neck supple with no adenopathy JVP normal bilateral   bruits no thyromegaly left supraclavicular adenopathy Lungs clear with no wheezing and good diaphragmatic motion Heart:  S1/S2 SEM  murmur, no rub, gallop or click PMI normal Abdomen: benighn, BS positve, no tenderness, no AAA small umbilical hernia  no bruit.  No HSM or HJR Distal pulses intact with no bruits Plus 2 bilateral  Edema   Neuro non-focal Skin warm and dry No muscular weakness   Current Outpatient Prescriptions  Medication Sig Dispense Refill  . albuterol (PROVENTIL) (2.5 MG/3ML) 0.083% nebulizer solution Take 3 mLs (2.5 mg total) by nebulization every 2 (two) hours as needed for wheezing. 75 mL 2  .  ANORO ELLIPTA 62.5-25 MCG/INH AEPB Inhale 1 puff into the lungs daily as needed. For wheezing & SOB 180 each 3  . aspirin EC 81 MG tablet Take 81 mg by mouth daily.    . clonazePAM (KLONOPIN) 0.5 MG tablet TAKE 1 TABLET TWICE A DAY AS NEEDED FOR ANXIETY OR AT BEDTME FOR INSOMNIA 180 tablet 1  . clopidogrel (PLAVIX) 75 MG tablet Take 1 tablet (75 mg total) by mouth daily with breakfast. 90 tablet 3  . metoprolol succinate (TOPROL-XL) 50 MG 24 hr tablet TAKE 2 TABLETS (100MG )     DAILY WITH OR IMMEDIATELY  FOLLOWING A MEAL 180 tablet 3  . nitroGLYCERIN (NITROSTAT) 0.4 MG SL tablet Place 1 tablet (0.4 mg total) under the tongue every 5 (five) minutes as needed for chest pain (Up to 3 doses). 25 tablet 1  . oxyCODONE-acetaminophen (PERCOCET/ROXICET) 5-325 MG tablet Take 1 tablet by mouth every 6 (six) hours as needed for severe pain. 120 tablet 0  . potassium chloride SA (KLOR-CON M20) 20 MEQ tablet Take 1 tablet (20 mEq total) by mouth daily. 90 tablet 0  . simvastatin (ZOCOR) 20 MG tablet Take 1 tablet (20 mg total) by mouth at bedtime. 90 tablet 3  . torsemide (DEMADEX) 100 MG tablet TAKE 1 TABLET DAILY 90 tablet 1   No current facility-administered medications for this visit.     Allergies  Bendamustine hcl; Heparin; and Tape  Electrocardiogram:  06/27/13  SR rate 60  RBBB  Today SR rate 68  ICRBBB LAE   01/21/16  SR rate 66 RBBB Pac  12/07/16  SR rate 63 RBBB PR 212  Assessment and Plan CAD:  CABG 2005  Cath 2013 with stent to SVG IM and SVG PDA no angina stable continue medical Rx Edema:  Related to obesity and chemo and chronic venous disease Demedex called In 100 bid so he has extra Lymphoma:  F/u oncology PET scan with no hypermetabolic spots  continue chemo  Lymphadenopathy much better Two more rounds of immunoRx with rituxen  June and August   Chol:   Lab Results  Component Value Date   Standard 70 03/25/2014   Bruit:  Post Left CEA with residual 40-59% bilateral disease ASA  F/u  duplex ordered  Murmur:  AV murmur reviewed echo 02/08/16 no significant valve disease  Abnormal ECG:  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

## 2016-12-07 ENCOUNTER — Ambulatory Visit (INDEPENDENT_AMBULATORY_CARE_PROVIDER_SITE_OTHER): Payer: BLUE CROSS/BLUE SHIELD | Admitting: Cardiovascular Disease

## 2016-12-07 ENCOUNTER — Encounter: Payer: Self-pay | Admitting: Cardiovascular Disease

## 2016-12-07 VITALS — BP 140/64 | HR 68 | Ht 72.0 in | Wt 340.4 lb

## 2016-12-07 DIAGNOSIS — I779 Disorder of arteries and arterioles, unspecified: Secondary | ICD-10-CM | POA: Diagnosis not present

## 2016-12-07 DIAGNOSIS — R011 Cardiac murmur, unspecified: Secondary | ICD-10-CM

## 2016-12-07 DIAGNOSIS — I739 Peripheral vascular disease, unspecified: Secondary | ICD-10-CM

## 2016-12-07 NOTE — Patient Instructions (Signed)
Medication Instructions:  Your physician recommends that you continue on your current medications as directed. Please refer to the Current Medication list given to you today.  Labwork: NONE  Testing/Procedures: Your physician has requested that you have a carotid duplex. This test is an ultrasound of the carotid arteries in your neck. It looks at blood flow through these arteries that supply the brain with blood. Allow one hour for this exam. There are no restrictions or special instructions.  Follow-Up: Your physician wants you to follow-up in: 6 months with Dr. Nishan. You will receive a reminder letter in the mail two months in advance. If you don't receive a letter, please call our office to schedule the follow-up appointment.   If you need a refill on your cardiac medications before your next appointment, please call your pharmacy.    

## 2016-12-13 DIAGNOSIS — J449 Chronic obstructive pulmonary disease, unspecified: Secondary | ICD-10-CM | POA: Diagnosis not present

## 2016-12-14 ENCOUNTER — Ambulatory Visit (HOSPITAL_COMMUNITY)
Admission: RE | Admit: 2016-12-14 | Discharge: 2016-12-14 | Disposition: A | Discharge status: Home / Self Care | Payer: BLUE CROSS/BLUE SHIELD | Source: Ambulatory Visit | Attending: Cardiology | Admitting: Cardiology

## 2016-12-14 DIAGNOSIS — Z951 Presence of aortocoronary bypass graft: Secondary | ICD-10-CM | POA: Diagnosis not present

## 2016-12-14 DIAGNOSIS — I739 Peripheral vascular disease, unspecified: Secondary | ICD-10-CM

## 2016-12-14 DIAGNOSIS — E785 Hyperlipidemia, unspecified: Secondary | ICD-10-CM | POA: Insufficient documentation

## 2016-12-14 DIAGNOSIS — I6523 Occlusion and stenosis of bilateral carotid arteries: Secondary | ICD-10-CM | POA: Insufficient documentation

## 2016-12-14 DIAGNOSIS — I10 Essential (primary) hypertension: Secondary | ICD-10-CM | POA: Diagnosis not present

## 2016-12-14 DIAGNOSIS — I2510 Atherosclerotic heart disease of native coronary artery without angina pectoris: Secondary | ICD-10-CM | POA: Diagnosis not present

## 2016-12-14 DIAGNOSIS — E1151 Type 2 diabetes mellitus with diabetic peripheral angiopathy without gangrene: Secondary | ICD-10-CM | POA: Diagnosis not present

## 2016-12-14 DIAGNOSIS — J449 Chronic obstructive pulmonary disease, unspecified: Secondary | ICD-10-CM | POA: Diagnosis not present

## 2016-12-14 DIAGNOSIS — Z87891 Personal history of nicotine dependence: Secondary | ICD-10-CM | POA: Insufficient documentation

## 2016-12-14 DIAGNOSIS — I779 Disorder of arteries and arterioles, unspecified: Secondary | ICD-10-CM

## 2017-01-12 ENCOUNTER — Other Ambulatory Visit (HOSPITAL_BASED_OUTPATIENT_CLINIC_OR_DEPARTMENT_OTHER): Payer: BLUE CROSS/BLUE SHIELD

## 2017-01-12 ENCOUNTER — Ambulatory Visit (HOSPITAL_BASED_OUTPATIENT_CLINIC_OR_DEPARTMENT_OTHER): Payer: BLUE CROSS/BLUE SHIELD

## 2017-01-12 ENCOUNTER — Telehealth: Payer: Self-pay | Admitting: Hematology and Oncology

## 2017-01-12 ENCOUNTER — Ambulatory Visit (HOSPITAL_BASED_OUTPATIENT_CLINIC_OR_DEPARTMENT_OTHER): Payer: BLUE CROSS/BLUE SHIELD | Admitting: Hematology and Oncology

## 2017-01-12 ENCOUNTER — Ambulatory Visit: Payer: BLUE CROSS/BLUE SHIELD

## 2017-01-12 VITALS — BP 123/59 | HR 57 | Temp 98.7°F | Resp 18

## 2017-01-12 VITALS — BP 150/55 | HR 61 | Temp 99.0°F | Resp 18 | Ht 72.0 in | Wt 342.3 lb

## 2017-01-12 DIAGNOSIS — C8228 Follicular lymphoma grade III, unspecified, lymph nodes of multiple sites: Secondary | ICD-10-CM | POA: Diagnosis not present

## 2017-01-12 DIAGNOSIS — D696 Thrombocytopenia, unspecified: Secondary | ICD-10-CM

## 2017-01-12 DIAGNOSIS — Z720 Tobacco use: Secondary | ICD-10-CM

## 2017-01-12 DIAGNOSIS — C8238 Follicular lymphoma grade IIIa, lymph nodes of multiple sites: Secondary | ICD-10-CM

## 2017-01-12 DIAGNOSIS — R609 Edema, unspecified: Secondary | ICD-10-CM | POA: Diagnosis not present

## 2017-01-12 DIAGNOSIS — C228 Malignant neoplasm of liver, primary, unspecified as to type: Secondary | ICD-10-CM

## 2017-01-12 DIAGNOSIS — Z5112 Encounter for antineoplastic immunotherapy: Secondary | ICD-10-CM

## 2017-01-12 DIAGNOSIS — Z95828 Presence of other vascular implants and grafts: Secondary | ICD-10-CM

## 2017-01-12 LAB — CBC WITH DIFFERENTIAL/PLATELET
BASO%: 0.7 % (ref 0.0–2.0)
Basophils Absolute: 0.1 10*3/uL (ref 0.0–0.1)
EOS%: 3.6 % (ref 0.0–7.0)
Eosinophils Absolute: 0.3 10*3/uL (ref 0.0–0.5)
HCT: 44.9 % (ref 38.4–49.9)
HEMOGLOBIN: 14.7 g/dL (ref 13.0–17.1)
LYMPH%: 8 % — AB (ref 14.0–49.0)
MCH: 33.2 pg (ref 27.2–33.4)
MCHC: 32.7 g/dL (ref 32.0–36.0)
MCV: 101.4 fL — ABNORMAL HIGH (ref 79.3–98.0)
MONO#: 0.9 10*3/uL (ref 0.1–0.9)
MONO%: 9.9 % (ref 0.0–14.0)
NEUT%: 77.8 % — ABNORMAL HIGH (ref 39.0–75.0)
NEUTROS ABS: 7.2 10*3/uL — AB (ref 1.5–6.5)
Platelets: 123 10*3/uL — ABNORMAL LOW (ref 140–400)
RBC: 4.43 10*6/uL (ref 4.20–5.82)
RDW: 13.1 % (ref 11.0–14.6)
WBC: 9.2 10*3/uL (ref 4.0–10.3)
lymph#: 0.7 10*3/uL — ABNORMAL LOW (ref 0.9–3.3)

## 2017-01-12 LAB — COMPREHENSIVE METABOLIC PANEL
ALBUMIN: 4 g/dL (ref 3.5–5.0)
ALK PHOS: 86 U/L (ref 40–150)
ALT: 22 U/L (ref 0–55)
AST: 17 U/L (ref 5–34)
Anion Gap: 9 mEq/L (ref 3–11)
BUN: 10.5 mg/dL (ref 7.0–26.0)
CO2: 30 meq/L — AB (ref 22–29)
Calcium: 9.4 mg/dL (ref 8.4–10.4)
Chloride: 101 mEq/L (ref 98–109)
Creatinine: 0.8 mg/dL (ref 0.7–1.3)
GLUCOSE: 137 mg/dL (ref 70–140)
POTASSIUM: 3.9 meq/L (ref 3.5–5.1)
SODIUM: 140 meq/L (ref 136–145)
Total Bilirubin: 0.4 mg/dL (ref 0.20–1.20)
Total Protein: 6.7 g/dL (ref 6.4–8.3)

## 2017-01-12 MED ORDER — ACETAMINOPHEN 325 MG PO TABS
650.0000 mg | ORAL_TABLET | Freq: Once | ORAL | Status: AC
  Administered 2017-01-12: 650 mg via ORAL

## 2017-01-12 MED ORDER — ANTICOAGULANT SODIUM CITRATE 4% (200MG/5ML) IV SOLN
5.0000 mL | Freq: Once | Status: AC
  Administered 2017-01-12: 5 mL via INTRAVENOUS
  Filled 2017-01-12: qty 5

## 2017-01-12 MED ORDER — SODIUM CHLORIDE 0.9% FLUSH
10.0000 mL | Freq: Once | INTRAVENOUS | Status: AC
  Administered 2017-01-12: 10 mL
  Filled 2017-01-12: qty 10

## 2017-01-12 MED ORDER — ACETAMINOPHEN 325 MG PO TABS
ORAL_TABLET | ORAL | Status: AC
  Filled 2017-01-12: qty 2

## 2017-01-12 MED ORDER — SODIUM CHLORIDE 0.9 % IV SOLN
Freq: Once | INTRAVENOUS | Status: AC
  Administered 2017-01-12: 10:00:00 via INTRAVENOUS

## 2017-01-12 MED ORDER — SODIUM CHLORIDE 0.9% FLUSH
10.0000 mL | INTRAVENOUS | Status: DC | PRN
  Administered 2017-01-12: 10 mL
  Filled 2017-01-12: qty 10

## 2017-01-12 MED ORDER — SODIUM CHLORIDE 0.9 % IV SOLN
375.0000 mg/m2 | Freq: Once | INTRAVENOUS | Status: AC
  Administered 2017-01-12: 1100 mg via INTRAVENOUS
  Filled 2017-01-12: qty 100

## 2017-01-12 MED ORDER — DIPHENHYDRAMINE HCL 25 MG PO CAPS
ORAL_CAPSULE | ORAL | Status: AC
  Filled 2017-01-12: qty 2

## 2017-01-12 MED ORDER — DIPHENHYDRAMINE HCL 25 MG PO CAPS
50.0000 mg | ORAL_CAPSULE | Freq: Once | ORAL | Status: AC
  Administered 2017-01-12: 50 mg via ORAL

## 2017-01-12 NOTE — Patient Instructions (Signed)
[facility] Discharge Instructions for Patients Receiving Chemotherapy  Today you received the following chemotherapy agents:  Rituxan (rituximab)  To help prevent nausea and vomiting after your treatment, we encourage you to take your nausea medication as prescribed.   If you develop nausea and vomiting that is not controlled by your nausea medication, call the clinic.   BELOW ARE SYMPTOMS THAT SHOULD BE REPORTED IMMEDIATELY:  *FEVER GREATER THAN 100.5 F  *CHILLS WITH OR WITHOUT FEVER  NAUSEA AND VOMITING THAT IS NOT CONTROLLED WITH YOUR NAUSEA MEDICATION  *UNUSUAL SHORTNESS OF BREATH  *UNUSUAL BRUISING OR BLEEDING  TENDERNESS IN MOUTH AND THROAT WITH OR WITHOUT PRESENCE OF ULCERS  *URINARY PROBLEMS  *BOWEL PROBLEMS  UNUSUAL RASH Items with * indicate a potential emergency and should be followed up as soon as possible.  Feel free to call the clinic you have any questions or concerns. The clinic phone number is [phone].  Please show the [facility] at check-in to the Emergency Department and triage nurse.

## 2017-01-12 NOTE — Telephone Encounter (Signed)
Gave patient AVS and calender per 6/1 LOS . [facility] to contact patient with CT schedule.Marland Kitchen

## 2017-01-13 ENCOUNTER — Encounter: Payer: Self-pay | Admitting: Hematology and Oncology

## 2017-01-13 DIAGNOSIS — J449 Chronic obstructive pulmonary disease, unspecified: Secondary | ICD-10-CM | POA: Diagnosis not present

## 2017-01-13 NOTE — Assessment & Plan Note (Signed)
This is likely due to fatty liver disease. The patient denies recent history of bleeding such as epistaxis, hematuria or hematochezia. He is asymptomatic from the low platelet count. I will observe for now.

## 2017-01-13 NOTE — Assessment & Plan Note (Signed)
Recent PET scan showed no signs of disease. We will proceed with rituximab treatment today. After this treatment, we will discontinue therapy He will return here in 6 weeks for port flush and a plan to repeat one more imaging study in 3 months If repeat CT scan is negative for malignancy, I will get the port removed.

## 2017-01-13 NOTE — Assessment & Plan Note (Signed)
I spent some time counseling the patient the importance of tobacco cessation. He is currently trying to quit on his own. 

## 2017-01-13 NOTE — Progress Notes (Signed)
[facility] OFFICE PROGRESS NOTE  Patient Care Team: Plotnikov, [name], MD as PCP - General (Internal Medicine) [name], MD as Consulting Physician (Cardiology) Heath Lark, MD as Consulting Physician (Hematology and Oncology) Jerrell Belfast, MD as Consulting Physician (Otolaryngology) Daryll Brod, MD as Consulting Physician (Orthopedic Surgery)  SUMMARY OF ONCOLOGIC HISTORY: Oncology History   FLIPI score of 2; stage 3 and areas of involvement >4     Follicular lymphoma ([place])   06/24/2009 Imaging    PET CT scan showed two small FDG positive left neck nodes      12/11/2014 Surgery    He underwent excisional lymph node biopsy of the left supraclavicular lymph node/neck region      12/11/2014 Pathology Results    Accession: [id number] biopsy show follicular lymphoma      12/24/2014 Imaging    ECHO showed LVH but preserved EF      12/25/2014 Imaging    PET scan showed disease above and below diaphragm      12/28/2014 Procedure    He has port placement      12/31/2014 - 01/01/2015 Chemotherapy    He received 1 cycle of bendamustine with rituximab, discontinued due to suspicion of allergic reaction to bendamustine      01/12/2015 - 01/19/2015 Hospital Admission    He was admitted to the hospital with suspicious allergic reaction, significant bilateral lower extremity edema with cellulitis, pneumonia and mild fluid overload      03/18/2015 - 04/08/2015 Chemotherapy    Treatment is switched to rituximab, Cytoxan, vincristine and prednisone      04/29/2015 Imaging     PET CT scan showed near complete response to treatment.      04/30/2015 -  Chemotherapy    He received maintenance Rituxan      11/03/2016 PET scan    No metabolic evidence of recurrent lymphoma. Mild patchy marrow hypermetabolism throughout the axial skeleton, unchanged, probably due to a mildly reactive marrow state. 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       INTERVAL HISTORY: Please see below for problem oriented charting. He is seen as part of his final treatment with rituximab He is doing well He denies recent infection No new lymphadenopathy He is attempting to quit smoking He continues to have chronic bilateral lower extremity edema.  No recent venous stasis ulcer He denies recent chest pain or shortness of breath  REVIEW OF SYSTEMS:   Constitutional: Denies fevers, chills or abnormal weight loss Eyes: Denies blurriness of vision Ears, nose, mouth, throat, and face: Denies mucositis or sore throat Respiratory: Denies cough, dyspnea or wheezes Cardiovascular: Denies palpitation, chest discomfort  Gastrointestinal:  Denies nausea, heartburn or change in bowel habits Skin: Denies abnormal skin rashes Lymphatics: Denies new lymphadenopathy or easy bruising Neurological:Denies numbness, tingling or new weaknesses Behavioral/Psych: Mood is stable, no new changes  All other systems were reviewed with the patient and are negative.  I have reviewed the past medical history, past surgical history, social history and family history with the patient and they are unchanged from previous note.  ALLERGIES:  is allergic to bendamustine hcl; heparin; and tape.  MEDICATIONS:  Current Outpatient Prescriptions  Medication Sig Dispense Refill  . albuterol (PROVENTIL) (2.5 MG/3ML) 0.083% nebulizer solution Take 3 mLs (2.5 mg total)  by nebulization every 2 (two) hours as needed for wheezing. 75 mL 2  . ANORO ELLIPTA 62.5-25 MCG/INH AEPB Inhale 1 puff into the lungs daily as needed. For wheezing & SOB 180 each 3  . aspirin EC 81 MG tablet Take 81  mg by mouth daily.    . clonazePAM (KLONOPIN) 0.5 MG tablet TAKE 1 TABLET TWICE A DAY AS NEEDED FOR ANXIETY OR AT BEDTME FOR INSOMNIA 180 tablet 1  . clopidogrel (PLAVIX) 75 MG tablet Take 1 tablet (75 mg total) by mouth daily with breakfast. 90 tablet 3  . metoprolol succinate (TOPROL-XL) 50 MG 24 hr tablet TAKE 2 TABLETS (100MG )     DAILY WITH OR IMMEDIATELY  FOLLOWING A MEAL 180 tablet 3  . oxyCODONE-acetaminophen (PERCOCET/ROXICET) 5-325 MG tablet Take 1 tablet by mouth every 6 (six) hours as needed for severe pain. 120 tablet 0  . potassium chloride SA (KLOR-CON M20) 20 MEQ tablet Take 1 tablet (20 mEq total) by mouth daily. 90 tablet 0  . simvastatin (ZOCOR) 20 MG tablet Take 1 tablet (20 mg total) by mouth at bedtime. 90 tablet 3  . torsemide (DEMADEX) 100 MG tablet TAKE 1 TABLET DAILY 90 tablet 1  . nitroGLYCERIN (NITROSTAT) 0.4 MG SL tablet Place 1 tablet (0.4 mg total) under the tongue every 5 (five) minutes as needed for chest pain (Up to 3 doses). (Patient not taking: Reported on 01/12/2017) 25 tablet 1   No current facility-administered medications for this visit.     PHYSICAL EXAMINATION: ECOG PERFORMANCE STATUS: 1 - Symptomatic but completely ambulatory  Vitals:   01/12/17 0824  BP: (!) 150/55  Pulse: 61  Resp: 18  Temp: 99 F (37.2 C)   Filed Weights   01/12/17 0824  Weight: (!) 342 lb 4.8 oz (155.3 kg)    GENERAL:alert, no distress and comfortable.  He is morbidly obese SKIN: skin color, texture, turgor are normal, no rashes or significant lesions EYES: normal, Conjunctiva are pink and non-injected, sclera clear OROPHARYNX:no exudate, no erythema and lips, buccal mucosa, and tongue normal  NECK: supple, thyroid normal size, non-tender, without nodularity LYMPH:  no palpable lymphadenopathy in the cervical, axillary or inguinal LUNGS: clear to auscultation and percussion with normal breathing effort HEART: regular rate & rhythm and no murmurs moderate bilateral lower  extremity edema ABDOMEN:abdomen soft, non-tender and normal bowel sounds Musculoskeletal:no cyanosis of digits and no clubbing  NEURO: alert & oriented x 3 with fluent speech, no focal motor/sensory deficits  LABORATORY DATA:  I have reviewed the data as listed    Component Value Date/Time   NA 140 01/12/2017 0804   K 3.9 01/12/2017 0804   CL 99 03/29/2016 1655   CO2 30 (H) 01/12/2017 0804   GLUCOSE 137 01/12/2017 0804   BUN 10.5 01/12/2017 0804   CREATININE 0.8 01/12/2017 0804   CALCIUM 9.4 01/12/2017 0804   PROT 6.7 01/12/2017 0804   ALBUMIN 4.0 01/12/2017 0804   AST 17 01/12/2017 0804   ALT 22 01/12/2017 0804   ALKPHOS 86 01/12/2017 0804   BILITOT 0.40 01/12/2017 0804   GFRNONAA >60 01/18/2015 0320   GFRAA >60 01/18/2015 0320    No results found for: SPEP, UPEP  Lab Results  Component Value Date   WBC 9.2 01/12/2017   NEUTROABS 7.2 (H) 01/12/2017   HGB 14.7 01/12/2017   HCT 44.9 01/12/2017   MCV 101.4 (H) 01/12/2017   PLT 123 (L) 01/12/2017      Chemistry  Component Value Date/Time   NA 140 01/12/2017 0804   K 3.9 01/12/2017 0804   CL 99 03/29/2016 1655   CO2 30 (H) 01/12/2017 0804   BUN 10.5 01/12/2017 0804   CREATININE 0.8 01/12/2017 0804      Component Value Date/Time   CALCIUM 9.4 01/12/2017 0804   ALKPHOS 86 01/12/2017 0804   AST 17 01/12/2017 0804   ALT 22 01/12/2017 0804   BILITOT 0.40 01/12/2017 0804      ASSESSMENT & PLAN:  Follicular lymphoma ([place]) Recent PET scan showed no signs of disease. We will proceed with rituximab treatment today. After this treatment, we will discontinue therapy He will return here in 6 weeks for port flush and a plan to repeat one more imaging study in 3 months If repeat CT scan is negative for malignancy, I will get the port removed.  Thrombocytopenia ([place]) This is likely due to fatty liver disease. The patient denies recent history of bleeding such as epistaxis, hematuria or hematochezia. He is  asymptomatic from the low platelet count. I will observe for now.  Tobacco abuse I spent some time counseling the patient the importance of tobacco cessation. He is currently trying to quit on his own.   Orders Placed This Encounter  Procedures  . CT CHEST W CONTRAST    Standing Status:   Future    Standing Expiration Date:   03/14/2018    Order Specific Question:   Reason for Exam (SYMPTOM  OR DIAGNOSIS REQUIRED)    Answer:   staging lymphoma, exclude recurrence    Order Specific Question:   Preferred imaging location?    Answer:   [facility]  . CT ABDOMEN PELVIS W CONTRAST    Standing Status:   Future    Standing Expiration Date:   04/14/2018    Order Specific Question:   Reason for Exam (SYMPTOM  OR DIAGNOSIS REQUIRED)    Answer:   staging lymphoma, exclude recurrence    Order Specific Question:   Preferred imaging location?    Answer:   [facility]   All questions were answered. The patient knows to call the clinic with any problems, questions or concerns. No barriers to learning was detected. I spent 15 minutes counseling the patient face to face. The total time spent in the appointment was 20 minutes and more than 50% was on counseling and review of test results     Heath Lark, MD 01/13/2017 8:43 AM

## 2017-01-26 ENCOUNTER — Ambulatory Visit (INDEPENDENT_AMBULATORY_CARE_PROVIDER_SITE_OTHER): Payer: BLUE CROSS/BLUE SHIELD | Admitting: Internal Medicine

## 2017-01-26 ENCOUNTER — Encounter: Payer: Self-pay | Admitting: Internal Medicine

## 2017-01-26 DIAGNOSIS — I87313 Chronic venous hypertension (idiopathic) with ulcer of bilateral lower extremity: Secondary | ICD-10-CM

## 2017-01-26 DIAGNOSIS — I10 Essential (primary) hypertension: Secondary | ICD-10-CM

## 2017-01-26 DIAGNOSIS — M545 Low back pain: Secondary | ICD-10-CM

## 2017-01-26 DIAGNOSIS — L97919 Non-pressure chronic ulcer of unspecified part of right lower leg with unspecified severity: Secondary | ICD-10-CM

## 2017-01-26 DIAGNOSIS — F411 Generalized anxiety disorder: Secondary | ICD-10-CM

## 2017-01-26 DIAGNOSIS — M79605 Pain in left leg: Secondary | ICD-10-CM

## 2017-01-26 DIAGNOSIS — L97929 Non-pressure chronic ulcer of unspecified part of left lower leg with unspecified severity: Secondary | ICD-10-CM

## 2017-01-26 DIAGNOSIS — M79604 Pain in right leg: Secondary | ICD-10-CM

## 2017-01-26 MED ORDER — OXYCODONE-ACETAMINOPHEN 5-325 MG PO TABS: 1.0000 | ORAL_TABLET | Freq: Four times a day (QID) | ORAL | 0 refills | Status: DC | PRN

## 2017-01-26 MED ORDER — CLONAZEPAM 0.5 MG PO TABS: ORAL_TABLET | ORAL | 1 refills | Status: DC

## 2017-01-26 NOTE — Assessment & Plan Note (Signed)
On chemo 

## 2017-01-26 NOTE — Assessment & Plan Note (Signed)
Percocet prn  Potential benefits of a long term opioids use as well as potential risks (i.e. addiction risk, apnea etc) and complications (i.e. Somnolence, constipation and others) were explained to the patient and were aknowledged.  

## 2017-01-26 NOTE — Assessment & Plan Note (Signed)
Clonazepam prn 

## 2017-01-26 NOTE — Assessment & Plan Note (Signed)
On Torsemide, Hydralazine 

## 2017-01-26 NOTE — Assessment & Plan Note (Signed)
Cont w/wt loss 

## 2017-01-26 NOTE — Progress Notes (Signed)
Subjective:  Patient ID: [name], male    DOB: Apr 24, 1956  Age: 61 y.o. MRN: [id number]  CC: No chief complaint on file.   HPI [name] presents for LBP, anxiety, edema f/u  Outpatient Medications Prior to Visit  Medication Sig Dispense Refill  . albuterol (PROVENTIL) (2.5 MG/3ML) 0.083% nebulizer solution Take 3 mLs (2.5 mg total) by nebulization every 2 (two) hours as needed for wheezing. 75 mL 2  . ANORO ELLIPTA 62.5-25 MCG/INH AEPB Inhale 1 puff into the lungs daily as needed. For wheezing & SOB 180 each 3  . aspirin EC 81 MG tablet Take 81 mg by mouth daily.    . clonazePAM (KLONOPIN) 0.5 MG tablet TAKE 1 TABLET TWICE A DAY AS NEEDED FOR ANXIETY OR AT BEDTME FOR INSOMNIA 180 tablet 1  . clopidogrel (PLAVIX) 75 MG tablet Take 1 tablet (75 mg total) by mouth daily with breakfast. 90 tablet 3  . metoprolol succinate (TOPROL-XL) 50 MG 24 hr tablet TAKE 2 TABLETS (100MG )     DAILY WITH OR IMMEDIATELY  FOLLOWING A MEAL 180 tablet 3  . nitroGLYCERIN (NITROSTAT) 0.4 MG SL tablet Place 1 tablet (0.4 mg total) under the tongue every 5 (five) minutes as needed for chest pain (Up to 3 doses). 25 tablet 1  . oxyCODONE-acetaminophen (PERCOCET/ROXICET) 5-325 MG tablet Take 1 tablet by mouth every 6 (six) hours as needed for severe pain. 120 tablet 0  . potassium chloride SA (KLOR-CON M20) 20 MEQ tablet Take 1 tablet (20 mEq total) by mouth daily. 90 tablet 0  . simvastatin (ZOCOR) 20 MG tablet Take 1 tablet (20 mg total) by mouth at bedtime. 90 tablet 3  . torsemide (DEMADEX) 100 MG tablet TAKE 1 TABLET DAILY 90 tablet 1   No facility-administered medications prior to visit.     ROS Review of Systems  Constitutional: Positive for fatigue. Negative for appetite change and unexpected weight change.  HENT: Negative for congestion, nosebleeds, sneezing, sore throat and trouble swallowing.   Eyes: Negative for itching and visual disturbance.  Respiratory: Negative for cough.     Cardiovascular: Positive for leg swelling. Negative for chest pain and palpitations.  Gastrointestinal: Negative for abdominal distention, blood in stool, diarrhea and nausea.  Genitourinary: Negative for frequency and hematuria.  Musculoskeletal: Positive for back pain and gait problem. Negative for joint swelling and neck pain.  Skin: Negative for rash.  Neurological: Negative for dizziness, tremors, speech difficulty and weakness.  Psychiatric/Behavioral: Negative for agitation, dysphoric mood and sleep disturbance. The patient is nervous/anxious.     Objective:  BP 132/68 (BP Location: Left Arm, Patient Position: Sitting, Cuff Size: Large)   Pulse 64   Temp 98.9 F (37.2 C) (Oral)   Ht 6' (1.829 m)   Wt (!) 339 lb (153.8 kg)   SpO2 96%   BMI 45.98 kg/m   BP Readings from Last 3 Encounters:  01/26/17 132/68  01/12/17 (!) 123/59  01/12/17 (!) 150/55    Wt Readings from Last 3 Encounters:  01/26/17 (!) 339 lb (153.8 kg)  01/12/17 (!) 342 lb 4.8 oz (155.3 kg)  12/07/16 (!) 340 lb 6.4 oz (154.4 kg)    Physical Exam  Constitutional: He is oriented to person, place, and time. He appears well-developed. No distress.  NAD  HENT:  Mouth/Throat: Oropharynx is clear and moist.  Eyes: Conjunctivae are normal. Pupils are equal, round, and reactive to light.  Neck: Normal range of motion. No JVD present. No thyromegaly present.  Cardiovascular: Normal rate, regular rhythm, normal heart sounds and intact distal pulses.  Exam reveals no gallop and no friction rub.   No murmur heard. Pulmonary/Chest: Effort normal and breath sounds normal. No respiratory distress. He has no wheezes. He has no rales. He exhibits no tenderness.  Abdominal: Soft. Bowel sounds are normal. He exhibits no distension and no mass. There is no tenderness. There is no rebound and no guarding.  Musculoskeletal: Normal range of motion. He exhibits edema and tenderness.  Lymphadenopathy:    He has no cervical  adenopathy.  Neurological: He is alert and oriented to person, place, and time. He has normal reflexes. No cranial nerve deficit. He exhibits normal muscle tone. He displays a negative Romberg sign. Coordination and gait normal.  Skin: Skin is warm and dry. No rash noted.  Psychiatric: He has a normal mood and affect. His behavior is normal. Judgment and thought content normal.  1+ B legs LS tender  Lab Results  Component Value Date   WBC 9.2 01/12/2017   HGB 14.7 01/12/2017   HCT 44.9 01/12/2017   PLT 123 (L) 01/12/2017   GLUCOSE 137 01/12/2017   CHOL 131 03/25/2014   TRIG 185.0 (H) 03/25/2014   HDL 24.00 (L) 03/25/2014   LDLCALC 70 03/25/2014   ALT 22 01/12/2017   AST 17 01/12/2017   NA 140 01/12/2017   K 3.9 01/12/2017   CL 99 03/29/2016   CREATININE 0.8 01/12/2017   BUN 10.5 01/12/2017   CO2 30 (H) 01/12/2017   TSH 2.01 02/25/2013   PSA 0.23 09/15/2011   INR 1.00 12/28/2014   HGBA1C 6.1 03/29/2016   MICROALBUR 0.7 03/29/2016    No results found.  Assessment & Plan:   There are no diagnoses linked to this encounter. I am having Mr. Beckner maintain his aspirin EC, albuterol, nitroGLYCERIN, metoprolol succinate, clonazePAM, clopidogrel, simvastatin, ANORO ELLIPTA, potassium chloride SA, oxyCODONE-acetaminophen, and torsemide.  No orders of the defined types were placed in this encounter.    Follow-up: No Follow-up on file.  Walker Kehr, MD

## 2017-02-09 ENCOUNTER — Ambulatory Visit: Payer: BLUE CROSS/BLUE SHIELD | Admitting: Internal Medicine

## 2017-02-12 DIAGNOSIS — J449 Chronic obstructive pulmonary disease, unspecified: Secondary | ICD-10-CM | POA: Diagnosis not present

## 2017-02-23 ENCOUNTER — Ambulatory Visit (HOSPITAL_BASED_OUTPATIENT_CLINIC_OR_DEPARTMENT_OTHER): Payer: BLUE CROSS/BLUE SHIELD

## 2017-02-23 DIAGNOSIS — Z452 Encounter for adjustment and management of vascular access device: Secondary | ICD-10-CM

## 2017-02-23 DIAGNOSIS — C8228 Follicular lymphoma grade III, unspecified, lymph nodes of multiple sites: Secondary | ICD-10-CM

## 2017-02-23 DIAGNOSIS — Z95828 Presence of other vascular implants and grafts: Secondary | ICD-10-CM

## 2017-02-23 MED ORDER — ANTICOAGULANT SODIUM CITRATE 4% (200MG/5ML) IV SOLN
5.0000 mL | Freq: Once | Status: AC
  Administered 2017-02-23: 5 mL
  Filled 2017-02-23: qty 5

## 2017-02-23 MED ORDER — SODIUM CHLORIDE 0.9% FLUSH
10.0000 mL | Freq: Once | INTRAVENOUS | Status: AC
  Administered 2017-02-23: 10 mL
  Filled 2017-02-23: qty 10

## 2017-03-15 DIAGNOSIS — J449 Chronic obstructive pulmonary disease, unspecified: Secondary | ICD-10-CM | POA: Diagnosis not present

## 2017-04-19 ENCOUNTER — Ambulatory Visit: Payer: BLUE CROSS/BLUE SHIELD

## 2017-04-19 ENCOUNTER — Other Ambulatory Visit (HOSPITAL_BASED_OUTPATIENT_CLINIC_OR_DEPARTMENT_OTHER): Payer: BLUE CROSS/BLUE SHIELD

## 2017-04-19 ENCOUNTER — Ambulatory Visit (HOSPITAL_COMMUNITY)
Admission: RE | Admit: 2017-04-19 | Discharge: 2017-04-19 | Disposition: A | Discharge status: Home / Self Care | Payer: BLUE CROSS/BLUE SHIELD | Source: Ambulatory Visit | Attending: Hematology and Oncology | Admitting: Hematology and Oncology

## 2017-04-19 DIAGNOSIS — K760 Fatty (change of) liver, not elsewhere classified: Secondary | ICD-10-CM | POA: Insufficient documentation

## 2017-04-19 DIAGNOSIS — C8238 Follicular lymphoma grade IIIa, lymph nodes of multiple sites: Secondary | ICD-10-CM

## 2017-04-19 DIAGNOSIS — N200 Calculus of kidney: Secondary | ICD-10-CM | POA: Diagnosis not present

## 2017-04-19 DIAGNOSIS — I700 Atherosclerosis of aorta: Secondary | ICD-10-CM | POA: Diagnosis not present

## 2017-04-19 DIAGNOSIS — C8590 Non-Hodgkin lymphoma, unspecified, unspecified site: Secondary | ICD-10-CM | POA: Diagnosis not present

## 2017-04-19 DIAGNOSIS — C8228 Follicular lymphoma grade III, unspecified, lymph nodes of multiple sites: Secondary | ICD-10-CM

## 2017-04-19 DIAGNOSIS — K8020 Calculus of gallbladder without cholecystitis without obstruction: Secondary | ICD-10-CM | POA: Insufficient documentation

## 2017-04-19 DIAGNOSIS — J948 Other specified pleural conditions: Secondary | ICD-10-CM | POA: Insufficient documentation

## 2017-04-19 DIAGNOSIS — Z95828 Presence of other vascular implants and grafts: Secondary | ICD-10-CM

## 2017-04-19 LAB — COMPREHENSIVE METABOLIC PANEL
ALBUMIN: 3.8 g/dL (ref 3.5–5.0)
ALK PHOS: 82 U/L (ref 40–150)
ALT: 21 U/L (ref 0–55)
ANION GAP: 9 meq/L (ref 3–11)
AST: 18 U/L (ref 5–34)
BILIRUBIN TOTAL: 0.53 mg/dL (ref 0.20–1.20)
BUN: 8.7 mg/dL (ref 7.0–26.0)
CALCIUM: 9.5 mg/dL (ref 8.4–10.4)
CO2: 33 mEq/L — ABNORMAL HIGH (ref 22–29)
Chloride: 100 mEq/L (ref 98–109)
Creatinine: 0.7 mg/dL (ref 0.7–1.3)
Glucose: 113 mg/dl (ref 70–140)
Potassium: 4.1 mEq/L (ref 3.5–5.1)
Sodium: 141 mEq/L (ref 136–145)
TOTAL PROTEIN: 6.8 g/dL (ref 6.4–8.3)

## 2017-04-19 LAB — CBC WITH DIFFERENTIAL/PLATELET
BASO%: 0.8 % (ref 0.0–2.0)
Basophils Absolute: 0.1 10*3/uL (ref 0.0–0.1)
EOS%: 3.6 % (ref 0.0–7.0)
Eosinophils Absolute: 0.4 10*3/uL (ref 0.0–0.5)
HCT: 46.4 % (ref 38.4–49.9)
HEMOGLOBIN: 15.5 g/dL (ref 13.0–17.1)
LYMPH%: 7.1 % — AB (ref 14.0–49.0)
MCH: 33.2 pg (ref 27.2–33.4)
MCHC: 33.3 g/dL (ref 32.0–36.0)
MCV: 99.5 fL — AB (ref 79.3–98.0)
MONO#: 0.8 10*3/uL (ref 0.1–0.9)
MONO%: 8.2 % (ref 0.0–14.0)
NEUT%: 80.3 % — ABNORMAL HIGH (ref 39.0–75.0)
NEUTROS ABS: 8.3 10*3/uL — AB (ref 1.5–6.5)
PLATELETS: 128 10*3/uL — AB (ref 140–400)
RBC: 4.66 10*6/uL (ref 4.20–5.82)
RDW: 13.4 % (ref 11.0–14.6)
WBC: 10.3 10*3/uL (ref 4.0–10.3)
lymph#: 0.7 10*3/uL — ABNORMAL LOW (ref 0.9–3.3)

## 2017-04-19 MED ORDER — IOPAMIDOL (ISOVUE-300) INJECTION 61%
INTRAVENOUS | Status: AC
Start: 2017-04-19 — End: 2017-04-19
  Filled 2017-04-19: qty 100

## 2017-04-19 MED ORDER — SODIUM CHLORIDE 0.9% FLUSH
10.0000 mL | Freq: Once | INTRAVENOUS | Status: AC
  Administered 2017-04-19: 10 mL
  Filled 2017-04-19: qty 10

## 2017-04-19 MED ORDER — ANTICOAGULANT SODIUM CITRATE 4% (200MG/5ML) IV SOLN
5.0000 mL | Freq: Once | Status: DC
  Filled 2017-04-19: qty 5

## 2017-04-19 MED ORDER — ANTICOAGULANT SODIUM CITRATE 4% (200MG/5ML) IV SOLN
5.0000 mL | Freq: Once | Status: AC
  Administered 2017-04-19: 5 mL
  Filled 2017-04-19: qty 5

## 2017-04-19 MED ORDER — IOPAMIDOL (ISOVUE-300) INJECTION 61%
100.0000 mL | Freq: Once | INTRAVENOUS | Status: AC | PRN
  Administered 2017-04-19: 100 mL via INTRAVENOUS

## 2017-04-20 ENCOUNTER — Encounter: Payer: Self-pay | Admitting: Hematology and Oncology

## 2017-04-20 ENCOUNTER — Telehealth: Payer: Self-pay | Admitting: Hematology and Oncology

## 2017-04-20 ENCOUNTER — Ambulatory Visit (HOSPITAL_BASED_OUTPATIENT_CLINIC_OR_DEPARTMENT_OTHER): Payer: BLUE CROSS/BLUE SHIELD | Admitting: Hematology and Oncology

## 2017-04-20 VITALS — BP 140/56 | HR 74 | Temp 98.4°F | Resp 20 | Wt 329.2 lb

## 2017-04-20 DIAGNOSIS — Z720 Tobacco use: Secondary | ICD-10-CM | POA: Diagnosis not present

## 2017-04-20 DIAGNOSIS — D696 Thrombocytopenia, unspecified: Secondary | ICD-10-CM

## 2017-04-20 DIAGNOSIS — C8228 Follicular lymphoma grade III, unspecified, lymph nodes of multiple sites: Secondary | ICD-10-CM

## 2017-04-20 DIAGNOSIS — I5032 Chronic diastolic (congestive) heart failure: Secondary | ICD-10-CM | POA: Diagnosis not present

## 2017-04-20 NOTE — Progress Notes (Signed)
[facility] OFFICE PROGRESS NOTE  Patient Care Team: Plotnikov, [name], MD as PCP - General (Internal Medicine) [name], MD as Consulting Physician (Cardiology) Heath Lark, MD as Consulting Physician (Hematology and Oncology) Jerrell Belfast, MD as Consulting Physician (Otolaryngology) Daryll Brod, MD as Consulting Physician (Orthopedic Surgery)  SUMMARY OF ONCOLOGIC HISTORY: Oncology History   FLIPI score of 2; stage 3 and areas of involvement >4     Follicular lymphoma ([place])   06/24/2009 Imaging    PET CT scan showed two small FDG positive left neck nodes      12/11/2014 Surgery    He underwent excisional lymph node biopsy of the left supraclavicular lymph node/neck region      12/11/2014 Pathology Results    Accession: [id number] biopsy show follicular lymphoma      12/24/2014 Imaging    ECHO showed LVH but preserved EF      12/25/2014 Imaging    PET scan showed disease above and below diaphragm      12/28/2014 Procedure    He has port placement      12/31/2014 - 01/01/2015 Chemotherapy    He received 1 cycle of bendamustine with rituximab, discontinued due to suspicion of allergic reaction to bendamustine      01/12/2015 - 01/19/2015 Hospital Admission    He was admitted to the hospital with suspicious allergic reaction, significant bilateral lower extremity edema with cellulitis, pneumonia and mild fluid overload      03/18/2015 - 04/08/2015 Chemotherapy    Treatment is switched to rituximab, Cytoxan, vincristine and prednisone      04/29/2015 Imaging     PET CT scan showed near complete response to treatment.      04/30/2015 - 01/12/2017 Chemotherapy    He received maintenance Rituxan      11/03/2016 PET scan    No metabolic evidence of recurrent lymphoma. Mild patchy marrow hypermetabolism throughout the axial skeleton, unchanged, probably due to a mildly reactive marrow state. 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      04/19/2017 Imaging    1. No evidence for lymphadenopathy in the chest, abdomen, or pelvis. 2. Aortic Atherosclerois (ICD10-170.0) 3. 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 4. Bilateral nonobstructing nephrolithiasis. 5. Cholelithiasis. 6. Hepatic steatosis       INTERVAL HISTORY: Please see below for problem oriented charting. He returns with his wife for further follow-up He feels well No recent infection No new lymphadenopathy He continues to smoke but is attempting to quit smoking The patient denies any recent signs or symptoms of bleeding such as spontaneous epistaxis, hematuria or hematochezia.  REVIEW OF SYSTEMS:   Constitutional: Denies fevers, chills or abnormal weight loss Eyes: Denies blurriness of vision Ears, nose, mouth, throat, and face: Denies mucositis or sore throat Respiratory: Denies cough, dyspnea or wheezes Cardiovascular: Denies palpitation, chest discomfort or lower extremity swelling Gastrointestinal:  Denies nausea, heartburn or change in bowel habits Skin: Denies abnormal skin rashes Lymphatics: Denies new lymphadenopathy or easy bruising Neurological:Denies numbness, tingling or new weaknesses Behavioral/Psych: Mood is stable, no new changes  All other systems were reviewed with the patient and are negative.  I have reviewed the past medical history, past surgical history, social history and family history with the patient and they are unchanged from  previous note.  ALLERGIES:  is allergic to bendamustine hcl; heparin; and tape.  MEDICATIONS:  Current Outpatient Prescriptions  Medication Sig  Dispense Refill  . albuterol (PROVENTIL) (2.5 MG/3ML) 0.083% nebulizer solution Take 3 mLs (2.5 mg total) by nebulization every 2 (two) hours as needed for wheezing. 75 mL 2  . ANORO ELLIPTA 62.5-25 MCG/INH AEPB Inhale 1 puff into the lungs daily as needed. For wheezing & SOB 180 each 3  . aspirin EC 81 MG tablet Take 81 mg by mouth daily.    . clonazePAM (KLONOPIN) 0.5 MG tablet TAKE 1 TABLET TWICE A DAY AS NEEDED FOR ANXIETY OR AT BEDTME FOR INSOMNIA 180 tablet 1  . clopidogrel (PLAVIX) 75 MG tablet Take 1 tablet (75 mg total) by mouth daily with breakfast. 90 tablet 3  . metoprolol succinate (TOPROL-XL) 50 MG 24 hr tablet TAKE 2 TABLETS (100MG )     DAILY WITH OR IMMEDIATELY  FOLLOWING A MEAL 180 tablet 3  . nitroGLYCERIN (NITROSTAT) 0.4 MG SL tablet Place 1 tablet (0.4 mg total) under the tongue every 5 (five) minutes as needed for chest pain (Up to 3 doses). 25 tablet 1  . oxyCODONE-acetaminophen (PERCOCET/ROXICET) 5-325 MG tablet Take 1 tablet by mouth every 6 (six) hours as needed for severe pain. 120 tablet 0  . potassium chloride SA (KLOR-CON M20) 20 MEQ tablet Take 1 tablet (20 mEq total) by mouth daily. 90 tablet 0  . simvastatin (ZOCOR) 20 MG tablet Take 1 tablet (20 mg total) by mouth at bedtime. 90 tablet 3  . torsemide (DEMADEX) 100 MG tablet TAKE 1 TABLET DAILY 90 tablet 1   No current facility-administered medications for this visit.     PHYSICAL EXAMINATION: ECOG PERFORMANCE STATUS: 0 - Asymptomatic  Vitals:   04/20/17 1352  BP: (!) 140/56  Pulse: 74  Resp: 20  Temp: 98.4 F (36.9 C)  SpO2: 91%   Filed Weights   04/20/17 1352  Weight: (!) 329 lb 3.2 oz (149.3 kg)    GENERAL:alert, no distress and comfortable.  He is morbidly obese SKIN: skin color, texture, turgor are normal, no rashes or significant lesions EYES: normal, Conjunctiva are pink and non-injected, sclera clear HEART: He has bilateral lower extremity edema Musculoskeletal:no cyanosis of digits and no  clubbing  NEURO: alert & oriented x 3 with fluent speech, no focal motor/sensory deficits  LABORATORY DATA:  I have reviewed the data as listed    Component Value Date/Time   NA 141 04/19/2017 0822   K 4.1 04/19/2017 0822   CL 99 03/29/2016 1655   CO2 33 (H) 04/19/2017 0822   GLUCOSE 113 04/19/2017 0822   BUN 8.7 04/19/2017 0822   CREATININE 0.7 04/19/2017 0822   CALCIUM 9.5 04/19/2017 0822   PROT 6.8 04/19/2017 0822   ALBUMIN 3.8 04/19/2017 0822   AST 18 04/19/2017 0822   ALT 21 04/19/2017 0822   ALKPHOS 82 04/19/2017 0822   BILITOT 0.53 04/19/2017 0822   GFRNONAA >60 01/18/2015 0320   GFRAA >60 01/18/2015 0320    No results found for: SPEP, UPEP  Lab Results  Component Value Date   WBC 10.3 04/19/2017   NEUTROABS 8.3 (H) 04/19/2017   HGB 15.5 04/19/2017   HCT 46.4 04/19/2017   MCV 99.5 (H) 04/19/2017   PLT 128 (L) 04/19/2017      Chemistry      Component Value Date/Time   NA 141 04/19/2017 0822   K 4.1 04/19/2017 0822   CL 99 03/29/2016 1655  CO2 33 (H) 04/19/2017 0822   BUN 8.7 04/19/2017 0822   CREATININE 0.7 04/19/2017 0822      Component Value Date/Time   CALCIUM 9.5 04/19/2017 0822   ALKPHOS 82 04/19/2017 0822   AST 18 04/19/2017 0822   ALT 21 04/19/2017 0822   BILITOT 0.53 04/19/2017 1025       RADIOGRAPHIC STUDIES: I reviewed imaging study with the patient and his wife I have personally reviewed the radiological images as listed and agreed with the findings in the report. Ct Chest W Contrast  Result Date: 04/19/2017 CLINICAL DATA:  Non-Hodgkin's lymphoma EXAM: CT CHEST, ABDOMEN, AND PELVIS WITH CONTRAST TECHNIQUE: Multidetector CT imaging of the chest, abdomen and pelvis was performed following the standard protocol during bolus administration of intravenous contrast. CONTRAST:  166mL ISOVUE-300 IOPAMIDOL (ISOVUE-300) INJECTION 61% COMPARISON:  PET-CT 11/03/2016 FINDINGS: CT CHEST FINDINGS Cardiovascular: Heart size upper normal. No pericardial  effusion. Coronary artery calcification is evident. Patient is status post CABG. Atherosclerotic calcification is noted in the wall of the thoracic aorta. Right Port-A-Cath tip is positioned in the mid to lower right atrium. Mediastinum/Nodes: No mediastinal lymphadenopathy. There is no hilar lymphadenopathy. The esophagus has normal imaging features. There is no axillary lymphadenopathy. Lungs/Pleura: Diffuse bronchial wall thickening is associated with areas of chronic atelectasis and/ or scarring bilaterally. Calcified pleural plaques are seen bilaterally. Musculoskeletal: Bone windows reveal no worrisome lytic or sclerotic osseous lesions. CT ABDOMEN PELVIS FINDINGS Hepatobiliary: No focal abnormality within the liver parenchyma. The liver shows diffusely decreased attenuation suggesting steatosis. Calcified gallstones measure in the 05-2014 mm size range. No intrahepatic or extrahepatic biliary dilation. Pancreas: No focal mass lesion. No dilatation of the main duct. No intraparenchymal cyst. No peripancreatic edema. Spleen: No splenomegaly. No focal mass lesion. Adrenals/Urinary Tract: Stable 16 mm right adrenal nodule. 14 mm left adrenal nodule also stable. Other skin tiny adrenal nodules are evident on the left. Two nonobstructing stones are seen in the right kidney, the larger measuring 6 x 10 mm. A single nonobstructing stone is seen in the lower pole of the left kidney measuring 8 x 7 mm. No evidence for hydroureter. The urinary bladder appears normal for the degree of distention. Stomach/Bowel: Stomach is nondistended. No gastric wall thickening. No evidence of outlet obstruction. Duodenum is normally positioned as is the ligament of Treitz. No small bowel wall thickening. No small bowel dilatation. The terminal ileum is normal. The appendix is normal. No gross colonic mass. No colonic wall thickening. No substantial diverticular change. Vascular/Lymphatic: There is abdominal aortic atherosclerosis  without aneurysm. There is no gastrohepatic or hepatoduodenal ligament lymphadenopathy. No intraperitoneal or retroperitoneal lymphadenopathy. No pelvic sidewall lymphadenopathy. Reproductive: The prostate gland and seminal vesicles have normal imaging features. Other: No intraperitoneal free fluid. Musculoskeletal: Bone windows reveal no worrisome lytic or sclerotic osseous lesions. IMPRESSION: 1. No evidence for lymphadenopathy in the chest, abdomen, or pelvis. 2.  Aortic Atherosclerois (ICD10-170.0) 3. 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 4. Bilateral nonobstructing nephrolithiasis. 5. Cholelithiasis. 6. Hepatic steatosis. Electronically Signed   By: Misty Stanley M.D.   On: 04/19/2017 14:11   Ct Abdomen Pelvis W Contrast  Result Date: 04/19/2017 CLINICAL DATA:  Non-Hodgkin's lymphoma EXAM: CT CHEST, ABDOMEN, AND PELVIS WITH CONTRAST TECHNIQUE: Multidetector CT imaging of the chest, abdomen and pelvis was performed following the standard protocol during bolus administration of intravenous contrast. CONTRAST:  122mL ISOVUE-300 IOPAMIDOL (ISOVUE-300) INJECTION 61% COMPARISON:  PET-CT 11/03/2016 FINDINGS: CT CHEST FINDINGS Cardiovascular: Heart size upper normal.  No pericardial effusion. Coronary artery calcification is evident. Patient is status post CABG. Atherosclerotic calcification is noted in the wall of the thoracic aorta. Right Port-A-Cath tip is positioned in the mid to lower right atrium. Mediastinum/Nodes: No mediastinal lymphadenopathy. There is no hilar lymphadenopathy. The esophagus has normal imaging features. There is no axillary lymphadenopathy. Lungs/Pleura: Diffuse bronchial wall thickening is associated with areas of chronic atelectasis and/ or scarring bilaterally. Calcified pleural plaques are seen bilaterally. Musculoskeletal: Bone windows reveal no worrisome lytic or sclerotic osseous lesions. CT ABDOMEN PELVIS FINDINGS Hepatobiliary: No focal  abnormality within the liver parenchyma. The liver shows diffusely decreased attenuation suggesting steatosis. Calcified gallstones measure in the 05-2014 mm size range. No intrahepatic or extrahepatic biliary dilation. Pancreas: No focal mass lesion. No dilatation of the main duct. No intraparenchymal cyst. No peripancreatic edema. Spleen: No splenomegaly. No focal mass lesion. Adrenals/Urinary Tract: Stable 16 mm right adrenal nodule. 14 mm left adrenal nodule also stable. Other skin tiny adrenal nodules are evident on the left. Two nonobstructing stones are seen in the right kidney, the larger measuring 6 x 10 mm. A single nonobstructing stone is seen in the lower pole of the left kidney measuring 8 x 7 mm. No evidence for hydroureter. The urinary bladder appears normal for the degree of distention. Stomach/Bowel: Stomach is nondistended. No gastric wall thickening. No evidence of outlet obstruction. Duodenum is normally positioned as is the ligament of Treitz. No small bowel wall thickening. No small bowel dilatation. The terminal ileum is normal. The appendix is normal. No gross colonic mass. No colonic wall thickening. No substantial diverticular change. Vascular/Lymphatic: There is abdominal aortic atherosclerosis without aneurysm. There is no gastrohepatic or hepatoduodenal ligament lymphadenopathy. No intraperitoneal or retroperitoneal lymphadenopathy. No pelvic sidewall lymphadenopathy. Reproductive: The prostate gland and seminal vesicles have normal imaging features. Other: No intraperitoneal free fluid. Musculoskeletal: Bone windows reveal no worrisome lytic or sclerotic osseous lesions. IMPRESSION: 1. No evidence for lymphadenopathy in the chest, abdomen, or pelvis. 2.  Aortic Atherosclerois (ICD10-170.0) 3. 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 4. Bilateral nonobstructing nephrolithiasis. 5. Cholelithiasis. 6. Hepatic steatosis. Electronically Signed   By: Misty Stanley M.D.   On: 04/19/2017 14:11    ASSESSMENT & PLAN:  Follicular lymphoma ([place]) Recent CT scan showed no signs of disease. I will get the port removed I will see him back in 6 months with repeat history, physical examination and blood work I plan to repeat imaging study next year, due September 2019  Thrombocytopenia [facility]) This is likely due to fatty liver disease. The patient denies recent history of bleeding such as epistaxis, hematuria or hematochezia. He is asymptomatic from the low platelet count. I will observe for now.  Tobacco abuse I spent some time counseling the patient the importance of tobacco cessation. He is currently trying to quit on his own.  Chronic diastolic CHF (congestive heart failure) ([facility]) The patient has no evidence of CHF exacerbation except for chronic leg swelling Continue medical management I recommend him to quit smoking    Orders Placed This Encounter  Procedures  . IR Removal Tun Access W/ Port W/O FL    Standing Status:   Future    Standing Expiration Date:   06/20/2018    Order Specific Question:   Reason for exam:    Answer:   no need port anymore. FYI: patient on plavix    Order Specific Question:   Preferred Imaging Location?    Answer:   [place]  [facility]   All questions were answered. The patient knows to call the clinic with any problems, questions or concerns. No barriers to learning was detected. I spent 15 minutes counseling the patient face to face. The total time spent in the appointment was 20 minutes and more than 50% was on counseling and review of test results     Heath Lark, MD 04/20/2017 2:47 PM

## 2017-04-20 NOTE — Assessment & Plan Note (Signed)
The patient has no evidence of CHF exacerbation except for chronic leg swelling Continue medical management I recommend him to quit smoking

## 2017-04-20 NOTE — Assessment & Plan Note (Signed)
This is likely due to fatty liver disease. The patient denies recent history of bleeding such as epistaxis, hematuria or hematochezia. He is asymptomatic from the low platelet count. I will observe for now.

## 2017-04-20 NOTE — Assessment & Plan Note (Signed)
I spent some time counseling the patient the importance of tobacco cessation. He is currently trying to quit on his own. 

## 2017-04-20 NOTE — Assessment & Plan Note (Signed)
Recent CT scan showed no signs of disease. I will get the port removed I will see him back in 6 months with repeat history, physical examination and blood work I plan to repeat imaging study next year, due September 2019

## 2017-04-20 NOTE — Telephone Encounter (Signed)
Gave avs and calendar for march appointment °

## 2017-04-23 DIAGNOSIS — Z85828 Personal history of other malignant neoplasm of skin: Secondary | ICD-10-CM | POA: Diagnosis not present

## 2017-04-23 DIAGNOSIS — L720 Epidermal cyst: Secondary | ICD-10-CM | POA: Diagnosis not present

## 2017-04-26 ENCOUNTER — Other Ambulatory Visit: Payer: Self-pay | Admitting: Internal Medicine

## 2017-04-30 ENCOUNTER — Ambulatory Visit (INDEPENDENT_AMBULATORY_CARE_PROVIDER_SITE_OTHER): Payer: BLUE CROSS/BLUE SHIELD | Admitting: Internal Medicine

## 2017-04-30 ENCOUNTER — Encounter: Payer: Self-pay | Admitting: Internal Medicine

## 2017-04-30 DIAGNOSIS — T7840XA Allergy, unspecified, initial encounter: Secondary | ICD-10-CM | POA: Diagnosis not present

## 2017-04-30 DIAGNOSIS — E1151 Type 2 diabetes mellitus with diabetic peripheral angiopathy without gangrene: Secondary | ICD-10-CM

## 2017-04-30 DIAGNOSIS — I25810 Atherosclerosis of coronary artery bypass graft(s) without angina pectoris: Secondary | ICD-10-CM

## 2017-04-30 DIAGNOSIS — C8228 Follicular lymphoma grade III, unspecified, lymph nodes of multiple sites: Secondary | ICD-10-CM | POA: Diagnosis not present

## 2017-04-30 DIAGNOSIS — J449 Chronic obstructive pulmonary disease, unspecified: Secondary | ICD-10-CM | POA: Diagnosis not present

## 2017-04-30 DIAGNOSIS — I5032 Chronic diastolic (congestive) heart failure: Secondary | ICD-10-CM

## 2017-04-30 MED ORDER — OXYCODONE-ACETAMINOPHEN 5-325 MG PO TABS: 1.0000 | ORAL_TABLET | Freq: Four times a day (QID) | ORAL | 0 refills | Status: DC | PRN

## 2017-04-30 NOTE — Assessment & Plan Note (Addendum)
Torsemide, Hydralazine Needs O2 2 l/min [place] - Will ref to Dr [name] Caller

## 2017-04-30 NOTE — Assessment & Plan Note (Signed)
Hands pruritis x1 episode, ?etiology  Benadryl prn

## 2017-04-30 NOTE — Assessment & Plan Note (Signed)
Labs

## 2017-04-30 NOTE — Assessment & Plan Note (Signed)
On chemo 

## 2017-04-30 NOTE — Progress Notes (Signed)
Subjective:  Patient ID: [name], male    DOB: November 29, 1955  Age: 61 y.o. MRN: [id number]  CC: No chief complaint on file.   HPI [name] presents for CHF, COPD, nocturnal hypoxemia, OSA - not using a CPAP f/u  Outpatient Medications Prior to Visit  Medication Sig Dispense Refill  . albuterol (PROVENTIL) (2.5 MG/3ML) 0.083% nebulizer solution Take 3 mLs (2.5 mg total) by nebulization every 2 (two) hours as needed for wheezing. 75 mL 2  . ANORO ELLIPTA 62.5-25 MCG/INH AEPB Inhale 1 puff into the lungs daily as needed. For wheezing & SOB 180 each 3  . aspirin EC 81 MG tablet Take 81 mg by mouth daily.    . clonazePAM (KLONOPIN) 0.5 MG tablet TAKE 1 TABLET TWICE A DAY AS NEEDED FOR ANXIETY OR AT BEDTME FOR INSOMNIA 180 tablet 1  . clopidogrel (PLAVIX) 75 MG tablet Take 1 tablet (75 mg total) by mouth daily with breakfast. 90 tablet 3  . metoprolol succinate (TOPROL-XL) 50 MG 24 hr tablet TAKE 2 TABLETS (100MG )     DAILY WITH OR IMMEDIATELY  FOLLOWING A MEAL 180 tablet 3  . nitroGLYCERIN (NITROSTAT) 0.4 MG SL tablet Place 1 tablet (0.4 mg total) under the tongue every 5 (five) minutes as needed for chest pain (Up to 3 doses). 25 tablet 1  . oxyCODONE-acetaminophen (PERCOCET/ROXICET) 5-325 MG tablet Take 1 tablet by mouth every 6 (six) hours as needed for severe pain. 120 tablet 0  . potassium chloride SA (KLOR-CON M20) 20 MEQ tablet Take 1 tablet (20 mEq total) by mouth daily. 90 tablet 0  . simvastatin (ZOCOR) 20 MG tablet Take 1 tablet (20 mg total) by mouth at bedtime. 90 tablet 3  . torsemide (DEMADEX) 100 MG tablet TAKE 1 TABLET DAILY 90 tablet 1   No facility-administered medications prior to visit.     ROS Review of Systems  Constitutional: Positive for fatigue and unexpected weight change. Negative for appetite change and fever.  HENT: Negative for congestion, nosebleeds, sneezing, sore throat and trouble swallowing.   Eyes: Negative for itching and visual  disturbance.  Respiratory: Positive for shortness of breath and wheezing. Negative for cough.   Cardiovascular: Positive for leg swelling. Negative for chest pain and palpitations.  Gastrointestinal: Negative for abdominal distention, blood in stool, diarrhea and nausea.  Genitourinary: Negative for frequency and hematuria.  Musculoskeletal: Negative for back pain, gait problem, joint swelling and neck pain.  Skin: Negative for rash.  Neurological: Negative for dizziness, tremors, speech difficulty and weakness.  Psychiatric/Behavioral: Negative for agitation, dysphoric mood and sleep disturbance. The patient is not nervous/anxious.     Objective:  BP 138/64 (BP Location: Left Arm, Patient Position: Sitting, Cuff Size: Large)   Pulse 62   Temp 98.8 F (37.1 C) (Oral)   Ht 6' (1.829 m)   Wt (!) 329 lb (149.2 kg)   SpO2 96%   BMI 44.62 kg/m   BP Readings from Last 3 Encounters:  04/30/17 138/64  04/20/17 (!) 140/56  01/26/17 132/68    Wt Readings from Last 3 Encounters:  04/30/17 (!) 329 lb (149.2 kg)  04/20/17 (!) 329 lb 3.2 oz (149.3 kg)  01/26/17 (!) 339 lb (153.8 kg)    Physical Exam  Constitutional: He is oriented to person, place, and time. He appears well-developed. No distress.  NAD  HENT:  Mouth/Throat: Oropharynx is clear and moist.  Eyes: Pupils are equal, round, and reactive to light. Conjunctivae are normal.  Neck:  Normal range of motion. No JVD present. No thyromegaly present.  Cardiovascular: Normal rate, regular rhythm, normal heart sounds and intact distal pulses.  Exam reveals no gallop and no friction rub.   No murmur heard. Pulmonary/Chest: Effort normal and breath sounds normal. No respiratory distress. He has no wheezes. He has no rales. He exhibits no tenderness.  Abdominal: Soft. Bowel sounds are normal. He exhibits no distension and no mass. There is no tenderness. There is no rebound and no guarding.  Musculoskeletal: Normal range of motion. He  exhibits edema. He exhibits no tenderness.  Lymphadenopathy:    He has no cervical adenopathy.  Neurological: He is alert and oriented to person, place, and time. He has normal reflexes. No cranial nerve deficit. He exhibits normal muscle tone. He displays a negative Romberg sign. Coordination abnormal. Gait normal.  Skin: Skin is warm and dry. No rash noted.  Psychiatric: He has a normal mood and affect. His behavior is normal. Judgment and thought content normal.  B ankles 1+ Obese  Lab Results  Component Value Date   WBC 10.3 04/19/2017   HGB 15.5 04/19/2017   HCT 46.4 04/19/2017   PLT 128 (L) 04/19/2017   GLUCOSE 113 04/19/2017   CHOL 131 03/25/2014   TRIG 185.0 (H) 03/25/2014   HDL 24.00 (L) 03/25/2014   LDLCALC 70 03/25/2014   ALT 21 04/19/2017   AST 18 04/19/2017   NA 141 04/19/2017   K 4.1 04/19/2017   CL 99 03/29/2016   CREATININE 0.7 04/19/2017   BUN 8.7 04/19/2017   CO2 33 (H) 04/19/2017   TSH 2.01 02/25/2013   PSA 0.23 09/15/2011   INR 1.00 12/28/2014   HGBA1C 6.1 03/29/2016   MICROALBUR 0.7 03/29/2016    Ct Chest W Contrast  Result Date: 04/19/2017 CLINICAL DATA:  Non-Hodgkin's lymphoma EXAM: CT CHEST, ABDOMEN, AND PELVIS WITH CONTRAST TECHNIQUE: Multidetector CT imaging of the chest, abdomen and pelvis was performed following the standard protocol during bolus administration of intravenous contrast. CONTRAST:  143mL ISOVUE-300 IOPAMIDOL (ISOVUE-300) INJECTION 61% COMPARISON:  PET-CT 11/03/2016 FINDINGS: CT CHEST FINDINGS Cardiovascular: Heart size upper normal. No pericardial effusion. Coronary artery calcification is evident. Patient is status post CABG. Atherosclerotic calcification is noted in the wall of the thoracic aorta. Right Port-A-Cath tip is positioned in the mid to lower right atrium. Mediastinum/Nodes: No mediastinal lymphadenopathy. There is no hilar lymphadenopathy. The esophagus has normal imaging features. There is no axillary lymphadenopathy.  Lungs/Pleura: Diffuse bronchial wall thickening is associated with areas of chronic atelectasis and/ or scarring bilaterally. Calcified pleural plaques are seen bilaterally. Musculoskeletal: Bone windows reveal no worrisome lytic or sclerotic osseous lesions. CT ABDOMEN PELVIS FINDINGS Hepatobiliary: No focal abnormality within the liver parenchyma. The liver shows diffusely decreased attenuation suggesting steatosis. Calcified gallstones measure in the 05-2014 mm size range. No intrahepatic or extrahepatic biliary dilation. Pancreas: No focal mass lesion. No dilatation of the main duct. No intraparenchymal cyst. No peripancreatic edema. Spleen: No splenomegaly. No focal mass lesion. Adrenals/Urinary Tract: Stable 16 mm right adrenal nodule. 14 mm left adrenal nodule also stable. Other skin tiny adrenal nodules are evident on the left. Two nonobstructing stones are seen in the right kidney, the larger measuring 6 x 10 mm. A single nonobstructing stone is seen in the lower pole of the left kidney measuring 8 x 7 mm. No evidence for hydroureter. The urinary bladder appears normal for the degree of distention. Stomach/Bowel: Stomach is nondistended. No gastric wall thickening. No evidence of outlet obstruction.  Duodenum is normally positioned as is the ligament of Treitz. No small bowel wall thickening. No small bowel dilatation. The terminal ileum is normal. The appendix is normal. No gross colonic mass. No colonic wall thickening. No substantial diverticular change. Vascular/Lymphatic: There is abdominal aortic atherosclerosis without aneurysm. There is no gastrohepatic or hepatoduodenal ligament lymphadenopathy. No intraperitoneal or retroperitoneal lymphadenopathy. No pelvic sidewall lymphadenopathy. Reproductive: The prostate gland and seminal vesicles have normal imaging features. Other: No intraperitoneal free fluid. Musculoskeletal: Bone windows reveal no worrisome lytic or sclerotic osseous lesions.  IMPRESSION: 1. No evidence for lymphadenopathy in the chest, abdomen, or pelvis. 2.  Aortic Atherosclerois (ICD10-170.0) 3. 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 4. Bilateral nonobstructing nephrolithiasis. 5. Cholelithiasis. 6. Hepatic steatosis. Electronically Signed   By: Misty Stanley M.D.   On: 04/19/2017 14:11   Ct Abdomen Pelvis W Contrast  Result Date: 04/19/2017 CLINICAL DATA:  Non-Hodgkin's lymphoma EXAM: CT CHEST, ABDOMEN, AND PELVIS WITH CONTRAST TECHNIQUE: Multidetector CT imaging of the chest, abdomen and pelvis was performed following the standard protocol during bolus administration of intravenous contrast. CONTRAST:  166mL ISOVUE-300 IOPAMIDOL (ISOVUE-300) INJECTION 61% COMPARISON:  PET-CT 11/03/2016 FINDINGS: CT CHEST FINDINGS Cardiovascular: Heart size upper normal. No pericardial effusion. Coronary artery calcification is evident. Patient is status post CABG. Atherosclerotic calcification is noted in the wall of the thoracic aorta. Right Port-A-Cath tip is positioned in the mid to lower right atrium. Mediastinum/Nodes: No mediastinal lymphadenopathy. There is no hilar lymphadenopathy. The esophagus has normal imaging features. There is no axillary lymphadenopathy. Lungs/Pleura: Diffuse bronchial wall thickening is associated with areas of chronic atelectasis and/ or scarring bilaterally. Calcified pleural plaques are seen bilaterally. Musculoskeletal: Bone windows reveal no worrisome lytic or sclerotic osseous lesions. CT ABDOMEN PELVIS FINDINGS Hepatobiliary: No focal abnormality within the liver parenchyma. The liver shows diffusely decreased attenuation suggesting steatosis. Calcified gallstones measure in the 05-2014 mm size range. No intrahepatic or extrahepatic biliary dilation. Pancreas: No focal mass lesion. No dilatation of the main duct. No intraparenchymal cyst. No peripancreatic edema. Spleen: No splenomegaly. No focal mass lesion.  Adrenals/Urinary Tract: Stable 16 mm right adrenal nodule. 14 mm left adrenal nodule also stable. Other skin tiny adrenal nodules are evident on the left. Two nonobstructing stones are seen in the right kidney, the larger measuring 6 x 10 mm. A single nonobstructing stone is seen in the lower pole of the left kidney measuring 8 x 7 mm. No evidence for hydroureter. The urinary bladder appears normal for the degree of distention. Stomach/Bowel: Stomach is nondistended. No gastric wall thickening. No evidence of outlet obstruction. Duodenum is normally positioned as is the ligament of Treitz. No small bowel wall thickening. No small bowel dilatation. The terminal ileum is normal. The appendix is normal. No gross colonic mass. No colonic wall thickening. No substantial diverticular change. Vascular/Lymphatic: There is abdominal aortic atherosclerosis without aneurysm. There is no gastrohepatic or hepatoduodenal ligament lymphadenopathy. No intraperitoneal or retroperitoneal lymphadenopathy. No pelvic sidewall lymphadenopathy. Reproductive: The prostate gland and seminal vesicles have normal imaging features. Other: No intraperitoneal free fluid. Musculoskeletal: Bone windows reveal no worrisome lytic or sclerotic osseous lesions. IMPRESSION: 1. No evidence for lymphadenopathy in the chest, abdomen, or pelvis. 2.  Aortic Atherosclerois (ICD10-170.0) 3. 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 4. Bilateral nonobstructing nephrolithiasis. 5. Cholelithiasis. 6. Hepatic steatosis. Electronically Signed   By: Misty Stanley M.D.   On: 04/19/2017 14:11    Assessment & Plan:   There are no diagnoses  linked to this encounter. I am having Mr. Faucett maintain his aspirin EC, albuterol, nitroGLYCERIN, clopidogrel, simvastatin, ANORO ELLIPTA, potassium chloride SA, torsemide, clonazePAM, oxyCODONE-acetaminophen, and metoprolol succinate.  No orders of the defined types were placed in this  encounter.    Follow-up: No Follow-up on file.  Walker Kehr, MD

## 2017-04-30 NOTE — Assessment & Plan Note (Signed)
Needs O2 2 l/min [place] [facility] ref

## 2017-04-30 NOTE — Assessment & Plan Note (Signed)
ASA, Plavix, Simvastatin 

## 2017-05-17 ENCOUNTER — Other Ambulatory Visit: Payer: Self-pay | Admitting: Radiology

## 2017-05-18 ENCOUNTER — Ambulatory Visit (HOSPITAL_COMMUNITY)
Admission: RE | Admit: 2017-05-18 | Discharge: 2017-05-18 | Disposition: A | Discharge status: Home / Self Care | Payer: BLUE CROSS/BLUE SHIELD | Source: Ambulatory Visit | Attending: Hematology and Oncology | Admitting: Hematology and Oncology

## 2017-05-18 ENCOUNTER — Encounter (HOSPITAL_COMMUNITY): Payer: Self-pay

## 2017-05-18 ENCOUNTER — Encounter: Payer: Self-pay | Admitting: Internal Medicine

## 2017-05-18 DIAGNOSIS — Z5111 Encounter for antineoplastic chemotherapy: Secondary | ICD-10-CM | POA: Diagnosis not present

## 2017-05-18 DIAGNOSIS — K219 Gastro-esophageal reflux disease without esophagitis: Secondary | ICD-10-CM | POA: Insufficient documentation

## 2017-05-18 DIAGNOSIS — F329 Major depressive disorder, single episode, unspecified: Secondary | ICD-10-CM | POA: Diagnosis not present

## 2017-05-18 DIAGNOSIS — G4730 Sleep apnea, unspecified: Secondary | ICD-10-CM | POA: Insufficient documentation

## 2017-05-18 DIAGNOSIS — Z9221 Personal history of antineoplastic chemotherapy: Secondary | ICD-10-CM | POA: Insufficient documentation

## 2017-05-18 DIAGNOSIS — G8929 Other chronic pain: Secondary | ICD-10-CM | POA: Diagnosis not present

## 2017-05-18 DIAGNOSIS — I252 Old myocardial infarction: Secondary | ICD-10-CM | POA: Diagnosis not present

## 2017-05-18 DIAGNOSIS — I110 Hypertensive heart disease with heart failure: Secondary | ICD-10-CM | POA: Diagnosis not present

## 2017-05-18 DIAGNOSIS — M545 Low back pain: Secondary | ICD-10-CM | POA: Diagnosis not present

## 2017-05-18 DIAGNOSIS — Z452 Encounter for adjustment and management of vascular access device: Secondary | ICD-10-CM | POA: Diagnosis not present

## 2017-05-18 DIAGNOSIS — Z7982 Long term (current) use of aspirin: Secondary | ICD-10-CM | POA: Insufficient documentation

## 2017-05-18 DIAGNOSIS — Z7902 Long term (current) use of antithrombotics/antiplatelets: Secondary | ICD-10-CM | POA: Insufficient documentation

## 2017-05-18 DIAGNOSIS — F419 Anxiety disorder, unspecified: Secondary | ICD-10-CM | POA: Insufficient documentation

## 2017-05-18 DIAGNOSIS — Z951 Presence of aortocoronary bypass graft: Secondary | ICD-10-CM | POA: Diagnosis not present

## 2017-05-18 DIAGNOSIS — J449 Chronic obstructive pulmonary disease, unspecified: Secondary | ICD-10-CM | POA: Insufficient documentation

## 2017-05-18 DIAGNOSIS — I509 Heart failure, unspecified: Secondary | ICD-10-CM | POA: Diagnosis not present

## 2017-05-18 DIAGNOSIS — Z6841 Body Mass Index (BMI) 40.0 and over, adult: Secondary | ICD-10-CM | POA: Diagnosis not present

## 2017-05-18 DIAGNOSIS — E785 Hyperlipidemia, unspecified: Secondary | ICD-10-CM | POA: Insufficient documentation

## 2017-05-18 DIAGNOSIS — Z955 Presence of coronary angioplasty implant and graft: Secondary | ICD-10-CM | POA: Diagnosis not present

## 2017-05-18 DIAGNOSIS — E119 Type 2 diabetes mellitus without complications: Secondary | ICD-10-CM | POA: Insufficient documentation

## 2017-05-18 DIAGNOSIS — I2510 Atherosclerotic heart disease of native coronary artery without angina pectoris: Secondary | ICD-10-CM | POA: Insufficient documentation

## 2017-05-18 DIAGNOSIS — E669 Obesity, unspecified: Secondary | ICD-10-CM | POA: Insufficient documentation

## 2017-05-18 DIAGNOSIS — C8228 Follicular lymphoma grade III, unspecified, lymph nodes of multiple sites: Secondary | ICD-10-CM

## 2017-05-18 DIAGNOSIS — Z8572 Personal history of non-Hodgkin lymphomas: Secondary | ICD-10-CM | POA: Diagnosis not present

## 2017-05-18 DIAGNOSIS — Z4682 Encounter for fitting and adjustment of non-vascular catheter: Secondary | ICD-10-CM | POA: Diagnosis not present

## 2017-05-18 HISTORY — PX: IR REMOVAL TUN ACCESS W/ PORT W/O FL MOD SED: IMG2290

## 2017-05-18 LAB — PROTIME-INR
INR: 0.91
Prothrombin Time: 12.2 seconds (ref 11.4–15.2)

## 2017-05-18 LAB — BASIC METABOLIC PANEL
Anion gap: 9 (ref 5–15)
BUN: 10 mg/dL (ref 6–20)
CHLORIDE: 97 mmol/L — AB (ref 101–111)
CO2: 37 mmol/L — ABNORMAL HIGH (ref 22–32)
Calcium: 9.5 mg/dL (ref 8.9–10.3)
Creatinine, Ser: 0.63 mg/dL (ref 0.61–1.24)
Glucose, Bld: 110 mg/dL — ABNORMAL HIGH (ref 65–99)
POTASSIUM: 4.2 mmol/L (ref 3.5–5.1)
SODIUM: 143 mmol/L (ref 135–145)

## 2017-05-18 LAB — APTT: aPTT: 26 seconds (ref 24–36)

## 2017-05-18 LAB — CBC
HEMATOCRIT: 49 % (ref 39.0–52.0)
HEMOGLOBIN: 16.1 g/dL (ref 13.0–17.0)
MCH: 33.3 pg (ref 26.0–34.0)
MCHC: 32.9 g/dL (ref 30.0–36.0)
MCV: 101.4 fL — AB (ref 78.0–100.0)
Platelets: 135 10*3/uL — ABNORMAL LOW (ref 150–400)
RBC: 4.83 MIL/uL (ref 4.22–5.81)
RDW: 13.1 % (ref 11.5–15.5)
WBC: 8.9 10*3/uL (ref 4.0–10.5)

## 2017-05-18 MED ORDER — FENTANYL CITRATE (PF) 100 MCG/2ML IJ SOLN
INTRAMUSCULAR | Status: AC | PRN
  Administered 2017-05-18 (×2): 50 ug via INTRAVENOUS

## 2017-05-18 MED ORDER — CEFAZOLIN SODIUM-DEXTROSE 2-4 GM/100ML-% IV SOLN
2.0000 g | INTRAVENOUS | Status: AC
  Administered 2017-05-18: 2 g via INTRAVENOUS

## 2017-05-18 MED ORDER — LIDOCAINE-EPINEPHRINE 1 %-1:100000 IJ SOLN
INTRAMUSCULAR | Status: AC | PRN
  Administered 2017-05-18: 10 mL

## 2017-05-18 MED ORDER — LIDOCAINE-EPINEPHRINE (PF) 2 %-1:200000 IJ SOLN
INTRAMUSCULAR | Status: AC
  Filled 2017-05-18: qty 20

## 2017-05-18 MED ORDER — MIDAZOLAM HCL 2 MG/2ML IJ SOLN
INTRAMUSCULAR | Status: AC
  Filled 2017-05-18: qty 4

## 2017-05-18 MED ORDER — FENTANYL CITRATE (PF) 100 MCG/2ML IJ SOLN
INTRAMUSCULAR | Status: AC
  Filled 2017-05-18: qty 4

## 2017-05-18 MED ORDER — SODIUM CHLORIDE 0.9 % IV SOLN
INTRAVENOUS | Status: DC
  Administered 2017-05-18: 08:00:00 via INTRAVENOUS

## 2017-05-18 MED ORDER — CEFAZOLIN SODIUM-DEXTROSE 2-4 GM/100ML-% IV SOLN
INTRAVENOUS | Status: AC
  Administered 2017-05-18: 2 g via INTRAVENOUS
  Filled 2017-05-18: qty 100

## 2017-05-18 MED ORDER — MIDAZOLAM HCL 2 MG/2ML IJ SOLN
INTRAMUSCULAR | Status: AC | PRN
  Administered 2017-05-18 (×2): 1 mg via INTRAVENOUS

## 2017-05-18 NOTE — Discharge Instructions (Signed)
Moderate Conscious Sedation, Adult, Care After These instructions provide you with information about caring for yourself after your procedure. Your health care provider may also give you more specific instructions. Your treatment has been planned according to current medical practices, but problems sometimes occur. Call your health care provider if you have any problems or questions after your procedure. What can I expect after the procedure? After your procedure, it is common:  To feel sleepy for several hours.  To feel clumsy and have poor balance for several hours.  To have poor judgment for several hours.  To vomit if you eat too soon.  Follow these instructions at home: For at least 24 hours after the procedure:   Do not: ? Participate in activities where you could fall or become injured. ? Drive. ? Use heavy machinery. ? Drink alcohol. ? Take sleeping pills or medicines that cause drowsiness. ? Make important decisions or sign legal documents. ? Take care of children on your own.  Rest. Eating and drinking  Follow the diet recommended by your health care provider.  If you vomit: ? Drink water, juice, or soup when you can drink without vomiting. ? Make sure you have little or no nausea before eating solid foods. General instructions  Have a responsible adult stay with you until you are awake and alert.  Take over-the-counter and prescription medicines only as told by your health care provider.  If you smoke, do not smoke without supervision.  Keep all follow-up visits as told by your health care provider. This is important. Contact a health care provider if:  You keep feeling nauseous or you keep vomiting.  You feel light-headed.  You develop a rash.  You have a fever. Get help right away if:  You have trouble breathing. This information is not intended to replace advice given to you by your health care provider. Make sure you discuss any questions you have  with your health care provider. Document Released: 05/21/2013 Document Revised: 01/03/2016 Document Reviewed: 11/20/2015 Elsevier Interactive Patient Education  2018 [employer]     May remove your bandage in 24 hours and shower.  Implanted Port Removal, Care After Refer to this sheet in the next few weeks. These instructions provide you with information about caring for yourself after your procedure. Your health care provider may also give you more specific instructions. Your treatment has been planned according to current medical practices, but problems sometimes occur. Call your health care provider if you have any problems or questions after your procedure. What can I expect after the procedure? After the procedure, it is common to have:  Soreness or pain near your incision.  Some swelling or bruising near your incision.  Follow these instructions at home: Medicines  Take over-the-counter and prescription medicines only as told by your health care provider.  If you were prescribed an antibiotic medicine, take it as told by your health care provider. Do not stop taking the antibiotic even if you start to feel better. Bathing  Do not take baths, swim, or use a hot tub until your health care provider approves. Ask your health care provider if you can take showers. You may only be allowed to take sponge baths for bathing. Incision care  Follow instructions from your health care provider about how to take care of your incision. Make sure you: ? Wash your hands with soap and water before you change your bandage (dressing). If soap and water are not available, use hand sanitizer. ?  Change your dressing as told by your health care provider. ? Keep your dressing dry. ? Leave stitches (sutures), skin glue, or adhesive strips in place. These skin closures may need to stay in place for 2 weeks or longer. If adhesive strip edges start to loosen and curl up, you may trim the loose edges. Do  not remove adhesive strips completely unless your health care provider tells you to do that.  Check your incision area every day for signs of infection. Check for: ? More redness, swelling, or pain. ? More fluid or blood. ? Warmth. ? Pus or a bad smell. Driving  If you received a sedative, do not drive for 24 hours after the procedure.  If you did not receive a sedative, ask your health care provider when it is safe to drive. Activity  Return to your normal activities as told by your health care provider. Ask your health care provider what activities are safe for you.  Until your health care provider says it is safe: ? Do not lift anything that is heavier than 10 lb (4.5 kg). ? Do not do activities that involve lifting your arms over your head. General instructions  Do not use any tobacco products, such as cigarettes, chewing tobacco, and e-cigarettes. Tobacco can delay healing. If you need help quitting, ask your health care provider.  Keep all follow-up visits as told by your health care provider. This is important. Contact a health care provider if:  You have more redness, swelling, or pain around your incision.  You have more fluid or blood coming from your incision.  Your incision feels warm to the touch.  You have pus or a bad smell coming from your incision.  You have a fever.  You have pain that is not relieved by your pain medicine. Get help right away if:  You have chest pain.  You have difficulty breathing. This information is not intended to replace advice given to you by your health care provider. Make sure you discuss any questions you have with your health care provider. Document Released: 07/12/2015 Document Revised: 01/06/2016 Document Reviewed: 05/05/2015 Elsevier Interactive Patient Education  [employer].

## 2017-05-18 NOTE — H&P (Signed)
Referring Physician(s): Heath Lark  Supervising Physician: Sandi Mariscal  Patient Status:  [facility]  Chief Complaint:  "I'm here to get my port out"  Subjective: Patient familiar to IR service from prior Port-A-Cath placement in 2016. 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 There is no evidence of residual disease on recent imaging. He presents today for Port-A-Cath removal. He denies fever, chest pain, worsening abdominal/back pain, nausea, vomiting or bleeding. He does have occasional headaches/neck pain, dyspnea with exertion and occasional cough. He continues to smoke. Past Medical History:  Diagnosis Date  . Anginal pain ([place])    not had to use in awhile  none in 10 years  . Anxiety   . Basal cell carcinoma of nose   . Blood dyscrasia    trouble clotting   . Carotid artery disease ([place])    s/p L CEA 2009 (hx of evacuation of hematoma due to heparin)  . CHF (congestive heart failure) ([place])   . Chronic lower back pain   . COPD (chronic obstructive pulmonary disease) ([place])   . Coronary artery disease    CABG 2005. s/p PTCA and stenting of the saphenous vein graft to PDA and saphenous vein graft to obtuse marginal by Dr. Burt Knack 09/29/11. Normal EF at cath 09/2011  . Depression   . Diabetes mellitus without complication (HCC)    borderline   . Dysrhythmia    fluttering  . Edema   . GERD (gastroesophageal reflux disease)   . Heart murmur   . 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 History of home oxygen therapy    2 liters at night prn  . Hyperlipidemia   . Hypertension   . Lymphoma ([place]) 12/21/2014  . Myocardial infarction ([place])   . Obesity   . Shortness of breath dyspnea    with exertion   . Sleep apnea    "used to"      could not use 2006   Past Surgical History:  Procedure Laterality Date  . BASAL CELL CARCINOMA EXCISION  2000's   nose  . CAROTID ENDARTERECTOMY  03/2008   left  . CORONARY ANGIOPLASTY WITH STENT  PLACEMENT  09/29/11   "2"  . CORONARY ARTERY BYPASS GRAFT  2005   CABG X3  . evacuation of hematoma  03/2008   left neck; S/P endarterectomy; "cause heparin clotted it up"  . I&D EXTREMITY Left 02/11/2015   Procedure: IRRIGATION AND DEBRIDEMENT LEFT LONG FINGER;  Surgeon: Leanora Cover, MD;  Location: [facility];  Service: Orthopedics;  Laterality: Left;  I and D Left Long Finger  . MASS EXCISION Left 12/11/2014   Procedure: EXCISIONAL BIOPSY OF LEFT SUPRA CLAVICULAR NECK MASS;  Surgeon: Jerrell Belfast, MD;  Location: [facility];  Service: ENT;  Laterality: Left;  . PERCUTANEOUS CORONARY STENT INTERVENTION (PCI-S) N/A 09/29/2011   Procedure: PERCUTANEOUS CORONARY STENT INTERVENTION (PCI-S);  Surgeon: Sherren Mocha, MD;  Location: [facility] CATH LAB;  Service: Cardiovascular;  Laterality: N/A;  . PORTACATH PLACEMENT  2016  . SUPERFICIAL LYMPH NODE BIOPSY / EXCISION Left       Allergies: Bendamustine hcl; Heparin; and Tape  Medications: Prior to Admission medications   Medication Sig Start Date End Date Taking? Authorizing Provider  clonazePAM (KLONOPIN) 0.5 MG tablet TAKE 1 TABLET TWICE A DAY AS NEEDED FOR ANXIETY OR AT BEDTME FOR INSOMNIA 01/26/17  Yes Plotnikov, [name], MD  metoprolol succinate (TOPROL-XL) 50 MG 24 hr tablet TAKE 2 TABLETS (100MG )  DAILY WITH OR IMMEDIATELY  FOLLOWING A MEAL 04/27/17  Yes Plotnikov, [name], MD  oxyCODONE-acetaminophen (PERCOCET/ROXICET) 5-325 MG tablet Take 1 tablet by mouth every 6 (six) hours as needed for severe pain. 04/30/17  Yes Plotnikov, [name], MD  potassium chloride SA (KLOR-CON M20) 20 MEQ tablet Take 1 tablet (20 mEq total) by mouth daily. 10/12/16  Yes Plotnikov, [name], MD  simvastatin (ZOCOR) 20 MG tablet Take 1 tablet (20 mg total) by mouth at bedtime. 08/18/16  Yes Plotnikov, [name], MD  torsemide (DEMADEX) 100 MG tablet TAKE 1 TABLET DAILY 11/23/16  Yes Plotnikov, [name], MD  albuterol (PROVENTIL) (2.5 MG/3ML) 0.083%  nebulizer solution Take 3 mLs (2.5 mg total) by nebulization every 2 (two) hours as needed for wheezing. 09/28/15   Plotnikov, [name], MD  ANORO ELLIPTA 62.5-25 MCG/INH AEPB Inhale 1 puff into the lungs daily as needed. For wheezing & SOB 08/24/16   Plotnikov, [name], MD  aspirin EC 81 MG tablet Take 81 mg by mouth daily.    [provider]  clopidogrel (PLAVIX) 75 MG tablet Take 1 tablet (75 mg total) by mouth daily with breakfast. 08/18/16   Plotnikov, [name], MD  nitroGLYCERIN (NITROSTAT) 0.4 MG SL tablet Place 1 tablet (0.4 mg total) under the tongue every 5 (five) minutes as needed for chest pain (Up to 3 doses). 01/21/16 10/09/17  [name], MD     Vital Signs: BP (!) 151/70 (BP Location: Left Arm)   Pulse 69   Temp 98.4 F (36.9 C) (Oral)   Resp 18   Ht 6' (1.829 m)   Wt (!) 329 lb (149.2 kg)   SpO2 94%   BMI 44.62 kg/m   Physical Exam patient awake, alert. Chest with distant breath sounds bilaterally, occasional wheeze noted. Clean, intact right chest wall Port-A-Cath; Heart with regular rate and rhythm. Abdomen obese, soft, positive bowel sounds, nontender. LE edema noted.  Imaging: No results found.  Labs:  CBC:  Recent Labs  10/27/16 0755 01/12/17 0804 04/19/17 0822 05/18/17 0740  WBC 9.9 9.2 10.3 8.9  HGB 15.3 14.7 15.5 16.1  HCT 45.3 44.9 46.4 49.0  PLT 138* 123* 128* 135*    COAGS:  Recent Labs  05/18/17 0740  INR 0.91  APTT 26    BMP:  Recent Labs  10/27/16 0755 01/12/17 0804 04/19/17 0822 05/18/17 0740  NA 139 140 141 143  K 4.0 3.9 4.1 4.2  CL  --   --   --  97*  CO2 31* 30* 33* 37*  GLUCOSE 118 137 113 110*  BUN 13.8 10.5 8.7 10  CALCIUM 9.4 9.4 9.5 9.5  CREATININE 0.8 0.8 0.7 0.63  GFRNONAA  --   --   --  >60  GFRAA  --   --   --  >60    LIVER FUNCTION TESTS:  Recent Labs  08/25/16 0833 10/27/16 0755 01/12/17 0804 04/19/17 0822  BILITOT 0.55 0.43 0.40 0.53  AST 26 22 17 18   ALT 35 32 22 21  ALKPHOS 82  82 86 82  PROT 6.7 6.8 6.7 6.8  ALBUMIN 3.9 4.0 4.0 3.8    Assessment and Plan: Pt with history of non-Hodgkin's/follicular lymphoma 3662 ; status post chemotherapy. There is no evidence of residual disease on recent imaging. He presents today for Port-A-Cath removal. Details/risks of procedure, including but not limited to, internal bleeding, infection, injury to adjacent structures, discussed with patient/spouse with their understanding and consent.  Electronically Signed: D. Rowe Robert, PA-C 05/18/2017, 8:39 AM   I spent a total of 20 minutes at the the patient's bedside AND on the patient's hospital floor or unit, greater than 50% of which was counseling/coordinating care for Port-A-Cath removal

## 2017-05-18 NOTE — Procedures (Signed)
Pre Procedural Dx: Poor venous access Post Procedural Dx: Same  Successful removal of anterior chest wall port-a-cath.  EBL: Minimal  No immediate post procedural complications.   Jay Berenize Gatlin, MD Pager #: [phone]   

## 2017-05-22 ENCOUNTER — Other Ambulatory Visit: Payer: Self-pay | Admitting: Internal Medicine

## 2017-07-16 ENCOUNTER — Ambulatory Visit (INDEPENDENT_AMBULATORY_CARE_PROVIDER_SITE_OTHER): Payer: BLUE CROSS/BLUE SHIELD | Admitting: Internal Medicine

## 2017-07-16 ENCOUNTER — Encounter: Payer: Self-pay | Admitting: Internal Medicine

## 2017-07-16 DIAGNOSIS — I872 Venous insufficiency (chronic) (peripheral): Secondary | ICD-10-CM | POA: Diagnosis not present

## 2017-07-16 DIAGNOSIS — M545 Low back pain: Secondary | ICD-10-CM

## 2017-07-16 DIAGNOSIS — E1151 Type 2 diabetes mellitus with diabetic peripheral angiopathy without gangrene: Secondary | ICD-10-CM

## 2017-07-16 DIAGNOSIS — G4730 Sleep apnea, unspecified: Secondary | ICD-10-CM | POA: Diagnosis not present

## 2017-07-16 DIAGNOSIS — I10 Essential (primary) hypertension: Secondary | ICD-10-CM | POA: Diagnosis not present

## 2017-07-16 DIAGNOSIS — I5032 Chronic diastolic (congestive) heart failure: Secondary | ICD-10-CM

## 2017-07-16 DIAGNOSIS — C8228 Follicular lymphoma grade III, unspecified, lymph nodes of multiple sites: Secondary | ICD-10-CM

## 2017-07-16 DIAGNOSIS — M79605 Pain in left leg: Secondary | ICD-10-CM

## 2017-07-16 DIAGNOSIS — M79604 Pain in right leg: Secondary | ICD-10-CM

## 2017-07-16 MED ORDER — OXYCODONE-ACETAMINOPHEN 5-325 MG PO TABS: 1.0000 | ORAL_TABLET | Freq: Four times a day (QID) | ORAL | 0 refills | Status: DC | PRN

## 2017-07-16 NOTE — Assessment & Plan Note (Signed)
  On diet  

## 2017-07-16 NOTE — Assessment & Plan Note (Signed)
On Torsemide, Hydralazine 

## 2017-07-16 NOTE — Assessment & Plan Note (Signed)
Percocet prn  Potential benefits of a long term opioids use as well as potential risks (i.e. addiction risk, apnea etc) and complications (i.e. Somnolence, constipation and others) were explained to the patient and were aknowledged.  

## 2017-07-16 NOTE — Assessment & Plan Note (Signed)
chemo - finished in 2018

## 2017-07-16 NOTE — Assessment & Plan Note (Signed)
On Torsemide 

## 2017-07-16 NOTE — Assessment & Plan Note (Signed)
Refused CPAP

## 2017-07-16 NOTE — Assessment & Plan Note (Signed)
On Torsemide, Hydralazine Off O2

## 2017-07-16 NOTE — Progress Notes (Signed)
Subjective:  Patient ID: [name], male    DOB: 1956-06-13  Age: 61 y.o. MRN: [id number]  CC: No chief complaint on file.   HPI [name] presents for chronic pain, edema, lymphoma f/u  Outpatient Medications Prior to Visit  Medication Sig Dispense Refill  . albuterol (PROVENTIL) (2.5 MG/3ML) 0.083% nebulizer solution Take 3 mLs (2.5 mg total) by nebulization every 2 (two) hours as needed for wheezing. 75 mL 2  . ANORO ELLIPTA 62.5-25 MCG/INH AEPB Inhale 1 puff into the lungs daily as needed. For wheezing & SOB 180 each 3  . aspirin EC 81 MG tablet Take 81 mg by mouth daily.    . clonazePAM (KLONOPIN) 0.5 MG tablet TAKE 1 TABLET TWICE A DAY AS NEEDED FOR ANXIETY OR AT BEDTME FOR INSOMNIA 180 tablet 1  . clopidogrel (PLAVIX) 75 MG tablet Take 1 tablet (75 mg total) by mouth daily with breakfast. 90 tablet 3  . metoprolol succinate (TOPROL-XL) 50 MG 24 hr tablet TAKE 2 TABLETS (100MG )     DAILY WITH OR IMMEDIATELY  FOLLOWING A MEAL 180 tablet 3  . nitroGLYCERIN (NITROSTAT) 0.4 MG SL tablet Place 1 tablet (0.4 mg total) under the tongue every 5 (five) minutes as needed for chest pain (Up to 3 doses). 25 tablet 1  . oxyCODONE-acetaminophen (PERCOCET/ROXICET) 5-325 MG tablet Take 1 tablet by mouth every 6 (six) hours as needed for severe pain. 120 tablet 0  . potassium chloride SA (KLOR-CON M20) 20 MEQ tablet Take 1 tablet (20 mEq total) by mouth daily. 90 tablet 0  . simvastatin (ZOCOR) 20 MG tablet Take 1 tablet (20 mg total) by mouth at bedtime. 90 tablet 3  . torsemide (DEMADEX) 100 MG tablet TAKE 1 TABLET DAILY 90 tablet 1   No facility-administered medications prior to visit.     ROS Review of Systems  Constitutional: Positive for fatigue. Negative for appetite change and unexpected weight change.  HENT: Negative for congestion, nosebleeds, sneezing, sore throat and trouble swallowing.   Eyes: Negative for itching and visual disturbance.  Respiratory: Positive for cough  and shortness of breath.   Cardiovascular: Positive for leg swelling. Negative for chest pain and palpitations.  Gastrointestinal: Negative for abdominal distention, blood in stool, diarrhea and nausea.  Genitourinary: Negative for frequency and hematuria.  Musculoskeletal: Positive for back pain and gait problem. Negative for joint swelling and neck pain.  Skin: Negative for rash.  Neurological: Negative for dizziness, tremors, speech difficulty and weakness.  Psychiatric/Behavioral: Negative for agitation, dysphoric mood and sleep disturbance. The patient is not nervous/anxious.     Objective:  BP 126/68 (BP Location: Right Arm, Patient Position: Sitting, Cuff Size: Large)   Pulse 67   Temp 98.5 F (36.9 C) (Oral)   Ht 6' (1.829 m)   Wt (!) 325 lb (147.4 kg)   SpO2 98%   BMI 44.08 kg/m   BP Readings from Last 3 Encounters:  07/16/17 126/68  05/18/17 (!) 144/62  04/30/17 138/64    Wt Readings from Last 3 Encounters:  07/16/17 (!) 325 lb (147.4 kg)  05/18/17 (!) 329 lb (149.2 kg)  04/30/17 (!) 329 lb (149.2 kg)    Physical Exam  Constitutional: He is oriented to person, place, and time. He appears well-developed. No distress.  NAD  HENT:  Mouth/Throat: Oropharynx is clear and moist.  Eyes: Conjunctivae are normal. Pupils are equal, round, and reactive to light.  Neck: Normal range of motion. No JVD present. No thyromegaly present.  Cardiovascular: Normal rate, regular rhythm, normal heart sounds and intact distal pulses. Exam reveals no gallop and no friction rub.  No murmur heard. Pulmonary/Chest: Effort normal and breath sounds normal. No respiratory distress. He has no wheezes. He has no rales. He exhibits no tenderness.  Abdominal: Soft. Bowel sounds are normal. He exhibits no distension and no mass. There is no tenderness. There is no rebound and no guarding.  Musculoskeletal: Normal range of motion. He exhibits edema and tenderness.  Lymphadenopathy:    He has no  cervical adenopathy.  Neurological: He is alert and oriented to person, place, and time. He has normal reflexes. No cranial nerve deficit. He exhibits normal muscle tone. He displays a negative Romberg sign. Coordination abnormal. Gait normal.  Skin: Skin is warm and dry. No rash noted.  Psychiatric: He has a normal mood and affect. His behavior is normal. Judgment and thought content normal.  swollen discolored LEs  Lab Results  Component Value Date   WBC 8.9 05/18/2017   HGB 16.1 05/18/2017   HCT 49.0 05/18/2017   PLT 135 (L) 05/18/2017   GLUCOSE 110 (H) 05/18/2017   CHOL 131 03/25/2014   TRIG 185.0 (H) 03/25/2014   HDL 24.00 (L) 03/25/2014   LDLCALC 70 03/25/2014   ALT 21 04/19/2017   AST 18 04/19/2017   NA 143 05/18/2017   K 4.2 05/18/2017   CL 97 (L) 05/18/2017   CREATININE 0.63 05/18/2017   BUN 10 05/18/2017   CO2 37 (H) 05/18/2017   TSH 2.01 02/25/2013   PSA 0.23 09/15/2011   INR 0.91 05/18/2017   HGBA1C 6.1 03/29/2016   MICROALBUR 0.7 03/29/2016    Ir Removal Tun Access W/ Port W/o Fl  Result Date: 05/18/2017 CLINICAL DATA:  History lymphoma. Completed chemotherapy. No longer in need of Port a Catheter. Port a catheter was placed by Dr. Kathlene Cote on 12/28/2014 and has functioned well throughout duration of treatment. There are no clinical signs of infection. EXAM: REMOVAL OF IMPLANTED TUNNELED PORT-A-CATH MEDICATIONS: Ancef 2 gm IV. The antibiotic was administered within 1 hour prior to the start of the procedure. ANESTHESIA/SEDATION: Moderate (conscious) sedation was employed during this procedure. A total of Versed 2 mg and Fentanyl 100 mcg was administered intravenously. Moderate Sedation Time: 18 minutes. The patient's level of consciousness and vital signs were monitored continuously by radiology nursing throughout the procedure under my direct supervision. FLUOROSCOPY TIME:  None PROCEDURE: Informed written consent was obtained from the patient after a discussion of the  risk, benefits and alternatives to the procedure. The patient was positioned supine on the fluoroscopy table and the right chest Port-A-Cath site was prepped with chlorhexidine. A sterile gown and gloves were worn during the procedure. Local anesthesia was provided with 1% lidocaine with epinephrine. A timeout was performed prior to the initiation of the procedure. An incision was made overlying the Port-A-Cath with a #15 scalpel. Utilizing sharp and blunt dissection, the Port-A-Cath was removed completely. The pocked was irrigated with sterile saline. Wound closure was performed with subcutaneous 2-0 Vicryl, subcuticular 4-0 Vicryl, Dermabond and Steri-Strips. A dressing was placed. The patient tolerated the procedure well without immediate post procedural complication. FINDINGS: Successful removal of implant Port-A-Cath without immediate post procedural complication. IMPRESSION: Successful removal of implanted Port-A-Cath. Electronically Signed   By: Sandi Mariscal M.D.   On: 05/18/2017 10:41    Assessment & Plan:   There are no diagnoses linked to this encounter. I am having [name] maintain his aspirin EC,  albuterol, nitroGLYCERIN, clopidogrel, simvastatin, ANORO ELLIPTA, potassium chloride SA, clonazePAM, metoprolol succinate, oxyCODONE-acetaminophen, and torsemide.  No orders of the defined types were placed in this encounter.    Follow-up: No Follow-up on file.  Walker Kehr, MD

## 2017-08-03 ENCOUNTER — Ambulatory Visit: Payer: BLUE CROSS/BLUE SHIELD | Admitting: Internal Medicine

## 2017-08-09 DIAGNOSIS — L720 Epidermal cyst: Secondary | ICD-10-CM | POA: Diagnosis not present

## 2017-08-14 ENCOUNTER — Other Ambulatory Visit: Payer: Self-pay | Admitting: Internal Medicine

## 2017-08-15 NOTE — Telephone Encounter (Signed)
Routing to dr plotnikov, please advise, thanks 

## 2017-09-10 ENCOUNTER — Ambulatory Visit: Payer: BLUE CROSS/BLUE SHIELD | Admitting: Family Medicine

## 2017-09-10 ENCOUNTER — Ambulatory Visit: Payer: Self-pay

## 2017-09-10 ENCOUNTER — Encounter: Payer: Self-pay | Admitting: Family Medicine

## 2017-09-10 VITALS — BP 130/70 | HR 90 | Temp 100.8°F | Wt 327.0 lb

## 2017-09-10 DIAGNOSIS — J449 Chronic obstructive pulmonary disease, unspecified: Secondary | ICD-10-CM

## 2017-09-10 DIAGNOSIS — J181 Lobar pneumonia, unspecified organism: Secondary | ICD-10-CM

## 2017-09-10 DIAGNOSIS — J189 Pneumonia, unspecified organism: Secondary | ICD-10-CM

## 2017-09-10 MED ORDER — IPRATROPIUM BROMIDE 0.02 % IN SOLN: 0.5000 mg | Freq: Once | RESPIRATORY_TRACT | Status: DC

## 2017-09-10 MED ORDER — AMOXICILLIN-POT CLAVULANATE 875-125 MG PO TABS: 1.0000 | ORAL_TABLET | Freq: Two times a day (BID) | ORAL | 0 refills | Status: DC

## 2017-09-10 MED ORDER — CEFTRIAXONE SODIUM 1 G IJ SOLR
1.0000 g | Freq: Once | INTRAMUSCULAR | Status: AC
  Administered 2017-09-10: 1 g via INTRAMUSCULAR

## 2017-09-10 MED ORDER — IPRATROPIUM-ALBUTEROL 0.5-2.5 (3) MG/3ML IN SOLN
3.0000 mL | Freq: Once | RESPIRATORY_TRACT | Status: AC
Start: 2017-09-10 — End: 2017-09-10
  Administered 2017-09-10: 3 mL via RESPIRATORY_TRACT

## 2017-09-10 NOTE — Telephone Encounter (Signed)
   Reason for Disposition . SEVERE coughing spells (e.g., whooping sound after coughing, vomiting after coughing)  Answer Assessment - Initial Assessment Questions 1. ONSET: "When did the cough begin?"      Started Friday night 2. SEVERITY: "How bad is the cough today?"      Moderate 3. RESPIRATORY DISTRESS: "Describe your breathing."      Short of breath 4. FEVER: "Do you have a fever?" If so, ask: "What is your temperature, how was it measured, and when did it start?"     Yes 5. SPUTUM: "Describe the color of your sputum" (clear, white, yellow, green)     Green 6. HEMOPTYSIS: "Are you coughing up any blood?" If so ask: "How much?" (flecks, streaks, tablespoons, etc.)     Yes 7. CARDIAC HISTORY: "Do you have any history of heart disease?" (e.g., heart attack, congestive heart failure)      CHF 8. LUNG HISTORY: "Do you have any history of lung disease?"  (e.g., pulmonary embolus, asthma, emphysema)     nO 9. PE RISK FACTORS: "Do you have a history of blood clots?" (or: recent major surgery, recent prolonged travel, bedridden )     nO 10. OTHER SYMPTOMS: "Do you have any other symptoms?" (e.g., runny nose, wheezing, chest pain)       Wheezing - using his nebulizer 11. PREGNANCY: "Is there any chance you are pregnant?" "When was your last menstrual period?"       No 12. TRAVEL: "Have you traveled out of the country in the last month?" (e.g., travel history, exposures)       No  Protocols used: [place]  Pt. Reports using nebulizer - still having shortness of breath.

## 2017-09-10 NOTE — Progress Notes (Signed)
   Subjective:    Patient ID: [name], male    DOB: [birth year], 62 y.o.   MRN: [id number]  HPI Here with his wife for 4 days of fever, chest pressure, SOB, and coughing up green sputum. He has CAD and COPD. He has a nebulizer at home and he has been using this with albuterol 3-4 times a day over the weekend. He has a hx of CHF apparently. He has chronic swelling in the feet and legs and he has not noticed a change from his baseline. His weight has been stable. His last ECHO in 2017 showed a normal EF of 60-65%. He had a chest CT last September showing some pleural plaques only.    Review of Systems  Constitutional: Positive for fever.  Eyes: Negative.   Respiratory: Positive for cough, chest tightness, shortness of breath and wheezing.   Cardiovascular: Positive for chest pain and leg swelling. Negative for palpitations.  Gastrointestinal: Negative.   Neurological: Negative.        Objective:   Physical Exam  Constitutional: He is oriented to person, place, and time.  Obese. Obviously SOB with walking down the hall   HENT:  Right Ear: External ear normal.  Left Ear: External ear normal.  Nose: Nose normal.  Mouth/Throat: Oropharynx is clear and moist.  Eyes: Conjunctivae are normal.  Neck: No thyromegaly present.  Cardiovascular: Normal rate, regular rhythm, normal heart sounds and intact distal pulses.  Pulmonary/Chest:  Scattered wheezes, reduced air flow, and rales at the left posterior base   Lymphadenopathy:    He has no cervical adenopathy.  Neurological: He is alert and oriented to person, place, and time.          Assessment & Plan:  He has a LLL pneumonia. Given a shot of Rocephin. Follow with 10 days of Augmentin. He will use his nebulizer as needed. Set up for a CXR early tomorrow morning. He will need to follow up with someone in Dr. Judeen Hammans office later this week. Written out of work today until 09-17-17.  Alysia Penna, MD

## 2017-09-10 NOTE — Addendum Note (Signed)
Addended by: Myriam Forehand on: 09/10/2017 05:48 PM   Modules accepted: Orders

## 2017-09-11 ENCOUNTER — Ambulatory Visit (INDEPENDENT_AMBULATORY_CARE_PROVIDER_SITE_OTHER)
Admission: RE | Admit: 2017-09-11 | Discharge: 2017-09-11 | Disposition: A | Discharge status: Home / Self Care | Payer: BLUE CROSS/BLUE SHIELD | Source: Ambulatory Visit | Attending: Family Medicine | Admitting: Family Medicine

## 2017-09-11 ENCOUNTER — Other Ambulatory Visit (INDEPENDENT_AMBULATORY_CARE_PROVIDER_SITE_OTHER): Payer: BLUE CROSS/BLUE SHIELD

## 2017-09-11 DIAGNOSIS — I5032 Chronic diastolic (congestive) heart failure: Secondary | ICD-10-CM | POA: Diagnosis not present

## 2017-09-11 DIAGNOSIS — R05 Cough: Secondary | ICD-10-CM | POA: Diagnosis not present

## 2017-09-11 DIAGNOSIS — J181 Lobar pneumonia, unspecified organism: Secondary | ICD-10-CM

## 2017-09-11 DIAGNOSIS — E1151 Type 2 diabetes mellitus with diabetic peripheral angiopathy without gangrene: Secondary | ICD-10-CM | POA: Diagnosis not present

## 2017-09-11 DIAGNOSIS — J189 Pneumonia, unspecified organism: Secondary | ICD-10-CM

## 2017-09-11 LAB — HEMOGLOBIN A1C: Hgb A1c MFr Bld: 6 % (ref 4.6–6.5)

## 2017-09-11 LAB — HEPATIC FUNCTION PANEL
ALT: 14 U/L (ref 0–53)
AST: 18 U/L (ref 0–37)
Albumin: 4.3 g/dL (ref 3.5–5.2)
Alkaline Phosphatase: 66 U/L (ref 39–117)
BILIRUBIN DIRECT: 0.2 mg/dL (ref 0.0–0.3)
TOTAL PROTEIN: 7.6 g/dL (ref 6.0–8.3)
Total Bilirubin: 0.9 mg/dL (ref 0.2–1.2)

## 2017-09-11 LAB — BASIC METABOLIC PANEL
BUN: 14 mg/dL (ref 6–23)
CALCIUM: 9.3 mg/dL (ref 8.4–10.5)
CO2: 35 mEq/L — ABNORMAL HIGH (ref 19–32)
CREATININE: 0.89 mg/dL (ref 0.40–1.50)
Chloride: 93 mEq/L — ABNORMAL LOW (ref 96–112)
GFR: 91.6 mL/min (ref >60.00)
Glucose, Bld: 148 mg/dL — ABNORMAL HIGH (ref 70–99)
POTASSIUM: 4.1 meq/L (ref 3.5–5.1)
Sodium: 139 mEq/L (ref 135–145)

## 2017-09-11 LAB — TSH: TSH: 1.6 u[IU]/mL (ref 0.35–4.50)

## 2017-09-14 ENCOUNTER — Encounter: Payer: Self-pay | Admitting: Internal Medicine

## 2017-09-14 ENCOUNTER — Ambulatory Visit: Payer: BLUE CROSS/BLUE SHIELD | Admitting: Internal Medicine

## 2017-09-14 DIAGNOSIS — M545 Low back pain: Secondary | ICD-10-CM

## 2017-09-14 DIAGNOSIS — C8228 Follicular lymphoma grade III, unspecified, lymph nodes of multiple sites: Secondary | ICD-10-CM | POA: Diagnosis not present

## 2017-09-14 DIAGNOSIS — R600 Localized edema: Secondary | ICD-10-CM

## 2017-09-14 DIAGNOSIS — M79604 Pain in right leg: Secondary | ICD-10-CM

## 2017-09-14 DIAGNOSIS — F411 Generalized anxiety disorder: Secondary | ICD-10-CM

## 2017-09-14 DIAGNOSIS — M79605 Pain in left leg: Secondary | ICD-10-CM

## 2017-09-14 MED ORDER — OXYCODONE-ACETAMINOPHEN 5-325 MG PO TABS: 1.0000 | ORAL_TABLET | Freq: Four times a day (QID) | ORAL | 0 refills | Status: DC | PRN

## 2017-09-14 NOTE — Progress Notes (Signed)
Subjective:  Patient ID: [name], male    DOB: Dec 10, 1955  Age: 62 y.o. MRN: [id number]  CC: No chief complaint on file.   HPI [name] presents for bronchitis/pneumonia   Outpatient Medications Prior to Visit  Medication Sig Dispense Refill  . amoxicillin-clavulanate (AUGMENTIN) 875-125 MG tablet Take 1 tablet by mouth 2 (two) times daily. 20 tablet 0  . ANORO ELLIPTA 62.5-25 MCG/INH AEPB Inhale 1 puff into the lungs daily as needed. For wheezing & SOB 180 each 3  . aspirin EC 81 MG tablet Take 81 mg by mouth daily.    . clonazePAM (KLONOPIN) 0.5 MG tablet TAKE 1 TABLET TWICE A DAY AS NEEDED FOR ANXIETY OR AT BEDTIME FOR INSOMNIA 180 tablet 1  . clopidogrel (PLAVIX) 75 MG tablet Take 1 tablet (75 mg total) by mouth daily with breakfast. 90 tablet 3  . metoprolol succinate (TOPROL-XL) 50 MG 24 hr tablet TAKE 2 TABLETS (100MG )     DAILY WITH OR IMMEDIATELY  FOLLOWING A MEAL 180 tablet 3  . nitroGLYCERIN (NITROSTAT) 0.4 MG SL tablet Place 1 tablet (0.4 mg total) under the tongue every 5 (five) minutes as needed for chest pain (Up to 3 doses). 25 tablet 1  . oxyCODONE-acetaminophen (PERCOCET/ROXICET) 5-325 MG tablet Take 1 tablet by mouth every 6 (six) hours as needed for severe pain. 120 tablet 0  . potassium chloride SA (KLOR-CON M20) 20 MEQ tablet Take 1 tablet (20 mEq total) by mouth daily. 90 tablet 0  . simvastatin (ZOCOR) 20 MG tablet Take 1 tablet (20 mg total) by mouth at bedtime. 90 tablet 3  . torsemide (DEMADEX) 100 MG tablet TAKE 1 TABLET DAILY 90 tablet 1   Facility-Administered Medications Prior to Visit  Medication Dose Route Frequency Provider Last Rate Last Dose  . ipratropium (ATROVENT) nebulizer solution 0.5 mg  0.5 mg Nebulization Once Laurey Morale, MD        ROS Review of Systems  Constitutional: Positive for fatigue. Negative for appetite change and unexpected weight change.  HENT: Negative for congestion, nosebleeds, sneezing, sore throat and  trouble swallowing.   Eyes: Negative for itching and visual disturbance.  Respiratory: Positive for shortness of breath. Negative for cough.   Cardiovascular: Positive for leg swelling. Negative for chest pain and palpitations.  Gastrointestinal: Negative for abdominal distention, blood in stool, diarrhea and nausea.  Genitourinary: Negative for frequency and hematuria.  Musculoskeletal: Positive for arthralgias, back pain and gait problem. Negative for neck pain.  Skin: Positive for color change and wound. Negative for rash.  Neurological: Negative for dizziness, tremors, speech difficulty and weakness.  Psychiatric/Behavioral: Negative for agitation, dysphoric mood and sleep disturbance. The patient is not nervous/anxious.     Objective:  BP 126/64 (BP Location: Left Arm, Patient Position: Sitting, Cuff Size: Large)   Pulse 69   Temp 98.7 F (37.1 C) (Oral)   Ht 6' (1.829 m)   Wt (!) 329 lb (149.2 kg)   SpO2 95%   BMI 44.62 kg/m   BP Readings from Last 3 Encounters:  09/14/17 126/64  09/10/17 130/70  07/16/17 126/68    Wt Readings from Last 3 Encounters:  09/14/17 (!) 329 lb (149.2 kg)  09/10/17 (!) 327 lb (148.3 kg)  07/16/17 (!) 325 lb (147.4 kg)    Physical Exam  Constitutional: He is oriented to person, place, and time. He appears well-developed. No distress.  NAD  HENT:  Mouth/Throat: Oropharynx is clear and moist.  Eyes: Conjunctivae are  normal. Pupils are equal, round, and reactive to light.  Neck: Normal range of motion. No JVD present. No thyromegaly present.  Cardiovascular: Normal rate, regular rhythm, normal heart sounds and intact distal pulses. Exam reveals no gallop and no friction rub.  No murmur heard. Pulmonary/Chest: Effort normal and breath sounds normal. No respiratory distress. He has no wheezes. He has no rales. He exhibits no tenderness.  Abdominal: Soft. Bowel sounds are normal. He exhibits no distension and no mass. There is no tenderness.  There is no rebound and no guarding.  Musculoskeletal: Normal range of motion. He exhibits edema and tenderness.  Lymphadenopathy:    He has no cervical adenopathy.  Neurological: He is alert and oriented to person, place, and time. He has normal reflexes. No cranial nerve deficit. He exhibits normal muscle tone. He displays a negative Romberg sign. Coordination and gait normal.  Skin: Skin is warm and dry. No rash noted.  Psychiatric: He has a normal mood and affect. His behavior is normal. Judgment and thought content normal.  Lower extremities with 1+ chronic edema  chronic ankle hyperpigmentation obese Lower back is painful with range of motion  Lab Results  Component Value Date   WBC 8.9 05/18/2017   HGB 16.1 05/18/2017   HCT 49.0 05/18/2017   PLT 135 (L) 05/18/2017   GLUCOSE 148 (H) 09/11/2017   CHOL 131 03/25/2014   TRIG 185.0 (H) 03/25/2014   HDL 24.00 (L) 03/25/2014   LDLCALC 70 03/25/2014   ALT 14 09/11/2017   AST 18 09/11/2017   NA 139 09/11/2017   K 4.1 09/11/2017   CL 93 (L) 09/11/2017   CREATININE 0.89 09/11/2017   BUN 14 09/11/2017   CO2 35 (H) 09/11/2017   TSH 1.60 09/11/2017   PSA 0.23 09/15/2011   INR 0.91 05/18/2017   HGBA1C 6.0 09/11/2017   MICROALBUR 0.7 03/29/2016    Dg Chest 2 View  Result Date: 09/11/2017 CLINICAL DATA:  Cough and congestion. EXAM: CHEST  2 VIEW COMPARISON:  CT 04/19/2017. PET-CT 11/03/2016. Chest x-ray 01/18/2015. FINDINGS: Prior CABG. Cardiomegaly with mild pulmonary vascular prominence and bilateral interstitial prominence consistent with CHF. Small bilateral pleural effusions. 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 IMPRESSION: One prior CABG. Stable cardiomegaly. Diffuse bilateral from interstitial prominence and bilateral pleural effusions again noted. Component these changes may be chronic however 6 mild CHF cannot be excluded. 2. 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 Electronically Signed   By: Marcello Moores  Register   On: 09/11/2017 12:44    Assessment & Plan:   There are no diagnoses linked to this encounter. I am having [name] maintain his aspirin EC, nitroGLYCERIN, clopidogrel, simvastatin, ANORO ELLIPTA, potassium chloride SA, metoprolol succinate, torsemide, oxyCODONE-acetaminophen, clonazePAM, and amoxicillin-clavulanate. We will continue to administer ipratropium.  No orders of the defined types were placed in this encounter.    Follow-up: No Follow-up on file.  Walker Kehr, MD

## 2017-09-15 ENCOUNTER — Other Ambulatory Visit: Payer: Self-pay | Admitting: Internal Medicine

## 2017-09-16 NOTE — Assessment & Plan Note (Addendum)
Percocet as needed   Potential benefits of a long term opioids use as well as potential risks (i.e. addiction risk, apnea etc) and complications (i.e. Somnolence, constipation and others) were explained to the patient and were aknowledged.

## 2017-09-16 NOTE — Assessment & Plan Note (Signed)
Chronic.  Reasonably well controlled at present.  On torsemide.

## 2017-09-16 NOTE — Assessment & Plan Note (Signed)
The patient has finished chemo; follow-up with oncology

## 2017-09-16 NOTE — Assessment & Plan Note (Signed)
Clonazepam as needed

## 2017-09-16 NOTE — Assessment & Plan Note (Signed)
Wt Readings from Last 3 Encounters:  09/14/17 (!) 329 lb (149.2 kg)  09/10/17 (!) 327 lb (148.3 kg)  07/16/17 (!) 325 lb (147.4 kg)

## 2017-10-15 ENCOUNTER — Ambulatory Visit: Payer: BLUE CROSS/BLUE SHIELD | Admitting: Internal Medicine

## 2017-10-18 ENCOUNTER — Other Ambulatory Visit: Payer: Self-pay | Admitting: Hematology and Oncology

## 2017-10-18 DIAGNOSIS — C8228 Follicular lymphoma grade III, unspecified, lymph nodes of multiple sites: Secondary | ICD-10-CM

## 2017-10-19 ENCOUNTER — Encounter: Payer: Self-pay | Admitting: Hematology and Oncology

## 2017-10-19 ENCOUNTER — Other Ambulatory Visit: Payer: BLUE CROSS/BLUE SHIELD

## 2017-10-19 ENCOUNTER — Ambulatory Visit: Payer: BLUE CROSS/BLUE SHIELD | Admitting: Hematology and Oncology

## 2017-10-25 ENCOUNTER — Other Ambulatory Visit: Payer: Self-pay | Admitting: Internal Medicine

## 2017-10-26 ENCOUNTER — Other Ambulatory Visit: Payer: Self-pay | Admitting: Internal Medicine

## 2017-10-29 NOTE — Progress Notes (Signed)
err

## 2017-11-01 DIAGNOSIS — L720 Epidermal cyst: Secondary | ICD-10-CM | POA: Diagnosis not present

## 2017-11-15 ENCOUNTER — Encounter (HOSPITAL_COMMUNITY): Payer: Self-pay | Admitting: *Deleted

## 2017-11-15 ENCOUNTER — Emergency Department (HOSPITAL_COMMUNITY): Payer: BLUE CROSS/BLUE SHIELD

## 2017-11-15 ENCOUNTER — Inpatient Hospital Stay (HOSPITAL_COMMUNITY)
Admission: EM | Admit: 2017-11-15 | Discharge: 2017-11-17 | DRG: 190 | Disposition: A | Discharge status: Home / Self Care | Payer: BLUE CROSS/BLUE SHIELD | Attending: Internal Medicine | Admitting: Internal Medicine

## 2017-11-15 ENCOUNTER — Telehealth: Payer: Self-pay

## 2017-11-15 ENCOUNTER — Ambulatory Visit (INDEPENDENT_AMBULATORY_CARE_PROVIDER_SITE_OTHER): Payer: BLUE CROSS/BLUE SHIELD | Admitting: Family

## 2017-11-15 VITALS — BP 134/60 | HR 94 | Temp 98.4°F | Ht 72.0 in | Wt 322.0 lb

## 2017-11-15 DIAGNOSIS — Z9981 Dependence on supplemental oxygen: Secondary | ICD-10-CM | POA: Diagnosis not present

## 2017-11-15 DIAGNOSIS — R0902 Hypoxemia: Secondary | ICD-10-CM

## 2017-11-15 DIAGNOSIS — Z955 Presence of coronary angioplasty implant and graft: Secondary | ICD-10-CM | POA: Diagnosis not present

## 2017-11-15 DIAGNOSIS — Z91048 Other nonmedicinal substance allergy status: Secondary | ICD-10-CM | POA: Diagnosis not present

## 2017-11-15 DIAGNOSIS — Z85828 Personal history of other malignant neoplasm of skin: Secondary | ICD-10-CM | POA: Diagnosis not present

## 2017-11-15 DIAGNOSIS — J441 Chronic obstructive pulmonary disease with (acute) exacerbation: Secondary | ICD-10-CM | POA: Diagnosis not present

## 2017-11-15 DIAGNOSIS — Z79899 Other long term (current) drug therapy: Secondary | ICD-10-CM

## 2017-11-15 DIAGNOSIS — Z7982 Long term (current) use of aspirin: Secondary | ICD-10-CM | POA: Diagnosis not present

## 2017-11-15 DIAGNOSIS — E1151 Type 2 diabetes mellitus with diabetic peripheral angiopathy without gangrene: Secondary | ICD-10-CM | POA: Diagnosis not present

## 2017-11-15 DIAGNOSIS — I252 Old myocardial infarction: Secondary | ICD-10-CM

## 2017-11-15 DIAGNOSIS — E119 Type 2 diabetes mellitus without complications: Secondary | ICD-10-CM | POA: Diagnosis present

## 2017-11-15 DIAGNOSIS — R509 Fever, unspecified: Secondary | ICD-10-CM | POA: Diagnosis not present

## 2017-11-15 DIAGNOSIS — I10 Essential (primary) hypertension: Secondary | ICD-10-CM | POA: Diagnosis present

## 2017-11-15 DIAGNOSIS — Z7902 Long term (current) use of antithrombotics/antiplatelets: Secondary | ICD-10-CM | POA: Diagnosis not present

## 2017-11-15 DIAGNOSIS — G4730 Sleep apnea, unspecified: Secondary | ICD-10-CM | POA: Diagnosis present

## 2017-11-15 DIAGNOSIS — Z8572 Personal history of non-Hodgkin lymphomas: Secondary | ICD-10-CM | POA: Diagnosis not present

## 2017-11-15 DIAGNOSIS — I5032 Chronic diastolic (congestive) heart failure: Secondary | ICD-10-CM | POA: Diagnosis not present

## 2017-11-15 DIAGNOSIS — F17210 Nicotine dependence, cigarettes, uncomplicated: Secondary | ICD-10-CM | POA: Diagnosis present

## 2017-11-15 DIAGNOSIS — Z6841 Body Mass Index (BMI) 40.0 and over, adult: Secondary | ICD-10-CM | POA: Diagnosis not present

## 2017-11-15 DIAGNOSIS — F419 Anxiety disorder, unspecified: Secondary | ICD-10-CM | POA: Diagnosis not present

## 2017-11-15 DIAGNOSIS — J9601 Acute respiratory failure with hypoxia: Secondary | ICD-10-CM | POA: Diagnosis present

## 2017-11-15 DIAGNOSIS — Z951 Presence of aortocoronary bypass graft: Secondary | ICD-10-CM

## 2017-11-15 DIAGNOSIS — E785 Hyperlipidemia, unspecified: Secondary | ICD-10-CM | POA: Diagnosis present

## 2017-11-15 DIAGNOSIS — I2510 Atherosclerotic heart disease of native coronary artery without angina pectoris: Secondary | ICD-10-CM | POA: Diagnosis not present

## 2017-11-15 DIAGNOSIS — Z888 Allergy status to other drugs, medicaments and biological substances status: Secondary | ICD-10-CM | POA: Diagnosis not present

## 2017-11-15 DIAGNOSIS — I739 Peripheral vascular disease, unspecified: Principal | ICD-10-CM

## 2017-11-15 DIAGNOSIS — I779 Disorder of arteries and arterioles, unspecified: Secondary | ICD-10-CM

## 2017-11-15 DIAGNOSIS — I110 Hypertensive heart disease with heart failure: Secondary | ICD-10-CM | POA: Diagnosis not present

## 2017-11-15 DIAGNOSIS — R05 Cough: Secondary | ICD-10-CM | POA: Diagnosis not present

## 2017-11-15 DIAGNOSIS — R0602 Shortness of breath: Secondary | ICD-10-CM | POA: Diagnosis not present

## 2017-11-15 LAB — CBC WITH DIFFERENTIAL/PLATELET
BASOS ABS: 0 10*3/uL (ref 0.0–0.1)
BASOS PCT: 0 %
EOS PCT: 1 %
Eosinophils Absolute: 0.1 10*3/uL (ref 0.0–0.7)
HCT: 52.1 % — ABNORMAL HIGH (ref 39.0–52.0)
Hemoglobin: 17.4 g/dL — ABNORMAL HIGH (ref 13.0–17.0)
Lymphocytes Relative: 10 %
Lymphs Abs: 1 10*3/uL (ref 0.7–4.0)
MCH: 33.7 pg (ref 26.0–34.0)
MCHC: 33.4 g/dL (ref 30.0–36.0)
MCV: 101 fL — AB (ref 78.0–100.0)
MONO ABS: 1.5 10*3/uL — AB (ref 0.1–1.0)
Monocytes Relative: 15 %
Neutro Abs: 7.7 10*3/uL (ref 1.7–7.7)
Neutrophils Relative %: 74 %
PLATELETS: 139 10*3/uL — AB (ref 150–400)
RBC: 5.16 MIL/uL (ref 4.22–5.81)
RDW: 13.2 % (ref 11.5–15.5)
WBC: 10.5 10*3/uL (ref 4.0–10.5)

## 2017-11-15 LAB — I-STAT TROPONIN, ED: Troponin i, poc: 0.02 ng/mL (ref 0.00–0.08)

## 2017-11-15 LAB — I-STAT CHEM 8, ED
BUN: 13 mg/dL (ref 6–20)
Calcium, Ion: 1.08 mmol/L — ABNORMAL LOW (ref 1.15–1.40)
Chloride: 91 mmol/L — ABNORMAL LOW (ref 101–111)
Creatinine, Ser: 1 mg/dL (ref 0.61–1.24)
Glucose, Bld: 115 mg/dL — ABNORMAL HIGH (ref 65–99)
HEMATOCRIT: 54 % — AB (ref 39.0–52.0)
HEMOGLOBIN: 18.4 g/dL — AB (ref 13.0–17.0)
Potassium: 3.8 mmol/L (ref 3.5–5.1)
SODIUM: 138 mmol/L (ref 135–145)
TCO2: 37 mmol/L — AB (ref 22–32)

## 2017-11-15 LAB — BASIC METABOLIC PANEL
ANION GAP: 12 (ref 5–15)
BUN: 13 mg/dL (ref 6–20)
CALCIUM: 8.9 mg/dL (ref 8.9–10.3)
CO2: 34 mmol/L — ABNORMAL HIGH (ref 22–32)
CREATININE: 0.85 mg/dL (ref 0.61–1.24)
Chloride: 92 mmol/L — ABNORMAL LOW (ref 101–111)
GLUCOSE: 110 mg/dL — AB (ref 65–99)
Potassium: 3.9 mmol/L (ref 3.5–5.1)
Sodium: 138 mmol/L (ref 135–145)

## 2017-11-15 LAB — GLUCOSE, CAPILLARY: GLUCOSE-CAPILLARY: 184 mg/dL — AB (ref 65–99)

## 2017-11-15 LAB — BRAIN NATRIURETIC PEPTIDE: B NATRIURETIC PEPTIDE 5: 150.6 pg/mL — AB (ref 0.0–100.0)

## 2017-11-15 MED ORDER — PREDNISONE 20 MG PO TABS
40.0000 mg | ORAL_TABLET | Freq: Every day | ORAL | Status: DC
  Administered 2017-11-16: 40 mg via ORAL
  Filled 2017-11-15: qty 2

## 2017-11-15 MED ORDER — ONDANSETRON HCL 4 MG/2ML IJ SOLN: 4.0000 mg | Freq: Four times a day (QID) | INTRAMUSCULAR | Status: DC | PRN

## 2017-11-15 MED ORDER — SIMVASTATIN 20 MG PO TABS
20.0000 mg | ORAL_TABLET | Freq: Every day | ORAL | Status: DC
  Administered 2017-11-15 – 2017-11-16 (×2): 20 mg via ORAL
  Filled 2017-11-15: qty 1
  Filled 2017-11-15 (×2): qty 2
  Filled 2017-11-15: qty 1

## 2017-11-15 MED ORDER — ACETAMINOPHEN 325 MG PO TABS: 650.0000 mg | ORAL_TABLET | Freq: Four times a day (QID) | ORAL | Status: DC | PRN

## 2017-11-15 MED ORDER — IPRATROPIUM-ALBUTEROL 0.5-2.5 (3) MG/3ML IN SOLN
3.0000 mL | RESPIRATORY_TRACT | Status: AC
  Administered 2017-11-15 (×3): 3 mL via RESPIRATORY_TRACT
  Filled 2017-11-15: qty 9

## 2017-11-15 MED ORDER — IPRATROPIUM-ALBUTEROL 0.5-2.5 (3) MG/3ML IN SOLN
3.0000 mL | Freq: Three times a day (TID) | RESPIRATORY_TRACT | Status: DC
Start: 2017-11-15 — End: 2017-11-17
  Administered 2017-11-15 – 2017-11-17 (×5): 3 mL via RESPIRATORY_TRACT
  Filled 2017-11-15 (×4): qty 3

## 2017-11-15 MED ORDER — CLONAZEPAM 0.5 MG PO TABS
0.5000 mg | ORAL_TABLET | Freq: Two times a day (BID) | ORAL | Status: DC | PRN
  Administered 2017-11-15 – 2017-11-16 (×2): 0.5 mg via ORAL
  Filled 2017-11-15 (×2): qty 1

## 2017-11-15 MED ORDER — ASPIRIN EC 81 MG PO TBEC
81.0000 mg | DELAYED_RELEASE_TABLET | Freq: Every day | ORAL | Status: DC
  Administered 2017-11-16 – 2017-11-17 (×2): 81 mg via ORAL
  Filled 2017-11-15 (×2): qty 1

## 2017-11-15 MED ORDER — ACETAMINOPHEN 650 MG RE SUPP: 650.0000 mg | Freq: Four times a day (QID) | RECTAL | Status: DC | PRN

## 2017-11-15 MED ORDER — INSULIN ASPART 100 UNIT/ML ~~LOC~~ SOLN
0.0000 [IU] | Freq: Three times a day (TID) | SUBCUTANEOUS | Status: DC
  Administered 2017-11-16: 5 [IU] via SUBCUTANEOUS
  Administered 2017-11-16: 3 [IU] via SUBCUTANEOUS
  Administered 2017-11-16 – 2017-11-17 (×2): 2 [IU] via SUBCUTANEOUS

## 2017-11-15 MED ORDER — LEVOFLOXACIN 750 MG PO TABS
750.0000 mg | ORAL_TABLET | ORAL | Status: DC
  Administered 2017-11-16: 750 mg via ORAL
  Filled 2017-11-15: qty 1

## 2017-11-15 MED ORDER — LEVOFLOXACIN IN D5W 750 MG/150ML IV SOLN
750.0000 mg | Freq: Once | INTRAVENOUS | Status: AC
  Administered 2017-11-15: 750 mg via INTRAVENOUS
  Filled 2017-11-15: qty 150

## 2017-11-15 MED ORDER — ONDANSETRON HCL 4 MG PO TABS: 4.0000 mg | ORAL_TABLET | Freq: Four times a day (QID) | ORAL | Status: DC | PRN

## 2017-11-15 MED ORDER — PREDNISONE 20 MG PO TABS
60.0000 mg | ORAL_TABLET | Freq: Once | ORAL | Status: AC
  Administered 2017-11-15: 60 mg via ORAL
  Filled 2017-11-15: qty 3

## 2017-11-15 MED ORDER — IPRATROPIUM-ALBUTEROL 0.5-2.5 (3) MG/3ML IN SOLN
RESPIRATORY_TRACT | Status: AC
  Filled 2017-11-15: qty 3

## 2017-11-15 MED ORDER — OXYCODONE-ACETAMINOPHEN 5-325 MG PO TABS
1.0000 | ORAL_TABLET | Freq: Four times a day (QID) | ORAL | Status: DC | PRN
  Administered 2017-11-15 – 2017-11-17 (×3): 1 via ORAL
  Filled 2017-11-15 (×3): qty 1

## 2017-11-15 MED ORDER — CLOPIDOGREL BISULFATE 75 MG PO TABS
75.0000 mg | ORAL_TABLET | Freq: Every day | ORAL | Status: DC
  Administered 2017-11-16 – 2017-11-17 (×2): 75 mg via ORAL
  Filled 2017-11-15 (×2): qty 1

## 2017-11-15 MED ORDER — ALBUTEROL SULFATE (2.5 MG/3ML) 0.083% IN NEBU: 2.5000 mg | INHALATION_SOLUTION | RESPIRATORY_TRACT | Status: DC | PRN

## 2017-11-15 MED ORDER — MOMETASONE FURO-FORMOTEROL FUM 200-5 MCG/ACT IN AERO
2.0000 | INHALATION_SPRAY | Freq: Two times a day (BID) | RESPIRATORY_TRACT | Status: DC
  Administered 2017-11-16: 2 via RESPIRATORY_TRACT
  Filled 2017-11-15: qty 8.8

## 2017-11-15 MED ORDER — POTASSIUM CHLORIDE CRYS ER 20 MEQ PO TBCR
20.0000 meq | EXTENDED_RELEASE_TABLET | Freq: Every day | ORAL | Status: DC
  Administered 2017-11-16 – 2017-11-17 (×2): 20 meq via ORAL
  Filled 2017-11-15: qty 1
  Filled 2017-11-15: qty 2

## 2017-11-15 MED ORDER — MAGNESIUM SULFATE 2 GM/50ML IV SOLN
2.0000 g | Freq: Once | INTRAVENOUS | Status: AC
  Administered 2017-11-15: 2 g via INTRAVENOUS
  Filled 2017-11-15: qty 50

## 2017-11-15 MED ORDER — TORSEMIDE 100 MG PO TABS
100.0000 mg | ORAL_TABLET | Freq: Every day | ORAL | Status: DC
  Administered 2017-11-16 – 2017-11-17 (×2): 100 mg via ORAL
  Filled 2017-11-15 (×2): qty 1

## 2017-11-15 MED ORDER — IPRATROPIUM BROMIDE 0.02 % IN SOLN: 0.5000 mg | Freq: Four times a day (QID) | RESPIRATORY_TRACT | Status: DC

## 2017-11-15 MED ORDER — METOPROLOL SUCCINATE ER 100 MG PO TB24
100.0000 mg | ORAL_TABLET | Freq: Every day | ORAL | Status: DC
  Administered 2017-11-15 – 2017-11-16 (×2): 100 mg via ORAL
  Filled 2017-11-15 (×2): qty 1

## 2017-11-15 NOTE — ED Notes (Signed)
Bed: HY38 Expected date:  Expected time:  Means of arrival:  Comments: Hold for triage 1

## 2017-11-15 NOTE — ED Provider Notes (Signed)
[facility] DEPT Provider Note   CSN: [id number] Arrival date & time: 11/15/17  1619     History   Chief Complaint Chief Complaint  Patient presents with  . Shortness of Breath    HPI [name] is a 62 y.o. male.  62 yo M with a cc of shortness of breath.  This been going on for the past week started with cough and congestion.  Has been having change in sputum and increased sputum.  Went to see his pulmonologist today who found his oxygen saturation was in the 70s and sent him to the emergency department for evaluation.  The patient denies any increased fluid on his body.  He does not think this is heart failure.  Thinks it is related to his bronchitis.  He was seen a couple months ago for the same.  Was started on Augmentin and had improvement.  Denies overt fevers but states he has felt warm.  Has had some sick contacts at work.  The history is provided by the patient and the spouse.  Illness  This is a new problem. The current episode started 2 days ago. The problem occurs constantly. The problem has not changed since onset.Pertinent negatives include no chest pain, no abdominal pain, no headaches and no shortness of breath. Nothing aggravates the symptoms. Nothing relieves the symptoms. He has tried nothing for the symptoms. The treatment provided no relief.    Past Medical History:  Diagnosis Date  . Anginal pain ([place])    not had to use in awhile  none in 10 years  . Anxiety   . Basal cell carcinoma of nose   . Blood dyscrasia    trouble clotting   . Carotid artery disease ([place])    s/p L CEA 2009 (hx of evacuation of hematoma due to heparin)  . CHF (congestive heart failure) ([place])   . Chronic lower back pain   . COPD (chronic obstructive pulmonary disease) ([place])   . Coronary artery disease    CABG 2005. s/p PTCA and stenting of the saphenous vein graft to PDA and saphenous vein graft to obtuse marginal by Dr. Burt Knack 09/29/11. Normal EF at  cath 09/2011  . Depression   . Diabetes mellitus without complication (HCC)    borderline   . Dysrhythmia    fluttering  . Edema   . GERD (gastroesophageal reflux disease)   . Heart murmur   . 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 History of home oxygen therapy    2 liters at night prn  . Hyperlipidemia   . Hypertension   . Lymphoma ([place]) 12/21/2014  . Myocardial infarction ([place])   . Obesity   . Shortness of breath dyspnea    with exertion   . Sleep apnea    "used to"      could not use 2006    Patient Active Problem List   Diagnosis Date Noted  . Port catheter in place 01/12/2017  . Skin lesion of back 04/28/2016  . Low back pain radiating to both legs 09/28/2015  . Boil of upper extremity 08/20/2015  . Bilateral leg ulcer ([place]) 06/25/2015  . URI (upper respiratory infection) 04/26/2015  . Rash 04/26/2015  . Basal cell carcinoma of back 04/08/2015  . Well adult exam 04/01/2015  . Cellulitis of hand, left 02/11/2015  . Vasculitis ([place])   . Cellulitis of right lower extremity   . Erythroderma   . Purpura ([place])   . RMSF (  [facility] spotted fever)   . Chronic diastolic CHF (congestive heart failure) ([place])   . Thrombocytopenia ([place])   . Coronary artery disease involving coronary bypass graft of native heart without angina pectoris   . Bilateral lower extremity edema 01/12/2015  . Hypersensitivity reaction 01/02/2015  . Asymptomatic hyperuricemia 12/30/2014  . 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 Mass of neck 12/11/2014    Class: Chronic  . Morbid obesity ([place]) 12/11/2014  . Diabetes mellitus type 2, controlled ([place]) 12/11/2014  . Depression 12/11/2014  . Neoplasm of uncertain behavior of skin 09/25/2014  . Neck mass 07/28/2014  . Insomnia 03/25/2014  . Erectile dysfunction 09/22/2013  . Chronic venous insufficiency 02/25/2013  . 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 Anxiety state 02/25/2013  . Murmur  09/03/2012  . Tobacco abuse 12/02/2010  . COPD mixed type ([place]) 06/29/2009  . G E R D 06/29/2009  . ENLARGEMENT OF LYMPH NODES 06/29/2009  . HEPARIN-INDUCED THROMBOCYTOPENIA 02/11/2009  . Edema 02/11/2009  . Carotid disease, bilateral ([place]) 02/11/2009  . Elevated lipids 05/23/2007  . Essential hypertension 05/23/2007  . Sleep apnea 05/23/2007    Past Surgical History:  Procedure Laterality Date  . BASAL CELL CARCINOMA EXCISION  2000's   nose  . CAROTID ENDARTERECTOMY  03/2008   left  . CORONARY ANGIOPLASTY WITH STENT PLACEMENT  09/29/11   "2"  . CORONARY ARTERY BYPASS GRAFT  2005   CABG X3  . evacuation of hematoma  03/2008   left neck; S/P endarterectomy; "cause heparin clotted it up"  . I&D EXTREMITY Left 02/11/2015   Procedure: IRRIGATION AND DEBRIDEMENT LEFT LONG FINGER;  Surgeon: Leanora Cover, MD;  Location: [facility];  Service: Orthopedics;  Laterality: Left;  I and D Left Long Finger  . IR REMOVAL TUN ACCESS W/ PORT W/O FL MOD SED  05/18/2017  . MASS EXCISION Left 12/11/2014   Procedure: EXCISIONAL BIOPSY OF LEFT SUPRA CLAVICULAR NECK MASS;  Surgeon: Jerrell Belfast, MD;  Location: [facility];  Service: ENT;  Laterality: Left;  . PERCUTANEOUS CORONARY STENT INTERVENTION (PCI-S) N/A 09/29/2011   Procedure: PERCUTANEOUS CORONARY STENT INTERVENTION (PCI-S);  Surgeon: Sherren Mocha, MD;  Location: [facility] CATH LAB;  Service: Cardiovascular;  Laterality: N/A;  . PORTACATH PLACEMENT  2016  . SUPERFICIAL LYMPH NODE BIOPSY / EXCISION Left         Home Medications    Prior to Admission medications   Medication Sig Start Date End Date Taking? Authorizing Provider  albuterol (PROVENTIL) (2.5 MG/3ML) 0.083% nebulizer solution Take 2.5 mg by nebulization daily as needed for wheezing or shortness of breath.   Yes [provider]  ANORO ELLIPTA 62.5-25 MCG/INH AEPB Inhale 1 puff into the lungs daily as needed. For wheezing & SOB 08/24/16  Yes Plotnikov, [name], MD    aspirin EC 81 MG tablet Take 81 mg by mouth daily.   Yes [provider]  clonazePAM (KLONOPIN) 0.5 MG tablet TAKE 1 TABLET TWICE A DAY AS NEEDED FOR ANXIETY OR AT BEDTIME FOR INSOMNIA 08/15/17  Yes Plotnikov, [name], MD  clopidogrel (PLAVIX) 75 MG tablet TAKE 1 TABLET DAILY WITH   BREAKFAST 09/16/17  Yes Plotnikov, [name], MD  KLOR-CON M20 20 MEQ tablet TAKE 1 TABLET DAILY 10/26/17  Yes Plotnikov, [name], MD  metoprolol succinate (TOPROL-XL) 50 MG 24 hr tablet TAKE 2 TABLETS (100MG )     DAILY WITH OR IMMEDIATELY  FOLLOWING A MEAL 04/27/17  Yes Plotnikov,  [name], MD  oxyCODONE-acetaminophen (PERCOCET/ROXICET) 5-325 MG tablet Take 1 tablet by mouth every 6 (six) hours as needed for severe pain. 09/14/17  Yes Plotnikov, [name], MD  simvastatin (ZOCOR) 20 MG tablet TAKE 1 TABLET AT BEDTIME 10/25/17  Yes Plotnikov, [name], MD  torsemide (DEMADEX) 100 MG tablet TAKE 1 TABLET DAILY 05/22/17  Yes Plotnikov, [name], MD  nitroGLYCERIN (NITROSTAT) 0.4 MG SL tablet Place 1 tablet (0.4 mg total) under the tongue every 5 (five) minutes as needed for chest pain (Up to 3 doses). 01/21/16 10/09/17  [name], MD    Family History Family History  Problem Relation Age of Onset  . Heart disease Father   . Bone cancer Mother     Social History Social History   Tobacco Use  . Smoking status: Current Some Day Smoker    Packs/day: 2.00    Years: 41.00    Pack years: 82.00    Types: Cigarettes  . Smokeless tobacco: Former Systems developer    Types: Chew  . Tobacco comment: consult entered  Substance Use Topics  . Alcohol use: No    Alcohol/week: 0.0 oz    Comment: 09/29/11 "quit years ago"  . Drug use: Yes    Types: Marijuana    Comment: "last time was in the 1990's"     Allergies   Bendamustine hcl; Heparin; and Tape   Review of Systems Review of Systems  Constitutional: Positive for fever (low grade). Negative for chills.  HENT: Positive for congestion. Negative for facial  swelling.   Eyes: Negative for discharge and visual disturbance.  Respiratory: Positive for cough. Negative for shortness of breath.   Cardiovascular: Negative for chest pain and palpitations.  Gastrointestinal: Negative for abdominal pain, diarrhea and vomiting.  Musculoskeletal: Negative for arthralgias and myalgias.  Skin: Negative for color change and rash.  Neurological: Negative for tremors, syncope and headaches.  Psychiatric/Behavioral: Negative for confusion and dysphoric mood.     Physical Exam Updated Vital Signs BP (!) 156/76 (BP Location: Left Arm)   Pulse 75   Temp 98.2 F (36.8 C)   Resp 18   SpO2 92%   Physical Exam  Constitutional: He is oriented to person, place, and time. He appears well-developed and well-nourished.  HENT:  Head: Normocephalic and atraumatic.  Eyes: Pupils are equal, round, and reactive to light. EOM are normal.  Neck: Normal range of motion. Neck supple. No JVD present.  Cardiovascular: Normal rate and regular rhythm. Exam reveals no gallop and no friction rub.  No murmur heard. Pulmonary/Chest: No respiratory distress. He has wheezes.  Diffuse wheezing and prolonged expiration.  Tachypnea  Abdominal: He exhibits no distension and no mass. There is no tenderness. There is no rebound and no guarding.  Musculoskeletal: Normal range of motion. He exhibits edema (2+ bilaterally).  Neurological: He is alert and oriented to person, place, and time.  Skin: No rash noted. No pallor.  Psychiatric: He has a normal mood and affect. His behavior is normal.  Nursing note and vitals reviewed.    ED Treatments / Results  Labs (all labs ordered are listed, but only abnormal results are displayed) Labs Reviewed  BRAIN NATRIURETIC PEPTIDE - Abnormal; Notable for the following components:      Result Value   B Natriuretic Peptide 150.6 (*)    All other components within normal limits  CBC WITH DIFFERENTIAL/PLATELET - Abnormal; Notable for the  following components:   Hemoglobin 17.4 (*)    HCT 52.1 (*)  MCV 101.0 (*)    Platelets 139 (*)    Monocytes Absolute 1.5 (*)    All other components within normal limits  BASIC METABOLIC PANEL - Abnormal; Notable for the following components:   Chloride 92 (*)    CO2 34 (*)    Glucose, Bld 110 (*)    All other components within normal limits  I-STAT CHEM 8, ED - Abnormal; Notable for the following components:   Chloride 91 (*)    Glucose, Bld 115 (*)    Calcium, Ion 1.08 (*)    TCO2 37 (*)    Hemoglobin 18.4 (*)    HCT 54.0 (*)    All other components within normal limits  I-STAT TROPONIN, ED    EKG EKG Interpretation  Date/Time:  Thursday November 15 2017 16:37:37 EDT Ventricular Rate:  73 PR Interval:    QRS Duration: 167 QT Interval:  457 QTC Calculation: 504 R Axis:   105 Text Interpretation:  Sinus rhythm RBBB and LPFB Borderline ST depression, lateral leads diffuse ST depressions compared to previous, needs repeat due to artifact Confirmed by Theotis Burrow [phone]) on 11/15/2017 4:45:19 PM   Radiology Dg Chest 2 View  Result Date: 11/15/2017 CLINICAL DATA:  Fever, cough, congestion, and shortness of breath for the past week. EXAM: CHEST - 2 VIEW COMPARISON:  Chest x-ray dated September 11, 2017. FINDINGS: Stable cardiomegaly status post CABG. Pulmonary vascular congestion and mild interstitial thickening is similar to prior study. Trace bilateral pleural effusions are unchanged. Patchy opacity in the left lower lobe appears unchanged and likely reflects atelectasis or scarring. Unchanged calcified pleural plaques. No pneumothorax. No acute osseous abnormality. IMPRESSION: 1. Stable cardiomegaly and mild interstitial edema. 2. No consolidation. Electronically Signed   By: Titus Dubin M.D.   On: 11/15/2017 17:08    Procedures Procedures (including critical care time)  Medications Ordered in ED Medications  aspirin EC tablet 81 mg (has no administration in time range)   clonazePAM (KLONOPIN) tablet 0.5 mg (has no administration in time range)  clopidogrel (PLAVIX) tablet 75 mg (has no administration in time range)  oxyCODONE-acetaminophen (PERCOCET/ROXICET) 5-325 MG per tablet 1 tablet (has no administration in time range)  metoprolol succinate (TOPROL-XL) 24 hr tablet 100 mg (has no administration in time range)  potassium chloride SA (K-DUR,KLOR-CON) CR tablet 20 mEq (has no administration in time range)  simvastatin (ZOCOR) tablet 20 mg (has no administration in time range)  torsemide (DEMADEX) tablet 100 mg (has no administration in time range)  ipratropium-albuterol (DUONEB) 0.5-2.5 (3) MG/3ML nebulizer solution 3 mL (3 mLs Nebulization Given 11/15/17 1720)  predniSONE (DELTASONE) tablet 60 mg (60 mg Oral Given 11/15/17 1747)  magnesium sulfate IVPB 2 g 50 mL (0 g Intravenous Stopped 11/15/17 1918)  levofloxacin (LEVAQUIN) IVPB 750 mg (0 mg Intravenous Stopped 11/15/17 1919)     Initial Impression / Assessment and Plan / ED Course  I have reviewed the triage vital signs and the nursing notes.  Pertinent labs & imaging results that were available during my care of the patient were reviewed by me and considered in my medical decision making (see chart for details).     62 yo M with a chief complaint of cough congestion and shortness of breath.  Likely has COPD exacerbation based on history and physical.  Chest x-ray looks similar to prior though the last time patient was x-rayed he also was having a similar illness.  Will treat with steroids duo nebs reassess.  The patient improved  on breathing treatments but is still hypoxic requiring 4-6 L of oxygen.  Will admit.  CRITICAL CARE Performed by: Cecilio Asper   Total critical care time: 80 minutes  Critical care time was exclusive of separately billable procedures and treating other patients.  Critical care was necessary to treat or prevent imminent or life-threatening deterioration.  Critical  care was time spent personally by me on the following activities: development of treatment plan with patient and/or surrogate as well as nursing, discussions with consultants, evaluation of patient's response to treatment, examination of patient, obtaining history from patient or surrogate, ordering and performing treatments and interventions, ordering and review of laboratory studies, ordering and review of radiographic studies, pulse oximetry and re-evaluation of patient's condition.  The patients results and plan were reviewed and discussed.   Any x-rays performed were independently reviewed by myself.   Differential diagnosis were considered with the presenting HPI.  Medications  aspirin EC tablet 81 mg (has no administration in time range)  clonazePAM (KLONOPIN) tablet 0.5 mg (has no administration in time range)  clopidogrel (PLAVIX) tablet 75 mg (has no administration in time range)  oxyCODONE-acetaminophen (PERCOCET/ROXICET) 5-325 MG per tablet 1 tablet (has no administration in time range)  metoprolol succinate (TOPROL-XL) 24 hr tablet 100 mg (has no administration in time range)  potassium chloride SA (K-DUR,KLOR-CON) CR tablet 20 mEq (has no administration in time range)  simvastatin (ZOCOR) tablet 20 mg (has no administration in time range)  torsemide (DEMADEX) tablet 100 mg (has no administration in time range)  ipratropium-albuterol (DUONEB) 0.5-2.5 (3) MG/3ML nebulizer solution 3 mL (3 mLs Nebulization Given 11/15/17 1720)  predniSONE (DELTASONE) tablet 60 mg (60 mg Oral Given 11/15/17 1747)  magnesium sulfate IVPB 2 g 50 mL (0 g Intravenous Stopped 11/15/17 1918)  levofloxacin (LEVAQUIN) IVPB 750 mg (0 mg Intravenous Stopped 11/15/17 1919)    Vitals:   11/15/17 1627 11/15/17 1641 11/15/17 1710  BP: (!) 156/76    Pulse: 75    Resp: 18    Temp: 98.2 F (36.8 C)    SpO2: (!) 73% 93% 92%    Final diagnoses:  COPD exacerbation ([place])    Admission/ observation were discussed with  the admitting physician, patient and/or family and they are comfortable with the plan.   Final Clinical Impressions(s) / ED Diagnoses   Final diagnoses:  COPD exacerbation [facility])    ED Discharge Orders    None       Deno Etienne, DO 11/15/17 1922

## 2017-11-15 NOTE — ED Notes (Signed)
ED TO INPATIENT HANDOFF REPORT  Name/Age/Gender [name] 62 y.o. male  Code Status    Code Status Orders  (From admission, onward)        Start     Ordered   11/15/17 1922  Full code  Continuous     11/15/17 1923    Code Status History    Date Active Date Inactive Code Status Order ID Comments User Context   01/12/2015 1938 01/19/2015 1914 Full Code [id number]  Hosie Poisson, MD Inpatient   12/28/2014 1446 12/29/2014 0339 Full Code [id number]  Aletta Edouard, MD HOV   12/11/2014 1753 12/12/2014 1652 Full Code [id number]  Annita Brod, MD Inpatient      Home/SNF/Other Home  Chief Complaint low O2/CHF/COPD  Level of Care/Admitting Diagnosis ED Disposition    ED Disposition Condition [facility] Area: [facility] [[id number]]  Level of Care: Med-Surg [16]  Diagnosis: Acute respiratory failure with hypoxia [facility]) [[id number]]  Admitting Physician: Doreatha Massed  Attending Physician: Etta Quill [phone]  Estimated length of stay: past midnight tomorrow  Certification:: I certify this patient will need inpatient services for at least 2 midnights  PT Class (Do Not Modify): Inpatient [101]  PT Acc Code (Do Not Modify): Private [1]       Medical History Past Medical History:  Diagnosis Date  . Anginal pain ([place])    not had to use in awhile  none in 10 years  . Anxiety   . Basal cell carcinoma of nose   . Blood dyscrasia    trouble clotting   . Carotid artery disease ([place])    s/p L CEA 2009 (hx of evacuation of hematoma due to heparin)  . CHF (congestive heart failure) ([place])   . Chronic lower back pain   . COPD (chronic obstructive pulmonary disease) ([place])   . Coronary artery disease    CABG 2005. s/p PTCA and stenting of the saphenous vein graft to PDA and saphenous vein graft to obtuse marginal by Dr. Burt Knack 09/29/11. Normal EF at cath 09/2011  . Depression   . Diabetes mellitus without complication (HCC)    borderline    . Dysrhythmia    fluttering  . Edema   . GERD (gastroesophageal reflux disease)   . Heart murmur   . 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 History of home oxygen therapy    2 liters at night prn  . Hyperlipidemia   . Hypertension   . Lymphoma ([place]) 12/21/2014  . Myocardial infarction ([place])   . Obesity   . Shortness of breath dyspnea    with exertion   . Sleep apnea    "used to"      could not use 2006    Allergies Allergies  Allergen Reactions  . Bendamustine Hcl Rash    See hospital notes from 01/12/15  . Heparin Other (See Comments)    Opposite reaction Heparin induced thrombocytopenia  . Tape Rash    IV Location/Drains/Wounds Patient Lines/Drains/Airways Status   Active Line/Drains/Airways    Name:   Placement date:   Placement time:   Site:   Days:   Peripheral IV 11/15/17 Left Hand   11/15/17    1723    Hand   less than 1   Incision (Closed) 12/11/14 Neck Left   12/11/14    0813     1070   Incision (Closed) 02/11/15 Hand Left   02/11/15    1632  1008   Wound / Incision (Open or Dehisced) 01/17/15 Other (Comment) Leg Right;Lower rle, blisters, sloughing,edematous   01/17/15    2000    Leg   1033          Labs/Imaging Results for orders placed or performed during the hospital encounter of 11/15/17 (from the past 48 hour(s))  I-stat troponin, ED     Status: None   Collection Time: 11/15/17  5:49 PM  Result Value Ref Range   Troponin i, poc 0.02 0.00 - 0.08 ng/mL   Comment 3            Comment: Due to the release kinetics of cTnI, a negative result within the first hours of the onset of symptoms does not rule out myocardial infarction with certainty. If myocardial infarction is still suspected, repeat the test at appropriate intervals.   I-Stat Chem 8, ED     Status: Abnormal   Collection Time: 11/15/17  5:50 PM  Result Value Ref Range   Sodium 138 135 - 145 mmol/L   Potassium 3.8 3.5 - 5.1 mmol/L   Chloride 91 (L) 101 - 111 mmol/L   BUN 13 6 -  20 mg/dL   Creatinine, Ser 1.00 0.61 - 1.24 mg/dL   Glucose, Bld 115 (H) 65 - 99 mg/dL   Calcium, Ion 1.08 (L) 1.15 - 1.40 mmol/L   TCO2 37 (H) 22 - 32 mmol/L   Hemoglobin 18.4 (H) 13.0 - 17.0 g/dL   HCT 54.0 (H) 39.0 - 52.0 %  Brain natriuretic peptide     Status: Abnormal   Collection Time: 11/15/17  5:52 PM  Result Value Ref Range   B Natriuretic Peptide 150.6 (H) 0.0 - 100.0 pg/mL    Comment: Performed at [facility], [place] [address], [place], [place] [address]  CBC with Differential     Status: Abnormal   Collection Time: 11/15/17  5:52 PM  Result Value Ref Range   WBC 10.5 4.0 - 10.5 K/uL   RBC 5.16 4.22 - 5.81 MIL/uL   Hemoglobin 17.4 (H) 13.0 - 17.0 g/dL   HCT 52.1 (H) 39.0 - 52.0 %   MCV 101.0 (H) 78.0 - 100.0 fL   MCH 33.7 26.0 - 34.0 pg   MCHC 33.4 30.0 - 36.0 g/dL   RDW 13.2 11.5 - 15.5 %   Platelets 139 (L) 150 - 400 K/uL   Neutrophils Relative % 74 %   Neutro Abs 7.7 1.7 - 7.7 K/uL   Lymphocytes Relative 10 %   Lymphs Abs 1.0 0.7 - 4.0 K/uL   Monocytes Relative 15 %   Monocytes Absolute 1.5 (H) 0.1 - 1.0 K/uL   Eosinophils Relative 1 %   Eosinophils Absolute 0.1 0.0 - 0.7 K/uL   Basophils Relative 0 %   Basophils Absolute 0.0 0.0 - 0.1 K/uL    Comment: Performed at [facility], [place] [address] [place],  [address]  Basic metabolic panel     Status: Abnormal   Collection Time: 11/15/17  5:52 PM  Result Value Ref Range   Sodium 138 135 - 145 mmol/L   Potassium 3.9 3.5 - 5.1 mmol/L   Chloride 92 (L) 101 - 111 mmol/L   CO2 34 (H) 22 - 32 mmol/L   Glucose, Bld 110 (H) 65 - 99 mg/dL   BUN 13 6 - 20 mg/dL   Creatinine, Ser 0.85 0.61 - 1.24 mg/dL   Calcium 8.9 8.9 - 10.3 mg/dL   GFR  calc non Af Amer >60 >60 mL/min   GFR calc Af Amer >60 >60 mL/min    Comment: (NOTE) The eGFR has been calculated using the CKD EPI equation. This calculation has not been validated in all clinical situations. eGFR's persistently <60  mL/min signify possible Chronic Kidney Disease.    Anion gap 12 5 - 15    Comment: Performed at [facility], [place] [address] [place], [place] [address]   Dg Chest 2 View  Result Date: 11/15/2017 CLINICAL DATA:  Fever, cough, congestion, and shortness of breath for the past week. EXAM: CHEST - 2 VIEW COMPARISON:  Chest x-ray dated September 11, 2017. FINDINGS: Stable cardiomegaly status post CABG. Pulmonary vascular congestion and mild interstitial thickening is similar to prior study. Trace bilateral pleural effusions are unchanged. Patchy opacity in the left lower lobe appears unchanged and likely reflects atelectasis or scarring. Unchanged calcified pleural plaques. No pneumothorax. No acute osseous abnormality. IMPRESSION: 1. Stable cardiomegaly and mild interstitial edema. 2. No consolidation. Electronically Signed   By: Titus Dubin M.D.   On: 11/15/2017 17:08    Pending Labs Unresulted Labs (From admission, onward)   Start     Ordered   11/15/17 1922  HIV antibody (Routine Testing)  Once,   R     11/15/17 1923      Vitals/Pain Today's Vitals   11/15/17 1627 11/15/17 1641 11/15/17 1710 11/15/17 2009  BP: (!) 156/76   140/61  Pulse: 75   76  Resp: 18   (!) 22  Temp: 98.2 F (36.8 C)     SpO2: (!) 73% 93% 92% 92%    Isolation Precautions No active isolations  Medications Medications  aspirin EC tablet 81 mg (has no administration in time range)  clonazePAM (KLONOPIN) tablet 0.5 mg (has no administration in time range)  clopidogrel (PLAVIX) tablet 75 mg (has no administration in time range)  oxyCODONE-acetaminophen (PERCOCET/ROXICET) 5-325 MG per tablet 1 tablet (has no administration in time range)  metoprolol succinate (TOPROL-XL) 24 hr tablet 100 mg (has no administration in time range)  potassium chloride SA (K-DUR,KLOR-CON) CR tablet 20 mEq (has no administration in time range)  simvastatin (ZOCOR) tablet 20 mg (has no administration in time range)   torsemide (DEMADEX) tablet 100 mg (has no administration in time range)  acetaminophen (TYLENOL) tablet 650 mg (has no administration in time range)    Or  acetaminophen (TYLENOL) suppository 650 mg (has no administration in time range)  ondansetron (ZOFRAN) tablet 4 mg (has no administration in time range)    Or  ondansetron (ZOFRAN) injection 4 mg (has no administration in time range)  levofloxacin (LEVAQUIN) tablet 750 mg (has no administration in time range)  predniSONE (DELTASONE) tablet 40 mg (has no administration in time range)  mometasone-formoterol (DULERA) 200-5 MCG/ACT inhaler 2 puff (has no administration in time range)  albuterol (PROVENTIL) (2.5 MG/3ML) 0.083% nebulizer solution 2.5 mg (has no administration in time range)  ipratropium (ATROVENT) nebulizer solution 0.5 mg (has no administration in time range)  insulin aspart (novoLOG) injection 0-15 Units (has no administration in time range)  ipratropium-albuterol (DUONEB) 0.5-2.5 (3) MG/3ML nebulizer solution 3 mL (3 mLs Nebulization Given 11/15/17 1720)  predniSONE (DELTASONE) tablet 60 mg (60 mg Oral Given 11/15/17 1747)  magnesium sulfate IVPB 2 g 50 mL (0 g Intravenous Stopped 11/15/17 1918)  levofloxacin (LEVAQUIN) IVPB 750 mg (0 mg Intravenous Stopped 11/15/17 1919)    Mobility walks

## 2017-11-15 NOTE — Telephone Encounter (Signed)
-----   Message from Michaelyn Barter, RN sent at 12/15/2016  3:52 PM EDT ----- Need carotid 12/2017

## 2017-11-15 NOTE — ED Triage Notes (Signed)
Pt sent from [place] for low O2 sats. Pt has hx of CHF and COPD, Pt complains of cough and congestion for the past week.

## 2017-11-15 NOTE — Telephone Encounter (Signed)
Patient is due for carotid duplex in May. Order is in, will send message to scheduling.

## 2017-11-15 NOTE — Progress Notes (Signed)
[name] is a 62 y.o. male with the following history as recorded in EpicCare:  Patient Active Problem List   Diagnosis Date Noted  . Port catheter in place 01/12/2017  . Skin lesion of back 04/28/2016  . Low back pain radiating to both legs 09/28/2015  . Boil of upper extremity 08/20/2015  . Bilateral leg ulcer ([place]) 06/25/2015  . URI (upper respiratory infection) 04/26/2015  . Rash 04/26/2015  . Basal cell carcinoma of back 04/08/2015  . Well adult exam 04/01/2015  . Cellulitis of hand, left 02/11/2015  . Vasculitis ([place])   . Cellulitis of right lower extremity   . Erythroderma   . Purpura ([place])   . RMSF [facility] spotted fever)   . Chronic diastolic CHF (congestive heart failure) ([place])   . Thrombocytopenia ([place])   . Coronary artery disease involving coronary bypass graft of native heart without angina pectoris   . Bilateral lower extremity edema 01/12/2015  . Hypersensitivity reaction 01/02/2015  . Asymptomatic hyperuricemia 12/30/2014  . 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 Mass of neck 12/11/2014    Class: Chronic  . Morbid obesity ([place]) 12/11/2014  . Diabetes mellitus type 2, controlled ([place]) 12/11/2014  . Depression 12/11/2014  . Neoplasm of uncertain behavior of skin 09/25/2014  . Neck mass 07/28/2014  . Insomnia 03/25/2014  . Erectile dysfunction 09/22/2013  . Chronic venous insufficiency 02/25/2013  . 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 Anxiety state 02/25/2013  . Murmur 09/03/2012  . Tobacco abuse 12/02/2010  . COPD mixed type ([place]) 06/29/2009  . G E R D 06/29/2009  . ENLARGEMENT OF LYMPH NODES 06/29/2009  . HEPARIN-INDUCED THROMBOCYTOPENIA 02/11/2009  . Edema 02/11/2009  . Carotid disease, bilateral ([place]) 02/11/2009  . Elevated lipids 05/23/2007  . Essential hypertension 05/23/2007  . Sleep apnea 05/23/2007    No current facility-administered medications for this visit.    Current  Outpatient Medications  Medication Sig Dispense Refill  . amoxicillin-clavulanate (AUGMENTIN) 875-125 MG tablet Take 1 tablet by mouth 2 (two) times daily. 20 tablet 0  . ANORO ELLIPTA 62.5-25 MCG/INH AEPB Inhale 1 puff into the lungs daily as needed. For wheezing & SOB 180 each 3  . aspirin EC 81 MG tablet Take 81 mg by mouth daily.    . clonazePAM (KLONOPIN) 0.5 MG tablet TAKE 1 TABLET TWICE A DAY AS NEEDED FOR ANXIETY OR AT BEDTIME FOR INSOMNIA 180 tablet 1  . clopidogrel (PLAVIX) 75 MG tablet TAKE 1 TABLET DAILY WITH   BREAKFAST 90 tablet 3  . KLOR-CON M20 20 MEQ tablet TAKE 1 TABLET DAILY 90 tablet 1  . metoprolol succinate (TOPROL-XL) 50 MG 24 hr tablet TAKE 2 TABLETS (100MG )     DAILY WITH OR IMMEDIATELY  FOLLOWING A MEAL 180 tablet 3  . nitroGLYCERIN (NITROSTAT) 0.4 MG SL tablet Place 1 tablet (0.4 mg total) under the tongue every 5 (five) minutes as needed for chest pain (Up to 3 doses). 25 tablet 1  . oxyCODONE-acetaminophen (PERCOCET/ROXICET) 5-325 MG tablet Take 1 tablet by mouth every 6 (six) hours as needed for severe pain. 120 tablet 0  . potassium chloride SA (KLOR-CON M20) 20 MEQ tablet Take 1 tablet (20 mEq total) by mouth daily. 90 tablet 0  . simvastatin (ZOCOR) 20 MG tablet TAKE 1 TABLET AT BEDTIME 90 tablet 3  . torsemide (DEMADEX) 100 MG tablet TAKE 1 TABLET DAILY 90 tablet 1   Facility-Administered Medications Ordered in Other Visits  Medication Dose Route Frequency Provider Last Rate Last Dose  . ipratropium-albuterol (DUONEB) 0.5-2.5 (3) MG/3ML nebulizer solution 3 mL  3 mL Nebulization Q5 min Tyrone Nine, Dan, DO      . magnesium sulfate IVPB 2 g 50 mL  2 g Intravenous Once Floyd, Dan, DO      . predniSONE (DELTASONE) tablet 60 mg  60 mg Oral Once Deno Etienne, DO        Allergies: Bendamustine hcl; Heparin; and Tape  Past Medical History:  Diagnosis Date  . Anginal pain ([place])    not had to use in awhile  none in 10 years  . Anxiety   . Basal cell carcinoma of nose   .  Blood dyscrasia    trouble clotting   . Carotid artery disease ([place])    s/p L CEA 2009 (hx of evacuation of hematoma due to heparin)  . CHF (congestive heart failure) ([place])   . Chronic lower back pain   . COPD (chronic obstructive pulmonary disease) ([place])   . Coronary artery disease    CABG 2005. s/p PTCA and stenting of the saphenous vein graft to PDA and saphenous vein graft to obtuse marginal by Dr. Burt Knack 09/29/11. Normal EF at cath 09/2011  . Depression   . Diabetes mellitus without complication (HCC)    borderline   . Dysrhythmia    fluttering  . Edema   . GERD (gastroesophageal reflux disease)   . Heart murmur   . 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 History of home oxygen therapy    2 liters at night prn  . Hyperlipidemia   . Hypertension   . Lymphoma ([place]) 12/21/2014  . Myocardial infarction ([place])   . Obesity   . Shortness of breath dyspnea    with exertion   . Sleep apnea    "used to"      could not use 2006    Past Surgical History:  Procedure Laterality Date  . BASAL CELL CARCINOMA EXCISION  2000's   nose  . CAROTID ENDARTERECTOMY  03/2008   left  . CORONARY ANGIOPLASTY WITH STENT PLACEMENT  09/29/11   "2"  . CORONARY ARTERY BYPASS GRAFT  2005   CABG X3  . evacuation of hematoma  03/2008   left neck; S/P endarterectomy; "cause heparin clotted it up"  . I&D EXTREMITY Left 02/11/2015   Procedure: IRRIGATION AND DEBRIDEMENT LEFT LONG FINGER;  Surgeon: Leanora Cover, MD;  Location: [facility];  Service: Orthopedics;  Laterality: Left;  I and D Left Long Finger  . IR REMOVAL TUN ACCESS W/ PORT W/O FL MOD SED  05/18/2017  . MASS EXCISION Left 12/11/2014   Procedure: EXCISIONAL BIOPSY OF LEFT SUPRA CLAVICULAR NECK MASS;  Surgeon: Jerrell Belfast, MD;  Location: [facility];  Service: ENT;  Laterality: Left;  . PERCUTANEOUS CORONARY STENT INTERVENTION (PCI-S) N/A 09/29/2011   Procedure: PERCUTANEOUS CORONARY STENT INTERVENTION (PCI-S);  Surgeon: Sherren Mocha,  MD;  [facility] LAB;  Service: Cardiovascular;  Laterality: N/A;  . PORTACATH PLACEMENT  2016  . SUPERFICIAL LYMPH NODE BIOPSY / EXCISION Left     Family History  Problem Relation Age of Onset  . Heart disease Father   . Bone cancer Mother     Social History   Tobacco Use  . Smoking status: Current Some Day Smoker    Packs/day: 2.00    Years: 41.00    Pack years: 82.00    Types: Cigarettes  . Smokeless tobacco: Former  User    Types: Chew  . Tobacco comment: consult entered  Substance Use Topics  . Alcohol use: No    Alcohol/week: 0.0 oz    Comment: 09/29/11 "quit years ago"    Subjective:  Patient presents with concerns for worsening cough/ congestion; his cough and physical appearance were so concerning that our office RN evaluated the patient in the waiting room; he was placed on 4L of O2 and sats came up to 95%; during course of office visit, attempted to d/c the oxygen and he would de-sat back into the high 70s/ low 80s within 5-10 minutes; patient notes that he has been feeling short of breath and that his lungs are "full of gunk." Has history of COPD and CHF; he states he has used oxygen at home in the past but does not currently have an active prescription.   Objective:  Vitals:   11/15/17 1448  BP: 134/60  Pulse: 94  Temp: 98.4 F (36.9 C)  TempSrc: Oral  SpO2: (!) 70%  Weight: (!) 322 lb (146.1 kg)  Height: 6' (1.829 m)    General: Well developed, well nourished, in no acute distress while on oxygen; color previous to starting the oxygen was very pale;  Skin : Warm and dry.  Head: Normocephalic and atraumatic  Eyes: Sclera and conjunctiva clear; pupils round and reactive to light; extraocular movements intact  Ears: External normal; canals clear; tympanic membranes normal  Oropharynx: Pink, supple. No suspicious lesions  Neck: Supple without thyromegaly, adenopathy  Lungs: Respirations unlabored; wheezing, coarse breath sounds in all 4 lobes CVS exam:  normal rate and regular rhythm.  Extremities: + bilateral pedal edema, cyanosis, clubbing  Vessels: Symmetric bilaterally  Neurologic: Alert and oriented; speech intact; face symmetrical; moves all extremities well; CNII-XII intact without focal deficit   Assessment:  1. Hypoxia     Plan:  Suspect CHF/ COPD exacerbation; patient is sent to ER for evaluation as he cannot maintain normal O2 sats on room air- his baseline without oxygen continues to drop into the 70s; patient agrees to ER but defers ambulance; he agrees to go directly to [facility] for evaluation; charge nurse notified; follow-up to be determined.   No follow-ups on file.  No orders of the defined types were placed in this encounter.   Requested Prescriptions    No prescriptions requested or ordered in this encounter

## 2017-11-15 NOTE — H&P (Signed)
History and Physical    [name]:[id number] DOB: Jun 25, 1956 DOA: 11/15/2017  PCP: [name], MD  Patient coming from: Home  I have personally briefly reviewed patient's old medical records in [place]  Chief Complaint: SOB  HPI: EDWORD CU is a 62 y.o. male with medical history significant of COPD, CHF, borderline DM, HTN, CABG.  Patient presents to the ED with c/o cough, SOB, wheezing.  This been going on for the past week.  Has been having change in sputum and increased sputum.  Went to see his pulmonologist today who found his oxygen saturation was in the 70s and sent him to the emergency department for evaluation.  The patient denies any increased fluid on his body.  He does not think this is heart failure.  Thinks it is related to his bronchitis.  He was seen a couple months ago for the same.  Was started on Augmentin and had improvement.  Denies overt fevers but states he has felt warm.  Has had some sick contacts at work.   ED Course: CXR shows mild pulm edema, unchanged from Jan.  WT 146 is baseline since Dec.  Given steroids, breathing treatments with improvement.   Review of Systems: As per HPI otherwise 10 point review of systems negative.   Past Medical History:  Diagnosis Date  . Anginal pain ([place])    not had to use in awhile  none in 10 years  . Anxiety   . Basal cell carcinoma of nose   . Blood dyscrasia    trouble clotting   . Carotid artery disease ([place])    s/p L CEA 2009 (hx of evacuation of hematoma due to heparin)  . CHF (congestive heart failure) ([place])   . Chronic lower back pain   . COPD (chronic obstructive pulmonary disease) ([place])   . Coronary artery disease    CABG 2005. s/p PTCA and stenting of the saphenous vein graft to PDA and saphenous vein graft to obtuse marginal by Dr. Burt Knack 09/29/11. Normal EF at cath 09/2011  . Depression   . Diabetes mellitus without complication (HCC)    borderline   . Dysrhythmia    fluttering  . Edema   . GERD (gastroesophageal reflux disease)   . Heart murmur   . 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 History of home oxygen therapy    2 liters at night prn  . Hyperlipidemia   . Hypertension   . Lymphoma ([place]) 12/21/2014  . Myocardial infarction ([place])   . Obesity   . Shortness of breath dyspnea    with exertion   . Sleep apnea    "used to"      could not use 2006    Past Surgical History:  Procedure Laterality Date  . BASAL CELL CARCINOMA EXCISION  2000's   nose  . CAROTID ENDARTERECTOMY  03/2008   left  . CORONARY ANGIOPLASTY WITH STENT PLACEMENT  09/29/11   "2"  . CORONARY ARTERY BYPASS GRAFT  2005   CABG X3  . evacuation of hematoma  03/2008   left neck; S/P endarterectomy; "cause heparin clotted it up"  . I&D EXTREMITY Left 02/11/2015   Procedure: IRRIGATION AND DEBRIDEMENT LEFT LONG FINGER;  Surgeon: Leanora Cover, MD;  Location: [facility];  Service: Orthopedics;  Laterality: Left;  I and D Left Long Finger  . IR REMOVAL TUN ACCESS W/ PORT W/O FL MOD SED  05/18/2017  . MASS EXCISION Left 12/11/2014  Procedure: EXCISIONAL BIOPSY OF LEFT SUPRA CLAVICULAR NECK MASS;  Surgeon: Jerrell Belfast, MD;  Location: [facility];  Service: ENT;  Laterality: Left;  . PERCUTANEOUS CORONARY STENT INTERVENTION (PCI-S) N/A 09/29/2011   Procedure: PERCUTANEOUS CORONARY STENT INTERVENTION (PCI-S);  Surgeon: Sherren Mocha, MD;  [facility];  Service: Cardiovascular;  Laterality: N/A;  . PORTACATH PLACEMENT  2016  . SUPERFICIAL LYMPH NODE BIOPSY / EXCISION Left      reports that he has been smoking cigarettes.  He has a 82.00 pack-year smoking history. He has quit using smokeless tobacco. His smokeless tobacco use included chew. He reports that he has current or past drug history. Drug: Marijuana. He reports that he does not drink alcohol.  Allergies  Allergen Reactions  . Bendamustine Hcl Rash    See hospital notes from 01/12/15  . Heparin Other  (See Comments)    Opposite reaction Heparin induced thrombocytopenia  . Tape Rash    Family History  Problem Relation Age of Onset  . Heart disease Father   . Bone cancer Mother      Prior to Admission medications   Medication Sig Start Date End Date Taking? Authorizing Provider  albuterol (PROVENTIL) (2.5 MG/3ML) 0.083% nebulizer solution Take 2.5 mg by nebulization daily as needed for wheezing or shortness of breath.   Yes [provider]  ANORO ELLIPTA 62.5-25 MCG/INH AEPB Inhale 1 puff into the lungs daily as needed. For wheezing & SOB 08/24/16  Yes Plotnikov, [name], MD  aspirin EC 81 MG tablet Take 81 mg by mouth daily.   Yes [provider]  clonazePAM (KLONOPIN) 0.5 MG tablet TAKE 1 TABLET TWICE A DAY AS NEEDED FOR ANXIETY OR AT BEDTIME FOR INSOMNIA 08/15/17  Yes Plotnikov, [name], MD  clopidogrel (PLAVIX) 75 MG tablet TAKE 1 TABLET DAILY WITH   BREAKFAST 09/16/17  Yes Plotnikov, [name], MD  KLOR-CON M20 20 MEQ tablet TAKE 1 TABLET DAILY 10/26/17  Yes Plotnikov, [name], MD  metoprolol succinate (TOPROL-XL) 50 MG 24 hr tablet TAKE 2 TABLETS (100MG )     DAILY WITH OR IMMEDIATELY  FOLLOWING A MEAL 04/27/17  Yes Plotnikov, [name], MD  oxyCODONE-acetaminophen (PERCOCET/ROXICET) 5-325 MG tablet Take 1 tablet by mouth every 6 (six) hours as needed for severe pain. 09/14/17  Yes Plotnikov, [name], MD  simvastatin (ZOCOR) 20 MG tablet TAKE 1 TABLET AT BEDTIME 10/25/17  Yes Plotnikov, [name], MD  torsemide (DEMADEX) 100 MG tablet TAKE 1 TABLET DAILY 05/22/17  Yes Plotnikov, [name], MD  nitroGLYCERIN (NITROSTAT) 0.4 MG SL tablet Place 1 tablet (0.4 mg total) under the tongue every 5 (five) minutes as needed for chest pain (Up to 3 doses). 01/21/16 10/09/17  [name], MD    Physical Exam: Vitals:   11/15/17 1627 11/15/17 1641 11/15/17 1710  BP: (!) 156/76    Pulse: 75    Resp: 18    Temp: 98.2 F (36.8 C)    SpO2: (!) 73% 93% 92%     Constitutional: NAD, calm, comfortable Eyes: PERRL, lids and conjunctivae normal ENMT: Mucous membranes are moist. Posterior pharynx clear of any exudate or lesions.Normal dentition.  Neck: normal, supple, no masses, no thyromegaly Respiratory: Still has diffuse wheezes and crackles Cardiovascular: Regular rate and rhythm, no murmurs / rubs / gallops. 2+ BLE edema. 2+ pedal pulses. No carotid bruits.  Abdomen: no tenderness, no masses palpated. No hepatosplenomegaly. Bowel sounds positive.  Musculoskeletal: no clubbing / cyanosis. No joint deformity upper  and lower extremities. Good ROM, no contractures. Normal muscle tone.  Skin: no rashes, lesions, ulcers. No induration Neurologic: CN 2-12 grossly intact. Sensation intact, DTR normal. Strength 5/5 in all 4.  Psychiatric: Normal judgment and insight. Alert and oriented x 3. Normal mood.    Labs on Admission: I have personally reviewed following labs and imaging studies  CBC: Recent Labs  Lab 11/15/17 1750 11/15/17 1752  WBC  --  10.5  NEUTROABS  --  7.7  HGB 18.4* 17.4*  HCT 54.0* 52.1*  MCV  --  101.0*  PLT  --  258*   Basic Metabolic Panel: Recent Labs  Lab 11/15/17 1750 11/15/17 1752  NA 138 138  K 3.8 3.9  CL 91* 92*  CO2  --  34*  GLUCOSE 115* 110*  BUN 13 13  CREATININE 1.00 0.85  CALCIUM  --  8.9   GFR: Estimated Creatinine Clearance: 132.1 mL/min (by C-G formula based on SCr of 0.85 mg/dL). Liver Function Tests: No results for input(s): AST, ALT, ALKPHOS, BILITOT, PROT, ALBUMIN in the last 168 hours. No results for input(s): LIPASE, AMYLASE in the last 168 hours. No results for input(s): AMMONIA in the last 168 hours. Coagulation Profile: No results for input(s): INR, PROTIME in the last 168 hours. Cardiac Enzymes: No results for input(s): CKTOTAL, CKMB, CKMBINDEX, TROPONINI in the last 168 hours. BNP (last 3 results) No results for input(s): PROBNP in the last 8760 hours. HbA1C: No results for  input(s): HGBA1C in the last 72 hours. CBG: No results for input(s): GLUCAP in the last 168 hours. Lipid Profile: No results for input(s): CHOL, HDL, LDLCALC, TRIG, CHOLHDL, LDLDIRECT in the last 72 hours. Thyroid Function Tests: No results for input(s): TSH, T4TOTAL, FREET4, T3FREE, THYROIDAB in the last 72 hours. Anemia Panel: No results for input(s): VITAMINB12, FOLATE, FERRITIN, TIBC, IRON, RETICCTPCT in the last 72 hours. Urine analysis:    Component Value Date/Time   COLORURINE LT. YELLOW 02/25/2013 1056   APPEARANCEUR CLEAR 02/25/2013 1056   LABSPEC 1.010 02/25/2013 1056   PHURINE 6.5 02/25/2013 1056   GLUCOSEU NEGATIVE 02/25/2013 1056   HGBUR NEGATIVE 02/25/2013 1056   BILIRUBINUR NEGATIVE 02/25/2013 1056   KETONESUR NEGATIVE 02/25/2013 1056   PROTEINUR NEGATIVE 03/16/2008 1010   UROBILINOGEN 1.0 02/25/2013 1056   NITRITE NEGATIVE 02/25/2013 1056   LEUKOCYTESUR NEGATIVE 02/25/2013 1056    Radiological Exams on Admission: Dg Chest 2 View  Result Date: 11/15/2017 CLINICAL DATA:  Fever, cough, congestion, and shortness of breath for the past week. EXAM: CHEST - 2 VIEW COMPARISON:  Chest x-ray dated September 11, 2017. FINDINGS: Stable cardiomegaly status post CABG. Pulmonary vascular congestion and mild interstitial thickening is similar to prior study. Trace bilateral pleural effusions are unchanged. Patchy opacity in the left lower lobe appears unchanged and likely reflects atelectasis or scarring. Unchanged calcified pleural plaques. No pneumothorax. No acute osseous abnormality. IMPRESSION: 1. Stable cardiomegaly and mild interstitial edema. 2. No consolidation. Electronically Signed   By: Titus Dubin M.D.   On: 11/15/2017 17:08    EKG: Independently reviewed.  Assessment/Plan Principal Problem:   COPD with acute exacerbation (HCC) Active Problems:   Essential hypertension   Diabetes mellitus type 2, controlled ([place])   Chronic diastolic CHF (congestive heart failure)  (HCC)   Acute respiratory failure with hypoxia ([place])    1. COPD exacerbation causing acute hypoxic resp failure - 1. COPD pathway 2. Scheduled and PRN nebs 3. Prednisone 4. levaquin 5. Adult wheeze protocol 2. HTN -  1. Cont home meds 3. DM2 - 1. Mod scale SSI AC while on steroids 4. CHF - 1. Continue home meds including torsemide 2. Acute exacerbation possible but felt less likely than COPD exacerbation because: 1. BNP only 150 2. CXR readings, while showing possible mild pulm edema, are unchanged from priors 3. 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

## 2017-11-16 ENCOUNTER — Other Ambulatory Visit: Payer: Self-pay

## 2017-11-16 LAB — GLUCOSE, CAPILLARY
GLUCOSE-CAPILLARY: 152 mg/dL — AB (ref 65–99)
GLUCOSE-CAPILLARY: 209 mg/dL — AB (ref 65–99)
Glucose-Capillary: 127 mg/dL — ABNORMAL HIGH (ref 65–99)
Glucose-Capillary: 158 mg/dL — ABNORMAL HIGH (ref 65–99)

## 2017-11-16 LAB — HIV ANTIBODY (ROUTINE TESTING W REFLEX): HIV Screen 4th Generation wRfx: NONREACTIVE

## 2017-11-16 MED ORDER — ALBUTEROL SULFATE (2.5 MG/3ML) 0.083% IN NEBU
2.5000 mg | INHALATION_SOLUTION | RESPIRATORY_TRACT | Status: DC | PRN
  Administered 2017-11-16: 2.5 mg via RESPIRATORY_TRACT
  Filled 2017-11-16: qty 3

## 2017-11-16 MED ORDER — DM-GUAIFENESIN ER 30-600 MG PO TB12
1.0000 | ORAL_TABLET | Freq: Two times a day (BID) | ORAL | Status: DC
  Administered 2017-11-16 – 2017-11-17 (×2): 1 via ORAL
  Filled 2017-11-16 (×2): qty 1

## 2017-11-16 MED ORDER — NICOTINE 14 MG/24HR TD PT24
14.0000 mg | MEDICATED_PATCH | Freq: Every day | TRANSDERMAL | Status: DC
  Administered 2017-11-16 – 2017-11-17 (×2): 14 mg via TRANSDERMAL
  Filled 2017-11-16 (×2): qty 1

## 2017-11-16 MED ORDER — METHYLPREDNISOLONE SODIUM SUCC 40 MG IJ SOLR
40.0000 mg | Freq: Two times a day (BID) | INTRAMUSCULAR | Status: DC
  Administered 2017-11-16 – 2017-11-17 (×2): 40 mg via INTRAVENOUS
  Filled 2017-11-16 (×2): qty 1

## 2017-11-16 NOTE — Progress Notes (Signed)
Nutrition Brief Note  Patient identified on the Malnutrition Screening Tool (MST) Report  Wt Readings from Last 15 Encounters:  11/16/17 (!) 322 lb (146.1 kg)  11/15/17 (!) 322 lb (146.1 kg)  09/14/17 (!) 329 lb (149.2 kg)  09/10/17 (!) 327 lb (148.3 kg)  07/16/17 (!) 325 lb (147.4 kg)  05/18/17 (!) 329 lb (149.2 kg)  04/30/17 (!) 329 lb (149.2 kg)  04/20/17 (!) 329 lb 3.2 oz (149.3 kg)  01/26/17 (!) 339 lb (153.8 kg)  01/12/17 (!) 342 lb 4.8 oz (155.3 kg)  12/07/16 (!) 340 lb 6.4 oz (154.4 kg)  11/03/16 (!) 342 lb 8 oz (155.4 kg)  10/27/16 (!) 349 lb 3.2 oz (158.4 kg)  08/25/16 (!) 360 lb 1.6 oz (163.3 kg)  08/01/16 (!) 352 lb (159.7 kg)   Patient with PMH significant for COPD, CHF, borderline DM, HTN, and CABG. Presents this admission with COPD exacerbation. Pt denies any loss in appetite PTA but states he is actively trying to lose weight by making lifestyle changes. He has limited sweets and has baked food verus fried. Pt has a history of CHF and takes lasix daily. Weight show in his last 15 encounters show a gradual decrease in weight. No skin breakdown recorded. Pt does not wish to have any intervention at this time.   Body mass index is 43.67 kg/m. Patient meets criteria for morbid obesity based on current BMI.   Current diet order is carb modified, patient is consuming approximately 100% of meals at this time. Labs and medications reviewed.   No nutrition interventions warranted at this time. If nutrition issues arise, please consult RD.   Mariana Single RD, LDN Clinical Nutrition Pager # [phone]

## 2017-11-16 NOTE — Progress Notes (Addendum)
PROGRESS NOTE    [name]  BZJ:[id number] DOB: March 07, 1956 DOA: 11/15/2017 PCP: [name], MD   Brief Narrative: Patient is a 62 year old male with past medical history of COPD not on home oxygen, CHF, diabetes, hypertension, coronary artery disease status post CABG, history of heparin-induced thrombocytopenia who presents to the emergency department with complaints of cough shortness of breath and wheezing presented going on for a week.  Patient also reports increased sputum production with change in the color.  Patient was reported to have seen his pulmonologist who found his oxygen saturation in the range of 70s and sent him to the emergency department for further evaluation.  Patient was admitted for the management of COPD exacerbation.  Assessment & Plan:   Principal Problem:   COPD with acute exacerbation ([place]) Active Problems:   Essential hypertension   Diabetes mellitus type 2, controlled ([place])   Chronic diastolic CHF (congestive heart failure) (HCC)   Acute respiratory failure with hypoxia (HCC)  COPD exacerbation: Patient has history of COPD, takes inhalers at home.  He was recommended to take oxygen at home but he did not do that in the past. Respiratory status has improved this morning.  Continue steroids and bronchodilators and antibiotics. If he qualifies, he needs oxygen on on discharge to home.  We will try to taper the oxygen. Continue with incentive spirometry. Should follow-up with his pulmonologist after the discharge.  Smoker: Patient continues to smoke 1 pack a day for last several years.  Counseled for smoking cessation.  Hypertension:Currently his blood pressure stable.  Continue his home medication.  Continue to monitor his blood pressure.  History of CHF: On torsemide at home. Also on Toprol. Continue these .Echocardiogram from 01/2016 shows normal left ventricular ejection fraction, grade 2 diastolic dysfunction. Chest x-ray on presentation  showed .Mild pulmonary edema.  No crackles auscultated on today's exam.  He does not have peripheral edema.  Hyperglycemia: Continue sliding scale insulin here.  Anticipate hyperglycemia especially when he is on steroids.  Obesity: Counseled on the importance of adherence to health diet and exercise.  Hyperlipidemia: Continue statin    DVT prophylaxis: SCD( H/O HIT) Code Status: Full Family Communication: Wife present at the bedside Disposition Plan: Home after improvement in the COPD exacerbation symptoms   Consultants: None  Procedures: None  Antimicrobials: Levaquin since 11/15/17  Subjective: Patient seen and examined at bedside this morning.  His respiratory status is improved but he is still wheezing and desaturates easily on room air.  Coughing has improved.  Objective: Vitals:   11/16/17 0808 11/16/17 0900 11/16/17 1207 11/16/17 1436  BP:      Pulse:      Resp:      Temp:      TempSrc:      SpO2: 92%  95% 92%  Weight:  (!) 146.1 kg (322 lb)    Height:  6' (1.829 m)      Intake/Output Summary (Last 24 hours) at 11/16/2017 1503 Last data filed at 11/16/2017 0900 Gross per 24 hour  Intake 560 ml  Output -  Net 560 ml   Filed Weights   11/16/17 0900  Weight: (!) 146.1 kg (322 lb)    Examination:  General exam: Appears calm and comfortable ,Not in distress,obese HEENT:PERRL,Oral mucosa moist, Ear/Nose normal on gross exam Respiratory system: Bilateral expiratory wheezes, bilateral decreased air entry Cardiovascular system: S1 & S2 heard, RRR. No JVD, murmurs, rubs, gallops or clicks. No pedal edema. Gastrointestinal system: Abdomen is nondistended,  soft and nontender. No organomegaly or masses felt. Normal bowel sounds heard. Central nervous system: Alert and oriented. No focal neurological deficits. Extremities: No edema, no clubbing ,no cyanosis, distal peripheral pulses palpable. Skin: No rashes, lesions or ulcers,no icterus ,no pallor MSK: Normal muscle  bulk,tone ,power Psychiatry: Judgement and insight appear normal. Mood & affect appropriate.     Data Reviewed: I have personally reviewed following labs and imaging studies  CBC: Recent Labs  Lab 11/15/17 1750 11/15/17 1752  WBC  --  10.5  NEUTROABS  --  7.7  HGB 18.4* 17.4*  HCT 54.0* 52.1*  MCV  --  101.0*  PLT  --  086*   Basic Metabolic Panel: Recent Labs  Lab 11/15/17 1750 11/15/17 1752  NA 138 138  K 3.8 3.9  CL 91* 92*  CO2  --  34*  GLUCOSE 115* 110*  BUN 13 13  CREATININE 1.00 0.85  CALCIUM  --  8.9   GFR: Estimated Creatinine Clearance: 132.1 mL/min (by C-G formula based on SCr of 0.85 mg/dL). Liver Function Tests: No results for input(s): AST, ALT, ALKPHOS, BILITOT, PROT, ALBUMIN in the last 168 hours. No results for input(s): LIPASE, AMYLASE in the last 168 hours. No results for input(s): AMMONIA in the last 168 hours. Coagulation Profile: No results for input(s): INR, PROTIME in the last 168 hours. Cardiac Enzymes: No results for input(s): CKTOTAL, CKMB, CKMBINDEX, TROPONINI in the last 168 hours. BNP (last 3 results) No results for input(s): PROBNP in the last 8760 hours. HbA1C: No results for input(s): HGBA1C in the last 72 hours. CBG: Recent Labs  Lab 11/15/17 2158 11/16/17 0738 11/16/17 1143  GLUCAP 184* 152* 127*   Lipid Profile: No results for input(s): CHOL, HDL, LDLCALC, TRIG, CHOLHDL, LDLDIRECT in the last 72 hours. Thyroid Function Tests: No results for input(s): TSH, T4TOTAL, FREET4, T3FREE, THYROIDAB in the last 72 hours. Anemia Panel: No results for input(s): VITAMINB12, FOLATE, FERRITIN, TIBC, IRON, RETICCTPCT in the last 72 hours. Sepsis Labs: No results for input(s): PROCALCITON, LATICACIDVEN in the last 168 hours.  No results found for this or any previous visit (from the past 240 hour(s)).       Radiology Studies: Dg Chest 2 View  Result Date: 11/15/2017 CLINICAL DATA:  Fever, cough, congestion, and shortness of  breath for the past week. EXAM: CHEST - 2 VIEW COMPARISON:  Chest x-ray dated September 11, 2017. FINDINGS: Stable cardiomegaly status post CABG. Pulmonary vascular congestion and mild interstitial thickening is similar to prior study. Trace bilateral pleural effusions are unchanged. Patchy opacity in the left lower lobe appears unchanged and likely reflects atelectasis or scarring. Unchanged calcified pleural plaques. No pneumothorax. No acute osseous abnormality. IMPRESSION: 1. Stable cardiomegaly and mild interstitial edema. 2. No consolidation. Electronically Signed   By: Titus Dubin M.D.   On: 11/15/2017 17:08        Scheduled Meds: . aspirin EC  81 mg Oral Daily  . clopidogrel  75 mg Oral Q breakfast  . insulin aspart  0-15 Units Subcutaneous TID WC  . ipratropium-albuterol  3 mL Nebulization TID  . levofloxacin  750 mg Oral Q24H  . methylPREDNISolone (SOLU-MEDROL) injection  40 mg Intravenous Q12H  . metoprolol succinate  100 mg Oral QHS  . potassium chloride SA  20 mEq Oral Daily  . simvastatin  20 mg Oral QHS  . torsemide  100 mg Oral Daily   Continuous Infusions:   LOS: 1 day    Time spent: 25  mins.More than 50% of that time was spent in counseling and/or coordination of care.   Please Note: This patient record was dictated using Editor, commissioning. Chart creation errors have been sought, but may not always have been located. Such creation errors do not reflect on the Standard of Medical Care.    Shelly Coss, MD [facility] Pager [phone]  If 7PM-7AM, please contact night-coverage [email or web address] Password [email or web address] 11/16/2017, 3:03 PM

## 2017-11-17 LAB — BASIC METABOLIC PANEL
Anion gap: 11 (ref 5–15)
BUN: 18 mg/dL (ref 6–20)
CO2: 35 mmol/L — ABNORMAL HIGH (ref 22–32)
Calcium: 9 mg/dL (ref 8.9–10.3)
Chloride: 93 mmol/L — ABNORMAL LOW (ref 101–111)
Creatinine, Ser: 0.77 mg/dL (ref 0.61–1.24)
GFR calc non Af Amer: >60 mL/min (ref >60)
Glucose, Bld: 161 mg/dL — ABNORMAL HIGH (ref 65–99)
POTASSIUM: 4.3 mmol/L (ref 3.5–5.1)
SODIUM: 139 mmol/L (ref 135–145)

## 2017-11-17 LAB — CBC WITH DIFFERENTIAL/PLATELET
Basophils Absolute: 0 10*3/uL (ref 0.0–0.1)
Basophils Relative: 0 %
EOS ABS: 0 10*3/uL (ref 0.0–0.7)
Eosinophils Relative: 0 %
HEMATOCRIT: 52.4 % — AB (ref 39.0–52.0)
HEMOGLOBIN: 16.2 g/dL (ref 13.0–17.0)
LYMPHS ABS: 1 10*3/uL (ref 0.7–4.0)
Lymphocytes Relative: 7 %
MCH: 32.5 pg (ref 26.0–34.0)
MCHC: 30.9 g/dL (ref 30.0–36.0)
MCV: 105.2 fL — ABNORMAL HIGH (ref 78.0–100.0)
Monocytes Absolute: 1.6 10*3/uL — ABNORMAL HIGH (ref 0.1–1.0)
Monocytes Relative: 11 %
NEUTROS ABS: 11.6 10*3/uL — AB (ref 1.7–7.7)
NEUTROS PCT: 82 %
Platelets: 173 10*3/uL (ref 150–400)
RBC: 4.98 MIL/uL (ref 4.22–5.81)
RDW: 13.2 % (ref 11.5–15.5)
WBC: 14.2 10*3/uL — AB (ref 4.0–10.5)

## 2017-11-17 LAB — GLUCOSE, CAPILLARY: GLUCOSE-CAPILLARY: 174 mg/dL — AB (ref 65–99)

## 2017-11-17 MED ORDER — PREDNISONE 20 MG PO TABS: 40.0000 mg | ORAL_TABLET | Freq: Every day | ORAL | 0 refills | Status: DC

## 2017-11-17 NOTE — Progress Notes (Signed)
SATURATION QUALIFICATIONS: (This note is used to comply with regulatory documentation for home oxygen)   Patient Saturations on Room Air at Rest = 83%   Patient Saturations on Room Air while Ambulating = N/A  Patient Saturations on N/A Liters of oxygen while Ambulating = N/A  Please briefly explain why patient needs home oxygen: Pt is 92% on 3L at rest

## 2017-11-17 NOTE — Progress Notes (Signed)
NCM spoke to pt and he has neb machine at home. Contacted [facility] for home oxygen. Will deliver portable oxygen to room prior to dc and oxygen concentrator to home. Jonnie Finner RN CCM Case Mgmt phone [phone]

## 2017-11-17 NOTE — Discharge Summary (Signed)
Physician Discharge Summary  [name] RXV:[id number] DOB: 11/16/1955 DOA: 11/15/2017  PCP: [name], MD  Admit date: 11/15/2017 Discharge date: 11/17/2017  Admitted From: Home Disposition: Home  Recommendations for Outpatient Follow-up:  1. Follow up with PCP in 1-2 weeks 2. Please obtain BMP/CBC in one week  Home Health: No Equipment/Devices: Oxygen 2 L continuous  Discharge Condition: Stable CODE STATUS: Full Diet recommendation: Heart healthy  Brief/Interim Summary:  #) COPD unknown Gold stage with acute exacerbation: Patient was given IV steroids and scheduled bronchodilators.  He was continued on his home long-acting beta agonist/inhaled corticosteroid.  He required 2 L supplemental oxygen despite numerous attempts to wean him from that oxygen.  He was discharged home on as needed bronchodilators, oral steroids and continue his oxygen to be weaned as an outpatient.  #) Coronary disease status post CABG: Patient was continued on home statin, clopidogrel 75 mg daily, aspirin 81 mg daily, metoprolol succinate 100 mg daily.  #) Hypertension/hyperlipidemia: Patient was continued on statin and beta-blocker.  #) Psych: Patient was continued on home PRN clonazepam.   Discharge Diagnoses:  Principal Problem:   COPD with acute exacerbation ([place]) Active Problems:   Elevated lipids   Essential hypertension   Morbid obesity (HCC)   Diabetes mellitus type 2, controlled ([place])   Chronic diastolic CHF (congestive heart failure) (HCC)   Acute respiratory failure with hypoxia [facility])    Discharge Instructions  Discharge Instructions    Call MD for:  persistant nausea and vomiting   Complete by:  As directed    Call MD for:  redness, tenderness, or signs of infection (pain, swelling, redness, odor or green/yellow discharge around incision site)   Complete by:  As directed    Call MD for:  severe uncontrolled pain   Complete by:  As directed    Call MD for:   temperature >100.4   Complete by:  As directed    Diet - low sodium heart healthy   Complete by:  As directed    Discharge instructions   Complete by:  As directed    Please follow-up with your primary care doctor in 1 week.   Increase activity slowly   Complete by:  As directed      Allergies as of 11/17/2017      Reactions   Bendamustine Hcl Rash   See hospital notes from 01/12/15   Heparin Other (See Comments)   Opposite reaction Heparin induced thrombocytopenia   Tape Rash      Medication List    TAKE these medications   albuterol (2.5 MG/3ML) 0.083% nebulizer solution Commonly known as:  PROVENTIL Take 2.5 mg by nebulization daily as needed for wheezing or shortness of breath.   ANORO ELLIPTA 62.5-25 MCG/INH Aepb Generic drug:  umeclidinium-vilanterol Inhale 1 puff into the lungs daily as needed. For wheezing & SOB   aspirin EC 81 MG tablet Take 81 mg by mouth daily.   clonazePAM 0.5 MG tablet Commonly known as:  KLONOPIN TAKE 1 TABLET TWICE A DAY AS NEEDED FOR ANXIETY OR AT BEDTIME FOR INSOMNIA   clopidogrel 75 MG tablet Commonly known as:  PLAVIX TAKE 1 TABLET DAILY WITH   BREAKFAST   KLOR-CON M20 20 MEQ tablet Generic drug:  potassium chloride SA TAKE 1 TABLET DAILY   metoprolol succinate 50 MG 24 hr tablet Commonly known as:  TOPROL-XL TAKE 2 TABLETS (100MG )     DAILY WITH OR IMMEDIATELY  FOLLOWING A MEAL   nitroGLYCERIN 0.4  MG SL tablet Commonly known as:  NITROSTAT Place 1 tablet (0.4 mg total) under the tongue every 5 (five) minutes as needed for chest pain (Up to 3 doses).   oxyCODONE-acetaminophen 5-325 MG tablet Commonly known as:  PERCOCET/ROXICET Take 1 tablet by mouth every 6 (six) hours as needed for severe pain.   predniSONE 20 MG tablet Commonly known as:  DELTASONE Take 2 tablets (40 mg total) by mouth daily.   simvastatin 20 MG tablet Commonly known as:  ZOCOR TAKE 1 TABLET AT BEDTIME   torsemide 100 MG tablet Commonly known as:   [place] 1 TABLET DAILY            Durable Medical Equipment  (From admission, onward)        Start     Ordered   11/17/17 1227  DME Oxygen  Once    Question Answer Comment  Mode or (Route) Nasal cannula   Frequency Continuous (stationary and portable oxygen unit needed)   Oxygen conserving device Yes   Oxygen delivery system Gas      11/17/17 1226   11/17/17 1000  For home use only DME oxygen  Once    Comments:  HHRT please evaluate for light weight portable POC- simplygo  Question Answer Comment  Mode or (Route) Nasal cannula   Liters per Minute 3   Frequency Continuous (stationary and portable oxygen unit needed)   Oxygen delivery system Gas      11/17/17 [place] Follow up.   Why:  will deliver portable oxygen to room and home oxygen concentrator to your home Contact information: [place] [id number] [phone]          Allergies  Allergen Reactions  . Bendamustine Hcl Rash    See hospital notes from 01/12/15  . Heparin Other (See Comments)    Opposite reaction Heparin induced thrombocytopenia  . Tape Rash    Consultations:  None   Procedures/Studies: Dg Chest 2 View  Result Date: 11/15/2017 CLINICAL DATA:  Fever, cough, congestion, and shortness of breath for the past week. EXAM: CHEST - 2 VIEW COMPARISON:  Chest x-ray dated September 11, 2017. FINDINGS: Stable cardiomegaly status post CABG. Pulmonary vascular congestion and mild interstitial thickening is similar to prior study. Trace bilateral pleural effusions are unchanged. Patchy opacity in the left lower lobe appears unchanged and likely reflects atelectasis or scarring. Unchanged calcified pleural plaques. No pneumothorax. No acute osseous abnormality. IMPRESSION: 1. Stable cardiomegaly and mild interstitial edema. 2. No consolidation. Electronically Signed   By: Titus Dubin M.D.   On: 11/15/2017 17:08     (Echo, Carotid, EGD, Colonoscopy, ERCP)    Subjective:   Discharge Exam: Vitals:   11/17/17 0525 11/17/17 0828  BP: (!) 127/53   Pulse: 64   Resp: 16   Temp: 98.1 F (36.7 C)   SpO2: 94% 94%   Vitals:   11/16/17 2053 11/16/17 2122 11/17/17 0525 11/17/17 0828  BP: (!) 132/59  (!) 127/53   Pulse: 77  64   Resp: 17  16   Temp: 99.1 F (37.3 C)  98.1 F (36.7 C)   TempSrc: Oral  Oral   SpO2: (!) 89% 91% 94% 94%  Weight:      Height:        General: Pt is alert, awake, not in acute distress Cardiovascular: RRR, S1/S2 +, no rubs, no gallops Respiratory: CTA  bilaterally, no wheezing, scattered rhonchi Abdominal: Soft, NT, ND, bowel sounds + Extremities: Trace lower extremity edema    The results of significant diagnostics from this hospitalization (including imaging, microbiology, ancillary and laboratory) are listed below for reference.     Microbiology: No results found for this or any previous visit (from the past 240 hour(s)).   Labs: BNP (last 3 results) Recent Labs    11/15/17 1752  BNP 831.5*   Basic Metabolic Panel: Recent Labs  Lab 11/15/17 1750 11/15/17 1752 11/17/17 0606  NA 138 138 139  K 3.8 3.9 4.3  CL 91* 92* 93*  CO2  --  34* 35*  GLUCOSE 115* 110* 161*  BUN 13 13 18   CREATININE 1.00 0.85 0.77  CALCIUM  --  8.9 9.0   Liver Function Tests: No results for input(s): AST, ALT, ALKPHOS, BILITOT, PROT, ALBUMIN in the last 168 hours. No results for input(s): LIPASE, AMYLASE in the last 168 hours. No results for input(s): AMMONIA in the last 168 hours. CBC: Recent Labs  Lab 11/15/17 1750 11/15/17 1752 11/17/17 0606  WBC  --  10.5 14.2*  NEUTROABS  --  7.7 11.6*  HGB 18.4* 17.4* 16.2  HCT 54.0* 52.1* 52.4*  MCV  --  101.0* 105.2*  PLT  --  139* 173   Cardiac Enzymes: No results for input(s): CKTOTAL, CKMB, CKMBINDEX, TROPONINI in the last 168 hours. BNP: Invalid input(s): POCBNP CBG: Recent Labs  Lab 11/16/17 0738  11/16/17 1143 11/16/17 1630 11/16/17 2057 11/17/17 0803  GLUCAP 152* 127* 209* 158* 174*   D-Dimer No results for input(s): DDIMER in the last 72 hours. Hgb A1c No results for input(s): HGBA1C in the last 72 hours. Lipid Profile No results for input(s): CHOL, HDL, LDLCALC, TRIG, CHOLHDL, LDLDIRECT in the last 72 hours. Thyroid function studies No results for input(s): TSH, T4TOTAL, T3FREE, THYROIDAB in the last 72 hours.  Invalid input(s): FREET3 Anemia work up No results for input(s): VITAMINB12, FOLATE, FERRITIN, TIBC, IRON, RETICCTPCT in the last 72 hours. Urinalysis    Component Value Date/Time   COLORURINE LT. YELLOW 02/25/2013 1056   APPEARANCEUR CLEAR 02/25/2013 1056   LABSPEC 1.010 02/25/2013 1056   PHURINE 6.5 02/25/2013 1056   GLUCOSEU NEGATIVE 02/25/2013 1056   HGBUR NEGATIVE 02/25/2013 1056   BILIRUBINUR NEGATIVE 02/25/2013 1056   KETONESUR NEGATIVE 02/25/2013 1056   PROTEINUR NEGATIVE 03/16/2008 1010   UROBILINOGEN 1.0 02/25/2013 1056   NITRITE NEGATIVE 02/25/2013 1056   LEUKOCYTESUR NEGATIVE 02/25/2013 1056   Sepsis Labs Invalid input(s): PROCALCITONIN,  WBC,  LACTICIDVEN Microbiology No results found for this or any previous visit (from the past 240 hour(s)).   Time coordinating discharge: Over 30 minutes  SIGNED:   Cristy Folks, MD  [facility] 11/17/2017, 12:27 PM   If 7PM-7AM, please contact night-coverage [email or web address] Password [email or web address]

## 2017-11-17 NOTE — Progress Notes (Signed)
[name] to be D/C'd Home per MD order.  Discussed prescriptions and follow up appointments with the patient. Prescriptions given to patient, medication list explained in detail. Pt verbalized understanding.  Allergies as of 11/17/2017      Reactions   Bendamustine Hcl Rash   See hospital notes from 01/12/15   Heparin Other (See Comments)   Opposite reaction Heparin induced thrombocytopenia   Tape Rash      Medication List    TAKE these medications   albuterol (2.5 MG/3ML) 0.083% nebulizer solution Commonly known as:  PROVENTIL Take 2.5 mg by nebulization daily as needed for wheezing or shortness of breath.   ANORO ELLIPTA 62.5-25 MCG/INH Aepb Generic drug:  umeclidinium-vilanterol Inhale 1 puff into the lungs daily as needed. For wheezing & SOB   aspirin EC 81 MG tablet Take 81 mg by mouth daily.   clonazePAM 0.5 MG tablet Commonly known as:  KLONOPIN TAKE 1 TABLET TWICE A DAY AS NEEDED FOR ANXIETY OR AT BEDTIME FOR INSOMNIA   clopidogrel 75 MG tablet Commonly known as:  PLAVIX TAKE 1 TABLET DAILY WITH   BREAKFAST   KLOR-CON M20 20 MEQ tablet Generic drug:  potassium chloride SA TAKE 1 TABLET DAILY   metoprolol succinate 50 MG 24 hr tablet Commonly known as:  TOPROL-XL TAKE 2 TABLETS (100MG )     DAILY WITH OR IMMEDIATELY  FOLLOWING A MEAL   nitroGLYCERIN 0.4 MG SL tablet Commonly known as:  NITROSTAT Place 1 tablet (0.4 mg total) under the tongue every 5 (five) minutes as needed for chest pain (Up to 3 doses).   oxyCODONE-acetaminophen 5-325 MG tablet Commonly known as:  PERCOCET/ROXICET Take 1 tablet by mouth every 6 (six) hours as needed for severe pain.   predniSONE 20 MG tablet Commonly known as:  DELTASONE Take 2 tablets (40 mg total) by mouth daily.   simvastatin 20 MG tablet Commonly known as:  ZOCOR TAKE 1 TABLET AT BEDTIME   torsemide 100 MG tablet Commonly known as:  [place] 1 TABLET DAILY            Durable Medical Equipment   (From admission, onward)        Start     Ordered   11/17/17 1227  DME Oxygen  Once    Question Answer Comment  Mode or (Route) Nasal cannula   Frequency Continuous (stationary and portable oxygen unit needed)   Oxygen conserving device Yes   Oxygen delivery system Gas      11/17/17 1226   11/17/17 1000  For home use only DME oxygen  Once    Comments:  HHRT please evaluate for light weight portable POC- simplygo  Question Answer Comment  Mode or (Route) Nasal cannula   Liters per Minute 3   Frequency Continuous (stationary and portable oxygen unit needed)   Oxygen delivery system Gas      11/17/17 1000      Vitals:   11/17/17 0525 11/17/17 0828  BP: (!) 127/53   Pulse: 64   Resp: 16   Temp: 98.1 F (36.7 C)   SpO2: 94% 94%    Skin clean, dry and intact without evidence of skin break down, no evidence of skin tears noted. IV catheter discontinued intact. Site without signs and symptoms of complications. Dressing and pressure applied. Pt denies pain at this time. No complaints noted.  An After Visit Summary was printed and given to the patient. Patient escorted via [place], and D/C home via private  autoHaywood Lasso BSN, RN [facility] Phone [phone]

## 2017-11-17 NOTE — Discharge Instructions (Signed)

## 2017-11-18 ENCOUNTER — Other Ambulatory Visit: Payer: Self-pay | Admitting: Internal Medicine

## 2017-11-19 ENCOUNTER — Telehealth: Payer: Self-pay | Admitting: Cardiovascular Disease

## 2017-11-19 DIAGNOSIS — R0602 Shortness of breath: Secondary | ICD-10-CM

## 2017-11-19 NOTE — Telephone Encounter (Signed)
He had COPD exacerbation of lungs Should have echo before he sees [place] in f/u to  Reassess EF No signs of MI during hospital staty

## 2017-11-19 NOTE — Telephone Encounter (Signed)
Called patient back about his call. Patient stated he recently was in the hospital and put on oxygen. Patient wanted to know if this has anything to do with his heart. Patient is due for a 6 month f/u appointment, made patient an appointment next week. Patient wants to know if he needs any testing. Patient is due for carotid US. Made patient an appointment for carotid US on this Wednesday. Will forward to Dr. Johnsie Cancel for further advisement.

## 2017-11-19 NOTE — Telephone Encounter (Signed)
Called patient back to let him know Dr. Kyla Balzarine recommendations. Will have schedule call patient for an echo appointment.

## 2017-11-19 NOTE — Telephone Encounter (Signed)
[name] is calling because he is wanting to know if Dr. Johnsie Cancel may want him to order any test . He is on oxygen and his heart rate seems a little fast even with the oxygen . He was placed on oxygen because he had some shortness of breath due to the Bronchitis . Just discharged from the hospital on yesterday . Please call   Thanks

## 2017-11-20 NOTE — Progress Notes (Signed)
Patient ID: [name], male   DOB: 09-28-1955, 62 y.o.   MRN: [id number]   62 y.o. obes male f/u CAD. CABG in 2005, left CEA 2009, HTN, HLD, GERD, OSA and COPD History of follicular lymphoma diagnosed In 2016 and ? Melanoma on back PET/CT no recurrent cancer done March 2018  Last cath 09/29/11 reviewed Patent LIMA to LAD. Received DES to SVG Ramus and DES to SVG PDA  D/C from hospital for COPD exacerbation 11/17/17 Rx iv steroids inhalers and oxygen which he was to be weaned from as outpatient No cardiac issues during stay with BNP only 150  Echo reviewed 11/23/17 EF 32-12% normal diastolic parameters no significant valve disease   He still has dyspnea and some right sided chest pain. Feels fatigued with constant mucous congestion.   ROS: Denies fever, malais, weight loss, blurry vision, decreased visual acuity, cough, sputum, SOB, hemoptysis, pleuritic pain, palpitaitons, heartburn, abdominal pain, melena, lower extremity edema, claudication, or rash.  All other systems reviewed and negative  General: BP 122/62   Pulse 71   Ht 6' (1.829 m)   Wt (!) 326 lb (147.9 kg)   SpO2 (!) 86%   BMI 44.21 kg/m  Affect appropriate Obese male  HEENT: normal Neck supple with no adenopathy JVP normal no bruits no thyromegaly Lungs Expiratory  wheezing and good diaphragmatic motion Heart:  S1/S2 SEM murmur, no rub, gallop or click PMI normal Abdomen: benighn, BS positve, no tenderness, no AAA no bruit.  No HSM or HJR Distal pulses intact with no bruits Plus 2 LE edema Neuro non-focal Skin warm and dry No muscular weakness    Current Outpatient Medications  Medication Sig Dispense Refill  . albuterol (PROVENTIL) (2.5 MG/3ML) 0.083% nebulizer solution Take 2.5 mg by nebulization daily as needed for wheezing or shortness of breath.    [name] ELLIPTA 62.5-25 MCG/INH AEPB Inhale 1 puff into the lungs daily as needed. For wheezing & SOB 180 each 3  . aspirin EC 81 MG tablet Take 81 mg by  mouth daily.    . clonazePAM (KLONOPIN) 0.5 MG tablet TAKE 1 TABLET TWICE A DAY AS NEEDED FOR ANXIETY OR AT BEDTIME FOR INSOMNIA 180 tablet 1  . clopidogrel (PLAVIX) 75 MG tablet TAKE 1 TABLET DAILY WITH   BREAKFAST 90 tablet 3  . KLOR-CON M20 20 MEQ tablet TAKE 1 TABLET DAILY 90 tablet 1  . metoprolol succinate (TOPROL-XL) 50 MG 24 hr tablet TAKE 2 TABLETS (100MG )     DAILY WITH OR IMMEDIATELY  FOLLOWING A MEAL 180 tablet 3  . oxyCODONE-acetaminophen (PERCOCET/ROXICET) 5-325 MG tablet Take 1 tablet by mouth every 6 (six) hours as needed for severe pain. 120 tablet 0  . predniSONE (DELTASONE) 20 MG tablet Take 2 tablets (40 mg total) by mouth daily. 8 tablet 0  . simvastatin (ZOCOR) 20 MG tablet TAKE 1 TABLET AT BEDTIME 90 tablet 3  . torsemide (DEMADEX) 100 MG tablet TAKE 1 TABLET DAILY 90 tablet 3  . nitroGLYCERIN (NITROSTAT) 0.4 MG SL tablet Place 1 tablet (0.4 mg total) under the tongue every 5 (five) minutes as needed for chest pain (Up to 3 doses). 25 tablet 1   No current facility-administered medications for this visit.     Allergies  Bendamustine hcl; Heparin; and Tape  Electrocardiogram:  06/27/13  SR rate 60  RBBB  Today SR rate 68  ICRBBB LAE   01/21/16  SR rate 66 RBBB Pac  12/07/16  SR rate 63 RBBB PR  212   Assessment and Plan CAD:  CABG 2005  Cath 2013 with stent to SVG IM and SVG PDA Some chest Pain and ongoing dyspnea with hypoxemia. Needs right and left cath with grafts To further assess Given body size non invasive testing not a good option Risks discussed Not clear if case can be done from left radial and right antecubital vein. Increased risk of bleeding from femoral  Edema:  Related to obesity and chemo and chronic venous disease  Continue demedex   Lymphoma:  F/u oncology PET scan with no hypermetabolic spots  continue chemo  Lymphadenopathy much better Two more rounds of immunoRx with rituxen  June and August    Chol:   Lab Results  Component Value Date    [place] 70 03/25/2014   Bruit:  Post Left CEA with residual 40-59% bilateral disease ASA  F/u duplex in a year   Murmur:  AV murmur reviewed echo with no significant valve disease  COPD: with likely OSA recent hospitalization with exacerbation requiring IV steroids and d/c on home oxygen Still with abnormal lung exam and need for oxygen with home sats in mid 80's Refer back to Dr [name]. He has OSA and tried CPAP in past but will not wear it now  Cath lab called orders written and will have pre cath labs today   [name]

## 2017-11-20 NOTE — H&P (View-Only) (Signed)
Patient ID: [name], male   DOB: 24-Jun-1956, 62 y.o.   MRN: [id number]   62 y.o. obes male f/u CAD. CABG in 2005, left CEA 2009, HTN, HLD, GERD, OSA and COPD History of follicular lymphoma diagnosed In 2016 and ? Melanoma on back PET/CT no recurrent cancer done March 2018  Last cath 09/29/11 reviewed Patent LIMA to LAD. Received DES to SVG Ramus and DES to SVG PDA  D/C from hospital for COPD exacerbation 11/17/17 Rx iv steroids inhalers and oxygen which he was to be weaned from as outpatient No cardiac issues during stay with BNP only 150  Echo reviewed 11/23/17 EF 50-93% normal diastolic parameters no significant valve disease   He still has dyspnea and some right sided chest pain. Feels fatigued with constant mucous congestion.   ROS: Denies fever, malais, weight loss, blurry vision, decreased visual acuity, cough, sputum, SOB, hemoptysis, pleuritic pain, palpitaitons, heartburn, abdominal pain, melena, lower extremity edema, claudication, or rash.  All other systems reviewed and negative  General: BP 122/62   Pulse 71   Ht 6' (1.829 m)   Wt (!) 326 lb (147.9 kg)   SpO2 (!) 86%   BMI 44.21 kg/m  Affect appropriate Obese male  HEENT: normal Neck supple with no adenopathy JVP normal no bruits no thyromegaly Lungs Expiratory  wheezing and good diaphragmatic motion Heart:  S1/S2 SEM murmur, no rub, gallop or click PMI normal Abdomen: benighn, BS positve, no tenderness, no AAA no bruit.  No HSM or HJR Distal pulses intact with no bruits Plus 2 LE edema Neuro non-focal Skin warm and dry No muscular weakness    Current Outpatient Medications  Medication Sig Dispense Refill  . albuterol (PROVENTIL) (2.5 MG/3ML) 0.083% nebulizer solution Take 2.5 mg by nebulization daily as needed for wheezing or shortness of breath.    [name] ELLIPTA 62.5-25 MCG/INH AEPB Inhale 1 puff into the lungs daily as needed. For wheezing & SOB 180 each 3  . aspirin EC 81 MG tablet Take 81 mg by  mouth daily.    . clonazePAM (KLONOPIN) 0.5 MG tablet TAKE 1 TABLET TWICE A DAY AS NEEDED FOR ANXIETY OR AT BEDTIME FOR INSOMNIA 180 tablet 1  . clopidogrel (PLAVIX) 75 MG tablet TAKE 1 TABLET DAILY WITH   BREAKFAST 90 tablet 3  . KLOR-CON M20 20 MEQ tablet TAKE 1 TABLET DAILY 90 tablet 1  . metoprolol succinate (TOPROL-XL) 50 MG 24 hr tablet TAKE 2 TABLETS (100MG )     DAILY WITH OR IMMEDIATELY  FOLLOWING A MEAL 180 tablet 3  . oxyCODONE-acetaminophen (PERCOCET/ROXICET) 5-325 MG tablet Take 1 tablet by mouth every 6 (six) hours as needed for severe pain. 120 tablet 0  . predniSONE (DELTASONE) 20 MG tablet Take 2 tablets (40 mg total) by mouth daily. 8 tablet 0  . simvastatin (ZOCOR) 20 MG tablet TAKE 1 TABLET AT BEDTIME 90 tablet 3  . torsemide (DEMADEX) 100 MG tablet TAKE 1 TABLET DAILY 90 tablet 3  . nitroGLYCERIN (NITROSTAT) 0.4 MG SL tablet Place 1 tablet (0.4 mg total) under the tongue every 5 (five) minutes as needed for chest pain (Up to 3 doses). 25 tablet 1   No current facility-administered medications for this visit.     Allergies  Bendamustine hcl; Heparin; and Tape  Electrocardiogram:  06/27/13  SR rate 60  RBBB  Today SR rate 68  ICRBBB LAE   01/21/16  SR rate 66 RBBB Pac  12/07/16  SR rate 63 RBBB PR  212   Assessment and Plan CAD:  CABG 2005  Cath 2013 with stent to SVG IM and SVG PDA Some chest Pain and ongoing dyspnea with hypoxemia. Needs right and left cath with grafts To further assess Given body size non invasive testing not a good option Risks discussed Not clear if case can be done from left radial and right antecubital vein. Increased risk of bleeding from femoral  Edema:  Related to obesity and chemo and chronic venous disease  Continue demedex   Lymphoma:  F/u oncology PET scan with no hypermetabolic spots  continue chemo  Lymphadenopathy much better Two more rounds of immunoRx with rituxen  June and August    Chol:   Lab Results  Component Value Date    [place] 70 03/25/2014   Bruit:  Post Left CEA with residual 40-59% bilateral disease ASA  F/u duplex in a year   Murmur:  AV murmur reviewed echo with no significant valve disease  COPD: with likely OSA recent hospitalization with exacerbation requiring IV steroids and d/c on home oxygen Still with abnormal lung exam and need for oxygen with home sats in mid 80's Refer back to Dr [name] Caller. He has OSA and tried CPAP in past but will not wear it now  Cath lab called orders written and will have pre cath labs today   Jenkins Rouge

## 2017-11-21 ENCOUNTER — Ambulatory Visit (HOSPITAL_COMMUNITY): Admission: RE | Admit: 2017-11-21 | Payer: BLUE CROSS/BLUE SHIELD | Source: Ambulatory Visit

## 2017-11-21 ENCOUNTER — Other Ambulatory Visit: Payer: Self-pay | Admitting: Cardiovascular Disease

## 2017-11-21 DIAGNOSIS — J449 Chronic obstructive pulmonary disease, unspecified: Secondary | ICD-10-CM | POA: Diagnosis not present

## 2017-11-21 DIAGNOSIS — I6523 Occlusion and stenosis of bilateral carotid arteries: Secondary | ICD-10-CM

## 2017-11-23 ENCOUNTER — Other Ambulatory Visit: Payer: Self-pay

## 2017-11-23 ENCOUNTER — Ambulatory Visit (HOSPITAL_COMMUNITY): Payer: BLUE CROSS/BLUE SHIELD | Attending: Cardiovascular Disease

## 2017-11-23 DIAGNOSIS — I509 Heart failure, unspecified: Secondary | ICD-10-CM | POA: Insufficient documentation

## 2017-11-23 DIAGNOSIS — I872 Venous insufficiency (chronic) (peripheral): Secondary | ICD-10-CM | POA: Insufficient documentation

## 2017-11-23 DIAGNOSIS — E119 Type 2 diabetes mellitus without complications: Secondary | ICD-10-CM | POA: Insufficient documentation

## 2017-11-23 DIAGNOSIS — I110 Hypertensive heart disease with heart failure: Secondary | ICD-10-CM | POA: Diagnosis not present

## 2017-11-23 DIAGNOSIS — J449 Chronic obstructive pulmonary disease, unspecified: Secondary | ICD-10-CM | POA: Insufficient documentation

## 2017-11-23 DIAGNOSIS — I2510 Atherosclerotic heart disease of native coronary artery without angina pectoris: Secondary | ICD-10-CM | POA: Insufficient documentation

## 2017-11-23 DIAGNOSIS — I77810 Thoracic aortic ectasia: Secondary | ICD-10-CM | POA: Diagnosis not present

## 2017-11-23 DIAGNOSIS — R0602 Shortness of breath: Secondary | ICD-10-CM | POA: Insufficient documentation

## 2017-11-28 ENCOUNTER — Encounter: Payer: Self-pay | Admitting: Cardiovascular Disease

## 2017-11-28 ENCOUNTER — Other Ambulatory Visit (HOSPITAL_COMMUNITY): Payer: Self-pay | Admitting: Cardiovascular Disease

## 2017-11-28 ENCOUNTER — Ambulatory Visit (INDEPENDENT_AMBULATORY_CARE_PROVIDER_SITE_OTHER): Payer: BLUE CROSS/BLUE SHIELD | Admitting: Cardiovascular Disease

## 2017-11-28 VITALS — BP 122/62 | HR 71 | Ht 72.0 in | Wt 326.0 lb

## 2017-11-28 DIAGNOSIS — R0602 Shortness of breath: Secondary | ICD-10-CM | POA: Diagnosis not present

## 2017-11-28 DIAGNOSIS — R079 Chest pain, unspecified: Secondary | ICD-10-CM

## 2017-11-28 DIAGNOSIS — R0600 Dyspnea, unspecified: Secondary | ICD-10-CM

## 2017-11-28 NOTE — Patient Instructions (Addendum)
Medication Instructions:  Your physician recommends that you continue on your current medications as directed. Please refer to the Current Medication list given to you today.  Labwork: NONE  Testing/Procedures: Your physician has requested that you have a cardiac catheterization. Cardiac catheterization is used to diagnose and/or treat various heart conditions. Doctors may recommend this procedure for a number of different reasons. The most common reason is to evaluate chest pain. Chest pain can be a symptom of coronary artery disease (CAD), and cardiac catheterization can show whether plaque is narrowing or blocking your heart's arteries. This procedure is also used to evaluate the valves, as well as measure the blood flow and oxygen levels in different parts of your heart. For further information please visit [email or web address]. Please follow instruction sheet, as given.   Follow-Up: Your physician recommends that you schedule a follow-up appointment in: 3 weeks with PA or NP for post cath follow up.   Your physician wants you to follow-up in: 3 months with Dr. Johnsie Cancel.   You have been referred to Dr. [name] Caller with pulmonary.    If you need a refill on your cardiac medications before your next appointment, please call your pharmacy.     [facility] [address] [place] [address] Dept: [phone] Loc: [place]  11/28/2017  You are scheduled for a Cardiac Catheterization on Friday, April 26 with Dr. Lauree Chandler.  1. Please arrive at the [facility] (Main Entrance A) at [facility]: [place], [place] [address] at 7:00 AM (two hours before your procedure to ensure your preparation). Free valet parking service is available.   Special note: Every effort is made to have your procedure done on time. Please understand that emergencies  sometimes delay scheduled procedures.  2. Diet: Do not eat or drink anything after midnight prior to your procedure except sips of water to take medications.  3. Labs: None needed.  4. Medication instructions in preparation for your procedure:  Stop taking Torsemide on Friday, April 26.    On the morning of your procedure, take your Aspirin, Plavix and any morning medicines NOT listed above.  You may use sips of water.  5. Plan for one night stay--bring personal belongings. 6. Bring a current list of your medications and current insurance cards. 7. You MUST have a responsible person to drive you home. 8. Someone MUST be with you the first 24 hours after you arrive home or your discharge will be delayed. 9. Please wear clothes that are easy to get on and off and wear slip-on shoes.  Thank you for allowing [place] to care for you!   -- [facility]

## 2017-11-29 LAB — BASIC METABOLIC PANEL
BUN/Creatinine Ratio: 19 (ref 10–24)
BUN: 13 mg/dL (ref 8–27)
CO2: 35 mmol/L — AB (ref 20–29)
Calcium: 9.3 mg/dL (ref 8.6–10.2)
Chloride: 94 mmol/L — ABNORMAL LOW (ref 96–106)
Creatinine, Ser: 0.68 mg/dL — ABNORMAL LOW (ref 0.76–1.27)
GFR calc Af Amer: 117 mL/min/{1.73_m2} (ref >59)
GFR calc non Af Amer: 102 mL/min/{1.73_m2} (ref >59)
GLUCOSE: 153 mg/dL — AB (ref 65–99)
POTASSIUM: 3.9 mmol/L (ref 3.5–5.2)
SODIUM: 142 mmol/L (ref 134–144)

## 2017-11-29 LAB — PROTIME-INR
INR: 1 (ref 0.8–1.2)
Prothrombin Time: 10.6 s (ref 9.1–12.0)

## 2017-11-29 LAB — CBC WITH DIFFERENTIAL/PLATELET
Basophils Absolute: 0 10*3/uL (ref 0.0–0.2)
Basos: 0 %
EOS (ABSOLUTE): 0.1 10*3/uL (ref 0.0–0.4)
Eos: 1 %
HEMATOCRIT: 46.3 % (ref 37.5–51.0)
Hemoglobin: 15.1 g/dL (ref 13.0–17.7)
IMMATURE GRANULOCYTES: 0 %
Immature Grans (Abs): 0 10*3/uL (ref 0.0–0.1)
Lymphocytes Absolute: 1.8 10*3/uL (ref 0.7–3.1)
Lymphs: 14 %
MCH: 32.2 pg (ref 26.6–33.0)
MCHC: 32.6 g/dL (ref 31.5–35.7)
MCV: 99 fL — AB (ref 79–97)
MONOS ABS: 0.3 10*3/uL (ref 0.1–0.9)
Monocytes: 2 %
NEUTROS PCT: 83 %
Neutrophils Absolute: 10.5 10*3/uL — ABNORMAL HIGH (ref 1.4–7.0)
PLATELETS: 143 10*3/uL — AB (ref 150–379)
RBC: 4.69 x10E6/uL (ref 4.14–5.80)
RDW: 13.5 % (ref 12.3–15.4)
WBC: 12.7 10*3/uL — AB (ref 3.4–10.8)

## 2017-12-06 ENCOUNTER — Other Ambulatory Visit: Payer: Self-pay

## 2017-12-06 ENCOUNTER — Telehealth: Payer: Self-pay | Admitting: *Deleted

## 2017-12-06 MED ORDER — ANORO ELLIPTA 62.5-25 MCG/INH IN AEPB: 1.0000 | INHALATION_SPRAY | Freq: Every day | RESPIRATORY_TRACT | 3 refills | Status: DC | PRN

## 2017-12-06 NOTE — Telephone Encounter (Signed)
Pt returning your call

## 2017-12-06 NOTE — Telephone Encounter (Addendum)
Catheterization scheduled at [facility] for: Friday December 07, 2017 9 AM Verify arrival time and place: [facility] Entrance A/[place] at: 7 AM Nothing to eat or drink after midnight prior to cath. Verified allergies in Epic.  Hold: Torsemide AM of cath [facility] AM of cath  Except hold medications AM meds can be  taken pre-cath with sip of water including: ASA 81 mg Clopidogrel 75 mg   Confirm patient has responsible person to drive home post procedure and observe patient for 24 hours: yes  Discussed instructions with patient, he verbalized understanding.

## 2017-12-07 ENCOUNTER — Encounter (HOSPITAL_COMMUNITY): Payer: Self-pay | Admitting: General Practice

## 2017-12-07 ENCOUNTER — Ambulatory Visit (HOSPITAL_COMMUNITY)
Admission: RE | Disposition: A | Discharge status: Home / Self Care | Payer: Self-pay | Source: Ambulatory Visit | Attending: Cardiovascular Disease

## 2017-12-07 ENCOUNTER — Other Ambulatory Visit: Payer: Self-pay

## 2017-12-07 ENCOUNTER — Ambulatory Visit (HOSPITAL_COMMUNITY)
Admission: RE | Admit: 2017-12-07 | Discharge: 2017-12-08 | Disposition: A | Discharge status: Home / Self Care | Payer: BLUE CROSS/BLUE SHIELD | Source: Ambulatory Visit | Attending: Cardiovascular Disease | Admitting: Cardiovascular Disease

## 2017-12-07 DIAGNOSIS — Z7952 Long term (current) use of systemic steroids: Secondary | ICD-10-CM | POA: Diagnosis not present

## 2017-12-07 DIAGNOSIS — I200 Unstable angina: Secondary | ICD-10-CM | POA: Diagnosis present

## 2017-12-07 DIAGNOSIS — Z7902 Long term (current) use of antithrombotics/antiplatelets: Secondary | ICD-10-CM | POA: Insufficient documentation

## 2017-12-07 DIAGNOSIS — G4733 Obstructive sleep apnea (adult) (pediatric): Secondary | ICD-10-CM | POA: Diagnosis not present

## 2017-12-07 DIAGNOSIS — I110 Hypertensive heart disease with heart failure: Secondary | ICD-10-CM | POA: Insufficient documentation

## 2017-12-07 DIAGNOSIS — I872 Venous insufficiency (chronic) (peripheral): Secondary | ICD-10-CM | POA: Diagnosis present

## 2017-12-07 DIAGNOSIS — C8590 Non-Hodgkin lymphoma, unspecified, unspecified site: Secondary | ICD-10-CM | POA: Insufficient documentation

## 2017-12-07 DIAGNOSIS — Z6841 Body Mass Index (BMI) 40.0 and over, adult: Secondary | ICD-10-CM | POA: Insufficient documentation

## 2017-12-07 DIAGNOSIS — T82855A Stenosis of coronary artery stent, initial encounter: Secondary | ICD-10-CM | POA: Insufficient documentation

## 2017-12-07 DIAGNOSIS — E785 Hyperlipidemia, unspecified: Secondary | ICD-10-CM | POA: Diagnosis not present

## 2017-12-07 DIAGNOSIS — I2510 Atherosclerotic heart disease of native coronary artery without angina pectoris: Secondary | ICD-10-CM

## 2017-12-07 DIAGNOSIS — I25710 Atherosclerosis of autologous vein coronary artery bypass graft(s) with unstable angina pectoris: Secondary | ICD-10-CM | POA: Insufficient documentation

## 2017-12-07 DIAGNOSIS — J449 Chronic obstructive pulmonary disease, unspecified: Secondary | ICD-10-CM | POA: Diagnosis not present

## 2017-12-07 DIAGNOSIS — Z9861 Coronary angioplasty status: Secondary | ICD-10-CM

## 2017-12-07 DIAGNOSIS — Z7982 Long term (current) use of aspirin: Secondary | ICD-10-CM | POA: Insufficient documentation

## 2017-12-07 DIAGNOSIS — R0902 Hypoxemia: Secondary | ICD-10-CM | POA: Insufficient documentation

## 2017-12-07 DIAGNOSIS — R0600 Dyspnea, unspecified: Secondary | ICD-10-CM

## 2017-12-07 DIAGNOSIS — Y831 Surgical operation with implant of artificial internal device as the cause of abnormal reaction of the patient, or of later complication, without mention of misadventure at the time of the procedure: Secondary | ICD-10-CM | POA: Insufficient documentation

## 2017-12-07 DIAGNOSIS — I5032 Chronic diastolic (congestive) heart failure: Secondary | ICD-10-CM | POA: Insufficient documentation

## 2017-12-07 DIAGNOSIS — I2582 Chronic total occlusion of coronary artery: Secondary | ICD-10-CM | POA: Diagnosis not present

## 2017-12-07 DIAGNOSIS — I739 Peripheral vascular disease, unspecified: Secondary | ICD-10-CM

## 2017-12-07 DIAGNOSIS — I272 Pulmonary hypertension, unspecified: Secondary | ICD-10-CM | POA: Insufficient documentation

## 2017-12-07 DIAGNOSIS — Z955 Presence of coronary angioplasty implant and graft: Secondary | ICD-10-CM

## 2017-12-07 DIAGNOSIS — Z951 Presence of aortocoronary bypass graft: Secondary | ICD-10-CM

## 2017-12-07 DIAGNOSIS — I779 Disorder of arteries and arterioles, unspecified: Secondary | ICD-10-CM | POA: Diagnosis present

## 2017-12-07 HISTORY — PX: CORONARY STENT INTERVENTION: CATH118234

## 2017-12-07 HISTORY — PX: CARDIAC CATHETERIZATION: SHX172

## 2017-12-07 HISTORY — PX: RIGHT/LEFT HEART CATH AND CORONARY/GRAFT ANGIOGRAPHY: CATH118267

## 2017-12-07 LAB — POCT I-STAT 3, VENOUS BLOOD GAS (G3P V)
ACID-BASE EXCESS: 5 mmol/L — AB (ref 0.0–2.0)
BICARBONATE: 33.1 mmol/L — AB (ref 20.0–28.0)
O2 Saturation: 66 %
PH VEN: 7.319 (ref 7.250–7.430)
PO2 VEN: 39 mmHg (ref 32.0–45.0)
TCO2: 35 mmol/L — ABNORMAL HIGH (ref 22–32)
pCO2, Ven: 64.4 mmHg — ABNORMAL HIGH (ref 44.0–60.0)

## 2017-12-07 LAB — POCT I-STAT 3, ART BLOOD GAS (G3+)
ACID-BASE EXCESS: 3 mmol/L — AB (ref 0.0–2.0)
Bicarbonate: 30 mmol/L — ABNORMAL HIGH (ref 20.0–28.0)
O2 SAT: 96 %
PCO2 ART: 54.4 mmHg — AB (ref 32.0–48.0)
PO2 ART: 88 mmHg (ref 83.0–108.0)
TCO2: 32 mmol/L (ref 22–32)
pH, Arterial: 7.349 — ABNORMAL LOW (ref 7.350–7.450)

## 2017-12-07 LAB — POCT ACTIVATED CLOTTING TIME: ACTIVATED CLOTTING TIME: 301 s

## 2017-12-07 SURGERY — RIGHT/LEFT HEART CATH AND CORONARY/GRAFT ANGIOGRAPHY
Anesthesia: LOCAL

## 2017-12-07 MED ORDER — FENTANYL CITRATE (PF) 100 MCG/2ML IJ SOLN
INTRAMUSCULAR | Status: DC | PRN
  Administered 2017-12-07 (×4): 50 ug via INTRAVENOUS

## 2017-12-07 MED ORDER — ALBUTEROL SULFATE (2.5 MG/3ML) 0.083% IN NEBU
2.5000 mg | INHALATION_SOLUTION | Freq: Every day | RESPIRATORY_TRACT | Status: DC | PRN
Start: 2017-12-07 — End: 2017-12-08

## 2017-12-07 MED ORDER — SODIUM CHLORIDE 0.9% FLUSH: 3.0000 mL | Freq: Two times a day (BID) | INTRAVENOUS | Status: DC

## 2017-12-07 MED ORDER — ASPIRIN 81 MG PO CHEW: 81.0000 mg | CHEWABLE_TABLET | ORAL | Status: DC

## 2017-12-07 MED ORDER — NITROGLYCERIN 0.4 MG SL SUBL: 0.4000 mg | SUBLINGUAL_TABLET | SUBLINGUAL | Status: DC | PRN

## 2017-12-07 MED ORDER — OXYCODONE-ACETAMINOPHEN 5-325 MG PO TABS
1.0000 | ORAL_TABLET | ORAL | Status: DC | PRN
  Administered 2017-12-07 – 2017-12-08 (×3): 2 via ORAL
  Filled 2017-12-07 (×3): qty 2

## 2017-12-07 MED ORDER — BIVALIRUDIN TRIFLUOROACETATE 250 MG IV SOLR
INTRAVENOUS | Status: AC
  Filled 2017-12-07: qty 250

## 2017-12-07 MED ORDER — IOPAMIDOL (ISOVUE-370) INJECTION 76%
INTRAVENOUS | Status: AC
  Filled 2017-12-07: qty 100

## 2017-12-07 MED ORDER — METOPROLOL SUCCINATE ER 25 MG PO TB24
100.0000 mg | ORAL_TABLET | Freq: Every evening | ORAL | Status: DC
  Administered 2017-12-07: 19:00:00 100 mg via ORAL
  Filled 2017-12-07 (×2): qty 4

## 2017-12-07 MED ORDER — CLOPIDOGREL BISULFATE 300 MG PO TABS
ORAL_TABLET | ORAL | Status: AC
  Filled 2017-12-07: qty 1

## 2017-12-07 MED ORDER — FENTANYL CITRATE (PF) 100 MCG/2ML IJ SOLN
INTRAMUSCULAR | Status: AC
  Filled 2017-12-07: qty 2

## 2017-12-07 MED ORDER — MIDAZOLAM HCL 2 MG/2ML IJ SOLN
INTRAMUSCULAR | Status: DC | PRN
  Administered 2017-12-07 (×2): 2 mg via INTRAVENOUS

## 2017-12-07 MED ORDER — LIDOCAINE HCL (PF) 1 % IJ SOLN
INTRAMUSCULAR | Status: DC | PRN
  Administered 2017-12-07: 20 mL

## 2017-12-07 MED ORDER — SODIUM CHLORIDE 0.9 % IV SOLN: 250.0000 mL | INTRAVENOUS | Status: DC | PRN

## 2017-12-07 MED ORDER — ACETAMINOPHEN 325 MG PO TABS: 650.0000 mg | ORAL_TABLET | ORAL | Status: DC | PRN

## 2017-12-07 MED ORDER — HYDRALAZINE HCL 20 MG/ML IJ SOLN
5.0000 mg | INTRAMUSCULAR | Status: AC | PRN
Start: 2017-12-07 — End: 2017-12-07

## 2017-12-07 MED ORDER — SODIUM CHLORIDE 0.9% FLUSH: 3.0000 mL | INTRAVENOUS | Status: DC | PRN

## 2017-12-07 MED ORDER — CLOPIDOGREL BISULFATE 300 MG PO TABS
ORAL_TABLET | ORAL | Status: DC | PRN
  Administered 2017-12-07: 300 mg via ORAL

## 2017-12-07 MED ORDER — MORPHINE SULFATE (PF) 4 MG/ML IV SOLN
INTRAVENOUS | Status: AC
  Filled 2017-12-07: qty 1

## 2017-12-07 MED ORDER — SIMVASTATIN 20 MG PO TABS
20.0000 mg | ORAL_TABLET | Freq: Every day | ORAL | Status: DC
  Administered 2017-12-07: 20 mg via ORAL
  Filled 2017-12-07: qty 1

## 2017-12-07 MED ORDER — MIDAZOLAM HCL 2 MG/2ML IJ SOLN
INTRAMUSCULAR | Status: AC
  Filled 2017-12-07: qty 2

## 2017-12-07 MED ORDER — SODIUM CHLORIDE 0.9 % IV SOLN: INTRAVENOUS | Status: AC

## 2017-12-07 MED ORDER — TORSEMIDE 100 MG PO TABS
100.0000 mg | ORAL_TABLET | Freq: Every evening | ORAL | Status: DC
  Filled 2017-12-07: qty 1

## 2017-12-07 MED ORDER — BIVALIRUDIN BOLUS VIA INFUSION - CUPID
INTRAVENOUS | Status: DC | PRN
  Administered 2017-12-07: 115.65 mg via INTRAVENOUS

## 2017-12-07 MED ORDER — SODIUM CHLORIDE 0.9% FLUSH
3.0000 mL | Freq: Two times a day (BID) | INTRAVENOUS | Status: DC
  Administered 2017-12-08: 3 mL via INTRAVENOUS

## 2017-12-07 MED ORDER — CLOPIDOGREL BISULFATE 75 MG PO TABS
75.0000 mg | ORAL_TABLET | Freq: Every day | ORAL | Status: DC
  Administered 2017-12-08: 08:00:00 75 mg via ORAL
  Filled 2017-12-07: qty 1

## 2017-12-07 MED ORDER — IOPAMIDOL (ISOVUE-370) INJECTION 76%
INTRAVENOUS | Status: AC
  Filled 2017-12-07: qty 125

## 2017-12-07 MED ORDER — ASPIRIN EC 81 MG PO TBEC: 81.0000 mg | DELAYED_RELEASE_TABLET | Freq: Every evening | ORAL | Status: DC

## 2017-12-07 MED ORDER — SODIUM CHLORIDE 0.9 % WEIGHT BASED INFUSION
3.0000 mL/kg/h | INTRAVENOUS | Status: DC
  Administered 2017-12-07: 3 mL/kg/h via INTRAVENOUS

## 2017-12-07 MED ORDER — SODIUM CHLORIDE 0.9 % WEIGHT BASED INFUSION: 1.0000 mL/kg/h | INTRAVENOUS | Status: DC

## 2017-12-07 MED ORDER — LIDOCAINE HCL (PF) 1 % IJ SOLN
INTRAMUSCULAR | Status: AC
  Filled 2017-12-07: qty 30

## 2017-12-07 MED ORDER — LABETALOL HCL 5 MG/ML IV SOLN: 10.0000 mg | INTRAVENOUS | Status: AC | PRN

## 2017-12-07 MED ORDER — ONDANSETRON HCL 4 MG/2ML IJ SOLN: 4.0000 mg | Freq: Four times a day (QID) | INTRAMUSCULAR | Status: DC | PRN

## 2017-12-07 MED ORDER — CLONAZEPAM 0.5 MG PO TABS
0.5000 mg | ORAL_TABLET | Freq: Every day | ORAL | Status: DC
  Administered 2017-12-07: 0.5 mg via ORAL
  Filled 2017-12-07: qty 1

## 2017-12-07 MED ORDER — MORPHINE SULFATE (PF) 10 MG/ML IV SOLN
2.0000 mg | INTRAVENOUS | Status: DC | PRN
  Administered 2017-12-07: 2 mg via INTRAVENOUS

## 2017-12-07 MED ORDER — SODIUM CHLORIDE 0.9 % IV SOLN
INTRAVENOUS | Status: DC | PRN
  Administered 2017-12-07 (×2): 1.75 mg/kg/h via INTRAVENOUS

## 2017-12-07 SURGICAL SUPPLY — 22 items
BALLN SAPPHIRE ~~LOC~~ 3.0X15 (BALLOONS) ×1 IMPLANT
BALLN WOLVERINE 4.00X10 (BALLOONS) ×2
BALLN ~~LOC~~ EMERGE MR 4.5X15 (BALLOONS) ×2
BALLOON WOLVERINE 4.00X10 (BALLOONS) IMPLANT
BALLOON ~~LOC~~ EMERGE MR 4.5X15 (BALLOONS) IMPLANT
CATH INFINITI 5 FR LCB (CATHETERS) ×1 IMPLANT
CATH INFINITI 5FR MULTPACK ANG (CATHETERS) ×1 IMPLANT
CATH SWAN GANZ 7F STRAIGHT (CATHETERS) ×1 IMPLANT
CATHETER LAUNCHER 6FR RCB (CATHETERS) ×1 IMPLANT
COVER PRB 48X5XTLSCP FOLD TPE (BAG) IMPLANT
COVER PROBE 5X48 (BAG) ×2
KIT ENCORE 26 ADVANTAGE (KITS) ×1 IMPLANT
KIT HEART LEFT (KITS) ×2 IMPLANT
PACK CARDIAC CATHETERIZATION (CUSTOM PROCEDURE TRAY) ×2 IMPLANT
SHEATH AVANTI 11CM 6FR (SHEATH) ×1 IMPLANT
SHEATH AVANTI 11CM 7FR (SHEATH) ×1 IMPLANT
SHEATH PINNACLE 5F 10CM (SHEATH) ×1 IMPLANT
STENT RESOLUTE ONYX 4.0X22 (Permanent Stent) ×1 IMPLANT
TRANSDUCER W/STOPCOCK (MISCELLANEOUS) ×2 IMPLANT
TUBING CIL FLEX 10 FLL-RA (TUBING) ×2 IMPLANT
WIRE COUGAR XT STRL 190CM (WIRE) ×1 IMPLANT
WIRE EMERALD 3MM-J .035X150CM (WIRE) ×1 IMPLANT

## 2017-12-07 NOTE — Progress Notes (Signed)
Site area: rt groin fa and fv sheaths Site Prior to Removal:  Level 0 Pressure Applied For: 20 minutes Manual:   yes Patient Status During Pull:  stable Post Pull Site:  Level 0 Post Pull Instructions Given:  yes Post Pull Pulses Present: palpable rt dp Dressing Applied:  Gauze and tegaderm Bedrest begins @ 1220 Comments:

## 2017-12-07 NOTE — Interval H&P Note (Signed)
History and Physical Interval Note:  [name]  has presented today for cardiac cath with the diagnosis of dyspnea. The various methods of treatment have been discussed with the patient and family. After consideration of risks, benefits and other options for treatment, the patient has consented to  Procedure(s): RIGHT/LEFT HEART CATH AND CORONARY/GRAFT ANGIOGRAPHY (N/A) as a surgical intervention .  The patient's history has been reviewed, patient examined, no change in status, stable for surgery.  I have reviewed the patient's chart and labs.  Questions were answered to the patient's satisfaction.     Cath Lab Visit (complete for each Cath Lab visit)  Clinical Evaluation Leading to the Procedure:   ACS: No.  Non-ACS:    Anginal Classification: CCS II  Anti-ischemic medical therapy: Minimal Therapy (1 class of medications)  Non-Invasive Test Results: No non-invasive testing performed  Prior CABG: Previous CABG        Lauree Chandler

## 2017-12-08 DIAGNOSIS — I5032 Chronic diastolic (congestive) heart failure: Secondary | ICD-10-CM | POA: Diagnosis not present

## 2017-12-08 DIAGNOSIS — C8590 Non-Hodgkin lymphoma, unspecified, unspecified site: Secondary | ICD-10-CM | POA: Diagnosis not present

## 2017-12-08 DIAGNOSIS — E785 Hyperlipidemia, unspecified: Secondary | ICD-10-CM | POA: Diagnosis not present

## 2017-12-08 DIAGNOSIS — J449 Chronic obstructive pulmonary disease, unspecified: Secondary | ICD-10-CM | POA: Diagnosis not present

## 2017-12-08 DIAGNOSIS — Z6841 Body Mass Index (BMI) 40.0 and over, adult: Secondary | ICD-10-CM | POA: Diagnosis not present

## 2017-12-08 DIAGNOSIS — T82855A Stenosis of coronary artery stent, initial encounter: Secondary | ICD-10-CM | POA: Diagnosis not present

## 2017-12-08 DIAGNOSIS — Z7982 Long term (current) use of aspirin: Secondary | ICD-10-CM | POA: Diagnosis not present

## 2017-12-08 DIAGNOSIS — I25710 Atherosclerosis of autologous vein coronary artery bypass graft(s) with unstable angina pectoris: Secondary | ICD-10-CM | POA: Diagnosis not present

## 2017-12-08 DIAGNOSIS — Z9861 Coronary angioplasty status: Secondary | ICD-10-CM

## 2017-12-08 DIAGNOSIS — I200 Unstable angina: Secondary | ICD-10-CM | POA: Diagnosis not present

## 2017-12-08 DIAGNOSIS — I110 Hypertensive heart disease with heart failure: Secondary | ICD-10-CM | POA: Diagnosis not present

## 2017-12-08 DIAGNOSIS — I2510 Atherosclerotic heart disease of native coronary artery without angina pectoris: Secondary | ICD-10-CM

## 2017-12-08 DIAGNOSIS — Z7902 Long term (current) use of antithrombotics/antiplatelets: Secondary | ICD-10-CM | POA: Diagnosis not present

## 2017-12-08 DIAGNOSIS — R0902 Hypoxemia: Secondary | ICD-10-CM | POA: Diagnosis not present

## 2017-12-08 DIAGNOSIS — I272 Pulmonary hypertension, unspecified: Secondary | ICD-10-CM | POA: Diagnosis not present

## 2017-12-08 DIAGNOSIS — I872 Venous insufficiency (chronic) (peripheral): Secondary | ICD-10-CM | POA: Diagnosis not present

## 2017-12-08 DIAGNOSIS — G4733 Obstructive sleep apnea (adult) (pediatric): Secondary | ICD-10-CM | POA: Diagnosis not present

## 2017-12-08 DIAGNOSIS — I2582 Chronic total occlusion of coronary artery: Secondary | ICD-10-CM | POA: Diagnosis not present

## 2017-12-08 LAB — CBC
HCT: 43.2 % (ref 39.0–52.0)
HEMOGLOBIN: 13.9 g/dL (ref 13.0–17.0)
MCH: 32.6 pg (ref 26.0–34.0)
MCHC: 32.2 g/dL (ref 30.0–36.0)
MCV: 101.2 fL — ABNORMAL HIGH (ref 78.0–100.0)
PLATELETS: 138 10*3/uL — AB (ref 150–400)
RBC: 4.27 MIL/uL (ref 4.22–5.81)
RDW: 13.7 % (ref 11.5–15.5)
WBC: 9.9 10*3/uL (ref 4.0–10.5)

## 2017-12-08 LAB — BASIC METABOLIC PANEL
ANION GAP: 6 (ref 5–15)
BUN: 12 mg/dL (ref 6–20)
CO2: 32 mmol/L (ref 22–32)
Calcium: 8.6 mg/dL — ABNORMAL LOW (ref 8.9–10.3)
Chloride: 100 mmol/L — ABNORMAL LOW (ref 101–111)
Creatinine, Ser: 0.77 mg/dL (ref 0.61–1.24)
Glucose, Bld: 108 mg/dL — ABNORMAL HIGH (ref 65–99)
Potassium: 4.6 mmol/L (ref 3.5–5.1)
SODIUM: 138 mmol/L (ref 135–145)

## 2017-12-08 MED ORDER — ANGIOPLASTY BOOK
Freq: Once | Status: AC
  Administered 2017-12-08: 06:00:00
  Filled 2017-12-08: qty 1

## 2017-12-08 MED ORDER — ACETAMINOPHEN 325 MG PO TABS: 650.0000 mg | ORAL_TABLET | Freq: Four times a day (QID) | ORAL | Status: DC | PRN

## 2017-12-08 NOTE — Progress Notes (Signed)
Patient had 29 beats of VT, patient currenly asleep in better, we will put monitor by the bedside and continue to monitor

## 2017-12-08 NOTE — Discharge Instructions (Signed)
Coronary Angiogram With Stent, Care After °This sheet gives you information about how to care for yourself after your procedure. Your health care provider may also give you more specific instructions. If you have problems or questions, contact your health care provider. °What can I expect after the procedure? °After your procedure, it is common to have: °· Bruising in the area where a small, thin tube (catheter) was inserted. This usually fades within 1-2 weeks. °· Blood collecting in the tissue (hematoma) that may be painful to the touch. It should usually decrease in size and tenderness within 1-2 weeks. ° °Follow these instructions at home: °Insertion area care °· Do not take baths, swim, or use a hot tub until your health care provider approves. °· You may shower 24-48 hours after the procedure or as directed by your health care provider. °· Follow instructions from your health care provider about how to take care of your incision. Make sure you: °? Wash your hands with soap and water before you change your bandage (dressing). If soap and water are not available, use hand sanitizer. °? Change your dressing as told by your health care provider. °? Leave stitches (sutures), skin glue, or adhesive strips in place. These skin closures may need to stay in place for 2 weeks or longer. If adhesive strip edges start to loosen and curl up, you may trim the loose edges. Do not remove adhesive strips completely unless your health care provider tells you to do that. °· Remove the bandage (dressing) and gently wash the catheter insertion site with plain soap and water. °· Pat the area dry with a clean towel. Do not rub the area, because that may cause bleeding. °· Do not apply powder or lotion to the incision area. °· Check your incision area every day for signs of infection. Check for: °? More redness, swelling, or pain. °? More fluid or blood. °? Warmth. °? Pus or a bad smell. °Activity °· Do not drive for 24 hours if you  were given a medicine to help you relax (sedative). °· Do not lift anything that is heavier than 10 lb (4.5 kg) for 5 days after your procedure or as directed by your health care provider. °· Ask your health care provider when it is okay for you: °? To return to work or school. °? To resume usual physical activities or sports. °? To resume sexual activity. °Eating and drinking °· Eat a heart-healthy diet. This should include plenty of fresh fruits and vegetables. °· Avoid the following types of food: °? Food that is high in salt. °? Canned or highly processed food. °? Food that is high in saturated fat or sugar. °? Fried food. °· Limit alcohol intake to no more than 1 drink a day for non-pregnant women and 2 drinks a day for men. One drink equals 12 oz of beer, 5 oz of wine, or 1½ oz of hard liquor. °Lifestyle °· Do not use any products that contain nicotine or tobacco, such as cigarettes and e-cigarettes. If you need help quitting, ask your health care provider. °· Take steps to manage and control your weight. °· Get regular exercise. °· Manage your blood pressure. °· Manage other health problems, such as diabetes. °General instructions °· Take over-the-counter and prescription medicines only as told by your health care provider. Blood thinners may be prescribed after your procedure to improve blood flow through the stent. °· If you need an MRI after your heart stent has been placed, be   sure to tell the health care provider who orders the MRI that you have a heart stent. °· Keep all follow-up visits as directed by your health care provider. This is important. °Contact a health care provider if: °· You have a fever. °· You have chills. °· You have increased bleeding from the catheter insertion area. Hold pressure on the area. °Get help right away if: °· You develop chest pain or shortness of breath. °· You feel faint or you pass out. °· You have unusual pain at the catheter insertion area. °· You have redness,  warmth, or swelling at the catheter insertion area. °· You have drainage (other than a small amount of blood on the dressing) from the catheter insertion area. °· The catheter insertion area is bleeding, and the bleeding does not stop after 30 minutes of holding steady pressure on the area. °· You develop bleeding from any other place, such as from your rectum. There may be bright red blood in your urine or stool, or it may appear as black, tarry stool. °This information is not intended to replace advice given to you by your health care provider. Make sure you discuss any questions you have with your health care provider. °Document Released: 02/17/2005 Document Revised: 04/27/2016 Document Reviewed: 04/27/2016 °Elsevier Interactive Patient Education © 2018 [employer] Inc. ° °

## 2017-12-08 NOTE — Discharge Summary (Addendum)
Discharge Summary    Patient ID: [name],  MRN: [id number], DOB/AGE: May 23, 1956 62 y.o.  Admit date: 12/07/2017 Discharge date: 12/08/2017  Primary Care Provider: [name] Primary Cardiologist: Dr Johnsie Cancel  Discharge Diagnoses    Principal Problem:   Unstable angina [facility]) Active Problems:   CAD S/P percutaneous coronary angioplasty   COPD mixed type [facility])   Carotid disease, bilateral ([place])   Chronic venous insufficiency   Morbid obesity ([place])   Chronic diastolic CHF (congestive heart failure) ([facility])   Hx of CABG   Allergies Allergies  Allergen Reactions  . Bendamustine Hcl Rash    See hospital notes from 01/12/15  . Heparin Other (See Comments)    Opposite reaction Heparin induced thrombocytopenia  . Tape Rash    Diagnostic Studies/Procedures    Cath/ PCI 12/07/17 _____________   History of Present Illness     62 y/o male admitted for diagnostic cath  Hospital Course     62 y.o. obese male with a H/O CABG in 2005, left CEA 2009, HTN, HLD, GERD, OSA and COPD,  follicular lymphoma diagnosed in 2016, and Melanoma on his back, PET/CT showed no recurrent cancer in March 2018. His last cath was 09/29/11 and showed a patent LIMA to LAD. He received a DES to the SVG- Ramus and a DES to the SVG-PDA.   The patient was recently admitted to the hospital with a COPD exacerbation. He was treated with IV steroids, inhalers, and oxygen which he was to be weaned from as outpatient. He had no cardiac issues during that stay with a BNP of only 150. Echo done during that admission showed an EF of 55-60% with normal diastolic parameters and no significant valve disease.  He was seen by Dr Johnsie Cancel in follow up 11/28/17 and complained of continued dyspnea. As well as some chest pain. He was set up for diagnostic Rt and Lt heart cath 12/07/17. This revealed a proximal SVG-RCA occlusion which was intervened on with a DES. Right heart cath revealed moderate pulmonary HTN. The pt  tolerated the procedure well and was seen by Dr Wynonia Lawman the morning of 12/08/17 and felt to be stable for discharge. The pt did have a run of AVNRT vs slow VT overnight. He has normal LVF and is on beta blocker, will continue this therapy.  _____________  Discharge Vitals Blood pressure 135/60, pulse (!) 54, temperature 98.2 F (36.8 C), temperature source Oral, resp. rate 14, height 6' (1.829 m), weight (!) 336 lb 4.8 oz (152.5 kg), SpO2 93 %.  Filed Weights   12/07/17 0654 12/08/17 0405  Weight: (!) 340 lb (154.2 kg) (!) 336 lb 4.8 oz (152.5 kg)    Labs & Radiologic Studies    CBC Recent Labs    12/08/17 0411  WBC 9.9  HGB 13.9  HCT 43.2  MCV 101.2*  PLT 973*   Basic Metabolic Panel Recent Labs    12/08/17 0411  NA 138  K 4.6  CL 100*  CO2 32  GLUCOSE 108*  BUN 12  CREATININE 0.77  CALCIUM 8.6*   Liver Function Tests No results for input(s): AST, ALT, ALKPHOS, BILITOT, PROT, ALBUMIN in the last 72 hours. No results for input(s): LIPASE, AMYLASE in the last 72 hours. Cardiac Enzymes No results for input(s): CKTOTAL, CKMB, CKMBINDEX, TROPONINI in the last 72 hours. BNP Invalid input(s): POCBNP D-Dimer No results for input(s): DDIMER in the last 72 hours. Hemoglobin A1C No results for input(s): HGBA1C in  the last 72 hours. Fasting Lipid Panel No results for input(s): CHOL, HDL, LDLCALC, TRIG, CHOLHDL, LDLDIRECT in the last 72 hours. Thyroid Function Tests No results for input(s): TSH, T4TOTAL, T3FREE, THYROIDAB in the last 72 hours.  Invalid input(s): FREET3 _____________  Dg Chest 2 View  Result Date: 11/15/2017 CLINICAL DATA:  Fever, cough, congestion, and shortness of breath for the past week. EXAM: CHEST - 2 VIEW COMPARISON:  Chest x-ray dated September 11, 2017. FINDINGS: Stable cardiomegaly status post CABG. Pulmonary vascular congestion and mild interstitial thickening is similar to prior study. Trace bilateral pleural effusions are unchanged. Patchy opacity  in the left lower lobe appears unchanged and likely reflects atelectasis or scarring. Unchanged calcified pleural plaques. No pneumothorax. No acute osseous abnormality. IMPRESSION: 1. Stable cardiomegaly and mild interstitial edema. 2. No consolidation. Electronically Signed   By: Titus Dubin M.D.   On: 11/15/2017 17:08   Disposition   Pt is being discharged home today in good condition.  Follow-up Plans & Appointments    Follow-up Information    Burtis Junes, NP Follow up on 01/01/2018.   Specialties:  Nurse Practitioner, Interventional Cardiology, Cardiology, Radiology Why:  9AM Contact information: [place]. [address] [place] [address] [phone]          Discharge Instructions    Amb Referral to Cardiac Rehabilitation   Complete by:  As directed    Diagnosis:  Coronary Stents      Discharge Medications   Allergies as of 12/08/2017      Reactions   Bendamustine Hcl Rash   See hospital notes from 01/12/15   Heparin Other (See Comments)   Opposite reaction Heparin induced thrombocytopenia   Tape Rash      Medication List    STOP taking these medications   predniSONE 20 MG tablet Commonly known as:  DELTASONE     TAKE these medications   acetaminophen 325 MG tablet Commonly known as:  TYLENOL Take 2 tablets (650 mg total) by mouth every 6 (six) hours as needed for mild pain or headache.   albuterol (2.5 MG/3ML) 0.083% nebulizer solution Commonly known as:  PROVENTIL Take 2.5 mg by nebulization daily as needed for wheezing or shortness of breath.   ANORO ELLIPTA 62.5-25 MCG/INH Aepb Generic drug:  umeclidinium-vilanterol Inhale 1 puff into the lungs daily as needed. For wheezing & SOB   aspirin EC 81 MG tablet Take 81 mg by mouth every evening.   clonazePAM 0.5 MG tablet Commonly known as:  KLONOPIN TAKE 1 TABLET TWICE A DAY AS NEEDED FOR ANXIETY OR AT BEDTIME FOR INSOMNIA What changed:    how much to take  how to take  this  when to take this  additional instructions   clopidogrel 75 MG tablet Commonly known as:  PLAVIX TAKE 1 TABLET DAILY WITH   BREAKFAST   KLOR-CON M20 20 MEQ tablet Generic drug:  potassium chloride SA TAKE 1 TABLET DAILY   metoprolol succinate 50 MG 24 hr tablet Commonly known as:  TOPROL-XL TAKE 2 TABLETS (100MG )     DAILY WITH OR IMMEDIATELY  FOLLOWING A MEAL What changed:  See the new instructions.   nitroGLYCERIN 0.4 MG SL tablet Commonly known as:  NITROSTAT Place 1 tablet (0.4 mg total) under the tongue every 5 (five) minutes as needed for chest pain (Up to 3 doses).   oxyCODONE-acetaminophen 5-325 MG tablet Commonly known as:  PERCOCET/ROXICET Take 1 tablet by mouth every 6 (six) hours as needed  for severe pain.   oxymetazoline 0.05 % nasal spray Commonly known as:  AFRIN Place 1 spray into both nostrils daily as needed for congestion.   simvastatin 20 MG tablet Commonly known as:  ZOCOR TAKE 1 TABLET AT BEDTIME   torsemide 100 MG tablet Commonly known as:  DEMADEX TAKE 1 TABLET DAILY         Outstanding Labs/Studies     Duration of Discharge Encounter   Greater than 30 minutes including physician time.  Signed, [address] [place] 12/08/2017, 11:08 AM   AGREE WITH ABOVE  Kerry Hough MD [facility]

## 2017-12-08 NOTE — Progress Notes (Signed)
Subjective:  Complains of back pain from lying in bed, no complaints of chest pain.  Had 29 beats of slow V. Tach or AI VR last night while sleeping.  Not much ectopy otherwise.  LV function was normal.  Objective:  Vital Signs in the last 24 hours: BP 135/60 (BP Location: Right Arm)   Pulse (!) 54   Temp 98.2 F (36.8 C) (Oral)   Resp 14   Ht 6' (1.829 m)   Wt (!) 152.5 kg (336 lb 4.8 oz)   SpO2 93%   BMI 45.61 kg/m   Physical Exam: Of early obese male in no acute distress Lungs:  Clear Cardiac:  Regular rhythm, normal S1 and S2, no S3 Abdomen:  Soft, nontender, no masses Extremities:  Femoral catheterization site with moderate ecchymoses  Intake/Output from previous day: 04/26 0701 - 04/27 0700 In: 958.8 [P.O.:360; I.V.:598.8] Out: -   Weight Filed Weights   12/07/17 0654 12/08/17 0405  Weight: (!) 154.2 kg (340 lb) (!) 152.5 kg (336 lb 4.8 oz)    Lab Results: Basic Metabolic Panel: Recent Labs    12/08/17 0411  NA 138  K 4.6  CL 100*  CO2 32  GLUCOSE 108*  BUN 12  CREATININE 0.77   CBC: Recent Labs    12/08/17 0411  WBC 9.9  HGB 13.9  HCT 43.2  MCV 101.2*  PLT 138*   Telemetry: Reviewed   Assessment/Plan:  1.  CAD with bypass graft disease with successful stenting of the graft to the PDA 2.  Morbid obesity 3.  Long run of AIVR/slow VT-I don't think we need to observe this further at this time.  Would be sure he is discharged on beta blockers as his LV function was normal.     W. Doristine Church  MD [facility]  12/08/2017, 8:24 AM

## 2017-12-08 NOTE — Progress Notes (Signed)
Patient ambulated 671ft with stand by assistance.  No c/o chest pain or shortness of breath

## 2017-12-08 NOTE — Progress Notes (Signed)
CARDIAC REHAB PHASE I    Pt politely declined ambulation, but willing to hear education. Pt informed he will need to ambulate with nurse before being discharged. Began education with pt. Reviewed stent card, PTCA/Stent booklet, antiplatelet medications, NTG use, restrictions, exercise guidelines, heart healthy/diabetic nutrition, continuing with smoking cessation and Cardiac Rehab. Pt was not very receptive to most of the education. Pt states he has already made some changes with his diet. Pt was not interested with exercise. Pt states he will think about cardiac rehab and talk more with his wife. Pt okay with sending referral to [place]. Pt encouraged to consider moving more and congratulated him on his recent weight loss and smoking cessations.   [phone]  Carma Lair MS, ACSM CEP  9:27 AM 12/08/2017

## 2017-12-10 MED FILL — Morphine Sulfate Inj 4 MG/ML: INTRAMUSCULAR | Qty: 0.5 | Status: AC

## 2017-12-12 ENCOUNTER — Telehealth: Payer: Self-pay | Admitting: Hematology and Oncology

## 2017-12-12 NOTE — Telephone Encounter (Signed)
Spoke to patient regarding VM he left earlier to r/s appointment he missed.

## 2017-12-14 ENCOUNTER — Ambulatory Visit: Payer: BLUE CROSS/BLUE SHIELD | Admitting: Internal Medicine

## 2017-12-14 ENCOUNTER — Encounter: Payer: Self-pay | Admitting: Internal Medicine

## 2017-12-14 DIAGNOSIS — Z720 Tobacco use: Secondary | ICD-10-CM | POA: Diagnosis not present

## 2017-12-14 DIAGNOSIS — M545 Low back pain, unspecified: Secondary | ICD-10-CM

## 2017-12-14 DIAGNOSIS — C8228 Follicular lymphoma grade III, unspecified, lymph nodes of multiple sites: Secondary | ICD-10-CM | POA: Diagnosis not present

## 2017-12-14 DIAGNOSIS — I10 Essential (primary) hypertension: Secondary | ICD-10-CM

## 2017-12-14 DIAGNOSIS — L97919 Non-pressure chronic ulcer of unspecified part of right lower leg with unspecified severity: Secondary | ICD-10-CM

## 2017-12-14 DIAGNOSIS — I87313 Chronic venous hypertension (idiopathic) with ulcer of bilateral lower extremity: Secondary | ICD-10-CM | POA: Diagnosis not present

## 2017-12-14 DIAGNOSIS — J449 Chronic obstructive pulmonary disease, unspecified: Secondary | ICD-10-CM | POA: Diagnosis not present

## 2017-12-14 DIAGNOSIS — L97929 Non-pressure chronic ulcer of unspecified part of left lower leg with unspecified severity: Secondary | ICD-10-CM

## 2017-12-14 DIAGNOSIS — I2510 Atherosclerotic heart disease of native coronary artery without angina pectoris: Secondary | ICD-10-CM | POA: Diagnosis not present

## 2017-12-14 DIAGNOSIS — M79605 Pain in left leg: Secondary | ICD-10-CM

## 2017-12-14 DIAGNOSIS — E1151 Type 2 diabetes mellitus with diabetic peripheral angiopathy without gangrene: Secondary | ICD-10-CM

## 2017-12-14 DIAGNOSIS — I5032 Chronic diastolic (congestive) heart failure: Secondary | ICD-10-CM

## 2017-12-14 DIAGNOSIS — I2583 Coronary atherosclerosis due to lipid rich plaque: Secondary | ICD-10-CM | POA: Diagnosis not present

## 2017-12-14 MED ORDER — OXYCODONE-ACETAMINOPHEN 5-325 MG PO TABS: 1.0000 | ORAL_TABLET | Freq: Four times a day (QID) | ORAL | 0 refills | Status: DC | PRN

## 2017-12-14 NOTE — Progress Notes (Signed)
Subjective:  Patient ID: [name], male    DOB: Mar 28, 1956  Age: 62 y.o. MRN: [id number]  CC: No chief complaint on file.   HPI [name] presents for LBP, CAD, COPD f/u  Outpatient Medications Prior to Visit  Medication Sig Dispense Refill  . acetaminophen (TYLENOL) 325 MG tablet Take 2 tablets (650 mg total) by mouth every 6 (six) hours as needed for mild pain or headache.    . albuterol (PROVENTIL) (2.5 MG/3ML) 0.083% nebulizer solution Take 2.5 mg by nebulization daily as needed for wheezing or shortness of breath.    [name] ELLIPTA 62.5-25 MCG/INH AEPB Inhale 1 puff into the lungs daily as needed. For wheezing & SOB 180 each 3  . aspirin EC 81 MG tablet Take 81 mg by mouth every evening.     . clonazePAM (KLONOPIN) 0.5 MG tablet TAKE 1 TABLET TWICE A DAY AS NEEDED FOR ANXIETY OR AT BEDTIME FOR INSOMNIA (Patient taking differently: Take 0.5 mg by mouth at bedtime. ) 180 tablet 1  . clopidogrel (PLAVIX) 75 MG tablet TAKE 1 TABLET DAILY WITH   BREAKFAST 90 tablet 3  . KLOR-CON M20 20 MEQ tablet TAKE 1 TABLET DAILY 90 tablet 1  . metoprolol succinate (TOPROL-XL) 50 MG 24 hr tablet TAKE 2 TABLETS (100MG )     DAILY WITH OR IMMEDIATELY  FOLLOWING A MEAL (Patient taking differently: TAKE 2 TABLETS (100MG )     DAILY WITH OR IMMEDIATELY  FOLLOWING A MEAL IN THE EVENING) 180 tablet 3  . oxyCODONE-acetaminophen (PERCOCET/ROXICET) 5-325 MG tablet Take 1 tablet by mouth every 6 (six) hours as needed for severe pain. 120 tablet 0  . oxymetazoline (AFRIN) 0.05 % nasal spray Place 1 spray into both nostrils daily as needed for congestion.    . simvastatin (ZOCOR) 20 MG tablet TAKE 1 TABLET AT BEDTIME 90 tablet 3  . torsemide (DEMADEX) 100 MG tablet TAKE 1 TABLET DAILY 90 tablet 3  . nitroGLYCERIN (NITROSTAT) 0.4 MG SL tablet Place 1 tablet (0.4 mg total) under the tongue every 5 (five) minutes as needed for chest pain (Up to 3 doses). 25 tablet 1   No facility-administered medications  prior to visit.     ROS Review of Systems  Constitutional: Positive for fatigue. Negative for appetite change and unexpected weight change.  HENT: Negative for congestion, nosebleeds, sneezing, sore throat and trouble swallowing.   Eyes: Negative for itching and visual disturbance.  Respiratory: Positive for shortness of breath. Negative for cough.   Cardiovascular: Positive for leg swelling. Negative for chest pain and palpitations.  Gastrointestinal: Negative for abdominal distention, blood in stool, diarrhea and nausea.  Genitourinary: Negative for frequency and hematuria.  Musculoskeletal: Positive for arthralgias, back pain and gait problem. Negative for joint swelling and neck pain.  Skin: Positive for color change. Negative for rash.  Neurological: Negative for dizziness, tremors, speech difficulty and weakness.  Psychiatric/Behavioral: Negative for agitation, dysphoric mood and sleep disturbance. The patient is not nervous/anxious.     Objective:  BP 124/66 (BP Location: Left Arm, Patient Position: Sitting, Cuff Size: Large)   Pulse 65   Temp 99.1 F (37.3 C) (Oral)   Ht 6' (1.829 m)   Wt (!) 339 lb (153.8 kg)   SpO2 95%   BMI 45.98 kg/m   BP Readings from Last 3 Encounters:  12/14/17 124/66  12/08/17 135/60  11/28/17 122/62    Wt Readings from Last 3 Encounters:  12/14/17 (!) 339 lb (153.8 kg)  12/08/17 (!) 336 lb 4.8 oz (152.5 kg)  11/28/17 (!) 326 lb (147.9 kg)    Physical Exam  Constitutional: He is oriented to person, place, and time. He appears well-developed. No distress.  NAD  HENT:  Mouth/Throat: Oropharynx is clear and moist.  Eyes: Pupils are equal, round, and reactive to light. Conjunctivae are normal.  Neck: Normal range of motion. No JVD present. No thyromegaly present.  Cardiovascular: Normal rate, regular rhythm, normal heart sounds and intact distal pulses. Exam reveals no gallop and no friction rub.  No murmur heard. Pulmonary/Chest: Effort  normal and breath sounds normal. No respiratory distress. He has no wheezes. He has no rales. He exhibits no tenderness.  Abdominal: Soft. Bowel sounds are normal. He exhibits no distension and no mass. There is no tenderness. There is no rebound and no guarding.  Musculoskeletal: Normal range of motion. He exhibits edema and tenderness.  Lymphadenopathy:    He has no cervical adenopathy.  Neurological: He is alert and oriented to person, place, and time. He has normal reflexes. No cranial nerve deficit. He exhibits normal muscle tone. He displays a negative Romberg sign. Coordination abnormal. Gait normal.  Skin: Skin is warm and dry. No rash noted.  Psychiatric: He has a normal mood and affect. His behavior is normal. Judgment and thought content normal.   Obese Legs w/swelling   Lab Results  Component Value Date   WBC 9.9 12/08/2017   HGB 13.9 12/08/2017   HCT 43.2 12/08/2017   PLT 138 (L) 12/08/2017   GLUCOSE 108 (H) 12/08/2017   CHOL 131 03/25/2014   TRIG 185.0 (H) 03/25/2014   HDL 24.00 (L) 03/25/2014   LDLCALC 70 03/25/2014   ALT 14 09/11/2017   AST 18 09/11/2017   NA 138 12/08/2017   K 4.6 12/08/2017   CL 100 (L) 12/08/2017   CREATININE 0.77 12/08/2017   BUN 12 12/08/2017   CO2 32 12/08/2017   TSH 1.60 09/11/2017   PSA 0.23 09/15/2011   INR 1.0 11/28/2017   HGBA1C 6.0 09/11/2017   MICROALBUR 0.7 03/29/2016    No results found.  Assessment & Plan:   There are no diagnoses linked to this encounter. I am having [name] maintain his aspirin EC, nitroGLYCERIN, metoprolol succinate, clonazePAM, oxyCODONE-acetaminophen, clopidogrel, simvastatin, KLOR-CON M20, albuterol, torsemide, oxymetazoline, ANORO ELLIPTA, and acetaminophen.  No orders of the defined types were placed in this encounter.    Follow-up: No follow-ups on file.  Scott Kehr, MD

## 2017-12-14 NOTE — Assessment & Plan Note (Signed)
BP Readings from Last 3 Encounters:  12/14/17 124/66  12/08/17 135/60  11/28/17 122/62

## 2017-12-14 NOTE — Assessment & Plan Note (Signed)
Torsemide, Hydralazine 

## 2017-12-14 NOTE — Assessment & Plan Note (Signed)
Labs

## 2017-12-14 NOTE — Assessment & Plan Note (Signed)
quit smoking after another STENT

## 2017-12-14 NOTE — Assessment & Plan Note (Signed)
Per Dr Alvy Bimler

## 2017-12-14 NOTE — Assessment & Plan Note (Signed)
Quit smoking 4/19

## 2017-12-14 NOTE — Assessment & Plan Note (Signed)
Percocet prn  Potential benefits of a long term opioids use as well as potential risks (i.e. addiction risk, apnea etc) and complications (i.e. Somnolence, constipation and others) were explained to the patient and were aknowledged.  

## 2017-12-14 NOTE — Assessment & Plan Note (Signed)
Sores are closed

## 2017-12-14 NOTE — Assessment & Plan Note (Signed)
Wt Readings from Last 3 Encounters:  12/14/17 (!) 339 lb (153.8 kg)  12/08/17 (!) 336 lb 4.8 oz (152.5 kg)  11/28/17 (!) 326 lb (147.9 kg)

## 2017-12-19 ENCOUNTER — Telehealth (HOSPITAL_COMMUNITY): Payer: Self-pay

## 2017-12-19 NOTE — Telephone Encounter (Signed)
Patients insurance is active and benefits verified through [place] - No co-pay, deductible amount of $450.00/$450.00 has been met, out of pocket amount of $3,000/$3,000 has been met, 15% co-insurance, and no pre-authorization is required. Passport/reference [id number]  Will contact patient to see if he is interested in the Cardiac Rehab program. If interested, patient will be contacted for scheduling upon review by the RN Navigator.

## 2017-12-19 NOTE — Telephone Encounter (Signed)
Called patient to see if he is interested in the Cardiac Rehab program. Patient stated he is not interested. Closed referral.

## 2017-12-21 DIAGNOSIS — J449 Chronic obstructive pulmonary disease, unspecified: Secondary | ICD-10-CM | POA: Diagnosis not present

## 2017-12-27 DIAGNOSIS — L720 Epidermal cyst: Secondary | ICD-10-CM | POA: Diagnosis not present

## 2017-12-28 ENCOUNTER — Ambulatory Visit (HOSPITAL_COMMUNITY)
Admission: RE | Admit: 2017-12-28 | Discharge: 2017-12-28 | Disposition: A | Discharge status: Home / Self Care | Payer: BLUE CROSS/BLUE SHIELD | Source: Ambulatory Visit | Attending: Cardiovascular Disease | Admitting: Cardiovascular Disease

## 2017-12-28 DIAGNOSIS — F17200 Nicotine dependence, unspecified, uncomplicated: Secondary | ICD-10-CM | POA: Diagnosis not present

## 2017-12-28 DIAGNOSIS — I6523 Occlusion and stenosis of bilateral carotid arteries: Secondary | ICD-10-CM | POA: Insufficient documentation

## 2017-12-28 DIAGNOSIS — I2510 Atherosclerotic heart disease of native coronary artery without angina pectoris: Secondary | ICD-10-CM | POA: Diagnosis not present

## 2017-12-28 DIAGNOSIS — I10 Essential (primary) hypertension: Secondary | ICD-10-CM | POA: Insufficient documentation

## 2017-12-28 DIAGNOSIS — E785 Hyperlipidemia, unspecified: Secondary | ICD-10-CM | POA: Diagnosis not present

## 2017-12-28 DIAGNOSIS — E119 Type 2 diabetes mellitus without complications: Secondary | ICD-10-CM | POA: Diagnosis not present

## 2017-12-31 ENCOUNTER — Telehealth: Payer: Self-pay

## 2017-12-31 DIAGNOSIS — I739 Peripheral vascular disease, unspecified: Principal | ICD-10-CM

## 2017-12-31 DIAGNOSIS — I779 Disorder of arteries and arterioles, unspecified: Secondary | ICD-10-CM

## 2017-12-31 NOTE — Telephone Encounter (Signed)
Patient aware of carotid results. Per Dr. Johnsie Cancel, plaque no stenosis f/u carotid duplex in 2 years. Patient verbalized understanding.

## 2017-12-31 NOTE — Telephone Encounter (Signed)
-----   Message from [name], MD sent at 12/28/2017  5:02 PM EDT ----- Plaque no stenosis f/u carotid duplex in 2 years

## 2017-12-31 NOTE — Progress Notes (Signed)
CARDIOLOGY OFFICE NOTE  Date:  01/01/2018    [name] Date of Birth: 04-13-1956 Medical Record #[id number]  PCP:  [name], MD  Cardiologist:  Johnsie Cancel    Chief Complaint  Patient presents with  . Coronary Artery Disease    Post hospital/cath visit - seen for Dr. Johnsie Cancel    History of Present Illness: [name] is a 62 y.o. male who presents today for a 1 month/post hospital check. Seen for Dr. Johnsie Cancel.   He has a history of obesity, known CAD with prior CABG in 2005, left CEA 2009, HTN, HLD, GERD, OSA, COPD and follicular lymphoma diagnosed in 2016.   Last cath 09/29/11 with patent LIMA to LAD. Received DES to SVG Ramus and DES to SVG PDA at that time.   Admitted earlier this spring with COPD exacerbation - no cardiac issues noted. Echo with normal EF at 55 to 10%, normal diastolic parameters and no significant valve disease. Saw Dr. Johnsie Cancel last month - fatigued, some dyspnea and right sided chest pain endorsed. Not felt to be a good candidate for non invasive testing - referred for cardiac cath - see below.   Comes in today. Here alone. He feels like he is doing well. No more chest pain. His breathing he says is now "ok". Has only had a banana today. Tolerating his medicines. No problems with his groin. He really has no concerns.    Past Medical History:  Diagnosis Date  . Anginal pain ([place])    not had to use in awhile  none in 10 years  . Anxiety   . Basal cell carcinoma of nose   . Blood dyscrasia    trouble clotting   . Carotid artery disease ([place])    s/p L CEA 2009 (hx of evacuation of hematoma due to heparin)  . CHF (congestive heart failure) ([place])   . Chronic lower back pain   . COPD (chronic obstructive pulmonary disease) ([place])   . Coronary artery disease    CABG 2005. s/p PTCA and stenting of the saphenous vein graft to PDA and saphenous vein graft to obtuse marginal by Dr. Burt Knack 09/29/11. Normal EF at cath 09/2011  . Depression   .  Diabetes mellitus without complication (HCC)    borderline   . Dysrhythmia    fluttering  . Edema   . GERD (gastroesophageal reflux disease)   . Heart murmur   . 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 History of home oxygen therapy    2 liters at night prn  . Hyperlipidemia   . Hypertension   . Lymphoma ([place]) 12/21/2014  . Myocardial infarction ([place])   . Obesity   . Shortness of breath dyspnea    with exertion   . Sleep apnea    "used to"      could not use 2006    Past Surgical History:  Procedure Laterality Date  . BASAL CELL CARCINOMA EXCISION  2000's   nose  . CARDIAC CATHETERIZATION  12/07/2017  . CAROTID ENDARTERECTOMY  03/2008   left  . CORONARY ANGIOPLASTY WITH STENT PLACEMENT  09/29/11   "2"  . CORONARY ARTERY BYPASS GRAFT  2005   CABG X3  . CORONARY STENT INTERVENTION  12/07/2017  . CORONARY STENT INTERVENTION N/A 12/07/2017   Procedure: CORONARY STENT INTERVENTION;  Surgeon: Burnell Blanks, MD;  Location: [facility];  Service: Cardiovascular;  Laterality: N/A;  . evacuation of hematoma  03/2008  left neck; S/P endarterectomy; "cause heparin clotted it up"  . I&D EXTREMITY Left 02/11/2015   Procedure: IRRIGATION AND DEBRIDEMENT LEFT LONG FINGER;  Surgeon: Leanora Cover, MD;  Location: [facility];  Service: Orthopedics;  Laterality: Left;  I and D Left Long Finger  . IR REMOVAL TUN ACCESS W/ PORT W/O FL MOD SED  05/18/2017  . MASS EXCISION Left 12/11/2014   Procedure: EXCISIONAL BIOPSY OF LEFT SUPRA CLAVICULAR NECK MASS;  Surgeon: Jerrell Belfast, MD;  Location: [facility];  Service: ENT;  Laterality: Left;  . PERCUTANEOUS CORONARY STENT INTERVENTION (PCI-S) N/A 09/29/2011   Procedure: PERCUTANEOUS CORONARY STENT INTERVENTION (PCI-S);  Surgeon: Sherren Mocha, MD;  Location: [facility];  Service: Cardiovascular;  Laterality: N/A;  . PORTACATH PLACEMENT  2016  . RIGHT/LEFT HEART CATH AND CORONARY/GRAFT ANGIOGRAPHY N/A 12/07/2017   Procedure:  RIGHT/LEFT HEART CATH AND CORONARY/GRAFT ANGIOGRAPHY;  Surgeon: Burnell Blanks, MD;  Location: [facility];  Service: Cardiovascular;  Laterality: N/A;  . SUPERFICIAL LYMPH NODE BIOPSY / EXCISION Left      Medications: Current Meds  Medication Sig  . acetaminophen (TYLENOL) 325 MG tablet Take 2 tablets (650 mg total) by mouth every 6 (six) hours as needed for mild pain or headache.  . albuterol (PROVENTIL) (2.5 MG/3ML) 0.083% nebulizer solution Take 2.5 mg by nebulization daily as needed for wheezing or shortness of breath.  [name] ELLIPTA 62.5-25 MCG/INH AEPB Inhale 1 puff into the lungs daily as needed. For wheezing & SOB  . aspirin EC 81 MG tablet Take 81 mg by mouth every evening.   . clonazePAM (KLONOPIN) 0.5 MG tablet TAKE 1 TABLET TWICE A DAY AS NEEDED FOR ANXIETY OR AT BEDTIME FOR INSOMNIA (Patient taking differently: Take 0.5 mg by mouth at bedtime. )  . clopidogrel (PLAVIX) 75 MG tablet TAKE 1 TABLET DAILY WITH   BREAKFAST  . KLOR-CON M20 20 MEQ tablet TAKE 1 TABLET DAILY  . metoprolol succinate (TOPROL-XL) 50 MG 24 hr tablet TAKE 2 TABLETS (100MG )     DAILY WITH OR IMMEDIATELY  FOLLOWING A MEAL (Patient taking differently: TAKE 2 TABLETS (100MG )     DAILY WITH OR IMMEDIATELY  FOLLOWING A MEAL IN THE EVENING)  . oxyCODONE-acetaminophen (PERCOCET/ROXICET) 5-325 MG tablet Take 1 tablet by mouth every 6 (six) hours as needed for severe pain.  Marland Kitchen oxymetazoline (AFRIN) 0.05 % nasal spray Place 1 spray into both nostrils daily as needed for congestion.  . simvastatin (ZOCOR) 20 MG tablet TAKE 1 TABLET AT BEDTIME  . torsemide (DEMADEX) 100 MG tablet TAKE 1 TABLET DAILY     Allergies: Allergies  Allergen Reactions  . Bendamustine Hcl Rash    See hospital notes from 01/12/15  . Heparin Other (See Comments)    Opposite reaction Heparin induced thrombocytopenia  . Tape Rash    Social History: The patient  reports that he quit smoking about 5 weeks ago. His smoking use  included cigarettes. He has a 82.00 pack-year smoking history. He has quit using smokeless tobacco. His smokeless tobacco use included chew. He reports that he has current or past drug history. Drug: Marijuana. He reports that he does not drink alcohol.   Family History: The patient's family history includes Bone cancer in his mother; Heart disease in his father.   Review of Systems: Please see the history of present illness.   Otherwise, the review of systems is positive for none.   All other systems are reviewed and negative.  Physical Exam: VS:  BP (!) 116/58 (BP Location: Left Arm, Patient Position: Sitting, Cuff Size: Large)   Pulse 65   Ht 6' (1.829 m)   Wt (!) 336 lb 6.4 oz (152.6 kg)   SpO2 90% Comment: at rest  BMI 45.62 kg/m  .  BMI Body mass index is 45.62 kg/m.  Wt Readings from Last 3 Encounters:  01/01/18 (!) 336 lb 6.4 oz (152.6 kg)  12/14/17 (!) 339 lb (153.8 kg)  12/08/17 (!) 336 lb 4.8 oz (152.5 kg)    General: Pleasant. Obese. Alert in no acute distress.   HEENT: Normal.  Neck: Supple, no JVD, carotid bruits, or masses noted.  Cardiac: Regular rate and rhythm. Soft outflow murmur. Legs are full but no significant swelling.   Respiratory:  Lungs are clear to auscultation bilaterally with normal work of breathing.  GI: Soft and nontender.  MS: No deformity or atrophy. Gait and ROM intact.  Skin: Warm and dry. Color is normal.  Neuro:  Strength and sensation are intact and no gross focal deficits noted.  Psych: Alert, appropriate and with normal affect.   LABORATORY DATA:  EKG:  EKG is not ordered today.  Lab Results  Component Value Date   WBC 9.9 12/08/2017   HGB 13.9 12/08/2017   HCT 43.2 12/08/2017   PLT 138 (L) 12/08/2017   GLUCOSE 108 (H) 12/08/2017   CHOL 131 03/25/2014   TRIG 185.0 (H) 03/25/2014   HDL 24.00 (L) 03/25/2014   LDLCALC 70 03/25/2014   ALT 14 09/11/2017   AST 18 09/11/2017   NA 138 12/08/2017   K 4.6 12/08/2017   CL 100 (L)  12/08/2017   CREATININE 0.77 12/08/2017   BUN 12 12/08/2017   CO2 32 12/08/2017   TSH 1.60 09/11/2017   PSA 0.23 09/15/2011   INR 1.0 11/28/2017   HGBA1C 6.0 09/11/2017   MICROALBUR 0.7 03/29/2016     BNP (last 3 results) Recent Labs    11/15/17 1752  BNP 150.6*    ProBNP (last 3 results) No results for input(s): PROBNP in the last 8760 hours.   Other Studies Reviewed Today:  Procedures   CORONARY STENT INTERVENTION 12/07/2017  RIGHT/LEFT HEART CATH AND CORONARY/GRAFT ANGIOGRAPHY  Conclusion     Prox RCA lesion is 100% stenosed.  SVG graft was visualized by angiography and is normal in caliber.  Prox Graft lesion is 99% stenosed.  A drug-eluting stent was successfully placed using a STENT RESOLUTE ONYX 4.0X22.  Post intervention, there is a 0% residual stenosis.  Mid LM to Ost LAD lesion is 50% stenosed.  Ost 1st Mrg lesion is 100% stenosed.  Origin to Prox Graft lesion is 100% stenosed.  Ost LAD to Prox LAD lesion is 90% stenosed.  Hemodynamic findings consistent with moderate pulmonary hypertension.   1. Severe triple vessel CAD s/p 3V CABG with 2/3 patent bypass grafts 2. The left main has a distal 50% stenosis.  3. The LAD has severe proximal stenosis. The mid and distal LAD fills from the patent LIMA graft.  4. The Circumflex has mild diffuse disease. The intermediate is a large caliber vessel with proximal occlusion. The vein graft to the intermediate branch is occluded in the proximal stent.  5. The RCA is occluded in the proximal segment. The distal vessel fills from the patent vein graft. The stent in the proximal segment of the vein graft has severe in stent restenosis.  6. Elevated filling pressures  Recommendations: Will continue DAPT with ASA  and Plavix. Continue beta blocker and statin. He would benefit from additional diuresis.     Echo Study Conclusions April 2019  - Left ventricle: The cavity size was mildly dilated. There was    moderate concentric hypertrophy. Systolic function was normal.   The estimated ejection fraction was in the range of 55% to 60%.   Wall motion was normal; there were no regional wall motion   abnormalities. Left ventricular diastolic function parameters   were normal. - Aortic valve: There was trivial regurgitation. - Aorta: Aortic root dimension: 40 mm (ED). - Aortic root: The aortic root was mildly dilated. - Left atrium: The atrium was moderately dilated. - Right ventricle: The cavity size was mildly dilated. Wall   thickness was normal. Systolic function was mildly reduced.    Result Notes for VAS US CAROTID   Notes recorded by Michaelyn Barter, RN on 12/31/2017 at 2:54 PM EDT Patient aware of carotid results. Per Dr. Johnsie Cancel, plaque no stenosis f/u carotid duplex in 2 years. Patient verbalized understanding. Will send copy to patient's PCP. ------      Assessment/Plan:  1. CAD with remote CABG in 2005, stent to SVG to IM and SVG to PDA in 2013 - now s/p PCI with DES to SVG to RCA - remains on DAPT. Other cath findings noted. He is doing well clinically. Symptoms have resolved. CV risk factor modification encouraged.   2. HTN - BP ok on his current regimen.   3. HLD - on statin - getting lab today.   4. Lymphoma - says he has finished chemo and is remission.   5. Carotid stenosis - recent study noted.   6. Murmur - recent echo with no significant valve disease  7. COPD with admission last month for exacerbation. This has improved.   8. Obesity  Current medicines are reviewed with the patient today.  The patient does not have concerns regarding medicines other than what has been noted above.  The following changes have been made:  See above.  Labs/ tests ordered today include:    Orders Placed This Encounter  Procedures  . Basic metabolic panel  . CBC  . Hepatic function panel  . Lipid panel     Disposition:   FU with Dr. Johnsie Cancel as planned in  September.   Patient is agreeable to this plan and will call if any problems develop in the interim.   SignedTruitt Merle, NP  01/01/2018 9:24 AM  [facility] [address] [place], [place]  [address] Phone: [phone] Fax: [phone]

## 2018-01-01 ENCOUNTER — Encounter: Payer: Self-pay | Admitting: Nurse Practitioner

## 2018-01-01 ENCOUNTER — Ambulatory Visit (INDEPENDENT_AMBULATORY_CARE_PROVIDER_SITE_OTHER): Payer: BLUE CROSS/BLUE SHIELD | Admitting: Nurse Practitioner

## 2018-01-01 VITALS — BP 116/58 | HR 65 | Ht 72.0 in | Wt 336.4 lb

## 2018-01-01 DIAGNOSIS — E7849 Other hyperlipidemia: Secondary | ICD-10-CM

## 2018-01-01 DIAGNOSIS — I10 Essential (primary) hypertension: Secondary | ICD-10-CM | POA: Diagnosis not present

## 2018-01-01 DIAGNOSIS — Z955 Presence of coronary angioplasty implant and graft: Secondary | ICD-10-CM

## 2018-01-01 DIAGNOSIS — I259 Chronic ischemic heart disease, unspecified: Secondary | ICD-10-CM | POA: Diagnosis not present

## 2018-01-01 NOTE — Patient Instructions (Addendum)
We will be checking the following labs today - BMET, LFTs, lipids and CBC   Medication Instructions:    Continue with your current medicines.     Testing/Procedures To Be Arranged:  N/A  Follow-Up:   See Dr. Johnsie Cancel in 3 to 4 months    Other Special Instructions:   N/A    If you need a refill on your cardiac medications before your next appointment, please call your pharmacy.   Call the [place] office at [phone] if you have any questions, problems or concerns.

## 2018-01-02 ENCOUNTER — Institutional Professional Consult (permissible substitution): Payer: BLUE CROSS/BLUE SHIELD | Admitting: Internal Medicine

## 2018-01-02 LAB — BASIC METABOLIC PANEL
BUN/Creatinine Ratio: 17 (ref 10–24)
BUN: 14 mg/dL (ref 8–27)
CO2: 30 mmol/L — ABNORMAL HIGH (ref 20–29)
Calcium: 9.4 mg/dL (ref 8.6–10.2)
Chloride: 97 mmol/L (ref 96–106)
Creatinine, Ser: 0.84 mg/dL (ref 0.76–1.27)
GFR calc Af Amer: 108 mL/min/{1.73_m2} (ref >59)
GFR calc non Af Amer: 93 mL/min/{1.73_m2} (ref >59)
Glucose: 103 mg/dL — ABNORMAL HIGH (ref 65–99)
Potassium: 4.6 mmol/L (ref 3.5–5.2)
Sodium: 141 mmol/L (ref 134–144)

## 2018-01-02 LAB — HEPATIC FUNCTION PANEL
ALT: 20 IU/L (ref 0–44)
AST: 23 IU/L (ref 0–40)
Albumin: 4.6 g/dL (ref 3.6–4.8)
Alkaline Phosphatase: 76 IU/L (ref 39–117)
Bilirubin Total: 0.5 mg/dL (ref 0.0–1.2)
Bilirubin, Direct: 0.17 mg/dL (ref 0.00–0.40)
Total Protein: 7.1 g/dL (ref 6.0–8.5)

## 2018-01-02 LAB — LIPID PANEL
Chol/HDL Ratio: 3.1 ratio (ref 0.0–5.0)
Cholesterol, Total: 108 mg/dL (ref 100–199)
HDL: 35 mg/dL — ABNORMAL LOW (ref >39)
LDL Calculated: 53 mg/dL (ref 0–99)
Triglycerides: 98 mg/dL (ref 0–149)
VLDL Cholesterol Cal: 20 mg/dL (ref 5–40)

## 2018-01-02 LAB — CBC
Hematocrit: 43.3 % (ref 37.5–51.0)
Hemoglobin: 14.2 g/dL (ref 13.0–17.7)
MCH: 32.3 pg (ref 26.6–33.0)
MCHC: 32.8 g/dL (ref 31.5–35.7)
MCV: 98 fL — ABNORMAL HIGH (ref 79–97)
Platelets: 137 10*3/uL — ABNORMAL LOW (ref 150–450)
RBC: 4.4 x10E6/uL (ref 4.14–5.80)
RDW: 13.8 % (ref 12.3–15.4)
WBC: 8.5 10*3/uL (ref 3.4–10.8)

## 2018-01-10 ENCOUNTER — Institutional Professional Consult (permissible substitution): Payer: BLUE CROSS/BLUE SHIELD | Admitting: Internal Medicine

## 2018-01-11 ENCOUNTER — Inpatient Hospital Stay: Payer: BLUE CROSS/BLUE SHIELD | Attending: Hematology and Oncology

## 2018-01-11 ENCOUNTER — Encounter: Payer: Self-pay | Admitting: Hematology and Oncology

## 2018-01-11 ENCOUNTER — Inpatient Hospital Stay: Payer: BLUE CROSS/BLUE SHIELD | Admitting: Hematology and Oncology

## 2018-01-11 ENCOUNTER — Telehealth: Payer: Self-pay | Admitting: Hematology and Oncology

## 2018-01-11 DIAGNOSIS — D696 Thrombocytopenia, unspecified: Secondary | ICD-10-CM | POA: Diagnosis not present

## 2018-01-11 DIAGNOSIS — J441 Chronic obstructive pulmonary disease with (acute) exacerbation: Secondary | ICD-10-CM | POA: Insufficient documentation

## 2018-01-11 DIAGNOSIS — I5032 Chronic diastolic (congestive) heart failure: Secondary | ICD-10-CM

## 2018-01-11 DIAGNOSIS — Z87891 Personal history of nicotine dependence: Secondary | ICD-10-CM

## 2018-01-11 DIAGNOSIS — J449 Chronic obstructive pulmonary disease, unspecified: Secondary | ICD-10-CM

## 2018-01-11 DIAGNOSIS — C8228 Follicular lymphoma grade III, unspecified, lymph nodes of multiple sites: Secondary | ICD-10-CM

## 2018-01-11 LAB — COMPREHENSIVE METABOLIC PANEL
ALBUMIN: 4.5 g/dL (ref 3.5–5.0)
ALT: 22 U/L (ref 0–55)
AST: 19 U/L (ref 5–34)
Alkaline Phosphatase: 71 U/L (ref 40–150)
Anion gap: 7 (ref 3–11)
BUN: 18 mg/dL (ref 7–26)
CHLORIDE: 99 mmol/L (ref 98–109)
CO2: 35 mmol/L — ABNORMAL HIGH (ref 22–29)
Calcium: 9.6 mg/dL (ref 8.4–10.4)
Creatinine, Ser: 1.03 mg/dL (ref 0.70–1.30)
GFR calc Af Amer: >60 mL/min (ref >60)
Glucose, Bld: 93 mg/dL (ref 70–140)
POTASSIUM: 4.4 mmol/L (ref 3.5–5.1)
SODIUM: 141 mmol/L (ref 136–145)
Total Bilirubin: 0.7 mg/dL (ref 0.2–1.2)
Total Protein: 7.4 g/dL (ref 6.4–8.3)

## 2018-01-11 LAB — CBC WITH DIFFERENTIAL/PLATELET
BASOS ABS: 0.1 10*3/uL (ref 0.0–0.1)
BASOS PCT: 1 %
EOS PCT: 4 %
Eosinophils Absolute: 0.3 10*3/uL (ref 0.0–0.5)
HCT: 41.9 % (ref 38.4–49.9)
Hemoglobin: 14 g/dL (ref 13.0–17.1)
Lymphocytes Relative: 9 %
Lymphs Abs: 0.8 10*3/uL — ABNORMAL LOW (ref 0.9–3.3)
MCH: 32.1 pg (ref 27.2–33.4)
MCHC: 33.3 g/dL (ref 32.0–36.0)
MCV: 96.2 fL (ref 79.3–98.0)
Monocytes Absolute: 0.9 10*3/uL (ref 0.1–0.9)
Monocytes Relative: 10 %
Neutro Abs: 7.1 10*3/uL — ABNORMAL HIGH (ref 1.5–6.5)
Neutrophils Relative %: 76 %
PLATELETS: 134 10*3/uL — AB (ref 140–400)
RBC: 4.35 MIL/uL (ref 4.20–5.82)
RDW: 14.1 % (ref 11.0–14.6)
WBC: 9.3 10*3/uL (ref 4.0–10.3)

## 2018-01-11 LAB — LACTATE DEHYDROGENASE: LDH: 174 U/L (ref 125–245)

## 2018-01-11 NOTE — Assessment & Plan Note (Signed)
The patient has history of recurrent bronchitis/COPD exacerbation Clinically, he has no signs or symptoms of exacerbation today He has finally quit smoking in April I congratulated his efforts

## 2018-01-11 NOTE — Assessment & Plan Note (Signed)
Last CT scan showed no signs of disease. Clinically, he has no signs or symptoms to suggest cancer recurrence I will see him back in 6 months with repeat history, physical examination and blood work I do not recommend routine surveillance imaging study 

## 2018-01-11 NOTE — Assessment & Plan Note (Signed)
The patient has finally quit smoking He had recent cardiac catheterization and stent placement He will continue close follow-up with cardiologist for medical management

## 2018-01-11 NOTE — Assessment & Plan Note (Signed)
This is likely due to fatty liver disease. The patient denies recent history of bleeding such as epistaxis, hematuria or hematochezia. He is asymptomatic from the low platelet count. I will observe for now.

## 2018-01-11 NOTE — Progress Notes (Signed)
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 Clinically, he has no signs or symptoms to suggest cancer recurrence I will see him back in 6 months with repeat history, physical examination and blood work I do not recommend routine surveillance imaging study  Thrombocytopenia ([place]) This is likely due to fatty liver disease. The patient denies recent history of bleeding such as epistaxis, hematuria or hematochezia. He is asymptomatic from the low platelet count. I will observe for now.  Chronic diastolic CHF (congestive heart failure) (HCC) The patient has finally quit smoking He had recent cardiac catheterization and stent placement He will continue close follow-up with cardiologist for medical management  COPD mixed type [facility]) The patient has history of recurrent bronchitis/COPD exacerbation Clinically, he has no signs or symptoms of exacerbation today He has finally quit smoking in April I congratulated his efforts   No orders of the defined types were placed in this encounter.   INTERVAL HISTORY: Please see below for problem oriented charting. He returns for further follow-up He had recent hospitalization for recurrent COPD exacerbation He also had recent cardiac evaluation including cardiac catheterization He denies further chest pain or shortness of breath No new lymphadenopathy He is appetite is stable He bruises easily due to dual  antiplatelet agent The patient denies any recent signs or symptoms of bleeding such as spontaneous epistaxis, hematuria or hematochezia. The patient has quit smoking in April  SUMMARY OF ONCOLOGIC HISTORY: Oncology History   FLIPI score of 2; stage 3 and areas of involvement >4     Follicular lymphoma ([place])   06/24/2009 Imaging    PET CT scan showed two small FDG positive left neck nodes      12/11/2014 Surgery    He underwent excisional lymph node biopsy of the left supraclavicular lymph node/neck region      12/11/2014 Pathology Results    Accession: [id number] biopsy show follicular lymphoma      12/24/2014 Imaging    ECHO showed LVH but preserved EF      12/25/2014 Imaging    PET scan showed disease above and below diaphragm      12/28/2014 Procedure    He has port placement      12/31/2014 - 01/01/2015 Chemotherapy    He received 1 cycle of bendamustine with rituximab, discontinued due to suspicion of allergic reaction to bendamustine      01/12/2015 - 01/19/2015 Hospital Admission    He was admitted to the hospital with suspicious allergic reaction, significant bilateral lower extremity edema with cellulitis, pneumonia and mild fluid overload      03/18/2015 - 04/08/2015 Chemotherapy    Treatment is switched to rituximab, Cytoxan, vincristine and prednisone      04/29/2015 Imaging     PET CT scan showed near complete response to treatment.      04/30/2015 - 01/12/2017 Chemotherapy    He received maintenance Rituxan      11/03/2016 PET scan    No metabolic evidence of recurrent lymphoma. Mild patchy marrow hypermetabolism throughout the axial skeleton, unchanged,  probably due to a mildly reactive marrow state. 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      04/19/2017 Imaging    1. No evidence for lymphadenopathy in the chest, abdomen, or pelvis. 2. Aortic Atherosclerois (ICD10-170.0) 3. 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 4. Bilateral nonobstructing nephrolithiasis. 5. Cholelithiasis. 6. Hepatic steatosis      05/18/2017 Procedure    Successful removal of implanted Port-A-Cath       REVIEW OF SYSTEMS:   Constitutional: Denies fevers, chills or abnormal weight loss Eyes: Denies blurriness of vision Ears, nose, mouth, throat, and face: Denies mucositis or sore throat Cardiovascular: Denies palpitation, chest discomfort  Gastrointestinal:  Denies nausea, heartburn or change in bowel habits Skin: Denies abnormal skin rashes Lymphatics: Denies new lymphadenopathy  Neurological:Denies numbness, tingling or new weaknesses Behavioral/Psych: Mood is stable, no new changes  All other systems were reviewed with the patient and are negative.  I have reviewed the past medical history, past surgical history, social history and family history with the patient and they are unchanged from previous note.  ALLERGIES:  is allergic to bendamustine hcl; heparin; and tape.  MEDICATIONS:  Current Outpatient Medications  Medication Sig Dispense Refill  . acetaminophen (TYLENOL) 325 MG tablet Take 2 tablets (650 mg total) by mouth every 6 (six) hours as needed for mild pain or headache.    . albuterol (PROVENTIL) (2.5 MG/3ML) 0.083% nebulizer solution Take 2.5 mg by nebulization daily as needed for wheezing or shortness of breath.    [name] ELLIPTA 62.5-25 MCG/INH AEPB Inhale 1 puff into the lungs daily as needed. For wheezing & SOB 180 each 3  . aspirin EC 81 MG tablet Take 81 mg by mouth every evening.     . clonazePAM (KLONOPIN) 0.5 MG tablet TAKE 1 TABLET TWICE A DAY AS NEEDED FOR ANXIETY OR AT  BEDTIME FOR INSOMNIA (Patient taking differently: Take 0.5 mg by mouth at bedtime. ) 180 tablet 1  . clopidogrel (PLAVIX) 75 MG tablet TAKE 1 TABLET DAILY WITH   BREAKFAST 90 tablet 3  . KLOR-CON M20 20 MEQ tablet TAKE 1 TABLET DAILY 90 tablet 1  . metoprolol succinate (TOPROL-XL) 50 MG 24 hr tablet TAKE 2 TABLETS (100MG )     DAILY WITH OR IMMEDIATELY  FOLLOWING A MEAL (Patient taking differently: TAKE 2 TABLETS (100MG )     DAILY WITH OR IMMEDIATELY  FOLLOWING A MEAL IN THE EVENING) 180 tablet 3  . nitroGLYCERIN (NITROSTAT) 0.4 MG SL tablet Place 1 tablet (0.4 mg total) under the tongue every 5 (five) minutes as needed for chest pain (Up to 3 doses). 25 tablet 1  . oxyCODONE-acetaminophen (PERCOCET/ROXICET) 5-325 MG tablet Take 1 tablet by mouth every 6 (six) hours as needed for severe pain. 120 tablet 0  . oxymetazoline (AFRIN) 0.05 % nasal spray Place 1 spray into both nostrils daily as needed for congestion.    . simvastatin (ZOCOR) 20 MG tablet TAKE 1 TABLET AT BEDTIME 90 tablet 3  . torsemide (DEMADEX) 100 MG tablet TAKE 1 TABLET DAILY 90 tablet 3   No current facility-administered medications for this visit.     PHYSICAL EXAMINATION: ECOG PERFORMANCE STATUS: 1 - Symptomatic but completely ambulatory  Vitals:   01/11/18 1437  BP: (!) 144/70  Pulse: 72  Resp:  18  Temp: 99.4 F (37.4 C)  SpO2: 90%   Filed Weights   01/11/18 1437  Weight: (!) 332 lb 8 oz (150.8 kg)    GENERAL:alert, no distress and comfortable SKIN: Noted skin bruises EYES: normal, Conjunctiva are pink and non-injected, sclera clear OROPHARYNX:no exudate, no erythema and lips, buccal mucosa, and tongue normal  NECK: supple, thyroid normal size, non-tender, without nodularity LYMPH:  no palpable lymphadenopathy in the cervical, axillary or inguinal LUNGS: clear to auscultation and percussion with normal breathing effort HEART: regular rate & rhythm and no murmurs with chronic bilateral lower extremity  edema ABDOMEN:abdomen soft, non-tender and normal bowel sounds Musculoskeletal:no cyanosis of digits and no clubbing  NEURO: alert & oriented x 3 with fluent speech, no focal motor/sensory deficits  LABORATORY DATA:  I have reviewed the data as listed    Component Value Date/Time   NA 141 01/11/2018 1421   NA 141 01/01/2018 0932   NA 141 04/19/2017 0822   K 4.4 01/11/2018 1421   K 4.1 04/19/2017 0822   CL 99 01/11/2018 1421   CO2 35 (H) 01/11/2018 1421   CO2 33 (H) 04/19/2017 0822   GLUCOSE 93 01/11/2018 1421   GLUCOSE 113 04/19/2017 0822   BUN 18 01/11/2018 1421   BUN 14 01/01/2018 0932   BUN 8.7 04/19/2017 0822   CREATININE 1.03 01/11/2018 1421   CREATININE 0.7 04/19/2017 0822   CALCIUM 9.6 01/11/2018 1421   CALCIUM 9.5 04/19/2017 0822   PROT 7.4 01/11/2018 1421   PROT 7.1 01/01/2018 0932   PROT 6.8 04/19/2017 0822   ALBUMIN 4.5 01/11/2018 1421   ALBUMIN 4.6 01/01/2018 0932   ALBUMIN 3.8 04/19/2017 0822   AST 19 01/11/2018 1421   AST 18 04/19/2017 0822   ALT 22 01/11/2018 1421   ALT 21 04/19/2017 0822   ALKPHOS 71 01/11/2018 1421   ALKPHOS 82 04/19/2017 0822   BILITOT 0.7 01/11/2018 1421   BILITOT 0.5 01/01/2018 0932   BILITOT 0.53 04/19/2017 0822   GFRNONAA >60 01/11/2018 1421   GFRAA >60 01/11/2018 1421    No results found for: SPEP, UPEP  Lab Results  Component Value Date   WBC 9.3 01/11/2018   NEUTROABS 7.1 (H) 01/11/2018   HGB 14.0 01/11/2018   HCT 41.9 01/11/2018   MCV 96.2 01/11/2018   PLT 134 (L) 01/11/2018      Chemistry      Component Value Date/Time   NA 141 01/11/2018 1421   NA 141 01/01/2018 0932   NA 141 04/19/2017 0822   K 4.4 01/11/2018 1421   K 4.1 04/19/2017 0822   CL 99 01/11/2018 1421   CO2 35 (H) 01/11/2018 1421   CO2 33 (H) 04/19/2017 0822   BUN 18 01/11/2018 1421   BUN 14 01/01/2018 0932   BUN 8.7 04/19/2017 0822   CREATININE 1.03 01/11/2018 1421   CREATININE 0.7 04/19/2017 0822      Component Value Date/Time    CALCIUM 9.6 01/11/2018 1421   CALCIUM 9.5 04/19/2017 0822   ALKPHOS 71 01/11/2018 1421   ALKPHOS 82 04/19/2017 0822   AST 19 01/11/2018 1421   AST 18 04/19/2017 0822   ALT 22 01/11/2018 1421   ALT 21 04/19/2017 0822   BILITOT 0.7 01/11/2018 1421   BILITOT 0.5 01/01/2018 0932   BILITOT 0.53 04/19/2017 0822       All questions were answered. The patient knows to call the clinic with any problems, questions or concerns. No barriers to learning was  detected.  I spent 15 minutes counseling the patient face to face. The total time spent in the appointment was 20 minutes and more than 50% was on counseling and review of test results  Heath Lark, MD 01/11/2018 3:22 PM

## 2018-01-11 NOTE — Telephone Encounter (Signed)
Gave patient AVs and calendar of upcoming November appointments.  °

## 2018-01-21 DIAGNOSIS — J449 Chronic obstructive pulmonary disease, unspecified: Secondary | ICD-10-CM | POA: Diagnosis not present

## 2018-02-08 ENCOUNTER — Encounter: Payer: Self-pay | Admitting: Internal Medicine

## 2018-02-08 ENCOUNTER — Ambulatory Visit: Payer: BLUE CROSS/BLUE SHIELD | Admitting: Internal Medicine

## 2018-02-08 ENCOUNTER — Other Ambulatory Visit: Payer: BLUE CROSS/BLUE SHIELD

## 2018-02-08 VITALS — BP 140/80 | HR 73 | Ht 72.0 in | Wt 333.2 lb

## 2018-02-08 DIAGNOSIS — Z87891 Personal history of nicotine dependence: Secondary | ICD-10-CM

## 2018-02-08 DIAGNOSIS — R0609 Other forms of dyspnea: Secondary | ICD-10-CM

## 2018-02-08 DIAGNOSIS — J439 Emphysema, unspecified: Secondary | ICD-10-CM | POA: Diagnosis not present

## 2018-02-08 DIAGNOSIS — J449 Chronic obstructive pulmonary disease, unspecified: Secondary | ICD-10-CM | POA: Diagnosis not present

## 2018-02-08 DIAGNOSIS — R0600 Dyspnea, unspecified: Secondary | ICD-10-CM

## 2018-02-08 DIAGNOSIS — J929 Pleural plaque without asbestos: Secondary | ICD-10-CM | POA: Diagnosis not present

## 2018-02-08 NOTE — Progress Notes (Signed)
Subjective:     Patient ID: [name], male   DOB: 1955/12/01, 62 y.o.   MRN: [id number]  PCP Plotnikov, [name], MD   HPI  morbidly obese male with CAD and OSA (non compliant with CPAP). Left sided NECk  Node SUV 4 on PET Nov 2010 followed by Dr Wilburn Cornelia and gold stage 3 COPD (fev1 1.31/32% in nov 2010), and continued tobacco abuse  OV 12/02/2010: Last seen in Nov 2010. Not seen since then. Since then doing well overall. Smoking relapsed. Wants to quit but refuses chantix and zyban. STates he will try vapors of menthol. Dyspnea is class 2 and stable. Has not followed iwthDr. Wilburn Cornelia regarding neck node; states he sppoke to him and was told it is benign and he is self-monitoring. No problems so far. Mild cough +  IOV 02/08/2018  Chief Complaint  Patient presents with  . pulmonary consult    referred by Dr. Johnsie Cancel for SOB   62 year old male that I essentially saw in 2012 so it is been 7 years. At that time he had Gold stage III COPD based on 2010 PFTs. Ongoing smoking. After that lost to follow-up but he says recently has had few admissions because of COPD exacerbation but he was considered was cardiac related and underwent coronary artery stent placement any somewhat better. At this point in time he tells me that he uses nighttime oxygen but daytime he does not needed.He has a lot of cough and shortness of breath and overall feels stable. He is on bronchodilator with long-acting anticholinergic agent aNORO for some years. He feels stable with this. He says it is expensive. He says any up titration of inhalers though it can be helpful he is very about cost. He does not have any further pulmonary function test because he already knows it is severe and given cardiac history he says he finds it difficult to do PFTs. Walking desaturation test today he did not desaturate. Last CT scan of the chest September 2080 that I personally visualized and shows bilateral pleural plaques and some  congestion. He continues to smoke and says it is very difficult to quit smoking. He does not want any help with quitting smoking. He is agreeable for some limited testing including alpha-1. Blood gas testing shows hypercapnia in April 2019 at the time of admission. He has a history of obstructive sleep apnea but will not wear CPAP due to intolerance and does not want to be questioned about this again  Simple office walk 185 feet x  3 laps goal with forehead probe 02/08/2018    O2 used Room air   Number laps completed 3   Comments about pace x   Resting Pulse Ox/HR 93% and 71/min   Final Pulse Ox/HR 90% and 87/min   Desaturated </= 88% no   Desaturated <= 3% points yes   Got Tachycardic >/= 90/min no   Symptoms at end of test x   Miscellaneous comments x    Results for GLEB, MCGUIRE (MRN [id number]) as of 02/08/2018 16:18  Ref. Range 01/11/2018 14:21  Creatinine Latest Ref Range: 0.70 - 1.30 mg/dL 1.03   Results for MACOY, RODWELL (MRN [id number]) as of 02/08/2018 16:18  Ref. Range 12/07/2017 08:59  pH, Ven Latest Ref Range: 7.250 - 7.430  7.319  pCO2, Ven Latest Ref Range: 44.0 - 60.0 mmHg 64.4 (H)  pO2, Ven Latest Ref Range: 32.0 - 45.0 mmHg 39.0     has a past medical history  of Anginal pain ([place]), Anxiety, Basal cell carcinoma of nose, Blood dyscrasia, Carotid artery disease ([place]), CHF (congestive heart failure) (HCC), Chronic lower back pain, COPD (chronic obstructive pulmonary disease) ([place]), Coronary artery disease, Depression, Diabetes mellitus without complication ([place]), Dysrhythmia, Edema, GERD (gastroesophageal reflux disease), Heart murmur, 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   reports that he quit smoking about 2 months ago. His smoking use included cigarettes. He has a 82.00 pack-year smoking history. He  has quit using smokeless tobacco. His smokeless tobacco use included chew.  Past Surgical History:  Procedure Laterality Date  . BASAL CELL CARCINOMA EXCISION  2000's   nose  . CARDIAC CATHETERIZATION  12/07/2017  . CAROTID ENDARTERECTOMY  03/2008   left  . CORONARY ANGIOPLASTY WITH STENT PLACEMENT  09/29/11   "2"  . CORONARY ARTERY BYPASS GRAFT  2005   CABG X3  . CORONARY STENT INTERVENTION  12/07/2017  . CORONARY STENT INTERVENTION N/A 12/07/2017   Procedure: CORONARY STENT INTERVENTION;  Surgeon: Burnell Blanks, MD;  Location: [facility];  Service: Cardiovascular;  Laterality: N/A;  . evacuation of hematoma  03/2008   left neck; S/P endarterectomy; "cause heparin clotted it up"  . I&D EXTREMITY Left 02/11/2015   Procedure: IRRIGATION AND DEBRIDEMENT LEFT LONG FINGER;  Surgeon: Leanora Cover, MD;  Location: [facility];  Service: Orthopedics;  Laterality: Left;  I and D Left Long Finger  . IR REMOVAL TUN ACCESS W/ PORT W/O FL MOD SED  05/18/2017  . MASS EXCISION Left 12/11/2014   Procedure: EXCISIONAL BIOPSY OF LEFT SUPRA CLAVICULAR NECK MASS;  Surgeon: Jerrell Belfast, MD;  Location: [facility];  Service: ENT;  Laterality: Left;  . PERCUTANEOUS CORONARY STENT INTERVENTION (PCI-S) N/A 09/29/2011   Procedure: PERCUTANEOUS CORONARY STENT INTERVENTION (PCI-S);  Surgeon: Sherren Mocha, MD;  Location: [facility];  Service: Cardiovascular;  Laterality: N/A;  . PORTACATH PLACEMENT  2016  . RIGHT/LEFT HEART CATH AND CORONARY/GRAFT ANGIOGRAPHY N/A 12/07/2017   Procedure: RIGHT/LEFT HEART CATH AND CORONARY/GRAFT ANGIOGRAPHY;  Surgeon: Burnell Blanks, MD;  Location: [place] [facility];  Service: Cardiovascular;  Laterality: N/A;  . SUPERFICIAL LYMPH NODE BIOPSY / EXCISION Left     Allergies  Allergen Reactions  . Bendamustine Hcl Rash    See hospital notes from 01/12/15  . Heparin Other (See Comments)    Opposite reaction Heparin induced thrombocytopenia  . Tape  Rash    Immunization History  Administered Date(s) Administered  . Influenza Split 09/30/2011  . Influenza,inj,Quad PF,6+ Mos 09/22/2013, 09/25/2014, 04/30/2015, 06/23/2016  . Pneumococcal Conjugate-13 09/25/2014  . Pneumococcal Polysaccharide-23 03/30/2015  . Tdap 03/30/2015    Family History  Problem Relation Age of Onset  . Heart disease Father   . Bone cancer Mother      Current Outpatient Medications:  .  acetaminophen (TYLENOL) 325 MG tablet, Take 2 tablets (650 mg total) by mouth every 6 (six) hours as needed for mild pain or headache., Disp: , Rfl:  .  albuterol (PROVENTIL) (2.5 MG/3ML) 0.083% nebulizer solution, Take 2.5 mg by nebulization daily as needed for wheezing or shortness of breath., Disp: , Rfl:  .  ANORO ELLIPTA 62.5-25 MCG/INH AEPB, Inhale 1 puff into the lungs daily as needed. For wheezing & SOB, Disp: 180 each, Rfl: 3 .  aspirin EC 81 MG tablet, Take 81 mg by mouth every evening. , Disp: ,  Rfl:  .  clonazePAM (KLONOPIN) 0.5 MG tablet, TAKE 1 TABLET TWICE A DAY AS NEEDED FOR ANXIETY OR AT BEDTIME FOR INSOMNIA (Patient taking differently: Take 0.5 mg by mouth at bedtime. ), Disp: 180 tablet, Rfl: 1 .  clopidogrel (PLAVIX) 75 MG tablet, TAKE 1 TABLET DAILY WITH   BREAKFAST, Disp: 90 tablet, Rfl: 3 .  KLOR-CON M20 20 MEQ tablet, TAKE 1 TABLET DAILY, Disp: 90 tablet, Rfl: 1 .  metoprolol succinate (TOPROL-XL) 50 MG 24 hr tablet, TAKE 2 TABLETS (100MG )     DAILY WITH OR IMMEDIATELY  FOLLOWING A MEAL (Patient taking differently: TAKE 2 TABLETS (100MG )     DAILY WITH OR IMMEDIATELY  FOLLOWING A MEAL IN THE EVENING), Disp: 180 tablet, Rfl: 3 .  nitroGLYCERIN (NITROSTAT) 0.4 MG SL tablet, Place 1 tablet (0.4 mg total) under the tongue every 5 (five) minutes as needed for chest pain (Up to 3 doses)., Disp: 25 tablet, Rfl: 1 .  oxyCODONE-acetaminophen (PERCOCET/ROXICET) 5-325 MG tablet, Take 1 tablet by mouth every 6 (six) hours as needed for severe pain., Disp: 120 tablet,  Rfl: 0 .  oxymetazoline (AFRIN) 0.05 % nasal spray, Place 1 spray into both nostrils daily as needed for congestion., Disp: , Rfl:  .  simvastatin (ZOCOR) 20 MG tablet, TAKE 1 TABLET AT BEDTIME, Disp: 90 tablet, Rfl: 3 .  torsemide (DEMADEX) 100 MG tablet, TAKE 1 TABLET DAILY, Disp: 90 tablet, Rfl: 3   Review of Systems     Objective:   Physical Exam  Constitutional: He is oriented to person, place, and time. He appears well-developed and well-nourished. No distress.  HENT:  Head: Normocephalic and atraumatic.  Right Ear: External ear normal.  Left Ear: External ear normal.  Mouth/Throat: Oropharynx is clear and moist. No oropharyngeal exudate.  Mallampati class III  Eyes: Pupils are equal, round, and reactive to light. Conjunctivae and EOM are normal. Right eye exhibits no discharge. Left eye exhibits no discharge. No scleral icterus.  Neck: Normal range of motion. Neck supple. No JVD present. No tracheal deviation present. No thyromegaly present.  Cardiovascular: Normal rate, regular rhythm and intact distal pulses. Exam reveals no gallop and no friction rub.  No murmur heard. Pulmonary/Chest: Effort normal and breath sounds normal. No respiratory distress. He has no wheezes. He has no rales. He exhibits no tenderness.  Coarse breath sounds overall distant  Abdominal: Soft. Bowel sounds are normal. He exhibits no distension and no mass. There is no tenderness. There is no rebound and no guarding.  Visceral obesity  Musculoskeletal: Normal range of motion. He exhibits no edema or tenderness.  Gait stoop forwrd  Lymphadenopathy:    He has no cervical adenopathy.  Neurological: He is alert and oriented to person, place, and time. He has normal reflexes. No cranial nerve deficit. Coordination normal.  Skin: Skin is warm and dry. No rash noted. He is not diaphoretic. No erythema. No pallor.  Psychiatric: He has a normal mood and affect. His behavior is normal. Judgment and thought  content normal.  Nursing note and vitals reviewed.  Today's Vitals   02/08/18 1519  BP: 140/80  Pulse: 73  SpO2: 92%  Weight: (!) 333 lb 3.2 oz (151.1 kg)  Height: 6' (1.829 m)    Estimated body mass index is 45.19 kg/m as calculated from the following:   Height as of this encounter: 6' (1.829 m).   Weight as of this encounter: 333 lb 3.2 oz (151.1 kg).      Assessment:  ICD-10-CM   1. Stage 3 severe COPD by GOLD classification ([place]) J44.9   2. History of smoking greater than 50 pack years Z87.891   3. Dyspnea on exertion R06.09   4. Pleural plaque J92.9        Plan:      Severe COPD based on 2010 pulmonary function test Ongoing smoking Overall stable  Plan - continue anoro - try asmanex samples 2 puff twice daily - use albuterol as needed - check alpha 1 AT - hold off PFT due to personal preferrence and knowsn severe copd - CT chest wo contrast in mid sept 2019 (will be 1 year CT)  Followup -  Mid sept 2019 after CT chest -consider ABG at follow-up    Dr. Brand Males, M.D., [facility].C.P Pulmonary and Critical Care Medicine Staff Physician, [place] Director - Interstitial Lung Disease  Program  Pulmonary [place] at [place], [place], [address]  Pager: [phone], If no answer or between  15:00h - 7:00h: call [phone] Telephone: [phone]

## 2018-02-08 NOTE — Patient Instructions (Addendum)
ICD-10-CM   1. Stage 3 severe COPD by GOLD classification ([place]) J44.9   2. History of smoking greater than 50 pack years Z87.891   3. Dyspnea on exertion R06.09   4. Pleural plaque J92.9     Severe COPD based on 2010 pulmonary function test Ongoing smoking Overall stable  Plan - continue anoro - try asmanex samples 2 puff twice daily - use albuterol as needed - check alpha 1 AT - hold off PFT due to personal preferrence and knowsn severe copd - CT chest wo contrast in mid sept 2019 (will be 1 year CT)  Followup -  Mid sept 2019 after CT chest

## 2018-02-12 LAB — ALPHA-1 ANTITRYPSIN PHENOTYPE: A1 ANTITRYPSIN SER: 150 mg/dL (ref 83–199)

## 2018-02-18 ENCOUNTER — Other Ambulatory Visit: Payer: Self-pay | Admitting: Internal Medicine

## 2018-02-20 ENCOUNTER — Other Ambulatory Visit: Payer: Self-pay | Admitting: Internal Medicine

## 2018-02-20 DIAGNOSIS — J449 Chronic obstructive pulmonary disease, unspecified: Secondary | ICD-10-CM | POA: Diagnosis not present

## 2018-02-20 NOTE — Telephone Encounter (Signed)
Check [place] registry last filled 01/16/2018.Marland Kitchen/LMB

## 2018-02-20 NOTE — Telephone Encounter (Signed)
Copied from [place] [id number]. Topic: Quick Communication - See Telephone Encounter >> Feb 20, 2018  2:24 PM Neva Seat wrote: Needing a nurse to call him back to let him know when to come by and pick up the written Rx for his oxyCODONE-acetaminophen (PERCOCET/ROXICET) 5-325 MG tablet.

## 2018-02-20 NOTE — Telephone Encounter (Signed)
Ok Thx 

## 2018-02-21 NOTE — Telephone Encounter (Signed)
Called pt to inform MD ok refill. Pt would like MD to [facility].Marland KitchenJohny Blevins

## 2018-02-22 NOTE — Telephone Encounter (Signed)
Copied from [place] [id number]. Topic: Quick Communication - See Telephone Encounter >> Feb 22, 2018  9:06 AM Percell Belt A wrote: CRM for notification. See Telephone encounter for: 02/22/18. Pt called in and stated the pharmacy doesn't have the clonazePAM (KLONOPIN) 0.5 MG tablet [[id number]].  He checked at 4:30 yesterday and stated [facility] [address] [facility] not [facility] even though we got receipt that they did

## 2018-02-22 NOTE — Telephone Encounter (Signed)
Patient is calling back.  Oxycodone has not gotten filled.  Patient is requesting script to be sent in today for the weekend.

## 2018-02-22 NOTE — Telephone Encounter (Signed)
Dr. Alain Marion pls [facility]../lmb

## 2018-02-22 NOTE — Telephone Encounter (Signed)
Patient checking status.

## 2018-02-22 NOTE — Telephone Encounter (Signed)
[name] with [facility] calling and states that she has spoke with the patient yesterday and today and the patient states that he was told that his Oxycodone was sent to the pharmacy. Informed her that I was not seeing that the medication was sent to the pharmacy. Please advise.

## 2018-02-24 MED ORDER — OXYCODONE-ACETAMINOPHEN 5-325 MG PO TABS: 1.0000 | ORAL_TABLET | Freq: Four times a day (QID) | ORAL | 0 refills | Status: DC | PRN

## 2018-02-25 NOTE — Telephone Encounter (Signed)
Per chart MD [facility] 02/24/18...[name]

## 2018-02-27 ENCOUNTER — Telehealth: Payer: Self-pay | Admitting: Internal Medicine

## 2018-02-27 NOTE — Telephone Encounter (Signed)
Called and spoke with pt letting him know the clarification of labwork per MR. Pt expressed understanding. nothing further needed.

## 2018-02-27 NOTE — Telephone Encounter (Signed)
No evidence of alpha 1 genetic disease causing copd

## 2018-02-27 NOTE — Telephone Encounter (Signed)
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  Pt is wanting more clarification about what it means by MM.  Does that mean that the result is normal or what?   MR, please advise on this for pt. Thanks!

## 2018-03-15 ENCOUNTER — Encounter: Payer: Self-pay | Admitting: Internal Medicine

## 2018-03-15 ENCOUNTER — Ambulatory Visit: Payer: BLUE CROSS/BLUE SHIELD | Admitting: Internal Medicine

## 2018-03-15 DIAGNOSIS — I10 Essential (primary) hypertension: Secondary | ICD-10-CM | POA: Diagnosis not present

## 2018-03-15 DIAGNOSIS — M545 Low back pain: Secondary | ICD-10-CM

## 2018-03-15 DIAGNOSIS — C8228 Follicular lymphoma grade III, unspecified, lymph nodes of multiple sites: Secondary | ICD-10-CM

## 2018-03-15 DIAGNOSIS — I5032 Chronic diastolic (congestive) heart failure: Secondary | ICD-10-CM | POA: Diagnosis not present

## 2018-03-15 DIAGNOSIS — M79605 Pain in left leg: Secondary | ICD-10-CM

## 2018-03-15 DIAGNOSIS — F411 Generalized anxiety disorder: Secondary | ICD-10-CM | POA: Diagnosis not present

## 2018-03-15 MED ORDER — OXYCODONE-ACETAMINOPHEN 5-325 MG PO TABS: 1.0000 | ORAL_TABLET | Freq: Four times a day (QID) | ORAL | 0 refills | Status: DC | PRN

## 2018-03-15 NOTE — Assessment & Plan Note (Signed)
Clonazepam prn Not to take w/pain meds  Potential benefits of a long term benzodiazepines  use as well as potential risks  and complications were explained to the patient and were aknowledged.

## 2018-03-15 NOTE — Progress Notes (Signed)
Subjective:  Patient ID: [name], male    DOB: Mar 29, 1956  Age: 62 y.o. MRN: [id number]  CC: No chief complaint on file.   HPI [name] presents for LBP, neuropathy, COPD f/u  Outpatient Medications Prior to Visit  Medication Sig Dispense Refill  . acetaminophen (TYLENOL) 325 MG tablet Take 2 tablets (650 mg total) by mouth every 6 (six) hours as needed for mild pain or headache.    . albuterol (PROVENTIL) (2.5 MG/3ML) 0.083% nebulizer solution Take 2.5 mg by nebulization daily as needed for wheezing or shortness of breath.    [name] ELLIPTA 62.5-25 MCG/INH AEPB Inhale 1 puff into the lungs daily as needed. For wheezing & SOB 180 each 3  . aspirin EC 81 MG tablet Take 81 mg by mouth every evening.     . clonazePAM (KLONOPIN) 0.5 MG tablet TAKE 1 TABLET TWICE A DAY AS NEEDED FOR ANXIETY OR AT BEDTIME FOR INSOMNIA 60 tablet 3  . clopidogrel (PLAVIX) 75 MG tablet TAKE 1 TABLET DAILY WITH   BREAKFAST 90 tablet 3  . KLOR-CON M20 20 MEQ tablet TAKE 1 TABLET DAILY 90 tablet 1  . metoprolol succinate (TOPROL-XL) 50 MG 24 hr tablet TAKE 2 TABLETS (100MG )     DAILY WITH OR IMMEDIATELY  FOLLOWING A MEAL (Patient taking differently: TAKE 2 TABLETS (100MG )     DAILY WITH OR IMMEDIATELY  FOLLOWING A MEAL IN THE EVENING) 180 tablet 3  . oxyCODONE-acetaminophen (PERCOCET/ROXICET) 5-325 MG tablet Take 1 tablet by mouth every 6 (six) hours as needed for severe pain. 120 tablet 0  . oxymetazoline (AFRIN) 0.05 % nasal spray Place 1 spray into both nostrils daily as needed for congestion.    . simvastatin (ZOCOR) 20 MG tablet TAKE 1 TABLET AT BEDTIME 90 tablet 3  . torsemide (DEMADEX) 100 MG tablet TAKE 1 TABLET DAILY 90 tablet 3  . nitroGLYCERIN (NITROSTAT) 0.4 MG SL tablet Place 1 tablet (0.4 mg total) under the tongue every 5 (five) minutes as needed for chest pain (Up to 3 doses). 25 tablet 1   No facility-administered medications prior to visit.     ROS: Review of Systems    Constitutional: Negative for appetite change, fatigue and unexpected weight change.  HENT: Negative for congestion, nosebleeds, sneezing, sore throat and trouble swallowing.   Eyes: Negative for itching and visual disturbance.  Respiratory: Negative for cough.   Cardiovascular: Positive for leg swelling. Negative for chest pain and palpitations.  Gastrointestinal: Negative for abdominal distention, blood in stool, diarrhea and nausea.  Genitourinary: Negative for frequency and hematuria.  Musculoskeletal: Positive for arthralgias, back pain, gait problem, myalgias and neck pain. Negative for joint swelling.  Skin: Positive for color change. Negative for rash.  Neurological: Negative for dizziness, tremors, speech difficulty and weakness.  Psychiatric/Behavioral: Negative for agitation, dysphoric mood and sleep disturbance. The patient is not nervous/anxious.     Objective:  BP 138/72 (BP Location: Left Arm, Patient Position: Sitting, Cuff Size: Large)   Pulse 75   Temp 98.7 F (37.1 C) (Oral)   Ht 6' (1.829 m)   Wt (!) 332 lb (150.6 kg)   SpO2 92%   BMI 45.03 kg/m   BP Readings from Last 3 Encounters:  03/15/18 138/72  02/08/18 140/80  01/11/18 (!) 144/70    Wt Readings from Last 3 Encounters:  03/15/18 (!) 332 lb (150.6 kg)  02/08/18 (!) 333 lb 3.2 oz (151.1 kg)  01/11/18 (!) 332 lb 8 oz (150.8  kg)    Physical Exam  Constitutional: He is oriented to person, place, and time. He appears well-developed. No distress.  NAD  HENT:  Mouth/Throat: Oropharynx is clear and moist.  Eyes: Pupils are equal, round, and reactive to light. Conjunctivae are normal.  Neck: Normal range of motion. No JVD present. No thyromegaly present.  Cardiovascular: Normal rate, regular rhythm, normal heart sounds and intact distal pulses. Exam reveals no gallop and no friction rub.  No murmur heard. Pulmonary/Chest: Effort normal and breath sounds normal. No respiratory distress. He has no wheezes.  He has no rales. He exhibits no tenderness.  Abdominal: Soft. Bowel sounds are normal. He exhibits no distension and no mass. There is no tenderness. There is no rebound and no guarding.  Musculoskeletal: Normal range of motion. He exhibits edema and tenderness.  Lymphadenopathy:    He has no cervical adenopathy.  Neurological: He is alert and oriented to person, place, and time. He has normal reflexes. No cranial nerve deficit. He exhibits normal muscle tone. He displays a negative Romberg sign. Coordination and gait normal.  Skin: Skin is warm and dry. No rash noted.  Psychiatric: He has a normal mood and affect. His behavior is normal. Judgment and thought content normal.  LE w/trace edema, pyrple Obese LS tender  Lab Results  Component Value Date   WBC 9.3 01/11/2018   HGB 14.0 01/11/2018   HCT 41.9 01/11/2018   PLT 134 (L) 01/11/2018   GLUCOSE 93 01/11/2018   CHOL 108 01/01/2018   TRIG 98 01/01/2018   HDL 35 (L) 01/01/2018   LDLCALC 53 01/01/2018   ALT 22 01/11/2018   AST 19 01/11/2018   NA 141 01/11/2018   K 4.4 01/11/2018   CL 99 01/11/2018   CREATININE 1.03 01/11/2018   BUN 18 01/11/2018   CO2 35 (H) 01/11/2018   TSH 1.60 09/11/2017   PSA 0.23 09/15/2011   INR 1.0 11/28/2017   HGBA1C 6.0 09/11/2017   MICROALBUR 0.7 03/29/2016    No results found.  Assessment & Plan:   There are no diagnoses linked to this encounter.   No orders of the defined types were placed in this encounter.    Follow-up: No follow-ups on file.  Walker Kehr, MD

## 2018-03-15 NOTE — Assessment & Plan Note (Signed)
Torsemide, Hydralazine 

## 2018-03-15 NOTE — Assessment & Plan Note (Signed)
Class 3

## 2018-03-15 NOTE — Assessment & Plan Note (Signed)
On Torsemide, Hydralazine compensated

## 2018-03-15 NOTE — Assessment & Plan Note (Signed)
Off chemo

## 2018-03-15 NOTE — Assessment & Plan Note (Signed)
Percocet prn Not to take w/Clonazepam  Potential benefits of a long term opioids use as well as potential risks (i.e. addiction risk, apnea etc) and complications (i.e. Somnolence, constipation and others) were explained to the patient and were aknowledged. 

## 2018-03-23 DIAGNOSIS — J449 Chronic obstructive pulmonary disease, unspecified: Secondary | ICD-10-CM | POA: Diagnosis not present

## 2018-04-01 ENCOUNTER — Telehealth: Payer: Self-pay | Admitting: Internal Medicine

## 2018-04-01 NOTE — Telephone Encounter (Signed)
Spoke with pt. Advised him that this CT is still necessary. Nothing further was needed.

## 2018-04-05 ENCOUNTER — Ambulatory Visit: Payer: BLUE CROSS/BLUE SHIELD | Admitting: Cardiovascular Disease

## 2018-04-11 ENCOUNTER — Other Ambulatory Visit: Payer: Self-pay

## 2018-04-11 MED ORDER — METOPROLOL SUCCINATE ER 50 MG PO TB24: ORAL_TABLET | ORAL | 3 refills | Status: DC

## 2018-04-15 NOTE — Progress Notes (Signed)
CARDIOLOGY OFFICE NOTE  Date:  04/18/2018    [name] Date of Birth: 17-May-1956 Medical Record #[id number]  PCP:  [name], MD  Cardiologist:  Andrez Grime chief complaint on file.   History of Present Illness: CHASEN MENDELL is a 62 y.o. male  With a history of obesity, known CAD with prior CABG in 2005, left CEA 2009, HTN, HLD, GERD, OSA, COPD and follicular lymphoma diagnosed in 2016.   Cath 09/29/11 with patent LIMA to LAD. Received DES to SVG Ramus and DES to SVG PDA at that time.   TTE 11/23/17 with normal EF at 55 to 89%, normal diastolic parameters and no significant valve disease. Saw me 11/28/17 - fatigued, some dyspnea and right sided chest pain endorsed. Not felt to be a good candidate for non invasive testing - referred for cardiac cath  12/07/17 with severe in-stent restenosis of proximal RCA graft  SVG to OM patent and LIMA to LAD patent  Right heart with mean RA 15 mmHg PA 48/26 mmHg PCWP 26 mmHg   Doing well with no angina Mild wheezing from COPD sees Ramaswamy has Breo    Past Medical History:  Diagnosis Date  . Anginal pain ([place])    not had to use in awhile  none in 10 years  . Anxiety   . Basal cell carcinoma of nose   . Blood dyscrasia    trouble clotting   . Carotid artery disease ([place])    s/p L CEA 2009 (hx of evacuation of hematoma due to heparin)  . CHF (congestive heart failure) ([place])   . Chronic lower back pain   . COPD (chronic obstructive pulmonary disease) ([place])   . Coronary artery disease    CABG 2005. s/p PTCA and stenting of the saphenous vein graft to PDA and saphenous vein graft to obtuse marginal by Dr. Burt Knack 09/29/11. Normal EF at cath 09/2011  . Depression   . Diabetes mellitus without complication (HCC)    borderline   . Dysrhythmia    fluttering  . Edema   . GERD (gastroesophageal reflux disease)   . Heart murmur   . 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 History of home oxygen therapy    2 liters at  night prn  . Hyperlipidemia   . Hypertension   . Lymphoma ([place]) 12/21/2014  . Myocardial infarction ([place])   . Obesity   . Shortness of breath dyspnea    with exertion   . Sleep apnea    "used to"      could not use 2006    Past Surgical History:  Procedure Laterality Date  . BASAL CELL CARCINOMA EXCISION  2000's   nose  . CARDIAC CATHETERIZATION  12/07/2017  . CAROTID ENDARTERECTOMY  03/2008   left  . CORONARY ANGIOPLASTY WITH STENT PLACEMENT  09/29/11   "2"  . CORONARY ARTERY BYPASS GRAFT  2005   CABG X3  . CORONARY STENT INTERVENTION  12/07/2017  . CORONARY STENT INTERVENTION N/A 12/07/2017   Procedure: CORONARY STENT INTERVENTION;  Surgeon: Burnell Blanks, MD;  Location: [facility];  Service: Cardiovascular;  Laterality: N/A;  . evacuation of hematoma  03/2008   left neck; S/P endarterectomy; "cause heparin clotted it up"  . I&D EXTREMITY Left 02/11/2015   Procedure: IRRIGATION AND DEBRIDEMENT LEFT LONG FINGER;  Surgeon: Leanora Cover, MD;  Location: [facility];  Service: Orthopedics;  Laterality: Left;  I and  D Left Long Finger  . IR REMOVAL TUN ACCESS W/ PORT W/O FL MOD SED  05/18/2017  . MASS EXCISION Left 12/11/2014   Procedure: EXCISIONAL BIOPSY OF LEFT SUPRA CLAVICULAR NECK MASS;  Surgeon: Jerrell Belfast, MD;  Location: [facility];  Service: ENT;  Laterality: Left;  . PERCUTANEOUS CORONARY STENT INTERVENTION (PCI-S) N/A 09/29/2011   Procedure: PERCUTANEOUS CORONARY STENT INTERVENTION (PCI-S);  Surgeon: Sherren Mocha, MD;  Location[facility];  Service: Cardiovascular;  Laterality: N/A;  . PORTACATH PLACEMENT  2016  . RIGHT/LEFT HEART CATH AND CORONARY/GRAFT ANGIOGRAPHY N/A 12/07/2017   Procedure: RIGHT/LEFT HEART CATH AND CORONARY/GRAFT ANGIOGRAPHY;  Surgeon: Burnell Blanks, MD;  Location: [facility];  Service: Cardiovascular;  Laterality: N/A;  . SUPERFICIAL LYMPH NODE BIOPSY / EXCISION Left      Medications: Current Meds   Medication Sig  . acetaminophen (TYLENOL) 325 MG tablet Take 2 tablets (650 mg total) by mouth every 6 (six) hours as needed for mild pain or headache.  . albuterol (PROVENTIL) (2.5 MG/3ML) 0.083% nebulizer solution Take 2.5 mg by nebulization daily as needed for wheezing or shortness of breath.  [name] ELLIPTA 62.5-25 MCG/INH AEPB Inhale 1 puff into the lungs daily as needed. For wheezing & SOB  . aspirin EC 81 MG tablet Take 81 mg by mouth every evening.   . clonazePAM (KLONOPIN) 0.5 MG tablet TAKE 1 TABLET TWICE A DAY AS NEEDED FOR ANXIETY OR AT BEDTIME FOR INSOMNIA  . clopidogrel (PLAVIX) 75 MG tablet TAKE 1 TABLET DAILY WITH   BREAKFAST  . KLOR-CON M20 20 MEQ tablet TAKE 1 TABLET DAILY  . metoprolol succinate (TOPROL-XL) 50 MG 24 hr tablet TAKE 2 TABLETS (100MG )     DAILY WITH OR IMMEDIATELY  FOLLOWING A MEAL  . nitroGLYCERIN (NITROSTAT) 0.4 MG SL tablet Place 1 tablet (0.4 mg total) under the tongue every 5 (five) minutes as needed for chest pain (Up to 3 doses).  Marland Kitchen oxyCODONE-acetaminophen (PERCOCET/ROXICET) 5-325 MG tablet Take 1 tablet by mouth every 6 (six) hours as needed for severe pain. Please fill on or after 04/16/18  . oxymetazoline (AFRIN) 0.05 % nasal spray Place 1 spray into both nostrils daily as needed for congestion.  . simvastatin (ZOCOR) 20 MG tablet TAKE 1 TABLET AT BEDTIME  . torsemide (DEMADEX) 100 MG tablet TAKE 1 TABLET DAILY     Allergies: Allergies  Allergen Reactions  . Bendamustine Hcl Rash    See hospital notes from 01/12/15  . Heparin Other (See Comments)    Opposite reaction Heparin induced thrombocytopenia  . Tape Rash    Social History: The patient  reports that he quit smoking about 4 months ago. His smoking use included cigarettes. He has a 82.00 pack-year smoking history. He has quit using smokeless tobacco.  His smokeless tobacco use included chew. He reports that he has current or past drug history. Drug: Marijuana. He reports that he does not  drink alcohol.   Family History: The patient's family history includes Bone cancer in his mother; Heart disease in his father.   Review of Systems: Please see the history of present illness.   Otherwise, the review of systems is positive for none.   All other systems are reviewed and negative.   Physical Exam: VS:  BP (!) 128/56   Pulse 62   Ht 6' (1.829 m)   Wt (!) 330 lb 12 oz (150 kg)   SpO2 91%   BMI 44.86 kg/m  .  BMI  Body mass index is 44.86 kg/m.  Wt Readings from Last 3 Encounters:  04/18/18 (!) 330 lb 12 oz (150 kg)  03/15/18 (!) 332 lb (150.6 kg)  02/08/18 (!) 333 lb 3.2 oz (151.1 kg)    Affect appropriate Obese white male  HEENT: normal Neck supple with no adenopathy JVP normal no bruits no thyromegaly post left CEA  Lungs mild expiratory wheezing  Heart:  S1/S2 SEM radiates to both carotids no diastolic  murmur, no rub, gallop or click PMI normal Abdomen: benighn, BS positve, no tenderness, no AAA no bruit.  No HSM or HJR Distal pulses intact with no bruits No edema Neuro non-focal Skin warm and dry No muscular weakness    LABORATORY DATA:  EKG:  EKG is not ordered today.  Lab Results  Component Value Date   WBC 9.3 01/11/2018   HGB 14.0 01/11/2018   HCT 41.9 01/11/2018   PLT 134 (L) 01/11/2018   GLUCOSE 93 01/11/2018   CHOL 108 01/01/2018   TRIG 98 01/01/2018   HDL 35 (L) 01/01/2018   LDLCALC 53 01/01/2018   ALT 22 01/11/2018   AST 19 01/11/2018   NA 141 01/11/2018   K 4.4 01/11/2018   CL 99 01/11/2018   CREATININE 1.03 01/11/2018   BUN 18 01/11/2018   CO2 35 (H) 01/11/2018   TSH 1.60 09/11/2017   PSA 0.23 09/15/2011   INR 1.0 11/28/2017   HGBA1C 6.0 09/11/2017   MICROALBUR 0.7 03/29/2016     BNP (last 3 results) Recent Labs    11/15/17 1752  BNP 150.6*    ProBNP (last 3 results) No results for input(s): PROBNP in the last 8760 hours.   Other Studies Reviewed Today:  Procedures   CORONARY STENT INTERVENTION 12/07/2017   RIGHT/LEFT HEART CATH AND CORONARY/GRAFT ANGIOGRAPHY  Conclusion     Prox RCA lesion is 100% stenosed.  SVG graft was visualized by angiography and is normal in caliber.  Prox Graft lesion is 99% stenosed.  A drug-eluting stent was successfully placed using a STENT RESOLUTE ONYX 4.0X22.  Post intervention, there is a 0% residual stenosis.  Mid LM to Ost LAD lesion is 50% stenosed.  Ost 1st Mrg lesion is 100% stenosed.  Origin to Prox Graft lesion is 100% stenosed.  Ost LAD to Prox LAD lesion is 90% stenosed.  Hemodynamic findings consistent with moderate pulmonary hypertension.   1. Severe triple vessel CAD s/p 3V CABG with 2/3 patent bypass grafts 2. The left main has a distal 50% stenosis.  3. The LAD has severe proximal stenosis. The mid and distal LAD fills from the patent LIMA graft.  4. The Circumflex has mild diffuse disease. The intermediate is a large caliber vessel with proximal occlusion. The vein graft to the intermediate branch is occluded in the proximal stent.  5. The RCA is occluded in the proximal segment. The distal vessel fills from the patent vein graft. The stent in the proximal segment of the vein graft has severe in stent restenosis.  6. Elevated filling pressures  Recommendations: Will continue DAPT with ASA and Plavix. Continue beta blocker and statin. He would benefit from additional diuresis.     Echo Study Conclusions April 2019  - Left ventricle: The cavity size was mildly dilated. There was   moderate concentric hypertrophy. Systolic function was normal.   The estimated ejection fraction was in the range of 55% to 60%.   Wall motion was normal; there were no regional wall motion   abnormalities.  Left ventricular diastolic function parameters   were normal. - Aortic valve: There was trivial regurgitation. - Aorta: Aortic root dimension: 40 mm (ED). - Aortic root: The aortic root was mildly dilated. - Left atrium: The atrium was  moderately dilated. - Right ventricle: The cavity size was mildly dilated. Wall   thickness was normal. Systolic function was mildly reduced.    Result Notes for VAS US CAROTID   Notes recorded by Michaelyn Barter, RN on 12/31/2017 at 2:54 PM EDT Patient aware of carotid results. Per Dr. Johnsie Cancel, plaque no stenosis f/u carotid duplex in 2 years. Patient verbalized understanding. Will send copy to patient's PCP. ------      Assessment/Plan:  1. CAD with remote CABG in 2005, stent to SVG to IM and SVG to PDA in 2013 - 12/07/17 s/p PCI with DES to SVG to RCA - remains on DAPT. Angina resolved continue medical Rx   2. HTN - Well controlled.  Continue current medications and low sodium Dash type diet.    3. HLD - on statin -   LDL 53 01/01/18    4. Lymphoma - says he has finished chemo and is remission.   5. Carotid stenosis - plaque no stenosis on duplex 12/28/17 f/u duplex May 2021 History of left CEA   6. Murmur - recent echo with no significant valve disease  7. COPD with admission last month for exacerbation. This has improved.   8. Obesity discussed portion size and low carb diet with exercise   Current medicines are reviewed with the patient today.  The patient does not have concerns regarding medicines other than what has been noted above.  The following changes have been made:  See above.  Labs/ tests ordered today include:    No orders of the defined types were placed in this encounter.    Disposition:   FU in a year   Jenkins Rouge

## 2018-04-18 ENCOUNTER — Ambulatory Visit: Payer: BLUE CROSS/BLUE SHIELD | Admitting: Cardiovascular Disease

## 2018-04-18 ENCOUNTER — Encounter: Payer: Self-pay | Admitting: Cardiovascular Disease

## 2018-04-18 VITALS — BP 128/56 | HR 62 | Ht 72.0 in | Wt 330.8 lb

## 2018-04-18 DIAGNOSIS — I2510 Atherosclerotic heart disease of native coronary artery without angina pectoris: Secondary | ICD-10-CM

## 2018-04-18 NOTE — Patient Instructions (Signed)

## 2018-04-23 DIAGNOSIS — J449 Chronic obstructive pulmonary disease, unspecified: Secondary | ICD-10-CM | POA: Diagnosis not present

## 2018-05-01 ENCOUNTER — Inpatient Hospital Stay: Admission: RE | Admit: 2018-05-01 | Payer: BLUE CROSS/BLUE SHIELD | Source: Ambulatory Visit

## 2018-05-03 ENCOUNTER — Ambulatory Visit (INDEPENDENT_AMBULATORY_CARE_PROVIDER_SITE_OTHER)
Admission: RE | Admit: 2018-05-03 | Discharge: 2018-05-03 | Disposition: A | Discharge status: Home / Self Care | Payer: BLUE CROSS/BLUE SHIELD | Source: Ambulatory Visit | Attending: Internal Medicine | Admitting: Internal Medicine

## 2018-05-03 ENCOUNTER — Ambulatory Visit: Payer: BLUE CROSS/BLUE SHIELD | Admitting: Internal Medicine

## 2018-05-03 DIAGNOSIS — J90 Pleural effusion, not elsewhere classified: Secondary | ICD-10-CM | POA: Diagnosis not present

## 2018-05-03 DIAGNOSIS — J449 Chronic obstructive pulmonary disease, unspecified: Secondary | ICD-10-CM

## 2018-05-09 ENCOUNTER — Telehealth: Payer: Self-pay | Admitting: Internal Medicine

## 2018-05-09 NOTE — Telephone Encounter (Signed)
Renewal for parking placard has been completed & placed in providers box to sign.  Patient is aware the provider is out of the office until 9/30.

## 2018-05-09 NOTE — Telephone Encounter (Signed)
Patient has dropped off parking placard application.  I have placed in Brittany's box for tracking purposes.

## 2018-05-13 ENCOUNTER — Ambulatory Visit (INDEPENDENT_AMBULATORY_CARE_PROVIDER_SITE_OTHER): Payer: BLUE CROSS/BLUE SHIELD | Admitting: Internal Medicine

## 2018-05-13 ENCOUNTER — Encounter: Payer: Self-pay | Admitting: Internal Medicine

## 2018-05-13 VITALS — BP 130/82 | HR 64 | Ht 72.0 in | Wt 325.2 lb

## 2018-05-13 DIAGNOSIS — J929 Pleural plaque without asbestos: Secondary | ICD-10-CM | POA: Diagnosis not present

## 2018-05-13 DIAGNOSIS — Z87891 Personal history of nicotine dependence: Secondary | ICD-10-CM

## 2018-05-13 DIAGNOSIS — J449 Chronic obstructive pulmonary disease, unspecified: Secondary | ICD-10-CM | POA: Diagnosis not present

## 2018-05-13 MED ORDER — ALBUTEROL SULFATE (2.5 MG/3ML) 0.083% IN NEBU: 2.5000 mg | INHALATION_SOLUTION | Freq: Every day | RESPIRATORY_TRACT | 11 refills | Status: DC | PRN

## 2018-05-13 NOTE — Addendum Note (Signed)
Addended by: Lorretta Harp on: 05/13/2018 04:35 PM   Modules accepted: Orders

## 2018-05-13 NOTE — Patient Instructions (Addendum)
Stage 3 severe COPD by GOLD classification ([place])  History of smoking greater than 50 pack years  Pleural plaque    COPD stable with Anoro daily Too bad he did not tolerate the inhaler [place] Radiologist thought he might have early pulmonary fibrosis on CT scan but I do not think so  PLAN  -Respect deferral of flu shot but please make sure to get it with the primary care doctor -Continue Anoro daily -Use albuterol as needed -CMA to give you refill on albuterol nebulizer as needed  Followup 9-12 months in pulmonary clinic with Dr. [name] Caller or sooner if needed

## 2018-05-13 NOTE — Addendum Note (Signed)
Addended by: Lorretta Harp on: 05/13/2018 04:36 PM   Modules accepted: Orders

## 2018-05-13 NOTE — Progress Notes (Signed)
PCP Plotnikov, [name], MD   HPI  morbidly obese male with CAD and OSA (non compliant with CPAP). Left sided NECk  Node SUV 4 on PET Nov 2010 followed by Dr Wilburn Cornelia and gold stage 3 COPD (fev1 1.31/32% in nov 2010), and continued tobacco abuse  OV 12/02/2010: Last seen in Nov 2010. Not seen since then. Since then doing well overall. Smoking relapsed. Wants to quit but refuses chantix and zyban. STates he will try vapors of menthol. Dyspnea is class 2 and stable. Has not followed iwthDr. Wilburn Cornelia regarding neck node; states he sppoke to him and was told it is benign and he is self-monitoring. No problems so far. Mild cough +  IOV 02/08/2018  Chief Complaint  Patient presents with  . pulmonary consult    referred by Dr. Johnsie Cancel for SOB   62 year old male that I essentially saw in 2012 so it is been 7 years. At that time he had Gold stage III COPD based on 2010 PFTs. Ongoing smoking. After that lost to follow-up but he says recently has had few admissions because of COPD exacerbation but he was considered was cardiac related and underwent coronary artery stent placement any somewhat better. At this point in time he tells me that he uses nighttime oxygen but daytime he does not needed.He has a lot of cough and shortness of breath and overall feels stable. He is on bronchodilator with long-acting anticholinergic agent aNORO for some years. He feels stable with this. He says it is expensive. He says any up titration of inhalers though it can be helpful he is very about cost. He does not have any further pulmonary function test because he already knows it is severe and given cardiac history he says he finds it difficult to do PFTs. Walking desaturation test today he did not desaturate. Last CT scan of the chest September 2080 that I personally visualized and shows bilateral pleural plaques and some congestion. He continues to smoke and says it is very difficult to quit smoking. He does not want  any help with quitting smoking. He is agreeable for some limited testing including alpha-1. Blood gas testing shows hypercapnia in April 2019 at the time of admission. He has a history of obstructive sleep apnea but will not wear CPAP due to intolerance and does not want to be questioned about this again   Results for KI, CORBO (MRN [id number]) as of 02/08/2018 16:18  Ref. Range 01/11/2018 14:21  Creatinine Latest Ref Range: 0.70 - 1.30 mg/dL 1.03   Results for EARLE, TROIANO (MRN [id number]) as of 02/08/2018 16:18  Ref. Range 12/07/2017 08:59  pH, Ven Latest Ref Range: 7.250 - 7.430  7.319  pCO2, Ven Latest Ref Range: 44.0 - 60.0 mmHg 64.4 (H)  pO2, Ven Latest Ref Range: 32.0 - 45.0 mmHg 39.0     OV 05/13/2018  Subjective:  Patient ID: [name], male , DOB: 30-May-1956 , age 62 y.o. , MRN: [id number] , ADDRESS: [place] [address]   05/13/2018 -   Chief Complaint  Patient presents with  . Follow-up    CT performed 9/20.  Pt states he has been doing well since last visit and denies any complaints.     HPI [name] 62 y.o. -routine follow-up for his severe COPD particularly after getting CT scan of the chest in September 2019.  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  Radiologist thinks that he might have early onset asbestosis but I do not think so.  He only has atelectasis.  He is feeling well.  He takes Anoro.  I gave him some Asmanex inhaled steroid to try but he did not like it.  I offered him flu shot but he says he will get it with his primary care physician will just works one floor below [place] but for some reason he does not want to get it with [place].  His alpha-1 antitrypsin is normal at MM.  COPD CAT score is 14 and shows minimal level of symptoms.   CAT COPD Symptom & Quality of Life Score (GSK trademark) 0 is no burden. 5 is highest burden 05/13/2018   Never Cough -> Cough  all the time 3  No phlegm in chest -> Chest is full of phlegm 2  No chest tightness -> Chest feels very tight 0  No dyspnea for 1 flight stairs/hill -> Very dyspneic for 1 flight of stairs 4  No limitations for ADL at home -> Very limited with ADL at home 1  Confident leaving home -> Not at all confident leaving home 0  Sleep soundly -> Do not sleep soundly because of lung condition 0  Lots of Energy -> No energy at all 4  TOTAL Score (max 40)  14      IMPRESSION: Asbestos related pleural disease. 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  No suspicious lymphadenopathy in this patient with history of lymphoma. Spleen is normal in size.  Aortic Atherosclerosis (ICD10-I70.0) and Emphysema (ICD10-J43.9).   Electronically Signed   By: Julian Hy M.D.   On: 05/03/2018 14:18  Simple office walk 185 feet x  3 laps goal with forehead probe 02/08/2018    O2 used Room air   Number laps completed 3   Comments about pace x   Resting Pulse Ox/HR 93% and 71/min   Final Pulse Ox/HR 90% and 87/min   Desaturated </= 88% no   Desaturated <= 3% points yes   Got Tachycardic >/= 90/min no   Symptoms at end of test x   Miscellaneous comments x     ROS - per HPI     has a past medical history of Anginal pain ([place]), Anxiety, Basal cell carcinoma of nose, Blood dyscrasia, Carotid artery disease ([place]), CHF (congestive heart failure) ([facility]), Chronic lower back pain, COPD (chronic obstructive pulmonary disease) ([place]), Coronary artery disease, Depression, Diabetes mellitus without complication ([place]), Dysrhythmia, Edema, GERD (gastroesophageal reflux disease), Heart murmur, 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   reports that he quit smoking about 5 months ago. His smoking  use included cigarettes. He has a 82.00 pack-year smoking history. He has quit using smokeless tobacco.  His smokeless tobacco use included chew.  Past Surgical History:  Procedure Laterality Date  . BASAL CELL CARCINOMA EXCISION  2000's   nose  . CARDIAC CATHETERIZATION  12/07/2017  . CAROTID ENDARTERECTOMY  03/2008   left  . CORONARY ANGIOPLASTY WITH STENT PLACEMENT  09/29/11   "2"  . CORONARY ARTERY BYPASS GRAFT  2005   CABG X3  . CORONARY STENT INTERVENTION  12/07/2017  . CORONARY STENT INTERVENTION N/A 12/07/2017   Procedure: CORONARY STENT INTERVENTION;  Surgeon: Burnell Blanks, MD;  Location: [facility];  Service: Cardiovascular;  Laterality:  N/A;  . evacuation of hematoma  03/2008   left neck; S/P endarterectomy; "cause heparin clotted it up"  . I&D EXTREMITY Left 02/11/2015   Procedure: IRRIGATION AND DEBRIDEMENT LEFT LONG FINGER;  Surgeon: Leanora Cover, MD;  Location: [facility];  Service: Orthopedics;  Laterality: Left;  I and D Left Long Finger  . IR REMOVAL TUN ACCESS W/ PORT W/O FL MOD SED  05/18/2017  . MASS EXCISION Left 12/11/2014   Procedure: EXCISIONAL BIOPSY OF LEFT SUPRA CLAVICULAR NECK MASS;  Surgeon: Jerrell Belfast, MD;  Location: [facility];  Service: ENT;  Laterality: Left;  . PERCUTANEOUS CORONARY STENT INTERVENTION (PCI-S) N/A 09/29/2011   Procedure: PERCUTANEOUS CORONARY STENT INTERVENTION (PCI-S);  Surgeon: Sherren Mocha, MD;  Location: [facility];  Service: Cardiovascular;  Laterality: N/A;  . PORTACATH PLACEMENT  2016  . RIGHT/LEFT HEART CATH AND CORONARY/GRAFT ANGIOGRAPHY N/A 12/07/2017   Procedure: RIGHT/LEFT HEART CATH AND CORONARY/GRAFT ANGIOGRAPHY;  Surgeon: Burnell Blanks, MD;  Location: [facility];  Service: Cardiovascular;  Laterality: N/A;  . SUPERFICIAL LYMPH NODE BIOPSY / EXCISION Left     Allergies  Allergen Reactions  . Bendamustine Hcl Rash    See hospital notes from 01/12/15  . Heparin Other (See  Comments)    Opposite reaction Heparin induced thrombocytopenia  . Tape Rash    Immunization History  Administered Date(s) Administered  . Influenza Split 09/30/2011  . Influenza,inj,Quad PF,6+ Mos 09/22/2013, 09/25/2014, 04/30/2015, 06/23/2016  . Pneumococcal Conjugate-13 09/25/2014  . Pneumococcal Polysaccharide-23 03/30/2015  . Tdap 03/30/2015    Family History  Problem Relation Age of Onset  . Heart disease Father   . Bone cancer Mother      Current Outpatient Medications:  .  acetaminophen (TYLENOL) 325 MG tablet, Take 2 tablets (650 mg total) by mouth every 6 (six) hours as needed for mild pain or headache., Disp: , Rfl:  .  albuterol (PROVENTIL) (2.5 MG/3ML) 0.083% nebulizer solution, Take 2.5 mg by nebulization daily as needed for wheezing or shortness of breath., Disp: , Rfl:  .  ANORO ELLIPTA 62.5-25 MCG/INH AEPB, Inhale 1 puff into the lungs daily as needed. For wheezing & SOB, Disp: 180 each, Rfl: 3 .  aspirin EC 81 MG tablet, Take 81 mg by mouth every evening. , Disp: , Rfl:  .  clonazePAM (KLONOPIN) 0.5 MG tablet, TAKE 1 TABLET TWICE A DAY AS NEEDED FOR ANXIETY OR AT BEDTIME FOR INSOMNIA, Disp: 60 tablet, Rfl: 3 .  clopidogrel (PLAVIX) 75 MG tablet, TAKE 1 TABLET DAILY WITH   BREAKFAST, Disp: 90 tablet, Rfl: 3 .  KLOR-CON M20 20 MEQ tablet, TAKE 1 TABLET DAILY, Disp: 90 tablet, Rfl: 1 .  metoprolol succinate (TOPROL-XL) 50 MG 24 hr tablet, TAKE 2 TABLETS (100MG )     DAILY WITH OR IMMEDIATELY  FOLLOWING A MEAL, Disp: 180 tablet, Rfl: 3 .  oxyCODONE-acetaminophen (PERCOCET/ROXICET) 5-325 MG tablet, Take 1 tablet by mouth every 6 (six) hours as needed for severe pain. Please fill on or after 04/16/18, Disp: 120 tablet, Rfl: 0 .  oxymetazoline (AFRIN) 0.05 % nasal spray, Place 1 spray into both nostrils daily as needed for congestion., Disp: , Rfl:  .  simvastatin (ZOCOR) 20 MG tablet, TAKE 1 TABLET AT BEDTIME, Disp: 90 tablet, Rfl: 3 .  torsemide (DEMADEX) 100 MG tablet,  TAKE 1 TABLET DAILY, Disp: 90 tablet, Rfl: 3 .  nitroGLYCERIN (NITROSTAT) 0.4 MG SL tablet, Place 1 tablet (0.4 mg total) under the tongue every 5 (  five) minutes as needed for chest pain (Up to 3 doses)., Disp: 25 tablet, Rfl: 1      Objective:   Vitals:   05/13/18 1543  BP: 130/82  Pulse: 64  SpO2: 94%  Weight: (!) 325 lb 3.2 oz (147.5 kg)  Height: 6' (1.829 m)    Estimated body mass index is 44.11 kg/m as calculated from the following:   Height as of this encounter: 6' (1.829 m).   Weight as of this encounter: 325 lb 3.2 oz (147.5 kg).  @WEIGHTCHANGE @  [employer]   05/13/18 1543  Weight: (!) 325 lb 3.2 oz (147.5 kg)     Physical Exam  General Appearance:    Alert, cooperative, no distress, appears stated age - older , Deconditioned looking - yes , OBESE  - yes, Sitting on Wheelchair -  n  Head:    Normocephalic, without obvious abnormality, atraumatic  Eyes:    PERRL, conjunctiva/corneas clear,  Ears:    Normal TM's and external ear canals, both ears  Nose:   Nares normal, septum midline, mucosa normal, no drainage    or sinus tenderness. OXYGEN ON  - no . Patient is @ ra   Throat:   Lips, mucosa, and tongue normal; teeth and gums normal. Cyanosis on lips - no  Neck:   Supple, symmetrical, trachea midline, no adenopathy;    thyroid:  no enlargement/tenderness/nodules; no carotid   bruit or JVD  Back:     Symmetric, no curvature, ROM normal, no CVA tenderness  Lungs:     Distress - no , Wheeze no, Barrell Chest - no, Purse lip breathing - no, Crackles - no   Chest Wall:    No tenderness or deformity.    Heart:    Regular rate and rhythm, S1 and S2 normal, no rub   or gallop, Murmur - no  Breast Exam:    NOT DONE  Abdomen:     Soft, non-tender, bowel sounds active all four quadrants,    no masses, no organomegaly. Visceral obesity - yes  Genitalia:   NOT DONE  Rectal:   NOT DONE  Extremities:   Extremities - normal, Has Cane - no, Clubbing - no, Edema - no    Pulses:   2+ and symmetric all extremities  Skin:   Stigmata of Connective Tissue Disease - no  Lymph nodes:   Cervical, supraclavicular, and axillary nodes normal  Psychiatric:  Neurologic:   Pleasant - yes, Anxious - no, Flat affect - mild  CAm-ICU - neg, Alert and Oriented x 3 - yes, Moves all 4s - yes, Speech - normal, Cognition - intact           Assessment:       ICD-10-CM   1. Stage 3 severe COPD by GOLD classification ([place]) J44.9   2. History of smoking greater than 50 pack years Z87.891   3. Pleural plaque J92.9        Plan:     Patient Instructions  Stage 3 severe COPD by GOLD classification ([place])  History of smoking greater than 50 pack years  Pleural plaque    COPD stable with Anoro daily Too bad he did not tolerate the inhaler Asmanex Radiologist thought he might have early pulmonary fibrosis on CT scan but I do not think so  PLAN  -Respect deferral of flu shot but please make sure to get it with the primary care doctor -Continue Anoro daily -Use albuterol as needed -CMA to give  you refill on albuterol nebulizer as needed  Followup 9-12 months in pulmonary clinic with Dr. [name] Caller or sooner if needed     SIGNATURE    Dr. Brand Males, M.D., F.C.C.P,  Pulmonary and Critical Care Medicine Staff Physician, [place] Director - Interstitial Lung Disease  Program  Pulmonary [place] [facility] [place], [place], [address]  Pager: [phone], If no answer or between  15:00h - 7:00h: call [phone] Telephone: [phone]  4:30 PM 05/13/2018

## 2018-05-14 NOTE — Telephone Encounter (Signed)
Form has been signed, copy sent to scan.   Patient informed and requested original mailed.

## 2018-05-23 DIAGNOSIS — J449 Chronic obstructive pulmonary disease, unspecified: Secondary | ICD-10-CM | POA: Diagnosis not present

## 2018-05-29 ENCOUNTER — Other Ambulatory Visit: Payer: Self-pay | Admitting: Internal Medicine

## 2018-05-29 NOTE — Telephone Encounter (Signed)
Copied from [place] [id number]. Topic: Quick Communication - Rx Refill/Question >> May 29, 2018  4:17 PM Waylan Rocher, Lumin L wrote: Medication: oxyCODONE-acetaminophen (PERCOCET/ROXICET) 5-325 MG tablet (pt would like a call from cms once sent to pharmacy)  Has the patient contacted their pharmacy? No. (Agent: If no, request that the patient contact the pharmacy for the refill.) (Agent: If yes, when and what did the pharmacy advise?) narcotic  Preferred Pharmacy (with phone number or street name): [id number] [place], [place] - [address] [place] [address] Phone: [phone] Fax: [phone]  Agent: Please be advised that RX refills may take up to 3 business days. We ask that you follow-up with your pharmacy.

## 2018-05-30 MED ORDER — OXYCODONE-ACETAMINOPHEN 5-325 MG PO TABS: 1.0000 | ORAL_TABLET | Freq: Four times a day (QID) | ORAL | 0 refills | Status: DC | PRN

## 2018-05-30 NOTE — Telephone Encounter (Signed)
Done erx 

## 2018-05-30 NOTE — Telephone Encounter (Signed)
Checked controlled substance database, last filled 04/27/18. Last OV: 03/15/18 Next Ov: 06/19/18  Please advise about refill in Dr. Enis Slipper absence.

## 2018-06-10 ENCOUNTER — Encounter: Payer: Self-pay | Admitting: Internal Medicine

## 2018-06-10 ENCOUNTER — Ambulatory Visit (INDEPENDENT_AMBULATORY_CARE_PROVIDER_SITE_OTHER): Payer: BLUE CROSS/BLUE SHIELD | Admitting: Internal Medicine

## 2018-06-10 DIAGNOSIS — J441 Chronic obstructive pulmonary disease with (acute) exacerbation: Secondary | ICD-10-CM

## 2018-06-10 MED ORDER — PREDNISONE 20 MG PO TABS: 40.0000 mg | ORAL_TABLET | Freq: Every day | ORAL | 0 refills | Status: DC

## 2018-06-10 MED ORDER — DOXYCYCLINE HYCLATE 100 MG PO TABS
100.0000 mg | ORAL_TABLET | Freq: Two times a day (BID) | ORAL | 0 refills | Status: DC
Start: 2018-06-10 — End: 2018-06-19

## 2018-06-10 NOTE — Assessment & Plan Note (Signed)
Rx for doxycycline and prednisone. He does have mild hypoxia on walking which improves with sitting. If no improvement in 2-3 days may need ER evaluation/admission given severe lung disease at baseline.

## 2018-06-10 NOTE — Patient Instructions (Signed)
We have sent in doxycycline to take 1 pill twice a day for 1 week.  We have sent in prednisone to take 2 pills daily for 1 week.  If you are not improving in the next 2-3 days come back.   If breathing worsens go to the ER or come back to the office sooner.

## 2018-06-10 NOTE — Progress Notes (Signed)
   Subjective:    Patient ID: [name], male    DOB: 1956-01-17, 62 y.o.   MRN: [id number]  HPI The patient is a 62 YO man coming in for cough for about 1-2 weeks. He started having cough with green sputum. Denies fever but some chills. Is fatigued. Is having some sinus drainage which is mild. Overall no sinus pressure or headaches. No ear pain or pressure. Taking mucinex and otc cough medicine. This is not helping. Overall symptoms are worsening. He has more SOB since this started. Taking anoro which is not helping as much now. Using nebulizer and this is not helping much.   Review of Systems  Constitutional: Positive for activity change, appetite change, chills and fatigue. Negative for fever and unexpected weight change.  HENT: Positive for postnasal drip. Negative for congestion, ear discharge, ear pain, rhinorrhea, sinus pressure, sinus pain, sneezing, sore throat, tinnitus, trouble swallowing and voice change.   Eyes: Negative.   Respiratory: Positive for cough and shortness of breath. Negative for chest tightness and wheezing.   Cardiovascular: Negative.   Gastrointestinal: Negative.   Musculoskeletal: Positive for myalgias.  Skin: Negative.   Neurological: Negative.   Psychiatric/Behavioral: Negative.       Objective:   Physical Exam  Constitutional: He is oriented to person, place, and time. He appears well-developed and well-nourished.  HENT:  Head: Normocephalic and atraumatic.  Eyes: EOM are normal.  Neck: Normal range of motion.  Cardiovascular: Normal rate and regular rhythm.  Pulmonary/Chest: Effort normal. No respiratory distress. He has wheezes. He has no rales.  Expiratory wheezing and rhonchi bilateral with right worse than left.   Abdominal: Soft. Bowel sounds are normal. He exhibits no distension. There is no tenderness. There is no rebound.  Musculoskeletal: He exhibits no edema.  Neurological: He is alert and oriented to person, place, and time.  Coordination normal.  Skin: Skin is warm and dry.  Psychiatric: He has a normal mood and affect.   Vitals:   06/10/18 0952  BP: 128/78  Pulse: 73  Temp: 98.4 F (36.9 C)  TempSrc: Oral  SpO2: (!) 89%  Weight: (!) 330 lb (149.7 kg)  Height: 6' (1.829 m)      Assessment & Plan:  Visit time 25 minutes: greater than 50% of that time was spent in face to face counseling and coordination of care with the patient: counseled about smoking cessation (smoking currently) as well as danger signs to seek emergent care and course expected as well as symptom management given several other co-morbid conditions.

## 2018-06-13 ENCOUNTER — Telehealth: Payer: Self-pay | Admitting: Hematology and Oncology

## 2018-06-13 NOTE — Telephone Encounter (Signed)
NG out 10/30 thru 11/29 - moved 11/8 lab/fu to 12/12. Spoke with patient.

## 2018-06-19 ENCOUNTER — Encounter: Payer: Self-pay | Admitting: Internal Medicine

## 2018-06-19 ENCOUNTER — Ambulatory Visit: Payer: BLUE CROSS/BLUE SHIELD | Admitting: Internal Medicine

## 2018-06-19 ENCOUNTER — Other Ambulatory Visit (INDEPENDENT_AMBULATORY_CARE_PROVIDER_SITE_OTHER): Payer: BLUE CROSS/BLUE SHIELD

## 2018-06-19 VITALS — BP 124/68 | HR 71 | Temp 98.8°F | Ht 72.0 in | Wt 319.0 lb

## 2018-06-19 DIAGNOSIS — I2583 Coronary atherosclerosis due to lipid rich plaque: Secondary | ICD-10-CM

## 2018-06-19 DIAGNOSIS — E1151 Type 2 diabetes mellitus with diabetic peripheral angiopathy without gangrene: Secondary | ICD-10-CM

## 2018-06-19 DIAGNOSIS — L97929 Non-pressure chronic ulcer of unspecified part of left lower leg with unspecified severity: Secondary | ICD-10-CM

## 2018-06-19 DIAGNOSIS — I5032 Chronic diastolic (congestive) heart failure: Secondary | ICD-10-CM

## 2018-06-19 DIAGNOSIS — I2510 Atherosclerotic heart disease of native coronary artery without angina pectoris: Secondary | ICD-10-CM

## 2018-06-19 DIAGNOSIS — M79605 Pain in left leg: Secondary | ICD-10-CM

## 2018-06-19 DIAGNOSIS — L97919 Non-pressure chronic ulcer of unspecified part of right lower leg with unspecified severity: Secondary | ICD-10-CM

## 2018-06-19 DIAGNOSIS — I87313 Chronic venous hypertension (idiopathic) with ulcer of bilateral lower extremity: Secondary | ICD-10-CM

## 2018-06-19 DIAGNOSIS — I10 Essential (primary) hypertension: Secondary | ICD-10-CM

## 2018-06-19 DIAGNOSIS — M545 Low back pain: Secondary | ICD-10-CM

## 2018-06-19 DIAGNOSIS — Z23 Encounter for immunization: Secondary | ICD-10-CM

## 2018-06-19 DIAGNOSIS — M79604 Pain in right leg: Secondary | ICD-10-CM

## 2018-06-19 DIAGNOSIS — J069 Acute upper respiratory infection, unspecified: Secondary | ICD-10-CM

## 2018-06-19 DIAGNOSIS — Z9861 Coronary angioplasty status: Secondary | ICD-10-CM

## 2018-06-19 LAB — BASIC METABOLIC PANEL
BUN: 15 mg/dL (ref 6–23)
CHLORIDE: 94 meq/L — AB (ref 96–112)
CO2: 39 meq/L — AB (ref 19–32)
CREATININE: 0.86 mg/dL (ref 0.40–1.50)
Calcium: 9.8 mg/dL (ref 8.4–10.5)
GFR: 95.06 mL/min (ref >60.00)
GLUCOSE: 102 mg/dL — AB (ref 70–99)
Potassium: 4.6 mEq/L (ref 3.5–5.1)
Sodium: 139 mEq/L (ref 135–145)

## 2018-06-19 LAB — CBC WITH DIFFERENTIAL/PLATELET
BASOS PCT: 0.4 % (ref 0.0–3.0)
Basophils Absolute: 0.1 10*3/uL (ref 0.0–0.1)
Eosinophils Absolute: 0.2 10*3/uL (ref 0.0–0.7)
Eosinophils Relative: 1.4 % (ref 0.0–5.0)
HCT: 48.3 % (ref 39.0–52.0)
Hemoglobin: 16.2 g/dL (ref 13.0–17.0)
LYMPHS ABS: 1 10*3/uL (ref 0.7–4.0)
Lymphocytes Relative: 6.8 % — ABNORMAL LOW (ref 12.0–46.0)
MCHC: 33.5 g/dL (ref 30.0–36.0)
MCV: 98.1 fl (ref 78.0–100.0)
MONO ABS: 1.5 10*3/uL — AB (ref 0.1–1.0)
Monocytes Relative: 10 % (ref 3.0–12.0)
NEUTROS ABS: 12.2 10*3/uL — AB (ref 1.4–7.7)
Neutrophils Relative %: 81.4 % — ABNORMAL HIGH (ref 43.0–77.0)
PLATELETS: 155 10*3/uL (ref 150.0–400.0)
RBC: 4.93 Mil/uL (ref 4.22–5.81)
RDW: 13.8 % (ref 11.5–15.5)
WBC: 14.9 10*3/uL — ABNORMAL HIGH (ref 4.0–10.5)

## 2018-06-19 LAB — HEPATIC FUNCTION PANEL
ALK PHOS: 72 U/L (ref 39–117)
ALT: 26 U/L (ref 0–53)
AST: 19 U/L (ref 0–37)
Albumin: 4.4 g/dL (ref 3.5–5.2)
BILIRUBIN DIRECT: 0.2 mg/dL (ref 0.0–0.3)
BILIRUBIN TOTAL: 0.8 mg/dL (ref 0.2–1.2)
TOTAL PROTEIN: 7.2 g/dL (ref 6.0–8.3)

## 2018-06-19 LAB — TSH: TSH: 1.25 u[IU]/mL (ref 0.35–4.50)

## 2018-06-19 LAB — HEMOGLOBIN A1C: HEMOGLOBIN A1C: 6.2 % (ref 4.6–6.5)

## 2018-06-19 MED ORDER — DOXYCYCLINE HYCLATE 100 MG PO TABS: 100.0000 mg | ORAL_TABLET | Freq: Two times a day (BID) | ORAL | 0 refills | Status: DC

## 2018-06-19 MED ORDER — PREDNISONE 20 MG PO TABS: 40.0000 mg | ORAL_TABLET | Freq: Every day | ORAL | 0 refills | Status: DC

## 2018-06-19 MED ORDER — OXYCODONE-ACETAMINOPHEN 5-325 MG PO TABS: 1.0000 | ORAL_TABLET | Freq: Four times a day (QID) | ORAL | 0 refills | Status: DC | PRN

## 2018-06-19 NOTE — Assessment & Plan Note (Signed)
Needs to stop smoking.

## 2018-06-19 NOTE — Addendum Note (Signed)
Addended by: Karren Cobble on: 06/19/2018 03:37 PM   Modules accepted: Orders

## 2018-06-19 NOTE — Assessment & Plan Note (Signed)
Wt Readings from Last 3 Encounters:  06/19/18 (!) 319 lb (144.7 kg)  06/10/18 (!) 330 lb (149.7 kg)  05/13/18 (!) 325 lb 3.2 oz (147.5 kg)

## 2018-06-19 NOTE — Assessment & Plan Note (Signed)
Doxy x 10 d 

## 2018-06-19 NOTE — Assessment & Plan Note (Signed)
Torsemide, Hydralazine 

## 2018-06-19 NOTE — Assessment & Plan Note (Signed)
  On diet  

## 2018-06-19 NOTE — Assessment & Plan Note (Signed)
Compensated now 

## 2018-06-19 NOTE — Progress Notes (Signed)
Subjective:  Patient ID: [name], male    DOB: 1955-12-10  Age: 62 y.o. MRN: [id number]  CC: No chief complaint on file.   HPI Zaquan Duffner presents for LBP, COPD, CHF f/u  Outpatient Medications Prior to Visit  Medication Sig Dispense Refill  . acetaminophen (TYLENOL) 325 MG tablet Take 2 tablets (650 mg total) by mouth every 6 (six) hours as needed for mild pain or headache.    . albuterol (PROVENTIL) (2.5 MG/3ML) 0.083% nebulizer solution Take 3 mLs (2.5 mg total) by nebulization daily as needed for wheezing or shortness of breath. 75 mL 11  . ANORO ELLIPTA 62.5-25 MCG/INH AEPB Inhale 1 puff into the lungs daily as needed. For wheezing & SOB 180 each 3  . aspirin EC 81 MG tablet Take 81 mg by mouth every evening.     . clonazePAM (KLONOPIN) 0.5 MG tablet TAKE 1 TABLET TWICE A DAY AS NEEDED FOR ANXIETY OR AT BEDTIME FOR INSOMNIA 60 tablet 3  . clopidogrel (PLAVIX) 75 MG tablet TAKE 1 TABLET DAILY WITH   BREAKFAST 90 tablet 3  . doxycycline (VIBRA-TABS) 100 MG tablet Take 1 tablet (100 mg total) by mouth 2 (two) times daily. 14 tablet 0  . KLOR-CON M20 20 MEQ tablet TAKE 1 TABLET DAILY 90 tablet 1  . metoprolol succinate (TOPROL-XL) 50 MG 24 hr tablet TAKE 2 TABLETS (100MG )&nbsp;&nbsp;&nbsp;&nbsp; DAILY WITH OR IMMEDIATELY&nbsp;&nbsp;FOLLOWING A MEAL 180 tablet 3  . oxyCODONE-acetaminophen (PERCOCET/ROXICET) 5-325 MG tablet Take 1 tablet by mouth every 6 (six) hours as needed for severe pain. Please fill on or after 05/30/18 120 tablet 0  . oxymetazoline (AFRIN) 0.05 % nasal spray Place 1 spray into both nostrils daily as needed for congestion.    . predniSONE (DELTASONE) 20 MG tablet Take 2 tablets (40 mg total) by mouth daily with breakfast. 14 tablet 0  . simvastatin (ZOCOR) 20 MG tablet TAKE 1 TABLET AT BEDTIME 90 tablet 3  . torsemide (DEMADEX) 100 MG tablet TAKE 1 TABLET DAILY 90 tablet 3  . nitroGLYCERIN (NITROSTAT) 0.4 MG SL tablet Place 1 tablet (0.4 mg total)  under the tongue every 5 (five) minutes as needed for chest pain (Up to 3 doses). 25 tablet 1   No facility-administered medications prior to visit.     ROS: Review of Systems  Constitutional: Positive for fatigue. Negative for appetite change and unexpected weight change.  HENT: Negative for congestion, nosebleeds, sneezing, sore throat and trouble swallowing.   Eyes: Negative for itching and visual disturbance.  Respiratory: Positive for cough and wheezing.   Cardiovascular: Positive for leg swelling. Negative for chest pain and palpitations.  Gastrointestinal: Negative for abdominal distention, blood in stool, diarrhea and nausea.  Genitourinary: Negative for frequency and hematuria.  Musculoskeletal: Negative for back pain, gait problem, joint swelling and neck pain.  Skin: Negative for rash.  Neurological: Negative for dizziness, tremors, speech difficulty and weakness.  Psychiatric/Behavioral: Negative for agitation, dysphoric mood and sleep disturbance. The patient is not nervous/anxious.     Objective:  BP 124/68 (BP Location: Left Arm, Patient Position: Sitting, Cuff Size: Large)   Pulse 71   Temp 98.8 F (37.1 C) (Oral)   Ht 6' (1.829 m)   Wt (!) 319 lb (144.7 kg)   SpO2 92%   BMI 43.26 kg/m   BP Readings from Last 3 Encounters:  06/19/18 124/68  06/10/18 128/78  05/13/18 130/82    Wt Readings from Last 3 Encounters:  06/19/18 (!) 319 lb (  144.7 kg)  06/10/18 (!) 330 lb (149.7 kg)  05/13/18 (!) 325 lb 3.2 oz (147.5 kg)    Physical Exam  Constitutional: He is oriented to person, place, and time. He appears well-developed. No distress.  NAD  HENT:  Mouth/Throat: Oropharynx is clear and moist.  Eyes: Pupils are equal, round, and reactive to light. Conjunctivae are normal.  Neck: Normal range of motion. No JVD present. No thyromegaly present.  Cardiovascular: Normal rate, regular rhythm, normal heart sounds and intact distal pulses. Exam reveals no gallop and no  friction rub.  No murmur heard. Pulmonary/Chest: Effort normal and breath sounds normal. No respiratory distress. He has no wheezes. He has no rales. He exhibits no tenderness.  Abdominal: Soft. Bowel sounds are normal. He exhibits no distension and no mass. There is no tenderness. There is no rebound and no guarding.  Musculoskeletal: Normal range of motion. He exhibits edema and tenderness.  Lymphadenopathy:    He has no cervical adenopathy.  Neurological: He is alert and oriented to person, place, and time. He has normal reflexes. No cranial nerve deficit. He exhibits normal muscle tone. He displays a negative Romberg sign. Coordination and gait normal.  Skin: Skin is warm and dry. No rash noted.  Psychiatric: He has a normal mood and affect. His behavior is normal. Judgment and thought content normal.  edema 1+  Lab Results  Component Value Date   WBC 9.3 01/11/2018   HGB 14.0 01/11/2018   HCT 41.9 01/11/2018   PLT 134 (L) 01/11/2018   GLUCOSE 93 01/11/2018   CHOL 108 01/01/2018   TRIG 98 01/01/2018   HDL 35 (L) 01/01/2018   LDLCALC 53 01/01/2018   ALT 22 01/11/2018   AST 19 01/11/2018   NA 141 01/11/2018   K 4.4 01/11/2018   CL 99 01/11/2018   CREATININE 1.03 01/11/2018   BUN 18 01/11/2018   CO2 35 (H) 01/11/2018   TSH 1.60 09/11/2017   PSA 0.23 09/15/2011   INR 1.0 11/28/2017   HGBA1C 6.0 09/11/2017   MICROALBUR 0.7 03/29/2016    Ct Chest Wo Contrast  Result Date: 05/03/2018 CLINICAL DATA:  COPD/emphysema, asbestos exposure, annual follow-up EXAM: CT CHEST WITHOUT CONTRAST TECHNIQUE: Multidetector CT imaging of the chest was performed following the standard protocol without IV contrast. COMPARISON:  CT chest dated 04/19/2017 FINDINGS: Cardiovascular: The heart is normal in size. No pericardial effusion. No evidence of thoracic aortic aneurysm. Atherosclerotic calcifications of the aortic arch. Three vessel coronary atherosclerosis. Postsurgical changes related to prior  CABG. Mediastinum/Nodes: No suspicious mediastinal lymphadenopathy. Visualized thyroid is unremarkable. Lungs/Pleura: Calcified and noncalcified pleural plaques. Associated subpleural reticulation/ground-glass with subpleural lines and scattered areas of parenchymal banding. Mild centrilobular and paraseptal emphysematous changes, upper lobe predominant. 3 mm subpleural nodule in the lateral right upper lobe (series 3/image 61), unchanged/benign. No new/suspicious pulmonary nodules. No pleural effusion or pneumothorax. Upper Abdomen: Visualized upper abdomen is notable for cholelithiasis and mild hepatic steatosis. Spleen is normal in size. Musculoskeletal: Gynecomastia. Degenerative changes of the visualized thoracolumbar spine. 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 No suspicious lymphadenopathy in this patient with history of lymphoma. Spleen is normal in size. Aortic Atherosclerosis (ICD10-I70.0) and Emphysema (ICD10-J43.9). Electronically Signed   By: Julian Hy M.D.   On: 05/03/2018 14:18    Assessment & Plan:   There are no diagnoses linked to this encounter.   No orders of the defined types were placed in this encounter.  Follow-up: No follow-ups on file.  Walker Kehr, MD

## 2018-06-19 NOTE — Assessment & Plan Note (Signed)
Percocet prn Not to take w/Clonazepam  Potential benefits of a long term opioids use as well as potential risks (i.e. addiction risk, apnea etc) and complications (i.e. Somnolence, constipation and others) were explained to the patient and were aknowledged. 

## 2018-06-21 ENCOUNTER — Other Ambulatory Visit: Payer: BLUE CROSS/BLUE SHIELD

## 2018-06-21 ENCOUNTER — Ambulatory Visit: Payer: BLUE CROSS/BLUE SHIELD | Admitting: Hematology and Oncology

## 2018-06-23 DIAGNOSIS — J449 Chronic obstructive pulmonary disease, unspecified: Secondary | ICD-10-CM | POA: Diagnosis not present

## 2018-07-23 DIAGNOSIS — J449 Chronic obstructive pulmonary disease, unspecified: Secondary | ICD-10-CM | POA: Diagnosis not present

## 2018-07-24 ENCOUNTER — Other Ambulatory Visit: Payer: Self-pay

## 2018-07-24 DIAGNOSIS — C8228 Follicular lymphoma grade III, unspecified, lymph nodes of multiple sites: Secondary | ICD-10-CM

## 2018-07-25 ENCOUNTER — Inpatient Hospital Stay (HOSPITAL_BASED_OUTPATIENT_CLINIC_OR_DEPARTMENT_OTHER): Payer: BLUE CROSS/BLUE SHIELD | Admitting: Hematology and Oncology

## 2018-07-25 ENCOUNTER — Telehealth: Payer: Self-pay | Admitting: Hematology and Oncology

## 2018-07-25 ENCOUNTER — Inpatient Hospital Stay: Payer: BLUE CROSS/BLUE SHIELD | Attending: Hematology and Oncology

## 2018-07-25 DIAGNOSIS — J449 Chronic obstructive pulmonary disease, unspecified: Secondary | ICD-10-CM | POA: Diagnosis not present

## 2018-07-25 DIAGNOSIS — C8228 Follicular lymphoma grade III, unspecified, lymph nodes of multiple sites: Secondary | ICD-10-CM

## 2018-07-25 DIAGNOSIS — D696 Thrombocytopenia, unspecified: Secondary | ICD-10-CM | POA: Diagnosis not present

## 2018-07-25 DIAGNOSIS — Z720 Tobacco use: Secondary | ICD-10-CM | POA: Insufficient documentation

## 2018-07-25 LAB — CBC WITH DIFFERENTIAL (CANCER CENTER ONLY)
Abs Immature Granulocytes: 0.04 10*3/uL (ref 0.00–0.07)
BASOS ABS: 0.1 10*3/uL (ref 0.0–0.1)
BASOS PCT: 1 %
EOS PCT: 4 %
Eosinophils Absolute: 0.4 10*3/uL (ref 0.0–0.5)
HEMATOCRIT: 44.9 % (ref 39.0–52.0)
Hemoglobin: 14.3 g/dL (ref 13.0–17.0)
Immature Granulocytes: 0 %
Lymphocytes Relative: 11 %
Lymphs Abs: 1.1 10*3/uL (ref 0.7–4.0)
MCH: 32.5 pg (ref 26.0–34.0)
MCHC: 31.8 g/dL (ref 30.0–36.0)
MCV: 102 fL — AB (ref 80.0–100.0)
MONO ABS: 1.1 10*3/uL — AB (ref 0.1–1.0)
Monocytes Relative: 11 %
Neutro Abs: 7.5 10*3/uL (ref 1.7–7.7)
Neutrophils Relative %: 73 %
Platelet Count: 143 10*3/uL — ABNORMAL LOW (ref 150–400)
RBC: 4.4 MIL/uL (ref 4.22–5.81)
RDW: 13.6 % (ref 11.5–15.5)
WBC Count: 10.1 10*3/uL (ref 4.0–10.5)
nRBC: 0 % (ref 0.0–0.2)

## 2018-07-25 LAB — CMP (CANCER CENTER ONLY)
ALBUMIN: 4 g/dL (ref 3.5–5.0)
ALT: 15 U/L (ref 0–44)
AST: 16 U/L (ref 15–41)
Alkaline Phosphatase: 86 U/L (ref 38–126)
Anion gap: 9 (ref 5–15)
BILIRUBIN TOTAL: 0.3 mg/dL (ref 0.3–1.2)
BUN: 14 mg/dL (ref 8–23)
CO2: 37 mmol/L — AB (ref 22–32)
Calcium: 9.2 mg/dL (ref 8.9–10.3)
Chloride: 97 mmol/L — ABNORMAL LOW (ref 98–111)
Creatinine: 0.87 mg/dL (ref 0.61–1.24)
GFR, Est AFR Am: >60 mL/min (ref >60)
GLUCOSE: 128 mg/dL — AB (ref 70–99)
POTASSIUM: 4.2 mmol/L (ref 3.5–5.1)
Sodium: 143 mmol/L (ref 135–145)
Total Protein: 7 g/dL (ref 6.5–8.1)

## 2018-07-25 LAB — LACTATE DEHYDROGENASE: LDH: 150 U/L (ref 98–192)

## 2018-07-25 NOTE — Telephone Encounter (Signed)
Gave avs and calendar ° °

## 2018-07-26 ENCOUNTER — Encounter: Payer: Self-pay | Admitting: Hematology and Oncology

## 2018-07-26 NOTE — Assessment & Plan Note (Signed)
I spent some time counseling the patient the importance of tobacco cessation. He is currently trying to quit on his own.

## 2018-07-26 NOTE — Progress Notes (Signed)
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 Clinically, he has no signs or symptoms to suggest cancer recurrence I will see him back in 6 months with repeat history, physical examination and blood work I do not recommend routine surveillance imaging study  COPD mixed type [facility]) He had recent CT imaging of the chest He follows closely with pulmonary clinic and was treated with multiple courses of oral antibiotics therapy, along with oxygen, nebulizer and prednisone therapy He had wheezes on exam I recommend close follow-up with pulmonary clinic for further management I advised him to quit smoking.  Thrombocytopenia ([place]) This is likely due to fatty liver disease. The patient denies recent history of bleeding such as epistaxis, hematuria or hematochezia. He is asymptomatic from the low platelet count. I will observe for now.  Tobacco abuse I spent some time counseling the patient the importance of tobacco cessation. He is currently trying to quit on his own.   No orders of the defined types were placed in this encounter.   INTERVAL HISTORY: Please see below for problem oriented charting. He returns for further follow-up He had recurrent COPD exacerbation and had required multiple courses of antibiotics recently No new lymphadenopathy His appetite is stable.  He has no significant weight loss or abnormal night sweats He continues to smoke  periodically Denies recent skin ulcers on his legs  SUMMARY OF ONCOLOGIC HISTORY: Oncology History   FLIPI score of 2; stage 3 and areas of involvement >4     Follicular lymphoma ([place])   06/24/2009 Imaging    PET CT scan showed two small FDG positive left neck nodes    12/11/2014 Surgery    He underwent excisional lymph node biopsy of the left supraclavicular lymph node/neck region    12/11/2014 Pathology Results    Accession: [id number] biopsy show follicular lymphoma    0/17/5102 Imaging    ECHO showed LVH but preserved EF    12/25/2014 Imaging    PET scan showed disease above and below diaphragm    12/28/2014 Procedure    He has port placement    12/31/2014 - 01/01/2015 Chemotherapy    He received 1 cycle of bendamustine with rituximab, discontinued due to suspicion of allergic reaction to bendamustine    01/12/2015 - 01/19/2015 Hospital Admission    He was admitted to the hospital with suspicious allergic reaction, significant bilateral lower extremity edema with cellulitis, pneumonia and mild fluid overload    03/18/2015 - 04/08/2015 Chemotherapy    Treatment is switched to rituximab, Cytoxan, vincristine and prednisone    04/29/2015 Imaging     PET CT scan showed near complete response to treatment.    04/30/2015 - 01/12/2017 Chemotherapy    He received maintenance Rituxan    11/03/2016 PET scan    No metabolic evidence of recurrent lymphoma. Mild patchy marrow hypermetabolism throughout the axial skeleton, unchanged, probably due to a mildly reactive marrow state. Additional findings include aortic atherosclerosis, three-vessel coronary atherosclerosis status post CABG, mild cardiomegaly, chronic main pulmonary artery dilatation, bilateral calcified pleural plaques  compatible with asbestos related pleural disease without pleural effusions, diffuse hepatic steatosis, cholelithiasis and nonobstructing bilateral nephrolithiasis.    04/19/2017 Imaging    1. No evidence for  lymphadenopathy in the chest, abdomen, or pelvis. 2. Aortic Atherosclerois (ICD10-170.0) 3. 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 4. Bilateral nonobstructing nephrolithiasis. 5. Cholelithiasis. 6. Hepatic steatosis    05/18/2017 Procedure    Successful removal of implanted Port-A-Cath     REVIEW OF SYSTEMS:   Constitutional: Denies fevers, chills or abnormal weight loss Eyes: Denies blurriness of vision Ears, nose, mouth, throat, and face: Denies mucositis or sore throat Gastrointestinal:  Denies nausea, heartburn or change in bowel habits Skin: Denies abnormal skin rashes Lymphatics: Denies new lymphadenopathy or easy bruising Neurological:Denies numbness, tingling or new weaknesses Behavioral/Psych: Mood is stable, no new changes  All other systems were reviewed with the patient and are negative.  I have reviewed the past medical history, past surgical history, social history and family history with the patient and they are unchanged from previous note.  ALLERGIES:  is allergic to bendamustine hcl; heparin; and tape.  MEDICATIONS:  Current Outpatient Medications  Medication Sig Dispense Refill  . acetaminophen (TYLENOL) 325 MG tablet Take 2 tablets (650 mg total) by mouth every 6 (six) hours as needed for mild pain or headache.    . albuterol (PROVENTIL) (2.5 MG/3ML) 0.083% nebulizer solution Take 3 mLs (2.5 mg total) by nebulization daily as needed for wheezing or shortness of breath. 75 mL 11  . ANORO ELLIPTA 62.5-25 MCG/INH AEPB Inhale 1 puff into the lungs daily as needed. For wheezing & SOB 180 each 3  . aspirin EC 81 MG tablet Take 81 mg by mouth every evening.     . clonazePAM (KLONOPIN) 0.5 MG tablet TAKE 1 TABLET TWICE A DAY AS NEEDED FOR ANXIETY OR AT BEDTIME FOR INSOMNIA 60 tablet 3  . clopidogrel (PLAVIX) 75 MG tablet TAKE 1 TABLET DAILY WITH   BREAKFAST 90 tablet 3  . doxycycline (VIBRA-TABS) 100 MG tablet Take 1 tablet (100 mg  total) by mouth 2 (two) times daily. 14 tablet 0  . KLOR-CON M20 20 MEQ tablet TAKE 1 TABLET DAILY 90 tablet 1  . metoprolol succinate (TOPROL-XL) 50 MG 24 hr tablet TAKE 2 TABLETS (100MG )&nbsp;&nbsp;&nbsp;&nbsp; DAILY WITH OR IMMEDIATELY&nbsp;&nbsp;FOLLOWING A MEAL 180 tablet 3  . nitroGLYCERIN (NITROSTAT) 0.4 MG SL tablet Place 1 tablet (0.4 mg total) under the tongue every 5 (five) minutes as needed for chest pain (Up to 3 doses). 25 tablet 1  . oxyCODONE-acetaminophen (PERCOCET/ROXICET) 5-325 MG tablet Take 1 tablet by mouth every 6 (six) hours as needed for severe pain. Please fill on or after 07/30/18 120 tablet 0  . oxymetazoline (AFRIN) 0.05 % nasal spray Place 1 spray into both nostrils daily as needed for congestion.    . predniSONE (DELTASONE) 20 MG tablet Take 2 tablets (40 mg total) by mouth daily with breakfast. 14 tablet 0  . simvastatin (ZOCOR) 20 MG tablet TAKE 1 TABLET AT BEDTIME 90 tablet 3  . torsemide (DEMADEX) 100 MG tablet TAKE 1 TABLET DAILY 90 tablet 3   No current facility-administered medications for this visit.     PHYSICAL EXAMINATION: ECOG PERFORMANCE STATUS: 1 - Symptomatic but completely ambulatory  Vitals:   07/25/18 1510  BP: (!) 129/49  Pulse: 71  Resp: 18  Temp: 98.5 F (36.9 C)  SpO2: 93%   Filed Weights   07/25/18 1510  Weight: 239 lb 9.6 oz (108.7 kg)  GENERAL:alert, no distress and comfortable.  He is morbidly obese SKIN: skin color, texture, turgor are normal, no rashes or significant lesions EYES: normal, Conjunctiva are pink and non-injected, sclera clear OROPHARYNX:no exudate, no erythema and lips, buccal mucosa, and tongue normal  NECK: supple, thyroid normal size, non-tender, without nodularity LYMPH:  no palpable lymphadenopathy in the cervical, axillary or inguinal LUNGS: Diffuse wheezes throughout with mild increased breathing effort HEART: regular rate & rhythm and no murmurs with moderate bilateral lower extremity  edema ABDOMEN:abdomen soft, non-tender and normal bowel sounds Musculoskeletal:no cyanosis of digits and no clubbing  NEURO: alert & oriented x 3 with fluent speech, no focal motor/sensory deficits  LABORATORY DATA:  I have reviewed the data as listed    Component Value Date/Time   NA 143 07/25/2018 1442   NA 141 01/01/2018 0932   NA 141 04/19/2017 0822   K 4.2 07/25/2018 1442   K 4.1 04/19/2017 0822   CL 97 (L) 07/25/2018 1442   CO2 37 (H) 07/25/2018 1442   CO2 33 (H) 04/19/2017 0822   GLUCOSE 128 (H) 07/25/2018 1442   GLUCOSE 113 04/19/2017 0822   BUN 14 07/25/2018 1442   BUN 14 01/01/2018 0932   BUN 8.7 04/19/2017 0822   CREATININE 0.87 07/25/2018 1442   CREATININE 0.7 04/19/2017 0822   CALCIUM 9.2 07/25/2018 1442   CALCIUM 9.5 04/19/2017 0822   PROT 7.0 07/25/2018 1442   PROT 7.1 01/01/2018 0932   PROT 6.8 04/19/2017 0822   ALBUMIN 4.0 07/25/2018 1442   ALBUMIN 4.6 01/01/2018 0932   ALBUMIN 3.8 04/19/2017 0822   AST 16 07/25/2018 1442   AST 18 04/19/2017 0822   ALT 15 07/25/2018 1442   ALT 21 04/19/2017 0822   ALKPHOS 86 07/25/2018 1442   ALKPHOS 82 04/19/2017 0822   BILITOT 0.3 07/25/2018 1442   BILITOT 0.53 04/19/2017 0822   GFRNONAA >60 07/25/2018 1442   GFRAA >60 07/25/2018 1442    No results found for: SPEP, UPEP  Lab Results  Component Value Date   WBC 10.1 07/25/2018   NEUTROABS 7.5 07/25/2018   HGB 14.3 07/25/2018   HCT 44.9 07/25/2018   MCV 102.0 (H) 07/25/2018   PLT 143 (L) 07/25/2018      Chemistry      Component Value Date/Time   NA 143 07/25/2018 1442   NA 141 01/01/2018 0932   NA 141 04/19/2017 0822   K 4.2 07/25/2018 1442   K 4.1 04/19/2017 0822   CL 97 (L) 07/25/2018 1442   CO2 37 (H) 07/25/2018 1442   CO2 33 (H) 04/19/2017 0822   BUN 14 07/25/2018 1442   BUN 14 01/01/2018 0932   BUN 8.7 04/19/2017 0822   CREATININE 0.87 07/25/2018 1442   CREATININE 0.7 04/19/2017 0822      Component Value Date/Time   CALCIUM 9.2 07/25/2018  1442   CALCIUM 9.5 04/19/2017 0822   ALKPHOS 86 07/25/2018 1442   ALKPHOS 82 04/19/2017 0822   AST 16 07/25/2018 1442   AST 18 04/19/2017 0822   ALT 15 07/25/2018 1442   ALT 21 04/19/2017 0822   BILITOT 0.3 07/25/2018 1442   BILITOT 0.53 04/19/2017 0822     All questions were answered. The patient knows to call the clinic with any problems, questions or concerns. No barriers to learning was detected.  I spent 15 minutes counseling the patient face to face. The total time spent in the appointment was 20 minutes and more than 50% was on counseling  and review of test results  Scott Lark, MD 07/26/2018 10:04 AM

## 2018-07-26 NOTE — Assessment & Plan Note (Signed)
This is likely due to fatty liver disease. The patient denies recent history of bleeding such as epistaxis, hematuria or hematochezia. He is asymptomatic from the low platelet count. I will observe for now.

## 2018-07-26 NOTE — Assessment & Plan Note (Signed)
Last CT scan showed no signs of disease. Clinically, he has no signs or symptoms to suggest cancer recurrence I will see him back in 6 months with repeat history, physical examination and blood work I do not recommend routine surveillance imaging study

## 2018-07-26 NOTE — Assessment & Plan Note (Signed)
He had recent CT imaging of the chest He follows closely with pulmonary clinic and was treated with multiple courses of oral antibiotics therapy, along with oxygen, nebulizer and prednisone therapy He had wheezes on exam I recommend close follow-up with pulmonary clinic for further management I advised him to quit smoking.

## 2018-08-20 ENCOUNTER — Other Ambulatory Visit: Payer: Self-pay | Admitting: Internal Medicine

## 2018-08-23 DIAGNOSIS — J449 Chronic obstructive pulmonary disease, unspecified: Secondary | ICD-10-CM | POA: Diagnosis not present

## 2018-08-27 MED ORDER — CLONAZEPAM 0.5 MG PO TABS: ORAL_TABLET | ORAL | 3 refills | Status: DC

## 2018-08-27 NOTE — Addendum Note (Signed)
Addended by: Karren Cobble on: 08/27/2018 08:49 AM   Modules accepted: Orders

## 2018-09-02 ENCOUNTER — Telehealth: Payer: Self-pay | Admitting: Internal Medicine

## 2018-09-02 NOTE — Telephone Encounter (Signed)
Medication not delegated to NT to refill  

## 2018-09-02 NOTE — Telephone Encounter (Signed)
Copied from [place] [id number]. Topic: Quick Communication - Rx Refill/Question >> Sep 02, 2018  3:18 PM Yvette Rack wrote: Medication: oxyCODONE-acetaminophen (PERCOCET/ROXICET) 5-325 MG tablet  Has the patient contacted their pharmacy? Yes.  Transfer pt pharmacy (Agent: If no, request that the patient contact the pharmacy for the refill.) (Agent: If yes, when and what did the pharmacy advise?)  Preferred Pharmacy (with phone number or street name):     [id number] - [place], [place] - [address] [phone] (Phone) [phone] (Fax)    Agent: Please be advised that RX refills may take up to 3 business days. We ask that you follow-up with your pharmacy.

## 2018-09-02 NOTE — Telephone Encounter (Signed)
Med requested not delegated to NT to refill

## 2018-09-02 NOTE — Telephone Encounter (Signed)
Copied from [place] [id number]. Topic: Quick Communication - Rx Refill/Question >> Sep 02, 2018  3:33 PM Blase Mess A wrote: Medication: oxyCODONE-acetaminophen (PERCOCET/ROXICET) 5-325 MG tablet [[id number]]   Has the patient contacted their pharmacy? Yes  (Agent: If no, request that the patient contact the pharmacy for the refill.) (Agent: If yes, when and what did the pharmacy advise?)  Preferred Pharmacy (with phone number or street name): [id number] - [place], [place] - [address] [phone] (Phone) [phone] (Fax)    Agent: Please be advised that RX refills may take up to 3 business days. We ask that you follow-up with your pharmacy.

## 2018-09-04 NOTE — Telephone Encounter (Signed)
Please advise about refill

## 2018-09-05 MED ORDER — OXYCODONE-ACETAMINOPHEN 5-325 MG PO TABS: 1.0000 | ORAL_TABLET | Freq: Four times a day (QID) | ORAL | 0 refills | Status: DC | PRN

## 2018-09-05 NOTE — Addendum Note (Signed)
Addended by: [name] on: 09/05/2018 12:21 AM   Modules accepted: Orders

## 2018-09-05 NOTE — Telephone Encounter (Signed)
Ok Thx 

## 2018-09-16 ENCOUNTER — Ambulatory Visit: Payer: BLUE CROSS/BLUE SHIELD | Admitting: Internal Medicine

## 2018-09-18 ENCOUNTER — Encounter: Payer: Self-pay | Admitting: Internal Medicine

## 2018-09-18 ENCOUNTER — Ambulatory Visit: Payer: BLUE CROSS/BLUE SHIELD | Admitting: Internal Medicine

## 2018-09-18 VITALS — BP 132/72 | HR 69 | Temp 98.5°F | Ht 72.0 in | Wt 336.0 lb

## 2018-09-18 DIAGNOSIS — M545 Low back pain: Secondary | ICD-10-CM

## 2018-09-18 DIAGNOSIS — Z0000 Encounter for general adult medical examination without abnormal findings: Secondary | ICD-10-CM | POA: Diagnosis not present

## 2018-09-18 DIAGNOSIS — I10 Essential (primary) hypertension: Secondary | ICD-10-CM | POA: Diagnosis not present

## 2018-09-18 DIAGNOSIS — I5032 Chronic diastolic (congestive) heart failure: Secondary | ICD-10-CM

## 2018-09-18 DIAGNOSIS — J449 Chronic obstructive pulmonary disease, unspecified: Secondary | ICD-10-CM | POA: Diagnosis not present

## 2018-09-18 DIAGNOSIS — M79604 Pain in right leg: Secondary | ICD-10-CM

## 2018-09-18 DIAGNOSIS — M79605 Pain in left leg: Secondary | ICD-10-CM

## 2018-09-18 DIAGNOSIS — E1151 Type 2 diabetes mellitus with diabetic peripheral angiopathy without gangrene: Secondary | ICD-10-CM

## 2018-09-18 MED ORDER — OXYCODONE-ACETAMINOPHEN 5-325 MG PO TABS: 1.0000 | ORAL_TABLET | Freq: Four times a day (QID) | ORAL | 0 refills | Status: DC | PRN

## 2018-09-18 NOTE — Assessment & Plan Note (Signed)
Labs

## 2018-09-18 NOTE — Assessment & Plan Note (Signed)
On Torsemide, Hydralazine Refusing CPAP NAS diet

## 2018-09-18 NOTE — Assessment & Plan Note (Signed)
Percocet prn Not to take w/Clonazepam  Potential benefits of a long term opioids use as well as potential risks (i.e. addiction risk, apnea etc) and complications (i.e. Somnolence, constipation and others) were explained to the patient and were aknowledged. 

## 2018-09-18 NOTE — Assessment & Plan Note (Signed)
Torsemide, Hydralazine 

## 2018-09-18 NOTE — Progress Notes (Signed)
Subjective:  Patient ID: [name], male    DOB: 05/25/1956  Age: 63 y.o. MRN: [id number]  CC: No chief complaint on file.   HPI [name] presents for chronic pain/ LBP, COPD, CAD, dyslipidemia f/u Pt gained wt over the holidays  Outpatient Medications Prior to Visit  Medication Sig Dispense Refill  . acetaminophen (TYLENOL) 325 MG tablet Take 2 tablets (650 mg total) by mouth every 6 (six) hours as needed for mild pain or headache.    . albuterol (PROVENTIL) (2.5 MG/3ML) 0.083% nebulizer solution Take 3 mLs (2.5 mg total) by nebulization daily as needed for wheezing or shortness of breath. 75 mL 11  . ANORO ELLIPTA 62.5-25 MCG/INH AEPB Inhale 1 puff into the lungs daily as needed. For wheezing & SOB 180 each 3  . aspirin EC 81 MG tablet Take 81 mg by mouth every evening.     . clonazePAM (KLONOPIN) 0.5 MG tablet TAKE 1 TABLET TWICE A DAY AS NEEDED FOR ANXIETY OR AT BEDTIME FOR INSOMNIA 60 tablet 3  . clopidogrel (PLAVIX) 75 MG tablet TAKE 1 TABLET DAILY WITH   BREAKFAST 90 tablet 3  . doxycycline (VIBRA-TABS) 100 MG tablet Take 1 tablet (100 mg total) by mouth 2 (two) times daily. 14 tablet 0  . KLOR-CON M20 20 MEQ tablet TAKE 1 TABLET DAILY 90 tablet 1  . metoprolol succinate (TOPROL-XL) 50 MG 24 hr tablet TAKE 2 TABLETS (100MG )&nbsp;&nbsp;&nbsp;&nbsp; DAILY WITH OR IMMEDIATELY&nbsp;&nbsp;FOLLOWING A MEAL 180 tablet 3  . oxyCODONE-acetaminophen (PERCOCET/ROXICET) 5-325 MG tablet Take 1 tablet by mouth every 6 (six) hours as needed for severe pain. Please fill on or after 09/05/18 120 tablet 0  . oxymetazoline (AFRIN) 0.05 % nasal spray Place 1 spray into both nostrils daily as needed for congestion.    . predniSONE (DELTASONE) 20 MG tablet Take 2 tablets (40 mg total) by mouth daily with breakfast. 14 tablet 0  . simvastatin (ZOCOR) 20 MG tablet TAKE 1 TABLET AT BEDTIME 90 tablet 3  . torsemide (DEMADEX) 100 MG tablet TAKE 1 TABLET DAILY 90 tablet 3  . nitroGLYCERIN  (NITROSTAT) 0.4 MG SL tablet Place 1 tablet (0.4 mg total) under the tongue every 5 (five) minutes as needed for chest pain (Up to 3 doses). 25 tablet 1   No facility-administered medications prior to visit.     ROS: Review of Systems  Constitutional: Positive for fatigue and unexpected weight change. Negative for appetite change.  HENT: Negative for congestion, nosebleeds, sneezing, sore throat and trouble swallowing.   Eyes: Negative for itching and visual disturbance.  Respiratory: Negative for cough.   Cardiovascular: Negative for chest pain, palpitations and leg swelling.  Gastrointestinal: Negative for abdominal distention, blood in stool, diarrhea and nausea.  Genitourinary: Negative for frequency and hematuria.  Musculoskeletal: Positive for arthralgias, back pain and gait problem. Negative for joint swelling and neck pain.  Skin: Negative for rash.  Neurological: Negative for dizziness, tremors, speech difficulty and weakness.  Psychiatric/Behavioral: Negative for agitation, dysphoric mood, sleep disturbance and suicidal ideas. The patient is nervous/anxious.     Objective:  BP 132/72 (BP Location: Left Arm, Patient Position: Sitting, Cuff Size: Large)   Pulse 69   Temp 98.5 F (36.9 C) (Oral)   Ht 6' (1.829 m)   Wt (!) 336 lb (152.4 kg)   SpO2 90%   BMI 45.57 kg/m   BP Readings from Last 3 Encounters:  09/18/18 132/72  07/25/18 (!) 129/49  06/19/18 124/68  Wt Readings from Last 3 Encounters:  09/18/18 (!) 336 lb (152.4 kg)  07/25/18 239 lb 9.6 oz (108.7 kg)  06/19/18 (!) 319 lb (144.7 kg)    Physical Exam Constitutional:      General: He is not in acute distress.    Appearance: He is well-developed.     Comments: NAD  Eyes:     Conjunctiva/sclera: Conjunctivae normal.     Pupils: Pupils are equal, round, and reactive to light.  Neck:     Musculoskeletal: Normal range of motion.     Thyroid: No thyromegaly.     Vascular: No JVD.  Cardiovascular:      Rate and Rhythm: Normal rate and regular rhythm.     Heart sounds: Normal heart sounds. No murmur. No friction rub. No gallop.   Pulmonary:     Effort: Pulmonary effort is normal. No respiratory distress.     Breath sounds: Normal breath sounds. No wheezing or rales.  Chest:     Chest wall: No tenderness.  Abdominal:     General: Bowel sounds are normal. There is no distension.     Palpations: Abdomen is soft. There is no mass.     Tenderness: There is abdominal tenderness. There is no guarding or rebound.  Musculoskeletal: Normal range of motion.        General: Tenderness present.     Right lower leg: Edema present.     Left lower leg: Edema present.  Lymphadenopathy:     Cervical: No cervical adenopathy.  Skin:    General: Skin is warm and dry.     Findings: No rash.  Neurological:     Mental Status: He is alert and oriented to person, place, and time.     Cranial Nerves: No cranial nerve deficit.     Motor: No abnormal muscle tone.     Coordination: Coordination abnormal.     Gait: Gait normal.     Deep Tendon Reflexes: Reflexes are normal and symmetric.  Psychiatric:        Behavior: Behavior normal.        Thought Content: Thought content normal.        Judgment: Judgment normal.   obese LS - pain w/ROM  Lab Results  Component Value Date   WBC 10.1 07/25/2018   HGB 14.3 07/25/2018   HCT 44.9 07/25/2018   PLT 143 (L) 07/25/2018   GLUCOSE 128 (H) 07/25/2018   CHOL 108 01/01/2018   TRIG 98 01/01/2018   HDL 35 (L) 01/01/2018   LDLCALC 53 01/01/2018   ALT 15 07/25/2018   AST 16 07/25/2018   NA 143 07/25/2018   K 4.2 07/25/2018   CL 97 (L) 07/25/2018   CREATININE 0.87 07/25/2018   BUN 14 07/25/2018   CO2 37 (H) 07/25/2018   TSH 1.25 06/19/2018   PSA 0.23 09/15/2011   INR 1.0 11/28/2017   HGBA1C 6.2 06/19/2018   MICROALBUR 0.7 03/29/2016    Ct Chest Wo Contrast  Result Date: 05/03/2018 CLINICAL DATA:  COPD/emphysema, asbestos exposure, annual follow-up  EXAM: CT CHEST WITHOUT CONTRAST TECHNIQUE: Multidetector CT imaging of the chest was performed following the standard protocol without IV contrast. COMPARISON:  CT chest dated 04/19/2017 FINDINGS: Cardiovascular: The heart is normal in size. No pericardial effusion. No evidence of thoracic aortic aneurysm. Atherosclerotic calcifications of the aortic arch. Three vessel coronary atherosclerosis. Postsurgical changes related to prior CABG. Mediastinum/Nodes: No suspicious mediastinal lymphadenopathy. Visualized thyroid is unremarkable. Lungs/Pleura: Calcified and noncalcified pleural plaques.  Associated subpleural reticulation/ground-glass with subpleural lines and scattered areas of parenchymal banding. Mild centrilobular and paraseptal emphysematous changes, upper lobe predominant. 3 mm subpleural nodule in the lateral right upper lobe (series 3/image 61), unchanged/benign. No new/suspicious pulmonary nodules. No pleural effusion or pneumothorax. Upper Abdomen: Visualized upper abdomen is notable for cholelithiasis and mild hepatic steatosis. Spleen is normal in size. Musculoskeletal: Gynecomastia. Degenerative changes of the visualized thoracolumbar spine. 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 No suspicious lymphadenopathy in this patient with history of lymphoma. Spleen is normal in size. Aortic Atherosclerosis (ICD10-I70.0) and Emphysema (ICD10-J43.9). Electronically Signed   By: Julian Hy M.D.   On: 05/03/2018 14:18    Assessment & Plan:   There are no diagnoses linked to this encounter.   No orders of the defined types were placed in this encounter.    Follow-up: No follow-ups on file.  Walker Kehr, MD

## 2018-09-18 NOTE — Assessment & Plan Note (Signed)
Back smoking - discussed

## 2018-09-23 DIAGNOSIS — J449 Chronic obstructive pulmonary disease, unspecified: Secondary | ICD-10-CM | POA: Diagnosis not present

## 2018-10-22 DIAGNOSIS — J449 Chronic obstructive pulmonary disease, unspecified: Secondary | ICD-10-CM | POA: Diagnosis not present

## 2018-11-07 ENCOUNTER — Other Ambulatory Visit: Payer: Self-pay

## 2018-11-07 MED ORDER — CLOPIDOGREL BISULFATE 75 MG PO TABS: 75.0000 mg | ORAL_TABLET | Freq: Every day | ORAL | 3 refills | Status: DC

## 2018-11-07 MED ORDER — TORSEMIDE 100 MG PO TABS: 100.0000 mg | ORAL_TABLET | Freq: Every day | ORAL | 3 refills | Status: DC

## 2018-11-07 MED ORDER — POTASSIUM CHLORIDE CRYS ER 20 MEQ PO TBCR: 20.0000 meq | EXTENDED_RELEASE_TABLET | Freq: Every day | ORAL | 1 refills | Status: DC

## 2018-11-07 MED ORDER — METOPROLOL SUCCINATE ER 50 MG PO TB24: 100.0000 mg | ORAL_TABLET | Freq: Every day | ORAL | 3 refills | Status: DC

## 2018-11-07 MED ORDER — SIMVASTATIN 20 MG PO TABS: 20.0000 mg | ORAL_TABLET | Freq: Every day | ORAL | 3 refills | Status: DC

## 2018-11-22 DIAGNOSIS — J449 Chronic obstructive pulmonary disease, unspecified: Secondary | ICD-10-CM | POA: Diagnosis not present

## 2018-12-05 ENCOUNTER — Telehealth: Payer: Self-pay | Admitting: Internal Medicine

## 2018-12-05 DIAGNOSIS — R310 Gross hematuria: Secondary | ICD-10-CM | POA: Diagnosis not present

## 2018-12-05 DIAGNOSIS — N390 Urinary tract infection, site not specified: Secondary | ICD-10-CM | POA: Diagnosis not present

## 2018-12-05 DIAGNOSIS — Z87442 Personal history of urinary calculi: Secondary | ICD-10-CM | POA: Diagnosis not present

## 2018-12-05 DIAGNOSIS — B9620 Unspecified Escherichia coli [E. coli] as the cause of diseases classified elsewhere: Secondary | ICD-10-CM | POA: Diagnosis not present

## 2018-12-05 NOTE — Telephone Encounter (Signed)
Spoke to [place] and told her it has not been checked since 2013

## 2018-12-05 NOTE — Telephone Encounter (Signed)
Copied from [place] [id number]. Topic: Quick Communication - See Telephone Encounter >> Dec 05, 2018 11:25 AM Vernona Rieger wrote: CRM for notification. See Telephone encounter for: 12/05/18.  Marlowe Kays with [facility] called and Dr Sherlene Shams would like to know if he has ever had a PSA done before at [facility]. Please call back @ [phone] ext [phone]

## 2018-12-06 NOTE — Telephone Encounter (Signed)
Noted  

## 2018-12-13 DIAGNOSIS — R310 Gross hematuria: Secondary | ICD-10-CM | POA: Diagnosis not present

## 2018-12-13 DIAGNOSIS — N200 Calculus of kidney: Secondary | ICD-10-CM | POA: Diagnosis not present

## 2018-12-18 ENCOUNTER — Encounter: Payer: BLUE CROSS/BLUE SHIELD | Admitting: Internal Medicine

## 2018-12-22 DIAGNOSIS — J449 Chronic obstructive pulmonary disease, unspecified: Secondary | ICD-10-CM | POA: Diagnosis not present

## 2018-12-30 ENCOUNTER — Other Ambulatory Visit: Payer: Self-pay | Admitting: Cardiovascular Disease

## 2018-12-30 ENCOUNTER — Other Ambulatory Visit: Payer: Self-pay | Admitting: Internal Medicine

## 2018-12-30 DIAGNOSIS — I779 Disorder of arteries and arterioles, unspecified: Secondary | ICD-10-CM

## 2018-12-30 NOTE — Telephone Encounter (Signed)
Copied from [place] [id number]. Topic: Quick Communication - Rx Refill/Question >> Dec 30, 2018 11:27 AM Reyne Dumas L wrote: Medication: oxyCODONE-acetaminophen (PERCOCET/ROXICET) 5-325 MG tablet  Has the patient contacted their pharmacy? Yes  (Agent: If no, request that the patient contact the pharmacy for the refill.) (Agent: If yes, when and what did the pharmacy advise?)  Preferred Pharmacy (with phone number or street name): [id number] [place], [place] - [address] [phone] (Phone) [phone] (Fax)  Agent: Please be advised that RX refills may take up to 3 business days. We ask that you follow-up with your pharmacy.

## 2018-12-31 NOTE — Telephone Encounter (Signed)
Please advise about refill

## 2019-01-01 MED ORDER — OXYCODONE-ACETAMINOPHEN 5-325 MG PO TABS: 1.0000 | ORAL_TABLET | Freq: Four times a day (QID) | ORAL | 0 refills | Status: DC | PRN

## 2019-01-01 NOTE — Telephone Encounter (Signed)
MD sent rx to pharmacy//lmb

## 2019-01-01 NOTE — Telephone Encounter (Signed)
Ok Thx 

## 2019-01-03 DIAGNOSIS — R310 Gross hematuria: Secondary | ICD-10-CM | POA: Diagnosis not present

## 2019-01-03 DIAGNOSIS — N200 Calculus of kidney: Secondary | ICD-10-CM | POA: Diagnosis not present

## 2019-01-13 ENCOUNTER — Telehealth: Payer: Self-pay | Admitting: Hematology and Oncology

## 2019-01-13 NOTE — Telephone Encounter (Signed)
Called patient per sch msg to see if 6/12 appt could be moved to an earlier time. Patient would rather move to a different day. Moved to 6/15 per patient request. Confirmed date and time

## 2019-01-21 ENCOUNTER — Ambulatory Visit (HOSPITAL_COMMUNITY)
Admission: RE | Admit: 2019-01-21 | Discharge: 2019-01-21 | Disposition: A | Discharge status: Home / Self Care | Payer: BC Managed Care – PPO | Source: Ambulatory Visit | Attending: Internal Medicine | Admitting: Internal Medicine

## 2019-01-21 ENCOUNTER — Telehealth: Payer: Self-pay

## 2019-01-21 ENCOUNTER — Other Ambulatory Visit: Payer: Self-pay

## 2019-01-21 ENCOUNTER — Other Ambulatory Visit (HOSPITAL_COMMUNITY): Payer: Self-pay | Admitting: Cardiovascular Disease

## 2019-01-21 DIAGNOSIS — I739 Peripheral vascular disease, unspecified: Secondary | ICD-10-CM | POA: Diagnosis not present

## 2019-01-21 DIAGNOSIS — I779 Disorder of arteries and arterioles, unspecified: Secondary | ICD-10-CM

## 2019-01-21 DIAGNOSIS — I6523 Occlusion and stenosis of bilateral carotid arteries: Secondary | ICD-10-CM

## 2019-01-21 NOTE — Telephone Encounter (Signed)
-----   Message from [name], MD sent at 01/21/2019  4:55 PM EDT ----- Plaque no stenosis f/u carotid duplex in 2 years

## 2019-01-21 NOTE — Telephone Encounter (Signed)
Patient aware of results. Per Dr. Johnsie Cancel, Plaque no stenosis f/u carotid duplex in 2 years. Patient verbalized understanding.

## 2019-01-22 ENCOUNTER — Other Ambulatory Visit: Payer: Self-pay | Admitting: Hematology and Oncology

## 2019-01-22 DIAGNOSIS — C8228 Follicular lymphoma grade III, unspecified, lymph nodes of multiple sites: Secondary | ICD-10-CM

## 2019-01-22 DIAGNOSIS — J449 Chronic obstructive pulmonary disease, unspecified: Secondary | ICD-10-CM | POA: Diagnosis not present

## 2019-01-23 ENCOUNTER — Ambulatory Visit: Payer: BLUE CROSS/BLUE SHIELD | Admitting: Hematology and Oncology

## 2019-01-23 ENCOUNTER — Other Ambulatory Visit: Payer: BLUE CROSS/BLUE SHIELD

## 2019-01-24 ENCOUNTER — Other Ambulatory Visit: Payer: BLUE CROSS/BLUE SHIELD

## 2019-01-24 ENCOUNTER — Ambulatory Visit: Payer: BLUE CROSS/BLUE SHIELD | Admitting: Hematology and Oncology

## 2019-01-27 ENCOUNTER — Ambulatory Visit: Payer: BLUE CROSS/BLUE SHIELD | Admitting: Hematology and Oncology

## 2019-01-27 ENCOUNTER — Other Ambulatory Visit: Payer: BLUE CROSS/BLUE SHIELD

## 2019-01-28 ENCOUNTER — Telehealth: Payer: Self-pay | Admitting: Hematology and Oncology

## 2019-01-28 NOTE — Telephone Encounter (Signed)
Scheduled appt per 6/12 sch message. Spoke with patient and patient awaer of appt date and time.

## 2019-01-30 ENCOUNTER — Other Ambulatory Visit: Payer: Self-pay | Admitting: Internal Medicine

## 2019-01-30 NOTE — Telephone Encounter (Signed)
Please advise, pt scheduled 02/10/19

## 2019-01-30 NOTE — Telephone Encounter (Signed)
Medication Refill - Medication: oxyCODONE-acetaminophen (PERCOCET/ROXICET) 5-325 MG tablet  Pt has enough to get thru the weekend  Has the patient contacted their pharmacy? No. controlled med  Preferred Pharmacy (with phone number or street name):  [id number] [place], [place] - [address] [phone] (Phone) [phone] (Fax)

## 2019-02-03 NOTE — Telephone Encounter (Signed)
What date does he needed it for?  Thanks

## 2019-02-03 NOTE — Telephone Encounter (Signed)
Pt called and is requesting update on this medication. Please advise.

## 2019-02-04 MED ORDER — OXYCODONE-ACETAMINOPHEN 5-325 MG PO TABS: 1.0000 | ORAL_TABLET | Freq: Four times a day (QID) | ORAL | 0 refills | Status: DC | PRN

## 2019-02-04 NOTE — Telephone Encounter (Signed)
todays date, RX updated

## 2019-02-10 ENCOUNTER — Ambulatory Visit (INDEPENDENT_AMBULATORY_CARE_PROVIDER_SITE_OTHER): Payer: BC Managed Care – PPO | Admitting: Internal Medicine

## 2019-02-10 ENCOUNTER — Other Ambulatory Visit: Payer: Self-pay

## 2019-02-10 ENCOUNTER — Other Ambulatory Visit (INDEPENDENT_AMBULATORY_CARE_PROVIDER_SITE_OTHER): Payer: BC Managed Care – PPO

## 2019-02-10 ENCOUNTER — Encounter: Payer: Self-pay | Admitting: Internal Medicine

## 2019-02-10 VITALS — BP 132/68 | HR 74 | Temp 98.6°F | Ht 72.0 in | Wt 321.0 lb

## 2019-02-10 DIAGNOSIS — I10 Essential (primary) hypertension: Secondary | ICD-10-CM | POA: Diagnosis not present

## 2019-02-10 DIAGNOSIS — J449 Chronic obstructive pulmonary disease, unspecified: Secondary | ICD-10-CM

## 2019-02-10 DIAGNOSIS — M79605 Pain in left leg: Secondary | ICD-10-CM

## 2019-02-10 DIAGNOSIS — F411 Generalized anxiety disorder: Secondary | ICD-10-CM

## 2019-02-10 DIAGNOSIS — R609 Edema, unspecified: Secondary | ICD-10-CM

## 2019-02-10 DIAGNOSIS — M545 Low back pain, unspecified: Secondary | ICD-10-CM

## 2019-02-10 DIAGNOSIS — E1151 Type 2 diabetes mellitus with diabetic peripheral angiopathy without gangrene: Secondary | ICD-10-CM

## 2019-02-10 DIAGNOSIS — I5032 Chronic diastolic (congestive) heart failure: Secondary | ICD-10-CM

## 2019-02-10 DIAGNOSIS — Z0000 Encounter for general adult medical examination without abnormal findings: Secondary | ICD-10-CM

## 2019-02-10 LAB — TSH: TSH: 0.88 u[IU]/mL (ref 0.35–4.50)

## 2019-02-10 MED ORDER — CLONAZEPAM 0.5 MG PO TABS: ORAL_TABLET | ORAL | 3 refills | Status: DC

## 2019-02-10 MED ORDER — METOLAZONE 5 MG PO TABS: 5.0000 mg | ORAL_TABLET | Freq: Every day | ORAL | 3 refills | Status: DC

## 2019-02-10 NOTE — Assessment & Plan Note (Signed)
We discussed age appropriate health related issues, including available/recomended screening tests and vaccinations. We discussed a need for adhering to healthy diet and exercise. Labs were ordered to be later reviewed . All questions were answered.   

## 2019-02-10 NOTE — Assessment & Plan Note (Signed)
Proventil prn 

## 2019-02-10 NOTE — Assessment & Plan Note (Signed)
Labs

## 2019-02-10 NOTE — Patient Instructions (Signed)
  Stop eating watermelon with salt

## 2019-02-10 NOTE — Progress Notes (Signed)
Subjective:  Patient ID: [name], male    DOB: Jan 08, 1956  Age: 63 y.o. MRN: [id number]  CC: No chief complaint on file.   HPI [name] presents for a well exam and for COPD, anxiety, LBP f/u Lost wt  C/o LE edema - worse  Outpatient Medications Prior to Visit  Medication Sig Dispense Refill   acetaminophen (TYLENOL) 325 MG tablet Take 2 tablets (650 mg total) by mouth every 6 (six) hours as needed for mild pain or headache.     albuterol (PROVENTIL) (2.5 MG/3ML) 0.083% nebulizer solution Take 3 mLs (2.5 mg total) by nebulization daily as needed for wheezing or shortness of breath. 75 mL 11   ANORO ELLIPTA 62.5-25 MCG/INH AEPB Inhale 1 puff into the lungs daily as needed. For wheezing & SOB 180 each 3   aspirin EC 81 MG tablet Take 81 mg by mouth every evening.      clonazePAM (KLONOPIN) 0.5 MG tablet TAKE 1 TABLET TWICE A DAY AS NEEDED FOR ANXIETY OR AT BEDTIME FOR INSOMNIA 60 tablet 3   clopidogrel (PLAVIX) 75 MG tablet Take 1 tablet (75 mg total) by mouth daily with breakfast. 90 tablet 3   doxycycline (VIBRA-TABS) 100 MG tablet Take 1 tablet (100 mg total) by mouth 2 (two) times daily. 14 tablet 0   metoprolol succinate (TOPROL-XL) 50 MG 24 hr tablet Take 2 tablets (100 mg total) by mouth daily. 180 tablet 3   oxyCODONE-acetaminophen (PERCOCET/ROXICET) 5-325 MG tablet Take 1 tablet by mouth every 6 (six) hours as needed for severe pain. 120 tablet 0   oxyCODONE-acetaminophen (PERCOCET/ROXICET) 5-325 MG tablet Take 1 tablet by mouth every 6 (six) hours as needed for severe pain. Please fill on or after 01/01/19 120 tablet 0   oxyCODONE-acetaminophen (PERCOCET/ROXICET) 5-325 MG tablet Take 1 tablet by mouth every 6 (six) hours as needed for severe pain. 120 tablet 0   oxymetazoline (AFRIN) 0.05 % nasal spray Place 1 spray into both nostrils daily as needed for congestion.     potassium chloride SA (KLOR-CON M20) 20 MEQ tablet Take 1 tablet (20 mEq  total) by mouth daily. 90 tablet 1   predniSONE (DELTASONE) 20 MG tablet Take 2 tablets (40 mg total) by mouth daily with breakfast. 14 tablet 0   simvastatin (ZOCOR) 20 MG tablet Take 1 tablet (20 mg total) by mouth at bedtime. 90 tablet 3   torsemide (DEMADEX) 100 MG tablet Take 1 tablet (100 mg total) by mouth daily. 90 tablet 3   nitroGLYCERIN (NITROSTAT) 0.4 MG SL tablet Place 1 tablet (0.4 mg total) under the tongue every 5 (five) minutes as needed for chest pain (Up to 3 doses). 25 tablet 1   No facility-administered medications prior to visit.     ROS: Review of Systems  Constitutional: Negative for appetite change, fatigue and unexpected weight change.  HENT: Negative for congestion, nosebleeds, sneezing, sore throat and trouble swallowing.   Eyes: Negative for itching and visual disturbance.  Respiratory: Negative for cough.   Cardiovascular: Positive for leg swelling. Negative for chest pain and palpitations.  Gastrointestinal: Negative for abdominal distention, blood in stool, diarrhea and nausea.  Genitourinary: Negative for frequency and hematuria.  Musculoskeletal: Positive for arthralgias and back pain. Negative for gait problem, joint swelling and neck pain.  Skin: Negative for rash.  Neurological: Negative for dizziness, tremors, speech difficulty and weakness.  Psychiatric/Behavioral: Negative for agitation, dysphoric mood, sleep disturbance and suicidal ideas. The patient is nervous/anxious.  Objective:  BP 132/68 (BP Location: Left Arm, Patient Position: Sitting, Cuff Size: Large)    Pulse 74    Temp 98.6 F (37 C) (Oral)    Ht 6' (1.829 m)    Wt (!) 321 lb (145.6 kg)    SpO2 (!) 86%    BMI 43.54 kg/m   BP Readings from Last 3 Encounters:  02/10/19 132/68  09/18/18 132/72  07/25/18 (!) 129/49    Wt Readings from Last 3 Encounters:  02/10/19 (!) 321 lb (145.6 kg)  09/18/18 (!) 336 lb (152.4 kg)  07/25/18 239 lb 9.6 oz (108.7 kg)    Physical  Exam Constitutional:      General: He is not in acute distress.    Appearance: He is well-developed.     Comments: NAD  Eyes:     Conjunctiva/sclera: Conjunctivae normal.     Pupils: Pupils are equal, round, and reactive to light.  Neck:     Musculoskeletal: Normal range of motion.     Thyroid: No thyromegaly.     Vascular: No JVD.  Cardiovascular:     Rate and Rhythm: Normal rate and regular rhythm.     Heart sounds: Normal heart sounds. No murmur. No friction rub. No gallop.   Pulmonary:     Effort: Pulmonary effort is normal. No respiratory distress.     Breath sounds: Normal breath sounds. No wheezing or rales.  Chest:     Chest wall: No tenderness.  Abdominal:     General: Bowel sounds are normal. There is no distension.     Palpations: Abdomen is soft. There is no mass.     Tenderness: There is no abdominal tenderness. There is no guarding or rebound.  Musculoskeletal: Normal range of motion.        General: Swelling and tenderness present.     Right lower leg: Edema present.     Left lower leg: Edema present.  Lymphadenopathy:     Cervical: No cervical adenopathy.  Skin:    General: Skin is warm and dry.     Findings: No rash.  Neurological:     Mental Status: He is alert and oriented to person, place, and time.     Cranial Nerves: No cranial nerve deficit.     Motor: Weakness present. No abnormal muscle tone.     Coordination: Coordination normal.     Gait: Gait normal.     Deep Tendon Reflexes: Reflexes are normal and symmetric.  Psychiatric:        Behavior: Behavior normal.        Thought Content: Thought content normal.        Judgment: Judgment normal.    LEs w/3+ edema, oozong fluid   Lab Results  Component Value Date   WBC 10.1 07/25/2018   HGB 14.3 07/25/2018   HCT 44.9 07/25/2018   PLT 143 (L) 07/25/2018   GLUCOSE 128 (H) 07/25/2018   CHOL 108 01/01/2018   TRIG 98 01/01/2018   HDL 35 (L) 01/01/2018   LDLCALC 53 01/01/2018   ALT 15  07/25/2018   AST 16 07/25/2018   NA 143 07/25/2018   K 4.2 07/25/2018   CL 97 (L) 07/25/2018   CREATININE 0.87 07/25/2018   BUN 14 07/25/2018   CO2 37 (H) 07/25/2018   TSH 1.25 06/19/2018   PSA 0.23 09/15/2011   INR 1.0 11/28/2017   HGBA1C 6.2 06/19/2018   MICROALBUR 0.7 03/29/2016    Vas US Carotid  Result Date: 01/21/2019  Carotid Arterial Duplex Study Indications:       Carotid artery disease follow up, history of left                    endarterectomy 2009.                    Patient has no cerebrovascular symptoms today. Risk Factors:      Hypertension, hyperlipidemia, Diabetes, past history of                    smoking, coronary artery disease. Other Factors:     Patient has severe asthma and COPD. Prior CABG 2005. Comparison Study:  Prior carotid duplex exam done 12/28/2017 showed right mid ICA                    velocity 115/18 cm/sec and left mid ICA 102/26 cm/sec. The                    left subclavian artery had a velocity of 341 cm/sec. Performing Technologist: Salvadore Dom RVT, RDCS (AE), RDMS Supporting Technologist: Wilkie Aye RVT  Examination Guidelines: A complete evaluation includes B-mode imaging, spectral Doppler, color Doppler, and power Doppler as needed of all accessible portions of each vessel. Bilateral testing is considered an integral part of a complete examination. Limited examinations for reoccurring indications may be performed as noted.  Right Carotid Findings: +----------+--------+--------+--------+-------------------------+--------+             PSV cm/s EDV cm/s Stenosis Describe                  Comments  +----------+--------+--------+--------+-------------------------+--------+  CCA Prox   138      13                heterogenous                        +----------+--------+--------+--------+-------------------------+--------+  CCA Mid    93       20                heterogenous                         +----------+--------+--------+--------+-------------------------+--------+  CCA Distal 72       19                                                    +----------+--------+--------+--------+-------------------------+--------+  ICA Prox   105      29                calcific and heterogenous           +----------+--------+--------+--------+-------------------------+--------+  ICA Mid    119      36       1-39%                                        +----------+--------+--------+--------+-------------------------+--------+  ICA Distal 102      28                                                    +----------+--------+--------+--------+-------------------------+--------+  ECA        338      29       >50%                                         +----------+--------+--------+--------+-------------------------+--------+ +----------+--------+-------+----------------+-------------------+             PSV cm/s EDV cms Describe         Arm Pressure (mmHG)  +----------+--------+-------+----------------+-------------------+  Subclavian 220              Multiphasic, WNL 150                  +----------+--------+-------+----------------+-------------------+ +---------+--------+--+--------+--+---------+  Vertebral PSV cm/s 41 EDV cm/s 10 Antegrade  +---------+--------+--+--------+--+---------+  Left Carotid Findings: +----------+--------+--------+--------+------------+-----------------------+             PSV cm/s EDV cm/s Stenosis Describe     Comments                 +----------+--------+--------+--------+------------+-----------------------+  CCA Prox   123      15                                                      +----------+--------+--------+--------+------------+-----------------------+  CCA Mid    97       21                heterogenous                          +----------+--------+--------+--------+------------+-----------------------+  CCA Distal 77       18                                                       +----------+--------+--------+--------+------------+-----------------------+  ICA Prox   80       24                             intimal hyperplasia-CEA  +----------+--------+--------+--------+------------+-----------------------+  ICA Mid    119      33       1-39%                                          +----------+--------+--------+--------+------------+-----------------------+  ICA Distal 93       26                                                      +----------+--------+--------+--------+------------+-----------------------+  ECA        492      46       >50%     heterogenous                          +----------+--------+--------+--------+------------+-----------------------+ +----------+--------+--------+--------+-------------------+  Subclavian PSV cm/s EDV cm/s Describe Arm Pressure (mmHG)  +----------+--------+--------+--------+-------------------+             405               Stenotic 150                  +----------+--------+--------+--------+-------------------+ +---------+--------+--+--------+--+---------+  Vertebral PSV cm/s 87 EDV cm/s 27 Antegrade  +---------+--------+--+--------+--+---------+  Summary: Right Carotid: Velocities in the right ICA are consistent with a 1-39% stenosis.                The ECA appears >50% stenosed. Left Carotid: Velocities in the left ICA are consistent with a 1-39% stenosis.               The ECA appears >50% stenosed. Vertebrals:  Bilateral vertebral arteries demonstrate antegrade flow. Subclavians: Left subclavian artery was stenotic. Normal flow hemodynamics were              seen in the right subclavian artery. *See table(s) above for measurements and observations. Suggest follow up study in 12 months. Electronically signed by Ena Dawley MD on 01/21/2019 at 6:23:20 PM.    Final     Assessment & Plan:   There are no diagnoses linked to this encounter.   No orders of the defined types were placed in this encounter.    Follow-up: No follow-ups on  file.  Walker Kehr, MD

## 2019-02-10 NOTE — Assessment & Plan Note (Signed)
Torsemide, Hydralazine

## 2019-02-10 NOTE — Assessment & Plan Note (Signed)
Worse Added Zarox Cont Torsemide Stop eating watermelon w/salt

## 2019-02-10 NOTE — Assessment & Plan Note (Signed)
Wt Readings from Last 3 Encounters:  02/10/19 (!) 321 lb (145.6 kg)  09/18/18 (!) 336 lb (152.4 kg)  07/25/18 239 lb 9.6 oz (108.7 kg)  on diet

## 2019-02-10 NOTE — Assessment & Plan Note (Signed)
Percocet prn Not to take w/Clonazepam  Potential benefits of a long term opioids use as well as potential risks (i.e. addiction risk, apnea etc) and complications (i.e. Somnolence, constipation and others) were explained to the patient and were aknowledged. 

## 2019-02-11 LAB — BASIC METABOLIC PANEL
BUN: 13 mg/dL (ref 6–23)
CO2: 35 mEq/L — ABNORMAL HIGH (ref 19–32)
Calcium: 9.7 mg/dL (ref 8.4–10.5)
Chloride: 95 mEq/L — ABNORMAL LOW (ref 96–112)
Creatinine, Ser: 0.9 mg/dL (ref 0.40–1.50)
GFR: 84.69 mL/min (ref >60.00)
Glucose, Bld: 98 mg/dL (ref 70–99)
Potassium: 4.5 mEq/L (ref 3.5–5.1)
Sodium: 141 mEq/L (ref 135–145)

## 2019-02-21 DIAGNOSIS — J449 Chronic obstructive pulmonary disease, unspecified: Secondary | ICD-10-CM | POA: Diagnosis not present

## 2019-03-06 ENCOUNTER — Other Ambulatory Visit: Payer: BC Managed Care – PPO

## 2019-03-06 ENCOUNTER — Other Ambulatory Visit: Payer: Self-pay | Admitting: Internal Medicine

## 2019-03-06 ENCOUNTER — Ambulatory Visit: Payer: BC Managed Care – PPO | Admitting: Hematology and Oncology

## 2019-03-06 NOTE — Telephone Encounter (Signed)
Medication Refill - Medication: oxyCODONE-acetaminophen (PERCOCET/ROXICET) 5-325 MG tablet  Has the patient contacted their pharmacy? Yes.   (Agent: If no, request that the patient contact the pharmacy for the refill.) (Agent: If yes, when and what did the pharmacy advise?)  Preferred Pharmacy (with phone number or street name):  [id number] [place], [place] - [address]  [address] [place] [address]  Phone: [phone] Fax: [phone]   Agent: Please be advised that RX refills may take up to 3 business days. We ask that you follow-up with your pharmacy.

## 2019-03-06 NOTE — Telephone Encounter (Signed)
[place] Controlled Database Checked Last filled: 02/05/19 # 120 LOV w/you: 02/10/19 Next appt w/you:05/15/19

## 2019-03-10 NOTE — Telephone Encounter (Signed)
Pt called to check status of refill for oxyCODONE-acetaminophen (PERCOCET/ROXICET) 5-325 MG tablet  / please advise when sent to the pharmacy

## 2019-03-10 NOTE — Telephone Encounter (Signed)
[place] to [place]

## 2019-03-11 MED ORDER — OXYCODONE-ACETAMINOPHEN 5-325 MG PO TABS: 1.0000 | ORAL_TABLET | Freq: Four times a day (QID) | ORAL | 0 refills | Status: DC | PRN

## 2019-03-11 NOTE — Telephone Encounter (Signed)
[place] Controlled Database Checked Last filled: 02/05/19 # 120 LOV w/you: 02/10/19 Next appt w/you: 05/15/19

## 2019-03-17 ENCOUNTER — Telehealth: Payer: Self-pay

## 2019-03-17 NOTE — Telephone Encounter (Signed)
Received msg from after hours call center that pt had called evening of 7/31 to cancel / reschedule appt's for 8/4. Called pt at all numbers provided but no answer and no vm set up.

## 2019-03-18 ENCOUNTER — Inpatient Hospital Stay: Payer: BC Managed Care – PPO | Admitting: Hematology and Oncology

## 2019-03-18 ENCOUNTER — Inpatient Hospital Stay: Payer: BC Managed Care – PPO

## 2019-03-24 DIAGNOSIS — J449 Chronic obstructive pulmonary disease, unspecified: Secondary | ICD-10-CM | POA: Diagnosis not present

## 2019-03-27 ENCOUNTER — Other Ambulatory Visit: Payer: Self-pay | Admitting: Internal Medicine

## 2019-03-28 ENCOUNTER — Telehealth: Payer: Self-pay | Admitting: Hematology and Oncology

## 2019-03-28 NOTE — Telephone Encounter (Signed)
Confirmed new appointment for 9/14 with patient. Rescheduled from 8/18 per patient.

## 2019-04-01 ENCOUNTER — Inpatient Hospital Stay: Payer: BC Managed Care – PPO

## 2019-04-01 ENCOUNTER — Inpatient Hospital Stay: Payer: BC Managed Care – PPO | Admitting: Hematology and Oncology

## 2019-04-08 ENCOUNTER — Telehealth: Payer: Self-pay

## 2019-04-09 NOTE — Telephone Encounter (Signed)
[place] Controlled Database Checked Last filled: 03/11/19 #120 LOV w/you: 02/10/19 Next appt w/you: 05/15/19

## 2019-04-10 MED ORDER — OXYCODONE-ACETAMINOPHEN 5-325 MG PO TABS: 1.0000 | ORAL_TABLET | Freq: Four times a day (QID) | ORAL | 0 refills | Status: DC | PRN

## 2019-04-11 NOTE — Telephone Encounter (Signed)
Patient requesting call back from CMA to discuss this medication.

## 2019-04-14 NOTE — Telephone Encounter (Addendum)
MD has already sent refill.Marland KitchenJohny Chess

## 2019-04-14 NOTE — Telephone Encounter (Signed)
Patient called in stating the PA is still needing to be done with insurance. Patient wanting this done today as he is going on vacation. Please advise.

## 2019-04-15 NOTE — Telephone Encounter (Signed)
Pt stated he needs the doctor to approve his medication. Pt stated [facility] has it but will not release the medication to him until the doctor approves it. Pt requests call back

## 2019-04-17 NOTE — Telephone Encounter (Signed)
Patient called in stating he is becoming irritated that office has not called. Please advise. Patient is wanting call today.

## 2019-04-18 ENCOUNTER — Other Ambulatory Visit: Payer: Self-pay

## 2019-04-18 ENCOUNTER — Telehealth: Payer: Self-pay | Admitting: *Deleted

## 2019-04-18 NOTE — Telephone Encounter (Signed)
I called for Oxy/APAP PA. I spoke to [place] and answered all questions. The PA is approved until 10/16/2019.   Pt ID: [id number] Pt informed via My Chart.

## 2019-04-22 NOTE — Telephone Encounter (Signed)
[place] Controlled Database Checked Last filled: 03/11/19 # 120 LOV w/you: 02/10/19 Next appt w/you: [id number]  Duplicate? Sent in on 04/10/19 ?

## 2019-04-24 DIAGNOSIS — J449 Chronic obstructive pulmonary disease, unspecified: Secondary | ICD-10-CM | POA: Diagnosis not present

## 2019-04-28 ENCOUNTER — Other Ambulatory Visit: Payer: Self-pay

## 2019-04-28 ENCOUNTER — Inpatient Hospital Stay (HOSPITAL_BASED_OUTPATIENT_CLINIC_OR_DEPARTMENT_OTHER): Payer: BC Managed Care – PPO | Admitting: Hematology and Oncology

## 2019-04-28 ENCOUNTER — Inpatient Hospital Stay: Payer: BC Managed Care – PPO | Attending: Hematology and Oncology

## 2019-04-28 DIAGNOSIS — D696 Thrombocytopenia, unspecified: Secondary | ICD-10-CM | POA: Diagnosis not present

## 2019-04-28 DIAGNOSIS — Z87891 Personal history of nicotine dependence: Secondary | ICD-10-CM | POA: Diagnosis not present

## 2019-04-28 DIAGNOSIS — C8228 Follicular lymphoma grade III, unspecified, lymph nodes of multiple sites: Secondary | ICD-10-CM | POA: Diagnosis not present

## 2019-04-28 LAB — CBC WITH DIFFERENTIAL/PLATELET
Abs Immature Granulocytes: 0.06 10*3/uL (ref 0.00–0.07)
Basophils Absolute: 0.1 10*3/uL (ref 0.0–0.1)
Basophils Relative: 1 %
Eosinophils Absolute: 0.3 10*3/uL (ref 0.0–0.5)
Eosinophils Relative: 2 %
HCT: 49.4 % (ref 39.0–52.0)
Hemoglobin: 16.3 g/dL (ref 13.0–17.0)
Immature Granulocytes: 1 %
Lymphocytes Relative: 11 %
Lymphs Abs: 1.3 10*3/uL (ref 0.7–4.0)
MCH: 32.9 pg (ref 26.0–34.0)
MCHC: 33 g/dL (ref 30.0–36.0)
MCV: 99.8 fL (ref 80.0–100.0)
Monocytes Absolute: 1.2 10*3/uL — ABNORMAL HIGH (ref 0.1–1.0)
Monocytes Relative: 11 %
Neutro Abs: 8.3 10*3/uL — ABNORMAL HIGH (ref 1.7–7.7)
Neutrophils Relative %: 74 %
Platelets: 147 10*3/uL — ABNORMAL LOW (ref 150–400)
RBC: 4.95 MIL/uL (ref 4.22–5.81)
RDW: 12.6 % (ref 11.5–15.5)
WBC: 11.2 10*3/uL — ABNORMAL HIGH (ref 4.0–10.5)
nRBC: 0 % (ref 0.0–0.2)

## 2019-04-28 LAB — COMPREHENSIVE METABOLIC PANEL
ALT: 18 U/L (ref 0–44)
AST: 18 U/L (ref 15–41)
Albumin: 4.5 g/dL (ref 3.5–5.0)
Alkaline Phosphatase: 84 U/L (ref 38–126)
Anion gap: 8 (ref 5–15)
BUN: 13 mg/dL (ref 8–23)
CO2: 39 mmol/L — ABNORMAL HIGH (ref 22–32)
Calcium: 9.6 mg/dL (ref 8.9–10.3)
Chloride: 93 mmol/L — ABNORMAL LOW (ref 98–111)
Creatinine, Ser: 0.93 mg/dL (ref 0.61–1.24)
GFR calc Af Amer: >60 mL/min (ref >60)
GFR calc non Af Amer: >60 mL/min (ref >60)
Glucose, Bld: 100 mg/dL — ABNORMAL HIGH (ref 70–99)
Potassium: 3.9 mmol/L (ref 3.5–5.1)
Sodium: 140 mmol/L (ref 135–145)
Total Bilirubin: 0.4 mg/dL (ref 0.3–1.2)
Total Protein: 7.3 g/dL (ref 6.5–8.1)

## 2019-04-28 MED ORDER — OXYCODONE-ACETAMINOPHEN 5-325 MG PO TABS: 1.0000 | ORAL_TABLET | Freq: Four times a day (QID) | ORAL | 0 refills | Status: DC | PRN

## 2019-04-29 ENCOUNTER — Telehealth: Payer: Self-pay | Admitting: Hematology and Oncology

## 2019-04-29 NOTE — Telephone Encounter (Signed)
I talk with patient he said he would look at his my chart

## 2019-04-30 ENCOUNTER — Encounter: Payer: Self-pay | Admitting: Hematology and Oncology

## 2019-04-30 NOTE — Assessment & Plan Note (Signed)
This is likely due to fatty liver disease. The patient denies recent history of bleeding such as epistaxis, hematuria or hematochezia. He is asymptomatic from the low platelet count. I will observe for now.

## 2019-04-30 NOTE — Progress Notes (Signed)
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 Clinically, he has no signs or symptoms to suggest cancer recurrence I will see him back in 12 months with repeat history, physical examination and blood work I do not recommend routine surveillance imaging study  Thrombocytopenia ([place]) This is likely due to fatty liver disease. The patient denies recent history of bleeding such as epistaxis, hematuria or hematochezia. He is asymptomatic from the low platelet count. I will observe for now.   No orders of the defined types were placed in this encounter.   INTERVAL HISTORY: Please see below for problem oriented charting. He returns for follow-up on lymphoma diagnosis He has no new lymphadenopathy He has quit smoking Denies recent infection, fever or chills  SUMMARY OF ONCOLOGIC HISTORY: Oncology History Overview Note  FLIPI score of 2; stage 3 and areas of involvement >4   Follicular lymphoma ([place])  06/24/2009 Imaging   PET CT scan showed two small FDG positive left neck nodes   12/11/2014 Surgery   He underwent excisional lymph node biopsy of the left supraclavicular lymph node/neck region   12/11/2014 Pathology Results   Accession: [id number] biopsy show follicular lymphoma   [id number] Imaging   ECHO showed LVH but preserved EF   12/25/2014 Imaging   PET scan showed disease above and below diaphragm   12/28/2014 Procedure   He has port  placement   12/31/2014 - 01/01/2015 Chemotherapy   He received 1 cycle of bendamustine with rituximab, discontinued due to suspicion of allergic reaction to bendamustine   01/12/2015 - 01/19/2015 Hospital Admission   He was admitted to the hospital with suspicious allergic reaction, significant bilateral lower extremity edema with cellulitis, pneumonia and mild fluid overload   03/18/2015 - 04/08/2015 Chemotherapy   Treatment is switched to rituximab, Cytoxan, vincristine and prednisone   04/29/2015 Imaging    PET CT scan showed near complete response to treatment.   04/30/2015 - 01/12/2017 Chemotherapy   He received maintenance Rituxan   11/03/2016 PET scan   No metabolic evidence of recurrent lymphoma. Mild patchy marrow hypermetabolism throughout the axial skeleton, unchanged, probably due to a mildly reactive marrow state. 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   04/19/2017 Imaging   1. No evidence for lymphadenopathy in the chest, abdomen, or pelvis. 2. Aortic Atherosclerois (ICD10-170.0) 3. 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 4. Bilateral nonobstructing nephrolithiasis. 5. Cholelithiasis. 6. Hepatic steatosis   05/18/2017 Procedure   Successful removal of implanted Port-A-Cath     REVIEW OF SYSTEMS:   Constitutional: Denies fevers, chills or abnormal weight loss Eyes: Denies blurriness of vision Ears, nose, mouth, throat, and face: Denies mucositis or sore throat Respiratory: Denies cough, dyspnea or wheezes Cardiovascular: Denies palpitation, chest discomfort or lower extremity swelling Gastrointestinal:  Denies nausea,  heartburn or change in bowel habits Skin: Denies abnormal skin rashes  Lymphatics: Denies new lymphadenopathy or easy bruising Neurological:Denies numbness, tingling or new weaknesses Behavioral/Psych: Mood is stable, no new changes  All other systems were reviewed with the patient and are negative.  I have reviewed the past medical history, past surgical history, social history and family history with the patient and they are unchanged from previous note.  ALLERGIES:  is allergic to bendamustine hcl; heparin; and tape.  MEDICATIONS:  Current Outpatient Medications  Medication Sig Dispense Refill  . acetaminophen (TYLENOL) 325 MG tablet Take 2 tablets (650 mg total) by mouth every 6 (six) hours as needed for mild pain or headache.    . albuterol (PROVENTIL) (2.5 MG/3ML) 0.083% nebulizer solution Take 3 mLs (2.5 mg total) by nebulization daily as needed for wheezing or shortness of breath. 75 mL 11  . ANORO ELLIPTA 62.5-25 MCG/INH AEPB Inhale 1 puff into the lungs daily as needed. For wheezing & SOB 180 each 3  . aspirin EC 81 MG tablet Take 81 mg by mouth every evening.     . clonazePAM (KLONOPIN) 0.5 MG tablet TAKE 1 TABLET TWICE A DAY AS NEEDED FOR ANXIETY OR AT BEDTIME FOR INSOMNIA 60 tablet 3  . clopidogrel (PLAVIX) 75 MG tablet Take 1 tablet (75 mg total) by mouth daily with breakfast. 90 tablet 3  . metolazone (ZAROXOLYN) 5 MG tablet Take 1 tablet (5 mg total) by mouth daily. 90 tablet 3  . metoprolol succinate (TOPROL-XL) 50 MG 24 hr tablet Take 2 tablets (100 mg total) by mouth daily. 180 tablet 3  . nitroGLYCERIN (NITROSTAT) 0.4 MG SL tablet Place 1 tablet (0.4 mg total) under the tongue every 5 (five) minutes as needed for chest pain (Up to 3 doses). 25 tablet 1  . oxyCODONE-acetaminophen (PERCOCET/ROXICET) 5-325 MG tablet Take 1 tablet by mouth every 6 (six) hours as needed for severe pain. 120 tablet 0  . oxyCODONE-acetaminophen (PERCOCET/ROXICET) 5-325 MG tablet Take 1 tablet by  mouth every 6 (six) hours as needed for severe pain. Please fill on or after 01/01/19 120 tablet 0  . oxyCODONE-acetaminophen (PERCOCET/ROXICET) 5-325 MG tablet Take 1 tablet by mouth every 6 (six) hours as needed for severe pain. 120 tablet 0  . oxymetazoline (AFRIN) 0.05 % nasal spray Place 1 spray into both nostrils daily as needed for congestion.    . potassium chloride SA (K-DUR) 20 MEQ tablet TAKE 1 TABLET BY MOUTH  DAILY 90 tablet 3  . simvastatin (ZOCOR) 20 MG tablet Take 1 tablet (20 mg total) by mouth at bedtime. 90 tablet 3  . torsemide (DEMADEX) 100 MG tablet Take 1 tablet (100 mg total) by mouth daily. 90 tablet 3   No current facility-administered medications for this visit.     PHYSICAL EXAMINATION: ECOG PERFORMANCE STATUS: 1 - Symptomatic but completely ambulatory  Vitals:   04/28/19 1246  BP: (!) 131/54  Pulse: 65  Resp: 18  Temp: 99.8 F (37.7 C)  SpO2: 91%   There were no vitals filed for this visit.  GENERAL:alert, no distress and comfortable.  Limited exam due to morbid obesity SKIN: skin color, texture, turgor are normal, no rashes or significant lesions EYES: normal, Conjunctiva are pink and non-injected, sclera clear OROPHARYNX:no exudate, no erythema and lips, buccal mucosa, and tongue normal  NECK: supple, thyroid normal size, non-tender, without nodularity LYMPH:  no palpable lymphadenopathy in the cervical, axillary or inguinal LUNGS: clear to auscultation and percussion with normal breathing effort HEART: regular rate & rhythm and no murmurs with moderate bilateral  lower extremity edema ABDOMEN:abdomen soft, non-tender and normal bowel sounds Musculoskeletal:no cyanosis of digits and no clubbing  NEURO: alert & oriented x 3 with fluent speech, no focal motor/sensory deficits  LABORATORY DATA:  I have reviewed the data as listed    Component Value Date/Time   NA 140 04/28/2019 1210   NA 141 01/01/2018 0932   NA 141 04/19/2017 0822   K 3.9  04/28/2019 1210   K 4.1 04/19/2017 0822   CL 93 (L) 04/28/2019 1210   CO2 39 (H) 04/28/2019 1210   CO2 33 (H) 04/19/2017 0822   GLUCOSE 100 (H) 04/28/2019 1210   GLUCOSE 113 04/19/2017 0822   BUN 13 04/28/2019 1210   BUN 14 01/01/2018 0932   BUN 8.7 04/19/2017 0822   CREATININE 0.93 04/28/2019 1210   CREATININE 0.87 07/25/2018 1442   CREATININE 0.7 04/19/2017 0822   CALCIUM 9.6 04/28/2019 1210   CALCIUM 9.5 04/19/2017 0822   PROT 7.3 04/28/2019 1210   PROT 7.1 01/01/2018 0932   PROT 6.8 04/19/2017 0822   ALBUMIN 4.5 04/28/2019 1210   ALBUMIN 4.6 01/01/2018 0932   ALBUMIN 3.8 04/19/2017 0822   AST 18 04/28/2019 1210   AST 16 07/25/2018 1442   AST 18 04/19/2017 0822   ALT 18 04/28/2019 1210   ALT 15 07/25/2018 1442   ALT 21 04/19/2017 0822   ALKPHOS 84 04/28/2019 1210   ALKPHOS 82 04/19/2017 0822   BILITOT 0.4 04/28/2019 1210   BILITOT 0.3 07/25/2018 1442   BILITOT 0.53 04/19/2017 0822   GFRNONAA >60 04/28/2019 1210   GFRNONAA >60 07/25/2018 1442   GFRAA >60 04/28/2019 1210   GFRAA >60 07/25/2018 1442    No results found for: SPEP, UPEP  Lab Results  Component Value Date   WBC 11.2 (H) 04/28/2019   NEUTROABS 8.3 (H) 04/28/2019   HGB 16.3 04/28/2019   HCT 49.4 04/28/2019   MCV 99.8 04/28/2019   PLT 147 (L) 04/28/2019      Chemistry      Component Value Date/Time   NA 140 04/28/2019 1210   NA 141 01/01/2018 0932   NA 141 04/19/2017 0822   K 3.9 04/28/2019 1210   K 4.1 04/19/2017 0822   CL 93 (L) 04/28/2019 1210   CO2 39 (H) 04/28/2019 1210   CO2 33 (H) 04/19/2017 0822   BUN 13 04/28/2019 1210   BUN 14 01/01/2018 0932   BUN 8.7 04/19/2017 0822   CREATININE 0.93 04/28/2019 1210   CREATININE 0.87 07/25/2018 1442   CREATININE 0.7 04/19/2017 0822      Component Value Date/Time   CALCIUM 9.6 04/28/2019 1210   CALCIUM 9.5 04/19/2017 0822   ALKPHOS 84 04/28/2019 1210   ALKPHOS 82 04/19/2017 0822   AST 18 04/28/2019 1210   AST 16 07/25/2018 1442   AST 18  04/19/2017 0822   ALT 18 04/28/2019 1210   ALT 15 07/25/2018 1442   ALT 21 04/19/2017 0822   BILITOT 0.4 04/28/2019 1210   BILITOT 0.3 07/25/2018 1442   BILITOT 0.53 04/19/2017 0822      All questions were answered. The patient knows to call the clinic with any problems, questions or concerns. No barriers to learning was detected.  I spent 15 minutes counseling the patient face to face. The total time spent in the appointment was 20 minutes and more than 50% was on counseling and review of test results  Heath Lark, MD 04/30/2019 10:39 AM

## 2019-04-30 NOTE — Assessment & Plan Note (Signed)
Last CT scan showed no signs of disease. Clinically, he has no signs or symptoms to suggest cancer recurrence I will see him back in 12 months with repeat history, physical examination and blood work I do not recommend routine surveillance imaging study

## 2019-05-15 ENCOUNTER — Other Ambulatory Visit: Payer: Self-pay

## 2019-05-15 ENCOUNTER — Ambulatory Visit (INDEPENDENT_AMBULATORY_CARE_PROVIDER_SITE_OTHER): Payer: BC Managed Care – PPO | Admitting: Internal Medicine

## 2019-05-15 ENCOUNTER — Encounter: Payer: Self-pay | Admitting: Internal Medicine

## 2019-05-15 DIAGNOSIS — I2583 Coronary atherosclerosis due to lipid rich plaque: Secondary | ICD-10-CM

## 2019-05-15 DIAGNOSIS — C8228 Follicular lymphoma grade III, unspecified, lymph nodes of multiple sites: Secondary | ICD-10-CM

## 2019-05-15 DIAGNOSIS — I10 Essential (primary) hypertension: Secondary | ICD-10-CM

## 2019-05-15 DIAGNOSIS — I2510 Atherosclerotic heart disease of native coronary artery without angina pectoris: Secondary | ICD-10-CM

## 2019-05-15 DIAGNOSIS — M545 Low back pain: Secondary | ICD-10-CM | POA: Diagnosis not present

## 2019-05-15 DIAGNOSIS — M79605 Pain in left leg: Secondary | ICD-10-CM

## 2019-05-15 DIAGNOSIS — M79604 Pain in right leg: Secondary | ICD-10-CM

## 2019-05-15 MED ORDER — OXYCODONE-ACETAMINOPHEN 5-325 MG PO TABS: 1.0000 | ORAL_TABLET | Freq: Four times a day (QID) | ORAL | 0 refills | Status: DC | PRN

## 2019-05-15 NOTE — Assessment & Plan Note (Signed)
Percocet prn Not to take w/Clonazepam  Potential benefits of a long term opioids use as well as potential risks (i.e. addiction risk, apnea etc) and complications (i.e. Somnolence, constipation and others) were explained to the patient and were aknowledged. 

## 2019-05-15 NOTE — Assessment & Plan Note (Signed)
  On Torsemide, Hydralazine Refusing CPAP

## 2019-05-15 NOTE — Assessment & Plan Note (Signed)
Better on diet 

## 2019-05-15 NOTE — Progress Notes (Signed)
Subjective:  Patient ID: [name], male    DOB: Oct 27, 1955  Age: 64 y.o. MRN: [id number]  CC: No chief complaint on file.   HPI [name] presents for LBP, lymphoma, CHF f/u   Outpatient Medications Prior to Visit  Medication Sig Dispense Refill   acetaminophen (TYLENOL) 325 MG tablet Take 2 tablets (650 mg total) by mouth every 6 (six) hours as needed for mild pain or headache.     albuterol (PROVENTIL) (2.5 MG/3ML) 0.083% nebulizer solution Take 3 mLs (2.5 mg total) by nebulization daily as needed for wheezing or shortness of breath. 75 mL 11   ANORO ELLIPTA 62.5-25 MCG/INH AEPB Inhale 1 puff into the lungs daily as needed. For wheezing & SOB 180 each 3   aspirin EC 81 MG tablet Take 81 mg by mouth every evening.      clonazePAM (KLONOPIN) 0.5 MG tablet TAKE 1 TABLET TWICE A DAY AS NEEDED FOR ANXIETY OR AT BEDTIME FOR INSOMNIA 60 tablet 3   clopidogrel (PLAVIX) 75 MG tablet Take 1 tablet (75 mg total) by mouth daily with breakfast. 90 tablet 3   metolazone (ZAROXOLYN) 5 MG tablet Take 1 tablet (5 mg total) by mouth daily. 90 tablet 3   metoprolol succinate (TOPROL-XL) 50 MG 24 hr tablet Take 2 tablets (100 mg total) by mouth daily. 180 tablet 3   oxyCODONE-acetaminophen (PERCOCET/ROXICET) 5-325 MG tablet Take 1 tablet by mouth every 6 (six) hours as needed for severe pain. 120 tablet 0   oxyCODONE-acetaminophen (PERCOCET/ROXICET) 5-325 MG tablet Take 1 tablet by mouth every 6 (six) hours as needed for severe pain. Please fill on or after 01/01/19 120 tablet 0   oxyCODONE-acetaminophen (PERCOCET/ROXICET) 5-325 MG tablet Take 1 tablet by mouth every 6 (six) hours as needed for severe pain. 120 tablet 0   oxymetazoline (AFRIN) 0.05 % nasal spray Place 1 spray into both nostrils daily as needed for congestion.     potassium chloride SA (K-DUR) 20 MEQ tablet TAKE 1 TABLET BY MOUTH  DAILY 90 tablet 3   simvastatin (ZOCOR) 20 MG tablet Take 1 tablet (20 mg  total) by mouth at bedtime. 90 tablet 3   torsemide (DEMADEX) 100 MG tablet Take 1 tablet (100 mg total) by mouth daily. 90 tablet 3   nitroGLYCERIN (NITROSTAT) 0.4 MG SL tablet Place 1 tablet (0.4 mg total) under the tongue every 5 (five) minutes as needed for chest pain (Up to 3 doses). 25 tablet 1   No facility-administered medications prior to visit.     ROS: Review of Systems  Constitutional: Positive for fatigue. Negative for appetite change and unexpected weight change.  HENT: Negative for congestion, nosebleeds, sneezing, sore throat and trouble swallowing.   Eyes: Negative for itching and visual disturbance.  Respiratory: Negative for cough.   Cardiovascular: Negative for chest pain, palpitations and leg swelling.  Gastrointestinal: Negative for abdominal distention, blood in stool, diarrhea and nausea.  Genitourinary: Negative for frequency and hematuria.  Musculoskeletal: Positive for arthralgias and back pain. Negative for gait problem, joint swelling and neck pain.  Skin: Negative for rash.  Neurological: Negative for dizziness, tremors, speech difficulty and weakness.  Psychiatric/Behavioral: Negative for agitation, dysphoric mood, sleep disturbance and suicidal ideas. The patient is not nervous/anxious.     Objective:  BP 122/60 (BP Location: Right Arm, Patient Position: Sitting, Cuff Size: Large)    Pulse 66    Temp 99 F (37.2 C) (Oral)    Ht 6' (1.829 m)  Wt (!) 309 lb (140.2 kg)    SpO2 (!) 89%    BMI 41.91 kg/m   BP Readings from Last 3 Encounters:  05/15/19 122/60  04/28/19 (!) 131/54  02/10/19 132/68    Wt Readings from Last 3 Encounters:  05/15/19 (!) 309 lb (140.2 kg)  02/10/19 (!) 321 lb (145.6 kg)  09/18/18 (!) 336 lb (152.4 kg)    Physical Exam Constitutional:      General: He is not in acute distress.    Appearance: He is well-developed.     Comments: NAD  Eyes:     Conjunctiva/sclera: Conjunctivae normal.     Pupils: Pupils are equal,  round, and reactive to light.  Neck:     Musculoskeletal: Normal range of motion.     Thyroid: No thyromegaly.     Vascular: No JVD.  Cardiovascular:     Rate and Rhythm: Normal rate and regular rhythm.     Heart sounds: Normal heart sounds. No murmur. No friction rub. No gallop.   Pulmonary:     Effort: Pulmonary effort is normal. No respiratory distress.     Breath sounds: Normal breath sounds. No wheezing or rales.  Chest:     Chest wall: No tenderness.  Abdominal:     General: Bowel sounds are normal. There is no distension.     Palpations: Abdomen is soft. There is no mass.     Tenderness: There is no abdominal tenderness. There is no guarding or rebound.  Musculoskeletal: Normal range of motion.        General: Tenderness present.  Lymphadenopathy:     Cervical: No cervical adenopathy.  Skin:    General: Skin is warm and dry.     Findings: No rash.  Neurological:     Mental Status: He is alert and oriented to person, place, and time.     Cranial Nerves: No cranial nerve deficit.     Motor: Weakness present. No abnormal muscle tone.     Coordination: Coordination normal.     Gait: Gait abnormal.     Deep Tendon Reflexes: Reflexes are normal and symmetric.  Psychiatric:        Behavior: Behavior normal.        Thought Content: Thought content normal.        Judgment: Judgment normal.   obese LS pain  Lab Results  Component Value Date   WBC 11.2 (H) 04/28/2019   HGB 16.3 04/28/2019   HCT 49.4 04/28/2019   PLT 147 (L) 04/28/2019   GLUCOSE 100 (H) 04/28/2019   CHOL 108 01/01/2018   TRIG 98 01/01/2018   HDL 35 (L) 01/01/2018   LDLCALC 53 01/01/2018   ALT 18 04/28/2019   AST 18 04/28/2019   NA 140 04/28/2019   K 3.9 04/28/2019   CL 93 (L) 04/28/2019   CREATININE 0.93 04/28/2019   BUN 13 04/28/2019   CO2 39 (H) 04/28/2019   TSH 0.88 02/10/2019   PSA 0.23 09/15/2011   INR 1.0 11/28/2017   HGBA1C 6.2 06/19/2018   MICROALBUR 0.7 03/29/2016    Vas US  Carotid  Result Date: 01/21/2019 Carotid Arterial Duplex Study Indications:       Carotid artery disease follow up, history of left                    endarterectomy 2009.                    Patient has no  cerebrovascular symptoms today. Risk Factors:      Hypertension, hyperlipidemia, Diabetes, past history of                    smoking, coronary artery disease. Other Factors:     Patient has severe asthma and COPD. Prior CABG 2005. Comparison Study:  Prior carotid duplex exam done 12/28/2017 showed right mid ICA                    velocity 115/18 cm/sec and left mid ICA 102/26 cm/sec. The                    left subclavian artery had a velocity of 341 cm/sec. Performing Technologist: Salvadore Dom RVT, RDCS (AE), RDMS Supporting Technologist: Wilkie Aye RVT  Examination Guidelines: A complete evaluation includes B-mode imaging, spectral Doppler, color Doppler, and power Doppler as needed of all accessible portions of each vessel. Bilateral testing is considered an integral part of a complete examination. Limited examinations for reoccurring indications may be performed as noted.  Right Carotid Findings: +----------+--------+--------+--------+-------------------------+--------+             PSV cm/s EDV cm/s Stenosis Describe                  Comments  +----------+--------+--------+--------+-------------------------+--------+  CCA Prox   138      13                heterogenous                        +----------+--------+--------+--------+-------------------------+--------+  CCA Mid    93       20                heterogenous                        +----------+--------+--------+--------+-------------------------+--------+  CCA Distal 72       19                                                    +----------+--------+--------+--------+-------------------------+--------+  ICA Prox   105      29                calcific and heterogenous           +----------+--------+--------+--------+-------------------------+--------+   ICA Mid    119      36       1-39%                                        +----------+--------+--------+--------+-------------------------+--------+  ICA Distal 102      28                                                    +----------+--------+--------+--------+-------------------------+--------+  ECA        338      29       >50%                                         +----------+--------+--------+--------+-------------------------+--------+ +----------+--------+-------+----------------+-------------------+  PSV cm/s EDV cms Describe         Arm Pressure (mmHG)  +----------+--------+-------+----------------+-------------------+  Subclavian 220              Multiphasic, WNL 150                  +----------+--------+-------+----------------+-------------------+ +---------+--------+--+--------+--+---------+  Vertebral PSV cm/s 41 EDV cm/s 10 Antegrade  +---------+--------+--+--------+--+---------+  Left Carotid Findings: +----------+--------+--------+--------+------------+-----------------------+             PSV cm/s EDV cm/s Stenosis Describe     Comments                 +----------+--------+--------+--------+------------+-----------------------+  CCA Prox   123      15                                                      +----------+--------+--------+--------+------------+-----------------------+  CCA Mid    97       21                heterogenous                          +----------+--------+--------+--------+------------+-----------------------+  CCA Distal 77       18                                                      +----------+--------+--------+--------+------------+-----------------------+  ICA Prox   80       24                             intimal hyperplasia-CEA  +----------+--------+--------+--------+------------+-----------------------+  ICA Mid    119      33       1-39%                                          +----------+--------+--------+--------+------------+-----------------------+   ICA Distal 93       26                                                      +----------+--------+--------+--------+------------+-----------------------+  ECA        492      46       >50%     heterogenous                          +----------+--------+--------+--------+------------+-----------------------+ +----------+--------+--------+--------+-------------------+  Subclavian PSV cm/s EDV cm/s Describe Arm Pressure (mmHG)  +----------+--------+--------+--------+-------------------+             405               Stenotic 150                  +----------+--------+--------+--------+-------------------+ +---------+--------+--+--------+--+---------+  Vertebral PSV cm/s 87 EDV cm/s 27 Antegrade  +---------+--------+--+--------+--+---------+  Summary:  Right Carotid: Velocities in the right ICA are consistent with a 1-39% stenosis.                The ECA appears >50% stenosed. Left Carotid: Velocities in the left ICA are consistent with a 1-39% stenosis.               The ECA appears >50% stenosed. Vertebrals:  Bilateral vertebral arteries demonstrate antegrade flow. Subclavians: Left subclavian artery was stenotic. Normal flow hemodynamics were              seen in the right subclavian artery. *See table(s) above for measurements and observations. Suggest follow up study in 12 months. Electronically signed by Ena Dawley MD on 01/21/2019 at 6:23:20 PM.    Final     Assessment & Plan:   There are no diagnoses linked to this encounter.   No orders of the defined types were placed in this encounter.    Follow-up: No follow-ups on file.  Walker Kehr, MD

## 2019-05-15 NOTE — Assessment & Plan Note (Signed)
No angina 

## 2019-05-15 NOTE — Assessment & Plan Note (Signed)
Doing well F/u w/Onc

## 2019-05-20 ENCOUNTER — Telehealth: Payer: Self-pay

## 2019-05-20 NOTE — Telephone Encounter (Signed)
Copied from [place] [phone]. Topic: General - Other >> May 19, 2019 11:44 AM Carolyn Stare wrote: Pt cal lto say Dr Alain Marion was suppose to call him in something for his neuropathy and he check with pharmacy and they did not have a RX for him  [place]

## 2019-05-21 MED ORDER — GABAPENTIN 100 MG PO CAPS: 100.0000 mg | ORAL_CAPSULE | Freq: Three times a day (TID) | ORAL | 3 refills | Status: DC

## 2019-05-21 NOTE — Telephone Encounter (Signed)
Pt informed of below.  

## 2019-05-21 NOTE — Addendum Note (Signed)
Addended by: [name] on: 05/21/2019 01:07 PM   Modules accepted: Orders

## 2019-05-21 NOTE — Telephone Encounter (Signed)
We can try Gabapentin Thx 

## 2019-05-23 ENCOUNTER — Other Ambulatory Visit: Payer: Self-pay | Admitting: *Deleted

## 2019-05-23 MED ORDER — ANORO ELLIPTA 62.5-25 MCG/INH IN AEPB: 1.0000 | INHALATION_SPRAY | Freq: Every day | RESPIRATORY_TRACT | 3 refills | Status: DC | PRN

## 2019-05-24 DIAGNOSIS — J449 Chronic obstructive pulmonary disease, unspecified: Secondary | ICD-10-CM | POA: Diagnosis not present

## 2019-06-24 DIAGNOSIS — J449 Chronic obstructive pulmonary disease, unspecified: Secondary | ICD-10-CM | POA: Diagnosis not present

## 2019-06-26 ENCOUNTER — Other Ambulatory Visit: Payer: Self-pay | Admitting: Internal Medicine

## 2019-07-15 ENCOUNTER — Telehealth: Payer: Self-pay | Admitting: Emergency Medicine

## 2019-07-15 MED ORDER — CLOPIDOGREL BISULFATE 75 MG PO TABS: 75.0000 mg | ORAL_TABLET | Freq: Every day | ORAL | 0 refills | Status: DC

## 2019-07-15 MED ORDER — CLOPIDOGREL BISULFATE 75 MG PO TABS: 75.0000 mg | ORAL_TABLET | Freq: Every day | ORAL | 1 refills | Status: DC

## 2019-07-15 NOTE — Telephone Encounter (Signed)
RX sent

## 2019-07-15 NOTE — Telephone Encounter (Signed)
Pt came into office to update new insurance. Scanned insurance and RX cards into documents. He needs a refill on his Glopidegrel. He would like his refill sent into [facility] Fax #: [phone], Phone #: [phone]. He asked if you can also call a 30 day supply into [facility] on [address] until everything gets settled since he is out of the medication. Please advise thanks.

## 2019-08-12 ENCOUNTER — Other Ambulatory Visit: Payer: Self-pay | Admitting: Internal Medicine

## 2019-08-20 ENCOUNTER — Ambulatory Visit: Payer: Medicare Other | Attending: Internal Medicine

## 2019-08-20 DIAGNOSIS — Z20822 Contact with and (suspected) exposure to covid-19: Secondary | ICD-10-CM

## 2019-08-21 ENCOUNTER — Ambulatory Visit (INDEPENDENT_AMBULATORY_CARE_PROVIDER_SITE_OTHER): Payer: Medicare Other | Admitting: Internal Medicine

## 2019-08-21 ENCOUNTER — Other Ambulatory Visit: Payer: Self-pay

## 2019-08-21 DIAGNOSIS — I5032 Chronic diastolic (congestive) heart failure: Secondary | ICD-10-CM

## 2019-08-21 DIAGNOSIS — J449 Chronic obstructive pulmonary disease, unspecified: Secondary | ICD-10-CM

## 2019-08-21 DIAGNOSIS — E1151 Type 2 diabetes mellitus with diabetic peripheral angiopathy without gangrene: Secondary | ICD-10-CM

## 2019-08-21 DIAGNOSIS — I2510 Atherosclerotic heart disease of native coronary artery without angina pectoris: Secondary | ICD-10-CM

## 2019-08-21 DIAGNOSIS — Z9861 Coronary angioplasty status: Secondary | ICD-10-CM

## 2019-08-21 MED ORDER — SIMVASTATIN 20 MG PO TABS: 20.0000 mg | ORAL_TABLET | Freq: Every day | ORAL | 3 refills | Status: DC

## 2019-08-21 MED ORDER — ALBUTEROL SULFATE (2.5 MG/3ML) 0.083% IN NEBU: 2.5000 mg | INHALATION_SOLUTION | Freq: Every day | RESPIRATORY_TRACT | 11 refills | Status: DC | PRN

## 2019-08-21 MED ORDER — OXYCODONE-ACETAMINOPHEN 5-325 MG PO TABS: 1.0000 | ORAL_TABLET | Freq: Four times a day (QID) | ORAL | 0 refills | Status: DC | PRN

## 2019-08-22 LAB — NOVEL CORONAVIRUS, NAA: SARS-CoV-2, NAA: NOT DETECTED

## 2019-08-25 ENCOUNTER — Encounter: Payer: Self-pay | Admitting: Internal Medicine

## 2019-08-25 NOTE — Assessment & Plan Note (Signed)
Diet and weight loss discussed

## 2019-08-25 NOTE — Assessment & Plan Note (Addendum)
Continue with torsemide, hydralazine, Zaroxolyn Use CPAP

## 2019-08-25 NOTE — Progress Notes (Signed)
Virtual Visit via Telephone Note  I connected with [name] on 08/25/19 at  3:00 PM EST by telephone and verified that I am speaking with the correct person using two identifiers.   I discussed the limitations, risks, security and privacy concerns of performing an evaluation and management service by telephone and the availability of in person appointments. I also discussed with the patient that there may be a patient responsible charge related to this service. The patient expressed understanding and agreed to proceed.   History of Present Illness: Follow-up on chronic low back pain, coronary artery disease, COPD   Observations/Objective:  He sounds normal on the phone Assessment and Plan:  See problem list Follow Up Instructions:    I discussed the assessment and treatment plan with the patient. The patient was provided an opportunity to ask questions and all were answered. The patient agreed with the plan and demonstrated an understanding of the instructions.   The patient was advised to call back or seek an in-person evaluation if the symptoms worsen or if the condition fails to improve as anticipated.  I provided 21 minutes of non-face-to-face time during this encounter.   Walker Kehr, MD

## 2019-08-25 NOTE — Assessment & Plan Note (Signed)
ANORO ELLIPTA daily Proventil as needed 

## 2019-08-25 NOTE — Assessment & Plan Note (Signed)
Continue with simvastatin, Plavix and baby aspirin

## 2019-09-04 MED ORDER — SIMVASTATIN 20 MG PO TABS: 20.0000 mg | ORAL_TABLET | Freq: Every day | ORAL | 3 refills | Status: DC

## 2019-09-14 MED ORDER — CLONAZEPAM 0.5 MG PO TABS: ORAL_TABLET | ORAL | 3 refills | Status: DC

## 2019-09-16 ENCOUNTER — Telehealth: Payer: Self-pay | Admitting: Internal Medicine

## 2019-09-16 NOTE — Telephone Encounter (Signed)
Patient states he was advised to call his doctor to get clearance for the COVID Vac.

## 2019-09-17 NOTE — Telephone Encounter (Signed)
Is okay to get vaccinated.  Thanks

## 2019-09-18 NOTE — Telephone Encounter (Signed)
Notified pt w/MD response.../lmb 

## 2019-09-30 ENCOUNTER — Other Ambulatory Visit: Payer: Self-pay | Admitting: Internal Medicine

## 2019-09-30 MED ORDER — OXYCODONE-ACETAMINOPHEN 5-325 MG PO TABS: 1.0000 | ORAL_TABLET | Freq: Four times a day (QID) | ORAL | 0 refills | Status: DC | PRN

## 2019-11-04 NOTE — Telephone Encounter (Signed)
Duplicate request.. see 1st email.Marland KitchenJohny Blevins

## 2019-11-09 MED ORDER — OXYCODONE-ACETAMINOPHEN 5-325 MG PO TABS: 1.0000 | ORAL_TABLET | Freq: Four times a day (QID) | ORAL | 0 refills | Status: DC | PRN

## 2019-11-17 ENCOUNTER — Encounter: Payer: Self-pay | Admitting: Internal Medicine

## 2019-11-17 ENCOUNTER — Ambulatory Visit (INDEPENDENT_AMBULATORY_CARE_PROVIDER_SITE_OTHER): Payer: Medicare Other | Admitting: Internal Medicine

## 2019-11-17 ENCOUNTER — Other Ambulatory Visit: Payer: Self-pay

## 2019-11-17 DIAGNOSIS — E1151 Type 2 diabetes mellitus with diabetic peripheral angiopathy without gangrene: Secondary | ICD-10-CM | POA: Diagnosis not present

## 2019-11-17 DIAGNOSIS — I2583 Coronary atherosclerosis due to lipid rich plaque: Secondary | ICD-10-CM

## 2019-11-17 DIAGNOSIS — Z9861 Coronary angioplasty status: Secondary | ICD-10-CM

## 2019-11-17 DIAGNOSIS — I2510 Atherosclerotic heart disease of native coronary artery without angina pectoris: Secondary | ICD-10-CM

## 2019-11-17 DIAGNOSIS — I5032 Chronic diastolic (congestive) heart failure: Secondary | ICD-10-CM

## 2019-11-17 DIAGNOSIS — M79605 Pain in left leg: Secondary | ICD-10-CM

## 2019-11-17 DIAGNOSIS — M545 Low back pain: Secondary | ICD-10-CM | POA: Diagnosis not present

## 2019-11-17 DIAGNOSIS — M79604 Pain in right leg: Secondary | ICD-10-CM

## 2019-11-17 MED ORDER — METOPROLOL SUCCINATE ER 50 MG PO TB24: 100.0000 mg | ORAL_TABLET | Freq: Every day | ORAL | 3 refills | Status: DC

## 2019-11-17 MED ORDER — CLONAZEPAM 0.5 MG PO TABS: ORAL_TABLET | ORAL | 3 refills | Status: DC

## 2019-11-17 NOTE — Assessment & Plan Note (Signed)
Not smoking.  

## 2019-11-17 NOTE — Assessment & Plan Note (Signed)
Percocet prn Not to take w/Clonazepam  Potential benefits of a long term opioids use as well as potential risks (i.e. addiction risk, apnea etc) and complications (i.e. Somnolence, constipation and others) were explained to the patient and were aknowledged. 

## 2019-11-17 NOTE — Assessment & Plan Note (Addendum)
On torsemide, hydralazine, Zaroxolyn Use CPAP - Refusing CPAP NAS diet Using O2 2 l/min [place] - at night

## 2019-11-17 NOTE — Assessment & Plan Note (Signed)
Labs

## 2019-11-17 NOTE — Progress Notes (Signed)
Subjective:  Patient ID: [name], male    DOB: 12-13-1955  Age: 64 y.o. MRN: [id number]  CC: No chief complaint on file.   HPI Radhames Esmaili presents for LBP, edema, CHF f/u  Outpatient Medications Prior to Visit  Medication Sig Dispense Refill  . acetaminophen (TYLENOL) 325 MG tablet Take 2 tablets (650 mg total) by mouth every 6 (six) hours as needed for mild pain or headache.    . albuterol (PROVENTIL) (2.5 MG/3ML) 0.083% nebulizer solution Take 3 mLs (2.5 mg total) by nebulization daily as needed for wheezing or shortness of breath. 75 mL 11  . ANORO ELLIPTA 62.5-25 MCG/INH AEPB Inhale 1 puff into the lungs daily as needed. For wheezing & SOB 180 each 3  . aspirin EC 81 MG tablet Take 81 mg by mouth every evening.     . clonazePAM (KLONOPIN) 0.5 MG tablet TAKE 1 TABLET TWICE A DAY AS NEEDED FOR ANXIETY OR AT BEDTIME FOR INSOMNIA 60 tablet 3  . clopidogrel (PLAVIX) 75 MG tablet TAKE 1 TABLET BY MOUTH EVERY DAY WITH BREAKFAST 30 tablet 5  . gabapentin (NEURONTIN) 100 MG capsule Take 1 capsule (100 mg total) by mouth 3 (three) times daily. 90 capsule 3  . metolazone (ZAROXOLYN) 5 MG tablet Take 1 tablet (5 mg total) by mouth daily. 90 tablet 3  . metoprolol succinate (TOPROL-XL) 50 MG 24 hr tablet Take 2 tablets (100 mg total) by mouth daily. 180 tablet 3  . oxyCODONE-acetaminophen (PERCOCET/ROXICET) 5-325 MG tablet Take 1 tablet by mouth every 6 (six) hours as needed for severe pain. Please fill on or after 06/18/19 120 tablet 0  . oxyCODONE-acetaminophen (PERCOCET/ROXICET) 5-325 MG tablet Take 1 tablet by mouth every 6 (six) hours as needed for severe pain. 120 tablet 0  . oxyCODONE-acetaminophen (PERCOCET/ROXICET) 5-325 MG tablet Take 1 tablet by mouth every 6 (six) hours as needed for severe pain. 120 tablet 0  . oxymetazoline (AFRIN) 0.05 % nasal spray Place 1 spray into both nostrils daily as needed for congestion.    . potassium chloride SA (K-DUR) 20 MEQ tablet  TAKE 1 TABLET BY MOUTH  DAILY 90 tablet 3  . simvastatin (ZOCOR) 20 MG tablet Take 1 tablet (20 mg total) by mouth at bedtime. 90 tablet 3  . torsemide (DEMADEX) 100 MG tablet Take 1 tablet (100 mg total) by mouth daily. 90 tablet 3  . nitroGLYCERIN (NITROSTAT) 0.4 MG SL tablet Place 1 tablet (0.4 mg total) under the tongue every 5 (five) minutes as needed for chest pain (Up to 3 doses). 25 tablet 1   No facility-administered medications prior to visit.    ROS: Review of Systems  Constitutional: Negative for appetite change, fatigue and unexpected weight change.  HENT: Negative for congestion, nosebleeds, sneezing, sore throat and trouble swallowing.   Eyes: Negative for itching and visual disturbance.  Respiratory: Negative for cough.   Cardiovascular: Negative for chest pain, palpitations and leg swelling.  Gastrointestinal: Negative for abdominal distention, blood in stool, diarrhea and nausea.  Genitourinary: Negative for frequency and hematuria.  Musculoskeletal: Positive for arthralgias, back pain and gait problem. Negative for joint swelling and neck pain.  Skin: Positive for color change and rash.  Neurological: Negative for dizziness, tremors, speech difficulty and weakness.  Hematological: Bruises/bleeds easily.  Psychiatric/Behavioral: Negative for agitation, dysphoric mood and sleep disturbance. The patient is not nervous/anxious.     Objective:  BP (!) 162/78 (BP Location: Left Arm, Patient Position: Sitting, Cuff Size: Large)  Pulse (!) 54   Temp 98.2 F (36.8 C) (Oral)   Ht 6' (1.829 m)   Wt (!) 306 lb 6 oz (139 kg)   SpO2 93%   BMI 41.55 kg/m   BP Readings from Last 3 Encounters:  11/17/19 (!) 162/78  05/15/19 122/60  04/28/19 (!) 131/54    Wt Readings from Last 3 Encounters:  11/17/19 (!) 306 lb 6 oz (139 kg)  05/15/19 (!) 309 lb (140.2 kg)  02/10/19 (!) 321 lb (145.6 kg)    Physical Exam Constitutional:      General: He is not in acute distress.     Appearance: He is well-developed.     Comments: NAD  Eyes:     Conjunctiva/sclera: Conjunctivae normal.     Pupils: Pupils are equal, round, and reactive to light.  Neck:     Thyroid: No thyromegaly.     Vascular: No JVD.  Cardiovascular:     Rate and Rhythm: Normal rate and regular rhythm.     Heart sounds: Normal heart sounds. No murmur. No friction rub. No gallop.   Pulmonary:     Effort: Pulmonary effort is normal. No respiratory distress.     Breath sounds: Normal breath sounds. No wheezing or rales.  Chest:     Chest wall: No tenderness.  Abdominal:     General: Bowel sounds are normal. There is no distension.     Palpations: Abdomen is soft. There is no mass.     Tenderness: There is no abdominal tenderness. There is no guarding or rebound.  Musculoskeletal:        General: Tenderness present. Normal range of motion.     Cervical back: Normal range of motion.     Right lower leg: Edema present.     Left lower leg: Edema present.  Lymphadenopathy:     Cervical: No cervical adenopathy.  Skin:    General: Skin is warm and dry.     Findings: Bruising present. No rash.  Neurological:     Mental Status: He is alert and oriented to person, place, and time.     Cranial Nerves: No cranial nerve deficit.     Motor: No abnormal muscle tone.     Coordination: Coordination normal.     Gait: Gait abnormal.     Deep Tendon Reflexes: Reflexes are normal and symmetric.  Psychiatric:        Behavior: Behavior normal.        Thought Content: Thought content normal.        Judgment: Judgment normal.   B LE w/1+ edema, brown discoloration LS tender   Lab Results  Component Value Date   WBC 11.2 (H) 04/28/2019   HGB 16.3 04/28/2019   HCT 49.4 04/28/2019   PLT 147 (L) 04/28/2019   GLUCOSE 100 (H) 04/28/2019   CHOL 108 01/01/2018   TRIG 98 01/01/2018   HDL 35 (L) 01/01/2018   LDLCALC 53 01/01/2018   ALT 18 04/28/2019   AST 18 04/28/2019   NA 140 04/28/2019   K 3.9  04/28/2019   CL 93 (L) 04/28/2019   CREATININE 0.93 04/28/2019   BUN 13 04/28/2019   CO2 39 (H) 04/28/2019   TSH 0.88 02/10/2019   PSA 0.23 09/15/2011   INR 1.0 11/28/2017   HGBA1C 6.2 06/19/2018   MICROALBUR 0.7 03/29/2016    VAS US CAROTID  Result Date: 01/21/2019 Carotid Arterial Duplex Study Indications:       Carotid artery disease follow up,  history of left                    endarterectomy 2009.                    Patient has no cerebrovascular symptoms today. Risk Factors:      Hypertension, hyperlipidemia, Diabetes, past history of                    smoking, coronary artery disease. Other Factors:     Patient has severe asthma and COPD. Prior CABG 2005. Comparison Study:  Prior carotid duplex exam done 12/28/2017 showed right mid ICA                    velocity 115/18 cm/sec and left mid ICA 102/26 cm/sec. The                    left subclavian artery had a velocity of 341 cm/sec. Performing Technologist: Salvadore Dom RVT, RDCS (AE), RDMS Supporting Technologist: Wilkie Aye RVT  Examination Guidelines: A complete evaluation includes B-mode imaging, spectral Doppler, color Doppler, and power Doppler as needed of all accessible portions of each vessel. Bilateral testing is considered an integral part of a complete examination. Limited examinations for reoccurring indications may be performed as noted.  Right Carotid Findings: +----------+--------+--------+--------+-------------------------+--------+           PSV cm/sEDV cm/sStenosisDescribe                 Comments +----------+--------+--------+--------+-------------------------+--------+ CCA Prox  138     13              heterogenous                      +----------+--------+--------+--------+-------------------------+--------+ CCA Mid   93      20              heterogenous                      +----------+--------+--------+--------+-------------------------+--------+ CCA Distal72      19                                                 +----------+--------+--------+--------+-------------------------+--------+ ICA Prox  105     29              calcific and heterogenous         +----------+--------+--------+--------+-------------------------+--------+ ICA Mid   119     36      1-39%                                     +----------+--------+--------+--------+-------------------------+--------+ ICA Distal102     28                                                +----------+--------+--------+--------+-------------------------+--------+ ECA       338     29      >50%                                      +----------+--------+--------+--------+-------------------------+--------+ +----------+--------+-------+----------------+-------------------+  PSV cm/sEDV cmsDescribe        Arm Pressure (mmHG) +----------+--------+-------+----------------+-------------------+ Subclavian220            Multiphasic, [id number]                 +----------+--------+-------+----------------+-------------------+ +---------+--------+--+--------+--+---------+ VertebralPSV cm/s41EDV cm/s10Antegrade +---------+--------+--+--------+--+---------+  Left Carotid Findings: +----------+--------+--------+--------+------------+-----------------------+           PSV cm/sEDV cm/sStenosisDescribe    Comments                +----------+--------+--------+--------+------------+-----------------------+ CCA Prox  123     15                                                  +----------+--------+--------+--------+------------+-----------------------+ CCA Mid   97      21              heterogenous                        +----------+--------+--------+--------+------------+-----------------------+ CCA Distal77      18                                                  +----------+--------+--------+--------+------------+-----------------------+ ICA Prox  80      24                          intimal  hyperplasia-CEA +----------+--------+--------+--------+------------+-----------------------+ ICA Mid   119     33      1-39%                                       +----------+--------+--------+--------+------------+-----------------------+ ICA Distal93      26                                                  +----------+--------+--------+--------+------------+-----------------------+ ECA       492     46      >50%    heterogenous                        +----------+--------+--------+--------+------------+-----------------------+ +----------+--------+--------+--------+-------------------+ SubclavianPSV cm/sEDV cm/sDescribeArm Pressure (mmHG) +----------+--------+--------+--------+-------------------+           405             Stenotic150                 +----------+--------+--------+--------+-------------------+ +---------+--------+--+--------+--+---------+ VertebralPSV cm/s87EDV cm/s27Antegrade +---------+--------+--+--------+--+---------+  Summary: Right Carotid: Velocities in the right ICA are consistent with a 1-39% stenosis.                The ECA appears >50% stenosed. Left Carotid: Velocities in the left ICA are consistent with a 1-39% stenosis.               The ECA appears >50% stenosed. Vertebrals:  Bilateral vertebral arteries demonstrate antegrade flow. Subclavians: Left subclavian artery was stenotic. Normal flow hemodynamics were  seen in the right subclavian artery. *See table(s) above for measurements and observations. Suggest follow up study in 12 months. Electronically signed by Ena Dawley MD on 01/21/2019 at 6:23:20 PM.    Final     Assessment & Plan:   There are no diagnoses linked to this encounter.   No orders of the defined types were placed in this encounter.    Follow-up: No follow-ups on file.  Walker Kehr, MD

## 2019-12-09 ENCOUNTER — Other Ambulatory Visit: Payer: Self-pay | Admitting: Internal Medicine

## 2019-12-09 MED ORDER — OXYCODONE-ACETAMINOPHEN 5-325 MG PO TABS: 1.0000 | ORAL_TABLET | Freq: Four times a day (QID) | ORAL | 0 refills | Status: DC | PRN

## 2020-01-12 ENCOUNTER — Other Ambulatory Visit: Payer: Self-pay | Admitting: Internal Medicine

## 2020-01-12 MED ORDER — OXYCODONE-ACETAMINOPHEN 5-325 MG PO TABS: 1.0000 | ORAL_TABLET | Freq: Four times a day (QID) | ORAL | 0 refills | Status: DC | PRN

## 2020-01-22 ENCOUNTER — Telehealth: Payer: Self-pay | Admitting: Cardiovascular Disease

## 2020-01-22 ENCOUNTER — Ambulatory Visit (HOSPITAL_COMMUNITY)
Admission: RE | Admit: 2020-01-22 | Discharge: 2020-01-22 | Disposition: A | Discharge status: Home / Self Care | Payer: Medicare Other | Source: Ambulatory Visit | Attending: Internal Medicine | Admitting: Internal Medicine

## 2020-01-22 ENCOUNTER — Other Ambulatory Visit: Payer: Self-pay

## 2020-01-22 ENCOUNTER — Other Ambulatory Visit (HOSPITAL_COMMUNITY): Payer: Self-pay | Admitting: Cardiovascular Disease

## 2020-01-22 DIAGNOSIS — I6523 Occlusion and stenosis of bilateral carotid arteries: Secondary | ICD-10-CM | POA: Insufficient documentation

## 2020-01-22 DIAGNOSIS — Z9889 Other specified postprocedural states: Secondary | ICD-10-CM

## 2020-01-22 NOTE — Telephone Encounter (Signed)
Patient aware of results.

## 2020-01-22 NOTE — Telephone Encounter (Signed)
Patient returning call for carotid test results. 

## 2020-01-28 ENCOUNTER — Other Ambulatory Visit: Payer: Self-pay | Admitting: *Deleted

## 2020-01-28 MED ORDER — ANORO ELLIPTA 62.5-25 MCG/INH IN AEPB: 1.0000 | INHALATION_SPRAY | Freq: Every day | RESPIRATORY_TRACT | 3 refills | Status: DC | PRN

## 2020-02-17 ENCOUNTER — Ambulatory Visit (INDEPENDENT_AMBULATORY_CARE_PROVIDER_SITE_OTHER): Payer: Medicare Other | Admitting: Internal Medicine

## 2020-02-17 ENCOUNTER — Ambulatory Visit (INDEPENDENT_AMBULATORY_CARE_PROVIDER_SITE_OTHER): Payer: Medicare Other

## 2020-02-17 ENCOUNTER — Encounter: Payer: Self-pay | Admitting: Internal Medicine

## 2020-02-17 ENCOUNTER — Other Ambulatory Visit: Payer: Self-pay

## 2020-02-17 VITALS — BP 130/62 | HR 56 | Temp 98.5°F | Wt 280.0 lb

## 2020-02-17 DIAGNOSIS — M545 Low back pain: Secondary | ICD-10-CM | POA: Diagnosis not present

## 2020-02-17 DIAGNOSIS — E1151 Type 2 diabetes mellitus with diabetic peripheral angiopathy without gangrene: Secondary | ICD-10-CM

## 2020-02-17 DIAGNOSIS — I6523 Occlusion and stenosis of bilateral carotid arteries: Secondary | ICD-10-CM | POA: Diagnosis not present

## 2020-02-17 DIAGNOSIS — J449 Chronic obstructive pulmonary disease, unspecified: Secondary | ICD-10-CM

## 2020-02-17 DIAGNOSIS — R634 Abnormal weight loss: Secondary | ICD-10-CM

## 2020-02-17 DIAGNOSIS — R609 Edema, unspecified: Secondary | ICD-10-CM

## 2020-02-17 DIAGNOSIS — Z720 Tobacco use: Secondary | ICD-10-CM

## 2020-02-17 DIAGNOSIS — M79605 Pain in left leg: Secondary | ICD-10-CM

## 2020-02-17 LAB — URINALYSIS
Bilirubin Urine: NEGATIVE
Hgb urine dipstick: NEGATIVE
Ketones, ur: NEGATIVE
Leukocytes,Ua: NEGATIVE
Nitrite: NEGATIVE
Specific Gravity, Urine: 1.01 (ref 1.000–1.030)
Total Protein, Urine: NEGATIVE
Urine Glucose: NEGATIVE
Urobilinogen, UA: 1 (ref 0.0–1.0)
pH: 7.5 (ref 5.0–8.0)

## 2020-02-17 LAB — CBC WITH DIFFERENTIAL/PLATELET
Basophils Absolute: 0.1 10*3/uL (ref 0.0–0.1)
Basophils Relative: 0.8 % (ref 0.0–3.0)
Eosinophils Absolute: 0.6 10*3/uL (ref 0.0–0.7)
Eosinophils Relative: 6.8 % — ABNORMAL HIGH (ref 0.0–5.0)
HCT: 45.4 % (ref 39.0–52.0)
Hemoglobin: 15.6 g/dL (ref 13.0–17.0)
Lymphocytes Relative: 10.1 % — ABNORMAL LOW (ref 12.0–46.0)
Lymphs Abs: 0.9 10*3/uL (ref 0.7–4.0)
MCHC: 34.4 g/dL (ref 30.0–36.0)
MCV: 99.7 fl (ref 78.0–100.0)
Monocytes Absolute: 0.9 10*3/uL (ref 0.1–1.0)
Monocytes Relative: 10.2 % (ref 3.0–12.0)
Neutro Abs: 6.3 10*3/uL (ref 1.4–7.7)
Neutrophils Relative %: 72.1 % (ref 43.0–77.0)
Platelets: 127 10*3/uL — ABNORMAL LOW (ref 150.0–400.0)
RBC: 4.56 Mil/uL (ref 4.22–5.81)
RDW: 13.2 % (ref 11.5–15.5)
WBC: 8.8 10*3/uL (ref 4.0–10.5)

## 2020-02-17 LAB — BASIC METABOLIC PANEL
BUN: 9 mg/dL (ref 6–23)
CO2: 36 mEq/L — ABNORMAL HIGH (ref 19–32)
Calcium: 9.9 mg/dL (ref 8.4–10.5)
Chloride: 97 mEq/L (ref 96–112)
Creatinine, Ser: 0.77 mg/dL (ref 0.40–1.50)
GFR: 101.08 mL/min (ref >60.00)
Glucose, Bld: 135 mg/dL — ABNORMAL HIGH (ref 70–99)
Potassium: 4.4 mEq/L (ref 3.5–5.1)
Sodium: 140 mEq/L (ref 135–145)

## 2020-02-17 LAB — HEPATIC FUNCTION PANEL
ALT: 16 U/L (ref 0–53)
AST: 19 U/L (ref 0–37)
Albumin: 4.8 g/dL (ref 3.5–5.2)
Alkaline Phosphatase: 79 U/L (ref 39–117)
Bilirubin, Direct: 0.2 mg/dL (ref 0.0–0.3)
Total Bilirubin: 0.7 mg/dL (ref 0.2–1.2)
Total Protein: 7.6 g/dL (ref 6.0–8.3)

## 2020-02-17 LAB — HEMOGLOBIN A1C: Hgb A1c MFr Bld: 5.6 % (ref 4.6–6.5)

## 2020-02-17 LAB — TSH: TSH: 0.8 u[IU]/mL (ref 0.35–4.50)

## 2020-02-17 MED ORDER — OXYCODONE-ACETAMINOPHEN 5-325 MG PO TABS: 1.0000 | ORAL_TABLET | Freq: Four times a day (QID) | ORAL | 0 refills | Status: DC | PRN

## 2020-02-17 NOTE — Assessment & Plan Note (Signed)
Wt Readings from Last 3 Encounters:  02/17/20 280 lb (127 kg)  11/17/19 (!) 306 lb 6 oz (139 kg)  05/15/19 (!) 309 lb (140.2 kg)   ?intentional [facility]

## 2020-02-17 NOTE — Progress Notes (Signed)
Subjective:  Patient ID: [name], male    DOB: 1956-01-13  Age: 64 y.o. MRN: [id number]  CC: Follow-up (3 MONTH FOLLOW-UP- Pt want to discuss the metalazolone. He states he stop taking not sure who rx. Requesting refill on his Oxycodone)   HPI [name] presents for LBP, lymphoma, COPD f/u Pt lost wt on diet...  Outpatient Medications Prior to Visit  Medication Sig Dispense Refill  . acetaminophen (TYLENOL) 325 MG tablet Take 2 tablets (650 mg total) by mouth every 6 (six) hours as needed for mild pain or headache.    . albuterol (PROVENTIL) (2.5 MG/3ML) 0.083% nebulizer solution Take 3 mLs (2.5 mg total) by nebulization daily as needed for wheezing or shortness of breath. 75 mL 11  . ANORO ELLIPTA 62.5-25 MCG/INH AEPB Inhale 1 puff into the lungs daily as needed. For wheezing & SOB 180 each 3  . aspirin EC 81 MG tablet Take 81 mg by mouth every evening.     . clonazePAM (KLONOPIN) 0.5 MG tablet TAKE 1 TABLET TWICE A DAY AS NEEDED FOR ANXIETY OR AT BEDTIME FOR INSOMNIA 60 tablet 3  . clopidogrel (PLAVIX) 75 MG tablet TAKE 1 TABLET BY MOUTH EVERY DAY WITH BREAKFAST 30 tablet 5  . gabapentin (NEURONTIN) 100 MG capsule Take 1 capsule (100 mg total) by mouth 3 (three) times daily. 90 capsule 3  . metoprolol succinate (TOPROL-XL) 50 MG 24 hr tablet Take 2 tablets (100 mg total) by mouth daily. 180 tablet 3  . oxyCODONE-acetaminophen (PERCOCET/ROXICET) 5-325 MG tablet Take 1 tablet by mouth every 6 (six) hours as needed for severe pain. Please fill on or after 06/18/19 120 tablet 0  . oxymetazoline (AFRIN) 0.05 % nasal spray Place 1 spray into both nostrils daily as needed for congestion.    . potassium chloride SA (K-DUR) 20 MEQ tablet TAKE 1 TABLET BY MOUTH  DAILY 90 tablet 3  . simvastatin (ZOCOR) 20 MG tablet Take 1 tablet (20 mg total) by mouth at bedtime. 90 tablet 3  . torsemide (DEMADEX) 100 MG tablet Take 1 tablet (100 mg total) by mouth daily. 90 tablet 3  .  metolazone (ZAROXOLYN) 5 MG tablet Take 1 tablet (5 mg total) by mouth daily. (Patient not taking: Reported on 02/17/2020) 90 tablet 3  . nitroGLYCERIN (NITROSTAT) 0.4 MG SL tablet Place 1 tablet (0.4 mg total) under the tongue every 5 (five) minutes as needed for chest pain (Up to 3 doses). 25 tablet 1  . oxyCODONE-acetaminophen (PERCOCET/ROXICET) 5-325 MG tablet Take 1 tablet by mouth every 6 (six) hours as needed for severe pain. 120 tablet 0  . oxyCODONE-acetaminophen (PERCOCET/ROXICET) 5-325 MG tablet Take 1 tablet by mouth every 6 (six) hours as needed for severe pain. 120 tablet 0   No facility-administered medications prior to visit.    ROS: Review of Systems  Constitutional: Positive for unexpected weight change. Negative for appetite change and fatigue.  HENT: Negative for congestion, nosebleeds, sneezing, sore throat and trouble swallowing.   Eyes: Negative for itching and visual disturbance.  Respiratory: Negative for cough.   Cardiovascular: Positive for leg swelling. Negative for chest pain and palpitations.  Gastrointestinal: Negative for abdominal distention, blood in stool, diarrhea and nausea.  Genitourinary: Negative for frequency and hematuria.  Musculoskeletal: Positive for arthralgias, back pain and gait problem. Negative for joint swelling and neck pain.  Skin: Positive for color change. Negative for rash.  Neurological: Negative for dizziness, tremors, speech difficulty and weakness.  Psychiatric/Behavioral: Negative  for agitation, decreased concentration, dysphoric mood, sleep disturbance and suicidal ideas. The patient is not nervous/anxious.     Objective:  BP 130/62 (BP Location: Left Arm)   Pulse (!) 56   Temp 98.5 F (36.9 C) (Oral)   Wt 280 lb (127 kg)   SpO2 96%   BMI 37.97 kg/m   BP Readings from Last 3 Encounters:  02/17/20 130/62  11/17/19 (!) 162/78  05/15/19 122/60    Wt Readings from Last 3 Encounters:  02/17/20 280 lb (127 kg)  11/17/19  (!) 306 lb 6 oz (139 kg)  05/15/19 (!) 309 lb (140.2 kg)    Physical Exam Constitutional:      General: He is not in acute distress.    Appearance: He is well-developed. He is obese.     Comments: NAD  Eyes:     Conjunctiva/sclera: Conjunctivae normal.     Pupils: Pupils are equal, round, and reactive to light.  Neck:     Thyroid: No thyromegaly.     Vascular: No JVD.  Cardiovascular:     Rate and Rhythm: Normal rate and regular rhythm.     Heart sounds: Normal heart sounds. No murmur heard.  No friction rub. No gallop.   Pulmonary:     Effort: Pulmonary effort is normal. No respiratory distress.     Breath sounds: Normal breath sounds. No wheezing or rales.  Chest:     Chest wall: No tenderness.  Abdominal:     General: Bowel sounds are normal. There is no distension.     Palpations: Abdomen is soft. There is no mass.     Tenderness: There is no abdominal tenderness. There is no guarding or rebound.  Musculoskeletal:        General: No tenderness. Normal range of motion.     Cervical back: Normal range of motion.     Right lower leg: Edema present.     Left lower leg: Edema present.  Lymphadenopathy:     Cervical: No cervical adenopathy.  Skin:    General: Skin is warm and dry.     Findings: Bruising present. No rash.  Neurological:     Mental Status: He is alert and oriented to person, place, and time.     Cranial Nerves: No cranial nerve deficit.     Motor: No abnormal muscle tone.     Coordination: Coordination normal.     Gait: Gait abnormal.     Deep Tendon Reflexes: Reflexes are normal and symmetric.  Psychiatric:        Behavior: Behavior normal.        Thought Content: Thought content normal.        Judgment: Judgment normal.   hyperpigmented LEs bruises Mild rhonchi B   Lab Results  Component Value Date   WBC 11.2 (H) 04/28/2019   HGB 16.3 04/28/2019   HCT 49.4 04/28/2019   PLT 147 (L) 04/28/2019   GLUCOSE 100 (H) 04/28/2019   CHOL 108  01/01/2018   TRIG 98 01/01/2018   HDL 35 (L) 01/01/2018   LDLCALC 53 01/01/2018   ALT 18 04/28/2019   AST 18 04/28/2019   NA 140 04/28/2019   K 3.9 04/28/2019   CL 93 (L) 04/28/2019   CREATININE 0.93 04/28/2019   BUN 13 04/28/2019   CO2 39 (H) 04/28/2019   TSH 0.88 02/10/2019   PSA 0.23 09/15/2011   INR 1.0 11/28/2017   HGBA1C 6.2 06/19/2018   MICROALBUR 0.7 03/29/2016    VAS  US CAROTID  Result Date: 01/22/2020 Carotid Arterial Duplex Study Indications:       Left endarterectomy and bilateral carotid artery stenosis.                    Patient denies any cerebrovascular symptoms. Risk Factors:      Hypertension, hyperlipidemia, Diabetes, past history of                    smoking, coronary artery disease. Other Factors:     CABG, COPD. Comparison Study:  In 01/2019, a carotid duplex performed at showed velocities                    of 119/36 cm/s in the RICA and 409/73 cm/s in the LICA. Performing Technologist: Sharlett Iles RVT  Examination Guidelines: A complete evaluation includes B-mode imaging, spectral Doppler, color Doppler, and power Doppler as needed of all accessible portions of each vessel. Bilateral testing is considered an integral part of a complete examination. Limited examinations for reoccurring indications may be performed as noted.  Right Carotid Findings: +----------+--------+--------+--------+--------------------------+--------+           PSV cm/sEDV cm/sStenosisPlaque Description        Comments +----------+--------+--------+--------+--------------------------+--------+ CCA Prox  193     15              heterogenous                       +----------+--------+--------+--------+--------------------------+--------+ CCA Mid   62      7               heterogenous and irregular         +----------+--------+--------+--------+--------------------------+--------+ CCA Distal81      9               hyperechoic                         +----------+--------+--------+--------+--------------------------+--------+ ICA Prox  100     18      1-39%   heterogenous and irregular         +----------+--------+--------+--------+--------------------------+--------+ ICA Mid   96      22                                                 +----------+--------+--------+--------+--------------------------+--------+ ICA Distal70      18                                                 +----------+--------+--------+--------+--------------------------+--------+ ECA       297     17      >50%    heterogenous and irregular         +----------+--------+--------+--------+--------------------------+--------+ +----------+--------+-------+---------+-------------------+           PSV cm/sEDV cmsDescribe Arm Pressure (mmHG) +----------+--------+-------+---------+-------------------+ Ezzard Standing            Turbulent156                 +----------+--------+-------+---------+-------------------+ +---------+--------+--+--------+-+---------+ VertebralPSV cm/s38EDV cm/s4Antegrade +---------+--------+--+--------+-+---------+ Velocities in the RICA remain within normal range and stable compared to the prior exam. Left Carotid Findings: +----------+--------+--------+--------+------------------+------------------+  PSV cm/sEDV cm/sStenosisPlaque DescriptionComments           +----------+--------+--------+--------+------------------+------------------+ CCA Prox  103     8                                                    +----------+--------+--------+--------+------------------+------------------+ CCA Mid   78      9               heterogenous                         +----------+--------+--------+--------+------------------+------------------+ CCA Distal56      9                                                    +----------+--------+--------+--------+------------------+------------------+ ICA Prox  68       13              heterogenous                         +----------+--------+--------+--------+------------------+------------------+ ICA Mid   76      17      1-39%                     s/p endarterectomy +----------+--------+--------+--------+------------------+------------------+ ICA Distal76      24                                                   +----------+--------+--------+--------+------------------+------------------+ ECA       286     13      >50%    heterogenous                         +----------+--------+--------+--------+------------------+------------------+ +----------+--------+--------+--------+-------------------+           PSV cm/sEDV cm/sDescribeArm Pressure (mmHG) +----------+--------+--------+--------+-------------------+ [id number]             Stenotic150                 +----------+--------+--------+--------+-------------------+ +---------+--------+--+--------+--+---------+ VertebralPSV cm/s64EDV cm/s10Antegrade +---------+--------+--+--------+--+---------+ Velocities in the LICA remain within normal range and stable compared to the prior exam, s/p endarterectomy.  Summary: Right Carotid: Velocities in the right ICA are consistent with a 1-39% stenosis.                Non-hemodynamically significant plaque <50% noted in the CCA.                The ECA appears >50% stenosed. Left Carotid: Velocities in the left ICA are consistent with a 1-39% stenosis.               Non-hemodynamically significant plaque <50% noted in the CCA.               The ECA appears >50% stenosed. Vertebrals:  Bilateral vertebral arteries demonstrate antegrade flow. Subclavians: Left subclavian artery was stenotic. Right subclavian artery flow              was disturbed. *See  table(s) above for measurements and observations. Suggest follow up study in 12 months. Electronically signed by Ida Rogue MD on 01/22/2020 at 5:32:00 PM.    Final     Assessment & Plan:    There are no diagnoses linked to this encounter.   No orders of the defined types were placed in this encounter.    Follow-up: No follow-ups on file.  Walker Kehr, MD

## 2020-02-17 NOTE — Assessment & Plan Note (Signed)
Almost quit Discussed CXR

## 2020-02-17 NOTE — Assessment & Plan Note (Signed)
Chronic Cont w/Rx Pt lost wt

## 2020-02-17 NOTE — Assessment & Plan Note (Signed)
Percocet prn Not to take w/Clonazepam  Potential benefits of a long term opioids use as well as potential risks (i.e. addiction risk, apnea etc) and complications (i.e. Somnolence, constipation and others) were explained to the patient and were aknowledged. 

## 2020-02-17 NOTE — Addendum Note (Signed)
Addended by: Cresenciano Lick on: 02/17/2020 09:58 AM   Modules accepted: Orders

## 2020-02-17 NOTE — Assessment & Plan Note (Signed)
CXR

## 2020-02-19 ENCOUNTER — Telehealth: Payer: Self-pay | Admitting: Cardiovascular Disease

## 2020-02-19 NOTE — Telephone Encounter (Signed)
Called patient with lab results.  

## 2020-02-19 NOTE — Telephone Encounter (Signed)
Patient returning Pam's call in regards to lab results.

## 2020-03-16 ENCOUNTER — Ambulatory Visit (INDEPENDENT_AMBULATORY_CARE_PROVIDER_SITE_OTHER): Payer: Medicare Other

## 2020-03-16 DIAGNOSIS — Z0000 Encounter for general adult medical examination without abnormal findings: Secondary | ICD-10-CM

## 2020-03-16 NOTE — Patient Instructions (Addendum)
[name] , Thank you for taking time to come for your Medicare Wellness Visit. I appreciate your ongoing commitment to your health goals. Please review the following plan we discussed and let me know if I can assist you in the future.   Screening recommendations/referrals: Colonoscopy: never done; patient has Cologuard kit Recommended yearly ophthalmology/optometry visit for glaucoma screening and checkup Recommended yearly dental visit for hygiene and checkup  Vaccinations: Influenza vaccine: overdue Pneumococcal vaccine: completed Tdap vaccine: 03/30/2015; due every 10 years Shingles vaccine: never done   Covid-19: completed  Advanced directives: Advance directive discussed with you today. Even though you declined this today please call our office should you change your mind and we can give you the proper paperwork for you to fill out.   Conditions/risks identified: Yes; Please continue to do your personal lifestyle choices by: daily care of teeth and gums, regular physical activity (goal should be 5 days a week for 30 minutes), eat a healthy diet, avoid tobacco and drug use, limiting any alcohol intake, taking a low-dose aspirin (if not allergic or have been advised by your provider otherwise) and taking vitamins and minerals as recommended by your provider. Continue doing brain stimulating activities (puzzles, reading, adult coloring books, staying active) to keep memory sharp. Continue to eat heart healthy diet (full of fruits, vegetables, whole grains, lean protein, water--limit salt, fat, and sugar intake) and increase physical activity as tolerated.  Next appointment: Please schedule your next Medicare Wellness Visit with your Nurse Health Advisor in 1 year.  Preventive Care 79 Years and Older, Male Preventive care refers to lifestyle choices and visits with your health care provider that can promote health and wellness. What does preventive care include?  A yearly physical exam.  This is also called an annual well check.  Dental exams once or twice a year.  Routine eye exams. Ask your health care provider how often you should have your eyes checked.  Personal lifestyle choices, including:  Daily care of your teeth and gums.  Regular physical activity.  Eating a healthy diet.  Avoiding tobacco and drug use.  Limiting alcohol use.  Practicing safe sex.  Taking low doses of aspirin every day.  Taking vitamin and mineral supplements as recommended by your health care provider. What happens during an annual well check? The services and screenings done by your health care provider during your annual well check will depend on your age, overall health, lifestyle risk factors, and family history of disease. Counseling  Your health care provider may ask you questions about your:  Alcohol use.  Tobacco use.  Drug use.  Emotional well-being.  Home and relationship well-being.  Sexual activity.  Eating habits.  History of falls.  Memory and ability to understand (cognition).  Work and work Statistician. Screening  You may have the following tests or measurements:  Height, weight, and BMI.  Blood pressure.  Lipid and cholesterol levels. These may be checked every 5 years, or more frequently if you are over 28 years old.  Skin check.  Lung cancer screening. You may have this screening every year starting at age 51 if you have a 30-pack-year history of smoking and currently smoke or have quit within the past 15 years.  Fecal occult blood test (FOBT) of the stool. You may have this test every year starting at age 62.  Flexible sigmoidoscopy or colonoscopy. You may have a sigmoidoscopy every 5 years or a colonoscopy every 10 years starting at age 4.  Prostate  cancer screening. Recommendations will vary depending on your family history and other risks.  Hepatitis C blood test.  Hepatitis B blood test.  Sexually transmitted disease (STD)  testing.  Diabetes screening. This is done by checking your blood sugar (glucose) after you have not eaten for a while (fasting). You may have this done every 1-3 years.  Abdominal aortic aneurysm (AAA) screening. You may need this if you are a current or former smoker.  Osteoporosis. You may be screened starting at age 30 if you are at high risk. Talk with your health care provider about your test results, treatment options, and if necessary, the need for more tests. Vaccines  Your health care provider may recommend certain vaccines, such as:  Influenza vaccine. This is recommended every year.  Tetanus, diphtheria, and acellular pertussis (Tdap, Td) vaccine. You may need a Td booster every 10 years.  Zoster vaccine. You may need this after age 22.  Pneumococcal 13-valent conjugate (PCV13) vaccine. One dose is recommended after age 47.  Pneumococcal polysaccharide (PPSV23) vaccine. One dose is recommended after age 57. Talk to your health care provider about which screenings and vaccines you need and how often you need them. This information is not intended to replace advice given to you by your health care provider. Make sure you discuss any questions you have with your health care provider. Document Released: 08/27/2015 Document Revised: 04/19/2016 Document Reviewed: 06/01/2015 Elsevier Interactive Patient Education  2017 [place] Prevention in the Home Falls can cause injuries. They can happen to people of all ages. There are many things you can do to make your home safe and to help prevent falls. What can I do on the outside of my home?  Regularly fix the edges of walkways and driveways and fix any cracks.  Remove anything that might make you trip as you walk through a door, such as a raised step or threshold.  Trim any bushes or trees on the path to your home.  Use bright outdoor lighting.  Clear any walking paths of anything that might make someone trip, such as  rocks or tools.  Regularly check to see if handrails are loose or broken. Make sure that both sides of any steps have handrails.  Any raised decks and porches should have guardrails on the edges.  Have any leaves, snow, or ice cleared regularly.  Use sand or salt on walking paths during winter.  Clean up any spills in your garage right away. This includes oil or grease spills. What can I do in the bathroom?  Use night lights.  Install grab bars by the toilet and in the tub and shower. Do not use towel bars as grab bars.  Use non-skid mats or decals in the tub or shower.  If you need to sit down in the shower, use a plastic, non-slip stool.  Keep the floor dry. Clean up any water that spills on the floor as soon as it happens.  Remove soap buildup in the tub or shower regularly.  Attach bath mats securely with double-sided non-slip rug tape.  Do not have throw rugs and other things on the floor that can make you trip. What can I do in the bedroom?  Use night lights.  Make sure that you have a light by your bed that is easy to reach.  Do not use any sheets or blankets that are too big for your bed. They should not hang down onto the floor.  Have a  firm chair that has side arms. You can use this for support while you get dressed.  Do not have throw rugs and other things on the floor that can make you trip. What can I do in the kitchen?  Clean up any spills right away.  Avoid walking on wet floors.  Keep items that you use a lot in easy-to-reach places.  If you need to reach something above you, use a strong step stool that has a grab bar.  Keep electrical cords out of the way.  Do not use floor polish or wax that makes floors slippery. If you must use wax, use non-skid floor wax.  Do not have throw rugs and other things on the floor that can make you trip. What can I do with my stairs?  Do not leave any items on the stairs.  Make sure that there are handrails on  both sides of the stairs and use them. Fix handrails that are broken or loose. Make sure that handrails are as long as the stairways.  Check any carpeting to make sure that it is firmly attached to the stairs. Fix any carpet that is loose or worn.  Avoid having throw rugs at the top or bottom of the stairs. If you do have throw rugs, attach them to the floor with carpet tape.  Make sure that you have a light switch at the top of the stairs and the bottom of the stairs. If you do not have them, ask someone to add them for you. What else can I do to help prevent falls?  Wear shoes that:  Do not have high heels.  Have rubber bottoms.  Are comfortable and fit you well.  Are closed at the toe. Do not wear sandals.  If you use a stepladder:  Make sure that it is fully opened. Do not climb a closed stepladder.  Make sure that both sides of the stepladder are locked into place.  Ask someone to hold it for you, if possible.  Clearly mark and make sure that you can see:  Any grab bars or handrails.  First and last steps.  Where the edge of each step is.  Use tools that help you move around (mobility aids) if they are needed. These include:  Canes.  Walkers.  Scooters.  Crutches.  Turn on the lights when you go into a dark area. Replace any light bulbs as soon as they burn out.  Set up your furniture so you have a clear path. Avoid moving your furniture around.  If any of your floors are uneven, fix them.  If there are any pets around you, be aware of where they are.  Review your medicines with your doctor. Some medicines can make you feel dizzy. This can increase your chance of falling. Ask your doctor what other things that you can do to help prevent falls. This information is not intended to replace advice given to you by your health care provider. Make sure you discuss any questions you have with your health care provider. Document Released: 05/27/2009 Document  Revised: 01/06/2016 Document Reviewed: 09/04/2014 Elsevier Interactive Patient Education  2017 [employer].

## 2020-03-16 NOTE — Progress Notes (Addendum)
I connected with [name] today by telephone and verified that I am speaking with the correct person using two identifiers. Location patient: home Location provider: work Persons participating in the virtual visit: [name] and [name]. Scott Merrihew, LPN   I discussed the limitations, risks, security and privacy concerns of performing an evaluation and management service by telephone and the availability of in person appointments. I also discussed with the patient that there may be a patient responsible charge related to this service. The patient expressed understanding and verbally consented to this telephonic visit.    Interactive audio and video telecommunications were attempted between this provider and patient, however failed, due to patient having technical difficulties OR patient did not have access to video capability.  We continued and completed visit with audio only.  Some vital signs may be absent or patient reported.   Time Spent with patient on telephone encounter: 20 minutes  Subjective:   [name] is a 64 y.o. male who presents for Medicare Annual/Subsequent preventive examination.  Review of Systems    No ROS. Medicare Wellness Virtual Visit. Additional risk factors are reflected in social history. Cardiac Risk Factors include: advanced age (>56mn, >>59women);diabetes mellitus;dyslipidemia;family history of premature cardiovascular disease;hypertension;male gender;obesity (BMI >30kg/m2);smoking/ tobacco exposure Sleep Patterns: No sleep issues, feels rested on waking and sleeps 6-8 hours nightly. Home Safety/Smoke Alarms: Feels safe in home; uses home alarm. Smoke alarms in place. Living environment: 1-story home; Lives with spouse; no needs for DME; good support system. Seat Belt Safety/Bike Helmet: Wears seat belt.    Objective:    There were no vitals filed for this visit. There is no height or weight on file to calculate BMI.  Advanced  Directives 03/16/2020 12/07/2017 11/15/2017 05/18/2017 08/25/2016 06/23/2016 04/28/2016  Does Patient Have a Medical Advance Directive? No No No No Yes No Yes  Type of Advance Directive - - - - HPress photographerLiving will - [place] will  Does patient want to make changes to medical advance directive? - - - - No - Patient declined - No - Patient declined  Copy of [place] Chart? - - - - Yes - Yes  Would patient like information on creating a medical advance directive? No - Patient declined No - Patient declined No - Patient declined No - Patient declined - No - patient declined information -  Pre-existing out of facility DNR order (yellow form or pink MOST form) - - - - - - -    Current Medications (verified) Outpatient Encounter Medications as of 03/16/2020  Medication Sig   albuterol (PROVENTIL) (2.5 MG/3ML) 0.083% nebulizer solution Take 3 mLs (2.5 mg total) by nebulization daily as needed for wheezing or shortness of breath.   ANORO ELLIPTA 62.5-25 MCG/INH AEPB Inhale 1 puff into the lungs daily as needed. For wheezing & SOB   aspirin EC 81 MG tablet Take 81 mg by mouth every evening.    clonazePAM (KLONOPIN) 0.5 MG tablet TAKE 1 TABLET TWICE A DAY AS NEEDED FOR ANXIETY OR AT BEDTIME FOR INSOMNIA   clopidogrel (PLAVIX) 75 MG tablet TAKE 1 TABLET BY MOUTH EVERY DAY WITH BREAKFAST   metolazone (ZAROXOLYN) 5 MG tablet Take 1 tablet (5 mg total) by mouth daily. (Patient not taking: Reported on 02/17/2020)   metoprolol succinate (TOPROL-XL) 50 MG 24 hr tablet Take 2 tablets (100 mg total) by mouth daily.   nitroGLYCERIN (NITROSTAT) 0.4 MG SL tablet Place  1 tablet (0.4 mg total) under the tongue every 5 (five) minutes as needed for chest pain (Up to 3 doses).   oxyCODONE-acetaminophen (PERCOCET/ROXICET) 5-325 MG tablet Take 1 tablet by mouth every 6 (six) hours as needed for severe pain. Please fill on or after 02/17/20   oxymetazoline (AFRIN)  0.05 % nasal spray Place 1 spray into both nostrils daily as needed for congestion.   potassium chloride SA (K-DUR) 20 MEQ tablet TAKE 1 TABLET BY MOUTH  DAILY   simvastatin (ZOCOR) 20 MG tablet Take 1 tablet (20 mg total) by mouth at bedtime.   torsemide (DEMADEX) 100 MG tablet Take 1 tablet (100 mg total) by mouth daily.   [DISCONTINUED] acetaminophen (TYLENOL) 325 MG tablet Take 2 tablets (650 mg total) by mouth every 6 (six) hours as needed for mild pain or headache. (Patient not taking: Reported on 03/16/2020)   [DISCONTINUED] gabapentin (NEURONTIN) 100 MG capsule Take 1 capsule (100 mg total) by mouth 3 (three) times daily. (Patient not taking: Reported on 03/16/2020)   No facility-administered encounter medications on file as of 03/16/2020.    Allergies (verified) Bendamustine hcl, Heparin, and Tape   History: Past Medical History:  Diagnosis Date   Anginal pain ([place])    not had to use in awhile  none in 10 years   Anxiety    Basal cell carcinoma of nose    Blood dyscrasia    trouble clotting    Carotid artery disease ([place])    s/p L CEA 2009 (hx of evacuation of hematoma due to heparin)   CHF (congestive heart failure) (HCC)    Chronic lower back pain    COPD (chronic obstructive pulmonary disease) ([place])    Coronary artery disease    CABG 2005. s/p PTCA and stenting of the saphenous vein graft to PDA and saphenous vein graft to obtuse marginal by Dr. Burt Knack 09/29/11. 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   Myocardial infarction ([place])    Obesity    Shortness of breath dyspnea    with exertion    Sleep apnea    "used to"      could not use 2006   Past  Surgical History:  Procedure Laterality Date   BASAL CELL CARCINOMA EXCISION  2000's   nose   CARDIAC CATHETERIZATION  12/07/2017   CAROTID ENDARTERECTOMY  03/2008   left   CORONARY ANGIOPLASTY WITH STENT PLACEMENT  09/29/11   "2"   CORONARY ARTERY BYPASS GRAFT  2005   CABG X3   CORONARY STENT INTERVENTION  12/07/2017   CORONARY STENT INTERVENTION N/A 12/07/2017   Procedure: CORONARY STENT INTERVENTION;  Surgeon: Burnell Blanks, MD;  Location: [facility];  Service: Cardiovascular;  Laterality: N/A;   evacuation of hematoma  03/2008   left neck; S/P endarterectomy; "cause heparin clotted it up"   I & D EXTREMITY Left 02/11/2015   Procedure: IRRIGATION AND DEBRIDEMENT LEFT LONG FINGER;  Surgeon: Leanora Cover, MD;  Location: [facility];  Service: Orthopedics;  Laterality: Left;  I and D Left Long Finger  IR REMOVAL TUN ACCESS W/ PORT W/O FL MOD SED  05/18/2017   MASS EXCISION Left 12/11/2014   Procedure: EXCISIONAL BIOPSY OF LEFT SUPRA CLAVICULAR NECK MASS;  Surgeon: Jerrell Belfast, MD;  [facility];  Service: ENT;  Laterality: Left;   PERCUTANEOUS CORONARY STENT INTERVENTION (PCI-S) N/A 09/29/2011   Procedure: PERCUTANEOUS CORONARY STENT INTERVENTION (PCI-S);  Surgeon: Sherren Mocha, MD;  Location: [facility];  Service: Cardiovascular;  Laterality: N/A;   PORTACATH PLACEMENT  2016   RIGHT/LEFT HEART CATH AND CORONARY/GRAFT ANGIOGRAPHY N/A 12/07/2017   Procedure: RIGHT/LEFT HEART CATH AND CORONARY/GRAFT ANGIOGRAPHY;  Surgeon: Burnell Blanks, MD;  Location: [facility];  Service: Cardiovascular;  Laterality: N/A;   SUPERFICIAL LYMPH NODE BIOPSY / EXCISION Left    Family History  Problem Relation Age of Onset   Heart disease Father    Bone cancer Mother    Social History   Socioeconomic History   Marital status: Married    Spouse name: Not on file   Number of children: 1   Years of education: Not on file   Highest education level: Not  on file  Occupational History   Occupation: Museum/gallery curator  Tobacco Use   Smoking status: Former Smoker    Packs/day: 2.00    Years: 41.00    Pack years: 82.00    Types: Cigarettes    Quit date: 11/23/2017    Years since quitting: 2.3   Smokeless tobacco: Former Systems developer    Types: Chew   Tobacco comment: consult entered  Vaping Use   Vaping Use: Never used  Substance and Sexual Activity   Alcohol use: No    Alcohol/week: 0.0 standard drinks    Comment: 09/29/11 "quit years ago"   Drug use: Yes    Types: Marijuana    Comment: "last time was in the 1990's"   Sexual activity: Not Currently  Other Topics Concern   Not on file  Social History Narrative   Not on file   Social Determinants of Health   Financial Resource Strain: Low Risk    Difficulty of Paying Living Expenses: Not hard at all  Food Insecurity: No Food Insecurity   Worried About Charity fundraiser in the Last Year: Never true   [place] in the Last Year: Never true  Transportation Needs: No Transportation Needs   Lack of Transportation (Medical): No   Lack of Transportation (Non-Medical): No  Physical Activity: Sufficiently Active   Days of Exercise per Week: 5 days   Minutes of Exercise per Session: 30 min  Stress: No Stress Concern Present   Feeling of Stress : Not at all  Social Connections: Socially Integrated   Frequency of Communication with Friends and Family: More than three times a week   Frequency of Social Gatherings with Friends and Family: More than three times a week   Attends Religious Services: More than 4 times per year   Active Member of [employer] or Organizations: Yes   Attends Music therapist: More than 4 times per year   Marital Status: Married    Tobacco Counseling Counseling given: Not Answered Comment: consult entered   Clinical Intake:  Pre-visit preparation completed: Yes  Pain : No/denies pain     Nutritional Risks: None Diabetes: Yes CBG done?:  No Did pt. bring in CBG monitor from home?: No  How often do you need to have someone help you when you read instructions, pamphlets, or other written materials from  your doctor or pharmacy?: 1 - Never What is the last grade level you completed in school?: 10th grade  Diabetic? yes  Interpreter Needed?: No  Information entered by :: Morissa Obeirne N. Olivette Beckmann, LPN   Activities of Daily Living In your present state of health, do you have any difficulty performing the following activities: 03/16/2020  Hearing? N  Vision? N  Difficulty concentrating or making decisions? N  Walking or climbing stairs? N  Dressing or bathing? N  Doing errands, shopping? N  Preparing Food and eating ? N  Using the Toilet? N  In the past six months, have you accidently leaked urine? N  Do you have problems with loss of bowel control? N  Managing your Medications? N  Managing your Finances? N  Housekeeping or managing your Housekeeping? N  Some recent data might be hidden    Patient Care Team: Plotnikov, [name], MD as PCP - General (Internal Medicine) [name], MD as PCP - Cardiology (Cardiology) [name], MD as Consulting Physician (Cardiology) Heath Lark, MD as Consulting Physician (Hematology and Oncology) Jerrell Belfast, MD as Consulting Physician (Otolaryngology) Daryll Brod, MD as Consulting Physician (Orthopedic Surgery) Brand Males, MD as Consulting Physician (Pulmonary Disease)  Indicate any recent Medical Services you may have received from other than [facility] providers in the past year (date may be approximate).     Assessment:   This is a routine wellness examination for [place].  Hearing/Vision screen No exam data present  Dietary issues and exercise activities discussed: Current Exercise Habits: Home exercise routine, Type of exercise: walking (yard work), Time (Minutes): 30, Frequency (Times/Week): 5, Weekly Exercise (Minutes/Week): 150, Intensity: Mild, Exercise  limited by: respiratory conditions(s)  Goals       Patient Stated (pt-stated)      My goal for this year is to quit smoking.       Depression Screen PHQ 2/9 Scores 03/16/2020  PHQ - 2 Score 0    Fall Risk Fall Risk  03/16/2020  Falls in the past year? 0  Number falls in past yr: 0  Injury with Fall? 0  Risk for fall due to : No Fall Risks  Follow up Falls evaluation completed    Any stairs in or around the home? No  If so, are there any without handrails? No  Home free of loose throw rugs in walkways, pet beds, electrical cords, etc? Yes  Adequate lighting in your home to reduce risk of falls? Yes   ASSISTIVE DEVICES UTILIZED TO PREVENT FALLS:  Life alert? No  Use of a cane, walker or w/c? No  Grab bars in the bathroom? No  Shower chair or bench in shower? No  Elevated toilet seat or a handicapped toilet? No   TIMED UP AND GO:  Was the test performed? No .  Length of time to ambulate 10 feet: 0 sec.   Gait steady and fast without use of assistive device  Cognitive Function: Not indicates; patient is cogitatively intact.        Immunizations Immunization History  Administered Date(s) Administered   Influenza Split 09/30/2011   Influenza,inj,Quad PF,6+ Mos 09/22/2013, 09/25/2014, 04/30/2015, 06/23/2016, 06/19/2018   PFIZER SARS-COV-2 Vaccination 10/27/2019, 11/13/2019   Pneumococcal Conjugate-13 09/25/2014   Pneumococcal Polysaccharide-23 03/30/2015   Tdap 03/30/2015    TDAP status: Up to date Flu Vaccine status: Declined, Education has been provided regarding the importance of this vaccine but patient still declined. Advised may receive this vaccine at local pharmacy or Health  Dept. Aware to provide a copy of the vaccination record if obtained from local pharmacy or Health Dept. Verbalized acceptance and understanding. Pneumococcal vaccine status: Up to date Covid-19 vaccine status: Completed vaccines  Qualifies for Shingles Vaccine? Yes   Zostavax  completed No   Shingrix Completed?: No.    Education has been provided regarding the importance of this vaccine. Patient has been advised to call insurance company to determine out of pocket expense if they have not yet received this vaccine. Advised may also receive vaccine at local pharmacy or Health Dept. Verbalized acceptance and understanding.  Screening Tests Health Maintenance  Topic Date Due   OPHTHALMOLOGY EXAM  Never done   COLONOSCOPY  Never done   FOOT EXAM  03/29/2017   URINE MICROALBUMIN  03/29/2017   INFLUENZA VACCINE  03/14/2020   PNA vac Low Risk Adult (2 of 2 - PPSV23) 03/29/2020   HEMOGLOBIN A1C  08/19/2020   TETANUS/TDAP  03/29/2025   COVID-19 Vaccine  Completed   Hepatitis C Screening  Completed    Health Maintenance  Health Maintenance Due  Topic Date Due   OPHTHALMOLOGY EXAM  Never done   COLONOSCOPY  Never done   FOOT EXAM  03/29/2017   URINE MICROALBUMIN  03/29/2017   INFLUENZA VACCINE  03/14/2020    Colorectal Cancer Screening: Never done; patient has decided to do Cologuard kit  Lung Cancer Screening: (Low Dose CT Chest recommended if Age 98-80 years, 30 pack-year currently smoking OR have quit w/in 15years.) does qualify.   Lung Cancer Screening Referral: no  Additional Screening:  Hepatitis C Screening: does qualify; Completed yes  Vision Screening: Recommended annual ophthalmology exams for early detection of glaucoma and other disorders of the eye. Is the patient up to date with their annual eye exam?  Yes  Who is the provider or what is the name of the office in which the patient attends annual eye exams? [facility] If pt is not established with a provider, would they like to be referred to a provider to establish care? No .   Dental Screening: Recommended annual dental exams for proper oral hygiene  Community Resource Referral / Chronic Care Management: CRR required this visit?  No   CCM required this visit?  No        Plan:     I have personally reviewed and noted the following in the patient's chart:   Medical and social history Use of alcohol, tobacco or illicit drugs  Current medications and supplements Functional ability and status Nutritional status Physical activity Advanced directives List of other physicians Hospitalizations, surgeries, and ER visits in previous 12 months Vitals Screenings to include cognitive, depression, and falls Referrals and appointments  In addition, I have reviewed and discussed with patient certain preventive protocols, quality metrics, and best practice recommendations. A written personalized care plan for preventive services as well as general preventive health recommendations were provided to patient.     Sheral Flow, LPN   08/20/7937   Nurse Notes:  Patient is cogitatively intact. There were no vitals filed for this visit. There is no height or weight on file to calculate BMI. Patient stated that he has no issues with gait or balance; does not use any assistive devices. Patient will be sending in stool sample to Cologuard for cancer screening.  Medical screening examination/treatment/procedure(s) were performed by non-physician practitioner and as supervising physician I was immediately available for consultation/collaboration.  I agree with above. Lew Dawes, MD

## 2020-03-22 ENCOUNTER — Ambulatory Visit (INDEPENDENT_AMBULATORY_CARE_PROVIDER_SITE_OTHER): Payer: Medicare Other | Admitting: Internal Medicine

## 2020-03-22 ENCOUNTER — Other Ambulatory Visit: Payer: Self-pay

## 2020-03-22 ENCOUNTER — Encounter: Payer: Self-pay | Admitting: Internal Medicine

## 2020-03-22 VITALS — BP 140/56 | HR 55 | Temp 98.5°F | Ht 72.0 in | Wt 280.0 lb

## 2020-03-22 DIAGNOSIS — I6523 Occlusion and stenosis of bilateral carotid arteries: Secondary | ICD-10-CM | POA: Diagnosis not present

## 2020-03-22 DIAGNOSIS — Z23 Encounter for immunization: Secondary | ICD-10-CM | POA: Diagnosis not present

## 2020-03-22 DIAGNOSIS — R634 Abnormal weight loss: Secondary | ICD-10-CM | POA: Diagnosis not present

## 2020-03-22 DIAGNOSIS — M79605 Pain in left leg: Secondary | ICD-10-CM

## 2020-03-22 DIAGNOSIS — M545 Low back pain, unspecified: Secondary | ICD-10-CM

## 2020-03-22 DIAGNOSIS — I5032 Chronic diastolic (congestive) heart failure: Secondary | ICD-10-CM | POA: Diagnosis not present

## 2020-03-22 MED ORDER — OXYCODONE-ACETAMINOPHEN 5-325 MG PO TABS: 1.0000 | ORAL_TABLET | Freq: Four times a day (QID) | ORAL | 0 refills | Status: DC | PRN

## 2020-03-22 MED ORDER — CLONAZEPAM 0.5 MG PO TABS: ORAL_TABLET | ORAL | 3 refills | Status: DC

## 2020-03-22 NOTE — Addendum Note (Signed)
Addended by: Marijean Heath R on: 03/22/2020 09:16 AM   Modules accepted: Orders

## 2020-03-22 NOTE — Assessment & Plan Note (Signed)
Wt Readings from Last 3 Encounters:  03/22/20 280 lb (127 kg)  02/17/20 280 lb (127 kg)  11/17/19 (!) 306 lb 6 oz (139 kg)

## 2020-03-22 NOTE — Assessment & Plan Note (Signed)
Halted. Will cont to watch

## 2020-03-22 NOTE — Assessment & Plan Note (Signed)
Percocet prn Not to take w/Clonazepam  Potential benefits of a long term opioids use as well as potential risks (i.e. addiction risk, apnea etc) and complications (i.e. Somnolence, constipation and others) were explained to the patient and were aknowledged. 

## 2020-03-22 NOTE — Assessment & Plan Note (Signed)
O2 at night 

## 2020-03-22 NOTE — Progress Notes (Signed)
Subjective:  Patient ID: [name], male    DOB: 23-Dec-1955  Age: 64 y.o. MRN: [id number]  CC: No chief complaint on file.   HPI Keona Sheffler presents for wt loss, chronic pain, COPD, CHF f/u  Outpatient Medications Prior to Visit  Medication Sig Dispense Refill  . albuterol (PROVENTIL) (2.5 MG/3ML) 0.083% nebulizer solution Take 3 mLs (2.5 mg total) by nebulization daily as needed for wheezing or shortness of breath. 75 mL 11  . ANORO ELLIPTA 62.5-25 MCG/INH AEPB Inhale 1 puff into the lungs daily as needed. For wheezing & SOB 180 each 3  . aspirin EC 81 MG tablet Take 81 mg by mouth every evening.     . clonazePAM (KLONOPIN) 0.5 MG tablet TAKE 1 TABLET TWICE A DAY AS NEEDED FOR ANXIETY OR AT BEDTIME FOR INSOMNIA 60 tablet 3  . clopidogrel (PLAVIX) 75 MG tablet TAKE 1 TABLET BY MOUTH EVERY DAY WITH BREAKFAST 30 tablet 5  . metoprolol succinate (TOPROL-XL) 50 MG 24 hr tablet Take 2 tablets (100 mg total) by mouth daily. 180 tablet 3  . oxyCODONE-acetaminophen (PERCOCET/ROXICET) 5-325 MG tablet Take 1 tablet by mouth every 6 (six) hours as needed for severe pain. Please fill on or after 02/17/20 120 tablet 0  . oxymetazoline (AFRIN) 0.05 % nasal spray Place 1 spray into both nostrils daily as needed for congestion.    . potassium chloride SA (K-DUR) 20 MEQ tablet TAKE 1 TABLET BY MOUTH  DAILY 90 tablet 3  . simvastatin (ZOCOR) 20 MG tablet Take 1 tablet (20 mg total) by mouth at bedtime. 90 tablet 3  . torsemide (DEMADEX) 100 MG tablet Take 1 tablet (100 mg total) by mouth daily. 90 tablet 3  . nitroGLYCERIN (NITROSTAT) 0.4 MG SL tablet Place 1 tablet (0.4 mg total) under the tongue every 5 (five) minutes as needed for chest pain (Up to 3 doses). 25 tablet 1  . metolazone (ZAROXOLYN) 5 MG tablet Take 1 tablet (5 mg total) by mouth daily. (Patient not taking: Reported on 02/17/2020) 90 tablet 3   No facility-administered medications prior to visit.    ROS: Review of  Systems  Constitutional: Positive for fatigue. Negative for appetite change and unexpected weight change.  HENT: Negative for congestion, nosebleeds, sneezing, sore throat and trouble swallowing.   Eyes: Negative for itching and visual disturbance.  Respiratory: Negative for cough.   Cardiovascular: Positive for leg swelling. Negative for chest pain and palpitations.  Gastrointestinal: Negative for abdominal distention, blood in stool, diarrhea and nausea.  Genitourinary: Negative for frequency and hematuria.  Musculoskeletal: Positive for arthralgias, back pain and gait problem. Negative for joint swelling and neck pain.  Skin: Negative for rash.  Neurological: Negative for dizziness, tremors, speech difficulty and weakness.  Psychiatric/Behavioral: Negative for agitation, dysphoric mood and sleep disturbance. The patient is not nervous/anxious.     Objective:  BP (!) 140/56 (BP Location: Left Arm, Patient Position: Sitting, Cuff Size: Large)   Pulse (!) 55   Temp 98.5 F (36.9 C) (Oral)   Ht 6' (1.829 m)   Wt 280 lb (127 kg)   SpO2 93%   BMI 37.97 kg/m   BP Readings from Last 3 Encounters:  03/22/20 (!) 140/56  02/17/20 130/62  11/17/19 (!) 162/78    Wt Readings from Last 3 Encounters:  03/22/20 280 lb (127 kg)  02/17/20 280 lb (127 kg)  11/17/19 (!) 306 lb 6 oz (139 kg)    Physical Exam Constitutional:  General: He is not in acute distress.    Appearance: He is well-developed. He is obese.     Comments: NAD  Eyes:     Conjunctiva/sclera: Conjunctivae normal.     Pupils: Pupils are equal, round, and reactive to light.  Neck:     Thyroid: No thyromegaly.     Vascular: No JVD.  Cardiovascular:     Rate and Rhythm: Normal rate and regular rhythm.     Heart sounds: Normal heart sounds. No murmur heard.  No friction rub. No gallop.   Pulmonary:     Effort: Pulmonary effort is normal. No respiratory distress.     Breath sounds: Normal breath sounds. No wheezing  or rales.  Chest:     Chest wall: No tenderness.  Abdominal:     General: Bowel sounds are normal. There is no distension.     Palpations: Abdomen is soft. There is no mass.     Tenderness: There is no abdominal tenderness. There is no guarding or rebound.  Musculoskeletal:        General: No tenderness. Normal range of motion.     Cervical back: Normal range of motion.     Right lower leg: Edema present.     Left lower leg: Edema present.  Lymphadenopathy:     Cervical: No cervical adenopathy.  Skin:    General: Skin is warm and dry.     Findings: No rash.  Neurological:     Mental Status: He is alert and oriented to person, place, and time.     Cranial Nerves: No cranial nerve deficit.     Motor: No abnormal muscle tone.     Coordination: Coordination normal.     Gait: Gait normal.     Deep Tendon Reflexes: Reflexes are normal and symmetric.  Psychiatric:        Behavior: Behavior normal.        Thought Content: Thought content normal.        Judgment: Judgment normal.   hyperpigm LEs  Lab Results  Component Value Date   WBC 8.8 02/17/2020   HGB 15.6 02/17/2020   HCT 45.4 02/17/2020   PLT 127.0 (L) 02/17/2020   GLUCOSE 135 (H) 02/17/2020   CHOL 108 01/01/2018   TRIG 98 01/01/2018   HDL 35 (L) 01/01/2018   LDLCALC 53 01/01/2018   ALT 16 02/17/2020   AST 19 02/17/2020   NA 140 02/17/2020   K 4.4 02/17/2020   CL 97 02/17/2020   CREATININE 0.77 02/17/2020   BUN 9 02/17/2020   CO2 36 (H) 02/17/2020   TSH 0.80 02/17/2020   PSA 0.23 09/15/2011   INR 1.0 11/28/2017   HGBA1C 5.6 02/17/2020   MICROALBUR 0.7 03/29/2016    VAS US CAROTID  Result Date: 01/22/2020 Carotid Arterial Duplex Study Indications:       Left endarterectomy and bilateral carotid artery stenosis.                    Patient denies any cerebrovascular symptoms. Risk Factors:      Hypertension, hyperlipidemia, Diabetes, past history of                    smoking, coronary artery disease. Other  Factors:     CABG, COPD. Comparison Study:  In 01/2019, a carotid duplex performed at showed velocities                    of 119/36 cm/s  in the RICA and 382/50 cm/s in the LICA. Performing Technologist: Sharlett Iles RVT  Examination Guidelines: A complete evaluation includes B-mode imaging, spectral Doppler, color Doppler, and power Doppler as needed of all accessible portions of each vessel. Bilateral testing is considered an integral part of a complete examination. Limited examinations for reoccurring indications may be performed as noted.  Right Carotid Findings: +----------+--------+--------+--------+--------------------------+--------+           PSV cm/sEDV cm/sStenosisPlaque Description        Comments +----------+--------+--------+--------+--------------------------+--------+ CCA Prox  193     15              heterogenous                       +----------+--------+--------+--------+--------------------------+--------+ CCA Mid   62      7               heterogenous and irregular         +----------+--------+--------+--------+--------------------------+--------+ CCA Distal81      9               hyperechoic                        +----------+--------+--------+--------+--------------------------+--------+ ICA Prox  100     18      1-39%   heterogenous and irregular         +----------+--------+--------+--------+--------------------------+--------+ ICA Mid   96      22                                                 +----------+--------+--------+--------+--------------------------+--------+ ICA Distal70      18                                                 +----------+--------+--------+--------+--------------------------+--------+ ECA       297     17      >50%    heterogenous and irregular         +----------+--------+--------+--------+--------------------------+--------+ +----------+--------+-------+---------+-------------------+           PSV  cm/sEDV cmsDescribe Arm Pressure (mmHG) +----------+--------+-------+---------+-------------------+ [place]ard Standing            Turbulent156                 +----------+--------+-------+---------+-------------------+ +---------+--------+--+--------+-+---------+ VertebralPSV cm/s38EDV cm/s4Antegrade +---------+--------+--+--------+-+---------+ Velocities in the RICA remain within normal range and stable compared to the prior exam. Left Carotid Findings: +----------+--------+--------+--------+------------------+------------------+           PSV cm/sEDV cm/sStenosisPlaque DescriptionComments           +----------+--------+--------+--------+------------------+------------------+ CCA Prox  103     8                                                    +----------+--------+--------+--------+------------------+------------------+ CCA Mid   78      9               heterogenous                         +----------+--------+--------+--------+------------------+------------------+  CCA Distal56      9                                                    +----------+--------+--------+--------+------------------+------------------+ ICA Prox  68      13              heterogenous                         +----------+--------+--------+--------+------------------+------------------+ ICA Mid   76      17      1-39%                     s/p endarterectomy +----------+--------+--------+--------+------------------+------------------+ ICA Distal76      24                                                   +----------+--------+--------+--------+------------------+------------------+ ECA       286     13      >50%    heterogenous                         +----------+--------+--------+--------+------------------+------------------+ +----------+--------+--------+--------+-------------------+           PSV cm/sEDV cm/sDescribeArm Pressure (mmHG)  +----------+--------+--------+--------+-------------------+ [id number]             Stenotic150                 +----------+--------+--------+--------+-------------------+ +---------+--------+--+--------+--+---------+ VertebralPSV cm/s64EDV cm/s10Antegrade +---------+--------+--+--------+--+---------+ Velocities in the LICA remain within normal range and stable compared to the prior exam, s/p endarterectomy.  Summary: Right Carotid: Velocities in the right ICA are consistent with a 1-39% stenosis.                Non-hemodynamically significant plaque <50% noted in the CCA.                The ECA appears >50% stenosed. Left Carotid: Velocities in the left ICA are consistent with a 1-39% stenosis.               Non-hemodynamically significant plaque <50% noted in the CCA.               The ECA appears >50% stenosed. Vertebrals:  Bilateral vertebral arteries demonstrate antegrade flow. Subclavians: Left subclavian artery was stenotic. Right subclavian artery flow              was disturbed. *See table(s) above for measurements and observations. Suggest follow up study in 12 months. Electronically signed by Ida Rogue MD on 01/22/2020 at 5:32:00 PM.    Final     Assessment & Plan:   There are no diagnoses linked to this encounter.   No orders of the defined types were placed in this encounter.    Follow-up: No follow-ups on file.  Walker Kehr, MD

## 2020-04-06 LAB — COLOGUARD: Cologuard: NEGATIVE

## 2020-04-18 ENCOUNTER — Other Ambulatory Visit: Payer: Self-pay | Admitting: Internal Medicine

## 2020-04-26 ENCOUNTER — Other Ambulatory Visit: Payer: Self-pay | Admitting: Hematology and Oncology

## 2020-04-26 DIAGNOSIS — C8228 Follicular lymphoma grade III, unspecified, lymph nodes of multiple sites: Secondary | ICD-10-CM

## 2020-04-27 ENCOUNTER — Other Ambulatory Visit: Payer: Self-pay

## 2020-04-27 ENCOUNTER — Other Ambulatory Visit: Payer: BC Managed Care – PPO

## 2020-04-27 ENCOUNTER — Ambulatory Visit: Payer: BC Managed Care – PPO | Admitting: Hematology and Oncology

## 2020-04-27 ENCOUNTER — Encounter: Payer: Self-pay | Admitting: Hematology and Oncology

## 2020-04-27 ENCOUNTER — Inpatient Hospital Stay: Payer: Medicare Other | Attending: Hematology and Oncology

## 2020-04-27 ENCOUNTER — Inpatient Hospital Stay (HOSPITAL_BASED_OUTPATIENT_CLINIC_OR_DEPARTMENT_OTHER): Payer: Medicare Other | Admitting: Hematology and Oncology

## 2020-04-27 DIAGNOSIS — C8228 Follicular lymphoma grade III, unspecified, lymph nodes of multiple sites: Secondary | ICD-10-CM

## 2020-04-27 DIAGNOSIS — Z720 Tobacco use: Secondary | ICD-10-CM

## 2020-04-27 DIAGNOSIS — D696 Thrombocytopenia, unspecified: Secondary | ICD-10-CM

## 2020-04-27 DIAGNOSIS — D72829 Elevated white blood cell count, unspecified: Secondary | ICD-10-CM | POA: Diagnosis not present

## 2020-04-27 DIAGNOSIS — I6523 Occlusion and stenosis of bilateral carotid arteries: Secondary | ICD-10-CM

## 2020-04-27 DIAGNOSIS — F17210 Nicotine dependence, cigarettes, uncomplicated: Secondary | ICD-10-CM | POA: Diagnosis not present

## 2020-04-27 DIAGNOSIS — D72825 Bandemia: Secondary | ICD-10-CM | POA: Diagnosis not present

## 2020-04-27 LAB — CBC WITH DIFFERENTIAL/PLATELET
Abs Immature Granulocytes: 0.04 10*3/uL (ref 0.00–0.07)
Basophils Absolute: 0.1 10*3/uL (ref 0.0–0.1)
Basophils Relative: 1 %
Eosinophils Absolute: 0.2 10*3/uL (ref 0.0–0.5)
Eosinophils Relative: 2 %
HCT: 44.9 % (ref 39.0–52.0)
Hemoglobin: 14.9 g/dL (ref 13.0–17.0)
Immature Granulocytes: 0 %
Lymphocytes Relative: 10 %
Lymphs Abs: 1.1 10*3/uL (ref 0.7–4.0)
MCH: 33.6 pg (ref 26.0–34.0)
MCHC: 33.2 g/dL (ref 30.0–36.0)
MCV: 101.1 fL — ABNORMAL HIGH (ref 80.0–100.0)
Monocytes Absolute: 1.1 10*3/uL — ABNORMAL HIGH (ref 0.1–1.0)
Monocytes Relative: 10 %
Neutro Abs: 8.7 10*3/uL — ABNORMAL HIGH (ref 1.7–7.7)
Neutrophils Relative %: 77 %
Platelets: 127 10*3/uL — ABNORMAL LOW (ref 150–400)
RBC: 4.44 MIL/uL (ref 4.22–5.81)
RDW: 12.7 % (ref 11.5–15.5)
WBC: 11.2 10*3/uL — ABNORMAL HIGH (ref 4.0–10.5)
nRBC: 0 % (ref 0.0–0.2)

## 2020-04-27 LAB — COMPREHENSIVE METABOLIC PANEL
ALT: 12 U/L (ref 0–44)
AST: 17 U/L (ref 15–41)
Albumin: 4.2 g/dL (ref 3.5–5.0)
Alkaline Phosphatase: 89 U/L (ref 38–126)
Anion gap: 7 (ref 5–15)
BUN: 18 mg/dL (ref 8–23)
CO2: 33 mmol/L — ABNORMAL HIGH (ref 22–32)
Calcium: 9.8 mg/dL (ref 8.9–10.3)
Chloride: 98 mmol/L (ref 98–111)
Creatinine, Ser: 0.98 mg/dL (ref 0.61–1.24)
GFR calc Af Amer: >60 mL/min (ref >60)
GFR calc non Af Amer: >60 mL/min (ref >60)
Glucose, Bld: 112 mg/dL — ABNORMAL HIGH (ref 70–99)
Potassium: 4.4 mmol/L (ref 3.5–5.1)
Sodium: 138 mmol/L (ref 135–145)
Total Bilirubin: 1 mg/dL (ref 0.3–1.2)
Total Protein: 7.7 g/dL (ref 6.5–8.1)

## 2020-04-27 LAB — LACTATE DEHYDROGENASE: LDH: 145 U/L (ref 98–192)

## 2020-04-27 NOTE — Assessment & Plan Note (Signed)
He managed to cut back on smoking a lot I continue to encourage the patient to quit smoking completely

## 2020-04-27 NOTE — Assessment & Plan Note (Signed)
This is likely due to fatty liver disease. The patient denies recent history of bleeding such as epistaxis, hematuria or hematochezia. He is asymptomatic from the low platelet count. I will observe for now.

## 2020-04-27 NOTE — Progress Notes (Signed)
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 Clinically, he has no signs or symptoms to suggest cancer recurrence His weight loss is unrelated I will see him back in 12 months with repeat history, physical examination and blood work I do not recommend routine surveillance imaging study  Thrombocytopenia ([place]) This is likely due to fatty liver disease. The patient denies recent history of bleeding such as epistaxis, hematuria or hematochezia. He is asymptomatic from the low platelet count. I will observe for now.  Tobacco abuse He managed to cut back on smoking a lot I continue to encourage the patient to quit smoking completely  Leukocytosis He has mild intermittent leukocytosis, likely reactive to his COPD/inhaler Observe only for now   No orders of the defined types were placed in this encounter.   All questions were answered. The patient knows to call the clinic with any problems, questions or concerns. The total time spent in the appointment was 20 minutes encounter with patients including review of chart and various tests results, discussions about plan of care and coordination of care plan   Heath Lark, MD 04/27/2020 2:05 PM  INTERVAL HISTORY: Please see below for problem oriented charting. He returns for his annual lymphoma follow-up Denies new lymphadenopathy No recent infection, fever or chills He has lost a lot of weight through  dietary modification He is also staying more active He has almost quit smoking completely Denies recent COPD exacerbation No recent signs or symptoms of congestive heart failure  SUMMARY OF ONCOLOGIC HISTORY: Oncology History Overview Note  FLIPI score of 2; stage 3 and areas of involvement >4   Follicular lymphoma ([place])  06/24/2009 Imaging   PET CT scan showed two small FDG positive left neck nodes   12/11/2014 Surgery   He underwent excisional lymph node biopsy of the left supraclavicular lymph node/neck region   12/11/2014 Pathology Results   Accession: [id number] biopsy show follicular lymphoma   1/91/4782 Imaging   ECHO showed LVH but preserved EF   12/25/2014 Imaging   PET scan showed disease above and below diaphragm   12/28/2014 Procedure   He has port placement   12/31/2014 - 01/01/2015 Chemotherapy   He received 1 cycle of bendamustine with rituximab, discontinued due to suspicion of allergic reaction to bendamustine   01/12/2015 - 01/19/2015 Hospital Admission   He was admitted to the hospital with suspicious allergic reaction, significant bilateral lower extremity edema with cellulitis, pneumonia and mild fluid overload   03/18/2015 - 04/08/2015 Chemotherapy   Treatment is switched to rituximab, Cytoxan, vincristine and prednisone   04/29/2015 Imaging    PET CT scan showed near complete response to treatment.   04/30/2015 - 01/12/2017 Chemotherapy   He received maintenance Rituxan   11/03/2016 PET scan   No metabolic evidence of recurrent lymphoma. Mild patchy marrow hypermetabolism throughout the axial skeleton, unchanged, probably due to a mildly reactive marrow state. 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   04/19/2017 Imaging   1. No evidence for lymphadenopathy in the chest, abdomen, or pelvis. 2. Aortic Atherosclerois (ICD10-170.0) 3. 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 4. Bilateral nonobstructing nephrolithiasis. 5. Cholelithiasis. 6. Hepatic steatosis   05/18/2017 Procedure   Successful removal of implanted Port-A-Cath     REVIEW OF SYSTEMS:   Constitutional: Denies fevers, chills  Eyes: Denies blurriness of vision Ears, nose, mouth, throat, and face: Denies mucositis or sore throat Respiratory: Denies cough, dyspnea or wheezes Cardiovascular: Denies palpitation, chest discomfort or lower extremity swelling Gastrointestinal:  Denies nausea, heartburn or change in bowel habits Skin: Denies abnormal skin rashes Lymphatics: Denies new lymphadenopathy or easy bruising Neurological:Denies numbness, tingling or new weaknesses Behavioral/Psych: Mood is stable, no new changes  All other systems were reviewed with the patient and are negative.  I have reviewed the past medical history, past surgical history, social history and family history with the patient and they are unchanged from previous note.  ALLERGIES:  is allergic to bendamustine hcl, heparin, and tape.  MEDICATIONS:  Current Outpatient Medications  Medication Sig Dispense Refill  . albuterol (PROVENTIL) (2.5 MG/3ML) 0.083% nebulizer solution Take 3 mLs (2.5 mg total) by nebulization daily as needed for wheezing or shortness of breath. 75 mL 11  . ANORO ELLIPTA 62.5-25 MCG/INH AEPB Inhale 1 puff into the lungs daily as needed. For wheezing & SOB 180 each 3  . aspirin EC 81 MG tablet Take 81 mg by mouth every evening.     . clonazePAM (KLONOPIN) 0.5 MG tablet TAKE 1 TABLET TWICE A DAY AS NEEDED FOR ANXIETY OR AT BEDTIME FOR INSOMNIA 60 tablet 3  . clopidogrel (PLAVIX) 75 MG tablet TAKE 1 TABLET BY MOUTH EVERY DAY WITH  BREAKFAST 90 tablet 3  . metoprolol succinate (TOPROL-XL) 50 MG 24 hr tablet Take 2 tablets (100 mg total) by mouth daily. 180 tablet 3  . nitroGLYCERIN (NITROSTAT) 0.4 MG SL tablet Place 1 tablet (0.4 mg total) under the tongue every 5 (five) minutes as needed for chest pain (Up to 3 doses). 25 tablet 1  . oxyCODONE-acetaminophen (PERCOCET/ROXICET) 5-325 MG tablet Take 1 tablet by mouth every 6 (six) hours as needed for severe pain. Please fill on or after 03/22/20 120 tablet 0  . oxyCODONE-acetaminophen (PERCOCET/ROXICET) 5-325 MG tablet Take 1 tablet by mouth every 6 (six) hours as needed for severe pain. 120 tablet 0  . oxyCODONE-acetaminophen (PERCOCET/ROXICET) 5-325 MG tablet Take 1 tablet by mouth every 6 (six) hours as needed for severe pain. 120 tablet 0  . oxymetazoline (AFRIN) 0.05 % nasal spray Place 1 spray into both nostrils daily as needed for congestion.    . potassium chloride SA (K-DUR) 20 MEQ tablet TAKE 1 TABLET BY MOUTH  DAILY 90 tablet 3  . simvastatin (ZOCOR) 20 MG tablet Take 1 tablet (20 mg total) by mouth at bedtime. 90 tablet 3  . torsemide (DEMADEX) 100 MG tablet Take 1 tablet (100 mg total) by mouth daily. 90 tablet 3   No current facility-administered medications for this visit.    PHYSICAL EXAMINATION: ECOG PERFORMANCE STATUS: 1 - Symptomatic but completely ambulatory  Vitals:   04/27/20 1350  BP: (!) 144/52  Pulse: (!) 51  Resp: 18  Temp: (!) 97.4 F (36.3 C)  SpO2: 96%   Filed Weights   04/27/20 1350  Weight: 259 lb 6.4 oz (117.7 kg)  GENERAL:alert, no distress and comfortable SKIN: Noted significant skin discoloration from previous cellulitis and skin bruises EYES: normal, Conjunctiva are pink and non-injected, sclera clear OROPHARYNX:no exudate, no erythema and lips, buccal mucosa, and tongue normal  NECK: supple, thyroid normal size, non-tender, without nodularity LYMPH:  no palpable lymphadenopathy in the cervical, axillary or  inguinal LUNGS: clear to auscultation and percussion with normal breathing effort HEART: regular rate & rhythm and no murmurs and no lower extremity edema ABDOMEN:abdomen soft, non-tender and normal bowel sounds Musculoskeletal:no cyanosis of digits and no clubbing  NEURO: alert & oriented x 3 with fluent speech, no focal motor/sensory deficits  LABORATORY DATA:  I have reviewed the data as listed    Component Value Date/Time   NA 138 04/27/2020 1327   NA 141 01/01/2018 0932   NA 141 04/19/2017 0822   K 4.4 04/27/2020 1327   K 4.1 04/19/2017 0822   CL 98 04/27/2020 1327   CO2 33 (H) 04/27/2020 1327   CO2 33 (H) 04/19/2017 0822   GLUCOSE 112 (H) 04/27/2020 1327   GLUCOSE 113 04/19/2017 0822   BUN 18 04/27/2020 1327   BUN 14 01/01/2018 0932   BUN 8.7 04/19/2017 0822   CREATININE 0.98 04/27/2020 1327   CREATININE 0.87 07/25/2018 1442   CREATININE 0.7 04/19/2017 0822   CALCIUM 9.8 04/27/2020 1327   CALCIUM 9.5 04/19/2017 0822   PROT 7.7 04/27/2020 1327   PROT 7.1 01/01/2018 0932   PROT 6.8 04/19/2017 0822   ALBUMIN 4.2 04/27/2020 1327   ALBUMIN 4.6 01/01/2018 0932   ALBUMIN 3.8 04/19/2017 0822   AST 17 04/27/2020 1327   AST 16 07/25/2018 1442   AST 18 04/19/2017 0822   ALT 12 04/27/2020 1327   ALT 15 07/25/2018 1442   ALT 21 04/19/2017 0822   ALKPHOS 89 04/27/2020 1327   ALKPHOS 82 04/19/2017 0822   BILITOT 1.0 04/27/2020 1327   BILITOT 0.3 07/25/2018 1442   BILITOT 0.53 04/19/2017 0822   GFRNONAA >60 04/27/2020 1327   GFRNONAA >60 07/25/2018 1442   GFRAA >60 04/27/2020 1327   GFRAA >60 07/25/2018 1442    No results found for: SPEP, UPEP  Lab Results  Component Value Date   WBC 11.2 (H) 04/27/2020   NEUTROABS 8.7 (H) 04/27/2020   HGB 14.9 04/27/2020   HCT 44.9 04/27/2020   MCV 101.1 (H) 04/27/2020   PLT 127 (L) 04/27/2020      Chemistry      Component Value Date/Time   NA 138 04/27/2020 1327   NA 141 01/01/2018 0932   NA 141 04/19/2017 0822   K 4.4  04/27/2020 1327   K 4.1 04/19/2017 0822   CL 98 04/27/2020 1327   CO2 33 (H) 04/27/2020 1327   CO2 33 (H) 04/19/2017 0822   BUN 18 04/27/2020 1327   BUN 14 01/01/2018 0932   BUN 8.7 04/19/2017 0822   CREATININE 0.98 04/27/2020 1327   CREATININE 0.87 07/25/2018 1442   CREATININE 0.7 04/19/2017 0822      Component Value Date/Time   CALCIUM 9.8 04/27/2020 1327   CALCIUM 9.5 04/19/2017 0822   ALKPHOS 89 04/27/2020 1327   ALKPHOS 82 04/19/2017 0822   AST 17 04/27/2020 1327   AST 16 07/25/2018 1442   AST 18 04/19/2017 0822   ALT 12 04/27/2020 1327   ALT 15 07/25/2018 1442   ALT 21 04/19/2017 0822   BILITOT 1.0 04/27/2020 1327   BILITOT 0.3 07/25/2018 1442   BILITOT 0.53 04/19/2017 0321

## 2020-04-27 NOTE — Assessment & Plan Note (Signed)
Last CT scan showed no signs of disease. Clinically, he has no signs or symptoms to suggest cancer recurrence His weight loss is unrelated I will see him back in 12 months with repeat history, physical examination and blood work I do not recommend routine surveillance imaging study

## 2020-04-27 NOTE — Assessment & Plan Note (Signed)
He has mild intermittent leukocytosis, likely reactive to his COPD/inhaler Observe only for now

## 2020-05-07 ENCOUNTER — Encounter: Payer: Self-pay | Admitting: Internal Medicine

## 2020-06-19 ENCOUNTER — Other Ambulatory Visit: Payer: Self-pay | Admitting: Internal Medicine

## 2020-06-22 ENCOUNTER — Encounter: Payer: Self-pay | Admitting: Internal Medicine

## 2020-06-22 ENCOUNTER — Other Ambulatory Visit: Payer: Self-pay

## 2020-06-22 ENCOUNTER — Ambulatory Visit (INDEPENDENT_AMBULATORY_CARE_PROVIDER_SITE_OTHER): Payer: Medicare Other | Admitting: Internal Medicine

## 2020-06-22 VITALS — BP 130/70 | HR 54 | Temp 98.8°F | Wt 260.0 lb

## 2020-06-22 DIAGNOSIS — I2583 Coronary atherosclerosis due to lipid rich plaque: Secondary | ICD-10-CM | POA: Diagnosis not present

## 2020-06-22 DIAGNOSIS — G629 Polyneuropathy, unspecified: Secondary | ICD-10-CM | POA: Insufficient documentation

## 2020-06-22 DIAGNOSIS — I2510 Atherosclerotic heart disease of native coronary artery without angina pectoris: Secondary | ICD-10-CM

## 2020-06-22 DIAGNOSIS — I6523 Occlusion and stenosis of bilateral carotid arteries: Secondary | ICD-10-CM

## 2020-06-22 DIAGNOSIS — E1151 Type 2 diabetes mellitus with diabetic peripheral angiopathy without gangrene: Secondary | ICD-10-CM

## 2020-06-22 DIAGNOSIS — I10 Essential (primary) hypertension: Secondary | ICD-10-CM | POA: Diagnosis not present

## 2020-06-22 DIAGNOSIS — Z23 Encounter for immunization: Secondary | ICD-10-CM | POA: Diagnosis not present

## 2020-06-22 DIAGNOSIS — G620 Drug-induced polyneuropathy: Secondary | ICD-10-CM

## 2020-06-22 MED ORDER — GABAPENTIN 300 MG PO CAPS: 300.0000 mg | ORAL_CAPSULE | Freq: Three times a day (TID) | ORAL | 3 refills | Status: DC

## 2020-06-22 MED ORDER — OXYCODONE-ACETAMINOPHEN 5-325 MG PO TABS
1.0000 | ORAL_TABLET | Freq: Four times a day (QID) | ORAL | 0 refills | Status: DC | PRN
Start: 2020-06-22 — End: 2020-08-05

## 2020-06-22 MED ORDER — METOPROLOL SUCCINATE ER 50 MG PO TB24
50.0000 mg | ORAL_TABLET | Freq: Every day | ORAL | 3 refills | Status: DC
Start: 2020-06-22 — End: 2020-08-30

## 2020-06-22 NOTE — Assessment & Plan Note (Signed)
Not better Increase GABAPENTIN

## 2020-06-22 NOTE — Progress Notes (Signed)
Subjective:  Patient ID: [name], male    DOB: Apr 07, 1956  Age: 64 y.o. MRN: [id number]  CC: Follow-up (3 Month F/U- Flu shot)   HPI [name] presents for B LE neuropathy, LBP, anxiety  Outpatient Medications Prior to Visit  Medication Sig Dispense Refill  . albuterol (PROVENTIL) (2.5 MG/3ML) 0.083% nebulizer solution Take 3 mLs (2.5 mg total) by nebulization daily as needed for wheezing or shortness of breath. 75 mL 11  . ANORO ELLIPTA 62.5-25 MCG/INH AEPB Inhale 1 puff into the lungs daily as needed. For wheezing & SOB 180 each 3  . aspirin EC 81 MG tablet Take 81 mg by mouth every evening.     . clonazePAM (KLONOPIN) 0.5 MG tablet TAKE 1 TABLET TWICE A DAY AS NEEDED FOR ANXIETY OR AT BEDTIME FOR INSOMNIA 60 tablet 3  . clopidogrel (PLAVIX) 75 MG tablet TAKE 1 TABLET BY MOUTH EVERY DAY WITH BREAKFAST 90 tablet 3  . gabapentin (NEURONTIN) 100 MG capsule Take 100 mg by mouth 3 (three) times daily.    . metoprolol succinate (TOPROL-XL) 50 MG 24 hr tablet Take 2 tablets (100 mg total) by mouth daily. 180 tablet 3  . oxyCODONE-acetaminophen (PERCOCET/ROXICET) 5-325 MG tablet Take 1 tablet by mouth every 6 (six) hours as needed for severe pain. 120 tablet 0  . oxymetazoline (AFRIN) 0.05 % nasal spray Place 1 spray into both nostrils daily as needed for congestion.    . potassium chloride SA (K-DUR) 20 MEQ tablet TAKE 1 TABLET BY MOUTH  DAILY 90 tablet 3  . simvastatin (ZOCOR) 20 MG tablet Take 1 tablet (20 mg total) by mouth at bedtime. 90 tablet 3  . torsemide (DEMADEX) 100 MG tablet Take 1 tablet (100 mg total) by mouth daily. 90 tablet 3  . nitroGLYCERIN (NITROSTAT) 0.4 MG SL tablet Place 1 tablet (0.4 mg total) under the tongue every 5 (five) minutes as needed for chest pain (Up to 3 doses). 25 tablet 1  . oxyCODONE-acetaminophen (PERCOCET/ROXICET) 5-325 MG tablet Take 1 tablet by mouth every 6 (six) hours as needed for severe pain. Please fill on or after 03/22/20  120 tablet 0  . oxyCODONE-acetaminophen (PERCOCET/ROXICET) 5-325 MG tablet Take 1 tablet by mouth every 6 (six) hours as needed for severe pain. 120 tablet 0   No facility-administered medications prior to visit.    ROS: Review of Systems  Constitutional: Positive for unexpected weight change. Negative for appetite change and fatigue.  HENT: Negative for congestion, nosebleeds, sneezing, sore throat and trouble swallowing.   Eyes: Negative for itching and visual disturbance.  Respiratory: Negative for cough.   Cardiovascular: Negative for chest pain, palpitations and leg swelling.  Gastrointestinal: Negative for abdominal distention, blood in stool, diarrhea and nausea.  Genitourinary: Negative for frequency and hematuria.  Musculoskeletal: Positive for arthralgias and back pain. Negative for gait problem, joint swelling and neck pain.  Skin: Negative for rash.  Neurological: Negative for dizziness, tremors, speech difficulty and weakness.  Hematological: Bruises/bleeds easily.  Psychiatric/Behavioral: Negative for agitation, dysphoric mood and sleep disturbance. The patient is nervous/anxious.     Objective:  BP 130/70 (BP Location: Left Arm)   Pulse (!) 54   Temp 98.8 F (37.1 C) (Oral)   Wt 260 lb (117.9 kg)   SpO2 95%   BMI 35.26 kg/m   BP Readings from Last 3 Encounters:  06/22/20 130/70  04/27/20 (!) 144/52  03/22/20 (!) 140/56    Wt Readings from Last 3 Encounters:  06/22/20 260 lb (117.9 kg)  04/27/20 259 lb 6.4 oz (117.7 kg)  03/22/20 280 lb (127 kg)    Physical Exam Constitutional:      General: He is not in acute distress.    Appearance: He is well-developed. He is obese.     Comments: NAD  Eyes:     Conjunctiva/sclera: Conjunctivae normal.     Pupils: Pupils are equal, round, and reactive to light.  Neck:     Thyroid: No thyromegaly.     Vascular: No JVD.  Cardiovascular:     Rate and Rhythm: Normal rate and regular rhythm.     Heart sounds: Normal  heart sounds. No murmur heard.  No friction rub. No gallop.   Pulmonary:     Effort: Pulmonary effort is normal. No respiratory distress.     Breath sounds: Normal breath sounds. No wheezing or rales.  Chest:     Chest wall: No tenderness.  Abdominal:     General: Bowel sounds are normal. There is no distension.     Palpations: Abdomen is soft. There is no mass.     Tenderness: There is no abdominal tenderness. There is no guarding or rebound.  Musculoskeletal:        General: No tenderness. Normal range of motion.     Cervical back: Normal range of motion.     Right lower leg: Edema present.     Left lower leg: Edema present.  Lymphadenopathy:     Cervical: No cervical adenopathy.  Skin:    General: Skin is warm and dry.     Findings: No rash.  Neurological:     Mental Status: He is alert and oriented to person, place, and time.     Cranial Nerves: No cranial nerve deficit.     Motor: No abnormal muscle tone.     Coordination: Coordination normal.     Gait: Gait normal.     Deep Tendon Reflexes: Reflexes are normal and symmetric.  Psychiatric:        Behavior: Behavior normal.        Thought Content: Thought content normal.        Judgment: Judgment normal.     Lab Results  Component Value Date   WBC 11.2 (H) 04/27/2020   HGB 14.9 04/27/2020   HCT 44.9 04/27/2020   PLT 127 (L) 04/27/2020   GLUCOSE 112 (H) 04/27/2020   CHOL 108 01/01/2018   TRIG 98 01/01/2018   HDL 35 (L) 01/01/2018   LDLCALC 53 01/01/2018   ALT 12 04/27/2020   AST 17 04/27/2020   NA 138 04/27/2020   K 4.4 04/27/2020   CL 98 04/27/2020   CREATININE 0.98 04/27/2020   BUN 18 04/27/2020   CO2 33 (H) 04/27/2020   TSH 0.80 02/17/2020   PSA 0.23 09/15/2011   INR 1.0 11/28/2017   HGBA1C 5.6 02/17/2020   MICROALBUR 0.7 03/29/2016    VAS US CAROTID  Result Date: 01/22/2020 Carotid Arterial Duplex Study Indications:       Left endarterectomy and bilateral carotid artery stenosis.                     Patient denies any cerebrovascular symptoms. Risk Factors:      Hypertension, hyperlipidemia, Diabetes, past history of                    smoking, coronary artery disease. Other Factors:     CABG, COPD. Comparison Study:  In  01/2019, a carotid duplex performed at showed velocities                    of 119/36 cm/s in the RICA and 245/80 cm/s in the LICA. Performing Technologist: Sharlett Iles RVT  Examination Guidelines: A complete evaluation includes B-mode imaging, spectral Doppler, color Doppler, and power Doppler as needed of all accessible portions of each vessel. Bilateral testing is considered an integral part of a complete examination. Limited examinations for reoccurring indications may be performed as noted.  Right Carotid Findings: +----------+--------+--------+--------+--------------------------+--------+           PSV cm/sEDV cm/sStenosisPlaque Description        Comments +----------+--------+--------+--------+--------------------------+--------+ CCA Prox  193     15              heterogenous                       +----------+--------+--------+--------+--------------------------+--------+ CCA Mid   62      7               heterogenous and irregular         +----------+--------+--------+--------+--------------------------+--------+ CCA Distal81      9               hyperechoic                        +----------+--------+--------+--------+--------------------------+--------+ ICA Prox  100     18      1-39%   heterogenous and irregular         +----------+--------+--------+--------+--------------------------+--------+ ICA Mid   96      22                                                 +----------+--------+--------+--------+--------------------------+--------+ ICA Distal70      18                                                 +----------+--------+--------+--------+--------------------------+--------+ ECA       297     17      >50%    heterogenous  and irregular         +----------+--------+--------+--------+--------------------------+--------+ +----------+--------+-------+---------+-------------------+           PSV cm/sEDV cmsDescribe Arm Pressure (mmHG) +----------+--------+-------+---------+-------------------+ [name]            Turbulent156                 +----------+--------+-------+---------+-------------------+ +---------+--------+--+--------+-+---------+ VertebralPSV cm/s38EDV cm/s4Antegrade +---------+--------+--+--------+-+---------+ Velocities in the RICA remain within normal range and stable compared to the prior exam. Left Carotid Findings: +----------+--------+--------+--------+------------------+------------------+           PSV cm/sEDV cm/sStenosisPlaque DescriptionComments           +----------+--------+--------+--------+------------------+------------------+ CCA Prox  103     8                                                    +----------+--------+--------+--------+------------------+------------------+ CCA Mid   78      9  heterogenous                         +----------+--------+--------+--------+------------------+------------------+ CCA Distal56      9                                                    +----------+--------+--------+--------+------------------+------------------+ ICA Prox  68      13              heterogenous                         +----------+--------+--------+--------+------------------+------------------+ ICA Mid   76      17      1-39%                     s/p endarterectomy +----------+--------+--------+--------+------------------+------------------+ ICA Distal76      24                                                   +----------+--------+--------+--------+------------------+------------------+ ECA       286     13      >50%    heterogenous                          +----------+--------+--------+--------+------------------+------------------+ +----------+--------+--------+--------+-------------------+           PSV cm/sEDV cm/sDescribeArm Pressure (mmHG) +----------+--------+--------+--------+-------------------+ [name]             Stenotic150                 +----------+--------+--------+--------+-------------------+ +---------+--------+--+--------+--+---------+ VertebralPSV cm/s64EDV cm/s10Antegrade +---------+--------+--+--------+--+---------+ Velocities in the LICA remain within normal range and stable compared to the prior exam, s/p endarterectomy.  Summary: Right Carotid: Velocities in the right ICA are consistent with a 1-39% stenosis.                Non-hemodynamically significant plaque <50% noted in the CCA.                The ECA appears >50% stenosed. Left Carotid: Velocities in the left ICA are consistent with a 1-39% stenosis.               Non-hemodynamically significant plaque <50% noted in the CCA.               The ECA appears >50% stenosed. Vertebrals:  Bilateral vertebral arteries demonstrate antegrade flow. Subclavians: Left subclavian artery was stenotic. Right subclavian artery flow              was disturbed. *See table(s) above for measurements and observations. Suggest follow up study in 12 months. Electronically signed by Ida Rogue MD on 01/22/2020 at 5:32:00 PM.    Final     Assessment & Plan:    Walker Kehr, MD

## 2020-06-22 NOTE — Assessment & Plan Note (Addendum)
Better w/wt loss Labs

## 2020-06-22 NOTE — Assessment & Plan Note (Signed)
Loosing wt on diet

## 2020-06-22 NOTE — Patient Instructions (Signed)
Reduce Metoprolol to one a day

## 2020-06-22 NOTE — Assessment & Plan Note (Signed)
BP Readings from Last 3 Encounters:  06/22/20 130/70  04/27/20 (!) 144/52  03/22/20 (!) 140/56  Low BP after wt loss - reduce Metoprolol to one a day

## 2020-06-22 NOTE — Assessment & Plan Note (Signed)
No angina 

## 2020-07-06 MED ORDER — SIMVASTATIN 20 MG PO TABS
20.0000 mg | ORAL_TABLET | Freq: Every day | ORAL | 0 refills | Status: DC
Start: 2020-07-06 — End: 2020-09-18

## 2020-07-09 ENCOUNTER — Other Ambulatory Visit: Payer: Self-pay | Admitting: Internal Medicine

## 2020-07-21 MED ORDER — POTASSIUM CHLORIDE CRYS ER 20 MEQ PO TBCR
20.0000 meq | EXTENDED_RELEASE_TABLET | Freq: Every day | ORAL | 3 refills | Status: DC
Start: 2020-07-21 — End: 2020-12-20

## 2020-07-22 NOTE — Progress Notes (Signed)
CARDIOLOGY OFFICE NOTE  Date:  07/26/2020    [name] Date of Birth: 1955-08-17 Medical Record #[id number]  PCP:  [name], MD  Cardiologist:  Johnsie Cancel    History of Present Illness: [name] is a 64 y.o. male  With a history of obesity, known CAD with prior CABG in 2005, left CEA 2009, HTN, HLD, GERD, OSA, COPD and follicular lymphoma diagnosed in 2016.   Cath 09/29/11 with patent LIMA to LAD. Received DES to SVG Ramus and DES to SVG PDA at that time.   TTE 11/23/17 with normal EF at 55 to 67%, normal diastolic parameters and no significant valve disease. Saw me 11/28/17 - fatigued, some dyspnea and right sided chest pain endorsed. 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 Anginal pain ([place])    not had to use in awhile  none in 10 years  . Anxiety   . Basal cell carcinoma of nose   . Blood dyscrasia    trouble clotting   . Carotid artery disease ([place])    s/p L CEA 2009 (hx of evacuation of hematoma due to heparin)  . CHF (congestive heart failure) ([place])   . Chronic lower back pain   . COPD (chronic obstructive pulmonary disease) ([place])   . Coronary artery disease    CABG 2005. s/p PTCA and stenting of the saphenous vein graft to PDA and saphenous vein graft to obtuse marginal by Dr. Burt Knack 09/29/11. Normal EF at cath 09/2011  . Depression   . Diabetes mellitus without complication (HCC)    borderline   . Dysrhythmia    fluttering  . Edema   . GERD (gastroesophageal  reflux disease)   . Heart murmur   . 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 History of home oxygen therapy    2 liters at night prn  . Hyperlipidemia   . Hypertension   . Lymphoma ([place]) 12/21/2014  . Myocardial infarction ([place])   . Obesity   . Shortness of breath dyspnea    with exertion   . Sleep apnea    "used to"      could not use 2006    Past Surgical History:  Procedure Laterality Date  . BASAL CELL CARCINOMA EXCISION  2000's   nose  . CARDIAC CATHETERIZATION  12/07/2017  . CAROTID ENDARTERECTOMY  03/2008   left  . CORONARY ANGIOPLASTY WITH STENT PLACEMENT  09/29/11   "2"  . CORONARY ARTERY BYPASS GRAFT  2005   CABG X3  . CORONARY STENT INTERVENTION  12/07/2017  . CORONARY STENT INTERVENTION N/A 12/07/2017   Procedure: CORONARY STENT INTERVENTION;  Surgeon: Burnell Blanks, MD;  Location: [facility];  Service: Cardiovascular;  Laterality: N/A;  . evacuation of hematoma  03/2008   left neck; S/P endarterectomy; "cause heparin clotted it up"  . I & D EXTREMITY Left 02/11/2015   Procedure: IRRIGATION AND DEBRIDEMENT LEFT LONG  FINGER;  Surgeon: Leanora Cover, MD;  Location: [facility];  Service: Orthopedics;  Laterality: Left;  I and D Left Long Finger  . IR REMOVAL TUN ACCESS W/ PORT W/O FL MOD SED  05/18/2017  . MASS EXCISION Left 12/11/2014   Procedure: EXCISIONAL BIOPSY OF LEFT SUPRA CLAVICULAR NECK MASS;  Surgeon: Jerrell Belfast, MD;  Location: [facility];  Service: ENT;  Laterality: Left;  . PERCUTANEOUS CORONARY STENT INTERVENTION (PCI-S) N/A 09/29/2011   Procedure: PERCUTANEOUS CORONARY STENT INTERVENTION (PCI-S);  Surgeon: Sherren Mocha, MD;  Location: [facility];  Service: Cardiovascular;  Laterality: N/A;  . PORTACATH PLACEMENT  2016  . RIGHT/LEFT HEART CATH AND CORONARY/GRAFT ANGIOGRAPHY N/A 12/07/2017   Procedure: RIGHT/LEFT HEART CATH AND CORONARY/GRAFT ANGIOGRAPHY;  Surgeon: Burnell Blanks, MD;  Location: [facility];   Service: Cardiovascular;  Laterality: N/A;  . SUPERFICIAL LYMPH NODE BIOPSY / EXCISION Left      Medications: Current Meds  Medication Sig  . albuterol (PROVENTIL) (2.5 MG/3ML) 0.083% nebulizer solution Take 3 mLs (2.5 mg total) by nebulization daily as needed for wheezing or shortness of breath.  [name] ELLIPTA 62.5-25 MCG/INH AEPB Inhale 1 puff into the lungs daily as needed. For wheezing & SOB  . aspirin EC 81 MG tablet Take 81 mg by mouth every evening.   . clonazePAM (KLONOPIN) 0.5 MG tablet TAKE 1 TABLET TWICE A DAY AS NEEDED FOR ANXIETY OR AT BEDTIME FOR INSOMNIA  . clopidogrel (PLAVIX) 75 MG tablet TAKE 1 TABLET BY MOUTH EVERY DAY WITH BREAKFAST  . gabapentin (NEURONTIN) 300 MG capsule Take 1 capsule (300 mg total) by mouth 3 (three) times daily.  . metolazone (ZAROXOLYN) 5 MG tablet TAKE 1 TABLET BY MOUTH EVERY DAY  . metoprolol succinate (TOPROL-XL) 50 MG 24 hr tablet Take 1 tablet (50 mg total) by mouth daily.  Marland Kitchen oxyCODONE-acetaminophen (PERCOCET/ROXICET) 5-325 MG tablet Take 1 tablet by mouth every 6 (six) hours as needed for severe pain.  Marland Kitchen oxymetazoline (AFRIN) 0.05 % nasal spray Place 1 spray into both nostrils daily as needed for congestion.  . potassium chloride SA (KLOR-CON) 20 MEQ tablet Take 1 tablet (20 mEq total) by mouth daily.  . simvastatin (ZOCOR) 20 MG tablet Take 1 tablet (20 mg total) by mouth at bedtime.  . torsemide (DEMADEX) 100 MG tablet Take 1 tablet (100 mg total) by mouth daily.     Allergies: Allergies  Allergen Reactions  . Bendamustine Hcl Rash    See hospital notes from 01/12/15  . Heparin Other (See Comments)    Opposite reaction Heparin induced thrombocytopenia Other reaction(s): Heparin-Induced Thrombocytopenia  . Tape Rash    Social History: The patient  reports that he quit smoking about 2 years ago. His smoking use included cigarettes. He has a 82.00 pack-year smoking history. He has quit using smokeless tobacco.  His smokeless  tobacco use included chew. He reports current drug use. Drug: Marijuana. He reports that he does not drink alcohol.   Family History: The patient's family history includes Bone cancer in his mother; Heart disease in his father.   Review of Systems: Please see the history of present illness.   Otherwise, the review of systems is positive for none.   All other systems are reviewed and negative.   Physical Exam: VS:  BP 128/82   Pulse (!) 57   Ht 6' (1.829 m)   Wt 118.8 kg   BMI 35.53 kg/m  .  BMI Body mass index is  35.53 kg/m.  Wt Readings from Last 3 Encounters:  07/26/20 118.8 kg  06/22/20 117.9 kg  04/27/20 117.7 kg    Affect appropriate Obese white male  HEENT: normal Neck supple with no adenopathy JVP normal no bruits no thyromegaly post left CEA  Lungs mild expiratory wheezing  Heart:  S1/S2 SEM radiates to both carotids no diastolic  murmur, no rub, gallop or click PMI normal Abdomen: benighn, BS positve, no tenderness, no AAA no bruit.  No HSM or HJR Distal pulses intact with no bruits Plus 2 edema with stasis changes  Neuro non-focal Skin warm and dry No muscular weakness Previous tunneled catheter removed     LABORATORY DATA:  EKG:  SR RBBB no acute changes 07/26/2020   Lab Results  Component Value Date   WBC 11.2 (H) 04/27/2020   HGB 14.9 04/27/2020   HCT 44.9 04/27/2020   PLT 127 (L) 04/27/2020   GLUCOSE 112 (H) 04/27/2020   CHOL 108 01/01/2018   TRIG 98 01/01/2018   HDL 35 (L) 01/01/2018   LDLCALC 53 01/01/2018   ALT 12 04/27/2020   AST 17 04/27/2020   NA 138 04/27/2020   K 4.4 04/27/2020   CL 98 04/27/2020   CREATININE 0.98 04/27/2020   BUN 18 04/27/2020   CO2 33 (H) 04/27/2020   TSH 0.80 02/17/2020   PSA 0.23 09/15/2011   INR 1.0 11/28/2017   HGBA1C 5.6 02/17/2020   MICROALBUR 0.7 03/29/2016     BNP (last 3 results) No results for input(s): BNP in the last 8760 hours.  ProBNP (last 3 results) No results for input(s): PROBNP in  the last 8760 hours.   Other Studies Reviewed Today:  Procedures   CORONARY STENT INTERVENTION 12/07/2017  RIGHT/LEFT HEART CATH AND CORONARY/GRAFT ANGIOGRAPHY  Conclusion     Prox RCA lesion is 100% stenosed.  SVG graft was visualized by angiography and is normal in caliber.  Prox Graft lesion is 99% stenosed.  A drug-eluting stent was successfully placed using a STENT RESOLUTE ONYX 4.0X22.  Post intervention, there is a 0% residual stenosis.  Mid LM to Ost LAD lesion is 50% stenosed.  Ost 1st Mrg lesion is 100% stenosed.  Origin to Prox Graft lesion is 100% stenosed.  Ost LAD to Prox LAD lesion is 90% stenosed.  Hemodynamic findings consistent with moderate pulmonary hypertension.   1. Severe triple vessel CAD s/p 3V CABG with 2/3 patent bypass grafts 2. The left main has a distal 50% stenosis.  3. The LAD has severe proximal stenosis. The mid and distal LAD fills from the patent LIMA graft.  4. The Circumflex has mild diffuse disease. The intermediate is a large caliber vessel with proximal occlusion. The vein graft to the intermediate branch is occluded in the proximal stent.  5. The RCA is occluded in the proximal segment. The distal vessel fills from the patent vein graft. The stent in the proximal segment of the vein graft has severe in stent restenosis.  6. Elevated filling pressures  Recommendations: Will continue DAPT with ASA and Plavix. Continue beta blocker and statin. He would benefit from additional diuresis.     Echo Study Conclusions April 2019  - Left ventricle: The cavity size was mildly dilated. There was   moderate concentric hypertrophy. Systolic function was normal.   The estimated ejection fraction was in the range of 55% to 60%.   Wall motion was normal; there were no regional wall motion   abnormalities. Left ventricular diastolic function parameters  were normal. - Aortic valve: There was trivial regurgitation. - Aorta: Aortic root  dimension: 40 mm (ED). - Aortic root: The aortic root was mildly dilated. - Left atrium: The atrium was moderately dilated. - Right ventricle: The cavity size was mildly dilated. Wall   thickness was normal. Systolic function was mildly reduced.    Result Notes for VAS US CAROTID   Notes recorded by Michaelyn Barter, RN on 12/31/2017 at 2:54 PM EDT Patient aware of carotid results. Per Dr. Johnsie Cancel, plaque no stenosis f/u carotid duplex in 2 years. Patient verbalized understanding. Will send copy to patient's PCP. ------      Assessment/Plan:  1. CAD with remote CABG in 2005, stent to SVG to IM and SVG to PDA in 2013 - 12/07/17 s/p PCI with DES to SVG to RCA - remains on DAPT. No angina stable      2. HTN - Well controlled.  Continue current medications and low sodium Dash type diet.    3. HLD - on statin -   LDL 53 01/01/18  Repeat labs ordered   4. Lymphoma - remission Dr Alvy Bimler to follow PLTls/WBC port a cath removed no routine imaging   5. Carotid stenosis - plaque no stenosis on duplex 01/22/20  History of left CEA F/U duplex June 2023   6. Murmur - Echo 11/23/17  with no significant valve disease  7. COPD congratulated him on smoking cessation f/u pulmonary   8. Obesity discussed portion size and low carb diet with exercise   Current medicines are reviewed with the patient today.  The patient does not have concerns regarding medicines other than what has been noted above.  The following changes have been made:  See above.  Labs/ tests ordered today include:   Lipid and Liver  Carotids June 2023   No orders of the defined types were placed in this encounter.   Disposition:   FU in a year   Jenkins Rouge

## 2020-07-26 ENCOUNTER — Other Ambulatory Visit: Payer: Self-pay

## 2020-07-26 ENCOUNTER — Ambulatory Visit (INDEPENDENT_AMBULATORY_CARE_PROVIDER_SITE_OTHER): Payer: Medicare Other | Admitting: Cardiovascular Disease

## 2020-07-26 ENCOUNTER — Encounter: Payer: Self-pay | Admitting: Cardiovascular Disease

## 2020-07-26 VITALS — BP 128/82 | HR 57 | Ht 72.0 in | Wt 262.0 lb

## 2020-07-26 DIAGNOSIS — I6523 Occlusion and stenosis of bilateral carotid arteries: Secondary | ICD-10-CM | POA: Diagnosis not present

## 2020-07-26 DIAGNOSIS — I25810 Atherosclerosis of coronary artery bypass graft(s) without angina pectoris: Secondary | ICD-10-CM | POA: Diagnosis not present

## 2020-07-26 DIAGNOSIS — I779 Disorder of arteries and arterioles, unspecified: Secondary | ICD-10-CM

## 2020-07-26 DIAGNOSIS — E785 Hyperlipidemia, unspecified: Secondary | ICD-10-CM

## 2020-07-26 NOTE — Patient Instructions (Addendum)

## 2020-08-05 ENCOUNTER — Other Ambulatory Visit: Payer: Self-pay | Admitting: Internal Medicine

## 2020-08-05 MED ORDER — OXYCODONE-ACETAMINOPHEN 5-325 MG PO TABS: 1.0000 | ORAL_TABLET | Freq: Four times a day (QID) | ORAL | 0 refills | Status: DC | PRN

## 2020-08-17 ENCOUNTER — Other Ambulatory Visit: Payer: Self-pay | Admitting: Internal Medicine

## 2020-08-30 ENCOUNTER — Other Ambulatory Visit: Payer: Self-pay | Admitting: Internal Medicine

## 2020-09-14 ENCOUNTER — Other Ambulatory Visit (INDEPENDENT_AMBULATORY_CARE_PROVIDER_SITE_OTHER): Payer: Medicare Other

## 2020-09-14 DIAGNOSIS — I2510 Atherosclerotic heart disease of native coronary artery without angina pectoris: Secondary | ICD-10-CM

## 2020-09-14 DIAGNOSIS — I10 Essential (primary) hypertension: Secondary | ICD-10-CM

## 2020-09-14 DIAGNOSIS — G620 Drug-induced polyneuropathy: Secondary | ICD-10-CM | POA: Diagnosis not present

## 2020-09-14 DIAGNOSIS — I2583 Coronary atherosclerosis due to lipid rich plaque: Secondary | ICD-10-CM | POA: Diagnosis not present

## 2020-09-14 DIAGNOSIS — E1151 Type 2 diabetes mellitus with diabetic peripheral angiopathy without gangrene: Secondary | ICD-10-CM

## 2020-09-14 LAB — CBC WITH DIFFERENTIAL/PLATELET
Basophils Absolute: 0.1 10*3/uL (ref 0.0–0.1)
Basophils Relative: 1.2 % (ref 0.0–3.0)
Eosinophils Absolute: 0.4 10*3/uL (ref 0.0–0.7)
Eosinophils Relative: 4.5 % (ref 0.0–5.0)
HCT: 39.7 % (ref 39.0–52.0)
Hemoglobin: 13.4 g/dL (ref 13.0–17.0)
Lymphocytes Relative: 11 % — ABNORMAL LOW (ref 12.0–46.0)
Lymphs Abs: 0.9 10*3/uL (ref 0.7–4.0)
MCHC: 33.8 g/dL (ref 30.0–36.0)
MCV: 100.4 fl — ABNORMAL HIGH (ref 78.0–100.0)
Monocytes Absolute: 0.9 10*3/uL (ref 0.1–1.0)
Monocytes Relative: 11.1 % (ref 3.0–12.0)
Neutro Abs: 6 10*3/uL (ref 1.4–7.7)
Neutrophils Relative %: 72.2 % (ref 43.0–77.0)
Platelets: 105 10*3/uL — ABNORMAL LOW (ref 150.0–400.0)
RBC: 3.96 Mil/uL — ABNORMAL LOW (ref 4.22–5.81)
RDW: 13.8 % (ref 11.5–15.5)
WBC: 8.3 10*3/uL (ref 4.0–10.5)

## 2020-09-14 LAB — COMPREHENSIVE METABOLIC PANEL
ALT: 10 U/L (ref 0–53)
AST: 14 U/L (ref 0–37)
Albumin: 4.4 g/dL (ref 3.5–5.2)
Alkaline Phosphatase: 66 U/L (ref 39–117)
BUN: 17 mg/dL (ref 6–23)
CO2: 35 mEq/L — ABNORMAL HIGH (ref 19–32)
Calcium: 9.6 mg/dL (ref 8.4–10.5)
Chloride: 102 mEq/L (ref 96–112)
Creatinine, Ser: 0.77 mg/dL (ref 0.40–1.50)
GFR: 93.26 mL/min (ref >60.00)
Glucose, Bld: 97 mg/dL (ref 70–99)
Potassium: 5 mEq/L (ref 3.5–5.1)
Sodium: 139 mEq/L (ref 135–145)
Total Bilirubin: 0.4 mg/dL (ref 0.2–1.2)
Total Protein: 7 g/dL (ref 6.0–8.3)

## 2020-09-14 LAB — HEMOGLOBIN A1C: Hgb A1c MFr Bld: 5.4 % (ref 4.6–6.5)

## 2020-09-17 ENCOUNTER — Other Ambulatory Visit: Payer: Self-pay | Admitting: Internal Medicine

## 2020-09-18 ENCOUNTER — Other Ambulatory Visit: Payer: Self-pay | Admitting: Internal Medicine

## 2020-09-20 ENCOUNTER — Other Ambulatory Visit: Payer: Self-pay

## 2020-09-21 ENCOUNTER — Ambulatory Visit (INDEPENDENT_AMBULATORY_CARE_PROVIDER_SITE_OTHER): Payer: Medicare Other | Admitting: Internal Medicine

## 2020-09-21 ENCOUNTER — Encounter: Payer: Self-pay | Admitting: Internal Medicine

## 2020-09-21 DIAGNOSIS — E1151 Type 2 diabetes mellitus with diabetic peripheral angiopathy without gangrene: Secondary | ICD-10-CM

## 2020-09-21 DIAGNOSIS — I10 Essential (primary) hypertension: Secondary | ICD-10-CM | POA: Diagnosis not present

## 2020-09-21 DIAGNOSIS — M79604 Pain in right leg: Secondary | ICD-10-CM

## 2020-09-21 DIAGNOSIS — M545 Low back pain, unspecified: Secondary | ICD-10-CM | POA: Diagnosis not present

## 2020-09-21 DIAGNOSIS — J449 Chronic obstructive pulmonary disease, unspecified: Secondary | ICD-10-CM | POA: Diagnosis not present

## 2020-09-21 DIAGNOSIS — I2510 Atherosclerotic heart disease of native coronary artery without angina pectoris: Secondary | ICD-10-CM

## 2020-09-21 DIAGNOSIS — I2583 Coronary atherosclerosis due to lipid rich plaque: Secondary | ICD-10-CM | POA: Diagnosis not present

## 2020-09-21 DIAGNOSIS — M79605 Pain in left leg: Secondary | ICD-10-CM

## 2020-09-21 MED ORDER — OXYCODONE-ACETAMINOPHEN 5-325 MG PO TABS: 1.0000 | ORAL_TABLET | Freq: Four times a day (QID) | ORAL | 0 refills | Status: DC | PRN

## 2020-09-21 NOTE — Progress Notes (Signed)
Something you had last year to   Subjective:  Patient ID: [name], male    DOB: [birth year]  Age: 65 y.o. MRN: [id number]  CC: Follow-up (3 months f/u)   HPI [name] presents for CAD, LBP, anxiety f/u AP  Outpatient Medications Prior to Visit  Medication Sig Dispense Refill  . albuterol (PROVENTIL) (2.5 MG/3ML) 0.083% nebulizer solution TAKE 3 MLS (2.5 MG TOTAL) BY NEBULIZATION DAILY AS NEEDED FOR WHEEZING OR SHORTNESS OF BREATH. 75 mL 5  . ANORO ELLIPTA 62.5-25 MCG/INH AEPB Inhale 1 puff into the lungs daily as needed. For wheezing & SOB 180 each 3  . aspirin EC 81 MG tablet Take 81 mg by mouth every evening.     . clonazePAM (KLONOPIN) 0.5 MG tablet TAKE 1 TABLET TWICE A DAY AS NEEDED FOR ANXIETY OR AT BEDTIME FOR INSOMNIA 60 tablet 3  . clopidogrel (PLAVIX) 75 MG tablet TAKE 1 TABLET BY MOUTH EVERY DAY WITH BREAKFAST 90 tablet 3  . gabapentin (NEURONTIN) 300 MG capsule Take 1 capsule (300 mg total) by mouth 3 (three) times daily. 90 capsule 3  . metolazone (ZAROXOLYN) 5 MG tablet TAKE 1 TABLET BY MOUTH EVERY DAY 30 tablet 11  . metoprolol succinate (TOPROL-XL) 50 MG 24 hr tablet TAKE 2 TABLETS(100MG  TOTAL)DAILY 180 tablet 3  . oxyCODONE-acetaminophen (PERCOCET/ROXICET) 5-325 MG tablet Take 1 tablet by mouth every 6 (six) hours as needed for severe pain. 120 tablet 0  . oxyCODONE-acetaminophen (PERCOCET/ROXICET) 5-325 MG tablet Take 1 tablet by mouth every 6 (six) hours as needed for severe pain. 120 tablet 0  . oxymetazoline (AFRIN) 0.05 % nasal spray Place 1 spray into both nostrils daily as needed for congestion.    . potassium chloride SA (KLOR-CON) 20 MEQ tablet Take 1 tablet (20 mEq total) by mouth daily. 90 tablet 3  . simvastatin (ZOCOR) 20 MG tablet TAKE 1 TABLET AT BEDTIME 90 tablet 3  . torsemide (DEMADEX) 100 MG tablet Take 1 tablet (100 mg total) by mouth daily. 90 tablet 3  . nitroGLYCERIN (NITROSTAT) 0.4 MG SL tablet Place 1 tablet (0.4 mg total)  under the tongue every 5 (five) minutes as needed for chest pain (Up to 3 doses). 25 tablet 1   Blevins facility-administered medications prior to visit.    ROS: Review of Systems  Constitutional: Positive for fatigue and unexpected weight change. Negative for appetite change.  HENT: Negative for congestion, nosebleeds, sneezing, sore throat and trouble swallowing.   Eyes: Negative for itching and visual disturbance.  Respiratory: Negative for cough.   Cardiovascular: Positive for leg swelling. Negative for chest pain and palpitations.  Gastrointestinal: Negative for abdominal distention, blood in stool, diarrhea and nausea.  Genitourinary: Negative for frequency and hematuria.  Musculoskeletal: Positive for arthralgias and back pain. Negative for gait problem, joint swelling and neck pain.  Skin: Positive for rash.  Neurological: Negative for dizziness, tremors, speech difficulty and weakness.  Psychiatric/Behavioral: Negative for agitation, dysphoric mood and sleep disturbance. The patient is not nervous/anxious.     Objective:  BP (!) 142/70 (BP Location: Left Arm)   Pulse 73   Temp 98.6 F (37 C) (Oral)   Ht 6' (1.829 m)   Wt 286 lb 6.4 oz (129.9 kg)   SpO2 94%   BMI 38.84 kg/m   BP Readings from Last 3 Encounters:  09/21/20 (!) 142/70  07/26/20 128/82  06/22/20 130/70    Wt Readings from Last 3 Encounters:  09/21/20 286 lb 6.4 oz (129.9  kg)  07/26/20 262 lb (118.8 kg)  06/22/20 260 lb (117.9 kg)    Physical Exam Constitutional:      General: He is not in acute distress.    Appearance: He is well-developed. He is obese.     Comments: NAD  HENT:     Mouth/Throat:     Mouth: Oropharynx is clear and moist.  Eyes:     Conjunctiva/sclera: Conjunctivae normal.     Pupils: Pupils are equal, round, and reactive to light.  Neck:     Thyroid: Blevins thyromegaly.     Vascular: Blevins JVD.  Cardiovascular:     Rate and Rhythm: Normal rate and regular rhythm.     Pulses: Intact  distal pulses.     Heart sounds: Normal heart sounds. Blevins murmur heard. Blevins friction rub. Blevins gallop.   Pulmonary:     Effort: Pulmonary effort is normal. Blevins respiratory distress.     Breath sounds: Normal breath sounds. Blevins wheezing or rales.  Chest:     Chest wall: Blevins tenderness.  Abdominal:     General: Bowel sounds are normal. There is Blevins distension.     Palpations: Abdomen is soft. There is Blevins mass.     Tenderness: There is Blevins abdominal tenderness. There is Blevins guarding or rebound.  Musculoskeletal:        General: Tenderness present. Normal range of motion.     Cervical back: Normal range of motion.     Right lower leg: Edema present.     Left lower leg: Edema present.  Lymphadenopathy:     Cervical: Blevins cervical adenopathy.  Skin:    General: Skin is warm and dry.     Findings: Rash present.  Neurological:     Mental Status: He is alert and oriented to person, place, and time.     Cranial Nerves: Blevins cranial nerve deficit.     Motor: Blevins abnormal muscle tone.     Coordination: He displays a negative Romberg sign. Coordination abnormal.     Gait: Gait abnormal.     Deep Tendon Reflexes: Reflexes are normal and symmetric.  Psychiatric:        Mood and Affect: Mood and affect normal.        Behavior: Behavior normal.        Thought Content: Thought content normal.        Judgment: Judgment normal.     Lab Results  Component Value Date   WBC 8.3 09/14/2020   HGB 13.4 09/14/2020   HCT 39.7 09/14/2020   PLT 105.0 (L) 09/14/2020   GLUCOSE 97 09/14/2020   CHOL 108 01/01/2018   TRIG 98 01/01/2018   HDL 35 (L) 01/01/2018   LDLCALC 53 01/01/2018   ALT 10 09/14/2020   AST 14 09/14/2020   NA 139 09/14/2020   K 5.0 09/14/2020   CL 102 09/14/2020   CREATININE 0.77 09/14/2020   BUN 17 09/14/2020   CO2 35 (H) 09/14/2020   TSH 0.80 02/17/2020   PSA 0.23 09/15/2011   INR 1.0 11/28/2017   HGBA1C 5.4 09/14/2020   MICROALBUR 0.7 03/29/2016    VAS US CAROTID  Result Date:  01/22/2020 Carotid Arterial Duplex Study Indications:       Left endarterectomy and bilateral carotid artery stenosis.                    Patient denies any cerebrovascular symptoms. Risk Factors:      Hypertension, hyperlipidemia, Diabetes, past history  of                    smoking, coronary artery disease. Other Factors:     CABG, COPD. Comparison Study:  In 01/2019, a carotid duplex performed at showed velocities                    of 119/36 cm/s in the RICA and 454/09 cm/s in the LICA. Performing Technologist: Sharlett Iles RVT  Examination Guidelines: A complete evaluation includes B-mode imaging, spectral Doppler, color Doppler, and power Doppler as needed of all accessible portions of each vessel. Bilateral testing is considered an integral part of a complete examination. Limited examinations for reoccurring indications may be performed as noted.  Right Carotid Findings: +----------+--------+--------+--------+--------------------------+--------+           PSV cm/sEDV cm/sStenosisPlaque Description        Comments +----------+--------+--------+--------+--------------------------+--------+ CCA Prox  193     15              heterogenous                       +----------+--------+--------+--------+--------------------------+--------+ CCA Mid   62      7               heterogenous and irregular         +----------+--------+--------+--------+--------------------------+--------+ CCA Distal81      9               hyperechoic                        +----------+--------+--------+--------+--------------------------+--------+ ICA Prox  100     18      1-39%   heterogenous and irregular         +----------+--------+--------+--------+--------------------------+--------+ ICA Mid   96      22                                                 +----------+--------+--------+--------+--------------------------+--------+ ICA Distal70      18                                                  +----------+--------+--------+--------+--------------------------+--------+ ECA       297     17      >50%    heterogenous and irregular         +----------+--------+--------+--------+--------------------------+--------+ +----------+--------+-------+---------+-------------------+           PSV cm/sEDV cmsDescribe Arm Pressure (mmHG) +----------+--------+-------+---------+-------------------+ Ezzard Standing            Turbulent156                 +----------+--------+-------+---------+-------------------+ +---------+--------+--+--------+-+---------+ VertebralPSV cm/s38EDV cm/s4Antegrade +---------+--------+--+--------+-+---------+ Velocities in the RICA remain within normal range and stable compared to the prior exam. Left Carotid Findings: +----------+--------+--------+--------+------------------+------------------+           PSV cm/sEDV cm/sStenosisPlaque DescriptionComments           +----------+--------+--------+--------+------------------+------------------+ CCA Prox  103     8                                                    +----------+--------+--------+--------+------------------+------------------+  CCA Mid   78      9               heterogenous                         +----------+--------+--------+--------+------------------+------------------+ CCA Distal56      9                                                    +----------+--------+--------+--------+------------------+------------------+ ICA Prox  68      13              heterogenous                         +----------+--------+--------+--------+------------------+------------------+ ICA Mid   76      17      1-39%                     s/p endarterectomy +----------+--------+--------+--------+------------------+------------------+ ICA Distal76      24                                                    +----------+--------+--------+--------+------------------+------------------+ ECA       286     13      >50%    heterogenous                         +----------+--------+--------+--------+------------------+------------------+ +----------+--------+--------+--------+-------------------+           PSV cm/sEDV cm/sDescribeArm Pressure (mmHG) +----------+--------+--------+--------+-------------------+ [id number]             Stenotic150                 +----------+--------+--------+--------+-------------------+ +---------+--------+--+--------+--+---------+ VertebralPSV cm/s64EDV cm/s10Antegrade +---------+--------+--+--------+--+---------+ Velocities in the LICA remain within normal range and stable compared to the prior exam, s/p endarterectomy.  Summary: Right Carotid: Velocities in the right ICA are consistent with a 1-39% stenosis.                Non-hemodynamically significant plaque <50% noted in the CCA.                The ECA appears >50% stenosed. Left Carotid: Velocities in the left ICA are consistent with a 1-39% stenosis.               Non-hemodynamically significant plaque <50% noted in the CCA.               The ECA appears >50% stenosed. Vertebrals:  Bilateral vertebral arteries demonstrate antegrade flow. Subclavians: Left subclavian artery was stenotic. Right subclavian artery flow              was disturbed. *See table(s) above for measurements and observations. Suggest follow up study in 12 months. Electronically signed by Ida Rogue MD on 01/22/2020 at 5:32:00 PM.    Final     Assessment & Plan:    Walker Kehr, MD singular

## 2020-09-21 NOTE — Assessment & Plan Note (Signed)
ANORO ELLIPTA daily Proventil as needed

## 2020-09-21 NOTE — Assessment & Plan Note (Signed)
Percocet prn Not to take w/Clonazepam  Potential benefits of a long term opioids use as well as potential risks (i.e. addiction risk, apnea etc) and complications (i.e. Somnolence, constipation and others) were explained to the patient and were aknowledged.

## 2020-09-21 NOTE — Assessment & Plan Note (Signed)
On Torsemide, Hydralazine

## 2020-09-21 NOTE — Assessment & Plan Note (Signed)
On diet Check A1c 

## 2020-12-10 NOTE — Telephone Encounter (Signed)
Check [place] registry last filled Clonazepam 11/13/20 & Oxycodone 11/09/20.Marland KitchenJohny Chess

## 2020-12-13 ENCOUNTER — Other Ambulatory Visit: Payer: Self-pay | Admitting: Internal Medicine

## 2020-12-13 MED ORDER — CLONAZEPAM 0.5 MG PO TABS: ORAL_TABLET | ORAL | 3 refills | Status: DC

## 2020-12-13 MED ORDER — OXYCODONE-ACETAMINOPHEN 5-325 MG PO TABS: 1.0000 | ORAL_TABLET | Freq: Four times a day (QID) | ORAL | 0 refills | Status: DC | PRN

## 2020-12-20 ENCOUNTER — Ambulatory Visit (INDEPENDENT_AMBULATORY_CARE_PROVIDER_SITE_OTHER): Payer: Medicare Other | Admitting: Internal Medicine

## 2020-12-20 ENCOUNTER — Other Ambulatory Visit: Payer: Self-pay

## 2020-12-20 ENCOUNTER — Encounter: Payer: Self-pay | Admitting: Internal Medicine

## 2020-12-20 DIAGNOSIS — I2583 Coronary atherosclerosis due to lipid rich plaque: Secondary | ICD-10-CM

## 2020-12-20 DIAGNOSIS — R609 Edema, unspecified: Secondary | ICD-10-CM

## 2020-12-20 DIAGNOSIS — M545 Low back pain, unspecified: Secondary | ICD-10-CM | POA: Diagnosis not present

## 2020-12-20 DIAGNOSIS — I2510 Atherosclerotic heart disease of native coronary artery without angina pectoris: Secondary | ICD-10-CM

## 2020-12-20 DIAGNOSIS — M79605 Pain in left leg: Secondary | ICD-10-CM

## 2020-12-20 DIAGNOSIS — I700 Atherosclerosis of aorta: Secondary | ICD-10-CM

## 2020-12-20 DIAGNOSIS — J449 Chronic obstructive pulmonary disease, unspecified: Secondary | ICD-10-CM | POA: Diagnosis not present

## 2020-12-20 DIAGNOSIS — F411 Generalized anxiety disorder: Secondary | ICD-10-CM | POA: Diagnosis not present

## 2020-12-20 DIAGNOSIS — M79604 Pain in right leg: Secondary | ICD-10-CM

## 2020-12-20 MED ORDER — POTASSIUM CHLORIDE CRYS ER 20 MEQ PO TBCR: 20.0000 meq | EXTENDED_RELEASE_TABLET | Freq: Every day | ORAL | 3 refills | Status: DC

## 2020-12-20 MED ORDER — ANORO ELLIPTA 62.5-25 MCG/INH IN AEPB: 1.0000 | INHALATION_SPRAY | Freq: Every day | RESPIRATORY_TRACT | 3 refills | Status: DC | PRN

## 2020-12-20 MED ORDER — OXYCODONE-ACETAMINOPHEN 5-325 MG PO TABS
1.0000 | ORAL_TABLET | Freq: Four times a day (QID) | ORAL | 0 refills | Status: DC | PRN
Start: 2020-12-20 — End: 2021-03-21

## 2020-12-20 MED ORDER — OXYCODONE-ACETAMINOPHEN 5-325 MG PO TABS: 1.0000 | ORAL_TABLET | Freq: Four times a day (QID) | ORAL | 0 refills | Status: DC | PRN

## 2020-12-20 MED ORDER — METOPROLOL SUCCINATE ER 50 MG PO TB24: 50.0000 mg | ORAL_TABLET | Freq: Every day | ORAL | 3 refills | Status: DC

## 2020-12-20 NOTE — Assessment & Plan Note (Addendum)
Worse Re-start Anoro

## 2020-12-20 NOTE — Progress Notes (Signed)
Subjective:  Patient ID: [name], male    DOB: 24-Jun-1956  Age: 65 y.o. MRN: [id number]  CC: Follow-up (3 MONTH F/U- Request refills on his [place] con, oxycodone and clonazepam)   HPI [name] presents for LBP, LE edema - worse, COPD f/u  Outpatient Medications Prior to Visit  Medication Sig Dispense Refill  . albuterol (PROVENTIL) (2.5 MG/3ML) 0.083% nebulizer solution TAKE 3 MLS (2.5 MG TOTAL) BY NEBULIZATION DAILY AS NEEDED FOR WHEEZING OR SHORTNESS OF BREATH. 75 mL 5  . ANORO ELLIPTA 62.5-25 MCG/INH AEPB Inhale 1 puff into the lungs daily as needed. For wheezing & SOB 180 each 3  . aspirin EC 81 MG tablet Take 81 mg by mouth every evening.     . clonazePAM (KLONOPIN) 0.5 MG tablet 1 po bid prn 60 tablet 3  . clopidogrel (PLAVIX) 75 MG tablet TAKE 1 TABLET BY MOUTH EVERY DAY WITH BREAKFAST 90 tablet 3  . gabapentin (NEURONTIN) 300 MG capsule Take 1 capsule (300 mg total) by mouth 3 (three) times daily. 90 capsule 3  . metolazone (ZAROXOLYN) 5 MG tablet TAKE 1 TABLET BY MOUTH EVERY DAY 30 tablet 11  . metoprolol succinate (TOPROL-XL) 50 MG 24 hr tablet TAKE 2 TABLETS(100MG  TOTAL)DAILY (Patient taking differently: Take by mouth daily.) 180 tablet 3  . oxyCODONE-acetaminophen (PERCOCET/ROXICET) 5-325 MG tablet Take 1 tablet by mouth every 6 (six) hours as needed for severe pain. 120 tablet 0  . oxymetazoline (AFRIN) 0.05 % nasal spray Place 1 spray into both nostrils daily as needed for congestion.    . simvastatin (ZOCOR) 20 MG tablet TAKE 1 TABLET AT BEDTIME 90 tablet 3  . torsemide (DEMADEX) 100 MG tablet Take 1 tablet (100 mg total) by mouth daily. 90 tablet 3  . potassium chloride SA (KLOR-CON) 20 MEQ tablet Take 1 tablet (20 mEq total) by mouth daily. 90 tablet 3  . nitroGLYCERIN (NITROSTAT) 0.4 MG SL tablet Place 1 tablet (0.4 mg total) under the tongue every 5 (five) minutes as needed for chest pain (Up to 3 doses). 25 tablet 1  . oxyCODONE-acetaminophen  (PERCOCET/ROXICET) 5-325 MG tablet Take 1 tablet by mouth every 6 (six) hours as needed for severe pain. 120 tablet 0   No facility-administered medications prior to visit.    ROS: Review of Systems  Constitutional: Positive for fatigue. Negative for appetite change and unexpected weight change.  HENT: Negative for congestion, nosebleeds, sneezing, sore throat and trouble swallowing.   Eyes: Negative for itching and visual disturbance.  Respiratory: Positive for shortness of breath. Negative for cough.   Cardiovascular: Positive for leg swelling. Negative for chest pain and palpitations.  Gastrointestinal: Negative for abdominal distention, blood in stool, diarrhea and nausea.  Genitourinary: Negative for frequency and hematuria.  Musculoskeletal: Positive for back pain and gait problem. Negative for joint swelling and neck pain.  Skin: Positive for color change. Negative for rash.  Neurological: Negative for dizziness, tremors, speech difficulty and weakness.  Psychiatric/Behavioral: Negative for agitation, dysphoric mood and sleep disturbance. The patient is nervous/anxious.     Objective:  BP (!) 142/70 (BP Location: Left Arm)   Pulse 68   Temp 98.9 F (37.2 C) (Oral)   Ht 6' (1.829 m)   Wt 273 lb 12.8 oz (124.2 kg)   SpO2 98%   BMI 37.13 kg/m   BP Readings from Last 3 Encounters:  12/20/20 (!) 142/70  09/21/20 (!) 142/70  07/26/20 128/82    Wt Readings from Last 3  Encounters:  12/20/20 273 lb 12.8 oz (124.2 kg)  09/21/20 286 lb 6.4 oz (129.9 kg)  07/26/20 262 lb (118.8 kg)    Physical Exam Constitutional:      General: He is not in acute distress.    Appearance: He is well-developed. He is obese.     Comments: NAD  Eyes:     Conjunctiva/sclera: Conjunctivae normal.     Pupils: Pupils are equal, round, and reactive to light.  Neck:     Thyroid: No thyromegaly.     Vascular: No JVD.  Cardiovascular:     Rate and Rhythm: Normal rate and regular rhythm.      Heart sounds: Normal heart sounds. No murmur heard. No friction rub. No gallop.   Pulmonary:     Effort: Pulmonary effort is normal. No respiratory distress.     Breath sounds: Wheezing present. No rales.  Chest:     Chest wall: No tenderness.  Abdominal:     General: Bowel sounds are normal. There is no distension.     Palpations: Abdomen is soft. There is no mass.     Tenderness: There is no abdominal tenderness. There is no guarding or rebound.  Musculoskeletal:        General: No tenderness. Normal range of motion.     Cervical back: Normal range of motion.     Right lower leg: Edema present.     Left lower leg: Edema present.  Lymphadenopathy:     Cervical: No cervical adenopathy.  Skin:    General: Skin is warm and dry.     Findings: Rash present.  Neurological:     Mental Status: He is alert and oriented to person, place, and time.     Cranial Nerves: No cranial nerve deficit.     Motor: No abnormal muscle tone.     Coordination: Coordination normal.     Gait: Gait normal.     Deep Tendon Reflexes: Reflexes are normal and symmetric.  Psychiatric:        Behavior: Behavior normal.        Thought Content: Thought content normal.        Judgment: Judgment normal.   LEs w/1+ edema and discoloration  Lab Results  Component Value Date   WBC 8.3 09/14/2020   HGB 13.4 09/14/2020   HCT 39.7 09/14/2020   PLT 105.0 (L) 09/14/2020   GLUCOSE 97 09/14/2020   CHOL 108 01/01/2018   TRIG 98 01/01/2018   HDL 35 (L) 01/01/2018   LDLCALC 53 01/01/2018   ALT 10 09/14/2020   AST 14 09/14/2020   NA 139 09/14/2020   K 5.0 09/14/2020   CL 102 09/14/2020   CREATININE 0.77 09/14/2020   BUN 17 09/14/2020   CO2 35 (H) 09/14/2020   TSH 0.80 02/17/2020   PSA 0.23 09/15/2011   INR 1.0 11/28/2017   HGBA1C 5.4 09/14/2020   MICROALBUR 0.7 03/29/2016    VAS US CAROTID  Result Date: 01/22/2020 Carotid Arterial Duplex Study Indications:       Left endarterectomy and bilateral carotid  artery stenosis.                    Patient denies any cerebrovascular symptoms. Risk Factors:      Hypertension, hyperlipidemia, Diabetes, past history of                    smoking, coronary artery disease. Other Factors:     CABG, COPD. Comparison Study:  In 01/2019, a carotid duplex performed at showed velocities                    of 119/36 cm/s in the RICA and 299/37 cm/s in the LICA. Performing Technologist: Sharlett Iles RVT  Examination Guidelines: A complete evaluation includes B-mode imaging, spectral Doppler, color Doppler, and power Doppler as needed of all accessible portions of each vessel. Bilateral testing is considered an integral part of a complete examination. Limited examinations for reoccurring indications may be performed as noted.  Right Carotid Findings: +----------+--------+--------+--------+--------------------------+--------+           PSV cm/sEDV cm/sStenosisPlaque Description        Comments +----------+--------+--------+--------+--------------------------+--------+ CCA Prox  193     15              heterogenous                       +----------+--------+--------+--------+--------------------------+--------+ CCA Mid   62      7               heterogenous and irregular         +----------+--------+--------+--------+--------------------------+--------+ CCA Distal81      9               hyperechoic                        +----------+--------+--------+--------+--------------------------+--------+ ICA Prox  100     18      1-39%   heterogenous and irregular         +----------+--------+--------+--------+--------------------------+--------+ ICA Mid   96      22                                                 +----------+--------+--------+--------+--------------------------+--------+ ICA Distal70      18                                                 +----------+--------+--------+--------+--------------------------+--------+ ECA       297      17      >50%    heterogenous and irregular         +----------+--------+--------+--------+--------------------------+--------+ +----------+--------+-------+---------+-------------------+           PSV cm/sEDV cmsDescribe Arm Pressure (mmHG) +----------+--------+-------+---------+-------------------+ Ezzard Standing            Turbulent156                 +----------+--------+-------+---------+-------------------+ +---------+--------+--+--------+-+---------+ VertebralPSV cm/s38EDV cm/s4Antegrade +---------+--------+--+--------+-+---------+ Velocities in the RICA remain within normal range and stable compared to the prior exam. Left Carotid Findings: +----------+--------+--------+--------+------------------+------------------+           PSV cm/sEDV cm/sStenosisPlaque DescriptionComments           +----------+--------+--------+--------+------------------+------------------+ CCA Prox  103     8                                                    +----------+--------+--------+--------+------------------+------------------+ CCA Mid   78      9  heterogenous                         +----------+--------+--------+--------+------------------+------------------+ CCA Distal56      9                                                    +----------+--------+--------+--------+------------------+------------------+ ICA Prox  68      13              heterogenous                         +----------+--------+--------+--------+------------------+------------------+ ICA Mid   76      17      1-39%                     s/p endarterectomy +----------+--------+--------+--------+------------------+------------------+ ICA Distal76      24                                                   +----------+--------+--------+--------+------------------+------------------+ ECA       286     13      >50%    heterogenous                          +----------+--------+--------+--------+------------------+------------------+ +----------+--------+--------+--------+-------------------+           PSV cm/sEDV cm/sDescribeArm Pressure (mmHG) +----------+--------+--------+--------+-------------------+ [id number]             Stenotic150                 +----------+--------+--------+--------+-------------------+ +---------+--------+--+--------+--+---------+ VertebralPSV cm/s64EDV cm/s10Antegrade +---------+--------+--+--------+--+---------+ Velocities in the LICA remain within normal range and stable compared to the prior exam, s/p endarterectomy.  Summary: Right Carotid: Velocities in the right ICA are consistent with a 1-39% stenosis.                Non-hemodynamically significant plaque <50% noted in the CCA.                The ECA appears >50% stenosed. Left Carotid: Velocities in the left ICA are consistent with a 1-39% stenosis.               Non-hemodynamically significant plaque <50% noted in the CCA.               The ECA appears >50% stenosed. Vertebrals:  Bilateral vertebral arteries demonstrate antegrade flow. Subclavians: Left subclavian artery was stenotic. Right subclavian artery flow              was disturbed. *See table(s) above for measurements and observations. Suggest follow up study in 12 months. Electronically signed by Ida Rogue MD on 01/22/2020 at 5:32:00 PM.    Final     Assessment & Plan:   There are no diagnoses linked to this encounter.   Meds ordered this encounter  Medications  . potassium chloride SA (KLOR-CON) 20 MEQ tablet    Sig: Take 1 tablet (20 mEq total) by mouth daily.    Dispense:  90 tablet    Refill:  3    Requesting 1 year supply     Follow-up: No  follow-ups on file.  Walker Kehr, MD

## 2020-12-20 NOTE — Assessment & Plan Note (Signed)
Clonazepam prn Not to take w/pain meds  Potential benefits of a long term benzodiazepines  use as well as potential risks  and complications were explained to the patient and were aknowledged. 

## 2020-12-20 NOTE — Assessment & Plan Note (Signed)
Worse Take Demadex and Metolazone prn

## 2020-12-20 NOTE — Assessment & Plan Note (Signed)
On Simvastatin 

## 2020-12-20 NOTE — Assessment & Plan Note (Signed)
Percocet prn Not to take w/Clonazepam  Potential benefits of a long term opioids use as well as potential risks (i.e. addiction risk, apnea etc) and complications (i.e. Somnolence, constipation and others) were explained to the patient and were aknowledged. 

## 2021-01-21 ENCOUNTER — Ambulatory Visit (HOSPITAL_COMMUNITY)
Admission: RE | Admit: 2021-01-21 | Discharge: 2021-01-21 | Disposition: A | Discharge status: Home / Self Care | Payer: Medicare Other | Source: Ambulatory Visit | Attending: Cardiology | Admitting: Cardiology

## 2021-01-21 ENCOUNTER — Other Ambulatory Visit: Payer: Self-pay

## 2021-01-21 ENCOUNTER — Other Ambulatory Visit (HOSPITAL_COMMUNITY): Payer: Self-pay | Admitting: Cardiovascular Disease

## 2021-01-21 DIAGNOSIS — I6523 Occlusion and stenosis of bilateral carotid arteries: Secondary | ICD-10-CM

## 2021-01-21 DIAGNOSIS — Z9889 Other specified postprocedural states: Secondary | ICD-10-CM

## 2021-03-21 ENCOUNTER — Other Ambulatory Visit: Payer: Self-pay

## 2021-03-21 ENCOUNTER — Encounter: Payer: Self-pay | Admitting: Internal Medicine

## 2021-03-21 ENCOUNTER — Ambulatory Visit (INDEPENDENT_AMBULATORY_CARE_PROVIDER_SITE_OTHER): Payer: Medicare Other | Admitting: Internal Medicine

## 2021-03-21 VITALS — BP 138/72 | HR 64 | Temp 98.4°F | Ht 72.0 in | Wt 261.8 lb

## 2021-03-21 DIAGNOSIS — E1151 Type 2 diabetes mellitus with diabetic peripheral angiopathy without gangrene: Secondary | ICD-10-CM

## 2021-03-21 DIAGNOSIS — I6523 Occlusion and stenosis of bilateral carotid arteries: Secondary | ICD-10-CM | POA: Diagnosis not present

## 2021-03-21 DIAGNOSIS — I10 Essential (primary) hypertension: Secondary | ICD-10-CM | POA: Diagnosis not present

## 2021-03-21 LAB — CBC WITH DIFFERENTIAL/PLATELET
Basophils Absolute: 0.1 10*3/uL (ref 0.0–0.1)
Basophils Relative: 1 % (ref 0.0–3.0)
Eosinophils Absolute: 0.1 10*3/uL (ref 0.0–0.7)
Eosinophils Relative: 2.6 % (ref 0.0–5.0)
HCT: 42.2 % (ref 39.0–52.0)
Hemoglobin: 14 g/dL (ref 13.0–17.0)
Lymphocytes Relative: 12.2 % (ref 12.0–46.0)
Lymphs Abs: 0.7 10*3/uL (ref 0.7–4.0)
MCHC: 33.2 g/dL (ref 30.0–36.0)
MCV: 101.1 fl — ABNORMAL HIGH (ref 78.0–100.0)
Monocytes Absolute: 0.7 10*3/uL (ref 0.1–1.0)
Monocytes Relative: 12.2 % — ABNORMAL HIGH (ref 3.0–12.0)
Neutro Abs: 4.1 10*3/uL (ref 1.4–7.7)
Neutrophils Relative %: 72 % (ref 43.0–77.0)
Platelets: 92 10*3/uL — ABNORMAL LOW (ref 150.0–400.0)
RBC: 4.17 Mil/uL — ABNORMAL LOW (ref 4.22–5.81)
RDW: 14.5 % (ref 11.5–15.5)
WBC: 5.7 10*3/uL (ref 4.0–10.5)

## 2021-03-21 LAB — COMPREHENSIVE METABOLIC PANEL
ALT: 15 U/L (ref 0–53)
AST: 21 U/L (ref 0–37)
Albumin: 4.5 g/dL (ref 3.5–5.2)
Alkaline Phosphatase: 81 U/L (ref 39–117)
BUN: 11 mg/dL (ref 6–23)
CO2: 35 mEq/L — ABNORMAL HIGH (ref 19–32)
Calcium: 9.9 mg/dL (ref 8.4–10.5)
Chloride: 100 mEq/L (ref 96–112)
Creatinine, Ser: 0.81 mg/dL (ref 0.40–1.50)
GFR: 91.51 mL/min (ref >60.00)
Glucose, Bld: 107 mg/dL — ABNORMAL HIGH (ref 70–99)
Potassium: 5.4 mEq/L — ABNORMAL HIGH (ref 3.5–5.1)
Sodium: 140 mEq/L (ref 135–145)
Total Bilirubin: 0.8 mg/dL (ref 0.2–1.2)
Total Protein: 7.4 g/dL (ref 6.0–8.3)

## 2021-03-21 MED ORDER — OXYCODONE-ACETAMINOPHEN 5-325 MG PO TABS: 1.0000 | ORAL_TABLET | Freq: Four times a day (QID) | ORAL | 0 refills | Status: DC | PRN

## 2021-03-21 MED ORDER — CLONAZEPAM 0.5 MG PO TABS: ORAL_TABLET | ORAL | 3 refills | Status: DC

## 2021-03-21 MED ORDER — POTASSIUM CHLORIDE CRYS ER 10 MEQ PO TBCR: 10.0000 meq | EXTENDED_RELEASE_TABLET | Freq: Every day | ORAL | 3 refills | Status: DC

## 2021-03-21 NOTE — Progress Notes (Signed)
Subjective:  Patient ID: Keyron Rosasco, male    DOB: June 22, 1956  Age: 65 y.o. MRN: [id number]  CC: Follow-up (3 month f/u)   HPI Muaz Aldridge presents for LBP, HTN, edema, anxiety Pt lost wt  Outpatient Medications Prior to Visit  Medication Sig Dispense Refill   albuterol (PROVENTIL) (2.5 MG/3ML) 0.083% nebulizer solution TAKE 3 MLS (2.5 MG TOTAL) BY NEBULIZATION DAILY AS NEEDED FOR WHEEZING OR SHORTNESS OF BREATH. 75 mL 5   ANORO ELLIPTA 62.5-25 MCG/INH AEPB Inhale 1 puff into the lungs daily as needed. For wheezing & SOB 180 each 3   aspirin EC 81 MG tablet Take 81 mg by mouth every evening.      clonazePAM (KLONOPIN) 0.5 MG tablet 1 po bid prn 60 tablet 3   clopidogrel (PLAVIX) 75 MG tablet TAKE 1 TABLET BY MOUTH EVERY DAY WITH BREAKFAST 90 tablet 3   gabapentin (NEURONTIN) 300 MG capsule Take 1 capsule (300 mg total) by mouth 3 (three) times daily. 90 capsule 3   metolazone (ZAROXOLYN) 5 MG tablet TAKE 1 TABLET BY MOUTH EVERY DAY 30 tablet 11   metoprolol succinate (TOPROL-XL) 50 MG 24 hr tablet Take 1 tablet (50 mg total) by mouth daily. Take with or immediately following a meal. 90 tablet 3   oxyCODONE-acetaminophen (PERCOCET/ROXICET) 5-325 MG tablet Take 1 tablet by mouth every 6 (six) hours as needed for severe pain. 120 tablet 0   oxymetazoline (AFRIN) 0.05 % nasal spray Place 1 spray into both nostrils daily as needed for congestion.     potassium chloride SA (KLOR-CON) 20 MEQ tablet Take 1 tablet (20 mEq total) by mouth daily. 90 tablet 3   simvastatin (ZOCOR) 20 MG tablet TAKE 1 TABLET AT BEDTIME 90 tablet 3   torsemide (DEMADEX) 100 MG tablet Take 1 tablet (100 mg total) by mouth daily. 90 tablet 3   oxyCODONE-acetaminophen (PERCOCET/ROXICET) 5-325 MG tablet Take 1 tablet by mouth every 6 (six) hours as needed for severe pain. 120 tablet 0   nitroGLYCERIN (NITROSTAT) 0.4 MG SL tablet Place 1 tablet (0.4 mg total) under the tongue every 5 (five) minutes as needed  for chest pain (Up to 3 doses). 25 tablet 1   No facility-administered medications prior to visit.    ROS: Review of Systems  Constitutional:  Positive for fatigue. Negative for appetite change and unexpected weight change.  HENT:  Negative for congestion, nosebleeds, sneezing, sore throat and trouble swallowing.   Eyes:  Negative for itching and visual disturbance.  Respiratory:  Negative for cough.   Cardiovascular:  Positive for leg swelling. Negative for chest pain and palpitations.  Gastrointestinal:  Negative for abdominal distention, blood in stool, diarrhea and nausea.  Genitourinary:  Negative for frequency and hematuria.  Musculoskeletal:  Positive for arthralgias and back pain. Negative for gait problem, joint swelling and neck pain.  Skin:  Positive for color change. Negative for rash.  Neurological:  Negative for dizziness, tremors, speech difficulty and weakness.  Psychiatric/Behavioral:  Negative for agitation, dysphoric mood and sleep disturbance. The patient is not nervous/anxious.    Objective:  BP 138/72 (BP Location: Left Arm)   Pulse 64   Temp 98.4 F (36.9 C) (Oral)   Ht 6' (1.829 m)   Wt 261 lb 12.8 oz (118.8 kg)   SpO2 96%   BMI 35.51 kg/m   BP Readings from Last 3 Encounters:  03/21/21 138/72  12/20/20 (!) 142/70  09/21/20 (!) 142/70    Wt Readings from  Last 3 Encounters:  03/21/21 261 lb 12.8 oz (118.8 kg)  12/20/20 273 lb 12.8 oz (124.2 kg)  09/21/20 286 lb 6.4 oz (129.9 kg)    Physical Exam Constitutional:      General: He is not in acute distress.    Appearance: He is well-developed. He is obese.     Comments: NAD  Eyes:     Conjunctiva/sclera: Conjunctivae normal.     Pupils: Pupils are equal, round, and reactive to light.  Neck:     Thyroid: No thyromegaly.     Vascular: No JVD.  Cardiovascular:     Rate and Rhythm: Normal rate and regular rhythm.     Heart sounds: Normal heart sounds. No murmur heard.   No friction rub. No  gallop.  Pulmonary:     Effort: Pulmonary effort is normal. No respiratory distress.     Breath sounds: Normal breath sounds. No wheezing or rales.  Chest:     Chest wall: No tenderness.  Abdominal:     General: Bowel sounds are normal. There is no distension.     Palpations: Abdomen is soft. There is no mass.     Tenderness: There is no abdominal tenderness. There is no guarding or rebound.  Musculoskeletal:        General: Tenderness present. Normal range of motion.     Cervical back: Normal range of motion.     Right lower leg: Edema present.     Left lower leg: Edema present.  Lymphadenopathy:     Cervical: No cervical adenopathy.  Skin:    General: Skin is warm and dry.     Findings: Rash present.  Neurological:     Mental Status: He is alert and oriented to person, place, and time.     Cranial Nerves: No cranial nerve deficit.     Motor: No abnormal muscle tone.     Coordination: Coordination normal.     Gait: Gait normal.     Deep Tendon Reflexes: Reflexes are normal and symmetric.  Psychiatric:        Behavior: Behavior normal.        Thought Content: Thought content normal.        Judgment: Judgment normal.   B edema 1+ Hyperpigmentation - forearms, shins  Lab Results  Component Value Date   WBC 8.3 09/14/2020   HGB 13.4 09/14/2020   HCT 39.7 09/14/2020   PLT 105.0 (L) 09/14/2020   GLUCOSE 97 09/14/2020   CHOL 108 01/01/2018   TRIG 98 01/01/2018   HDL 35 (L) 01/01/2018   LDLCALC 53 01/01/2018   ALT 10 09/14/2020   AST 14 09/14/2020   NA 139 09/14/2020   K 5.0 09/14/2020   CL 102 09/14/2020   CREATININE 0.77 09/14/2020   BUN 17 09/14/2020   CO2 35 (H) 09/14/2020   TSH 0.80 02/17/2020   PSA 0.23 09/15/2011   INR 1.0 11/28/2017   HGBA1C 5.4 09/14/2020   MICROALBUR 0.7 03/29/2016    VAS US CAROTID  Result Date: 01/22/2021 Carotid Arterial Duplex Study Patient Name:  UNNAMED DAZEY  Date of Exam:   01/21/2021 Medical Rec #: [id number]             Accession #:    [id number] Date of Birth: 07/06/1956            Patient Gender: M Patient Age:   64Y Exam Location:  [facility] Procedure:      VAS US CAROTID Referring Phys: [id number]  C NISHAN --------------------------------------------------------------------------------  Indications:       Carotid artery disease, left endarterectomy and patient                    denies any cerebrovascular symptoms today. Risk Factors:      Hypertension, hyperlipidemia, Diabetes, past history of                    smoking, coronary artery disease. Other Factors:     Left carotid endarterectomy 2010                     CABG                    COPD. Comparison Study:  Prior carotid duplex exam on 01/22/2020 showed highest                    velocities in right proximal ICA 100/18 cm/s and left mid ICA                    76/17 cm/s, s/p left CEA. Left subclavian artery 405 cm/s. Performing Technologist: Salvadore Dom RVT, RDCS (AE), RDMS  Examination Guidelines: A complete evaluation includes B-mode imaging, spectral Doppler, color Doppler, and power Doppler as needed of all accessible portions of each vessel. Bilateral testing is considered an integral part of a complete examination. Limited examinations for reoccurring indications may be performed as noted.  Right Carotid Findings: +----------+--------+--------+--------+--------------------+--------+           PSV cm/sEDV cm/sStenosisPlaque Description  Comments +----------+--------+--------+--------+--------------------+--------+ CCA Prox  71      13                                           +----------+--------+--------+--------+--------------------+--------+ CCA Mid   64      15              focal and calcific           +----------+--------+--------+--------+--------------------+--------+ CCA Distal58      15              diffuse and calcific         +----------+--------+--------+--------+--------------------+--------+ ICA Prox  103     31       1-39%   diffuse and calcific         +----------+--------+--------+--------+--------------------+--------+ ICA Mid   101     28                                           +----------+--------+--------+--------+--------------------+--------+ ICA Distal76      21                                           +----------+--------+--------+--------+--------------------+--------+ ECA       186     19      >50%    diffuse and calcific         +----------+--------+--------+--------+--------------------+--------+ +----------+--------+-------+----------------+-------------------+           PSV cm/sEDV cmsDescribe        Arm Pressure (mmHG) +----------+--------+-------+----------------+-------------------+ [id number]  Multiphasic, [id number]                 +----------+--------+-------+----------------+-------------------+ +---------+--------+--+--------+-+---------+ VertebralPSV cm/s35EDV cm/s8Antegrade +---------+--------+--+--------+-+---------+  Left Carotid Findings: +----------+--------+--------+--------+--------------------+-------------------+           PSV cm/sEDV cm/sStenosisPlaque Description  Comments            +----------+--------+--------+--------+--------------------+-------------------+ CCA Prox  136     17                                  spectral broadening                                                       noted               +----------+--------+--------+--------+--------------------+-------------------+ CCA Mid   56      13              diffuse and calcific                    +----------+--------+--------+--------+--------------------+-------------------+ CCA Distal54      13                                  s/p CEA             +----------+--------+--------+--------+--------------------+-------------------+ ICA Prox  50      18              focal and calcific  s/p CEA              +----------+--------+--------+--------+--------------------+-------------------+ ICA Mid   89      20      1-39%                                           +----------+--------+--------+--------+--------------------+-------------------+ ICA Distal74      27                                                      +----------+--------+--------+--------+--------------------+-------------------+ ECA       317     21      >50%                                            +----------+--------+--------+--------+--------------------+-------------------+ +----------+--------+--------+--------+-------------------+           PSV cm/sEDV cm/sDescribeArm Pressure (mmHG) +----------+--------+--------+--------+-------------------+ [id number]             [id number]                 +----------+--------+--------+--------+-------------------+ +---------+--------+--+--------+--+---------+ VertebralPSV cm/s55EDV cm/s14Antegrade +---------+--------+--+--------+--+---------+   Summary: Right Carotid: Velocities in the right ICA are consistent with a stable 1-39%                stenosis. Non-hemodynamically significant plaque <50% noted in  the CCA. The ECA appears >50% stenosed. Left Carotid: Velocities in the left ICA are consistent with a stable 1-39%               stenosis. Non-hemodynamically significant plaque <50% noted in the               CCA. The ECA appears >50% stenosed. S/p CEA. Vertebrals:  Bilateral vertebral arteries demonstrate antegrade flow. Subclavians: Left subclavian artery was stenotic. Normal flow hemodynamics were              seen in the right subclavian artery. *See table(s) above for measurements and observations. Suggest follow up study in 12 months. Electronically signed by Larae Grooms MD on 01/22/2021 at 8:35:49 PM.    Final     Assessment & Plan:     Walker Kehr, MD

## 2021-03-28 ENCOUNTER — Other Ambulatory Visit: Payer: Self-pay

## 2021-03-28 NOTE — Progress Notes (Signed)
Per Dr. Alain Marion, d/c Potassium. Will update medication list.

## 2021-04-21 ENCOUNTER — Telehealth: Payer: Self-pay | Admitting: *Deleted

## 2021-04-21 NOTE — Chronic Care Management (AMB) (Signed)
  Chronic Care Management   Note  04/21/2021 Name: [name] MRN: [id number] DOB: May 22, 1956  [name] is a 65 y.o. year old male who is a primary care patient of Plotnikov, [name], MD. I reached out to [name] by phone today in response to a referral sent by [name] PCP, Plotnikov, [name], MD.      [name] was given information about Chronic Care Management services today including:  CCM service includes personalized support from designated clinical staff supervised by his physician, including individualized plan of care and coordination with other care providers 24/7 contact phone numbers for assistance for urgent and routine care needs. Service will only be billed when office clinical staff spend 20 minutes or more in a month to coordinate care. Only one practitioner may furnish and bill the service in a calendar month. The patient may stop CCM services at any time (effective at the end of the month) by phone call to the office staff. The patient will be responsible for cost sharing (co-pay) of up to 20% of the service fee (after annual deductible is met).  Patient agreed to services and verbal consent obtained.   Follow up plan: Telephone appointment with care management team member scheduled for:05/06/21  [facility]  Direct Dial: [phone]

## 2021-04-26 ENCOUNTER — Ambulatory Visit: Payer: Medicare Other | Admitting: Hematology and Oncology

## 2021-04-26 ENCOUNTER — Other Ambulatory Visit: Payer: Medicare Other

## 2021-05-06 ENCOUNTER — Ambulatory Visit (INDEPENDENT_AMBULATORY_CARE_PROVIDER_SITE_OTHER): Payer: Medicare Other | Admitting: *Deleted

## 2021-05-06 DIAGNOSIS — I10 Essential (primary) hypertension: Secondary | ICD-10-CM

## 2021-05-06 DIAGNOSIS — E785 Hyperlipidemia, unspecified: Secondary | ICD-10-CM

## 2021-05-06 NOTE — Patient Instructions (Signed)
Visit [name], it was nice talking with you today.    I look forward to talking to you again for an update on Monday, October 31, 2021 at 9:00 am- please be listening out for my call that day.  I will call as close to 9:00 am as possible.   If you need to cancel or re-schedule our telephone visit, please call [phone] and one of our care guides will be happy to assist you.   I look forward to hearing about your progress.   Please don't hesitate to contact me if I can be of assistance to you before our next scheduled telephone appointment.   Scott Rack, RN, BSN, [facility] RN Care Coordination- [place] [phone]: direct office [phone]: mobile   PATIENT GOALS:   Goals Addressed             This Visit's Progress    Patient Self-Care Activities   On track    Timeframe:  Long-Range Goal Priority:  Medium Start Date:         05/06/21                    Expected End Date:     05/06/22                  Patient will self administer medications as prescribed as evidenced by self report/primary caregiver report  Patient will attend all scheduled provider appointments as evidenced by clinician review of documented attendance to scheduled appointments and patient/caregiver report Patient will call pharmacy for medication refills as evidenced by patient report and review of pharmacy fill history as appropriate Patient will call provider office for new concerns or questions as evidenced by review of documented incoming telephone call notes and patient report Patient will continue to follow a heart healthy, low salt, low cholesterol diet Patient will stay as active as possible around chronic pain considerations         Heart-Healthy Eating Plan Heart-healthy meal planning includes: Eating less unhealthy fats. Eating more healthy fats. Making other changes in your diet. Talk with your doctor or a diet specialist (dietitian) to  create an eating plan that is right for you. What is my plan? Your doctor may recommend an eating plan that includes: Total fat: ______% or less of total calories a day. Saturated fat: ______% or less of total calories a day. Cholesterol: less than _________mg a day. What are tips for following this plan? Cooking Avoid frying your food. Try to bake, boil, grill, or broil it instead. You can also reduce fat by: Removing the skin from poultry. Removing all visible fats from meats. Steaming vegetables in water or broth. Meal planning  At meals, divide your plate into four equal parts: Fill one-half of your plate with vegetables and green salads. Fill one-fourth of your plate with whole grains. Fill one-fourth of your plate with lean protein foods. Eat 4-5 servings of vegetables per day. A serving of vegetables is: 1 cup of raw or cooked vegetables. 2 cups of raw leafy greens. Eat 4-5 servings of fruit per day. A serving of fruit is: 1 medium whole fruit.  cup of dried fruit.  cup of fresh, frozen, or canned fruit.  cup of 100% fruit juice. Eat more foods that have soluble fiber. These are apples, broccoli, carrots, beans, peas, and barley. Try to get 20-30 g of fiber per day. Eat 4-5 servings of nuts, legumes,  and seeds per week: 1 serving of dried beans or legumes equals  cup after being cooked. 1 serving of nuts is  cup. 1 serving of seeds equals 1 tablespoon. General information Eat more home-cooked food. Eat less restaurant, buffet, and fast food. Limit or avoid alcohol. Limit foods that are high in starch and sugar. Avoid fried foods. Lose weight if you are overweight. Keep track of how much salt (sodium) you eat. This is important if you have high blood pressure. Ask your doctor to tell you more about this. Try to add vegetarian meals each week. Fats Choose healthy fats. These include olive oil and canola oil, flaxseeds, walnuts, almonds, and seeds. Eat more omega-3  fats. These include salmon, mackerel, sardines, tuna, flaxseed oil, and ground flaxseeds. Try to eat fish at least 2 times each week. Check food labels. Avoid foods with trans fats or high amounts of saturated fat. Limit saturated fats. These are often found in animal products, such as meats, butter, and cream. These are also found in plant foods, such as palm oil, palm kernel oil, and coconut oil. Avoid foods with partially hydrogenated oils in them. These have trans fats. Examples are stick margarine, some tub margarines, cookies, crackers, and other baked goods. What foods can I eat? Fruits All fresh, canned (in natural juice), or frozen fruits. Vegetables Fresh or frozen vegetables (raw, steamed, roasted, or grilled). Green salads. Grains Most grains. Choose whole wheat and whole grains most of the time. Rice and pasta, including brown rice and pastas made with whole wheat. Meats and other proteins Lean, well-trimmed beef, veal, pork, and lamb. Chicken and [place] without skin. All fish and shellfish. Wild duck, rabbit, pheasant, and venison. Egg whites or low-cholesterol egg substitutes. Dried beans, peas, lentils, and tofu. Seeds and most nuts. Dairy Low-fat or nonfat cheeses, including ricotta and mozzarella. Skim or 1% milk that is liquid, powdered, or evaporated. Buttermilk that is made with low-fat milk. Nonfat or low-fat yogurt. Fats and oils Non-hydrogenated (trans-free) margarines. Vegetable oils, including soybean, sesame, sunflower, olive, peanut, safflower, corn, canola, and cottonseed. Salad dressings or mayonnaise made with a vegetable oil. Beverages Mineral water. Coffee and tea. Diet carbonated beverages. Sweets and desserts Sherbet, gelatin, and fruit ice. Small amounts of dark chocolate. Limit all sweets and desserts. Seasonings and condiments All seasonings and condiments. The items listed above may not be a complete list of foods and drinks you can eat. Contact a  dietitian for more options. What foods should I avoid? Fruits Canned fruit in heavy syrup. Fruit in cream or butter sauce. Fried fruit. Limit coconut. Vegetables Vegetables cooked in cheese, cream, or butter sauce. Fried vegetables. Grains Breads that are made with saturated or trans fats, oils, or whole milk. Croissants. Sweet rolls. Donuts. High-fat crackers, such as cheese crackers. Meats and other proteins Fatty meats, such as hot dogs, ribs, sausage, bacon, rib-eye roast or steak. High-fat deli meats, such as salami and bologna. Caviar. Domestic duck and goose. Organ meats, such as liver. Dairy Cream, sour cream, cream cheese, and creamed cottage cheese. Whole-milk cheeses. Whole or 2% milk that is liquid, evaporated, or condensed. Whole buttermilk. Cream sauce or high-fat cheese sauce. Yogurt that is made from whole milk. Fats and oils Meat fat, or shortening. Cocoa butter, hydrogenated oils, palm oil, coconut oil, palm kernel oil. Solid fats and shortenings, including bacon fat, salt pork, lard, and butter. Nondairy cream substitutes. Salad dressings with cheese or sour cream. Beverages Regular sodas and juice drinks with added  sugar. Sweets and desserts Frosting. Pudding. Cookies. Cakes. Pies. Milk chocolate or white chocolate. Buttered syrups. Full-fat ice cream or ice cream drinks. The items listed above may not be a complete list of foods and drinks to avoid. Contact a dietitian for more information. Summary Heart-healthy meal planning includes eating less unhealthy fats, eating more healthy fats, and making other changes in your diet. Eat a balanced diet. This includes fruits and vegetables, low-fat or nonfat dairy, lean protein, nuts and legumes, whole grains, and heart-healthy oils and fats. This information is not intended to replace advice given to you by your health care provider. Make sure you discuss any questions you have with your health care provider. Document Revised:  12/09/2020 Document Reviewed: 12/09/2020 Elsevier Patient Education  [place].  Cholesterol Content in Foods Cholesterol is a waxy, fat-like substance that helps to carry fat in the blood. The body needs cholesterol in small amounts, but too much cholesterol can cause damage to the arteries and heart. Most people should eat less than 200 milligrams (mg) of cholesterol a day. Foods with cholesterol Cholesterol is found in animal-based foods, such as meat, seafood, and dairy. Generally, low-fat dairy and lean meats have less cholesterol than full-fat dairy and fatty meats. The milligrams of cholesterol per serving (mg per serving) of common cholesterol-containing foods are listed below. Meat and other proteins Egg -- one large whole egg has 186 mg. Veal shank -- 4 oz has 141 mg. Lean ground [place] (93% lean) -- 4 oz has 118 mg. Fat-trimmed lamb loin -- 4 oz has 106 mg. Lean ground beef (90% lean) -- 4 oz has 100 mg. Lobster -- 3.5 oz has 90 mg. Pork loin chops -- 4 oz has 86 mg. Canned salmon -- 3.5 oz has 83 mg. Fat-trimmed beef top loin -- 4 oz has 78 mg. Frankfurter -- 1 frank (3.5 oz) has 77 mg. Crab -- 3.5 oz has 71 mg. Roasted chicken without skin, white meat -- 4 oz has 66 mg. Light bologna -- 2 oz has 45 mg. Deli-cut [place] -- 2 oz has 31 mg. Canned tuna -- 3.5 oz has 31 mg. [place] Salines -- 1 oz has 29 mg. Oysters and mussels (raw) -- 3.5 oz has 25 mg. Mackerel -- 1 oz has 22 mg. Trout -- 1 oz has 20 mg. Pork sausage -- 1 link (1 oz) has 17 mg. Salmon -- 1 oz has 16 mg. Tilapia -- 1 oz has 14 mg. Dairy Soft-serve ice cream --  cup (4 oz) has 103 mg. Whole-milk yogurt -- 1 cup (8 oz) has 29 mg. Cheddar cheese -- 1 oz has 28 mg. American cheese -- 1 oz has 28 mg. Whole milk -- 1 cup (8 oz) has 23 mg. 2% milk -- 1 cup (8 oz) has 18 mg. Cream cheese -- 1 tablespoon (Tbsp) has 15 mg. Cottage cheese --  cup (4 oz) has 14 mg. Low-fat (1%) milk -- 1 cup (8 oz) has 10  mg. Sour cream -- 1 Tbsp has 8.5 mg. Low-fat yogurt -- 1 cup (8 oz) has 8 mg. Nonfat Greek yogurt -- 1 cup (8 oz) has 7 mg. Half-and-half cream -- 1 Tbsp has 5 mg. Fats and oils Cod liver oil -- 1 tablespoon (Tbsp) has 82 mg. Butter -- 1 Tbsp has 15 mg. Lard -- 1 Tbsp has 14 mg. Bacon grease -- 1 Tbsp has 14 mg. Mayonnaise -- 1 Tbsp has 5-10 mg. Margarine -- 1 Tbsp has 3-10 mg. Exact amounts  of cholesterol in these foods may vary depending on specific ingredients and brands. Foods without cholesterol Most plant-based foods do not have cholesterol unless you combine them with a food that has cholesterol. Foods without cholesterol include: Grains and cereals. Vegetables. Fruits. Vegetable oils, such as olive, canola, and sunflower oil. Legumes, such as peas, beans, and lentils. Nuts and seeds. Egg whites. Summary The body needs cholesterol in small amounts, but too much cholesterol can cause damage to the arteries and heart. Most people should eat less than 200 milligrams (mg) of cholesterol a day. This information is not intended to replace advice given to you by your health care provider. Make sure you discuss any questions you have with your health care provider. Document Revised: 11/11/2019 Document Reviewed: 12/22/2019 Elsevier Patient Education  2022 [employer].   Consent to CCM Services: Mr. Erney was given information about Chronic Care Management services including:  CCM service includes personalized support from designated clinical staff supervised by his physician, including individualized plan of care and coordination with other care providers 24/7 contact phone numbers for assistance for urgent and routine care needs. Service will only be billed when office clinical staff spend 20 minutes or more in a month to coordinate care. Only one practitioner may furnish and bill the service in a calendar month. The patient may stop CCM services at any time (effective at the  end of the month) by phone call to the office staff. The patient will be responsible for cost sharing (co-pay) of up to 20% of the service fee (after annual deductible is met).  Patient agreed to services and verbal consent obtained.   Patient verbalizes understanding of instructions provided today and agrees to view in [facility] Telephone follow up appointment with care management team member scheduled for:  Monday, October 31, 2021 at 9:00 am The patient has been provided with contact information for the care management team and has been advised to call with any health related questions or concerns  Scott Rack, RN, BSN, [place] [phone]: direct office [phone]: mobile    CLINICAL CARE PLAN: Patient Care Plan: RN Care Manager Plan of Care     Problem Identified: Chronic Disease Management Needs   Priority: Medium     Long-Range Goal: Development of plan of care for long term chronic disease management   Start Date: 05/06/2021  Expected End Date: 05/06/2022  Priority: Medium  Note:   Current Barriers:  Chronic Disease Management support and education needs related to HTN and HLD Chronic pain- limits ability to exercise/ stay active  RNCM Clinical Goal(s):  Patient will demonstrate ongoing health management independence for HTN/ HLD/ CAD  through collaboration with RN Care manager, provider, and care team.   Interventions: 1:1 collaboration with primary care provider regarding development and update of comprehensive plan of care as evidenced by provider attestation and co-signature Inter-disciplinary care team collaboration (see longitudinal plan of care) Evaluation of current treatment plan related to  self management and patient's adherence to plan as established by provider  Hyperlipidemia:  (Status: New goal.) Lab Results  Component Value Date   CHOL 108 01/01/2018   HDL 35 (L) 01/01/2018    [place] 53 01/01/2018   TRIG 98 01/01/2018   CHOLHDL 3.1 01/01/2018    Medication review performed; medication list updated in electronic medical record.  Provider established cholesterol goals reviewed; Counseled on importance of regular laboratory monitoring as prescribed; Provided HLD educational materials;  Reviewed role and benefits of statin for ASCVD risk reduction; Reviewed importance of limiting foods high in cholesterol; Screening for signs and symptoms of depression related to chronic disease state;  Assessed social determinant of health barriers;   Hypertension: (Status: New goal.) Last practice recorded BP readings:  BP Readings from Last 3 Encounters:  03/21/21 138/72  12/20/20 (!) 142/70  09/21/20 (!) 142/70  Most recent eGFR/CrCl:  Lab Results  Component Value Date   EGFR >90 04/19/2017    No components found for: CRCL  Evaluation of current treatment plan related to hypertension self management and patient's adherence to plan as established by provider;   Reviewed prescribed diet low salt, low cholesterol/ heart healthy Reviewed medications with patient and discussed importance of compliance;  Discussed plans with patient for ongoing care management follow up and provided patient with direct contact information for care management team; Discussed complications of poorly controlled blood pressure such as heart disease, stroke, circulatory complications, vision complications, kidney impairment, sexual dysfunction;  Confirmed patient is independent in ADL's/ iADL's, medication management at home: no concerns or discrepancies noted form medication review; medication list in EHR updated according to patient report today Reviewed upcoming provider appointments with patient; confirmed he has plans to attend all as scheduled: 05/10/21- oncology surveillance annual visit ( history of lymphoma); 06/21/21- PCP Confirmed patient does not monitor/ record daily weights at home-  states "never been told to;" discussed use of diuretic therapy for fluid management; patient verbalizes good understanding of same Discussed and provided education around importance of staying as active as possible in setting of HTN/ HLD/ CAD: patient limited due to chronic pain post- immunotherapy for lymphoma complications  Patient Goals/Self-Care Activities: Patient will self administer medications as prescribed as evidenced by self report/primary caregiver report  Patient will attend all scheduled provider appointments as evidenced by clinician review of documented attendance to scheduled appointments and patient/caregiver report Patient will call pharmacy for medication refills as evidenced by patient report and review of pharmacy fill history as appropriate Patient will call provider office for new concerns or questions as evidenced by review of documented incoming telephone call notes and patient report Patient will continue to follow a heart healthy, low salt, low cholesterol diet Patient will stay as active as possible around chronic pain considerations

## 2021-05-06 NOTE — Chronic Care Management (AMB) (Addendum)
Chronic Care Management   CCM RN Visit Note  05/06/2021 Name: [name] MRN: [id number] DOB: 11/02/1955  Subjective: [name] is a 65 y.o. year old male who is a primary care patient of Plotnikov, Aleksei V, MD. The care management team was consulted for assistance with disease management and care coordination needs.    Engaged with patient by telephone for follow up visit in response to provider referral for case management and/or care coordination services.   Consent to Services:  The patient was given information about Chronic Care Management services, agreed to services, and gave verbal consent 04/21/21 prior to initiation of services.  Please see initial visit note for detailed documentation.   Patient agreed to services and verbal consent obtained.   Assessment: Review of patient past medical history, allergies, medications, health status, including review of consultants reports, laboratory and other test data, was performed as part of comprehensive evaluation and provision of chronic care management services.   SDOH (Social Determinants of Health) assessments and interventions performed:  SDOH Interventions    Flowsheet Row Most Recent Value  SDOH Interventions   Food Insecurity Interventions Intervention Not Indicated  Housing Interventions Intervention Not Indicated  Transportation Interventions Intervention Not Indicated  [Patient drives self]             CCM Care Plan Allergies  Allergen Reactions   Bendamustine Hcl Rash    See hospital notes from 01/12/15   Heparin Other (See Comments)    Opposite reaction Heparin induced thrombocytopenia Other reaction(s): Heparin-Induced Thrombocytopenia   Tape Rash   Outpatient Encounter Medications as of 05/06/2021  Medication Sig Note   albuterol (PROVENTIL) (2.5 MG/3ML) 0.083% nebulizer solution TAKE 3 MLS (2.5 MG TOTAL) BY NEBULIZATION DAILY AS NEEDED FOR WHEEZING OR SHORTNESS OF BREATH. 05/06/2021: 05/06/21:  Patient reports not using much- reports uses rarely   ANORO ELLIPTA 62.5-25 MCG/INH AEPB Inhale 1 puff into the lungs daily as needed. For wheezing & SOB    aspirin EC 81 MG tablet Take 81 mg by mouth every evening.  12/23/2014: .   clonazePAM (KLONOPIN) 0.5 MG tablet 1 po bid prn    clopidogrel (PLAVIX) 75 MG tablet TAKE 1 TABLET BY MOUTH EVERY DAY WITH BREAKFAST    metoprolol succinate (TOPROL-XL) 50 MG 24 hr tablet Take 1 tablet (50 mg total) by mouth daily. Take with or immediately following a meal.    oxyCODONE-acetaminophen (PERCOCET/ROXICET) 5-325 MG tablet Take 1 tablet by mouth every 6 (six) hours as needed for severe pain.    oxyCODONE-acetaminophen (PERCOCET/ROXICET) 5-325 MG tablet Take 1 tablet by mouth every 6 (six) hours as needed for severe pain.    oxymetazoline (AFRIN) 0.05 % nasal spray Place 1 spray into both nostrils daily as needed for congestion.    simvastatin (ZOCOR) 20 MG tablet TAKE 1 TABLET AT BEDTIME    torsemide (DEMADEX) 100 MG tablet Take 1 tablet (100 mg total) by mouth daily. 05/06/2021: 05/06/21: Patient reports taking 2-4 times per week   gabapentin (NEURONTIN) 300 MG capsule Take 1 capsule (300 mg total) by mouth 3 (three) times daily. (Patient not taking: Reported on 05/06/2021)    metolazone (ZAROXOLYN) 5 MG tablet TAKE 1 TABLET BY MOUTH EVERY DAY (Patient not taking: Reported on 05/06/2021) 05/06/2021: 05/06/21: Reports no longer taking every day   nitroGLYCERIN (NITROSTAT) 0.4 MG SL tablet Place 1 tablet (0.4 mg total) under the tongue every 5 (five) minutes as needed for chest pain (Up to 3 doses).      No facility-administered encounter medications on file as of 05/06/2021.   Patient Active Problem List   Diagnosis Date Noted   Aortic atherosclerosis ([place]) 12/20/2020   Peripheral neuropathy 06/22/2020   Leukocytosis 04/27/2020   Weight loss 02/17/2020   CAD (coronary artery disease) 12/14/2017   CAD S/P percutaneous coronary angioplasty 12/08/2017   Unstable  angina (HCC)    Acute respiratory failure with hypoxia ([place]) 11/15/2017   COPD with acute exacerbation ([place]) 11/15/2017   Port catheter in place 01/12/2017   Skin lesion of back 04/28/2016   Low back pain radiating to both legs 09/28/2015   Boil of upper extremity 08/20/2015   Bilateral leg ulcer ([place]) 06/25/2015   URI (upper respiratory infection) 04/26/2015   Rash 04/26/2015   Basal cell carcinoma of back 04/08/2015   Well adult exam 04/01/2015   Cellulitis of hand, left 02/11/2015   Vasculitis ([place])    Cellulitis of right lower extremity    Erythroderma    Purpura ([place])    RMS[facility] spotted fever)    Chronic diastolic CHF (congestive heart failure) (HCC)    Thrombocytopenia (HCC)    Hx of CABG    Bilateral lower extremity edema 01/12/2015   Hypersensitivity reaction 01/02/2015   Asymptomatic hyperuricemia 83/72/9021   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   Mass of neck 12/11/2014    Class: Chronic   Morbid obesity ([place]) 12/11/2014   Diabetes mellitus type 2, controlled ([place]) 12/11/2014   Depression 12/11/2014   Neoplasm of uncertain behavior of skin 09/25/2014   Neck mass 07/28/2014   Insomnia 03/25/2014   Erectile dysfunction 09/22/2013   Chronic venous insufficiency 02/25/2013   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 ([place]) 02/25/2013   Anxiety state 02/25/2013   Murmur 09/03/2012   Tobacco abuse 12/02/2010   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   G E R D 06/29/2009   ENLARGEMENT OF LYMPH NODES 06/29/2009   HEPARIN-INDUCED THROMBOCYTOPENIA 02/11/2009   Edema 02/11/2009   Carotid disease, bilateral ([place]) 02/11/2009   Elevated lipids 05/23/2007   Essential hypertension 05/23/2007   Sleep apnea 05/23/2007   Conditions to be addressed/monitored:  CAD, HTN, and HLD  Care Plan : RN Care Manager Plan of Care  Updates made by Knox Royalty, RN since 05/06/2021 12:00 AM     Problem: Chronic Disease Management Needs   Priority: Medium      Long-Range Goal: Development of plan of care for long term chronic disease management   Start Date: 05/06/2021  Expected End Date: 05/06/2022  Priority: Medium  Note:   Current Barriers:  Chronic Disease Management support and education needs related to HTN and HLD Chronic pain- limits ability to exercise/ stay active  RNCM Clinical Goal(s):  Patient will demonstrate ongoing health management independence for HTN/ HLD/ CAD  through collaboration with RN Care manager, provider, and care team.   Interventions: 1:1 collaboration with primary care provider regarding development and update of comprehensive plan of care as evidenced by provider attestation and co-signature Inter-disciplinary care team collaboration (see longitudinal plan of care) Evaluation of current treatment plan related to  self management and patient's adherence to plan as established by provider  Hyperlipidemia:  (Status: New goal.) Lab Results  Component Value Date   CHOL 108 01/01/2018   HDL 35 (L) 01/01/2018   [place] 53 01/01/2018   TRIG 98 01/01/2018   CHOLHDL 3.1 01/01/2018    Medication review performed; medication list updated in electronic medical record.  Provider established cholesterol  goals reviewed; Counseled on importance of regular laboratory monitoring as prescribed; Provided HLD educational materials; Reviewed role and benefits of statin for ASCVD risk reduction; Reviewed importance of limiting foods high in cholesterol; Screening for signs and symptoms of depression related to chronic disease state;  Assessed social determinant of health barriers;   Hypertension: (Status: New goal.) Last practice recorded BP readings:  BP Readings from Last 3 Encounters:  03/21/21 138/72  12/20/20 (!) 142/70  09/21/20 (!) 142/70  Most recent eGFR/CrCl:  Lab Results  Component Value Date   EGFR >90 04/19/2017    No components found for: CRCL  Evaluation of current treatment plan related to  hypertension self management and patient's adherence to plan as established by provider;   Reviewed prescribed diet low salt, low cholesterol/ heart healthy Reviewed medications with patient and discussed importance of compliance;  Discussed plans with patient for ongoing care management follow up and provided patient with direct contact information for care management team; Discussed complications of poorly controlled blood pressure such as heart disease, stroke, circulatory complications, vision complications, kidney impairment, sexual dysfunction;  Confirmed patient is independent in ADL's/ iADL's, medication management at home: no concerns or discrepancies noted form medication review; medication list in EHR updated according to patient report today Reviewed upcoming provider appointments with patient; confirmed he has plans to attend all as scheduled: 05/10/21- oncology surveillance annual visit ( history of lymphoma); 06/21/21- PCP Confirmed patient does not monitor/ record daily weights at home- states "never been told to;" discussed use of diuretic therapy for fluid management; patient verbalizes good understanding of same Discussed and provided education around importance of staying as active as possible in setting of HTN/ HLD/ CAD: patient limited due to chronic pain post- immunotherapy for lymphoma complications  Patient Goals/Self-Care Activities: Patient will self administer medications as prescribed as evidenced by self report/primary caregiver report  Patient will attend all scheduled provider appointments as evidenced by clinician review of documented attendance to scheduled appointments and patient/caregiver report Patient will call pharmacy for medication refills as evidenced by patient report and review of pharmacy fill history as appropriate Patient will call provider office for new concerns or questions as evidenced by review of documented incoming telephone call notes and patient  report Patient will continue to follow a heart healthy, low salt, low cholesterol diet Patient will stay as active as possible around chronic pain considerations      Plan: Telephone follow up appointment with care management team member scheduled for:  Monday, October 31, 2021 at 9:00 am The patient has been provided with contact information for the care management team and has been advised to call with any health related questions or concerns.    Mckinney , RN, BSN, CCRN Alumnus CCM Clinic RN Care Coordination- [facility] [phone]: direct office [phone]: mobile    Medical screening examination/treatment/procedure(s) were performed by non-physician practitioner and as supervising physician I was immediately available for consultation/collaboration.  I agree with above. Aleksei Plotnikov, MD        

## 2021-05-10 ENCOUNTER — Encounter: Payer: Self-pay | Admitting: Hematology and Oncology

## 2021-05-10 ENCOUNTER — Telehealth: Payer: Self-pay

## 2021-05-10 ENCOUNTER — Other Ambulatory Visit: Payer: Self-pay

## 2021-05-10 ENCOUNTER — Inpatient Hospital Stay: Payer: Medicare Other | Attending: Hematology and Oncology

## 2021-05-10 ENCOUNTER — Inpatient Hospital Stay (HOSPITAL_BASED_OUTPATIENT_CLINIC_OR_DEPARTMENT_OTHER): Payer: Medicare Other | Admitting: Hematology and Oncology

## 2021-05-10 DIAGNOSIS — C8228 Follicular lymphoma grade III, unspecified, lymph nodes of multiple sites: Secondary | ICD-10-CM | POA: Diagnosis not present

## 2021-05-10 DIAGNOSIS — D696 Thrombocytopenia, unspecified: Secondary | ICD-10-CM | POA: Insufficient documentation

## 2021-05-10 DIAGNOSIS — J449 Chronic obstructive pulmonary disease, unspecified: Secondary | ICD-10-CM

## 2021-05-10 DIAGNOSIS — Z8572 Personal history of non-Hodgkin lymphomas: Secondary | ICD-10-CM | POA: Diagnosis not present

## 2021-05-10 LAB — CBC WITH DIFFERENTIAL/PLATELET
Abs Immature Granulocytes: 0.02 10*3/uL (ref 0.00–0.07)
Basophils Absolute: 0.1 10*3/uL (ref 0.0–0.1)
Basophils Relative: 1 %
Eosinophils Absolute: 0.2 10*3/uL (ref 0.0–0.5)
Eosinophils Relative: 2 %
HCT: 41 % (ref 39.0–52.0)
Hemoglobin: 12.9 g/dL — ABNORMAL LOW (ref 13.0–17.0)
Immature Granulocytes: 0 %
Lymphocytes Relative: 8 %
Lymphs Abs: 0.5 10*3/uL — ABNORMAL LOW (ref 0.7–4.0)
MCH: 33 pg (ref 26.0–34.0)
MCHC: 31.5 g/dL (ref 30.0–36.0)
MCV: 104.9 fL — ABNORMAL HIGH (ref 80.0–100.0)
Monocytes Absolute: 0.8 10*3/uL (ref 0.1–1.0)
Monocytes Relative: 11 %
Neutro Abs: 5.3 10*3/uL (ref 1.7–7.7)
Neutrophils Relative %: 78 %
Platelets: 102 10*3/uL — ABNORMAL LOW (ref 150–400)
RBC: 3.91 MIL/uL — ABNORMAL LOW (ref 4.22–5.81)
RDW: 13.9 % (ref 11.5–15.5)
WBC: 6.8 10*3/uL (ref 4.0–10.5)
nRBC: 0 % (ref 0.0–0.2)

## 2021-05-10 LAB — COMPREHENSIVE METABOLIC PANEL
ALT: 14 U/L (ref 0–44)
AST: 20 U/L (ref 15–41)
Albumin: 4.3 g/dL (ref 3.5–5.0)
Alkaline Phosphatase: 116 U/L (ref 38–126)
Anion gap: 9 (ref 5–15)
BUN: 15 mg/dL (ref 8–23)
CO2: 33 mmol/L — ABNORMAL HIGH (ref 22–32)
Calcium: 9.5 mg/dL (ref 8.9–10.3)
Chloride: 101 mmol/L (ref 98–111)
Creatinine, Ser: 0.87 mg/dL (ref 0.61–1.24)
GFR, Estimated: >60 mL/min (ref >60)
Glucose, Bld: 101 mg/dL — ABNORMAL HIGH (ref 70–99)
Potassium: 5.2 mmol/L — ABNORMAL HIGH (ref 3.5–5.1)
Sodium: 143 mmol/L (ref 135–145)
Total Bilirubin: 0.9 mg/dL (ref 0.3–1.2)
Total Protein: 7.3 g/dL (ref 6.5–8.1)

## 2021-05-10 NOTE — Telephone Encounter (Signed)
-----   Message from Heath Lark, MD sent at 05/10/2021  8:26 AM EDT ----- Pls call him, potassium is mildly elevated but stable

## 2021-05-10 NOTE — Assessment & Plan Note (Signed)
Last CT scan showed no signs of disease. Clinically, he has no signs or symptoms to suggest cancer recurrence I will see him back in 12 months with repeat history, physical examination and blood work I do not recommend routine surveillance imaging study

## 2021-05-10 NOTE — Assessment & Plan Note (Signed)
This is likely due to fatty liver disease. The patient denies recent history of bleeding such as epistaxis, hematuria or hematochezia. He is asymptomatic from the low platelet count. I will observe for now.

## 2021-05-10 NOTE — Assessment & Plan Note (Signed)
He has significant wheezes on exam, likely exacerbated by recent COVID-19 infection I recommend the patient to use nebulizers on a regular basis Apparently, the patient has quit smoking

## 2021-05-10 NOTE — Telephone Encounter (Signed)
Called and left below message. Ask him to call the office for questions. 

## 2021-05-10 NOTE — Progress Notes (Signed)
[facility] OFFICE PROGRESS NOTE  Patient Care Team: Plotnikov, [name], MD as PCP - General (Internal Medicine) [name], MD as PCP - Cardiology (Cardiology) [name], MD as Consulting Physician (Cardiology) Heath Lark, MD as Consulting Physician (Hematology and Oncology) Jerrell Belfast, MD as Consulting Physician (Otolaryngology) Daryll Brod, MD as Consulting Physician (Orthopedic Surgery) Brand Males, MD as Consulting Physician (Pulmonary Disease) Knox Royalty, RN as Case Manager  ASSESSMENT & PLAN:  Follicular lymphoma [facility]) Last CT scan showed no signs of disease. Clinically, he has no signs or symptoms to suggest cancer recurrence I will see him back in 12 months with repeat history, physical examination and blood work I do not recommend routine surveillance imaging study  COPD mixed type ([place]) He has significant wheezes on exam, likely exacerbated by recent COVID-19 infection I recommend the patient to use nebulizers on a regular basis Apparently, the patient has quit smoking  Thrombocytopenia ([place]) This is likely due to fatty liver disease. The patient denies recent history of bleeding such as epistaxis, hematuria or hematochezia. He is asymptomatic from the low platelet count. I will observe for now.  No orders of the defined types were placed in this encounter.   All questions were answered. The patient knows to call the clinic with any problems, questions or concerns. The total time spent in the appointment was 20 minutes encounter with patients including review of chart and various tests results, discussions about plan of care and coordination of care plan   Heath Lark, MD 05/10/2021 8:24 AM  INTERVAL HISTORY: Please see below for problem oriented charting. he returns for follow-up on history of lymphoma According to the patient, he contracted COVID-19 infection in the first week of the month He recovered fully but has  shortness of breath on minimal exertion He has been prescribed nebulizer treatment for COPD but only use it twice a week He has no new lymphadenopathy He has cysts in his skin that were removed by dermatologist and they were benign  REVIEW OF SYSTEMS:   Constitutional: Denies fevers, chills or abnormal weight loss Eyes: Denies blurriness of vision Ears, nose, mouth, throat, and face: Denies mucositis or sore throat Respiratory: Denies cough, dyspnea or wheezes Cardiovascular: Denies palpitation, chest discomfort or lower extremity swelling Gastrointestinal:  Denies nausea, heartburn or change in bowel habits Skin: Denies abnormal skin rashes Lymphatics: Denies new lymphadenopathy or easy bruising Neurological:Denies numbness, tingling or new weaknesses Behavioral/Psych: Mood is stable, no new changes  All other systems were reviewed with the patient and are negative.  I have reviewed the past medical history, past surgical history, social history and family history with the patient and they are unchanged from previous note.  ALLERGIES:  is allergic to bendamustine hcl, heparin, and tape.  MEDICATIONS:  Current Outpatient Medications  Medication Sig Dispense Refill   albuterol (PROVENTIL) (2.5 MG/3ML) 0.083% nebulizer solution TAKE 3 MLS (2.5 MG TOTAL) BY NEBULIZATION DAILY AS NEEDED FOR WHEEZING OR SHORTNESS OF BREATH. 75 mL 5   ANORO ELLIPTA 62.5-25 MCG/INH AEPB Inhale 1 puff into the lungs daily as needed. For wheezing & SOB 180 each 3   aspirin EC 81 MG tablet Take 81 mg by mouth every evening.      clonazePAM (KLONOPIN) 0.5 MG tablet 1 po bid prn 60 tablet 3   clopidogrel (PLAVIX) 75 MG tablet TAKE 1 TABLET BY MOUTH EVERY DAY WITH BREAKFAST 90 tablet 3   gabapentin (NEURONTIN) 300 MG capsule Take 1  capsule (300 mg total) by mouth 3 (three) times daily. (Patient not taking: Reported on 05/06/2021) 90 capsule 3   metolazone (ZAROXOLYN) 5 MG tablet TAKE 1 TABLET BY MOUTH EVERY DAY  (Patient not taking: Reported on 05/06/2021) 30 tablet 11   metoprolol succinate (TOPROL-XL) 50 MG 24 hr tablet Take 1 tablet (50 mg total) by mouth daily. Take with or immediately following a meal. 90 tablet 3   nitroGLYCERIN (NITROSTAT) 0.4 MG SL tablet Place 1 tablet (0.4 mg total) under the tongue every 5 (five) minutes as needed for chest pain (Up to 3 doses). 25 tablet 1   oxyCODONE-acetaminophen (PERCOCET/ROXICET) 5-325 MG tablet Take 1 tablet by mouth every 6 (six) hours as needed for severe pain. 120 tablet 0   oxyCODONE-acetaminophen (PERCOCET/ROXICET) 5-325 MG tablet Take 1 tablet by mouth every 6 (six) hours as needed for severe pain. 120 tablet 0   oxymetazoline (AFRIN) 0.05 % nasal spray Place 1 spray into both nostrils daily as needed for congestion.     simvastatin (ZOCOR) 20 MG tablet TAKE 1 TABLET AT BEDTIME 90 tablet 3   torsemide (DEMADEX) 100 MG tablet Take 1 tablet (100 mg total) by mouth daily. 90 tablet 3   No current facility-administered medications for this visit.    SUMMARY OF ONCOLOGIC HISTORY: Oncology History Overview Note  FLIPI score of 2; stage 3 and areas of involvement >4   Follicular lymphoma ([place])  06/24/2009 Imaging   PET CT scan showed two small FDG positive left neck nodes   12/11/2014 Surgery   He underwent excisional lymph node biopsy of the left supraclavicular lymph node/neck region   12/11/2014 Pathology Results   Accession: [id number] biopsy show follicular lymphoma   09/01/1476 Imaging   ECHO showed LVH but preserved EF   12/25/2014 Imaging   PET scan showed disease above and below diaphragm   12/28/2014 Procedure   He has port placement   12/31/2014 - 01/01/2015 Chemotherapy   He received 1 cycle of bendamustine with rituximab, discontinued due to suspicion of allergic reaction to bendamustine   01/12/2015 - 01/19/2015 Hospital Admission   He was admitted to the hospital with suspicious allergic reaction, significant bilateral lower  extremity edema with cellulitis, pneumonia and mild fluid overload   03/18/2015 - 04/08/2015 Chemotherapy   Treatment is switched to rituximab, Cytoxan, vincristine and prednisone   04/29/2015 Imaging    PET CT scan showed near complete response to treatment.   04/30/2015 - 01/12/2017 Chemotherapy   He received maintenance Rituxan   11/03/2016 PET scan   No metabolic evidence of recurrent lymphoma. Mild patchy marrow hypermetabolism throughout the axial skeleton, unchanged, probably due to a mildly reactive marrow state. 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   04/19/2017 Imaging   1. No evidence for lymphadenopathy in the chest, abdomen, or pelvis. 2.  Aortic Atherosclerois (ICD10-170.0) 3. 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 4. Bilateral nonobstructing nephrolithiasis. 5. Cholelithiasis. 6. Hepatic steatosis   05/18/2017 Procedure   Successful removal of implanted Port-A-Cath     PHYSICAL EXAMINATION: ECOG PERFORMANCE STATUS: 1 - Symptomatic but completely ambulatory GENERAL:alert, no distress and comfortable SKIN: skin color, texture, turgor are normal, no rashes or significant lesions.  Noted old skin bruises EYES: normal, Conjunctiva are pink and non-injected, sclera clear OROPHARYNX:no exudate,  no erythema and lips, buccal mucosa, and tongue normal  NECK: supple, thyroid normal size, non-tender, without nodularity LYMPH:  no palpable lymphadenopathy in the cervical, axillary or inguinal LUNGS: He has diffuse wheezes bilateral lungs on exam HEART: regular rate & rhythm and no murmurs with moderate bilateral lower  extremity edema ABDOMEN:abdomen soft, non-tender and normal bowel sounds Musculoskeletal:no cyanosis of digits and no clubbing  NEURO: alert & oriented x 3 with fluent speech, no focal motor/sensory deficits  LABORATORY DATA:  I have reviewed the data as listed    Component Value Date/Time   NA 140 03/21/2021 1021   NA 141 01/01/2018 0932   NA 141 04/19/2017 0822   K 5.4 (H) 03/21/2021 1021   K 4.1 04/19/2017 0822   CL 100 03/21/2021 1021   CO2 35 (H) 03/21/2021 1021   CO2 33 (H) 04/19/2017 0822   GLUCOSE 107 (H) 03/21/2021 1021   GLUCOSE 113 04/19/2017 0822   BUN 11 03/21/2021 1021   BUN 14 01/01/2018 0932   BUN 8.7 04/19/2017 0822   CREATININE 0.81 03/21/2021 1021   CREATININE 0.87 07/25/2018 1442   CREATININE 0.7 04/19/2017 0822   CALCIUM 9.9 03/21/2021 1021   CALCIUM 9.5 04/19/2017 0822   PROT 7.4 03/21/2021 1021   PROT 7.1 01/01/2018 0932   PROT 6.8 04/19/2017 0822   ALBUMIN 4.5 03/21/2021 1021   ALBUMIN 4.6 01/01/2018 0932   ALBUMIN 3.8 04/19/2017 0822   AST 21 03/21/2021 1021   AST 16 07/25/2018 1442   AST 18 04/19/2017 0822   ALT 15 03/21/2021 1021   ALT 15 07/25/2018 1442   ALT 21 04/19/2017 0822   ALKPHOS 81 03/21/2021 1021   ALKPHOS 82 04/19/2017 0822   BILITOT 0.8 03/21/2021 1021   BILITOT 0.3 07/25/2018 1442   BILITOT 0.53 04/19/2017 0822   GFRNONAA >60 04/27/2020 1327   GFRNONAA >60 07/25/2018 1442   GFRAA >60 04/27/2020 1327   GFRAA >60 07/25/2018 1442    No results found for: SPEP, UPEP  Lab Results  Component Value Date   WBC 6.8 05/10/2021   NEUTROABS 5.3 05/10/2021   HGB 12.9 (L) 05/10/2021   HCT 41.0 05/10/2021   MCV 104.9 (H) 05/10/2021   PLT 102 (L) 05/10/2021      Chemistry      Component Value Date/Time   NA 140 03/21/2021 1021   NA 141 01/01/2018 0932   NA 141 04/19/2017 0822   K 5.4 (H) 03/21/2021 1021   K 4.1 04/19/2017 0822   CL 100 03/21/2021 1021   CO2 35 (H) 03/21/2021 1021   CO2 33 (H) 04/19/2017 0822   BUN 11  03/21/2021 1021   BUN 14 01/01/2018 0932   BUN 8.7 04/19/2017 0822   CREATININE 0.81 03/21/2021 1021   CREATININE 0.87 07/25/2018 1442   CREATININE 0.7 04/19/2017 0822      Component Value Date/Time   CALCIUM 9.9 03/21/2021 1021   CALCIUM 9.5 04/19/2017 0822   ALKPHOS 81 03/21/2021 1021   ALKPHOS 82 04/19/2017 0822   AST 21 03/21/2021 1021   AST 16 07/25/2018 1442   AST 18 04/19/2017 0822   ALT 15 03/21/2021 1021   ALT 15 07/25/2018 1442   ALT 21 04/19/2017 0822   BILITOT 0.8 03/21/2021 1021   BILITOT 0.3 07/25/2018 1442   BILITOT 0.53 04/19/2017 2878

## 2021-05-13 DIAGNOSIS — E785 Hyperlipidemia, unspecified: Secondary | ICD-10-CM

## 2021-05-13 DIAGNOSIS — I10 Essential (primary) hypertension: Secondary | ICD-10-CM

## 2021-05-31 ENCOUNTER — Other Ambulatory Visit (HOSPITAL_BASED_OUTPATIENT_CLINIC_OR_DEPARTMENT_OTHER): Payer: Self-pay | Admitting: Cardiovascular Disease

## 2021-05-31 DIAGNOSIS — I771 Stricture of artery: Secondary | ICD-10-CM

## 2021-05-31 DIAGNOSIS — Z9889 Other specified postprocedural states: Secondary | ICD-10-CM

## 2021-06-17 ENCOUNTER — Telehealth: Payer: Self-pay | Admitting: Internal Medicine

## 2021-06-17 NOTE — Telephone Encounter (Signed)
Left message for patient to call me back at [phone] to schedule Medicare Annual Wellness Visit   Last AWV  03/16/20  Please schedule at anytime with [facility] if patient calls the office back.    40 Minutes appointment   Any questions, please call me at [phone]

## 2021-06-21 ENCOUNTER — Encounter: Payer: Self-pay | Admitting: Internal Medicine

## 2021-06-21 ENCOUNTER — Ambulatory Visit (INDEPENDENT_AMBULATORY_CARE_PROVIDER_SITE_OTHER): Payer: Medicare Other | Admitting: Internal Medicine

## 2021-06-21 ENCOUNTER — Other Ambulatory Visit: Payer: Self-pay

## 2021-06-21 VITALS — BP 134/58 | HR 77 | Temp 97.8°F | Ht 72.0 in | Wt 256.0 lb

## 2021-06-21 DIAGNOSIS — I6523 Occlusion and stenosis of bilateral carotid arteries: Secondary | ICD-10-CM

## 2021-06-21 DIAGNOSIS — M79604 Pain in right leg: Secondary | ICD-10-CM

## 2021-06-21 DIAGNOSIS — J449 Chronic obstructive pulmonary disease, unspecified: Secondary | ICD-10-CM | POA: Diagnosis not present

## 2021-06-21 DIAGNOSIS — M79605 Pain in left leg: Secondary | ICD-10-CM

## 2021-06-21 DIAGNOSIS — F411 Generalized anxiety disorder: Secondary | ICD-10-CM | POA: Diagnosis not present

## 2021-06-21 DIAGNOSIS — M545 Low back pain, unspecified: Secondary | ICD-10-CM

## 2021-06-21 DIAGNOSIS — Z8616 Personal history of COVID-19: Secondary | ICD-10-CM | POA: Diagnosis not present

## 2021-06-21 DIAGNOSIS — Z23 Encounter for immunization: Secondary | ICD-10-CM | POA: Diagnosis not present

## 2021-06-21 MED ORDER — METHYLPREDNISOLONE 4 MG PO TBPK: ORAL_TABLET | ORAL | 0 refills | Status: DC

## 2021-06-21 MED ORDER — OXYCODONE-ACETAMINOPHEN 5-325 MG PO TABS: 1.0000 | ORAL_TABLET | Freq: Four times a day (QID) | ORAL | 0 refills | Status: DC | PRN

## 2021-06-21 MED ORDER — AZITHROMYCIN 250 MG PO TABS: ORAL_TABLET | ORAL | 0 refills | Status: DC

## 2021-06-21 NOTE — Progress Notes (Signed)
Subjective:  Patient ID: [name], male    DOB: [birth year]  Age: 65 y.o. MRN: [id number]  CC: Follow-up (3 month f/u- Flu shot)   HPI [name] presents for LBP, COPD, anxiety Pt had COVID in 9/22. C/o URI sx's - sinusitis sx's, wheezing  Outpatient Medications Prior to Visit  Medication Sig Dispense Refill   albuterol (PROVENTIL) (2.5 MG/3ML) 0.083% nebulizer solution TAKE 3 MLS (2.5 MG TOTAL) BY NEBULIZATION DAILY AS NEEDED FOR WHEEZING OR SHORTNESS OF BREATH. 75 mL 5   ANORO ELLIPTA 62.5-25 MCG/INH AEPB Inhale 1 puff into the lungs daily as needed. For wheezing & SOB 180 each 3   aspirin EC 81 MG tablet Take 81 mg by mouth every evening.      clonazePAM (KLONOPIN) 0.5 MG tablet 1 po bid prn 60 tablet 3   clopidogrel (PLAVIX) 75 MG tablet TAKE 1 TABLET BY MOUTH EVERY DAY WITH BREAKFAST 90 tablet 3   KLOR-CON M10 10 MEQ tablet Take 10 mEq by mouth daily. Take 1 by mouth daily     metolazone (ZAROXOLYN) 5 MG tablet TAKE 1 TABLET BY MOUTH EVERY DAY (Patient not taking: No sig reported) 30 tablet 11   metoprolol succinate (TOPROL-XL) 50 MG 24 hr tablet Take 1 tablet (50 mg total) by mouth daily. Take with or immediately following a meal. 90 tablet 3   nitroGLYCERIN (NITROSTAT) 0.4 MG SL tablet Place 1 tablet (0.4 mg total) under the tongue every 5 (five) minutes as needed for chest pain (Up to 3 doses). 25 tablet 1   oxymetazoline (AFRIN) 0.05 % nasal spray Place 1 spray into both nostrils daily as needed for congestion.     simvastatin (ZOCOR) 20 MG tablet TAKE 1 TABLET AT BEDTIME 90 tablet 3   torsemide (DEMADEX) 100 MG tablet Take 1 tablet (100 mg total) by mouth daily. 90 tablet 3   gabapentin (NEURONTIN) 300 MG capsule Take 1 capsule (300 mg total) by mouth 3 (three) times daily. (Patient not taking: No sig reported) 90 capsule 3   oxyCODONE-acetaminophen (PERCOCET/ROXICET) 5-325 MG tablet Take 1 tablet by mouth every 6 (six) hours as needed for severe pain. 120  tablet 0   oxyCODONE-acetaminophen (PERCOCET/ROXICET) 5-325 MG tablet Take 1 tablet by mouth every 6 (six) hours as needed for severe pain. 120 tablet 0   No facility-administered medications prior to visit.    ROS: Review of Systems  Constitutional:  Negative for appetite change, fatigue and unexpected weight change.  HENT:  Positive for congestion, postnasal drip and rhinorrhea. Negative for nosebleeds, sneezing, sore throat and trouble swallowing.   Eyes:  Negative for itching and visual disturbance.  Respiratory:  Positive for cough and wheezing.   Cardiovascular:  Positive for leg swelling. Negative for chest pain and palpitations.  Gastrointestinal:  Negative for abdominal distention, blood in stool, diarrhea and nausea.  Genitourinary:  Negative for frequency and hematuria.  Musculoskeletal:  Negative for back pain, gait problem, joint swelling and neck pain.  Skin:  Positive for color change. Negative for rash.  Neurological:  Negative for dizziness, tremors, speech difficulty and weakness.  Psychiatric/Behavioral:  Negative for agitation, dysphoric mood and sleep disturbance. The patient is not nervous/anxious.    Objective:  BP (!) 134/58 (BP Location: Left Arm)   Pulse 77   Temp 97.8 F (36.6 C) (Oral)   Ht 6' (1.829 m)   Wt 256 lb (116.1 kg)   SpO2 95%   BMI 34.72 kg/m   BP  Readings from Last 3 Encounters:  06/21/21 (!) 134/58  05/10/21 (!) 142/85  03/21/21 138/72    Wt Readings from Last 3 Encounters:  06/21/21 256 lb (116.1 kg)  03/21/21 261 lb 12.8 oz (118.8 kg)  12/20/20 273 lb 12.8 oz (124.2 kg)    Physical Exam Constitutional:      General: He is not in acute distress.    Appearance: He is well-developed. He is obese.     Comments: NAD  Eyes:     Conjunctiva/sclera: Conjunctivae normal.     Pupils: Pupils are equal, round, and reactive to light.  Neck:     Thyroid: No thyromegaly.     Vascular: No JVD.  Cardiovascular:     Rate and Rhythm:  Normal rate and regular rhythm.     Heart sounds: Normal heart sounds. No murmur heard.   No friction rub. No gallop.  Pulmonary:     Effort: Pulmonary effort is normal. No respiratory distress.     Breath sounds: Normal breath sounds. No wheezing or rales.  Chest:     Chest wall: No tenderness.  Abdominal:     General: Bowel sounds are normal. There is no distension.     Palpations: Abdomen is soft. There is no mass.     Tenderness: There is no abdominal tenderness. There is no guarding or rebound.  Musculoskeletal:        General: Tenderness present. Normal range of motion.     Cervical back: Normal range of motion.     Right lower leg: Edema present.     Left lower leg: Edema present.  Lymphadenopathy:     Cervical: No cervical adenopathy.  Skin:    General: Skin is warm and dry.     Findings: Bruising and rash present.  Neurological:     Mental Status: He is alert and oriented to person, place, and time.     Cranial Nerves: No cranial nerve deficit.     Motor: No abnormal muscle tone.     Coordination: Coordination normal.     Gait: Gait abnormal.     Deep Tendon Reflexes: Reflexes are normal and symmetric.  Psychiatric:        Behavior: Behavior normal.        Thought Content: Thought content normal.        Judgment: Judgment normal.  LS tender Antalgic gait Ankles w/swelling - better Swollen nares  Lab Results  Component Value Date   WBC 6.8 05/10/2021   HGB 12.9 (L) 05/10/2021   HCT 41.0 05/10/2021   PLT 102 (L) 05/10/2021   GLUCOSE 101 (H) 05/10/2021   CHOL 108 01/01/2018   TRIG 98 01/01/2018   HDL 35 (L) 01/01/2018   LDLCALC 53 01/01/2018   ALT 14 05/10/2021   AST 20 05/10/2021   NA 143 05/10/2021   K 5.2 (H) 05/10/2021   CL 101 05/10/2021   CREATININE 0.87 05/10/2021   BUN 15 05/10/2021   CO2 33 (H) 05/10/2021   TSH 0.80 02/17/2020   PSA 0.23 09/15/2011   INR 1.0 11/28/2017   HGBA1C 5.4 09/14/2020   MICROALBUR 0.7 03/29/2016    VAS US  CAROTID  Result Date: 01/22/2021 Carotid Arterial Duplex Study Patient Name:  [name]  Date of Exam:   01/21/2021 Medical Rec #: [id number]            Accession #:    [id number] Date of Birth: [birth year]  Patient Gender: M Patient Age:   57Y Exam Location:  [facility] Procedure:      VAS US CAROTID Referring Phys: [id number] PETER C NISHAN --------------------------------------------------------------------------------  Indications:       Carotid artery disease, left endarterectomy and patient                    denies any cerebrovascular symptoms today. Risk Factors:      Hypertension, hyperlipidemia, Diabetes, past history of                    smoking, coronary artery disease. Other Factors:     Left carotid endarterectomy 2010                     CABG                    COPD. Comparison Study:  Prior carotid duplex exam on 01/22/2020 showed highest                    velocities in right proximal ICA 100/18 cm/s and left mid ICA                    76/17 cm/s, s/p left CEA. Left subclavian artery 405 cm/s. Performing Technologist: Salvadore Dom RVT, RDCS (AE), RDMS  Examination Guidelines: A complete evaluation includes B-mode imaging, spectral Doppler, color Doppler, and power Doppler as needed of all accessible portions of each vessel. Bilateral testing is considered an integral part of a complete examination. Limited examinations for reoccurring indications may be performed as noted.  Right Carotid Findings: +----------+--------+--------+--------+--------------------+--------+           PSV cm/sEDV cm/sStenosisPlaque Description  Comments +----------+--------+--------+--------+--------------------+--------+ CCA Prox  71      13                                           +----------+--------+--------+--------+--------------------+--------+ CCA Mid   64      15              focal and calcific           +----------+--------+--------+--------+--------------------+--------+ CCA  Distal58      15              diffuse and calcific         +----------+--------+--------+--------+--------------------+--------+ ICA Prox  103     31      1-39%   diffuse and calcific         +----------+--------+--------+--------+--------------------+--------+ ICA Mid   101     28                                           +----------+--------+--------+--------+--------------------+--------+ ICA Distal76      21                                           +----------+--------+--------+--------+--------------------+--------+ ECA       186     19      >50%    diffuse and calcific         +----------+--------+--------+--------+--------------------+--------+ +----------+--------+-------+----------------+-------------------+  PSV cm/sEDV cmsDescribe        Arm Pressure (mmHG) +----------+--------+-------+----------------+-------------------+ [id number]            Multiphasic, TRR116                 +----------+--------+-------+----------------+-------------------+ +---------+--------+--+--------+-+---------+ VertebralPSV cm/s35EDV cm/s8Antegrade +---------+--------+--+--------+-+---------+  Left Carotid Findings: +----------+--------+--------+--------+--------------------+-------------------+           PSV cm/sEDV cm/sStenosisPlaque Description  Comments            +----------+--------+--------+--------+--------------------+-------------------+ CCA Prox  136     17                                  spectral broadening                                                       noted               +----------+--------+--------+--------+--------------------+-------------------+ CCA Mid   56      13              diffuse and calcific                    +----------+--------+--------+--------+--------------------+-------------------+ CCA Distal54      13                                  s/p CEA              +----------+--------+--------+--------+--------------------+-------------------+ ICA Prox  50      18              focal and calcific  s/p CEA             +----------+--------+--------+--------+--------------------+-------------------+ ICA Mid   89      20      1-39%                                           +----------+--------+--------+--------+--------------------+-------------------+ ICA Distal74      27                                                      +----------+--------+--------+--------+--------------------+-------------------+ ECA       317     21      >50%                                            +----------+--------+--------+--------+--------------------+-------------------+ +----------+--------+--------+--------+-------------------+           PSV cm/sEDV cm/sDescribeArm Pressure (mmHG) +----------+--------+--------+--------+-------------------+ [id number]             [id number]                 +----------+--------+--------+--------+-------------------+ +---------+--------+--+--------+--+---------+ VertebralPSV cm/s55EDV cm/s14Antegrade +---------+--------+--+--------+--+---------+   Summary: Right Carotid: Velocities in the right ICA are consistent with a stable 1-39%  stenosis. Non-hemodynamically significant plaque <50% noted in                the CCA. The ECA appears >50% stenosed. Left Carotid: Velocities in the left ICA are consistent with a stable 1-39%               stenosis. Non-hemodynamically significant plaque <50% noted in the               CCA. The ECA appears >50% stenosed. S/p CEA. Vertebrals:  Bilateral vertebral arteries demonstrate antegrade flow. Subclavians: Left subclavian artery was stenotic. Normal flow hemodynamics were              seen in the right subclavian artery. *See table(s) above for measurements and observations. Suggest follow up study in 12 months. Electronically signed by Larae Grooms MD on  01/22/2021 at 8:35:49 PM.    Final     Assessment & Plan:   Problem List Items Addressed This Visit     Anxiety state    Cont on Clonazepam prn Not to take w/pain meds  Potential benefits of a long term benzodiazepines  use as well as potential risks  and complications were explained to the patient and were aknowledged.      COPD mixed type (HCC)    Worse Post-COVID Medrol pack, Zpack      Relevant Medications   methylPREDNISolone (MEDROL DOSEPAK) 4 MG TBPK tablet   azithromycin (ZITHROMAX Z-PAK) 250 MG tablet   History of COVID-19     Post-COVID sinusitis/COPD Medrol pack, Zpack      Low back pain radiating to both legs    Continue on Percocet prn Not to take w/Clonazepam  Potential benefits of a long term opioids use as well as potential risks (i.e. addiction risk, apnea etc) and complications (i.e. Somnolence, constipation and others) were explained to the patient and were aknowledged.      Relevant Medications   oxyCODONE-acetaminophen (PERCOCET/ROXICET) 5-325 MG tablet   oxyCODONE-acetaminophen (PERCOCET/ROXICET) 5-325 MG tablet   methylPREDNISolone (MEDROL DOSEPAK) 4 MG TBPK tablet   Other Visit Diagnoses     Needs flu shot    -  Primary   Relevant Orders   Flu Vaccine QUAD High Dose(Fluad) (Completed)         Meds ordered this encounter  Medications   oxyCODONE-acetaminophen (PERCOCET/ROXICET) 5-325 MG tablet    Sig: Take 1 tablet by mouth every 6 (six) hours as needed for severe pain.    Dispense:  120 tablet    Refill:  0    Please fill on or after 07/21/21   oxyCODONE-acetaminophen (PERCOCET/ROXICET) 5-325 MG tablet    Sig: Take 1 tablet by mouth every 6 (six) hours as needed for severe pain.    Dispense:  120 tablet    Refill:  0    Please fill on or after 06/21/21   methylPREDNISolone (MEDROL DOSEPAK) 4 MG TBPK tablet    Sig: As directed    Dispense:  21 tablet    Refill:  0   azithromycin (ZITHROMAX Z-PAK) 250 MG tablet    Sig: As directed     Dispense:  6 tablet    Refill:  0       Follow-up: No follow-ups on file.  Walker Kehr, MD

## 2021-06-21 NOTE — Assessment & Plan Note (Signed)
Worse Post-COVID Medrol pack, Zpack

## 2021-06-21 NOTE — Assessment & Plan Note (Signed)
Cont on Clonazepam prn Not to take w/pain meds  Potential benefits of a long term benzodiazepines  use as well as potential risks  and complications were explained to the patient and were aknowledged. 

## 2021-06-21 NOTE — Assessment & Plan Note (Signed)
Continue on Percocet prn Not to take w/Clonazepam  Potential benefits of a long term opioids use as well as potential risks (i.e. addiction risk, apnea etc) and complications (i.e. Somnolence, constipation and others) were explained to the patient and were aknowledged.

## 2021-06-21 NOTE — Assessment & Plan Note (Signed)
  Post-COVID sinusitis/COPD Medrol pack, Zpack

## 2021-07-18 ENCOUNTER — Other Ambulatory Visit: Payer: Self-pay | Admitting: Internal Medicine

## 2021-07-22 ENCOUNTER — Other Ambulatory Visit: Payer: Self-pay | Admitting: Internal Medicine

## 2021-07-29 ENCOUNTER — Other Ambulatory Visit: Payer: Self-pay | Admitting: Internal Medicine

## 2021-07-29 ENCOUNTER — Encounter: Payer: Self-pay | Admitting: Internal Medicine

## 2021-07-29 NOTE — Telephone Encounter (Signed)
Plot already filled clonazepam and he should still have refill at pharmacy of his oxycodone (not due until 08/20/21)

## 2021-08-04 ENCOUNTER — Encounter: Payer: Self-pay | Admitting: Internal Medicine

## 2021-09-10 ENCOUNTER — Encounter: Payer: Self-pay | Admitting: Internal Medicine

## 2021-09-13 ENCOUNTER — Other Ambulatory Visit: Payer: Self-pay | Admitting: Internal Medicine

## 2021-09-13 MED ORDER — OXYCODONE-ACETAMINOPHEN 5-325 MG PO TABS: 1.0000 | ORAL_TABLET | Freq: Four times a day (QID) | ORAL | 0 refills | Status: DC | PRN

## 2021-09-26 ENCOUNTER — Other Ambulatory Visit: Payer: Self-pay

## 2021-09-26 ENCOUNTER — Encounter: Payer: Self-pay | Admitting: Internal Medicine

## 2021-09-26 ENCOUNTER — Ambulatory Visit (INDEPENDENT_AMBULATORY_CARE_PROVIDER_SITE_OTHER): Payer: Medicare Other | Admitting: Internal Medicine

## 2021-09-26 DIAGNOSIS — M79605 Pain in left leg: Secondary | ICD-10-CM | POA: Diagnosis not present

## 2021-09-26 DIAGNOSIS — F413 Other mixed anxiety disorders: Secondary | ICD-10-CM | POA: Diagnosis not present

## 2021-09-26 DIAGNOSIS — J449 Chronic obstructive pulmonary disease, unspecified: Secondary | ICD-10-CM

## 2021-09-26 DIAGNOSIS — M545 Low back pain, unspecified: Secondary | ICD-10-CM | POA: Diagnosis not present

## 2021-09-26 DIAGNOSIS — M79604 Pain in right leg: Secondary | ICD-10-CM

## 2021-09-26 DIAGNOSIS — I5032 Chronic diastolic (congestive) heart failure: Secondary | ICD-10-CM | POA: Diagnosis not present

## 2021-09-26 DIAGNOSIS — E1151 Type 2 diabetes mellitus with diabetic peripheral angiopathy without gangrene: Secondary | ICD-10-CM | POA: Diagnosis not present

## 2021-09-26 LAB — CBC WITH DIFFERENTIAL/PLATELET
Basophils Absolute: 0.1 10*3/uL (ref 0.0–0.1)
Basophils Relative: 1.4 % (ref 0.0–3.0)
Eosinophils Absolute: 0.2 10*3/uL (ref 0.0–0.7)
Eosinophils Relative: 2.4 % (ref 0.0–5.0)
HCT: 39.9 % (ref 39.0–52.0)
Hemoglobin: 13.2 g/dL (ref 13.0–17.0)
Lymphocytes Relative: 9.3 % — ABNORMAL LOW (ref 12.0–46.0)
Lymphs Abs: 0.6 10*3/uL — ABNORMAL LOW (ref 0.7–4.0)
MCHC: 33.2 g/dL (ref 30.0–36.0)
MCV: 104 fl — ABNORMAL HIGH (ref 78.0–100.0)
Monocytes Absolute: 0.9 10*3/uL (ref 0.1–1.0)
Monocytes Relative: 14 % — ABNORMAL HIGH (ref 3.0–12.0)
Neutro Abs: 4.9 10*3/uL (ref 1.4–7.7)
Neutrophils Relative %: 72.9 % (ref 43.0–77.0)
Platelets: 88 10*3/uL — ABNORMAL LOW (ref 150.0–400.0)
RBC: 3.83 Mil/uL — ABNORMAL LOW (ref 4.22–5.81)
RDW: 14.9 % (ref 11.5–15.5)
WBC: 6.8 10*3/uL (ref 4.0–10.5)

## 2021-09-26 LAB — TSH: TSH: 0.87 u[IU]/mL (ref 0.35–5.50)

## 2021-09-26 LAB — HEMOGLOBIN A1C: Hgb A1c MFr Bld: 5.2 % (ref 4.6–6.5)

## 2021-09-26 MED ORDER — OXYCODONE-ACETAMINOPHEN 7.5-325 MG PO TABS: 1.0000 | ORAL_TABLET | Freq: Four times a day (QID) | ORAL | 0 refills | Status: DC | PRN

## 2021-09-26 NOTE — Progress Notes (Signed)
Subjective:  Patient ID: [name], male    DOB: 1956/06/23  Age: 66 y.o. MRN: [id number]  CC: No chief complaint on file.   HPI [name] presents for LBP, CHF, edema, anxiety. C/o developing Oxy tolerance: pain is 5/10 all the time or more lately... He can't do what he needs due to pain... C/o Anoro ellipta was too $$$  Outpatient Medications Prior to Visit  Medication Sig Dispense Refill   albuterol (PROVENTIL) (2.5 MG/3ML) 0.083% nebulizer solution TAKE 3 MLS (2.5 MG TOTAL) BY NEBULIZATION DAILY AS NEEDED FOR WHEEZING OR SHORTNESS OF BREATH. 75 mL 5   ANORO ELLIPTA 62.5-25 MCG/INH AEPB Inhale 1 puff into the lungs daily as needed. For wheezing & SOB 180 each 3   aspirin EC 81 MG tablet Take 81 mg by mouth every evening.      azithromycin (ZITHROMAX Z-PAK) 250 MG tablet As directed 6 tablet 0   clonazePAM (KLONOPIN) 0.5 MG tablet TAKE 1 TABLET BY MOUTH TWICE A DAY AS NEEDED 60 tablet 3   clopidogrel (PLAVIX) 75 MG tablet TAKE 1 TABLET BY MOUTH EVERY DAY WITH BREAKFAST 90 tablet 3   KLOR-CON M10 10 MEQ tablet Take 10 mEq by mouth daily. Take 1 by mouth daily     methylPREDNISolone (MEDROL DOSEPAK) 4 MG TBPK tablet As directed 21 tablet 0   metolazone (ZAROXOLYN) 5 MG tablet TAKE 1 TABLET BY MOUTH EVERY DAY 30 tablet 11   metoprolol succinate (TOPROL-XL) 50 MG 24 hr tablet Take 1 tablet (50 mg total) by mouth daily. Take with or immediately following a meal. 90 tablet 3   oxymetazoline (AFRIN) 0.05 % nasal spray Place 1 spray into both nostrils daily as needed for congestion.     simvastatin (ZOCOR) 20 MG tablet TAKE 1 TABLET AT BEDTIME 90 tablet 3   torsemide (DEMADEX) 100 MG tablet Take 1 tablet (100 mg total) by mouth daily. 90 tablet 3   oxyCODONE-acetaminophen (PERCOCET/ROXICET) 5-325 MG tablet Take 1 tablet by mouth every 6 (six) hours as needed for severe pain. 120 tablet 0   oxyCODONE-acetaminophen (PERCOCET/ROXICET) 5-325 MG tablet Take 1 tablet by mouth  every 6 (six) hours as needed for severe pain. 120 tablet 0   nitroGLYCERIN (NITROSTAT) 0.4 MG SL tablet Place 1 tablet (0.4 mg total) under the tongue every 5 (five) minutes as needed for chest pain (Up to 3 doses). 25 tablet 1   No facility-administered medications prior to visit.    ROS: Review of Systems  Constitutional:  Negative for appetite change, fatigue and unexpected weight change.  HENT:  Negative for congestion, nosebleeds, sneezing, sore throat and trouble swallowing.   Eyes:  Negative for itching and visual disturbance.  Respiratory:  Positive for shortness of breath. Negative for cough.   Cardiovascular:  Negative for chest pain, palpitations and leg swelling.  Gastrointestinal:  Negative for abdominal distention, blood in stool, diarrhea and nausea.  Genitourinary:  Negative for frequency and hematuria.  Musculoskeletal:  Positive for arthralgias and gait problem. Negative for back pain, joint swelling and neck pain.  Skin:  Positive for color change. Negative for rash.  Neurological:  Negative for dizziness, tremors, speech difficulty and weakness.  Hematological:  Bruises/bleeds easily.  Psychiatric/Behavioral:  Positive for dysphoric mood. Negative for agitation and sleep disturbance. The patient is nervous/anxious.    Objective:  BP 130/78 (BP Location: Left Arm, Patient Position: Sitting, Cuff Size: Large)    Pulse 76    Temp 98.7 F (37.1  C) (Oral)    Ht 6' (1.829 m)    Wt 262 lb (118.8 kg)    SpO2 (!) 88%    BMI 35.53 kg/m   BP Readings from Last 3 Encounters:  09/26/21 130/78  06/21/21 (!) 134/58  05/10/21 (!) 142/85    Wt Readings from Last 3 Encounters:  09/26/21 262 lb (118.8 kg)  06/21/21 256 lb (116.1 kg)  03/21/21 261 lb 12.8 oz (118.8 kg)    Physical Exam Constitutional:      General: He is not in acute distress.    Appearance: He is well-developed. He is obese.     Comments: NAD  Eyes:     Conjunctiva/sclera: Conjunctivae normal.      Pupils: Pupils are equal, round, and reactive to light.  Neck:     Thyroid: No thyromegaly.     Vascular: No JVD.  Cardiovascular:     Rate and Rhythm: Normal rate and regular rhythm.     Heart sounds: Normal heart sounds. No murmur heard.   No friction rub. No gallop.  Pulmonary:     Effort: Pulmonary effort is normal. No respiratory distress.     Breath sounds: Normal breath sounds. No wheezing or rales.  Chest:     Chest wall: No tenderness.  Abdominal:     General: Bowel sounds are normal. There is no distension.     Palpations: Abdomen is soft. There is no mass.     Tenderness: There is abdominal tenderness. There is no guarding or rebound.  Musculoskeletal:        General: No tenderness. Normal range of motion.     Cervical back: Normal range of motion.  Lymphadenopathy:     Cervical: No cervical adenopathy.  Skin:    General: Skin is warm and dry.     Findings: No rash.  Neurological:     Mental Status: He is alert and oriented to person, place, and time.     Cranial Nerves: No cranial nerve deficit.     Motor: No abnormal muscle tone.     Coordination: Coordination abnormal.     Gait: Gait normal.     Deep Tendon Reflexes: Reflexes are normal and symmetric.  Psychiatric:        Behavior: Behavior normal.        Thought Content: Thought content normal.        Judgment: Judgment normal.  LS is tender  Lab Results  Component Value Date   WBC 6.8 05/10/2021   HGB 12.9 (L) 05/10/2021   HCT 41.0 05/10/2021   PLT 102 (L) 05/10/2021   GLUCOSE 101 (H) 05/10/2021   CHOL 108 01/01/2018   TRIG 98 01/01/2018   HDL 35 (L) 01/01/2018   LDLCALC 53 01/01/2018   ALT 14 05/10/2021   AST 20 05/10/2021   NA 143 05/10/2021   K 5.2 (H) 05/10/2021   CL 101 05/10/2021   CREATININE 0.87 05/10/2021   BUN 15 05/10/2021   CO2 33 (H) 05/10/2021   TSH 0.80 02/17/2020   PSA 0.23 09/15/2011   INR 1.0 11/28/2017   HGBA1C 5.4 09/14/2020   MICROALBUR 0.7 03/29/2016    VAS US  CAROTID  Result Date: 01/22/2021 Carotid Arterial Duplex Study Patient Name:  [name]  Date of Exam:   01/21/2021 Medical Rec #: [id number]            Accession #:    [id number] Date of Birth: [birth year]  Patient Gender: M Patient Age:   37Y Exam Location:  [facility] Procedure:      VAS US CAROTID Referring Phys: [id number] PETER C NISHAN --------------------------------------------------------------------------------  Indications:       Carotid artery disease, left endarterectomy and patient                    denies any cerebrovascular symptoms today. Risk Factors:      Hypertension, hyperlipidemia, Diabetes, past history of                    smoking, coronary artery disease. Other Factors:     Left carotid endarterectomy 2010                     CABG                    COPD. Comparison Study:  Prior carotid duplex exam on 01/22/2020 showed highest                    velocities in right proximal ICA 100/18 cm/s and left mid ICA                    76/17 cm/s, s/p left CEA. Left subclavian artery 405 cm/s. Performing Technologist: Salvadore Dom RVT, RDCS (AE), RDMS  Examination Guidelines: A complete evaluation includes B-mode imaging, spectral Doppler, color Doppler, and power Doppler as needed of all accessible portions of each vessel. Bilateral testing is considered an integral part of a complete examination. Limited examinations for reoccurring indications may be performed as noted.  Right Carotid Findings: +----------+--------+--------+--------+--------------------+--------+             PSV cm/s EDV cm/s Stenosis Plaque Description   Comments  +----------+--------+--------+--------+--------------------+--------+  CCA Prox   71       13                                               +----------+--------+--------+--------+--------------------+--------+  CCA Mid    64       15                focal and calcific             +----------+--------+--------+--------+--------------------+--------+  CCA  Distal 58       15                diffuse and calcific           +----------+--------+--------+--------+--------------------+--------+  ICA Prox   103      31       1-39%    diffuse and calcific           +----------+--------+--------+--------+--------------------+--------+  ICA Mid    101      28                                               +----------+--------+--------+--------+--------------------+--------+  ICA Distal 76       21                                               +----------+--------+--------+--------+--------------------+--------+  ECA        186      19       >50%     diffuse and calcific           +----------+--------+--------+--------+--------------------+--------+ +----------+--------+-------+----------------+-------------------+             PSV cm/s EDV cms Describe         Arm Pressure (mmHG)  +----------+--------+-------+----------------+-------------------+  Subclavian 165              Multiphasic, WNL 147                  +----------+--------+-------+----------------+-------------------+ +---------+--------+--+--------+-+---------+  Vertebral PSV cm/s 35 EDV cm/s 8 Antegrade  +---------+--------+--+--------+-+---------+  Left Carotid Findings: +----------+--------+--------+--------+--------------------+-------------------+             PSV cm/s EDV cm/s Stenosis Plaque Description   Comments             +----------+--------+--------+--------+--------------------+-------------------+  CCA Prox   136      17                                     spectral broadening                                                              noted                +----------+--------+--------+--------+--------------------+-------------------+  CCA Mid    56       13                diffuse and calcific                      +----------+--------+--------+--------+--------------------+-------------------+  CCA Distal 54       13                                     s/p CEA               +----------+--------+--------+--------+--------------------+-------------------+  ICA Prox   50       18                focal and calcific   s/p CEA              +----------+--------+--------+--------+--------------------+-------------------+  ICA Mid    89       20       1-39%                                              +----------+--------+--------+--------+--------------------+-------------------+  ICA Distal 74       27                                                          +----------+--------+--------+--------+--------------------+-------------------+  ECA  317      21       >50%                                               +----------+--------+--------+--------+--------------------+-------------------+ +----------+--------+--------+--------+-------------------+             PSV cm/s EDV cm/s Describe Arm Pressure (mmHG)  +----------+--------+--------+--------+-------------------+  Subclavian 410               Stenotic 147                  +----------+--------+--------+--------+-------------------+ +---------+--------+--+--------+--+---------+  Vertebral PSV cm/s 55 EDV cm/s 14 Antegrade  +---------+--------+--+--------+--+---------+   Summary: Right Carotid: Velocities in the right ICA are consistent with a stable 1-39%                stenosis. Non-hemodynamically significant plaque <50% noted in                the CCA. The ECA appears >50% stenosed. Left Carotid: Velocities in the left ICA are consistent with a stable 1-39%               stenosis. Non-hemodynamically significant plaque <50% noted in the               CCA. The ECA appears >50% stenosed. S/p CEA. Vertebrals:  Bilateral vertebral arteries demonstrate antegrade flow. Subclavians: Left subclavian artery was stenotic. Normal flow hemodynamics were              seen in the right subclavian artery. *See table(s) above for measurements and observations. Suggest follow up study in 12 months. Electronically signed by Larae Grooms MD on  01/22/2021 at 8:35:49 PM.    Final     Assessment & Plan:   Problem List Items Addressed This Visit     Chronic diastolic CHF (congestive heart failure) (HCC) (Chronic)    On O2 at home      Relevant Orders   Comprehensive metabolic panel   CBC with Differential/Platelet   Hemoglobin A1c   TSH   Diabetes mellitus type 2, controlled (HCC) (Chronic)    On diet      Relevant Orders   Comprehensive metabolic panel   CBC with Differential/Platelet   Hemoglobin A1c   TSH   Anxiety disorder    Worse due to pain. Treat pain Cont on Clonazepam prn Not to take w/pain meds  Potential benefits of a long term benzodiazepines  use as well as potential risks  and complications were explained to the patient and were aknowledged.      COPD mixed type ([place])    C/o Anoro ellipta was too $$$: HHN would be an option      Relevant Orders   Comprehensive metabolic panel   CBC with Differential/Platelet   Hemoglobin A1c   TSH   Low back pain radiating to both legs    C/o developing Oxy tolerance: pain is 5/10 all the time or more lately.Marland KitchenMarland KitchenWill increase the dose of Percocet prn Not to take w/Clonazepam  Potential benefits of a long term opioids use as well as potential risks (i.e. addiction risk, apnea etc) and complications (i.e. Somnolence, constipation and others) were explained to the patient and were aknowledged.      Relevant Medications   oxyCODONE-acetaminophen (PERCOCET) 7.5-325 MG tablet   oxyCODONE-acetaminophen (PERCOCET) 7.5-325 MG tablet  Other Relevant Orders   Comprehensive metabolic panel   CBC with Differential/Platelet   Hemoglobin A1c   TSH      Meds ordered this encounter  Medications   oxyCODONE-acetaminophen (PERCOCET) 7.5-325 MG tablet    Sig: Take 1 tablet by mouth every 6 (six) hours as needed for severe pain.    Dispense:  120 tablet    Refill:  0    Please fill on or after 10/14/21   oxyCODONE-acetaminophen (PERCOCET) 7.5-325 MG tablet    Sig: Take 1  tablet by mouth every 6 (six) hours as needed for severe pain.    Dispense:  120 tablet    Refill:  0    Please fill on or after 11/13/21      Follow-up: Return in about 2 months (around 11/24/2021) for Wellness Exam.  Walker Kehr, MD

## 2021-09-26 NOTE — Assessment & Plan Note (Signed)
On O2 at home

## 2021-09-26 NOTE — Assessment & Plan Note (Signed)
C/o developing Oxy tolerance: pain is 5/10 all the time or more lately.Marland KitchenMarland KitchenWill increase the dose of Percocet prn Not to take w/Clonazepam  Potential benefits of a long term opioids use as well as potential risks (i.e. addiction risk, apnea etc) and complications (i.e. Somnolence, constipation and others) were explained to the patient and were aknowledged.

## 2021-09-26 NOTE — Assessment & Plan Note (Addendum)
Worse due to pain. Treat pain Cont on Clonazepam prn Not to take w/pain meds  Potential benefits of a long term benzodiazepines  use as well as potential risks  and complications were explained to the patient and were aknowledged.

## 2021-09-26 NOTE — Assessment & Plan Note (Addendum)
On diet Check A1c 

## 2021-09-26 NOTE — Assessment & Plan Note (Signed)
C/o Anoro ellipta was too $$$: HHN would be an option

## 2021-09-27 LAB — COMPREHENSIVE METABOLIC PANEL
ALT: 12 U/L (ref 0–53)
AST: 19 U/L (ref 0–37)
Albumin: 4.7 g/dL (ref 3.5–5.2)
Alkaline Phosphatase: 77 U/L (ref 39–117)
BUN: 25 mg/dL — ABNORMAL HIGH (ref 6–23)
CO2: 37 mEq/L — ABNORMAL HIGH (ref 19–32)
Calcium: 9.7 mg/dL (ref 8.4–10.5)
Chloride: 100 mEq/L (ref 96–112)
Creatinine, Ser: 0.95 mg/dL (ref 0.40–1.50)
GFR: 82.77 mL/min (ref >60.00)
Glucose, Bld: 100 mg/dL — ABNORMAL HIGH (ref 70–99)
Potassium: 4.9 mEq/L (ref 3.5–5.1)
Sodium: 143 mEq/L (ref 135–145)
Total Bilirubin: 0.8 mg/dL (ref 0.2–1.2)
Total Protein: 7.5 g/dL (ref 6.0–8.3)

## 2021-10-13 NOTE — Progress Notes (Unsigned)
CARDIOLOGY OFFICE NOTE  Date:  10/13/2021    [name] Date of Birth: 07/03/1956 Medical Record #[id number]  PCP:  [name], MD  Cardiologist:  Johnsie Cancel    History of Present Illness: [name] is a 66 y.o. male  With a history of obesity, known CAD with prior CABG in 2005, left CEA 2009, HTN, HLD, GERD, OSA, COPD and follicular lymphoma diagnosed in 2016.    Cath 09/29/11 with patent LIMA to LAD. Received DES to SVG Ramus and DES to SVG PDA at that time.   TTE 11/23/17 with normal EF at 55 to 93%, normal diastolic parameters and no significant valve disease. Saw me 11/28/17 - fatigued, some dyspnea and right sided chest pain endorsed. 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 s/p PTCA and stenting of the saphenous vein graft to PDA and saphenous vein graft to obtuse marginal by Dr. Burt Knack 09/29/11. Normal EF at cath 09/2011   Depression    Diabetes mellitus without complication (HCC)    borderline    Dysrhythmia    fluttering   Edema    GERD (gastroesophageal reflux disease)     Heart murmur    Heparin induced thrombocytopenia    History of home oxygen therapy    2 liters at night prn   Hyperlipidemia    Hypertension    Lymphoma ([place]) 12/21/2014   Myocardial infarction ([place])    Obesity    Shortness of breath dyspnea    with exertion    Sleep apnea    "used to"      could not use 2006    Past Surgical History:  Procedure Laterality Date   BASAL CELL CARCINOMA EXCISION  2000's   nose   CARDIAC CATHETERIZATION  12/07/2017   CAROTID ENDARTERECTOMY  03/2008   left   CORONARY ANGIOPLASTY WITH STENT PLACEMENT  09/29/11   "2"   CORONARY ARTERY BYPASS GRAFT  2005   CABG X3   CORONARY STENT INTERVENTION  12/07/2017   CORONARY STENT INTERVENTION N/A 12/07/2017   Procedure: CORONARY STENT INTERVENTION;  Surgeon: Burnell Blanks, MD;  Location: [facility];  Service: Cardiovascular;  Laterality: N/A;   evacuation of hematoma  03/2008   left neck; S/P endarterectomy; "cause heparin clotted it up"   I & D EXTREMITY Left 02/11/2015   Procedure: IRRIGATION AND DEBRIDEMENT LEFT  LONG FINGER;  Surgeon: Leanora Cover, MD;  Location: [facility];  Service: Orthopedics;  Laterality: Left;  I and D Left Long Finger   IR REMOVAL TUN ACCESS W/ PORT W/O FL MOD SED  05/18/2017   MASS EXCISION Left 12/11/2014   Procedure: EXCISIONAL BIOPSY OF LEFT SUPRA CLAVICULAR NECK MASS;  Surgeon: Jerrell Belfast, MD;  Location: [facility];  Service: ENT;  Laterality: Left;   PERCUTANEOUS CORONARY STENT INTERVENTION (PCI-S) N/A 09/29/2011   Procedure: PERCUTANEOUS CORONARY STENT INTERVENTION (PCI-S);  Surgeon: Sherren Mocha, MD;  [facility];  Service: Cardiovascular;  Laterality: N/A;   PORTACATH PLACEMENT  2016   RIGHT/LEFT HEART CATH AND CORONARY/GRAFT ANGIOGRAPHY N/A 12/07/2017   Procedure: RIGHT/LEFT HEART CATH AND CORONARY/GRAFT ANGIOGRAPHY;  Surgeon: Burnell Blanks, MD;  Location: [facility];  Service: Cardiovascular;  Laterality: N/A;    SUPERFICIAL LYMPH NODE BIOPSY / EXCISION Left      Medications: No outpatient medications have been marked as taking for the 10/21/21 encounter (Appointment) with [name], MD.     Allergies: Allergies  Allergen Reactions   Bendamustine Hcl Rash    See hospital notes from 01/12/15   Heparin Other (See Comments)    Opposite reaction Heparin induced thrombocytopenia Other reaction(s): Heparin-Induced Thrombocytopenia   Tape Rash    Social History: The patient  reports that he quit smoking about 3 years ago. His smoking use included cigarettes. He has a 82.00 pack-year smoking history. He has quit using smokeless tobacco.  His smokeless tobacco use included chew. He reports current drug use. Drug: Marijuana. He reports that he does not drink alcohol.   Family History: The patient's family history includes Bone cancer in his mother; Heart disease in his father.   Review of Systems: Please see the history of present illness.   Otherwise, the review of systems is positive for none.   All other systems are reviewed and negative.   Physical Exam: VS:  There were no vitals taken for this visit. Marland Kitchen  BMI There is no height or weight on file to calculate BMI.  Wt Readings from Last 3 Encounters:  09/26/21 262 lb (118.8 kg)  06/21/21 256 lb (116.1 kg)  03/21/21 261 lb 12.8 oz (118.8 kg)    Affect appropriate Obese white male  HEENT: normal Neck supple with no adenopathy JVP normal no bruits no thyromegaly post left CEA  Lungs mild expiratory wheezing  Heart:  S1/S2 SEM radiates to both carotids no diastolic  murmur, no rub, gallop or click PMI normal Abdomen: benighn, BS positve, no tenderness, no AAA no bruit.  No HSM or HJR Distal pulses intact with no bruits Plus 2 edema with stasis changes  Neuro non-focal Skin warm and dry No muscular weakness Previous tunneled catheter removed     LABORATORY DATA:  EKG:  SR RBBB no acute changes 10/13/2021   Lab Results   Component Value Date   WBC 6.8 09/26/2021   HGB 13.2 09/26/2021   HCT 39.9 09/26/2021   PLT 88.0 (L) 09/26/2021   GLUCOSE 100 (H) 09/26/2021   CHOL 108 01/01/2018   TRIG 98 01/01/2018   HDL 35 (L) 01/01/2018   LDLCALC 53 01/01/2018   ALT 12 09/26/2021   AST 19 09/26/2021   NA 143 09/26/2021   K 4.9 09/26/2021   CL 100 09/26/2021   CREATININE 0.95 09/26/2021   BUN 25 (H) 09/26/2021   CO2 37 (H) 09/26/2021   TSH 0.87 09/26/2021  PSA 0.23 09/15/2011   INR 1.0 11/28/2017   HGBA1C 5.2 09/26/2021   MICROALBUR 0.7 03/29/2016     BNP (last 3 results) No results for input(s): BNP in the last 8760 hours.  ProBNP (last 3 results) No results for input(s): PROBNP in the last 8760 hours.   Other Studies Reviewed Today:  Procedures   CORONARY STENT INTERVENTION 12/07/2017  RIGHT/LEFT HEART CATH AND CORONARY/GRAFT ANGIOGRAPHY  Conclusion     Prox RCA lesion is 100% stenosed. SVG graft was visualized by angiography and is normal in caliber. Prox Graft lesion is 99% stenosed. A drug-eluting stent was successfully placed using a STENT RESOLUTE ONYX 4.0X22. Post intervention, there is a 0% residual stenosis. Mid LM to Ost LAD lesion is 50% stenosed. Ost 1st Mrg lesion is 100% stenosed. Origin to Prox Graft lesion is 100% stenosed. Ost LAD to Prox LAD lesion is 90% stenosed. Hemodynamic findings consistent with moderate pulmonary hypertension.   1. Severe triple vessel CAD s/p 3V CABG with 2/3 patent bypass grafts 2. The left main has a distal 50% stenosis.  3. The LAD has severe proximal stenosis. The mid and distal LAD fills from the patent LIMA graft.  4. The Circumflex has mild diffuse disease. The intermediate is a large caliber vessel with proximal occlusion. The vein graft to the intermediate branch is occluded in the proximal stent.  5. The RCA is occluded in the proximal segment. The distal vessel fills from the patent vein graft. The stent in the proximal segment of  the vein graft has severe in stent restenosis.  6. Elevated filling pressures   Recommendations: Will continue DAPT with ASA and Plavix. Continue beta blocker and statin. He would benefit from additional diuresis.      Echo Study Conclusions April 2019   - Left ventricle: The cavity size was mildly dilated. There was   moderate concentric hypertrophy. Systolic function was normal.   The estimated ejection fraction was in the range of 55% to 60%.   Wall motion was normal; there were no regional wall motion   abnormalities. Left ventricular diastolic function parameters   were normal. - Aortic valve: There was trivial regurgitation. - Aorta: Aortic root dimension: 40 mm (ED). - Aortic root: The aortic root was mildly dilated. - Left atrium: The atrium was moderately dilated. - Right ventricle: The cavity size was mildly dilated. Wall   thickness was normal. Systolic function was mildly reduced.     Result Notes for VAS US CAROTID   Notes recorded by Michaelyn Barter, RN on 12/31/2017 at 2:54 PM EDT Patient aware of carotid results. Per Dr. Johnsie Cancel, plaque no stenosis f/u carotid duplex in 2 years. Patient verbalized understanding. Will send copy to patient's PCP. ------      Assessment/Plan:  1. CAD with remote CABG in 2005, stent to SVG to IM and SVG to PDA in 2013 - 12/07/17 s/p PCI with DES to SVG to RCA - remains on DAPT. No angina stable      2. HTN - Well controlled.  Continue current medications and low sodium Dash type diet.    3. HLD - on statin -   LDL 53 01/01/18  Repeat labs ordered   4. Lymphoma - remission Dr Alvy Bimler to follow PLTls/WBC port a cath removed no routine imaging Low PLT;s ? Fatty liver most recent 88 followed by oncology    5. Carotid stenosis - plaque no stenosis on duplex 01/21/21 History of left CEA F/U duplex June 2023  6. Murmur - Echo 11/23/17  with no significant valve disease  7. COPD congratulated him on smoking cessation f/u pulmonary    8. Obesity discussed portion size and low carb diet with exercise    Current medicines are reviewed with the patient today.  The patient does not have concerns regarding medicines other than what has been noted above.  The following changes have been made:  See above.  Labs/ tests ordered today include:   Lipid and Liver  Carotids June 2023   No orders of the defined types were placed in this encounter.   Disposition:   FU in a year   Jenkins Rouge

## 2021-10-21 ENCOUNTER — Ambulatory Visit: Payer: Medicare Other | Admitting: Cardiovascular Disease

## 2021-10-24 ENCOUNTER — Other Ambulatory Visit: Payer: Self-pay | Admitting: Internal Medicine

## 2021-10-31 ENCOUNTER — Ambulatory Visit (INDEPENDENT_AMBULATORY_CARE_PROVIDER_SITE_OTHER): Payer: Medicare Other | Admitting: *Deleted

## 2021-10-31 DIAGNOSIS — I10 Essential (primary) hypertension: Secondary | ICD-10-CM

## 2021-10-31 DIAGNOSIS — E785 Hyperlipidemia, unspecified: Secondary | ICD-10-CM

## 2021-10-31 DIAGNOSIS — I5032 Chronic diastolic (congestive) heart failure: Secondary | ICD-10-CM

## 2021-10-31 NOTE — Patient Instructions (Addendum)
Visit Information ? ?[name], thank you for taking time to talk with me today. Please don't hesitate to contact me if I can be of assistance to you before our next scheduled telephone appointment ? ?As you requested, I have made Dr. Alain Marion aware that you would like for him to prescribe a rescue inhaler for you ? ?Below are the goals we discussed today:  ?Patient Self-Care Activities: ?Patient [name] will: ?Take medications as prescribed   ?Attend all scheduled provider appointments  ?Call pharmacy for medication refills  ?Call provider office for new concerns or questions  ?Continue to follow a heart healthy, low salt, low cholesterol diet ?Stay as active as possible around chronic pain considerations ?Keep up the great work preventing falls ? ?Our next scheduled telephone follow up visit/ appointment is scheduled on: Monday, May 01, 2022 at 9:00 am- This is a PHONE CALL appointment ? ?If you need to cancel or re-schedule our visit, please call [phone] and our care guide team will be happy to assist you. ?  ?I look forward to hearing about your progress. ?  ?Oneta Rack, RN, BSN, CCRN Alumnus ?[place] ?([phone]: direct office ? ?If you are experiencing a Mental Health or [place] or need someone to talk to, please  ?call the Suicide and Crisis Lifeline: 988 ?call the [place] National Suicide Prevention Lifeline: 279-872-4086 or TTY: (567)798-5058 TTY 5318775600) to talk to a trained counselor ?call 1-800-273-TALK (toll free, 24 hour hotline) ?go to [facility] [address] [place] [phone]) ?call 911  ? ?The patient verbalized understanding of instructions, educational materials, and care plan provided today and agreed to receive a mailed copy of patient instructions, educational materials, and care plan ? ?Living With Heart Failure ?Heart failure is a long-term (chronic) condition in  which the heart cannot pump enough blood through the body. When this happens, parts of the body do not get the blood and oxygen they need. ?There is no cure for heart failure at this time, so it is important for you to take good care of yourself and follow the treatment plan you set with your health care provider. If you are living with heart failure, there are ways to help you manage the disease. ?How to manage lifestyle changes ?Living with heart failure requires you to make changes in your life. Your health care team will teach you about the changes you need to make in order to relieve your symptoms and lower your risk of going to the hospital. Work with your health care provider to develop a treatment plan that works for you. ?Activity ?Ask your health care provider about attending cardiac rehabilitation. These programs include aerobic physical activity, which provides many benefits for your heart. ?If no cardiac rehabilitation program is available, ask your health care provider what aerobic exercises are safe for you to do. ?Return to your normal activities as told by your health care provider. Ask your health care provider what activities are safe for you. ?Pace your daily activities and allow time for rest as needed. ?Managing stress ?It is normal to have many emotions about your diagnosis, such as fear, sadness, anger, and loss. If you feel any of these emotions and need help coping, contact your health care provider. Here are some ways to help yourself manage these emotions: ?Talk to friends and family members about your condition. They can give you support and guidance. Explain your symptoms to them and,  if comfortable, invite them to attend appointments or rehabilitation with you. ?Join a support group for people with chronic heart failure. Talking with other people who have the same symptoms may give you new ways of coping with your disease and your emotions. ?Accept help from others. Do not be ashamed  if you need help with certain tasks. ?Use stress management techniques, such as meditation, breathing exercises, or listening to relaxing music. ?Conditions such as depression and anxiety are common in persons with heart failure. Pay attention to changes in your mood, emotions, and stress levels. Tell your health care provider if you have any of the following symptoms: ?Trouble sleeping or a change in your sleeping patterns. ?Feeling sad, down, or depressed more often than not, every day for more than 2 weeks. ?Losing interest in activities you normally enjoy. ?Feeling irritable or crying for no reason. ?Finding yourself worrying about the future often. ?Work ?You may need to develop a plan with your health care provider if heart failure interferes with your ability to work. This may include: ?Reducing your work hours. ?Finding functions that are less active or require less effort. ?Planning rest periods during your work hours. ?Travel ?Talk with your health care provider if you plan to travel. There may be circumstances in which your health care provider recommends that you do not travel or that you delay travel until your condition is under control. ?When you travel, bring your medicine and a list of your medicines. If you are traveling by air, keep your medicines with you in a carry-on bag. ?Consider finding a medical facility in the area you will be traveling to and determine what your health insurance will cover. ?If you will be traveling by public transportation (airplane, train, bus), contact the company prior to traveling if you have special needs. This may include needs related to diet, oxygen, a wheelchair, a seating request, or help with luggage. ?If you use oxygen, make sure to bring enough oxygen with you. ?If you have a battery-powered device, bring a fully charged extra battery with you. ?If you have a device, bring a note from your health care provider and inform all security screening personnel that  you have the device. You may need to go through special screening for safety. ?Sexual activity ? ?Ask your health care provider when it is safe for you to resume sexual activity. ?You may need to start slowly and gradually increase intimacy. You can increase intimacy by doing such things as caressing, touching, and holding each other. ?Get regular exercise as told by your health care provider. This can benefit your sex life by building strength and endurance. ?Sleep ?If your condition interferes with your sleep, find ways to improve your sleep quality, such as: ?Sleep lying on your side, or sleep with your head elevated by raising the head of your bed or using multiple pillows. ?Ask your health care provider about screening for sleep apnea. ?Try to go to sleep and wake up at the same times every day. ?Sleep in a dark, cool room. ?Do not do any physical activity or eating for a few hours before bedtime. ?Plan rest periods during the day, but do not take long naps during the day. ? ?Where to find support ?Consider talking with: ?Family members. ?Close friends. ?A mental health professional or therapist. ?A member of your church, faith, or community group. ?Other sources of support include: ?Local support groups. Ask your health care provider about groups near you. ?Online support groups, such as  those found through the American Heart Association: supportnetwork.heart.org ?Local home care agencies, community agencies, or social agencies. ?A palliative care specialist. Palliative care can help you manage symptoms, promote comfort, improve quality of life, and maintain dignity. ?Where to find more information ?American Heart Association: heart.org ?National Heart, Lung, and Blood Institute: https://www.hartman-hill.biz/ ?Centers for Disease Control and Prevention: https://www.reeves.com/ ?Heart Failure Society of [place]: SolutionApps.it ?Contact a health care provider if: ?You have a rapid weight gain. ?You have increasing shortness of  breath that is unusual for you. ?You are unable to participate in your usual physical activities. ?You tire easily. ?You have difficulty sleeping, such as: ?You wake up feeling short of breath. ?You hav

## 2021-10-31 NOTE — Chronic Care Management (AMB) (Addendum)
?Chronic Care Management  ? ?CCM RN Visit Note ? ?10/31/2021 ?Name: [name] MRN: [id number] DOB: 1956/07/24 ? ?Subjective: ?[name] is a 66 y.o. year old male who is a primary care patient of Plotnikov, [name], MD. The care management team was consulted for assistance with disease management and care coordination needs.   ? ?Engaged with patient by telephone for follow up visit in response to provider referral for case management and/or care coordination services.  ? ?Consent to Services:  ?The patient was given information about Chronic Care Management services, agreed to services, and gave verbal consent prior to initiation of services.  Please see initial visit note for detailed documentation.  ?Patient agreed to services and verbal consent obtained.  ? ?Assessment: Review of patient past medical history, allergies, medications, health status, including review of consultants reports, laboratory and other test data, was performed as part of comprehensive evaluation and provision of chronic care management services.  ? ?SDOH (Social Determinants of Health) assessments and interventions performed:  ?SDOH Interventions   ? ?Flowsheet Row Most Recent Value  ?SDOH Interventions   ?Food Insecurity Interventions Intervention Not Indicated  [continues to deny food insecurity]  ?Housing Interventions Intervention Not Indicated  ?Transportation Interventions Intervention Not Indicated  [Reports continues to drive self]  ? ?  ?      CCM Care Plan ? ?Allergies  ?Allergen Reactions  ? Bendamustine Hcl Rash  ?  See hospital notes from 01/12/15  ? Heparin Other (See Comments)  ?  Opposite reaction ?Heparin induced thrombocytopenia ?Other reaction(s): Heparin-Induced Thrombocytopenia  ? Tape Rash  ? ?Outpatient Encounter Medications as of 10/31/2021  ?Medication Sig Note  ? albuterol (PROVENTIL) (2.5 MG/3ML) 0.083% nebulizer solution INHALE 1 VIAL VIA NEBULIZER EVERY DAY AS NEEDED FOR WHEEZING/SHORTNESS OF  BREATH   ? ANORO ELLIPTA 62.5-25 MCG/INH AEPB Inhale 1 puff into the lungs daily as needed. For wheezing & SOB   ? aspirin EC 81 MG tablet Take 81 mg by mouth every evening.  12/23/2014: .  ? azithromycin (ZITHROMAX Z-PAK) 250 MG tablet As directed   ? clonazePAM (KLONOPIN) 0.5 MG tablet TAKE 1 TABLET BY MOUTH TWICE A DAY AS NEEDED   ? clopidogrel (PLAVIX) 75 MG tablet TAKE 1 TABLET BY MOUTH EVERY DAY WITH BREAKFAST   ? KLOR-CON M10 10 MEQ tablet Take 10 mEq by mouth daily. Take 1 by mouth daily   ? methylPREDNISolone (MEDROL DOSEPAK) 4 MG TBPK tablet As directed   ? metolazone (ZAROXOLYN) 5 MG tablet TAKE 1 TABLET BY MOUTH EVERY DAY 05/06/2021: 05/06/21: Reports no longer taking every day  ? metoprolol succinate (TOPROL-XL) 50 MG 24 hr tablet Take 1 tablet (50 mg total) by mouth daily. Take with or immediately following a meal.   ? nitroGLYCERIN (NITROSTAT) 0.4 MG SL tablet Place 1 tablet (0.4 mg total) under the tongue every 5 (five) minutes as needed for chest pain (Up to 3 doses).   ? oxyCODONE-acetaminophen (PERCOCET) 7.5-325 MG tablet Take 1 tablet by mouth every 6 (six) hours as needed for severe pain.   ? oxyCODONE-acetaminophen (PERCOCET) 7.5-325 MG tablet Take 1 tablet by mouth every 6 (six) hours as needed for severe pain.   ? oxymetazoline (AFRIN) 0.05 % nasal spray Place 1 spray into both nostrils daily as needed for congestion.   ? simvastatin (ZOCOR) 20 MG tablet TAKE 1 TABLET AT BEDTIME   ? torsemide (DEMADEX) 100 MG tablet Take 1 tablet (100 mg total) by mouth daily. 05/06/2021: 05/06/21:  Patient reports taking 2-4 times per week  ? ?No facility-administered encounter medications on file as of 10/31/2021.  ? ?Patient Active Problem List  ? Diagnosis Date Noted  ? History of COVID-19 06/21/2021  ? Aortic atherosclerosis ([place]) 12/20/2020  ? Peripheral neuropathy 06/22/2020  ? Leukocytosis 04/27/2020  ? Weight loss 02/17/2020  ? CAD (coronary artery disease) 12/14/2017  ? CAD S/P percutaneous coronary  angioplasty 12/08/2017  ? Unstable angina (HCC)   ? Acute respiratory failure with hypoxia ([place]) 11/15/2017  ? COPD with acute exacerbation ([place]) 11/15/2017  ? Port catheter in place 01/12/2017  ? Skin lesion of back 04/28/2016  ? Low back pain radiating to both legs 09/28/2015  ? Boil of upper extremity 08/20/2015  ? Bilateral leg ulcer ([place]) 06/25/2015  ? URI (upper respiratory infection) 04/26/2015  ? Rash 04/26/2015  ? Basal cell carcinoma of back 04/08/2015  ? Well adult exam 04/01/2015  ? Cellulitis of hand, left 02/11/2015  ? Vasculitis ([place])   ? Cellulitis of right lower extremity   ? Erythroderma   ? Purpura ([place])   ? [facility] spotted fever)   ? Chronic diastolic CHF (congestive heart failure) ([place])   ? Thrombocytopenia ([place])   ? Hx of CABG   ? Bilateral lower extremity edema 01/12/2015  ? Hypersensitivity reaction 01/02/2015  ? Asymptomatic hyperuricemia 12/30/2014  ? 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 Mass of neck 12/11/2014  ?  Class: Chronic  ? Morbid obesity ([place]) 12/11/2014  ? Diabetes mellitus type 2, controlled ([place]) 12/11/2014  ? Depression 12/11/2014  ? Neoplasm of uncertain behavior of skin 09/25/2014  ? Neck mass 07/28/2014  ? Insomnia 03/25/2014  ? Erectile dysfunction 09/22/2013  ? Chronic venous insufficiency 02/25/2013  ? 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 ([place]) 02/25/2013  ? Anxiety disorder 02/25/2013  ? Murmur 09/03/2012  ? Tobacco abuse 12/02/2010  ? COPD mixed type ([place]) 06/29/2009  ? G E R D 06/29/2009  ? ENLARGEMENT OF LYMPH NODES 06/29/2009  ? HEPARIN-INDUCED THROMBOCYTOPENIA 02/11/2009  ? Edema 02/11/2009  ? Carotid disease, bilateral ([place]) 02/11/2009  ? Elevated lipids 05/23/2007  ? Essential hypertension 05/23/2007  ? Sleep apnea 05/23/2007  ? ?Conditions to be addressed/monitored:  CHF, HTN, and HLD ? ?Care Plan : RN Care Manager Plan of Care  ?Updates made by Knox Royalty, RN since 10/31/2021 12:00 AM  ?  ? ?Problem: Chronic Disease  Management Needs   ?Priority: Medium  ?  ? ?Long-Range Goal: Ongoing adherence to established plan of care for long term chronic disease management   ?Start Date: 05/06/2021  ?Expected End Date: 05/06/2022  ?Priority: Medium  ?Note:   ?Current Barriers:  ?Chronic Disease Management support and education needs related to HTN and HLD ?Chronic pain- limits ability to exercise/ stay active ? ?RNCM Clinical Goal(s):  ?Patient will demonstrate ongoing health management independence for HTN/ HLD/ CAD  through collaboration with RN Care manager, provider, and care team.  ? ?Interventions: ?1:1 collaboration with primary care provider regarding development and update of comprehensive plan of care as evidenced by provider attestation and co-signature ?Inter-disciplinary care team collaboration (see longitudinal plan of care) ?Evaluation of current treatment plan related to  self management and patient's adherence to plan as established by provider ?CCM RN CM Initial assessment completed 05/06/21 ?10/31/21: ?Review of patient status, including review of consultants reports, relevant laboratory and other test results, and medications completed ?SDOH updated: no new/ unmet concerns identified ?Pain assessment updated: denies acute  pain; states, "it's not usually so bad when I first get out of bed;" continues to report ongoing intermittent chronic lower back/ bilateral lower extremity pain; reports medication "works somewhat" in managing pain ?Falls assessment updated: he continues to deny new/ recent falls x 12 months- continues using cane as/ if needed;  positive reinforcement provided with encouragement to continue efforts at fall prevention; previously provided education around fall risks/ prevention reinforced ?Reviewed last PCP provider office visit 06/21/21 with patient: he denies questions, states "doing better" after having had COVID ?Medications discussed: he reports continues to independently self-manage and denies current  concerns/ issues/ questions around medications; endorses adherence to taking all medications as prescribed ?States that he continues using Anoro Ellipta maintenance inhaler, but it is expensive ?Reports h

## 2021-11-11 DIAGNOSIS — I110 Hypertensive heart disease with heart failure: Secondary | ICD-10-CM

## 2021-11-11 DIAGNOSIS — I5032 Chronic diastolic (congestive) heart failure: Secondary | ICD-10-CM | POA: Diagnosis not present

## 2021-11-11 DIAGNOSIS — E785 Hyperlipidemia, unspecified: Secondary | ICD-10-CM | POA: Diagnosis not present

## 2021-11-24 ENCOUNTER — Encounter: Payer: Self-pay | Admitting: Internal Medicine

## 2021-11-24 ENCOUNTER — Ambulatory Visit (INDEPENDENT_AMBULATORY_CARE_PROVIDER_SITE_OTHER): Payer: Medicare Other | Admitting: Internal Medicine

## 2021-11-24 DIAGNOSIS — I5032 Chronic diastolic (congestive) heart failure: Secondary | ICD-10-CM

## 2021-11-24 DIAGNOSIS — I2583 Coronary atherosclerosis due to lipid rich plaque: Secondary | ICD-10-CM

## 2021-11-24 DIAGNOSIS — M545 Low back pain, unspecified: Secondary | ICD-10-CM

## 2021-11-24 DIAGNOSIS — F413 Other mixed anxiety disorders: Secondary | ICD-10-CM

## 2021-11-24 DIAGNOSIS — I2510 Atherosclerotic heart disease of native coronary artery without angina pectoris: Secondary | ICD-10-CM | POA: Diagnosis not present

## 2021-11-24 DIAGNOSIS — M79605 Pain in left leg: Secondary | ICD-10-CM

## 2021-11-24 DIAGNOSIS — M79604 Pain in right leg: Secondary | ICD-10-CM

## 2021-11-24 MED ORDER — OXYCODONE-ACETAMINOPHEN 7.5-325 MG PO TABS: 1.0000 | ORAL_TABLET | Freq: Four times a day (QID) | ORAL | 0 refills | Status: DC | PRN

## 2021-11-24 MED ORDER — CLONAZEPAM 0.5 MG PO TABS: ORAL_TABLET | ORAL | 3 refills | Status: DC

## 2021-11-24 MED ORDER — SIMVASTATIN 20 MG PO TABS
20.0000 mg | ORAL_TABLET | Freq: Every day | ORAL | 3 refills | Status: DC
Start: 2021-11-24 — End: 2023-02-12

## 2021-11-24 MED ORDER — METOPROLOL SUCCINATE ER 50 MG PO TB24
50.0000 mg | ORAL_TABLET | Freq: Every day | ORAL | 3 refills | Status: DC
Start: 2021-11-24 — End: 2022-09-22

## 2021-11-24 NOTE — Patient Instructions (Signed)
Blue-Emu cream -- use 2-3 times a day ? ?

## 2021-11-24 NOTE — Progress Notes (Signed)
? ?Subjective:  ?Patient ID: [name], male    DOB: 1955/12/20  Age: 66 y.o. MRN: [id number] ? ?CC: No chief complaint on file. ? ? ?HPI ?[name] presents for chronic low back pain, COPD, hypertension.  He lost weight. ?Outpatient Medications Prior to Visit  ?Medication Sig Dispense Refill  ? albuterol (PROVENTIL) (2.5 MG/3ML) 0.083% nebulizer solution INHALE 1 VIAL VIA NEBULIZER EVERY DAY AS NEEDED FOR WHEEZING/SHORTNESS OF BREATH 75 mL 5  ? ANORO ELLIPTA 62.5-25 MCG/INH AEPB Inhale 1 puff into the lungs daily as needed. For wheezing & SOB 180 each 3  ? aspirin EC 81 MG tablet Take 81 mg by mouth every evening.     ? clopidogrel (PLAVIX) 75 MG tablet TAKE 1 TABLET BY MOUTH EVERY DAY WITH BREAKFAST 90 tablet 3  ? KLOR-CON M10 10 MEQ tablet Take 10 mEq by mouth daily. Take 1 by mouth daily    ? metolazone (ZAROXOLYN) 5 MG tablet TAKE 1 TABLET BY MOUTH EVERY DAY 30 tablet 11  ? nitroGLYCERIN (NITROSTAT) 0.4 MG SL tablet Place 1 tablet (0.4 mg total) under the tongue every 5 (five) minutes as needed for chest pain (Up to 3 doses). 25 tablet 1  ? oxymetazoline (AFRIN) 0.05 % nasal spray Place 1 spray into both nostrils daily as needed for congestion.    ? torsemide (DEMADEX) 100 MG tablet Take 1 tablet (100 mg total) by mouth daily. 90 tablet 3  ? azithromycin (ZITHROMAX Z-PAK) 250 MG tablet As directed 6 tablet 0  ? clonazePAM (KLONOPIN) 0.5 MG tablet TAKE 1 TABLET BY MOUTH TWICE A DAY AS NEEDED 60 tablet 3  ? methylPREDNISolone (MEDROL DOSEPAK) 4 MG TBPK tablet As directed 21 tablet 0  ? metoprolol succinate (TOPROL-XL) 50 MG 24 hr tablet Take 1 tablet (50 mg total) by mouth daily. Take with or immediately following a meal. 90 tablet 3  ? oxyCODONE-acetaminophen (PERCOCET) 7.5-325 MG tablet Take 1 tablet by mouth every 6 (six) hours as needed for severe pain. 120 tablet 0  ? oxyCODONE-acetaminophen (PERCOCET) 7.5-325 MG tablet Take 1 tablet by mouth every 6 (six) hours as needed for severe pain.  120 tablet 0  ? simvastatin (ZOCOR) 20 MG tablet TAKE 1 TABLET AT BEDTIME 90 tablet 3  ? ?No facility-administered medications prior to visit.  ? ? ?ROS: ?Review of Systems  ?Constitutional:  Negative for appetite change, fatigue and unexpected weight change.  ?HENT:  Negative for congestion, nosebleeds, sneezing, sore throat and trouble swallowing.   ?Eyes:  Negative for itching and visual disturbance.  ?Respiratory:  Negative for cough.   ?Cardiovascular:  Negative for chest pain, palpitations and leg swelling.  ?Gastrointestinal:  Negative for abdominal distention, blood in stool, diarrhea and nausea.  ?Genitourinary:  Negative for frequency and hematuria.  ?Musculoskeletal:  Positive for arthralgias and gait problem. Negative for back pain, joint swelling and neck pain.  ?Skin:  Negative for rash.  ?Neurological:  Negative for dizziness, tremors, speech difficulty and weakness.  ?Psychiatric/Behavioral:  Positive for decreased concentration. Negative for agitation, dysphoric mood, sleep disturbance and suicidal ideas. The patient is nervous/anxious.   ? ?Objective:  ?BP (!) 102/50 (BP Location: Left Arm, Patient Position: Sitting, Cuff Size: Large)   Pulse 74   Temp 98.7 ?F (37.1 ?C) (Oral)   Ht 6' (1.829 m)   SpO2 90%   BMI 35.53 kg/m?  ? ?BP Readings from Last 3 Encounters:  ?11/24/21 (!) 102/50  ?09/26/21 130/78  ?06/21/21 (!) 134/58  ? ? ?  Wt Readings from Last 3 Encounters:  ?09/26/21 262 lb (118.8 kg)  ?06/21/21 256 lb (116.1 kg)  ?03/21/21 261 lb 12.8 oz (118.8 kg)  ? ? ?Physical Exam ?Constitutional:   ?   General: He is not in acute distress. ?   Appearance: Normal appearance. He is well-developed.  ?   Comments: NAD  ?Eyes:  ?   Conjunctiva/sclera: Conjunctivae normal.  ?   Pupils: Pupils are equal, round, and reactive to light.  ?Neck:  ?   Thyroid: No thyromegaly.  ?   Vascular: No JVD.  ?Cardiovascular:  ?   Rate and Rhythm: Normal rate and regular rhythm.  ?   Heart sounds: Normal heart sounds.  No murmur heard. ?  No friction rub. No gallop.  ?Pulmonary:  ?   Effort: Pulmonary effort is normal. No respiratory distress.  ?   Breath sounds: Normal breath sounds. No wheezing or rales.  ?Chest:  ?   Chest wall: No tenderness.  ?Abdominal:  ?   General: Bowel sounds are normal. There is no distension.  ?   Palpations: Abdomen is soft. There is no mass.  ?   Tenderness: There is no abdominal tenderness. There is no guarding or rebound.  ?Musculoskeletal:     ?   General: No tenderness. Normal range of motion.  ?   Cervical back: Normal range of motion.  ?Lymphadenopathy:  ?   Cervical: No cervical adenopathy.  ?Skin: ?   General: Skin is warm and dry.  ?   Findings: No rash.  ?Neurological:  ?   Mental Status: He is alert and oriented to person, place, and time.  ?   Cranial Nerves: No cranial nerve deficit.  ?   Motor: No abnormal muscle tone.  ?   Coordination: Coordination normal.  ?   Gait: Gait normal.  ?   Deep Tendon Reflexes: Reflexes are normal and symmetric.  ?Psychiatric:     ?   Behavior: Behavior normal.     ?   Thought Content: Thought content normal.     ?   Judgment: Judgment normal.  ?Chronic leg edema and discoloration ?LS spine pain with range of motion ? ?Lab Results  ?Component Value Date  ? WBC 6.8 09/26/2021  ? HGB 13.2 09/26/2021  ? HCT 39.9 09/26/2021  ? PLT 88.0 (L) 09/26/2021  ? GLUCOSE 100 (H) 09/26/2021  ? CHOL 108 01/01/2018  ? TRIG 98 01/01/2018  ? HDL 35 (L) 01/01/2018  ? [place] 53 01/01/2018  ? ALT 12 09/26/2021  ? AST 19 09/26/2021  ? NA 143 09/26/2021  ? K 4.9 09/26/2021  ? CL 100 09/26/2021  ? CREATININE 0.95 09/26/2021  ? BUN 25 (H) 09/26/2021  ? CO2 37 (H) 09/26/2021  ? TSH 0.87 09/26/2021  ? PSA 0.23 09/15/2011  ? INR 1.0 11/28/2017  ? HGBA1C 5.2 09/26/2021  ? MICROALBUR 0.7 03/29/2016  ? ? ?VAS US CAROTID ? ?Result Date: 01/22/2021 ?Carotid Arterial Duplex Study Patient Name:  [name]  Date of Exam:   01/21/2021 Medical Rec #: [id number]            Accession #:     [id number] Date of Birth: 11-Jul-1956            Patient Gender: M Patient Age:   64Y Exam Location:  [facility] Procedure:      VAS US CAROTID Referring Phys: [id number] [name] --------------------------------------------------------------------------------  Indications:       Carotid artery disease,  left endarterectomy and patient                    denies any cerebrovascular symptoms today. Risk Factors:      Hypertension, hyperlipidemia, Diabetes, past history of                    smoking, coronary artery disease. Other Factors:     Left carotid endarterectomy 2010                     CABG                    COPD. Comparison Study:  Prior carotid duplex exam on 01/22/2020 showed highest                    velocities in right proximal ICA 100/18 cm/s and left mid ICA                    76/17 cm/s, s/p left CEA. Left subclavian artery 405 cm/s. Performing Technologist: Salvadore Dom RVT, RDCS (AE), RDMS  Examination Guidelines: A complete evaluation includes B-mode imaging, spectral Doppler, color Doppler, and power Doppler as needed of all accessible portions of each vessel. Bilateral testing is considered an integral part of a complete examination. Limited examinations for reoccurring indications may be performed as noted.  Right Carotid Findings: +----------+--------+--------+--------+--------------------+--------+           PSV cm/sEDV cm/sStenosisPlaque Description  Comments +----------+--------+--------+--------+--------------------+--------+ CCA Prox  71      13                                           +----------+--------+--------+--------+--------------------+--------+ CCA Mid   64      15              focal and calcific           +----------+--------+--------+--------+--------------------+--------+ CCA Distal58      15              diffuse and calcific         +----------+--------+--------+--------+--------------------+--------+ ICA Prox  103     31      1-39%   diffuse  and calcific         +----------+--------+--------+--------+--------------------+--------+ ICA Mid   101     28                                           +----------+--------+--------+--------+--------------------+--------+ ICA Distal7

## 2021-11-24 NOTE — Assessment & Plan Note (Addendum)
Cont on Percocet ?Potential benefits of a long term opioids use as well as potential risks (i.e. addiction risk, apnea etc) and complications (i.e. Somnolence, constipation and others) were explained to the patient and were aknowledged. ?Blue-Emu cream was recommended to use 2-3 times a day ? ?

## 2021-11-24 NOTE — Assessment & Plan Note (Signed)
Loosing weight on diet ?Wt Readings from Last 3 Encounters:  ?09/26/21 262 lb (118.8 kg)  ?06/21/21 256 lb (116.1 kg)  ?03/21/21 261 lb 12.8 oz (118.8 kg)  ? ? ?

## 2021-11-24 NOTE — Assessment & Plan Note (Signed)
No CP 

## 2021-11-24 NOTE — Assessment & Plan Note (Signed)
On O2 2 l/min [place] - at night ?

## 2021-11-29 ENCOUNTER — Telehealth: Payer: Self-pay

## 2021-11-29 ENCOUNTER — Encounter: Payer: Self-pay | Admitting: Internal Medicine

## 2021-11-29 NOTE — Telephone Encounter (Signed)
Pt calling requesting a medication dose change. Pt state that he cant find the oxyCODONE-acetaminophen (PERCOCET) 7.5-325 MG tablet  but do have oxyCODONE-acetaminophen (PERCOCET) 5-325 MG tablet.  ? ?Pharmacy: ?[id number] [address]? ?LOV 11/24/21 ?ROV 02/20/22 ?

## 2021-11-30 NOTE — Telephone Encounter (Signed)
It was addressed during the recent visit.  Thanks ?

## 2021-11-30 NOTE — Telephone Encounter (Signed)
Check  registry last filled 10/18/2021.Marland KitchenJohny Chess ? ?

## 2021-12-01 ENCOUNTER — Other Ambulatory Visit: Payer: Self-pay | Admitting: Internal Medicine

## 2021-12-01 ENCOUNTER — Encounter: Payer: Self-pay | Admitting: Internal Medicine

## 2021-12-01 MED ORDER — OXYCODONE-ACETAMINOPHEN 7.5-325 MG PO TABS: 1.0000 | ORAL_TABLET | Freq: Four times a day (QID) | ORAL | 0 refills | Status: DC | PRN

## 2021-12-03 ENCOUNTER — Other Ambulatory Visit: Payer: Self-pay | Admitting: Internal Medicine

## 2021-12-03 MED ORDER — OXYCODONE-ACETAMINOPHEN 5-325 MG PO TABS: 1.5000 | ORAL_TABLET | Freq: Four times a day (QID) | ORAL | 0 refills | Status: DC | PRN

## 2021-12-05 NOTE — Assessment & Plan Note (Signed)
Chronic  ?Cont on Clonazepam prn ?Not to take w/pain meds ? Potential benefits of a long term benzodiazepines  use as well as potential risks  and complications were explained to the patient and were aknowledged. ?

## 2022-01-20 ENCOUNTER — Ambulatory Visit: Payer: Medicare Other | Admitting: Cardiovascular Disease

## 2022-01-25 ENCOUNTER — Encounter (HOSPITAL_COMMUNITY): Payer: Medicare Other

## 2022-01-30 ENCOUNTER — Ambulatory Visit (HOSPITAL_COMMUNITY)
Admission: RE | Admit: 2022-01-30 | Discharge: 2022-01-30 | Disposition: A | Discharge status: Home / Self Care | Payer: Medicare Other | Source: Ambulatory Visit | Attending: Cardiology | Admitting: Cardiology

## 2022-01-30 DIAGNOSIS — I771 Stricture of artery: Secondary | ICD-10-CM | POA: Insufficient documentation

## 2022-01-30 DIAGNOSIS — I6523 Occlusion and stenosis of bilateral carotid arteries: Secondary | ICD-10-CM | POA: Diagnosis not present

## 2022-01-30 DIAGNOSIS — Z9889 Other specified postprocedural states: Secondary | ICD-10-CM | POA: Insufficient documentation

## 2022-02-20 ENCOUNTER — Encounter: Payer: Self-pay | Admitting: Internal Medicine

## 2022-02-20 ENCOUNTER — Ambulatory Visit (INDEPENDENT_AMBULATORY_CARE_PROVIDER_SITE_OTHER): Payer: Medicare Other | Admitting: Internal Medicine

## 2022-02-20 DIAGNOSIS — F413 Other mixed anxiety disorders: Secondary | ICD-10-CM

## 2022-02-20 DIAGNOSIS — I2510 Atherosclerotic heart disease of native coronary artery without angina pectoris: Secondary | ICD-10-CM | POA: Diagnosis not present

## 2022-02-20 DIAGNOSIS — I2583 Coronary atherosclerosis due to lipid rich plaque: Secondary | ICD-10-CM

## 2022-02-20 DIAGNOSIS — M79604 Pain in right leg: Secondary | ICD-10-CM

## 2022-02-20 DIAGNOSIS — I5032 Chronic diastolic (congestive) heart failure: Secondary | ICD-10-CM | POA: Diagnosis not present

## 2022-02-20 DIAGNOSIS — M545 Low back pain, unspecified: Secondary | ICD-10-CM

## 2022-02-20 DIAGNOSIS — M79605 Pain in left leg: Secondary | ICD-10-CM

## 2022-02-20 DIAGNOSIS — J449 Chronic obstructive pulmonary disease, unspecified: Secondary | ICD-10-CM

## 2022-02-20 DIAGNOSIS — R634 Abnormal weight loss: Secondary | ICD-10-CM

## 2022-02-20 LAB — COMPREHENSIVE METABOLIC PANEL
ALT: 11 U/L (ref 0–53)
AST: 18 U/L (ref 0–37)
Albumin: 4.6 g/dL (ref 3.5–5.2)
Alkaline Phosphatase: 98 U/L (ref 39–117)
BUN: 21 mg/dL (ref 6–23)
CO2: 34 mEq/L — ABNORMAL HIGH (ref 19–32)
Calcium: 9.8 mg/dL (ref 8.4–10.5)
Chloride: 98 mEq/L (ref 96–112)
Creatinine, Ser: 0.88 mg/dL (ref 0.40–1.50)
GFR: 88.67 mL/min (ref >60.00)
Glucose, Bld: 101 mg/dL — ABNORMAL HIGH (ref 70–99)
Potassium: 4.9 mEq/L (ref 3.5–5.1)
Sodium: 138 mEq/L (ref 135–145)
Total Bilirubin: 0.9 mg/dL (ref 0.2–1.2)
Total Protein: 7.5 g/dL (ref 6.0–8.3)

## 2022-02-20 LAB — CBC WITH DIFFERENTIAL/PLATELET
Basophils Absolute: 0.1 10*3/uL (ref 0.0–0.1)
Basophils Relative: 1.2 % (ref 0.0–3.0)
Eosinophils Absolute: 0.1 10*3/uL (ref 0.0–0.7)
Eosinophils Relative: 2.1 % (ref 0.0–5.0)
HCT: 36.8 % — ABNORMAL LOW (ref 39.0–52.0)
Hemoglobin: 12.2 g/dL — ABNORMAL LOW (ref 13.0–17.0)
Lymphocytes Relative: 7.6 % — ABNORMAL LOW (ref 12.0–46.0)
Lymphs Abs: 0.5 10*3/uL — ABNORMAL LOW (ref 0.7–4.0)
MCHC: 33.2 g/dL (ref 30.0–36.0)
MCV: 103.6 fl — ABNORMAL HIGH (ref 78.0–100.0)
Monocytes Absolute: 0.9 10*3/uL (ref 0.1–1.0)
Monocytes Relative: 14 % — ABNORMAL HIGH (ref 3.0–12.0)
Neutro Abs: 5 10*3/uL (ref 1.4–7.7)
Neutrophils Relative %: 75.1 % (ref 43.0–77.0)
Platelets: 101 10*3/uL — ABNORMAL LOW (ref 150.0–400.0)
RBC: 3.55 Mil/uL — ABNORMAL LOW (ref 4.22–5.81)
RDW: 14.3 % (ref 11.5–15.5)
WBC: 6.6 10*3/uL (ref 4.0–10.5)

## 2022-02-20 MED ORDER — OXYCODONE-ACETAMINOPHEN 7.5-325 MG PO TABS: 1.0000 | ORAL_TABLET | Freq: Four times a day (QID) | ORAL | 0 refills | Status: DC | PRN

## 2022-02-20 MED ORDER — CLONAZEPAM 0.5 MG PO TABS
ORAL_TABLET | ORAL | 3 refills | Status: DC
Start: 2022-02-20 — End: 2022-05-23

## 2022-02-20 NOTE — Assessment & Plan Note (Signed)
Wt Readings from Last 3 Encounters:  02/20/22 254 lb (115.2 kg)  09/26/21 262 lb (118.8 kg)  06/21/21 256 lb (116.1 kg)  Check TSH RTC 2 mo

## 2022-02-20 NOTE — Assessment & Plan Note (Signed)
NAS diet, on O2 l/min [place] - at night

## 2022-02-20 NOTE — Assessment & Plan Note (Addendum)
On O2 at night Use HHA

## 2022-02-20 NOTE — Progress Notes (Signed)
Subjective:  Patient ID: [name], male    DOB: Jul 06, 1956  Age: 66 y.o. MRN: [id number]  CC: No chief complaint on file.   HPI Cathan Gearin presents for chronic pain, CHF, COPD, anxiety  Outpatient Medications Prior to Visit  Medication Sig Dispense Refill   albuterol (PROVENTIL) (2.5 MG/3ML) 0.083% nebulizer solution INHALE 1 VIAL VIA NEBULIZER EVERY DAY AS NEEDED FOR WHEEZING/SHORTNESS OF BREATH 75 mL 5   ANORO ELLIPTA 62.5-25 MCG/INH AEPB Inhale 1 puff into the lungs daily as needed. For wheezing & SOB 180 each 3   aspirin EC 81 MG tablet Take 81 mg by mouth every evening.      clopidogrel (PLAVIX) 75 MG tablet TAKE 1 TABLET BY MOUTH EVERY DAY WITH BREAKFAST 90 tablet 3   KLOR-CON M10 10 MEQ tablet Take 10 mEq by mouth daily. Take 1 by mouth daily     metolazone (ZAROXOLYN) 5 MG tablet TAKE 1 TABLET BY MOUTH EVERY DAY 30 tablet 11   metoprolol succinate (TOPROL-XL) 50 MG 24 hr tablet Take 1 tablet (50 mg total) by mouth daily. Take with or immediately following a meal. 90 tablet 3   oxyCODONE-acetaminophen (PERCOCET/ROXICET) 5-325 MG tablet Take 1.5 tablets by mouth every 6 (six) hours as needed for severe pain. 180 tablet 0   oxymetazoline (AFRIN) 0.05 % nasal spray Place 1 spray into both nostrils daily as needed for congestion.     simvastatin (ZOCOR) 20 MG tablet Take 1 tablet (20 mg total) by mouth at bedtime. 90 tablet 3   torsemide (DEMADEX) 100 MG tablet Take 1 tablet (100 mg total) by mouth daily. 90 tablet 3   clonazePAM (KLONOPIN) 0.5 MG tablet TAKE 1 TABLET BY MOUTH TWICE A DAY AS NEEDED 60 tablet 3   oxyCODONE-acetaminophen (PERCOCET) 7.5-325 MG tablet Take 1 tablet by mouth every 6 (six) hours as needed for severe pain. 120 tablet 0   oxyCODONE-acetaminophen (PERCOCET) 7.5-325 MG tablet Take 1 tablet by mouth every 6 (six) hours as needed for severe pain. 120 tablet 0   nitroGLYCERIN (NITROSTAT) 0.4 MG SL tablet Place 1 tablet (0.4 mg total) under the  tongue every 5 (five) minutes as needed for chest pain (Up to 3 doses). 25 tablet 1   No facility-administered medications prior to visit.    ROS: Review of Systems  Constitutional:  Positive for fatigue and unexpected weight change. Negative for appetite change.  HENT:  Negative for congestion, nosebleeds, sneezing, sore throat and trouble swallowing.   Eyes:  Negative for itching and visual disturbance.  Respiratory:  Negative for cough.   Cardiovascular:  Positive for leg swelling. Negative for chest pain and palpitations.  Gastrointestinal:  Negative for abdominal distention, blood in stool, diarrhea and nausea.  Genitourinary:  Negative for frequency and hematuria.  Musculoskeletal:  Positive for arthralgias, back pain and gait problem. Negative for joint swelling and neck pain.  Skin:  Positive for color change. Negative for rash.  Neurological:  Positive for weakness. Negative for dizziness, tremors and speech difficulty.  Psychiatric/Behavioral:  Negative for agitation, dysphoric mood and sleep disturbance. The patient is not nervous/anxious.     Objective:  BP 138/80 (BP Location: Left Arm, Patient Position: Sitting, Cuff Size: Normal)   Pulse 70   Temp 98.5 F (36.9 C) (Oral)   Ht 6' (1.829 m)   Wt 254 lb (115.2 kg)   SpO2 92%   BMI 34.45 kg/m   BP Readings from Last 3 Encounters:  02/20/22 138/80  11/24/21 (!) 102/50  09/26/21 130/78    Wt Readings from Last 3 Encounters:  02/20/22 254 lb (115.2 kg)  09/26/21 262 lb (118.8 kg)  06/21/21 256 lb (116.1 kg)    Physical Exam Constitutional:      General: He is not in acute distress.    Appearance: He is well-developed. He is obese.     Comments: NAD  Eyes:     Conjunctiva/sclera: Conjunctivae normal.     Pupils: Pupils are equal, round, and reactive to light.  Neck:     Thyroid: No thyromegaly.     Vascular: No JVD.  Cardiovascular:     Rate and Rhythm: Normal rate and regular rhythm.     Heart sounds:  Normal heart sounds. No murmur heard.    No friction rub. No gallop.  Pulmonary:     Effort: Pulmonary effort is normal. No respiratory distress.     Breath sounds: Wheezing and rhonchi present. No rales.  Chest:     Chest wall: No tenderness.  Abdominal:     General: Bowel sounds are normal. There is no distension.     Palpations: Abdomen is soft. There is no mass.     Tenderness: There is no abdominal tenderness. There is no guarding or rebound.  Musculoskeletal:        General: Tenderness present. Normal range of motion.     Cervical back: Normal range of motion.     Right lower leg: Edema present.     Left lower leg: Edema present.  Lymphadenopathy:     Cervical: No cervical adenopathy.  Skin:    General: Skin is warm and dry.     Findings: Rash present.  Neurological:     Mental Status: He is alert and oriented to person, place, and time.     Cranial Nerves: No cranial nerve deficit.     Motor: No abnormal muscle tone.     Coordination: Coordination abnormal.     Gait: Gait abnormal.     Deep Tendon Reflexes: Reflexes are normal and symmetric.  Psychiatric:        Behavior: Behavior normal.        Thought Content: Thought content normal.        Judgment: Judgment normal.   Antalgic gait Brown ankles LS w/pain  Lab Results  Component Value Date   WBC 6.8 09/26/2021   HGB 13.2 09/26/2021   HCT 39.9 09/26/2021   PLT 88.0 (L) 09/26/2021   GLUCOSE 100 (H) 09/26/2021   CHOL 108 01/01/2018   TRIG 98 01/01/2018   HDL 35 (L) 01/01/2018   LDLCALC 53 01/01/2018   ALT 12 09/26/2021   AST 19 09/26/2021   NA 143 09/26/2021   K 4.9 09/26/2021   CL 100 09/26/2021   CREATININE 0.95 09/26/2021   BUN 25 (H) 09/26/2021   CO2 37 (H) 09/26/2021   TSH 0.87 09/26/2021   PSA 0.23 09/15/2011   INR 1.0 11/28/2017   HGBA1C 5.2 09/26/2021   MICROALBUR 0.7 03/29/2016    VAS US CAROTID  Result Date: 02/01/2022 Carotid Arterial Duplex Study Patient Name:  BOLIVAR KORANDA   Date of Exam:   01/30/2022 Medical Rec #: [id number]            Accession #:    [id number] Date of Birth: 28-Nov-1955            Patient Gender: M Patient Age:   68 years Exam Location:  [facility] Procedure:  VAS US CAROTID Referring Phys: Jenkins Rouge --------------------------------------------------------------------------------  Indications:       Carotid artery disease and left endarterectomy. Risk Factors:      Hypertension, hyperlipidemia, Diabetes, past history of                    smoking, coronary artery disease. Comparison Study:  Carotid Duplex done 01/22/20 showed 1-39% ICA stenosis,                    bilaterally, s/p left CEA. 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 Performing Technologist: Jeneen Montgomery RDMS, RVT, RDCS  Examination Guidelines: A complete evaluation includes B-mode imaging, spectral Doppler, color Doppler, and power Doppler as needed of all accessible portions of each vessel. Bilateral testing is considered an integral part of a complete examination. Limited examinations for reoccurring indications may be performed as noted.  Right Carotid Findings: +----------+--------+--------+--------+-------------------------+---------+           PSV cm/sEDV cm/sStenosisPlaque Description       Comments  +----------+--------+--------+--------+-------------------------+---------+ CCA Prox  76      10                                                 +----------+--------+--------+--------+-------------------------+---------+ CCA Distal64      16                                                 +----------+--------+--------+--------+-------------------------+---------+ ICA Prox  97      36      1-39%   calcific and heterogenousShadowing +----------+--------+--------+--------+-------------------------+---------+ ICA Mid   89      32                                                  +----------+--------+--------+--------+-------------------------+---------+ ICA Distal80      25                                                 +----------+--------+--------+--------+-------------------------+---------+ ECA       209     17      >50%    calcific and heterogenous          +----------+--------+--------+--------+-------------------------+---------+ +----------+--------+-------+----------------+-------------------+           PSV cm/sEDV cmsDescribe        Arm Pressure (mmHG) +----------+--------+-------+----------------+-------------------+ [id number]     0      Multiphasic, [id number]                 +----------+--------+-------+----------------+-------------------+ +---------+--------+--+--------+-+---------+ VertebralPSV cm/s34EDV cm/s9Antegrade +---------+--------+--+--------+-+---------+  Left Carotid Findings: +----------+--------+--------+--------+-------------------------+--------+           PSV cm/sEDV cm/sStenosisPlaque Description       Comments +----------+--------+--------+--------+-------------------------+--------+ CCA Prox  93      11                                                +----------+--------+--------+--------+-------------------------+--------+  CCA Distal58      13                                                +----------+--------+--------+--------+-------------------------+--------+ ICA Prox  112     30              calcific and heterogenous         +----------+--------+--------+--------+-------------------------+--------+ ICA Mid   111     36      1-39%                                     +----------+--------+--------+--------+-------------------------+--------+ ICA Distal101     34                                                +----------+--------+--------+--------+-------------------------+--------+ ECA       344     16      >50%    calcific and heterogenous          +----------+--------+--------+--------+-------------------------+--------+ +----------+--------+--------+--------+-------------------+           PSV cm/sEDV cm/sDescribeArm Pressure (mmHG) +----------+--------+--------+--------+-------------------+ [id number]     0       Stenotic102                 +----------+--------+--------+--------+-------------------+ +---------+--------+--+--------+--+---------+ VertebralPSV cm/s51EDV cm/s14Antegrade +---------+--------+--+--------+--+---------+   Summary: Right Carotid: Velocities in the right ICA are consistent with a 1-39% stenosis.                The ECA appears >50% stenosed. Left Carotid: Velocities in the left ICA are consistent with a 1-39% stenosis.               The ECA appears >50% stenosed. Patent left endartectomy site. Vertebrals:  Bilateral vertebral arteries demonstrate antegrade flow. Subclavians: Left subclavian artery was stenotic. Normal flow hemodynamics were              seen in the right subclavian artery. *See table(s) above for measurements and observations. Suggest follow up study in 12 months. Electronically signed by Larae Grooms MD on 02/01/2022 at 12:27:08 PM.    Final     Assessment & Plan:   Problem List Items Addressed This Visit     Anxiety disorder    Cont on Clonazepam prn Not to take w/pain meds  Potential benefits of a long term benzodiazepines  use as well as potential risks  and complications were explained to the patient and were aknowledged.      Chronic diastolic CHF (congestive heart failure) (HCC) (Chronic)    NAS diet, on O2 l/min [place] - at night      Relevant Orders   CBC with Differential/Platelet   Comprehensive metabolic panel   TSH   COPD mixed type ([place])    On O2 at night Use HHA      Relevant Orders   CBC with Differential/Platelet   Comprehensive metabolic panel   Low back pain radiating to both legs    Cont on Percocet prn Not to take w/Clonazepam  Potential benefits  of a long term opioids use as well as potential risks (i.e. addiction risk, apnea etc) and  complications (i.e. Somnolence, constipation and others) were explained to the patient and were aknowledged.  Blue-Emu cream was recommended to use 2-3 times a day      Relevant Medications   oxyCODONE-acetaminophen (PERCOCET) 7.5-325 MG tablet   oxyCODONE-acetaminophen (PERCOCET) 7.5-325 MG tablet   Other Relevant Orders   CBC with Differential/Platelet   Comprehensive metabolic panel   Weight loss    Wt Readings from Last 3 Encounters:  02/20/22 254 lb (115.2 kg)  09/26/21 262 lb (118.8 kg)  06/21/21 256 lb (116.1 kg)  Check TSH RTC 2 mo          Meds ordered this encounter  Medications   clonazePAM (KLONOPIN) 0.5 MG tablet    Sig: TAKE 1 TABLET BY MOUTH TWICE A DAY AS NEEDED    Dispense:  60 tablet    Refill:  3   oxyCODONE-acetaminophen (PERCOCET) 7.5-325 MG tablet    Sig: Take 1 tablet by mouth every 6 (six) hours as needed for severe pain.    Dispense:  120 tablet    Refill:  0    Please fill on or after 03/12/22   oxyCODONE-acetaminophen (PERCOCET) 7.5-325 MG tablet    Sig: Take 1 tablet by mouth every 6 (six) hours as needed for severe pain.    Dispense:  120 tablet    Refill:  0    Please fill on or after 04/11/22      Follow-up: Return in about 3 months (around 05/23/2022) for a follow-up visit.  Walker Kehr, MD

## 2022-02-20 NOTE — Assessment & Plan Note (Signed)
Cont on Percocet prn Not to take w/Clonazepam  Potential benefits of a long term opioids use as well as potential risks (i.e. addiction risk, apnea etc) and complications (i.e. Somnolence, constipation and others) were explained to the patient and were aknowledged.  Blue-Emu cream was recommended to use 2-3 times a day

## 2022-02-20 NOTE — Assessment & Plan Note (Signed)
Cont on Clonazepam prn Not to take w/pain meds  Potential benefits of a long term benzodiazepines  use as well as potential risks  and complications were explained to the patient and were aknowledged.

## 2022-03-03 ENCOUNTER — Ambulatory Visit (INDEPENDENT_AMBULATORY_CARE_PROVIDER_SITE_OTHER): Payer: Medicare Other

## 2022-03-03 DIAGNOSIS — Z0000 Encounter for general adult medical examination without abnormal findings: Secondary | ICD-10-CM

## 2022-03-03 NOTE — Progress Notes (Signed)
I connected with [name] today by telephone and verified that I am speaking with the correct person using two identifiers. Location patient: home Location provider: work Persons participating in the virtual visit: patient, provider.   I discussed the limitations, risks, security and privacy concerns of performing an evaluation and management service by telephone and the availability of in person appointments. I also discussed with the patient that there may be a patient responsible charge related to this service. The patient expressed understanding and verbally consented to this telephonic visit.    Interactive audio and video telecommunications were attempted between this provider and patient, however failed, due to patient having technical difficulties OR patient did not have access to video capability.  We continued and completed visit with audio only.  Some vital signs may be absent or patient reported.   Time Spent with patient on telephone encounter: 30 minutes  Subjective:   [name] is a 66 y.o. male who presents for Medicare Annual/Subsequent preventive examination.  Review of Systems     Cardiac Risk Factors include: advanced age (>66mn, >>49women);diabetes mellitus;dyslipidemia;family history of premature cardiovascular disease;hypertension;male gender;obesity (BMI >30kg/m2)     Objective:    There were no vitals filed for this visit. There is no height or weight on file to calculate BMI.     03/03/2022   10:30 AM 03/16/2020   10:03 AM 12/07/2017    7:47 AM 11/15/2017    7:27 PM 05/18/2017    7:50 AM 08/25/2016    9:13 AM 06/23/2016   10:27 AM  Advanced Directives  Does Patient Have a Medical Advance Directive? Yes No No No No Yes No  Type of Advance Directive Living will;Healthcare [place]   Does patient want to make changes to medical advance directive? No - Patient declined     No - Patient declined    Copy of [place] Chart? No - copy requested     Yes   Would patient like information on creating a medical advance directive?  No - Patient declined No - Patient declined No - Patient declined No - Patient declined  No - patient declined information    Current Medications (verified) Outpatient Encounter Medications as of 03/03/2022  Medication Sig   albuterol (PROVENTIL) (2.5 MG/3ML) 0.083% nebulizer solution INHALE 1 VIAL VIA NEBULIZER EVERY DAY AS NEEDED FOR WHEEZING/SHORTNESS OF BREATH   ANORO ELLIPTA 62.5-25 MCG/INH AEPB Inhale 1 puff into the lungs daily as needed. For wheezing & SOB   aspirin EC 81 MG tablet Take 81 mg by mouth every evening.    clonazePAM (KLONOPIN) 0.5 MG tablet TAKE 1 TABLET BY MOUTH TWICE A DAY AS NEEDED   clopidogrel (PLAVIX) 75 MG tablet TAKE 1 TABLET BY MOUTH EVERY DAY WITH BREAKFAST   KLOR-CON M10 10 MEQ tablet Take 10 mEq by mouth daily. Take 1 by mouth daily   metolazone (ZAROXOLYN) 5 MG tablet TAKE 1 TABLET BY MOUTH EVERY DAY   metoprolol succinate (TOPROL-XL) 50 MG 24 hr tablet Take 1 tablet (50 mg total) by mouth daily. Take with or immediately following a meal.   nitroGLYCERIN (NITROSTAT) 0.4 MG SL tablet Place 1 tablet (0.4 mg total) under the tongue every 5 (five) minutes as needed for chest pain (Up to 3 doses).   oxyCODONE-acetaminophen (PERCOCET) 7.5-325 MG tablet Take 1 tablet by mouth every 6 (six) hours as needed for severe pain.  oxyCODONE-acetaminophen (PERCOCET) 7.5-325 MG tablet Take 1 tablet by mouth every 6 (six) hours as needed for severe pain.   oxyCODONE-acetaminophen (PERCOCET/ROXICET) 5-325 MG tablet Take 1.5 tablets by mouth every 6 (six) hours as needed for severe pain.   oxymetazoline (AFRIN) 0.05 % nasal spray Place 1 spray into both nostrils daily as needed for congestion.   simvastatin (ZOCOR) 20 MG tablet Take 1 tablet (20 mg total) by mouth at bedtime.   torsemide (DEMADEX) 100 MG tablet Take 1 tablet (100  mg total) by mouth daily.   No facility-administered encounter medications on file as of 03/03/2022.    Allergies (verified) Bendamustine hcl, Heparin, and Tape   History: Past Medical History:  Diagnosis Date   Anginal pain ([place])    not had to use in awhile  none in 10 years   Anxiety    Basal cell carcinoma of nose    Blood dyscrasia    trouble clotting    Carotid artery disease ([place])    s/p L CEA 2009 (hx of evacuation of hematoma due to heparin)   CHF (congestive heart failure) (HCC)    Chronic lower back pain    COPD (chronic obstructive pulmonary disease) ([place])    Coronary artery disease    CABG 2005. s/p PTCA and stenting of the saphenous vein graft to PDA and saphenous vein graft to obtuse marginal by Dr. Burt Knack 09/29/11. 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   Myocardial infarction ([place])    Obesity    Shortness of breath dyspnea    with exertion    Sleep apnea    "used to"      could not use 2006   Past Surgical History:  Procedure Laterality Date   BASAL CELL CARCINOMA EXCISION  2000's   nose   CARDIAC CATHETERIZATION  12/07/2017   CAROTID ENDARTERECTOMY  03/2008   left   CORONARY ANGIOPLASTY WITH STENT PLACEMENT  09/29/11   "2"   CORONARY ARTERY BYPASS GRAFT  2005   CABG X3   CORONARY STENT INTERVENTION  12/07/2017   CORONARY STENT INTERVENTION N/A 12/07/2017   Procedure: CORONARY STENT INTERVENTION;  Surgeon: Burnell Blanks, MD;  Location: [facility];  Service: Cardiovascular;  Laterality: N/A;   evacuation of hematoma  03/2008   left neck; S/P endarterectomy; "cause heparin clotted it up"   I & D EXTREMITY Left 02/11/2015    Procedure: IRRIGATION AND DEBRIDEMENT LEFT LONG FINGER;  Surgeon: Leanora Cover, MD;  Location: [facility];  Service: Orthopedics;  Laterality: Left;  I and D Left Long Finger   IR REMOVAL TUN ACCESS W/ PORT W/O FL MOD SED  05/18/2017   MASS EXCISION Left 12/11/2014   Procedure: EXCISIONAL BIOPSY OF LEFT SUPRA CLAVICULAR NECK MASS;  Surgeon: Jerrell Belfast, MD;  Location: [facility];  Service: ENT;  Laterality: Left;   PERCUTANEOUS CORONARY STENT INTERVENTION (PCI-S) N/A 09/29/2011   Procedure: PERCUTANEOUS CORONARY STENT INTERVENTION (PCI-S);  Surgeon: Sherren Mocha, MD;  Location[facility];  Service: Cardiovascular;  Laterality: N/A;   PORTACATH PLACEMENT  2016   RIGHT/LEFT HEART CATH AND CORONARY/GRAFT ANGIOGRAPHY N/A 12/07/2017   Procedure: RIGHT/LEFT HEART CATH AND CORONARY/GRAFT ANGIOGRAPHY;  Surgeon: Burnell Blanks, MD;  Location: [facility];  Service: Cardiovascular;  Laterality: N/A;   SUPERFICIAL LYMPH NODE BIOPSY / EXCISION Left    Family History  Problem Relation Age of Onset   Heart disease Father    Bone cancer Mother    Social History   Socioeconomic History   Marital status: Married    Spouse name: Not on file   Number of children: 1   Years of education: Not on file   Highest education level: Not on file  Occupational History   Occupation: Museum/gallery curator  Tobacco Use   Smoking status: Former    Packs/day: 2.00    Years: 41.00    Total pack years: 82.00    Types: Cigarettes    Quit date: 11/23/2017    Years since quitting: 4.2   Smokeless tobacco: Former    Types: Chew   Tobacco comments:    consult entered  Vaping Use   Vaping Use: Never used  Substance and Sexual Activity   Alcohol use: No    Alcohol/week: 0.0 standard drinks of alcohol    Comment: 09/29/11 "quit years ago"   Drug use: Yes    Types: Marijuana    Comment: "last time was in the 1990's"   Sexual activity: Not Currently  Other Topics Concern   Not on file   Social History Narrative   Not on file   Social Determinants of Health   Financial Resource Strain: Low Risk  (03/03/2022)   Overall Financial Resource Strain (CARDIA)    Difficulty of Paying Living Expenses: Not hard at all  Food Insecurity: No Food Insecurity (03/03/2022)   Hunger Vital Sign    Worried About Running Out of Food in the Last Year: Never true    [place] in the Last Year: Never true  Transportation Needs: No Transportation Needs (03/03/2022)   PRAPARE - Hydrologist (Medical): No    Lack of Transportation (Non-Medical): No  Physical Activity: Sufficiently Active (03/03/2022)   Exercise Vital Sign    Days of Exercise per Week: 5 days    Minutes of Exercise per Session: 30 min  Stress: No Stress Concern Present (03/03/2022)   [place]    Feeling of Stress : Not at all  Social Connections: [place] (03/03/2022)   Social Connection and Isolation Panel [NHANES]    Frequency of Communication with Friends and Family: More than three times a week    Frequency of Social Gatherings with Friends and Family: More than three times a week    Attends Religious Services: More than 4 times per year    Active Member of [employer] or Organizations: Yes    Attends Music therapist: More than 4 times per year    Marital Status: Married    Tobacco Counseling Counseling given: Not Answered Tobacco comments: consult entered   Clinical Intake:  Pre-visit preparation completed: Yes  Pain : No/denies pain     BMI - recorded: 34.45 Nutritional Status: BMI > 30  Obese Nutritional Risks: None Diabetes: No  How often do you need to have someone help you when you read instructions, pamphlets, or other written materials from your doctor or pharmacy?: 1 - Never What is the last grade level you completed in school?: 10th  grade  Diabetic? no  Interpreter Needed?:  No  Information entered by :: Adaline Trejos, LPN.   Activities of Daily Living    03/03/2022   10:33 AM  In your present state of health, do you have any difficulty performing the following activities:  Hearing? 0  Vision? 0  Difficulty concentrating or making decisions? 0  Walking or climbing stairs? 0  Dressing or bathing? 0  Doing errands, shopping? 0  Preparing Food and eating ? N  Using the Toilet? N  In the past six months, have you accidently leaked urine? N  Do you have problems with loss of bowel control? N  Managing your Medications? N  Managing your Finances? N  Housekeeping or managing your Housekeeping? N    Patient Care Team: Plotnikov, [name], MD as PCP - General (Internal Medicine) [name], MD as PCP - Cardiology (Cardiology) [name], MD as Consulting Physician (Cardiology) Heath Lark, MD as Consulting Physician (Hematology and Oncology) Jerrell Belfast, MD as Consulting Physician (Otolaryngology) Daryll Brod, MD as Consulting Physician (Orthopedic Surgery) Brand Males, MD as Consulting Physician (Pulmonary Disease) Knox Royalty, RN as Case Manager  Indicate any recent Medical Services you may have received from other than [facility] providers in the past year (date may be approximate).     Assessment:   This is a routine wellness examination for [place].  Hearing/Vision screen Hearing Screening - Comments:: No hearing issues. No hearing aids. Vision Screening - Comments:: Patient does wear readers.   Dietary issues and exercise activities discussed: Current Exercise Habits: Home exercise routine, Time (Minutes): 30, Frequency (Times/Week): 5, Weekly Exercise (Minutes/Week): 150, Intensity: Moderate, Exercise limited by: orthopedic condition(s);respiratory conditions(s);neurologic condition(s)   Goals Addressed   None   Depression Screen    03/03/2022   10:30 AM 03/03/2022   10:27 AM 03/03/2022   10:26 AM 05/06/2021     9:00 AM 03/21/2021    9:50 AM 03/16/2020   10:01 AM  PHQ 2/9 Scores  PHQ - 2 Score 1 0 0 1 0 0  PHQ- 9 Score     0     Fall Risk    03/03/2022   10:27 AM 10/31/2021    9:00 AM 05/06/2021    9:00 AM 03/21/2021    9:50 AM 03/16/2020   10:03 AM  Fall Risk   Falls in the past year? 0 0 0 0 0  Number falls in past yr: 0 0 0 0 0  Injury with Fall? 0 0 0 0 0  Comment  N/A- no falls reported x 12 months; does not use assitive devices ona regular basis; occasionally uses cane N/A- no falls reported    Risk for fall due to : No Fall Risks Medication side effect Other (Comment) No Fall Risks No Fall Risks  Risk for fall due to: Comment   chronic pain    Follow up Falls evaluation completed Falls prevention discussed Falls prevention discussed  Falls evaluation completed    FALL RISK PREVENTION PERTAINING TO THE HOME:  Any stairs in or around the home? Yes  If so, are there any without handrails? No  Home free of loose throw rugs in walkways, pet beds, electrical cords, etc? Yes  Adequate lighting in your home to reduce risk of falls? Yes   ASSISTIVE DEVICES UTILIZED TO PREVENT FALLS:  Life alert? No  Use of a cane, walker or w/c? No  Grab bars in the bathroom? Yes  Shower chair or bench  in shower? No  Elevated toilet seat or a handicapped toilet? Yes   TIMED UP AND GO:  Was the test performed? No .  Length of time to ambulate 10 feet: n/a sec.   Appearance of gait: Gait not evaluated during this visit.  Cognitive Function:        03/03/2022   10:34 AM  6CIT Screen  What Year? 0 points  What month? 0 points  What time? 0 points  Count back from 20 0 points  Months in reverse 0 points  Repeat phrase 0 points  Total Score 0 points    Immunizations Immunization History  Administered Date(s) Administered   Fluad Quad(high Dose 65+) 06/22/2020, 06/21/2021   Influenza Split 09/30/2011   Influenza,inj,Quad PF,6+ Mos 09/22/2013, 09/25/2014, 04/30/2015, 06/23/2016, 06/19/2018    PFIZER(Purple Top)SARS-COV-2 Vaccination 10/27/2019, 11/13/2019   Pneumococcal Conjugate-13 09/25/2014   Pneumococcal Polysaccharide-23 03/30/2015, 03/22/2020   Tdap 03/30/2015    TDAP status: Up to date  Flu Vaccine status: Up to date  Pneumococcal vaccine status: Up to date  Covid-19 vaccine status: Completed vaccines  Qualifies for Shingles Vaccine? Yes   Zostavax completed No   Shingrix Completed?: No.    Education has been provided regarding the importance of this vaccine. Patient has been advised to call insurance company to determine out of pocket expense if they have not yet received this vaccine. Advised may also receive vaccine at local pharmacy or Health Dept. Verbalized acceptance and understanding.  Screening Tests Health Maintenance  Topic Date Due   OPHTHALMOLOGY EXAM  Never done   COLONOSCOPY (Pts 45-32yr Insurance coverage will need to be confirmed)  Never done   Diabetic kidney evaluation - Urine ACR  03/29/2017   FOOT EXAM  03/29/2017   COVID-19 Vaccine (3 - Pfizer risk series) 12/11/2019   INFLUENZA VACCINE  03/14/2022   HEMOGLOBIN A1C  03/26/2022   Diabetic kidney evaluation - GFR measurement  02/21/2023   TETANUS/TDAP  03/29/2025   Pneumonia Vaccine 66 Years old  Completed   Hepatitis C Screening  Completed   HPV VACCINES  Aged Out   Zoster Vaccines- Shingrix  Discontinued    Health Maintenance  Health Maintenance Due  Topic Date Due   OPHTHALMOLOGY EXAM  Never done   COLONOSCOPY (Pts 45-479yrInsurance coverage will need to be confirmed)  Never done   Diabetic kidney evaluation - Urine ACR  03/29/2017   FOOT EXAM  03/29/2017   COVID-19 Vaccine (3 - Pfizer risk series) 12/11/2019    Colorectal cancer screening: Type of screening: Cologuard. Completed 04/06/2020. Repeat every 3 years  Lung Cancer Screening: (Low Dose CT Chest recommended if Age 68-80ears, 30 pack-year currently smoking OR have quit w/in 15years.) does not qualify.   Lung  Cancer Screening Referral: no  Additional Screening:  Hepatitis C Screening: does qualify; Completed 01/15/2015  Vision Screening: Recommended annual ophthalmology exams for early detection of glaucoma and other disorders of the eye. Is the patient up to date with their annual eye exam?  No  Who is the provider or what is the name of the office in which the patient attends annual eye exams? No Vision Insurance If pt is not established with a provider, would they like to be referred to a provider to establish care? No .   Dental Screening: Recommended annual dental exams for proper oral hygiene  Community Resource Referral / Chronic Care Management: CRR required this visit?  No   CCM required this visit?  No  Plan:     I have personally reviewed and noted the following in the patient's chart:   Medical and social history Use of alcohol, tobacco or illicit drugs  Current medications and supplements including opioid prescriptions. Patient is not currently taking opioid prescriptions. Functional ability and status Nutritional status Physical activity Advanced directives List of other physicians Hospitalizations, surgeries, and ER visits in previous 12 months Vitals Screenings to include cognitive, depression, and falls Referrals and appointments  In addition, I have reviewed and discussed with patient certain preventive protocols, quality metrics, and best practice recommendations. A written personalized care plan for preventive services as well as general preventive health recommendations were provided to patient.     Sheral Flow, LPN   3/38/2505   Nurse Notes:  There were no vitals filed for this visit. There is no height or weight on file to calculate BMI.

## 2022-03-03 NOTE — Patient Instructions (Signed)
[name] , Thank you for taking time to come for your Medicare Wellness Visit. I appreciate your ongoing commitment to your health goals. Please review the following plan we discussed and let me know if I can assist you in the future.   Screening recommendations/referrals: Cologuard: 04/06/2020; due every 3 years Recommended yearly ophthalmology/optometry visit for glaucoma screening and checkup Recommended yearly dental visit for hygiene and checkup  Vaccinations: Influenza vaccine: 06/21/2021 Pneumococcal vaccine: 09/25/2014, 03/22/2020 Tdap vaccine: 03/30/2015; due every 10 years Shingles vaccine: declined/never done   Covid-19: 10/27/2019, 11/13/2019  Advanced directives: Yes; Please bring a copy of your health care power of attorney and living will to the office at your convenience.  Conditions/risks identified: Yes  Next appointment: Please schedule your next Medicare Wellness Visit with your Nurse Health Advisor in 1 year by calling [phone].  Preventive Care 45 Years and Older, Male Preventive care refers to lifestyle choices and visits with your health care provider that can promote health and wellness. What does preventive care include? A yearly physical exam. This is also called an annual well check. Dental exams once or twice a year. Routine eye exams. Ask your health care provider how often you should have your eyes checked. Personal lifestyle choices, including: Daily care of your teeth and gums. Regular physical activity. Eating a healthy diet. Avoiding tobacco and drug use. Limiting alcohol use. Practicing safe sex. Taking low doses of aspirin every day. Taking vitamin and mineral supplements as recommended by your health care provider. What happens during an annual well check? The services and screenings done by your health care provider during your annual well check will depend on your age, overall health, lifestyle risk factors, and family history of  disease. Counseling  Your health care provider may ask you questions about your: Alcohol use. Tobacco use. Drug use. Emotional well-being. Home and relationship well-being. Sexual activity. Eating habits. History of falls. Memory and ability to understand (cognition). Work and work Statistician. Screening  You may have the following tests or measurements: Height, weight, and BMI. Blood pressure. Lipid and cholesterol levels. These may be checked every 5 years, or more frequently if you are over 4 years old. Skin check. Lung cancer screening. You may have this screening every year starting at age 43 if you have a 30-pack-year history of smoking and currently smoke or have quit within the past 15 years. Fecal occult blood test (FOBT) of the stool. You may have this test every year starting at age 54. Flexible sigmoidoscopy or colonoscopy. You may have a sigmoidoscopy every 5 years or a colonoscopy every 10 years starting at age 31. Prostate cancer screening. Recommendations will vary depending on your family history and other risks. Hepatitis C blood test. Hepatitis B blood test. Sexually transmitted disease (STD) testing. Diabetes screening. This is done by checking your blood sugar (glucose) after you have not eaten for a while (fasting). You may have this done every 1-3 years. Abdominal aortic aneurysm (AAA) screening. You may need this if you are a current or former smoker. Osteoporosis. You may be screened starting at age 86 if you are at high risk. Talk with your health care provider about your test results, treatment options, and if necessary, the need for more tests. Vaccines  Your health care provider may recommend certain vaccines, such as: Influenza vaccine. This is recommended every year. Tetanus, diphtheria, and acellular pertussis (Tdap, Td) vaccine. You may need a Td booster every 10 years. Zoster vaccine. You may need this  after age 65. Pneumococcal 13-valent  conjugate (PCV13) vaccine. One dose is recommended after age 36. Pneumococcal polysaccharide (PPSV23) vaccine. One dose is recommended after age 9. Talk to your health care provider about which screenings and vaccines you need and how often you need them. This information is not intended to replace advice given to you by your health care provider. Make sure you discuss any questions you have with your health care provider. Document Released: 08/27/2015 Document Revised: 04/19/2016 Document Reviewed: 06/01/2015 Elsevier Interactive Patient Education  2017 [place] Prevention in the Home Falls can cause injuries. They can happen to people of all ages. There are many things you can do to make your home safe and to help prevent falls. What can I do on the outside of my home? Regularly fix the edges of walkways and driveways and fix any cracks. Remove anything that might make you trip as you walk through a door, such as a raised step or threshold. Trim any bushes or trees on the path to your home. Use bright outdoor lighting. Clear any walking paths of anything that might make someone trip, such as rocks or tools. Regularly check to see if handrails are loose or broken. Make sure that both sides of any steps have handrails. Any raised decks and porches should have guardrails on the edges. Have any leaves, snow, or ice cleared regularly. Use sand or salt on walking paths during winter. Clean up any spills in your garage right away. This includes oil or grease spills. What can I do in the bathroom? Use night lights. Install grab bars by the toilet and in the tub and shower. Do not use towel bars as grab bars. Use non-skid mats or decals in the tub or shower. If you need to sit down in the shower, use a plastic, non-slip stool. Keep the floor dry. Clean up any water that spills on the floor as soon as it happens. Remove soap buildup in the tub or shower regularly. Attach bath mats  securely with double-sided non-slip rug tape. Do not have throw rugs and other things on the floor that can make you trip. What can I do in the bedroom? Use night lights. Make sure that you have a light by your bed that is easy to reach. Do not use any sheets or blankets that are too big for your bed. They should not hang down onto the floor. Have a firm chair that has side arms. You can use this for support while you get dressed. Do not have throw rugs and other things on the floor that can make you trip. What can I do in the kitchen? Clean up any spills right away. Avoid walking on wet floors. Keep items that you use a lot in easy-to-reach places. If you need to reach something above you, use a strong step stool that has a grab bar. Keep electrical cords out of the way. Do not use floor polish or wax that makes floors slippery. If you must use wax, use non-skid floor wax. Do not have throw rugs and other things on the floor that can make you trip. What can I do with my stairs? Do not leave any items on the stairs. Make sure that there are handrails on both sides of the stairs and use them. Fix handrails that are broken or loose. Make sure that handrails are as long as the stairways. Check any carpeting to make sure that it is firmly attached to the  stairs. Fix any carpet that is loose or worn. Avoid having throw rugs at the top or bottom of the stairs. If you do have throw rugs, attach them to the floor with carpet tape. Make sure that you have a light switch at the top of the stairs and the bottom of the stairs. If you do not have them, ask someone to add them for you. What else can I do to help prevent falls? Wear shoes that: Do not have high heels. Have rubber bottoms. Are comfortable and fit you well. Are closed at the toe. Do not wear sandals. If you use a stepladder: Make sure that it is fully opened. Do not climb a closed stepladder. Make sure that both sides of the stepladder  are locked into place. Ask someone to hold it for you, if possible. Clearly mark and make sure that you can see: Any grab bars or handrails. First and last steps. Where the edge of each step is. Use tools that help you move around (mobility aids) if they are needed. These include: Canes. Walkers. Scooters. Crutches. Turn on the lights when you go into a dark area. Replace any light bulbs as soon as they burn out. Set up your furniture so you have a clear path. Avoid moving your furniture around. If any of your floors are uneven, fix them. If there are any pets around you, be aware of where they are. Review your medicines with your doctor. Some medicines can make you feel dizzy. This can increase your chance of falling. Ask your doctor what other things that you can do to help prevent falls. This information is not intended to replace advice given to you by your health care provider. Make sure you discuss any questions you have with your health care provider. Document Released: 05/27/2009 Document Revised: 01/06/2016 Document Reviewed: 09/04/2014 Elsevier Interactive Patient Education  2017 [employer].

## 2022-03-10 ENCOUNTER — Encounter: Payer: Self-pay | Admitting: Hematology and Oncology

## 2022-03-20 ENCOUNTER — Encounter: Payer: Self-pay | Admitting: *Deleted

## 2022-03-20 ENCOUNTER — Ambulatory Visit: Payer: Medicare Other | Admitting: *Deleted

## 2022-03-20 DIAGNOSIS — E785 Hyperlipidemia, unspecified: Secondary | ICD-10-CM

## 2022-03-20 DIAGNOSIS — I10 Essential (primary) hypertension: Secondary | ICD-10-CM

## 2022-03-20 DIAGNOSIS — I5032 Chronic diastolic (congestive) heart failure: Secondary | ICD-10-CM

## 2022-03-20 NOTE — Chronic Care Management (AMB) (Signed)
Care Management    RN Visit Note  03/20/2022 Name: [name] MRN: [id number] DOB: 21-Mar-1956  Subjective: [name] is a 66 y.o. year old male who is a primary care patient of Plotnikov, [name], MD. The care management team was consulted for assistance with disease management and care coordination needs.    Engaged with patient by telephone for follow up visit/ RN CM case closure in response to provider referral for case management and/or care coordination services.   Consent to Services:   [name] was given information about Care Management services 04/21/21 including:  Care Management services includes personalized support from designated clinical staff supervised by his physician, including individualized plan of care and coordination with other care providers 24/7 contact phone numbers for assistance for urgent and routine care needs. The patient may stop case management services at any time by phone call to the office staff.  Patient agreed to services and consent obtained.   Assessment: Review of patient past medical history, allergies, medications, health status, including review of consultants reports, laboratory and other test data, was performed as part of comprehensive evaluation and provision of chronic care management services.   SDOH (Social Determinants of Health) assessments and interventions performed:  SDOH Interventions    Flowsheet Row Most Recent Value  SDOH Interventions   Food Insecurity Interventions Intervention Not Indicated  [continues to deny food insecurity]  Transportation Interventions Intervention Not Indicated  [continues to drive self]     Care Plan  Allergies  Allergen Reactions   Bendamustine Hcl Rash    See hospital notes from 01/12/15   Heparin Other (See Comments)    Opposite reaction Heparin induced thrombocytopenia Other reaction(s): Heparin-Induced Thrombocytopenia   Tape Rash   Outpatient Encounter Medications  as of 03/20/2022  Medication Sig Note   albuterol (PROVENTIL) (2.5 MG/3ML) 0.083% nebulizer solution INHALE 1 VIAL VIA NEBULIZER EVERY DAY AS NEEDED FOR WHEEZING/SHORTNESS OF BREATH    ANORO ELLIPTA 62.5-25 MCG/INH AEPB Inhale 1 puff into the lungs daily as needed. For wheezing & SOB    aspirin EC 81 MG tablet Take 81 mg by mouth every evening.  12/23/2014: .   clonazePAM (KLONOPIN) 0.5 MG tablet TAKE 1 TABLET BY MOUTH TWICE A DAY AS NEEDED    clopidogrel (PLAVIX) 75 MG tablet TAKE 1 TABLET BY MOUTH EVERY DAY WITH BREAKFAST    KLOR-CON M10 10 MEQ tablet Take 10 mEq by mouth daily. Take 1 by mouth daily    metolazone (ZAROXOLYN) 5 MG tablet TAKE 1 TABLET BY MOUTH EVERY DAY 05/06/2021: 05/06/21: Reports no longer taking every day   metoprolol succinate (TOPROL-XL) 50 MG 24 hr tablet Take 1 tablet (50 mg total) by mouth daily. Take with or immediately following a meal.    nitroGLYCERIN (NITROSTAT) 0.4 MG SL tablet Place 1 tablet (0.4 mg total) under the tongue every 5 (five) minutes as needed for chest pain (Up to 3 doses).    oxyCODONE-acetaminophen (PERCOCET) 7.5-325 MG tablet Take 1 tablet by mouth every 6 (six) hours as needed for severe pain.    oxyCODONE-acetaminophen (PERCOCET) 7.5-325 MG tablet Take 1 tablet by mouth every 6 (six) hours as needed for severe pain.    oxyCODONE-acetaminophen (PERCOCET/ROXICET) 5-325 MG tablet Take 1.5 tablets by mouth every 6 (six) hours as needed for severe pain.    oxymetazoline (AFRIN) 0.05 % nasal spray Place 1 spray into both nostrils daily as needed for congestion.    simvastatin (ZOCOR) 20 MG tablet  Take 1 tablet (20 mg total) by mouth at bedtime.    torsemide (DEMADEX) 100 MG tablet Take 1 tablet (100 mg total) by mouth daily. 05/06/2021: 05/06/21: Patient reports taking 2-4 times per week   No facility-administered encounter medications on file as of 03/20/2022.   Patient Active Problem List   Diagnosis Date Noted   History of COVID-19 06/21/2021   Aortic  atherosclerosis ([place]) 12/20/2020   Peripheral neuropathy 06/22/2020   Leukocytosis 04/27/2020   Weight loss 02/17/2020   CAD (coronary artery disease) 12/14/2017   CAD S/P percutaneous coronary angioplasty 12/08/2017   Unstable angina (HCC)    Acute respiratory failure with hypoxia ([place]) 11/15/2017   COPD with acute exacerbation ([place]) 11/15/2017   Port catheter in place 01/12/2017   Skin lesion of back 04/28/2016   Low back pain radiating to both legs 09/28/2015   Boil of upper extremity 08/20/2015   Bilateral leg ulcer ([place]) 06/25/2015   URI (upper respiratory infection) 04/26/2015   Rash 04/26/2015   Basal cell carcinoma of back 04/08/2015   Well adult exam 04/01/2015   Cellulitis of hand, left 02/11/2015   Vasculitis ([place])    Cellulitis of right lower extremity    Erythroderma    Purpura ([place])    RMSF [facility] spotted fever)    Chronic diastolic CHF (congestive heart failure) (HCC)    Thrombocytopenia (HCC)    Hx of CABG    Bilateral lower extremity edema 01/12/2015   Hypersensitivity reaction 01/02/2015   Asymptomatic hyperuricemia 67/28/9791   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   Mass of neck 12/11/2014    Class: Chronic   Morbid obesity ([place]) 12/11/2014   Diabetes mellitus type 2, controlled ([place]) 12/11/2014   Depression 12/11/2014   Neoplasm of uncertain behavior of skin 09/25/2014   Neck mass 07/28/2014   Insomnia 03/25/2014   Erectile dysfunction 09/22/2013   Chronic venous insufficiency 02/25/2013   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 ([place]) 02/25/2013   Anxiety disorder 02/25/2013   Murmur 09/03/2012   Tobacco abuse 12/02/2010   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   G E R D 06/29/2009   ENLARGEMENT OF LYMPH NODES 06/29/2009   HEPARIN-INDUCED THROMBOCYTOPENIA 02/11/2009   Edema 02/11/2009   Carotid disease, bilateral ([place]) 02/11/2009   Elevated lipids 05/23/2007   Essential hypertension 05/23/2007   Sleep apnea 05/23/2007    Conditions to be addressed/monitored: CHF, HTN, and HLD  Care Plan : RN Care Manager Plan of Care  Updates made by Knox Royalty, RN since 03/20/2022 12:00 AM     Problem: Chronic Disease Management Needs   Priority: Medium     Long-Range Goal: Ongoing adherence to established plan of care for long term chronic disease management   Start Date: 05/06/2021  Expected End Date: 05/06/2022  Priority: Medium  Note:   Current Barriers:  Chronic Disease Management support and education needs related to HTN and HLD Chronic pain- limits ability to exercise/ stay active  RNCM Clinical Goal(s):  Patient will demonstrate ongoing health management independence for HTN/ HLD/ CAD  through collaboration with RN Care manager, provider, and care team.   Interventions: 1:1 collaboration with primary care provider regarding development and update of comprehensive plan of care as evidenced by provider attestation and co-signature Inter-disciplinary care team collaboration (see longitudinal plan of care) Evaluation of current treatment plan related to  self management and patient's adherence to plan as established by provider CCM RN CM Initial assessment completed 05/06/21 Review of patient  status, including review of consultants reports, relevant laboratory and other test results, and medications completed SDOH updated: no new/ unmet concerns identified Pain assessment updated: denies acute pain; reports ongoing chronic back pain; states "about the same as it always is;" reports prescribed narcotic medication "works pretty good" in managing pain Falls assessment updated: he continues to deny new/ recent falls x 12 months- continues using cane, "or my walking stick" as/ if needed;  positive reinforcement provided with encouragement to continue efforts at fall prevention; previously provided education around fall risks/ prevention reinforced Reviewed last PCP provider office visit 02/20/22 with patient: he  denies questions, verbalizes good understanding of post- office visit instructions Noted patient cancelled recent cardiology provider appointment 01/20/22: encouraged patient to re-schedule- he states he will do Medications discussed: he reports continues to independently self-manage and denies current concerns/ issues/ questions around medications; endorses adherence to taking all medications as prescribed Reviewed upcoming scheduled provider appointments: 05/11/22- oncology provider follow up/ surveillance; 05/23/22- PCP; patient confirms is aware of all and has plans to attend as scheduled Discussed plans with patient for ongoing care management follow up- patient denies current care coordination/ care management needs and is agreeable to CCM RN CM case closure today; verbalizes understanding to contact PCP or other care providers for any needs that arise in the future, and confirms he has contact information for all care providers     Hyperlipidemia:  (Status: 03/20/22 Goal Met.) Long Term Goal Lab Results  Component Value Date   CHOL 108 01/01/2018   HDL 35 (L) 01/01/2018   [place] 53 01/01/2018   TRIG 98 01/01/2018   CHOLHDL 3.1 01/01/2018    Counseled on importance of regular laboratory monitoring as prescribed; Reviewed role and benefits of statin for ASCVD risk reduction; Reviewed importance of limiting foods high in cholesterol; Assessed social determinant of health barriers;  Confirmed patient continues trying to limit cholesterol in foods- positive reinforcement provided with encouragement to continue efforts Confirmed continues taking statin and ASA as prescribed  CHF/ Hypertension: (Status: 03/20/22: Goal Met.) Long term Goal Last practice recorded BP readings:  BP Readings from Last 3 Encounters:  02/20/22 138/80  11/24/21 (!) 102/50  09/26/21 130/78  Most recent eGFR/CrCl:  Lab Results  Component Value Date   EGFR >90 04/19/2017    No components found for: CRCL  Evaluation  of current treatment plan related to hypertension self management and patient's adherence to plan as established by provider;   Reviewed prescribed diet low salt, low cholesterol/ heart healthy Discussed complications of poorly controlled blood pressure such as heart disease, stroke, circulatory complications, vision complications, kidney impairment, sexual dysfunction;  Confirmed patient still does not monitor/ record daily weights at home- again reports he "occasionally" monitors at home, "a couple of times each week;" discussed ongoing use of diuretic therapy for fluid management-- patient continues to verbalize good understanding of same- reports adherence to prescribed diuretic regimen- takes 2- 3 times per week Reinforced previously provided education around signs/ symptoms CHF yellow zone and corresponding action plan- he is able to verbalize signs/ symptoms CHF yellow zone today as well as associated action plan for same; reports in "green" zone today, denies shortness of breath, swelling outside of baseline and tells me has "been losing weight;" confirmed that his most recent reported weight is "254 lbs," which is about 10 lbs less than his last reported weight: he was encouraged to discuss weight loss with oncology provider at time of upcoming scheduled office visit, which he plans  to do; he also verbalizes plans to discuss lab work with oncologist, stating "I know she will update labs;" we briefly reviewed lab results from PCP office visit 02/20/22 Confirmed patient continues using home O2 "mainly at night;" denies current clinical concerns around breathing status Discussed and provided education around importance of staying as active as possible in setting of HTN/ HLD/ CAD/ CHF: patient remains limited due to chronic pain post- immunotherapy for lymphoma complications-- however, today he tells me he is able to get "some activity" such as lawn mowing and "just takes (his) time;" "I know to take  breaks if I need to" Confirmed no recent use of NTG/ no recent concerns with chest tightness/ pain Confirmed "not having to use inhaler very much;" denies recent issues with shortness of breath unresolved with rest/ use of home O2    Plan:  No further follow up required: patient denies current care coordination/ care management needs and is agreeable to RN CM case closure today; RN CM case closure accordingly     Oneta Rack, RN, BSN, [place] [phone]: direct office

## 2022-04-07 ENCOUNTER — Other Ambulatory Visit: Payer: Self-pay | Admitting: Internal Medicine

## 2022-04-11 NOTE — Progress Notes (Unsigned)
Cardiology Office Note:    Date:  04/12/2022   ID:  [name], [name] [birth year], MRN [id number]  PCP:  [name], MD   [facility] Providers Cardiologist:  Jenkins Rouge, MD     Referring MD: [name], MD   Chief Complaint: follow-up CAD   History of Present Illness:    [name] is a pleasant 66 y.o. male with a hx of carotid artery disease s/p left CEA 2009, CAD s/p CABG 2005, obesity, hypertension, HLD, COPD and follicular lymphoma diagnosed 2016, OSA, and GERD.  Cardiac catheterization 09/29/2011 with patent LIMA to LAD.  Received DES to SVG ramus and DES to SVG PDA at that time.  TTE 11/23/2017 with normal EF of 55 to 14%, normal diastolic parameters and no significant valve disease.  Saw Dr. Johnsie Cancel 11/28/2017 with fatigue, some dyspnea, and right-sided chest pain.  Felt not to be a good candidate for noninvasive testing.  Referred for cardiac cath 12/07/2017 with severe in-stent restenosis of proximal RCA graft.  SVG to OM and LIMA to LAD patent.  Right heart with mean RA 15 mmHg, PA 48/26 mmHg, PCWP 26 mmHg.  Followed by pulmonology for COPD/asbestosis exposure.  Quit smoking. Port-A-Cath previously used for treatment for lymphoma has been removed.  He was last seen in our office by Dr. Johnsie Cancel on 07/26/2020 at which time recommendation to have f/u carotid duplex was scheduled for June 2023. He did not have any symptoms of angina and was advised to return in 1 year for follow-up. Carotid results revealed plaque but no stenosis with recommendation to repeat in 2 years.  Today, he is here alone for overdue follow-up. Has nausea when he wakes up in the mornings. Is seeing oncologist in a few days and is going to request nausea medicine that he took in the past. Feels that his SOB is stable. Has chronic bilateral LE edema and neuropathy. Sleep in a recliner due to better able to breath. No changes in his ability to recline without worsening dyspnea  recently. He denies chest pain fatigue, palpitations, melena, hematuria, hemoptysis, diaphoresis, weakness, presyncope, syncope, orthopnea, and PND. Points to chest and says he has a murmur, occasionally feels a flutter. States he avoids heavy lifting. I question if he is talking about aortic dilatation.   Past Medical History:  Diagnosis Date   Anginal pain ([place])    not had to use in awhile  none in 10 years   Anxiety    Basal cell carcinoma of nose    Blood dyscrasia    trouble clotting    Carotid artery disease ([place])    s/p L CEA 2009 (hx of evacuation of hematoma due to heparin)   CHF (congestive heart failure) (HCC)    Chronic lower back pain    COPD (chronic obstructive pulmonary disease) ([place])    Coronary artery disease    CABG 2005. s/p PTCA and stenting of the saphenous vein graft to PDA and saphenous vein graft to obtuse marginal by Dr. Burt Knack 09/29/11. Normal EF at cath 09/2011   Depression    Diabetes mellitus without complication (HCC)    borderline    Dysrhythmia    fluttering   Edema    GERD (gastroesophageal reflux disease)    Heart murmur    Heparin induced thrombocytopenia (HCC)    History of home oxygen therapy    2 liters at night prn   Hyperlipidemia    Hypertension    Lymphoma ([place])  12/21/2014   Myocardial infarction ([place])    Obesity    Shortness of breath dyspnea    with exertion    Sleep apnea    "used to"      could not use 2006    Past Surgical History:  Procedure Laterality Date   BASAL CELL CARCINOMA EXCISION  2000's   nose   CARDIAC CATHETERIZATION  12/07/2017   CAROTID ENDARTERECTOMY  03/2008   left   CORONARY ANGIOPLASTY WITH STENT PLACEMENT  09/29/11   "2"   CORONARY ARTERY BYPASS GRAFT  2005   CABG X3   CORONARY STENT INTERVENTION  12/07/2017   CORONARY STENT INTERVENTION N/A 12/07/2017   Procedure: CORONARY STENT INTERVENTION;  Surgeon: Burnell Blanks, MD;  Location: [place] [facility];  Service: Cardiovascular;  Laterality: N/A;    evacuation of hematoma  03/2008   left neck; S/P endarterectomy; "cause heparin clotted it up"   I & D EXTREMITY Left 02/11/2015   Procedure: IRRIGATION AND DEBRIDEMENT LEFT LONG FINGER;  Surgeon: Leanora Cover, MD;  Location: [place];  Service: Orthopedics;  Laterality: Left;  I and D Left Long Finger   IR REMOVAL TUN ACCESS W/ PORT W/O FL MOD SED  05/18/2017   MASS EXCISION Left 12/11/2014   Procedure: EXCISIONAL BIOPSY OF LEFT SUPRA CLAVICULAR NECK MASS;  Surgeon: Jerrell Belfast, MD;  Location: [place];  Service: ENT;  Laterality: Left;   PERCUTANEOUS CORONARY STENT INTERVENTION (PCI-S) N/A 09/29/2011   Procedure: PERCUTANEOUS CORONARY STENT INTERVENTION (PCI-S);  Surgeon: Sherren Mocha, MD;  Location: [facility];  Service: Cardiovascular;  Laterality: N/A;   PORTACATH PLACEMENT  2016   RIGHT/LEFT HEART CATH AND CORONARY/GRAFT ANGIOGRAPHY N/A 12/07/2017   Procedure: RIGHT/LEFT HEART CATH AND CORONARY/GRAFT ANGIOGRAPHY;  Surgeon: Burnell Blanks, MD;  Location: [place] [facility];  Service: Cardiovascular;  Laterality: N/A;   SUPERFICIAL LYMPH NODE BIOPSY / EXCISION Left     Current Medications: Current Meds  Medication Sig   albuterol (PROVENTIL) (2.5 MG/3ML) 0.083% nebulizer solution INHALE 1 VIAL VIA NEBULIZER EVERY DAY AS NEEDED FOR WHEEZING/SHORTNESS OF BREATH   amLODipine (NORVASC) 5 MG tablet Take 1 tablet (5 mg total) by mouth daily.   ANORO ELLIPTA 62.5-25 MCG/ACT AEPB USE 1 INHALATION ORALLY    DAILY AS NEEDED FOR        WHEEZING AND SHORTNESS OF  BREATH   aspirin EC 81 MG tablet Take 81 mg by mouth every evening.    clonazePAM (KLONOPIN) 0.5 MG tablet TAKE 1 TABLET BY MOUTH TWICE A DAY AS NEEDED   clopidogrel (PLAVIX) 75 MG tablet TAKE 1 TABLET BY MOUTH EVERY DAY WITH BREAKFAST   KLOR-CON M10 10 MEQ tablet Take 10 mEq by mouth daily. Take 1 by mouth daily   metolazone (ZAROXOLYN) 5 MG tablet TAKE 1 TABLET BY MOUTH EVERY DAY   metoprolol succinate  (TOPROL-XL) 50 MG 24 hr tablet Take 1 tablet (50 mg total) by mouth daily. Take with or immediately following a meal.   oxyCODONE-acetaminophen (PERCOCET) 7.5-325 MG tablet Take 1 tablet by mouth every 6 (six) hours as needed for severe pain.   oxyCODONE-acetaminophen (PERCOCET) 7.5-325 MG tablet Take 1 tablet by mouth every 6 (six) hours as needed for severe pain.   oxyCODONE-acetaminophen (PERCOCET/ROXICET) 5-325 MG tablet Take 1.5 tablets by mouth every 6 (six) hours as needed for severe pain.   oxymetazoline (AFRIN) 0.05 % nasal spray Place 1 spray into both nostrils daily as needed for congestion.  simvastatin (ZOCOR) 20 MG tablet Take 1 tablet (20 mg total) by mouth at bedtime.   torsemide (DEMADEX) 100 MG tablet Take 1 tablet (100 mg total) by mouth daily.     Allergies:   Bendamustine hcl, Heparin, and Tape   Social History   Socioeconomic History   Marital status: Married    Spouse name: Not on file   Number of children: 1   Years of education: Not on file   Highest education level: Not on file  Occupational History   Occupation: Museum/gallery curator  Tobacco Use   Smoking status: Former    Packs/day: 2.00    Years: 41.00    Total pack years: 82.00    Types: Cigarettes    Quit date: 11/23/2017    Years since quitting: 4.3   Smokeless tobacco: Former    Types: Chew   Tobacco comments:    consult entered  Vaping Use   Vaping Use: Never used  Substance and Sexual Activity   Alcohol use: No    Alcohol/week: 0.0 standard drinks of alcohol    Comment: 09/29/11 "quit years ago"   Drug use: Yes    Types: Marijuana    Comment: "last time was in the 1990's"   Sexual activity: Not Currently  Other Topics Concern   Not on file  Social History Narrative   Not on file   Social Determinants of Health   Financial Resource Strain: Low Risk  (03/03/2022)   Overall Financial Resource Strain (CARDIA)    Difficulty of Paying Living Expenses: Not hard at all  Food Insecurity: No Food  Insecurity (03/20/2022)   Hunger Vital Sign    Worried About Running Out of Food in the Last Year: Never true    [place] in the Last Year: Never true  Transportation Needs: No Transportation Needs (03/20/2022)   PRAPARE - Hydrologist (Medical): No    Lack of Transportation (Non-Medical): No  Physical Activity: Sufficiently Active (03/03/2022)   Exercise Vital Sign    Days of Exercise per Week: 5 days    Minutes of Exercise per Session: 30 min  Stress: No Stress Concern Present (03/03/2022)   [place]    Feeling of Stress : Not at all  Social Connections: [place] (03/03/2022)   Social Connection and Isolation Panel [NHANES]    Frequency of Communication with Friends and Family: More than three times a week    Frequency of Social Gatherings with Friends and Family: More than three times a week    Attends Religious Services: More than 4 times per year    Active Member of [employer] or Organizations: Yes    Attends Music therapist: More than 4 times per year    Marital Status: Married     Family History: The patient's family history includes Bone cancer in his mother; Heart disease in his father.  ROS:   Please see the history of present illness.    + chronic DOE + chronic bilateral LE edema All other systems reviewed and are negative.  Labs/Other Studies Reviewed:    The following studies were reviewed today:  Carotid Duplex 02/01/22  Right Carotid: Velocities in the right ICA are consistent with a 1-39%  stenosis.                The ECA appears >50% stenosed.   Left Carotid: Velocities in the left ICA are consistent  with a 1-39%  stenosis.               The ECA appears >50% stenosed. Patent left endartectomy  site.   Vertebrals: Bilateral vertebral arteries demonstrate antegrade flow.  Subclavians: Left subclavian artery was stenotic. Normal flow  hemodynamics  were              seen in the right subclavian artery.   *See table(s) above for measurements and observations.  Suggest follow up study in 12 months.   Echo 11/23/17  - Left ventricle: The cavity size was mildly dilated. There was    moderate concentric hypertrophy. Systolic function was normal.    The estimated ejection fraction was in the range of 55% to 60%.    Wall motion was normal; there were no regional wall motion    abnormalities. Left ventricular diastolic function parameters    were normal.  - Aortic valve: There was trivial regurgitation.  - Aorta: Aortic root dimension: 40 mm (ED).  - Aortic root: The aortic root was mildly dilated.  - Left atrium: The atrium was moderately dilated.  - Right ventricle: The cavity size was mildly dilated. Wall    thickness was normal. Systolic function was mildly reduced.    R/LHC 12/07/17  Prox RCA lesion is 100% stenosed. SVG graft was visualized by angiography and is normal in caliber. Prox Graft lesion is 99% stenosed. A drug-eluting stent was successfully placed using a STENT RESOLUTE ONYX 4.0X22. Post intervention, there is a 0% residual stenosis. Mid LM to Ost LAD lesion is 50% stenosed. Ost 1st Mrg lesion is 100% stenosed. Origin to Prox Graft lesion is 100% stenosed. Ost LAD to Prox LAD lesion is 90% stenosed. Hemodynamic findings consistent with moderate pulmonary hypertension.   1. Severe triple vessel CAD s/p 3V CABG with 2/3 patent bypass grafts 2. The left main has a distal 50% stenosis.  3. The LAD has severe proximal stenosis. The mid and distal LAD fills from the patent LIMA graft.  4. The Circumflex has mild diffuse disease. The intermediate is a large caliber vessel with proximal occlusion. The vein graft to the intermediate branch is occluded in the proximal stent.  5. The RCA is occluded in the proximal segment. The distal vessel fills from the patent vein graft. The stent in the proximal segment  of the vein graft has severe in stent restenosis.  6. Elevated filling pressures   Recommendations: Will continue DAPT with ASA and Plavix. Continue beta blocker and statin. He would benefit from additional diuresis.   Diagnostic Dominance: Right  Intervention      Recent Labs: 09/26/2021: TSH 0.87 02/20/2022: ALT 11; BUN 21; Creatinine, Ser 0.88; Hemoglobin 12.2; Platelets 101.0; Potassium 4.9; Sodium 138  Recent Lipid Panel    Component Value Date/Time   CHOL 108 01/01/2018 0932   TRIG 98 01/01/2018 0932   HDL 35 (L) 01/01/2018 0932   CHOLHDL 3.1 01/01/2018 0932   CHOLHDL 5 03/25/2014 1631   VLDL 37.0 03/25/2014 1631   LDLCALC 53 01/01/2018 0932     Risk Assessment/Calculations:       Physical Exam:    VS:  BP (!) 140/70   Pulse 72   Ht 6' (1.829 m)   Wt 261 lb (118.4 kg)   SpO2 90%   BMI 35.40 kg/m     Wt Readings from Last 3 Encounters:  04/12/22 261 lb (118.4 kg)  02/20/22 254 lb (115.2 kg)  09/26/21 262 lb (118.8 kg)  GEN:  Well developed, obese male in no acute distress HEENT: Normal NECK: No JVD; No carotid bruits CARDIAC: RRR. 2/6 diastolic murmur. No rubs, gallops RESPIRATORY:  Clear to auscultation without rales, wheezing or rhonchi  ABDOMEN: Soft, non-tender, non-distended MUSCULOSKELETAL:  No edema; No deformity. 2+ pedal pulses, equal bilaterally SKIN: Warm and dry NEUROLOGIC:  Alert and oriented x 3 PSYCHIATRIC:  Normal affect   EKG:  EKG is ordered today.  The ekg ordered today demonstrates sinus rhythm at 72 bpm with sinus arrhythmia with 1st degree AV block with PR 248 ms, RBBB, nonspecific ST abnormality  HYPERTENSION CONTROL Vitals:   04/12/22 1101 04/12/22 1122  BP: (!) 160/76 (!) 140/70    The patient's blood pressure is elevated above target today.  In order to address the patient's elevated BP: A new medication was prescribed today.       Diagnoses:    1. Coronary artery disease involving native coronary artery of  native heart without angina pectoris   2. History of CEA (carotid endarterectomy)   3. Carotid artery disease, unspecified laterality, unspecified type ([place])   4. Hyperlipidemia LDL goal <70   5. Essential hypertension   6. Murmur   7. DOE (dyspnea on exertion)   8. Aortic root dilatation (HCC)    Assessment and Plan:     CAD without angina: S/p CABG x 3 2005. Most recent cardiac catheterization 11/2017 revealed 2/3 patent bypass grafts, vein graft to intermediate branch occluded in proximal stent, PCI/DES to SVG to RCA. He denies chest pain, dyspnea, or other symptoms concerning for angina.  No indication for further ischemic evaluation at this time. Continue GDMT including aspirin, clopidogrel, metoprolol, simvastatin.  Chronic DOE: Felt to be currently stable per patient. EF 55-60% on cath 11/2017, elevated filling pressures on right heart cath 11/2017. Likely multifactorial in the setting of COPD, obesity, diastolic dysfunction. He takes metolazone 5 mg and torsemide 100 mg as needed for increased DOE or evidence of fluid retention.  Murmur: 2/6 diastolic murmur on exam. Trivial aortic regurgitation on prior echo 2019. We will repeat echo to evaluate for structural heart disease.   Hypertension: BP is elevated. Does not monitor at home. Reports elevated readings at recent appointments with other providers.  We will add amlodipine 5 mg once daily.  He states he will follow-up with PCP for BP monitoring.  Carotid artery disease: S/p left CEA 2009. Mild bilateral carotid artery disease. Stable on imaging 01/2022, recommendation to repeat duplex in 2 years per primary cardiologist. He is asymptomatic today.   Hyperlipidemia: LDL 53 in 2019. Says he gets frequent lab work with PCP but there is no record of recent lipid. Continue simvastatin.  Aortic root dilatation: Aortic root 40 mm on echo 11/2017.  We will repeat echocardiogram for evaluation. Continue to avoid heavy lifting.       Disposition: 6 months with Dr. Johnsie Cancel  Medication Adjustments/Labs and Tests Ordered: Current medicines are reviewed at length with the patient today.  Concerns regarding medicines are outlined above.  Orders Placed This Encounter  Procedures   EKG 12-Lead   ECHOCARDIOGRAM COMPLETE   Meds ordered this encounter  Medications   amLODipine (NORVASC) 5 MG tablet    Sig: Take 1 tablet (5 mg total) by mouth daily.    Dispense:  30 tablet    Refill:  11    Patient Instructions  Medication Instructions:   START Amlodipine one (1) tablet by mouth ( 5 mg ) daily.   *If  you need a refill on your cardiac medications before your next appointment, please call your pharmacy*   Lab Work:  None   If you have labs (blood work) drawn today and your tests are completely normal, you will receive your results only by: [place] (if you have MyChart) OR A paper copy in the mail If you have any lab test that is abnormal or we need to change your treatment, we will call you to review the results.   Testing/Procedures:  Your physician has requested that you have an echocardiogram. Echocardiography is a painless test that uses sound waves to create images of your heart. It provides your doctor with information about the size and shape of your heart and how well your heart's chambers and valves are working. This procedure takes approximately one hour. There are no restrictions for this procedure.    Follow-Up: At [facility], you and your health needs are our priority.  As part of our continuing mission to provide you with exceptional heart care, we have created designated Provider Care Teams.  These Care Teams include your primary Cardiologist (physician) and Advanced Practice Providers (APPs -  Physician Assistants and Nurse Practitioners) who all work together to provide you with the care you need, when you need it.  We recommend signing up for the patient portal called  "MyChart".  Sign up information is provided on this After Visit Summary.  MyChart is used to connect with patients for Virtual Visits (Telemedicine).  Patients are able to view lab/test results, encounter notes, upcoming appointments, etc.  Non-urgent messages can be sent to your provider as well.   To learn more about what you can do with MyChart, go to [email or web address].    Your next appointment:   6 month(s)  The format for your next appointment:   In Person  Provider:   Jenkins Rouge, MD       Important Information About Sugar         Signed, Lilliona Blakeney, Lanice Schwab, NP  04/12/2022 1:28 PM    [place]

## 2022-04-12 ENCOUNTER — Encounter: Payer: Self-pay | Admitting: Nurse Practitioner

## 2022-04-12 ENCOUNTER — Ambulatory Visit: Payer: Medicare Other | Attending: Nurse Practitioner | Admitting: Nurse Practitioner

## 2022-04-12 VITALS — BP 140/70 | HR 72 | Ht 72.0 in | Wt 261.0 lb

## 2022-04-12 DIAGNOSIS — I779 Disorder of arteries and arterioles, unspecified: Secondary | ICD-10-CM | POA: Insufficient documentation

## 2022-04-12 DIAGNOSIS — E785 Hyperlipidemia, unspecified: Secondary | ICD-10-CM | POA: Diagnosis present

## 2022-04-12 DIAGNOSIS — R0609 Other forms of dyspnea: Secondary | ICD-10-CM | POA: Diagnosis present

## 2022-04-12 DIAGNOSIS — I2510 Atherosclerotic heart disease of native coronary artery without angina pectoris: Secondary | ICD-10-CM | POA: Diagnosis present

## 2022-04-12 DIAGNOSIS — I77810 Thoracic aortic ectasia: Secondary | ICD-10-CM | POA: Diagnosis present

## 2022-04-12 DIAGNOSIS — I10 Essential (primary) hypertension: Secondary | ICD-10-CM | POA: Insufficient documentation

## 2022-04-12 DIAGNOSIS — R011 Cardiac murmur, unspecified: Secondary | ICD-10-CM | POA: Insufficient documentation

## 2022-04-12 DIAGNOSIS — Z9889 Other specified postprocedural states: Secondary | ICD-10-CM | POA: Diagnosis present

## 2022-04-12 MED ORDER — AMLODIPINE BESYLATE 5 MG PO TABS: 5.0000 mg | ORAL_TABLET | Freq: Every day | ORAL | 11 refills | Status: DC

## 2022-04-12 NOTE — Patient Instructions (Addendum)
Medication Instructions:   START Amlodipine one (1) tablet by mouth ( 5 mg ) daily.   *If you need a refill on your cardiac medications before your next appointment, please call your pharmacy*   Lab Work:  None   If you have labs (blood work) drawn today and your tests are completely normal, you will receive your results only by: [place] (if you have MyChart) OR A paper copy in the mail If you have any lab test that is abnormal or we need to change your treatment, we will call you to review the results.   Testing/Procedures:  Your physician has requested that you have an echocardiogram. Echocardiography is a painless test that uses sound waves to create images of your heart. It provides your doctor with information about the size and shape of your heart and how well your heart's chambers and valves are working. This procedure takes approximately one hour. There are no restrictions for this procedure.    Follow-Up: At [facility], you and your health needs are our priority.  As part of our continuing mission to provide you with exceptional heart care, we have created designated Provider Care Teams.  These Care Teams include your primary Cardiologist (physician) and Advanced Practice Providers (APPs -  Physician Assistants and Nurse Practitioners) who all work together to provide you with the care you need, when you need it.  We recommend signing up for the patient portal called "MyChart".  Sign up information is provided on this After Visit Summary.  MyChart is used to connect with patients for Virtual Visits (Telemedicine).  Patients are able to view lab/test results, encounter notes, upcoming appointments, etc.  Non-urgent messages can be sent to your provider as well.   To learn more about what you can do with MyChart, go to [email or web address].    Your next appointment:   6 month(s)  The format for your next appointment:   In Person  Provider:   Jenkins Rouge, MD       Important Information About Sugar

## 2022-04-25 ENCOUNTER — Ambulatory Visit (HOSPITAL_COMMUNITY): Payer: Medicare Other | Attending: Cardiology

## 2022-04-25 DIAGNOSIS — I10 Essential (primary) hypertension: Secondary | ICD-10-CM | POA: Insufficient documentation

## 2022-04-25 DIAGNOSIS — I779 Disorder of arteries and arterioles, unspecified: Secondary | ICD-10-CM | POA: Diagnosis present

## 2022-04-25 DIAGNOSIS — E785 Hyperlipidemia, unspecified: Secondary | ICD-10-CM

## 2022-04-25 DIAGNOSIS — Z9889 Other specified postprocedural states: Secondary | ICD-10-CM | POA: Diagnosis present

## 2022-04-25 DIAGNOSIS — I2510 Atherosclerotic heart disease of native coronary artery without angina pectoris: Secondary | ICD-10-CM | POA: Diagnosis present

## 2022-04-25 DIAGNOSIS — R011 Cardiac murmur, unspecified: Secondary | ICD-10-CM

## 2022-04-25 LAB — ECHOCARDIOGRAM COMPLETE
Area-P 1/2: 5.13 cm2
S' Lateral: 4.3 cm

## 2022-05-01 ENCOUNTER — Telehealth: Payer: Medicare Other

## 2022-05-11 ENCOUNTER — Other Ambulatory Visit: Payer: Self-pay

## 2022-05-11 ENCOUNTER — Inpatient Hospital Stay: Payer: Medicare Other | Attending: Hematology and Oncology

## 2022-05-11 ENCOUNTER — Encounter: Payer: Self-pay | Admitting: Hematology and Oncology

## 2022-05-11 ENCOUNTER — Inpatient Hospital Stay (HOSPITAL_BASED_OUTPATIENT_CLINIC_OR_DEPARTMENT_OTHER): Payer: Medicare Other | Admitting: Hematology and Oncology

## 2022-05-11 VITALS — BP 135/53 | HR 80 | Temp 98.0°F | Resp 18 | Ht 72.0 in | Wt 234.8 lb

## 2022-05-11 DIAGNOSIS — C8228 Follicular lymphoma grade III, unspecified, lymph nodes of multiple sites: Secondary | ICD-10-CM

## 2022-05-11 DIAGNOSIS — Z8579 Personal history of other malignant neoplasms of lymphoid, hematopoietic and related tissues: Secondary | ICD-10-CM | POA: Diagnosis not present

## 2022-05-11 DIAGNOSIS — Z79899 Other long term (current) drug therapy: Secondary | ICD-10-CM | POA: Insufficient documentation

## 2022-05-11 DIAGNOSIS — K219 Gastro-esophageal reflux disease without esophagitis: Secondary | ICD-10-CM | POA: Insufficient documentation

## 2022-05-11 DIAGNOSIS — K5909 Other constipation: Secondary | ICD-10-CM

## 2022-05-11 DIAGNOSIS — R634 Abnormal weight loss: Secondary | ICD-10-CM | POA: Diagnosis not present

## 2022-05-11 DIAGNOSIS — K5900 Constipation, unspecified: Secondary | ICD-10-CM | POA: Insufficient documentation

## 2022-05-11 LAB — CBC WITH DIFFERENTIAL/PLATELET
Abs Immature Granulocytes: 0.03 10*3/uL (ref 0.00–0.07)
Basophils Absolute: 0.1 10*3/uL (ref 0.0–0.1)
Basophils Relative: 2 %
Eosinophils Absolute: 0.3 10*3/uL (ref 0.0–0.5)
Eosinophils Relative: 4 %
HCT: 38.1 % — ABNORMAL LOW (ref 39.0–52.0)
Hemoglobin: 12.6 g/dL — ABNORMAL LOW (ref 13.0–17.0)
Immature Granulocytes: 0 %
Lymphocytes Relative: 9 %
Lymphs Abs: 0.6 10*3/uL — ABNORMAL LOW (ref 0.7–4.0)
MCH: 33.9 pg (ref 26.0–34.0)
MCHC: 33.1 g/dL (ref 30.0–36.0)
MCV: 102.4 fL — ABNORMAL HIGH (ref 80.0–100.0)
Monocytes Absolute: 0.9 10*3/uL (ref 0.1–1.0)
Monocytes Relative: 13 %
Neutro Abs: 4.9 10*3/uL (ref 1.7–7.7)
Neutrophils Relative %: 72 %
Platelets: 140 10*3/uL — ABNORMAL LOW (ref 150–400)
RBC: 3.72 MIL/uL — ABNORMAL LOW (ref 4.22–5.81)
RDW: 13.5 % (ref 11.5–15.5)
WBC: 6.8 10*3/uL (ref 4.0–10.5)
nRBC: 0 % (ref 0.0–0.2)

## 2022-05-11 LAB — COMPREHENSIVE METABOLIC PANEL
ALT: 13 U/L (ref 0–44)
AST: 29 U/L (ref 15–41)
Albumin: 4.6 g/dL (ref 3.5–5.0)
Alkaline Phosphatase: 92 U/L (ref 38–126)
Anion gap: 8 (ref 5–15)
BUN: 18 mg/dL (ref 8–23)
CO2: 38 mmol/L — ABNORMAL HIGH (ref 22–32)
Calcium: 9.8 mg/dL (ref 8.9–10.3)
Chloride: 94 mmol/L — ABNORMAL LOW (ref 98–111)
Creatinine, Ser: 0.88 mg/dL (ref 0.61–1.24)
GFR, Estimated: >60 mL/min (ref >60)
Glucose, Bld: 116 mg/dL — ABNORMAL HIGH (ref 70–99)
Potassium: 4.8 mmol/L (ref 3.5–5.1)
Sodium: 140 mmol/L (ref 135–145)
Total Bilirubin: 0.9 mg/dL (ref 0.3–1.2)
Total Protein: 7.8 g/dL (ref 6.5–8.1)

## 2022-05-11 MED ORDER — OMEPRAZOLE 40 MG PO CPDR: 40.0000 mg | DELAYED_RELEASE_CAPSULE | Freq: Every day | ORAL | 3 refills | Status: DC

## 2022-05-11 NOTE — Assessment & Plan Note (Signed)
I am very concerned about his unexplained 25 pounds weight loss over the course of a month Due to his prior history of lymphoma and significant smoking history, I am most concerned about either recurrent lymphoma or secondary malignancy I will order urgent imaging study next week for further evaluation

## 2022-05-11 NOTE — Progress Notes (Signed)
[place] OFFICE PROGRESS NOTE  Patient Care Team: Plotnikov, [name], MD as PCP - General (Internal Medicine) [name], MD as PCP - Cardiology (Cardiology) [name], MD as Consulting Physician (Cardiology) Heath Lark, MD as Consulting Physician (Hematology and Oncology) Jerrell Belfast, MD as Consulting Physician (Otolaryngology) Daryll Brod, MD as Consulting Physician (Orthopedic Surgery) Brand Males, MD as Consulting Physician (Pulmonary Disease)  ASSESSMENT & PLAN:  Follicular lymphoma ([place]) I am very concerned about his unexplained 25 pounds weight loss over the course of a month Due to his prior history of lymphoma and significant smoking history, I am most concerned about either recurrent lymphoma or secondary malignancy I will order urgent imaging study next week for further evaluation  Weight loss, abnormal He has unexplained weight loss Due to his prior heavy smoking history and lymphoma, I am most concerned about undiagnosed malignancy The patient has never had colonoscopy before He has some reflux symptoms I plan to order CT imaging as soon as possible for evaluation  GERD (gastroesophageal reflux disease) He has sensation of reflux I recommend a trial of pantoprazole or omeprazole I will send GI referral after CT imaging results are available  Other constipation He has chronic constipation and has never had colonoscopy I recommend CT imaging for evaluation and if that is unremarkable, I will refer him to GI  Orders Placed This Encounter  Procedures   CT CHEST ABDOMEN PELVIS W CONTRAST    Standing Status:   Future    Standing Expiration Date:   05/12/2023    Order Specific Question:   Preferred imaging location?    Answer:   [facility]    Order Specific Question:   Radiology Contrast Protocol - do NOT remove file path    Answer:   [email or web address]    All questions were  answered. The patient knows to call the clinic with any problems, questions or concerns. The total time spent in the appointment was 40 minutes encounter with patients including review of chart and various tests results, discussions about plan of care and coordination of care plan   Heath Lark, MD 05/11/2022 10:00 AM  INTERVAL HISTORY: Please see below for problem oriented charting. he returns for surveillance follow-up for history of lymphoma Since last time I saw him, he has lost tremendous amount of weight Over the past month alone, he has lost over 25 pounds He has chronic intermittent nausea, indigestion sensation and bloating He has reduced appetite overall and on average only eats once a day He has chronic constipation for very long time and has never had screening colonoscopy He quit smoking approximately a year ago He has occasional scattered wheezes but denies chest pain or shortness of breath except on moderate exertion No history of hemoptysis He denies problems with chewing food or dysphagia No new lymphadenopathy  REVIEW OF SYSTEMS:   Constitutional: Denies fevers, chills  Eyes: Denies blurriness of vision Ears, nose, mouth, throat, and face: Denies mucositis or sore throat Respiratory: Denies cough, dyspnea or wheezes Cardiovascular: Denies palpitation, chest discomfort or lower extremity swelling Skin: Denies abnormal skin rashes Lymphatics: Denies new lymphadenopathy or easy bruising Neurological:Denies numbness, tingling or new weaknesses Behavioral/Psych: Mood is stable, no new changes  All other systems were reviewed with the patient and are negative.  I have reviewed the past medical history, past surgical history, social history and family history with the patient and they are unchanged from previous note.  ALLERGIES:  is allergic to bendamustine  hcl, heparin, and tape.  MEDICATIONS:  Current Outpatient Medications  Medication Sig Dispense Refill    omeprazole (PRILOSEC) 40 MG capsule Take 1 capsule (40 mg total) by mouth daily. 30 capsule 3   albuterol (PROVENTIL) (2.5 MG/3ML) 0.083% nebulizer solution INHALE 1 VIAL VIA NEBULIZER EVERY DAY AS NEEDED FOR WHEEZING/SHORTNESS OF BREATH 75 mL 5   amLODipine (NORVASC) 5 MG tablet Take 1 tablet (5 mg total) by mouth daily. 30 tablet 11   ANORO ELLIPTA 62.5-25 MCG/ACT AEPB USE 1 INHALATION ORALLY    DAILY AS NEEDED FOR        WHEEZING AND SHORTNESS OF  BREATH 180 each 2   aspirin EC 81 MG tablet Take 81 mg by mouth every evening.      clonazePAM (KLONOPIN) 0.5 MG tablet TAKE 1 TABLET BY MOUTH TWICE A DAY AS NEEDED 60 tablet 3   clopidogrel (PLAVIX) 75 MG tablet TAKE 1 TABLET BY MOUTH EVERY DAY WITH BREAKFAST 90 tablet 3   KLOR-CON M10 10 MEQ tablet Take 10 mEq by mouth daily. Take 1 by mouth daily     metolazone (ZAROXOLYN) 5 MG tablet TAKE 1 TABLET BY MOUTH EVERY DAY 30 tablet 11   metoprolol succinate (TOPROL-XL) 50 MG 24 hr tablet Take 1 tablet (50 mg total) by mouth daily. Take with or immediately following a meal. 90 tablet 3   nitroGLYCERIN (NITROSTAT) 0.4 MG SL tablet Place 1 tablet (0.4 mg total) under the tongue every 5 (five) minutes as needed for chest pain (Up to 3 doses). 25 tablet 1   oxyCODONE-acetaminophen (PERCOCET) 7.5-325 MG tablet Take 1 tablet by mouth every 6 (six) hours as needed for severe pain. 120 tablet 0   oxymetazoline (AFRIN) 0.05 % nasal spray Place 1 spray into both nostrils daily as needed for congestion.     simvastatin (ZOCOR) 20 MG tablet Take 1 tablet (20 mg total) by mouth at bedtime. 90 tablet 3   torsemide (DEMADEX) 100 MG tablet Take 1 tablet (100 mg total) by mouth daily. 90 tablet 3   No current facility-administered medications for this visit.    SUMMARY OF ONCOLOGIC HISTORY: Oncology History Overview Note  FLIPI score of 2; stage 3 and areas of involvement >4   Follicular lymphoma ([place])  06/24/2009 Imaging   PET CT scan showed two small FDG positive  left neck nodes   12/11/2014 Surgery   He underwent excisional lymph node biopsy of the left supraclavicular lymph node/neck region   12/11/2014 Pathology Results   Accession: [id number] biopsy show follicular lymphoma   8/76/8115 Imaging   ECHO showed LVH but preserved EF   12/25/2014 Imaging   PET scan showed disease above and below diaphragm   12/28/2014 Procedure   He has port placement   12/31/2014 - 01/01/2015 Chemotherapy   He received 1 cycle of bendamustine with rituximab, discontinued due to suspicion of allergic reaction to bendamustine   01/12/2015 - 01/19/2015 Hospital Admission   He was admitted to the hospital with suspicious allergic reaction, significant bilateral lower extremity edema with cellulitis, pneumonia and mild fluid overload   03/18/2015 - 04/08/2015 Chemotherapy   Treatment is switched to rituximab, Cytoxan, vincristine and prednisone   04/29/2015 Imaging    PET CT scan showed near complete response to treatment.   04/30/2015 - 01/12/2017 Chemotherapy   He received maintenance Rituxan   11/03/2016 PET scan   No metabolic evidence of recurrent lymphoma. Mild patchy marrow hypermetabolism throughout the axial skeleton, unchanged, probably due to  a mildly reactive marrow state. 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   04/19/2017 Imaging   1. No evidence for lymphadenopathy in the chest, abdomen, or pelvis. 2.  Aortic Atherosclerois (ICD10-170.0) 3. 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 4. Bilateral nonobstructing nephrolithiasis. 5. Cholelithiasis. 6. Hepatic steatosis   05/18/2017 Procedure   Successful  removal of implanted Port-A-Cath     PHYSICAL EXAMINATION: ECOG PERFORMANCE STATUS: 1 - Symptomatic but completely ambulatory  Vitals:   05/11/22 0809  BP: (!) 135/53  Pulse: 80  Resp: 18  Temp: 98 F (36.7 C)  SpO2: 98%   Filed Weights   05/11/22 0809  Weight: 234 lb 12.8 oz (106.5 kg)    GENERAL:alert, no distress and comfortable.  He looks cachectic despite his body habitus with signs of muscle wasting SKIN: skin color, texture, turgor are normal, no rashes or significant lesions EYES: normal, Conjunctiva are pink and non-injected, sclera clear OROPHARYNX:no exudate, no erythema and lips, buccal mucosa, and tongue normal  NECK: supple, thyroid normal size, non-tender, without nodularity LYMPH:  no palpable lymphadenopathy in the cervical, axillary or inguinal LUNGS: He has diffuse expiratory wheezes HEART: regular rate & rhythm and no murmurs and no lower extremity edema ABDOMEN:abdomen soft, non-tender and normal bowel sounds Musculoskeletal:no cyanosis of digits and no clubbing  NEURO: alert & oriented x 3 with fluent speech, no focal motor/sensory deficits  LABORATORY DATA:  I have reviewed the data as listed    Component Value Date/Time   NA 140 05/11/2022 0758   NA 141 01/01/2018 0932   NA 141 04/19/2017 0822   K 4.8 05/11/2022 0758   K 4.1 04/19/2017 0822   CL 94 (L) 05/11/2022 0758   CO2 38 (H) 05/11/2022 0758   CO2 33 (H) 04/19/2017 0822   GLUCOSE 116 (H) 05/11/2022 0758   GLUCOSE 113 04/19/2017 0822   BUN 18 05/11/2022 0758   BUN 14 01/01/2018 0932   BUN 8.7 04/19/2017 0822   CREATININE 0.88 05/11/2022 0758   CREATININE 0.87 07/25/2018 1442   CREATININE 0.7 04/19/2017 0822   CALCIUM 9.8 05/11/2022 0758   CALCIUM 9.5 04/19/2017 0822   PROT 7.8 05/11/2022 0758   PROT 7.1 01/01/2018 0932   PROT 6.8 04/19/2017 0822   ALBUMIN 4.6 05/11/2022 0758   ALBUMIN 4.6 01/01/2018 0932   ALBUMIN 3.8 04/19/2017 0822   AST 29 05/11/2022 0758   AST 16 07/25/2018  1442   AST 18 04/19/2017 0822   ALT 13 05/11/2022 0758   ALT 15 07/25/2018 1442   ALT 21 04/19/2017 0822   ALKPHOS 92 05/11/2022 0758   ALKPHOS 82 04/19/2017 0822   BILITOT 0.9 05/11/2022 0758   BILITOT 0.3 07/25/2018 1442   BILITOT 0.53 04/19/2017 0822   GFRNONAA >60 05/11/2022 0758   GFRNONAA >60 07/25/2018 1442   GFRAA >60 04/27/2020 1327   GFRAA >60 07/25/2018 1442    No results found for: "SPEP", "UPEP"  Lab Results  Component Value Date   WBC 6.8 05/11/2022   NEUTROABS 4.9 05/11/2022   HGB 12.6 (L) 05/11/2022   HCT 38.1 (L) 05/11/2022   MCV 102.4 (H) 05/11/2022   PLT 140 (L) 05/11/2022      Chemistry      Component Value Date/Time   NA 140 05/11/2022 0758  NA 141 01/01/2018 0932   NA 141 04/19/2017 0822   K 4.8 05/11/2022 0758   K 4.1 04/19/2017 0822   CL 94 (L) 05/11/2022 0758   CO2 38 (H) 05/11/2022 0758   CO2 33 (H) 04/19/2017 0822   BUN 18 05/11/2022 0758   BUN 14 01/01/2018 0932   BUN 8.7 04/19/2017 0822   CREATININE 0.88 05/11/2022 0758   CREATININE 0.87 07/25/2018 1442   CREATININE 0.7 04/19/2017 0822      Component Value Date/Time   CALCIUM 9.8 05/11/2022 0758   CALCIUM 9.5 04/19/2017 0822   ALKPHOS 92 05/11/2022 0758   ALKPHOS 82 04/19/2017 0822   AST 29 05/11/2022 0758   AST 16 07/25/2018 1442   AST 18 04/19/2017 0822   ALT 13 05/11/2022 0758   ALT 15 07/25/2018 1442   ALT 21 04/19/2017 0822   BILITOT 0.9 05/11/2022 0758   BILITOT 0.3 07/25/2018 1442   BILITOT 0.53 04/19/2017 1610       RADIOGRAPHIC STUDIES: I have personally reviewed the radiological images as listed and agreed with the findings in the report. ECHOCARDIOGRAM COMPLETE  Result Date: 04/25/2022    ECHOCARDIOGRAM REPORT   Patient Name:   [name] Date of Exam: 04/25/2022 Medical Rec #:  [id number]           Height:       72.0 in Accession #:    [id number]          Weight:       261.0 lb Date of Birth:  1956/04/13           BSA:          2.386 m Patient Age:     32 years            BP:           140/70 mmHg Patient Gender: M                   HR:           76 bpm. Exam Location:  [place] Procedure: 2D Echo, Cardiac Doppler and Color Doppler Indications:    R01.1 Murmur  History:        Patient has prior history of Echocardiogram examinations, most                 recent 11/23/2017. CHF, CAD and Previous Myocardial Infarction,                 Prior CABG, COPD, Signs/Symptoms:Murmur and Edema; Risk                 Factors:Hypertension and Dyslipidemia.  Sonographer:    Cresenciano Lick RDCS Referring Phys: [place]  1. Left ventricular ejection fraction, by estimation, is 60 to 65%. The left ventricle has normal function. The left ventricle has no regional wall motion abnormalities. There is mild left ventricular hypertrophy. Left ventricular diastolic parameters are consistent with Grade II diastolic dysfunction (pseudonormalization).  2. Right ventricular systolic function is normal. The right ventricular size is mildly enlarged. There is moderately elevated pulmonary artery systolic pressure. The estimated right ventricular systolic pressure is 62.1 mmHg.  3. Left atrial size was mildly dilated.  4. Right atrial size was mildly dilated.  5. The mitral valve is normal in structure. Mild mitral valve regurgitation. No evidence of mitral stenosis.  6. Tricuspid valve regurgitation is mild to moderate.  7. The aortic valve is normal in  structure. Aortic valve regurgitation is not visualized. No aortic stenosis is present.  8. The inferior vena cava is normal in size with greater than 50% respiratory variability, suggesting right atrial pressure of 3 mmHg. FINDINGS  Left Ventricle: Left ventricular ejection fraction, by estimation, is 60 to 65%. The left ventricle has normal function. The left ventricle has no regional wall motion abnormalities. The left ventricular internal cavity size was normal in size. There is  mild left ventricular  hypertrophy. Abnormal (paradoxical) septal motion, consistent with left bundle branch block. Left ventricular diastolic parameters are consistent with Grade II diastolic dysfunction (pseudonormalization). Right Ventricle: The right ventricular size is mildly enlarged. No increase in right ventricular wall thickness. Right ventricular systolic function is normal. There is moderately elevated pulmonary artery systolic pressure. The tricuspid regurgitant velocity is 3.33 m/s, and with an assumed right atrial pressure of 3 mmHg, the estimated right ventricular systolic pressure is 38.7 mmHg. Left Atrium: Left atrial size was mildly dilated. Right Atrium: Right atrial size was mildly dilated. Pericardium: There is no evidence of pericardial effusion. Mitral Valve: The mitral valve is normal in structure. Mild mitral valve regurgitation. No evidence of mitral valve stenosis. Tricuspid Valve: The tricuspid valve is normal in structure. Tricuspid valve regurgitation is mild to moderate. No evidence of tricuspid stenosis. Aortic Valve: The aortic valve is normal in structure. Aortic valve regurgitation is not visualized. No aortic stenosis is present. Pulmonic Valve: The pulmonic valve was normal in structure. Pulmonic valve regurgitation is trivial. No evidence of pulmonic stenosis. Aorta: The aortic root is normal in size and structure. Venous: The inferior vena cava is normal in size with greater than 50% respiratory variability, suggesting right atrial pressure of 3 mmHg. IAS/Shunts: No atrial level shunt detected by color flow Doppler.  LEFT VENTRICLE PLAX 2D LVIDd:         5.50 cm   Diastology LVIDs:         4.30 cm   LV e' medial:    5.00 cm/s LV PW:         1.30 cm   LV E/e' medial:  27.3 LV IVS:        1.10 cm   LV e' lateral:   11.00 cm/s LVOT diam:     2.50 cm   LV E/e' lateral: 12.4 LV SV:         84 LV SV Index:   35 LVOT Area:     4.91 cm  RIGHT VENTRICLE             IVC RV Basal diam:  4.90 cm     IVC diam:  3.10 cm RV S prime:     11.30 cm/s TAPSE (M-mode): 2.2 cm LEFT ATRIUM             Index        RIGHT ATRIUM           Index LA diam:        5.60 cm 2.35 cm/m   RA Area:     22.20 cm LA Vol (A2C):   93.6 ml 39.23 ml/m  RA Volume:   71.40 ml  29.93 ml/m LA Vol (A4C):   87.6 ml 36.72 ml/m LA Biplane Vol: 94.4 ml 39.57 ml/m  AORTIC VALVE LVOT Vmax:   82.95 cm/s LVOT Vmean:  60.750 cm/s LVOT VTI:    0.171 m  AORTA Ao Root diam: 3.90 cm Ao Asc diam:  3.20 cm MITRAL VALVE  TRICUSPID VALVE MV Area (PHT): 5.13 cm     TR Peak grad:   44.4 mmHg MV Decel Time: 148 msec     TR Vmax:        333.00 cm/s MV E velocity: 136.50 cm/s MV A velocity: 49.30 cm/s   SHUNTS MV E/A ratio:  2.77         Systemic VTI:  0.17 m                             Systemic Diam: 2.50 cm Candee Furbish MD Electronically signed by Candee Furbish MD Signature Date/Time: 04/25/2022/3:34:56 PM    Final

## 2022-05-11 NOTE — Assessment & Plan Note (Signed)
He has unexplained weight loss Due to his prior heavy smoking history and lymphoma, I am most concerned about undiagnosed malignancy The patient has never had colonoscopy before He has some reflux symptoms I plan to order CT imaging as soon as possible for evaluation

## 2022-05-11 NOTE — Assessment & Plan Note (Signed)
He has chronic constipation and has never had colonoscopy I recommend CT imaging for evaluation and if that is unremarkable, I will refer him to GI

## 2022-05-11 NOTE — Assessment & Plan Note (Signed)
He has sensation of reflux I recommend a trial of pantoprazole or omeprazole I will send GI referral after CT imaging results are available

## 2022-05-15 ENCOUNTER — Ambulatory Visit (HOSPITAL_BASED_OUTPATIENT_CLINIC_OR_DEPARTMENT_OTHER)
Admission: RE | Admit: 2022-05-15 | Discharge: 2022-05-15 | Disposition: A | Discharge status: Home / Self Care | Payer: Medicare Other | Source: Ambulatory Visit | Attending: Hematology and Oncology | Admitting: Hematology and Oncology

## 2022-05-15 ENCOUNTER — Encounter: Payer: Self-pay | Admitting: Internal Medicine

## 2022-05-15 DIAGNOSIS — C8228 Follicular lymphoma grade III, unspecified, lymph nodes of multiple sites: Secondary | ICD-10-CM | POA: Insufficient documentation

## 2022-05-15 MED ORDER — IOHEXOL 300 MG/ML  SOLN
100.0000 mL | Freq: Once | INTRAMUSCULAR | Status: AC | PRN
  Administered 2022-05-15: 85 mL via INTRAVENOUS

## 2022-05-16 MED ORDER — TORSEMIDE 100 MG PO TABS: 100.0000 mg | ORAL_TABLET | Freq: Every day | ORAL | 0 refills | Status: DC

## 2022-05-18 ENCOUNTER — Telehealth: Payer: Self-pay

## 2022-05-18 ENCOUNTER — Encounter: Payer: Self-pay | Admitting: Hematology and Oncology

## 2022-05-18 ENCOUNTER — Other Ambulatory Visit: Payer: Self-pay

## 2022-05-18 ENCOUNTER — Inpatient Hospital Stay: Payer: Medicare Other | Attending: Hematology and Oncology | Admitting: Hematology and Oncology

## 2022-05-18 VITALS — BP 135/55 | HR 79 | Temp 97.4°F | Resp 18 | Ht 72.0 in | Wt 231.8 lb

## 2022-05-18 DIAGNOSIS — K5909 Other constipation: Secondary | ICD-10-CM | POA: Insufficient documentation

## 2022-05-18 DIAGNOSIS — Z87891 Personal history of nicotine dependence: Secondary | ICD-10-CM | POA: Diagnosis not present

## 2022-05-18 DIAGNOSIS — R590 Localized enlarged lymph nodes: Secondary | ICD-10-CM | POA: Diagnosis not present

## 2022-05-18 DIAGNOSIS — Z8579 Personal history of other malignant neoplasms of lymphoid, hematopoietic and related tissues: Secondary | ICD-10-CM | POA: Diagnosis present

## 2022-05-18 DIAGNOSIS — C8228 Follicular lymphoma grade III, unspecified, lymph nodes of multiple sites: Secondary | ICD-10-CM

## 2022-05-18 DIAGNOSIS — R599 Enlarged lymph nodes, unspecified: Secondary | ICD-10-CM | POA: Insufficient documentation

## 2022-05-18 DIAGNOSIS — R634 Abnormal weight loss: Secondary | ICD-10-CM | POA: Insufficient documentation

## 2022-05-18 DIAGNOSIS — Z79899 Other long term (current) drug therapy: Secondary | ICD-10-CM | POA: Diagnosis not present

## 2022-05-18 NOTE — Telephone Encounter (Signed)
Faxed referral to Dr. Benson Norway at [phone]. Received fax confirmation.

## 2022-05-18 NOTE — Progress Notes (Signed)
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[facility]) I have reviewed CT imaging with the patient and his wife He has nonspecific enlarged lymph node in the mediastinum No other significant lymphadenopathy that could explain his profound weight loss Due to his history of heavy smoking, I recommend referral back to pulmonologist for evaluation and possible biopsy From the lymphoma standpoint, he have no signs of cancer recurrence I plan to see him in a year for further follow-up  Weight loss, abnormal His appetite has improved since recent resolution of severe constipation secondary to oral contrast Recommend increase oral intake as tolerated  Other constipation He has severe constipation despite daily use of laxatives I told him that CT imaging is not appropriate test to screen for colon cancer With his recent weight loss, I think it is important for GI referral The patient also have significant reflux symptoms I recommend EGD and colonoscopy evaluation In the meantime, he will continue aggressive laxative therapy  Orders Placed This Encounter  Procedures   Ambulatory referral to Gastroenterology    Referral Priority:   Routine    Referral Type:   Consultation    Referral Reason:   Specialty Services Required    Referred to Provider:   Carol Ada, MD    Requested Specialty:   Gastroenterology    Number of Visits Requested:   1   Ambulatory referral to Pulmonology    Referral Priority:    Routine    Referral Type:   Consultation    Referral Reason:   Specialty Services Required    Referred to Provider:   Brand Males, MD    Requested Specialty:   Pulmonary Disease    Number of Visits Requested:   1    All questions were answered. The patient knows to call the clinic with any problems, questions or concerns. The total time spent in the appointment was 40 minutes encounter with patients including review of chart and various tests results, discussions about plan of care and coordination of care plan   Heath Lark, MD 05/18/2022 2:42 PM  INTERVAL HISTORY: Please see below for problem oriented charting. he returns for review of test results It took 2 days before he had significant bowel movement after contrast CT.  Since then, he felt better He has been taking laxative every day His appetite has improved but he has not gained weight We spent majority of our time reviewing test results  REVIEW OF SYSTEMS:   Constitutional: Denies fevers, chills  Eyes: Denies blurriness of vision Ears, nose, mouth, throat, and face: Denies mucositis or sore throat Respiratory: Denies cough, dyspnea or wheezes Cardiovascular: Denies palpitation, chest discomfort or lower extremity swelling Skin: Denies abnormal skin rashes Lymphatics: Denies new lymphadenopathy or easy bruising Neurological:Denies numbness, tingling or new weaknesses Behavioral/Psych: Mood is stable, no new changes  All other systems were reviewed with the patient and are negative.  I have reviewed the past medical history, past surgical history, social history and family history with the patient and they are unchanged from previous note.  ALLERGIES:  is allergic to bendamustine hcl,  heparin, and tape.  MEDICATIONS:  Current Outpatient Medications  Medication Sig Dispense Refill   albuterol (PROVENTIL) (2.5 MG/3ML) 0.083% nebulizer solution INHALE 1 VIAL VIA NEBULIZER EVERY DAY AS NEEDED FOR WHEEZING/SHORTNESS OF  BREATH 75 mL 5   amLODipine (NORVASC) 5 MG tablet Take 1 tablet (5 mg total) by mouth daily. 30 tablet 11   ANORO ELLIPTA 62.5-25 MCG/ACT AEPB USE 1 INHALATION ORALLY    DAILY AS NEEDED FOR        WHEEZING AND SHORTNESS OF  BREATH 180 each 2   aspirin EC 81 MG tablet Take 81 mg by mouth every evening.      clonazePAM (KLONOPIN) 0.5 MG tablet TAKE 1 TABLET BY MOUTH TWICE A DAY AS NEEDED 60 tablet 3   clopidogrel (PLAVIX) 75 MG tablet TAKE 1 TABLET BY MOUTH EVERY DAY WITH BREAKFAST 90 tablet 3   KLOR-CON M10 10 MEQ tablet Take 10 mEq by mouth daily. Take 1 by mouth daily     metolazone (ZAROXOLYN) 5 MG tablet TAKE 1 TABLET BY MOUTH EVERY DAY 30 tablet 11   metoprolol succinate (TOPROL-XL) 50 MG 24 hr tablet Take 1 tablet (50 mg total) by mouth daily. Take with or immediately following a meal. 90 tablet 3   nitroGLYCERIN (NITROSTAT) 0.4 MG SL tablet Place 1 tablet (0.4 mg total) under the tongue every 5 (five) minutes as needed for chest pain (Up to 3 doses). 25 tablet 1   omeprazole (PRILOSEC) 40 MG capsule Take 1 capsule (40 mg total) by mouth daily. 30 capsule 3   oxyCODONE-acetaminophen (PERCOCET) 7.5-325 MG tablet Take 1 tablet by mouth every 6 (six) hours as needed for severe pain. 120 tablet 0   oxymetazoline (AFRIN) 0.05 % nasal spray Place 1 spray into both nostrils daily as needed for congestion.     simvastatin (ZOCOR) 20 MG tablet Take 1 tablet (20 mg total) by mouth at bedtime. 90 tablet 3   torsemide (DEMADEX) 100 MG tablet Take 1 tablet (100 mg total) by mouth daily. 90 tablet 0   No current facility-administered medications for this visit.    SUMMARY OF ONCOLOGIC HISTORY: Oncology History Overview Note  FLIPI score of 2; stage 3 and areas of involvement >4   Follicular lymphoma ([place])  06/24/2009 Imaging   PET CT scan showed two small FDG positive left neck nodes   12/11/2014 Surgery   He underwent excisional lymph node biopsy of the left supraclavicular lymph node/neck  region   12/11/2014 Pathology Results   Accession: [id number] biopsy show follicular lymphoma   3/71/0626 Imaging   ECHO showed LVH but preserved EF   12/25/2014 Imaging   PET scan showed disease above and below diaphragm   12/28/2014 Procedure   He has port placement   12/31/2014 - 01/01/2015 Chemotherapy   He received 1 cycle of bendamustine with rituximab, discontinued due to suspicion of allergic reaction to bendamustine   01/12/2015 - 01/19/2015 Hospital Admission   He was admitted to the hospital with suspicious allergic reaction, significant bilateral lower extremity edema with cellulitis, pneumonia and mild fluid overload   03/18/2015 - 04/08/2015 Chemotherapy   Treatment is switched to rituximab, Cytoxan, vincristine and prednisone   04/29/2015 Imaging    PET CT scan showed near complete response to treatment.   04/30/2015 - 01/12/2017 Chemotherapy   He received maintenance Rituxan   11/03/2016 PET scan   No metabolic evidence of recurrent lymphoma. Mild patchy marrow hypermetabolism throughout the axial skeleton, unchanged, probably due to a  mildly reactive marrow state. 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   04/19/2017 Imaging   1. No evidence for lymphadenopathy in the chest, abdomen, or pelvis. 2.  Aortic Atherosclerois (ICD10-170.0) 3. 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 4. Bilateral nonobstructing nephrolithiasis. 5. Cholelithiasis. 6. Hepatic steatosis   05/18/2017 Procedure   Successful removal of implanted Port-A-Cath   05/16/2022 Imaging   1. Newly enlarged pretracheal lymph nodes measuring up to 1.8 x 1.3  cm , with additional newly prominent subcentimeter mediastinal and retroperitoneal lymph nodes in comparison to most recent examination is dated 2018. These are nonspecific and possibly reactive, however lymphomatous recurrence is not excluded and further surveillance or metabolic characterization by PET-CT may be considered. 2. Emphysema and diffuse bilateral bronchial wall thickening. 3. 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 4. Cardiomegaly and coronary artery disease. 5. Cirrhosis. 6. Cholelithiasis. 7. Bilateral nonobstructive nephrolithiasis.       PHYSICAL EXAMINATION: ECOG PERFORMANCE STATUS: 1 - Symptomatic but completely ambulatory  Vitals:   05/18/22 1333  BP: (!) 135/55  Pulse: 79  Resp: 18  Temp: (!) 97.4 F (36.3 C)  SpO2: 97%   Filed Weights   05/18/22 1333  Weight: 231 lb 12.8 oz (105.1 kg)    GENERAL:alert, no distress and comfortable NEURO: alert & oriented x 3 with fluent speech, no focal motor/sensory deficits  LABORATORY DATA:  I have reviewed the data as listed    Component Value Date/Time   NA 140 05/11/2022 0758   NA 141 01/01/2018 0932   NA 141 04/19/2017 0822   K 4.8 05/11/2022 0758   K 4.1 04/19/2017 0822   CL 94 (L) 05/11/2022 0758   CO2 38 (H) 05/11/2022 0758   CO2 33 (H) 04/19/2017 0822   GLUCOSE 116 (H) 05/11/2022 0758   GLUCOSE 113 04/19/2017 0822   BUN 18 05/11/2022 0758   BUN 14 01/01/2018 0932   BUN 8.7 04/19/2017 0822   CREATININE 0.88 05/11/2022 0758   CREATININE 0.87 07/25/2018 1442   CREATININE 0.7 04/19/2017 0822   CALCIUM 9.8 05/11/2022 0758   CALCIUM 9.5 04/19/2017 0822   PROT 7.8 05/11/2022 0758   PROT 7.1 01/01/2018 0932   PROT 6.8 04/19/2017 0822   ALBUMIN 4.6 05/11/2022 0758   ALBUMIN 4.6 01/01/2018 0932   ALBUMIN 3.8 04/19/2017 0822   AST 29 05/11/2022 0758   AST 16 07/25/2018 1442   AST 18 04/19/2017 0822   ALT 13 05/11/2022 0758   ALT 15 07/25/2018 1442   ALT 21  04/19/2017 0822   ALKPHOS 92 05/11/2022 0758   ALKPHOS 82 04/19/2017 0822   BILITOT 0.9 05/11/2022 0758   BILITOT 0.3 07/25/2018 1442   BILITOT 0.53 04/19/2017 0822   GFRNONAA >60 05/11/2022 0758   GFRNONAA >60 07/25/2018 1442   GFRAA >60 04/27/2020 1327   GFRAA >60 07/25/2018 1442    No results found for: "SPEP", "UPEP"  Lab Results  Component Value Date   WBC 6.8 05/11/2022   NEUTROABS 4.9 05/11/2022   HGB 12.6 (L) 05/11/2022   HCT 38.1 (L) 05/11/2022   MCV 102.4 (H) 05/11/2022   PLT 140 (L) 05/11/2022      Chemistry      Component Value Date/Time   NA 140 05/11/2022 0758   NA 141  01/01/2018 0932   NA 141 04/19/2017 0822   K 4.8 05/11/2022 0758   K 4.1 04/19/2017 0822   CL 94 (L) 05/11/2022 0758   CO2 38 (H) 05/11/2022 0758   CO2 33 (H) 04/19/2017 0822   BUN 18 05/11/2022 0758   BUN 14 01/01/2018 0932   BUN 8.7 04/19/2017 0822   CREATININE 0.88 05/11/2022 0758   CREATININE 0.87 07/25/2018 1442   CREATININE 0.7 04/19/2017 0822      Component Value Date/Time   CALCIUM 9.8 05/11/2022 0758   CALCIUM 9.5 04/19/2017 0822   ALKPHOS 92 05/11/2022 0758   ALKPHOS 82 04/19/2017 0822   AST 29 05/11/2022 0758   AST 16 07/25/2018 1442   AST 18 04/19/2017 0822   ALT 13 05/11/2022 0758   ALT 15 07/25/2018 1442   ALT 21 04/19/2017 0822   BILITOT 0.9 05/11/2022 0758   BILITOT 0.3 07/25/2018 1442   BILITOT 0.53 04/19/2017 0822       RADIOGRAPHIC STUDIES: I have reviewed multiple CT imaging with the patient and his wife I have personally reviewed the radiological images as listed and agreed with the findings in the report. CT CHEST ABDOMEN PELVIS W CONTRAST  Result Date: 05/16/2022 CLINICAL DATA:  Hematologic malignancy, weight loss, history of lymphoma * Tracking Code: BO * EXAM: CT CHEST, ABDOMEN, AND PELVIS WITH CONTRAST TECHNIQUE: Multidetector CT imaging of the chest, abdomen and pelvis was performed following the standard protocol during bolus administration of  intravenous contrast. RADIATION DOSE REDUCTION: This exam was performed according to the departmental dose-optimization program which includes automated exposure control, adjustment of the mA and/or kV according to patient size and/or use of iterative reconstruction technique. CONTRAST:  20m OMNIPAQUE IOHEXOL 300 MG/ML SOLN additional oral enteric contrast COMPARISON:  CT chest, 05/03/2017, CT chest abdomen pelvis, 04/19/2017 FINDINGS: CT CHEST FINDINGS Cardiovascular: Aortic atherosclerosis. 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 No pericardial effusion. Mediastinum/Nodes: Newly enlarged pretracheal lymph nodes measuring up to 1.8 x 1.3 cm (series 2, image 120), with additional newly prominent subcentimeter prevascular and AP window nodes (series 2, image 19). Thyroid gland, trachea, and esophagus demonstrate no significant findings. Lungs/Pleura: Mild centrilobular emphysema and diffuse bilateral bronchial wall thickening. Calcified bilateral pleural plaques with associated bandlike scarring and or atelectasis at the lung bases. No pleural effusion or pneumothorax. Musculoskeletal: Bilateral gynecomastia.  No acute osseous findings. CT ABDOMEN PELVIS FINDINGS Hepatobiliary: No solid liver abnormality is seen. Coarse, nodular cirrhotic morphology of the liver. Gallstones. No gallbladder wall thickening or biliary ductal dilatation. Pancreas: Unremarkable. No pancreatic ductal dilatation or surrounding inflammatory changes. Spleen: Normal in size without significant abnormality. Adrenals/Urinary Tract: Adrenal glands are unremarkable. Multiple bilateral nonobstructive renal calculi. No ureteral calculi or hydronephrosis. Multiple simple, benign bilateral renal cortical cysts and subcentimeter lesions too small to characterize although almost certainly additional cysts, for which no further follow-up or characterization is required. Bladder is  unremarkable. Stomach/Bowel: Stomach is within normal limits. Appendix appears normal. No evidence of bowel wall thickening, distention, or inflammatory changes. Vascular/Lymphatic: Aortic atherosclerosis. Newly prominent subcentimeter retroperitoneal lymph nodes, index aortocaval node measuring 1.0 x 0.8 cm (series 2, image 81). Reproductive: No mass or other abnormality. Other: No abdominal wall hernia or abnormality. No ascites. Musculoskeletal: No acute osseous findings. IMPRESSION: 1. Newly enlarged pretracheal lymph nodes measuring up to 1.8 x 1.3 cm , with additional newly prominent subcentimeter mediastinal and retroperitoneal lymph nodes in comparison to most recent examination is dated 2018.  These are nonspecific and possibly reactive, however lymphomatous recurrence is not excluded and further surveillance or metabolic characterization by PET-CT may be considered. 2. Emphysema and diffuse bilateral bronchial wall thickening. 3. 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 4. Cardiomegaly and coronary artery disease. 5. Cirrhosis. 6. Cholelithiasis. 7. Bilateral nonobstructive nephrolithiasis. Aortic Atherosclerosis (ICD10-I70.0) and Emphysema (ICD10-J43.9). Electronically Signed   By: Delanna Ahmadi M.D.   On: 05/16/2022 15:30   ECHOCARDIOGRAM COMPLETE  Result Date: 04/25/2022    ECHOCARDIOGRAM REPORT   Patient Name:   DESHAN HEMMELGARN Date of Exam: 04/25/2022 Medical Rec #:  [id number]           Height:       72.0 in Accession #:    [id number]          Weight:       261.0 lb Date of Birth:  1955-11-07           BSA:          2.386 m Patient Age:    66 years            BP:           140/70 mmHg Patient Gender: M                   HR:           76 bpm. Exam Location:  [place] Procedure: 2D Echo, Cardiac Doppler and Color Doppler Indications:    R01.1 Murmur  History:        Patient has prior history of Echocardiogram examinations, most                 recent  11/23/2017. CHF, CAD and Previous Myocardial Infarction,                 Prior CABG, COPD, Signs/Symptoms:Murmur and Edema; Risk                 Factors:Hypertension and Dyslipidemia.  Sonographer:    Cresenciano Lick RDCS Referring Phys: [place]  1. Left ventricular ejection fraction, by estimation, is 60 to 65%. The left ventricle has normal function. The left ventricle has no regional wall motion abnormalities. There is mild left ventricular hypertrophy. Left ventricular diastolic parameters are consistent with Grade II diastolic dysfunction (pseudonormalization).  2. Right ventricular systolic function is normal. The right ventricular size is mildly enlarged. There is moderately elevated pulmonary artery systolic pressure. The estimated right ventricular systolic pressure is 68.3 mmHg.  3. Left atrial size was mildly dilated.  4. Right atrial size was mildly dilated.  5. The mitral valve is normal in structure. Mild mitral valve regurgitation. No evidence of mitral stenosis.  6. Tricuspid valve regurgitation is mild to moderate.  7. The aortic valve is normal in structure. Aortic valve regurgitation is not visualized. No aortic stenosis is present.  8. The inferior vena cava is normal in size with greater than 50% respiratory variability, suggesting right atrial pressure of 3 mmHg. FINDINGS  Left Ventricle: Left ventricular ejection fraction, by estimation, is 60 to 65%. The left ventricle has normal function. The left ventricle has no regional wall motion abnormalities. The left ventricular internal cavity size was normal in size. There is  mild left ventricular hypertrophy. Abnormal (paradoxical) septal motion, consistent with left bundle branch block. Left ventricular diastolic parameters are consistent with Grade II diastolic dysfunction (pseudonormalization). Right Ventricle: The right ventricular size is mildly enlarged. No  increase in right ventricular wall thickness.  Right ventricular systolic function is normal. There is moderately elevated pulmonary artery systolic pressure. The tricuspid regurgitant velocity is 3.33 m/s, and with an assumed right atrial pressure of 3 mmHg, the estimated right ventricular systolic pressure is 70.1 mmHg. Left Atrium: Left atrial size was mildly dilated. Right Atrium: Right atrial size was mildly dilated. Pericardium: There is no evidence of pericardial effusion. Mitral Valve: The mitral valve is normal in structure. Mild mitral valve regurgitation. No evidence of mitral valve stenosis. Tricuspid Valve: The tricuspid valve is normal in structure. Tricuspid valve regurgitation is mild to moderate. No evidence of tricuspid stenosis. Aortic Valve: The aortic valve is normal in structure. Aortic valve regurgitation is not visualized. No aortic stenosis is present. Pulmonic Valve: The pulmonic valve was normal in structure. Pulmonic valve regurgitation is trivial. No evidence of pulmonic stenosis. Aorta: The aortic root is normal in size and structure. Venous: The inferior vena cava is normal in size with greater than 50% respiratory variability, suggesting right atrial pressure of 3 mmHg. IAS/Shunts: No atrial level shunt detected by color flow Doppler.  LEFT VENTRICLE PLAX 2D LVIDd:         5.50 cm   Diastology LVIDs:         4.30 cm   LV e' medial:    5.00 cm/s LV PW:         1.30 cm   LV E/e' medial:  27.3 LV IVS:        1.10 cm   LV e' lateral:   11.00 cm/s LVOT diam:     2.50 cm   LV E/e' lateral: 12.4 LV SV:         84 LV SV Index:   35 LVOT Area:     4.91 cm  RIGHT VENTRICLE             IVC RV Basal diam:  4.90 cm     IVC diam: 3.10 cm RV S prime:     11.30 cm/s TAPSE (M-mode): 2.2 cm LEFT ATRIUM             Index        RIGHT ATRIUM           Index LA diam:        5.60 cm 2.35 cm/m   RA Area:     22.20 cm LA Vol (A2C):   93.6 ml 39.23 ml/m  RA Volume:   71.40 ml  29.93 ml/m LA Vol (A4C):   87.6 ml 36.72 ml/m LA Biplane Vol: 94.4 ml  39.57 ml/m  AORTIC VALVE LVOT Vmax:   82.95 cm/s LVOT Vmean:  60.750 cm/s LVOT VTI:    0.171 m  AORTA Ao Root diam: 3.90 cm Ao Asc diam:  3.20 cm MITRAL VALVE                TRICUSPID VALVE MV Area (PHT): 5.13 cm     TR Peak grad:   44.4 mmHg MV Decel Time: 148 msec     TR Vmax:        333.00 cm/s MV E velocity: 136.50 cm/s MV A velocity: 49.30 cm/s   SHUNTS MV E/A ratio:  2.77         Systemic VTI:  0.17 m                             Systemic Diam: 2.50 cm Elta Guadeloupe  Skains MD Electronically signed by Candee Furbish MD Signature Date/Time: 04/25/2022/3:34:56 PM    Final

## 2022-05-18 NOTE — Telephone Encounter (Signed)
-----   Message from Heath Lark, MD sent at 05/18/2022  2:43 PM EDT ----- Regarding: please send GI referral to Dr. Benson Norway

## 2022-05-18 NOTE — Assessment & Plan Note (Signed)
I have reviewed CT imaging with the patient and his wife He has nonspecific enlarged lymph node in the mediastinum No other significant lymphadenopathy that could explain his profound weight loss Due to his history of heavy smoking, I recommend referral back to pulmonologist for evaluation and possible biopsy From the lymphoma standpoint, he have no signs of cancer recurrence I plan to see him in a year for further follow-up

## 2022-05-18 NOTE — Assessment & Plan Note (Signed)
He has severe constipation despite daily use of laxatives I told him that CT imaging is not appropriate test to screen for colon cancer With his recent weight loss, I think it is important for GI referral The patient also have significant reflux symptoms I recommend EGD and colonoscopy evaluation In the meantime, he will continue aggressive laxative therapy

## 2022-05-18 NOTE — Assessment & Plan Note (Signed)
His appetite has improved since recent resolution of severe constipation secondary to oral contrast Recommend increase oral intake as tolerated

## 2022-05-23 ENCOUNTER — Ambulatory Visit (INDEPENDENT_AMBULATORY_CARE_PROVIDER_SITE_OTHER): Payer: Medicare Other | Admitting: Internal Medicine

## 2022-05-23 ENCOUNTER — Encounter: Payer: Self-pay | Admitting: Internal Medicine

## 2022-05-23 DIAGNOSIS — C8228 Follicular lymphoma grade III, unspecified, lymph nodes of multiple sites: Secondary | ICD-10-CM | POA: Diagnosis not present

## 2022-05-23 DIAGNOSIS — I2510 Atherosclerotic heart disease of native coronary artery without angina pectoris: Secondary | ICD-10-CM

## 2022-05-23 DIAGNOSIS — I5032 Chronic diastolic (congestive) heart failure: Secondary | ICD-10-CM | POA: Diagnosis not present

## 2022-05-23 DIAGNOSIS — R634 Abnormal weight loss: Secondary | ICD-10-CM | POA: Diagnosis not present

## 2022-05-23 DIAGNOSIS — I2583 Coronary atherosclerosis due to lipid rich plaque: Secondary | ICD-10-CM

## 2022-05-23 DIAGNOSIS — J449 Chronic obstructive pulmonary disease, unspecified: Secondary | ICD-10-CM

## 2022-05-23 MED ORDER — CLONAZEPAM 0.5 MG PO TABS
ORAL_TABLET | ORAL | 3 refills | Status: DC
Start: 2022-05-23 — End: 2022-07-24

## 2022-05-23 MED ORDER — OXYCODONE-ACETAMINOPHEN 7.5-325 MG PO TABS: 1.0000 | ORAL_TABLET | Freq: Four times a day (QID) | ORAL | 0 refills | Status: DC | PRN

## 2022-05-23 NOTE — Assessment & Plan Note (Signed)
10/23: Per Dr Alvy Bimler  - He has nonspecific enlarged lymph node in the mediastinum No other significant lymphadenopathy that could explain his profound weight loss Due to his history of heavy smoking, I recommend referral back to pulmonologist for evaluation and possible biopsy From the lymphoma standpoint, he have no signs of cancer recurrence I plan to see him in a year for further follow-up

## 2022-05-23 NOTE — Assessment & Plan Note (Signed)
F/u w/Dr [name] Caller

## 2022-05-23 NOTE — Assessment & Plan Note (Signed)
NAS diet, on O2 l/min [place] - at night On torsemide, hydralazine, Zaroxolyn d/c

## 2022-05-23 NOTE — Assessment & Plan Note (Addendum)
Treat constipation Better now Good appetite Chart/tests reviewed Not smoking or drinking

## 2022-05-23 NOTE — Progress Notes (Signed)
Subjective:  Patient ID: [name], male    DOB: 1956-02-24  Age: 66 y.o. MRN: [id number]  CC: Follow-up (3 MONTH F/U)   HPI [name] presents for wt loss - he was 260 lbs, LBP/OA, CHF, anxiety f/u  Not smoking or drinking  Outpatient Medications Prior to Visit  Medication Sig Dispense Refill   albuterol (PROVENTIL) (2.5 MG/3ML) 0.083% nebulizer solution INHALE 1 VIAL VIA NEBULIZER EVERY DAY AS NEEDED FOR WHEEZING/SHORTNESS OF BREATH 75 mL 5   amLODipine (NORVASC) 5 MG tablet Take 1 tablet (5 mg total) by mouth daily. 30 tablet 11   ANORO ELLIPTA 62.5-25 MCG/ACT AEPB USE 1 INHALATION ORALLY    DAILY AS NEEDED FOR        WHEEZING AND SHORTNESS OF  BREATH 180 each 2   aspirin EC 81 MG tablet Take 81 mg by mouth every evening.      clopidogrel (PLAVIX) 75 MG tablet TAKE 1 TABLET BY MOUTH EVERY DAY WITH BREAKFAST 90 tablet 3   KLOR-CON M10 10 MEQ tablet Take 10 mEq by mouth daily. Take 1 by mouth daily     metolazone (ZAROXOLYN) 5 MG tablet TAKE 1 TABLET BY MOUTH EVERY DAY 30 tablet 11   metoprolol succinate (TOPROL-XL) 50 MG 24 hr tablet Take 1 tablet (50 mg total) by mouth daily. Take with or immediately following a meal. 90 tablet 3   omeprazole (PRILOSEC) 40 MG capsule Take 1 capsule (40 mg total) by mouth daily. 30 capsule 3   oxymetazoline (AFRIN) 0.05 % nasal spray Place 1 spray into both nostrils daily as needed for congestion.     simvastatin (ZOCOR) 20 MG tablet Take 1 tablet (20 mg total) by mouth at bedtime. 90 tablet 3   torsemide (DEMADEX) 100 MG tablet Take 1 tablet (100 mg total) by mouth daily. 90 tablet 0   clonazePAM (KLONOPIN) 0.5 MG tablet TAKE 1 TABLET BY MOUTH TWICE A DAY AS NEEDED 60 tablet 3   oxyCODONE-acetaminophen (PERCOCET) 7.5-325 MG tablet Take 1 tablet by mouth every 6 (six) hours as needed for severe pain. 120 tablet 0   nitroGLYCERIN (NITROSTAT) 0.4 MG SL tablet Place 1 tablet (0.4 mg total) under the tongue every 5 (five) minutes as  needed for chest pain (Up to 3 doses). 25 tablet 1   No facility-administered medications prior to visit.    ROS: Review of Systems  Constitutional:  Positive for unexpected weight change. Negative for appetite change, fatigue and fever.  HENT:  Negative for congestion, nosebleeds, sneezing, sore throat and trouble swallowing.   Eyes:  Negative for itching and visual disturbance.  Respiratory:  Positive for shortness of breath. Negative for cough and wheezing.   Cardiovascular:  Negative for chest pain, palpitations and leg swelling.  Gastrointestinal:  Negative for abdominal distention, blood in stool, diarrhea and nausea.  Genitourinary:  Negative for frequency and hematuria.  Musculoskeletal:  Positive for arthralgias, back pain and gait problem. Negative for joint swelling and neck pain.  Skin:  Positive for color change. Negative for rash.  Neurological:  Negative for dizziness, tremors, speech difficulty and weakness.  Psychiatric/Behavioral:  Negative for agitation, dysphoric mood, sleep disturbance and suicidal ideas. The patient is nervous/anxious.     Objective:  BP (!) 140/58 (BP Location: Left Arm)   Pulse 76   Temp 98.7 F (37.1 C) (Oral)   Ht 6' (1.829 m)   Wt 234 lb 9.6 oz (106.4 kg)   SpO2 93%   BMI  31.82 kg/m   BP Readings from Last 3 Encounters:  05/23/22 (!) 140/58  05/18/22 (!) 135/55  05/11/22 (!) 135/53    Wt Readings from Last 3 Encounters:  05/23/22 234 lb 9.6 oz (106.4 kg)  05/18/22 231 lb 12.8 oz (105.1 kg)  05/11/22 234 lb 12.8 oz (106.5 kg)    Physical Exam Constitutional:      General: He is not in acute distress.    Appearance: He is well-developed. He is obese.     Comments: NAD  Eyes:     Conjunctiva/sclera: Conjunctivae normal.     Pupils: Pupils are equal, round, and reactive to light.  Neck:     Thyroid: No thyromegaly.     Vascular: No JVD.  Cardiovascular:     Rate and Rhythm: Normal rate and regular rhythm.     Heart  sounds: Normal heart sounds. No murmur heard.    No friction rub. No gallop.  Pulmonary:     Effort: Pulmonary effort is normal. No respiratory distress.     Breath sounds: Normal breath sounds. No wheezing or rales.  Chest:     Chest wall: No tenderness.  Abdominal:     General: Bowel sounds are normal. There is no distension.     Palpations: Abdomen is soft. There is no mass.     Tenderness: There is no abdominal tenderness. There is no guarding or rebound.  Musculoskeletal:        General: Tenderness present. Normal range of motion.     Cervical back: Normal range of motion.     Right lower leg: Edema present.     Left lower leg: Edema present.  Lymphadenopathy:     Cervical: No cervical adenopathy.  Skin:    General: Skin is warm and dry.     Findings: No rash.  Neurological:     Mental Status: He is alert and oriented to person, place, and time.     Cranial Nerves: No cranial nerve deficit.     Motor: No abnormal muscle tone.     Coordination: Coordination normal.     Gait: Gait normal.     Deep Tendon Reflexes: Reflexes are normal and symmetric.  Psychiatric:        Behavior: Behavior normal.        Thought Content: Thought content normal.        Judgment: Judgment normal.     Lab Results  Component Value Date   WBC 6.8 05/11/2022   HGB 12.6 (L) 05/11/2022   HCT 38.1 (L) 05/11/2022   PLT 140 (L) 05/11/2022   GLUCOSE 116 (H) 05/11/2022   CHOL 108 01/01/2018   TRIG 98 01/01/2018   HDL 35 (L) 01/01/2018   LDLCALC 53 01/01/2018   ALT 13 05/11/2022   AST 29 05/11/2022   NA 140 05/11/2022   K 4.8 05/11/2022   CL 94 (L) 05/11/2022   CREATININE 0.88 05/11/2022   BUN 18 05/11/2022   CO2 38 (H) 05/11/2022   TSH 0.87 09/26/2021   PSA 0.23 09/15/2011   INR 1.0 11/28/2017   HGBA1C 5.2 09/26/2021   MICROALBUR 0.7 03/29/2016    CT CHEST ABDOMEN PELVIS W CONTRAST  Result Date: 05/16/2022 CLINICAL DATA:  Hematologic malignancy, weight loss, history of lymphoma *  Tracking Code: BO * EXAM: CT CHEST, ABDOMEN, AND PELVIS WITH CONTRAST TECHNIQUE: Multidetector CT imaging of the chest, abdomen and pelvis was performed following the standard protocol during bolus administration of intravenous contrast. RADIATION DOSE REDUCTION:  This exam was performed according to the departmental dose-optimization program which includes automated exposure control, adjustment of the mA and/or kV according to patient size and/or use of iterative reconstruction technique. CONTRAST:  59m OMNIPAQUE IOHEXOL 300 MG/ML SOLN additional oral enteric contrast COMPARISON:  CT chest, 05/03/2017, CT chest abdomen pelvis, 04/19/2017 FINDINGS: CT CHEST FINDINGS Cardiovascular: Aortic atherosclerosis. 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 No pericardial effusion. Mediastinum/Nodes: Newly enlarged pretracheal lymph nodes measuring up to 1.8 x 1.3 cm (series 2, image 120), with additional newly prominent subcentimeter prevascular and AP window nodes (series 2, image 19). Thyroid gland, trachea, and esophagus demonstrate no significant findings. Lungs/Pleura: Mild centrilobular emphysema and diffuse bilateral bronchial wall thickening. Calcified bilateral pleural plaques with associated bandlike scarring and or atelectasis at the lung bases. No pleural effusion or pneumothorax. Musculoskeletal: Bilateral gynecomastia.  No acute osseous findings. CT ABDOMEN PELVIS FINDINGS Hepatobiliary: No solid liver abnormality is seen. Coarse, nodular cirrhotic morphology of the liver. Gallstones. No gallbladder wall thickening or biliary ductal dilatation. Pancreas: Unremarkable. No pancreatic ductal dilatation or surrounding inflammatory changes. Spleen: Normal in size without significant abnormality. Adrenals/Urinary Tract: Adrenal glands are unremarkable. Multiple bilateral nonobstructive renal calculi. No ureteral calculi or hydronephrosis. Multiple  simple, benign bilateral renal cortical cysts and subcentimeter lesions too small to characterize although almost certainly additional cysts, for which no further follow-up or characterization is required. Bladder is unremarkable. Stomach/Bowel: Stomach is within normal limits. Appendix appears normal. No evidence of bowel wall thickening, distention, or inflammatory changes. Vascular/Lymphatic: Aortic atherosclerosis. Newly prominent subcentimeter retroperitoneal lymph nodes, index aortocaval node measuring 1.0 x 0.8 cm (series 2, image 81). Reproductive: No mass or other abnormality. Other: No abdominal wall hernia or abnormality. No ascites. Musculoskeletal: No acute osseous findings. IMPRESSION: 1. Newly enlarged pretracheal lymph nodes measuring up to 1.8 x 1.3 cm , with additional newly prominent subcentimeter mediastinal and retroperitoneal lymph nodes in comparison to most recent examination is dated 2018. These are nonspecific and possibly reactive, however lymphomatous recurrence is not excluded and further surveillance or metabolic characterization by PET-CT may be considered. 2. Emphysema and diffuse bilateral bronchial wall thickening. 3. 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 4. Cardiomegaly and coronary artery disease. 5. Cirrhosis. 6. Cholelithiasis. 7. Bilateral nonobstructive nephrolithiasis. Aortic Atherosclerosis (ICD10-I70.0) and Emphysema (ICD10-J43.9). Electronically Signed   By: ADelanna AhmadiM.D.   On: 05/16/2022 15:30    Assessment & Plan:   Problem List Items Addressed This Visit     Chronic diastolic CHF (congestive heart failure) (HCC) (Chronic)    NAS diet, on O2 l/min [place] - at night On torsemide, hydralazine, Zaroxolyn d/c      [place]    F/u w/Dr RChase Caller     F[place]licular [place] ([place]    10[place]23: Per Dr GAlvy Bimler - He has nonspecific enlarged lymph node in the mediastinum No other significant lymphadenopathy that  could explain his profound weight loss Due to his history of heavy smoking, I recommend referral back to pulmonologist for evaluation and possible biopsy From the lymphoma standpoint, he have no signs of cancer recurrence I plan to see him in a year for further follow-up      Relevant Medications   clonazePAM (KLONOPIN) 0.5 MG tablet   oxyCODONE-acetaminophen (PERCOCET) 7.5-325 MG tablet   oxyCODONE-acetaminophen (PERCOCET) 7.5-325 MG tablet   Weight loss, abnormal    Treat constipation Better now Good appetite Chart/tests reviewed Not smoking or drinking  Meds ordered this encounter  Medications   clonazePAM (KLONOPIN) 0.5 MG tablet    Sig: TAKE 1 TABLET BY MOUTH TWICE A DAY AS NEEDED    Dispense:  60 tablet    Refill:  3   oxyCODONE-acetaminophen (PERCOCET) 7.5-325 MG tablet    Sig: Take 1 tablet by mouth every 6 (six) hours as needed for severe pain.    Dispense:  120 tablet    Refill:  0    Please fill on or after 06/17/22   oxyCODONE-acetaminophen (PERCOCET) 7.5-325 MG tablet    Sig: Take 1 tablet by mouth every 6 (six) hours as needed for severe pain.    Dispense:  120 tablet    Refill:  0    Please fill on or after 07/17/22      Follow-up: Return in about 3 months (around 08/23/2022) for a follow-up visit.  Walker Kehr, MD

## 2022-05-25 ENCOUNTER — Other Ambulatory Visit: Payer: Self-pay | Admitting: Internal Medicine

## 2022-05-25 MED ORDER — TORSEMIDE 100 MG PO TABS: 100.0000 mg | ORAL_TABLET | Freq: Every day | ORAL | 1 refills | Status: DC

## 2022-06-01 ENCOUNTER — Encounter: Payer: Self-pay | Admitting: Internal Medicine

## 2022-06-01 ENCOUNTER — Other Ambulatory Visit: Payer: Self-pay | Admitting: Internal Medicine

## 2022-06-01 ENCOUNTER — Ambulatory Visit (INDEPENDENT_AMBULATORY_CARE_PROVIDER_SITE_OTHER): Payer: Medicare Other | Admitting: Internal Medicine

## 2022-06-01 VITALS — BP 134/72 | HR 78 | Temp 98.3°F | Ht 72.0 in | Wt 231.0 lb

## 2022-06-01 DIAGNOSIS — J449 Chronic obstructive pulmonary disease, unspecified: Secondary | ICD-10-CM

## 2022-06-01 DIAGNOSIS — J920 Pleural plaque with presence of asbestos: Secondary | ICD-10-CM

## 2022-06-01 DIAGNOSIS — R590 Localized enlarged lymph nodes: Secondary | ICD-10-CM | POA: Diagnosis not present

## 2022-06-01 DIAGNOSIS — I2583 Coronary atherosclerosis due to lipid rich plaque: Secondary | ICD-10-CM | POA: Diagnosis not present

## 2022-06-01 DIAGNOSIS — I2510 Atherosclerotic heart disease of native coronary artery without angina pectoris: Secondary | ICD-10-CM | POA: Diagnosis not present

## 2022-06-01 DIAGNOSIS — Z8572 Personal history of non-Hodgkin lymphomas: Secondary | ICD-10-CM | POA: Diagnosis not present

## 2022-06-01 DIAGNOSIS — F17200 Nicotine dependence, unspecified, uncomplicated: Secondary | ICD-10-CM

## 2022-06-01 MED ORDER — SPIRIVA RESPIMAT 2.5 MCG/ACT IN AERS: 2.0000 | INHALATION_SPRAY | Freq: Every day | RESPIRATORY_TRACT | 5 refills | Status: DC

## 2022-06-01 NOTE — Progress Notes (Signed)
PCP Blevins, Scott Lacks, MD   HPI  morbidly obese male with CAD and OSA (non compliant with CPAP). Left sided NECk  Node SUV 4 on PET Nov 2010 followed by Dr Wilburn Cornelia and gold stage 3 COPD (fev1 1.31/32% in nov 2010), and continued tobacco abuse  OV 12/02/2010: Last seen in Nov 2010. Not seen since then. Since then doing well overall. Smoking relapsed. Wants to quit but refuses chantix and zyban. STates he will try vapors of menthol. Dyspnea is class 2 and stable. Has not followed iwthDr. Wilburn Cornelia regarding neck node; states he sppoke to him and was told it is benign and he is self-monitoring. No problems so far. Mild cough +  IOV 02/08/2018  Chief Complaint  Patient presents with   pulmonary consult    referred by Dr. Johnsie Cancel for SOB   66 year old male that I essentially saw in 2012 so it is been 7 years. At that time he had Gold stage III COPD based on 2010 PFTs. Ongoing smoking. After that lost to follow-up but he says recently has had few admissions because of COPD exacerbation but he was considered was cardiac related and underwent coronary artery stent placement any somewhat better. At this point in time he tells me that he uses nighttime oxygen but daytime he does not needed.He has a lot of cough and shortness of breath and overall feels stable. He is on bronchodilator with long-acting anticholinergic agent aNORO for some years. He feels stable with this. He says it is expensive. He says any up titration of inhalers though it can be helpful he is very about cost. He does not have any further pulmonary function test because he already knows it is severe and given cardiac history he says he finds it difficult to do PFTs. Walking desaturation test today he did not desaturate. Last CT scan of the chest September 2080 that I personally visualized and shows bilateral pleural plaques and some congestion. He continues to smoke and says it is very difficult to quit smoking. He does not want  any help with quitting smoking. He is agreeable for some limited testing including alpha-1. Blood gas testing shows hypercapnia in April 2019 at the time of admission. He has a history of obstructive sleep apnea but will not wear CPAP due to intolerance and does not want to be questioned about this again      OV 05/13/2018  Subjective:  Patient ID: [name], male , DOB: [birth year] , age 18 y.o. , MRN: [id number] , ADDRESS: [place] [id number]   05/13/2018 -   Chief Complaint  Patient presents with   Follow-up    CT performed 9/20.  Pt states he has been doing well since last visit and denies any complaints.     HPI [name] 66 y.o. -routine follow-up for his severe COPD particularly after getting CT scan of the chest in September 2019.  I personally visualized the CT scan and to me the lung fields are clear he has asbestos pleural plaques.  Radiologist thinks that he might have early onset asbestosis but I do not think so.  He only has atelectasis.  He is feeling well.  He takes Anoro.  I gave him some Asmanex inhaled steroid to try but he did not like it.  I offered him flu shot but he says he will get it with his primary care physician will just works one floor below [place] but  for some reason he does not want to get it with [place].  His alpha-1 antitrypsin is normal at MM.  COPD CAT score is 14 and shows minimal level of symptoms.     IMPRESSION: Asbestos related pleural disease. 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   No suspicious lymphadenopathy in this patient with history of lymphoma. Spleen is normal in size.   Aortic Atherosclerosis (ICD10-I70.0) and Emphysema (ICD10-J43.9).     Electronically Signed   By: [name] M.D.   On: 05/03/2018 14:18   ROS - per HPI   OV 06/01/2022  Subjective:  Patient ID: [name], male , DOB: Dec 27, 1955 , age 27 y.o. , MRN: [id number] ,  ADDRESS: [address] [place] [address] PCP Blevins, Scott Lacks, MD Patient Care Team: [name], MD as PCP - General (Internal Medicine) [name], MD as PCP - Cardiology (Cardiology) [name], MD as Consulting Physician (Cardiology) Heath Lark, MD as Consulting Physician (Hematology and Oncology) Jerrell Belfast, MD as Consulting Physician (Otolaryngology) Daryll Brod, MD as Consulting Physician (Orthopedic Surgery) Brand Males, MD as Consulting Physician (Pulmonary Disease)  This Provider for this visit: Treatment Team:  Attending Provider: Brand Males, MD    06/01/2022 -new consult because it has been greater than 3 years since I saw him. Chief Complaint  Patient presents with   Consult    COPD.  Abn CT. Dyspnea and SOB.  Oxygen 2L qhs.  Seen in 2019.     HPI [name] 66 y.o. -Gold stage III COPD alpha-1 MM phenotype with hypercapnia based on prior ABG and PFT in 2010. He has a history of follicular lymphoma seen once in 2012 and then again in 2019 and then not followed up.  He is back here again at the request of her oncologist Dr. Alvy Bimler.  I reviewed her note.  He last saw heron 05/18/2022.  He had a CT scan 2 days prior that I personally visualized.  It shows that he has new onset mediastinal adenopathy particularly pretracheal node.  The oncologist does not feel this is a recurrence of his lymphoma this is a new finding.  Therefore he has been referred here for consideration of biopsy and reestablishment of COPD care.  He tells me that overall his shortness of breath is present but stable.  He is got dyspnea on exertion.  He says when he does to do anything quick it gets worse.  He does believe it somewhat progressive over the years.  Currently no flareup.  He ha [facility] with him.  He is not sure it is fully effective but he is more worried that is extremely expensive $150 per month.  He is looking for cheaper  alternatives.  Uses his wife's red inhaler [name not known] and this helps him.  He also has nebulizers which he is not sure is helping him.  There is associated cough and occasional wheezing.  He does have some occasional atypical chest pains.  He has known coronary artery disease and ejection systolic murmur.  He last saw cardiology on 04/12/2022 and he states he was cleared.  Their notes indicate he has coronary artery disease without angina status post CABG 2005.  They wanted him to continue with GDMT treatment  Simple office walk 185 feet x  3 laps goal with forehead probe 02/08/2018    O2 used Room air   Number laps completed 3   Comments about pace x  Resting Pulse Ox/HR 93% and 71/min   Final Pulse Ox/HR 90% and 87/min   Desaturated </= 88% no   Desaturated <= 3% points yes   Got Tachycardic >/= 90/min no   Symptoms at end of test x   Miscellaneous comments x     Latest Reference Range & Units 03/16/08 10:10 12/11/14 15:10 12/07/17 08:48  pH, Arterial 7.350 - 7.450  7.415 7.322 (L) 7.349 (L)  pCO2 arterial 32.0 - 48.0 mmHg 46.9 (H) 59.4 (HH) 54.4 (H)  pO2, Arterial 83.0 - 108.0 mmHg 76.2 (L) 50.8 (L) 88.0  (HH): Data is critically high (L): Data is abnormally low (H): Data is abnormally high   Latest Reference Range & Units 03/16/08 10:10 03/17/08 15:44 03/18/08 04:45 06/23/09 18:39 09/15/11 11:14 09/30/11 04:00 02/25/13 10:56 12/04/14 14:27 12/21/14 14:13 12/28/14 12:10 01/12/15 13:55 01/14/15 05:30 01/15/15 05:00 01/16/15 05:45 01/18/15 03:20 01/19/15 02:00 01/27/15 15:24 01/28/15 07:53 03/18/15 08:55 04/08/15 09:43 04/23/15 10:12 06/25/15 09:41 08/20/15 08:02 10/15/15 08:17 12/17/15 10:01 02/25/16 07:56 04/28/16 08:23 06/23/16 09:12 08/25/16 08:33 10/27/16 07:55 01/12/17 08:04 04/19/17 08:22 05/18/17 07:40 11/15/17 17:50 11/15/17 17:52 11/17/17 06:06 11/28/17 11:58 12/08/17 04:11 01/01/18 09:32 01/11/18 14:21 06/19/18 15:34 07/25/18 14:42 04/28/19 12:10 02/17/20 09:58 04/27/20  13:27 09/14/20 08:27 03/21/21 10:21 05/10/21 07:48 09/26/21 10:12 02/20/22 10:06 05/11/22 07:58  Hemoglobin 13.0 - 17.0 g/dL 15.6 14.3 12.1 (L) 15.4 16.2 15.9 17.0 16.4 15.2 15.7 14.7 13.5 13.3 12.9 (L) 14.2 14.0 14.1 13.8 13.5 13.3 14.8 16.0 15.8 15.0 14.5 15.4 15.1 14.8 15.1 15.3 14.7 15.5 16.1 18.4 (H) 17.4 (H) 16.2 15.1 13.9 14.2 14.0 16.2 14.3 16.3 15.6 14.9 13.4 14.0 12.9 (L) 13.2 12.2 (L) 12.6 (L)  (L): Data is abnormally low (H): Data is abnormally high CT Chest data 05/16/22  IMPRESSION: 1. Newly enlarged pretracheal lymph nodes measuring up to 1.8 x 1.3 cm , with additional newly prominent subcentimeter mediastinal and retroperitoneal lymph nodes in comparison to most recent examination is dated 2018. These are nonspecific and possibly reactive, however lymphomatous recurrence is not excluded and further surveillance or metabolic characterization by PET-CT may be considered. 2. Emphysema and diffuse bilateral bronchial wall thickening. 3. 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 4. Cardiomegaly and coronary artery disease. 5. Cirrhosis. 6. Cholelithiasis. 7. Bilateral nonobstructive nephrolithiasis.   Aortic Atherosclerosis (ICD10-I70.0) and Emphysema (ICD10-J43.9).     Electronically Signed   By: Delanna Ahmadi M.D.   On: 05/16/2022 15:30  No results found.    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   reports that he quit smoking about 4 years ago. His smoking use included cigarettes. He has a 82.00 pack-year smoking history. He has quit using smokeless tobacco.  His smokeless tobacco use included chew.  Past Surgical History:  Procedure Laterality Date   BASAL CELL CARCINOMA EXCISION  2000's   nose   CARDIAC CATHETERIZATION  12/07/2017   CAROTID ENDARTERECTOMY  03/2008   left  CORONARY ANGIOPLASTY WITH STENT PLACEMENT  09/29/11   "2"   CORONARY ARTERY BYPASS GRAFT  2005   CABG X3   CORONARY STENT INTERVENTION  12/07/2017   CORONARY STENT INTERVENTION N/A 12/07/2017   Procedure: CORONARY STENT INTERVENTION;  Surgeon: Burnell Blanks, MD;  Location: [facility];  Service: Cardiovascular;  Laterality: N/A;   evacuation of hematoma  03/2008   left neck; S/P endarterectomy; "cause heparin clotted it up"   I & D EXTREMITY Left 02/11/2015   Procedure: IRRIGATION AND DEBRIDEMENT LEFT LONG FINGER;  Surgeon: Leanora Cover, MD;  Location: [facility];  Service: Orthopedics;  Laterality: Left;  I and D Left Long Finger   IR REMOVAL TUN ACCESS W/ PORT W/O FL MOD SED  05/18/2017   MASS EXCISION Left 12/11/2014   Procedure: EXCISIONAL BIOPSY OF LEFT SUPRA CLAVICULAR NECK MASS;  Surgeon: Jerrell Belfast, MD;  Location: [facility];  Service: ENT;  Laterality: Left;   PERCUTANEOUS CORONARY STENT INTERVENTION (PCI-S) N/A 09/29/2011   Procedure: PERCUTANEOUS CORONARY STENT INTERVENTION (PCI-S);  Surgeon: Sherren Mocha, MD;  Location[facility];  Service: Cardiovascular;  Laterality: N/A;   PORTACATH PLACEMENT  2016   RIGHT/LEFT HEART CATH AND CORONARY/GRAFT ANGIOGRAPHY N/A 12/07/2017   Procedure: RIGHT/LEFT HEART CATH AND CORONARY/GRAFT ANGIOGRAPHY;  Surgeon: Burnell Blanks, MD;  Location: [facility];  Service: Cardiovascular;  Laterality: N/A;   SUPERFICIAL LYMPH NODE BIOPSY /  EXCISION Left     Allergies  Allergen Reactions   Bendamustine Hcl Rash    See hospital notes from 01/12/15   Heparin Other (See Comments)    Opposite reaction Heparin induced thrombocytopenia Other reaction(s): Heparin-Induced Thrombocytopenia   Tape Rash    Immunization History  Administered Date(s) Administered   Fluad Quad(high Dose 65+) 06/22/2020, 06/21/2021   Influenza Split 09/30/2011   Influenza,inj,Quad PF,6+ Mos 09/22/2013, 09/25/2014, 04/30/2015, 06/23/2016, 06/19/2018   PFIZER(Purple Top)SARS-COV-2 Vaccination 10/27/2019, 11/13/2019   Pneumococcal Conjugate-13 09/25/2014   Pneumococcal Polysaccharide-23 03/30/2015, 03/22/2020   Tdap 03/30/2015    Family History  Problem Relation Age of Onset   Heart disease Father    Bone cancer Mother      Current Outpatient Medications:    albuterol (PROVENTIL) (2.5 MG/3ML) 0.083% nebulizer solution, INHALE 1 VIAL VIA NEBULIZER EVERY DAY AS NEEDED FOR WHEEZING/SHORTNESS OF BREATH, Disp: 75 mL, Rfl: 5   amLODipine (NORVASC) 5 MG tablet, Take 1 tablet (5 mg total) by mouth daily., Disp: 30 tablet, Rfl: 11   ANORO ELLIPTA 62.5-25 MCG/ACT AEPB, USE 1 INHALATION ORALLY    DAILY AS NEEDED FOR        WHEEZING AND SHORTNESS OF  BREATH, Disp: 180 each, Rfl: 2   aspirin EC 81 MG tablet, Take 81 mg by mouth every evening. , Disp: , Rfl:    clonazePAM (KLONOPIN) 0.5 MG tablet, TAKE 1 TABLET BY MOUTH TWICE A DAY AS NEEDED, Disp: 60 tablet, Rfl: 3   clopidogrel (PLAVIX) 75 MG tablet, TAKE 1 TABLET BY MOUTH EVERY DAY WITH BREAKFAST, Disp: 90 tablet, Rfl: 3   KLOR-CON M10 10 MEQ tablet, Take 10 mEq by mouth daily. Take 1 by mouth daily, Disp: , Rfl:    metolazone (ZAROXOLYN) 5 MG tablet, TAKE 1 TABLET BY MOUTH EVERY DAY, Disp: 30 tablet, Rfl: 11   metoprolol succinate (TOPROL-XL) 50 MG 24 hr tablet, Take 1 tablet (50 mg total) by mouth daily. Take with or immediately following a meal., Disp: 90 tablet, Rfl: 3   omeprazole (  PRILOSEC) 40 MG  capsule, Take 1 capsule (40 mg total) by mouth daily., Disp: 30 capsule, Rfl: 3   oxyCODONE-acetaminophen (PERCOCET) 7.5-325 MG tablet, Take 1 tablet by mouth every 6 (six) hours as needed for severe pain., Disp: 120 tablet, Rfl: 0   oxyCODONE-acetaminophen (PERCOCET) 7.5-325 MG tablet, Take 1 tablet by mouth every 6 (six) hours as needed for severe pain., Disp: 120 tablet, Rfl: 0   oxymetazoline (AFRIN) 0.05 % nasal spray, Place 1 spray into both nostrils daily as needed for congestion., Disp: , Rfl:    simvastatin (ZOCOR) 20 MG tablet, Take 1 tablet (20 mg total) by mouth at bedtime., Disp: 90 tablet, Rfl: 3   torsemide (DEMADEX) 100 MG tablet, Take 1 tablet (100 mg total) by mouth daily., Disp: 90 tablet, Rfl: 1   nitroGLYCERIN (NITROSTAT) 0.4 MG SL tablet, Place 1 tablet (0.4 mg total) under the tongue every 5 (five) minutes as needed for chest pain (Up to 3 doses)., Disp: 25 tablet, Rfl: 1      Objective:   Vitals:   06/01/22 0830  BP: 134/72  Pulse: 78  Temp: 98.3 F (36.8 C)  TempSrc: Oral  SpO2: 93%  Weight: 231 lb (104.8 kg)  Height: 6' (1.829 m)    Estimated body mass index is 31.33 kg/m as calculated from the following:   Height as of this encounter: 6' (1.829 m).   Weight as of this encounter: 231 lb (104.8 kg).  '@WEIGHTCHANGE'$ @  [employer]   06/01/22 0830  Weight: 231 lb (104.8 kg)     Physical Exam    General: No distress. Looks well but somewhat deconditioned Neuro: Alert and Oriented x 3. GCS 15. Speech normal Psych: Pleasant Resp:  Barrel Chest - yes.  Wheeze - no, Crackles - no, No overt respiratory distress CVS: Normal heart sounds. Murmurs - YES Ext: Stigmata of Connective Tissue Disease - no HEENT: Normal upper airway. PEERL +. No post nasal drip        Assessment:       ICD-10-CM   1. COPD, severe ([place])  J44.9 Pulmonary function test    Pulmonary function test    2. Mediastinal adenopathy  R59.0 NM PET Image Initial (PI) Skull Base To  Thigh    3. History of follicular lymphoma  G81.85 NM PET Image Initial (PI) Skull Base To Thigh    4. Smoker  F17.200     5. 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 She is not sure if this is lymphoma. She thinks is something else. She wants [place] to biopsy it  Plan  - get PET scan  - refer Icard/[facility] after PET scan  Smoker  Plan  - please quit smoking  FOllowup - Icard/[facility] after PET scan - Dr [name] Caller in 3 months after PFT and ABG    SIGNATURE    Dr. Brand Males, M.D., F.C.C.P,  Pulmonary and Critical Care Medicine Staff Physician, [facility] Director - Interstitial Lung Disease  Program  Pulmonary [place] at [place], [place], [address]  Pager: [phone], If no answer or between  15:00h - 7:00h: call [phone] Telephone: [phone]  8:58 AM 06/01/2022

## 2022-06-01 NOTE — Patient Instructions (Addendum)
COPD, severe ([place])- alpha 1 MM  - stable without flare up but last staged in 2010 - as gold stage 3/severe - anoro too expensive  Plan  - change anoro to spiriva respimat schedule - if expensive let us know  - cotninue albuterol as needed - do ABG - do PFT  Mediastinal adenopathy History of follicular lymphoma Smoker Hx of asbestos exposure   - Dr Alvy Bimler is concerned about this new gland enlargement in your chest. She is not sure if this is lymphoma. She thinks is something else. She wants [place] to biopsy it  Plan  - get PET scan  - refer Icard/[facility] after PET scan  Smoker  Plan  - please quit smoking  FOllowup - Icard/[facility] after PET scan - Dr [name] Caller in 3 months after PFT and ABG

## 2022-06-12 ENCOUNTER — Other Ambulatory Visit (HOSPITAL_COMMUNITY): Payer: Medicare Other

## 2022-06-12 ENCOUNTER — Telehealth: Payer: Self-pay

## 2022-06-12 NOTE — Telephone Encounter (Signed)
Returned his call. He has PET ordered for 11/6 and then he with see Dr. [name] Caller. He had to move appt for Dr. Benson Norway to 11/13 due to appts on the same day. He just wanted to make Dr. Jacklynn Lewis aware.

## 2022-06-12 NOTE — Telephone Encounter (Signed)
Got it, thanks!

## 2022-06-19 ENCOUNTER — Encounter (HOSPITAL_COMMUNITY)
Admission: RE | Admit: 2022-06-19 | Discharge: 2022-06-19 | Disposition: A | Discharge status: Home / Self Care | Payer: Medicare Other | Source: Ambulatory Visit | Attending: Internal Medicine | Admitting: Internal Medicine

## 2022-06-19 DIAGNOSIS — R590 Localized enlarged lymph nodes: Secondary | ICD-10-CM | POA: Diagnosis present

## 2022-06-19 DIAGNOSIS — Z8572 Personal history of non-Hodgkin lymphomas: Secondary | ICD-10-CM | POA: Diagnosis present

## 2022-06-19 LAB — GLUCOSE, CAPILLARY: Glucose-Capillary: 99 mg/dL (ref 70–99)

## 2022-06-19 MED ORDER — FLUDEOXYGLUCOSE F - 18 (FDG) INJECTION
11.5000 | Freq: Once | INTRAVENOUS | Status: AC | PRN
  Administered 2022-06-19: 11.56 via INTRAVENOUS

## 2022-06-21 ENCOUNTER — Other Ambulatory Visit: Payer: Self-pay | Admitting: Hematology and Oncology

## 2022-06-22 ENCOUNTER — Ambulatory Visit: Payer: Medicare Other | Admitting: Pulmonary Disease

## 2022-06-22 ENCOUNTER — Telehealth: Payer: Self-pay

## 2022-06-22 NOTE — Telephone Encounter (Signed)
-----   Message from Heath Lark, MD sent at 06/22/2022  8:39 AM EST ----- I receive request for 90 pills of protonix I do not think he should get this refill as it might interact with plavix long term I thought I sent him to see GI I recommend GI evaluation so I am going to decline this, please let him know

## 2022-06-22 NOTE — Telephone Encounter (Signed)
Called regarding request from pharmacy for Protonix. Dr. Alvy Bimler declined protonix request. Told him it may interfere with Plavix Rx long term. He verbalized understanding. He will see GI on Monday.

## 2022-06-26 ENCOUNTER — Telehealth: Payer: Self-pay | Admitting: Internal Medicine

## 2022-06-26 NOTE — Telephone Encounter (Signed)
Patient is returning phone call. Patient phone number is [phone].

## 2022-06-26 NOTE — Telephone Encounter (Signed)
Attempted to call pt but unable to reach. Left message for him to return call. °

## 2022-06-26 NOTE — Telephone Encounter (Signed)
PET scan without uptake on the nodes . He needs to keep appt with [facility]   xxxxxxxxxxxxxxxxxxxxxxxxxxxxx  IMPRESSION: 1. Precarinal and retroperitoneal lymph nodes identified as new on CT scan 05/15/2022 are stable in size in the interval. No substantial hypermetabolism in these lymph nodes on today's study ([place] 2). 2. Cholelithiasis. 3. Bilateral nonobstructing renal calculi. 4. Bilateral calcified pleural plaques compatible with previous asbestos exposure. 5. Trace free fluid in the central pelvis and along the inferior tip of the liver. 6.  Aortic Atherosclerosis (ICD10-I70.0).     Electronically Signed   By: Misty Stanley M.D.   On: 06/21/2022 10:04

## 2022-06-27 ENCOUNTER — Telehealth: Payer: Self-pay | Admitting: *Deleted

## 2022-06-27 NOTE — Telephone Encounter (Signed)
   Pre-operative Risk Assessment    Patient Name: [name]  DOB: July 29, 1956 MRN: [id number]      Request for Surgical Clearance    Procedure:   COLONOSCOPY  Date of Surgery:  Clearance 08/22/22                                 Surgeon:  DR. HUNG Surgeon's Group or Practice Name:  [facility] Phone number:  [phone] Fax number:  [phone]   Type of Clearance Requested:   - Medical  - Pharmacy:  Hold Aspirin and Clopidogrel (Plavix)     Type of Anesthesia:   PROPOFOL   Additional requests/questions:    Jiles Prows   06/27/2022, 4:19 PM

## 2022-06-27 NOTE — Telephone Encounter (Signed)
Spoke with patient and relayed message from Dr [name] Caller about PET scan. Pt informed me he saw results on MyChart and will keep appt with Dr Lamonte Sakai. Nothing further needed.

## 2022-06-28 NOTE — Telephone Encounter (Signed)
   Name: [name]  DOB: [birth year]  MRN: [id number]  Primary Cardiologist: Jenkins Rouge, MD  Chart reviewed as part of pre-operative protocol coverage. Because of Nekoda Chock Rickert's past medical history and time since last visit, he will require a follow-up telephone visit in order to better assess preoperative cardiovascular risk.  Pre-op covering staff: - Please schedule appointment and call patient to inform them. If patient already had an upcoming appointment within acceptable timeframe, please add "pre-op clearance" to the appointment notes so provider is aware. - Please contact requesting surgeon's office via preferred method (i.e, phone, fax) to inform them of need for appointment prior to surgery.  Most recent PCI to the SVG-RCA graft in 2019. Okay to hold Plavix x 5 days prior to procedure. Would prefer to continue ASA during this time, however if biopsies are planned or bleeding risk is too high, can hold ASA x 7 days prior to procedure. Please restart antiplatelet agents when medically safe to do so.  Elgie Collard, PA-C  06/28/2022, 4:37 PM

## 2022-06-29 ENCOUNTER — Telehealth: Payer: Self-pay | Admitting: *Deleted

## 2022-06-29 NOTE — Telephone Encounter (Signed)
Pt has been scheduled for a tele-visit, 06/30/22 2:00.  Consent on file / medications reconciled.       Patient Consent for Virtual Visit        [name] has provided verbal consent on 06/29/2022 for a virtual visit (video or telephone).   CONSENT FOR VIRTUAL VISIT FOR:  [name]  By participating in this virtual visit I agree to the following:  I hereby voluntarily request, consent and authorize [facility] and its employed or contracted physicians, physician assistants, nurse practitioners or other licensed health care professionals (the Practitioner), to provide me with telemedicine health care services (the "Services") as deemed necessary by the treating Practitioner. I acknowledge and consent to receive the Services by the Practitioner via telemedicine. I understand that the telemedicine visit will involve communicating with the Practitioner through live audiovisual communication technology and the disclosure of certain medical information by electronic transmission. I acknowledge that I have been given the opportunity to request an in-person assessment or other available alternative prior to the telemedicine visit and am voluntarily participating in the telemedicine visit.  I understand that I have the right to withhold or withdraw my consent to the use of telemedicine in the course of my care at any time, without affecting my right to future care or treatment, and that the Practitioner or I may terminate the telemedicine visit at any time. I understand that I have the right to inspect all information obtained and/or recorded in the course of the telemedicine visit and may receive copies of available information for a reasonable fee.  I understand that some of the potential risks of receiving the Services via telemedicine include:  Delay or interruption in medical evaluation due to technological equipment failure or disruption; Information transmitted may not  be sufficient (e.g. poor resolution of images) to allow for appropriate medical decision making by the Practitioner; and/or  In rare instances, security protocols could fail, causing a breach of personal health information.  Furthermore, I acknowledge that it is my responsibility to provide information about my medical history, conditions and care that is complete and accurate to the best of my ability. I acknowledge that Practitioner's advice, recommendations, and/or decision may be based on factors not within their control, such as incomplete or inaccurate data provided by me or distortions of diagnostic images or specimens that may result from electronic transmissions. I understand that the practice of medicine is not an exact science and that Practitioner makes no warranties or guarantees regarding treatment outcomes. I acknowledge that a copy of this consent can be made available to me via my patient portal ([facility]), or I can request a printed copy by calling the office of [facility].    I understand that my insurance will be billed for this visit.   I have read or had this consent read to me. I understand the contents of this consent, which adequately explains the benefits and risks of the Services being provided via telemedicine.  I have been provided ample opportunity to ask questions regarding this consent and the Services and have had my questions answered to my satisfaction. I give my informed consent for the services to be provided through the use of telemedicine in my medical care

## 2022-06-29 NOTE — Telephone Encounter (Signed)
Pt has been scheduled for a tele-visit, 06/30/22 2:00.  Consent on file / medications reconciled.

## 2022-06-30 ENCOUNTER — Ambulatory Visit: Payer: Medicare Other | Attending: Cardiology | Admitting: Nurse Practitioner

## 2022-06-30 DIAGNOSIS — Z01810 Encounter for preprocedural cardiovascular examination: Secondary | ICD-10-CM | POA: Diagnosis not present

## 2022-06-30 NOTE — Progress Notes (Signed)
Virtual Visit via Telephone Note   Because of [name]'s co-morbid illnesses, he is at least at moderate risk for complications without adequate follow up.  This format is felt to be most appropriate for this patient at this time.  The patient did not have access to video technology/had technical difficulties with video requiring transitioning to audio format only (telephone).  All issues noted in this document were discussed and addressed.  No physical exam could be performed with this format.  Please refer to the patient's chart for his consent to telehealth for [facility].  Evaluation Performed:  Preoperative cardiovascular risk assessment _____________   Date:  06/30/2022   Patient ID:  Scott, Blevins 08-22-1955, MRN [id number] Patient Location:  Home Provider location:   Office  Primary Care Provider:  [name], MD Primary Cardiologist:  Jenkins Rouge, MD  Chief Complaint / Patient Profile   66 y.o. y/o male with a h/o CAD s/p CABG x3 in 2005, DES-SVG-RCA in 2019, chronic dyspnea on exertion, carotid artery disease, hypertension, hyperlipidemia, aortic root dilation, and cardiac murmur who is pending colonoscopy on 08/22/2021 with Dr. Benson Norway of [facility] and presents today for telephonic preoperative cardiovascular risk assessment.  Past Medical History    Past Medical History:  Diagnosis Date   Anginal pain ([place])    not had to use in awhile  none in 10 years   Anxiety    Basal cell carcinoma of nose    Blood dyscrasia    trouble clotting    Carotid artery disease ([place])    s/p L CEA 2009 (hx of evacuation of hematoma due to heparin)   CHF (congestive heart failure) (HCC)    Chronic lower back pain    COPD (chronic obstructive pulmonary disease) ([place])    Coronary artery disease    CABG 2005. s/p PTCA and stenting of the saphenous vein graft to PDA and saphenous vein graft to obtuse marginal by Dr. Burt Knack 09/29/11. Normal  EF at cath 09/2011   Depression    Diabetes mellitus without complication (HCC)    borderline    Dysrhythmia    fluttering   Edema    GERD (gastroesophageal reflux disease)    Heart murmur    Heparin induced thrombocytopenia (HCC)    History of home oxygen therapy    2 liters at night prn   Hyperlipidemia    Hypertension    Lymphoma ([place]) 12/21/2014   Myocardial infarction ([place])    Obesity    Shortness of breath dyspnea    with exertion    Sleep apnea    "used to"      could not use 2006   Past Surgical History:  Procedure Laterality Date   BASAL CELL CARCINOMA EXCISION  2000's   nose   CARDIAC CATHETERIZATION  12/07/2017   CAROTID ENDARTERECTOMY  03/2008   left   CORONARY ANGIOPLASTY WITH STENT PLACEMENT  09/29/11   "2"   CORONARY ARTERY BYPASS GRAFT  2005   CABG X3   CORONARY STENT INTERVENTION  12/07/2017   CORONARY STENT INTERVENTION N/A 12/07/2017   Procedure: CORONARY STENT INTERVENTION;  Surgeon: Burnell Blanks, MD;  Location: [facility];  Service: Cardiovascular;  Laterality: N/A;   evacuation of hematoma  03/2008   left neck; S/P endarterectomy; "cause heparin clotted it up"   I & D EXTREMITY Left 02/11/2015   Procedure: IRRIGATION AND DEBRIDEMENT LEFT LONG FINGER;  Surgeon: Leanora Cover, MD;  Location: [facility];  Service: Orthopedics;  Laterality: Left;  I and D Left Long Finger   IR REMOVAL TUN ACCESS W/ PORT W/O FL MOD SED  05/18/2017   MASS EXCISION Left 12/11/2014   Procedure: EXCISIONAL BIOPSY OF LEFT SUPRA CLAVICULAR NECK MASS;  Surgeon: Jerrell Belfast, MD;  Location: [facility];  Service: ENT;  Laterality: Left;   PERCUTANEOUS CORONARY STENT INTERVENTION (PCI-S) N/A 09/29/2011   Procedure: PERCUTANEOUS CORONARY STENT INTERVENTION (PCI-S);  Surgeon: Sherren Mocha, MD;  Location: [facility];  Service: Cardiovascular;  Laterality: N/A;   PORTACATH PLACEMENT  2016   RIGHT/LEFT HEART CATH AND CORONARY/GRAFT ANGIOGRAPHY N/A 12/07/2017    Procedure: RIGHT/LEFT HEART CATH AND CORONARY/GRAFT ANGIOGRAPHY;  Surgeon: Burnell Blanks, MD;  Location: [facility];  Service: Cardiovascular;  Laterality: N/A;   SUPERFICIAL LYMPH NODE BIOPSY / EXCISION Left     Allergies  Allergies  Allergen Reactions   Bendamustine Hcl Rash    See hospital notes from 01/12/15   Heparin Other (See Comments)    Opposite reaction Heparin induced thrombocytopenia Other reaction(s): Heparin-Induced Thrombocytopenia   Tape Rash    History of Present Illness    [name] is a 66 y.o. male who presents via audio/video conferencing for a telehealth visit today.  Pt was last seen in cardiology clinic on 04/12/2022 by Christen Bame, NP.  At that time Aloysuis Ribaudo was doing well.  The patient is now pending procedure as outlined above. Since his last visit, he has been stable from a cardiac standpoint.  He has stable chronic dyspnea in the setting of COPD.  Echocardiogram in 04/2022 was unremarkable.  He denies chest pain, palpitations, pnd, orthopnea, n, v, dizziness, syncope, edema, weight gain, or early satiety. All other systems reviewed and are otherwise negative except as noted above.   Home Medications    Prior to Admission medications   Medication Sig Start Date End Date Taking? Authorizing Provider  albuterol (PROVENTIL) (2.5 MG/3ML) 0.083% nebulizer solution INHALE 1 VIAL VIA NEBULIZER EVERY DAY AS NEEDED FOR WHEEZING/SHORTNESS OF BREATH 10/24/21   Plotnikov, [name], MD  amLODipine (NORVASC) 5 MG tablet Take 1 tablet (5 mg total) by mouth daily. 04/12/22 04/13/23  Swinyer, Lanice Schwab, NP  aspirin EC 81 MG tablet Take 81 mg by mouth every evening.     [provider]  clonazePAM (KLONOPIN) 0.5 MG tablet TAKE 1 TABLET BY MOUTH TWICE A DAY AS NEEDED 05/23/22   Plotnikov, [name], MD  clopidogrel (PLAVIX) 75 MG tablet TAKE 1 TABLET BY MOUTH EVERY DAY WITH BREAKFAST 07/18/21   Plotnikov, [name], MD   metolazone (ZAROXOLYN) 5 MG tablet TAKE 1 TABLET BY MOUTH EVERY DAY 07/12/20   Plotnikov, [name], MD  metoprolol succinate (TOPROL-XL) 50 MG 24 hr tablet Take 1 tablet (50 mg total) by mouth daily. Take with or immediately following a meal. 11/24/21   Plotnikov, [name], MD  nitroGLYCERIN (NITROSTAT) 0.4 MG SL tablet Place 1 tablet (0.4 mg total) under the tongue every 5 (five) minutes as needed for chest pain (Up to 3 doses). 01/21/16 06/29/22  [name], MD  oxyCODONE-acetaminophen (PERCOCET) 7.5-325 MG tablet Take 1 tablet by mouth every 6 (six) hours as needed for severe pain. 05/23/22   Plotnikov, [name], MD  oxymetazoline (AFRIN) 0.05 % nasal spray Place 1 spray into both nostrils daily as needed for congestion.    [provider]  simvastatin (ZOCOR) 20 MG tablet Take  1 tablet (20 mg total) by mouth at bedtime. 11/24/21   Plotnikov, [name], MD  Tiotropium Bromide Monohydrate (SPIRIVA RESPIMAT) 2.5 MCG/ACT AERS Inhale 2 puffs into the lungs daily. 06/01/22   Brand Males, MD  torsemide (DEMADEX) 100 MG tablet Take 1 tablet (100 mg total) by mouth daily. 05/25/22   Plotnikov, [name], MD    Physical Exam    Vital Signs:  Gibran Veselka does not have vital signs available for review today.  Given telephonic nature of communication, physical exam is limited. AAOx3. NAD. Normal affect.  Speech and respirations are unlabored.  Accessory Clinical Findings    None  Assessment & Plan    1.  Preoperative Cardiovascular Risk Assessment:  According to the Revised Cardiac Risk Index (RCRI), his Perioperative Risk of Major Cardiac Event is (%): 0.9. His Functional Capacity in METs is: 4.95 according to the [facility] Activity Status Index (DASI).The patient was advised that if he develops new symptoms prior to surgery to contact our office to arrange for a follow-up visit, and he verbalized understanding.  Most recent PCI to the SVG-RCA graft in 2019. Okay to hold  Plavix x 5 days prior to procedure. Would prefer to continue ASA during this time, however if biopsies are planned or bleeding risk is too high, can hold ASA x 7 days prior to procedure. Please restart antiplatelet agents when medically safe to do so.    A copy of this note will be routed to requesting surgeon.  Time:   Today, I have spent 6 minutes with the patient with telehealth technology discussing medical history, symptoms, and management plan.     Lenna Sciara, NP  06/30/2022, 2:10 PM

## 2022-07-03 ENCOUNTER — Other Ambulatory Visit: Payer: Self-pay | Admitting: Gastroenterology

## 2022-07-13 ENCOUNTER — Encounter: Payer: Self-pay | Admitting: Emergency Medicine

## 2022-07-13 ENCOUNTER — Ambulatory Visit (INDEPENDENT_AMBULATORY_CARE_PROVIDER_SITE_OTHER): Payer: Medicare Other | Admitting: Emergency Medicine

## 2022-07-13 VITALS — BP 130/72 | HR 63 | Temp 98.4°F | Ht 72.0 in | Wt 242.0 lb

## 2022-07-13 DIAGNOSIS — I2510 Atherosclerotic heart disease of native coronary artery without angina pectoris: Secondary | ICD-10-CM

## 2022-07-13 DIAGNOSIS — I2583 Coronary atherosclerosis due to lipid rich plaque: Secondary | ICD-10-CM

## 2022-07-13 DIAGNOSIS — R590 Localized enlarged lymph nodes: Secondary | ICD-10-CM | POA: Diagnosis not present

## 2022-07-13 NOTE — Progress Notes (Signed)
Subjective:    Patient ID: [name], male    DOB: 26-May-1956, 66 y.o.   MRN: [id number]  HPI 66 year old gentleman with a history of tobacco use (82 pack years), CAD/CABG, diabetes, hypertension, COPD, OSA not on CPAP.  He also has history of follicular lymphoma by Dr. Alvy Bimler.  He had new lymphadenopathy noted on recent chest imaging 05/15/2022, prompted PET scan and then referral for further evaluation of the lymphadenopathy. He has exertional SOB with walking, stairs. No cough. Occasional wheeze.  He just underwent cardiac clearance in prep for CSY w Dr Benson Norway.   CT scan chest, abdomen, pelvis 05/15/2022 reviewed by me shows newly enlarged pretracheal lymph nodes up to 1.8 cm, prevascular and AP window nodes.  There is mild centrilobular emphysema with diffuse bronchial wall thickening and calcified bilateral pleural plaques, some bandlike atelectasis.  No lymphadenopathy noted in the abdomen.  PET scan 06/19/2022 reviewed by me shows persistent enlargement of his precarinal node with only low-level hypermetabolic activity.  There is no other hypermetabolic mediastinal or hilar adenopathy seen.   Review of Systems As per HPI  Past Medical History:  Diagnosis Date   Anginal pain ([place])    not had to use in awhile  none in 10 years   Anxiety    Basal cell carcinoma of nose    Blood dyscrasia    trouble clotting    Carotid artery disease ([place])    s/p L CEA 2009 (hx of evacuation of hematoma due to heparin)   CHF (congestive heart failure) (HCC)    Chronic lower back pain    COPD (chronic obstructive pulmonary disease) ([place])    Coronary artery disease    CABG 2005. s/p PTCA and stenting of the saphenous vein graft to PDA and saphenous vein graft to obtuse marginal by Dr. Burt Knack 09/29/11. Normal EF at cath 09/2011   Depression    Diabetes mellitus without complication (HCC)    borderline    Dysrhythmia    fluttering   Edema    GERD (gastroesophageal reflux disease)    Heart  murmur    Heparin induced thrombocytopenia (HCC)    History of home oxygen therapy    2 liters at night prn   Hyperlipidemia    Hypertension    Lymphoma ([place]) 12/21/2014   Myocardial infarction ([place])    Obesity    Shortness of breath dyspnea    with exertion    Sleep apnea    "used to"      could not use 2006     Family History  Problem Relation Age of Onset   Heart disease Father    Bone cancer Mother      Social History   Socioeconomic History   Marital status: Married    Spouse name: Not on file   Number of children: 1   Years of education: Not on file   Highest education level: Not on file  Occupational History   Occupation: Museum/gallery curator  Tobacco Use   Smoking status: Former    Packs/day: 2.00    Years: 41.00    Total pack years: 82.00    Types: Cigarettes    Quit date: 11/23/2017    Years since quitting: 4.6   Smokeless tobacco: Former    Types: Chew   Tobacco comments:    consult entered  Vaping Use   Vaping Use: Never used  Substance and Sexual Activity   Alcohol use: No    Alcohol/week: 0.0 standard  drinks of alcohol    Comment: 09/29/11 "quit years ago"   Drug use: Yes    Types: Marijuana    Comment: "last time was in the 1990's"   Sexual activity: Not Currently  Other Topics Concern   Not on file  Social History Narrative   Not on file   Social Determinants of Health   Financial Resource Strain: Low Risk  (03/03/2022)   Overall Financial Resource Strain (CARDIA)    Difficulty of Paying Living Expenses: Not hard at all  Food Insecurity: No Food Insecurity (03/20/2022)   Hunger Vital Sign    Worried About Running Out of Food in the Last Year: Never true    Ran Out of Food in the Last Year: Never true  Transportation Needs: No Transportation Needs (03/20/2022)   PRAPARE - Hydrologist (Medical): No    Lack of Transportation (Non-Medical): No  Physical Activity: Sufficiently Active (03/03/2022)   Exercise Vital Sign     Days of Exercise per Week: 5 days    Minutes of Exercise per Session: 30 min  Stress: No Stress Concern Present (03/03/2022)   [place]    Feeling of Stress : Not at all  Social Connections: [place] (03/03/2022)   Social Connection and Isolation Panel [NHANES]    Frequency of Communication with Friends and Family: More than three times a week    Frequency of Social Gatherings with Friends and Family: More than three times a week    Attends Religious Services: More than 4 times per year    Active Member of [employer] or Organizations: Yes    Attends Music therapist: More than 4 times per year    Marital Status: Married  Human resources officer Violence: Not At Risk (03/03/2022)   Humiliation, Afraid, Rape, and Kick questionnaire    Fear of Current or Ex-Partner: No    Emotionally Abused: No    Physically Abused: No    Sexually Abused: No      Allergies  Allergen Reactions   Bendamustine Hcl Rash    See hospital notes from 01/12/15   Heparin Other (See Comments)    Opposite reaction Heparin induced thrombocytopenia Other reaction(s): Heparin-Induced Thrombocytopenia   Tape Rash     Outpatient Medications Prior to Visit  Medication Sig Dispense Refill   albuterol (PROVENTIL) (2.5 MG/3ML) 0.083% nebulizer solution INHALE 1 VIAL VIA NEBULIZER EVERY DAY AS NEEDED FOR WHEEZING/SHORTNESS OF BREATH 75 mL 5   amLODipine (NORVASC) 5 MG tablet Take 1 tablet (5 mg total) by mouth daily. 30 tablet 11   aspirin EC 81 MG tablet Take 81 mg by mouth every evening.      clonazePAM (KLONOPIN) 0.5 MG tablet TAKE 1 TABLET BY MOUTH TWICE A DAY AS NEEDED 60 tablet 3   clopidogrel (PLAVIX) 75 MG tablet TAKE 1 TABLET BY MOUTH EVERY DAY WITH BREAKFAST 90 tablet 3   metolazone (ZAROXOLYN) 5 MG tablet TAKE 1 TABLET BY MOUTH EVERY DAY 30 tablet 11   metoprolol succinate (TOPROL-XL) 50 MG 24 hr tablet Take 1 tablet (50 mg total)  by mouth daily. Take with or immediately following a meal. 90 tablet 3   oxyCODONE-acetaminophen (PERCOCET) 7.5-325 MG tablet Take 1 tablet by mouth every 6 (six) hours as needed for severe pain. 120 tablet 0   oxymetazoline (AFRIN) 0.05 % nasal spray Place 1 spray into both nostrils daily as needed for congestion.  simvastatin (ZOCOR) 20 MG tablet Take 1 tablet (20 mg total) by mouth at bedtime. 90 tablet 3   torsemide (DEMADEX) 100 MG tablet Take 1 tablet (100 mg total) by mouth daily. 90 tablet 1   nitroGLYCERIN (NITROSTAT) 0.4 MG SL tablet Place 1 tablet (0.4 mg total) under the tongue every 5 (five) minutes as needed for chest pain (Up to 3 doses). 25 tablet 1   Tiotropium Bromide Monohydrate (SPIRIVA RESPIMAT) 2.5 MCG/ACT AERS Inhale 2 puffs into the lungs daily. 4 g 5   No facility-administered medications prior to visit.         Objective:   Physical Exam  Vitals:   07/13/22 0930  BP: 130/72  Pulse: 63  Temp: 98.4 F (36.9 C)  TempSrc: Oral  SpO2: 96%  Weight: 242 lb (109.8 kg)  Height: 6' (1.829 m)   Gen: Pleasant, obese man, in no distress,  normal affect  ENT: No lesions,  mouth clear,  oropharynx clear, no postnasal drip  Neck: No JVD, no stridor  Lungs: No use of accessory muscles, distant, no crackles or wheezing on normal respiration, no wheeze on forced expiration  Cardiovascular: RRR, crescendo sys M with intact S2   Musculoskeletal: No deformities, no cyanosis or clubbing  Neuro: alert, awake, non focal  Skin: Warm, no lesions or rash     Assessment & Plan:   Mediastinal adenopathy Noted on CT scan of the chest, abdomen, pelvis when he was being evaluated for weight loss in the setting of his known follicular lymphoma.  He has a 1.8 cm node at station 4R, not particularly hypermetabolic on his PET scan.  Recommendation made to obtain tissue diagnosis via endobronchial ultrasound.  He understands and agrees.  We will try to get this set up for  07/24/2022.  He will need to be off Plavix for 5 days.  He has already undergone cardiac clearance and clearance to stop the Plavix temporarily.   We reviewed your CT scan and your PET scan today. We will arrange for bronchoscopy with endobronchial ultrasound to evaluate enlarged lymph node in the chest.  This will be done under general anesthesia as an outpatient at [facility] endoscopy.  We will try to get this set up for 07/24/2022.  You will need a designated driver.  You will need to stop your Plavix 5 days before the procedure.  You will need to stop your aspirin 2 days before the procedure. Depending on results we will plan follow-up with Dr. Gillis Ends, MD, PhD 07/13/2022, 10:23 AM [facility] [phone] or if no answer before 7:00PM call [phone] For any issues after 7:00PM please call eLink [phone]

## 2022-07-13 NOTE — H&P (View-Only) (Signed)
Subjective:    Patient ID: [name], male    DOB: 1956-04-28, 66 y.o.   MRN: [id number]  HPI 66 year old gentleman with a history of tobacco use (82 pack years), CAD/CABG, diabetes, hypertension, COPD, OSA not on CPAP.  He also has history of follicular lymphoma by Dr. Alvy Bimler.  He had new lymphadenopathy noted on recent chest imaging 05/15/2022, prompted PET scan and then referral for further evaluation of the lymphadenopathy. He has exertional SOB with walking, stairs. No cough. Occasional wheeze.  He just underwent cardiac clearance in prep for CSY w Dr Benson Norway.   CT scan chest, abdomen, pelvis 05/15/2022 reviewed by me shows newly enlarged pretracheal lymph nodes up to 1.8 cm, prevascular and AP window nodes.  There is mild centrilobular emphysema with diffuse bronchial wall thickening and calcified bilateral pleural plaques, some bandlike atelectasis.  No lymphadenopathy noted in the abdomen.  PET scan 06/19/2022 reviewed by me shows persistent enlargement of his precarinal node with only low-level hypermetabolic activity.  There is no other hypermetabolic mediastinal or hilar adenopathy seen.   Review of Systems As per HPI  Past Medical History:  Diagnosis Date   Anginal pain ([place])    not had to use in awhile  none in 10 years   Anxiety    Basal cell carcinoma of nose    Blood dyscrasia    trouble clotting    Carotid artery disease ([place])    s/p L CEA 2009 (hx of evacuation of hematoma due to heparin)   CHF (congestive heart failure) (HCC)    Chronic lower back pain    COPD (chronic obstructive pulmonary disease) ([place])    Coronary artery disease    CABG 2005. s/p PTCA and stenting of the saphenous vein graft to PDA and saphenous vein graft to obtuse marginal by Dr. Burt Knack 09/29/11. Normal EF at cath 09/2011   Depression    Diabetes mellitus without complication (HCC)    borderline    Dysrhythmia    fluttering   Edema    GERD (gastroesophageal reflux disease)    Heart  murmur    Heparin induced thrombocytopenia (HCC)    History of home oxygen therapy    2 liters at night prn   Hyperlipidemia    Hypertension    Lymphoma ([place]) 12/21/2014   Myocardial infarction ([place])    Obesity    Shortness of breath dyspnea    with exertion    Sleep apnea    "used to"      could not use 2006     Family History  Problem Relation Age of Onset   Heart disease Father    Bone cancer Mother      Social History   Socioeconomic History   Marital status: Married    Spouse name: Not on file   Number of children: 1   Years of education: Not on file   Highest education level: Not on file  Occupational History   Occupation: Museum/gallery curator  Tobacco Use   Smoking status: Former    Packs/day: 2.00    Years: 41.00    Total pack years: 82.00    Types: Cigarettes    Quit date: 11/23/2017    Years since quitting: 4.6   Smokeless tobacco: Former    Types: Chew   Tobacco comments:    consult entered  Vaping Use   Vaping Use: Never used  Substance and Sexual Activity   Alcohol use: No    Alcohol/week: 0.0 standard  drinks of alcohol    Comment: 09/29/11 "quit years ago"   Drug use: Yes    Types: Marijuana    Comment: "last time was in the 1990's"   Sexual activity: Not Currently  Other Topics Concern   Not on file  Social History Narrative   Not on file   Social Determinants of Health   Financial Resource Strain: Low Risk  (03/03/2022)   Overall Financial Resource Strain (CARDIA)    Difficulty of Paying Living Expenses: Not hard at all  Food Insecurity: No Food Insecurity (03/20/2022)   Hunger Vital Sign    Worried About Running Out of Food in the Last Year: Never true    Ran Out of Food in the Last Year: Never true  Transportation Needs: No Transportation Needs (03/20/2022)   PRAPARE - Hydrologist (Medical): No    Lack of Transportation (Non-Medical): No  Physical Activity: Sufficiently Active (03/03/2022)   Exercise Vital Sign     Days of Exercise per Week: 5 days    Minutes of Exercise per Session: 30 min  Stress: No Stress Concern Present (03/03/2022)   [place]    Feeling of Stress : Not at all  Social Connections: [place] (03/03/2022)   Social Connection and Isolation Panel [NHANES]    Frequency of Communication with Friends and Family: More than three times a week    Frequency of Social Gatherings with Friends and Family: More than three times a week    Attends Religious Services: More than 4 times per year    Active Member of [employer] or Organizations: Yes    Attends Music therapist: More than 4 times per year    Marital Status: Married  Human resources officer Violence: Not At Risk (03/03/2022)   Humiliation, Afraid, Rape, and Kick questionnaire    Fear of Current or Ex-Partner: No    Emotionally Abused: No    Physically Abused: No    Sexually Abused: No      Allergies  Allergen Reactions   Bendamustine Hcl Rash    See hospital notes from 01/12/15   Heparin Other (See Comments)    Opposite reaction Heparin induced thrombocytopenia Other reaction(s): Heparin-Induced Thrombocytopenia   Tape Rash     Outpatient Medications Prior to Visit  Medication Sig Dispense Refill   albuterol (PROVENTIL) (2.5 MG/3ML) 0.083% nebulizer solution INHALE 1 VIAL VIA NEBULIZER EVERY DAY AS NEEDED FOR WHEEZING/SHORTNESS OF BREATH 75 mL 5   amLODipine (NORVASC) 5 MG tablet Take 1 tablet (5 mg total) by mouth daily. 30 tablet 11   aspirin EC 81 MG tablet Take 81 mg by mouth every evening.      clonazePAM (KLONOPIN) 0.5 MG tablet TAKE 1 TABLET BY MOUTH TWICE A DAY AS NEEDED 60 tablet 3   clopidogrel (PLAVIX) 75 MG tablet TAKE 1 TABLET BY MOUTH EVERY DAY WITH BREAKFAST 90 tablet 3   metolazone (ZAROXOLYN) 5 MG tablet TAKE 1 TABLET BY MOUTH EVERY DAY 30 tablet 11   metoprolol succinate (TOPROL-XL) 50 MG 24 hr tablet Take 1 tablet (50 mg total)  by mouth daily. Take with or immediately following a meal. 90 tablet 3   oxyCODONE-acetaminophen (PERCOCET) 7.5-325 MG tablet Take 1 tablet by mouth every 6 (six) hours as needed for severe pain. 120 tablet 0   oxymetazoline (AFRIN) 0.05 % nasal spray Place 1 spray into both nostrils daily as needed for congestion.  simvastatin (ZOCOR) 20 MG tablet Take 1 tablet (20 mg total) by mouth at bedtime. 90 tablet 3   torsemide (DEMADEX) 100 MG tablet Take 1 tablet (100 mg total) by mouth daily. 90 tablet 1   nitroGLYCERIN (NITROSTAT) 0.4 MG SL tablet Place 1 tablet (0.4 mg total) under the tongue every 5 (five) minutes as needed for chest pain (Up to 3 doses). 25 tablet 1   Tiotropium Bromide Monohydrate (SPIRIVA RESPIMAT) 2.5 MCG/ACT AERS Inhale 2 puffs into the lungs daily. 4 g 5   No facility-administered medications prior to visit.         Objective:   Physical Exam  Vitals:   07/13/22 0930  BP: 130/72  Pulse: 63  Temp: 98.4 F (36.9 C)  TempSrc: Oral  SpO2: 96%  Weight: 242 lb (109.8 kg)  Height: 6' (1.829 m)   Gen: Pleasant, obese man, in no distress,  normal affect  ENT: No lesions,  mouth clear,  oropharynx clear, no postnasal drip  Neck: No JVD, no stridor  Lungs: No use of accessory muscles, distant, no crackles or wheezing on normal respiration, no wheeze on forced expiration  Cardiovascular: RRR, crescendo sys M with intact S2   Musculoskeletal: No deformities, no cyanosis or clubbing  Neuro: alert, awake, non focal  Skin: Warm, no lesions or rash     Assessment & Plan:   Mediastinal adenopathy Noted on CT scan of the chest, abdomen, pelvis when he was being evaluated for weight loss in the setting of his known follicular lymphoma.  He has a 1.8 cm node at station 4R, not particularly hypermetabolic on his PET scan.  Recommendation made to obtain tissue diagnosis via endobronchial ultrasound.  He understands and agrees.  We will try to get this set up for  07/24/2022.  He will need to be off Plavix for 5 days.  He has already undergone cardiac clearance and clearance to stop the Plavix temporarily.   We reviewed your CT scan and your PET scan today. We will arrange for bronchoscopy with endobronchial ultrasound to evaluate enlarged lymph node in the chest.  This will be done under general anesthesia as an outpatient at [facility] endoscopy.  We will try to get this set up for 07/24/2022.  You will need a designated driver.  You will need to stop your Plavix 5 days before the procedure.  You will need to stop your aspirin 2 days before the procedure. Depending on results we will plan follow-up with Dr. Gillis Ends, MD, PhD 07/13/2022, 10:23 AM [facility] [phone] or if no answer before 7:00PM call [phone] For any issues after 7:00PM please call eLink [phone]

## 2022-07-13 NOTE — Assessment & Plan Note (Signed)
Noted on CT scan of the chest, abdomen, pelvis when he was being evaluated for weight loss in the setting of his known follicular lymphoma.  He has a 1.8 cm node at station 4R, not particularly hypermetabolic on his PET scan.  Recommendation made to obtain tissue diagnosis via endobronchial ultrasound.  He understands and agrees.  We will try to get this set up for 07/24/2022.  He will need to be off Plavix for 5 days.  He has already undergone cardiac clearance and clearance to stop the Plavix temporarily.   We reviewed your CT scan and your PET scan today. We will arrange for bronchoscopy with endobronchial ultrasound to evaluate enlarged lymph node in the chest.  This will be done under general anesthesia as an outpatient at [facility] endoscopy.  We will try to get this set up for 07/24/2022.  You will need a designated driver.  You will need to stop your Plavix 5 days before the procedure.  You will need to stop your aspirin 2 days before the procedure. Depending on results we will plan follow-up with Dr. Alvy Bimler

## 2022-07-13 NOTE — Patient Instructions (Addendum)
We reviewed your CT scan and your PET scan today. We will arrange for bronchoscopy with endobronchial ultrasound to evaluate enlarged lymph node in the chest.  This will be done under general anesthesia as an outpatient at [facility] endoscopy.  We will try to get this set up for 07/24/2022.  You will need a designated driver.  You will need to stop your Plavix 5 days before the procedure.  You will need to stop your aspirin 2 days before the procedure. Depending on results we will plan follow-up with Dr. Alvy Bimler

## 2022-07-20 ENCOUNTER — Other Ambulatory Visit: Payer: Medicare Other

## 2022-07-20 ENCOUNTER — Other Ambulatory Visit: Payer: Self-pay | Admitting: *Deleted

## 2022-07-20 DIAGNOSIS — Z01812 Encounter for preprocedural laboratory examination: Secondary | ICD-10-CM

## 2022-07-21 ENCOUNTER — Other Ambulatory Visit: Payer: Self-pay

## 2022-07-21 ENCOUNTER — Encounter (HOSPITAL_COMMUNITY): Payer: Self-pay | Admitting: Emergency Medicine

## 2022-07-21 NOTE — Progress Notes (Signed)
SDW CALL  Patient was given pre-op instructions over the phone. Patient verbalized understanding of instructions provided.   PCP - Dr. Alain Marion Cardiologist -  Dr. Johnsie Cancel  Chest x-ray -  EKG - 04-12-22 Stress Test - 09-18-2011 ECHO - 04-25-22 Cardiac Cath - 12-07-2017   Blood Thinner Instructions: Plavix hold x5 days Aspirin Instructions: hold x2 days  ERAS Protcol - clears until 1015 PRE-SURGERY Ensure or G2-   COVID TEST- YES, done 07/20/22. Not resulted as of call. Pt states he had stuffy nose/chest cold 1 week ago but has since resolved.   Anesthesia review: yes  Patient denies shortness of breath, fever, cough and chest pain over the phone call

## 2022-07-21 NOTE — Progress Notes (Signed)
Anesthesia Chart Review: Same day workup  Follows with cardiology for history of CAD s/p CABG x 3 in 2005, DES-SVG-RCA in 2019, chronic DOE, carotid disease, HTN, HLD, dilated aortic root, cardiac murmur.  Echo 04/25/2022 showed EF 65%, grade 2 DD, moderately elevated PASP, mild-mod regurgitation, mild to moderate tricuspid regurgitation.  Last seen by cardiology APP Diona Browner, NP 06/30/2022 for preop evaluation prior to undergoing colonoscopy.  Per note, "According to the Revised Cardiac Risk Index (RCRI), his Perioperative Risk of Major Cardiac Event is (%): 0.9. His Functional Capacity in METs is: 4.95 according to the [facility] Activity Status Index (DASI).The patient was advised that if he develops new symptoms prior to surgery to contact our office to arrange for a follow-up visit, and he verbalized understanding. Most recent PCI to the SVG-RCA graft in 2019. Okay to hold Plavix x 5 days prior to procedure. Would prefer to continue ASA during this time, however if biopsies are planned or bleeding risk is too high, can hold ASA x 7 days prior to procedure. Please restart antiplatelet agents when medically safe to do so."  Former smoker, 82 pack years, with associated COPD.  He is maintained on Anoro Ellipta and as needed albuterol. . OSA, not on CPAP.  Uses 2 L of supplemental oxygen at night.  Follows with oncology for history of follicular lymphoma. He had new lymphadenopathy noted on recent chest imaging 05/15/2022, prompted PET scan and then referral for further evaluation of the lymphadenopathy.  Seen by Dr. Lamonte Sakai 07/13/2022 who recommended endobronchial ultrasound to obtain tissue diagnosis.  Instructed patient to be off Plavix for 5 days.  Patient will need day of surgery labs and evaluation.  EKG 04/12/2022: Sinus rhythm with sinus arrhythmia with first-degree AV block.  Rate 72.  Right bundle branch block.  Nonspecific T wave abnormality.  CT chest abdomen pelvis 05/15/2022: MPRESSION: 1.  Newly enlarged pretracheal lymph nodes measuring up to 1.8 x 1.3 cm , with additional newly prominent subcentimeter mediastinal and retroperitoneal lymph nodes in comparison to most recent examination is dated 2018. These are nonspecific and possibly reactive, however lymphomatous recurrence is not excluded and further surveillance or metabolic characterization by PET-CT may be considered. 2. Emphysema and diffuse bilateral bronchial wall thickening. 3. 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 4. Cardiomegaly and coronary artery disease. 5. Cirrhosis. 6. Cholelithiasis. 7. Bilateral nonobstructive nephrolithiasis.  TTE 04/25/2022:  1. Left ventricular ejection fraction, by estimation, is 60 to 65%. The  left ventricle has normal function. The left ventricle has no regional  wall motion abnormalities. There is mild left ventricular hypertrophy.  Left ventricular diastolic parameters  are consistent with Grade II diastolic dysfunction (pseudonormalization).   2. Right ventricular systolic function is normal. The right ventricular  size is mildly enlarged. There is moderately elevated pulmonary artery  systolic pressure. The estimated right ventricular systolic pressure is  93.7 mmHg.   3. Left atrial size was mildly dilated.   4. Right atrial size was mildly dilated.   5. The mitral valve is normal in structure. Mild mitral valve  regurgitation. No evidence of mitral stenosis.   6. Tricuspid valve regurgitation is mild to moderate.   7. The aortic valve is normal in structure. Aortic valve regurgitation is  not visualized. No aortic stenosis is present.   8. The inferior vena cava is normal in size with greater than 50%  respiratory variability, suggesting right atrial pressure of 3 mmHg.   Carotid duplex 02/01/2022: Summary:  Right  Carotid: Velocities in the right ICA are consistent with a 1-39% stenosis. The ECA appears >50% stenosed.  Left  Carotid: Velocities in the left ICA are consistent with a 1-39% stenosis. The ECA appears >50% stenosed. Patent left endartectomy site.  Vertebrals: Bilateral vertebral arteries demonstrate antegrade flow.  Subclavians: Left subclavian artery was stenotic. Normal flow hemodynamics were seen in the right subclavian artery.   Cath and PCI 12/07/2017: Prox RCA lesion is 100% stenosed. SVG graft was visualized by angiography and is normal in caliber. Prox Graft lesion is 99% stenosed. A drug-eluting stent was successfully placed using a STENT RESOLUTE ONYX 4.0X22. Post intervention, there is a 0% residual stenosis. Mid LM to Ost LAD lesion is 50% stenosed. Ost 1st Mrg lesion is 100% stenosed. Origin to Prox Graft lesion is 100% stenosed. Ost LAD to Prox LAD lesion is 90% stenosed. Hemodynamic findings consistent with moderate pulmonary hypertension.   1. Severe triple vessel CAD s/p 3V CABG with 2/3 patent bypass grafts 2. The left main has a distal 50% stenosis.  3. The LAD has severe proximal stenosis. The mid and distal LAD fills from the patent LIMA graft.  4. The Circumflex has mild diffuse disease. The intermediate is a large caliber vessel with proximal occlusion. The vein graft to the intermediate branch is occluded in the proximal stent.  5. The RCA is occluded in the proximal segment. The distal vessel fills from the patent vein graft. The stent in the proximal segment of the vein graft has severe in stent restenosis.  6. Elevated filling pressures   Recommendations: Will continue DAPT with ASA and Plavix. Continue beta blocker and statin. He would benefit from additional diuresis.     Wynonia Musty [facility] Short Stay Center/Anesthesiology Phone [phone] 07/21/2022 12:46 PM

## 2022-07-21 NOTE — Anesthesia Preprocedure Evaluation (Signed)
Anesthesia Evaluation  Patient identified by MRN, date of birth, ID band Patient awake    Reviewed: Allergy & Precautions, NPO status , Patient's Chart, lab work & pertinent test results, reviewed documented beta blocker date and time   Airway Mallampati: II  TM Distance: >3 FB Neck ROM: Full    Dental  (+) Edentulous Upper, Chipped, Dental Advisory Given, Poor Dentition   Pulmonary sleep apnea and Oxygen sleep apnea , COPD (2L O2 day and night),  oxygen dependent, Patient abstained from smoking., former smoker   Pulmonary exam normal breath sounds clear to auscultation       Cardiovascular hypertension, Pt. on medications and Pt. on home beta blockers + angina  + CAD, + Past MI, + Cardiac Stents, + CABG and +CHF  Normal cardiovascular exam+ dysrhythmias  Rhythm:Regular Rate:Normal  history of CAD s/p CABG x 3 in 2005, DES-SVG-RCA in 2019, chronic DOE, carotid disease, HTN, HLD, dilated aortic root, cardiac murmur  TTE 04/25/2022:  1. Left ventricular ejection fraction, by estimation, is 60 to 65%. The  left ventricle has normal function. The left ventricle has no regional  wall motion abnormalities. There is mild left ventricular hypertrophy.  Left ventricular diastolic parameters  are consistent with Grade II diastolic dysfunction (pseudonormalization).   2. Right ventricular systolic function is normal. The right ventricular  size is mildly enlarged. There is moderately elevated pulmonary artery  systolic pressure. The estimated right ventricular systolic pressure is  37.3 mmHg.   3. Left atrial size was mildly dilated.   4. Right atrial size was mildly dilated.   5. The mitral valve is normal in structure. Mild mitral valve  regurgitation. No evidence of mitral stenosis.   6. Tricuspid valve regurgitation is mild to moderate.   7. The aortic valve is normal in structure. Aortic valve regurgitation is  not visualized. No aortic  stenosis is present.   8. The inferior vena cava is normal in size with greater than 50%  respiratory variability, suggesting right atrial pressure of 3 mmHg.    Carotid duplex 02/01/2022: Summary:  Right Carotid: Velocities in the right ICA are consistent with a 1-39% stenosis. The ECA appears >50% stenosed.  Left Carotid: Velocities in the left ICA are consistent with a 1-39% stenosis. The ECA appears >50% stenosed. Patent left endartectomy site.  Vertebrals: Bilateral vertebral arteries demonstrate antegrade flow.  Subclavians: Left subclavian artery was stenotic. Normal flow hemodynamics were seen in the right subclavian artery.    Cath and PCI 12/07/2017:  Prox RCA lesion is 100% stenosed.  SVG graft was visualized by angiography and is normal in caliber.  Prox Graft lesion is 99% stenosed.  A drug-eluting stent was successfully placed using a STENT RESOLUTE ONYX 4.0X22.  Post intervention, there is a 0% residual stenosis.  Mid LM to Ost LAD lesion is 50% stenosed.  Ost 1st Mrg lesion is 100% stenosed.  Origin to Prox Graft lesion is 100% stenosed.  Ost LAD to Prox LAD lesion is 90% stenosed.  Hemodynamic findings consistent with moderate pulmonary hypertension.     Neuro/Psych  PSYCHIATRIC DISORDERS Anxiety Depression    negative neurological ROS     GI/Hepatic Neg liver ROS,GERD  ,,  Endo/Other  diabetes, Type 2    Renal/GU negative Renal ROS  negative genitourinary   Musculoskeletal negative musculoskeletal ROS (+)    Abdominal   Peds  Hematology  (+) Blood dyscrasia (plavix)   Anesthesia Other Findings   Reproductive/Obstetrics  Anesthesia Physical Anesthesia Plan  ASA: 3  Anesthesia Plan: General   Post-op Pain Management:    Induction: Intravenous  PONV Risk Score and Plan: 2 and Dexamethasone, Ondansetron and Treatment may vary due to age or medical condition  Airway Management Planned:  Oral ETT  Additional Equipment:   Intra-op Plan:   Post-operative Plan: Extubation in OR  Informed Consent: I have reviewed the patients History and Physical, chart, labs and discussed the procedure including the risks, benefits and alternatives for the proposed anesthesia with the patient or authorized representative who has indicated his/her understanding and acceptance.     Dental advisory given  Plan Discussed with: CRNA  Anesthesia Plan Comments:         Anesthesia Quick Evaluation

## 2022-07-22 ENCOUNTER — Other Ambulatory Visit: Payer: Self-pay | Admitting: Internal Medicine

## 2022-07-22 LAB — NOVEL CORONAVIRUS, NAA: SARS-CoV-2, NAA: NOT DETECTED

## 2022-07-24 ENCOUNTER — Encounter (HOSPITAL_COMMUNITY): Payer: Self-pay | Admitting: Emergency Medicine

## 2022-07-24 ENCOUNTER — Encounter (HOSPITAL_COMMUNITY)
Admission: RE | Disposition: A | Discharge status: Home / Self Care | Payer: Self-pay | Source: Home / Self Care | Attending: Emergency Medicine

## 2022-07-24 ENCOUNTER — Ambulatory Visit (HOSPITAL_COMMUNITY): Payer: Medicare Other | Admitting: Physician Assistant

## 2022-07-24 ENCOUNTER — Ambulatory Visit (HOSPITAL_BASED_OUTPATIENT_CLINIC_OR_DEPARTMENT_OTHER): Payer: Medicare Other | Admitting: Physician Assistant

## 2022-07-24 ENCOUNTER — Ambulatory Visit (HOSPITAL_COMMUNITY)
Admission: RE | Admit: 2022-07-24 | Discharge: 2022-07-24 | Disposition: A | Discharge status: Home / Self Care | Payer: Medicare Other | Attending: Emergency Medicine | Admitting: Emergency Medicine

## 2022-07-24 ENCOUNTER — Other Ambulatory Visit: Payer: Self-pay

## 2022-07-24 DIAGNOSIS — F32A Depression, unspecified: Secondary | ICD-10-CM | POA: Diagnosis not present

## 2022-07-24 DIAGNOSIS — J432 Centrilobular emphysema: Secondary | ICD-10-CM | POA: Diagnosis not present

## 2022-07-24 DIAGNOSIS — Z87891 Personal history of nicotine dependence: Secondary | ICD-10-CM

## 2022-07-24 DIAGNOSIS — Z8572 Personal history of non-Hodgkin lymphomas: Secondary | ICD-10-CM | POA: Insufficient documentation

## 2022-07-24 DIAGNOSIS — I509 Heart failure, unspecified: Secondary | ICD-10-CM | POA: Insufficient documentation

## 2022-07-24 DIAGNOSIS — Z951 Presence of aortocoronary bypass graft: Secondary | ICD-10-CM | POA: Diagnosis not present

## 2022-07-24 DIAGNOSIS — R590 Localized enlarged lymph nodes: Secondary | ICD-10-CM

## 2022-07-24 DIAGNOSIS — Z7902 Long term (current) use of antithrombotics/antiplatelets: Secondary | ICD-10-CM | POA: Insufficient documentation

## 2022-07-24 DIAGNOSIS — I25119 Atherosclerotic heart disease of native coronary artery with unspecified angina pectoris: Secondary | ICD-10-CM | POA: Diagnosis not present

## 2022-07-24 DIAGNOSIS — I252 Old myocardial infarction: Secondary | ICD-10-CM

## 2022-07-24 DIAGNOSIS — I10 Essential (primary) hypertension: Secondary | ICD-10-CM | POA: Insufficient documentation

## 2022-07-24 DIAGNOSIS — E119 Type 2 diabetes mellitus without complications: Secondary | ICD-10-CM | POA: Insufficient documentation

## 2022-07-24 DIAGNOSIS — I110 Hypertensive heart disease with heart failure: Secondary | ICD-10-CM | POA: Insufficient documentation

## 2022-07-24 DIAGNOSIS — E785 Hyperlipidemia, unspecified: Secondary | ICD-10-CM | POA: Insufficient documentation

## 2022-07-24 DIAGNOSIS — Z9981 Dependence on supplemental oxygen: Secondary | ICD-10-CM | POA: Insufficient documentation

## 2022-07-24 DIAGNOSIS — Z79899 Other long term (current) drug therapy: Secondary | ICD-10-CM | POA: Insufficient documentation

## 2022-07-24 DIAGNOSIS — Z6832 Body mass index (BMI) 32.0-32.9, adult: Secondary | ICD-10-CM | POA: Diagnosis not present

## 2022-07-24 DIAGNOSIS — R634 Abnormal weight loss: Secondary | ICD-10-CM | POA: Insufficient documentation

## 2022-07-24 DIAGNOSIS — F419 Anxiety disorder, unspecified: Secondary | ICD-10-CM | POA: Insufficient documentation

## 2022-07-24 DIAGNOSIS — G4733 Obstructive sleep apnea (adult) (pediatric): Secondary | ICD-10-CM | POA: Diagnosis not present

## 2022-07-24 DIAGNOSIS — I2510 Atherosclerotic heart disease of native coronary artery without angina pectoris: Secondary | ICD-10-CM | POA: Diagnosis not present

## 2022-07-24 HISTORY — PX: BRONCHIAL NEEDLE ASPIRATION BIOPSY: SHX5106

## 2022-07-24 HISTORY — PX: VIDEO BRONCHOSCOPY WITH ENDOBRONCHIAL ULTRASOUND: SHX6177

## 2022-07-24 LAB — CBC
HCT: 38.1 % — ABNORMAL LOW (ref 39.0–52.0)
Hemoglobin: 12.3 g/dL — ABNORMAL LOW (ref 13.0–17.0)
MCH: 33.2 pg (ref 26.0–34.0)
MCHC: 32.3 g/dL (ref 30.0–36.0)
MCV: 103 fL — ABNORMAL HIGH (ref 80.0–100.0)
Platelets: 91 10*3/uL — ABNORMAL LOW (ref 150–400)
RBC: 3.7 MIL/uL — ABNORMAL LOW (ref 4.22–5.81)
RDW: 14.2 % (ref 11.5–15.5)
WBC: 5.2 10*3/uL (ref 4.0–10.5)
nRBC: 0 % (ref 0.0–0.2)

## 2022-07-24 LAB — BASIC METABOLIC PANEL
Anion gap: 10 (ref 5–15)
BUN: 14 mg/dL (ref 8–23)
CO2: 34 mmol/L — ABNORMAL HIGH (ref 22–32)
Calcium: 9.3 mg/dL (ref 8.9–10.3)
Chloride: 94 mmol/L — ABNORMAL LOW (ref 98–111)
Creatinine, Ser: 0.98 mg/dL (ref 0.61–1.24)
GFR, Estimated: >60 mL/min (ref >60)
Glucose, Bld: 104 mg/dL — ABNORMAL HIGH (ref 70–99)
Potassium: 3.8 mmol/L (ref 3.5–5.1)
Sodium: 138 mmol/L (ref 135–145)

## 2022-07-24 LAB — GLUCOSE, CAPILLARY
Glucose-Capillary: 103 mg/dL — ABNORMAL HIGH (ref 70–99)
Glucose-Capillary: 89 mg/dL (ref 70–99)

## 2022-07-24 SURGERY — BRONCHOSCOPY, WITH EBUS
Anesthesia: General

## 2022-07-24 MED ORDER — INSULIN ASPART 100 UNIT/ML IJ SOLN: 0.0000 [IU] | INTRAMUSCULAR | Status: DC | PRN

## 2022-07-24 MED ORDER — CHLORHEXIDINE GLUCONATE 0.12 % MT SOLN
15.0000 mL | Freq: Once | OROMUCOSAL | Status: AC
  Administered 2022-07-24: 15 mL via OROMUCOSAL

## 2022-07-24 MED ORDER — CLONAZEPAM 0.5 MG PO TABS: 1.0000 mg | ORAL_TABLET | Freq: Every day | ORAL | Status: DC

## 2022-07-24 MED ORDER — ONDANSETRON HCL 4 MG/2ML IJ SOLN
INTRAMUSCULAR | Status: DC | PRN
  Administered 2022-07-24: 4 mg via INTRAVENOUS

## 2022-07-24 MED ORDER — CHLORHEXIDINE GLUCONATE 0.12 % MT SOLN
OROMUCOSAL | Status: AC
  Filled 2022-07-24: qty 15

## 2022-07-24 MED ORDER — CLOPIDOGREL BISULFATE 75 MG PO TABS: 75.0000 mg | ORAL_TABLET | Freq: Every day | ORAL | 3 refills | Status: DC

## 2022-07-24 MED ORDER — OXYCODONE-ACETAMINOPHEN 7.5-325 MG PO TABS: 1.0000 | ORAL_TABLET | Freq: Two times a day (BID) | ORAL | Status: DC

## 2022-07-24 MED ORDER — PROPOFOL 10 MG/ML IV BOLUS
INTRAVENOUS | Status: DC | PRN
  Administered 2022-07-24: 140 mg via INTRAVENOUS

## 2022-07-24 MED ORDER — FENTANYL CITRATE (PF) 100 MCG/2ML IJ SOLN
INTRAMUSCULAR | Status: AC
  Filled 2022-07-24: qty 2

## 2022-07-24 MED ORDER — DEXAMETHASONE SODIUM PHOSPHATE 10 MG/ML IJ SOLN
INTRAMUSCULAR | Status: DC | PRN
  Administered 2022-07-24: 10 mg via INTRAVENOUS

## 2022-07-24 MED ORDER — ROCURONIUM BROMIDE 10 MG/ML (PF) SYRINGE
PREFILLED_SYRINGE | INTRAVENOUS | Status: DC | PRN
  Administered 2022-07-24: 60 mg via INTRAVENOUS

## 2022-07-24 MED ORDER — LACTATED RINGERS IV SOLN: INTRAVENOUS | Status: DC

## 2022-07-24 MED ORDER — LIDOCAINE 2% (20 MG/ML) 5 ML SYRINGE
INTRAMUSCULAR | Status: DC | PRN
  Administered 2022-07-24: 80 mg via INTRAVENOUS

## 2022-07-24 MED ORDER — METOLAZONE 5 MG PO TABS: 5.0000 mg | ORAL_TABLET | Freq: Every day | ORAL | Status: DC | PRN

## 2022-07-24 MED ORDER — SUGAMMADEX SODIUM 200 MG/2ML IV SOLN
INTRAVENOUS | Status: DC | PRN
  Administered 2022-07-24: 200 mg via INTRAVENOUS

## 2022-07-24 MED ORDER — PHENYLEPHRINE 80 MCG/ML (10ML) SYRINGE FOR IV PUSH (FOR BLOOD PRESSURE SUPPORT)
PREFILLED_SYRINGE | INTRAVENOUS | Status: DC | PRN
  Administered 2022-07-24: 160 ug via INTRAVENOUS

## 2022-07-24 NOTE — Anesthesia Procedure Notes (Signed)
Procedure Name: Intubation Date/Time: 07/24/2022 1:50 PM  Performed by: Reeves Dam, CRNAPre-anesthesia Checklist: Patient identified, Emergency Drugs available, Suction available and Patient being monitored Patient Re-evaluated:Patient Re-evaluated prior to induction Oxygen Delivery Method: Circle system utilized Preoxygenation: Pre-oxygenation with 100% oxygen Induction Type: IV induction Ventilation: Mask ventilation without difficulty Laryngoscope Size: Miller and 2 Grade View: Grade I Tube type: Oral Tube size: 8.5 mm Number of attempts: 1 Airway Equipment and Method: Stylet and Oral airway Placement Confirmation: ETT inserted through vocal cords under direct vision, positive ETCO2 and breath sounds checked- equal and bilateral Secured at: 23 cm Tube secured with: Tape Dental Injury: Teeth and Oropharynx as per pre-operative assessment

## 2022-07-24 NOTE — Progress Notes (Signed)
Dr. Levy Sjogren made aware of patient's platelet count from today's CBC.

## 2022-07-24 NOTE — Progress Notes (Signed)
Pt s/p bronchoscopy. Unable to wean to patient back to room air. Incentive spirometer done with patient. Patient states that he does wear 2L of oxygen at home but only when he is sitting down not up moving around or when he goes outside of his home. Patient states he sometimes checks his oxygen sats at home and he normally sits around 87- 89 % on room air. Pt educated on the importance of wearing his oxygen at home and when not at home to maintain saturation of at least 90 and above. Dr. Lamonte Sakai updated on patients status and is ok with patient being sent home on his portable oxygen as long as his saturations are ok. Son sent home to get oxygen tank and bring it back to the hospital before patient can be discharged. Will continue to monitor o2 sats until time of discharge. Patient is otherwise stable with no complaints of difficult breathing or shortness of breath.

## 2022-07-24 NOTE — Anesthesia Postprocedure Evaluation (Signed)
Anesthesia Post Note  Patient: [name]  Procedure(s) Performed: VIDEO BRONCHOSCOPY WITH ENDOBRONCHIAL ULTRASOUND BRONCHIAL NEEDLE ASPIRATION BIOPSIES     Patient location during evaluation: PACU Anesthesia Type: General Level of consciousness: awake and alert Pain management: pain level controlled Vital Signs Assessment: post-procedure vital signs reviewed and stable Respiratory status: spontaneous breathing, nonlabored ventilation, respiratory function stable and patient connected to nasal cannula oxygen Cardiovascular status: blood pressure returned to baseline and stable Postop Assessment: no apparent nausea or vomiting Anesthetic complications: no   No notable events documented.  Last Vitals:  Vitals:   07/24/22 1710 07/24/22 1714  BP:    Pulse: 69 70  Resp:    Temp:    SpO2: 95% 92%    Last Pain:  Vitals:   07/24/22 1650  TempSrc:   PainSc: 0-No pain                 Santa Lighter

## 2022-07-24 NOTE — Transfer of Care (Signed)
Immediate Anesthesia Transfer of Care Note  Patient: [name]  Procedure(s) Performed: VIDEO BRONCHOSCOPY WITH ENDOBRONCHIAL ULTRASOUND BRONCHIAL NEEDLE ASPIRATION BIOPSIES  Patient Location: PACU  Anesthesia Type:General  Level of Consciousness: awake, alert , and oriented  Airway & Oxygen Therapy: Patient Spontanous Breathing and Patient connected to face mask oxygen  Post-op Assessment: Report given to RN, Post -op Vital signs reviewed and stable, and Patient moving all extremities X 4  Post vital signs: Reviewed and stable  Last Vitals:  Vitals Value Taken Time  BP 118/76 07/24/22 1444  Temp    Pulse 64 07/24/22 1444  Resp 19 07/24/22 1444  SpO2 90 % 07/24/22 1444    Last Pain:  Vitals:   07/24/22 1058  TempSrc:   PainSc: 0-No pain      Patients Stated Pain Goal: 0 (75/79/72 8206)  Complications: No notable events documented.

## 2022-07-24 NOTE — Progress Notes (Signed)
Assumed care of pt from Benay Pillow, RN at 1600 (see previous progress note). Since that time, pt has continued to saturate 92-95% on 2 LPM by nasal cannula. Pt remains otherwise stable, denies shortness of breath, difficulty breathing, or pain. Reinforced education on importance of wearing O2 at home and when not at home to maintain O2 saturations >90% with patient and wife. Patient discharged to home on 2 LPM by nasal cannula on home portable oxygen.   Debarah Crape, RN 07/24/22 5:22 PM

## 2022-07-24 NOTE — Op Note (Signed)
Video Bronchoscopy with Endobronchial Ultrasound Procedure Note  Date of Operation: 07/24/2022  Pre-op Diagnosis: Mediastinal adenopathy  Post-op Diagnosis: Improved (decreased) adenopathy  Surgeon: Baltazar Apo  Assistants: None  Anesthesia: General endotracheal anesthesia  Operation: Flexible video fiberoptic bronchoscopy with endobronchial ultrasound and biopsies.  Estimated Blood Loss: Minimal  Complications: None apparent  Indications and History: [name] is a 66 y.o. male with history of follicular lymphoma.  He was found to have an enlarged 4R lymph node on surveillance CT scan.  Minimal hypermetabolism on PET scan.  Recommendation made to achieve a tissue diagnosis via bronchoscopy with endobronchial ultrasound and nodal biopsies.  The risks, benefits, complications, treatment options and expected outcomes were discussed with the patient.  The possibilities of pneumothorax, pneumonia, reaction to medication, pulmonary aspiration, perforation of a viscus, bleeding, failure to diagnose a condition and creating a complication requiring transfusion or operation were discussed with the patient who freely signed the consent.    Description of Procedure: The patient was examined in the preoperative area and history and data from the preprocedure consultation were reviewed. It was deemed appropriate to proceed.  The patient was taken to [facility] endoscopy room 3, identified as [name] and the procedure verified as Flexible Video Fiberoptic Bronchoscopy.  A Time Out was held and the above information confirmed. After being taken to the operating room general anesthesia was initiated and the patient  was orally intubated. The video fiberoptic bronchoscope was introduced via the endotracheal tube and a general inspection was performed which showed normal airways throughout.  There were tan secretions in all airways that were effectively suctioned. The standard scope was  then withdrawn and the endobronchial ultrasound was used to identify and characterize the peritracheal, hilar and bronchial lymph nodes. Inspection showed small nodes at station 4R, smaller than anticipated based on his CT chest and PET scan from 1 month prior.  There were no other enlarged nodes seen.  Using real-time ultrasound guidance Wang needle biopsies were take from Station 4R nodes and were sent for cytology. The patient tolerated the procedure well without apparent complications. There was no significant blood loss. The bronchoscope was withdrawn. Anesthesia was reversed and the patient was taken to the PACU for recovery.   Samples: 1. Wang needle biopsies from 4R node   Plans:  The patient will be discharged from the PACU to home when recovered from anesthesia. We will review the cytology, pathology and microbiology results with the patient when they become available. Outpatient followup will be with Dr. Alvy Bimler and Dr. Lamonte Sakai.    Baltazar Apo, MD, PhD 07/24/2022, 2:32 PM [facility] [phone] or if no answer before 7:00PM call [phone] For any issues after 7:00PM please call eLink [phone]

## 2022-07-24 NOTE — Discharge Instructions (Signed)
Flexible Bronchoscopy, Care After This sheet gives you information about how to care for yourself after your test. Your doctor may also give you more specific instructions. If you have problems or questions, contact your doctor. Follow these instructions at home: Eating and drinking When your numbness is gone and your cough and gag reflexes have come back, you may: Eat only soft foods. Slowly drink liquids. The day after the test, go back to your normal diet. Driving Do not drive for 24 hours if you were given a medicine to help you relax (sedative). Do not drive or use heavy machinery while taking prescription pain medicine. General instructions  Take over-the-counter and prescription medicines only as told by your doctor. Return to your normal activities as told. Ask what activities are safe for you. Do not use any products that have nicotine or tobacco in them. This includes cigarettes and e-cigarettes. If you need help quitting, ask your doctor. Keep all follow-up visits as told by your doctor. This is important. It is very important if you had a tissue sample (biopsy) taken. Get help right away if: You have shortness of breath that gets worse. You get light-headed. You feel like you are going to pass out (faint). You have chest pain. You cough up: More than a little blood. More blood than before. Summary Do not eat or drink anything (not even water) for 2 hours after your test, or until your numbing medicine wears off. Do not use cigarettes. Do not use e-cigarettes. Get help right away if you have chest pain.  Please call our office for any questions or concerns.  [phone].  This information is not intended to replace advice given to you by your health care provider. Make sure you discuss any questions you have with your health care provider. Document Released: 05/28/2009 Document Revised: 07/13/2017 Document Reviewed: 08/18/2016 Elsevier Patient Education  2020 Elsevier  Inc.  

## 2022-07-24 NOTE — Interval H&P Note (Signed)
History and Physical Interval Note:  07/24/2022 11:19 AM  [name]  has presented today for surgery, with the diagnosis of MEDIASTINAL ADENOPATHY.  The various methods of treatment have been discussed with the patient and family. After consideration of risks, benefits and other options for treatment, the patient has consented to  Procedure(s): [place] (N/A) as a surgical intervention.  The patient's history has been reviewed, patient examined, no change in status, stable for surgery.  I have reviewed the patient's chart and labs.  Questions were answered to the patient's satisfaction.     Collene Gobble

## 2022-07-26 LAB — CYTOLOGY - NON PAP

## 2022-07-27 ENCOUNTER — Encounter (HOSPITAL_COMMUNITY): Payer: Self-pay | Admitting: Emergency Medicine

## 2022-07-27 ENCOUNTER — Telehealth: Payer: Self-pay | Admitting: Emergency Medicine

## 2022-07-27 NOTE — Telephone Encounter (Signed)
Reviewed the patient's EBUS results with him today.  His 4R lymph node was not as prominent on ultrasound inspection as it was on his CT scan from 05/15/2022 or PET scan from 06/19/2022.  The biopsies were negative for any malignancy or abnormality.  Reassured him about this information.  FYI to Dr. Alvy Bimler

## 2022-07-28 NOTE — Telephone Encounter (Signed)
Thank you for the updates.  

## 2022-08-16 ENCOUNTER — Encounter (HOSPITAL_COMMUNITY): Payer: Self-pay | Admitting: Gastroenterology

## 2022-08-22 ENCOUNTER — Encounter (HOSPITAL_COMMUNITY): Payer: Self-pay | Admitting: Gastroenterology

## 2022-08-22 ENCOUNTER — Ambulatory Visit (HOSPITAL_COMMUNITY): Payer: Medicare Other | Admitting: Certified Registered"

## 2022-08-22 ENCOUNTER — Ambulatory Visit (HOSPITAL_BASED_OUTPATIENT_CLINIC_OR_DEPARTMENT_OTHER): Payer: Medicare Other | Admitting: Certified Registered"

## 2022-08-22 ENCOUNTER — Encounter (HOSPITAL_COMMUNITY)
Admission: RE | Disposition: A | Discharge status: Home / Self Care | Payer: Self-pay | Source: Home / Self Care | Attending: Gastroenterology

## 2022-08-22 ENCOUNTER — Ambulatory Visit (HOSPITAL_COMMUNITY)
Admission: RE | Admit: 2022-08-22 | Discharge: 2022-08-22 | Disposition: A | Discharge status: Home / Self Care | Payer: Medicare Other | Attending: Gastroenterology | Admitting: Gastroenterology

## 2022-08-22 ENCOUNTER — Other Ambulatory Visit: Payer: Self-pay

## 2022-08-22 DIAGNOSIS — K219 Gastro-esophageal reflux disease without esophagitis: Secondary | ICD-10-CM | POA: Insufficient documentation

## 2022-08-22 DIAGNOSIS — I509 Heart failure, unspecified: Secondary | ICD-10-CM

## 2022-08-22 DIAGNOSIS — E669 Obesity, unspecified: Secondary | ICD-10-CM | POA: Insufficient documentation

## 2022-08-22 DIAGNOSIS — I2510 Atherosclerotic heart disease of native coronary artery without angina pectoris: Secondary | ICD-10-CM | POA: Insufficient documentation

## 2022-08-22 DIAGNOSIS — D120 Benign neoplasm of cecum: Secondary | ICD-10-CM | POA: Diagnosis not present

## 2022-08-22 DIAGNOSIS — F419 Anxiety disorder, unspecified: Secondary | ICD-10-CM | POA: Insufficient documentation

## 2022-08-22 DIAGNOSIS — J449 Chronic obstructive pulmonary disease, unspecified: Secondary | ICD-10-CM | POA: Insufficient documentation

## 2022-08-22 DIAGNOSIS — Z951 Presence of aortocoronary bypass graft: Secondary | ICD-10-CM | POA: Insufficient documentation

## 2022-08-22 DIAGNOSIS — I252 Old myocardial infarction: Secondary | ICD-10-CM | POA: Diagnosis not present

## 2022-08-22 DIAGNOSIS — K5730 Diverticulosis of large intestine without perforation or abscess without bleeding: Secondary | ICD-10-CM | POA: Diagnosis not present

## 2022-08-22 DIAGNOSIS — K3189 Other diseases of stomach and duodenum: Secondary | ICD-10-CM

## 2022-08-22 DIAGNOSIS — F1120 Opioid dependence, uncomplicated: Secondary | ICD-10-CM | POA: Diagnosis not present

## 2022-08-22 DIAGNOSIS — E119 Type 2 diabetes mellitus without complications: Secondary | ICD-10-CM | POA: Insufficient documentation

## 2022-08-22 DIAGNOSIS — Z8572 Personal history of non-Hodgkin lymphomas: Secondary | ICD-10-CM | POA: Insufficient documentation

## 2022-08-22 DIAGNOSIS — G4730 Sleep apnea, unspecified: Secondary | ICD-10-CM | POA: Diagnosis not present

## 2022-08-22 DIAGNOSIS — I081 Rheumatic disorders of both mitral and tricuspid valves: Secondary | ICD-10-CM | POA: Diagnosis not present

## 2022-08-22 DIAGNOSIS — I110 Hypertensive heart disease with heart failure: Secondary | ICD-10-CM | POA: Insufficient documentation

## 2022-08-22 DIAGNOSIS — I4891 Unspecified atrial fibrillation: Secondary | ICD-10-CM | POA: Diagnosis not present

## 2022-08-22 DIAGNOSIS — Z87891 Personal history of nicotine dependence: Secondary | ICD-10-CM | POA: Insufficient documentation

## 2022-08-22 DIAGNOSIS — I272 Pulmonary hypertension, unspecified: Secondary | ICD-10-CM | POA: Diagnosis not present

## 2022-08-22 DIAGNOSIS — Z79899 Other long term (current) drug therapy: Secondary | ICD-10-CM | POA: Diagnosis not present

## 2022-08-22 DIAGNOSIS — I25119 Atherosclerotic heart disease of native coronary artery with unspecified angina pectoris: Secondary | ICD-10-CM

## 2022-08-22 DIAGNOSIS — F1290 Cannabis use, unspecified, uncomplicated: Secondary | ICD-10-CM | POA: Insufficient documentation

## 2022-08-22 DIAGNOSIS — F32A Depression, unspecified: Secondary | ICD-10-CM | POA: Diagnosis not present

## 2022-08-22 DIAGNOSIS — K5520 Angiodysplasia of colon without hemorrhage: Secondary | ICD-10-CM | POA: Diagnosis not present

## 2022-08-22 DIAGNOSIS — G8929 Other chronic pain: Secondary | ICD-10-CM | POA: Insufficient documentation

## 2022-08-22 DIAGNOSIS — K269 Duodenal ulcer, unspecified as acute or chronic, without hemorrhage or perforation: Secondary | ICD-10-CM | POA: Insufficient documentation

## 2022-08-22 DIAGNOSIS — Z955 Presence of coronary angioplasty implant and graft: Secondary | ICD-10-CM | POA: Insufficient documentation

## 2022-08-22 DIAGNOSIS — Z6832 Body mass index (BMI) 32.0-32.9, adult: Secondary | ICD-10-CM | POA: Insufficient documentation

## 2022-08-22 DIAGNOSIS — R634 Abnormal weight loss: Secondary | ICD-10-CM | POA: Insufficient documentation

## 2022-08-22 DIAGNOSIS — M545 Low back pain, unspecified: Secondary | ICD-10-CM | POA: Insufficient documentation

## 2022-08-22 DIAGNOSIS — I4510 Unspecified right bundle-branch block: Secondary | ICD-10-CM | POA: Insufficient documentation

## 2022-08-22 DIAGNOSIS — K2950 Unspecified chronic gastritis without bleeding: Secondary | ICD-10-CM | POA: Diagnosis not present

## 2022-08-22 DIAGNOSIS — E785 Hyperlipidemia, unspecified: Secondary | ICD-10-CM | POA: Insufficient documentation

## 2022-08-22 HISTORY — PX: BIOPSY: SHX5522

## 2022-08-22 HISTORY — PX: COLONOSCOPY WITH PROPOFOL: SHX5780

## 2022-08-22 HISTORY — PX: POLYPECTOMY: SHX5525

## 2022-08-22 HISTORY — PX: ESOPHAGOGASTRODUODENOSCOPY (EGD) WITH PROPOFOL: SHX5813

## 2022-08-22 SURGERY — ESOPHAGOGASTRODUODENOSCOPY (EGD) WITH PROPOFOL
Anesthesia: Monitor Anesthesia Care

## 2022-08-22 MED ORDER — SODIUM CHLORIDE 0.9 % IV SOLN: INTRAVENOUS | Status: DC

## 2022-08-22 MED ORDER — PROPOFOL 10 MG/ML IV BOLUS
INTRAVENOUS | Status: DC | PRN
  Administered 2022-08-22 (×2): 50 mg via INTRAVENOUS

## 2022-08-22 MED ORDER — PROPOFOL 500 MG/50ML IV EMUL
INTRAVENOUS | Status: DC | PRN
  Administered 2022-08-22: 150 ug/kg/min via INTRAVENOUS

## 2022-08-22 MED ORDER — DEXMEDETOMIDINE HCL IN NACL 80 MCG/20ML IV SOLN
INTRAVENOUS | Status: DC | PRN
  Administered 2022-08-22 (×3): 4 ug via BUCCAL

## 2022-08-22 MED ORDER — GLYCOPYRROLATE 0.2 MG/ML IJ SOLN
INTRAMUSCULAR | Status: DC | PRN
  Administered 2022-08-22: .1 mg via INTRAVENOUS

## 2022-08-22 MED ORDER — LACTATED RINGERS IV SOLN: Freq: Once | INTRAVENOUS | Status: AC

## 2022-08-22 SURGICAL SUPPLY — 25 items

## 2022-08-22 NOTE — Anesthesia Postprocedure Evaluation (Signed)
Anesthesia Post Note  Patient: Reinhart Saulters  Procedure(s) Performed: ESOPHAGOGASTRODUODENOSCOPY (EGD) WITH PROPOFOL COLONOSCOPY WITH PROPOFOL BIOPSY POLYPECTOMY     Patient location during evaluation: Endoscopy Anesthesia Type: MAC Level of consciousness: awake and alert Pain management: pain level controlled Vital Signs Assessment: post-procedure vital signs reviewed and stable Respiratory status: spontaneous breathing, nonlabored ventilation, respiratory function stable and patient connected to nasal cannula oxygen Cardiovascular status: blood pressure returned to baseline and stable Postop Assessment: no apparent nausea or vomiting Anesthetic complications: no  No notable events documented.  Last Vitals:  Vitals:   08/22/22 1255 08/22/22 1300  BP:  (!) 141/58  Pulse: 71 71  Resp: (!) 21 (!) 25  Temp:    SpO2: 95% 95%    Last Pain:  Vitals:   08/22/22 1254  TempSrc:   PainSc: 0-No pain                 Huck Ashworth L Jodell Weitman

## 2022-08-22 NOTE — Op Note (Signed)
[facility] Patient Name: [name] Procedure Date: 08/22/2022 MRN: [id number] Attending MD: Carol Ada , MD, [id number] Date of Birth: [birth year] CSN: [id number] Age: 67 Admit Type: Outpatient Procedure:                Upper GI endoscopy Indications:              Weight loss Providers:                Carol Ada, MD, Adah Perl RN, RN,                            William Dalton, Technician Referring MD:              Medicines:                Propofol per Anesthesia Complications:            No immediate complications. Estimated Blood Loss:     Estimated blood loss: none. Procedure:                Pre-Anesthesia Assessment:                           - Prior to the procedure, a History and Physical                            was performed, and patient medications and                            allergies were reviewed. The patient's tolerance of                            previous anesthesia was also reviewed. The risks                            and benefits of the procedure and the sedation                            options and risks were discussed with the patient.                            All questions were answered, and informed consent                            was obtained. Prior Anticoagulants: The patient has                            taken no anticoagulant or antiplatelet agents. ASA                            Grade Assessment: III - A patient with severe                            systemic disease. After reviewing the risks and  benefits, the patient was deemed in satisfactory                            condition to undergo the procedure.                           - Sedation was administered by an anesthesia                            professional. Deep sedation was attained.                           After obtaining informed consent, the endoscope was                            passed under direct vision.  Throughout the                            procedure, the patient's blood pressure, pulse, and                            oxygen saturations were monitored continuously. The                            PCF-HQ190L ([id number]) Olympus colonoscope was                            introduced through the mouth, and advanced to the                            second part of duodenum. The upper GI endoscopy was                            accomplished without difficulty. The patient                            tolerated the procedure well. Scope In: Scope Out: Findings:      The esophagus was normal.      Multiple dispersed small erosions with no stigmata of recent bleeding       were found in the gastric antrum. Biopsies were taken with a cold       forceps for Helicobacter pylori testing.      Multiple diffuse erosions without bleeding were found in the duodenal       bulb. Impression:               - Normal esophagus.                           - Erosive gastropathy with no stigmata of recent                            bleeding. Biopsied.                           - Duodenal erosions without bleeding. Moderate Sedation:  Not Applicable - Patient had care per Anesthesia. Recommendation:           - Patient has a contact number available for                            emergencies. The signs and symptoms of potential                            delayed complications were discussed with the                            patient. Return to normal activities tomorrow.                            Written discharge instructions were provided to the                            patient.                           - Resume previous diet.                           - Continue present medications.                           - Await pathology results. Procedure Code(s):        --- Professional ---                           [phone], Esophagogastroduodenoscopy, flexible,                            transoral; with  biopsy, single or multiple Diagnosis Code(s):        --- Professional ---                           K31.89, Other diseases of stomach and duodenum                           K26.9, Duodenal ulcer, unspecified as acute or                            chronic, without hemorrhage or perforation                           R63.4, Abnormal weight loss CPT copyright 2022 American Medical Association. All rights reserved. The codes documented in this report are preliminary and upon coder review may  be revised to meet current compliance requirements. Carol Ada, MD Carol Ada, MD 08/22/2022 12:44:00 PM This report has been signed electronically. Number of Addenda: 0

## 2022-08-22 NOTE — H&P (Signed)
[name] HPI: At this time the patient denies any problems with nausea, vomiting, fevers, chills, abdominal pain, diarrhea, hematochezia, melena, or dysphagia. The patient denies any known family history of colon cancers. No complaints of chest pain, SOB, MI, or sleep apnea.  Intermittently he will use an over-the-counter laxative for his constipation.  Omeprazole was prescribed for his GERD and he feels that it does improve his gas sensation.  Over the past 3 years he reports a significant amount of weight.  In one month he lost 25 lbs unintentionally.  Dr. Alvy Bimler referred the patient for further work up of the weight loss.  Currently he is 5 years in remission from his lymphoma.  There is no evidence of any recurrence.  A recent cardiac echo shows that his EF is in the 60% range.  The last time he used nitrostat was 10-20 years ago.  The patient is going to see Dr. [name] Caller for his SOB/COPD and he uses oxygen at home.   Past Medical History:  Diagnosis Date   Anginal pain ([place])    not had to use in awhile  none in 10 years   Anxiety    Basal cell carcinoma of nose    Blood dyscrasia    trouble clotting    Carotid artery disease ([place])    s/p L CEA 2009 (hx of evacuation of hematoma due to heparin)   CHF (congestive heart failure) (HCC)    Chronic lower back pain    COPD (chronic obstructive pulmonary disease) ([place])    Coronary artery disease    CABG 2005. s/p PTCA and stenting of the saphenous vein graft to PDA and saphenous vein graft to obtuse marginal by Dr. Burt Knack 09/29/11. Normal EF at cath 09/2011   Depression    Diabetes mellitus without complication (HCC)    borderline    Dysrhythmia    fluttering   Edema    GERD (gastroesophageal reflux disease)    Heart murmur    Heparin induced thrombocytopenia (HCC)    History of home oxygen therapy    2 liters at night prn   Hyperlipidemia    Hypertension    Lymphoma ([place]) 12/21/2014   Myocardial infarction ([place])    Obesity     Shortness of breath dyspnea    with exertion    Sleep apnea    "used to"      could not use 2006    Past Surgical History:  Procedure Laterality Date   BASAL CELL CARCINOMA EXCISION  2000's   nose   BRONCHIAL NEEDLE ASPIRATION BIOPSY  07/24/2022   Procedure: BRONCHIAL NEEDLE ASPIRATION BIOPSIES;  Surgeon: Collene Gobble, MD;  Location: [facility];  Service: Cardiopulmonary;;   CARDIAC CATHETERIZATION  12/07/2017   CAROTID ENDARTERECTOMY  03/2008   left   CORONARY ANGIOPLASTY WITH STENT PLACEMENT  09/29/11   "2"   CORONARY ARTERY BYPASS GRAFT  2005   CABG X3   CORONARY STENT INTERVENTION  12/07/2017   CORONARY STENT INTERVENTION N/A 12/07/2017   Procedure: CORONARY STENT INTERVENTION;  Surgeon: Burnell Blanks, MD;  Location: [facility];  Service: Cardiovascular;  Laterality: N/A;   evacuation of hematoma  03/2008   left neck; S/P endarterectomy; "cause heparin clotted it up"   I & D EXTREMITY Left 02/11/2015   Procedure: IRRIGATION AND DEBRIDEMENT LEFT LONG FINGER;  Surgeon: Leanora Cover, MD;  Location: [facility];  Service: Orthopedics;  Laterality: Left;  I and D Left Long  Finger   IR REMOVAL TUN ACCESS W/ PORT W/O FL MOD SED  05/18/2017   MASS EXCISION Left 12/11/2014   Procedure: EXCISIONAL BIOPSY OF LEFT SUPRA CLAVICULAR NECK MASS;  Surgeon: Jerrell Belfast, MD;  Location: [facility];  Service: ENT;  Laterality: Left;   PERCUTANEOUS CORONARY STENT INTERVENTION (PCI-S) N/A 09/29/2011   Procedure: PERCUTANEOUS CORONARY STENT INTERVENTION (PCI-S);  Surgeon: Sherren Mocha, MD;  Location[facility];  Service: Cardiovascular;  Laterality: N/A;   PORTACATH PLACEMENT  2016   RIGHT/LEFT HEART CATH AND CORONARY/GRAFT ANGIOGRAPHY N/A 12/07/2017   Procedure: RIGHT/LEFT HEART CATH AND CORONARY/GRAFT ANGIOGRAPHY;  Surgeon: Burnell Blanks, MD;  Location: [facility];  Service: Cardiovascular;  Laterality: N/A;   SUPERFICIAL LYMPH NODE BIOPSY / EXCISION  Left    VIDEO BRONCHOSCOPY WITH ENDOBRONCHIAL ULTRASOUND N/A 07/24/2022   Procedure: VIDEO BRONCHOSCOPY WITH ENDOBRONCHIAL ULTRASOUND;  Surgeon: Collene Gobble, MD;  Location: [facility];  Service: Cardiopulmonary;  Laterality: N/A;    Family History  Problem Relation Age of Onset   Heart disease Father    Bone cancer Mother     Social History:  reports that he quit smoking about 4 years ago. His smoking use included cigarettes. He has a 82.00 pack-year smoking history. He has quit using smokeless tobacco.  His smokeless tobacco use included chew. He reports current drug use. Drug: Marijuana. He reports that he does not drink alcohol.  Allergies:  Allergies  Allergen Reactions   Bendamustine Hcl     See hospital notes from 01/12/15, legs turned black   Heparin Other (See Comments)    Heparin induced thrombocytopenia    Tape Rash    If left on for prolong periods of time     Medications: Scheduled: Continuous:  sodium chloride     lactated ringers      No results found for this or any previous visit (from the past 24 hour(s)).   No results found.  ROS:  As stated above in the HPI otherwise negative.  Blood pressure (!) 174/65, temperature 98 F (36.7 C), temperature source Tympanic, resp. rate 16, height 6' (1.829 m), weight 108.9 kg, SpO2 95 %.    PE: Gen: NAD, Alert and Oriented HEENT:  [place]/AT, EOMI Neck: Supple, no LAD Lungs: CTA Bilaterally CV: RRR without M/G/R ABD: Soft, NTND, +BS Ext: No C/C/E  Assessment/Plan: 1) GERD. 2) Weight loss.  Plan: 1) EGD/colonoscopy.  [name] D 08/22/2022, 12:04 PM

## 2022-08-22 NOTE — Transfer of Care (Signed)
Immediate Anesthesia Transfer of Care Note  Patient: [name]  Procedure(s) Performed: ESOPHAGOGASTRODUODENOSCOPY (EGD) WITH PROPOFOL COLONOSCOPY WITH PROPOFOL BIOPSY POLYPECTOMY  Patient Location: PACU and Endoscopy Unit  Anesthesia Type:MAC  Level of Consciousness: drowsy  Airway & Oxygen Therapy: Patient Spontanous Breathing and Patient connected to face mask oxygen  Post-op Assessment: Report given to RN and Post -op Vital signs reviewed and stable  Post vital signs: Reviewed and stable  Last Vitals:  Vitals Value Taken Time  BP 106/49 08/22/22 1240  Temp    Pulse 63 08/22/22 1240  Resp 10 08/22/22 1240  SpO2 100 % 08/22/22 1240  Vitals shown include unvalidated device data.  Last Pain:  Vitals:   08/22/22 1054  TempSrc: Tympanic  PainSc: 0-No pain         Complications: No notable events documented.

## 2022-08-22 NOTE — Discharge Instructions (Signed)

## 2022-08-22 NOTE — Anesthesia Preprocedure Evaluation (Addendum)
Anesthesia Evaluation  Patient identified by MRN, date of birth, ID band Patient awake    Reviewed: Allergy & Precautions, NPO status , Patient's Chart, lab work & pertinent test results, reviewed documented beta blocker date and time   Airway Mallampati: III  TM Distance: >3 FB Neck ROM: Full    Dental  (+) Edentulous Upper, Dental Advisory Given   Pulmonary sleep apnea and Oxygen sleep apnea , COPD (2L O2 at night),  COPD inhaler and oxygen dependent, Patient abstained from smoking., former smoker   Pulmonary exam normal breath sounds clear to auscultation       Cardiovascular hypertension, Pt. on home beta blockers and Pt. on medications pulmonary hypertension+ angina  + CAD, + Past MI and +CHF  Normal cardiovascular exam Rhythm:Regular Rate:Normal  TTE 2023 1. Left ventricular ejection fraction, by estimation, is 60 to 65%. The  left ventricle has normal function. The left ventricle has no regional  wall motion abnormalities. There is mild left ventricular hypertrophy.  Left ventricular diastolic parameters  are consistent with Grade II diastolic dysfunction (pseudonormalization).   2. Right ventricular systolic function is normal. The right ventricular  size is mildly enlarged. There is moderately elevated pulmonary artery  systolic pressure. The estimated right ventricular systolic pressure is  86.7 mmHg.   3. Left atrial size was mildly dilated.   4. Right atrial size was mildly dilated.   5. The mitral valve is normal in structure. Mild mitral valve  regurgitation. No evidence of mitral stenosis.   6. Tricuspid valve regurgitation is mild to moderate.   7. The aortic valve is normal in structure. Aortic valve regurgitation is  not visualized. No aortic stenosis is present.   8. The inferior vena cava is normal in size with greater than 50%  respiratory variability, suggesting right atrial pressure of 3 mmHg.   Cath  2019 1. Severe triple vessel CAD s/p 3V CABG with 2/3 patent bypass grafts 2. The left main has a distal 50% stenosis.  3. The LAD has severe proximal stenosis. The mid and distal LAD fills from the patent LIMA graft.  4. The Circumflex has mild diffuse disease. The intermediate is a large caliber vessel with proximal occlusion. The vein graft to the intermediate branch is occluded in the proximal stent.  5. The RCA is occluded in the proximal segment. The distal vessel fills from the patent vein graft. The stent in the proximal segment of the vein graft has severe in stent restenosis.  6. Elevated filling pressures     Neuro/Psych  PSYCHIATRIC DISORDERS Anxiety Depression    negative neurological ROS     GI/Hepatic ,GERD  ,,(+)     substance abuse    Endo/Other  diabetes    Renal/GU negative Renal ROS  negative genitourinary   Musculoskeletal negative musculoskeletal ROS (+)  narcotic dependent  Abdominal   Peds  Hematology  (+) Blood dyscrasia (plavix,)   Anesthesia Other Findings   Reproductive/Obstetrics                             Anesthesia Physical Anesthesia Plan  ASA: 3  Anesthesia Plan: MAC   Post-op Pain Management:    Induction: Intravenous  PONV Risk Score and Plan: Propofol infusion and Treatment may vary due to age or medical condition  Airway Management Planned: Natural Airway  Additional Equipment:   Intra-op Plan:   Post-operative Plan:   Informed Consent: I have reviewed the  patients History and Physical, chart, labs and discussed the procedure including the risks, benefits and alternatives for the proposed anesthesia with the patient or authorized representative who has indicated his/her understanding and acceptance.     Dental advisory given  Plan Discussed with: CRNA  Anesthesia Plan Comments:        Anesthesia Quick Evaluation

## 2022-08-22 NOTE — Progress Notes (Signed)
3 lead EKG was unclear in pre-op, 12 Lead EKG obtained. Dr. Lanetta Inch reviewed and pt is ok to proceed with the procedure. Jobe Igo, RN

## 2022-08-22 NOTE — Op Note (Signed)
[facility] Patient Name: [name] Procedure Date: 08/22/2022 MRN: [id number] Attending MD: Carol Ada , MD, [id number] Date of Birth: 1956/03/06 CSN: [id number] Age: 67 Admit Type: Outpatient Procedure:                Colonoscopy Indications:              Weight loss Providers:                Carol Ada, MD, Adah Perl RN, RN,                            William Dalton, Technician Referring MD:              Medicines:                 Complications:            No immediate complications. Estimated Blood Loss:     Estimated blood loss: none. Procedure:                Pre-Anesthesia Assessment:                           - Prior to the procedure, a History and Physical                            was performed, and patient medications and                            allergies were reviewed. The patient's tolerance of                            previous anesthesia was also reviewed. The risks                            and benefits of the procedure and the sedation                            options and risks were discussed with the patient.                            All questions were answered, and informed consent                            was obtained. Prior Anticoagulants: The patient has                            taken no anticoagulant or antiplatelet agents. ASA                            Grade Assessment: III - A patient with severe                            systemic disease. After reviewing the risks and                            benefits, the patient  was deemed in satisfactory                            condition to undergo the procedure.                           - Sedation was administered by an anesthesia                            professional. Deep sedation was attained.                           After obtaining informed consent, the colonoscope                            was passed under direct vision. Throughout the                             procedure, the patient's blood pressure, pulse, and                            oxygen saturations were monitored continuously. The                            PCF-HQ190L ([id number] Olympus colonoscope was                            introduced through the anus and advanced to the the                            cecum, identified by appendiceal orifice and                            ileocecal valve. The colonoscopy was somewhat                            difficult due to a redundant colon. Successful                            completion of the procedure was aided by using                            manual pressure and straightening and shortening                            the scope to obtain bowel loop reduction. The                            patient tolerated the procedure well. The quality                            of the bowel preparation was evaluated using the  BBPS [facility] Bowel Preparation Scale) with scores                            of: Right Colon = 2 (minor amount of residual                            staining, small fragments of stool and/or opaque                            liquid, but mucosa seen well), Transverse Colon = 2                            (minor amount of residual staining, small fragments                            of stool and/or opaque liquid, but mucosa seen                            well) and Left Colon = 2 (minor amount of residual                            staining, small fragments of stool and/or opaque                            liquid, but mucosa seen well). The total BBPS score                            equals 6. The quality of the bowel preparation was                            good. The ileocecal valve, appendiceal orifice, and                            rectum were photographed. Scope In: 12:19:29 PM Scope Out: 12:34:13 PM Scope Withdrawal Time: 0 hours 10 minutes 32 seconds  Total Procedure Duration: 0 hours 14  minutes 44 seconds  Findings:      A 3 mm polyp was found in the cecum. The polyp was sessile. The polyp       was removed with a cold snare. Resection and retrieval were complete.      A single medium-sized localized angiodysplastic lesion without bleeding       was found in the ascending colon.      A single medium-mouthed diverticulum was found in the sigmoid colon. Impression:               - One 3 mm polyp in the cecum, removed with a cold                            snare. Resected and retrieved.                           - A single non-bleeding colonic angiodysplastic  lesion.                           - Diverticulosis in the sigmoid colon. Moderate Sedation:      Not Applicable - Patient had care per Anesthesia. Recommendation:           - Patient has a contact number available for                            emergencies. The signs and symptoms of potential                            delayed complications were discussed with the                            patient. Return to normal activities tomorrow.                            Written discharge instructions were provided to the                            patient.                           - Resume previous diet.                           - Continue present medications.                           - Await pathology results.                           - Repeat colonoscopy in 7 years for surveillance. Procedure Code(s):        --- Professional ---                           [phone], Colonoscopy, flexible; with removal of                            tumor(s), polyp(s), or other lesion(s) by snare                            technique Diagnosis Code(s):        --- Professional ---                           D12.0, Benign neoplasm of cecum                           K55.20, Angiodysplasia of colon without hemorrhage                           R63.4, Abnormal weight loss                           K57.30, Diverticulosis  of large intestine without  perforation or abscess without bleeding CPT copyright 2022 [employer]. All rights reserved. The codes documented in this report are preliminary and upon coder review may  be revised to meet current compliance requirements. Carol Ada, MD Carol Ada, MD 08/22/2022 12:40:26 PM This report has been signed electronically. Number of Addenda: 0

## 2022-08-23 ENCOUNTER — Encounter: Payer: Self-pay | Admitting: Internal Medicine

## 2022-08-23 ENCOUNTER — Ambulatory Visit (INDEPENDENT_AMBULATORY_CARE_PROVIDER_SITE_OTHER): Payer: Medicare Other | Admitting: Internal Medicine

## 2022-08-23 VITALS — BP 130/62 | HR 63 | Temp 99.9°F | Ht 72.0 in | Wt 243.0 lb

## 2022-08-23 DIAGNOSIS — M79604 Pain in right leg: Secondary | ICD-10-CM | POA: Diagnosis not present

## 2022-08-23 DIAGNOSIS — R599 Enlarged lymph nodes, unspecified: Secondary | ICD-10-CM

## 2022-08-23 DIAGNOSIS — M545 Low back pain, unspecified: Secondary | ICD-10-CM

## 2022-08-23 DIAGNOSIS — M79605 Pain in left leg: Secondary | ICD-10-CM

## 2022-08-23 DIAGNOSIS — R634 Abnormal weight loss: Secondary | ICD-10-CM

## 2022-08-23 DIAGNOSIS — E1151 Type 2 diabetes mellitus with diabetic peripheral angiopathy without gangrene: Secondary | ICD-10-CM | POA: Diagnosis not present

## 2022-08-23 LAB — SURGICAL PATHOLOGY

## 2022-08-23 MED ORDER — OXYCODONE-ACETAMINOPHEN 7.5-325 MG PO TABS: 1.0000 | ORAL_TABLET | Freq: Three times a day (TID) | ORAL | 0 refills | Status: DC | PRN

## 2022-08-23 MED ORDER — CLONAZEPAM 0.5 MG PO TABS: 1.0000 mg | ORAL_TABLET | Freq: Every day | ORAL | 2 refills | Status: DC

## 2022-08-23 NOTE — Assessment & Plan Note (Signed)
  On diet  

## 2022-08-23 NOTE — Assessment & Plan Note (Signed)
Cont on Percocet prn Not to take w/Clonazepam  Potential benefits of a long term opioids use as well as potential risks (i.e. addiction risk, apnea etc) and complications (i.e. Somnolence, constipation and others) were explained to the patient and were aknowledged.

## 2022-08-23 NOTE — Assessment & Plan Note (Signed)
Resolved Wt Readings from Last 3 Encounters:  08/23/22 243 lb (110.2 kg)  08/22/22 240 lb (108.9 kg)  07/24/22 240 lb (108.9 kg)

## 2022-08-23 NOTE — Progress Notes (Signed)
Subjective:  Patient ID: [name], male    DOB: 12/29/1955  Age: 67 y.o. MRN: [id number]  CC: No chief complaint on file.   HPI [name] presents for chronic pain, anxiety, wt loss F/u mediastinal adenopathy - w/up by Dr Lamonte Sakai  Outpatient Medications Prior to Visit  Medication Sig Dispense Refill   albuterol (PROVENTIL) (2.5 MG/3ML) 0.083% nebulizer solution INHALE 1 VIAL VIA NEBULIZER EVERY DAY AS NEEDED FOR WHEEZING/SHORTNESS OF BREATH 75 mL 5   aspirin EC 81 MG tablet Take 81 mg by mouth every evening.      aspirin-sod bicarb-citric acid (ALKA-SELTZER) 325 MG TBEF tablet Take 650 mg by mouth every 6 (six) hours as needed (indigestion).     clonazePAM (KLONOPIN) 0.5 MG tablet Take 2 tablets (1 mg total) by mouth at bedtime.     clopidogrel (PLAVIX) 75 MG tablet Take 1 tablet (75 mg total) by mouth daily. Okay to restart this medication on 07/25/2022 90 tablet 3   metolazone (ZAROXOLYN) 5 MG tablet Take 1 tablet (5 mg total) by mouth daily as needed (swelling).     metoprolol succinate (TOPROL-XL) 50 MG 24 hr tablet Take 1 tablet (50 mg total) by mouth daily. Take with or immediately following a meal. 90 tablet 3   oxyCODONE-acetaminophen (PERCOCET) 7.5-325 MG tablet Take 1 tablet by mouth 2 (two) times daily. (Patient taking differently: Take 1 tablet by mouth 3 (three) times daily as needed for moderate pain.)     oxymetazoline (AFRIN) 0.05 % nasal spray Place 1 spray into both nostrils 2 (two) times daily as needed for congestion.     simvastatin (ZOCOR) 20 MG tablet Take 1 tablet (20 mg total) by mouth at bedtime. 90 tablet 3   torsemide (DEMADEX) 100 MG tablet TAKE 1 TABLET BY MOUTH EVERY DAY (Patient taking differently: Take 100 mg by mouth daily as needed (swelling).) 90 tablet 1   nitroGLYCERIN (NITROSTAT) 0.4 MG SL tablet Place 1 tablet (0.4 mg total) under the tongue every 5 (five) minutes as needed for chest pain (Up to 3 doses). 25 tablet 1    umeclidinium-vilanterol (ANORO ELLIPTA) 62.5-25 MCG/ACT AEPB Inhale 1 puff into the lungs daily as needed (shortness of breath). (Patient not taking: Reported on 08/23/2022)     No facility-administered medications prior to visit.    ROS: Review of Systems  Constitutional:  Positive for fatigue and unexpected weight change. Negative for appetite change.  HENT:  Negative for congestion, nosebleeds, sneezing, sore throat and trouble swallowing.   Eyes:  Negative for itching and visual disturbance.  Respiratory:  Negative for cough.   Cardiovascular:  Positive for leg swelling. Negative for chest pain and palpitations.  Gastrointestinal:  Negative for abdominal distention, blood in stool, diarrhea and nausea.  Genitourinary:  Negative for frequency and hematuria.  Musculoskeletal:  Positive for back pain and gait problem. Negative for joint swelling and neck pain.  Skin:  Negative for rash.  Neurological:  Negative for dizziness, tremors, speech difficulty and weakness.  Psychiatric/Behavioral:  Negative for agitation, dysphoric mood and sleep disturbance. The patient is not nervous/anxious.     Objective:  BP 130/62 (BP Location: Left Arm, Patient Position: Sitting, Cuff Size: Large)   Pulse 63   Temp 99.9 F (37.7 C) (Oral)   Ht 6' (1.829 m)   Wt 243 lb (110.2 kg)   SpO2 95%   BMI 32.96 kg/m   BP Readings from Last 3 Encounters:  08/23/22 130/62  08/22/22 (!) 141/58  07/24/22 132/60    Wt Readings from Last 3 Encounters:  08/23/22 243 lb (110.2 kg)  08/22/22 240 lb (108.9 kg)  07/24/22 240 lb (108.9 kg)    Physical Exam Constitutional:      General: He is not in acute distress.    Appearance: He is well-developed. He is obese.     Comments: NAD  Eyes:     Conjunctiva/sclera: Conjunctivae normal.     Pupils: Pupils are equal, round, and reactive to light.  Neck:     Thyroid: No thyromegaly.     Vascular: No JVD.  Cardiovascular:     Rate and Rhythm: Normal rate and  regular rhythm.     Heart sounds: Normal heart sounds. No murmur heard.    No friction rub. No gallop.  Pulmonary:     Effort: Pulmonary effort is normal. No respiratory distress.     Breath sounds: Normal breath sounds. No wheezing or rales.  Chest:     Chest wall: No tenderness.  Abdominal:     General: Bowel sounds are normal. There is no distension.     Palpations: Abdomen is soft. There is no mass.     Tenderness: There is no abdominal tenderness. There is no guarding or rebound.  Musculoskeletal:        General: Tenderness present. Normal range of motion.     Cervical back: Normal range of motion.     Right lower leg: Edema present.     Left lower leg: Edema present.  Lymphadenopathy:     Cervical: No cervical adenopathy.  Skin:    General: Skin is warm and dry.     Findings: No rash.  Neurological:     Mental Status: He is alert and oriented to person, place, and time.     Cranial Nerves: No cranial nerve deficit.     Motor: No abnormal muscle tone.     Coordination: Coordination abnormal.     Gait: Gait abnormal.     Deep Tendon Reflexes: Reflexes are normal and symmetric.  Psychiatric:        Behavior: Behavior normal.        Thought Content: Thought content normal.        Judgment: Judgment normal.    LS w/pain   Lab Results  Component Value Date   WBC 5.2 07/24/2022   HGB 12.3 (L) 07/24/2022   HCT 38.1 (L) 07/24/2022   PLT 91 (L) 07/24/2022   GLUCOSE 104 (H) 07/24/2022   CHOL 108 01/01/2018   TRIG 98 01/01/2018   HDL 35 (L) 01/01/2018   LDLCALC 53 01/01/2018   ALT 13 05/11/2022   AST 29 05/11/2022   NA 138 07/24/2022   K 3.8 07/24/2022   CL 94 (L) 07/24/2022   CREATININE 0.98 07/24/2022   BUN 14 07/24/2022   CO2 34 (H) 07/24/2022   TSH 0.87 09/26/2021   PSA 0.23 09/15/2011   INR 1.0 11/28/2017   HGBA1C 5.2 09/26/2021   MICROALBUR 0.7 03/29/2016    No results found.  Assessment & Plan:   Problem List Items Addressed This Visit        Endocrine   Diabetes mellitus type 2, controlled ([place]) (Chronic)    On diet        Immune and Lymphatic   Enlarged lymph nodes    Recent  mediastinal adenopathy - (-)  w/up by Dr Lamonte Sakai        Other   Low back pain radiating to both  legs - Primary    Cont on Percocet prn Not to take w/Clonazepam  Potential benefits of a long term opioids use as well as potential risks (i.e. addiction risk, apnea etc) and complications (i.e. Somnolence, constipation and others) were explained to the patient and were aknowledged.         No orders of the defined types were placed in this encounter.     Follow-up: No follow-ups on file.  Walker Kehr, MD

## 2022-08-23 NOTE — Assessment & Plan Note (Signed)
Recent  mediastinal adenopathy - (-)  w/up by Dr Lamonte Sakai

## 2022-08-24 ENCOUNTER — Encounter (HOSPITAL_COMMUNITY): Payer: Self-pay | Admitting: Gastroenterology

## 2022-08-29 ENCOUNTER — Encounter: Payer: Self-pay | Admitting: Internal Medicine

## 2022-08-29 ENCOUNTER — Other Ambulatory Visit: Payer: Self-pay

## 2022-08-29 MED ORDER — CLOPIDOGREL BISULFATE 75 MG PO TABS: 75.0000 mg | ORAL_TABLET | Freq: Every day | ORAL | 3 refills | Status: DC

## 2022-08-31 ENCOUNTER — Other Ambulatory Visit: Payer: Self-pay | Admitting: Internal Medicine

## 2022-09-15 NOTE — Progress Notes (Signed)
Cardiology Office Note:    Date:  09/15/2022   ID:  [name], [name] Oct 06, 1955, MRN [id number]  PCP:  [name], MD   [facility] Providers Cardiologist:  Jenkins Rouge, MD     Referring MD: [name], MD   Chief Complaint: follow-up CAD   History of Present Illness:    [name] is a pleasant 67 y.o. male with a hx of carotid artery disease s/p left CEA 2009, CAD s/p CABG 2005, obesity, hypertension, HLD, COPD and follicular lymphoma diagnosed 2016, OSA, and GERD.  Cardiac catheterization 09/29/2011 with patent LIMA to LAD.  Received DES to SVG ramus and DES to SVG PDA at that time.  Cardiac cath 12/07/2017 with severe in-stent restenosis of proximal RCA graft.  SVG to OM and LIMA to LAD patent.  Right heart with mean RA 15 mmHg, PA 48/26 mmHg, PCWP 26 mmHg.  Followed by pulmonology for COPD/asbestosis exposure.  Quit smoking. Port-A-Cath previously used for treatment for lymphoma has been removed. Had bronchoscopy by Dr Lamonte Sakai 07/24/22 for 4R lymph nodes on surveillance CT Minimal hypermetabolism on PET Biopsy negative   Functional status poor Sleeps in recliner has neuropathy   Did not like norvasc and went back on Toprol Discussed cutting dose to 25 mg as HR is low Frustrated with undiagnosed reason for weight loss    Past Medical History:  Diagnosis Date   Anginal pain ([place])    not had to use in awhile  none in 10 years   Anxiety    Basal cell carcinoma of nose    Blood dyscrasia    trouble clotting    Carotid artery disease ([place])    s/p L CEA 2009 (hx of evacuation of hematoma due to heparin)   CHF (congestive heart failure) (HCC)    Chronic lower back pain    COPD (chronic obstructive pulmonary disease) ([place])    Coronary artery disease    CABG 2005. s/p PTCA and stenting of the saphenous vein graft to PDA and saphenous vein graft to obtuse marginal by Dr. Burt Knack 09/29/11. Normal EF at cath 09/2011   Depression    Diabetes  mellitus without complication (HCC)    borderline    Dysrhythmia    fluttering   Edema    GERD (gastroesophageal reflux disease)    Heart murmur    Heparin induced thrombocytopenia (HCC)    History of home oxygen therapy    2 liters at night prn   Hyperlipidemia    Hypertension    Lymphoma ([place]) 12/21/2014   Myocardial infarction ([place])    Obesity    Shortness of breath dyspnea    with exertion    Sleep apnea    "used to"      could not use 2006    Past Surgical History:  Procedure Laterality Date   BASAL CELL CARCINOMA EXCISION  2000's   nose   BIOPSY  08/22/2022   Procedure: BIOPSY;  Surgeon: Carol Ada, MD;  Location: [facility];  Service: Gastroenterology;;   BRONCHIAL NEEDLE ASPIRATION BIOPSY  07/24/2022   Procedure: BRONCHIAL NEEDLE ASPIRATION BIOPSIES;  Surgeon: Collene Gobble, MD;  Location: [facility];  Service: Cardiopulmonary;;   CARDIAC CATHETERIZATION  12/07/2017   CAROTID ENDARTERECTOMY  03/2008   left   COLONOSCOPY WITH PROPOFOL N/A 08/22/2022   Procedure: COLONOSCOPY WITH PROPOFOL;  Surgeon: Carol Ada, MD;  Location: [facility];  Service: Gastroenterology;  Laterality: N/A;   CORONARY ANGIOPLASTY WITH STENT  PLACEMENT  09/29/11   "2"   CORONARY ARTERY BYPASS GRAFT  2005   CABG X3   CORONARY STENT INTERVENTION  12/07/2017   CORONARY STENT INTERVENTION N/A 12/07/2017   Procedure: CORONARY STENT INTERVENTION;  Surgeon: Burnell Blanks, MD;  Location: [facility];  Service: Cardiovascular;  Laterality: N/A;   ESOPHAGOGASTRODUODENOSCOPY (EGD) WITH PROPOFOL N/A 08/22/2022   Procedure: ESOPHAGOGASTRODUODENOSCOPY (EGD) WITH PROPOFOL;  Surgeon: Carol Ada, MD;  Location: [facility];  Service: Gastroenterology;  Laterality: N/A;   evacuation of hematoma  03/2008   left neck; S/P endarterectomy; "cause heparin clotted it up"   I & D EXTREMITY Left 02/11/2015   Procedure: IRRIGATION AND DEBRIDEMENT LEFT LONG FINGER;  Surgeon: Leanora Cover, MD;   Location: [place];  Service: Orthopedics;  Laterality: Left;  I and D Left Long Finger   IR REMOVAL TUN ACCESS W/ PORT W/O FL MOD SED  05/18/2017   MASS EXCISION Left 12/11/2014   Procedure: EXCISIONAL BIOPSY OF LEFT SUPRA CLAVICULAR NECK MASS;  Surgeon: Jerrell Belfast, MD;  Location: [facility];  Service: ENT;  Laterality: Left;   PERCUTANEOUS CORONARY STENT INTERVENTION (PCI-S) N/A 09/29/2011   Procedure: PERCUTANEOUS CORONARY STENT INTERVENTION (PCI-S);  Surgeon: Sherren Mocha, MD;  Location: [facility];  Service: Cardiovascular;  Laterality: N/A;   POLYPECTOMY  08/22/2022   Procedure: POLYPECTOMY;  Surgeon: Carol Ada, MD;  Location: [facility];  Service: Gastroenterology;;   Sol Passer PLACEMENT  2016   RIGHT/LEFT HEART CATH AND CORONARY/GRAFT ANGIOGRAPHY N/A 12/07/2017   Procedure: RIGHT/LEFT HEART CATH AND CORONARY/GRAFT ANGIOGRAPHY;  Surgeon: Burnell Blanks, MD;  Location: [place] [facility];  Service: Cardiovascular;  Laterality: N/A;   SUPERFICIAL LYMPH NODE BIOPSY / EXCISION Left    VIDEO BRONCHOSCOPY WITH ENDOBRONCHIAL ULTRASOUND N/A 07/24/2022   Procedure: VIDEO BRONCHOSCOPY WITH ENDOBRONCHIAL ULTRASOUND;  Surgeon: Collene Gobble, MD;  Location: [place];  Service: Cardiopulmonary;  Laterality: N/A;    Current Medications: No outpatient medications have been marked as taking for the 09/22/22 encounter (Appointment) with [name], MD.     Allergies:   Bendamustine hcl, Heparin, and Tape   Social History   Socioeconomic History   Marital status: Married    Spouse name: Not on file   Number of children: 1   Years of education: Not on file   Highest education level: Not on file  Occupational History   Occupation: cable Hotel manager  Tobacco Use   Smoking status: Former    Packs/day: 2.00    Years: 41.00    Total pack years: 82.00    Types: Cigarettes    Quit date: 11/23/2017    Years since quitting: 4.8   Smokeless tobacco: Former     Types: Chew   Tobacco comments:    consult entered  Vaping Use   Vaping Use: Never used  Substance and Sexual Activity   Alcohol use: No    Alcohol/week: 0.0 standard drinks of alcohol    Comment: 09/29/11 "quit years ago"   Drug use: Yes    Types: Marijuana    Comment: as of 07/21/22: occasional pipe last use 07/20/22   Sexual activity: Not Currently  Other Topics Concern   Not on file  Social History Narrative   Not on file   Social Determinants of Health   Financial Resource Strain: Low Risk  (03/03/2022)   Overall Financial Resource Strain (CARDIA)    Difficulty of Paying Living Expenses: Not hard at all  Food Insecurity:  No Food Insecurity (03/20/2022)   Hunger Vital Sign    Worried About Running Out of Food in the Last Year: Never true    Ran Out of Food in the Last Year: Never true  Transportation Needs: No Transportation Needs (03/20/2022)   PRAPARE - Hydrologist (Medical): No    Lack of Transportation (Non-Medical): No  Physical Activity: Sufficiently Active (03/03/2022)   Exercise Vital Sign    Days of Exercise per Week: 5 days    Minutes of Exercise per Session: 30 min  Stress: No Stress Concern Present (03/03/2022)   [place]    Feeling of Stress : Not at all  Social Connections: [place] (03/03/2022)   Social Connection and Isolation Panel [NHANES]    Frequency of Communication with Friends and Family: More than three times a week    Frequency of Social Gatherings with Friends and Family: More than three times a week    Attends Religious Services: More than 4 times per year    Active Member of [employer] or Organizations: Yes    Attends Music therapist: More than 4 times per year    Marital Status: Married     Family History: The patient's family history includes Bone cancer in his mother; Heart disease in his father.  ROS:   Please see the history  of present illness.    + chronic DOE + chronic bilateral LE edema All other systems reviewed and are negative.  Labs/Other Studies Reviewed:    The following studies were reviewed today:  Carotid Duplex 02/01/22  Right Carotid: Velocities in the right ICA are consistent with a 1-39%  stenosis.                The ECA appears >50% stenosed.   Left Carotid: Velocities in the left ICA are consistent with a 1-39%  stenosis.               The ECA appears >50% stenosed. Patent left endartectomy  site.   Vertebrals: Bilateral vertebral arteries demonstrate antegrade flow.  Subclavians: Left subclavian artery was stenotic. Normal flow hemodynamics  were              seen in the right subclavian artery.   *See table(s) above for measurements and observations.  Suggest follow up study in 12 months.   Echo 11/23/17  - Left ventricle: The cavity size was mildly dilated. There was    moderate concentric hypertrophy. Systolic function was normal.    The estimated ejection fraction was in the range of 55% to 60%.    Wall motion was normal; there were no regional wall motion    abnormalities. Left ventricular diastolic function parameters    were normal.  - Aortic valve: There was trivial regurgitation.  - Aorta: Aortic root dimension: 40 mm (ED).  - Aortic root: The aortic root was mildly dilated.  - Left atrium: The atrium was moderately dilated.  - Right ventricle: The cavity size was mildly dilated. Wall    thickness was normal. Systolic function was mildly reduced.    R/LHC 12/07/17  Prox RCA lesion is 100% stenosed. SVG graft was visualized by angiography and is normal in caliber. Prox Graft lesion is 99% stenosed. A drug-eluting stent was successfully placed using a STENT RESOLUTE ONYX 4.0X22. Post intervention, there is a 0% residual stenosis. Mid LM to Ost LAD lesion is 50% stenosed. Ost 1st Mrg  lesion is 100% stenosed. Origin to Prox Graft lesion is 100% stenosed. Ost LAD  to Prox LAD lesion is 90% stenosed. Hemodynamic findings consistent with moderate pulmonary hypertension.   1. Severe triple vessel CAD s/p 3V CABG with 2/3 patent bypass grafts 2. The left main has a distal 50% stenosis.  3. The LAD has severe proximal stenosis. The mid and distal LAD fills from the patent LIMA graft.  4. The Circumflex has mild diffuse disease. The intermediate is a large caliber vessel with proximal occlusion. The vein graft to the intermediate branch is occluded in the proximal stent.  5. The RCA is occluded in the proximal segment. The distal vessel fills from the patent vein graft. The stent in the proximal segment of the vein graft has severe in stent restenosis.  6. Elevated filling pressures   Recommendations: Will continue DAPT with ASA and Plavix. Continue beta blocker and statin. He would benefit from additional diuresis.   Diagnostic Dominance: Right  Intervention      Recent Labs: 09/26/2021: TSH 0.87 05/11/2022: ALT 13 07/24/2022: BUN 14; Creatinine, Ser 0.98; Hemoglobin 12.3; Platelets 91; Potassium 3.8; Sodium 138  Recent Lipid Panel    Component Value Date/Time   CHOL 108 01/01/2018 0932   TRIG 98 01/01/2018 0932   HDL 35 (L) 01/01/2018 0932   CHOLHDL 3.1 01/01/2018 0932   CHOLHDL 5 03/25/2014 1631   VLDL 37.0 03/25/2014 1631   LDLCALC 53 01/01/2018 0932     Risk Assessment/Calculations:       Physical Exam:    VS:  There were no vitals taken for this visit.    Wt Readings from Last 3 Encounters:  08/23/22 243 lb (110.2 kg)  08/22/22 240 lb (108.9 kg)  07/24/22 240 lb (108.9 kg)     Affect appropriate Chronically ill male  HEENT: normal Neck supple with no adenopathy JVP normal bilateral  bruits no thyromegaly Lungs clear with no wheezing and good diaphragmatic motion Heart:  S1/S2 SEM murmur, no rub, gallop or click PMI normal Abdomen: benighn, BS positve, no tenderness, no AAA no bruit.  No HSM or HJR Distal pulses  intact with no bruits Plus 2 bilateral brawny edema Neuro non-focal Skin warm and dry No muscular weakness   EKG:  EKG is ordered today.  The ekg ordered today demonstrates sinus rhythm at 72 bpm with sinus arrhythmia with 1st degree AV block with PR 248 ms, RBBB, nonspecific ST abnormality     Diagnoses:    No diagnosis found.  Assessment and Plan:     CAD without angina: S/p CABG x 3 2005. Most recent cardiac catheterization 11/2017 revealed 2/3 patent bypass grafts, vein graft to intermediate branch occluded in proximal stent, PCI/DES to SVG to RCA. He denies chest pain, dyspnea, or other symptoms concerning for angina.  No indication for further ischemic evaluation at this time. Continue GDMT including aspirin, clopidogrel, metoprolol, simvastatin.  Chronic DOE: Felt to be currently stable per patient. EF 55-60% on cath 11/2017, elevated filling pressures on right heart cath 11/2017. Likely multifactorial in the setting of COPD, obesity, diastolic dysfunction. He takes metolazone 5 mg and torsemide 100 mg as needed for increased DOE or evidence of fluid retention.  See below regarding lungs  Murmur: Soft systolic  murmur on exam. TTE 04/25/22 EF [id number] grade 2 diastolic dysfunction mild MR normal AV   Hypertension: BP is elevated. Does not monitor at home. Reports elevated readings at recent appointments with other providers.  We will add  amlodipine 5 mg once daily.  He states he will follow-up with PCP for BP monitoring.  Carotid artery disease: S/p left CEA 2009. Mild bilateral carotid artery disease. Stable plaque no stenosis duplex 01/22/21   Hyperlipidemia: LDL 53 in 2019. Says he gets frequent lab work with PCP but there is no record of recent lipid. Continue simvastatin.  Aortic root dilatation: Aortic root 40 mm on echo 11/2017. Observe  8.  Pulmonary:  F/U [place] recent biopsy negative for recurrent lymphoma   9.  Weight Loss:  undiagnosed etiology Occult cancer w/u negative  so far F/U Alvy Bimler      Disposition: 6 months    Medication Adjustments/Labs and Tests Ordered: Current medicines are reviewed at length with the patient today.  Concerns regarding medicines are outlined above.  No orders of the defined types were placed in this encounter.  No orders of the defined types were placed in this encounter.   There are no Patient Instructions on file for this visit.   Signed, Jenkins Rouge, MD  09/15/2022 9:21 AM    [place]

## 2022-09-20 ENCOUNTER — Encounter: Payer: Self-pay | Admitting: Internal Medicine

## 2022-09-22 ENCOUNTER — Ambulatory Visit: Payer: Medicare Other | Attending: Cardiovascular Disease | Admitting: Cardiovascular Disease

## 2022-09-22 ENCOUNTER — Encounter: Payer: Self-pay | Admitting: Cardiovascular Disease

## 2022-09-22 VITALS — BP 128/62 | HR 55 | Ht 72.0 in | Wt 243.0 lb

## 2022-09-22 DIAGNOSIS — E785 Hyperlipidemia, unspecified: Secondary | ICD-10-CM | POA: Diagnosis not present

## 2022-09-22 DIAGNOSIS — I2510 Atherosclerotic heart disease of native coronary artery without angina pectoris: Secondary | ICD-10-CM | POA: Diagnosis not present

## 2022-09-22 DIAGNOSIS — I10 Essential (primary) hypertension: Secondary | ICD-10-CM | POA: Diagnosis present

## 2022-09-22 DIAGNOSIS — Z9889 Other specified postprocedural states: Secondary | ICD-10-CM | POA: Insufficient documentation

## 2022-09-22 DIAGNOSIS — R011 Cardiac murmur, unspecified: Secondary | ICD-10-CM | POA: Insufficient documentation

## 2022-09-22 DIAGNOSIS — I77810 Thoracic aortic ectasia: Secondary | ICD-10-CM | POA: Insufficient documentation

## 2022-09-22 MED ORDER — TIOTROPIUM BROMIDE MONOHYDRATE 18 MCG IN CAPS: 18.0000 ug | ORAL_CAPSULE | Freq: Every day | RESPIRATORY_TRACT | 11 refills | Status: DC

## 2022-09-22 MED ORDER — METOPROLOL SUCCINATE ER 25 MG PO TB24: 25.0000 mg | ORAL_TABLET | Freq: Every day | ORAL | 3 refills | Status: DC

## 2022-09-22 NOTE — Patient Instructions (Addendum)
Medication Instructions:  Your physician has recommended you make the following change in your medication:  1-STOP Norvasc 2-DECREASED Metoprolol 25 mg by mouth daily 3-Take Spiriva daily  *If you need a refill on your cardiac medications before your next appointment, please call your pharmacy*  Lab Work: If you have labs (blood work) drawn today and your tests are completely normal, you will receive your results only by: [place] (if you have MyChart) OR A paper copy in the mail If you have any lab test that is abnormal or we need to change your treatment, we will call you to review the results.  Follow-Up: At [facility], you and your health needs are our priority.  As part of our continuing mission to provide you with exceptional heart care, we have created designated Provider Care Teams.  These Care Teams include your primary Cardiologist (physician) and Advanced Practice Providers (APPs -  Physician Assistants and Nurse Practitioners) who all work together to provide you with the care you need, when you need it.  We recommend signing up for the patient portal called "MyChart".  Sign up information is provided on this After Visit Summary.  MyChart is used to connect with patients for Virtual Visits (Telemedicine).  Patients are able to view lab/test results, encounter notes, upcoming appointments, etc.  Non-urgent messages can be sent to your provider as well.   To learn more about what you can do with MyChart, go to [email or web address].    Your next appointment:   6 month(s)  Provider:   Jenkins Rouge, MD

## 2022-09-26 ENCOUNTER — Encounter: Payer: Self-pay | Admitting: Internal Medicine

## 2022-09-27 ENCOUNTER — Other Ambulatory Visit: Payer: Self-pay | Admitting: Internal Medicine

## 2022-09-27 MED ORDER — OXYCODONE-ACETAMINOPHEN 7.5-325 MG PO TABS: 1.0000 | ORAL_TABLET | Freq: Three times a day (TID) | ORAL | 0 refills | Status: DC | PRN

## 2022-09-29 ENCOUNTER — Encounter: Payer: Self-pay | Admitting: Cardiovascular Disease

## 2022-09-29 ENCOUNTER — Other Ambulatory Visit: Payer: Self-pay | Admitting: Cardiovascular Disease

## 2022-09-29 MED ORDER — TIOTROPIUM BROMIDE MONOHYDRATE 18 MCG IN CAPS: 18.0000 ug | ORAL_CAPSULE | Freq: Every day | RESPIRATORY_TRACT | 11 refills | Status: DC

## 2022-10-22 ENCOUNTER — Other Ambulatory Visit: Payer: Self-pay | Admitting: Internal Medicine

## 2022-10-23 ENCOUNTER — Encounter: Payer: Self-pay | Admitting: Internal Medicine

## 2022-11-10 ENCOUNTER — Encounter: Payer: Self-pay | Admitting: Internal Medicine

## 2022-11-13 NOTE — Telephone Encounter (Signed)
Pt requesting refill of oxycodone

## 2022-11-16 MED ORDER — OXYCODONE-ACETAMINOPHEN 7.5-325 MG PO TABS: 1.0000 | ORAL_TABLET | Freq: Three times a day (TID) | ORAL | 0 refills | Status: DC | PRN

## 2022-11-22 ENCOUNTER — Encounter: Payer: Self-pay | Admitting: Internal Medicine

## 2022-11-22 ENCOUNTER — Ambulatory Visit (INDEPENDENT_AMBULATORY_CARE_PROVIDER_SITE_OTHER): Payer: Medicare Other | Admitting: Internal Medicine

## 2022-11-22 VITALS — BP 120/60 | HR 75 | Temp 98.2°F | Ht 72.0 in | Wt 241.0 lb

## 2022-11-22 DIAGNOSIS — M79604 Pain in right leg: Secondary | ICD-10-CM

## 2022-11-22 DIAGNOSIS — Z9861 Coronary angioplasty status: Secondary | ICD-10-CM | POA: Diagnosis not present

## 2022-11-22 DIAGNOSIS — M79605 Pain in left leg: Secondary | ICD-10-CM

## 2022-11-22 DIAGNOSIS — F413 Other mixed anxiety disorders: Secondary | ICD-10-CM | POA: Diagnosis not present

## 2022-11-22 DIAGNOSIS — M545 Low back pain, unspecified: Secondary | ICD-10-CM | POA: Diagnosis not present

## 2022-11-22 DIAGNOSIS — I2510 Atherosclerotic heart disease of native coronary artery without angina pectoris: Secondary | ICD-10-CM

## 2022-11-22 DIAGNOSIS — K5904 Chronic idiopathic constipation: Secondary | ICD-10-CM

## 2022-11-22 MED ORDER — OXYCODONE-ACETAMINOPHEN 7.5-325 MG PO TABS: 1.0000 | ORAL_TABLET | Freq: Four times a day (QID) | ORAL | 0 refills | Status: DC | PRN

## 2022-11-22 MED ORDER — SENNOSIDES-DOCUSATE SODIUM 8.6-50 MG PO TABS: 1.0000 | ORAL_TABLET | Freq: Every day | ORAL | 6 refills | Status: DC

## 2022-11-22 MED ORDER — CLONAZEPAM 0.5 MG PO TABS: 0.5000 mg | ORAL_TABLET | Freq: Two times a day (BID) | ORAL | 3 refills | Status: DC | PRN

## 2022-11-22 NOTE — Assessment & Plan Note (Signed)
Cont on Percocet prn Not to take w/Clonazepam  Potential benefits of a long term opioids use as well as potential risks (i.e. addiction risk, apnea etc) and complications (i.e. Somnolence, constipation and others) were explained to the patient and were aknowledged. 

## 2022-11-22 NOTE — Assessment & Plan Note (Signed)
Chronic Start Senokot S 1-2 qhs

## 2022-11-22 NOTE — Progress Notes (Signed)
Subjective:  Patient ID: [name], male    DOB: 1955-08-25  Age: 67 y.o. MRN: [id number]  CC: No chief complaint on file.   HPI [name] presents for LBP, anxiety, CAD C/o constipation  Outpatient Medications Prior to Visit  Medication Sig Dispense Refill   albuterol (PROVENTIL) (2.5 MG/3ML) 0.083% nebulizer solution INHALE 1 VIAL VIA NEBULIZER EVERY DAY AS NEEDED FOR WHEEZING/SHORTNESS OF BREATH 75 mL 5   aspirin EC 81 MG tablet Take 81 mg by mouth every evening.      aspirin-sod bicarb-citric acid (ALKA-SELTZER) 325 MG TBEF tablet Take 650 mg by mouth every 6 (six) hours as needed (indigestion).     clopidogrel (PLAVIX) 75 MG tablet TAKE 1 TABLET BY MOUTH EVERY DAY WITH BREAKFAST 90 tablet 3   fluticasone-salmeterol (ADVAIR HFA) 45-21 MCG/ACT inhaler Inhale 2 puffs into the lungs 2 (two) times daily. 1 each 1   metolazone (ZAROXOLYN) 5 MG tablet Take 1 tablet (5 mg total) by mouth daily as needed (swelling).     metoprolol succinate (TOPROL-XL) 25 MG 24 hr tablet Take 1 tablet (25 mg total) by mouth daily. Take with or immediately following a meal. 90 tablet 3   oxymetazoline (AFRIN) 0.05 % nasal spray Place 1 spray into both nostrils 2 (two) times daily as needed for congestion.     simvastatin (ZOCOR) 20 MG tablet Take 1 tablet (20 mg total) by mouth at bedtime. 90 tablet 3   torsemide (DEMADEX) 100 MG tablet TAKE 1 TABLET BY MOUTH EVERY DAY (Patient taking differently: Take 100 mg by mouth daily as needed (swelling).) 90 tablet 1   clonazePAM (KLONOPIN) 0.5 MG tablet TAKE 1 TABLET BY MOUTH TWICE A DAY AS NEEDED 60 tablet 3   oxyCODONE-acetaminophen (PERCOCET) 7.5-325 MG tablet Take 1 tablet by mouth 3 (three) times daily as needed for severe pain. 120 tablet 0   nitroGLYCERIN (NITROSTAT) 0.4 MG SL tablet Place 1 tablet (0.4 mg total) under the tongue every 5 (five) minutes as needed for chest pain (Up to 3 doses). 25 tablet 1   No facility-administered  medications prior to visit.    ROS: Review of Systems  Constitutional:  Positive for fatigue. Negative for appetite change and unexpected weight change.  HENT:  Negative for congestion, nosebleeds, sneezing, sore throat and trouble swallowing.   Eyes:  Negative for itching and visual disturbance.  Respiratory:  Positive for cough and shortness of breath.   Cardiovascular:  Positive for leg swelling. Negative for chest pain and palpitations.  Gastrointestinal:  Negative for abdominal distention, blood in stool, diarrhea and nausea.  Genitourinary:  Negative for frequency and hematuria.  Musculoskeletal:  Negative for back pain, gait problem, joint swelling and neck pain.  Skin:  Negative for rash.  Neurological:  Negative for dizziness, tremors, speech difficulty and weakness.  Psychiatric/Behavioral:  Negative for agitation, dysphoric mood and sleep disturbance. The patient is not nervous/anxious.     Objective:  BP 120/60 (BP Location: Left Arm, Patient Position: Sitting, Cuff Size: Normal)   Pulse 75   Temp 98.2 F (36.8 C) (Oral)   Ht 6' (1.829 m)   Wt 241 lb (109.3 kg)   SpO2 92%   BMI 32.69 kg/m   BP Readings from Last 3 Encounters:  11/22/22 120/60  09/22/22 128/62  08/23/22 130/62    Wt Readings from Last 3 Encounters:  11/22/22 241 lb (109.3 kg)  09/22/22 243 lb (110.2 kg)  08/23/22 243 lb (110.2 kg)  Physical Exam Constitutional:      General: He is not in acute distress.    Appearance: He is well-developed. He is obese.     Comments: NAD  Eyes:     Conjunctiva/sclera: Conjunctivae normal.     Pupils: Pupils are equal, round, and reactive to light.  Neck:     Thyroid: No thyromegaly.     Vascular: No JVD.  Cardiovascular:     Rate and Rhythm: Normal rate and regular rhythm.     Heart sounds: Normal heart sounds. No murmur heard.    No friction rub. No gallop.  Pulmonary:     Effort: Pulmonary effort is normal. No respiratory distress.     Breath  sounds: Normal breath sounds. No wheezing or rales.  Chest:     Chest wall: No tenderness.  Abdominal:     General: Bowel sounds are normal. There is no distension.     Palpations: Abdomen is soft. There is no mass.     Tenderness: There is no abdominal tenderness. There is no guarding or rebound.  Musculoskeletal:        General: Tenderness present. Normal range of motion.     Cervical back: Normal range of motion.     Right lower leg: Edema present.     Left lower leg: Edema present.  Lymphadenopathy:     Cervical: No cervical adenopathy.  Skin:    General: Skin is warm and dry.     Findings: No rash.  Neurological:     Mental Status: He is alert and oriented to person, place, and time.     Cranial Nerves: No cranial nerve deficit.     Motor: No abnormal muscle tone.     Coordination: Coordination normal.     Gait: Gait normal.     Deep Tendon Reflexes: Reflexes are normal and symmetric.  Psychiatric:        Behavior: Behavior normal.        Thought Content: Thought content normal.        Judgment: Judgment normal.   LS w/pain  Lab Results  Component Value Date   WBC 5.2 07/24/2022   HGB 12.3 (L) 07/24/2022   HCT 38.1 (L) 07/24/2022   PLT 91 (L) 07/24/2022   GLUCOSE 104 (H) 07/24/2022   CHOL 108 01/01/2018   TRIG 98 01/01/2018   HDL 35 (L) 01/01/2018   LDLCALC 53 01/01/2018   ALT 13 05/11/2022   AST 29 05/11/2022   NA 138 07/24/2022   K 3.8 07/24/2022   CL 94 (L) 07/24/2022   CREATININE 0.98 07/24/2022   BUN 14 07/24/2022   CO2 34 (H) 07/24/2022   TSH 0.87 09/26/2021   PSA 0.23 09/15/2011   INR 1.0 11/28/2017   HGBA1C 5.2 09/26/2021   MICROALBUR 0.7 03/29/2016    No results found.  Assessment & Plan:   Problem List Items Addressed This Visit       Cardiovascular and Mediastinum   CAD S/P percutaneous coronary angioplasty    Continue with simvastatin, Plavix and baby aspirin        Other   Anxiety disorder    Cont on Clonazepam prn Not to take  w/pain meds  Potential benefits of a long term benzodiazepines  use as well as potential risks  and complications were explained to the patient and were aknowledged.      Constipation    Chronic Start Senokot S 1-2 qhs      Low back pain radiating  to both legs - Primary    Cont on Percocet prn Not to take w/Clonazepam  Potential benefits of a long term opioids use as well as potential risks (i.e. addiction risk, apnea etc) and complications (i.e. Somnolence, constipation and others) were explained to the patient and were aknowledged.      Relevant Medications   oxyCODONE-acetaminophen (PERCOCET) 7.5-325 MG tablet   oxyCODONE-acetaminophen (PERCOCET) 7.5-325 MG tablet      Meds ordered this encounter  Medications   senna-docusate (SENOKOT-S) 8.6-50 MG tablet    Sig: Take 1-2 tablets by mouth at bedtime.    Dispense:  100 tablet    Refill:  6   oxyCODONE-acetaminophen (PERCOCET) 7.5-325 MG tablet    Sig: Take 1 tablet by mouth every 6 (six) hours as needed for severe pain.    Dispense:  120 tablet    Refill:  0    Please fill on or after 01/15/23   oxyCODONE-acetaminophen (PERCOCET) 7.5-325 MG tablet    Sig: Take 1 tablet by mouth every 6 (six) hours as needed for severe pain.    Dispense:  120 tablet    Refill:  0    Please fill on or after 12/16/22   clonazePAM (KLONOPIN) 0.5 MG tablet    Sig: Take 1 tablet (0.5 mg total) by mouth 2 (two) times daily as needed.    Dispense:  60 tablet    Refill:  3      Follow-up: Return in about 3 months (around 02/21/2023) for a follow-up visit.  Sonda Primes, MD

## 2022-11-22 NOTE — Assessment & Plan Note (Signed)
Continue with simvastatin, Plavix and baby aspirin 

## 2022-11-22 NOTE — Assessment & Plan Note (Signed)
Cont on Clonazepam prn Not to take w/pain meds  Potential benefits of a long term benzodiazepines  use as well as potential risks  and complications were explained to the patient and were aknowledged. 

## 2022-12-27 ENCOUNTER — Other Ambulatory Visit: Payer: Self-pay | Admitting: Internal Medicine

## 2023-01-09 ENCOUNTER — Encounter: Payer: Self-pay | Admitting: Internal Medicine

## 2023-01-19 ENCOUNTER — Other Ambulatory Visit: Payer: Self-pay | Admitting: Internal Medicine

## 2023-02-06 ENCOUNTER — Encounter: Payer: Self-pay | Admitting: Internal Medicine

## 2023-02-06 NOTE — Telephone Encounter (Signed)
Are you ok with this refill? You have not filled this before.

## 2023-02-07 ENCOUNTER — Other Ambulatory Visit: Payer: Self-pay | Admitting: Internal Medicine

## 2023-02-07 MED ORDER — METOLAZONE 5 MG PO TABS: 5.0000 mg | ORAL_TABLET | Freq: Every day | ORAL | 1 refills | Status: DC | PRN

## 2023-02-12 ENCOUNTER — Encounter: Payer: Self-pay | Admitting: Internal Medicine

## 2023-02-12 MED ORDER — SIMVASTATIN 20 MG PO TABS: 20.0000 mg | ORAL_TABLET | Freq: Every day | ORAL | 3 refills | Status: DC

## 2023-02-21 ENCOUNTER — Encounter: Payer: Self-pay | Admitting: Internal Medicine

## 2023-02-21 ENCOUNTER — Ambulatory Visit (INDEPENDENT_AMBULATORY_CARE_PROVIDER_SITE_OTHER): Payer: Medicare Other

## 2023-02-21 ENCOUNTER — Ambulatory Visit (INDEPENDENT_AMBULATORY_CARE_PROVIDER_SITE_OTHER): Payer: Medicare Other | Admitting: Internal Medicine

## 2023-02-21 VITALS — BP 120/62 | HR 70 | Temp 98.0°F | Ht 72.0 in | Wt 241.0 lb

## 2023-02-21 DIAGNOSIS — M25552 Pain in left hip: Secondary | ICD-10-CM

## 2023-02-21 DIAGNOSIS — R609 Edema, unspecified: Secondary | ICD-10-CM

## 2023-02-21 DIAGNOSIS — G8929 Other chronic pain: Secondary | ICD-10-CM

## 2023-02-21 DIAGNOSIS — F5101 Primary insomnia: Secondary | ICD-10-CM

## 2023-02-21 DIAGNOSIS — M545 Low back pain, unspecified: Secondary | ICD-10-CM | POA: Diagnosis not present

## 2023-02-21 DIAGNOSIS — M79604 Pain in right leg: Secondary | ICD-10-CM

## 2023-02-21 DIAGNOSIS — M79605 Pain in left leg: Secondary | ICD-10-CM | POA: Diagnosis not present

## 2023-02-21 DIAGNOSIS — C8228 Follicular lymphoma grade III, unspecified, lymph nodes of multiple sites: Secondary | ICD-10-CM

## 2023-02-21 DIAGNOSIS — M25551 Pain in right hip: Secondary | ICD-10-CM

## 2023-02-21 DIAGNOSIS — E1151 Type 2 diabetes mellitus with diabetic peripheral angiopathy without gangrene: Secondary | ICD-10-CM

## 2023-02-21 DIAGNOSIS — I5032 Chronic diastolic (congestive) heart failure: Secondary | ICD-10-CM

## 2023-02-21 LAB — URINALYSIS
Bilirubin Urine: NEGATIVE
Ketones, ur: NEGATIVE
Leukocytes,Ua: NEGATIVE
Nitrite: NEGATIVE
Specific Gravity, Urine: 1.015 (ref 1.000–1.030)
Urine Glucose: NEGATIVE
Urobilinogen, UA: 1 (ref 0.0–1.0)
pH: 8.5 — AB (ref 5.0–8.0)

## 2023-02-21 LAB — COMPREHENSIVE METABOLIC PANEL
ALT: 10 U/L (ref 0–53)
AST: 20 U/L (ref 0–37)
Albumin: 4.2 g/dL (ref 3.5–5.2)
Alkaline Phosphatase: 112 U/L (ref 39–117)
BUN: 24 mg/dL — ABNORMAL HIGH (ref 6–23)
CO2: 47 mEq/L — ABNORMAL HIGH (ref 19–32)
Calcium: 9.8 mg/dL (ref 8.4–10.5)
Chloride: 84 mEq/L — ABNORMAL LOW (ref 96–112)
Creatinine, Ser: 1.15 mg/dL (ref 0.40–1.50)
GFR: 65.17 mL/min (ref >60.00)
Glucose, Bld: 105 mg/dL — ABNORMAL HIGH (ref 70–99)
Potassium: 4.4 mEq/L (ref 3.5–5.1)
Sodium: 137 mEq/L (ref 135–145)
Total Bilirubin: 0.8 mg/dL (ref 0.2–1.2)
Total Protein: 7.5 g/dL (ref 6.0–8.3)

## 2023-02-21 LAB — CBC WITH DIFFERENTIAL/PLATELET
Basophils Absolute: 0.1 10*3/uL (ref 0.0–0.1)
Basophils Relative: 1.3 % (ref 0.0–3.0)
Eosinophils Absolute: 0.1 10*3/uL (ref 0.0–0.7)
Eosinophils Relative: 2.3 % (ref 0.0–5.0)
HCT: 31.3 % — ABNORMAL LOW (ref 39.0–52.0)
Hemoglobin: 9.9 g/dL — ABNORMAL LOW (ref 13.0–17.0)
Lymphocytes Relative: 10.1 % — ABNORMAL LOW (ref 12.0–46.0)
Lymphs Abs: 0.6 10*3/uL — ABNORMAL LOW (ref 0.7–4.0)
MCHC: 31.7 g/dL (ref 30.0–36.0)
MCV: 94.1 fl (ref 78.0–100.0)
Monocytes Absolute: 1.1 10*3/uL — ABNORMAL HIGH (ref 0.1–1.0)
Monocytes Relative: 18.6 % — ABNORMAL HIGH (ref 3.0–12.0)
Neutro Abs: 4 10*3/uL (ref 1.4–7.7)
Neutrophils Relative %: 67.7 % (ref 43.0–77.0)
Platelets: 140 10*3/uL — ABNORMAL LOW (ref 150.0–400.0)
RBC: 3.33 Mil/uL — ABNORMAL LOW (ref 4.22–5.81)
RDW: 16.6 % — ABNORMAL HIGH (ref 11.5–15.5)
WBC: 5.9 10*3/uL (ref 4.0–10.5)

## 2023-02-21 LAB — LIPID PANEL
Cholesterol: 97 mg/dL (ref 0–200)
HDL: 41.6 mg/dL (ref >39.00)
LDL Cholesterol: 48 mg/dL (ref 0–99)
NonHDL: 55.17
Total CHOL/HDL Ratio: 2
Triglycerides: 37 mg/dL (ref 0.0–149.0)
VLDL: 7.4 mg/dL (ref 0.0–40.0)

## 2023-02-21 LAB — MICROALBUMIN / CREATININE URINE RATIO
Creatinine,U: 49.9 mg/dL
Microalb Creat Ratio: 21.8 mg/g (ref 0.0–30.0)
Microalb, Ur: 10.9 mg/dL — ABNORMAL HIGH (ref 0.0–1.9)

## 2023-02-21 LAB — TSH: TSH: 2.22 u[IU]/mL (ref 0.35–5.50)

## 2023-02-21 LAB — HEMOGLOBIN A1C: Hgb A1c MFr Bld: 5.7 % (ref 4.6–6.5)

## 2023-02-21 LAB — T4, FREE: Free T4: 0.75 ng/dL (ref 0.60–1.60)

## 2023-02-21 MED ORDER — OXYCODONE-ACETAMINOPHEN 7.5-325 MG PO TABS: 1.0000 | ORAL_TABLET | Freq: Four times a day (QID) | ORAL | 0 refills | Status: DC | PRN

## 2023-02-21 MED ORDER — CLONAZEPAM 1 MG PO TABS: 1.0000 mg | ORAL_TABLET | Freq: Every day | ORAL | 5 refills | Status: DC

## 2023-02-21 NOTE — Assessment & Plan Note (Signed)
NAS diet, on O2 l/min [place] - at night On torsemide, hydralazine, Zaroxolyn d/c 

## 2023-02-21 NOTE — Progress Notes (Signed)
Subjective:  Patient ID: [name], male    DOB: 03/22/1956  Age: 67 y.o. MRN: [id number]  CC: Follow-up (3 mnth f/u)   HPI [name] presents for DM2, HTN, CHF, lymphoma C/o insomnia and edema- worse C/o B hip pain - worse  Outpatient Medications Prior to Visit  Medication Sig Dispense Refill   albuterol (PROVENTIL) (2.5 MG/3ML) 0.083% nebulizer solution INHALE 1 VIAL VIA NEBULIZER EVERY DAY AS NEEDED FOR WHEEZING/SHORTNESS OF BREATH 75 mL 5   aspirin EC 81 MG tablet Take 81 mg by mouth every evening.      aspirin-sod bicarb-citric acid (ALKA-SELTZER) 325 MG TBEF tablet Take 650 mg by mouth every 6 (six) hours as needed (indigestion).     clopidogrel (PLAVIX) 75 MG tablet TAKE 1 TABLET BY MOUTH EVERY DAY WITH BREAKFAST 90 tablet 3   fluticasone-salmeterol (ADVAIR HFA) 45-21 MCG/ACT inhaler Inhale 2 puffs into the lungs 2 (two) times daily. 1 each 1   metolazone (ZAROXOLYN) 5 MG tablet Take 1 tablet (5 mg total) by mouth daily as needed (swelling). 90 tablet 1   metoprolol succinate (TOPROL-XL) 25 MG 24 hr tablet Take 1 tablet (25 mg total) by mouth daily. Take with or immediately following a meal. 90 tablet 3   oxymetazoline (AFRIN) 0.05 % nasal spray Place 1 spray into both nostrils 2 (two) times daily as needed for congestion.     senna-docusate (SENOKOT-S) 8.6-50 MG tablet Take 1-2 tablets by mouth at bedtime. 100 tablet 6   simvastatin (ZOCOR) 20 MG tablet Take 1 tablet (20 mg total) by mouth at bedtime. 90 tablet 3   torsemide (DEMADEX) 100 MG tablet TAKE 1 TABLET BY MOUTH EVERY DAY 90 tablet 1   clonazePAM (KLONOPIN) 0.5 MG tablet Take 1 tablet (0.5 mg total) by mouth 2 (two) times daily as needed. 60 tablet 3   oxyCODONE-acetaminophen (PERCOCET) 7.5-325 MG tablet Take 1 tablet by mouth every 6 (six) hours as needed for severe pain. 120 tablet 0   oxyCODONE-acetaminophen (PERCOCET) 7.5-325 MG tablet Take 1 tablet by mouth every 6 (six) hours as needed for severe  pain. 120 tablet 0   nitroGLYCERIN (NITROSTAT) 0.4 MG SL tablet Place 1 tablet (0.4 mg total) under the tongue every 5 (five) minutes as needed for chest pain (Up to 3 doses). 25 tablet 1   No facility-administered medications prior to visit.    ROS: Review of Systems  Constitutional:  Positive for fatigue. Negative for appetite change and unexpected weight change.  HENT:  Negative for congestion, nosebleeds, sneezing, sore throat and trouble swallowing.   Eyes:  Negative for itching and visual disturbance.  Respiratory:  Positive for shortness of breath. Negative for cough.   Cardiovascular:  Positive for leg swelling. Negative for chest pain and palpitations.  Gastrointestinal:  Negative for abdominal distention, blood in stool, diarrhea and nausea.  Genitourinary:  Negative for frequency and hematuria.  Musculoskeletal:  Positive for arthralgias, back pain and gait problem. Negative for joint swelling and neck pain.  Skin:  Positive for color change. Negative for rash and wound.  Neurological:  Negative for dizziness, tremors, speech difficulty and weakness.  Hematological:  Bruises/bleeds easily.  Psychiatric/Behavioral:  Negative for agitation, dysphoric mood and suicidal ideas. The patient is not nervous/anxious.     Objective:  BP 120/62 (BP Location: Right Arm, Patient Position: Sitting, Cuff Size: Large)   Pulse 70   Temp 98 F (36.7 C) (Oral)   Ht 6' (1.829 m)   [employer]  241 lb (109.3 kg)   SpO2 90%   BMI 32.69 kg/m   BP Readings from Last 3 Encounters:  02/21/23 120/62  11/22/22 120/60  09/22/22 128/62    Wt Readings from Last 3 Encounters:  02/21/23 241 lb (109.3 kg)  11/22/22 241 lb (109.3 kg)  09/22/22 243 lb (110.2 kg)    Physical Exam Constitutional:      General: He is not in acute distress.    Appearance: He is well-developed. He is obese.     Comments: NAD  HENT:     Nose: Congestion present.  Eyes:     Conjunctiva/sclera: Conjunctivae normal.      Pupils: Pupils are equal, round, and reactive to light.  Neck:     Thyroid: No thyromegaly.     Vascular: No JVD.  Cardiovascular:     Rate and Rhythm: Normal rate and regular rhythm.     Heart sounds: Normal heart sounds. No murmur heard.    No friction rub. No gallop.  Pulmonary:     Effort: Pulmonary effort is normal. No respiratory distress.     Breath sounds: Rhonchi present. No wheezing or rales.  Chest:     Chest wall: No tenderness.  Abdominal:     General: Bowel sounds are normal. There is no distension.     Palpations: Abdomen is soft. There is no mass.     Tenderness: There is no abdominal tenderness. There is no guarding or rebound.  Musculoskeletal:        General: Tenderness present. Normal range of motion.     Cervical back: Normal range of motion.     Right lower leg: Edema present.     Left lower leg: Edema present.  Lymphadenopathy:     Cervical: No cervical adenopathy.  Skin:    General: Skin is warm and dry.     Findings: No rash.  Neurological:     Mental Status: He is alert and oriented to person, place, and time.     Cranial Nerves: No cranial nerve deficit.     Motor: No abnormal muscle tone.     Coordination: Coordination abnormal.     Gait: Gait abnormal.     Deep Tendon Reflexes: Reflexes are normal and symmetric.  Psychiatric:        Behavior: Behavior normal.        Thought Content: Thought content normal.        Judgment: Judgment normal.   Antalgic gait Indurated discolored B lower legs w/2+ edema  Lab Results  Component Value Date   WBC 5.2 07/24/2022   HGB 12.3 (L) 07/24/2022   HCT 38.1 (L) 07/24/2022   PLT 91 (L) 07/24/2022   GLUCOSE 104 (H) 07/24/2022   CHOL 108 01/01/2018   TRIG 98 01/01/2018   HDL 35 (L) 01/01/2018   LDLCALC 53 01/01/2018   ALT 13 05/11/2022   AST 29 05/11/2022   NA 138 07/24/2022   K 3.8 07/24/2022   CL 94 (L) 07/24/2022   CREATININE 0.98 07/24/2022   BUN 14 07/24/2022   CO2 34 (H) 07/24/2022   TSH  0.87 09/26/2021   PSA 0.23 09/15/2011   INR 1.0 11/28/2017   HGBA1C 5.2 09/26/2021   MICROALBUR 0.7 03/29/2016    No results found.  Assessment & Plan:   Problem List Items Addressed This Visit     Edema    Indurated discolored B lower legs w/2+ edema. Worse Take Demadex and Metolazone prn  Relevant Orders   CBC with Differential/Platelet   T4, free   TSH   Urinalysis   Insomnia    Will increase Clonazepam dose, lower dose is ineffective now.  Potential benefits of a long term benzodiazepines  use as well as potential risks  and complications were explained to the patient and were aknowledged.       Diabetes mellitus type 2, controlled (HCC) - Primary (Chronic)   Relevant Orders   Lipid panel   Microalbumin / creatinine urine ratio   Hemoglobin A1c   T4, free   TSH   Urinalysis   Follicular lymphoma (HCC)    10/23: Per Dr Bertis Ruddy  - He has nonspecific enlarged lymph node in the mediastinum No other significant lymphadenopathy that could explain his profound weight loss Due to his history of heavy smoking, I recommend referral back to pulmonologist for evaluation and possible biopsy From the lymphoma standpoint, he have no signs of cancer recurrence  Check labs      Relevant Medications   clonazePAM (KLONOPIN) 1 MG tablet   oxyCODONE-acetaminophen (PERCOCET) 7.5-325 MG tablet   oxyCODONE-acetaminophen (PERCOCET) 7.5-325 MG tablet   Other Relevant Orders   DG Pelvis 1-2 Views   Chronic diastolic CHF (congestive heart failure) (HCC) (Chronic)    NAS diet, on O2 l/min [place] - at night On torsemide, hydralazine, Zaroxolyn d/c      Relevant Orders   CBC with Differential/Platelet   Comprehensive metabolic panel   T4, free   TSH   Urinalysis   Low back pain radiating to both legs    Cont on Percocet prn Not to take w/Clonazepam  Potential benefits of a long term opioids use as well as potential risks (i.e. addiction risk, apnea etc) and complications (i.e.  Somnolence, constipation and others) were explained to the patient and were aknowledged.      Relevant Medications   oxyCODONE-acetaminophen (PERCOCET) 7.5-325 MG tablet   oxyCODONE-acetaminophen (PERCOCET) 7.5-325 MG tablet   Other Relevant Orders   DG Pelvis 1-2 Views   Hip pain, chronic    Worse B hips - probable B OA and a spinal stenosis X ray hips      Relevant Medications   clonazePAM (KLONOPIN) 1 MG tablet   oxyCODONE-acetaminophen (PERCOCET) 7.5-325 MG tablet   oxyCODONE-acetaminophen (PERCOCET) 7.5-325 MG tablet   Other Relevant Orders   DG Pelvis 1-2 Views      Meds ordered this encounter  Medications   clonazePAM (KLONOPIN) 1 MG tablet    Sig: Take 1 tablet (1 mg total) by mouth at bedtime.    Dispense:  30 tablet    Refill:  5   oxyCODONE-acetaminophen (PERCOCET) 7.5-325 MG tablet    Sig: Take 1 tablet by mouth every 6 (six) hours as needed for severe pain.    Dispense:  120 tablet    Refill:  0    Please fill on or after 04/22/23   oxyCODONE-acetaminophen (PERCOCET) 7.5-325 MG tablet    Sig: Take 1 tablet by mouth every 6 (six) hours as needed for severe pain.    Dispense:  120 tablet    Refill:  0    Please fill on or after 03/23/23      Follow-up: No follow-ups on file.  Sonda Primes, MD

## 2023-02-21 NOTE — Assessment & Plan Note (Signed)
Cont on Percocet prn Not to take w/Clonazepam  Potential benefits of a long term opioids use as well as potential risks (i.e. addiction risk, apnea etc) and complications (i.e. Somnolence, constipation and others) were explained to the patient and were aknowledged. 

## 2023-02-21 NOTE — Assessment & Plan Note (Signed)
Worse B hips - probable B OA and a spinal stenosis X ray hips

## 2023-02-21 NOTE — Assessment & Plan Note (Signed)
Will increase Clonazepam dose, lower dose is ineffective now.  Potential benefits of a long term benzodiazepines  use as well as potential risks  and complications were explained to the patient and were aknowledged.

## 2023-02-21 NOTE — Assessment & Plan Note (Signed)
10/23: Per Dr Bertis Ruddy  - He has nonspecific enlarged lymph node in the mediastinum No other significant lymphadenopathy that could explain his profound weight loss Due to his history of heavy smoking, I recommend referral back to pulmonologist for evaluation and possible biopsy From the lymphoma standpoint, he have no signs of cancer recurrence  Check labs

## 2023-02-21 NOTE — Assessment & Plan Note (Signed)
Indurated discolored B lower legs w/2+ edema. Worse Take Demadex and Metolazone prn

## 2023-02-27 ENCOUNTER — Ambulatory Visit: Payer: Self-pay | Admitting: Licensed Clinical Social Worker

## 2023-02-27 ENCOUNTER — Other Ambulatory Visit: Payer: Self-pay | Admitting: Hematology and Oncology

## 2023-02-27 NOTE — Patient Outreach (Signed)
  Care Coordination   Initial Visit Note   02/27/2023 Name: [name] MRN: [id number] DOB: [birth year]  [name] is a 67 y.o. year old male who sees Plotnikov, Georgina Quint, MD for primary care. I spoke with  [name] by phone today.  What matters to the patients health and wellness today? Client uses a cane to help him walk. He is at risk for falls.     Goals Addressed             This Visit's Progress    Patient Stated he uses a cane to help him walk. He is at risk for falls       Interventions:  Spoke with client about client needs Discussed program support with RN, LCSW, Pharmacist Discussed family support. He has some support from his wife Discussed transport needs. He drives himself to appointments as able. Discussed appetite. Discussed sleeping issues Discussed swelling in legs. He spoke of taking a diuretic. He tries to prop up his feet when he is able to do so. Spoke of client support with PCP, Dr. Posey Rea Client spoke of recent blood test Client spoke of history of cancer treatments Spoke of nursing needs of client. He said he did not feel as if he needed nurse to call at this time Described ways program perhaps could help client Encouraged client to call LCSW as needed for SW support at [phone]        SDOH assessments and interventions completed:  Yes  SDOH Interventions Today    Flowsheet Row Most Recent Value  SDOH Interventions   Physical Activity Interventions Other (Comments)  [uses a cane to help him walk. Talked of swelling in his legs]  Stress Interventions Provide Counseling  [stress in managing medical needs]        Care Coordination Interventions:  Yes, provided    Interventions Today    Flowsheet Row Most Recent Value  Chronic Disease   Chronic disease during today's visit Other  [spoke with client about client needs]  General Interventions   General Interventions Discussed/Reviewed General  Interventions Discussed, Community Resources  [discussed program support]  Exercise Interventions   Exercise Discussed/Reviewed Physical Activity  [uses a cane sometimes to help him walk]  Physical Activity Discussed/Reviewed Physical Activity Discussed  Mental Health Interventions   Mental Health Discussed/Reviewed Coping Strategies  [client has stress related to managing medical needs]  Nutrition Interventions   Nutrition Discussed/Reviewed Nutrition Discussed  Pharmacy Interventions   Pharmacy Dicussed/Reviewed Pharmacy Topics Discussed  Safety Interventions   Safety Discussed/Reviewed Fall Risk       Follow up plan: Follow up call scheduled for 04/18/23 at 10:00 AM     Encounter Outcome:  Pt. Visit Completed   Kelton Pillar.Franck Vinal MSW, LCSW Licensed Visual merchandiser [facility] Care Management [phone]

## 2023-02-27 NOTE — Patient Instructions (Signed)
Visit Information  Thank you for taking time to visit with me today. Please don't hesitate to contact me if I can be of assistance to you.   Following are the goals we discussed today:   Goals Addressed             This Visit's Progress    Patient Stated he uses a cane to help him walk. He is at risk for falls       Interventions:  Spoke with client about client needs Discussed program support with RN, LCSW, Pharmacist Discussed family support. He has some support from his wife Discussed transport needs. He drives himself to appointments as able. Discussed appetite. Discussed sleeping issues Discussed swelling in legs. He spoke of taking a diuretic. He tries to prop up his feet when he is able to do so. Spoke of client support with PCP, Dr. Posey Rea Client spoke of recent blood test Client spoke of history of cancer treatments Spoke of nursing needs of client. He said he did not feel as if he needed nurse to call at this time Described ways program perhaps could help client Encouraged client to call LCSW as needed for SW support at [phone]        Our next appointment is by telephone on 04/18/23 at 10:00 AM   Please call the care guide team at [phone] if you need to cancel or reschedule your appointment.   If you are experiencing a Mental Health or Behavioral Health Crisis or need someone to talk to, please go to [facility] [address] [place] [phone])   The patient verbalized understanding of instructions, educational materials, and care plan provided today and DECLINED offer to receive copy of patient instructions, educational materials, and care plan.   The patient has been provided with contact information for the care management team and has been advised to call with any health related questions or concerns.   Kelton Pillar.Kemar Pandit MSW, LCSW Licensed Visual merchandiser [facility] Care Management [phone]

## 2023-03-05 ENCOUNTER — Ambulatory Visit (INDEPENDENT_AMBULATORY_CARE_PROVIDER_SITE_OTHER): Payer: Medicare Other

## 2023-03-05 VITALS — Ht 72.0 in | Wt 241.0 lb

## 2023-03-05 DIAGNOSIS — Z0000 Encounter for general adult medical examination without abnormal findings: Secondary | ICD-10-CM

## 2023-03-05 NOTE — Patient Instructions (Signed)
[name] , Thank you for taking time to come for your Medicare Wellness Visit. I appreciate your ongoing commitment to your health goals. Please review the following plan we discussed and let me know if I can assist you in the future.   These are the goals we discussed:  Goals       Patient Stated (pt-stated)      My goal for this year is to quit smoking.      Patient Stated he uses a cane to help him walk. He is at risk for falls      Interventions:  Spoke with client about client needs Discussed program support with RN, LCSW, Pharmacist Discussed family support. He has some support from his wife Discussed transport needs. He drives himself to appointments as able. Discussed appetite. Discussed sleeping issues Discussed swelling in legs. He spoke of taking a diuretic. He tries to prop up his feet when he is able to do so. Spoke of client support with PCP, Dr. Posey Rea Client spoke of recent blood test Client spoke of history of cancer treatments Spoke of nursing needs of client. He said he did not feel as if he needed nurse to call at this time Described ways program perhaps could help client Encouraged client to call LCSW as needed for SW support at [phone]        This is a list of the screening recommended for you and due dates:  Health Maintenance  Topic Date Due   Eye exam for diabetics  Never done   Complete foot exam   03/29/2017   COVID-19 Vaccine (3 - Pfizer risk series) 12/11/2019   Flu Shot  03/15/2023   Screening for Lung Cancer  05/16/2023   Hemoglobin A1C  08/24/2023   Yearly kidney function blood test for diabetes  02/21/2024   Yearly kidney health urinalysis for diabetes  02/21/2024   Medicare Annual Wellness Visit  03/04/2024   DTaP/Tdap/Td vaccine (2 - Td or Tdap) 03/29/2025   Colon Cancer Screening  08/22/2032   Pneumonia Vaccine  Completed   Hepatitis C Screening  Completed   HPV Vaccine  Aged Out   Zoster (Shingles) Vaccine  Discontinued     Advanced directives: Advance directive discussed with you today. Even though you declined this today, please call our office should you change your mind, and we can give you the proper paperwork for you to fill out.   Conditions/risks identified: Each day, aim for 6 glasses of water, plenty of protein in your diet and try to get up and walk/ stretch every hour for 5-10 minutes at a time.    Next appointment: Follow up in one year for your annual wellness visit.   Preventive Care 86 Years and Older, Male  Preventive care refers to lifestyle choices and visits with your health care provider that can promote health and wellness. What does preventive care include? A yearly physical exam. This is also called an annual well check. Dental exams once or twice a year. Routine eye exams. Ask your health care provider how often you should have your eyes checked. Personal lifestyle choices, including: Daily care of your teeth and gums. Regular physical activity. Eating a healthy diet. Avoiding tobacco and drug use. Limiting alcohol use. Practicing safe sex. Taking low doses of aspirin every day. Taking vitamin and mineral supplements as recommended by your health care provider. What happens during an annual well check? The services and screenings done by your health care provider during your  annual well check will depend on your age, overall health, lifestyle risk factors, and family history of disease. Counseling  Your health care provider may ask you questions about your: Alcohol use. Tobacco use. Drug use. Emotional well-being. Home and relationship well-being. Sexual activity. Eating habits. History of falls. Memory and ability to understand (cognition). Work and work Astronomer. Screening  You may have the following tests or measurements: Height, weight, and BMI. Blood pressure. Lipid and cholesterol levels. These may be checked every 5 years, or more frequently if you are  over 29 years old. Skin check. Lung cancer screening. You may have this screening every year starting at age 39 if you have a 30-pack-year history of smoking and currently smoke or have quit within the past 15 years. Fecal occult blood test (FOBT) of the stool. You may have this test every year starting at age 45. Flexible sigmoidoscopy or colonoscopy. You may have a sigmoidoscopy every 5 years or a colonoscopy every 10 years starting at age 21. Prostate cancer screening. Recommendations will vary depending on your family history and other risks. Hepatitis C blood test. Hepatitis B blood test. Sexually transmitted disease (STD) testing. Diabetes screening. This is done by checking your blood sugar (glucose) after you have not eaten for a while (fasting). You may have this done every 1-3 years. Abdominal aortic aneurysm (AAA) screening. You may need this if you are a current or former smoker. Osteoporosis. You may be screened starting at age 74 if you are at high risk. Talk with your health care provider about your test results, treatment options, and if necessary, the need for more tests. Vaccines  Your health care provider may recommend certain vaccines, such as: Influenza vaccine. This is recommended every year. Tetanus, diphtheria, and acellular pertussis (Tdap, Td) vaccine. You may need a Td booster every 10 years. Zoster vaccine. You may need this after age 72. Pneumococcal 13-valent conjugate (PCV13) vaccine. One dose is recommended after age 78. Pneumococcal polysaccharide (PPSV23) vaccine. One dose is recommended after age 20. Talk to your health care provider about which screenings and vaccines you need and how often you need them. This information is not intended to replace advice given to you by your health care provider. Make sure you discuss any questions you have with your health care provider. Document Released: 08/27/2015 Document Revised: 04/19/2016 Document Reviewed:  06/01/2015 Elsevier Interactive Patient Education  2017 [employer].  Fall Prevention in the Home Falls can cause injuries. They can happen to people of all ages. There are many things you can do to make your home safe and to help prevent falls. What can I do on the outside of my home? Regularly fix the edges of walkways and driveways and fix any cracks. Remove anything that might make you trip as you walk through a door, such as a raised step or threshold. Trim any bushes or trees on the path to your home. Use bright outdoor lighting. Clear any walking paths of anything that might make someone trip, such as rocks or tools. Regularly check to see if handrails are loose or broken. Make sure that both sides of any steps have handrails. Any raised decks and porches should have guardrails on the edges. Have any leaves, snow, or ice cleared regularly. Use sand or salt on walking paths during winter. Clean up any spills in your garage right away. This includes oil or grease spills. What can I do in the bathroom? Use night lights. Install grab bars by the  toilet and in the tub and shower. Do not use towel bars as grab bars. Use non-skid mats or decals in the tub or shower. If you need to sit down in the shower, use a plastic, non-slip stool. Keep the floor dry. Clean up any water that spills on the floor as soon as it happens. Remove soap buildup in the tub or shower regularly. Attach bath mats securely with double-sided non-slip rug tape. Do not have throw rugs and other things on the floor that can make you trip. What can I do in the bedroom? Use night lights. Make sure that you have a light by your bed that is easy to reach. Do not use any sheets or blankets that are too big for your bed. They should not hang down onto the floor. Have a firm chair that has side arms. You can use this for support while you get dressed. Do not have throw rugs and other things on the floor that can make you  trip. What can I do in the kitchen? Clean up any spills right away. Avoid walking on wet floors. Keep items that you use a lot in easy-to-reach places. If you need to reach something above you, use a strong step stool that has a grab bar. Keep electrical cords out of the way. Do not use floor polish or wax that makes floors slippery. If you must use wax, use non-skid floor wax. Do not have throw rugs and other things on the floor that can make you trip. What can I do with my stairs? Do not leave any items on the stairs. Make sure that there are handrails on both sides of the stairs and use them. Fix handrails that are broken or loose. Make sure that handrails are as long as the stairways. Check any carpeting to make sure that it is firmly attached to the stairs. Fix any carpet that is loose or worn. Avoid having throw rugs at the top or bottom of the stairs. If you do have throw rugs, attach them to the floor with carpet tape. Make sure that you have a light switch at the top of the stairs and the bottom of the stairs. If you do not have them, ask someone to add them for you. What else can I do to help prevent falls? Wear shoes that: Do not have high heels. Have rubber bottoms. Are comfortable and fit you well. Are closed at the toe. Do not wear sandals. If you use a stepladder: Make sure that it is fully opened. Do not climb a closed stepladder. Make sure that both sides of the stepladder are locked into place. Ask someone to hold it for you, if possible. Clearly mark and make sure that you can see: Any grab bars or handrails. First and last steps. Where the edge of each step is. Use tools that help you move around (mobility aids) if they are needed. These include: Canes. Walkers. Scooters. Crutches. Turn on the lights when you go into a dark area. Replace any light bulbs as soon as they burn out. Set up your furniture so you have a clear path. Avoid moving your furniture  around. If any of your floors are uneven, fix them. If there are any pets around you, be aware of where they are. Review your medicines with your doctor. Some medicines can make you feel dizzy. This can increase your chance of falling. Ask your doctor what other things that you can do to help prevent  falls. This information is not intended to replace advice given to you by your health care provider. Make sure you discuss any questions you have with your health care provider. Document Released: 05/27/2009 Document Revised: 01/06/2016 Document Reviewed: 09/04/2014 Elsevier Interactive Patient Education  2017 [employer] Managing Pain Without Opioids Opioids are strong medicines used to treat moderate to severe pain. For some people, especially those who have long-term (chronic) pain, opioids may not be the best choice for pain management due to: Side effects like nausea, constipation, and sleepiness. The risk of addiction (opioid use disorder). The longer you take opioids, the greater your risk of addiction. Pain that lasts for more than 3 months is called chronic pain. Managing chronic pain usually requires more than one approach and is often provided by a team of health care providers working together (multidisciplinary approach). Pain management may be done at a pain management center or pain clinic. How to manage pain without the use of opioids Use non-opioid medicines Non-opioid medicines for pain may include: Over-the-counter or prescription non-steroidal anti-inflammatory drugs (NSAIDs). These may be the first medicines used for pain. They work well for muscle and bone pain, and they reduce swelling. Acetaminophen. This over-the-counter medicine may work well for milder pain but not swelling. Antidepressants. These may be used to treat chronic pain. A certain type of antidepressant (tricyclics) is often used. These medicines are given in lower doses for pain than when used for  depression. Anticonvulsants. These are usually used to treat seizures but may also reduce nerve (neuropathic) pain. Muscle relaxants. These relieve pain caused by sudden muscle tightening (spasms). You may also use a pain medicine that is applied to the skin as a patch, cream, or gel (topical analgesic), such as a numbing medicine. These may cause fewer side effects than medicines taken by mouth. Do certain therapies as directed Some therapies can help with pain management. They include: Physical therapy. You will do exercises to gain strength and flexibility. A physical therapist may teach you exercises to move and stretch parts of your body that are weak, stiff, or painful. You can learn these exercises at physical therapy visits and practice them at home. Physical therapy may also involve: Massage. Heat wraps or applying heat or cold to affected areas. Electrical signals that interrupt pain signals (transcutaneous electrical nerve stimulation, TENS). Weak lasers that reduce pain and swelling (low-level laser therapy). Signals from your body that help you learn to regulate pain (biofeedback). Occupational therapy. This helps you to learn ways to function at home and work with less pain. Recreational therapy. This involves trying new activities or hobbies, such as a physical activity or drawing. Mental health therapy, including: Cognitive behavioral therapy (CBT). This helps you learn coping skills for dealing with pain. Acceptance and commitment therapy (ACT) to change the way you think and react to pain. Relaxation therapies, including muscle relaxation exercises and mindfulness-based stress reduction. Pain management counseling. This may be individual, family, or group counseling.  Receive medical treatments Medical treatments for pain management include: Nerve block injections. These may include a pain blocker and anti-inflammatory medicines. You may have injections: Near the spine to  relieve chronic back or neck pain. Into joints to relieve back or joint pain. Into nerve areas that supply a painful area to relieve body pain. Into muscles (trigger point injections) to relieve some painful muscle conditions. A medical device placed near your spine to help block pain signals and relieve nerve pain or chronic back pain (spinal cord stimulation  device). Acupuncture. Follow these instructions at home Medicines Take over-the-counter and prescription medicines only as told by your health care provider. If you are taking pain medicine, ask your health care providers about possible side effects to watch out for. Do not drive or use heavy machinery while taking prescription opioid pain medicine. Lifestyle  Do not use drugs or alcohol to reduce pain. If you drink alcohol, limit how much you have to: 0-1 drink a day for women who are not pregnant. 0-2 drinks a day for men. Know how much alcohol is in a drink. In the [place] one drink equals one 12 oz bottle of beer (355 mL), one 5 oz glass of wine (148 mL), or one 1 oz glass of hard liquor (44 mL). Do not use any products that contain nicotine or tobacco. These products include cigarettes, chewing tobacco, and vaping devices, such as e-cigarettes. If you need help quitting, ask your health care provider. Eat a healthy diet and maintain a healthy weight. Poor diet and excess weight may make pain worse. Eat foods that are high in fiber. These include fresh fruits and vegetables, whole grains, and beans. Limit foods that are high in fat and processed sugars, such as fried and sweet foods. Exercise regularly. Exercise lowers stress and may help relieve pain. Ask your health care provider what activities and exercises are safe for you. If your health care provider approves, join an exercise class that combines movement and stress reduction. Examples include yoga and tai chi. Get enough sleep. Lack of sleep may make pain worse. Lower stress  as much as possible. Practice stress reduction techniques as told by your therapist. General instructions Work with all your pain management providers to find the treatments that work best for you. You are an important member of your pain management team. There are many things you can do to reduce pain on your own. Consider joining an online or in-person support group for people who have chronic pain. Keep all follow-up visits. This is important. Where to find more information You can find more information about managing pain without opioids from: American Academy of Pain Medicine: painmed.org Institute for Chronic Pain: instituteforchronicpain.org American Chronic Pain Association: theacpa.org Contact a health care provider if: You have side effects from pain medicine. Your pain gets worse or does not get better with treatments or home therapy. You are struggling with anxiety or depression. Summary Many types of pain can be managed without opioids. Chronic pain may respond better to pain management without opioids. Pain is best managed when you and a team of health care providers work together. Pain management without opioids may include non-opioid medicines, medical treatments, physical therapy, mental health therapy, and lifestyle changes. Tell your health care providers if your pain gets worse or is not being managed well enough. This information is not intended to replace advice given to you by your health care provider. Make sure you discuss any questions you have with your health care provider. Document Revised: 11/10/2020 Document Reviewed: 11/10/2020 Elsevier Patient Education  2024 [employer].

## 2023-03-05 NOTE — Progress Notes (Addendum)
Subjective:   [name] is a 67 y.o. male who presents for Medicare Annual/Subsequent preventive examination.  Visit Complete: Virtual  I connected with  [name] on 03/05/23 by a audio enabled telemedicine application and verified that I am speaking with the correct person using two identifiers.  Patient Location: Home  Provider Location: Office/Clinic  I discussed the limitations of evaluation and management by telemedicine. The patient expressed understanding and agreed to proceed.  Patient Medicare AWV questionnaire was completed by the patient on 02/27/2023; I have confirmed that all information answered by patient is correct and no changes since this date.  Review of Systems    Cardiac Risk Factors include: advanced age (>41men, >16 women);diabetes mellitus;hypertension;obesity (BMI >30kg/m2);male gender;Other (see comment), Risk factor comments: CHF, CAD, COPD     Objective:    Today's Vitals   03/05/23 0813  Weight: 241 lb (109.3 kg)  Height: 6' (1.829 m)   Body mass index is 32.69 kg/m.     03/05/2023    8:28 AM 08/22/2022   10:49 AM 07/24/2022   11:06 AM 03/03/2022   10:30 AM 03/16/2020   10:03 AM 12/07/2017    7:47 AM 11/15/2017    7:27 PM  Advanced Directives  Does Patient Have a Medical Advance Directive? No No No Yes No No No  Type of Advance Directive    Living will;Healthcare Power of Attorney     Does patient want to make changes to medical advance directive?    No - Patient declined     Copy of Healthcare Power of Attorney in Chart?    No - copy requested     Would patient like information on creating a medical advance directive? No - Patient declined No - Patient declined No - Patient declined  No - Patient declined No - Patient declined No - Patient declined    Current Medications (verified) Outpatient Encounter Medications as of 03/05/2023  Medication Sig   albuterol (PROVENTIL) (2.5 MG/3ML) 0.083% nebulizer solution INHALE 1 VIAL VIA  NEBULIZER EVERY DAY AS NEEDED FOR WHEEZING/SHORTNESS OF BREATH   aspirin EC 81 MG tablet Take 81 mg by mouth every evening.    clonazePAM (KLONOPIN) 1 MG tablet Take 1 tablet (1 mg total) by mouth at bedtime.   clopidogrel (PLAVIX) 75 MG tablet TAKE 1 TABLET BY MOUTH EVERY DAY WITH BREAKFAST   metolazone (ZAROXOLYN) 5 MG tablet Take 1 tablet (5 mg total) by mouth daily as needed (swelling).   metoprolol succinate (TOPROL-XL) 25 MG 24 hr tablet Take 1 tablet (25 mg total) by mouth daily. Take with or immediately following a meal.   oxyCODONE-acetaminophen (PERCOCET) 7.5-325 MG tablet Take 1 tablet by mouth every 6 (six) hours as needed for severe pain.   oxymetazoline (AFRIN) 0.05 % nasal spray Place 1 spray into both nostrils 2 (two) times daily as needed for congestion.   simvastatin (ZOCOR) 20 MG tablet Take 1 tablet (20 mg total) by mouth at bedtime.   torsemide (DEMADEX) 100 MG tablet TAKE 1 TABLET BY MOUTH EVERY DAY   aspirin-sod bicarb-citric acid (ALKA-SELTZER) 325 MG TBEF tablet Take 650 mg by mouth every 6 (six) hours as needed (indigestion). (Patient not taking: Reported on 03/05/2023)   fluticasone-salmeterol (ADVAIR HFA) 45-21 MCG/ACT inhaler Inhale 2 puffs into the lungs 2 (two) times daily. (Patient not taking: Reported on 03/05/2023)   nitroGLYCERIN (NITROSTAT) 0.4 MG SL tablet Place 1 tablet (0.4 mg total) under the tongue every 5 (five) minutes as  needed for chest pain (Up to 3 doses).   senna-docusate (SENOKOT-S) 8.6-50 MG tablet Take 1-2 tablets by mouth at bedtime. (Patient not taking: Reported on 03/05/2023)   [DISCONTINUED] oxyCODONE-acetaminophen (PERCOCET) 7.5-325 MG tablet Take 1 tablet by mouth every 6 (six) hours as needed for severe pain.   No facility-administered encounter medications on file as of 03/05/2023.    Allergies (verified) Bendamustine hcl, Heparin, Bendamustine hcl, and Tape   History: Past Medical History:  Diagnosis Date   Anginal pain (HCC)    not  had to use in awhile  none in 10 years   Anxiety    Basal cell carcinoma of nose    Blood dyscrasia    trouble clotting    Carotid artery disease (HCC)    s/p L CEA 2009 (hx of evacuation of hematoma due to heparin)   CHF (congestive heart failure) (HCC)    Chronic lower back pain    COPD (chronic obstructive pulmonary disease) (HCC)    Coronary artery disease    CABG 2005. s/p PTCA and stenting of the saphenous vein graft to PDA and saphenous vein graft to obtuse marginal by Dr. Excell Seltzer 09/29/11. 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   Myocardial infarction (HCC)    Obesity    Shortness of breath dyspnea    with exertion    Sleep apnea    "used to"      could not use 2006   Past Surgical History:  Procedure Laterality Date   BASAL CELL CARCINOMA EXCISION  2000's   nose   BIOPSY  08/22/2022   Procedure: BIOPSY;  Surgeon: Jeani Hawking, MD;  Location: [facility];  Service: Gastroenterology;;   BRONCHIAL NEEDLE ASPIRATION BIOPSY  07/24/2022   Procedure: BRONCHIAL NEEDLE ASPIRATION BIOPSIES;  Surgeon: Leslye Peer, MD;  Location: [facility] ENDOSCOPY;  Service: Cardiopulmonary;;   CARDIAC CATHETERIZATION  12/07/2017   CAROTID ENDARTERECTOMY  03/2008   left   COLONOSCOPY WITH PROPOFOL N/A 08/22/2022   Procedure: COLONOSCOPY WITH PROPOFOL;  Surgeon: Jeani Hawking, MD;  Location: [facility] ENDOSCOPY;  Service: Gastroenterology;  Laterality: N/A;   CORONARY ANGIOPLASTY WITH STENT PLACEMENT  09/29/11   "2"   CORONARY ARTERY BYPASS GRAFT  2005   CABG X3   CORONARY STENT INTERVENTION  12/07/2017   CORONARY STENT INTERVENTION N/A 12/07/2017   Procedure: CORONARY STENT INTERVENTION;   Surgeon: Kathleene Hazel, MD;  Location: [facility] INVASIVE CV LAB;  Service: Cardiovascular;  Laterality: N/A;   ESOPHAGOGASTRODUODENOSCOPY (EGD) WITH PROPOFOL N/A 08/22/2022   Procedure: ESOPHAGOGASTRODUODENOSCOPY (EGD) WITH PROPOFOL;  Surgeon: Jeani Hawking, MD;  Location: [facility] ENDOS[facility];  Service: Gastroenterology;  Laterality: N/A;   evacuation of hematoma  03/2008   left neck; S/P endarterectomy; "cause heparin clotted it up"   I & D EXTREMITY Left 02/11/2015   Procedure: IRRIGATION AND DEBRIDEMENT LEFT LONG FINGER;  Surgeon: Betha Loa, MD;  Location: [facility];  Service: Orthopedics;  Laterality: Left;  I and D Left Long Finger   IR REMOVAL TUN ACCESS W/ PORT W/O FL MOD SED  05/18/2017  MASS EXCISION Left 12/11/2014   Procedure: EXCISIONAL BIOPSY OF LEFT SUPRA CLAVICULAR NECK MASS;  Surgeon: Osborn Coho, MD;  Location: [facility] OR;  Service: ENT;  Laterality: Left;   PERCUTANEOUS CORONARY STENT INTERVENTION (PCI-S) N/A 09/29/2011   Procedure: PERCUTANEOUS CORONARY STENT INTERVENTION (PCI-S);  Surgeon: Tonny Bollman, MD;  Location: [facility] CATH LAB;  Service: Cardiovascular;  Laterality: N/A;   POLYPECTOMY  08/22/2022   Procedure: POLYPECTOMY;  Surgeon: Jeani Hawking, MD;  Location: [facility];  Service: Gastroenterology;;   Tod [place] PLACEMENT  2016   RIGHT/LEFT HEART CATH AND CORONARY/GRAFT ANGIOGRAPHY N/A 12/07/2017   Procedure: RIGHT/LEFT HEART CATH AND CORONARY/GRAFT ANGIOGRAPHY;  Surgeon: Kathleene Hazel, MD;  Location: [facility];  Service: Cardiovascular;  Laterality: N/A;   SUPERFICIAL LYMPH NODE BIOPSY / EXCISION Left    VIDEO BRONCHOSCOPY WITH ENDOBRONCHIAL ULTRASOUND N/A 07/24/2022   Procedure: VIDEO BRONCHOSCOPY WITH ENDOBRONCHIAL ULTRASOUND;  Surgeon: Leslye Peer, MD;  Location: [facility] ENDOSCOPY;  Service: Cardiopulmonary;  Laterality: N/A;   Family History  Problem Relation Age of Onset   Heart disease Father    Bone cancer Mother    Social History    Socioeconomic History   Marital status: Married    Spouse name: Talbert Forest   Number of children: 1   Years of education: Not on file   Highest education level: Not on file  Occupational History   Occupation: Event organiser    Comment: Retired  Tobacco Use   Smoking status: Former    Current packs/day: 0.00    Average packs/day: 2.0 packs/day for 41.0 years (82.0 ttl pk-yrs)    Types: Cigarettes    Start date: 11/23/1976    Quit date: 11/23/2017    Years since quitting: 5.2   Smokeless tobacco: Former    Types: Chew   Tobacco comments:    consult entered  Vaping Use   Vaping status: Never Used  Substance and Sexual Activity   Alcohol use: No    Alcohol/week: 0.0 standard drinks of alcohol    Comment: 09/29/11 "quit years ago"   Drug use: Not Currently    Types: Marijuana    Comment: as of 07/21/22: occasional pipe last use 07/20/22   Sexual activity: Not Currently  Other Topics Concern   Not on file  Social History Narrative   Not on file   Social Determinants of Health   Financial Resource Strain: Low Risk  (03/05/2023)   Overall Financial Resource Strain (CARDIA)    Difficulty of Paying Living Expenses: Not very hard  Food Insecurity: No Food Insecurity (03/05/2023)   Hunger Vital Sign    Worried About Running Out of Food in the Last Year: Never true    Ran Out of Food in the Last Year: Never true  Transportation Needs: No Transportation Needs (03/05/2023)   PRAPARE - Administrator, Civil Service (Medical): No    Lack of Transportation (Non-Medical): No  Physical Activity: Inactive (03/05/2023)   Exercise Vital Sign    Days of Exercise per Week: 0 days    Minutes of Exercise per Session: 0 min  Stress: Stress Concern Present (03/05/2023)   [employer] of Occupational Health - Occupational Stress Questionnaire    Feeling of Stress : Rather much  Social Connections: Moderately Integrated (03/05/2023)   Social Connection and Isolation Panel [NHANES]     Frequency of Communication with Friends and Family: Once a week    Frequency of Social Gatherings with Friends and Family: Once a week  Attends Religious Services: More than 4 times per year    Active Member of Clubs or Organizations: Yes    Attends Banker Meetings: Never    Marital Status: Married    Tobacco Counseling Counseling given: Not Answered Tobacco comments: consult entered   Clinical Intake:  Pre-visit preparation completed: Yes  Pain : No/denies pain     BMI - recorded: 32.69 Nutritional Status: BMI > 30  Obese Nutritional Risks: Nausea/ vomitting/ diarrhea (Per patient had some diarrhea yesterday) Diabetes: Yes CBG done?: No (per patient-never) Did pt. bring in CBG monitor from home?: No  How often do you need to have someone help you when you read instructions, pamphlets, or other written materials from your doctor or pharmacy?: 1 - Never  Interpreter Needed?: No  Information entered by :: Brandalynn Ofallon, RMA   Activities of Daily Living    03/05/2023    8:20 AM  In your present state of health, do you have any difficulty performing the following activities:  Hearing? 1  Comment per patient sometimes difficult to hear in rt ear.  Vision? 0  Difficulty concentrating or making decisions? 1  Comment per patient, every now and then.  Walking or climbing stairs? 0  Dressing or bathing? 0  Doing errands, shopping? 0  Preparing Food and eating ? N  Using the Toilet? N  In the past six months, have you accidently leaked urine? N  Do you have problems with loss of bowel control? N  Managing your Medications? N  Managing your Finances? N  Housekeeping or managing your Housekeeping? N    Patient Care Team: Plotnikov, Georgina Quint, MD as PCP - General (Internal Medicine) Wendall Stade, MD as PCP - Cardiology (Cardiology) Wendall Stade, MD as Consulting Physician (Cardiology) Artis Delay, MD as Consulting Physician (Hematology and  Oncology) Osborn Coho, MD (Inactive) as Consulting Physician (Otolaryngology) Cindee Salt, MD as Consulting Physician (Orthopedic Surgery) Kalman Shan, MD as Consulting Physician (Pulmonary Disease) Isaiah Blakes, LCSW as [facility] Care Management (Licensed Clinical Social Worker)  Indicate any recent Medical Services you may have received from other than [facility] providers in the past year (date may be approximate).     Assessment:   This is a routine wellness examination for [place].  Hearing/Vision screen Hearing Screening - Comments:: Per patient, sometimes on the rt side, hard to hear Vision Screening - Comments:: Wears eyeglasses  Dietary issues and exercise activities discussed:     Goals Addressed               This Visit's Progress     Patient Stated (pt-stated)   On track     My goal for this year is to quit smoking.      Depression Screen    03/05/2023    8:34 AM 02/21/2023   10:43 AM 11/22/2022   10:07 AM 03/03/2022   10:30 AM 03/03/2022   10:27 AM 03/03/2022   10:26 AM 05/06/2021    9:00 AM  PHQ 2/9 Scores  PHQ - 2 Score 0 0 0 1 0 0 1  PHQ- 9 Score 4          Fall Risk    03/05/2023    8:28 AM 02/21/2023   10:43 AM 11/22/2022   10:07 AM 08/23/2022   10:18 AM 03/20/2022    3:55 PM  Fall Risk   Falls in the past year? 0 0 0 0 0  Comment  continunes to deny falls x last 12 months; uses cane, "walking stick" as needed/ indicated  Number falls in past yr: 0 0 0 0 0  Injury with Fall? 0 0 0 0 0  Comment     N/A- no falls reported  Risk for fall due to : No Fall Risks No Fall Risks No Fall Risks No Fall Risks Medication side effect;Impaired balance/gait  Follow up Falls prevention discussed Falls evaluation completed Falls evaluation completed Falls evaluation completed Falls prevention discussed    MEDICARE RISK AT HOME:  Medicare Risk at Home - 03/05/23 0828     Any stairs in or around the home? Yes    If so, are there any  without handrails? Yes    Home free of loose throw rugs in walkways, pet beds, electrical cords, etc? Yes    Adequate lighting in your home to reduce risk of falls? Yes    Life alert? No    Use of a cane, walker or w/c? Yes   per patient-walking stick sometimes   Grab bars in the bathroom? Yes    Shower chair or bench in shower? No    Elevated toilet seat or a handicapped toilet? No             TIMED UP AND GO:  Was the test performed?  No    Cognitive Function:        03/05/2023    8:30 AM 03/03/2022   10:34 AM  6CIT Screen  What Year? 0 points 0 points  What month? 0 points 0 points  What time? 0 points 0 points  Count back from 20 0 points 0 points  Months in reverse 0 points 0 points  Repeat phrase 0 points 0 points  Total Score 0 points 0 points    Immunizations Immunization History  Administered Date(s) Administered   Fluad Quad(high Dose 65+) 06/22/2020, 06/21/2021   Influenza Split 09/30/2011   Influenza,inj,Quad PF,6+ Mos 09/22/2013, 09/25/2014, 04/30/2015, 06/23/2016, 06/19/2018   PFIZER(Purple Top)SARS-COV-2 Vaccination 10/27/2019, 11/13/2019   Pneumococcal Conjugate-13 09/25/2014   Pneumococcal Polysaccharide-23 03/30/2015, 03/22/2020   Tdap 03/30/2015    TDAP status: Up to date  Flu Vaccine status: Up to date  Pneumococcal vaccine status: Up to date  Covid-19 vaccine status: Completed vaccines  Qualifies for Shingles Vaccine? Yes   Zostavax completed No   Shingrix Completed?: No.    Education has been provided regarding the importance of this vaccine. Patient has been advised to call insurance company to determine out of pocket expense if they have not yet received this vaccine. Advised may also receive vaccine at local pharmacy or Health Dept. Verbalized acceptance and understanding.  Screening Tests Health Maintenance  Topic Date Due   OPHTHALMOLOGY EXAM  Never done   FOOT EXAM  03/29/2017   COVID-19 Vaccine (3 - Pfizer risk series)  12/11/2019   INFLUENZA VACCINE  03/15/2023   Lung Cancer Screening  05/16/2023   HEMOGLOBIN A1C  08/24/2023   Diabetic kidney evaluation - eGFR measurement  02/21/2024   Diabetic kidney evaluation - Urine ACR  02/21/2024   Medicare Annual Wellness (AWV)  03/04/2024   DTaP/Tdap/Td (2 - Td or Tdap) 03/29/2025   Colonoscopy  08/22/2032   Pneumonia Vaccine 3+ Years old  Completed   Hepatitis C Screening  Completed   HPV VACCINES  Aged Out   Zoster Vaccines- Shingrix  Discontinued    Health Maintenance  Health Maintenance Due  Topic Date Due   OPHTHALMOLOGY EXAM  Never done   FOOT EXAM  03/29/2017   COVID-19 Vaccine (3 - Pfizer risk series) 12/11/2019    Colorectal cancer screening: Type of screening: Colonoscopy. Completed 08/22/2022. Repeat every 10 years  Lung Cancer Screening: (Low Dose CT Chest recommended if Age 74-80 years, 20 pack-year currently smoking OR have quit w/in 15years.) does qualify.   Lung Cancer Screening Referral: Has a screening 05/16/2023  Additional Screening:  Hepatitis C Screening: does qualify; Completed 01/15/2015  Vision Screening: Recommended annual ophthalmology exams for early detection of glaucoma and other disorders of the eye. Is the patient up to date with their annual eye exam?  No  Who is the provider or what is the name of the office in which the patient attends annual eye exams? [facility] If pt is not established with a provider, would they like to be referred to a provider to establish care? Yes .   Dental Screening: Recommended annual dental exams for proper oral hygiene  Diabetic Foot Exam: Diabetic Foot Exam: Completed 03/29/2016  Community Resource Referral / Chronic Care Management: CRR required this visit?  No   CCM required this visit?  No     Plan:     I have personally reviewed and noted the following in the patient's chart:   Medical and social history Use of alcohol, tobacco or illicit drugs  Current medications  and supplements including opioid prescriptions. Patient is currently taking opioid prescriptions. Information provided to patient regarding non-opioid alternatives. Patient advised to discuss non-opioid treatment plan with their provider. Functional ability and status Nutritional status Physical activity Advanced directives List of other physicians Hospitalizations, surgeries, and ER visits in previous 12 months Vitals Screenings to include cognitive, depression, and falls Referrals and appointments  In addition, I have reviewed and discussed with patient certain preventive protocols, quality metrics, and best practice recommendations. A written personalized care plan for preventive services as well as general preventive health recommendations were provided to patient.     Evaleen Sant L Maurianna Benard, CMA   03/05/2023   After Visit Summary: (MyChart) Due to this being a telephonic visit, the after visit summary with patients personalized plan was offered to patient via MyChart   Nurse Notes: Patient is not up to date with foot exam, his last foot exam was 03/29/2016.  He informs that he does not go to the eye Dr very often. When he does go he goes to [facility].   Medical screening examination/treatment/procedure(s) were performed by non-physician practitioner and as supervising physician I was immediately available for consultation/collaboration.  I agree with above. Jacinta Shoe, MD

## 2023-03-29 ENCOUNTER — Ambulatory Visit: Payer: Medicare Other | Admitting: Cardiovascular Disease

## 2023-04-18 ENCOUNTER — Ambulatory Visit: Payer: Self-pay | Admitting: Licensed Clinical Social Worker

## 2023-04-18 NOTE — Patient Outreach (Signed)
  Care Coordination   Follow Up Visit Note   04/18/2023 Name: [name] MRN: [id number] DOB: 1956/05/26  [name] is a 67 y.o. year old male who sees Plotnikov, Georgina Quint, MD for primary care. I spoke with  [name] by phone today.  What matters to the patients health and wellness today? Patient uses a cane to help him walk. He is at  risk for falls.     Goals Addressed             This Visit's Progress    Patient Stated he uses a cane to help him walk. He is at risk for falls       Interventions:  Spoke with client  via phone about client needs Discussed program support with RN, LCSW, Pharmacist Discussed family support. He has some support from his wife. He said his wife is waiting for delivery of oxygen system to their home to help his spouse with her breathing Discussed appetite. Discussed sleeping issues Discussed swelling in legs. He spoke of taking a diuretic. He tries to prop up his feet when he is able to do so. He said he still has edema issues in his legs. Spoke of client support with PCP, Dr. Posey Rea Discussed pain issues faced. He spoke of back pain issues. Discussed ambulation. He said he walks slowly. He said he sometimes gets short of breath in walking.  Discussed mood issues. He has stress in managing his health needs. He has some stress in managing health needs of his spouse Thanked client for phone call with LCSW Encouraged client to call LCSW as needed for SW support at [phone]          SDOH assessments and interventions completed:  Yes  SDOH Interventions Today    Flowsheet Row Most Recent Value  SDOH Interventions   Depression Interventions/Treatment  Counseling  Physical Activity Interventions Other (Comments)  [walks slowly to prevent falls. sometimes gets short of breath]  Stress Interventions Provide Counseling  [has stress in managing medical needs of his spouse]        Care Coordination Interventions:   Yes, provided   Interventions Today    Flowsheet Row Most Recent Value  Chronic Disease   Chronic disease during today's visit Other  [spoke with client about client needs]  General Interventions   General Interventions Discussed/Reviewed General Interventions Discussed, Community Resources  Exercise Interventions   Exercise Discussed/Reviewed Physical Activity  [walks slowly. gets short of breath occasionally]  Physical Activity Discussed/Reviewed Physical Activity Discussed  Education Interventions   Education Provided Provided Education  Provided Verbal Education On Community Resources  Mental Health Interventions   Mental Health Discussed/Reviewed Coping Strategies  [discussed mood issues. he has stress in managing his health needs]  Pharmacy Interventions   Pharmacy Dicussed/Reviewed Pharmacy Topics Discussed  Safety Interventions   Safety Discussed/Reviewed Fall Risk        Follow up plan: Follow up call scheduled for 06/11/23 at 3:00 PM     Encounter Outcome:  Patient Visit Completed   Kelton Pillar.Sky Primo MSW, LCSW Licensed Visual merchandiser [facility] Care Management [phone]

## 2023-04-18 NOTE — Patient Instructions (Signed)
Visit Information  Thank you for taking time to visit with me today. Please don't hesitate to contact me if I can be of assistance to you.   Following are the goals we discussed today:   Goals Addressed             This Visit's Progress    Patient Stated he uses a cane to help him walk. He is at risk for falls       Interventions:  Spoke with client  via phone about client needs Discussed program support with RN, LCSW, Pharmacist Discussed family support. He has some support from his wife. He said his wife is waiting for delivery of oxygen system to their home to help his spouse with her breathing Discussed appetite. Discussed sleeping issues Discussed swelling in legs. He spoke of taking a diuretic. He tries to prop up his feet when he is able to do so. He said he still has edema issues in his legs. Spoke of client support with PCP, Dr. Posey Rea Discussed pain issues faced. He spoke of back pain issues. Discussed ambulation. He said he walks slowly. He said he sometimes gets short of breath in walking.  Discussed mood issues. He has stress in managing his health needs. He has some stress in managing health needs of his spouse Thanked client for phone call with LCSW Encouraged client to call LCSW as needed for SW support at [phone]          Our next appointment is by telephone on 06/11/23 at 3:00 PM   Please call the care guide team at [phone] if you need to cancel or reschedule your appointment.   If you are experiencing a Mental Health or Behavioral Health Crisis or need someone to talk to, please go to [facility] [address], [place] [phone])   The patient verbalized understanding of instructions, educational materials, and care plan provided today and DECLINED offer to receive copy of patient instructions, educational materials, and care plan.   The patient has been provided with contact information for the care  management team and has been advised to call with any health related questions or concerns.   Kelton Pillar.Sakari Alkhatib MSW, LCSW Licensed Visual merchandiser [facility] Care Management [phone]

## 2023-05-08 NOTE — Progress Notes (Unsigned)
Cardiology Office Note    Patient Name: [name] Date of Encounter: 05/08/2023  Primary Care Provider:  Tresa Garter, MD Primary Cardiologist:  Charlton Haws, MD Primary Electrophysiologist: None   Past Medical History    Past Medical History:  Diagnosis Date   Anginal pain (HCC)    not had to use in awhile  none in 10 years   Anxiety    Basal cell carcinoma of nose    Blood dyscrasia    trouble clotting    Carotid artery disease (HCC)    s/p L CEA 2009 (hx of evacuation of hematoma due to heparin)   CHF (congestive heart failure) (HCC)    Chronic lower back pain    COPD (chronic obstructive pulmonary disease) (HCC)    Coronary artery disease    CABG 2005. s/p PTCA and stenting of the saphenous vein graft to PDA and saphenous vein graft to obtuse marginal by Dr. Excell Seltzer 09/29/11. 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   Myocardial infarction (HCC)    Obesity    Shortness of breath dyspnea    with exertion    Sleep apnea    "used to"      could not use 2006    History of Present Illness  [name] is a 67 y.o. 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  [name] has been followed by [name] for management of CAD for greater than 15 years.  When bypass surgery in  2005 and had experienced chest pain in 2013 with Myoview showing inferior wall lateral infarct and ischemia.  LHC was completed 09/2011 showing RCA occlusion as well as SVG to PDA 95% that was treated with DES to SVG-RI and Promus DES to the SVG-PDA.  He was seen in follow-up on 11/2017 with complaint of dyspnea and chest pain and underwent R/LHC on 11/2017 that showed SVG to RCA occlusion which was treated with DES and right heart cath revealed moderate pulmonary HTN.  He also experienced a brief run of slow VT versus AVNRT that resolved with no recurrence.  He was last seen by [name] on 09/22/2022 and denied any angina or chest pain.  BP was elevated and amlodipine 5 mg was added.  He is currently followed by Dr. Delton Coombes and went recent biopsy of the lungs that was negative for lymphoma.  He was experiencing weight loss and is currently being followed by Dr. Oncology.  During today's visit the patient reports*** .  Patient denies chest pain, palpitations, dyspnea, PND, orthopnea, nausea, vomiting, dizziness, syncope, edema, weight gain, or early satiety.  ***Notes: -Last ischemic evaluation: -Last echo: -Interim ED visits: Review of Systems  Please see the history  of present illness.    All other systems reviewed and are otherwise negative except as noted above.  Physical Exam    Wt Readings from Last 3 Encounters:  03/05/23 241 lb (109.3 kg)  02/21/23 241 lb (109.3 kg)  11/22/22 241 lb (109.3 kg)   WJ:XBJYN were no vitals filed for this visit.,There is no height or weight on file to calculate BMI. GEN: Well nourished, well developed in no acute distress Neck: No JVD; No carotid bruits Pulmonary: Clear to auscultation without rales, wheezing or rhonchi  Cardiovascular: Normal rate. Regular rhythm. Normal S1. Normal S2.   Murmurs: There is no murmur.  ABDOMEN: Soft, non-tender, non-distended EXTREMITIES:  No edema; No deformity   EKG/LABS/ Recent Cardiac Studies   ECG personally reviewed  by me today - ***  Risk Assessment/Calculations:   {Does this patient have ATRIAL FIBRILLATION?[phone]}      Lab Results  Component Value Date   WBC 5.9 02/21/2023   HGB 9.9 (L) 02/21/2023   HCT 31.3 (L) 02/21/2023   MCV 94.1 02/21/2023   PLT 140.0 (L) 02/21/2023   Lab Results  Component Value Date   CREATININE 1.15 02/21/2023   BUN 24 (H) 02/21/2023   NA 137 02/21/2023   K 4.4 02/21/2023   CL 84 (L) 02/21/2023   CO2 47 (H) 02/21/2023   Lab Results  Component Value Date   CHOL 97 02/21/2023   HDL 41.60 02/21/2023   LDLCALC 48 02/21/2023   TRIG 37.0 02/21/2023   CHOLHDL 2 02/21/2023    Lab Results  Component Value Date   HGBA1C 5.7 02/21/2023   Assessment & Plan    1.  Coronary artery disease: -s/p CABG x 3 in 2005 with PCI/DES to SVG to RCA in 2019 and previous PCI to SVG- Ramus and a DES to the SVG-PDA -Today patient reports***   2.  Essential hypertension: -Previous BP elevated and amlodipine 5 mg added -Today patient's blood pressure is***  3.  Aortic root dilation: -Most recent 2D echo completed with  4.  Hyperlipidemia: -Patient's last LDL was***  5.  COPD: -Secondary to asbestos exposure currently followed by Dr. Delton Coombes -Recent biopsy negative for recurrent lymphoma      Disposition: Follow-up with Charlton Haws, MD or APP in *** months {Are you ordering a CV Procedure (e.g. stress test, cath, DCCV, TEE, etc)?   Press F2        :[id number]}   Signed, Napoleon Form, Leodis Rains, NP 05/08/2023, 12:04 PM [facility]

## 2023-05-09 ENCOUNTER — Ambulatory Visit: Payer: Medicare Other | Attending: Cardiovascular Disease | Admitting: Nurse Practitioner

## 2023-05-09 ENCOUNTER — Encounter: Payer: Self-pay | Admitting: Nurse Practitioner

## 2023-05-09 VITALS — BP 122/60 | HR 73 | Ht 72.0 in | Wt 243.0 lb

## 2023-05-09 DIAGNOSIS — J449 Chronic obstructive pulmonary disease, unspecified: Secondary | ICD-10-CM | POA: Diagnosis present

## 2023-05-09 DIAGNOSIS — R601 Generalized edema: Secondary | ICD-10-CM | POA: Diagnosis present

## 2023-05-09 DIAGNOSIS — T148XXA Other injury of unspecified body region, initial encounter: Secondary | ICD-10-CM | POA: Diagnosis present

## 2023-05-09 DIAGNOSIS — E785 Hyperlipidemia, unspecified: Secondary | ICD-10-CM

## 2023-05-09 DIAGNOSIS — I77810 Thoracic aortic ectasia: Secondary | ICD-10-CM

## 2023-05-09 DIAGNOSIS — I10 Essential (primary) hypertension: Secondary | ICD-10-CM

## 2023-05-09 DIAGNOSIS — I2510 Atherosclerotic heart disease of native coronary artery without angina pectoris: Secondary | ICD-10-CM

## 2023-05-09 NOTE — Patient Instructions (Addendum)
Medication Instructions:  Your physician has recommended you make the following change in your medication:  For the next 2 days- Take your zaroxolyn 5 mg and torsemide 100 mg daily.   Continue a low sodium diet   Lab Work: Bmet in 1 week  If you have labs (blood work) drawn today and your tests are completely normal, you will receive your results only by: MyChart Message (if you have MyChart) OR A paper copy in the mail If you have any lab test that is abnormal or we need to change your treatment, we will call you to review the results.  Testing/Procedures: None ordered.  Follow-Up: At [facility], you and your health needs are our priority.  As part of our continuing mission to provide you with exceptional heart care, we have created designated Provider Care Teams.  These Care Teams include your primary Cardiologist (physician) and Advanced Practice Providers (APPs -  Physician Assistants and Nurse Practitioners) who all work together to provide you with the care you need, when you need it.   Your next appointment:   3 months  The format for your next appointment:   In Person  Provider:   Robin Searing, NP  Important Information About Sugar

## 2023-05-16 ENCOUNTER — Ambulatory Visit: Payer: Medicare Other

## 2023-05-18 ENCOUNTER — Other Ambulatory Visit (HOSPITAL_COMMUNITY): Payer: Self-pay | Admitting: Nurse Practitioner

## 2023-05-19 LAB — BASIC METABOLIC PANEL
BUN/Creatinine Ratio: 22 (ref 10–24)
BUN: 37 mg/dL — ABNORMAL HIGH (ref 8–27)
CO2: 42 mmol/L (ref 20–29)
Calcium: 9.8 mg/dL (ref 8.6–10.2)
Chloride: 81 mmol/L — ABNORMAL LOW (ref 96–106)
Creatinine, Ser: 1.69 mg/dL — ABNORMAL HIGH (ref 0.76–1.27)
Glucose: 104 mg/dL — ABNORMAL HIGH (ref 70–99)
Potassium: 4.7 mmol/L (ref 3.5–5.2)
Sodium: 139 mmol/L (ref 134–144)
eGFR: 43 mL/min/{1.73_m2} — ABNORMAL LOW (ref >59)

## 2023-05-20 ENCOUNTER — Other Ambulatory Visit: Payer: Self-pay | Admitting: Internal Medicine

## 2023-05-21 ENCOUNTER — Telehealth: Payer: Self-pay

## 2023-05-21 MED ORDER — TORSEMIDE 40 MG PO TABS: 40.0000 mg | ORAL_TABLET | Freq: Every day | ORAL | 3 refills | Status: DC

## 2023-05-21 NOTE — Telephone Encounter (Signed)
Called patient with changes. Patient coming in to see Alden Server this week, will set up lab work then.

## 2023-05-21 NOTE — Telephone Encounter (Signed)
-----   Message from Charlton Haws sent at 05/20/2023 11:14 AM EDT ----- BUN/Cr elevated would cut demadex back and repeat labs in 8 weeks  40 mg daily

## 2023-05-22 ENCOUNTER — Inpatient Hospital Stay: Payer: Medicare Other | Attending: Hematology and Oncology

## 2023-05-22 ENCOUNTER — Inpatient Hospital Stay (HOSPITAL_BASED_OUTPATIENT_CLINIC_OR_DEPARTMENT_OTHER): Payer: Medicare Other | Admitting: Hematology and Oncology

## 2023-05-22 ENCOUNTER — Other Ambulatory Visit: Payer: Self-pay

## 2023-05-22 ENCOUNTER — Telehealth: Payer: Self-pay

## 2023-05-22 ENCOUNTER — Encounter: Payer: Self-pay | Admitting: Hematology and Oncology

## 2023-05-22 VITALS — BP 130/51 | HR 70 | Temp 97.4°F | Resp 18 | Ht 72.0 in | Wt 246.6 lb

## 2023-05-22 DIAGNOSIS — C8228 Follicular lymphoma grade III, unspecified, lymph nodes of multiple sites: Secondary | ICD-10-CM | POA: Diagnosis not present

## 2023-05-22 DIAGNOSIS — R634 Abnormal weight loss: Secondary | ICD-10-CM | POA: Diagnosis not present

## 2023-05-22 DIAGNOSIS — N183 Chronic kidney disease, stage 3 unspecified: Secondary | ICD-10-CM | POA: Diagnosis not present

## 2023-05-22 DIAGNOSIS — Z79899 Other long term (current) drug therapy: Secondary | ICD-10-CM | POA: Insufficient documentation

## 2023-05-22 DIAGNOSIS — C8290 Follicular lymphoma, unspecified, unspecified site: Secondary | ICD-10-CM | POA: Insufficient documentation

## 2023-05-22 DIAGNOSIS — D539 Nutritional anemia, unspecified: Secondary | ICD-10-CM | POA: Insufficient documentation

## 2023-05-22 LAB — CBC WITH DIFFERENTIAL (CANCER CENTER ONLY)
Abs Immature Granulocytes: 0.02 10*3/uL (ref 0.00–0.07)
Basophils Absolute: 0.1 10*3/uL (ref 0.0–0.1)
Basophils Relative: 1 %
Eosinophils Absolute: 0.2 10*3/uL (ref 0.0–0.5)
Eosinophils Relative: 4 %
HCT: 32.3 % — ABNORMAL LOW (ref 39.0–52.0)
Hemoglobin: 9.6 g/dL — ABNORMAL LOW (ref 13.0–17.0)
Immature Granulocytes: 0 %
Lymphocytes Relative: 8 %
Lymphs Abs: 0.5 10*3/uL — ABNORMAL LOW (ref 0.7–4.0)
MCH: 29.1 pg (ref 26.0–34.0)
MCHC: 29.7 g/dL — ABNORMAL LOW (ref 30.0–36.0)
MCV: 97.9 fL (ref 80.0–100.0)
Monocytes Absolute: 1.1 10*3/uL — ABNORMAL HIGH (ref 0.1–1.0)
Monocytes Relative: 18 %
Neutro Abs: 4.1 10*3/uL (ref 1.7–7.7)
Neutrophils Relative %: 69 %
Platelet Count: 140 10*3/uL — ABNORMAL LOW (ref 150–400)
RBC: 3.3 MIL/uL — ABNORMAL LOW (ref 4.22–5.81)
RDW: 17.3 % — ABNORMAL HIGH (ref 11.5–15.5)
WBC Count: 5.9 10*3/uL (ref 4.0–10.5)
nRBC: 0 % (ref 0.0–0.2)

## 2023-05-22 LAB — COMPREHENSIVE METABOLIC PANEL
ALT: 13 U/L (ref 0–44)
AST: 22 U/L (ref 15–41)
Albumin: 4 g/dL (ref 3.5–5.0)
Alkaline Phosphatase: 90 U/L (ref 38–126)
BUN: 31 mg/dL — ABNORMAL HIGH (ref 8–23)
CO2: >45 mmol/L — ABNORMAL HIGH (ref 22–32)
Calcium: 9.9 mg/dL (ref 8.9–10.3)
Chloride: 87 mmol/L — ABNORMAL LOW (ref 98–111)
Creatinine, Ser: 1.3 mg/dL — ABNORMAL HIGH (ref 0.61–1.24)
GFR, Estimated: 59 mL/min — ABNORMAL LOW (ref >60)
Glucose, Bld: 99 mg/dL (ref 70–99)
Potassium: 3.7 mmol/L (ref 3.5–5.1)
Sodium: 140 mmol/L (ref 135–145)
Total Bilirubin: 0.8 mg/dL (ref 0.3–1.2)
Total Protein: 7.3 g/dL (ref 6.5–8.1)

## 2023-05-22 NOTE — Assessment & Plan Note (Signed)
The patient has not needed treatment for a long time Last year, he was evaluated by pulmonologist but biopsy was negative Plan to repeat imaging study again to rule out cancer reoccurrence

## 2023-05-22 NOTE — Telephone Encounter (Signed)
Called and given message that creatinine is better. He verbalized understanding. Told him that I would forward a message to cardiology and he may not need to repeat labs on 10/11.

## 2023-05-22 NOTE — Telephone Encounter (Signed)
-----   Message from Artis Delay sent at 05/22/2023  1:07 PM EDT ----- His creatinine is better, let him know His cardiologist has scheduled repeat labs on Friday, can you reach out to them and ask if he still need it done?

## 2023-05-22 NOTE — Assessment & Plan Note (Signed)
He has multifactorial anemia, likely due to anemia chronic illness from an resolving cellulitis/poor wound healing on his legs as well as recent renal failure He does not need transfusion support Observe closely

## 2023-05-22 NOTE — Assessment & Plan Note (Signed)
He reported abnormal weight loss I will order imaging study to rule out cancer reoccurrence

## 2023-05-22 NOTE — Assessment & Plan Note (Signed)
The patient had recent acute on chronic renal failure due to diuretic therapy His renal function has improved

## 2023-05-22 NOTE — Progress Notes (Signed)
[facility] OFFICE PROGRESS NOTE  Patient Care Team: Plotnikov, Georgina Quint, MD as PCP - General (Internal Medicine) Wendall Stade, MD as PCP - Cardiology (Cardiology) Wendall Stade, MD as Consulting Physician (Cardiology) Artis Delay, MD as Consulting Physician (Hematology and Oncology) Osborn Coho, MD (Inactive) as Consulting Physician (Otolaryngology) Cindee Salt, MD as Consulting Physician (Orthopedic Surgery) Kalman Shan, MD as Consulting Physician (Pulmonary Disease) Isaiah Blakes, LCSW as [facility] Care Management (Licensed Clinical Social Worker) [employer]  ASSESSMENT & PLAN:  Follicular lymphoma [facility]) The patient has not needed treatment for a long time Last year, he was evaluated by pulmonologist but biopsy was negative Plan to repeat imaging study again to rule out cancer reoccurrence  Deficiency anemia He has multifactorial anemia, likely due to anemia chronic illness from an resolving cellulitis/poor wound healing on his legs as well as recent renal failure He does not need transfusion support Observe closely  Weight loss, abnormal He reported abnormal weight loss I will order imaging study to rule out cancer reoccurrence  CKD (chronic kidney disease), stage III (HCC) The patient had recent acute on chronic renal failure due to diuretic therapy His renal function has improved   Orders Placed This Encounter  Procedures   NM PET Image Restage (PS) Skull Base to Thigh (F-18 FDG)    Standing Status:   Future    Standing Expiration Date:   05/21/2024    Order Specific Question:   If indicated for the ordered procedure, I authorize the administration of a radiopharmaceutical per Radiology protocol    Answer:   Yes    Order Specific Question:   Preferred imaging location?    Answer:   [facility]    Order Specific Question:   Radiology Contrast Protocol - do NOT remove file path    Answer:    [email or web address]    All questions were answered. The patient knows to call the clinic with any problems, questions or concerns. The total time spent in the appointment was 30 minutes encounter with patients including review of chart and various tests results, discussions about plan of care and coordination of care plan   Artis Delay, MD 05/22/2023 3:04 PM  INTERVAL HISTORY: Please see below for problem oriented charting. he returns for surveillance follow-up with his wife He has not been feeling well He has nonhealing wound for several months He was recently prescribed high-dose diuretic therapy due to chronic bilateral lower extremity edema He has no energy level Denies recent bleeding I reviewed his imaging study from last year and discussed his labs results and rationale for imaging study  REVIEW OF SYSTEMS:   Constitutional: Denies fevers, chills or abnormal weight loss Eyes: Denies blurriness of vision Ears, nose, mouth, throat, and face: Denies mucositis or sore throat Respiratory: Denies cough, dyspnea or wheezes Gastrointestinal:  Denies nausea, heartburn or change in bowel habits Lymphatics: Denies new lymphadenopathy  Neurological:Denies numbness, tingling or new weaknesses Behavioral/Psych: Mood is stable, no new changes  All other systems were reviewed with the patient and are negative.  I have reviewed the past medical history, past surgical history, social history and family history with the patient and they are unchanged from previous note.  ALLERGIES:  is allergic to bendamustine hcl, heparin, bendamustine hcl, and tape.  MEDICATIONS:  Current Outpatient Medications  Medication Sig Dispense Refill   albuterol (PROVENTIL) (2.5 MG/3ML) 0.083% nebulizer solution INHALE 1 VIAL VIA NEBULIZER EVERY DAY AS NEEDED FOR WHEEZING/SHORTNESS  OF BREATH 75 mL 5   aspirin EC 81 MG tablet Take 81 mg by mouth every evening.      aspirin-sod  bicarb-citric acid (ALKA-SELTZER) 325 MG TBEF tablet Take 650 mg by mouth every 6 (six) hours as needed (indigestion).     clonazePAM (KLONOPIN) 1 MG tablet Take 1 tablet (1 mg total) by mouth at bedtime. 30 tablet 5   clopidogrel (PLAVIX) 75 MG tablet TAKE 1 TABLET BY MOUTH EVERY DAY WITH BREAKFAST 90 tablet 3   fluticasone-salmeterol (ADVAIR HFA) 45-21 MCG/ACT inhaler Inhale 2 puffs into the lungs 2 (two) times daily. 1 each 1   metolazone (ZAROXOLYN) 5 MG tablet Take 1 tablet (5 mg total) by mouth daily as needed (swelling). 90 tablet 1   metoprolol succinate (TOPROL-XL) 25 MG 24 hr tablet Take 1 tablet (25 mg total) by mouth daily. Take with or immediately following a meal. 90 tablet 3   nitroGLYCERIN (NITROSTAT) 0.4 MG SL tablet Place 1 tablet (0.4 mg total) under the tongue every 5 (five) minutes as needed for chest pain (Up to 3 doses). 25 tablet 1   oxyCODONE-acetaminophen (PERCOCET) 7.5-325 MG tablet Take 1 tablet by mouth every 6 (six) hours as needed for severe pain. 120 tablet 0   oxymetazoline (AFRIN) 0.05 % nasal spray Place 1 spray into both nostrils 2 (two) times daily as needed for congestion.     senna-docusate (SENOKOT-S) 8.6-50 MG tablet Take 1-2 tablets by mouth at bedtime. 100 tablet 6   simvastatin (ZOCOR) 20 MG tablet Take 1 tablet (20 mg total) by mouth at bedtime. 90 tablet 3   torsemide 40 MG TABS Take 40 mg by mouth daily. 90 tablet 3   No current facility-administered medications for this visit.    SUMMARY OF ONCOLOGIC HISTORY: Oncology History Overview Note  FLIPI score of 2; stage 3 and areas of involvement >4   Follicular lymphoma (HCC)  06/24/2009 Imaging   PET CT scan showed two small FDG positive left neck nodes   12/11/2014 Surgery   He underwent excisional lymph node biopsy of the left supraclavicular lymph node/neck region   12/11/2014 Pathology Results   Accession: [id number] biopsy show follicular lymphoma   12/24/2014 Imaging   ECHO showed LVH but  preserved EF   12/25/2014 Imaging   PET scan showed disease above and below diaphragm   12/28/2014 Procedure   He has port placement   12/31/2014 - 01/01/2015 Chemotherapy   He received 1 cycle of bendamustine with rituximab, discontinued due to suspicion of allergic reaction to bendamustine   01/12/2015 - 01/19/2015 Hospital Admission   He was admitted to the hospital with suspicious allergic reaction, significant bilateral lower extremity edema with cellulitis, pneumonia and mild fluid overload   03/18/2015 - 04/08/2015 Chemotherapy   Treatment is switched to rituximab, Cytoxan, vincristine and prednisone   04/29/2015 Imaging    PET CT scan showed near complete response to treatment.   04/30/2015 - 01/12/2017 Chemotherapy   He received maintenance Rituxan   11/03/2016 PET scan   No metabolic evidence of recurrent lymphoma. Mild patchy marrow hypermetabolism throughout the axial skeleton, unchanged, probably due to a mildly reactive marrow state. 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   04/19/2017 Imaging   1. No evidence for lymphadenopathy in the chest, abdomen, or pelvis.  2.  Aortic Atherosclerois (ICD10-170.0) 3. 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 4. Bilateral nonobstructing nephrolithiasis. 5. Cholelithiasis. 6. Hepatic steatosis   05/18/2017 Procedure   Successful removal of implanted Port-A-Cath   05/16/2022 Imaging   1. Newly enlarged pretracheal lymph nodes measuring up to 1.8 x 1.3 cm , with additional newly prominent subcentimeter mediastinal and retroperitoneal lymph nodes in comparison to most recent examination is  dated 2018. These are nonspecific and possibly reactive, however lymphomatous recurrence is not excluded and further surveillance or metabolic characterization by PET-CT may be considered. 2. Emphysema and diffuse bilateral bronchial wall thickening. 3. 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 4. Cardiomegaly and coronary artery disease. 5. Cirrhosis. 6. Cholelithiasis. 7. Bilateral nonobstructive nephrolithiasis.       PHYSICAL EXAMINATION: ECOG PERFORMANCE STATUS: 1 - Symptomatic but completely ambulatory  Vitals:   05/22/23 1229  BP: (!) 130/51  Pulse: 70  Resp: 18  Temp: (!) 97.4 F (36.3 C)  SpO2: 92%   Filed Weights   05/22/23 1229  Weight: 246 lb 9.6 oz (111.9 kg)    GENERAL:alert, no distress and comfortable SKIN: Significant skin bruises on his upper extremity.  Significant bilateral extremity edema with nonhealing wound on the left leg NEURO: alert & oriented x 3 with fluent speech, no focal motor/sensory deficits  LABORATORY DATA:  I have reviewed the data as listed    Component Value Date/Time   NA 140 05/22/2023 1156   NA 139 05/18/2023 1512   NA 141 04/19/2017 0822   K 3.7 05/22/2023 1156   K 4.1 04/19/2017 0822   CL 87 (L) 05/22/2023 1156   CO2 >45 (H) 05/22/2023 1156   CO2 33 (H) 04/19/2017 0822   GLUCOSE 99 05/22/2023 1156   GLUCOSE 113 04/19/2017 0822   BUN 31 (H) 05/22/2023 1156   BUN 37 (H) 05/18/2023 1512   BUN 8.7 04/19/2017 0822   CREATININE 1.30 (H) 05/22/2023 1156   CREATININE 0.87 07/25/2018 1442   CREATININE 0.7 04/19/2017 0822   CALCIUM 9.9 05/22/2023 1156   CALCIUM 9.5 04/19/2017 0822   PROT 7.3 05/22/2023 1156   PROT 7.1 01/01/2018 0932   PROT 6.8 04/19/2017 0822   ALBUMIN 4.0 05/22/2023 1156   ALBUMIN 4.6 01/01/2018 0932   ALBUMIN 3.8 04/19/2017 0822   AST 22 05/22/2023 1156   AST 16 07/25/2018 1442   AST 18 04/19/2017 0822   ALT 13 05/22/2023 1156   ALT 15 07/25/2018 1442   ALT 21  04/19/2017 0822   ALKPHOS 90 05/22/2023 1156   ALKPHOS 82 04/19/2017 0822   BILITOT 0.8 05/22/2023 1156   BILITOT 0.3 07/25/2018 1442   BILITOT 0.53 04/19/2017 0822   GFRNONAA 59 (L) 05/22/2023 1156   GFRNONAA >60 07/25/2018 1442   GFRAA >60 04/27/2020 1327   GFRAA >60 07/25/2018 1442    No results found for: "SPEP", "UPEP"  Lab Results  Component Value Date   WBC 5.9 05/22/2023   NEUTROABS 4.1 05/22/2023   HGB 9.6 (L) 05/22/2023   HCT 32.3 (L) 05/22/2023   MCV 97.9 05/22/2023   PLT 140 (L) 05/22/2023      Chemistry      Component Value Date/Time   NA 140 05/22/2023 1156   NA 139 05/18/2023 1512   NA 141 04/19/2017 0822   K 3.7 05/22/2023 1156   K 4.1 04/19/2017 0822   CL 87 (L) 05/22/2023 1156   CO2 >45 (H) 05/22/2023 1156   CO2 33 (  H) 04/19/2017 0822   BUN 31 (H) 05/22/2023 1156   BUN 37 (H) 05/18/2023 1512   BUN 8.7 04/19/2017 0822   CREATININE 1.30 (H) 05/22/2023 1156   CREATININE 0.87 07/25/2018 1442   CREATININE 0.7 04/19/2017 0822      Component Value Date/Time   CALCIUM 9.9 05/22/2023 1156   CALCIUM 9.5 04/19/2017 0822   ALKPHOS 90 05/22/2023 1156   ALKPHOS 82 04/19/2017 0822   AST 22 05/22/2023 1156   AST 16 07/25/2018 1442   AST 18 04/19/2017 0822   ALT 13 05/22/2023 1156   ALT 15 07/25/2018 1442   ALT 21 04/19/2017 0822   BILITOT 0.8 05/22/2023 1156   BILITOT 0.3 07/25/2018 1442   BILITOT 0.53 04/19/2017 4098

## 2023-05-24 ENCOUNTER — Ambulatory Visit: Payer: Medicare Other | Admitting: Internal Medicine

## 2023-05-24 ENCOUNTER — Encounter: Payer: Self-pay | Admitting: Internal Medicine

## 2023-05-24 VITALS — BP 118/66 | HR 69 | Temp 97.8°F | Ht 72.0 in | Wt 252.0 lb

## 2023-05-24 DIAGNOSIS — I87313 Chronic venous hypertension (idiopathic) with ulcer of bilateral lower extremity: Secondary | ICD-10-CM

## 2023-05-24 DIAGNOSIS — C8228 Follicular lymphoma grade III, unspecified, lymph nodes of multiple sites: Secondary | ICD-10-CM

## 2023-05-24 DIAGNOSIS — E1151 Type 2 diabetes mellitus with diabetic peripheral angiopathy without gangrene: Secondary | ICD-10-CM

## 2023-05-24 DIAGNOSIS — L97919 Non-pressure chronic ulcer of unspecified part of right lower leg with unspecified severity: Secondary | ICD-10-CM

## 2023-05-24 DIAGNOSIS — L97929 Non-pressure chronic ulcer of unspecified part of left lower leg with unspecified severity: Secondary | ICD-10-CM

## 2023-05-24 DIAGNOSIS — R42 Dizziness and giddiness: Secondary | ICD-10-CM

## 2023-05-24 DIAGNOSIS — R609 Edema, unspecified: Secondary | ICD-10-CM | POA: Diagnosis not present

## 2023-05-24 DIAGNOSIS — W19XXXA Unspecified fall, initial encounter: Secondary | ICD-10-CM | POA: Diagnosis not present

## 2023-05-24 DIAGNOSIS — R270 Ataxia, unspecified: Secondary | ICD-10-CM

## 2023-05-24 MED ORDER — METOPROLOL SUCCINATE ER 25 MG PO TB24: 12.5000 mg | ORAL_TABLET | Freq: Every day | ORAL | Status: DC

## 2023-05-24 NOTE — Assessment & Plan Note (Signed)
PET CT is pending

## 2023-05-24 NOTE — Assessment & Plan Note (Addendum)
Worse B severe, multifactorial Take Demadex and Metolazone prn  Eating watermelon w/o salt Reduce salt  Risks associated with treatment and NAS diet noncompliance were discussed. Compliance was encouraged.

## 2023-05-24 NOTE — Assessment & Plan Note (Signed)
Worse Wound Clinic appt is pending Doppler US to r/o DVT

## 2023-05-24 NOTE — Assessment & Plan Note (Addendum)
He fell against a coffee table last night in the dream Head CT Get a nitro rolator walker HH ref was offered and declined

## 2023-05-24 NOTE — Assessment & Plan Note (Signed)
  On diet  

## 2023-05-24 NOTE — Patient Instructions (Signed)
nitro rolator walker

## 2023-05-24 NOTE — Progress Notes (Signed)
Subjective:  Patient ID: [name], male    DOB: November 11, 1955  Age: 67 y.o. MRN: [id number]  CC: Follow-up (3 [facility] F/U, Pt states he is having pain in his legs and notice more swelling and fluid build up in his legs a/w swelling in his thigh and pain. Pt states he fell last night and hit his head and left ribs. Pt states his ribs on left side hurts a/w harder to take in breaths.//Discuss pneumonia and Flu vaccine.)   HPI Klayton Levatino presents for CHF, dizzy spells, lymphoma, anemia and CKD He fell against a coffee table last night in the dream He is here w/his wife Talbert [place] [facility]  Outpatient Medications Prior to Visit  Medication Sig Dispense Refill   albuterol (PROVENTIL) (2.5 MG/3ML) 0.083% nebulizer solution INHALE 1 VIAL VIA NEBULIZER EVERY DAY AS NEEDED FOR WHEEZING/SHORTNESS OF BREATH 75 mL 5   aspirin EC 81 MG tablet Take 81 mg by mouth every evening.      aspirin-sod bicarb-citric acid (ALKA-SELTZER) 325 MG TBEF tablet Take 650 mg by mouth every 6 (six) hours as needed (indigestion).     clonazePAM (KLONOPIN) 1 MG tablet Take 1 tablet (1 mg total) by mouth at bedtime. 30 tablet 5   clopidogrel (PLAVIX) 75 MG tablet TAKE 1 TABLET BY MOUTH EVERY DAY WITH BREAKFAST 90 tablet 3   fluticasone-salmeterol (ADVAIR HFA) 45-21 MCG/ACT inhaler Inhale 2 puffs into the lungs 2 (two) times daily. 1 each 1   metolazone (ZAROXOLYN) 5 MG tablet Take 1 tablet (5 mg total) by mouth daily as needed (swelling). 90 tablet 1   oxyCODONE-acetaminophen (PERCOCET) 7.5-325 MG tablet Take 1 tablet by mouth every 6 (six) hours as needed for severe pain. 120 tablet 0   oxymetazoline (AFRIN) 0.05 % nasal spray Place 1 spray into both nostrils 2 (two) times daily as needed for congestion.     senna-docusate (SENOKOT-S) 8.6-50 MG tablet Take 1-2 tablets by mouth at bedtime. 100 tablet 6   simvastatin (ZOCOR) 20 MG tablet Take 1 tablet (20 mg total) by mouth at bedtime. 90 tablet 3   torsemide 40 MG  TABS Take 40 mg by mouth daily. 90 tablet 3   metoprolol succinate (TOPROL-XL) 25 MG 24 hr tablet Take 1 tablet (25 mg total) by mouth daily. Take with or immediately following a meal. 90 tablet 3   nitroGLYCERIN (NITROSTAT) 0.4 MG SL tablet Place 1 tablet (0.4 mg total) under the tongue every 5 (five) minutes as needed for chest pain (Up to 3 doses). 25 tablet 1   No facility-administered medications prior to visit.    ROS: Review of Systems  Constitutional:  Positive for fatigue. Negative for appetite change and unexpected weight change.  HENT:  Negative for congestion, nosebleeds, sneezing, sore throat and trouble swallowing.   Eyes:  Negative for itching and visual disturbance.  Respiratory:  Positive for shortness of breath. Negative for cough.   Cardiovascular:  Positive for leg swelling. Negative for chest pain and palpitations.  Gastrointestinal:  Negative for abdominal distention, blood in stool, diarrhea and nausea.  Genitourinary:  Negative for frequency and hematuria.  Musculoskeletal:  Positive for arthralgias, back pain, gait problem and neck stiffness. Negative for joint swelling and neck pain.  Skin:  Positive for color change, pallor, rash and wound.  Neurological:  Positive for dizziness, tremors and weakness. Negative for speech difficulty.  Hematological:  Bruises/bleeds easily.  Psychiatric/Behavioral:  Negative for agitation, dysphoric mood, hallucinations, sleep disturbance and suicidal  ideas. The patient is not nervous/anxious.     Objective:  BP 118/66 (BP Location: Right Arm, Patient Position: Sitting, Cuff Size: Normal)   Pulse 69   Temp 97.8 F (36.6 C) (Oral)   Ht 6' (1.829 m)   Wt 252 lb (114.3 kg)   SpO2 90%   BMI 34.18 kg/m   BP Readings from Last 3 Encounters:  05/24/23 118/66  05/22/23 (!) 130/51  05/09/23 122/60    Wt Readings from Last 3 Encounters:  05/24/23 252 lb (114.3 kg)  05/22/23 246 lb 9.6 oz (111.9 kg)  05/09/23 243 lb (110.2 kg)     Physical Exam Constitutional:      General: He is not in acute distress.    Appearance: He is well-developed. He is obese. He is ill-appearing. He is not toxic-appearing or diaphoretic.     Comments: NAD  Eyes:     Conjunctiva/sclera: Conjunctivae normal.     Pupils: Pupils are equal, round, and reactive to light.  Neck:     Thyroid: No thyromegaly.     Vascular: No JVD.  Cardiovascular:     Rate and Rhythm: Normal rate and regular rhythm.     Heart sounds: Normal heart sounds. No murmur heard.    No friction rub. No gallop.  Pulmonary:     Effort: Pulmonary effort is normal. No respiratory distress.     Breath sounds: Normal breath sounds. No wheezing or rales.  Chest:     Chest wall: No tenderness.  Abdominal:     General: Bowel sounds are normal. There is no distension.     Palpations: Abdomen is soft. There is no mass.     Tenderness: There is no abdominal tenderness. There is no guarding or rebound.  Musculoskeletal:        General: Tenderness present. Normal range of motion.     Cervical back: Normal range of motion.     Right lower leg: Edema present.     Left lower leg: Edema present.  Lymphadenopathy:     Cervical: No cervical adenopathy.  Skin:    General: Skin is warm and dry.     Findings: Bruising, erythema, lesion and rash present.  Neurological:     Mental Status: He is alert and oriented to person, place, and time.     Cranial Nerves: No cranial nerve deficit.     Motor: Weakness present. No abnormal muscle tone.     Coordination: Coordination abnormal.     Gait: Gait abnormal.     Deep Tendon Reflexes: Reflexes are normal and symmetric.  Psychiatric:        Behavior: Behavior normal.        Thought Content: Thought content normal.        Judgment: Judgment normal.   Ataxic LE w/2-3+ B edema L>R LLE w/ulcers Bruise on the forehead    A total time of 45 minutes was spent preparing to see the patient, reviewing tests, x-rays, operative reports  and other medical records.  Also, obtaining history and performing comprehensive physical exam.  Additionally, counseling the patient regarding the above listed issues - edema, fall, lymphoma.   Finally, documenting clinical information in the health records, coordination of care, educating the patient. It is a complex case.   Lab Results  Component Value Date   WBC 5.9 05/22/2023   HGB 9.6 (L) 05/22/2023   HCT 32.3 (L) 05/22/2023   PLT 140 (L) 05/22/2023   GLUCOSE 99 05/22/2023   CHOL 97  02/21/2023   TRIG 37.0 02/21/2023   HDL 41.60 02/21/2023   LDLCALC 48 02/21/2023   ALT 13 05/22/2023   AST 22 05/22/2023   NA 140 05/22/2023   K 3.7 05/22/2023   CL 87 (L) 05/22/2023   CREATININE 1.30 (H) 05/22/2023   BUN 31 (H) 05/22/2023   CO2 >45 (H) 05/22/2023   TSH 2.22 02/21/2023   PSA 0.23 09/15/2011   INR 1.0 11/28/2017   HGBA1C 5.7 02/21/2023   MICROALBUR 10.9 (H) 02/21/2023    No results found.  Assessment & Plan:   Problem List Items Addressed This Visit     Edema - Primary    Worse B severe, multifactorial Take Demadex and Metolazone prn  Eating watermelon w/o salt Reduce salt  Risks associated with treatment and NAS diet noncompliance were discussed. Compliance was encouraged.       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)    [facility] appt is pending Doppler US to r/o DVT      Relevant Medications   metoprolol succinate (TOPROL-XL) 25 MG 24 hr tablet   Diabetes mellitus type 2, controlled (HCC) (Chronic)    On diet      Follicular lymphoma (HCC)    PET CT is pending      Fall    He fell against a coffee table last night in the dream Head CT Get a nitro rolator walker HH ref was offered and declined      Relevant Orders   CT HEAD WO CONTRAST ( )   Dizzinesses    New Head CT S/p fall Reduce Metorolol due to low BP, dizziness      Relevant Orders    CT HEAD WO CONTRAST ( )   Other Visit Diagnoses     Ataxia       Relevant Orders   CT HEAD WO CONTRAST ( )         Meds ordered this encounter  Medications   metoprolol succinate (TOPROL-XL) 25 MG 24 hr tablet    Sig: Take 0.5 tablets (12.5 mg total) by mouth daily. Take with or immediately following a meal.      Follow-up: Return in about 4 weeks (around 06/21/2023) for a follow-up visit.  Sonda Primes, MD

## 2023-05-24 NOTE — Assessment & Plan Note (Addendum)
New Head CT S/p fall Reduce Metorolol due to low BP, dizziness

## 2023-05-25 ENCOUNTER — Ambulatory Visit: Payer: Medicare Other

## 2023-05-25 ENCOUNTER — Ambulatory Visit (HOSPITAL_COMMUNITY)
Admission: RE | Admit: 2023-05-25 | Discharge: 2023-05-25 | Disposition: A | Discharge status: Home / Self Care | Payer: Medicare Other | Source: Ambulatory Visit | Attending: Cardiology | Admitting: Cardiology

## 2023-05-25 ENCOUNTER — Ambulatory Visit
Admission: RE | Admit: 2023-05-25 | Discharge: 2023-05-25 | Disposition: A | Discharge status: Home / Self Care | Payer: Medicare Other | Source: Ambulatory Visit | Attending: Internal Medicine | Admitting: Internal Medicine

## 2023-05-25 DIAGNOSIS — W19XXXA Unspecified fall, initial encounter: Secondary | ICD-10-CM

## 2023-05-25 DIAGNOSIS — R609 Edema, unspecified: Secondary | ICD-10-CM | POA: Insufficient documentation

## 2023-05-25 DIAGNOSIS — R270 Ataxia, unspecified: Secondary | ICD-10-CM

## 2023-05-25 DIAGNOSIS — R42 Dizziness and giddiness: Secondary | ICD-10-CM

## 2023-06-04 ENCOUNTER — Encounter (HOSPITAL_COMMUNITY)
Admission: RE | Admit: 2023-06-04 | Discharge: 2023-06-04 | Disposition: A | Discharge status: Home / Self Care | Payer: Medicare Other | Source: Ambulatory Visit | Attending: Hematology and Oncology | Admitting: Hematology and Oncology

## 2023-06-04 DIAGNOSIS — C8228 Follicular lymphoma grade III, unspecified, lymph nodes of multiple sites: Secondary | ICD-10-CM | POA: Diagnosis present

## 2023-06-04 LAB — GLUCOSE, CAPILLARY: Glucose-Capillary: 99 mg/dL (ref 70–99)

## 2023-06-04 MED ORDER — FLUDEOXYGLUCOSE F - 18 (FDG) INJECTION
12.5700 | Freq: Once | INTRAVENOUS | Status: AC | PRN
  Administered 2023-06-04: 12.57 via INTRAVENOUS

## 2023-06-07 ENCOUNTER — Encounter (HOSPITAL_BASED_OUTPATIENT_CLINIC_OR_DEPARTMENT_OTHER): Payer: Medicare Other | Attending: General Surgery | Admitting: General Surgery

## 2023-06-07 DIAGNOSIS — E11622 Type 2 diabetes mellitus with other skin ulcer: Secondary | ICD-10-CM | POA: Diagnosis present

## 2023-06-07 DIAGNOSIS — E1151 Type 2 diabetes mellitus with diabetic peripheral angiopathy without gangrene: Secondary | ICD-10-CM | POA: Diagnosis not present

## 2023-06-07 DIAGNOSIS — J449 Chronic obstructive pulmonary disease, unspecified: Secondary | ICD-10-CM | POA: Diagnosis not present

## 2023-06-07 DIAGNOSIS — G4730 Sleep apnea, unspecified: Secondary | ICD-10-CM | POA: Diagnosis not present

## 2023-06-07 DIAGNOSIS — E1140 Type 2 diabetes mellitus with diabetic neuropathy, unspecified: Secondary | ICD-10-CM | POA: Diagnosis not present

## 2023-06-07 DIAGNOSIS — F17200 Nicotine dependence, unspecified, uncomplicated: Secondary | ICD-10-CM | POA: Diagnosis not present

## 2023-06-07 DIAGNOSIS — E785 Hyperlipidemia, unspecified: Secondary | ICD-10-CM | POA: Insufficient documentation

## 2023-06-07 DIAGNOSIS — I509 Heart failure, unspecified: Secondary | ICD-10-CM | POA: Diagnosis not present

## 2023-06-07 DIAGNOSIS — I872 Venous insufficiency (chronic) (peripheral): Secondary | ICD-10-CM | POA: Diagnosis not present

## 2023-06-07 DIAGNOSIS — I110 Hypertensive heart disease with heart failure: Secondary | ICD-10-CM | POA: Insufficient documentation

## 2023-06-07 DIAGNOSIS — L97822 Non-pressure chronic ulcer of other part of left lower leg with fat layer exposed: Secondary | ICD-10-CM | POA: Insufficient documentation

## 2023-06-07 DIAGNOSIS — I890 Lymphedema, not elsewhere classified: Secondary | ICD-10-CM | POA: Insufficient documentation

## 2023-06-07 DIAGNOSIS — M1990 Unspecified osteoarthritis, unspecified site: Secondary | ICD-10-CM | POA: Diagnosis not present

## 2023-06-07 DIAGNOSIS — I25119 Atherosclerotic heart disease of native coronary artery with unspecified angina pectoris: Secondary | ICD-10-CM | POA: Diagnosis not present

## 2023-06-07 NOTE — Progress Notes (Signed)
policy Notes: Venous Leg Ulcer Nursing Diagnoses: Actual venous  Insuffiency (use after diagnosis is confirmed) Knowledge deficit related to disease process and management Goals: Patient will maintain optimal edema control Date Initiated: 06/07/2023 Target Resolution Date: 07/05/2023 Goal Status: Active Interventions: Assess peripheral edema status every visit. Compression as ordered Provide education on venous insufficiency Treatment Activities: Therapeutic compression applied : 06/07/2023 Notes: Wound/Skin Impairment Nursing Diagnoses: Impaired tissue integrity Knowledge deficit related to smoking impact on wound healing Knowledge deficit related to ulceration/compromised skin integrity Goals: Patient will demonstrate a reduced rate of smoking or cessation of smoking Date Initiated: 06/07/2023 Target Resolution Date: 07/05/2023 Goal Status: Active Patient/caregiver will verbalize understanding of skin care regimen Date Initiated: 06/07/2023 Target Resolution Date: 07/05/2023 Goal Status: Active NEYLAND, BRUNNING ([id number]) [phone].pdf Page 7 of 11 Ulcer/skin breakdown will have a volume reduction of 30% by week 4 Date Initiated: 06/07/2023 Target Resolution Date: 07/05/2023 Goal Status: Active Interventions: Assess patient/caregiver ability to obtain necessary supplies Assess patient/caregiver ability to perform ulcer/skin care regimen upon admission and as needed Assess ulceration(s) every visit Provide education on ulcer and skin care Treatment Activities: Skin care regimen initiated : 06/07/2023 Topical wound management initiated : 06/07/2023 Notes: Electronic Signature(s) Signed: 06/07/2023 4:45:32 PM By: Zenaida Deed RN, BSN Entered By: Zenaida Deed on 06/07/2023 09:35:00 -------------------------------------------------------------------------------- Pain Assessment Details Patient Name: Date of Service: Phylliss Blakes. 06/07/2023 8:00 A M Medical Record Number: [id number] Patient Account  Number: [id number] Date of Birth/Sex: Treating RN: Nov 12, 1955 (67 y.o. M) Primary Care Kody Vigil: Tresa Garter Other Clinician: Referring Sophira Rumler: Treating Rhythm Gubbels/Extender: Laurence Ferrari, ERNEST Weeks in Treatment: 0 Active Problems Location of Pain Severity and Description of Pain Patient Has Paino Yes Site Locations Rate the pain. Current Pain Level: 8 Worst Pain Level: 10 Least Pain Level: 0 Tolerable Pain Level: 2 Character of Pain Describe the Pain: Aching, Burning, Stabbing, Throbbing Pain Management and Medication Current Pain Management: Electronic Signature(s) Signed: 06/07/2023 8:46:16 AM By: Dayton Scrape Entered By: Dayton Scrape on 06/07/2023 05:00:07 Delphina Cahill ([id number]) [id number] Page 8 of 11 -------------------------------------------------------------------------------- Patient/Caregiver Education Details Patient Name: Date of Service: Manetta, DO UGLA S W. 10/24/2024andnbsp8:00 A M Medical Record Number: [id number] Patient Account Number: [id number] Date of Birth/Gender: Treating RN: 11/11/1955 (67 y.o. Damaris Schooner Primary Care Physician: Tresa Garter Other Clinician: Referring Physician: Treating Physician/Extender: Laurence Ferrari, ERNEST Weeks in Treatment: 0 Education Assessment Education Provided To: Patient Education Topics Provided Venous: Methods: Explain/Verbal Responses: Reinforcements needed, State content correctly Wound/Skin Impairment: Methods: Explain/Verbal Responses: Reinforcements needed, State content correctly Electronic Signature(s) Signed: 06/07/2023 4:45:32 PM By: Zenaida Deed RN, BSN Entered By: Zenaida Deed on 06/07/2023 09:35:18 -------------------------------------------------------------------------------- Wound Assessment Details Patient Name: Date of Service: Phylliss Blakes. 06/07/2023 8:00 A M Medical Record Number: [id number] Patient  Account Number: [id number] Date of Birth/Sex: Treating RN: 11/09/1955 (67 y.o. Damaris Schooner Primary Care Yehuda Printup: Tresa Garter Other Clinician: Referring Cleston Lautner: Treating Harout Scheurich/Extender: Laurence Ferrari, ERNEST Weeks in Treatment: 0 Wound Status Wound Number: 5 Primary Lymphedema Etiology: Wound Location: Left, Anterior Lower Leg Wound Open Wounding Event: Blister Status: Date Acquired: 01/29/2023 Comorbid Cataracts, Lymphedema, Chronic Obstructive Pulmonary Disease Weeks Of Treatment: 0 History: (COPD), Sleep Apnea, Angina, Arrhythmia, Congestive Heart Clustered Wound: No Failure, Coronary Artery Disease, Hypertension, Myocardial Infarction, Peripheral Venous Disease, Osteoarthritis, Neuropathy, Received Chemotherapy Photos BURTON, DANZIGER ([id number]) [phone].pdf Page 9 of 11 Wound Measurements Length: (cm) 4.3 Width: (cm) 5.8 Depth: (cm) 0.1 Area: (cm) 19.588  Vitals Details Patient Name: Date of Service: JAHLEN, LOPEZHERNANDEZ. 06/07/2023 8:00 A M Medical Record  Number: [id number] Patient Account Number: [id number] Date of Birth/Sex: Treating RN: 07-17-1956 (67 y.o. M) Primary Care Sundance Moise: Tresa Garter Other Clinician: Referring Zephyr Sausedo: Treating Thorn Demas/Extender: Laurence Ferrari, ERNEST Weeks in Treatment: 0 Vital Signs Time Taken: 07:58 Temperature (F): 97.7 Height (in): 72 Pulse (bpm): 72 Weight (lbs): 252 Respiratory Rate (breaths/min): 18 Body Mass Index (BMI): 34.2 Blood Pressure (mmHg): 112/67 Reference Range: 80 - 120 mg / dl Electronic Signature(s) Signed: 06/07/2023 4:45:32 PM By: Zenaida Deed RN, BSN Entered By: Zenaida Deed on 06/07/2023 05:15:26  RN: November 19, 1955 (67 y.o. M) Primary Care Tippi Mccrae: Tresa Garter Other Clinician: Referring Charnel Giles: Treating Antwain Caliendo/Extender: Laurence Ferrari, ERNEST Weeks in Treatment[address] ([id number]) [id number].pdf Page 5 of 11 Vital Signs Height(in): 72 Pulse(bpm): 72 Weight(lbs): 252 Blood Pressure(mmHg): 112/67 Body Mass Index(BMI): 34.2 Temperature(F): 97.7 Respiratory Rate(breaths/min): 18 [5:Photos:] [6:No Photos] [N/A:N/A] Left, Anterior Lower Leg Left, Medial Lower Leg N/A Wound Location: Blister Blister N/A Wounding Event: Lymphedema Lymphedema N/A Primary Etiology: Cataracts, Lymphedema, Chronic Cataracts, Lymphedema, Chronic N/A Comorbid History: Obstructive Pulmonary Disease Obstructive Pulmonary Disease (COPD), Sleep Apnea,  Angina, (COPD), Sleep Apnea, Angina, Arrhythmia, Congestive Heart Failure, Arrhythmia, Congestive Heart Failure, Coronary Artery Disease, Coronary Artery Disease, Hypertension, Myocardial Infarction, Hypertension, Myocardial Infarction, Peripheral Venous Disease, Peripheral Venous Disease, Osteoarthritis, Neuropathy, Received Osteoarthritis, Neuropathy, Received Chemotherapy Chemotherapy 01/29/2023 01/29/2023 N/A Date Acquired: 0 0 N/A Weeks of Treatment: Open Open N/A Wound Status: No No N/A Wound Recurrence: 4.3x5.8x0.1 0.6x0.8x0.1 N/A Measurements L x W x D (cm) 19.588 0.377 N/A A (cm) : rea 1.959 0.038 N/A Volume (cm) : Full Thickness Without Exposed Full Thickness Without Exposed N/A Classification: Support Structures Support Structures Medium Medium N/A Exudate A mount: Serous Serous N/A Exudate Type: amber amber N/A Exudate Color: Flat and Intact Flat and Intact N/A Wound Margin: Large (67-100%) Large (67-100%) N/A Granulation A mount: Red Red N/A Granulation Quality: Small (1-33%) Small (1-33%) N/A Necrotic A mount: Fat Layer (Subcutaneous Tissue): Yes Fat Layer (Subcutaneous Tissue): Yes N/A Exposed Structures: Fascia: No Fascia: No Tendon: No Tendon: No Muscle: No Muscle: No Joint: No Joint: No Bone: No Bone: No Small (1-33%) Small (1-33%) N/A Epithelialization: Debridement - Selective/Open Wound Debridement - Selective/Open Wound N/A Debridement: Pre-procedure Verification/Time Out 09:00 09:00 N/A Taken: Lidocaine 4% Topical Solution Lidocaine 4% Topical Solution N/A Pain Control: Non-Viable Tissue Non-Viable Tissue N/A Level: 19.58 0.38 N/A Debridement A (sq cm): rea Curette Curette N/A Instrument: Minimum Minimum N/A Bleeding: Pressure Pressure N/A Hemostasis A chieved: 0 0 N/A Procedural Pain: 0 0 N/A Post Procedural Pain: Procedure was tolerated well Procedure was tolerated well N/A Debridement Treatment Response: 4.3x5.8x0.1  0.6x0.8x0.1 N/A Post Debridement Measurements L x W x D (cm) 1.959 0.038 N/A Post Debridement Volume: (cm) No Abnormalities Noted No Abnormalities Noted N/A Periwound Skin Texture: No Abnormalities Noted No Abnormalities Noted N/A Periwound Skin Moisture: Hemosiderin Staining: Yes Hemosiderin Staining: Yes N/A Periwound Skin Color: No Abnormality No Abnormality N/A Temperature: Yes Yes N/A Tenderness on Palpation: Compression Therapy Debridement N/A Procedures Performed: Debridement Treatment Notes Electronic Signature(s) Signed: 06/07/2023 9:10:49 AM By: Duanne Guess MD FACS [place], Robert Bellow ([id number]) AM By: Duanne Guess MD FACS [phone].pdf Page 6 of 11 Signed: 06/07/2023 9:10:49 Previous Signature: 06/07/2023 8:17:23 AM Version By: Duanne Guess MD FACS Entered By: Duanne Guess on 06/07/2023 06:10:49 -------------------------------------------------------------------------------- Multi-Disciplinary Care Plan Details Patient Name: Date of Service: Phylliss Blakes. 06/07/2023 8:00 A M Medical Record Number: [id number] Patient Account Number: [id number] Date of Birth/Sex: Treating RN: [birth year] (67 y.o. Damaris Schooner Primary Care Vannie Hilgert: Tresa Garter Other Clinician: Referring Oceane Fosse: Treating Doniqua Saxby/Extender: Dorien Chihuahua Weeks in Treatment: 0 Multidisciplinary Care Plan reviewed with physician Active Inactive Abuse / Safety / Falls / Self Care Management Nursing Diagnoses: Impaired physical mobility Potential for falls Goals: Patient/caregiver will verbalize/demonstrate measures taken to prevent injury and/or falls Date Initiated: 06/07/2023 Target Resolution Date: 07/05/2023 Goal Status: Active Interventions: Assess fall risk on admission and as needed Assess impairment of mobility on admission and as needed per  seat, etc.) INTERVENTIONS - Miscellaneous []  - 0 External ear exam []  - 0 Patient Transfer (multiple staff / Nurse, adult / Similar devices) []  - 0 Simple Staple / Suture removal (25 or less) []  - 0 Complex Staple / Suture removal (26 or more) []  - 0 Hypo/Hyperglycemic Management (do not check if billed separately) X- 1 15 Ankle / Brachial Index (ABI) - do not check if billed separately Has the patient been seen at the hospital within the last three years: Yes Total Score: 100 Level Of Care: New/Established - [address] VYRON, SHARPLEY ([id number]) [phone].pdf Page 3 of 11 Electronic Signature(s) Signed: 06/07/2023 4:45:32 PM By: Zenaida Deed RN, BSN Entered By: Zenaida Deed on 06/07/2023 09:36:03 -------------------------------------------------------------------------------- Compression Therapy Details Patient Name: Date of Service: Phylliss Blakes. 06/07/2023 8:00 A M Medical Record Number: [id number] Patient Account Number: [id number] Date of Birth/Sex: Treating RN: [birth year] (67 y.o. Damaris Schooner Primary Care Boone Gear: Tresa Garter Other Clinician: Referring Reegan Mctighe: Treating Kjell Brannen/Extender: Laurence Ferrari, ERNEST Weeks in Treatment: 0 Compression Therapy Performed for Wound Assessment: Wound #5 Left,Anterior Lower Leg Performed By: Clinician Zenaida Deed, RN Compression Type: Double Layer Post Procedure Diagnosis Same as Pre-procedure Notes urgo lite Electronic Signature(s) Signed: 06/07/2023 4:45:32 PM By: Zenaida Deed  RN, BSN Entered By: Zenaida Deed on 06/07/2023 06:01:52 -------------------------------------------------------------------------------- Encounter Discharge Information Details Patient Name: Date of Service: Phylliss Blakes. 06/07/2023 8:00 A M Medical Record Number: [id number] Patient Account Number: [id number] Date of Birth/Sex: Treating RN: 07-Jul-1956 (67 y.o. Damaris Schooner Primary Care Malanie Koloski: Tresa Garter Other Clinician: Referring Tarrin Lebow: Treating Bettye Sitton/Extender: Laurence Ferrari, ERNEST Weeks in Treatment: 0 Encounter Discharge Information Items Post Procedure Vitals Discharge Condition: Stable Temperature (F): 97.7 Ambulatory Status: Ambulatory Pulse (bpm): 72 Discharge Destination: Home Respiratory Rate (breaths/min): 18 Transportation: Private Auto Blood Pressure (mmHg): 112/67 Accompanied By: spouse Schedule Follow-up Appointment: Yes Clinical Summary of Care: Patient Declined Electronic Signature(s) Signed: 06/07/2023 4:45:32 PM By: Zenaida Deed RN, BSN Entered By: Zenaida Deed on 06/07/2023 06:28:03 Delphina Cahill ([id number]) [id number].pdf Page 4 of 11 -------------------------------------------------------------------------------- Lower Extremity Assessment Details Patient Name: Date of Service: BARRETT, WORLDS. 06/07/2023 8:00 A M Medical Record Number: [id number] Patient Account Number: [id number] Date of Birth/Sex: Treating RN: February 09, 1956 (67 y.o. Damaris Schooner Primary Care [place] Breeding: Tresa Garter Other Clinician: Referring Namiyah Grantham: Treating Lestat Golob/Extender: Laurence Ferrari, ERNEST Weeks in Treatment: 0 Edema Assessment Assessed: [Left: No] [Right: No] Edema: [Left: Yes] [Right: Yes] Calf Left: Right: Point of Measurement: 36 cm From Medial Instep 45.5 cm 44.5 cm Ankle Left: Right: Point of Measurement: 13 cm From Medial Instep 32 cm 32.3 cm Knee To Floor Left:  Right: From Medial Instep 46 cm 46 cm Vascular Assessment Pulses: Dorsalis Pedis Palpable: [Left:No] [Right:No] Extremity colors, hair growth, and conditions: Extremity Color: [Left:Hyperpigmented] [Right:Hyperpigmented] Hair Growth on Extremity: [Left:Yes] [Right:Yes] Temperature of Extremity: [Left:Warm] [Right:Warm] Capillary Refill: [Left:< 3 seconds] [Right:< 3 seconds] Dependent Rubor: [Left:No] [Right:No] Blanched when Elevated: [Left:No] [Right:No] Lipodermatosclerosis: [Left:No] [Right:No] Blood Pressure: Brachial: [Left:112] Ankle: [Left:Dorsalis Pedis: 100 0.89] Toe Nail Assessment Left: Right: Thick: Yes Yes Discolored: No No Deformed: No No Improper Length and Hygiene: No No Electronic Signature(s) Signed: 06/07/2023 4:45:32 PM By: Zenaida Deed RN, BSN Entered By: Zenaida Deed on 06/07/2023 05:41:40 -------------------------------------------------------------------------------- Multi Wound Chart Details Patient Name: Date of Service: Phylliss Blakes. 06/07/2023 8:00 A M Medical Record Number: [id number] Patient Account Number: [id number] Date of Birth/Sex: Treating  policy Notes: Venous Leg Ulcer Nursing Diagnoses: Actual venous  Insuffiency (use after diagnosis is confirmed) Knowledge deficit related to disease process and management Goals: Patient will maintain optimal edema control Date Initiated: 06/07/2023 Target Resolution Date: 07/05/2023 Goal Status: Active Interventions: Assess peripheral edema status every visit. Compression as ordered Provide education on venous insufficiency Treatment Activities: Therapeutic compression applied : 06/07/2023 Notes: Wound/Skin Impairment Nursing Diagnoses: Impaired tissue integrity Knowledge deficit related to smoking impact on wound healing Knowledge deficit related to ulceration/compromised skin integrity Goals: Patient will demonstrate a reduced rate of smoking or cessation of smoking Date Initiated: 06/07/2023 Target Resolution Date: 07/05/2023 Goal Status: Active Patient/caregiver will verbalize understanding of skin care regimen Date Initiated: 06/07/2023 Target Resolution Date: 07/05/2023 Goal Status: Active NEYLAND, BRUNNING ([id number]) [phone].pdf Page 7 of 11 Ulcer/skin breakdown will have a volume reduction of 30% by week 4 Date Initiated: 06/07/2023 Target Resolution Date: 07/05/2023 Goal Status: Active Interventions: Assess patient/caregiver ability to obtain necessary supplies Assess patient/caregiver ability to perform ulcer/skin care regimen upon admission and as needed Assess ulceration(s) every visit Provide education on ulcer and skin care Treatment Activities: Skin care regimen initiated : 06/07/2023 Topical wound management initiated : 06/07/2023 Notes: Electronic Signature(s) Signed: 06/07/2023 4:45:32 PM By: Zenaida Deed RN, BSN Entered By: Zenaida Deed on 06/07/2023 09:35:00 -------------------------------------------------------------------------------- Pain Assessment Details Patient Name: Date of Service: Phylliss Blakes. 06/07/2023 8:00 A M Medical Record Number: [id number] Patient Account  Number: [id number] Date of Birth/Sex: Treating RN: Nov 12, 1955 (67 y.o. M) Primary Care Kody Vigil: Tresa Garter Other Clinician: Referring Sophira Rumler: Treating Rhythm Gubbels/Extender: Laurence Ferrari, ERNEST Weeks in Treatment: 0 Active Problems Location of Pain Severity and Description of Pain Patient Has Paino Yes Site Locations Rate the pain. Current Pain Level: 8 Worst Pain Level: 10 Least Pain Level: 0 Tolerable Pain Level: 2 Character of Pain Describe the Pain: Aching, Burning, Stabbing, Throbbing Pain Management and Medication Current Pain Management: Electronic Signature(s) Signed: 06/07/2023 8:46:16 AM By: Dayton Scrape Entered By: Dayton Scrape on 06/07/2023 05:00:07 Delphina Cahill ([id number]) [id number] Page 8 of 11 -------------------------------------------------------------------------------- Patient/Caregiver Education Details Patient Name: Date of Service: Manetta, DO UGLA S W. 10/24/2024andnbsp8:00 A M Medical Record Number: [id number] Patient Account Number: [id number] Date of Birth/Gender: Treating RN: 11/11/1955 (67 y.o. Damaris Schooner Primary Care Physician: Tresa Garter Other Clinician: Referring Physician: Treating Physician/Extender: Laurence Ferrari, ERNEST Weeks in Treatment: 0 Education Assessment Education Provided To: Patient Education Topics Provided Venous: Methods: Explain/Verbal Responses: Reinforcements needed, State content correctly Wound/Skin Impairment: Methods: Explain/Verbal Responses: Reinforcements needed, State content correctly Electronic Signature(s) Signed: 06/07/2023 4:45:32 PM By: Zenaida Deed RN, BSN Entered By: Zenaida Deed on 06/07/2023 09:35:18 -------------------------------------------------------------------------------- Wound Assessment Details Patient Name: Date of Service: Phylliss Blakes. 06/07/2023 8:00 A M Medical Record Number: [id number] Patient  Account Number: [id number] Date of Birth/Sex: Treating RN: 11/09/1955 (67 y.o. Damaris Schooner Primary Care Yehuda Printup: Tresa Garter Other Clinician: Referring Cleston Lautner: Treating Harout Scheurich/Extender: Laurence Ferrari, ERNEST Weeks in Treatment: 0 Wound Status Wound Number: 5 Primary Lymphedema Etiology: Wound Location: Left, Anterior Lower Leg Wound Open Wounding Event: Blister Status: Date Acquired: 01/29/2023 Comorbid Cataracts, Lymphedema, Chronic Obstructive Pulmonary Disease Weeks Of Treatment: 0 History: (COPD), Sleep Apnea, Angina, Arrhythmia, Congestive Heart Clustered Wound: No Failure, Coronary Artery Disease, Hypertension, Myocardial Infarction, Peripheral Venous Disease, Osteoarthritis, Neuropathy, Received Chemotherapy Photos BURTON, DANZIGER ([id number]) [phone].pdf Page 9 of 11 Wound Measurements Length: (cm) 4.3 Width: (cm) 5.8 Depth: (cm) 0.1 Area: (cm) 19.588  Volume: (cm) 1.959 % Reduction in Area: % Reduction in Volume: Epithelialization: Small (1-33%) Tunneling: No Undermining: No Wound Description Classification: Full Thickness Without Exposed Support Structures Wound Margin: Flat and Intact Exudate Amount: Medium Exudate Type: Serous Exudate Color: amber Foul Odor After Cleansing: No Slough/Fibrino Yes Wound Bed Granulation Amount: Large (67-100%) Exposed Structure Granulation Quality: Red Fascia Exposed: No Necrotic Amount: Small (1-33%) Fat Layer (Subcutaneous Tissue) Exposed: Yes Necrotic Quality: Adherent Slough Tendon Exposed: No Muscle Exposed: No Joint Exposed: No Bone Exposed: No Periwound Skin Texture Texture Color No Abnormalities Noted: Yes No Abnormalities Noted: No Hemosiderin Staining: Yes Moisture No Abnormalities Noted: Yes Temperature / Pain Temperature: No Abnormality Tenderness on Palpation: Yes Treatment Notes Wound #5 (Lower Leg) Wound Laterality: Left,  Anterior Cleanser Peri-Wound Care Sween Lotion (Moisturizing lotion) Discharge Instruction: Apply moisturizing lotion as directed Topical Primary Dressing Maxorb Extra Ag+ Alginate Dressing, 4x4.75 (in/in) Discharge Instruction: Apply to wound bed as instructed Secondary Dressing ABD Pad, 8x10 Discharge Instruction: Apply over primary dressing as directed. Secured With Compression Wrap Urgo K2 Lite, (equivalent to a 3 layer) two layer compression system, regular Discharge Instruction: Apply Urgo K2 Lite as directed (alternative to 3 layer compression). Compression Stockings Add-Ons Electronic Signature(s) BAYRO, TORELL ([id number]) [id number] Page 10 of 11 Signed: 06/07/2023 4:45:32 PM By: Zenaida Deed RN, BSN Entered By: Zenaida Deed on 06/07/2023 05:43:51 -------------------------------------------------------------------------------- Wound Assessment Details Patient Name: Date of Service: Phylliss Blakes. 06/07/2023 8:00 A M Medical Record Number: [id number] Patient Account Number: [id number] Date of Birth/Sex: Treating RN: 1956/05/16 (67 y.o. Damaris Schooner Primary Care Abelina Ketron: Tresa Garter Other Clinician: Referring Salam Chesterfield: Treating Remie Mathison/Extender: Laurence Ferrari, ERNEST Weeks in Treatment: 0 Wound Status Wound Number: 6 Primary Lymphedema Etiology: Wound Location: Left, Medial Lower Leg Wound Open Wounding Event: Blister Status: Date Acquired: 01/29/2023 Comorbid Cataracts, Lymphedema, Chronic Obstructive Pulmonary Disease Weeks Of Treatment: 0 History: (COPD), Sleep Apnea, Angina, Arrhythmia, Congestive Heart Clustered Wound: No Failure, Coronary Artery Disease, Hypertension, Myocardial Infarction, Peripheral Venous Disease, Osteoarthritis, Neuropathy, Received Chemotherapy Wound Measurements Length: (cm) 0.6 Width: (cm) 0.8 Depth: (cm) 0.1 Area: (cm) 0.377 Volume: (cm) 0.038 % Reduction in  Area: % Reduction in Volume: Epithelialization: Small (1-33%) Tunneling: No Undermining: No Wound Description Classification: Full Thickness Without Exposed Support Structures Wound Margin: Flat and Intact Exudate Amount: Medium Exudate Type: Serous Exudate Color: amber Foul Odor After Cleansing: No Slough/Fibrino Yes Wound Bed Granulation Amount: Large (67-100%) Exposed Structure Granulation Quality: Red Fascia Exposed: No Necrotic Amount: Small (1-33%) Fat Layer (Subcutaneous Tissue) Exposed: Yes Necrotic Quality: Adherent Slough Tendon Exposed: No Muscle Exposed: No Joint Exposed: No Bone Exposed: No Periwound Skin Texture Texture Color No Abnormalities Noted: Yes No Abnormalities Noted: No Hemosiderin Staining: Yes Moisture No Abnormalities Noted: Yes Temperature / Pain Temperature: No Abnormality Tenderness on Palpation: Yes Treatment Notes Wound #6 (Lower Leg) Wound Laterality: Left, Medial Cleanser Peri-Wound Care Sween Lotion (Moisturizing lotion) Discharge Instruction: Apply moisturizing lotion as directed Topical [address] JAVONTAY, BASRA ([id number]) [phone].pdf Page 11 of 11 Maxorb Extra Ag+ Alginate Dressing, 4x4.75 (in/in) Discharge Instruction: Apply to wound bed as instructed Secondary Dressing ABD Pad, 8x10 Discharge Instruction: Apply over primary dressing as directed. Secured With Compression Wrap Urgo K2 Lite, (equivalent to a 3 layer) two layer compression system, regular Discharge Instruction: Apply Urgo K2 Lite as directed (alternative to 3 layer compression). Compression Stockings Add-Ons Electronic Signature(s) Signed: 06/07/2023 4:45:32 PM By: Zenaida Deed RN, BSN Entered By: Zenaida Deed on 06/07/2023 05:51:14 --------------------------------------------------------------------------------

## 2023-06-07 NOTE — Progress Notes (Addendum)
Dibiasio, DO UGLA S W. 06/07/2023 8:00 A M Medical Record Number: [id number] Patient Account Number: [id number] [name], [name] ([id number]) [id number].pdf Page 6 of 12 Date of Birth/Sex: Treating RN: 1956-03-04 (67 y.o. M) Primary Care Provider: Other Clinician: [name] Referring Provider: Treating Provider/Extender: [name],  [name] Weeks in Treatment: 0 Subjective Chief Complaint Information obtained from Patient Patient presents for treatment of an open ulcer due to venous insufficiency in setting DM 06/07/2023: here with ulcer of LLE, venous insufficiency, lymphedema History of Present Illness (HPI) The patient is a 67 yrs old wm here with multiple medical problems for treatment of his right leg ulcer. He has DM, hypertension, Hyperlipidemia and follicular neck lymphoma, active tobacco use. Due to prior potential allergic reaction to bendamustine (states legs turned black, was admitted to hospital), current lymphoma treatment to rituximab, Cytoxan, vincristine along with prednisone. 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 HbA1c 03/2015 6, Prealbumin 18, on supplements. 01/2015 ABI Right 0.73 Left 0.88 04/30/15 Repeat PET with near complete response to treatment. His oncologist noted that he has had poor tolerance to chemotherapy with recurrent infection and recommended discontinuation of chemotherapy and go on maintenance rituximab . ABI/TBI 03/2015: Right 0.76, monophasic AT biphasic PT venous reflux study- reflux noted in right GSV, left popliteal. , ; Current Profore lite, silver alginate to RLE and collagen to LLE. Refer back to [facility]- is patient surgical candidate given above and chronic ulcer. 07/12/15 Last seen a month ago. Current collagen and Juxtalite BLE. Only one area draining, nearly healed. F/u 2 weeks. READMISSION 06/07/2023 ***ABI L: 0.89*** This is a now 67 year old man previously seen in our clinic about 8 years ago for bilateral lower extremity edema and ulcerations secondary to venous insufficiency. He returns today ulcers on his left lower leg that started as blisters. Based upon the appearance of his legs, he has stage II lymphedema in addition to the chronic venous insufficiency previously diagnosed. Remarkably, he still has the juxta lite stockings from  2016 and wears them regularly. Patient History Information obtained from Patient. Allergies bendamustine HCl (Severity: Moderate, Reaction: rash, redman syndrome), heparin (Severity: Severe, Reaction: HIT), adhesive (Severity: Moderate, Reaction: rash) Family History Cancer - Mother, Heart Disease - Father, Hypertension - Father, No family history of Diabetes, Hereditary Spherocytosis, Kidney Disease, Lung Disease, Seizures, Stroke, Thyroid Problems, Tuberculosis. Social History Current some day smoker, Marital Status - Married, Alcohol Use - Never, Drug Use - No History, Caffeine Use - Daily. Medical History Eyes Patient has history of Cataracts - bil removed Denies history of Glaucoma, Optic Neuritis Ear/Nose/Mouth/Throat Denies history of Chronic sinus problems/congestion, Middle ear problems Hematologic/Lymphatic Patient has history of Lymphedema Denies history of Anemia, Hemophilia, Human Immunodeficiency Virus, Sickle Cell Disease Respiratory Patient has history of Chronic Obstructive Pulmonary Disease (COPD), Sleep Apnea Denies history of Aspiration, Asthma, Pneumothorax, Tuberculosis Cardiovascular Patient has history of Angina - none in 10 years, Arrhythmia - fluttering, Congestive Heart Failure, Coronary Artery Disease, Hypertension, Myocardial Infarction, Peripheral Venous Disease Denies history of Deep Vein Thrombosis, Hypotension, Peripheral Arterial Disease, Phlebitis, Vasculitis Gastrointestinal Denies history of Cirrhosis , Colitis, Crohns, Hepatitis A, Hepatitis B, Hepatitis C Endocrine Denies history of Type I Diabetes, Type II Diabetes Genitourinary Denies history of End Stage Renal Disease Immunological Denies history of Lupus Erythematosus, Raynauds, Scleroderma Integumentary (Skin) Denies history of History of Burn Musculoskeletal Patient has history of Osteoarthritis Denies history of Gout, Rheumatoid Arthritis, Osteomyelitis Neurologic Patient has  history of Neuropathy - toes Denies history  Coding Code Description [phone] Non-pressure chronic ulcer of other part of left lower leg with fat layer exposed I87.2 Venous insufficiency (chronic) (peripheral) E11.622 Type 2 diabetes mellitus with other skin ulcer I89.0 Lymphedema, not elsewhere classified Follow-up Appointments ppointment in 1 week. - Dr. Lady Gary RM 2 Return A Monday 11/4 @ 10:00 am Anesthetic Wound #5 Left,Anterior Lower Leg (In clinic) Topical Lidocaine 4% applied to wound bed Wound #6 Left,Medial Lower Leg (In clinic) Topical Lidocaine 4% applied to wound bed Bathing/ Shower/ Hygiene May shower with protection but do not get wound dressing(s) wet. Protect dressing(s) with water repellant cover (for  example, large plastic bag) or a cast cover and may then take shower. - may purchase cast cover at [facility], [facility] or [employer] in the wound care supply section Edema Control - Lymphedema / SCD / Other Elevate legs to the level of the heart or above for 30 minutes daily and/or when sitting for 3-4 times a day throughout the day. Avoid standing for long periods of time. Compression stocking or Garment 20-30 mm/Hg pressure to: - right leg daily Wound Treatment Wound #5 - Lower Leg Wound Laterality: Left, Anterior Peri-Wound Care: Sween Lotion (Moisturizing lotion) 1 x Per Week Discharge Instructions: Apply moisturizing lotion as directed Prim Dressing: Maxorb Extra Ag+ Alginate Dressing, 4x4.75 (in/in) 1 x Per Week ary Discharge Instructions: Apply to wound bed as instructed Secondary Dressing: ABD Pad, 8x10 1 x Per Week Discharge Instructions: Apply over primary dressing as directed. Compression Wrap: Urgo K2 Lite, (equivalent to a 3 layer) two layer compression system, regular 1 x Per Week Discharge Instructions: Apply Urgo K2 Lite as directed (alternative to 3 layer compression). Wound #6 - Lower Leg Wound Laterality: Left, Medial Peri-Wound Care: Sween Lotion (Moisturizing lotion) 1 x Per Week Discharge Instructions: Apply moisturizing lotion as directed Prim Dressing: Maxorb Extra Ag+ Alginate Dressing, 4x4.75 (in/in) 1 x Per Week ary Discharge Instructions: Apply to wound bed as instructed Secondary Dressing: ABD Pad, 8x10 1 x Per Week Discharge Instructions: Apply over primary dressing as directed. Compression Wrap: Urgo K2 Lite, (equivalent to a 3 layer) two layer compression system, regular 1 x Per Week Discharge Instructions: Apply Urgo K2 Lite as directed (alternative to 3 layer compression). [name], [name] ([id number]) [id number] Page 5 of 12 Patient Medications llergies: heparin, bendamustine HCl, adhesive A Notifications Medication Indication Start  End prior to debridement 06/07/2023 lidocaine DOSE topical 4 % cream - cream topical Electronic Signature(s) Signed: 06/07/2023 9:31:19 AM By: Scott Guess MD FACS Entered By: [name] on 06/07/2023 06:14:55 -------------------------------------------------------------------------------- Problem List Details Patient Name: Date of Service: Scott Blakes. 06/07/2023 8:00 A M Medical Record Number: [id number] Patient Account Number: [id number] Date of Birth/Sex: Treating RN: 07/25/1956 (67 y.o. M) Primary Care Provider: [name] Other Clinician: Referring Provider: Treating Provider/Extender: [name], [name] Weeks in Treatment: 0 Active Problems ICD-10 Encounter Code Description Active Date MDM Diagnosis [phone] Non-pressure chronic ulcer of other part of left lower leg with fat layer exposed10/24/2024 No Yes I87.2 Venous insufficiency (chronic) (peripheral) 06/07/2023 No Yes E11.622 Type 2 diabetes mellitus with other skin ulcer 06/07/2023 No Yes I89.0 Lymphedema, not elsewhere classified 06/07/2023 No Yes Inactive Problems Resolved Problems Electronic Signature(s) Signed: 06/07/2023 9:10:38 AM By: Scott Guess MD FACS Previous Signature: 06/07/2023 8:17:13 AM Version By: Scott Guess MD FACS Previous Signature: 06/07/2023 7:56:36 AM Version By: Scott Guess MD FACS Entered By: [name] on 06/07/2023 06:10:38 -------------------------------------------------------------------------------- Progress Note Details Patient Name: Date of Service:  Dibiasio, DO UGLA S W. 06/07/2023 8:00 A M Medical Record Number: [id number] Patient Account Number: [id number] [name], [name] ([id number]) [id number].pdf Page 6 of 12 Date of Birth/Sex: Treating RN: 1956-03-04 (67 y.o. M) Primary Care Provider: Other Clinician: [name] Referring Provider: Treating Provider/Extender: [name],  [name] Weeks in Treatment: 0 Subjective Chief Complaint Information obtained from Patient Patient presents for treatment of an open ulcer due to venous insufficiency in setting DM 06/07/2023: here with ulcer of LLE, venous insufficiency, lymphedema History of Present Illness (HPI) The patient is a 67 yrs old wm here with multiple medical problems for treatment of his right leg ulcer. He has DM, hypertension, Hyperlipidemia and follicular neck lymphoma, active tobacco use. Due to prior potential allergic reaction to bendamustine (states legs turned black, was admitted to hospital), current lymphoma treatment to rituximab, Cytoxan, vincristine along with prednisone. 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 HbA1c 03/2015 6, Prealbumin 18, on supplements. 01/2015 ABI Right 0.73 Left 0.88 04/30/15 Repeat PET with near complete response to treatment. His oncologist noted that he has had poor tolerance to chemotherapy with recurrent infection and recommended discontinuation of chemotherapy and go on maintenance rituximab . ABI/TBI 03/2015: Right 0.76, monophasic AT biphasic PT venous reflux study- reflux noted in right GSV, left popliteal. , ; Current Profore lite, silver alginate to RLE and collagen to LLE. [facility]er back to [facility]- is patient surgical candidate given above and chronic ulcer. 07/12/15 Last seen a month ago. Current collagen and Juxtalite BLE. Only one area draining, nearly healed. F/u 2 weeks. READMISSION 06/07/2023 ***ABI L: 0.89*** This is a now 67 year old man previously seen in our clinic about 8 years ago for bilateral lower extremity edema and ulcerations secondary to venous insufficiency. He returns today ulcers on his left lower leg that started as blisters. Based upon the appearance of his legs, he has stage II lymphedema in addition to the chronic venous insufficiency previously diagnosed. Remarkably, he still has the juxta lite stockings from  2016 and wears them regularly. Patient History Information obtained from Patient. Allergies bendamustine HCl (Severity: Moderate, Reaction: rash, redman syndrome), heparin (Severity: Severe, Reaction: HIT), adhesive (Severity: Moderate, Reaction: rash) Family History Cancer - Mother, Heart Disease - Father, Hypertension - Father, No family history of Diabetes, Hereditary Spherocytosis, Kidney Disease, Lung Disease, Seizures, Stroke, Thyroid Problems, Tuberculosis. Social History Current some day smoker, Marital Status - Married, Alcohol Use - Never, Drug Use - No History, Caffeine Use - Daily. Medical History Eyes Patient has history of Cataracts - bil removed Denies history of Glaucoma, Optic Neuritis Ear/Nose/Mouth/Throat Denies history of Chronic sinus problems/congestion, Middle ear problems Hematologic/Lymphatic Patient has history of Lymphedema Denies history of Anemia, Hemophilia, Human Immunodeficiency Virus, Sickle Cell Disease Respiratory Patient has history of Chronic Obstructive Pulmonary Disease (COPD), Sleep Apnea Denies history of Aspiration, Asthma, Pneumothorax, Tuberculosis Cardiovascular Patient has history of Angina - none in 10 years, Arrhythmia - fluttering, Congestive Heart Failure, Coronary Artery Disease, Hypertension, Myocardial Infarction, Peripheral Venous Disease Denies history of Deep Vein Thrombosis, Hypotension, Peripheral Arterial Disease, Phlebitis, Vasculitis Gastrointestinal Denies history of Cirrhosis , Colitis, Crohns, Hepatitis A, Hepatitis B, Hepatitis C Endocrine Denies history of Type I Diabetes, Type II Diabetes Genitourinary Denies history of End Stage Renal Disease Immunological Denies history of Lupus Erythematosus, Raynauds, Scleroderma Integumentary (Skin) Denies history of History of Burn Musculoskeletal Patient has history of Osteoarthritis Denies history of Gout, Rheumatoid Arthritis, Osteomyelitis Neurologic Patient has  history of Neuropathy - toes Denies history  [name], [name] ([id number]) [id number] Page 1 of 12 Visit Report for 06/07/2023 Chief Complaint Document Details Patient Name: Date of Service: [name], [name]. 06/07/2023 8:00 A M Medical Record Number: [id number] Patient Account Number: [id number] Date of Birth/Sex: Treating RN: 1956-03-16 (67 y.o. M) Primary Care Provider: [name] Other Clinician: Referring Provider: Treating Provider/Extender: [name], [name] Weeks in Treatment: 0 Information Obtained from: Patient Chief Complaint Patient presents for treatment of an open ulcer due to venous insufficiency in setting DM 06/07/2023: here with ulcer of LLE, venous insufficiency, lymphedema Electronic Signature(s) Signed: 06/07/2023 9:11:09 AM By: Scott Guess MD FACS Previous Signature: 06/07/2023 8:18:05 AM Version By: Scott Guess MD FACS Entered By: [name] on 06/07/2023 06:11:09 -------------------------------------------------------------------------------- Debridement Details Patient Name: Date of Service: Scott Blakes. 06/07/2023 8:00 A M Medical Record Number: [id number] Patient Account Number: [id number] Date of Birth/Sex: Treating RN: 1956-03-01 (67 y.o. [name] Primary Care Provider: [name] Other Clinician: Referring Provider: Treating Provider/Extender: [name], [name] Weeks in Treatment: 0 Debridement Performed for Assessment: Wound #5 Left,Anterior Lower Leg Performed By: Physician Scott Guess, MD The following information was scribed by: Zenaida Deed The information was scribed for: [name] Debridement Type: Debridement Level of Consciousness (Pre-procedure): Awake and Alert Pre-procedure Verification/Time Out Yes - 09:00 Taken: Start Time: 09:01 Pain Control: Lidocaine 4% T opical Solution Percent of Wound Bed Debrided: 100% T Area Debrided (cm): otal 19.58 Tissue and  other material debrided: Non-Viable, Biofilm Level: Non-Viable Tissue Debridement Description: Selective/Open Wound Instrument: Curette Bleeding: Minimum Hemostasis Achieved: Pressure Procedural Pain: 0 Post Procedural Pain: 0 Response to Treatment: Procedure was tolerated well Level of Consciousness (Post- Awake and Alert procedure): Post Debridement Measurements of Total Wound Length: (cm) 4.3 Width: (cm) 5.8 Depth: (cm) 0.1 [name], [name] ([id number]) [id number] Page 2 of 12 Volume: (cm) 1.959 Character of Wound/Ulcer Post Debridement: Improved Post Procedure Diagnosis Same as Pre-procedure Electronic Signature(s) Signed: 06/07/2023 9:31:19 AM By: Scott Guess MD FACS Signed: 06/07/2023 4:45:32 PM By: Zenaida Deed RN, BSN Entered By: Zenaida Deed on 06/07/2023 06:04:21 -------------------------------------------------------------------------------- Debridement Details Patient Name: Date of Service: Scott Blakes. 06/07/2023 8:00 A M Medical Record Number: [id number] Patient Account Number: [id number] Date of Birth/Sex: Treating RN: Jan 23, 1956 (67 y.o. [name] Primary Care Provider: [name] Other Clinician: Referring Provider: Treating Provider/Extender: [name], [name] Weeks in Treatment: 0 Debridement Performed for Assessment: Wound #6 Left,Medial Lower Leg Performed By: Physician Scott Guess, MD The following information was scribed by: Zenaida Deed The information was scribed for: [name] Debridement Type: Debridement Level of Consciousness (Pre-procedure): Awake and Alert Pre-procedure Verification/Time Out Yes - 09:00 Taken: Start Time: 09:01 Pain Control: Lidocaine 4% T opical Solution Percent of Wound Bed Debrided: 100% T Area Debrided (cm): otal 0.38 Tissue and other material debrided: Non-Viable, Biofilm Level: Non-Viable Tissue Debridement Description:  Selective/Open Wound Instrument: Curette Bleeding: Minimum Hemostasis Achieved: Pressure Procedural Pain: 0 Post Procedural Pain: 0 Response to Treatment: Procedure was tolerated well Level of Consciousness (Post- Awake and Alert procedure): Post Debridement Measurements of Total Wound Length: (cm) 0.6 Width: (cm) 0.8 Depth: (cm) 0.1 Volume: (cm) 0.038 Character of Wound/Ulcer Post Debridement: Improved Post Procedure Diagnosis Same as Pre-procedure Electronic Signature(s) Signed: 06/07/2023 9:31:19 AM By: Scott Guess MD FACS Signed: 06/07/2023 4:45:32 PM By: Zenaida Deed RN, BSN Entered By: Zenaida Deed on 06/07/2023 06:04:54 [name] ([id number]) [id number] Page 3 of 12 -------------------------------------------------------------------------------- HPI Details  Coding Code Description [phone] Non-pressure chronic ulcer of other part of left lower leg with fat layer exposed I87.2 Venous insufficiency (chronic) (peripheral) E11.622 Type 2 diabetes mellitus with other skin ulcer I89.0 Lymphedema, not elsewhere classified Follow-up Appointments ppointment in 1 week. - Dr. Lady Gary RM 2 Return A Monday 11/4 @ 10:00 am Anesthetic Wound #5 Left,Anterior Lower Leg (In clinic) Topical Lidocaine 4% applied to wound bed Wound #6 Left,Medial Lower Leg (In clinic) Topical Lidocaine 4% applied to wound bed Bathing/ Shower/ Hygiene May shower with protection but do not get wound dressing(s) wet. Protect dressing(s) with water repellant cover (for  example, large plastic bag) or a cast cover and may then take shower. - may purchase cast cover at [facility], [facility] or [employer] in the wound care supply section Edema Control - Lymphedema / SCD / Other Elevate legs to the level of the heart or above for 30 minutes daily and/or when sitting for 3-4 times a day throughout the day. Avoid standing for long periods of time. Compression stocking or Garment 20-30 mm/Hg pressure to: - right leg daily Wound Treatment Wound #5 - Lower Leg Wound Laterality: Left, Anterior Peri-Wound Care: Sween Lotion (Moisturizing lotion) 1 x Per Week Discharge Instructions: Apply moisturizing lotion as directed Prim Dressing: Maxorb Extra Ag+ Alginate Dressing, 4x4.75 (in/in) 1 x Per Week ary Discharge Instructions: Apply to wound bed as instructed Secondary Dressing: ABD Pad, 8x10 1 x Per Week Discharge Instructions: Apply over primary dressing as directed. Compression Wrap: Urgo K2 Lite, (equivalent to a 3 layer) two layer compression system, regular 1 x Per Week Discharge Instructions: Apply Urgo K2 Lite as directed (alternative to 3 layer compression). Wound #6 - Lower Leg Wound Laterality: Left, Medial Peri-Wound Care: Sween Lotion (Moisturizing lotion) 1 x Per Week Discharge Instructions: Apply moisturizing lotion as directed Prim Dressing: Maxorb Extra Ag+ Alginate Dressing, 4x4.75 (in/in) 1 x Per Week ary Discharge Instructions: Apply to wound bed as instructed Secondary Dressing: ABD Pad, 8x10 1 x Per Week Discharge Instructions: Apply over primary dressing as directed. Compression Wrap: Urgo K2 Lite, (equivalent to a 3 layer) two layer compression system, regular 1 x Per Week Discharge Instructions: Apply Urgo K2 Lite as directed (alternative to 3 layer compression). [name], [name] ([id number]) [id number] Page 5 of 12 Patient Medications llergies: heparin, bendamustine HCl, adhesive A Notifications Medication Indication Start  End prior to debridement 06/07/2023 lidocaine DOSE topical 4 % cream - cream topical Electronic Signature(s) Signed: 06/07/2023 9:31:19 AM By: Scott Guess MD FACS Entered By: [name] on 06/07/2023 06:14:55 -------------------------------------------------------------------------------- Problem List Details Patient Name: Date of Service: Scott Blakes. 06/07/2023 8:00 A M Medical Record Number: [id number] Patient Account Number: [id number] Date of Birth/Sex: Treating RN: 07/25/1956 (67 y.o. M) Primary Care Provider: [name] Other Clinician: Referring Provider: Treating Provider/Extender: [name], [name] Weeks in Treatment: 0 Active Problems ICD-10 Encounter Code Description Active Date MDM Diagnosis [phone] Non-pressure chronic ulcer of other part of left lower leg with fat layer exposed10/24/2024 No Yes I87.2 Venous insufficiency (chronic) (peripheral) 06/07/2023 No Yes E11.622 Type 2 diabetes mellitus with other skin ulcer 06/07/2023 No Yes I89.0 Lymphedema, not elsewhere classified 06/07/2023 No Yes Inactive Problems Resolved Problems Electronic Signature(s) Signed: 06/07/2023 9:10:38 AM By: Scott Guess MD FACS Previous Signature: 06/07/2023 8:17:13 AM Version By: Scott Guess MD FACS Previous Signature: 06/07/2023 7:56:36 AM Version By: Scott Guess MD FACS Entered By: [name] on 06/07/2023 06:10:38 -------------------------------------------------------------------------------- Progress Note Details Patient Name: Date of Service:  Dibiasio, DO UGLA S W. 06/07/2023 8:00 A M Medical Record Number: [id number] Patient Account Number: [id number] [name], [name] ([id number]) [id number].pdf Page 6 of 12 Date of Birth/Sex: Treating RN: 1956-03-04 (67 y.o. M) Primary Care Provider: Other Clinician: [name] Referring Provider: Treating Provider/Extender: [name],  [name] Weeks in Treatment: 0 Subjective Chief Complaint Information obtained from Patient Patient presents for treatment of an open ulcer due to venous insufficiency in setting DM 06/07/2023: here with ulcer of LLE, venous insufficiency, lymphedema History of Present Illness (HPI) The patient is a 67 yrs old wm here with multiple medical problems for treatment of his right leg ulcer. He has DM, hypertension, Hyperlipidemia and follicular neck lymphoma, active tobacco use. Due to prior potential allergic reaction to bendamustine (states legs turned black, was admitted to hospital), current lymphoma treatment to rituximab, Cytoxan, vincristine along with prednisone. 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 HbA1c 03/2015 6, Prealbumin 18, on supplements. 01/2015 ABI Right 0.73 Left 0.88 04/30/15 Repeat PET with near complete response to treatment. His oncologist noted that he has had poor tolerance to chemotherapy with recurrent infection and recommended discontinuation of chemotherapy and go on maintenance rituximab . ABI/TBI 03/2015: Right 0.76, monophasic AT biphasic PT venous reflux study- reflux noted in right GSV, left popliteal. , ; Current Profore lite, silver alginate to RLE and collagen to LLE. [facility]er back to [facility]- is patient surgical candidate given above and chronic ulcer. 07/12/15 Last seen a month ago. Current collagen and Juxtalite BLE. Only one area draining, nearly healed. F/u 2 weeks. READMISSION 06/07/2023 ***ABI L: 0.89*** This is a now 67 year old man previously seen in our clinic about 8 years ago for bilateral lower extremity edema and ulcerations secondary to venous insufficiency. He returns today ulcers on his left lower leg that started as blisters. Based upon the appearance of his legs, he has stage II lymphedema in addition to the chronic venous insufficiency previously diagnosed. Remarkably, he still has the juxta lite stockings from  2016 and wears them regularly. Patient History Information obtained from Patient. Allergies bendamustine HCl (Severity: Moderate, Reaction: rash, redman syndrome), heparin (Severity: Severe, Reaction: HIT), adhesive (Severity: Moderate, Reaction: rash) Family History Cancer - Mother, Heart Disease - Father, Hypertension - Father, No family history of Diabetes, Hereditary Spherocytosis, Kidney Disease, Lung Disease, Seizures, Stroke, Thyroid Problems, Tuberculosis. Social History Current some day smoker, Marital Status - Married, Alcohol Use - Never, Drug Use - No History, Caffeine Use - Daily. Medical History Eyes Patient has history of Cataracts - bil removed Denies history of Glaucoma, Optic Neuritis Ear/Nose/Mouth/Throat Denies history of Chronic sinus problems/congestion, Middle ear problems Hematologic/Lymphatic Patient has history of Lymphedema Denies history of Anemia, Hemophilia, Human Immunodeficiency Virus, Sickle Cell Disease Respiratory Patient has history of Chronic Obstructive Pulmonary Disease (COPD), Sleep Apnea Denies history of Aspiration, Asthma, Pneumothorax, Tuberculosis Cardiovascular Patient has history of Angina - none in 10 years, Arrhythmia - fluttering, Congestive Heart Failure, Coronary Artery Disease, Hypertension, Myocardial Infarction, Peripheral Venous Disease Denies history of Deep Vein Thrombosis, Hypotension, Peripheral Arterial Disease, Phlebitis, Vasculitis Gastrointestinal Denies history of Cirrhosis , Colitis, Crohns, Hepatitis A, Hepatitis B, Hepatitis C Endocrine Denies history of Type I Diabetes, Type II Diabetes Genitourinary Denies history of End Stage Renal Disease Immunological Denies history of Lupus Erythematosus, Raynauds, Scleroderma Integumentary (Skin) Denies history of History of Burn Musculoskeletal Patient has history of Osteoarthritis Denies history of Gout, Rheumatoid Arthritis, Osteomyelitis Neurologic Patient has  history of Neuropathy - toes Denies history  [name], [name] ([id number]) [id number] Page 1 of 12 Visit Report for 06/07/2023 Chief Complaint Document Details Patient Name: Date of Service: [name], [name]. 06/07/2023 8:00 A M Medical Record Number: [id number] Patient Account Number: [id number] Date of Birth/Sex: Treating RN: 1956-03-16 (67 y.o. M) Primary Care Provider: [name] Other Clinician: Referring Provider: Treating Provider/Extender: [name], [name] Weeks in Treatment: 0 Information Obtained from: Patient Chief Complaint Patient presents for treatment of an open ulcer due to venous insufficiency in setting DM 06/07/2023: here with ulcer of LLE, venous insufficiency, lymphedema Electronic Signature(s) Signed: 06/07/2023 9:11:09 AM By: Scott Guess MD FACS Previous Signature: 06/07/2023 8:18:05 AM Version By: Scott Guess MD FACS Entered By: [name] on 06/07/2023 06:11:09 -------------------------------------------------------------------------------- Debridement Details Patient Name: Date of Service: Scott Blakes. 06/07/2023 8:00 A M Medical Record Number: [id number] Patient Account Number: [id number] Date of Birth/Sex: Treating RN: 1956-03-01 (67 y.o. [name] Primary Care Provider: [name] Other Clinician: Referring Provider: Treating Provider/Extender: [name], [name] Weeks in Treatment: 0 Debridement Performed for Assessment: Wound #5 Left,Anterior Lower Leg Performed By: Physician Scott Guess, MD The following information was scribed by: Zenaida Deed The information was scribed for: [name] Debridement Type: Debridement Level of Consciousness (Pre-procedure): Awake and Alert Pre-procedure Verification/Time Out Yes - 09:00 Taken: Start Time: 09:01 Pain Control: Lidocaine 4% T opical Solution Percent of Wound Bed Debrided: 100% T Area Debrided (cm): otal 19.58 Tissue and  other material debrided: Non-Viable, Biofilm Level: Non-Viable Tissue Debridement Description: Selective/Open Wound Instrument: Curette Bleeding: Minimum Hemostasis Achieved: Pressure Procedural Pain: 0 Post Procedural Pain: 0 Response to Treatment: Procedure was tolerated well Level of Consciousness (Post- Awake and Alert procedure): Post Debridement Measurements of Total Wound Length: (cm) 4.3 Width: (cm) 5.8 Depth: (cm) 0.1 [name], [name] ([id number]) [id number] Page 2 of 12 Volume: (cm) 1.959 Character of Wound/Ulcer Post Debridement: Improved Post Procedure Diagnosis Same as Pre-procedure Electronic Signature(s) Signed: 06/07/2023 9:31:19 AM By: Scott Guess MD FACS Signed: 06/07/2023 4:45:32 PM By: Zenaida Deed RN, BSN Entered By: Zenaida Deed on 06/07/2023 06:04:21 -------------------------------------------------------------------------------- Debridement Details Patient Name: Date of Service: Scott Blakes. 06/07/2023 8:00 A M Medical Record Number: [id number] Patient Account Number: [id number] Date of Birth/Sex: Treating RN: Jan 23, 1956 (67 y.o. [name] Primary Care Provider: [name] Other Clinician: Referring Provider: Treating Provider/Extender: [name], [name] Weeks in Treatment: 0 Debridement Performed for Assessment: Wound #6 Left,Medial Lower Leg Performed By: Physician Scott Guess, MD The following information was scribed by: Zenaida Deed The information was scribed for: [name] Debridement Type: Debridement Level of Consciousness (Pre-procedure): Awake and Alert Pre-procedure Verification/Time Out Yes - 09:00 Taken: Start Time: 09:01 Pain Control: Lidocaine 4% T opical Solution Percent of Wound Bed Debrided: 100% T Area Debrided (cm): otal 0.38 Tissue and other material debrided: Non-Viable, Biofilm Level: Non-Viable Tissue Debridement Description:  Selective/Open Wound Instrument: Curette Bleeding: Minimum Hemostasis Achieved: Pressure Procedural Pain: 0 Post Procedural Pain: 0 Response to Treatment: Procedure was tolerated well Level of Consciousness (Post- Awake and Alert procedure): Post Debridement Measurements of Total Wound Length: (cm) 0.6 Width: (cm) 0.8 Depth: (cm) 0.1 Volume: (cm) 0.038 Character of Wound/Ulcer Post Debridement: Improved Post Procedure Diagnosis Same as Pre-procedure Electronic Signature(s) Signed: 06/07/2023 9:31:19 AM By: Scott Guess MD FACS Signed: 06/07/2023 4:45:32 PM By: Zenaida Deed RN, BSN Entered By: Zenaida Deed on 06/07/2023 06:04:54 [name] ([id number]) [id number] Page 3 of 12 -------------------------------------------------------------------------------- HPI Details  Patient Name: Date of Service: [name], [name]. 06/07/2023 8:00 A M Medical Record Number: [id number] Patient Account Number: [id number] Date of Birth/Sex: Treating RN: [birth year] (67 y.o. M) Primary Care Provider: [name] Other Clinician: Referring Provider: Treating Provider/Extender: [name], [name] Weeks in Treatment: 0 History of Present Illness HPI Description: The patient is a 67 yrs old wm here with multiple medical problems for treatment of his right leg ulcer. He has DM, hypertension, Hyperlipidemia and follicular neck lymphoma, active tobacco use. Due to prior potential allergic reaction to bendamustine (states legs turned black, was admitted to hospital), current lymphoma treatment to rituximab, Cytoxan, vincristine along with prednisone. 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 HbA1c 03/2015 6, Prealbumin 18, on supplements. 01/2015 ABI Right 0.73 Left 0.88 04/30/15 Repeat PET with near complete response to treatment. His oncologist noted that he has had poor tolerance to chemotherapy with recurrent  infection and recommended discontinuation of chemotherapy and go on maintenance rituximab . ABI/TBI 03/2015: Right 0.76, monophasic AT biphasic PT venous reflux study- reflux noted in right GSV, left popliteal. , ; Current Profore lite, silver alginate to RLE and collagen to LLE. Refer back to [facility]- is patient surgical candidate given above and chronic ulcer. 07/12/15 Last seen a month ago. Current collagen and Juxtalite BLE. Only one area draining, nearly healed. F/u 2 weeks. READMISSION 06/07/2023 ***ABI L: 0.89*** This is a now 67 year old man previously seen in our clinic about 8 years ago for bilateral lower extremity edema and ulcerations secondary to venous insufficiency. He returns today ulcers on his left lower leg that started as blisters. Based upon the appearance of his legs, he has stage II lymphedema in addition to the chronic venous insufficiency previously diagnosed. Remarkably, he still has the juxta lite stockings from 2016 and wears them regularly. Electronic Signature(s) Signed: 06/07/2023 9:12:55 AM By: Scott Guess MD FACS Previous Signature: 06/07/2023 8:23:36 AM Version By: Scott Guess MD FACS Entered By: [name] on 06/07/2023 06:12:55 -------------------------------------------------------------------------------- Physical Exam Details Patient Name: Date of Service: Scott Blakes. 06/07/2023 8:00 A M Medical Record Number: [id number] Patient Account Number: [id number] Date of Birth/Sex: Treating RN: 31-Dec-1955 (67 y.o. M) Primary Care Provider: [name] Other Clinician: Referring Provider: Treating Provider/Extender: [name], [name] Weeks in Treatment: 0 Constitutional . . . . No acute distress. Respiratory Normal work of breathing on supplemental oxygen. Cardiovascular 2+ pitting edema bilaterally, thickened skin consistent with chronic lymphedema and hemosiderin deposits suggestive of chronic venous  insufficiency.. Notes 06/07/2023: On the left anterior tibial surface and left medial lower leg, he has superficial ulceration that is consistent with a history of blister formation and rupture. He also has some unruptured blisters on the lateral aspect of the same leg. No concern for infection. Electronic Signature(s) Signed: 06/07/2023 9:15:25 AM By: Scott Guess MD FACS Previous Signature: 06/07/2023 9:14:30 AM Version By: Scott Guess MD FACS Entered By: [name] on 06/07/2023 06:15:25 [name] ([id number]) [id number] Page 4 of 12 -------------------------------------------------------------------------------- Physician Orders Details Patient Name: Date of Service: [name], [name]. 06/07/2023 8:00 A M Medical Record Number: [id number] Patient Account Number: [id number] Date of Birth/Sex: Treating RN: [birth year] (66 y.o. [name] Primary Care Provider: [name] Other Clinician: Referring Provider: Treating Provider/Extender: [name], [name] Weeks in Treatment: 0 The following information was scribed by: Zenaida Deed The information was scribed for: [name] Verbal / Phone Orders: No Diagnosis Coding ICD-10  for: Cirrhosis ; Colitis; Crohns; Hepatitis A; Hepatitis B; Hepatitis C Past Medical History Notes: GERD Endocrine Complaints and Symptoms: Negative for: Heat/cold intolerance Medical History: Negative for: Type I Diabetes; Type II Diabetes Integumentary (Skin) Complaints and Symptoms: Positive for: Wounds - left lower leg Medical History: Negative for: History of Burn Past Medical History Notes: basal cell carcinoma of back and nose Musculoskeletal Complaints and Symptoms: Negative for: Muscle Pain; Muscle Weakness Medical History: Positive for: Osteoarthritis Negative for: Gout; Rheumatoid Arthritis; Osteomyelitis Neurologic Complaints and Symptoms: Positive for: Numbness/parasthesias Medical History: Positive for: Neuropathy - toes Negative for: Dementia; Quadriplegia; Paraplegia; Seizure Disorder Past Medical History Notes: chronic lower back pain Psychiatric Complaints and Symptoms: Negative for:  Claustrophobia Medical History: Negative for: Anorexia/bulimia; Confinement Anxiety Past Medical History Notes: depression Hematologic/Lymphatic Medical History: Positive for: Lymphedema Negative for: Anemia; Hemophilia; Human Immunodeficiency Virus; Sickle Cell Disease Past Medical History Notes: heparin induced thrombocytopenia, blood dyscrasia Genitourinary Medical History: Negative for: End Stage Renal Disease Past Medical History Notes: CKD stage lll DONNIS, SNOWDON ([id number]) [id number] Page 11 of 12 Immunological Medical History: Negative for: Lupus Erythematosus; Raynauds; Scleroderma Oncologic Medical History: Positive for: Received Chemotherapy Negative for: Received Radiation Past Medical History Notes: basal cell carcinoma removed from nose, lymphoma HBO Extended History Items Eyes: Cataracts Immunizations Pneumococcal Vaccine: Received Pneumococcal Vaccination: Yes Received Pneumococcal Vaccination On or After 60th Birthday: Yes Implantable Devices No devices added Hospitalization / Surgery History Type of Hospitalization/Surgery left carotid endarterectomy left neck hematoma removed left neck coronary angioplasty with stents x2 CABG x3 vessel portacath placement cardiac cath polypectomy Family and Social History Cancer: Yes - Mother; Diabetes: No; Heart Disease: Yes - Father; Hereditary Spherocytosis: No; Hypertension: Yes - Father; Kidney Disease: No; Lung Disease: No; Seizures: No; Stroke: No; Thyroid Problems: No; Tuberculosis: No; Current some day smoker; Marital Status - Married; Alcohol Use: Never; Drug Use: No History; Caffeine Use: Daily; Financial Concerns: No; Food, Clothing or Shelter Needs: No; Support System Lacking: No; Transportation Concerns: No Electronic Signature(s) Signed: 06/07/2023 9:31:19 AM By: Scott Guess MD FACS Signed: 06/07/2023 4:45:32 PM By: Zenaida Deed RN, BSN Entered By: Zenaida Deed on 06/07/2023 05:24:46 -------------------------------------------------------------------------------- SuperBill Details Patient Name: Date of Service: Scott Blakes. 06/07/2023 Medical Record Number: [id number] Patient Account Number: [id number] Date of Birth/Sex: Treating RN: 1955-12-06 (67 y.o. M) Primary Care Provider: [name] Other Clinician: Referring Provider: Treating Provider/Extender: [name], [name] Weeks in Treatment: 0 Diagnosis Coding ICD-10 Codes Code Description [phone] Non-pressure chronic ulcer of other part of left lower leg with fat layer exposed I87.2 Venous insufficiency (chronic) (peripheral) E11.622 Type 2 diabetes mellitus with other skin ulcer I89.0 Lymphedema, not elsewhere classified Facility Procedures COURTNEY, REIFSNYDER ([id number]): CPT4 Code Description [id number] [phone] - WOUND CARE VISIT-LEV 3 EST PT [id number] Page 12 of 12: Modifier Quantity [address] HARREL, PELFREY ([id number]): [id number] [phone] - DEBRIDE WOUND 1ST 20 SQ CM OR < ICD-10 Diagnosis Description L97.822 Non-pressure chronic ulcer of other part of left lower leg with [phone].pdf Page 12 of 12: 1 fat layer exposed Physician Procedures : CPT4 Code Description Modifier [id number] - WC PHYS LEVEL 4 - NEW PT ICD-10 Diagnosis Description L97.822 Non-pressure chronic ulcer of other part of left lower leg with fat layer exposed I87.2 Venous insufficiency (chronic) (peripheral) E11.622  Type 2 diabetes mellitus with other skin ulcer I89.0 Lymphedema, not elsewhere classified Quantity: 1 : [phone] - WC PHYS DEBR WO ANESTH 20 SQ CM ICD-10 Diagnosis Description L97.822

## 2023-06-07 NOTE — Progress Notes (Signed)
diagnosis (Do you have 2 or more medical diagnoseso) 0 No Ambulatory aid None/bed rest/wheelchair/nurse 0 Yes Crutches/cane/walker 0 No Furniture 0 No Intravenous therapy Access/Saline/Heparin Lock 0 No Gait/Transferring Normal/ bed rest/ wheelchair 0 No Weak (short steps with or without shuffle, stooped but able to lift head while walking, may seek 10 Yes support from furniture) Impaired (short steps with shuffle, may have difficulty arising from chair, head down, impaired 0 No balance) Mental Status Oriented to own ability 0 Yes Electronic Signature(s) Signed: 06/07/2023 4:45:32 PM By: Zenaida Deed RN, BSN Entered By: Zenaida Deed on 06/07/2023 05:27:26 -------------------------------------------------------------------------------- Foot Assessment Details Patient Name: Date of Service: [name]. 06/07/2023 8:00 A M Medical Record Number: [id number] Patient Account Number: [id number] Date of Birth/Sex: Treating RN: [birth year] (67 y.o. Damaris Schooner Primary Care Cassandr Cederberg: Tresa Garter Other Clinician: Referring Ryaan Vanwagoner: Treating Evangelia Whitaker/Extender: Laurence Ferrari, ERNEST Weeks in Treatment: 0 Foot Assessment Items Site Locations + = Sensation present, - = Sensation absent, C = Callus, U =  Ulcer R = Redness, W = Warmth, M = Maceration, PU = Pre-ulcerative lesion F = Fissure, S = Swelling, D = Dryness Assessment Right: Left: Other Deformity: No No Prior Foot Ulcer: No No Prior Amputation: No No Charcot Joint: No No Ambulatory Status: Ambulatory Without Help GaitCLARY, [name] ([id number]) [id number] Page 4 of 4 Electronic Signature(s) Signed: 06/07/2023 4:45:32 PM By: Zenaida Deed RN, BSN Entered By: Zenaida Deed on 06/07/2023 06:30:16 -------------------------------------------------------------------------------- Nutrition Risk Screening Details Patient Name: Date of Service: [name]. 06/07/2023 8:00 A M Medical Record Number: [id number] Patient Account Number: [id number] Date of Birth/Sex: Treating RN: 1956-08-06 (67 y.o. Damaris Schooner Primary Care Daci Stubbe: Tresa Garter Other Clinician: Referring Eowyn Tabone: Treating Journei Thomassen/Extender: Laurence Ferrari, ERNEST Weeks in Treatment: 0 Height (in): 72 Weight (lbs): 252 Body Mass Index (BMI): 34.2 Nutrition Risk Screening Items Score Screening NUTRITION RISK SCREEN: I have an illness or condition that made me change the kind and/or amount of food I eat 0 No I eat fewer than two meals per day 0 No I eat few fruits and vegetables, or milk products 0 No I have three or more drinks of beer, liquor or wine almost every day 0 No I have tooth or mouth problems that make it hard for me to eat 2 Yes I don't always have enough money to buy the food I need 0 No I eat alone most of the time 0 No I take three or more different prescribed or over-the-counter drugs a day 1 Yes Without wanting to, I have lost or gained 10 pounds in the last six months 0 No I am not always physically able to shop, cook and/or feed myself 0 No Nutrition Protocols Good Risk Protocol Moderate Risk Protocol 0 Provide education on nutrition High Risk Proctocol Risk  Level: Moderate Risk Score: 3 Electronic Signature(s) Signed: 06/07/2023 4:45:32 PM By: Zenaida Deed RN, BSN Entered By: Zenaida Deed on 06/07/2023 73:22:02  [name] ([id number]) [id number] Page 1 of 4 Visit Report for 06/07/2023 Abuse Risk Screen Details Patient Name: Date of Service: [name]. 06/07/2023 8:00 A M Medical Record Number: [id number] Patient Account Number: [id number] Date of Birth/Sex: Treating RN: 1955/11/21 (67 y.o. Damaris Schooner Primary Care Davidson Palmieri: Tresa Garter Other Clinician: Referring Ashe Graybeal: Treating Luvern Mischke/Extender: Laurence Ferrari, ERNEST Weeks in Treatment: 0 Abuse Risk Screen Items Answer ABUSE RISK SCREEN: Has anyone close to you tried to hurt or harm you recentlyo No Do you feel uncomfortable with anyone in your familyo No Has anyone forced you do things that you didnt want to doo No Electronic Signature(s) Signed: 06/07/2023 4:45:32 PM By: Zenaida Deed RN, BSN Entered By: Zenaida Deed on 06/07/2023 05:25:08 -------------------------------------------------------------------------------- Activities of Daily Living Details Patient Name: Date of Service: [name]. 06/07/2023 8:00 A M Medical Record Number: [id number] Patient Account Number: [id number] Date of Birth/Sex: Treating RN: 27-Feb-1956 (67 y.o. Damaris Schooner Primary Care Joab Carden: Tresa Garter Other Clinician: Referring Nayara Taplin: Treating Pradeep Beaubrun/Extender: Laurence Ferrari, ERNEST Weeks in Treatment: 0 Activities of Daily Living Items Answer Activities of Daily Living (Please select one for each item) Drive Automobile Completely Able T Medications ake Completely Able Use T elephone Completely Able Care for Appearance Completely Able Use T oilet Completely Able Bath / Shower Completely Able Dress Self Completely Able Feed Self Completely Able Walk Need Assistance Get In / Out Bed Completely Able Housework Completely Able Prepare Meals Completely Able Handle Money Completely Able Shop for Self Need Assistance Electronic  Signature(s) Signed: 06/07/2023 4:45:32 PM By: Zenaida Deed RN, BSN Entered By: Zenaida Deed on 06/07/2023 05:25:54 [name] ([id number]) [id number] Page 2 of 4 -------------------------------------------------------------------------------- Education Screening Details Patient Name: Date of Service: [name]. 06/07/2023 8:00 A M Medical Record Number: [id number] Patient Account Number: [id number] Date of Birth/Sex: Treating RN: 03-14-1956 (67 y.o. Damaris Schooner Primary Care Erico Stan: Tresa Garter Other Clinician: Referring Tarius Stangelo: Treating Cianni Manny/Extender: Laurence Ferrari, ERNEST Weeks in Treatment: 0 Primary Learner Assessed: Patient Learning Preferences/Education Level/Primary Language Learning Preference: Explanation, Demonstration, Video, Printed Material Highest Education Level: High School Preferred Language: English Cognitive Barrier Language Barrier: No Translator Needed: No Memory Deficit: No Emotional Barrier: No Cultural/Religious Beliefs Affecting Medical Care: No Physical Barrier Impaired Vision: Yes Glasses Impaired Hearing: No Decreased Hand dexterity: No Knowledge/Comprehension Knowledge Level: High Comprehension Level: High Ability to understand written instructions: High Ability to understand verbal instructions: High Motivation Anxiety Level: Calm Cooperation: Cooperative Education Importance: Acknowledges Need Interest in Health Problems: Asks Questions Perception: Coherent Willingness to Engage in Self-Management High Activities: Readiness to Engage in Self-Management High Activities: Electronic Signature(s) Signed: 06/07/2023 4:45:32 PM By: Zenaida Deed RN, BSN Entered By: Zenaida Deed on 06/07/2023 05:26:34 -------------------------------------------------------------------------------- Fall Risk Assessment Details Patient Name: Date of Service: [name]. 06/07/2023 8:00 A M Medical Record Number: [id number] Patient Account Number: [id number] Date of Birth/Sex: Treating RN: 08-12-1956 (67 y.o. Damaris Schooner Primary Care Tira Lafferty: Tresa Garter Other Clinician: Referring Demesha Boorman: Treating Emilija Bohman/Extender: Laurence Ferrari, ERNEST Weeks in Treatment: 0 Fall Risk Assessment Items Have you had 2 or more falls in the last [address] [name] ([id number]) [phone] [id number] Page 3 of 4 Have you had any fall that resulted in injury in the last 12 monthso 0 No FALLS RISK SCREEN History of falling - immediate or within 3 months 25 Yes Secondary

## 2023-06-11 ENCOUNTER — Ambulatory Visit: Payer: Self-pay | Admitting: Licensed Clinical Social Worker

## 2023-06-11 NOTE — Patient Outreach (Signed)
Care Coordination   Follow Up Visit Note   06/11/2023 Name: Tirek Hagedorn MRN: [id number] DOB: [birth year]  Judithe Modest Burker is a 67 y.o. year old male who sees Plotnikov, Georgina Quint, MD for primary care. I spoke with  [name] by phone today.  What matters to the patients health and wellness today?  Patient said he uses a cane to help him walk. He is at  risk for falls    Goals Addressed             This Visit's Progress    Patient Stated he uses a cane to help him walk. He is at risk for falls       Interventions:  Spoke with client  via phone about client needs. He spoke of edema issues in his legs. He spoke of pain issues in his legs Discussed client uses of diuretic. He said he has 3 medicines that help with fluid issues for client He is attending scheduled medical appointments Discussed program support with RN, LCSW, Pharmacist Discussed family support. He has some support from his wife. Discussed appetite. Discussed sleeping issues Spoke of client support with PCP, Dr. Posey Rea Discussed pain issues faced. He spoke of back pain issues. Discussed ambulation. He said he walks slowly. He said he sometimes gets short of breath in walking. Discussed dizziness. He said sometimes when he stands up he may be dizzy for a bit. He gets fatigued sometimes when walking. He may get short of breath in walking  Discussed health needs of his spouse Encouraged client to use program support services with RN, LCSW, Pharmacist Thanked client for phone call with LCSW Encouraged client to call LCSW as needed for SW support at [phone]          SDOH assessments and interventions completed:  Yes  SDOH Interventions Today    Flowsheet Row Most Recent Value  SDOH Interventions   Depression Interventions/Treatment  Counseling  Physical Activity Interventions Other (Comments)  [edema issues. short of breath in walking]  Stress Interventions Provide Counseling   [stress in managing medical needs]        Care Coordination Interventions:  Yes, provided   Interventions Today    Flowsheet Row Most Recent Value  Chronic Disease   Chronic disease during today's visit Other  [spoke with client about client needs]  General Interventions   General Interventions Discussed/Reviewed General Interventions Discussed, Community Resources  Education Interventions   Education Provided Provided Education  Provided Verbal Education On [employer]  Mental Health Interventions   Mental Health Discussed/Reviewed Coping Strategies  [trying to use coping skills to manage medical needs]  Nutrition Interventions   Nutrition Discussed/Reviewed Nutrition Discussed  Pharmacy Interventions   Pharmacy Dicussed/Reviewed Pharmacy Topics Discussed  Safety Interventions   Safety Discussed/Reviewed Fall Risk       Follow up plan: Follow up call scheduled for 07/17/23 at 10:30 AM    Encounter Outcome:  Patient Visit Completed   Kelton Pillar.Aundrea Higginbotham MSW, LCSW Licensed Visual merchandiser [facility] Care Management [phone]

## 2023-06-11 NOTE — Patient Instructions (Signed)
Visit Information  Thank you for taking time to visit with me today. Please don't hesitate to contact me if I can be of assistance to you.   Following are the goals we discussed today:   Goals Addressed             This Visit's Progress    Patient Stated he uses a cane to help him walk. He is at risk for falls       Interventions:  Spoke with client  via phone about client needs. He spoke of edema issues in his legs. He spoke of pain issues in his legs Discussed client uses of diuretic. He said he has 3 medicines that help with fluid issues for client He is attending scheduled medical appointments Discussed program support with RN, LCSW, Pharmacist Discussed family support. He has some support from his wife. Discussed appetite. Discussed sleeping issues Spoke of client support with PCP, Dr. Posey Rea Discussed pain issues faced. He spoke of back pain issues. Discussed ambulation. He said he walks slowly. He said he sometimes gets short of breath in walking. Discussed dizziness. He said sometimes when he stands up he may be dizzy for a bit. He gets fatigued sometimes when walking. He may get short of breath in walking  Discussed health needs of his spouse Encouraged client to use program support services with RN, LCSW, Pharmacist Thanked client for phone call with LCSW Encouraged client to call LCSW as needed for SW support at [phone]          Our next appointment is by telephone on 07/17/23 at 10:30 AM   Please call the care guide team at [phone] if you need to cancel or reschedule your appointment.   If you are experiencing a Mental Health or Behavioral Health Crisis or need someone to talk to, please go to [facility] [address] [place] [phone])   The patient verbalized understanding of instructions, educational materials, and care plan provided today and DECLINED offer to receive copy of patient instructions,  educational materials, and care plan.   The patient has been provided with contact information for the care management team and has been advised to call with any health related questions or concerns.   Kelton Pillar.Estill Llerena MSW, LCSW Licensed Visual merchandiser [facility] Care Management [phone]

## 2023-06-18 ENCOUNTER — Encounter (HOSPITAL_COMMUNITY): Payer: Self-pay

## 2023-06-18 ENCOUNTER — Encounter (HOSPITAL_BASED_OUTPATIENT_CLINIC_OR_DEPARTMENT_OTHER): Payer: Medicare Other | Attending: General Surgery | Admitting: General Surgery

## 2023-06-18 ENCOUNTER — Inpatient Hospital Stay (HOSPITAL_COMMUNITY)
Admission: EM | Admit: 2023-06-18 | Discharge: 2023-07-15 | DRG: 264 | Disposition: E | Payer: Medicare Other | Source: Ambulatory Visit | Attending: Pulmonary Disease | Admitting: Pulmonary Disease

## 2023-06-18 ENCOUNTER — Other Ambulatory Visit: Payer: Self-pay

## 2023-06-18 ENCOUNTER — Emergency Department (HOSPITAL_COMMUNITY): Payer: Medicare Other

## 2023-06-18 DIAGNOSIS — J9621 Acute and chronic respiratory failure with hypoxia: Secondary | ICD-10-CM | POA: Diagnosis not present

## 2023-06-18 DIAGNOSIS — G253 Myoclonus: Secondary | ICD-10-CM | POA: Diagnosis present

## 2023-06-18 DIAGNOSIS — I509 Heart failure, unspecified: Principal | ICD-10-CM

## 2023-06-18 DIAGNOSIS — N179 Acute kidney failure, unspecified: Secondary | ICD-10-CM | POA: Diagnosis present

## 2023-06-18 DIAGNOSIS — Z6835 Body mass index (BMI) 35.0-35.9, adult: Secondary | ICD-10-CM

## 2023-06-18 DIAGNOSIS — G9341 Metabolic encephalopathy: Secondary | ICD-10-CM | POA: Diagnosis not present

## 2023-06-18 DIAGNOSIS — D75829 Heparin-induced thrombocytopenia, unspecified: Secondary | ICD-10-CM | POA: Diagnosis present

## 2023-06-18 DIAGNOSIS — I4719 Other supraventricular tachycardia: Secondary | ICD-10-CM | POA: Diagnosis present

## 2023-06-18 DIAGNOSIS — E7220 Disorder of urea cycle metabolism, unspecified: Secondary | ICD-10-CM | POA: Diagnosis not present

## 2023-06-18 DIAGNOSIS — R270 Ataxia, unspecified: Secondary | ICD-10-CM | POA: Diagnosis present

## 2023-06-18 DIAGNOSIS — E162 Hypoglycemia, unspecified: Secondary | ICD-10-CM | POA: Diagnosis not present

## 2023-06-18 DIAGNOSIS — E872 Acidosis, unspecified: Secondary | ICD-10-CM

## 2023-06-18 DIAGNOSIS — Z79633 Long term (current) use of mitotic inhibitor: Secondary | ICD-10-CM | POA: Insufficient documentation

## 2023-06-18 DIAGNOSIS — Z8572 Personal history of non-Hodgkin lymphomas: Secondary | ICD-10-CM

## 2023-06-18 DIAGNOSIS — I4892 Unspecified atrial flutter: Secondary | ICD-10-CM | POA: Diagnosis present

## 2023-06-18 DIAGNOSIS — G9340 Encephalopathy, unspecified: Secondary | ICD-10-CM | POA: Diagnosis not present

## 2023-06-18 DIAGNOSIS — E43 Unspecified severe protein-calorie malnutrition: Secondary | ICD-10-CM | POA: Diagnosis present

## 2023-06-18 DIAGNOSIS — J9622 Acute and chronic respiratory failure with hypercapnia: Secondary | ICD-10-CM | POA: Diagnosis not present

## 2023-06-18 DIAGNOSIS — I452 Bifascicular block: Secondary | ICD-10-CM | POA: Diagnosis not present

## 2023-06-18 DIAGNOSIS — K7200 Acute and subacute hepatic failure without coma: Secondary | ICD-10-CM | POA: Diagnosis not present

## 2023-06-18 DIAGNOSIS — Z515 Encounter for palliative care: Secondary | ICD-10-CM

## 2023-06-18 DIAGNOSIS — G4733 Obstructive sleep apnea (adult) (pediatric): Secondary | ICD-10-CM | POA: Diagnosis not present

## 2023-06-18 DIAGNOSIS — G9349 Other encephalopathy: Secondary | ICD-10-CM | POA: Diagnosis not present

## 2023-06-18 DIAGNOSIS — R296 Repeated falls: Secondary | ICD-10-CM | POA: Diagnosis not present

## 2023-06-18 DIAGNOSIS — I10 Essential (primary) hypertension: Secondary | ICD-10-CM | POA: Diagnosis present

## 2023-06-18 DIAGNOSIS — E876 Hypokalemia: Secondary | ICD-10-CM | POA: Diagnosis not present

## 2023-06-18 DIAGNOSIS — R570 Cardiogenic shock: Secondary | ICD-10-CM | POA: Diagnosis not present

## 2023-06-18 DIAGNOSIS — I5084 End stage heart failure: Secondary | ICD-10-CM | POA: Diagnosis present

## 2023-06-18 DIAGNOSIS — J9611 Chronic respiratory failure with hypoxia: Secondary | ICD-10-CM | POA: Insufficient documentation

## 2023-06-18 DIAGNOSIS — I214 Non-ST elevation (NSTEMI) myocardial infarction: Secondary | ICD-10-CM | POA: Diagnosis not present

## 2023-06-18 DIAGNOSIS — D509 Iron deficiency anemia, unspecified: Secondary | ICD-10-CM | POA: Diagnosis present

## 2023-06-18 DIAGNOSIS — Z91199 Patient's noncompliance with other medical treatment and regimen due to unspecified reason: Secondary | ICD-10-CM

## 2023-06-18 DIAGNOSIS — Z7189 Other specified counseling: Secondary | ICD-10-CM | POA: Diagnosis not present

## 2023-06-18 DIAGNOSIS — I483 Typical atrial flutter: Secondary | ICD-10-CM | POA: Diagnosis not present

## 2023-06-18 DIAGNOSIS — J9601 Acute respiratory failure with hypoxia: Secondary | ICD-10-CM | POA: Diagnosis not present

## 2023-06-18 DIAGNOSIS — A419 Sepsis, unspecified organism: Secondary | ICD-10-CM | POA: Diagnosis not present

## 2023-06-18 DIAGNOSIS — Z79620 Long term (current) use of immunosuppressive biologic: Secondary | ICD-10-CM | POA: Insufficient documentation

## 2023-06-18 DIAGNOSIS — Z66 Do not resuscitate: Secondary | ICD-10-CM | POA: Diagnosis not present

## 2023-06-18 DIAGNOSIS — K219 Gastro-esophageal reflux disease without esophagitis: Secondary | ICD-10-CM | POA: Diagnosis present

## 2023-06-18 DIAGNOSIS — D696 Thrombocytopenia, unspecified: Secondary | ICD-10-CM | POA: Diagnosis not present

## 2023-06-18 DIAGNOSIS — T82218A Other mechanical complication of coronary artery bypass graft, initial encounter: Secondary | ICD-10-CM | POA: Diagnosis present

## 2023-06-18 DIAGNOSIS — I50810 Right heart failure, unspecified: Secondary | ICD-10-CM | POA: Diagnosis not present

## 2023-06-18 DIAGNOSIS — Z85828 Personal history of other malignant neoplasm of skin: Secondary | ICD-10-CM

## 2023-06-18 DIAGNOSIS — Z87891 Personal history of nicotine dependence: Secondary | ICD-10-CM

## 2023-06-18 DIAGNOSIS — I252 Old myocardial infarction: Secondary | ICD-10-CM

## 2023-06-18 DIAGNOSIS — I2721 Secondary pulmonary arterial hypertension: Secondary | ICD-10-CM | POA: Diagnosis present

## 2023-06-18 DIAGNOSIS — E662 Morbid (severe) obesity with alveolar hypoventilation: Secondary | ICD-10-CM | POA: Diagnosis present

## 2023-06-18 DIAGNOSIS — E1151 Type 2 diabetes mellitus with diabetic peripheral angiopathy without gangrene: Secondary | ICD-10-CM | POA: Diagnosis present

## 2023-06-18 DIAGNOSIS — N1831 Chronic kidney disease, stage 3a: Secondary | ICD-10-CM | POA: Diagnosis present

## 2023-06-18 DIAGNOSIS — E785 Hyperlipidemia, unspecified: Secondary | ICD-10-CM | POA: Diagnosis present

## 2023-06-18 DIAGNOSIS — R238 Other skin changes: Secondary | ICD-10-CM | POA: Diagnosis present

## 2023-06-18 DIAGNOSIS — L89326 Pressure-induced deep tissue damage of left buttock: Secondary | ICD-10-CM | POA: Diagnosis not present

## 2023-06-18 DIAGNOSIS — E874 Mixed disorder of acid-base balance: Secondary | ICD-10-CM | POA: Diagnosis not present

## 2023-06-18 DIAGNOSIS — Z91048 Other nonmedicinal substance allergy status: Secondary | ICD-10-CM

## 2023-06-18 DIAGNOSIS — Z7982 Long term (current) use of aspirin: Secondary | ICD-10-CM

## 2023-06-18 DIAGNOSIS — I5082 Biventricular heart failure: Secondary | ICD-10-CM | POA: Diagnosis present

## 2023-06-18 DIAGNOSIS — I890 Lymphedema, not elsewhere classified: Secondary | ICD-10-CM | POA: Insufficient documentation

## 2023-06-18 DIAGNOSIS — E8809 Other disorders of plasma-protein metabolism, not elsewhere classified: Secondary | ICD-10-CM | POA: Diagnosis not present

## 2023-06-18 DIAGNOSIS — J61 Pneumoconiosis due to asbestos and other mineral fibers: Secondary | ICD-10-CM | POA: Diagnosis present

## 2023-06-18 DIAGNOSIS — I2582 Chronic total occlusion of coronary artery: Secondary | ICD-10-CM | POA: Diagnosis present

## 2023-06-18 DIAGNOSIS — I081 Rheumatic disorders of both mitral and tricuspid valves: Secondary | ICD-10-CM | POA: Diagnosis present

## 2023-06-18 DIAGNOSIS — J439 Emphysema, unspecified: Secondary | ICD-10-CM | POA: Diagnosis present

## 2023-06-18 DIAGNOSIS — E669 Obesity, unspecified: Secondary | ICD-10-CM | POA: Diagnosis present

## 2023-06-18 DIAGNOSIS — Z7902 Long term (current) use of antithrombotics/antiplatelets: Secondary | ICD-10-CM

## 2023-06-18 DIAGNOSIS — Z7952 Long term (current) use of systemic steroids: Secondary | ICD-10-CM | POA: Insufficient documentation

## 2023-06-18 DIAGNOSIS — R6521 Severe sepsis with septic shock: Secondary | ICD-10-CM | POA: Diagnosis not present

## 2023-06-18 DIAGNOSIS — E1122 Type 2 diabetes mellitus with diabetic chronic kidney disease: Secondary | ICD-10-CM | POA: Diagnosis present

## 2023-06-18 DIAGNOSIS — Z955 Presence of coronary angioplasty implant and graft: Secondary | ICD-10-CM

## 2023-06-18 DIAGNOSIS — I5033 Acute on chronic diastolic (congestive) heart failure: Secondary | ICD-10-CM | POA: Diagnosis present

## 2023-06-18 DIAGNOSIS — N189 Chronic kidney disease, unspecified: Secondary | ICD-10-CM | POA: Diagnosis not present

## 2023-06-18 DIAGNOSIS — E11622 Type 2 diabetes mellitus with other skin ulcer: Secondary | ICD-10-CM | POA: Insufficient documentation

## 2023-06-18 DIAGNOSIS — N5089 Other specified disorders of the male genital organs: Secondary | ICD-10-CM | POA: Diagnosis not present

## 2023-06-18 DIAGNOSIS — I4430 Unspecified atrioventricular block: Secondary | ICD-10-CM | POA: Diagnosis present

## 2023-06-18 DIAGNOSIS — Z91148 Patient's other noncompliance with medication regimen for other reason: Secondary | ICD-10-CM

## 2023-06-18 DIAGNOSIS — Z8249 Family history of ischemic heart disease and other diseases of the circulatory system: Secondary | ICD-10-CM

## 2023-06-18 DIAGNOSIS — Y832 Surgical operation with anastomosis, bypass or graft as the cause of abnormal reaction of the patient, or of later complication, without mention of misadventure at the time of the procedure: Secondary | ICD-10-CM | POA: Diagnosis present

## 2023-06-18 DIAGNOSIS — F17200 Nicotine dependence, unspecified, uncomplicated: Secondary | ICD-10-CM | POA: Insufficient documentation

## 2023-06-18 DIAGNOSIS — Z888 Allergy status to other drugs, medicaments and biological substances status: Secondary | ICD-10-CM

## 2023-06-18 DIAGNOSIS — M6284 Sarcopenia: Secondary | ICD-10-CM | POA: Diagnosis present

## 2023-06-18 DIAGNOSIS — C8291 Follicular lymphoma, unspecified, lymph nodes of head, face, and neck: Secondary | ICD-10-CM | POA: Insufficient documentation

## 2023-06-18 DIAGNOSIS — L97822 Non-pressure chronic ulcer of other part of left lower leg with fat layer exposed: Secondary | ICD-10-CM | POA: Insufficient documentation

## 2023-06-18 DIAGNOSIS — J9612 Chronic respiratory failure with hypercapnia: Secondary | ICD-10-CM | POA: Diagnosis not present

## 2023-06-18 DIAGNOSIS — K761 Chronic passive congestion of liver: Secondary | ICD-10-CM | POA: Diagnosis present

## 2023-06-18 DIAGNOSIS — Z9861 Coronary angioplasty status: Secondary | ICD-10-CM

## 2023-06-18 DIAGNOSIS — I110 Hypertensive heart disease with heart failure: Secondary | ICD-10-CM | POA: Insufficient documentation

## 2023-06-18 DIAGNOSIS — I2723 Pulmonary hypertension due to lung diseases and hypoxia: Secondary | ICD-10-CM | POA: Diagnosis present

## 2023-06-18 DIAGNOSIS — T501X6A Underdosing of loop [high-ceiling] diuretics, initial encounter: Secondary | ICD-10-CM | POA: Diagnosis present

## 2023-06-18 DIAGNOSIS — I2781 Cor pulmonale (chronic): Secondary | ICD-10-CM | POA: Diagnosis not present

## 2023-06-18 DIAGNOSIS — I130 Hypertensive heart and chronic kidney disease with heart failure and stage 1 through stage 4 chronic kidney disease, or unspecified chronic kidney disease: Principal | ICD-10-CM | POA: Diagnosis present

## 2023-06-18 DIAGNOSIS — I25810 Atherosclerosis of coronary artery bypass graft(s) without angina pectoris: Secondary | ICD-10-CM | POA: Diagnosis present

## 2023-06-18 DIAGNOSIS — I872 Venous insufficiency (chronic) (peripheral): Secondary | ICD-10-CM | POA: Diagnosis present

## 2023-06-18 DIAGNOSIS — L89316 Pressure-induced deep tissue damage of right buttock: Secondary | ICD-10-CM | POA: Diagnosis not present

## 2023-06-18 DIAGNOSIS — I2510 Atherosclerotic heart disease of native coronary artery without angina pectoris: Secondary | ICD-10-CM | POA: Diagnosis present

## 2023-06-18 DIAGNOSIS — S81809D Unspecified open wound, unspecified lower leg, subsequent encounter: Secondary | ICD-10-CM

## 2023-06-18 DIAGNOSIS — J449 Chronic obstructive pulmonary disease, unspecified: Secondary | ICD-10-CM | POA: Diagnosis not present

## 2023-06-18 DIAGNOSIS — Z79630 Long term (current) use of alkylating agent: Secondary | ICD-10-CM | POA: Insufficient documentation

## 2023-06-18 DIAGNOSIS — R601 Generalized edema: Secondary | ICD-10-CM

## 2023-06-18 DIAGNOSIS — I4819 Other persistent atrial fibrillation: Secondary | ICD-10-CM | POA: Diagnosis present

## 2023-06-18 DIAGNOSIS — E1165 Type 2 diabetes mellitus with hyperglycemia: Secondary | ICD-10-CM | POA: Diagnosis not present

## 2023-06-18 DIAGNOSIS — I5043 Acute on chronic combined systolic (congestive) and diastolic (congestive) heart failure: Secondary | ICD-10-CM | POA: Diagnosis not present

## 2023-06-18 DIAGNOSIS — I2489 Other forms of acute ischemic heart disease: Secondary | ICD-10-CM | POA: Diagnosis not present

## 2023-06-18 DIAGNOSIS — Z79899 Other long term (current) drug therapy: Secondary | ICD-10-CM

## 2023-06-18 DIAGNOSIS — L97812 Non-pressure chronic ulcer of other part of right lower leg with fat layer exposed: Secondary | ICD-10-CM | POA: Insufficient documentation

## 2023-06-18 DIAGNOSIS — C829A Follicular lymphoma, unspecified, in remission: Secondary | ICD-10-CM | POA: Diagnosis present

## 2023-06-18 DIAGNOSIS — Z9981 Dependence on supplemental oxygen: Secondary | ICD-10-CM

## 2023-06-18 DIAGNOSIS — E873 Alkalosis: Secondary | ICD-10-CM | POA: Diagnosis not present

## 2023-06-18 DIAGNOSIS — E11649 Type 2 diabetes mellitus with hypoglycemia without coma: Secondary | ICD-10-CM | POA: Diagnosis not present

## 2023-06-18 HISTORY — DX: Chronic kidney disease, stage 3 unspecified: N18.30

## 2023-06-18 HISTORY — DX: Anemia, unspecified: D64.9

## 2023-06-18 HISTORY — DX: Chronic diastolic (congestive) heart failure: I50.32

## 2023-06-18 LAB — CBC
HCT: 31.3 % — ABNORMAL LOW (ref 39.0–52.0)
Hemoglobin: 9.2 g/dL — ABNORMAL LOW (ref 13.0–17.0)
MCH: 28.5 pg (ref 26.0–34.0)
MCHC: 29.4 g/dL — ABNORMAL LOW (ref 30.0–36.0)
MCV: 96.9 fL (ref 80.0–100.0)
Platelets: 136 10*3/uL — ABNORMAL LOW (ref 150–400)
RBC: 3.23 MIL/uL — ABNORMAL LOW (ref 4.22–5.81)
RDW: 18.1 % — ABNORMAL HIGH (ref 11.5–15.5)
WBC: 7.6 10*3/uL (ref 4.0–10.5)
nRBC: 0 % (ref 0.0–0.2)

## 2023-06-18 LAB — BASIC METABOLIC PANEL
Anion gap: 13 (ref 5–15)
BUN: 42 mg/dL — ABNORMAL HIGH (ref 8–23)
CO2: 40 mmol/L — ABNORMAL HIGH (ref 22–32)
Calcium: 8.8 mg/dL — ABNORMAL LOW (ref 8.9–10.3)
Chloride: 84 mmol/L — ABNORMAL LOW (ref 98–111)
Creatinine, Ser: 1.96 mg/dL — ABNORMAL HIGH (ref 0.61–1.24)
GFR, Estimated: 36 mL/min — ABNORMAL LOW (ref >60)
Glucose, Bld: 91 mg/dL (ref 70–99)
Potassium: 4.2 mmol/L (ref 3.5–5.1)
Sodium: 137 mmol/L (ref 135–145)

## 2023-06-18 LAB — HEPATIC FUNCTION PANEL
ALT: 15 U/L (ref 0–44)
AST: 26 U/L (ref 15–41)
Albumin: 3.5 g/dL (ref 3.5–5.0)
Alkaline Phosphatase: 81 U/L (ref 38–126)
Bilirubin, Direct: 0.4 mg/dL — ABNORMAL HIGH (ref 0.0–0.2)
Indirect Bilirubin: 0.7 mg/dL (ref 0.3–0.9)
Total Bilirubin: 1.1 mg/dL (ref <1.2)
Total Protein: 6.8 g/dL (ref 6.5–8.1)

## 2023-06-18 LAB — TROPONIN I (HIGH SENSITIVITY)
Troponin I (High Sensitivity): 97 ng/L — ABNORMAL HIGH (ref <18)
Troponin I (High Sensitivity): 88 ng/L — ABNORMAL HIGH (ref <18)

## 2023-06-18 LAB — HIV ANTIBODY (ROUTINE TESTING W REFLEX): HIV Screen 4th Generation wRfx: NONREACTIVE

## 2023-06-18 LAB — BRAIN NATRIURETIC PEPTIDE: B Natriuretic Peptide: 2104.7 pg/mL — ABNORMAL HIGH (ref 0.0–100.0)

## 2023-06-18 MED ORDER — ACETAMINOPHEN 325 MG PO TABS: 650.0000 mg | ORAL_TABLET | ORAL | Status: DC | PRN

## 2023-06-18 MED ORDER — OXYCODONE-ACETAMINOPHEN 7.5-325 MG PO TABS
1.0000 | ORAL_TABLET | Freq: Four times a day (QID) | ORAL | Status: DC | PRN
  Administered 2023-06-18 – 2023-06-22 (×6): 1 via ORAL
  Filled 2023-06-18 (×6): qty 1

## 2023-06-18 MED ORDER — ASPIRIN 81 MG PO TBEC: 81.0000 mg | DELAYED_RELEASE_TABLET | Freq: Every evening | ORAL | Status: DC

## 2023-06-18 MED ORDER — CLOPIDOGREL BISULFATE 75 MG PO TABS
75.0000 mg | ORAL_TABLET | Freq: Every day | ORAL | Status: DC
  Filled 2023-06-18: qty 1

## 2023-06-18 MED ORDER — ONDANSETRON HCL 4 MG/2ML IJ SOLN
4.0000 mg | Freq: Four times a day (QID) | INTRAMUSCULAR | Status: DC | PRN
Start: 2023-06-18 — End: 2023-07-01

## 2023-06-18 MED ORDER — FUROSEMIDE 10 MG/ML IJ SOLN
80.0000 mg | Freq: Two times a day (BID) | INTRAMUSCULAR | Status: DC
  Administered 2023-06-18 – 2023-06-23 (×11): 80 mg via INTRAVENOUS
  Filled 2023-06-18 (×11): qty 8

## 2023-06-18 MED ORDER — METOPROLOL SUCCINATE ER 25 MG PO TB24
12.5000 mg | ORAL_TABLET | Freq: Every day | ORAL | Status: DC
  Administered 2023-06-18 – 2023-06-23 (×6): 12.5 mg via ORAL
  Filled 2023-06-18 (×6): qty 1

## 2023-06-18 MED ORDER — SODIUM CHLORIDE 0.9 % IV SOLN: 250.0000 mL | INTRAVENOUS | Status: AC | PRN

## 2023-06-18 MED ORDER — SIMVASTATIN 20 MG PO TABS
20.0000 mg | ORAL_TABLET | Freq: Every day | ORAL | Status: DC
  Administered 2023-06-18 – 2023-06-23 (×6): 20 mg via ORAL
  Filled 2023-06-18 (×6): qty 1

## 2023-06-18 MED ORDER — CLONAZEPAM 0.5 MG PO TABS
1.0000 mg | ORAL_TABLET | Freq: Every day | ORAL | Status: DC
  Administered 2023-06-18 – 2023-06-22 (×5): 1 mg via ORAL
  Filled 2023-06-18 (×5): qty 2

## 2023-06-18 MED ORDER — SODIUM CHLORIDE 0.9% FLUSH
3.0000 mL | INTRAVENOUS | Status: DC | PRN
Start: 2023-06-18 — End: 2023-07-01

## 2023-06-18 MED ORDER — FUROSEMIDE 10 MG/ML IJ SOLN: 40.0000 mg | Freq: Two times a day (BID) | INTRAMUSCULAR | Status: DC

## 2023-06-18 MED ORDER — SODIUM CHLORIDE 0.9% FLUSH
3.0000 mL | Freq: Two times a day (BID) | INTRAVENOUS | Status: DC
Start: 2023-06-18 — End: 2023-07-01
  Administered 2023-06-18 – 2023-07-01 (×24): 3 mL via INTRAVENOUS

## 2023-06-18 MED ORDER — SENNOSIDES-DOCUSATE SODIUM 8.6-50 MG PO TABS
1.0000 | ORAL_TABLET | Freq: Every evening | ORAL | Status: DC | PRN
  Administered 2023-06-22: 2 via ORAL
  Filled 2023-06-18: qty 2

## 2023-06-18 MED ORDER — FUROSEMIDE 10 MG/ML IJ SOLN
80.0000 mg | Freq: Once | INTRAMUSCULAR | Status: AC
  Administered 2023-06-18: 80 mg via INTRAVENOUS
  Filled 2023-06-18: qty 8

## 2023-06-18 NOTE — ED Provider Triage Note (Signed)
Emergency Medicine Provider Triage Evaluation Note  [name] , a 67 y.o. male  was evaluated in triage.  Pt complains of shortness of breath/swelling.  Review of Systems  Positive: Anasarca, shortness of breath Negative: Fever, chills, nausea, vomiting, diarrhea  Physical Exam  BP 122/67 (BP Location: Right Arm)   Pulse 72   Temp 97.9 F (36.6 C) (Oral)   Resp 18   Ht 6' (1.829 m)   Wt 118.4 kg   SpO2 (!) 80%   BMI 35.40 kg/m  Gen:   Awake, no distress   Resp:  Increased effort  MSK:   Moves extremities without difficulty, edema of bilateral hands, bilateral legs and abdomen Other:    Medical Decision Making  Medically screening exam initiated at 12:34 PM.  Appropriate orders placed.  [name] was informed that the remainder of the evaluation will be completed by another provider, this initial triage assessment does not replace that evaluation, and the importance of remaining in the ED until their evaluation is complete.  Patient's wife states that he did not take his torsemide since they would be out running errands today.  Patient states that he is retaining a lot more fluid than normal and he believes this is what is causing the shortness of breath.  Patient's SpO2 at 98% on 3 L nasal cannula.   Dolphus Jenny, PA-C 06/18/23 1237

## 2023-06-18 NOTE — Progress Notes (Signed)
Informed Dr Rennis Golden of ST ev elation in lead 2 alarm ; obtaining EKG

## 2023-06-18 NOTE — H&P (Signed)
History and Physical    Patient: [name] LKG:[id number] DOB: 08/17/1955 DOA: 06/18/2023 DOS: the patient was seen and examined on 06/18/2023 PCP: Plotnikov, Georgina Quint, MD  Patient coming from: From wound c care:  Chief Complaint:  Chief Complaint  Patient presents with   Leg Swelling   Groin Swelling   HPI: [name] is a 67 y.o. male with medical history significant of hypertension, diastolic CHF, CAD, COPD, follicular lymphoma who presents with complaints of swelling.  Patient reports having progressively worsening swelling over the last couple weeks.  States he is having swelling in both legs all the way up into his abdomen.  Due to the swelling in his legs he has developed blisters that  pop and weep clear fluid.  He had gone to wound care center today and it was reported the patient was taking torsemide 20 mg 3 times a week and not on a daily basis as it makes him urinate all day.  Wife states he is taking his medications as prescribed when asked about compliance with medications.  She also reports that 3 days ago the patient was holding onto the truck and the car door.  He wet himself and seemed like he could pass out was shaking all over.  She was able to get up into the passenger side of the car and he recovered quickly.  Patient also reportedly had fallen twice here recently.  He reports getting dizzy whenever changing positions and appears to be taking metoprolol succinate 12.5 twice daily.  He attributes this to recent changes in his blood pressure medicines.  Wife makes note that he is asked multiple appointments coming up soon including oncology, pulmonology, and his primary care provider.  He gone to the wound care center today and had his legs wrapped.  While there patient was noted to have   In the emergency department patient was noted to be afebrile with respirations 18-26, blood pressures elevated up to 143/66, and oxygen saturations noted to be as low as 80%  with improvement on 2 L of nasal cannula oxygen.  Labs significant for hemoglobin 9.2, platelets 136, CO2 40, BUN 42, creatinine 1.96, BNP 2104.7, and high-sensitivity troponins 97->88.  Chest x-ray was pending.  Patient was given Lasix 80 mg IV.   Review of Systems: As mentioned in the history of present illness. All other systems reviewed and are negative. Past Medical History:  Diagnosis Date   Anginal pain (HCC)    not had to use in awhile  none in 10 years   Anxiety    Basal cell carcinoma of nose    Blood dyscrasia    trouble clotting    Carotid artery disease (HCC)    s/p L CEA 2009 (hx of evacuation of hematoma due to heparin)   CHF (congestive heart failure) (HCC)    Chronic lower back pain    COPD (chronic obstructive pulmonary disease) (HCC)    Coronary artery disease    CABG 2005. s/p PTCA and stenting of the saphenous vein graft to PDA and saphenous vein graft to obtuse marginal by Dr. Excell Seltzer 09/29/11. Normal EF at cath 09/2011   Depression    Diabetes mellitus without complication (HCC)    borderline    Dysrhythmia    fluttering   Edema    GERD (gastroesophageal reflux disease)    Heart murmur    Heparin induced thrombocytopenia (HCC)    History of home oxygen therapy    2 liters at  night prn   Hyperlipidemia    Hypertension    Lymphoma (HCC) 12/21/2014   Myocardial infarction (HCC)    Obesity    Shortness of breath dyspnea    with exertion    Sleep apnea    "used to"      could not use 2006   Past Surgical History:  Procedure Laterality Date   BASAL CELL CARCINOMA EXCISION  2000's   nose   BIOPSY  08/22/2022   Procedure: BIOPSY;  Surgeon: Jeani Hawking, MD;  Location: [facility] ENDOSCOPY;  Service: Gastroenterology;;   BRONCHIAL NEEDLE ASPIRATION BIOPSY  07/24/2022   Procedure: BRONCHIAL NEEDLE ASPIRATION BIOPSIES;  Surgeon: Leslye Peer, MD;  Location[facility] ENDOSCOPY;  Service: Cardiopulmonary;;   CARDIAC CATHETERIZATION  12/07/2017   CAROTID ENDARTERECTOMY   03/2008   left   COLONOSCOPY WITH PROPOFOL N/A 08/22/2022   Procedure: COLONOSCOPY WITH PROPOFOL;  Surgeon: Jeani Hawking, MD;  Location: [facility] ENDOSCOPY;  Service: Gastroenterology;  Laterality: N/A;   CORONARY ANGIOPLASTY WITH STENT PLACEMENT  09/29/11   "2"   CORONARY ARTERY BYPASS GRAFT  2005   CABG X3   CORONARY STENT INTERVENTION  12/07/2017   CORONARY STENT INTERVENTION N/A 12/07/2017   Procedure: CORONARY STENT INTERVENTION;  Surgeon: Kathleene Hazel, MD;  Location: [facility] INVASIVE CV LAB;  Service: Cardiovascular;  Laterality: N/A;   ESOPHAGOGASTRODUODENOSCOPY (EGD) WITH PROPOFOL N/A 08/22/2022   Procedure: ESOPHAGOGASTRODUODENOSCOPY (EGD) WITH PROPOFOL;  Surgeon: Jeani Hawking, MD;  Location: [facility] ENDOSCOPY;  Service: Gastroenterology;  Laterality: N/A;   evacuation of hematoma  03/2008   left neck; S/P endarterectomy; "cause heparin clotted it up"   I & D EXTREMITY Left 02/11/2015   Procedure: IRRIGATION AND DEBRIDEMENT LEFT LONG FINGER;  Surgeon: Betha Loa, MD;  Location: [facility];  Service: Orthopedics;  Laterality: Left;  I and D Left Long Finger   IR REMOVAL TUN ACCESS W/ PORT W/O FL MOD SED  05/18/2017   MASS EXCISION Left 12/11/2014   Procedure: EXCISIONAL BIOPSY OF LEFT SUPRA CLAVICULAR NECK MASS;  Surgeon: Osborn Coho, MD;  Location: [facility] OR;  Service: ENT;  Laterality: Left;   PERCUTANEOUS CORONARY STENT INTERVENTION (PCI-S) N/A 09/29/2011   Procedure: PERCUTANEOUS CORONARY STENT INTERVENTION (PCI-S);  Surgeon: Tonny Bollman, MD;  Location: [facility];  Service: Cardiovascular;  Laterality: N/A;   POLYPECTOMY  08/22/2022   Procedure: POLYPECTOMY;  Surgeon: Jeani Hawking, MD;  Location: [facility] ENDOSCOPY;  Service: Gastroenterology;;   Tod [place] PLACEMENT  2016   RIGHT/LEFT HEART CATH AND CORONARY/GRAFT ANGIOGRAPHY N/A 12/07/2017   Procedure: RIGHT/LEFT HEART CATH AND CORONARY/GRAFT ANGIOGRAPHY;  Surgeon: Kathleene Hazel, MD;  Location: [facility]AS[facility];   Service: Cardiovascular;  Laterality: N/A;   SUPERFICIAL LYMPH NODE BIOPSY / EXCISION Left    VIDEO BRONCHOSCOPY WITH ENDOBRONCHIAL ULTRASOUND N/A 07/24/2022   Procedure: VIDEO BRONCHOSCOPY WITH ENDOBRONCHIAL ULTRASOUND;  Surgeon: Leslye Peer, MD;  Location: [facility] ENDOSCOPY;  Service: Cardiopulmonary;  Laterality: N/A;   Social History:  reports that he quit smoking about 5 years ago. His smoking use included cigarettes. He started smoking about 46 years ago. He has a 82 pack-year smoking history. He has quit using smokeless tobacco.  His smokeless tobacco use included chew. He reports that he does not currently use drugs after having used the following drugs: Marijuana. He reports that he does not drink alcohol.  Allergies  Allergen Reactions   Bendamustine Hcl     See hospital notes from 01/12/15, legs turned black   Heparin  Other (See Comments)    Heparin induced thrombocytopenia    Bendamustine Hcl Rash    Severe   Tape Rash    If left on for prolong periods of time     Family History  Problem Relation Age of Onset   Heart disease Father    Bone cancer Mother     Prior to Admission medications   Medication Sig Start Date End Date Taking? Authorizing Provider  albuterol (PROVENTIL) (2.5 MG/3ML) 0.083% nebulizer solution INHALE 1 VIAL VIA NEBULIZER EVERY DAY AS NEEDED FOR WHEEZING/SHORTNESS OF BREATH 12/28/22  Yes Plotnikov, Georgina Quint, MD  aspirin EC 81 MG tablet Take 81 mg by mouth every evening.    Yes [provider]  aspirin-sod bicarb-citric acid (ALKA-SELTZER) 325 MG TBEF tablet Take 650 mg by mouth every 6 (six) hours as needed (indigestion).   Yes [provider]  clonazePAM (KLONOPIN) 1 MG tablet Take 1 tablet (1 mg total) by mouth at bedtime. 02/21/23  Yes Plotnikov, Georgina Quint, MD  clopidogrel (PLAVIX) 75 MG tablet TAKE 1 TABLET BY MOUTH EVERY DAY WITH BREAKFAST 05/22/23  Yes Plotnikov, Georgina Quint, MD  metolazone (ZAROXOLYN) 5 MG tablet Take 1 tablet (5 mg  total) by mouth daily as needed (swelling). 02/07/23  Yes Plotnikov, Georgina Quint, MD  metoprolol succinate (TOPROL-XL) 25 MG 24 hr tablet Take 0.5 tablets (12.5 mg total) by mouth daily. Take with or immediately following a meal. 05/24/23  Yes Plotnikov, Georgina Quint, MD  nitroGLYCERIN (NITROSTAT) 0.4 MG SL tablet Place 1 tablet (0.4 mg total) under the tongue every 5 (five) minutes as needed for chest pain (Up to 3 doses). 01/21/16 06/18/23 Yes Wendall Stade, MD  oxyCODONE-acetaminophen (PERCOCET) 7.5-325 MG tablet Take 1 tablet by mouth every 6 (six) hours as needed for severe pain. 02/21/23  Yes Plotnikov, Georgina Quint, MD  oxymetazoline (AFRIN) 0.05 % nasal spray Place 1 spray into both nostrils 2 (two) times daily as needed for congestion.   Yes [provider]  simvastatin (ZOCOR) 20 MG tablet Take 1 tablet (20 mg total) by mouth at bedtime. 02/12/23  Yes Plotnikov, Georgina Quint, MD  torsemide (DEMADEX) 20 MG tablet Take 20 mg by mouth daily.   Yes [provider]  senna-docusate (SENOKOT-S) 8.6-50 MG tablet Take 1-2 tablets by mouth at bedtime. Patient not taking: Reported on 06/18/2023 11/22/22 11/22/23  Tresa Garter, MD    Physical Exam: Vitals:   06/18/23 1156 06/18/23 1245 06/18/23 1256 06/18/23 1330  BP:  (!) 143/66 (!) 143/66 (!) 149/125  Pulse:  88 73 72  Resp:  (!) 26 20 (!) 25  Temp:      TempSrc:      SpO2:  98% 99% 100%  Weight: 118.4 kg     Height: 6' (1.829 m)      Constitutional: Elderly male sitting on the side of the hospital chronic Eyes: PERRL, lids and conjunctivae normal ENMT: Mucous membranes are moist.  Fair dentition Neck: normal, supple Respiratory: Decreased overall aeration with crackles heard in both lung fields.  Patient on 2 L nasal cannula oxygen currently. Cardiovascular: Regular rate and rhythm, no murmurs / rubs / gallops.  At least 2+ pitting bilateral lower extremity edema.   Abdomen: no tenderness, no masses palpated. No  hepatosplenomegaly. Bowel sounds positive.  Musculoskeletal: no clubbing / cyanosis. No joint deformity upper and lower extremities. Good ROM, no contractures. Normal muscle tone.  Skin: Legs currently wrapped. Neurologic: CN 2-12 grossly intact. Sensation intact, DTR  normal. Strength 5/5 in all 4.  Psychiatric: Normal judgment and insight. Alert and oriented x 3. Normal mood.   Data Reviewed:  Reviewed labs, imaging, and pertinent records as documented. Assessment and Plan:  Diastolic congestive heart failure Acute on chronic.  Patient presents with complaints of progressive swelling of his lower extremities up his body.  After being noted to have low oxygen sats at the wound care center.  BNP was elevated 2104.7.  Patient had Doppler ultrasound of the lower extremities performed on 05/2023 which was negative for blood clots in both legs. Last echocardiogram noted EF to be 60 to 65% with grade 2 diastolic dysfunction back in 04/2022.  Unclear compliance with torsemide.   Patient had been given Lasix 80 mg IV x 1 dose. -Admit to a cardiac telemetry bed -Elevate legs -Strict I&Os and daily weights -Check echocardiogram -Lasix 40 mg IV twice daily.  Adjust Lasix regimen as needed. -Continue beta-blocker -Cardiology consulted we will follow-up for any further recommendations  Chronic respiratory failure with hypoxia COPD, without acute exacerbation At baseline patient is supposed to be on 2 L of oxygen, but had initially reported to have low O2 saturations on home O2.  O2 saturations appear currently maintained on 2 L of oxygen.  Patient followed by Dr. Delton Coombes of pulmonology in outpatient setting -Continuous pulse oximetry with nasal cannula oxygen to maintain O2 saturations. -Albuterol nebs as needed for shortness of breath/wheezing  Essential hypertension Dizziness On admission blood pressure seem to range from 118/50-149/125. Patient reportedly been taking metoprolol succinate 12.5 mg  twice daily, but reports having had dizziness and possible syncopal episodes as relates to his falls.  Office note this from September and back in February had noted patient being recommended to be on amlodipine 5 mg daily as well. -Check orthostatic vital signs -Continue metoprolol succinate 12.5 mg -Will defer to cardiology for clarification on blood pressure regimen  Falls at home Prior to arrival.  Patient reportedly had 2 recent falls and what sounds like a possible near syncopal episode. -PT to eval and treat  CAD Patient status post CABG x 3 in 2005 with PCI/DES to SVG to RCA in 2019 and previous PCI to SVG-Ramus and DES to SVG-PDA. -Continue aspirin, Plavix, Zocor   History of follicular lymphoma Patient appears to be in remission.  Last underwent video bronchoscopy for biopsy of mediastinal adenopathy back on 07/2022 which noted no signs of reoccurrence at that time. -Continue outpatient follow-up with oncology/pulmonology  Lower extremity wounds and edema Patient has bilateral lower extremity wounds currently wrapped after being seen by wound care.   -Continue outpatient follow-up with wound care  Hyperlipidemia Last LDL was 48. -Continue Zocor  Thrombocytopenia Chronic.  Platelet count 136 -Continue to monitor  Obesity  BMI 35.4 kg/m DVT prophylaxis: SCDs  Advance Care Planning:   Code Status: Full Code    Consults: Cardiology  Family Communication: Wife updated at bedside  Severity of Illness: The appropriate patient status for this patient is INPATIENT. Inpatient status is judged to be reasonable and necessary in order to provide the required intensity of service to ensure the patient's safety. The patient's presenting symptoms, physical exam findings, and initial radiographic and laboratory data in the context of their chronic comorbidities is felt to place them at high risk for further clinical deterioration. Furthermore, it is not anticipated that the patient  will be medically stable for discharge from the hospital within 2 midnights of admission.   * I certify that at  the point of admission it is my clinical judgment that the patient will require inpatient hospital care spanning beyond 2 midnights from the point of admission due to high intensity of service, high risk for further deterioration and high frequency of surveillance required.*  Author: Clydie Braun, MD 06/18/2023 3:13 PM  For on call review [email or web address].

## 2023-06-18 NOTE — ED Notes (Signed)
Primary RN made aware of pt O2. RN is going to go assess pt at this time.

## 2023-06-18 NOTE — ED Notes (Signed)
Got patient into a gown on the monitor got patient warm blanket and got patient a pillow patient is resting with call bell in reach

## 2023-06-18 NOTE — ED Triage Notes (Signed)
Pt coming in from wound care center. Pt spo2 found to be at 80 on 2l from home. Placed on 4l hospital oxygen spo2 increased to 96%. Pt alert and oriented. Pt reports increased swelling in feet and in abdomin.

## 2023-06-18 NOTE — Consult Note (Addendum)
Cardiology Consult    Patient ID: [name] MRN: [id number], DOB/AGE: [birth year]   Admit date: 06/18/2023 Date of Consult: 06/18/2023  Primary Physician: Tresa Garter, MD Primary Cardiologist: Charlton Haws, MD Requesting Provider: Arlyss Queen, MD  Patient Profile    [name] is a 67 y.o. 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 Katrinka Blazing.  Past Medical History  Subjective  Past Medical History:  Diagnosis Date   Anxiety    Basal cell carcinoma of nose    Blood dyscrasia    trouble clotting    Carotid artery disease (HCC)    a. s/p L CEA 2009 (hx of evacuation of hematoma due to heparin); b. 01/2022 Carotid U/S: 1-39% bilat stenosis.   Chronic heart failure with preserved ejection fraction (HFpEF) (HCC)    a. 04/2022 Echo: EF 60-65%, no rwma, GrII DD, nl RV fxn, RVSP 47.35mmHg. Mild BAE. Mild MR. Mild-mod TR.   Chronic lower back pain    CKD (chronic kidney disease), stage III (HCC)    COPD (chronic obstructive pulmonary disease) (HCC)    Coronary artery disease    a. 2005 s/p CABG x 3 (LIMA->LAD, VG->OM1, VG->dRCA); b. 09/2011 s/p PCI/DES of the VG->OM1 and VG->dRCA; c. 11/2017 PCI: LM 50ost, LAD 90ost/p, OM1 100 CTO, RCA 100p CTO, VG->dRCA 99 ISR (4.0x22 Resolute Onyx DES), VG->OM1 100 CTO/ISR, LIMA->LAD patent.   Depression    Diabetes mellitus without complication (HCC)    borderline    Dysrhythmia    fluttering   Edema    GERD (gastroesophageal reflux disease)    Heart murmur    Heparin induced thrombocytopenia (HCC)    History of home oxygen therapy    2 liters at night prn   Hyperlipidemia    Hypertension    Lymphoma  (HCC) 12/21/2014   Myocardial infarction (HCC)    Normocytic anemia    Obesity    Sleep apnea    a. Not using CPAP.    Past Surgical History:  Procedure Laterality Date   BASAL CELL CARCINOMA EXCISION  2000's   nose   BIOPSY  08/22/2022   Procedure: BIOPSY;  Surgeon: Jeani Hawking, MD;  Location: [facility] ENDOSCOPY;  Service: Gastroenterology;;   BRONCHIAL NEEDLE ASPIRATION BIOPSY  07/24/2022   Procedure: BRONCHIAL NEEDLE ASPIRATION BIOPSIES;  Surgeon: Leslye Peer, MD;  Location: [facility] ENDOSCOPY;  Service: Cardiopulmonary;;   CARDIAC CATHETERIZATION  12/07/2017   CAROTID ENDARTERECTOMY  03/2008   left   COLONOSCOPY WITH PROPOFOL N/A 08/22/2022   Procedure: COLONOSCOPY WITH PROPOFOL;  Surgeon: Jeani Hawking, MD;  Location: [facility] ENDOSCOPY;  Service: Gastroenterology;  Laterality: N/A;   CORONARY ANGIOPLASTY WITH STENT PLACEMENT  09/29/11   "2"   CORONARY ARTERY BYPASS GRAFT  2005   CABG X3   CORONARY STENT INTERVENTION  12/07/2017   CORONARY STENT INTERVENTION N/A 12/07/2017   Procedure: CORONARY STENT INTERVENTION;  Surgeon: Kathleene Hazel, MD;  Location: [facility];  Service: Cardiovascular;  Laterality: N/A;   ESOPHAGOGASTRODUODENOSCOPY (EGD) WITH PROPOFOL N/A 08/22/2022   Procedure: ESOPHAGOGASTRODUODENOSCOPY (EGD) WITH PROPOFOL;  Surgeon: Jeani Hawking, MD;  Location: [facility]OS[facility];  Service: Gastroenterology;  Laterality:  N/A;   evacuation of hematoma  03/2008   left neck; S/P endarterectomy; "cause heparin clotted it up"   I & D EXTREMITY Left 02/11/2015   Procedure: IRRIGATION AND DEBRIDEMENT LEFT LONG FINGER;  Surgeon: Betha Loa, MD;  Location: [facility];  Service: Orthopedics;  Laterality: Left;  I and D Left Long Finger   IR REMOVAL TUN ACCESS W/ PORT W/O FL MOD SED  05/18/2017   MASS EXCISION Left 12/11/2014   Procedure: EXCISIONAL BIOPSY OF LEFT SUPRA CLAVICULAR NECK MASS;  Surgeon: Osborn Coho, MD;  Location: [facility];  Service: ENT;  Laterality: Left;    PERCUTANEOUS CORONARY STENT INTERVENTION (PCI-S) N/A 09/29/2011   Procedure: PERCUTANEOUS CORONARY STENT INTERVENTION (PCI-S);  Surgeon: Tonny Bollman, MD;  Location: [facility] CATH LAB;  Service: Cardiovascular;  Laterality: N/A;   POLYPECTOMY  08/22/2022   Procedure: POLYPECTOMY;  Surgeon: Jeani Hawking, MD;  Location: [facility] ENDOSCOPY;  Service: Gastroenterology;;   Tod [place] PLACEMENT  2016   RIGHT/LEFT HEART CATH AND CORONARY/GRAFT ANGIOGRAPHY N/A 12/07/2017   Procedure: RIGHT/LEFT HEART CATH AND CORONARY/GRAFT ANGIOGRAPHY;  Surgeon: Kathleene Hazel, MD;  Location: [facility];  Service: Cardiovascular;  Laterality: N/A;   SUPERFICIAL LYMPH NODE BIOPSY / EXCISION Left    VIDEO BRONCHOSCOPY WITH ENDOBRONCHIAL ULTRASOUND N/A 07/24/2022   Procedure: VIDEO BRONCHOSCOPY WITH ENDOBRONCHIAL ULTRASOUND;  Surgeon: Leslye Peer, MD;  Location: [facility] ENDOSCOPY;  Service: Cardiopulmonary;  Laterality: N/A;     Allergies  Allergies  Allergen Reactions   Bendamustine Hcl     See hospital notes from 01/12/15, legs turned black   Heparin Other (See Comments)    Heparin induced thrombocytopenia    Bendamustine Hcl Rash    Severe   Tape Rash    If left on for prolong periods of time       History of Present Illness     67 y/o ? 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  He prev underwent CABG x 3 in 2005 w/ subsequent PCI/DES to the VG  Distal RCA,  and DES in the VG  OM1 in 2013.  In April 2019, he required repeat PCI and DES placement in the setting of in-stent restenosis w/in the VG  Distal RCA.  The previously placed stent in the VG  OM1 was noted to be chronically occluded.  Most recent echo in 04/2022 showed EF 60-65%, w/ grade II diastolic dysfxn, RVSP of 47.9mmHg, mild BAE, mild MR, and mild-mod TR.       Mr. Bertino was seen in cardiology clinic in 04/2023, at which time he was noted to have significant lower ext edema, despite recently coming off of amlodipine.  He was placed on metolazone 5mg  prn w/ torsemide 100 mg daily and referred to wound clinic.  He did not tolerate metolazone due to orthostatic symptoms, and though he took his torsemide daily, he often took it at night, where he noted limited effect.  LE venous duplex in early October was neg for DVT.  Over the past month, he has noted worsening lower ext edema and increasing abd girth assoc w/ wt gain, dyspnea, and orthopnea.  He was seen in wound clinic today, and referred tot he ED due to worsening edema, anasarca, and hypoxia w/ O2 sat of 80% on 2lpm.  Upon arrival to the  ED, he was afebrile w/ BP of 122/67.  O2 sats improved to 100% on 2lpm.  ECG showed Undetermined rhythm due to significant baseline artifact, 73, LAD, RBBB.  Tele w/ what appear to be flutter waves in lead II, less so appreciated in V1.  Labs notable for stable normocytic anemia @ 9.2/31.3, elevation in BUN/Creat above prior baseline @ 42/1.96.  BNP markedly elevated @ 2104.7. Mild hsTrop elevation w/ down trend (97  88).  CXR w/ cardiomegaly, vasc congestion, edema.  He was treated w/ lasix 80 mg IV x 1 in the ED and admitted.  Currently resting comfortably w/ wife @ bedside.  Inpatient Medications  Subjective    [START ON 06/19/2023] aspirin EC  81 mg Oral QPM   clonazePAM  1 mg Oral QHS   [START ON 06/19/2023] clopidogrel  75 mg Oral Daily   [START ON 06/19/2023] furosemide  40 mg Intravenous BID   metoprolol succinate  12.5 mg Oral Daily   simvastatin  20 mg Oral QHS   sodium chloride flush  3 mL Intravenous Q12H    Family History    Family History  Problem Relation Age of Onset   Heart disease Father    Bone cancer Mother    He indicated that his mother is deceased. He indicated that his father is deceased. He indicated that his sister is alive. He indicated  that his maternal grandmother is deceased. He indicated that his maternal grandfather is deceased. He indicated that his paternal grandmother is deceased. He indicated that his paternal grandfather is deceased.   Social History    Social History   Socioeconomic History   Marital status: Married    Spouse name: Talbert Forest   Number of children: 1   Years of education: Not on file   Highest education level: Not on file  Occupational History   Occupation: Event organiser    Comment: Retired  Tobacco Use   Smoking status: Former    Current packs/day: 0.00    Average packs/day: 2.0 packs/day for 41.0 years (82.0 ttl pk-yrs)    Types: Cigarettes    Start date: 11/23/1976    Quit date: 11/23/2017    Years since quitting: 5.5   Smokeless tobacco: Former    Types: Chew   Tobacco comments:    consult entered  Vaping Use   Vaping status: Never Used  Substance and Sexual Activity   Alcohol use: No    Alcohol/week: 0.0 standard drinks of alcohol    Comment: 09/29/11 "quit years ago"   Drug use: Not Currently    Types: Marijuana    Comment: as of 07/21/22: occasional pipe last use 07/20/22   Sexual activity: Not Currently  Other Topics Concern   Not on file  Social History Narrative   Not on file   Social Determinants of Health   Financial Resource Strain: Low Risk  (03/05/2023)   Overall Financial Resource Strain (CARDIA)    Difficulty of Paying Living Expenses: Not very hard  Food Insecurity: No Food Insecurity (06/18/2023)   Hunger Vital Sign    Worried About Running Out of Food in the Last Year: Never true    Ran Out of Food in the Last Year: Never true  Transportation Needs: No Transportation Needs (06/18/2023)   PRAPARE - Administrator, Civil Service (Medical): No    Lack of Transportation (Non-Medical): No  Physical Activity: Inactive (06/11/2023)   Exercise Vital Sign    Days of Exercise per Week: 0  days    Minutes of Exercise per Session: 0 min  Stress: Stress  Concern Present (06/11/2023)   [employer] of Occupational Health - Occupational Stress Questionnaire    Feeling of Stress : To some extent  Social Connections: Moderately Integrated (03/05/2023)   Social Connection and Isolation Panel [NHANES]    Frequency of Communication with Friends and Family: Once a week    Frequency of Social Gatherings with Friends and Family: Once a week    Attends Religious Services: More than 4 times per year    Active Member of [employer] or Organizations: Yes    Attends Banker Meetings: Never    Marital Status: Married  Catering manager Violence: Not At Risk (06/18/2023)   Humiliation, Afraid, Rape, and Kick questionnaire    Fear of Current or Ex-Partner: No    Emotionally Abused: No    Physically Abused: No    Sexually Abused: No     Review of Systems    General:  No chills, fever, night sweats.  +++ wt gain. Cardiovascular:  No chest pain, +++ dyspnea on exertion, +++ bilat LE edema and inc abd girth, +++ orthopnea, no palpitations, paroxysmal nocturnal dyspnea. Dermatological: No rash, lesions/masses Respiratory: No cough, +++ dyspnea Urologic: No hematuria, dysuria Abdominal:   +++ inc abd girth.  No nausea, vomiting, diarrhea, bright red blood per rectum, melena, or hematemesis Neurologic:  No visual changes, wkns, changes in mental status. All other systems reviewed and are otherwise negative except as noted above.    Objective  Physical Exam    Blood pressure (!) 118/50, pulse 73, temperature 98.1 F (36.7 C), temperature source Oral, resp. rate 18, height 6' (1.829 m), weight 118.4 kg, SpO2 92%.  General: Pleasant, NAD Psych: Normal affect. Neuro: Alert and oriented X 3. Moves all extremities spontaneously. HEENT: Normal  Neck: Supple without bruits.  JVD to earlobes. Lungs:  Resp regular and unlabored, coarse breath sounds throughout bilat. Heart: RRR.  2/6 syst murmur along L sternal border and apex. Abdomen: Obese,  edematous, non-tender, non-distended, BS + x 4.  Extremities: No clubbing, cyanosis.  Lower legs are wrapped.  2+ bilat thigh edema.  DP/PT2+, Radials 2+ and equal bilaterally.  Labs    Cardiac Enzymes Recent Labs  Lab 06/18/23 1213 06/18/23 1351  TROPONINIHS 97* 88*     BNP    Component Value Date/Time   BNP 2,104.7 (H) 06/18/2023 1213    ProBNP    Component Value Date/Time   PROBNP 78.0 09/15/2011 1114    Lab Results  Component Value Date   WBC 7.6 06/18/2023   HGB 9.2 (L) 06/18/2023   HCT 31.3 (L) 06/18/2023   MCV 96.9 06/18/2023   PLT 136 (L) 06/18/2023    Recent Labs  Lab 06/18/23 1213 06/18/23 1351  NA 137  --   K 4.2  --   CL 84*  --   CO2 40*  --   BUN 42*  --   CREATININE 1.96*  --   CALCIUM 8.8*  --   PROT  --  6.8  BILITOT  --  1.1  ALKPHOS  --  81  ALT  --  15  AST  --  26  GLUCOSE 91  --    Lab Results  Component Value Date   CHOL 97 02/21/2023   HDL 41.60 02/21/2023   LDLCALC 48 02/21/2023   TRIG 37.0 02/21/2023   Lab Results  Component Value Date   HGBA1C 5.7 02/21/2023  Radiology Studies    DG Chest Port 1 View  Result Date: 06/18/2023 CLINICAL DATA:  Cough. EXAM: PORTABLE CHEST 1 VIEW COMPARISON:  Chest radiograph dated 02/17/2020 and CT dated 05/15/2022. FINDINGS: There is cardiomegaly with vascular congestion and edema. Pneumonia is not excluded. There is bilateral calcified pleural plaques. Trace bilateral pleural effusions versus pleural thickening. There is mild cardiomegaly. Atherosclerotic calcification of the aortic arch. Median sternotomy wires and CABG vascular clips. No acute osseous pathology. IMPRESSION: 1. Cardiomegaly with vascular congestion and edema. Pneumonia is not excluded. 2. Bilateral calcified pleural plaques. Electronically Signed   By: Elgie Collard M.D.   On: 06/18/2023 16:10      ECG & Cardiac Imaging    Undetermined rhythm due to significant baseline artifact, 73, LAD, RBBB - personally  reviewed.  Tele:  ? Aflutter - repeat 12 lead pending  Assessment & Plan    1.  Acute on chronic HFpEF:  H/o HFpEF and pulm HTN in setting of CAD, HTN, DM, obesity, and untreated OSA.  Over the past several months, he has noted increasing bilateral lower ext edema and inc abd girth w/ wt gain, worsening DOE, and orthopnea.  He takes torsemide 100 mg daily @ home, and has noted diminishing returns.  Prev Rx metolazone, but doesn't take due to orthostasis.  Admitted from wound clinic due to hypoxia w/ O2 sat of 80% on 2lpm.  BNP 2104.7, CXR w/ edema.  Massively volume overloaded on exam.  Will escalate diuresis to lasix 80 IV bid and provide another dose tonight.  HR/BP stable.  Echo ordered.  Pt reports compliance w/ low-salt diet at home - rarely eats out, wife preps meals from scratch.  Will need ongoing education re: symptom reporting.  2.  CAD:  s/p prior CABG in 2005 w/ PCI/DES to the VG  dRCA in 2019.  Known occlusion of the VG  OM1 and native OM1 w/ severe dzs in pLAD, though patent LIMA  LAD in 2019.  No recent c/p.  In setting of above, mild hsTrop elevation w/ down trend (97  88).  F/u echo.  Consider ischemic eval.  Cont asa, statin, plavix, ? blocker.  3.  ? Atrial flutter:  Review of 12 lead from this evening limited due to significant artifact.  Tele w/ apparent flutter waves in lead II, not apparent in V1.  Review of old 76 leads from 08/2022 and 03/2022 show afib/flutter.  Pt has never been anticoagulated (CHA2DS2VASc = 4).  Repeat 12 lead ECG now to confirm rhythm.  If fib/flutter confirmed tonight, very likely contributing to heart failure.  H/o HIT reported in PMH, thus if anticoagulation started, can not use heparin.  4.  Primary HTN:  stable.  Follow w/ diuresis.  5.  Acute on chronic stage III kidney dzs:  BUN/Creat elev above prior baseline @ 42/1.96 in setting of volume overload.  Follow w/ diuresis.  6.  HL:  LDL 48 in 02/2023.  Cont statin rx.  7.  Borderline DMII:  A1c 5.7 in  July.  8.  OSA:  Dx many years ago but did not tolerate CPAP.  Need to re-eval as outpt as likely contributing to pulm HTN/R heart failure.  9.  Carotid arterial Dzs:  s/p prior L CEA.  Stable, mild dzs in 01/2022.  Overdue for repeat u/s - can be done in outpt setting.  Risk Assessment/Risk Scores:        New York Heart Association (NYHA) Functional Class NYHA Class III  Signed, Nicolasa Ducking, NP 06/18/2023, 7:37 PM  For questions or updates, please contact   Please consult [email or web address] for contact info under Cardiology/STEMI.

## 2023-06-18 NOTE — Progress Notes (Addendum)
[name], [name] ([id number]) [id number] Page 1 of 20 Visit Report for 06/18/2023 Chief Complaint Document Details Patient Name: Date of Service: [name], [name]. 06/18/2023 10:00 A M Medical Record Number: [id number] Patient Account Number: [id number] Date of Birth/Sex: Treating RN: [birth year] (67 y.o. M) Primary Care Provider: Tresa Garter Other Clinician: Referring Provider: Treating Provider/Extender: Raiford Simmonds in Treatment: 1 Information Obtained from: Patient Chief Complaint Patient presents for treatment of an open ulcer due to venous insufficiency in setting DM 06/07/2023: here with ulcer of LLE, venous insufficiency, lymphedema Electronic Signature(s) Signed: 06/18/2023 10:55:37 AM By: Duanne Guess MD FACS Entered By: Duanne Guess on 06/18/2023 07:55:37 -------------------------------------------------------------------------------- Debridement Details Patient Name: Date of Service: [name]. 06/18/2023 10:00 A M Medical Record Number: [id number] Patient Account Number: [id number] Date of Birth/Sex: Treating RN: [birth year] (67 y.o. [name] Primary Care Provider: Tresa Garter Other Clinician: Referring Provider: Treating Provider/Extender: Raiford Simmonds in Treatment: 1 Debridement Performed for Assessment: Wound #7 Left,Anterior Ankle Performed By: Physician Duanne Guess, MD The following information was scribed by: Samuella Bruin The information was scribed for: Duanne Guess Debridement Type: Debridement Level of Consciousness (Pre-procedure): Awake and Alert Pre-procedure Verification/Time Out Yes - 10:36 Taken: Start Time: 10:36 Pain Control: Lidocaine 4% T opical Solution Percent of Wound Bed Debrided: 100% T Area Debrided (cm): otal 51.02 Tissue and other material debrided: Non-Viable, Slough, Subcutaneous, Skin:  Epidermis, Slough Level: Skin/Subcutaneous Tissue Debridement Description: Excisional Instrument: Curette, Forceps, Scissors Bleeding: Minimum Hemostasis Achieved: Pressure Response to Treatment: Procedure was tolerated well Level of Consciousness (Post- Awake and Alert procedure): Post Debridement Measurements of Total Wound Length: (cm) 5.2 Width: (cm) 12.5 Depth: (cm) 0.1 Volume: (cm) 5.105 Character of Wound/Ulcer Post Debridement: Improved [name], [name] ([id number]) [phone].pdf Page 2 of 20 Post Procedure Diagnosis Same as Pre-procedure Electronic Signature(s) Signed: 06/18/2023 12:35:03 PM By: Duanne Guess MD FACS Signed: 06/18/2023 4:14:29 PM By: Samuella Bruin Entered By: Samuella Bruin on 06/18/2023 07:42:15 -------------------------------------------------------------------------------- Debridement Details Patient Name: Date of Service: [name]. 06/18/2023 10:00 A M Medical Record Number: [id number] Patient Account Number: [id number] Date of Birth/Sex: Treating RN: 12/05/1955 (67 y.o. [name] Primary Care Provider: Tresa Garter Other Clinician: Referring Provider: Treating Provider/Extender: Raiford Simmonds in Treatment: 1 Debridement Performed for Assessment: Wound #8 Left,Distal,Anterior Lower Leg Performed By: Physician Duanne Guess, MD The following information was scribed by: Samuella Bruin The information was scribed for: Duanne Guess Debridement Type: Debridement Level of Consciousness (Pre-procedure): Awake and Alert Pre-procedure Verification/Time Out Yes - 10:36 Taken: Start Time: 10:36 Pain Control: Lidocaine 4% T opical Solution Percent of Wound Bed Debrided: 100% T Area Debrided (cm): otal 45.92 Tissue and other material debrided: Non-Viable, Slough, Subcutaneous, Skin: Epidermis, Slough Level: Skin/Subcutaneous Tissue Debridement  Description: Excisional Instrument: Curette, Forceps, Scissors Bleeding: Minimum Hemostasis Achieved: Pressure Response to Treatment: Procedure was tolerated well Level of Consciousness (Post- Awake and Alert procedure): Post Debridement Measurements of Total Wound Length: (cm) 6.5 Width: (cm) 9 Depth: (cm) 0.1 Volume: (cm) 4.595 Character of Wound/Ulcer Post Debridement: Improved Post Procedure Diagnosis Same as Pre-procedure Electronic Signature(s) Signed: 06/18/2023 12:35:03 PM By: Duanne Guess MD FACS Signed: 06/18/2023 4:14:29 PM By: Samuella Bruin Entered By: Samuella Bruin on 06/18/2023 07:42:38 -------------------------------------------------------------------------------- Debridement Details Patient Name: Date of Service: [name]. 06/18/2023 10:00 A M Medical Record Number: [id number] Patient Account Number: [id number] [name] ([id number])  [id number] Page 3 of 20 Date of Birth/Sex: Treating RN: 1956-07-22 (67 y.o. [name] Primary Care Provider: Other Clinician: Tresa Garter Referring Provider: Treating Provider/Extender: Raiford Simmonds in Treatment: 1 Debridement Performed for Assessment: Wound #6 Left,Medial Lower Leg Performed By: Physician Duanne Guess, MD The following information was scribed by: Samuella Bruin The information was scribed for: Duanne Guess Debridement Type: Debridement Level of Consciousness (Pre-procedure): Awake and Alert Pre-procedure Verification/Time Out Yes - 10:36 Taken: Start Time: 10:36 Pain Control: Lidocaine 4% T opical Solution Percent of Wound Bed Debrided: 100% T Area Debrided (cm): otal 21.48 Tissue and other material debrided: Non-Viable, Slough, Subcutaneous, Skin: Epidermis, Slough Level: Skin/Subcutaneous Tissue Debridement Description: Excisional Instrument: Curette, Forceps, Scissors Bleeding:  Minimum Hemostasis Achieved: Pressure Response to Treatment: Procedure was tolerated well Level of Consciousness (Post- Awake and Alert procedure): Post Debridement Measurements of Total Wound Length: (cm) 4.8 Width: (cm) 5.7 Depth: (cm) 0.1 Volume: (cm) 2.149 Character of Wound/Ulcer Post Debridement: Improved Post Procedure Diagnosis Same as Pre-procedure Electronic Signature(s) Signed: 06/18/2023 12:35:03 PM By: Duanne Guess MD FACS Signed: 06/18/2023 4:14:29 PM By: Samuella Bruin Entered By: Samuella Bruin on 06/18/2023 07:43:04 -------------------------------------------------------------------------------- Debridement Details Patient Name: Date of Service: [name]. 06/18/2023 10:00 A M Medical Record Number: [id number] Patient Account Number: [id number] Date of Birth/Sex: Treating RN: September 02, 1955 (67 y.o. [name] Primary Care Provider: Tresa Garter Other Clinician: Referring Provider: Treating Provider/Extender: Raiford Simmonds in Treatment: 1 Debridement Performed for Assessment: Wound #5 Left,Anterior Lower Leg Performed By: Physician Duanne Guess, MD The following information was scribed by: Samuella Bruin The information was scribed for: Duanne Guess Debridement Type: Debridement Level of Consciousness (Pre-procedure): Awake and Alert Pre-procedure Verification/Time Out Yes - 10:36 Taken: Start Time: 10:36 Pain Control: Lidocaine 4% T opical Solution Percent of Wound Bed Debrided: 100% T Area Debrided (cm): otal 4.51 Tissue and other material debrided: Non-Viable, Slough, Subcutaneous, Slough Level: Skin/Subcutaneous Tissue Debridement Description: Excisional Instrument: Curette, Forceps, Scissors Bleeding: Minimum Hemostasis Achieved: Pressure [name], [name] ([id number]) [phone].pdf Page 4 of 20 Response to Treatment: Procedure was tolerated  well Level of Consciousness (Post- Awake and Alert procedure): Post Debridement Measurements of Total Wound Length: (cm) 2.5 Width: (cm) 2.3 Depth: (cm) 0.1 Volume: (cm) 0.452 Character of Wound/Ulcer Post Debridement: Improved Post Procedure Diagnosis Same as Pre-procedure Electronic Signature(s) Signed: 06/18/2023 12:35:03 PM By: Duanne Guess MD FACS Signed: 06/18/2023 4:14:29 PM By: Samuella Bruin Entered By: Samuella Bruin on 06/18/2023 07:43:46 -------------------------------------------------------------------------------- Debridement Details Patient Name: Date of Service: [name]. 06/18/2023 10:00 A M Medical Record Number: [id number] Patient Account Number: [id number] Date of Birth/Sex: Treating RN: [birth year] (67 y.o. [name] Primary Care Provider: Tresa Garter Other Clinician: Referring Provider: Treating Provider/Extender: Raiford Simmonds in Treatment: 1 Debridement Performed for Assessment: Wound #9 Left,Lateral Lower Leg Performed By: Physician Duanne Guess, MD The following information was scribed by: Samuella Bruin The information was scribed for: Duanne Guess Debridement Type: Debridement Level of Consciousness (Pre-procedure): Awake and Alert Pre-procedure Verification/Time Out Yes - 10:36 Taken: Start Time: 10:36 Pain Control: Lidocaine 4% T opical Solution Percent of Wound Bed Debrided: 100% T Area Debrided (cm): otal 0.75 Tissue and other material debrided: Non-Viable, Slough, Subcutaneous, Skin: Epidermis, Slough Level: Skin/Subcutaneous Tissue Debridement Description: Excisional Instrument: Curette, Forceps, Scissors Bleeding: Minimum Hemostasis Achieved: Pressure Response to Treatment: Procedure was tolerated well Level of Consciousness (Post- Awake and Alert procedure):  Post Debridement Measurements of Total Wound Length: (cm) 1.2 Width: (cm) 0.8 Depth: (cm)  0.1 Volume: (cm) 0.075 Character of Wound/Ulcer Post Debridement: Improved Post Procedure Diagnosis Same as Pre-procedure Electronic Signature(s) Signed: 06/18/2023 12:35:03 PM By: Duanne Guess MD FACS Signed: 06/18/2023 4:14:29 PM By: Samuella Bruin Entered By: Samuella Bruin on 06/18/2023 07:44:43 Delphina Cahill ([id number]) [id number] Page 5 of 20 -------------------------------------------------------------------------------- Debridement Details Patient Name: Date of Service: [name], [name]. 06/18/2023 10:00 A M Medical Record Number: [id number] Patient Account Number: [id number] Date of Birth/Sex: Treating RN: [birth year] (67 y.o. [name] Primary Care Provider: Tresa Garter Other Clinician: Referring Provider: Treating Provider/Extender: Raiford Simmonds in Treatment: 1 Debridement Performed for Assessment: Wound #10 Right,Lateral Lower Leg Performed By: Physician Duanne Guess, MD The following information was scribed by: Samuella Bruin The information was scribed for: Duanne Guess Debridement Type: Debridement Level of Consciousness (Pre-procedure): Awake and Alert Pre-procedure Verification/Time Out Yes - 10:36 Taken: Start Time: 10:36 Pain Control: Lidocaine 4% T opical Solution Percent of Wound Bed Debrided: 50% T Area Debrided (cm): otal 12.25 Tissue and other material debrided: Non-Viable, Slough, Subcutaneous, Skin: Epidermis, Slough Level: Skin/Subcutaneous Tissue Debridement Description: Excisional Instrument: Curette Bleeding: Minimum Hemostasis Achieved: Pressure Response to Treatment: Procedure was tolerated well Level of Consciousness (Post- Awake and Alert procedure): Post Debridement Measurements of Total Wound Length: (cm) 4.8 Width: (cm) 6.5 Depth: (cm) 0.1 Volume: (cm) 2.45 Character of Wound/Ulcer Post Debridement: Improved Post Procedure  Diagnosis Same as Pre-procedure Electronic Signature(s) Signed: 06/18/2023 12:35:03 PM By: Duanne Guess MD FACS Signed: 06/18/2023 4:14:29 PM By: Samuella Bruin Entered By: Samuella Bruin on 06/18/2023 07:45:23 -------------------------------------------------------------------------------- Debridement Details Patient Name: Date of Service: [name]. 06/18/2023 10:00 A M Medical Record Number: [id number] Patient Account Number: [id number] Date of Birth/Sex: Treating RN: [birth year] (67 y.o. [name] Primary Care Provider: Tresa Garter Other Clinician: Referring Provider: Treating Provider/Extender: Raiford Simmonds in Treatment: 1 Debridement Performed for Assessment: Wound #12 Right,Lateral Ankle Performed By: Physician Duanne Guess, MD The following information was scribed by: Samuella Bruin The information was scribed for: Duanne Guess Debridement Type: Debridement Level of Consciousness (Pre-procedure): Awake and Alert Pre-procedure Verification/Time Out Yes - 10:36 [name], [name] ([id number]) [phone].pdf Page 6 of 20 Yes - 10:36 Taken: Start Time: 10:36 Pain Control: Lidocaine 4% T opical Solution Percent of Wound Bed Debrided: 100% T Area Debrided (cm): otal 0.78 Tissue and other material debrided: Non-Viable, Slough, Subcutaneous, Skin: Epidermis, Slough Level: Skin/Subcutaneous Tissue Debridement Description: Excisional Instrument: Curette, Forceps, Scissors Bleeding: Minimum Hemostasis Achieved: Pressure Response to Treatment: Procedure was tolerated well Level of Consciousness (Post- Awake and Alert procedure): Post Debridement Measurements of Total Wound Length: (cm) 1 Width: (cm) 1 Depth: (cm) 0.1 Volume: (cm) 0.079 Character of Wound/Ulcer Post Debridement: Improved Post Procedure Diagnosis Same as Pre-procedure Electronic Signature(s) Signed:  06/18/2023 12:35:03 PM By: Duanne Guess MD FACS Signed: 06/18/2023 4:14:29 PM By: Samuella Bruin Entered By: Samuella Bruin on 06/18/2023 07:47:20 -------------------------------------------------------------------------------- HPI Details Patient Name: Date of Service: [name]. 06/18/2023 10:00 A M Medical Record Number: [id number] Patient Account Number: [id number] Date of Birth/Sex: Treating RN: 1956/08/12 (67 y.o. M) Primary Care Provider: Tresa Garter Other Clinician: Referring Provider: Treating Provider/Extender: Raiford Simmonds in Treatment: 1 History of Present Illness HPI Description: The patient is a 67 yrs old wm here with multiple medical problems for treatment of his right leg  ulcer. He has DM, hypertension, Hyperlipidemia and follicular neck lymphoma, active tobacco use. Due to prior potential allergic reaction to bendamustine (states legs turned black, was admitted to hospital), current lymphoma treatment to rituximab, Cytoxan, vincristine along with prednisone. 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 HbA1c 03/2015 6, Prealbumin 18, on supplements. 01/2015 ABI Right 0.73 Left 0.88 04/30/15 Repeat PET with near complete response to treatment. His oncologist noted that he has had poor tolerance to chemotherapy with recurrent infection and recommended discontinuation of chemotherapy and go on maintenance rituximab . ABI/TBI 03/2015: Right 0.76, monophasic AT biphasic PT venous reflux study- reflux noted in right GSV, left popliteal. , ; Current Profore lite, silver alginate to RLE and collagen to LLE. Refer back to [facility]- is patient surgical candidate given above and chronic ulcer. 07/12/15 Last seen a month ago. Current collagen and Juxtalite BLE. Only one area draining, nearly healed. F/u 2 weeks. READMISSION 06/07/2023 ***ABI L: 0.89*** This is a now 67 year old man previously seen in  our clinic about 8 years ago for bilateral lower extremity edema and ulcerations secondary to venous insufficiency. He returns today ulcers on his left lower leg that started as blisters. Based upon the appearance of his legs, he has stage II lymphedema in addition to the chronic venous insufficiency previously diagnosed. Remarkably, he still has the juxta lite stockings from 2016 and wears them regularly. 06/18/2023: The wounds on his left have deteriorated and he has developed new wounds, as well. These all appear to have started as blisters, despite being in compression. His wife says that his dressings were saturated so she took them off and patched things together with ABD pads and Kerlix and had him wearing his juxta lite stockings. He has opened up wounds on his right leg, as well, along with a blister on his posterior ankle just above the calcaneus. He has anasarca with pitting edema on his abdomen and his blood pressure was low today, although he was asymptomatic. [name], [name] ([id number]) [id number] Page 7 of 20 Electronic Signature(s) Signed: 06/18/2023 10:57:14 AM By: Duanne Guess MD FACS Entered By: Duanne Guess on 06/18/2023 07:57:14 -------------------------------------------------------------------------------- Physical Exam Details Patient Name: Date of Service: [name]. 06/18/2023 10:00 A M Medical Record Number: [id number] Patient Account Number: [id number] Date of Birth/Sex: Treating RN: [birth year] (67 y.o. M) Primary Care Provider: Tresa Garter Other Clinician: Referring Provider: Treating Provider/Extender: Raiford Simmonds in Treatment: 1 Constitutional Hypotensive, asymptomatic. . . . no acute distress. Respiratory Normal work of breathing on supplemental oxygen. Notes 06/18/2023: The wounds on his left have deteriorated and he has developed new wounds, as well. These all appear to have  started as blisters. He has opened up wounds on his right leg, as well, along with a blister on his posterior ankle just above the calcaneus. He has anasarca with pitting edema on his abdomen and his blood pressure was low today, although he was asymptomatic. Electronic Signature(s) Signed: 06/18/2023 10:58:05 AM By: Duanne Guess MD FACS Entered By: Duanne Guess on 06/18/2023 07:58:05 -------------------------------------------------------------------------------- Physician Orders Details Patient Name: Date of Service: [name]. 06/18/2023 10:00 A M Medical Record Number: [id number] Patient Account Number: [id number] Date of Birth/Sex: Treating RN: [birth year] (67 y.o. [name] Primary Care Provider: Tresa Garter Other Clinician: Referring Provider: Treating Provider/Extender: Raiford Simmonds in Treatment: 1 The following information was scribed by: Samuella Bruin The information was scribed for: Lady Gary,  Victorino Dike Verbal / Phone Orders: No Diagnosis Coding ICD-10 Coding Code Description [phone] Non-pressure chronic ulcer of other part of left lower leg with fat layer exposed L97.812 Non-pressure chronic ulcer of other part of right lower leg with fat layer exposed I87.2 Venous insufficiency (chronic) (peripheral) E11.622 Type 2 diabetes mellitus with other skin ulcer I89.0 Lymphedema, not elsewhere classified Follow-up Appointments ppointment in 1 week. - Dr. Lady Gary RM 1 Return A Anesthetic (In clinic) Topical Lidocaine 4% applied to wound bed [address] [name], [name] ([id number]) [phone].pdf Page 8 of 20 May shower with protection but do not get wound dressing(s) wet. Protect dressing(s) with water repellant cover (for example, large plastic bag) or a cast cover and may then take shower. - may purchase cast cover at [facility], [facility] or [employer] in the wound care supply  section Lymphedema Treatment Plan - Exercise, Compression and Elevation Exercise daily as tolerated. (Walking, ROM, Calf Pumps and Toe Taps) Compression Wraps as ordered Elevate legs 30 - 60 minutes at or above heart level at least 3 - 4 times daily as able/tolerated Avoid standing for long periods and elevate leg(s) parallel to the floor when sitting Wound Treatment Wound #10 - Lower Leg Wound Laterality: Right, Lateral Cleanser: Soap and Water 1 x Per Week Discharge Instructions: May shower and wash wound with dial antibacterial soap and water prior to dressing change. Cleanser: Wound Cleanser 1 x Per Week Discharge Instructions: Cleanse the wound with wound cleanser prior to applying a clean dressing using gauze sponges, not tissue or cotton balls. Peri-Wound Care: Sween Lotion (Moisturizing lotion) 1 x Per Week Discharge Instructions: Apply moisturizing lotion as directed Topical: Gentamicin 1 x Per Week Discharge Instructions: As directed by physician Topical: Mupirocin Ointment 1 x Per Week Discharge Instructions: Apply Mupirocin (Bactroban) as instructed Prim Dressing: Maxorb Extra Ag+ Alginate Dressing, 4x4.75 (in/in) 1 x Per Week ary Discharge Instructions: Apply to wound bed as instructed Secondary Dressing: ABD Pad, 8x10 1 x Per Week Discharge Instructions: Apply over primary dressing as directed. Compression Wrap: Urgo K2, (equivalent to a 4 layer) two layer compression system, regular 1 x Per Week Discharge Instructions: Apply Urgo K2 as directed (alternative to 4 layer compression). Wound #11 - Lower Leg Wound Laterality: Right, Anterior Cleanser: Soap and Water 1 x Per Week Discharge Instructions: May shower and wash wound with dial antibacterial soap and water prior to dressing change. Cleanser: Wound Cleanser 1 x Per Week Discharge Instructions: Cleanse the wound with wound cleanser prior to applying a clean dressing using gauze sponges, not tissue or cotton  balls. Peri-Wound Care: Sween Lotion (Moisturizing lotion) 1 x Per Week Discharge Instructions: Apply moisturizing lotion as directed Topical: Gentamicin 1 x Per Week Discharge Instructions: As directed by physician Topical: Mupirocin Ointment 1 x Per Week Discharge Instructions: Apply Mupirocin (Bactroban) as instructed Prim Dressing: Maxorb Extra Ag+ Alginate Dressing, 4x4.75 (in/in) 1 x Per Week ary Discharge Instructions: Apply to wound bed as instructed Secondary Dressing: ABD Pad, 8x10 1 x Per Week Discharge Instructions: Apply over primary dressing as directed. Compression Wrap: Urgo K2, (equivalent to a 4 layer) two layer compression system, regular 1 x Per Week Discharge Instructions: Apply Urgo K2 as directed (alternative to 4 layer compression). Wound #12 - Ankle Wound Laterality: Right, Lateral Cleanser: Soap and Water 1 x Per Week Discharge Instructions: May shower and wash wound with dial antibacterial soap and water prior to dressing change. Cleanser: Wound Cleanser 1 x Per Week Discharge Instructions: Cleanse the wound with  wound cleanser prior to applying a clean dressing using gauze sponges, not tissue or cotton balls. Peri-Wound Care: Sween Lotion (Moisturizing lotion) 1 x Per Week Discharge Instructions: Apply moisturizing lotion as directed Topical: Gentamicin 1 x Per Week Discharge Instructions: As directed by physician Topical: Mupirocin Ointment 1 x Per Week Discharge Instructions: Apply Mupirocin (Bactroban) as instructed Prim Dressing: Maxorb Extra Ag+ Alginate Dressing, 4x4.75 (in/in) ary 1 x Per Week [name], [name] ([id number]) [phone].pdf Page 9 of 20 Discharge Instructions: Apply to wound bed as instructed Secondary Dressing: ABD Pad, 8x10 1 x Per Week Discharge Instructions: Apply over primary dressing as directed. Compression Wrap: Urgo K2, (equivalent to a 4 layer) two layer compression system, regular 1 x Per  Week Discharge Instructions: Apply Urgo K2 as directed (alternative to 4 layer compression). Wound #5 - Lower Leg Wound Laterality: Left, Anterior Cleanser: Soap and Water 1 x Per Week Discharge Instructions: May shower and wash wound with dial antibacterial soap and water prior to dressing change. Cleanser: Wound Cleanser 1 x Per Week Discharge Instructions: Cleanse the wound with wound cleanser prior to applying a clean dressing using gauze sponges, not tissue or cotton balls. Peri-Wound Care: Sween Lotion (Moisturizing lotion) 1 x Per Week Discharge Instructions: Apply moisturizing lotion as directed Topical: Gentamicin 1 x Per Week Discharge Instructions: As directed by physician Topical: Mupirocin Ointment 1 x Per Week Discharge Instructions: Apply Mupirocin (Bactroban) as instructed Prim Dressing: Maxorb Extra Ag+ Alginate Dressing, 4x4.75 (in/in) 1 x Per Week ary Discharge Instructions: Apply to wound bed as instructed Secondary Dressing: ABD Pad, 8x10 1 x Per Week Discharge Instructions: Apply over primary dressing as directed. Compression Wrap: Urgo K2, (equivalent to a 4 layer) two layer compression system, regular 1 x Per Week Discharge Instructions: Apply Urgo K2 as directed (alternative to 4 layer compression). Wound #6 - Lower Leg Wound Laterality: Left, Medial Cleanser: Soap and Water 1 x Per Week Discharge Instructions: May shower and wash wound with dial antibacterial soap and water prior to dressing change. Cleanser: Wound Cleanser 1 x Per Week Discharge Instructions: Cleanse the wound with wound cleanser prior to applying a clean dressing using gauze sponges, not tissue or cotton balls. Peri-Wound Care: Sween Lotion (Moisturizing lotion) 1 x Per Week Discharge Instructions: Apply moisturizing lotion as directed Topical: Gentamicin 1 x Per Week Discharge Instructions: As directed by physician Topical: Mupirocin Ointment 1 x Per Week Discharge Instructions: Apply  Mupirocin (Bactroban) as instructed Prim Dressing: Maxorb Extra Ag+ Alginate Dressing, 4x4.75 (in/in) 1 x Per Week ary Discharge Instructions: Apply to wound bed as instructed Secondary Dressing: ABD Pad, 8x10 1 x Per Week Discharge Instructions: Apply over primary dressing as directed. Compression Wrap: Urgo K2, (equivalent to a 4 layer) two layer compression system, regular 1 x Per Week Discharge Instructions: Apply Urgo K2 as directed (alternative to 4 layer compression). Wound #7 - Ankle Wound Laterality: Left, Anterior Cleanser: Soap and Water 1 x Per Week Discharge Instructions: May shower and wash wound with dial antibacterial soap and water prior to dressing change. Cleanser: Wound Cleanser 1 x Per Week Discharge Instructions: Cleanse the wound with wound cleanser prior to applying a clean dressing using gauze sponges, not tissue or cotton balls. Peri-Wound Care: Sween Lotion (Moisturizing lotion) 1 x Per Week Discharge Instructions: Apply moisturizing lotion as directed Topical: Gentamicin 1 x Per Week Discharge Instructions: As directed by physician Topical: Mupirocin Ointment 1 x Per Week Discharge Instructions: Apply Mupirocin (Bactroban) as instructed Prim Dressing: Maxorb Extra Ag+ Alginate  Dressing, 4x4.75 (in/in) 1 x Per Week ary Discharge Instructions: Apply to wound bed as instructed FARIS, COOLMAN ([id number]) [phone].pdf Page 10 of 20 Secondary Dressing: ABD Pad, 8x10 1 x Per Week Discharge Instructions: Apply over primary dressing as directed. Compression Wrap: Urgo K2, (equivalent to a 4 layer) two layer compression system, regular 1 x Per Week Discharge Instructions: Apply Urgo K2 as directed (alternative to 4 layer compression). Wound #8 - Lower Leg Wound Laterality: Left, Anterior, Distal Cleanser: Soap and Water 1 x Per Week Discharge Instructions: May shower and wash wound with dial antibacterial soap and water prior to dressing  change. Cleanser: Wound Cleanser 1 x Per Week Discharge Instructions: Cleanse the wound with wound cleanser prior to applying a clean dressing using gauze sponges, not tissue or cotton balls. Peri-Wound Care: Sween Lotion (Moisturizing lotion) 1 x Per Week Discharge Instructions: Apply moisturizing lotion as directed Topical: Gentamicin 1 x Per Week Discharge Instructions: As directed by physician Topical: Mupirocin Ointment 1 x Per Week Discharge Instructions: Apply Mupirocin (Bactroban) as instructed Prim Dressing: Maxorb Extra Ag+ Alginate Dressing, 4x4.75 (in/in) 1 x Per Week ary Discharge Instructions: Apply to wound bed as instructed Secondary Dressing: ABD Pad, 8x10 1 x Per Week Discharge Instructions: Apply over primary dressing as directed. Compression Wrap: Urgo K2, (equivalent to a 4 layer) two layer compression system, regular 1 x Per Week Discharge Instructions: Apply Urgo K2 as directed (alternative to 4 layer compression). Wound #9 - Lower Leg Wound Laterality: Left, Lateral Cleanser: Soap and Water 1 x Per Week Discharge Instructions: May shower and wash wound with dial antibacterial soap and water prior to dressing change. Cleanser: Wound Cleanser 1 x Per Week Discharge Instructions: Cleanse the wound with wound cleanser prior to applying a clean dressing using gauze sponges, not tissue or cotton balls. Peri-Wound Care: Sween Lotion (Moisturizing lotion) 1 x Per Week Discharge Instructions: Apply moisturizing lotion as directed Topical: Gentamicin 1 x Per Week Discharge Instructions: As directed by physician Topical: Mupirocin Ointment 1 x Per Week Discharge Instructions: Apply Mupirocin (Bactroban) as instructed Prim Dressing: Maxorb Extra Ag+ Alginate Dressing, 4x4.75 (in/in) 1 x Per Week ary Discharge Instructions: Apply to wound bed as instructed Secondary Dressing: ABD Pad, 8x10 1 x Per Week Discharge Instructions: Apply over primary dressing as  directed. Compression Wrap: Urgo K2, (equivalent to a 4 layer) two layer compression system, regular 1 x Per Week Discharge Instructions: Apply Urgo K2 as directed (alternative to 4 layer compression). Patient Medications llergies: heparin, bendamustine HCl, adhesive A Notifications Medication Indication Start End 06/18/2023 lidocaine DOSE topical 4 % cream - cream topical Electronic Signature(s) Signed: 06/18/2023 12:35:03 PM By: Duanne Guess MD FACS Entered By: Duanne Guess on 06/18/2023 07:58:23 Delphina Cahill ([id number]) [id number].pdf Page 11 of 20 -------------------------------------------------------------------------------- Problem List Details Patient Name: Date of Service: GRAYDON, FOFANA. 06/18/2023 10:00 A M Medical Record Number: [id number] Patient Account Number: [id number] Date of Birth/Sex: Treating RN: 1955/10/30 (67 y.o. M) Primary Care Provider: Tresa Garter Other Clinician: Referring Provider: Treating Provider/Extender: Raiford Simmonds in Treatment: 1 Active Problems ICD-10 Encounter Code Description Active Date MDM Diagnosis [phone] Non-pressure chronic ulcer of other part of left lower leg with fat layer exposed10/24/2024 No Yes L97.812 Non-pressure chronic ulcer of other part of right lower leg with fat layer 06/18/2023 No Yes exposed I87.2 Venous insufficiency (chronic) (peripheral) 06/07/2023 No Yes E11.622 Type 2 diabetes mellitus with other skin ulcer 06/07/2023 No Yes I89.0 Lymphedema, not elsewhere  classified 06/07/2023 No Yes Inactive Problems Resolved Problems Electronic Signature(s) Signed: 06/18/2023 10:54:41 AM By: Duanne Guess MD FACS Previous Signature: 06/18/2023 10:19:45 AM Version By: Duanne Guess MD FACS Entered By: Duanne Guess on 06/18/2023 07:54:40 -------------------------------------------------------------------------------- Progress Note  Details Patient Name: Date of Service: [name]. 06/18/2023 10:00 A M Medical Record Number: [id number] Patient Account Number: [id number] Date of Birth/Sex: Treating RN: 02-08-1956 (67 y.o. M) Primary Care Provider: Tresa Garter Other Clinician: Referring Provider: Treating Provider/Extender: Raiford Simmonds in Treatment: 1 Subjective Chief Complaint Information obtained from Patient Patient presents for treatment of an open ulcer due to venous insufficiency in setting DM 06/07/2023: here with ulcer of LLE, venous insufficiency, lymphedema ABDULMALIK, DARCO ([id number]) [phone].pdf Page 12 of 20 History of Present Illness (HPI) The patient is a 67 yrs old wm here with multiple medical problems for treatment of his right leg ulcer. He has DM, hypertension, Hyperlipidemia and follicular neck lymphoma, active tobacco use. Due to prior potential allergic reaction to bendamustine (states legs turned black, was admitted to hospital), current lymphoma treatment to rituximab, Cytoxan, vincristine along with prednisone. 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 HbA1c 03/2015 6, Prealbumin 18, on supplements. 01/2015 ABI Right 0.73 Left 0.88 04/30/15 Repeat PET with near complete response to treatment. His oncologist noted that he has had poor tolerance to chemotherapy with recurrent infection and recommended discontinuation of chemotherapy and go on maintenance rituximab . ABI/TBI 03/2015: Right 0.76, monophasic AT biphasic PT venous reflux study- reflux noted in right GSV, left popliteal. , ; Current Profore lite, silver alginate to RLE and collagen to LLE. Refer back to [facility]- is patient surgical candidate given above and chronic ulcer. 07/12/15 Last seen a month ago. Current collagen and Juxtalite BLE. Only one area draining, nearly healed. F/u 2 weeks. READMISSION 06/07/2023 ***ABI L:  0.89*** This is a now 67 year old man previously seen in our clinic about 8 years ago for bilateral lower extremity edema and ulcerations secondary to venous insufficiency. He returns today ulcers on his left lower leg that started as blisters. Based upon the appearance of his legs, he has stage II lymphedema in addition to the chronic venous insufficiency previously diagnosed. Remarkably, he still has the juxta lite stockings from 2016 and wears them regularly. 06/18/2023: The wounds on his left have deteriorated and he has developed new wounds, as well. These all appear to have started as blisters, despite being in compression. His wife says that his dressings were saturated so she took them off and patched things together with ABD pads and Kerlix and had him wearing his juxta lite stockings. He has opened up wounds on his right leg, as well, along with a blister on his posterior ankle just above the calcaneus. He has anasarca with pitting edema on his abdomen and his blood pressure was low today, although he was asymptomatic. Patient History Information obtained from Patient. Family History Cancer - Mother, Heart Disease - Father, Hypertension - Father, No family history of Diabetes, Hereditary Spherocytosis, Kidney Disease, Lung Disease, Seizures, Stroke, Thyroid Problems, Tuberculosis. Social History Current some day smoker, Marital Status - Married, Alcohol Use - Never, Drug Use - No History, Caffeine Use - Daily. Medical History Eyes Patient has history of Cataracts - bil removed Denies history of Glaucoma, Optic Neuritis Ear/Nose/Mouth/Throat Denies history of Chronic sinus problems/congestion, Middle ear problems Hematologic/Lymphatic Patient has history of Lymphedema Denies history of Anemia, Hemophilia, Human Immunodeficiency Virus, Sickle Cell Disease  Respiratory Patient has history of Chronic Obstructive Pulmonary Disease (COPD), Sleep Apnea Denies history of Aspiration, Asthma,  Pneumothorax, Tuberculosis Cardiovascular Patient has history of Angina - none in 10 years, Arrhythmia - fluttering, Congestive Heart Failure, Coronary Artery Disease, Hypertension, Myocardial Infarction, Peripheral Venous Disease Denies history of Deep Vein Thrombosis, Hypotension, Peripheral Arterial Disease, Phlebitis, Vasculitis Gastrointestinal Denies history of Cirrhosis , Colitis, Crohns, Hepatitis A, Hepatitis B, Hepatitis C Endocrine Denies history of Type I Diabetes, Type II Diabetes Genitourinary Denies history of End Stage Renal Disease Immunological Denies history of Lupus Erythematosus, Raynauds, Scleroderma Integumentary (Skin) Denies history of History of Burn Musculoskeletal Patient has history of Osteoarthritis Denies history of Gout, Rheumatoid Arthritis, Osteomyelitis Neurologic Patient has history of Neuropathy - toes Denies history of Dementia, Quadriplegia, Paraplegia, Seizure Disorder Oncologic Patient has history of Received Chemotherapy Denies history of Received Radiation Psychiatric Denies history of Anorexia/bulimia, Confinement Anxiety Hospitalization/Surgery History - left carotid endarterectomy. - left neck hematoma removed left neck. - coronary angioplasty with stents x2. - CABG x3 vessel. - portacath placement. - cardiac cath. - polypectomy. 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 no acute distress. Vitals Time Taken: 10:05 AM, Height: 72 in, Weight: 252 lbs, BMI: 34.2, Temperature: 97.7 F, Pulse: 72 bpm, Respiratory Rate: 22 breaths/min, Blood Pressure: 82/48 mmHg. Respiratory Normal work of breathing on supplemental oxygen. General Notes: 06/18/2023: The wounds on his left have deteriorated and he has developed new wounds, as well. These all appear to have started as blisters. He has opened up wounds on his right leg, as well, along with a blister on his posterior ankle just above the calcaneus. He has anasarca with pitting edema on his abdomen and his blood pressure was low today, although he was asymptomatic. Integumentary (Hair, Skin) Wound #10 status is Open. Original cause of wound was Blister. The date acquired was: 06/15/2023. The wound is located on the Right,Lateral Lower Leg. The wound measures 4cm length x 6.5cm width x 0.1cm depth; 20.42cm^2 area and 2.042cm^3 volume. There is Fat Layer (Subcutaneous Tissue) exposed. There is no tunneling or undermining noted. There is a medium amount of serous drainage noted. The wound margin is flat and intact. There is large (67-100%) red granulation within the wound bed. There is a small (1-33%) amount of necrotic tissue within the wound bed including Adherent Slough. The periwound skin appearance had no abnormalities noted for texture. The periwound skin appearance had no abnormalities noted for moisture. The periwound skin appearance exhibited: Hemosiderin Staining. Periwound temperature was noted as No Abnormality. Wound #11 status is Open. Original cause of wound was Blister. The date acquired was: 06/15/2023. The wound is located on the Right,Anterior Lower Leg. The wound measures 4cm length x 6.5cm width x 0.1cm depth; 20.42cm^2 area and 2.042cm^3 volume. There is Fat Layer (Subcutaneous Tissue) exposed. There is no tunneling or  undermining noted. There is a medium amount of serous drainage noted. The wound margin is flat and intact. There is medium (34-66%) red granulation within the wound bed. There is a medium (34-66%) amount of necrotic tissue within the wound bed including Adherent Slough. The periwound  skin appearance had no abnormalities noted for texture. The periwound skin appearance had no abnormalities noted for moisture. The periwound skin appearance exhibited: Hemosiderin Staining. Periwound temperature was noted as No Abnormality. Wound #12 status is Open. Original cause of wound was Blister. The date acquired was: 06/18/2023. The wound is located on the Right,Lateral Ankle. The wound measures 1cm length x 1cm width x 0.1cm depth; 0.785cm^2 area and 0.079cm^3 volume. There is Fat Layer (Subcutaneous Tissue) exposed. There is no tunneling or undermining noted. There is a medium amount of serous drainage noted. The wound margin is distinct with the outline attached to the wound base. There is large (67-100%) red granulation within the wound bed. There is a small (1-33%) amount of necrotic tissue within the wound bed including Adherent Slough. The periwound skin appearance had no abnormalities noted for texture. The periwound skin appearance had no abnormalities noted for moisture. The periwound skin appearance exhibited: Hemosiderin Staining. Periwound temperature was noted as No Abnormality. Wound #5 status is Open. Original cause of wound was Blister. The date acquired was: 01/29/2023. The wound has been in treatment 1 weeks. The wound is located on the Left,Anterior Lower Leg. The wound measures 2.5cm length x 2.3cm width x 0.1cm depth; 4.516cm^2 area and 0.452cm^3 volume. There is Fat Layer (Subcutaneous Tissue) exposed. There is no tunneling or undermining noted. There is a medium amount of serous drainage noted. The wound margin is flat and intact. There is large (67-100%) red granulation within the wound bed.  There is no necrotic tissue within the wound bed. The periwound skin appearance had no abnormalities noted for texture. The periwound skin appearance had no abnormalities noted for moisture. The periwound skin appearance exhibited: Hemosiderin Staining. Periwound temperature was noted as No Abnormality. The periwound has tenderness on palpation. Wound #6 status is Open. Original cause of wound was Blister. The date acquired was: 01/29/2023. The wound has been in treatment 1 weeks. The wound is located on the Left,Medial Lower Leg. The wound measures 4.8cm length x 5.7cm width x 0.1cm depth; 21.488cm^2 area and 2.149cm^3 volume. There is Fat Layer (Subcutaneous Tissue) exposed. There is no tunneling or undermining noted. There is a medium amount of serous drainage noted. The wound margin is flat and intact. There is large (67-100%) red granulation within the wound bed. There is no necrotic tissue within the wound bed. The periwound skin appearance had no abnormalities noted for texture. The periwound skin appearance had no abnormalities noted for moisture. The periwound skin appearance exhibited: Hemosiderin Staining. Periwound temperature was noted as No Abnormality. The periwound has tenderness on palpation. Wound #7 status is Open. Original cause of wound was Blister. The date acquired was: 06/15/2023. The wound is located on the Left,Anterior Ankle. The wound measures 5.2cm length x 12.5cm width x 0.1cm depth; 51.051cm^2 area and 5.105cm^3 volume. There is Fat Layer (Subcutaneous Tissue) exposed. There is no tunneling or undermining noted. There is a medium amount of serous drainage noted. The wound margin is flat and intact. There is large (67-100%) red granulation within the wound bed. There is no necrotic tissue within the wound bed. The periwound skin appearance had no abnormalities noted for texture. The periwound skin appearance had no abnormalities noted for moisture. The periwound skin  appearance exhibited: Hemosiderin Staining. Periwound temperature was noted as No Abnormality. Wound #8 status is Open. Original cause of wound was Blister. The date acquired was: 06/15/2023. The wound is located on the [facility] Lower Leg. The wound measures 6.5cm length x  9cm width x 0.1cm depth; 45.946cm^2 area and 4.595cm^3 volume. There is Fat Layer (Subcutaneous Tissue) exposed. There is no tunneling or undermining noted. There is a medium amount of serous drainage noted. The wound margin is flat and intact. There is large (67-100%) red granulation within the wound bed. There is no necrotic tissue within the wound bed. The periwound skin appearance had no abnormalities noted for texture. The periwound skin appearance had no abnormalities noted for moisture. The periwound skin appearance exhibited: Hemosiderin Staining. Periwound temperature was noted as No Abnormality. Wound #9 status is Open. Original cause of wound was Blister. The date acquired was: 06/15/2023. The wound is located on the Left,Lateral Lower Leg. The wound measures 1.2cm length x 0.8cm width x 0.1cm depth; 0.754cm^2 area and 0.075cm^3 volume. There is Fat Layer (Subcutaneous Tissue) exposed. There is no tunneling or undermining noted. There is a medium amount of serous drainage noted. The wound margin is distinct with the outline attached to the wound base. There is large (67-100%) red granulation within the wound bed. There is no necrotic tissue within the wound bed. The periwound skin appearance had [name], [name] ([id number]) [phone].pdf Page 14 of 20 no abnormalities noted for texture. The periwound skin appearance had no abnormalities noted for moisture. The periwound skin appearance exhibited: Hemosiderin Staining. Periwound temperature was noted as No Abnormality. Assessment Active Problems ICD-10 Non-pressure chronic ulcer of other part of left lower leg with fat layer  exposed Non-pressure chronic ulcer of other part of right lower leg with fat layer exposed Venous insufficiency (chronic) (peripheral) Type 2 diabetes mellitus with other skin ulcer Lymphedema, not elsewhere classified Procedures Wound #10 Pre-procedure diagnosis of Wound #10 is a Lymphedema located on the Right,Lateral Lower Leg . There was a Excisional Skin/Subcutaneous Tissue Debridement with a total area of 12.25 sq cm performed by Duanne Guess, MD. With the following instrument(s): Curette to remove Non-Viable tissue/material. Material removed includes Subcutaneous Tissue, Slough, and Skin: Epidermis after achieving pain control using Lidocaine 4% Topical Solution. No specimens were taken. A time out was conducted at 10:36, prior to the start of the procedure. A Minimum amount of bleeding was controlled with Pressure. The procedure was tolerated well. Post Debridement Measurements: 4.8cm length x 6.5cm width x 0.1cm depth; 2.45cm^3 volume. Character of Wound/Ulcer Post Debridement is improved. Post procedure Diagnosis Wound #10: Same as Pre-Procedure Pre-procedure diagnosis of Wound #10 is a Lymphedema located on the Right,Lateral Lower Leg . There was a Double Layer Compression Therapy Procedure by Samuella Bruin, RN. Post procedure Diagnosis Wound #10: Same as Pre-Procedure Wound #12 Pre-procedure diagnosis of Wound #12 is a Lymphedema located on the Right,Lateral Ankle . There was a Excisional Skin/Subcutaneous Tissue Debridement with a total area of 0.78 sq cm performed by Duanne Guess, MD. With the following instrument(s): Curette, Forceps, and Scissors to remove Non-Viable tissue/material. Material removed includes Subcutaneous Tissue, Slough, and Skin: Epidermis after achieving pain control using Lidocaine 4% Topical Solution. No specimens were taken. A time out was conducted at 10:36, prior to the start of the procedure. A Minimum amount of bleeding was controlled with  Pressure. The procedure was tolerated well. Post Debridement Measurements: 1cm length x 1cm width x 0.1cm depth; 0.079cm^3 volume. Character of Wound/Ulcer Post Debridement is improved. Post procedure Diagnosis Wound #12: Same as Pre-Procedure Wound #5 Pre-procedure diagnosis of Wound #5 is a Lymphedema located on the Left,Anterior Lower Leg . There was a Excisional Skin/Subcutaneous Tissue Debridement with a total area of 4.51 sq cm  performed by Duanne Guess, MD. With the following instrument(s): Curette, Forceps, and Scissors to remove Non-Viable tissue/material. Material removed includes Subcutaneous Tissue and Slough and after achieving pain control using Lidocaine 4% Topical Solution. No specimens were taken. A time out was conducted at 10:36, prior to the start of the procedure. A Minimum amount of bleeding was controlled with Pressure. The procedure was tolerated well. Post Debridement Measurements: 2.5cm length x 2.3cm width x 0.1cm depth; 0.452cm^3 volume. Character of Wound/Ulcer Post Debridement is improved. Post procedure Diagnosis Wound #5: Same as Pre-Procedure Wound #6 Pre-procedure diagnosis of Wound #6 is a Lymphedema located on the Left,Medial Lower Leg . There was a Excisional Skin/Subcutaneous Tissue Debridement with a total area of 21.48 sq cm performed by Duanne Guess, MD. With the following instrument(s): Curette, Forceps, and Scissors to remove Non-Viable tissue/material. Material removed includes Subcutaneous Tissue, Slough, and Skin: Epidermis after achieving pain control using Lidocaine 4% Topical Solution. No specimens were taken. A time out was conducted at 10:36, prior to the start of the procedure. A Minimum amount of bleeding was controlled with Pressure. The procedure was tolerated well. Post Debridement Measurements: 4.8cm length x 5.7cm width x 0.1cm depth; 2.149cm^3 volume. Character of Wound/Ulcer Post Debridement is improved. Post procedure Diagnosis  Wound #6: Same as Pre-Procedure Wound #7 Pre-procedure diagnosis of Wound #7 is a Lymphedema located on the Left,Anterior Ankle . There was a Excisional Skin/Subcutaneous Tissue Debridement with a total area of 51.02 sq cm performed by Duanne Guess, MD. With the following instrument(s): Curette, Forceps, and Scissors to remove Non-Viable tissue/material. Material removed includes Subcutaneous Tissue, Slough, and Skin: Epidermis after achieving pain control using Lidocaine 4% Topical Solution. No specimens were taken. A time out was conducted at 10:36, prior to the start of the procedure. A Minimum amount of bleeding was controlled with Pressure. The procedure was tolerated well. Post Debridement Measurements: 5.2cm length x 12.5cm width x 0.1cm depth; 5.105cm^3 volume. Character of Wound/Ulcer Post Debridement is improved. Post procedure Diagnosis Wound #7: Same as Pre-Procedure Pre-procedure diagnosis of Wound #7 is a Lymphedema located on the Left,Anterior Ankle . There was a Double Layer Compression Therapy Procedure by Samuella Bruin, RN. Post procedure Diagnosis Wound #7: Same as Pre-Procedure Wound #8 Pre-procedure diagnosis of Wound #8 is a Lymphedema located on the Left,Distal,Anterior Lower Leg . There was a Excisional Skin/Subcutaneous Tissue Debridement with a total area of 45.92 sq cm performed by Duanne Guess, MD. With the following instrument(s): Curette, Forceps, and Scissors to remove Non-Viable tissue/material. Material removed includes Subcutaneous Tissue, Slough, and Skin: Epidermis after achieving pain control using Lidocaine 4% Topical Solution. No specimens were taken. A time out was conducted at 10:36, prior to the start of the procedure. A Minimum amount of bleeding was controlled with Pressure. The procedure was tolerated well. Post Debridement Measurements: 6.5cm length x 9cm width x 0.1cm depth; 4.595cm^3 volume. [name], [name] ([id number])  [id number] Page 15 of 20 Character of Wound/Ulcer Post Debridement is improved. Post procedure Diagnosis Wound #8: Same as Pre-Procedure Wound #9 Pre-procedure diagnosis of Wound #9 is a Lymphedema located on the Left,Lateral Lower Leg . There was a Excisional Skin/Subcutaneous Tissue Debridement with a total area of 0.75 sq cm performed by Duanne Guess, MD. With the following instrument(s): Curette, Forceps, and Scissors to remove Non-Viable tissue/material. Material removed includes Subcutaneous Tissue, Slough, and Skin: Epidermis after achieving pain control using Lidocaine 4% Topical Solution. No specimens were taken. A time out was conducted at 10:36, prior to the  start of the procedure. A Minimum amount of bleeding was controlled with Pressure. The procedure was tolerated well. Post Debridement Measurements: 1.2cm length x 0.8cm width x 0.1cm depth; 0.075cm^3 volume. Character of Wound/Ulcer Post Debridement is improved. Post procedure Diagnosis Wound #9: Same as Pre-Procedure Plan Follow-up Appointments: Return Appointment in 1 week. - Dr. Lady Gary RM 1 Anesthetic: (In clinic) Topical Lidocaine 4% applied to wound bed Bathing/ Shower/ Hygiene: May shower with protection but do not get wound dressing(s) wet. Protect dressing(s) with water repellant cover (for example, large plastic bag) or a cast cover and may then take shower. - may purchase cast cover at [facility], [facility] or [employer] in the wound care supply section Lymphedema Treatment Plan - Exercise, Compression and Elevation: Exercise daily as tolerated. (Walking, ROM, Calf Pumps and T T oe aps) Compression Wraps as ordered Elevate legs 30 - 60 minutes at or above heart level at least 3 - 4 times daily as able/tolerated Avoid standing for long periods and elevate leg(s) parallel to the floor when sitting The following medication(s) was prescribed: lidocaine topical 4 % cream cream topical was prescribed  at facility WOUND #10: - Lower Leg Wound Laterality: Right, Lateral Cleanser: Soap and Water 1 x Per Week/ Discharge Instructions: May shower and wash wound with dial antibacterial soap and water prior to dressing change. Cleanser: Wound Cleanser 1 x Per Week/ Discharge Instructions: Cleanse the wound with wound cleanser prior to applying a clean dressing using gauze sponges, not tissue or cotton balls. Peri-Wound Care: Sween Lotion (Moisturizing lotion) 1 x Per Week/ Discharge Instructions: Apply moisturizing lotion as directed Topical: Gentamicin 1 x Per Week/ Discharge Instructions: As directed by physician Topical: Mupirocin Ointment 1 x Per Week/ Discharge Instructions: Apply Mupirocin (Bactroban) as instructed Prim Dressing: Maxorb Extra Ag+ Alginate Dressing, 4x4.75 (in/in) 1 x Per Week/ ary Discharge Instructions: Apply to wound bed as instructed Secondary Dressing: ABD Pad, 8x10 1 x Per Week/ Discharge Instructions: Apply over primary dressing as directed. Com pression Wrap: Urgo K2, (equivalent to a 4 layer) two layer compression system, regular 1 x Per Week/ Discharge Instructions: Apply Urgo K2 as directed (alternative to 4 layer compression). WOUND #11: - Lower Leg Wound Laterality: Right, Anterior Cleanser: Soap and Water 1 x Per Week/ Discharge Instructions: May shower and wash wound with dial antibacterial soap and water prior to dressing change. Cleanser: Wound Cleanser 1 x Per Week/ Discharge Instructions: Cleanse the wound with wound cleanser prior to applying a clean dressing using gauze sponges, not tissue or cotton balls. Peri-Wound Care: Sween Lotion (Moisturizing lotion) 1 x Per Week/ Discharge Instructions: Apply moisturizing lotion as directed Topical: Gentamicin 1 x Per Week/ Discharge Instructions: As directed by physician Topical: Mupirocin Ointment 1 x Per Week/ Discharge Instructions: Apply Mupirocin (Bactroban) as instructed Prim Dressing: Maxorb Extra  Ag+ Alginate Dressing, 4x4.75 (in/in) 1 x Per Week/ ary Discharge Instructions: Apply to wound bed as instructed Secondary Dressing: ABD Pad, 8x10 1 x Per Week/ Discharge Instructions: Apply over primary dressing as directed. Com pression Wrap: Urgo K2, (equivalent to a 4 layer) two layer compression system, regular 1 x Per Week/ Discharge Instructions: Apply Urgo K2 as directed (alternative to 4 layer compression). WOUND #12: - Ankle Wound Laterality: Right, Lateral Cleanser: Soap and Water 1 x Per Week/ Discharge Instructions: May shower and wash wound with dial antibacterial soap and water prior to dressing change. Cleanser: Wound Cleanser 1 x Per Week/ Discharge Instructions: Cleanse the wound with wound cleanser prior to applying a  clean dressing using gauze sponges, not tissue or cotton balls. Peri-Wound Care: Sween Lotion (Moisturizing lotion) 1 x Per Week/ Discharge Instructions: Apply moisturizing lotion as directed Topical: Gentamicin 1 x Per Week/ Discharge Instructions: As directed by physician Topical: Mupirocin Ointment 1 x Per Week/ Discharge Instructions: Apply Mupirocin (Bactroban) as instructed Prim Dressing: Maxorb Extra Ag+ Alginate Dressing, 4x4.75 (in/in) 1 x Per Week/ ary Discharge Instructions: Apply to wound bed as instructed Secondary Dressing: ABD Pad, 8x10 1 x Per Week/ Discharge Instructions: Apply over primary dressing as directed. Com pression Wrap: Urgo K2, (equivalent to a 4 layer) two layer compression system, regular 1 x Per Week/ Discharge Instructions: Apply Urgo K2 as directed (alternative to 4 layer compression). WOUND #5: - Lower Leg Wound Laterality: Left, Anterior Cleanser: Soap and Water 1 x Per Week/ Discharge Instructions: May shower and wash wound with dial antibacterial soap and water prior to dressing change. Cleanser: Wound Cleanser 1 x Per Week/ Discharge Instructions: Cleanse the wound with wound cleanser prior to applying a clean  dressing using gauze sponges, not tissue or cotton balls. Peri-Wound Care: Sween Lotion (Moisturizing lotion) 1 x Per [place], [place] ([id number]) [id number] Page 16 of 20 Discharge Instructions: Apply moisturizing lotion as directed Topical: Gentamicin 1 x Per Week/ Discharge Instructions: As directed by physician Topical: Mupirocin Ointment 1 x Per Week/ Discharge Instructions: Apply Mupirocin (Bactroban) as instructed Prim Dressing: Maxorb Extra Ag+ Alginate Dressing, 4x4.75 (in/in) 1 x Per Week/ ary Discharge Instructions: Apply to wound bed as instructed Secondary Dressing: ABD Pad, 8x10 1 x Per Week/ Discharge Instructions: Apply over primary dressing as directed. Com pression Wrap: Urgo K2, (equivalent to a 4 layer) two layer compression system, regular 1 x Per Week/ Discharge Instructions: Apply Urgo K2 as directed (alternative to 4 layer compression). WOUND #6: - Lower Leg Wound Laterality: Left, Medial Cleanser: Soap and Water 1 x Per Week/ Discharge Instructions: May shower and wash wound with dial antibacterial soap and water prior to dressing change. Cleanser: Wound Cleanser 1 x Per Week/ Discharge Instructions: Cleanse the wound with wound cleanser prior to applying a clean dressing using gauze sponges, not tissue or cotton balls. Peri-Wound Care: Sween Lotion (Moisturizing lotion) 1 x Per Week/ Discharge Instructions: Apply moisturizing lotion as directed Topical: Gentamicin 1 x Per Week/ Discharge Instructions: As directed by physician Topical: Mupirocin Ointment 1 x Per Week/ Discharge Instructions: Apply Mupirocin (Bactroban) as instructed Prim Dressing: Maxorb Extra Ag+ Alginate Dressing, 4x4.75 (in/in) 1 x Per Week/ ary Discharge Instructions: Apply to wound bed as instructed Secondary Dressing: ABD Pad, 8x10 1 x Per Week/ Discharge Instructions: Apply over primary dressing as directed. Com pression Wrap: Urgo K2, (equivalent to a  4 layer) two layer compression system, regular 1 x Per Week/ Discharge Instructions: Apply Urgo K2 as directed (alternative to 4 layer compression). WOUND #7: - Ankle Wound Laterality: Left, Anterior Cleanser: Soap and Water 1 x Per Week/ Discharge Instructions: May shower and wash wound with dial antibacterial soap and water prior to dressing change. Cleanser: Wound Cleanser 1 x Per Week/ Discharge Instructions: Cleanse the wound with wound cleanser prior to applying a clean dressing using gauze sponges, not tissue or cotton balls. Peri-Wound Care: Sween Lotion (Moisturizing lotion) 1 x Per Week/ Discharge Instructions: Apply moisturizing lotion as directed Topical: Gentamicin 1 x Per Week/ Discharge Instructions: As directed by physician Topical: Mupirocin Ointment 1 x Per Week/ Discharge Instructions: Apply Mupirocin (Bactroban) as instructed Prim Dressing: Maxorb Extra Ag+ Alginate Dressing, 4x4.75 (in/in)  1 x Per Week/ ary Discharge Instructions: Apply to wound bed as instructed Secondary Dressing: ABD Pad, 8x10 1 x Per Week/ Discharge Instructions: Apply over primary dressing as directed. Com pression Wrap: Urgo K2, (equivalent to a 4 layer) two layer compression system, regular 1 x Per Week/ Discharge Instructions: Apply Urgo K2 as directed (alternative to 4 layer compression). WOUND #8: - Lower Leg Wound Laterality: Left, Anterior, Distal Cleanser: Soap and Water 1 x Per Week/ Discharge Instructions: May shower and wash wound with dial antibacterial soap and water prior to dressing change. Cleanser: Wound Cleanser 1 x Per Week/ Discharge Instructions: Cleanse the wound with wound cleanser prior to applying a clean dressing using gauze sponges, not tissue or cotton balls. Peri-Wound Care: Sween Lotion (Moisturizing lotion) 1 x Per Week/ Discharge Instructions: Apply moisturizing lotion as directed Topical: Gentamicin 1 x Per Week/ Discharge Instructions: As directed by  physician Topical: Mupirocin Ointment 1 x Per Week/ Discharge Instructions: Apply Mupirocin (Bactroban) as instructed Prim Dressing: Maxorb Extra Ag+ Alginate Dressing, 4x4.75 (in/in) 1 x Per Week/ ary Discharge Instructions: Apply to wound bed as instructed Secondary Dressing: ABD Pad, 8x10 1 x Per Week/ Discharge Instructions: Apply over primary dressing as directed. Com pression Wrap: Urgo K2, (equivalent to a 4 layer) two layer compression system, regular 1 x Per Week/ Discharge Instructions: Apply Urgo K2 as directed (alternative to 4 layer compression). WOUND #9: - Lower Leg Wound Laterality: Left, Lateral Cleanser: Soap and Water 1 x Per Week/ Discharge Instructions: May shower and wash wound with dial antibacterial soap and water prior to dressing change. Cleanser: Wound Cleanser 1 x Per Week/ Discharge Instructions: Cleanse the wound with wound cleanser prior to applying a clean dressing using gauze sponges, not tissue or cotton balls. Peri-Wound Care: Sween Lotion (Moisturizing lotion) 1 x Per Week/ Discharge Instructions: Apply moisturizing lotion as directed Topical: Gentamicin 1 x Per Week/ Discharge Instructions: As directed by physician Topical: Mupirocin Ointment 1 x Per Week/ Discharge Instructions: Apply Mupirocin (Bactroban) as instructed Prim Dressing: Maxorb Extra Ag+ Alginate Dressing, 4x4.75 (in/in) 1 x Per Week/ ary Discharge Instructions: Apply to wound bed as instructed Secondary Dressing: ABD Pad, 8x10 1 x Per Week/ Discharge Instructions: Apply over primary dressing as directed. Com pression Wrap: Urgo K2, (equivalent to a 4 layer) two layer compression system, regular 1 x Per Week/ Discharge Instructions: Apply Urgo K2 as directed (alternative to 4 layer compression). 06/18/2023: The wounds on his left have deteriorated and he has developed new wounds, as well. These all appear to have started as blisters, despite being in compression. His wife says that his  dressings were saturated so she took them off and patched things together with ABD pads and Kerlix and had him wearing his juxta lite stockings. He has opened up wounds on his right leg, as well, along with a blister on his posterior ankle just above the calcaneus. He has anasarca with pitting edema on his abdomen and his blood pressure was low today, although he was asymptomatic. I used scissors and forceps to trim away the loose skin from the ruptured blisters and also unroofed the blister on his right posterior ankle. I then used a curette to debride slough and subcutaneous tissue from all of his wounds. I am going to add topical gentamicin and mupirocin to the open wound sites, given the fairly significant surface area. We will continue silver alginate. I am going to increase his degree of compression from 3 layer equivalent to 4-layer [place], [name]  W ([id number]) [id number] Page 17 of 20 equivalent. I have asked his wife to please contact his primary care provider regarding his fluid management; I suspect he may require hospitalization for a CHF exacerbation. I also sent his PCP a staff message through the electronic medical record. We will plan on seeing him back in 1 week's time, unless he is hospitalized. Electronic Signature(s) Signed: 06/18/2023 12:35:03 PM By: Duanne Guess MD FACS Signed: 06/18/2023 4:14:29 PM By: Samuella Bruin Previous Signature: 06/18/2023 11:00:23 AM Version By: Duanne Guess MD FACS Entered By: Samuella Bruin on 06/18/2023 08:15:48 -------------------------------------------------------------------------------- HxROS Details Patient Name: Date of Service: [name]. 06/18/2023 10:00 A M Medical Record Number: [id number] Patient Account Number: [id number] Date of Birth/Sex: Treating RN: [birth year] (67 y.o. M) Primary Care Provider: Tresa Garter Other Clinician: Referring Provider: Treating  Provider/Extender: Raiford Simmonds in Treatment: 1 Information Obtained From Patient Constitutional Symptoms (General Health) Medical History: Past Medical History Notes: obesity Eyes Medical History: Positive for: Cataracts - bil removed Negative for: Glaucoma; Optic Neuritis Ear/Nose/Mouth/Throat Medical History: Negative for: Chronic sinus problems/congestion; Middle ear problems Hematologic/Lymphatic Medical History: Positive for: Lymphedema Negative for: Anemia; Hemophilia; Human Immunodeficiency Virus; Sickle Cell Disease Past Medical History Notes: heparin induced thrombocytopenia, blood dyscrasia Respiratory Medical History: Positive for: Chronic Obstructive Pulmonary Disease (COPD); Sleep Apnea Negative for: Aspiration; Asthma; Pneumothorax; Tuberculosis Past Medical History Notes: O2 dependent Cardiovascular Medical History: Positive for: Angina - none in 10 years; Arrhythmia - fluttering; Congestive Heart Failure; Coronary Artery Disease; Hypertension; Myocardial Infarction; Peripheral Venous Disease Negative for: Deep Vein Thrombosis; Hypotension; Peripheral Arterial Disease; Phlebitis; Vasculitis Past Medical History Notes: hyperlipidemia, heart murmur, carotid artery stenosis, vasculitis, mediastinal adenopathy Gastrointestinal Medical History: Negative for: Cirrhosis ; Colitis; Crohns; Hepatitis A; Hepatitis B; Hepatitis C Past Medical History Notes: GERD TEVYN, CODD ([id number]) [id number].pdf Page 18 of 20 Endocrine Medical History: Negative for: Type I Diabetes; Type II Diabetes Genitourinary Medical History: Negative for: End Stage Renal Disease Past Medical History Notes: CKD stage lll Immunological Medical History: Negative for: Lupus Erythematosus; Raynauds; Scleroderma Integumentary (Skin) Medical History: Negative for: History of Burn Past Medical History Notes: 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 Musculoskeletal Medical History: Positive for: Osteoarthritis Negative for: Gout; Rheumatoid Arthritis; Osteomyelitis Neurologic Medical History: Positive for: Neuropathy - toes Negative for: Dementia; Quadriplegia; Paraplegia; Seizure Disorder Past Medical History Notes: chronic lower back pain Oncologic Medical History: Positive for: Received Chemotherapy Negative for: Received Radiation Past Medical History Notes: basal cell carcinoma removed from nose, lymphoma Psychiatric Medical History: Negative for: Anorexia/bulimia; Confinement Anxiety Past Medical History Notes: depression HBO Extended History Items Eyes: Cataracts Immunizations Pneumococcal Vaccine: Received Pneumococcal Vaccination: Yes Received Pneumococcal Vaccination On or After 60th Birthday: Yes Implantable Devices No devices added Hospitalization / Surgery History Type of Hospitalization/Surgery left carotid endarterectomy left neck hematoma removed left neck coronary angioplasty with stents x2 CABG x3 vessel portacath placement cardiac cath polypectomy Family and Social History Cancer: Yes - Mother; Diabetes: No; Heart Disease: Yes - Father; Hereditary Spherocytosis: No; Hypertension: Yes - Father; Kidney Disease: No; Lung Disease: No; Seizures: No; Stroke: No; Thyroid Problems: No; Tuberculosis: No; Current some day smoker; Marital Status - Married; Alcohol Use: Never; [facility], [facility] ([id number]) [phone].pdf Page 19 of 20 Drug Use: No History; Caffeine Use: Daily; Financial Concerns: No; Food, Clothing or Shelter Needs: No; Support System Lacking: No; Transportation Concerns: No Electronic Signature(s) Signed: 06/18/2023 12:35:03 PM By: Duanne Guess MD FACS Entered By: Duanne Guess  on 06/18/2023 07:57:24 -------------------------------------------------------------------------------- SuperBill Details Patient Name: Date of  Service: JEMUEL, LAURSEN 06/18/2023 Medical Record Number: [id number] Patient Account Number: [id number] Date of Birth/Sex: Treating RN: 1955-12-02 (67 y.o. M) Primary Care Provider: Tresa Garter Other Clinician: Referring Provider: Treating Provider/Extender: Raiford Simmonds in Treatment: 1 Diagnosis Coding ICD-10 Codes Code Description [phone] Non-pressure chronic ulcer of other part of left lower leg with fat layer exposed L97.812 Non-pressure chronic ulcer of other part of right lower leg with fat layer exposed I87.2 Venous insufficiency (chronic) (peripheral) E11.622 Type 2 diabetes mellitus with other skin ulcer I89.0 Lymphedema, not elsewhere classified Facility Procedures : CPT4 Code: [id number] Description: 11042 - DEB SUBQ TISSUE 20 SQ CM/< ICD-10 Diagnosis Description L97.822 Non-pressure chronic ulcer of other part of left lower leg with fat layer expo L97.812 Non-pressure chronic ulcer of other part of right lower leg with fat layer exp Modifier: sed osed Quantity: 1 : CPT4 Code: [id number] Description: 11045 - DEB SUBQ TISS EA ADDL 20CM ICD-10 Diagnosis Description L97.822 Non-pressure chronic ulcer of other part of left lower leg with fat layer expo L97.812 Non-pressure chronic ulcer of other part of right lower leg with fat layer exp Modifier: sed osed Quantity: 6 Physician Procedures : CPT4 Code Description Modifier [id number] 99214 - WC PHYS LEVEL 4 - EST PT ICD-10 Diagnosis Description L97.822 Non-pressure chronic ulcer of other part of left lower leg with fat layer exposed L97.812 Non-pressure chronic ulcer of other part of right  lower leg with fat layer exposed I87.2 Venous insufficiency (chronic) (peripheral) E11.622 Type 2 diabetes mellitus with other skin ulcer Quantity: 1 : [id number] 11042 - WC PHYS SUBQ TISS 20 SQ CM ICD-10 Diagnosis Description L97.822 Non-pressure chronic ulcer of other part of left lower leg with fat layer  exposed L97.812 Non-pressure chronic ulcer of other part of right lower leg with fat layer exposed Quantity: 1 : [id number] 11045 - WC PHYS SUBQ TISS EA ADDL 20 CM ICD-10 Diagnosis Description L97.822 Non-pressure chronic ulcer of other part of left lower leg with fat layer exposed L97.812 Non-pressure chronic ulcer of other part of right lower leg with fat layer  exposed Quantity: 6 Electronic Signature(s) Signed: 06/18/2023 11:00:48 AM By: Duanne Guess MD FACS [place], Robert Bellow ([id number]) AM By: Duanne Guess MD FACS [phone].pdf Page 20 of 20 Signed: 06/18/2023 11:00:48 Entered By: Duanne Guess on 06/18/2023 08:00:48

## 2023-06-18 NOTE — Progress Notes (Signed)
HANFORD, LUST ([id number]) [id number] Page 1 of 21 Visit Report for 06/18/2023 Arrival Information Details Patient Name: Date of Service: [name], [name]. 06/18/2023 10:00 A M Medical Record Number: [id number] Patient Account Number: [id number] Date of Birth/Sex: Treating RN: [birth year] (67 y.o. [name] Primary Care Song Myre: Tresa Garter Other Clinician: Referring Francine Hannan: Treating Yaminah Clayborn/Extender: Raiford Simmonds in Treatment: 1 Visit Information History Since Last Visit Added or deleted any medications: No Patient Arrived: Ambulatory Any new allergies or adverse reactions: No Arrival Time: 10:01 Had a fall or experienced change in No Accompanied By: self activities of daily living that may affect Transfer Assistance: None risk of falls: Patient Identification Verified: Yes Signs or symptoms of abuse/neglect since last visito No Secondary Verification Process Completed: Yes Hospitalized since last visit: No Implantable device outside of the clinic excluding No cellular tissue based products placed in the center since last visit: Has Dressing in Place as Prescribed: Yes Has Compression in Place as Prescribed: Yes Pain Present Now: No Electronic Signature(s) Signed: 06/18/2023 4:14:29 PM By: Samuella Bruin Entered By: Samuella Bruin on 06/18/2023 07:05:32 -------------------------------------------------------------------------------- Compression Therapy Details Patient Name: Date of Service: [name]. 06/18/2023 10:00 A M Medical Record Number: [id number] Patient Account Number: [id number] Date of Birth/Sex: Treating RN: [birth year] (67 y.o. [name] Primary Care Carrel Leather: Tresa Garter Other Clinician: Referring Akaysha Cobern: Treating Jalise Zawistowski/Extender: Raiford Simmonds in Treatment: 1 Compression Therapy Performed for Wound  Assessment: Wound #10 Right,Lateral Lower Leg Performed By: Clinician Samuella Bruin, RN Compression Type: Double Layer Post Procedure Diagnosis Same as Pre-procedure Electronic Signature(s) Signed: 06/18/2023 4:14:29 PM By: Gelene Mink By: Samuella Bruin on 06/18/2023 08:15:18 Delphina Cahill ([id number]) [id number] Page 2 of 21 -------------------------------------------------------------------------------- Compression Therapy Details Patient Name: Date of Service: [name], [name]. 06/18/2023 10:00 A M Medical Record Number: [id number] Patient Account Number: [id number] Date of Birth/Sex: Treating RN: 02-Jul-1956 (67 y.o. [name] Primary Care Lauryl Seyer: Tresa Garter Other Clinician: Referring Aliena Ghrist: Treating Noralyn Karim/Extender: Raiford Simmonds in Treatment: 1 Compression Therapy Performed for Wound Assessment: Wound #7 Left,Anterior Ankle Performed By: Clinician Samuella Bruin, RN Compression Type: Double Layer Post Procedure Diagnosis Same as Pre-procedure Electronic Signature(s) Signed: 06/18/2023 4:14:29 PM By: Gelene Mink By: Samuella Bruin on 06/18/2023 08:15:18 -------------------------------------------------------------------------------- Encounter Discharge Information Details Patient Name: Date of Service: [name]. 06/18/2023 10:00 A M Medical Record Number: [id number] Patient Account Number: [id number] Date of Birth/Sex: Treating RN: [birth year] (67 y.o. [name] Primary Care Rosi Secrist: Tresa Garter Other Clinician: Referring Jasmyn Picha: Treating Eliab Closson/Extender: Raiford Simmonds in Treatment: 1 Encounter Discharge Information Items Post Procedure Vitals Discharge Condition: Stable Temperature (F): 97.7 Ambulatory Status: Wheelchair Pulse (bpm): 72 Discharge Destination:  Home Respiratory Rate (breaths/min): 22 Transportation: Private Auto Blood Pressure (mmHg): 82/48 Accompanied By: wife Schedule Follow-up Appointment: Yes Clinical Summary of Care: Patient Declined Notes patient and wife informed they need to call the patient's primary doctor or go to the ER Electronic Signature(s) Signed: 06/18/2023 4:14:29 PM By: Samuella Bruin Entered By: Samuella Bruin on 06/18/2023 08:17:51 -------------------------------------------------------------------------------- Lower Extremity Assessment Details Patient Name: Date of Service: [name]. 06/18/2023 10:00 A M Medical Record Number: [id number] Patient Account Number: [id number] Date of Birth/Sex: Treating RN: 20-Sep-1955 (67 y.o. [name] Primary Care Rashod Gougeon: Tresa Garter Other Clinician: Referring Kimra Kantor: Treating Jessyka Austria/Extender: Cherlynn Kaiser  V Weeks in Treatment: 1 Edema Assessment Assessed: [Left: No] [Right: No] Edema: [Left: Yes] [Right: Yes] S[LeftANANTH, FIALLOS ([id number]) [phone] [Right: [id number] Page 3 of 21] Calf Left: Right: Point of Measurement: 36 cm From Medial Instep 44 cm 43.2 cm Ankle Left: Right: Point of Measurement: 13 cm From Medial Instep 32 cm 33 cm Vascular Assessment Pulses: Dorsalis Pedis Palpable: [Left:Yes] [Right:Yes] Extremity colors, hair growth, and conditions: Extremity Color: [Left:Hyperpigmented] [Right:Hyperpigmented] Hair Growth on Extremity: [Left:Yes] [Right:Yes] Temperature of Extremity: [Left:Warm] [Right:Warm] Capillary Refill: [Left:< 3 seconds] [Right:< 3 seconds] Dependent Rubor: [Left:No No] [Right:No No] Electronic Signature(s) Signed: 06/18/2023 4:14:29 PM By: Samuella Bruin Entered By: Samuella Bruin on 06/18/2023 07:16:28 -------------------------------------------------------------------------------- Multi Wound Chart Details Patient Name: Date of  Service: [name]. 06/18/2023 10:00 A M Medical Record Number: [id number] Patient Account Number: [id number] Date of Birth/Sex: Treating RN: 1956-04-05 (67 y.o. M) Primary Care Cherokee Boccio: Tresa Garter Other Clinician: Referring Jaydyn Menon: Treating Shem Plemmons/Extender: Raiford Simmonds in Treatment: 1 Vital Signs Height(in): 72 Pulse(bpm): 72 Weight(lbs): 252 Blood Pressure(mmHg): 82/48 Body Mass Index(BMI): 34.2 Temperature(F): 97.7 Respiratory Rate(breaths/min): 22 [10:Photos:] [12:No Photos] Right, Lateral Lower Leg Right, Anterior Lower Leg Right, Lateral Ankle Wound Location: Blister Blister Blister Wounding Event: Lymphedema Lymphedema Lymphedema Primary Etiology: Cataracts, Lymphedema, Chronic Cataracts, Lymphedema, Chronic Cataracts, Lymphedema, Chronic Comorbid History: Obstructive Pulmonary Disease Obstructive Pulmonary Disease Obstructive Pulmonary Disease (COPD), Sleep Apnea, Angina, (COPD), Sleep Apnea, Angina, (COPD), Sleep Apnea, Angina, Arrhythmia, Congestive Heart Failure, Arrhythmia, Congestive Heart Failure, Arrhythmia, Congestive Heart Failure, Coronary Artery Disease, Coronary Artery Disease, Coronary Artery Disease, Hypertension, Myocardial Infarction, Hypertension, Myocardial Infarction, Hypertension, Myocardial Infarction, Peripheral Venous Disease, Peripheral Venous Disease, Peripheral Venous Disease, Osteoarthritis, Neuropathy, Received Osteoarthritis, Neuropathy, Received Osteoarthritis, Neuropathy, Received Chemotherapy Chemotherapy Chemotherapy 06/15/2023 06/15/2023 06/18/2023 Date Acquired: [name], [name] ([id number]) [id number] Page 4 of 21 0 0 0 Weeks of Treatment: Open Open Open Wound Status: No No No Wound Recurrence: Yes No No Clustered Wound: 3 N/A N/A Clustered Quantity: 4x6.5x0.1 4x6.5x0.1 1x1x0.1 Measurements L x W x D (cm) 20.42 20.42 0.785 A (cm) : rea 2.042  2.042 0.079 Volume (cm) : N/A N/A N/A % Reduction in Area: N/A N/A N/A % Reduction in Volume: Full Thickness Without Exposed Full Thickness Without Exposed Full Thickness Without Exposed Classification: Support Structures Support Structures Support Structures Medium Medium Medium Exudate A mount: Serous Serous Serous Exudate Type: Psychologist, forensic Exudate Color: Flat and Intact Flat and Intact Distinct, outline attached Wound Margin: Large (67-100%) Medium (34-66%) Large (67-100%) Granulation A mount: Red Red Red Granulation Quality: Small (1-33%) Medium (34-66%) Small (1-33%) Necrotic A mount: Fat Layer (Subcutaneous Tissue): Yes Fat Layer (Subcutaneous Tissue): Yes Fat Layer (Subcutaneous Tissue): Yes Exposed Structures: Fascia: No Fascia: No Fascia: No Tendon: No Tendon: No Tendon: No Muscle: No Muscle: No Muscle: No Joint: No Joint: No Joint: No Bone: No Bone: No Bone: No None Small (1-33%) None Epithelialization: Debridement - Excisional N/A Debridement - Excisional Debridement: Pre-procedure Verification/Time Out 10:36 N/A 10:36 Taken: Lidocaine 4% Topical Solution N/A Lidocaine 4% Topical Solution Pain Control: Subcutaneous, Slough N/A Subcutaneous, Slough Tissue Debrided: Skin/Subcutaneous Tissue N/A Skin/Subcutaneous Tissue Level: 12.25 N/A 0.78 Debridement A (sq cm): rea Curette N/A Curette, Forceps, Scissors Instrument: Minimum N/A Minimum Bleeding: Pressure N/A Pressure Hemostasis A chieved: Procedure was tolerated well N/A Procedure was tolerated well Debridement Treatment Response: 4.8x6.5x0.1 N/A 1x1x0.1 Post Debridement Measurements L x W x D (cm) 2.45 N/A 0.079 Post Debridement Volume: (cm) No Abnormalities Noted  No Abnormalities Noted No Abnormalities Noted Periwound Skin Texture: No Abnormalities Noted No Abnormalities Noted No Abnormalities Noted Periwound Skin Moisture: Hemosiderin Staining: Yes Hemosiderin Staining:  Yes Hemosiderin Staining: Yes Periwound Skin Color: No Abnormality No Abnormality No Abnormality Temperature: Debridement N/A Debridement Procedures Performed: Wound Number: [id number] 7  Photos: Left, Anterior Lower Leg Left, Medial Lower Leg Left, Anterior Ankle Wound Location: Blister Blister Blister Wounding Event: Lymphedema Lymphedema Lymphedema Primary Etiology: Cataracts, Lymphedema, Chronic Cataracts, Lymphedema, Chronic Cataracts, Lymphedema, Chronic Comorbid History: Obstructive Pulmonary Disease Obstructive Pulmonary Disease Obstructive Pulmonary Disease (COPD), Sleep Apnea, Angina, (COPD), Sleep Apnea, Angina, (COPD), Sleep Apnea, Angina, Arrhythmia, Congestive Heart Failure, Arrhythmia, Congestive Heart Failure, Arrhythmia, Congestive Heart Failure, Coronary Artery Disease, Coronary Artery Disease, Coronary Artery Disease, Hypertension, Myocardial Infarction, Hypertension, Myocardial Infarction, Hypertension, Myocardial Infarction, Peripheral Venous Disease, Peripheral Venous Disease, Peripheral Venous Disease, Osteoarthritis, Neuropathy, Received Osteoarthritis, Neuropathy, Received Osteoarthritis, Neuropathy, Received Chemotherapy Chemotherapy Chemotherapy 01/29/2023 01/29/2023 06/15/2023 Date Acquired: 1 1 0 Weeks of Treatment: Open Open Open Wound Status: No No No Wound Recurrence: No No No Clustered Wound: N/A N/A N/A Clustered Quantity: 2.5x2.3x0.1 4.8x5.7x0.1 5.2x12.5x0.1 Measurements L x W x D (cm) 4.516 21.488 51.051 A (cm) : rea 0.452 2.149 5.105 Volume (cm) : 76.90% -5599.70% N/A % Reduction in Area: 76.90% -5555.30% N/A % Reduction in Volume: Full Thickness Without Exposed Full Thickness Without Exposed Full Thickness Without Exposed Classification: Support Structures Support Structures Support Structures Medium Medium Medium Exudate Amount: Serous Serous Serous Exudate Type: Psychologist, forensic Exudate Color: Flat and Intact Flat and Intact Flat  and Intact Wound MarginAZZAM, [name] ([id number]) [id number] Page 5 of 21 Large (67-100%) Large (67-100%) Large (67-100%) Granulation Amount: Red Red Red Granulation Quality: None Present (0%) None Present (0%) None Present (0%) Necrotic Amount: Fat Layer (Subcutaneous Tissue): Yes Fat Layer (Subcutaneous Tissue): Yes Fat Layer (Subcutaneous Tissue): Yes Exposed Structures: Fascia: No Fascia: No Fascia: No Tendon: No Tendon: No Tendon: No Muscle: No Muscle: No Muscle: No Joint: No Joint: No Joint: No Bone: No Bone: No Bone: No Medium (34-66%) None None Epithelialization: Debridement - Excisional Debridement - Excisional Debridement - Excisional Debridement: Pre-procedure Verification/Time Out 10:36 10:36 10:36 Taken: Lidocaine 4% Topical Solution Lidocaine 4% Topical Solution Lidocaine 4% Topical Solution Pain Control: Subcutaneous, Slough Subcutaneous, Slough Subcutaneous, Slough Tissue Debrided: Skin/Subcutaneous Tissue Skin/Subcutaneous Tissue Skin/Subcutaneous Tissue Level: 4.51 21.48 51.02 Debridement A (sq cm): rea Curette, Forceps, Scissors Curette, Forceps, Scissors Curette, Forceps, Scissors Instrument: Minimum Minimum Minimum Bleeding: Pressure Pressure Pressure Hemostasis A chieved: Procedure was tolerated well Procedure was tolerated well Procedure was tolerated well Debridement Treatment Response: 2.5x2.3x0.1 4.8x5.7x0.1 5.2x12.5x0.1 Post Debridement Measurements L x W x D (cm) 0.452 2.149 5.105 Post Debridement Volume: (cm) No Abnormalities Noted No Abnormalities Noted No Abnormalities Noted Periwound Skin Texture: No Abnormalities Noted No Abnormalities Noted No Abnormalities Noted Periwound Skin Moisture: Hemosiderin Staining: Yes Hemosiderin Staining: Yes Hemosiderin Staining: Yes Periwound Skin Color: No Abnormality No Abnormality No Abnormality Temperature: Yes Yes N/A Tenderness on  Palpation: Debridement Debridement Debridement Procedures Performed: Wound Number: 8 9 N/A Photos: N/A Left, Distal, Anterior Lower Leg Left, Lateral Lower Leg N/A Wound Location: Blister Blister N/A Wounding Event: Lymphedema Lymphedema N/A Primary Etiology: Cataracts, Lymphedema, Chronic Cataracts, Lymphedema, Chronic N/A Comorbid History: Obstructive Pulmonary Disease Obstructive Pulmonary Disease (COPD), Sleep Apnea, Angina, (COPD), Sleep Apnea, Angina, Arrhythmia, Congestive Heart Failure, Arrhythmia, Congestive Heart Failure, Coronary Artery Disease, Coronary Artery Disease, Hypertension, Myocardial Infarction, Hypertension, Myocardial Infarction, Peripheral Venous Disease, Peripheral Venous Disease, Osteoarthritis, Neuropathy,  Received Osteoarthritis, Neuropathy, Received Chemotherapy Chemotherapy 06/15/2023 06/15/2023 N/A Date Acquired: 0 0 N/A Weeks of Treatment: Open Open N/A Wound Status: No No N/A Wound Recurrence: No No N/A Clustered Wound: N/A N/A N/A Clustered Quantity: 6.5x9x0.1 1.2x0.8x0.1 N/A Measurements L x W x D (cm) 45.946 0.754 N/A A (cm) : rea 4.595 0.075 N/A Volume (cm) : N/A N/A N/A % Reduction in A rea: N/A N/A N/A % Reduction in Volume: Full Thickness Without Exposed Full Thickness Without Exposed N/A Classification: Support Structures Support Structures Medium Medium N/A Exudate A mount: Serous Serous N/A Exudate Type: Media planner N/A Exudate Color: Flat and Intact Distinct, outline attached N/A Wound Margin: Large (67-100%) Large (67-100%) N/A Granulation A mount: Red Red N/A Granulation Quality: None Present (0%) None Present (0%) N/A Necrotic A mount: Fat Layer (Subcutaneous Tissue): Yes Fat Layer (Subcutaneous Tissue): Yes N/A Exposed Structures: Fascia: No Fascia: No Tendon: No Tendon: No Muscle: No Muscle: No Joint: No Joint: No Bone: No Bone: No None None N/A Epithelialization: Debridement - Excisional  Debridement - Excisional N/A Debridement: Pre-procedure Verification/Time Out 10:36 10:36 N/A Taken: Lidocaine 4% Topical Solution Lidocaine 4% Topical Solution N/A Pain Control: Subcutaneous, Slough Subcutaneous, Slough N/A Tissue Debrided: [name], [name] ([id number]) [phone].pdf Page 6 of 21 Skin/Subcutaneous Tissue Skin/Subcutaneous Tissue N/A Level: 45.92 0.75 N/A Debridement A (sq cm): rea Curette, Forceps, Scissors Curette, Forceps, Scissors N/A Instrument: Minimum Minimum N/A Bleeding: Pressure Pressure N/A Hemostasis A chieved: Procedure was tolerated well Procedure was tolerated well N/A Debridement Treatment Response: 6.5x9x0.1 1.2x0.8x0.1 N/A Post Debridement Measurements L x W x D (cm) 4.595 0.075 N/A Post Debridement Volume: (cm) No Abnormalities Noted No Abnormalities Noted N/A Periwound Skin Texture: No Abnormalities Noted No Abnormalities Noted N/A Periwound Skin Moisture: Hemosiderin Staining: Yes Hemosiderin Staining: Yes N/A Periwound Skin Color: No Abnormality No Abnormality N/A Temperature: Debridement Debridement N/A Procedures Performed: Treatment Notes Electronic Signature(s) Signed: 06/18/2023 10:54:58 AM By: Duanne Guess MD FACS Previous Signature: 06/18/2023 10:20:02 AM Version By: Duanne Guess MD FACS Entered By: Duanne Guess on 06/18/2023 07:54:57 -------------------------------------------------------------------------------- Multi-Disciplinary Care Plan Details Patient Name: Date of Service: [name]. 06/18/2023 10:00 A M Medical Record Number: [id number] Patient Account Number: [id number] Date of Birth/Sex: Treating RN: 07-23-1956 (67 y.o. [name] Primary Care Lamiya Naas: Tresa Garter Other Clinician: Referring Jaxon Flatt: Treating Jacqui Headen/Extender: Raiford Simmonds in Treatment: 1 Multidisciplinary Care Plan reviewed with physician Active  Inactive Abuse / Safety / Falls / Self Care Management Nursing Diagnoses: Impaired physical mobility Potential for falls Goals: Patient/caregiver will verbalize/demonstrate measures taken to prevent injury and/or falls Date Initiated: 06/07/2023 Target Resolution Date: 07/05/2023 Goal Status: Active Interventions: Assess fall risk on admission and as needed Assess impairment of mobility on admission and as needed per policy Notes: Venous Leg Ulcer Nursing Diagnoses: Actual venous Insuffiency (use after diagnosis is confirmed) Knowledge deficit related to disease process and management Goals: Patient will maintain optimal edema control Date Initiated: 06/07/2023 Target Resolution Date: 07/05/2023 Goal Status: Active Interventions: Assess peripheral edema status every visit. Compression as ordered Provide education on venous insufficiency GEVORK, AYYAD ([id number]) [phone].pdf Page 7 of 21 Treatment Activities: Therapeutic compression applied : 06/07/2023 Notes: Wound/Skin Impairment Nursing Diagnoses: Impaired tissue integrity Knowledge deficit related to smoking impact on wound healing Knowledge deficit related to ulceration/compromised skin integrity Goals: Patient will demonstrate a reduced rate of smoking or cessation of smoking Date Initiated: 06/07/2023 Target Resolution Date: 07/05/2023 Goal Status: Active Patient/caregiver will verbalize understanding of  skin care regimen Date Initiated: 06/07/2023 Target Resolution Date: 07/05/2023 Goal Status: Active Ulcer/skin breakdown will have a volume reduction of 30% by week 4 Date Initiated: 06/07/2023 Target Resolution Date: 07/05/2023 Goal Status: Active Interventions: Assess patient/caregiver ability to obtain necessary supplies Assess patient/caregiver ability to perform ulcer/skin care regimen upon admission and as needed Assess ulceration(s) every visit Provide education on ulcer  and skin care Treatment Activities: Skin care regimen initiated : 06/07/2023 Topical wound management initiated : 06/07/2023 Notes: Electronic Signature(s) Signed: 06/18/2023 4:14:29 PM By: Samuella Bruin Entered By: Samuella Bruin on 06/18/2023 07:36:08 -------------------------------------------------------------------------------- Pain Assessment Details Patient Name: Date of Service: [name]. 06/18/2023 10:00 A M Medical Record Number: [id number] Patient Account Number: [id number] Date of Birth/Sex: Treating RN: 03-31-1956 (67 y.o. [name] Primary Care Lakiah Dhingra: Tresa Garter Other Clinician: Referring Tida Saner: Treating Jennifr Gaeta/Extender: Raiford Simmonds in Treatment: 1 Active Problems Location of Pain Severity and Description of Pain Patient Has Paino No Site Locations Rate the pain. [name], [name] ([id number]) [id number] Page 8 of 21 Rate the pain. Current Pain Level: 0 Pain Management and Medication Current Pain Management: Electronic Signature(s) Signed: 06/18/2023 4:14:29 PM By: Samuella Bruin Entered By: Samuella Bruin on 06/18/2023 07:06:47 -------------------------------------------------------------------------------- Patient/Caregiver Education Details Patient Name: Date of Service: Sarnowski, DO UGLA S W. 11/4/2024andnbsp10:00 A M Medical Record Number: [id number] Patient Account Number: [id number] Date of Birth/Gender: Treating RN: Nov 04, 1955 (67 y.o. [name] Primary Care Physician: Tresa Garter Other Clinician: Referring Physician: Treating Physician/Extender: Raiford Simmonds in Treatment: 1 Education Assessment Education Provided To: Patient Education Topics Provided Wound/Skin Impairment: Methods: Explain/Verbal Responses: Reinforcements needed, State content correctly Electronic Signature(s) Signed:  06/18/2023 4:14:29 PM By: Samuella Bruin Entered By: Samuella Bruin on 06/18/2023 07:36:18 -------------------------------------------------------------------------------- Wound Assessment Details Patient Name: Date of Service: [name]. 06/18/2023 10:00 A M Medical Record Number: [id number] Patient Account Number: [id number] Date of Birth/Sex: Treating RN: 1956/08/11 (67 y.o. [name] Primary Care Josiel Gahm: Tresa Garter Other Clinician: Referring Solace Manwarren: Treating Linh Johannes/Extender: Tarek, Cravens, Robert Bellow ([id number]) [id number] Page 9 of 21 Weeks in Treatment: 1 Wound Status Wound Number: 10 Primary Lymphedema Etiology: Wound Location: Right, Lateral Lower Leg Wound Open Wounding Event: Blister Status: Date Acquired: 06/15/2023 Comorbid Cataracts, Lymphedema, Chronic Obstructive Pulmonary Disease Weeks Of Treatment: 0 History: (COPD), Sleep Apnea, Angina, Arrhythmia, Congestive Heart Clustered Wound: Yes Failure, Coronary Artery Disease, Hypertension, Myocardial Infarction, Peripheral Venous Disease, Osteoarthritis, Neuropathy, Received Chemotherapy Photos Wound Measurements Length: (cm) Width: (cm) Depth: (cm) Clustered Quantity: Area: (cm) Volume: (cm) 4 % Reduction in Area: 6.5 % Reduction in Volume: 0.1 Epithelialization: None 3 Tunneling: No 20.42 Undermining: No 2.042 Wound Description Classification: Full Thickness Without Exposed Supp Wound Margin: Flat and Intact Exudate Amount: Medium Exudate Type: Serous Exudate Color: amber ort Structures Foul Odor After Cleansing: No Slough/Fibrino Yes Wound Bed Granulation Amount: Large (67-100%) Exposed Structure Granulation Quality: Red Fascia Exposed: No Necrotic Amount: Small (1-33%) Fat Layer (Subcutaneous Tissue) Exposed: Yes Necrotic Quality: Adherent Slough Tendon Exposed: No Muscle Exposed: No Joint Exposed:  No Bone Exposed: No Periwound Skin Texture Texture Color No Abnormalities Noted: Yes No Abnormalities Noted: No Hemosiderin Staining: Yes Moisture No Abnormalities Noted: Yes Temperature / Pain Temperature: No Abnormality Treatment Notes Wound #10 (Lower Leg) Wound Laterality: Right, Lateral Cleanser Soap and Water Discharge Instruction: May shower and wash wound with dial antibacterial soap and water prior to dressing change. Wound  Cleanser Discharge Instruction: Cleanse the wound with wound cleanser prior to applying a clean dressing using gauze sponges, not tissue or cotton balls. Peri-Wound Care Sween Lotion (Moisturizing lotion) Discharge Instruction: Apply moisturizing lotion as directed Topical KAIDAN, HARPSTER ([id number]) [id number] Page 10 of 21 Gentamicin Discharge Instruction: As directed by physician Mupirocin Ointment Discharge Instruction: Apply Mupirocin (Bactroban) as instructed Primary Dressing Maxorb Extra Ag+ Alginate Dressing, 4x4.75 (in/in) Discharge Instruction: Apply to wound bed as instructed Secondary Dressing ABD Pad, 8x10 Discharge Instruction: Apply over primary dressing as directed. Secured With Compression Wrap Urgo K2, (equivalent to a 4 layer) two layer compression system, regular Discharge Instruction: Apply Urgo K2 as directed (alternative to 4 layer compression). Compression Stockings Add-Ons Electronic Signature(s) Signed: 06/18/2023 4:14:29 PM By: Samuella Bruin Entered By: Samuella Bruin on 06/18/2023 07:29:19 -------------------------------------------------------------------------------- Wound Assessment Details Patient Name: Date of Service: [name]. 06/18/2023 10:00 A M Medical Record Number: [id number] Patient Account Number: [id number] Date of Birth/Sex: Treating RN: 10-Dec-1955 (67 y.o. [name] Primary Care Lariya Kinzie: Tresa Garter Other Clinician: Referring  Walt Geathers: Treating Alejo Beamer/Extender: Raiford Simmonds in Treatment: 1 Wound Status Wound Number: 11 Primary Lymphedema Etiology: Wound Location: Right, Anterior Lower Leg Wound Open Wounding Event: Blister Status: Date Acquired: 06/15/2023 Comorbid Cataracts, Lymphedema, Chronic Obstructive Pulmonary Disease Weeks Of Treatment: 0 History: (COPD), Sleep Apnea, Angina, Arrhythmia, Congestive Heart Clustered Wound: No Failure, Coronary Artery Disease, Hypertension, Myocardial Infarction, Peripheral Venous Disease, Osteoarthritis, Neuropathy, Received Chemotherapy Photos Wound Measurements Length: (cm) 4 Width: (cm) 6.5 Depth: (cm) 0.1 Area: (cm) 20.42 Volume: (cm) 2.042 [name], [name] ([id number]) Wound Description Classification: Full Thickness Without Exposed Support Structures Wound Margin: Flat and Intact Exudate Amount: Medium Exudate Type: Serous Exudate Color: amber Foul Odor After Cleansing: No Slough/Fibrino Yes % Reduction in Area: % Reduction in Volume: Epithelialization: Small (1-33%) Tunneling: No Undermining: No [id number] Page 11 of 21 Wound Bed Granulation Amount: Medium (34-66%) Exposed Structure Granulation Quality: Red Fascia Exposed: No Necrotic Amount: Medium (34-66%) Fat Layer (Subcutaneous Tissue) Exposed: Yes Necrotic Quality: Adherent Slough Tendon Exposed: No Muscle Exposed: No Joint Exposed: No Bone Exposed: No Periwound Skin Texture Texture Color No Abnormalities Noted: Yes No Abnormalities Noted: No Hemosiderin Staining: Yes Moisture No Abnormalities Noted: Yes Temperature / Pain Temperature: No Abnormality Treatment Notes Wound #11 (Lower Leg) Wound Laterality: Right, Anterior Cleanser Soap and Water Discharge Instruction: May shower and wash wound with dial antibacterial soap and water prior to dressing change. Wound Cleanser Discharge Instruction: Cleanse the wound with  wound cleanser prior to applying a clean dressing using gauze sponges, not tissue or cotton balls. Peri-Wound Care Sween Lotion (Moisturizing lotion) Discharge Instruction: Apply moisturizing lotion as directed Topical Gentamicin Discharge Instruction: As directed by physician Mupirocin Ointment Discharge Instruction: Apply Mupirocin (Bactroban) as instructed Primary Dressing Maxorb Extra Ag+ Alginate Dressing, 4x4.75 (in/in) Discharge Instruction: Apply to wound bed as instructed Secondary Dressing ABD Pad, 8x10 Discharge Instruction: Apply over primary dressing as directed. Secured With Compression Wrap Urgo K2, (equivalent to a 4 layer) two layer compression system, regular Discharge Instruction: Apply Urgo K2 as directed (alternative to 4 layer compression). Compression Stockings Add-Ons Electronic Signature(s) Signed: 06/18/2023 4:14:29 PM By: Samuella Bruin Entered By: Samuella Bruin on 06/18/2023 07:29:43 Delphina Cahill ([id number]) [id number] Page 12 of 21 -------------------------------------------------------------------------------- Wound Assessment Details Patient Name: Date of Service: [name], [name]. 06/18/2023 10:00 A M Medical Record Number: [id number] Patient Account Number: [id number] Date of Birth/Sex: Treating RN: [birth year] (  67 y.o. [name] Primary Care Patricio Popwell: Tresa Garter Other Clinician: Referring Kieon Lawhorn: Treating Keevon Henney/Extender: Raiford Simmonds in Treatment: 1 Wound Status Wound Number: 12 Primary Lymphedema Etiology: Wound Location: Right, Lateral Ankle Wound Open Wounding Event: Blister Status: Date Acquired: 06/18/2023 Comorbid Cataracts, Lymphedema, Chronic Obstructive Pulmonary Disease Weeks Of Treatment: 0 History: (COPD), Sleep Apnea, Angina, Arrhythmia, Congestive Heart Clustered Wound: No Failure, Coronary Artery Disease, Hypertension,  Myocardial Infarction, Peripheral Venous Disease, Osteoarthritis, Neuropathy, Received Chemotherapy Wound Measurements Length: (cm) 1 Width: (cm) 1 Depth: (cm) 0.1 Area: (cm) 0.785 Volume: (cm) 0.079 % Reduction in Area: % Reduction in Volume: Epithelialization: None Tunneling: No Undermining: No Wound Description Classification: Full Thickness Without Exposed Support Structures Wound Margin: Distinct, outline attached Exudate Amount: Medium Exudate Type: Serous Exudate Color: amber Foul Odor After Cleansing: No Slough/Fibrino Yes Wound Bed Granulation Amount: Large (67-100%) Exposed Structure Granulation Quality: Red Fascia Exposed: No Necrotic Amount: Small (1-33%) Fat Layer (Subcutaneous Tissue) Exposed: Yes Necrotic Quality: Adherent Slough Tendon Exposed: No Muscle Exposed: No Joint Exposed: No Bone Exposed: No Periwound Skin Texture Texture Color No Abnormalities Noted: Yes No Abnormalities Noted: No Hemosiderin Staining: Yes Moisture No Abnormalities Noted: Yes Temperature / Pain Temperature: No Abnormality Treatment Notes Wound #12 (Ankle) Wound Laterality: Right, Lateral Cleanser Soap and Water Discharge Instruction: May shower and wash wound with dial antibacterial soap and water prior to dressing change. Wound Cleanser Discharge Instruction: Cleanse the wound with wound cleanser prior to applying a clean dressing using gauze sponges, not tissue or cotton balls. Peri-Wound Care Sween Lotion (Moisturizing lotion) Discharge Instruction: Apply moisturizing lotion as directed Topical Gentamicin Discharge Instruction: As directed by physician Mupirocin Ointment Discharge Instruction: Apply Mupirocin (Bactroban) as instructed Primary Dressing Maxorb Extra Ag+ Alginate Dressing, 4x4.75 (in/in) Discharge Instruction: Apply to wound bed as instructed ANTHONYMICHAEL, MUNDAY ([id number]) [id number] Page 13 of 21 Secondary Dressing ABD  Pad, 8x10 Discharge Instruction: Apply over primary dressing as directed. Secured With Compression Wrap Urgo K2, (equivalent to a 4 layer) two layer compression system, regular Discharge Instruction: Apply Urgo K2 as directed (alternative to 4 layer compression). Compression Stockings Add-Ons Electronic Signature(s) Signed: 06/18/2023 4:14:29 PM By: Samuella Bruin Entered By: Samuella Bruin on 06/18/2023 07:46:52 -------------------------------------------------------------------------------- Wound Assessment Details Patient Name: Date of Service: [name]. 06/18/2023 10:00 A M Medical Record Number: [id number] Patient Account Number: [id number] Date of Birth/Sex: Treating RN: 11/06/1955 (67 y.o. [name] Primary Care Damani Rando: Tresa Garter Other Clinician: Referring Madalynn Pickelsimer: Treating Leevi Cullars/Extender: Raiford Simmonds in Treatment: 1 Wound Status Wound Number: 5 Primary Lymphedema Etiology: Wound Location: Left, Anterior Lower Leg Wound Open Wounding Event: Blister Status: Date Acquired: 01/29/2023 Comorbid Cataracts, Lymphedema, Chronic Obstructive Pulmonary Disease Weeks Of Treatment: 1 History: (COPD), Sleep Apnea, Angina, Arrhythmia, Congestive Heart Clustered Wound: No Failure, Coronary Artery Disease, Hypertension, Myocardial Infarction, Peripheral Venous Disease, Osteoarthritis, Neuropathy, Received Chemotherapy Photos Wound Measurements Length: (cm) 2.5 Width: (cm) 2.3 Depth: (cm) 0.1 Area: (cm) 4.516 Volume: (cm) 0.452 % Reduction in Area: 76.9% % Reduction in Volume: 76.9% Epithelialization: Medium (34-66%) Tunneling: No Undermining: No Wound Description Classification: Full Thickness Without Exposed Support Structures Wound Margin: Flat and Intact Exudate Amount: Medium Exudate Type: Serous Exudate Color: amber [name], [name] ([id number]) Wound Bed Granulation Amount: Large  (67-100%) Granulation Quality: Red Necrotic Amount: None Present (0%) Foul Odor After Cleansing: No Slough/Fibrino No [id number] Page 14 of 21 Exposed Structure Fascia Exposed: No Fat Layer (Subcutaneous Tissue) Exposed:  Yes Tendon Exposed: No Muscle Exposed: No Joint Exposed: No Bone Exposed: No Periwound Skin Texture Texture Color No Abnormalities Noted: Yes No Abnormalities Noted: No Hemosiderin Staining: Yes Moisture No Abnormalities Noted: Yes Temperature / Pain Temperature: No Abnormality Tenderness on Palpation: Yes Treatment Notes Wound #5 (Lower Leg) Wound Laterality: Left, Anterior Cleanser Soap and Water Discharge Instruction: May shower and wash wound with dial antibacterial soap and water prior to dressing change. Wound Cleanser Discharge Instruction: Cleanse the wound with wound cleanser prior to applying a clean dressing using gauze sponges, not tissue or cotton balls. Peri-Wound Care Sween Lotion (Moisturizing lotion) Discharge Instruction: Apply moisturizing lotion as directed Topical Gentamicin Discharge Instruction: As directed by physician Mupirocin Ointment Discharge Instruction: Apply Mupirocin (Bactroban) as instructed Primary Dressing Maxorb Extra Ag+ Alginate Dressing, 4x4.75 (in/in) Discharge Instruction: Apply to wound bed as instructed Secondary Dressing ABD Pad, 8x10 Discharge Instruction: Apply over primary dressing as directed. Secured With Compression Wrap Urgo K2, (equivalent to a 4 layer) two layer compression system, regular Discharge Instruction: Apply Urgo K2 as directed (alternative to 4 layer compression). Compression Stockings Add-Ons Electronic Signature(s) Signed: 06/18/2023 4:14:29 PM By: Samuella Bruin Entered By: Samuella Bruin on 06/18/2023 07:27:27 -------------------------------------------------------------------------------- Wound Assessment Details Patient Name: Date of  Service: [name]. 06/18/2023 10:00 A M Medical Record Number: [id number] Patient Account Number: [id number] Date of Birth/Sex: Treating RN: 1956/06/03 (67 y.o. [name] Primary Care Perry Molla: Tresa Garter Other Clinician: Referring Katharina Jehle: Treating Iria Jamerson/Extender: Aramis, Weil, Robert Bellow ([id number]) [id number] Page 15 of 21 Weeks in Treatment: 1 Wound Status Wound Number: 6 Primary Lymphedema Etiology: Wound Location: Left, Medial Lower Leg Wound Open Wounding Event: Blister Status: Date Acquired: 01/29/2023 Comorbid Cataracts, Lymphedema, Chronic Obstructive Pulmonary Disease Weeks Of Treatment: 1 History: (COPD), Sleep Apnea, Angina, Arrhythmia, Congestive Heart Clustered Wound: No Failure, Coronary Artery Disease, Hypertension, Myocardial Infarction, Peripheral Venous Disease, Osteoarthritis, Neuropathy, Received Chemotherapy Photos Wound Measurements Length: (cm) 4.8 Width: (cm) 5.7 Depth: (cm) 0.1 Area: (cm) 21.488 Volume: (cm) 2.149 % Reduction in Area: -5599.7% % Reduction in Volume: -5555.3% Epithelialization: None Tunneling: No Undermining: No Wound Description Classification: Full Thickness Without Exposed Support Structures Wound Margin: Flat and Intact Exudate Amount: Medium Exudate Type: Serous Exudate Color: amber Foul Odor After Cleansing: No Slough/Fibrino No Wound Bed Granulation Amount: Large (67-100%) Exposed Structure Granulation Quality: Red Fascia Exposed: No Necrotic Amount: None Present (0%) Fat Layer (Subcutaneous Tissue) Exposed: Yes Tendon Exposed: No Muscle Exposed: No Joint Exposed: No Bone Exposed: No Periwound Skin Texture Texture Color No Abnormalities Noted: Yes No Abnormalities Noted: No Hemosiderin Staining: Yes Moisture No Abnormalities Noted: Yes Temperature / Pain Temperature: No Abnormality Tenderness on Palpation:  Yes Treatment Notes Wound #6 (Lower Leg) Wound Laterality: Left, Medial Cleanser Soap and Water Discharge Instruction: May shower and wash wound with dial antibacterial soap and water prior to dressing change. Wound Cleanser Discharge Instruction: Cleanse the wound with wound cleanser prior to applying a clean dressing using gauze sponges, not tissue or cotton balls. Peri-Wound Care Sween Lotion (Moisturizing lotion) Discharge Instruction: Apply moisturizing lotion as directed Topical [name], [name] ([id number]) [id number] Page 16 of 21 Gentamicin Discharge Instruction: As directed by physician Mupirocin Ointment Discharge Instruction: Apply Mupirocin (Bactroban) as instructed Primary Dressing Maxorb Extra Ag+ Alginate Dressing, 4x4.75 (in/in) Discharge Instruction: Apply to wound bed as instructed Secondary Dressing ABD Pad, 8x10 Discharge Instruction: Apply over primary dressing as directed. Secured With Compression Wrap Urgo K2, (equivalent to a 4  layer) two layer compression system, regular Discharge Instruction: Apply Urgo K2 as directed (alternative to 4 layer compression). Compression Stockings Add-Ons Electronic Signature(s) Signed: 06/18/2023 4:14:29 PM By: Samuella Bruin Entered By: Samuella Bruin on 06/18/2023 07:27:05 -------------------------------------------------------------------------------- Wound Assessment Details Patient Name: Date of Service: [name]. 06/18/2023 10:00 A M Medical Record Number: [id number] Patient Account Number: [id number] Date of Birth/Sex: Treating RN: Oct 15, 1955 (67 y.o. [name] Primary Care Jacci Ruberg: Tresa Garter Other Clinician: Referring Jackquelyn Sundberg: Treating Alanmichael Barmore/Extender: Raiford Simmonds in Treatment: 1 Wound Status Wound Number: 7 Primary Lymphedema Etiology: Wound Location: Left, Anterior Ankle Wound Open Wounding Event:  Blister Status: Date Acquired: 06/15/2023 Comorbid Cataracts, Lymphedema, Chronic Obstructive Pulmonary Disease Weeks Of Treatment: 0 History: (COPD), Sleep Apnea, Angina, Arrhythmia, Congestive Heart Clustered Wound: No Failure, Coronary Artery Disease, Hypertension, Myocardial Infarction, Peripheral Venous Disease, Osteoarthritis, Neuropathy, Received Chemotherapy Photos Wound Measurements Length: (cm) 5.2 Width: (cm) 12.5 Depth: (cm) 0.1 Area: (cm) 51.051 Volume: (cm) 5.105 [name], [name] ([id number]) Wound Description Classification: Full Thickness Without Exposed Support Structures Wound Margin: Flat and Intact Exudate Amount: Medium Exudate Type: Serous Exudate Color: amber Foul Odor After Cleansing: No Slough/Fibrino No % Reduction in Area: % Reduction in Volume: Epithelialization: None Tunneling: No Undermining: No [id number] Page 17 of 21 Wound Bed Granulation Amount: Large (67-100%) Exposed Structure Granulation Quality: Red Fascia Exposed: No Necrotic Amount: None Present (0%) Fat Layer (Subcutaneous Tissue) Exposed: Yes Tendon Exposed: No Muscle Exposed: No Joint Exposed: No Bone Exposed: No Periwound Skin Texture Texture Color No Abnormalities Noted: Yes No Abnormalities Noted: No Hemosiderin Staining: Yes Moisture No Abnormalities Noted: Yes Temperature / Pain Temperature: No Abnormality Treatment Notes Wound #7 (Ankle) Wound Laterality: Left, Anterior Cleanser Soap and Water Discharge Instruction: May shower and wash wound with dial antibacterial soap and water prior to dressing change. Wound Cleanser Discharge Instruction: Cleanse the wound with wound cleanser prior to applying a clean dressing using gauze sponges, not tissue or cotton balls. Peri-Wound Care Sween Lotion (Moisturizing lotion) Discharge Instruction: Apply moisturizing lotion as directed Topical Gentamicin Discharge Instruction: As directed by  physician Mupirocin Ointment Discharge Instruction: Apply Mupirocin (Bactroban) as instructed Primary Dressing Maxorb Extra Ag+ Alginate Dressing, 4x4.75 (in/in) Discharge Instruction: Apply to wound bed as instructed Secondary Dressing ABD Pad, 8x10 Discharge Instruction: Apply over primary dressing as directed. Secured With Compression Wrap Urgo K2, (equivalent to a 4 layer) two layer compression system, regular Discharge Instruction: Apply Urgo K2 as directed (alternative to 4 layer compression). Compression Stockings Add-Ons Electronic Signature(s) Signed: 06/18/2023 4:14:29 PM By: Samuella Bruin Entered By: Samuella Bruin on 06/18/2023 07:28:30 Delphina Cahill ([id number]) [id number] Page 18 of 21 -------------------------------------------------------------------------------- Wound Assessment Details Patient Name: Date of Service: [name], BOMBERGER. 06/18/2023 10:00 A M Medical Record Number: [id number] Patient Account Number: [id number] Date of Birth/Sex: Treating RN: 06/28/1956 (67 y.o. [name] Primary Care Tram Wrenn: Tresa Garter Other Clinician: Referring Melaina Howerton: Treating Sadiq Mccauley/Extender: Raiford Simmonds in Treatment: 1 Wound Status Wound Number: 8 Primary Lymphedema Etiology: Wound Location: Left, Distal, Anterior Lower Leg Wound Open Wounding Event: Blister Status: Date Acquired: 06/15/2023 Comorbid Cataracts, Lymphedema, Chronic Obstructive Pulmonary Disease Weeks Of Treatment: 0 History: (COPD), Sleep Apnea, Angina, Arrhythmia, Congestive Heart Clustered Wound: No Failure, Coronary Artery Disease, Hypertension, Myocardial Infarction, Peripheral Venous Disease, Osteoarthritis, Neuropathy, Received Chemotherapy Photos Wound Measurements Length: (cm) 6.5 Width: (cm) 9 Depth: (cm) 0.1 Area: (cm) 45.946 Volume: (cm) 4.595 % Reduction in Area: %  Reduction in  Volume: Epithelialization: None Tunneling: No Undermining: No Wound Description Classification: Full Thickness Without Exposed Support Structures Wound Margin: Flat and Intact Exudate Amount: Medium Exudate Type: Serous Exudate Color: amber Foul Odor After Cleansing: No Slough/Fibrino No Wound Bed Granulation Amount: Large (67-100%) Exposed Structure Granulation Quality: Red Fascia Exposed: No Necrotic Amount: None Present (0%) Fat Layer (Subcutaneous Tissue) Exposed: Yes Tendon Exposed: No Muscle Exposed: No Joint Exposed: No Bone Exposed: No Periwound Skin Texture Texture Color No Abnormalities Noted: Yes No Abnormalities Noted: No Hemosiderin Staining: Yes Moisture No Abnormalities Noted: Yes Temperature / Pain Temperature: No Abnormality Treatment Notes Wound #8 (Lower Leg) Wound Laterality: Left, Anterior, Distal Cleanser Soap and Water Discharge Instruction: May shower and wash wound with dial antibacterial soap and water prior to dressing change. Wound Cleanser Discharge Instruction: Cleanse the wound with wound cleanser prior to applying a clean dressing using gauze sponges, not tissue or cotton balls. AYUUB, PENLEY ([id number]) [id number] Page 19 of 21 Peri-Wound Care Sween Lotion (Moisturizing lotion) Discharge Instruction: Apply moisturizing lotion as directed Topical Gentamicin Discharge Instruction: As directed by physician Mupirocin Ointment Discharge Instruction: Apply Mupirocin (Bactroban) as instructed Primary Dressing Maxorb Extra Ag+ Alginate Dressing, 4x4.75 (in/in) Discharge Instruction: Apply to wound bed as instructed Secondary Dressing ABD Pad, 8x10 Discharge Instruction: Apply over primary dressing as directed. Secured With Compression Wrap Urgo K2, (equivalent to a 4 layer) two layer compression system, regular Discharge Instruction: Apply Urgo K2 as directed (alternative to 4 layer  compression). Compression Stockings Add-Ons Electronic Signature(s) Signed: 06/18/2023 4:14:29 PM By: Samuella Bruin Entered By: Samuella Bruin on 06/18/2023 07:27:57 -------------------------------------------------------------------------------- Wound Assessment Details Patient Name: Date of Service: [name]. 06/18/2023 10:00 A M Medical Record Number: [id number] Patient Account Number: [id number] Date of Birth/Sex: Treating RN: November 21, 1955 (67 y.o. [name] Primary Care Kerianna Rawlinson: Tresa Garter Other Clinician: Referring Doretta Remmert: Treating Mario Voong/Extender: Raiford Simmonds in Treatment: 1 Wound Status Wound Number: 9 Primary Lymphedema Etiology: Wound Location: Left, Lateral Lower Leg Wound Open Wounding Event: Blister Status: Date Acquired: 06/15/2023 Comorbid Cataracts, Lymphedema, Chronic Obstructive Pulmonary Disease Weeks Of Treatment: 0 History: (COPD), Sleep Apnea, Angina, Arrhythmia, Congestive Heart Clustered Wound: No Failure, Coronary Artery Disease, Hypertension, Myocardial Infarction, Peripheral Venous Disease, Osteoarthritis, Neuropathy, Received Chemotherapy Photos Wound Measurements LEV, CERVONE ([id number]) Length: (cm) 1.2 Width: (cm) 0.8 Depth: (cm) 0.1 Area: (cm) 0.754 Volume: (cm) 0.075 [id number] Page 20 of 21 % Reduction in Area: % Reduction in Volume: Epithelialization: None Tunneling: No Undermining: No Wound Description Classification: Full Thickness Without Exposed Support Structures Wound Margin: Distinct, outline attached Exudate Amount: Medium Exudate Type: Serous Exudate Color: amber Foul Odor After Cleansing: No Slough/Fibrino No Wound Bed Granulation Amount: Large (67-100%) Exposed Structure Granulation Quality: Red Fascia Exposed: No Necrotic Amount: None Present (0%) Fat Layer (Subcutaneous Tissue) Exposed: Yes Tendon Exposed:  No Muscle Exposed: No Joint Exposed: No Bone Exposed: No Periwound Skin Texture Texture Color No Abnormalities Noted: Yes No Abnormalities Noted: No Hemosiderin Staining: Yes Moisture No Abnormalities Noted: Yes Temperature / Pain Temperature: No Abnormality Treatment Notes Wound #9 (Lower Leg) Wound Laterality: Left, Lateral Cleanser Soap and Water Discharge Instruction: May shower and wash wound with dial antibacterial soap and water prior to dressing change. Wound Cleanser Discharge Instruction: Cleanse the wound with wound cleanser prior to applying a clean dressing using gauze sponges, not tissue or cotton balls. Peri-Wound Care Sween Lotion (Moisturizing lotion) Discharge Instruction: Apply moisturizing lotion as directed Topical Gentamicin  Discharge Instruction: As directed by physician Mupirocin Ointment Discharge Instruction: Apply Mupirocin (Bactroban) as instructed Primary Dressing Maxorb Extra Ag+ Alginate Dressing, 4x4.75 (in/in) Discharge Instruction: Apply to wound bed as instructed Secondary Dressing ABD Pad, 8x10 Discharge Instruction: Apply over primary dressing as directed. Secured With Compression Wrap Urgo K2, (equivalent to a 4 layer) two layer compression system, regular Discharge Instruction: Apply Urgo K2 as directed (alternative to 4 layer compression). Compression Stockings Add-Ons Electronic Signature(s) Signed: 06/18/2023 4:14:29 PM By: Samuella Bruin Entered By: Samuella Bruin on 06/18/2023 07:30:10 Delphina Cahill ([id number]) [id number] Page 21 of 21 -------------------------------------------------------------------------------- Vitals Details Patient Name: Date of Service: AHMARION, SARACENO. 06/18/2023 10:00 A M Medical Record Number: [id number] Patient Account Number: [id number] Date of Birth/Sex: Treating RN: [birth year] (67 y.o. [name] Primary Care Atleigh Gruen: Tresa Garter Other  Clinician: Referring Akon Reinoso: Treating Nocole Zammit/Extender: Raiford Simmonds in Treatment: 1 Vital Signs Time Taken: 10:05 Temperature (F): 97.7 Height (in): 72 Pulse (bpm): 72 Weight (lbs): 252 Respiratory Rate (breaths/min): 22 Body Mass Index (BMI): 34.2 Blood Pressure (mmHg): 82/48 Reference Range: 80 - 120 mg / dl Electronic Signature(s) Signed: 06/18/2023 4:14:29 PM By: Samuella Bruin Entered By: Samuella Bruin on 06/18/2023 07:06:40

## 2023-06-18 NOTE — ED Provider Notes (Signed)
EMERGENCY DEPARTMENT AT [facility] Provider Note   CSN: [id number] Arrival date & time: 06/18/23  1139     History  Chief Complaint  Patient presents with   Leg Swelling   Groin Swelling    [name] is a 67 y.o. male.  67 year old male presents today for concern of worsening peripheral edema as well as distention to the abdomen.  Does have history of heart failure but is noncompliant with his diuretics.  He states he takes them about 3 times a week due to the concern that if he takes it daily he will constantly need to go to the bathroom.  Denies any chest pain.  Reportedly he had O2 sats of 80% at the clinic prior to being sent into the emergency room but he states that his nasal cannula was kinked and he was not getting any oxygen.  The history is provided by the patient.       Home Medications Prior to Admission medications   Medication Sig Start Date End Date Taking? Authorizing Provider  albuterol (PROVENTIL) (2.5 MG/3ML) 0.083% nebulizer solution INHALE 1 VIAL VIA NEBULIZER EVERY DAY AS NEEDED FOR WHEEZING/SHORTNESS OF BREATH 12/28/22  Yes Plotnikov, Georgina Quint, MD  aspirin EC 81 MG tablet Take 81 mg by mouth every evening.    Yes [provider]  aspirin-sod bicarb-citric acid (ALKA-SELTZER) 325 MG TBEF tablet Take 650 mg by mouth every 6 (six) hours as needed (indigestion).   Yes [provider]  clonazePAM (KLONOPIN) 1 MG tablet Take 1 tablet (1 mg total) by mouth at bedtime. 02/21/23  Yes Plotnikov, Georgina Quint, MD  clopidogrel (PLAVIX) 75 MG tablet TAKE 1 TABLET BY MOUTH EVERY DAY WITH BREAKFAST 05/22/23  Yes Plotnikov, Georgina Quint, MD  metolazone (ZAROXOLYN) 5 MG tablet Take 1 tablet (5 mg total) by mouth daily as needed (swelling). 02/07/23  Yes Plotnikov, Georgina Quint, MD  metoprolol succinate (TOPROL-XL) 25 MG 24 hr tablet Take 0.5 tablets (12.5 mg total) by mouth daily. Take with or immediately following a meal. 05/24/23  Yes  Plotnikov, Georgina Quint, MD  nitroGLYCERIN (NITROSTAT) 0.4 MG SL tablet Place 1 tablet (0.4 mg total) under the tongue every 5 (five) minutes as needed for chest pain (Up to 3 doses). 01/21/16 06/18/23 Yes Wendall Stade, MD  oxyCODONE-acetaminophen (PERCOCET) 7.5-325 MG tablet Take 1 tablet by mouth every 6 (six) hours as needed for severe pain. 02/21/23  Yes Plotnikov, Georgina Quint, MD  oxymetazoline (AFRIN) 0.05 % nasal spray Place 1 spray into both nostrils 2 (two) times daily as needed for congestion.   Yes [provider]  simvastatin (ZOCOR) 20 MG tablet Take 1 tablet (20 mg total) by mouth at bedtime. 02/12/23  Yes Plotnikov, Georgina Quint, MD  torsemide (DEMADEX) 20 MG tablet Take 20 mg by mouth daily.   Yes [provider]  senna-docusate (SENOKOT-S) 8.6-50 MG tablet Take 1-2 tablets by mouth at bedtime. Patient not taking: Reported on 06/18/2023 11/22/22 11/22/23  Plotnikov, Georgina Quint, MD      Allergies    Bendamustine hcl, Heparin, Bendamustine hcl, and Tape    Review of Systems   Review of Systems  Constitutional:  Negative for chills and fever.  Respiratory:  Positive for shortness of breath.   Cardiovascular:  Positive for leg swelling. Negative for chest pain.  Gastrointestinal:  Negative for abdominal pain and nausea.  Neurological:  Negative for light-headedness.  All other systems reviewed and are negative.   Physical  Exam Updated Vital Signs BP (!) 149/125   Pulse 72   Temp 97.9 F (36.6 C) (Oral)   Resp (!) 25   Ht 6' (1.829 m)   Wt 118.4 kg   SpO2 100%   BMI 35.40 kg/m  Physical Exam Vitals and nursing note reviewed.  Constitutional:      General: He is not in acute distress.    Appearance: Normal appearance. He is not ill-appearing.  HENT:     Head: Normocephalic and atraumatic.     Nose: Nose normal.  Eyes:     Conjunctiva/sclera: Conjunctivae normal.  Cardiovascular:     Rate and Rhythm: Normal rate and regular rhythm.     Heart sounds: Normal  heart sounds.  Pulmonary:     Effort: Pulmonary effort is normal. No respiratory distress.  Abdominal:     General: There is no distension.     Tenderness: There is no abdominal tenderness.     Comments: Pitting edema to the abdomen as well as bilateral lower extremities up to the groin  Musculoskeletal:        General: No deformity.     Right lower leg: Edema present.     Left lower leg: Edema present.  Skin:    Findings: No rash.  Neurological:     Mental Status: He is alert.     ED Results / Procedures / Treatments   Labs (all labs ordered are listed, but only abnormal results are displayed) Labs Reviewed  BASIC METABOLIC PANEL - Abnormal; Notable for the following components:      Result Value   Chloride 84 (*)    CO2 40 (*)    BUN 42 (*)    Creatinine, Ser 1.96 (*)    Calcium 8.8 (*)    GFR, Estimated 36 (*)    All other components within normal limits  CBC - Abnormal; Notable for the following components:   RBC 3.23 (*)    Hemoglobin 9.2 (*)    HCT 31.3 (*)    MCHC 29.4 (*)    RDW 18.1 (*)    Platelets 136 (*)    All other components within normal limits  BRAIN NATRIURETIC PEPTIDE - Abnormal; Notable for the following components:   B Natriuretic Peptide 2,104.7 (*)    All other components within normal limits  TROPONIN I (HIGH SENSITIVITY) - Abnormal; Notable for the following components:   Troponin I (High Sensitivity) 97 (*)    All other components within normal limits  HEPATIC FUNCTION PANEL  TROPONIN I (HIGH SENSITIVITY)    EKG None  Radiology No results found.  Procedures Procedures    Medications Ordered in ED Medications  furosemide (LASIX) injection 80 mg (80 mg Intravenous Given 06/18/23 1354)    ED Course/ Medical Decision Making/ A&P Clinical Course as of 06/18/23 1524  Mon Jun 18, 2023  1500 BLE edema. H/o chf. Poor compliance with diuretics. Grossly colume overloaded. Coming in for IV diuretics and CHF ex. Admit. [JR]    Clinical  Course User Index [JR] Gareth Eagle, PA-C                                 Medical Decision Making Amount and/or Complexity of Data Reviewed Labs: ordered. Radiology: ordered.  Risk Prescription drug management.   Medical Decision Making / ED Course   This patient presents to the ED for concern of peripheral edema, this involves  an extensive number of treatment options, and is a complaint that carries with it a high risk of complications and morbidity.  The differential diagnosis includes CHF exacerbation, ACS, DVT  MDM: 67 year old male with past medical history of CHF who is noncompliant with his diuretic regimen presents today for worsening edema.  Less likely DVT given that it is bilateral.  Low suspicion for ACS given he does not have any chest pain.  CBC without leukocytosis.  Hemoglobin around his baseline.  BMP with creatinine 1.96 which is above his baseline consistent with AKI.  BNP of 2100 which is significantly elevated he on his baseline.  Hepatic function panel without acute concerns.  Chest x-ray is still pending.  EKG without acute ischemic changes.  Discussed with hospitalist will evaluate patient for admission.  Lasix 80 mg IVP given.   Lab Tests: -I ordered, reviewed, and interpreted labs.   The pertinent results include:   Labs Reviewed  BASIC METABOLIC PANEL - Abnormal; Notable for the following components:      Result Value   Chloride 84 (*)    CO2 40 (*)    BUN 42 (*)    Creatinine, Ser 1.96 (*)    Calcium 8.8 (*)    GFR, Estimated 36 (*)    All other components within normal limits  CBC - Abnormal; Notable for the following components:   RBC 3.23 (*)    Hemoglobin 9.2 (*)    HCT 31.3 (*)    MCHC 29.4 (*)    RDW 18.1 (*)    Platelets 136 (*)    All other components within normal limits  BRAIN NATRIURETIC PEPTIDE - Abnormal; Notable for the following components:   B Natriuretic Peptide 2,104.7 (*)    All other components within normal limits   HEPATIC FUNCTION PANEL - Abnormal; Notable for the following components:   Bilirubin, Direct 0.4 (*)    All other components within normal limits  TROPONIN I (HIGH SENSITIVITY) - Abnormal; Notable for the following components:   Troponin I (High Sensitivity) 97 (*)    All other components within normal limits  TROPONIN I (HIGH SENSITIVITY) - Abnormal; Notable for the following components:   Troponin I (High Sensitivity) 88 (*)    All other components within normal limits      EKG  EKG Interpretation Date/Time:    Ventricular Rate:    PR Interval:    QRS Duration:    QT Interval:    QTC Calculation:   R Axis:      Text Interpretation:           Imaging Studies ordered: I ordered imaging studies including cxr I independently visualized and interpreted imaging. I agree with the radiologist interpretation   Medicines ordered and prescription drug management: Meds ordered this encounter  Medications   furosemide (LASIX) injection 80 mg    -I have reviewed the patients home medicines and have made adjustments as needed   Cardiac Monitoring: The patient was maintained on a cardiac monitor.  I personally viewed and interpreted the cardiac monitored which showed an underlying rhythm of: NSR   Reevaluation: After the interventions noted above, I reevaluated the patient and found that they have :stayed the same  Co morbidities that complicate the patient evaluation  Past Medical History:  Diagnosis Date   Anginal pain (HCC)    not had to use in awhile  none in 10 years   Anxiety    Basal cell carcinoma of nose  Blood dyscrasia    trouble clotting    Carotid artery disease (HCC)    s/p L CEA 2009 (hx of evacuation of hematoma due to heparin)   CHF (congestive heart failure) (HCC)    Chronic lower back pain    COPD (chronic obstructive pulmonary disease) (HCC)    Coronary artery disease    CABG 2005. s/p PTCA and stenting of the saphenous vein graft to PDA and  saphenous vein graft to obtuse marginal by Dr. Excell Seltzer 09/29/11. 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   Myocardial infarction (HCC)    Obesity    Shortness of breath dyspnea    with exertion    Sleep apnea    "used to"      could not use 2006      Dispostion: Discussed with Dr. Katrinka Blazing of [facility] hospitalist will evaluate patient for admission.  Final Clinical Impression(s) / ED Diagnoses Final diagnoses:  Anasarca  Acute on chronic congestive heart failure, unspecified heart failure type [facility])    Rx / DC Orders ED Discharge Orders     None         Marita Kansas, PA-C 06/18/23 1524    Ernie Avena, MD 06/21/23 1147

## 2023-06-19 ENCOUNTER — Other Ambulatory Visit (HOSPITAL_COMMUNITY): Payer: Self-pay

## 2023-06-19 ENCOUNTER — Inpatient Hospital Stay (HOSPITAL_COMMUNITY): Payer: Medicare Other

## 2023-06-19 DIAGNOSIS — I4892 Unspecified atrial flutter: Secondary | ICD-10-CM

## 2023-06-19 DIAGNOSIS — I5033 Acute on chronic diastolic (congestive) heart failure: Secondary | ICD-10-CM | POA: Diagnosis not present

## 2023-06-19 DIAGNOSIS — N189 Chronic kidney disease, unspecified: Secondary | ICD-10-CM

## 2023-06-19 DIAGNOSIS — N179 Acute kidney failure, unspecified: Secondary | ICD-10-CM

## 2023-06-19 DIAGNOSIS — I10 Essential (primary) hypertension: Secondary | ICD-10-CM | POA: Diagnosis not present

## 2023-06-19 LAB — ECHOCARDIOGRAM COMPLETE
Area-P 1/2: 3.81 cm2
Calc EF: 58.2 %
Height: 72 in
MV M vel: 4.02 m/s
MV Peak grad: 64.6 mm[Hg]
MV VTI: 2.63 cm2
S' Lateral: 4.2 cm
Single Plane A2C EF: 59.1 %
Single Plane A4C EF: 55.3 %
Weight: 4197.56 [oz_av]

## 2023-06-19 LAB — BASIC METABOLIC PANEL
Anion gap: 11 (ref 5–15)
BUN: 43 mg/dL — ABNORMAL HIGH (ref 8–23)
CO2: 43 mmol/L — ABNORMAL HIGH (ref 22–32)
Calcium: 8.5 mg/dL — ABNORMAL LOW (ref 8.9–10.3)
Chloride: 85 mmol/L — ABNORMAL LOW (ref 98–111)
Creatinine, Ser: 1.76 mg/dL — ABNORMAL HIGH (ref 0.61–1.24)
GFR, Estimated: 41 mL/min — ABNORMAL LOW (ref >60)
Glucose, Bld: 111 mg/dL — ABNORMAL HIGH (ref 70–99)
Potassium: 3.8 mmol/L (ref 3.5–5.1)
Sodium: 139 mmol/L (ref 135–145)

## 2023-06-19 LAB — TROPONIN I (HIGH SENSITIVITY): Troponin I (High Sensitivity): 66 ng/L — ABNORMAL HIGH (ref <18)

## 2023-06-19 MED ORDER — DAPAGLIFLOZIN PROPANEDIOL 10 MG PO TABS
10.0000 mg | ORAL_TABLET | Freq: Every day | ORAL | Status: DC
  Administered 2023-06-19 – 2023-06-23 (×5): 10 mg via ORAL
  Filled 2023-06-19 (×7): qty 1

## 2023-06-19 MED ORDER — APIXABAN 5 MG PO TABS
5.0000 mg | ORAL_TABLET | Freq: Two times a day (BID) | ORAL | Status: DC
  Administered 2023-06-19 – 2023-06-23 (×10): 5 mg via ORAL
  Filled 2023-06-19 (×10): qty 1

## 2023-06-19 NOTE — Assessment & Plan Note (Addendum)
CKD stage 3a  Renal function with serum cr at 1,76 with K at 3,8 and serum bicarbonate at 43. Na 139  Plan to continue diuresis and follow up renal function in am.

## 2023-06-19 NOTE — Progress Notes (Signed)
Rounding Note    Patient Name: [name] Date of Encounter: 06/19/2023  [facility] Cardiologist: Charlton Haws, MD   Subjective   No CP or dyspnea  Inpatient Medications    Scheduled Meds:  aspirin EC  81 mg Oral QPM   clonazePAM  1 mg Oral QHS   clopidogrel  75 mg Oral Daily   furosemide  80 mg Intravenous BID   metoprolol succinate  12.5 mg Oral Daily   simvastatin  20 mg Oral QHS   sodium chloride flush  3 mL Intravenous Q12H   Continuous Infusions:  sodium chloride     PRN Meds: sodium chloride, acetaminophen, ondansetron (ZOFRAN) IV, oxyCODONE-acetaminophen, senna-docusate, sodium chloride flush   Vital Signs    Vitals:   06/18/23 2300 06/19/23 0327 06/19/23 0511 06/19/23 0710  BP:  (!) 124/48  124/60  Pulse:  73  74  Resp: 18 20  18   Temp: 98.4 F (36.9 C) 98.1 F (36.7 C)  98.5 F (36.9 C)  TempSrc: Axillary Oral  Oral  SpO2:  99%  100%  Weight:   119 kg   Height:        Intake/Output Summary (Last 24 hours) at 06/19/2023 0915 Last data filed at 06/19/2023 0329 Gross per 24 hour  Intake 120 ml  Output 1150 ml  Net -1030 ml      06/19/2023    5:11 AM 06/18/2023   11:56 AM 05/24/2023   10:36 AM  Last 3 Weights  Weight (lbs) 262 lb 5.6 oz 261 lb 252 lb  Weight (kg) 119 kg 118.389 kg 114.306 kg      Telemetry    Atrial flutter - Personally Reviewed   Physical Exam   GEN: No acute distress.   Neck: positive JVD Cardiac: RRR, 2/6 systolic murmur Respiratory: Clear to auscultation bilaterally. GI: Soft, abdominal wall edema noted MS: 4+ edema; legs are wrapped. Neuro:  Nonfocal  Psych: Normal affect   Labs    High Sensitivity Troponin:   Recent Labs  Lab 06/18/23 1213 06/18/23 1351  TROPONINIHS 97* 88*     Chemistry Recent Labs  Lab 06/18/23 1213 06/18/23 1351 06/19/23 0517  NA 137  --  139  K 4.2  --  3.8  CL 84*  --  85*  CO2 40*  --  43*  GLUCOSE 91  --  111*  BUN 42*  --  43*  CREATININE 1.96*   --  1.76*  CALCIUM 8.8*  --  8.5*  PROT  --  6.8  --   ALBUMIN  --  3.5  --   AST  --  26  --   ALT  --  15  --   ALKPHOS  --  81  --   BILITOT  --  1.1  --   GFRNONAA 36*  --  41*  ANIONGAP 13  --  11    Hematology Recent Labs  Lab 06/18/23 1213  WBC 7.6  RBC 3.23*  HGB 9.2*  HCT 31.3*  MCV 96.9  MCH 28.5  MCHC 29.4*  RDW 18.1*  PLT 136*    BNP Recent Labs  Lab 06/18/23 1213  BNP 2,104.7*      Radiology    DG Chest Port 1 View  Result Date: 06/18/2023 CLINICAL DATA:  Cough. EXAM: PORTABLE CHEST 1 VIEW COMPARISON:  Chest radiograph dated 02/17/2020 and CT dated 05/15/2022. FINDINGS: There is cardiomegaly with vascular congestion and edema. Pneumonia is not excluded. There  is bilateral calcified pleural plaques. Trace bilateral pleural effusions versus pleural thickening. There is mild cardiomegaly. Atherosclerotic calcification of the aortic arch. Median sternotomy wires and CABG vascular clips. No acute osseous pathology. IMPRESSION: 1. Cardiomegaly with vascular congestion and edema. Pneumonia is not excluded. 2. Bilateral calcified pleural plaques. Electronically Signed   By: Elgie Collard M.D.   On: 06/18/2023 16:10     Patient Profile     67 y.o. male with past medical history of coronary artery disease status post coronary artery bypass and graft, chronic kidney disease, peripheral vascular disease, COPD, obstructive sleep apnea, diabetes mellitus being evaluated for acute on chronic diastolic congestive heart failure.  Last echocardiogram September 2023 showed normal LV function, mild left ventricular hypertrophy, grade 2 diastolic dysfunction, moderate pulmonary hypertension, mild biatrial enlargement, mild mitral regurgitation, mild to moderate tricuspid regurgitation.  Patient admitted with marked volume excess/acute on chronic diastolic congestive heart failure and cardiology asked to evaluate.  Assessment & Plan    1 acute on chronic diastolic congestive  heart failure-patient is markedly volume overloaded.  Will continue Lasix at present dose.  Add Farxiga 10 mg daily.  Follow renal function closely.  Await follow-up echocardiogram.  2 atrial flutter-I have reviewed previous electrocardiograms in atrial flutter appears to be at least 54-year-old.  Will continue Toprol.  CHA2DS2-VASc is 5.  Add apixaban 5 mg twice daily.  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  Would ultimately attempt TEE guided cardioversion to see if this would also help improve his CHF.  3 coronary artery disease-given addition of apixaban will discontinue aspirin and Plavix.  Continue statin.  4 chronic stage III kidney disease-we will continue to follow renal function closely with diuresis.  5 hyperlipidemia-continue statin.  For questions or updates, please contact [facility] Please consult [email or web address] for contact info under        Signed, Olga Millers, MD  06/19/2023, 9:15 AM

## 2023-06-19 NOTE — Assessment & Plan Note (Signed)
No clinical signs of exacerbation.  ?Continue bronchodilator therapy.  ?

## 2023-06-19 NOTE — Consult Note (Signed)
[facility]  Consult   06/19/2023  [name] 1955-09-12 [id number]  Discussion in progression meeting [facility] Patient: Medicare ACO REACH  Primary Care Provider:  Tresa Garter, MD  Met with patient at the bedside up in the recliner. Explained reason for visit and to check for any additional post hospital needs.  Patient is currently active with [facility] Customer service manager [[facility]] Care Management for care coordination services.  Patient has been engaged by a Tourist information centre manager.  The community based plan of care has focused on disease management and community resource support.    Plan: Will continue to follow for post hospital needs.  Anticipate patient to be followed by the community [facility] team for post hospital follow up. Patient was accepting and expressed no additional needs at this time.  Of [facility] services does not replace or interfere with any services that are needed or arranged by inpatient [facility] care management team.   Charlesetta Shanks, RN, BSN, CCM [place]  [facility], [facility] Liaison Direct Dial: [phone] or secure chat Email: [email or web address]

## 2023-06-19 NOTE — Progress Notes (Signed)
  Echocardiogram 2D Echocardiogram has been performed.  [name] 06/19/2023, 3:55 PM

## 2023-06-19 NOTE — Assessment & Plan Note (Signed)
Patient has been placed on metoprolol for rate control and anticoagulation with apixaban. CHA2DS2 vasc score is elevated at 5.

## 2023-06-19 NOTE — Progress Notes (Signed)
Progress Note   Patient: [name] ZOX:[id number] DOB: [birth year] DOA: 06/18/2023     1 DOS: the patient was seen and examined on 06/19/2023   Brief hospital course: [name] was admitted to the hospital with the working diagnosis of decompensated heart failure.   67 yo male with the past medical history of hypertension, heart failure, coronary artery disease, COPD, and follicular lymphoma who presented with edema. Reported worsening lower extremity edema over 2 weeks, to the point where he noticed edema in his abdomen and his legs developed blisters. Very weak and deconditioned at home with multiple falls. On his initial physical examination his blood pressure was 143/66, HR 72, RR 26 and 02 saturation 98%, lungs with decreased breath sounds and bilateral rales, no wheezing, heart with S1 and S2 present and regular with no gallops, rubs or murmurs, abdomen with no distention and positive lower extremity edema.   Na 137, K 4,2 Cl 84, bicarbonate 40, glucose 91, bun 42 cr 1,96  BNP 2,104 High sensitive troponin 97 and 88  Wbc 7,6 hgb 9,2 plt 136   Chest radiograph with cardiomegaly, bilateral hilar vascular congestion and cephalization of the vasculature, bilateral small pleural effusions. Sternotomy wires in place.  EKG with 73 bpm, left axis deviation, right bundle branch block, atrial tachycardia with no significant ST segment or T wave changes, positive PVCs, positive significant artifact.   Assessment and Plan: * Acute on chronic diastolic CHF (congestive heart failure) (HCC) 04/2022 echocardiogram with preserved LV systolic function EF 60 to 65%, mild LVH, RV systolic function preserved, RV with mild enlargement, RVSP 47,4 mmHg, LA and RA with mild dilatation, mild to moderate TR,   Urine output 1,150 ml Systolic blood pressure 124 to 109 mmHg.   Plan to continue diuresis with furosemide 80 mg bid and SGLT 2 inh to augment diuresis.   Acute on chronic hypoxemic respiratory  failure due to acute cardiogenic pulmonary edema.  Continue diuresis and supplemental 02 per [place].  02 saturation today is 90% on 2 L/min per [place].   Atrial flutter (HCC) Patient has been placed on metoprolol for rate control and anticoagulation with apixaban. CHA2DS2 vasc score is elevated at 5.   Acute kidney injury superimposed on chronic kidney disease (HCC) CKD stage 3a  Renal function with serum cr at 1,76 with K at 3,8 and serum bicarbonate at 43. Na 139  Plan to continue diuresis and follow up renal function in am.   Essential hypertension Continue blood pressure monitoring Aggressive diuresis with furosemide IV.   CAD S/P percutaneous coronary angioplasty No chest pain. Elevation in high sensitive troponin due to heart failure. Ruled out acute coronary syndrome.  Continue statin therapy Now on apixaban, discontinue aspirin and clopidogrel.   COPD mixed type (HCC) No clinical signs of exacerbation Continue bronchodilator therapy.   History of follicular lymphoma Follow up as outpatient.   Open wound of lower extremity without complication, subsequent encounter Continue wound care.   Thrombocytopenia (HCC) Stable follow up cell count as outpatient.   Obesity (BMI 30-39.9) Calculated BMI is 35.5 consistent with obesity class 2.   Falls PT and OT        Subjective: Patient with no chest pain, continue to have dyspnea and edema, improved but not back to baseline.   Physical Exam: Vitals:   06/19/23 0511 06/19/23 0710 06/19/23 0944 06/19/23 1048  BP:  124/60 (!) 109/51 (!) 118/54  Pulse:  74 75 75  Resp:  18  18  Temp:  98.5 F (36.9 C)  98.2 F (36.8 C)  TempSrc:  Oral  Oral  SpO2:  100%  90%  Weight: 119 kg     Height:       Neurology awake and alert, deconditioned  ENT with mild pallor Cardiovascular with S1 and S2 present and regular with no gallops, or rubs, positive systolic murmur at the right lower sternal border No mild JVD Positive lower  extremity edema ++ wraps in place Respiratory with rales and rhonchi bilaterally with no wheezing Abdomen with no distention  Data Reviewed:    Family Communication: I spoke with patient's wife at the bedside, we talked in detail about patient's condition, plan of care and prognosis and all questions were addressed.   Disposition: Status is: Inpatient Remains inpatient appropriate because: heart failure decompensation   Planned Discharge Destination: Home      Author: Coralie Keens, MD 06/19/2023 2:15 PM  For on call review [email or web address].

## 2023-06-19 NOTE — Assessment & Plan Note (Signed)
Stable follow up cell count as outpatient.

## 2023-06-19 NOTE — Assessment & Plan Note (Signed)
Continue wound care. ?

## 2023-06-19 NOTE — Assessment & Plan Note (Signed)
Continue blood pressure monitoring Aggressive diuresis with furosemide IV.

## 2023-06-19 NOTE — TOC Benefit Eligibility Note (Signed)
Patient Product/process development scientist completed.    The patient is insured through [employer]. Patient has Medicare and is not eligible for a copay card, but may be able to apply for patient assistance, if available.    Ran test claim for Eliquis 5 mg and the current 30 day co-pay is $128.96 due to a deductible.  Ran test claim for Xarelto 20 mg and the current 30 day co-pay is $123.90 due to a deductible.   This test claim was processed through [facility]- copay amounts may vary at other pharmacies due to pharmacy/plan contracts, or as the patient moves through the different stages of their insurance plan.     Roland Earl, CPHT Pharmacy Technician III Certified Patient Advocate [facility] Patient Advocate Team Direct Number: [phone]  Fax: [phone]

## 2023-06-19 NOTE — Evaluation (Signed)
Physical Therapy Evaluation Patient Details Name: [name] MRN: [id number] DOB: July 26, 1956 Today's Date: 06/19/2023  History of Present Illness  Pt is 67 yo male who presents on 06/18/23 with acute on chronic congestive heart failure with progressively worsening swelling over several weeks. PMH: CAD, CABG and PCI, COPD, OSA, CHF, HTN, GERD, CKD3, follicular lymphoma  Clinical Impression  Pt admitted with above diagnosis. Pt received in bed, wife present. Pt has been ambulating independently at home but wife has been helping with LE wound care and LB washing and dressing. Pt reports increased pain and swelling in B feet over past several weeks. Pt mobilizing at supervision level. Remained on 2L O2 with ambulation and SPO2 did not drop below 90%. HR 75 bpm. Will follow acutely but do not anticipate pt needing PT at d/c.  Pt currently with functional limitations due to the deficits listed below (see PT Problem List). Pt will benefit from acute skilled PT to increase their independence and safety with mobility to allow discharge.           If plan is discharge home, recommend the following: A little help with walking and/or transfers;A little help with bathing/dressing/bathroom;Assist for transportation;Assistance with cooking/housework;Help with stairs or ramp for entrance   Can travel by private vehicle        Equipment Recommendations None recommended by PT  Recommendations for Other Services       Functional Status Assessment Patient has had a recent decline in their functional status and demonstrates the ability to make significant improvements in function in a reasonable and predictable amount of time.     Precautions / Restrictions Precautions Precautions: Fall Restrictions Weight Bearing Restrictions: No Other Position/Activity Restrictions: B LE wounds, has unna boots      Mobility  Bed Mobility Overal bed mobility: Needs Assistance Bed Mobility: Supine to Sit      Supine to sit: Supervision     General bed mobility comments: increased time needed but no physical assist    Transfers Overall transfer level: Needs assistance Equipment used: Rolling walker (2 wheels) Transfers: Sit to/from Stand Sit to Stand: Contact guard assist           General transfer comment: CGA for safety    Ambulation/Gait Ambulation/Gait assistance: Contact guard assist Gait Distance (Feet): 15 Feet Assistive device: Rolling walker (2 wheels) Gait Pattern/deviations: Step-through pattern, Wide base of support Gait velocity: decreased Gait velocity interpretation: <1.31 ft/sec, indicative of household ambulator   General Gait Details: pt steady with use of RW but fatigues quickly with increased SOB. SPO2 remained in 90% on 2L. HR 75 bpm  Stairs            Wheelchair Mobility     Tilt Bed    Modified Rankin (Stroke Patients Only)       Balance Overall balance assessment: Mild deficits observed, not formally tested                                           Pertinent Vitals/Pain Pain Assessment Pain Assessment: 0-10 Pain Score: 6  Pain Location: B feet Pain Descriptors / Indicators: Burning Pain Intervention(s): Limited activity within patient's tolerance, Monitored during session, Premedicated before session    Home Living Family/patient expects to be discharged to:: Private residence Living Arrangements: Spouse/significant other Available Help at Discharge: Family;Available 24 hours/day Type of Home: House Home Access: Stairs  to enter Entrance Stairs-Rails: Right;Left;Can reach both Entrance Stairs-Number of Steps: 6   Home Layout: One level Home Equipment: Agricultural consultant (2 wheels);Rollator (4 wheels);Hand held shower head      Prior Function Prior Level of Function : Needs assist;Driving             Mobility Comments: independent short distances on 2L O2, sometimes uses RW. Sleeps in recliner ADLs  Comments: wife helps with LB wound care and dressing     Extremity/Trunk Assessment   Upper Extremity Assessment Upper Extremity Assessment: Overall WFL for tasks assessed;Right hand dominant    Lower Extremity Assessment Lower Extremity Assessment: Generalized weakness (swelling BLE's)    Cervical / Trunk Assessment Cervical / Trunk Assessment: Other exceptions Cervical / Trunk Exceptions: increased body habitus  Communication   Communication Communication: No apparent difficulties  Cognition Arousal: Alert Behavior During Therapy: WFL for tasks assessed/performed Overall Cognitive Status: Within Functional Limits for tasks assessed                                          General Comments      Exercises General Exercises - Lower Extremity Ankle Circles/Pumps: AROM, Both, 10 reps, Seated   Assessment/Plan    PT Assessment Patient needs continued PT services  PT Problem List Decreased strength;Decreased activity tolerance;Decreased balance;Decreased mobility;Obesity;Cardiopulmonary status limiting activity       PT Treatment Interventions DME instruction;Gait training;Stair training;Functional mobility training;Therapeutic activities;Therapeutic exercise;Balance training;Patient/family education    PT Goals (Current goals can be found in the Care Plan section)  Acute Rehab PT Goals Patient Stated Goal: return home PT Goal Formulation: With patient Time For Goal Achievement: 07/03/23 Potential to Achieve Goals: Good    Frequency Min 1X/week     Co-evaluation               AM-PAC PT "6 Clicks" Mobility  Outcome Measure Help needed turning from your back to your side while in a flat bed without using bedrails?: None Help needed moving from lying on your back to sitting on the side of a flat bed without using bedrails?: None Help needed moving to and from a bed to a chair (including a wheelchair)?: A Little Help needed standing up from a  chair using your arms (e.g., wheelchair or bedside chair)?: A Little Help needed to walk in hospital room?: A Little Help needed climbing 3-5 steps with a railing? : A Lot 6 Click Score: 19    End of Session Equipment Utilized During Treatment: Oxygen Activity Tolerance: Patient tolerated treatment well Patient left: in chair;with call bell/phone within reach Nurse Communication: Mobility status PT Visit Diagnosis: Muscle weakness (generalized) (M62.81);Pain;Difficulty in walking, not elsewhere classified (R26.2) Pain - Right/Left:  (bilateral) Pain - part of body: Ankle and joints of foot    Time: 8295-6213 PT Time Calculation (min) (ACUTE ONLY): 36 min   Charges:   PT Evaluation $PT Eval Moderate Complexity: 1 Mod PT Treatments $Gait Training: 8-22 mins PT General Charges $$ ACUTE PT VISIT: 1 Visit         Lyanne Co, PT  Acute Rehab Services Secure chat preferred Office [phone]   Lawana Chambers Josemanuel Eakins 06/19/2023, 11:31 AM

## 2023-06-19 NOTE — Hospital Course (Signed)
[name] was admitted to the hospital with the working diagnosis of decompensated heart failure.   67 yo male with the past medical history of hypertension, heart failure, coronary artery disease, COPD, and follicular lymphoma who presented with edema. Reported worsening lower extremity edema over 2 weeks, to the point where he noticed edema in his abdomen and his legs developed blisters. Very weak and deconditioned at home with multiple falls. On his initial physical examination his blood pressure was 143/66, HR 72, RR 26 and 02 saturation 98%, lungs with decreased breath sounds and bilateral rales, no wheezing, heart with S1 and S2 present and regular with no gallops, rubs or murmurs, abdomen with no distention and positive lower extremity edema.   Na 137, K 4,2 Cl 84, bicarbonate 40, glucose 91, bun 42 cr 1,96  BNP 2,104 High sensitive troponin 97 and 88  Wbc 7,6 hgb 9,2 plt 136   Chest radiograph with cardiomegaly, bilateral hilar vascular congestion and cephalization of the vasculature, bilateral small pleural effusions. Sternotomy wires in place.  EKG with 73 bpm, left axis deviation, right bundle branch block, atrial tachycardia with no significant ST segment or T wave changes, positive PVCs, positive significant artifact.

## 2023-06-19 NOTE — Assessment & Plan Note (Signed)
Follow up as outpatient.  

## 2023-06-19 NOTE — Progress Notes (Signed)
Echo attempted at 1148. Pt in chair eating. Will attempt again later.

## 2023-06-19 NOTE — Assessment & Plan Note (Signed)
Calculated BMI is 35.5 consistent with obesity class 2.

## 2023-06-19 NOTE — Assessment & Plan Note (Addendum)
No chest pain. Elevation in high sensitive troponin due to heart failure. Ruled out acute coronary syndrome.  Continue statin therapy Now on apixaban, discontinue aspirin and clopidogrel.

## 2023-06-19 NOTE — Assessment & Plan Note (Addendum)
04/2022 echocardiogram with preserved LV systolic function EF 60 to 65%, mild LVH, RV systolic function preserved, RV with mild enlargement, RVSP 47,4 mmHg, LA and RA with mild dilatation, mild to moderate TR,   Urine output 1,150 ml Systolic blood pressure 124 to 109 mmHg.   Plan to continue diuresis with furosemide 80 mg bid and SGLT 2 inh to augment diuresis.   Acute on chronic hypoxemic respiratory failure due to acute cardiogenic pulmonary edema.  Continue diuresis and supplemental 02 per [place].  02 saturation today is 90% on 2 L/min per .

## 2023-06-19 NOTE — Assessment & Plan Note (Signed)
-   PT and OT

## 2023-06-19 NOTE — Plan of Care (Signed)

## 2023-06-19 NOTE — Progress Notes (Incomplete)
PROGRESS NOTE    [name]  WUJ:[id number] DOB: Feb 11, 1956 DOA: 06/18/2023 PCP: Tresa Garter, MD  67/M w hypertension, diastolic CHF, CAD, COPD, follicular lymphoma presented to the ED with worsening swelling of lower extremities extending to abdomen, developed blisters as well  -Patient takes torsemide 3 times a week instead of daily on account of frequent urination -Recent falls, dizziness -In the ED tachypneic O2 sats 80%, hemoglobin 9.2, creatinine 1.9, BNP 09/14/2002, troponin 97, 88   Subjective:   Assessment and Plan:  Acute on chronic diastolic CHF (congestive heart failure) (HCC) Acute hypoxic respiratory failure - last echo 9/23 noted EF 60 to 65%, mild LVH, RV preserved, RV  -Continue IV Lasix, SGLT2i -Cards following -Wean O2  Atrial flutter (HCC) -Started on metoprolol and apixaban CHA2DS2 vasc score is elevated at 5.  -Cards following, possible cardioversion this admission  Acute kidney injury superimposed on chronic kidney disease (HCC) CKD stage 3a -continue diuresis and follow up renal function in am.   Essential hypertension -Improving, meds as above  CAD S/P percutaneous coronary angioplasty Elevated troponin -Suspect demand ischemia in the setting of above, continue apixaban, statin  COPD mixed type (HCC) No clinical signs of exacerbation Continue bronchodilator therapy.   History of follicular lymphoma Follow up as outpatient.   Open wound of lower extremity without complication, subsequent encounter Continue wound care.   Thrombocytopenia (HCC) Stable follow up cell count as outpatient.   Obesity (BMI 30-39.9) Calculated BMI is 35.5 consistent with obesity class 2.   Falls PT and OT  DVT prophylaxis: apixaban Code Status: Full code Family Communication: Disposition Plan:   Consultants:    Procedures:   Antimicrobials:    Objective: Vitals:   06/19/23 0511 06/19/23 0710 06/19/23 0944 06/19/23 1048  BP:   124/60 (!) 109/51 (!) 118/54  Pulse:  74 75 75  Resp:  18  18  Temp:  98.5 F (36.9 C)  98.2 F (36.8 C)  TempSrc:  Oral  Oral  SpO2:  100%  90%  Weight: 119 kg     Height:        Intake/Output Summary (Last 24 hours) at 06/19/2023 1421 Last data filed at 06/19/2023 1344 Gross per 24 hour  Intake 243 ml  Output 3550 ml  Net -3307 ml   Filed Weights   06/18/23 1156 06/19/23 0511  Weight: 118.4 kg 119 kg    Examination:     Data Reviewed:   CBC: Recent Labs  Lab 06/18/23 1213  WBC 7.6  HGB 9.2*  HCT 31.3*  MCV 96.9  PLT 136*   Basic Metabolic Panel: Recent Labs  Lab 06/18/23 1213 06/19/23 0517  NA 137 139  K 4.2 3.8  CL 84* 85*  CO2 40* 43*  GLUCOSE 91 111*  BUN 42* 43*  CREATININE 1.96* 1.76*  CALCIUM 8.8* 8.5*   GFR: Estimated Creatinine Clearance: 52.8 mL/min (A) (by C-G formula based on SCr of 1.76 mg/dL (H)). Liver Function Tests: Recent Labs  Lab 06/18/23 1351  AST 26  ALT 15  ALKPHOS 81  BILITOT 1.1  PROT 6.8  ALBUMIN 3.5   No results for input(s): "LIPASE", "AMYLASE" in the last 168 hours. No results for input(s): "AMMONIA" in the last 168 hours. Coagulation Profile: No results for input(s): "INR", "PROTIME" in the last 168 hours. Cardiac Enzymes: No results for input(s): "CKTOTAL", "CKMB", "CKMBINDEX", "TROPONINI" in the last 168 hours. BNP (last 3 results) No results for input(s): "PROBNP" in the last 8760 hours.  HbA1C: No results for input(s): "HGBA1C" in the last 72 hours. CBG: No results for input(s): "GLUCAP" in the last 168 hours. Lipid Profile: No results for input(s): "CHOL", "HDL", "LDLCALC", "TRIG", "CHOLHDL", "LDLDIRECT" in the last 72 hours. Thyroid Function Tests: No results for input(s): "TSH", "T4TOTAL", "FREET4", "T3FREE", "THYROIDAB" in the last 72 hours. Anemia Panel: No results for input(s): "VITAMINB12", "FOLATE", "FERRITIN", "TIBC", "IRON", "RETICCTPCT" in the last 72 hours. Urine analysis:    Component  Value Date/Time   COLORURINE YELLOW 02/21/2023 1201   APPEARANCEUR CLEAR 02/21/2023 1201   LABSPEC 1.015 02/21/2023 1201   PHURINE 8.5 (A) 02/21/2023 1201   GLUCOSEU NEGATIVE 02/21/2023 1201   HGBUR TRACE-INTACT (A) 02/21/2023 1201   BILIRUBINUR NEGATIVE 02/21/2023 1201   KETONESUR NEGATIVE 02/21/2023 1201   PROTEINUR NEGATIVE 03/16/2008 1010   UROBILINOGEN 1.0 02/21/2023 1201   NITRITE NEGATIVE 02/21/2023 1201   LEUKOCYTESUR NEGATIVE 02/21/2023 1201   Sepsis Labs: @LABRCNTIP (procalcitonin:4,lacticidven:4)  )No results found for this or any previous visit (from the past 240 hour(s)).   Radiology Studies: DG Chest Port 1 View  Result Date: 06/18/2023 CLINICAL DATA:  Cough. EXAM: PORTABLE CHEST 1 VIEW COMPARISON:  Chest radiograph dated 02/17/2020 and CT dated 05/15/2022. FINDINGS: There is cardiomegaly with vascular congestion and edema. Pneumonia is not excluded. There is bilateral calcified pleural plaques. Trace bilateral pleural effusions versus pleural thickening. There is mild cardiomegaly. Atherosclerotic calcification of the aortic arch. Median sternotomy wires and CABG vascular clips. No acute osseous pathology. IMPRESSION: 1. Cardiomegaly with vascular congestion and edema. Pneumonia is not excluded. 2. Bilateral calcified pleural plaques. Electronically Signed   By: Elgie Collard M.D.   On: 06/18/2023 16:10     Scheduled Meds:  apixaban  5 mg Oral BID   clonazePAM  1 mg Oral QHS   dapagliflozin propanediol  10 mg Oral Daily   furosemide  80 mg Intravenous BID   metoprolol succinate  12.5 mg Oral Daily   simvastatin  20 mg Oral QHS   sodium chloride flush  3 mL Intravenous Q12H   Continuous Infusions:  sodium chloride       LOS: 1 day    Time spent: 6    Zannie Cove, MD [facility]   06/19/2023, 2:21 PM

## 2023-06-20 ENCOUNTER — Inpatient Hospital Stay (HOSPITAL_COMMUNITY): Payer: Medicare Other

## 2023-06-20 DIAGNOSIS — I5033 Acute on chronic diastolic (congestive) heart failure: Secondary | ICD-10-CM | POA: Diagnosis not present

## 2023-06-20 LAB — BASIC METABOLIC PANEL
BUN: 35 mg/dL — ABNORMAL HIGH (ref 8–23)
CO2: >45 mmol/L — ABNORMAL HIGH (ref 22–32)
Calcium: 8.4 mg/dL — ABNORMAL LOW (ref 8.9–10.3)
Chloride: 83 mmol/L — ABNORMAL LOW (ref 98–111)
Creatinine, Ser: 1.58 mg/dL — ABNORMAL HIGH (ref 0.61–1.24)
GFR, Estimated: 47 mL/min — ABNORMAL LOW (ref >60)
Glucose, Bld: 108 mg/dL — ABNORMAL HIGH (ref 70–99)
Potassium: 3.4 mmol/L — ABNORMAL LOW (ref 3.5–5.1)
Sodium: 140 mmol/L (ref 135–145)

## 2023-06-20 LAB — MAGNESIUM: Magnesium: 2.1 mg/dL (ref 1.7–2.4)

## 2023-06-20 MED ORDER — POTASSIUM CHLORIDE 20 MEQ PO PACK
40.0000 meq | PACK | Freq: Two times a day (BID) | ORAL | Status: AC
  Administered 2023-06-20 (×2): 40 meq via ORAL
  Filled 2023-06-20 (×2): qty 2

## 2023-06-20 MED ORDER — SPIRONOLACTONE 12.5 MG HALF TABLET
12.5000 mg | ORAL_TABLET | Freq: Every day | ORAL | Status: DC
  Administered 2023-06-20 – 2023-06-22 (×3): 12.5 mg via ORAL
  Filled 2023-06-20 (×3): qty 1

## 2023-06-20 MED ORDER — ACETAMINOPHEN 325 MG PO TABS
650.0000 mg | ORAL_TABLET | ORAL | Status: DC | PRN
  Administered 2023-06-26: 650 mg via ORAL
  Filled 2023-06-20: qty 2

## 2023-06-20 NOTE — TOC Initial Note (Signed)
Transition of Care [facility]) - Initial/Assessment Note    Patient Details  Name: [name] MRN: [id number] Date of Birth: [birth year]  [facility] CM/SW Contact:    Leone Haven, RN Phone Number: 06/20/2023, 12:00 PM  Clinical Narrative:                 From home with spouse, has PCP and insurance on file, states has no HH services in place at this time , he has home oxygen 2 liters with [employer].  States wife will transport him  home at [employer] and family is support system, states [facility] [address]  Pta self ambulatory.   Expected Discharge Plan: Home/Self Care Barriers to Discharge: Continued Medical Work up   Patient Goals and CMS Choice Patient states their goals for this hospitalization and ongoing recovery are:: return home with wife   Choice offered to / list presented to : NA      Expected Discharge Plan and Services In-house Referral: NA Discharge Planning Services: CM Consult Post Acute Care Choice: NA Living arrangements for the past 2 months: Single Family Home                 DME Arranged: N/A DME Agency: NA       HH Arranged: NA          Prior Living Arrangements/Services Living arrangements for the past 2 months: Single Family Home Lives with:: Spouse Patient language and need for interpreter reviewed:: Yes Do you feel safe going back to the place where you live?: Yes      Need for Family Participation in Patient Care: Yes (Comment) Care giver support system in place?: Yes (comment) Current home services: DME (home oxygen 2 liters with [employer]) Criminal Activity/Legal Involvement Pertinent to Current Situation/Hospitalization: No - Comment as needed  Activities of Daily Living   ADL Screening (condition at time of admission) Independently performs ADLs?: Yes (appropriate for developmental age) Is the patient deaf or have difficulty hearing?: Yes Does the patient have difficulty seeing, even when  wearing glasses/contacts?: Yes Does the patient have difficulty concentrating, remembering, or making decisions?: Yes  Permission Sought/Granted Permission sought to share information with : Case Manager Permission granted to share information with : Yes, Verbal Permission Granted              Emotional Assessment Appearance:: Appears stated age Attitude/Demeanor/Rapport: Engaged Affect (typically observed): Appropriate Orientation: : Oriented to Self, Oriented to Place, Oriented to  Time, Oriented to Situation   Psych Involvement: No (comment)  Admission diagnosis:  Anasarca [R60.1] Acute on chronic diastolic CHF (congestive heart failure) (HCC) [I50.33] Acute on chronic congestive heart failure, unspecified heart failure type (HCC) [I50.9] Patient Active Problem List   Diagnosis Date Noted   Acute kidney injury superimposed on chronic kidney disease (HCC) 06/19/2023   Atrial flutter (HCC) 06/19/2023   Acute on chronic diastolic CHF (congestive heart failure) (HCC) 06/18/2023   Chronic respiratory failure with hypoxia (HCC) 06/18/2023   Falls 06/18/2023   History of follicular lymphoma 06/18/2023   Open wound of lower extremity without complication, subsequent encounter 06/18/2023   Obesity (BMI 30-39.9) 06/18/2023   Fall 05/24/2023   Dizzinesses 05/24/2023   Deficiency anemia 05/22/2023   CKD (chronic kidney disease), stage III (HCC) 05/22/2023   Hip pain, chronic 02/21/2023   Mediastinal adenopathy 07/13/2022   GERD (gastroesophageal reflux disease) 05/11/2022   Constipation 05/11/2022   History of COVID-19 06/21/2021  Aortic atherosclerosis (HCC) 12/20/2020   Peripheral neuropathy 06/22/2020   Leukocytosis 04/27/2020   Weight loss, abnormal 02/17/2020   CAD (coronary artery disease) 12/14/2017   CAD S/P percutaneous coronary angioplasty 12/08/2017   Unstable angina (HCC)    Acute respiratory failure with hypoxia (HCC) 11/15/2017   COPD with acute exacerbation  (HCC) 11/15/2017   Port catheter in place 01/12/2017   Skin lesion of back 04/28/2016   Low back pain radiating to both legs 09/28/2015   Boil of upper extremity 08/20/2015   Bilateral leg ulcer (HCC) 06/25/2015   URI (upper respiratory infection) 04/26/2015   Rash 04/26/2015   Basal cell carcinoma of back 04/08/2015   Well adult exam 04/01/2015   Cellulitis of hand, left 02/11/2015   Vasculitis (HCC)    Cellulitis of right lower extremity    Erythroderma    Purpura (HCC)    RMSF [facility] spotted fever)    Chronic diastolic CHF (congestive heart failure) (HCC)    Thrombocytopenia (HCC)    Hx of CABG    Bilateral lower extremity edema 01/12/2015   Hypersensitivity reaction 01/02/2015   Asymptomatic hyperuricemia 12/30/2014   Follicular lymphoma (HCC) 12/21/2014   Mass of neck 12/11/2014    Class: Chronic   Morbid obesity (HCC) 12/11/2014   Diabetes mellitus type 2, controlled (HCC) 12/11/2014   Depression 12/11/2014   Neoplasm of uncertain behavior of skin 09/25/2014   Neck mass 07/28/2014   Insomnia 03/25/2014   Erectile dysfunction 09/22/2013   Chronic venous insufficiency 02/25/2013   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 [place] [address] Phone: [phone] Fax: [phone]  [facility] - [place], [place] - [address] [place] [address] Phone: [phone] Fax:  [phone]     Social Determinants of Health (SDOH) Social History: SDOH Screenings   Food Insecurity: No Food Insecurity (06/18/2023)  Housing: Low Risk  (06/18/2023)  Transportation Needs: No Transportation Needs (06/18/2023)  Utilities: Not At Risk (06/18/2023)  Alcohol Screen: Low Risk  (03/05/2023)  Depression (PHQ2-9): Medium Risk (06/11/2023)  Financial Resource Strain: Low Risk  (03/05/2023)  Physical Activity: Inactive (06/11/2023)  Social Connections: Moderately Integrated (03/05/2023)  Stress: Stress Concern Present (06/11/2023)  Tobacco Use: Medium Risk (06/18/2023)  Health Literacy: Adequate Health Literacy (03/05/2023)   SDOH Interventions:     Readmission Risk Interventions     No data to display

## 2023-06-20 NOTE — Progress Notes (Signed)
[name]   DOB:21-Feb-1956   JX#:[id number]    ASSESSMENT & PLAN:  History of follicular lymphoma PET/CT imaging showed no evidence of active disease I reassured the patient I will see him back in 1 year for further follow-up  Multiple medical comorbidities including emphysema, congestive heart failure, poor wound healing and others Will defer to primary service They are unrelated to his history of lymphoma  I will get scheduler to call him and schedule outpatient follow-up in a year  All questions were answered. The patient knows to call the clinic with any problems, questions or concerns.   The total time spent in the appointment was 25 minutes encounter with patients including review of chart and various tests results, discussions about plan of care and coordination of care plan  Artis Delay, MD 06/20/2023 4:11 PM  Subjective:  Patient is well-known to me.  He supposed to see me tomorrow to review test results.  He was seen in the clinic on October 8 with multiple major issues including abnormal weight loss.  Appointment was canceled due to admission to the hospital.  I saw the patient, wife is not by the bedside.  I have reviewed his chart and discussed test result with him  Objective:  Vitals:   06/20/23 1257 06/20/23 1528  BP:  115/63  Pulse: 76 79  Resp:  (!) 21  Temp:  99.8 F (37.7 C)  SpO2: 99% 98%     Intake/Output Summary (Last 24 hours) at 06/20/2023 1611 Last data filed at 06/20/2023 1300 Gross per 24 hour  Intake 183 ml  Output 6110 ml  Net -5927 ml    GENERAL:alert, no distress and comfortable    Labs:  Recent Labs    02/21/23 1201 05/18/23 1512 05/22/23 1156 06/18/23 1213 06/18/23 1351 06/19/23 0517 06/20/23 0315  NA 137   < > 140 137  --  139 140  K 4.4   < > 3.7 4.2  --  3.8 3.4*  CL 84*   < > 87* 84*  --  85* 83*  CO2 47*   < > >45* 40*  --  43* >45*  GLUCOSE 105*   < > 99 91  --  111* 108*  BUN 24*   < > 31* 42*  --  43* 35*   CREATININE 1.15   < > 1.30* 1.96*  --  1.76* 1.58*  CALCIUM 9.8   < > 9.9 8.8*  --  8.5* 8.4*  GFRNONAA  --   --  59* 36*  --  41* 47*  PROT 7.5  --  7.3  --  6.8  --   --   ALBUMIN 4.2  --  4.0  --  3.5  --   --   AST 20  --  22  --  26  --   --   ALT 10  --  13  --  15  --   --   ALKPHOS 112  --  90  --  81  --   --   BILITOT 0.8  --  0.8  --  1.1  --   --   BILIDIR  --   --   --   --  0.4*  --   --   IBILI  --   --   --   --  0.7  --   --    < > = values in this interval not displayed.    Studies:  NM PET Image Restage (PS) Skull Base to Thigh (F-18 FDG)  Result Date: 06/20/2023 CLINICAL DATA:  Subsequent treatment strategy for follicular lymphoma. EXAM: NUCLEAR MEDICINE PET SKULL BASE TO THIGH TECHNIQUE: 12.6 mCi F-18 FDG was injected intravenously. Full-ring PET imaging was performed from the skull base to thigh after the radiotracer. CT data was obtained and used for attenuation correction and anatomic localization. Fasting blood glucose: 99 mg/dl COMPARISON:  14/78/2956. FINDINGS: Mediastinal blood pool activity: SUV max 2.1 Liver activity: SUV max 3.6 NECK: No abnormal hypermetabolism. Incidental CT findings: None. CHEST: Hypermetabolic mediastinal lymph nodes with index low right paratracheal lymph node measuring 11 mm (4/62), SUV max 2.7. No additional abnormal hypermetabolism. Incidental CT findings: Atherosclerotic calcification of the aorta. Enlarged pulmonic trunk and heart. No pericardial effusion. Bilateral pleural calcifications and pleural thickening. Trace bilateral pleural effusions. ABDOMEN/PELVIS: Small gastrohepatic ligament lymph nodes show mild hypermetabolism, SUV max 2.6. Small retroperitoneal lymph nodes with index left periaortic lymph node measuring 10 mm (4/124), SUV max 2.7. Small lymph nodes in the small bowel mesentery, SUV max 2.5. Findings are similar to 06/19/2022. Incidental CT findings: Liver margin is irregular. Gallstones. Right adrenal gland is  unremarkable. Left adrenal thickening. No specific follow-up necessary. Bilateral renal stones. Spleen, pancreas, stomach and bowel are grossly unremarkable. Small ascites. SKELETON: No abnormal hypermetabolism. Incidental CT findings: Degenerative changes in the spine. IMPRESSION: 1. Minimally hypermetabolic lymph nodes in the chest and abdomen ([place] 3), stable from 06/19/2022. 2. Trace bilateral pleural effusions. 3. Cirrhosis. 4. Cholelithiasis. 5. Bilateral renal stones. 6. Small ascites. 7. Asbestos related pleural disease. 8.  Aortic atherosclerosis (ICD10-I70.0). 9. Enlarged pulmonic trunk, indicative of pulmonary arterial hypertension. Electronically Signed   By: Leanna Battles M.D.   On: 06/20/2023 13:15   ECHOCARDIOGRAM COMPLETE  Result Date: 06/19/2023    ECHOCARDIOGRAM REPORT   Patient Name:   [name] Date of Exam: 06/19/2023 Medical Rec #:  [id number]           Height:       72.0 in Accession #:    [id number]          Weight:       262.3 lb Date of Birth:  24-Jun-1956           BSA:          2.391 m Patient Age:    67 years            BP:           108/39 mmHg Patient Gender: M                   HR:           80 bpm. Exam Location:  Inpatient Procedure: 2D Echo, Color Doppler and Cardiac Doppler Indications:    CHF  History:        Patient has prior history of Echocardiogram examinations, most                 recent 04/25/2022. CHF, Previous Myocardial Infarction and CAD,                 COPD and Carotid Disease; Risk Factors:Sleep Apnea, Obesity,                 Hypertension, Dyslipidemia and Diabetes. CKD.  Sonographer:    Milda Smart Referring Phys: [id number] RONDELL A Uppal IMPRESSIONS  1. Left ventricular ejection fraction, by estimation, is 55 to 60%. The left ventricle has normal  function. The left ventricle has no regional wall motion abnormalities. There is moderate concentric left ventricular hypertrophy. Indeterminate diastolic filling due to E-A fusion. There is the  interventricular septum is flattened in systole and diastole, consistent with right ventricular pressure and volume overload.  2. Right ventricular systolic function is moderately reduced. The right ventricular size is severely enlarged. There is severely elevated pulmonary artery systolic pressure. The estimated right ventricular systolic pressure is 68.6 mmHg.  3. Left atrial size was mildly dilated.  4. Right atrial size was mildly dilated.  5. The mitral valve is grossly normal. Mild mitral valve regurgitation. No evidence of mitral stenosis.  6. Tricuspid valve regurgitation is moderate to severe.  7. The aortic valve is tricuspid. Aortic valve regurgitation is not visualized. No aortic stenosis is present.  8. The inferior vena cava is dilated in size with <50% respiratory variability, suggesting right atrial pressure of 15 mmHg. Comparison(s): The right ventricular systolic function is worse. RVSP severely elevated. Conclusion(s)/Recommendation(s): Findings consistent with Cor Pulmonale. FINDINGS  Left Ventricle: Left ventricular ejection fraction, by estimation, is 55 to 60%. The left ventricle has normal function. The left ventricle has no regional wall motion abnormalities. The left ventricular internal cavity size was normal in size. There is  moderate concentric left ventricular hypertrophy. The interventricular septum is flattened in systole and diastole, consistent with right ventricular pressure and volume overload. Indeterminate diastolic filling due to E-A fusion. Right Ventricle: The right ventricular size is severely enlarged. No increase in right ventricular wall thickness. Right ventricular systolic function is moderately reduced. There is severely elevated pulmonary artery systolic pressure. The tricuspid regurgitant velocity is 3.66 m/s, and with an assumed right atrial pressure of 15 mmHg, the estimated right ventricular systolic pressure is 68.6 mmHg. Left Atrium: Left atrial size was mildly  dilated. Right Atrium: Right atrial size was mildly dilated. Pericardium: There is no evidence of pericardial effusion. Mitral Valve: The mitral valve is grossly normal. Mild mitral valve regurgitation. No evidence of mitral valve stenosis. MV peak gradient, 10.5 mmHg. The mean mitral valve gradient is 3.0 mmHg. Tricuspid Valve: The tricuspid valve is grossly normal. Tricuspid valve regurgitation is moderate to severe. No evidence of tricuspid stenosis. Aortic Valve: The aortic valve is tricuspid. Aortic valve regurgitation is not visualized. No aortic stenosis is present. Pulmonic Valve: The pulmonic valve was grossly normal. Pulmonic valve regurgitation is not visualized. No evidence of pulmonic stenosis. Aorta: The aortic root and ascending aorta are structurally normal, with no evidence of dilitation. Venous: The inferior vena cava is dilated in size with less than 50% respiratory variability, suggesting right atrial pressure of 15 mmHg. IAS/Shunts: The atrial septum is grossly normal.  LEFT VENTRICLE PLAX 2D LVIDd:         5.50 cm      Diastology LVIDs:         4.20 cm      LV e' medial:    4.35 cm/s LV PW:         1.38 cm      LV E/e' medial:  33.6 LV IVS:        1.40 cm      LV e' lateral:   12.40 cm/s LVOT diam:     2.50 cm      LV E/e' lateral: 11.8 LV SV:         102 LV SV Index:   43 LVOT Area:     4.91 cm  LV Volumes (MOD) LV vol d,  MOD A2C: 143.0 ml LV vol d, MOD A4C: 139.0 ml LV vol s, MOD A2C: 58.5 ml LV vol s, MOD A4C: 62.2 ml LV SV MOD A2C:     84.5 ml LV SV MOD A4C:     139.0 ml LV SV MOD BP:      84.2 ml RIGHT VENTRICLE RV Basal diam:  5.50 cm RV Mid diam:    4.00 cm RV S prime:     8.38 cm/s TAPSE (M-mode): 1.8 cm LEFT ATRIUM             Index        RIGHT ATRIUM           Index LA diam:        5.60 cm 2.34 cm/m   RA Area:     27.40 cm LA Vol (A2C):   76.6 ml 32.04 ml/m  RA Volume:   91.60 ml  38.31 ml/m LA Vol (A4C):   73.2 ml 30.64 ml/m LA Biplane Vol: 87.2 ml 36.47 ml/m  AORTIC VALVE  LVOT Vmax:   104.00 cm/s LVOT Vmean:  74.200 cm/s LVOT VTI:    0.208 m  AORTA Ao Root diam: 3.50 cm Ao Asc diam:  3.10 cm MITRAL VALVE                TRICUSPID VALVE MV Area (PHT): 3.81 cm     TR Peak grad:   53.6 mmHg MV Area VTI:   2.63 cm     TR Mean grad:   33.0 mmHg MV Peak grad:  10.5 mmHg    TR Vmax:        366.00 cm/s MV Mean grad:  3.0 mmHg     TR Vmean:       270.0 cm/s MV Vmax:       1.62 m/s MV Vmean:      85.8 cm/s    SHUNTS MV Decel Time: 199 msec     Systemic VTI:  0.21 m MR Peak grad: 64.6 mmHg     Systemic Diam: 2.50 cm MR Vmax:      402.00 cm/s MV E velocity: 146.00 cm/s Lennie Odor MD Electronically signed by Lennie Odor MD Signature Date/Time: 06/19/2023/4:22:24 PM    Final    CT HEAD WO CONTRAST ( )  Result Date: 06/18/2023 CLINICAL DATA:  Head trauma, status post fall, dizziness, ataxia EXAM: CT HEAD WITHOUT CONTRAST TECHNIQUE: Contiguous axial images were obtained from the base of the skull through the vertex without intravenous contrast. RADIATION DOSE REDUCTION: This exam was performed according to the departmental dose-optimization program which includes automated exposure control, adjustment of the mA and/or kV according to patient size and/or use of iterative reconstruction technique. COMPARISON:  None Available. FINDINGS: Brain: No evidence of acute infarction, hemorrhage, mass, mass effect, or midline shift. No hydrocephalus or extra-axial fluid collection. Gray-white differentiation is preserved. The basilar cisterns are patent. Vascular: No hyperdense vessel. Atherosclerotic calcifications in the intracranial carotid and vertebral arteries. Skull: Negative for fracture or focal lesion. Sinuses/Orbits: Minimal mucosal thickening in the ethmoid air cells. No acute finding in the orbits. Status post bilateral lens replacements. Other: The mastoid air cells are well aerated. IMPRESSION: No acute intracranial process. Electronically Signed   By: Wiliam Ke M.D.   On:  06/18/2023 20:07   DG Chest Port 1 View  Result Date: 06/18/2023 CLINICAL DATA:  Cough. EXAM: PORTABLE CHEST 1 VIEW COMPARISON:  Chest radiograph dated 02/17/2020 and CT dated 05/15/2022. FINDINGS: There is  cardiomegaly with vascular congestion and edema. Pneumonia is not excluded. There is bilateral calcified pleural plaques. Trace bilateral pleural effusions versus pleural thickening. There is mild cardiomegaly. Atherosclerotic calcification of the aortic arch. Median sternotomy wires and CABG vascular clips. No acute osseous pathology. IMPRESSION: 1. Cardiomegaly with vascular congestion and edema. Pneumonia is not excluded. 2. Bilateral calcified pleural plaques. Electronically Signed   By: Elgie Collard M.D.   On: 06/18/2023 16:10   VAS [place] LOWER EXTREMITY VENOUS (DVT)  Result Date: 05/25/2023  Lower Venous DVT Study Patient Name:  [name]  Date of Exam:   05/25/2023 Medical Rec #: [id number]            Accession #:    [id number] Date of Birth: 08/31/1955            Patient Gender: M Patient Age:   22 years Exam Location:  [facility] Procedure:      VAS [place] LOWER EXTREMITY VENOUS (DVT) Referring Phys: Macarthur Critchley PLOTNIKOV --------------------------------------------------------------------------------  Indications: Patient reports chronic swelling and pain in both legs when he started his initial cancer treatments seven years ago. He does have SOB on exertion.  Risk Factors: Cancer history; non-Hodgkin's lymphoma; in remission for 7 years. Comparison Study: NA Performing Technologist: Tyna Jaksch RVT  Examination Guidelines: A complete evaluation includes B-mode imaging, spectral Doppler, color Doppler, and power Doppler as needed of all accessible portions of each vessel. Bilateral testing is considered an integral part of a complete examination. Limited examinations for reoccurring indications may be performed as noted. The reflux portion of the exam is performed with the patient in reverse  Trendelenburg.  +---------+---------------+---------+-----------+---------------+--------------+ RIGHT    CompressibilityPhasicitySpontaneityProperties     Thrombus Aging +---------+---------------+---------+-----------+---------------+--------------+ CFV      Full           No       Yes        dilated, 2.7 cm                                                           x 2.8 cm                      +---------+---------------+---------+-----------+---------------+--------------+ SFJ      Full           No       Yes                                      +---------+---------------+---------+-----------+---------------+--------------+ FV Prox  Full           No       Yes                                      +---------+---------------+---------+-----------+---------------+--------------+ FV Mid   Full                                                             +---------+---------------+---------+-----------+---------------+--------------+  FV DistalFull           No       Yes                                      +---------+---------------+---------+-----------+---------------+--------------+ PFV      Full                                                             +---------+---------------+---------+-----------+---------------+--------------+ POP      Full           No       Yes                                      +---------+---------------+---------+-----------+---------------+--------------+ PTV      Full                                                             +---------+---------------+---------+-----------+---------------+--------------+ PERO     Full                                                             +---------+---------------+---------+-----------+---------------+--------------+ Gastroc  Full                                                              +---------+---------------+---------+-----------+---------------+--------------+ GSV      Full           Yes      Yes                                      +---------+---------------+---------+-----------+---------------+--------------+   +---------+---------------+---------+-----------+---------------+--------------+ LEFT     CompressibilityPhasicitySpontaneityProperties     Thrombus Aging +---------+---------------+---------+-----------+---------------+--------------+ CFV      Full           No       Yes        dilated, 1.7 cm                                                           x 1.7 cm                      +---------+---------------+---------+-----------+---------------+--------------+ SFJ      Full  No       Yes                                      +---------+---------------+---------+-----------+---------------+--------------+ FV Prox  Full           No       Yes                                      +---------+---------------+---------+-----------+---------------+--------------+ FV Mid   Full                                                             +---------+---------------+---------+-----------+---------------+--------------+ FV DistalFull           No       Yes                                      +---------+---------------+---------+-----------+---------------+--------------+ PFV      Full                                                             +---------+---------------+---------+-----------+---------------+--------------+ POP      Full           No       Yes                                      +---------+---------------+---------+-----------+---------------+--------------+ PTV      Full                                                             +---------+---------------+---------+-----------+---------------+--------------+ PERO     Full                                                              +---------+---------------+---------+-----------+---------------+--------------+ Gastroc  Full                                                             +---------+---------------+---------+-----------+---------------+--------------+ GSV      Full           No       Yes                                      +---------+---------------+---------+-----------+---------------+--------------+  Left Technical Findings: Venous reflux noted in the popliteal vein.   Summary: BILATERAL: - No evidence of deep vein thrombosis seen in the lower extremities, bilaterally. - No evidence of superficial venous thrombosis in the lower extremities, bilaterally. -No evidence of popliteal cyst, bilaterally. -Pulsatile venous flow; pulsatile lower limb venous Doppler waveforms correlates well with possible increase right atrium pressure; right-sided heart failure. RIGHT: - The common femoral vein appears dilated, measuring 2.7 cm x 2.8 cm.  LEFT: - The common femoral vein appears dilated, measuring 1.7 cm x 1.7 cm. - Venous reflux noted in the popliteal vein.  *See table(s) above for measurements and observations. Electronically signed by Carolynn Sayers on 05/25/2023 at 3:41:11 PM.    Final

## 2023-06-20 NOTE — Plan of Care (Signed)
Pt able to stand at bedside with walker and contact guard assistance

## 2023-06-20 NOTE — Progress Notes (Addendum)
Rounding Note    Patient Name: [name] Date of Encounter: 06/20/2023  [facility] Cardiologist: Charlton Haws, MD   Subjective   No acute overnight events. He reports some mild chest discomfort this morning that felt like his prior reflux but it has resolved. No real significant change in his breathing. He does have severe COPD at baseline and is on 2L of O2 at home. Currently on 4L of O2. He reports his edema is improving but he still has significant anasarca. He also reports he is "jittery" this morning and he does have a tremor.  Inpatient Medications    Scheduled Meds:  apixaban  5 mg Oral BID   clonazePAM  1 mg Oral QHS   dapagliflozin propanediol  10 mg Oral Daily   furosemide  80 mg Intravenous BID   metoprolol succinate  12.5 mg Oral Daily   potassium chloride  40 mEq Oral BID   simvastatin  20 mg Oral QHS   sodium chloride flush  3 mL Intravenous Q12H   spironolactone  12.5 mg Oral Daily   Continuous Infusions:  PRN Meds: acetaminophen, ondansetron (ZOFRAN) IV, oxyCODONE-acetaminophen, senna-docusate, sodium chloride flush   Vital Signs    Vitals:   06/19/23 1923 06/20/23 0004 06/20/23 0402 06/20/23 0801  BP: 116/62 (!) 124/53 (!) 115/55 (!) 126/104  Pulse: 75 75 77 79  Resp: 18 18 20 20   Temp: 99.1 F (37.3 C) 98.6 F (37 C) 98.4 F (36.9 C) 98.5 F (36.9 C)  TempSrc: Oral Oral Oral Oral  SpO2: 98% 99% 96% 93%  Weight:   115.8 kg   Height:        Intake/Output Summary (Last 24 hours) at 06/20/2023 0827 Last data filed at 06/20/2023 0815 Gross per 24 hour  Intake 183 ml  Output 6860 ml  Net -6677 ml      06/20/2023    4:02 AM 06/19/2023    5:11 AM 06/18/2023   11:56 AM  Last 3 Weights  Weight (lbs) 255 lb 6.4 oz 262 lb 5.6 oz 261 lb  Weight (kg) 115.849 kg 119 kg 118.389 kg      Telemetry    Atrial flutter with rates in the 70s to 80s. - Personally Reviewed  ECG    No new ECG tracing today. - Personally  Reviewed  Physical Exam   GEN: Morbidly obese Caucasian male in no acute distress. On 4L of O2 via nasal cannula. Neck: JVD up to jaw sitting up at 90 degrees.  Cardiac: RRR.  Respiratory: Decreased breath sounds bilateral with expiratory wheezes noted in bases. No significant crackles appreciated.  GI: Soft and distended with pitting edema.  MS: 3+ pitting edema up to thighs. Bilateral lower extremities wrapped. Neuro:  No focal deficits. Psych: Normal affect. Responds appropriately.  Labs    High Sensitivity Troponin:   Recent Labs  Lab 06/18/23 1213 06/18/23 1351 06/19/23 0840  TROPONINIHS 97* 88* 66*     Chemistry Recent Labs  Lab 06/18/23 1213 06/18/23 1351 06/19/23 0517 06/20/23 0315  NA 137  --  139 140  K 4.2  --  3.8 3.4*  CL 84*  --  85* 83*  CO2 40*  --  43* >45*  GLUCOSE 91  --  111* 108*  BUN 42*  --  43* 35*  CREATININE 1.96*  --  1.76* 1.58*  CALCIUM 8.8*  --  8.5* 8.4*  MG  --   --   --  2.1  PROT  --  6.8  --   --   ALBUMIN  --  3.5  --   --   AST  --  26  --   --   ALT  --  15  --   --   ALKPHOS  --  81  --   --   BILITOT  --  1.1  --   --   GFRNONAA 36*  --  41* 47*  ANIONGAP 13  --  11 NOT CALCULATED    Lipids No results for input(s): "CHOL", "TRIG", "HDL", "LABVLDL", "LDLCALC", "CHOLHDL" in the last 168 hours.  Hematology Recent Labs  Lab 06/18/23 1213  WBC 7.6  RBC 3.23*  HGB 9.2*  HCT 31.3*  MCV 96.9  MCH 28.5  MCHC 29.4*  RDW 18.1*  PLT 136*   Thyroid No results for input(s): "TSH", "FREET4" in the last 168 hours.  BNP Recent Labs  Lab 06/18/23 1213  BNP 2,104.7*    DDimer No results for input(s): "DDIMER" in the last 168 hours.   Radiology    ECHOCARDIOGRAM COMPLETE  Result Date: 06/19/2023    ECHOCARDIOGRAM REPORT   Patient Name:   [name] Date of Exam: 06/19/2023 Medical Rec #:  [id number]           Height:       72.0 in Accession #:    [id number]          Weight:       262.3 lb Date of Birth:  1956-06-01            BSA:          2.391 m Patient Age:    67 years            BP:           108/39 mmHg Patient Gender: M                   HR:           80 bpm. Exam Location:  Inpatient Procedure: 2D Echo, Color Doppler and Cardiac Doppler Indications:    CHF  History:        Patient has prior history of Echocardiogram examinations, most                 recent 04/25/2022. CHF, Previous Myocardial Infarction and CAD,                 COPD and Carotid Disease; Risk Factors:Sleep Apnea, Obesity,                 Hypertension, Dyslipidemia and Diabetes. CKD.  Sonographer:    Milda Smart Referring Phys: [id number] RONDELL A Disch IMPRESSIONS  1. Left ventricular ejection fraction, by estimation, is 55 to 60%. The left ventricle has normal function. The left ventricle has no regional wall motion abnormalities. There is moderate concentric left ventricular hypertrophy. Indeterminate diastolic filling due to E-A fusion. There is the interventricular septum is flattened in systole and diastole, consistent with right ventricular pressure and volume overload.  2. Right ventricular systolic function is moderately reduced. The right ventricular size is severely enlarged. There is severely elevated pulmonary artery systolic pressure. The estimated right ventricular systolic pressure is 68.6 mmHg.  3. Left atrial size was mildly dilated.  4. Right atrial size was mildly dilated.  5. The mitral valve is grossly normal. Mild mitral valve regurgitation. No evidence of mitral stenosis.  6. Tricuspid valve regurgitation  is moderate to severe.  7. The aortic valve is tricuspid. Aortic valve regurgitation is not visualized. No aortic stenosis is present.  8. The inferior vena cava is dilated in size with <50% respiratory variability, suggesting right atrial pressure of 15 mmHg. Comparison(s): The right ventricular systolic function is worse. RVSP severely elevated. Conclusion(s)/Recommendation(s): Findings consistent with Cor Pulmonale. FINDINGS   Left Ventricle: Left ventricular ejection fraction, by estimation, is 55 to 60%. The left ventricle has normal function. The left ventricle has no regional wall motion abnormalities. The left ventricular internal cavity size was normal in size. There is  moderate concentric left ventricular hypertrophy. The interventricular septum is flattened in systole and diastole, consistent with right ventricular pressure and volume overload. Indeterminate diastolic filling due to E-A fusion. Right Ventricle: The right ventricular size is severely enlarged. No increase in right ventricular wall thickness. Right ventricular systolic function is moderately reduced. There is severely elevated pulmonary artery systolic pressure. The tricuspid regurgitant velocity is 3.66 m/s, and with an assumed right atrial pressure of 15 mmHg, the estimated right ventricular systolic pressure is 68.6 mmHg. Left Atrium: Left atrial size was mildly dilated. Right Atrium: Right atrial size was mildly dilated. Pericardium: There is no evidence of pericardial effusion. Mitral Valve: The mitral valve is grossly normal. Mild mitral valve regurgitation. No evidence of mitral valve stenosis. MV peak gradient, 10.5 mmHg. The mean mitral valve gradient is 3.0 mmHg. Tricuspid Valve: The tricuspid valve is grossly normal. Tricuspid valve regurgitation is moderate to severe. No evidence of tricuspid stenosis. Aortic Valve: The aortic valve is tricuspid. Aortic valve regurgitation is not visualized. No aortic stenosis is present. Pulmonic Valve: The pulmonic valve was grossly normal. Pulmonic valve regurgitation is not visualized. No evidence of pulmonic stenosis. Aorta: The aortic root and ascending aorta are structurally normal, with no evidence of dilitation. Venous: The inferior vena cava is dilated in size with less than 50% respiratory variability, suggesting right atrial pressure of 15 mmHg. IAS/Shunts: The atrial septum is grossly normal.  LEFT  VENTRICLE PLAX 2D LVIDd:         5.50 cm      Diastology LVIDs:         4.20 cm      LV e' medial:    4.35 cm/s LV PW:         1.38 cm      LV E/e' medial:  33.6 LV IVS:        1.40 cm      LV e' lateral:   12.40 cm/s LVOT diam:     2.50 cm      LV E/e' lateral: 11.8 LV SV:         102 LV SV Index:   43 LVOT Area:     4.91 cm  LV Volumes (MOD) LV vol d, MOD A2C: 143.0 ml LV vol d, MOD A4C: 139.0 ml LV vol s, MOD A2C: 58.5 ml LV vol s, MOD A4C: 62.2 ml LV SV MOD A2C:     84.5 ml LV SV MOD A4C:     139.0 ml LV SV MOD BP:      84.2 ml RIGHT VENTRICLE RV Basal diam:  5.50 cm RV Mid diam:    4.00 cm RV S prime:     8.38 cm/s TAPSE (M-mode): 1.8 cm LEFT ATRIUM             Index        RIGHT ATRIUM  Index LA diam:        5.60 cm 2.34 cm/m   RA Area:     27.40 cm LA Vol (A2C):   76.6 ml 32.04 ml/m  RA Volume:   91.60 ml  38.31 ml/m LA Vol (A4C):   73.2 ml 30.64 ml/m LA Biplane Vol: 87.2 ml 36.47 ml/m  AORTIC VALVE LVOT Vmax:   104.00 cm/s LVOT Vmean:  74.200 cm/s LVOT VTI:    0.208 m  AORTA Ao Root diam: 3.50 cm Ao Asc diam:  3.10 cm MITRAL VALVE                TRICUSPID VALVE MV Area (PHT): 3.81 cm     TR Peak grad:   53.6 mmHg MV Area VTI:   2.63 cm     TR Mean grad:   33.0 mmHg MV Peak grad:  10.5 mmHg    TR Vmax:        366.00 cm/s MV Mean grad:  3.0 mmHg     TR Vmean:       270.0 cm/s MV Vmax:       1.62 m/s MV Vmean:      85.8 cm/s    SHUNTS MV Decel Time: 199 msec     Systemic VTI:  0.21 m MR Peak grad: 64.6 mmHg     Systemic Diam: 2.50 cm MR Vmax:      402.00 cm/s MV E velocity: 146.00 cm/s Lennie Odor MD Electronically signed by Lennie Odor MD Signature Date/Time: 06/19/2023/4:22:24 PM    Final    DG Chest Port 1 View  Result Date: 06/18/2023 CLINICAL DATA:  Cough. EXAM: PORTABLE CHEST 1 VIEW COMPARISON:  Chest radiograph dated 02/17/2020 and CT dated 05/15/2022. FINDINGS: There is cardiomegaly with vascular congestion and edema. Pneumonia is not excluded. There is bilateral calcified  pleural plaques. Trace bilateral pleural effusions versus pleural thickening. There is mild cardiomegaly. Atherosclerotic calcification of the aortic arch. Median sternotomy wires and CABG vascular clips. No acute osseous pathology. IMPRESSION: 1. Cardiomegaly with vascular congestion and edema. Pneumonia is not excluded. 2. Bilateral calcified pleural plaques. Electronically Signed   By: Elgie Collard M.D.   On: 06/18/2023 16:10    Cardiac Studies   Echocardiogram 06/19/2023: Impressions: 1. Left ventricular ejection fraction, by estimation, is 55 to 60%. The  left ventricle has normal function. The left ventricle has no regional  wall motion abnormalities. There is moderate concentric left ventricular  hypertrophy. Indeterminate diastolic  filling due to E-A fusion. There is the interventricular septum is  flattened in systole and diastole, consistent with right ventricular  pressure and volume overload.   2. Right ventricular systolic function is moderately reduced. The right  ventricular size is severely enlarged. There is severely elevated  pulmonary artery systolic pressure. The estimated right ventricular  systolic pressure is 68.6 mmHg.   3. Left atrial size was mildly dilated.   4. Right atrial size was mildly dilated.   5. The mitral valve is grossly normal. Mild mitral valve regurgitation.  No evidence of mitral stenosis.   6. Tricuspid valve regurgitation is moderate to severe.   7. The aortic valve is tricuspid. Aortic valve regurgitation is not  visualized. No aortic stenosis is present.   8. The inferior vena cava is dilated in size with <50% respiratory  variability, suggesting right atrial pressure of 15 mmHg.    Patient Profile     67 y.o. 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   Assessment & Plan    Acute on Chronic HFpEF RV Failure Patient presented with worsening dyspnea, orthopnea, and edema. BNP markedly elevated at 2,104. Chest x-ray showed cardiomegaly with vascular congestion and edema as well as bilateral calcified pleural plaques. Echo showed LVEF of 55-60% with normal wall motion and moderate LVH, severely enlarged RV with moderate reduced RV function and severely elevated PASP of 68.6 mmHg, mild MR, and moderate to severe TR. Also showed interventricular septum flattening in systole and diastole consistent with RV pressure and volume overload. Patient is being diuresed with IV Lasix. Documented urinary output of 6.1 L yesterday and net negative 7 L this admission so far (although I don't think all of his input has been documented). Weight down 7 lbs from yesterday.  Renal function improving with diuresis.  - Still has significant anasarca with 3+ pitting edema extending up to his thighs and abdomen. - Continue IV Lasix 80mg  twice daily. - Continue Farxiga 10mg  daily (added on 11/5).  - Will start Spironolactone 12.5mg  daily.  - Continue to monitor daily weights, strict I/Os, and renal function.  Persistent Atrial Fibrillation Patient was noted to be in atrial flutter on arrival. No formal diagnosis of this prior to admission but it does look like EKGs dating back to 03/2022 have showed atrial flutter.  - Rate controlled. - Continue Toprol-XL 12.5mg  daily. - He has been started on Eliquis 5mg  twice daily. Continue.  - Recommend TEE/ DCCV once he is more euvolemic.   Demand Ischemia High-sensitivity troponin minimally elevated at 97 >> 88 >> 66. Echo showed normal LV function.  - He describes some reflux this morning but nothing that  sounds like angina.  - Troponin elevation consistent with demand ischemia in setting of acute CHF.  CAD S/p CABG x3 in 2005 and then subsequent PCI to SVG to RCA in 2019.  - He describes some reflux this morning but nothing that sounds like angina.  - Aspirin stopped given additional of Eliquis.  - Continue statin.  Hypertension BP soft but stable.  - Continue Toprol-XL 12.5mg  daily.  - Will add Spironolactone 12.5mg  daily.  Hyperlipidemia - Continue Simvastatin 20mg  daily.  Acute on CKD Stage III Creatinine 1.96 on admission. Recent baseline around 1.3. Suspect due to renal congestion - improving with diuresis.  - Creatinine 1.58 today.  Hypokalemia Potassium 3.4 today.  - Will give two dose of KCl 40 mEq today - Will start Spironolactone as above.  Otherwise, per primary team: - Severe COPD on home O2 - Chronically elevated CO2 on BMP - Follicular lymphoma - Open wound of lower extremity - Thrombocytopenia - Falls  For questions or updates, please contact [facility] Please consult [email or web address] for contact info under   Signed, Corrin Parker, PA-C  06/20/2023, 8:27 AM   As above, patient seen and examined.  Had some indigestion earlier now resolved after eating breakfast.  Mild dyspnea.  He remains markedly volume overloaded on examination.  Good diuresis yesterday.  Continue Lasix, Farxiga at present dose and add spironolactone 12.5 mg daily.  Continue to follow renal function (some improvement today).  Severe pulmonary hypertension is likely related to lung disease.  Patient remains in atrial flutter which has been present for at least 1 year.  Apixaban was initiated yesterday.  Would likely proceed with outpatient cardioversion once therapeutically anticoagulated for 3 consecutive weeks.  Supplement potassium.  Olga Millers, MD

## 2023-06-20 NOTE — Discharge Instructions (Signed)
Information on my medicine - ELIQUIS (apixaban)  This medication education was reviewed with me or my healthcare representative as part of my discharge preparation.  The pharmacist that spoke with me during my hospital stay was:  Mosetta Anis, [facility]  Why was Eliquis prescribed for you? Eliquis was prescribed for you to reduce the risk of a blood clot forming that can cause a stroke if you have a medical condition called atrial flutter (a type of irregular heartbeat).  What do You need to know about Eliquis ? Take your Eliquis TWICE DAILY - one tablet in the morning and one tablet in the evening with or without food. If you have difficulty swallowing the tablet whole please discuss with your pharmacist how to take the medication safely.  Take Eliquis exactly as prescribed by your doctor and DO NOT stop taking Eliquis without talking to the doctor who prescribed the medication.  Stopping may increase your risk of developing a stroke.  Refill your prescription before you run out.  After discharge, you should have regular check-up appointments with your healthcare provider that is prescribing your Eliquis.  In the future your dose may need to be changed if your kidney function or weight changes by a significant amount or as you get older.  What do you do if you miss a dose? If you miss a dose, take it as soon as you remember on the same day and resume taking twice daily.  Do not take more than one dose of ELIQUIS at the same time to make up a missed dose.  Important Safety Information A possible side effect of Eliquis is bleeding. You should call your healthcare provider right away if you experience any of the following: Bleeding from an injury or your nose that does not stop. Unusual colored urine (red or dark brown) or unusual colored stools (red or black). Unusual bruising for unknown reasons. A serious fall or if you hit your head (even if there is no bleeding).  Some medicines  may interact with Eliquis and might increase your risk of bleeding or clotting while on Eliquis. To help avoid this, consult your healthcare provider or pharmacist prior to using any new prescription or non-prescription medications, including herbals, vitamins, non-steroidal anti-inflammatory drugs (NSAIDs) and supplements.  This website has more information on Eliquis (apixaban): http://www.eliquis.com/eliquis/home

## 2023-06-20 NOTE — Plan of Care (Signed)
  Problem: Education: Goal: Knowledge of General Education information will improve Description: Including pain rating scale, medication(s)/side effects and non-pharmacologic comfort measures Outcome: Progressing   Problem: Health Behavior/Discharge Planning: Goal: Ability to manage health-related needs will improve Outcome: Progressing   Problem: Clinical Measurements: Goal: Ability to maintain clinical measurements within normal limits will improve Outcome: Progressing Goal: Will remain free from infection Outcome: Progressing   Problem: Activity: Goal: Risk for activity intolerance will decrease Outcome: Progressing   Problem: Coping: Goal: Level of anxiety will decrease Outcome: Progressing   Problem: Pain Management: Goal: General experience of comfort will improve Outcome: Progressing   Problem: Safety: Goal: Ability to remain free from injury will improve Outcome: Progressing   Problem: Skin Integrity: Goal: Risk for impaired skin integrity will decrease Outcome: Progressing

## 2023-06-21 ENCOUNTER — Inpatient Hospital Stay: Payer: Medicare Other | Admitting: Hematology and Oncology

## 2023-06-21 ENCOUNTER — Other Ambulatory Visit (HOSPITAL_COMMUNITY): Payer: Self-pay

## 2023-06-21 ENCOUNTER — Telehealth: Payer: Self-pay | Admitting: Hematology and Oncology

## 2023-06-21 ENCOUNTER — Ambulatory Visit: Payer: Medicare Other | Admitting: Internal Medicine

## 2023-06-21 ENCOUNTER — Encounter (HOSPITAL_COMMUNITY): Payer: Self-pay | Admitting: Internal Medicine

## 2023-06-21 DIAGNOSIS — I5033 Acute on chronic diastolic (congestive) heart failure: Secondary | ICD-10-CM | POA: Diagnosis not present

## 2023-06-21 LAB — CBC WITH DIFFERENTIAL/PLATELET
Abs Immature Granulocytes: 0.03 10*3/uL (ref 0.00–0.07)
Basophils Absolute: 0.1 10*3/uL (ref 0.0–0.1)
Basophils Relative: 1 %
Eosinophils Absolute: 0.1 10*3/uL (ref 0.0–0.5)
Eosinophils Relative: 1 %
HCT: 28.4 % — ABNORMAL LOW (ref 39.0–52.0)
Hemoglobin: 8.2 g/dL — ABNORMAL LOW (ref 13.0–17.0)
Immature Granulocytes: 0 %
Lymphocytes Relative: 7 %
Lymphs Abs: 0.5 10*3/uL — ABNORMAL LOW (ref 0.7–4.0)
MCH: 28.7 pg (ref 26.0–34.0)
MCHC: 28.9 g/dL — ABNORMAL LOW (ref 30.0–36.0)
MCV: 99.3 fL (ref 80.0–100.0)
Monocytes Absolute: 1.8 10*3/uL — ABNORMAL HIGH (ref 0.1–1.0)
Monocytes Relative: 24 %
Neutro Abs: 4.9 10*3/uL (ref 1.7–7.7)
Neutrophils Relative %: 67 %
Platelets: 126 10*3/uL — ABNORMAL LOW (ref 150–400)
RBC: 2.86 MIL/uL — ABNORMAL LOW (ref 4.22–5.81)
RDW: 18.6 % — ABNORMAL HIGH (ref 11.5–15.5)
WBC: 7.4 10*3/uL (ref 4.0–10.5)
nRBC: 0 % (ref 0.0–0.2)

## 2023-06-21 LAB — RESP PANEL BY RT-PCR (RSV, FLU A&B, COVID)  RVPGX2
Influenza A by PCR: NEGATIVE
Influenza B by PCR: NEGATIVE
Resp Syncytial Virus by PCR: NEGATIVE
SARS Coronavirus 2 by RT PCR: NEGATIVE

## 2023-06-21 LAB — BASIC METABOLIC PANEL
BUN: 33 mg/dL — ABNORMAL HIGH (ref 8–23)
CO2: >45 mmol/L — ABNORMAL HIGH (ref 22–32)
Calcium: 8.4 mg/dL — ABNORMAL LOW (ref 8.9–10.3)
Chloride: 81 mmol/L — ABNORMAL LOW (ref 98–111)
Creatinine, Ser: 1.28 mg/dL — ABNORMAL HIGH (ref 0.61–1.24)
GFR, Estimated: >60 mL/min (ref >60)
Glucose, Bld: 116 mg/dL — ABNORMAL HIGH (ref 70–99)
Potassium: 3.9 mmol/L (ref 3.5–5.1)
Sodium: 139 mmol/L (ref 135–145)

## 2023-06-21 LAB — URINALYSIS, ROUTINE W REFLEX MICROSCOPIC
Bacteria, UA: NONE SEEN
Bilirubin Urine: NEGATIVE
Glucose, UA: >=500 mg/dL — AB
Ketones, ur: NEGATIVE mg/dL
Leukocytes,Ua: NEGATIVE
Nitrite: NEGATIVE
Protein, ur: NEGATIVE mg/dL
Specific Gravity, Urine: 1.01 (ref 1.005–1.030)
pH: 6 (ref 5.0–8.0)

## 2023-06-21 MED ORDER — ACETAZOLAMIDE 250 MG PO TABS
250.0000 mg | ORAL_TABLET | Freq: Two times a day (BID) | ORAL | Status: DC
  Administered 2023-06-21 – 2023-06-23 (×6): 250 mg via ORAL
  Filled 2023-06-21 (×6): qty 1

## 2023-06-21 MED ORDER — CARBIDOPA-LEVODOPA 10-100 MG PO TABS
1.0000 | ORAL_TABLET | Freq: Three times a day (TID) | ORAL | Status: DC
  Administered 2023-06-21 – 2023-06-22 (×6): 1 via ORAL
  Filled 2023-06-21 (×7): qty 1

## 2023-06-21 NOTE — Progress Notes (Signed)
Mobility Specialist Progress Note:    06/21/23 1151  Mobility  Activity Ambulated with assistance in room  Level of Assistance Contact guard assist, steadying assist  Assistive Device Front wheel walker  Distance Ambulated (ft) 20 ft  Activity Response Tolerated well  Mobility Referral Yes  $Mobility charge 1 Mobility  Mobility Specialist Start Time (ACUTE ONLY) [id number]  Mobility Specialist Stop Time (ACUTE ONLY) [id number]  Mobility Specialist Time Calculation (min) (ACUTE ONLY) 13 min   Pt received in chair agreeable to mobility. Needed no physical assistance during session but MinA w/ walker management around turns. Attempted to ambulate on 3L/min but d/t desat to 81% O2 flow increased to 5L/min, Spo2 returned to 90%. Returned to chair w/o fault. Call bell and personal belongings in reach. All needs met. Left on 5L/min, RN notified.   Thompson Grayer Mobility Specialist  Please contact vis Secure Chat or  Rehab Office [phone]

## 2023-06-21 NOTE — Progress Notes (Signed)
   Heart Failure Stewardship Pharmacist Progress Note   PCP: Plotnikov, Georgina Quint, MD PCP-Cardiologist: Charlton Haws, MD    HPI:  67 yo M with PMH of CHF, HTN ,HLD, CKD III, obesity, OSA, anemia, COPD, CAD s/p CABG x 3, and follicular lymphoma.   Presented to the ED on 11/4 from his wound care center with shortness of breath and hypoxia. Also reported LE edema in feet and abdomen. Has not been taking his torsemide regularly (only 3 times a week) to avoid frequent urination. CXR with pulmonary edema and cardiomegaly. BNP 2104.7. ECHO 11/5 with EF 55-60% (was 60-65% 04/2022), no RWMA, moderate concentric LVH, RV moderately reduced, severely elevated PA pressure, mild MR.   Current HF Medications: Diuretic: furosemide 80 mg IV BID + diamox 250 mg BID Beta Blocker: metoprolol XL 12.5 mg daily MRA: spironolactone 12.5 mg daily SGLT2i: Farxiga 10 mg daily  Prior to admission HF Medications: Diuretic: torsemide 20 mg daily + metolazone 5 mg daily PRN Beta blocker: metoprolol XL 12.5 mg BID  Pertinent Lab Values: Serum creatinine 1.58>1.28, BUN 33, Potassium 3.9, CO2 >45, Sodium 139, BNP 2104.7, Magnesium 2.1  Vital Signs: Weight: 252 lbs (admission weight: 262 lbs) Blood pressure: 110/60s  Heart rate: 70-80s  I/O: net -6L yesterday; net -11.2L since admission  Medication Assistance / Insurance Benefits Check: Does the patient have prescription insurance?  Yes Type of insurance plan: Medicare + supplement  Outpatient Pharmacy:  Prior to admission outpatient [facility] Is the patient willing to use [facility] TOC pharmacy at discharge? Yes Is the patient willing to transition their outpatient pharmacy to utilize a [facility] outpatient pharmacy?   Pending    Assessment: 1. Acute on chronic diastolic CHF (LVEF 55-60%). NYHA class III-IV symptoms. - Remains fluid overloaded on exam with 3+ edema. Continue furosemide 80 mg IV BID, agree with adding diamox 250 mg BID. Reports he was not  taking metolazone PTA due to orthostasis. Strict I/Os and daily weights. Keep K>4 and Mg>2. - Continue metoprolol XL 12.5 mg daily (reported taking BID PTA) - Continue spironolactone 12.5 mg daily - Continue Farxiga 10 mg daily   Plan: 1) Medication changes recommended at this time: - Continue IV diuresis   2) Patient assistance: - Farxiga copay $8.40 x 90 day supply  3)  Education  - To be completed prior to discharge - patient unable for education at this time. Working with PT/OT.  Sharen Hones, PharmD, BCPS Heart Failure Stewardship Pharmacist Phone [phone]

## 2023-06-21 NOTE — Care Management Important Message (Signed)
Important Message  Patient Details  Name: [name] MRN: [id number] Date of Birth: Jan 02, 1956   Important Message Given:  Yes - Medicare IM     Dorena Bodo 06/21/2023, 1:04 PM

## 2023-06-21 NOTE — Progress Notes (Signed)
Physical Therapy Treatment Patient Details Name: [name] MRN: [id number] DOB: [birth year] Today's Date: 06/21/2023   History of Present Illness Pt is 67 yo male who presents on 06/18/23 with acute on chronic congestive heart failure with progressively worsening swelling over several weeks. PMH: CAD, CABG and PCI, COPD, OSA, CHF, HTN, GERD, CKD3, follicular lymphoma    PT Comments  Pt was able to make progress ambulating an increased distance of up to ~92 ft this date. However, he is at risk for falls due to his BP dropping. See measurements below. Educated pt and wife on ways to try to monitor and manage his BP through changing positions slowly and performing AROM exercises. They verbalized understanding but expressed concern over his BP dropping so low. Notified RN and MD. Will continue to follow acutely.   BP- 109/58 (73) sitting 94/51 (66) standing 77/57 (66) sitting after ambulating ~92 ft 89/66 (75) supine 127/70 (86) supine several minutes  *He did report feeling lightheaded after walking a bit      If plan is discharge home, recommend the following: A little help with walking and/or transfers;A little help with bathing/dressing/bathroom;Assist for transportation;Assistance with cooking/housework;Help with stairs or ramp for entrance   Can travel by private vehicle        Equipment Recommendations  None recommended by PT    Recommendations for Other Services       Precautions / Restrictions Precautions Precautions: Fall Precaution Comments: watch BP Restrictions Weight Bearing Restrictions: No Other Position/Activity Restrictions: B LE wounds, has unna boots     Mobility  Bed Mobility Overal bed mobility: Needs Assistance Bed Mobility: Sit to Supine       Sit to supine: Min assist, HOB elevated   General bed mobility comments: Extra time and light minA to lift legs onto bed with return to supine    Transfers Overall transfer level: Needs  assistance Equipment used: Rollator (4 wheels) Transfers: Sit to/from Stand Sit to Stand: Contact guard assist           General transfer comment: Cues provided for brake application on rollator prior to standing and on hand placement. CGA for safety    Ambulation/Gait Ambulation/Gait assistance: Contact guard assist Gait Distance (Feet): 92 Feet Assistive device: Rollator (4 wheels) Gait Pattern/deviations: Step-through pattern, Wide base of support, Decreased stride length, Trunk flexed Gait velocity: decreased Gait velocity interpretation: <1.31 ft/sec, indicative of household ambulator   General Gait Details: Pt ambulated slowly but steadily. Verbal and tactile cues provided for improved upright posture with momentary success noted. CGA for safety   Stairs             Wheelchair Mobility     Tilt Bed    Modified Rankin (Stroke Patients Only)       Balance Overall balance assessment: Mild deficits observed, not formally tested                                          Cognition Arousal: Alert Behavior During Therapy: WFL for tasks assessed/performed Overall Cognitive Status: Within Functional Limits for tasks assessed                                          Exercises General Exercises - Lower Extremity Ankle Circles/Pumps: AROM, Both, 10  reps, Supine Hip ABduction/ADduction: AROM, Both, 10 reps, Supine    General Comments General comments (skin integrity, edema, etc.): SpO2 stable on 2L, BP-109/58 (73) sitting, 94/51 (66) standing, 77/57 (66) sitting after ambulating ~92 ft, 89/66 (75) supine, 127/70 (86) supine several minutes. He did report feeling lightheaded after walking a bit; educated pt and wife on changing positions slowly, performing AROM of extremities to try to improve BP, monitoring symptoms, use of rollator, and sitting and lying down as needed for safety, they verbalized understanding      Pertinent  Vitals/Pain Pain Assessment Pain Assessment: Faces Faces Pain Scale: Hurts little more Pain Location: Bil feet Pain Descriptors / Indicators: Burning, Discomfort, Grimacing Pain Intervention(s): Monitored during session, Limited activity within patient's tolerance, Repositioned, Patient requesting pain meds-RN notified    Home Living                          Prior Function            PT Goals (current goals can now be found in the care plan section) Acute Rehab PT Goals Patient Stated Goal: return home PT Goal Formulation: With patient/family Time For Goal Achievement: 07/03/23 Potential to Achieve Goals: Good Progress towards PT goals: Progressing toward goals    Frequency    Min 1X/week      PT Plan      Co-evaluation              AM-PAC PT "6 Clicks" Mobility   Outcome Measure  Help needed turning from your back to your side while in a flat bed without using bedrails?: None Help needed moving from lying on your back to sitting on the side of a flat bed without using bedrails?: None Help needed moving to and from a bed to a chair (including a wheelchair)?: A Little Help needed standing up from a chair using your arms (e.g., wheelchair or bedside chair)?: A Little Help needed to walk in hospital room?: A Little Help needed climbing 3-5 steps with a railing? : A Lot 6 Click Score: 19    End of Session Equipment Utilized During Treatment: Oxygen Activity Tolerance: Patient tolerated treatment well;Other (comment) (limited by BP dropping) Patient left: with call bell/phone within reach;in bed;with bed alarm set;with family/visitor present Nurse Communication: Mobility status;Other (comment);Patient requests pain meds (BP) PT Visit Diagnosis: Muscle weakness (generalized) (M62.81);Pain;Difficulty in walking, not elsewhere classified (R26.2);Unsteadiness on feet (R26.81);Other abnormalities of gait and mobility (R26.89) Pain - Right/Left:   (bilateral) Pain - part of body: Ankle and joints of foot     Time: 2952-8413 PT Time Calculation (min) (ACUTE ONLY): 32 min  Charges:    $Gait Training: 8-22 mins $Therapeutic Activity: 8-22 mins PT General Charges $$ ACUTE PT VISIT: 1 Visit                     Virgil Benedict, PT, DPT Acute Rehabilitation Services  Office: [phone]    Bettina Gavia 06/21/2023, 3:17 PM

## 2023-06-21 NOTE — Plan of Care (Signed)

## 2023-06-21 NOTE — Progress Notes (Signed)
Rounding Note    Patient Name: [name] Date of Encounter: 06/21/2023   [facility] Cardiologist: Charlton Haws, MD   Subjective   No CP; mild dyspnea  Inpatient Medications    Scheduled Meds:  apixaban  5 mg Oral BID   carbidopa-levodopa  1 tablet Oral TID   clonazePAM  1 mg Oral QHS   dapagliflozin propanediol  10 mg Oral Daily   furosemide  80 mg Intravenous BID   metoprolol succinate  12.5 mg Oral Daily   simvastatin  20 mg Oral QHS   sodium chloride flush  3 mL Intravenous Q12H   spironolactone  12.5 mg Oral Daily   Continuous Infusions:  PRN Meds: acetaminophen, ondansetron (ZOFRAN) IV, oxyCODONE-acetaminophen, senna-docusate, sodium chloride flush   Vital Signs    Vitals:   06/20/23 1911 06/21/23 0007 06/21/23 0342 06/21/23 0710  BP: (!) 111/57 113/67 (!) 122/59 116/65  Pulse: 74 77 77 78  Resp: 20 18 18 19   Temp: (!) 100.9 F (38.3 C) 98.6 F (37 C) 98.8 F (37.1 C) 98.5 F (36.9 C)  TempSrc: Oral Oral Oral Oral  SpO2: 96% 97% 100% 93%  Weight:   114.4 kg   Height:        Intake/Output Summary (Last 24 hours) at 06/21/2023 0951 Last data filed at 06/21/2023 0939 Gross per 24 hour  Intake 540 ml  Output 4175 ml  Net -3635 ml      06/21/2023    3:42 AM 06/20/2023    4:02 AM 06/19/2023    5:11 AM  Last 3 Weights  Weight (lbs) 252 lb 1.6 oz 255 lb 6.4 oz 262 lb 5.6 oz  Weight (kg) 114.352 kg 115.849 kg 119 kg      Telemetry    Sinus - Personally Reviewed   Physical Exam   GEN: No acute distress.   Neck: No JVD Cardiac: RRR, no murmurs, rubs, or gallops.  Respiratory: Clear to auscultation bilaterally. GI: Soft, nontender, non-distended  MS: 3+ edema Neuro:  Nonfocal  Psych: Normal affect   Labs    High Sensitivity Troponin:   Recent Labs  Lab 06/18/23 1213 06/18/23 1351 06/19/23 0840  TROPONINIHS 97* 88* 66*     Chemistry Recent Labs  Lab 06/18/23 1351 06/19/23 0517 06/20/23 0315 06/21/23 0500  NA   --  139 140 139  K  --  3.8 3.4* 3.9  CL  --  85* 83* 81*  CO2  --  43* >45* >45*  GLUCOSE  --  111* 108* 116*  BUN  --  43* 35* 33*  CREATININE  --  1.76* 1.58* 1.28*  CALCIUM  --  8.5* 8.4* 8.4*  MG  --   --  2.1  --   PROT 6.8  --   --   --   ALBUMIN 3.5  --   --   --   AST 26  --   --   --   ALT 15  --   --   --   ALKPHOS 81  --   --   --   BILITOT 1.1  --   --   --   GFRNONAA  --  41* 47* >60  ANIONGAP  --  11 NOT CALCULATED NOT CALCULATED     Hematology Recent Labs  Lab 06/18/23 1213 06/21/23 0500  WBC 7.6 7.4  RBC 3.23* 2.86*  HGB 9.2* 8.2*  HCT 31.3* 28.4*  MCV 96.9 99.3  MCH 28.5 28.7  MCHC 29.4* 28.9*  RDW 18.1* 18.6*  PLT 136* 126*    BNP Recent Labs  Lab 06/18/23 1213  BNP 2,104.7*      Radiology    DG CHEST PORT 1 VIEW  Result Date: 06/21/2023 CLINICAL DATA:  Community-acquired pneumonia EXAM: PORTABLE CHEST 1 VIEW COMPARISON:  06/18/2023 FINDINGS: Coronary artery bypass grafting has been performed. Stable cardiomegaly. The lungs are symmetrically well expanded. Central pulmonary vascular engorgement and superimposed mild bilateral interstitial pulmonary edema as well as small bilateral pleural effusions appears slightly progressive in keeping with changes of progressive cardiogenic failure. No pneumothorax. IMPRESSION: 1. Progressive cardiogenic failure.  Stable cardiomegaly. Electronically Signed   By: Helyn Numbers M.D.   On: 06/21/2023 00:20   ECHOCARDIOGRAM COMPLETE  Result Date: 06/19/2023    ECHOCARDIOGRAM REPORT   Patient Name:   [name] Date of Exam: 06/19/2023 Medical Rec #:  [id number]           Height:       72.0 in Accession #:    [id number]          Weight:       262.3 lb Date of Birth:  12-Sep-1955           BSA:          2.391 m Patient Age:    67 years            BP:           108/39 mmHg Patient Gender: M                   HR:           80 bpm. Exam Location:  Inpatient Procedure: 2D Echo, Color Doppler and Cardiac Doppler  Indications:    CHF  History:        Patient has prior history of Echocardiogram examinations, most                 recent 04/25/2022. CHF, Previous Myocardial Infarction and CAD,                 COPD and Carotid Disease; Risk Factors:Sleep Apnea, Obesity,                 Hypertension, Dyslipidemia and Diabetes. CKD.  Sonographer:    Milda Smart Referring Phys: [id number] RONDELL A Krienke IMPRESSIONS  1. Left ventricular ejection fraction, by estimation, is 55 to 60%. The left ventricle has normal function. The left ventricle has no regional wall motion abnormalities. There is moderate concentric left ventricular hypertrophy. Indeterminate diastolic filling due to E-A fusion. There is the interventricular septum is flattened in systole and diastole, consistent with right ventricular pressure and volume overload.  2. Right ventricular systolic function is moderately reduced. The right ventricular size is severely enlarged. There is severely elevated pulmonary artery systolic pressure. The estimated right ventricular systolic pressure is 68.6 mmHg.  3. Left atrial size was mildly dilated.  4. Right atrial size was mildly dilated.  5. The mitral valve is grossly normal. Mild mitral valve regurgitation. No evidence of mitral stenosis.  6. Tricuspid valve regurgitation is moderate to severe.  7. The aortic valve is tricuspid. Aortic valve regurgitation is not visualized. No aortic stenosis is present.  8. The inferior vena cava is dilated in size with <50% respiratory variability, suggesting right atrial pressure of 15 mmHg. Comparison(s): The right ventricular systolic function is worse. RVSP severely elevated. Conclusion(s)/Recommendation(s): Findings consistent with Cor Pulmonale. FINDINGS  Left Ventricle: Left ventricular ejection fraction, by estimation, is 55 to 60%. The left ventricle has normal function. The left ventricle has no regional wall motion abnormalities. The left ventricular internal cavity size was  normal in size. There is  moderate concentric left ventricular hypertrophy. The interventricular septum is flattened in systole and diastole, consistent with right ventricular pressure and volume overload. Indeterminate diastolic filling due to E-A fusion. Right Ventricle: The right ventricular size is severely enlarged. No increase in right ventricular wall thickness. Right ventricular systolic function is moderately reduced. There is severely elevated pulmonary artery systolic pressure. The tricuspid regurgitant velocity is 3.66 m/s, and with an assumed right atrial pressure of 15 mmHg, the estimated right ventricular systolic pressure is 68.6 mmHg. Left Atrium: Left atrial size was mildly dilated. Right Atrium: Right atrial size was mildly dilated. Pericardium: There is no evidence of pericardial effusion. Mitral Valve: The mitral valve is grossly normal. Mild mitral valve regurgitation. No evidence of mitral valve stenosis. MV peak gradient, 10.5 mmHg. The mean mitral valve gradient is 3.0 mmHg. Tricuspid Valve: The tricuspid valve is grossly normal. Tricuspid valve regurgitation is moderate to severe. No evidence of tricuspid stenosis. Aortic Valve: The aortic valve is tricuspid. Aortic valve regurgitation is not visualized. No aortic stenosis is present. Pulmonic Valve: The pulmonic valve was grossly normal. Pulmonic valve regurgitation is not visualized. No evidence of pulmonic stenosis. Aorta: The aortic root and ascending aorta are structurally normal, with no evidence of dilitation. Venous: The inferior vena cava is dilated in size with less than 50% respiratory variability, suggesting right atrial pressure of 15 mmHg. IAS/Shunts: The atrial septum is grossly normal.  LEFT VENTRICLE PLAX 2D LVIDd:         5.50 cm      Diastology LVIDs:         4.20 cm      LV e' medial:    4.35 cm/s LV PW:         1.38 cm      LV E/e' medial:  33.6 LV IVS:        1.40 cm      LV e' lateral:   12.40 cm/s LVOT diam:     2.50  cm      LV E/e' lateral: 11.8 LV SV:         102 LV SV Index:   43 LVOT Area:     4.91 cm  LV Volumes (MOD) LV vol d, MOD A2C: 143.0 ml LV vol d, MOD A4C: 139.0 ml LV vol s, MOD A2C: 58.5 ml LV vol s, MOD A4C: 62.2 ml LV SV MOD A2C:     84.5 ml LV SV MOD A4C:     139.0 ml LV SV MOD BP:      84.2 ml RIGHT VENTRICLE RV Basal diam:  5.50 cm RV Mid diam:    4.00 cm RV S prime:     8.38 cm/s TAPSE (M-mode): 1.8 cm LEFT ATRIUM             Index        RIGHT ATRIUM           Index LA diam:        5.60 cm 2.34 cm/m   RA Area:     27.40 cm LA Vol (A2C):   76.6 ml 32.04 ml/m  RA Volume:   91.60 ml  38.31 ml/m LA Vol (A4C):   73.2 ml 30.64 ml/m LA Biplane Vol: 87.2 ml  36.47 ml/m  AORTIC VALVE LVOT Vmax:   104.00 cm/s LVOT Vmean:  74.200 cm/s LVOT VTI:    0.208 m  AORTA Ao Root diam: 3.50 cm Ao Asc diam:  3.10 cm MITRAL VALVE                TRICUSPID VALVE MV Area (PHT): 3.81 cm     TR Peak grad:   53.6 mmHg MV Area VTI:   2.63 cm     TR Mean grad:   33.0 mmHg MV Peak grad:  10.5 mmHg    TR Vmax:        366.00 cm/s MV Mean grad:  3.0 mmHg     TR Vmean:       270.0 cm/s MV Vmax:       1.62 m/s MV Vmean:      85.8 cm/s    SHUNTS MV Decel Time: 199 msec     Systemic VTI:  0.21 m MR Peak grad: 64.6 mmHg     Systemic Diam: 2.50 cm MR Vmax:      402.00 cm/s MV E velocity: 146.00 cm/s Lennie Odor MD Electronically signed by Lennie Odor MD Signature Date/Time: 06/19/2023/4:22:24 PM    Final      Patient Profile     67 y.o. 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  Echocardiogram this admission shows normal LV  function, moderate left ventricular hypertrophy, flattened septum consistent with RV pressure/volume overload, moderate RV dysfunction, severe right ventricular enlargement, severe pulmonary hypertension, mild biatrial enlargement, mild mitral digitation, moderate to severe tricuspid regurgitation.  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  Patient remains markedly volume overloaded.  Will continue Lasix and spironolactone at present dose.  He is also developing a contraction alkalosis.  Add acetazolamide.  Continue to follow renal function closely.  Will also continue [place].  2 atrial flutter-patient remains in atrial flutter but heart rate is controlled.  Continue Toprol and apixaban.  Ultimately would benefit from cardioversion to see if he would maintain sinus rhythm.  This can be done 3 weeks after fully anticoagulated.  3 coronary artery disease-continue statin.  Aspirin and Plavix were discontinued given need for apixaban.  4 chronic stage III kidney disease-renal function improving with diuresis.  5 hyperlipidemia-continue statin.  6 severe pulmonary hypertension-likely secondary to COPD.  For questions or updates, please contact [facility] Please consult [email or web address] for contact info under        Signed, Olga Millers, MD  06/21/2023, 9:51 AM

## 2023-06-21 NOTE — Plan of Care (Signed)
  Problem: Education: Goal: Knowledge of General Education information will improve Description: Including pain rating scale, medication(s)/side effects and non-pharmacologic comfort measures Outcome: Progressing   Problem: Clinical Measurements: Goal: Ability to maintain clinical measurements within normal limits will improve Outcome: Progressing Goal: Respiratory complications will improve Outcome: Progressing Goal: Cardiovascular complication will be avoided Outcome: Progressing   Problem: Activity: Goal: Risk for activity intolerance will decrease Outcome: Progressing   Problem: Nutrition: Goal: Adequate nutrition will be maintained Outcome: Progressing   Problem: Coping: Goal: Level of anxiety will decrease Outcome: Progressing   Problem: Elimination: Goal: Will not experience complications related to urinary retention Outcome: Progressing   Problem: Pain Management: Goal: General experience of comfort will improve Outcome: Progressing   Problem: Safety: Goal: Ability to remain free from injury will improve Outcome: Progressing

## 2023-06-21 NOTE — Progress Notes (Signed)
Heart Failure Nurse Navigator Progress Note  PCP: Plotnikov, Georgina Quint, MD PCP-Cardiologist: Eden Emms Admission Diagnosis: Anasarca, Acute on chronic congestive heart failure.  Admitted from: wound care center  Presentation:   [name] presented with spo in the 80's on 2 L of O2 at his wound care center.Pitting edema to the abdomen as well as bilateral lower extremities up to his groin.  BP 149/125, HR 72, BNP 2,104.7,   Patient and wife were educated on the sign and symptoms of heart failure, daily weights, when ot call his doctor or go to the ED, Diet/ fluid restrictions ( during interview patient wife had brought him a chicken biscuit from [employer], was eating it when entered room. ) education on salt restrictions done, as well as taking all medications as prescribed, reported that he doesn't always take his diuretics, especially when they are out doing errands or have something else to do, because of how many bathroom trips he needs then. Education on attending all medical appointments, patient and wife verbalized their understanding, a HF TOC appointment was scheduled for 07/05/2023 @ 2 pm.   ECHO/ LVEF: 55-60%  Clinical Course:  Past Medical History:  Diagnosis Date   Anxiety    Basal cell carcinoma of nose    Blood dyscrasia    trouble clotting    Carotid artery disease (HCC)    a. s/p L CEA 2009 (hx of evacuation of hematoma due to heparin); b. 01/2022 Carotid U/S: 1-39% bilat stenosis.   Chronic heart failure with preserved ejection fraction (HFpEF) (HCC)    a. 04/2022 Echo: EF 60-65%, no rwma, GrII DD, nl RV fxn, RVSP 47.12mmHg. Mild BAE. Mild MR. Mild-mod TR.   Chronic lower back pain    CKD (chronic kidney disease), stage III (HCC)    COPD (chronic obstructive pulmonary disease) (HCC)    Coronary artery disease    a. 2005 s/p CABG x 3 (LIMA->LAD, VG->OM1, VG->dRCA); b. 09/2011 s/p PCI/DES of the VG->OM1 and VG->dRCA; c. 11/2017 PCI: LM 50ost, LAD 90ost/p, OM1 100  CTO, RCA 100p CTO, VG->dRCA 99 ISR (4.0x22 Resolute Onyx DES), VG->OM1 100 CTO/ISR, LIMA->LAD patent.   Depression    Diabetes mellitus without complication (HCC)    borderline    Dysrhythmia    fluttering   Edema    GERD (gastroesophageal reflux disease)    Heart murmur    Heparin induced thrombocytopenia (HCC)    History of home oxygen therapy    2 liters at night prn   Hyperlipidemia    Hypertension    Lymphoma (HCC) 12/21/2014   Myocardial infarction (HCC)    Normocytic anemia    Obesity    Sleep apnea    a. Not using CPAP.     Social History   Socioeconomic History   Marital status: Married    Spouse name: Talbert Forest   Number of children: 1   Years of education: Not on file   Highest education level: Not on file  Occupational History   Occupation: Event organiser    Comment: Retired  Tobacco Use   Smoking status: Former    Current packs/day: 0.00    Average packs/day: 2.0 packs/day for 41.0 years (82.0 ttl pk-yrs)    Types: Cigarettes    Start date: 11/23/1976    Quit date: 11/23/2017    Years since quitting: 5.5   Smokeless tobacco: Former    Types: Chew   Tobacco comments:    consult entered  Building services engineer  status: Never Used  Substance and Sexual Activity   Alcohol use: No    Alcohol/week: 0.0 standard drinks of alcohol    Comment: 09/29/11 "quit years ago"   Drug use: Not Currently    Types: Marijuana    Comment: as of 07/21/22: occasional pipe last use 07/20/22   Sexual activity: Not Currently  Other Topics Concern   Not on file  Social History Narrative   Not on file   Social Determinants of Health   Financial Resource Strain: Low Risk  (03/05/2023)   Overall Financial Resource Strain (CARDIA)    Difficulty of Paying Living Expenses: Not very hard  Food Insecurity: No Food Insecurity (06/18/2023)   Hunger Vital Sign    Worried About Running Out of Food in the Last Year: Never true    Ran Out of Food in the Last Year: Never true   Transportation Needs: No Transportation Needs (06/18/2023)   PRAPARE - Administrator, Civil Service (Medical): No    Lack of Transportation (Non-Medical): No  Physical Activity: Inactive (06/11/2023)   Exercise Vital Sign    Days of Exercise per Week: 0 days    Minutes of Exercise per Session: 0 min  Stress: Stress Concern Present (06/11/2023)   [employer] of Occupational Health - Occupational Stress Questionnaire    Feeling of Stress : To some extent  Social Connections: Moderately Integrated (03/05/2023)   Social Connection and Isolation Panel [NHANES]    Frequency of Communication with Friends and Family: Once a week    Frequency of Social Gatherings with Friends and Family: Once a week    Attends Religious Services: More than 4 times per year    Active Member of [employer] or Organizations: Yes    Attends Banker Meetings: Never    Marital Status: Married   Water engineer and Provision:  Detailed education and instructions provided on heart failure disease management including the following:  Signs and symptoms of Heart Failure When to call the physician Importance of daily weights Low sodium diet Fluid restriction Medication management Anticipated future follow-up appointments  Patient education given on each of the above topics.  Patient acknowledges understanding via teach back method and acceptance of all instructions.  Education Materials:  "Living Better With Heart Failure" Booklet, HF zone tool, & Daily Weight Tracker Tool.  Patient has scale at home: yes Patient has pill box at home: yes    High Risk Criteria for Readmission and/or Poor Patient Outcomes: Heart failure hospital admissions (last 6 months): 1  No Show rate: 3% Difficult social situation: No, lives with his wife Demonstrates medication adherence: No, noncompliant with his torsemide Primary Language: English Literacy level: Reading, writing,and  comprehension  Barriers of Care:   Medication non-compliance ( torsemide)  Diet/ fluid restrictions Daily weights   Considerations/Referrals:   Referral made to Heart Failure Pharmacist Stewardship: Yes Referral made to Heart Failure CSW/NCM TOC: No Referral made to Heart & Vascular TOC clinic: Yes, 07/05/2023 @ 2 pm per Dr. Jomarie Longs  Items for Follow-up on DC/TOC: Medication compliance Diet/ fluid restrictions Daily weights Continued HF education   Rhae Hammock, BSN, RN Heart Failure Print production planner Chat Only

## 2023-06-21 NOTE — Progress Notes (Addendum)
PROGRESS NOTE    [name]  VZD:[id number] DOB: 1956-08-11 DOA: 06/18/2023 PCP: Tresa Garter, MD  67/M w hypertension, diastolic CHF, CAD, COPD, follicular lymphoma presented to the ED with worsening swelling of lower extremities extending to abdomen, developed blisters as well  -Patient takes torsemide 3 times a week instead of daily on account of frequent urination -Recent falls, dizziness -In the ED tachypneic O2 sats 80%, hemoglobin 9.2, creatinine 1.9, BNP 09/14/2002, troponin 97, 88   Subjective: Continues to have tremors, breathing improving, still swollen   Assessment and Plan:  Acute on chronic diastolic CHF (congestive heart failure) (HCC) RV failure, severe pulmonary hypertension Acute hypoxic respiratory failure - last echo 9/23 noted EF 60 to 65%, mild LVH, RV preserved, RV  -Repeat echo now with EF 55-60%, moderate LVH, RV overload, moderately reduced RV function, severely elevated PASP -Significantly volume overloaded on admission, now diuresing well he is 10.5 L negative -cards following, continue IV Lasix, Farxiga, Aldactone -Severe pulmonary hypertension likely related to underlying COPD, untreated sleep apnea and current overloaded state -Wean O2, out of bed, PT OT eval> home health services recommended  Leg wounds, fluid blisters -Secondary to above, continue wound care, followed at the wound center weekly, last changed on Monday, needs dressing change weekly  Worsening anemia -Likely multifactorial, has lymphoma as well -Check anemia panel with a.m. labs  Suspected sleep apnea -Likely contributing to his RV failure, diagnosed many years ago could not tolerate CPAP -Retrial CPAP as inpatient  Atrial flutter (HCC) -Started on metoprolol and apixaban CHA2DS2 vasc score is elevated at 5.  -Cards following, possible cardioversion when volume status has improved  Tremors, ataxia -Per spouse, symptoms ongoing for at least 6 months, high suspicion  of Parkinson's or parkinsonian syndrome, has pill-rolling tremor and shuffling gait, trial of low-dose Sinemet -Will need neurology follow-up after discharge -PT OT eval  Acute kidney injury superimposed on chronic kidney disease (HCC) CKD stage 3a -Cardiorenal, improving with diuresis  Essential hypertension -Improving, meds as above  CAD S/P percutaneous coronary angioplasty Elevated troponin -Suspect demand ischemia in the setting of above, continue apixaban, statin  COPD mixed type (HCC) No clinical signs of exacerbation Continue bronchodilator therapy.   History of follicular lymphoma Follow up as outpatient.   Open wound of lower extremity without complication, subsequent encounter Continue wound care.   Thrombocytopenia (HCC) Stable follow up cell count as outpatient.   Obesity (BMI 30-39.9) Calculated BMI is 35.5 consistent with obesity class 2.   Falls PT and OT  DVT prophylaxis: apixaban Code Status: Full code Family Communication: Wife at bedside Disposition Plan: May need rehab  Consultants:    Procedures:   Antimicrobials:    Objective: Vitals:   06/21/23 0007 06/21/23 0342 06/21/23 0710 06/21/23 1104  BP: 113/67 (!) 122/59 116/65 109/64  Pulse: 77 77 78 79  Resp: 18 18 19 19   Temp: 98.6 F (37 C) 98.8 F (37.1 C) 98.5 F (36.9 C) 98.7 F (37.1 C)  TempSrc: Oral Oral Oral Oral  SpO2: 97% 100% 93% 97%  Weight:  114.4 kg    Height:        Intake/Output Summary (Last 24 hours) at 06/21/2023 1200 Last data filed at 06/21/2023 0939 Gross per 24 hour  Intake 540 ml  Output 3425 ml  Net -2885 ml   Filed Weights   06/19/23 0511 06/20/23 0402 06/21/23 0342  Weight: 119 kg 115.8 kg 114.4 kg    Examination:  Obese chronically ill male sitting  up, AAOx3 HEENT: Positive JVD CVS: S1-S2, regular rhythm Lungs: Decreased breath sounds to bases, rare expiratory wheezes Abdomen: Soft, obese, distended with edema Extremities: 2+ edema with  dressing  Neuro: Tremors, slightly increased muscle tone   Data Reviewed:   CBC: Recent Labs  Lab 06/18/23 1213 06/21/23 0500  WBC 7.6 7.4  NEUTROABS  --  4.9  HGB 9.2* 8.2*  HCT 31.3* 28.4*  MCV 96.9 99.3  PLT 136* 126*   Basic Metabolic Panel: Recent Labs  Lab 06/18/23 1213 06/19/23 0517 06/20/23 0315 06/21/23 0500  NA 137 139 140 139  K 4.2 3.8 3.4* 3.9  CL 84* 85* 83* 81*  CO2 40* 43* >45* >45*  GLUCOSE 91 111* 108* 116*  BUN 42* 43* 35* 33*  CREATININE 1.96* 1.76* 1.58* 1.28*  CALCIUM 8.8* 8.5* 8.4* 8.4*  MG  --   --  2.1  --    GFR: Estimated Creatinine Clearance: 71.1 mL/min (A) (by C-G formula based on SCr of 1.28 mg/dL (H)). Liver Function Tests: Recent Labs  Lab 06/18/23 1351  AST 26  ALT 15  ALKPHOS 81  BILITOT 1.1  PROT 6.8  ALBUMIN 3.5   No results for input(s): "LIPASE", "AMYLASE" in the last 168 hours. No results for input(s): "AMMONIA" in the last 168 hours. Coagulation Profile: No results for input(s): "INR", "PROTIME" in the last 168 hours. Cardiac Enzymes: No results for input(s): "CKTOTAL", "CKMB", "CKMBINDEX", "TROPONINI" in the last 168 hours. BNP (last 3 results) No results for input(s): "PROBNP" in the last 8760 hours. HbA1C: No results for input(s): "HGBA1C" in the last 72 hours. CBG: No results for input(s): "GLUCAP" in the last 168 hours. Lipid Profile: No results for input(s): "CHOL", "HDL", "LDLCALC", "TRIG", "CHOLHDL", "LDLDIRECT" in the last 72 hours. Thyroid Function Tests: No results for input(s): "TSH", "T4TOTAL", "FREET4", "T3FREE", "THYROIDAB" in the last 72 hours. Anemia Panel: No results for input(s): "VITAMINB12", "FOLATE", "FERRITIN", "TIBC", "IRON", "RETICCTPCT" in the last 72 hours. Urine analysis:    Component Value Date/Time   COLORURINE YELLOW 06/21/2023 0024   APPEARANCEUR CLEAR 06/21/2023 0024   LABSPEC 1.010 06/21/2023 0024   PHURINE 6.0 06/21/2023 0024   GLUCOSEU >=500 (A) 06/21/2023 0024    GLUCOSEU NEGATIVE 02/21/2023 1201   HGBUR LARGE (A) 06/21/2023 0024   BILIRUBINUR NEGATIVE 06/21/2023 0024   KETONESUR NEGATIVE 06/21/2023 0024   PROTEINUR NEGATIVE 06/21/2023 0024   UROBILINOGEN 1.0 02/21/2023 1201   NITRITE NEGATIVE 06/21/2023 0024   LEUKOCYTESUR NEGATIVE 06/21/2023 0024   Sepsis Labs: @LABRCNTIP (procalcitonin:4,lacticidven:4)  ) Recent Results (from the past 240 hour(s))  Resp panel by RT-PCR (RSV, Flu A&B, Covid) Anterior Nasal Swab     Status: None   Collection Time: 06/20/23 11:07 PM   Specimen: Anterior Nasal Swab  Result Value Ref Range Status   SARS Coronavirus 2 by RT PCR NEGATIVE NEGATIVE Final   Influenza A by PCR NEGATIVE NEGATIVE Final   Influenza B by PCR NEGATIVE NEGATIVE Final    Comment: (NOTE) The Xpert Xpress SARS-CoV-2/FLU/RSV plus assay is intended as an aid in the diagnosis of influenza from Nasopharyngeal swab specimens and should not be used as a sole basis for treatment. Nasal washings and aspirates are unacceptable for Xpert Xpress SARS-CoV-2/FLU/RSV testing.  Fact Sheet for Patients: BloggerCourse.com  Fact Sheet for Healthcare Providers: SeriousBroker.it  This test is not yet approved or cleared by the [place] FDA and has been authorized for detection and/or diagnosis of SARS-CoV-2 by FDA under an Emergency Use Authorization (EUA).  This EUA will remain in effect (meaning this test can be used) for the duration of the COVID-19 declaration under Section 564(b)(1) of the Act, 21 U.S.C. section 360bbb-3(b)(1), unless the authorization is terminated or revoked.     Resp Syncytial Virus by PCR NEGATIVE NEGATIVE Final    Comment: (NOTE) Fact Sheet for Patients: BloggerCourse.com  Fact Sheet for Healthcare Providers: SeriousBroker.it  This test is not yet approved or cleared by the [place] FDA and has been authorized  for detection and/or diagnosis of SARS-CoV-2 by FDA under an Emergency Use Authorization (EUA). This EUA will remain in effect (meaning this test can be used) for the duration of the COVID-19 declaration under Section 564(b)(1) of the Act, 21 U.S.C. section 360bbb-3(b)(1), unless the authorization is terminated or revoked.  Performed at [facility], [address], [place], [place] [address]      Radiology Studies: DG CHEST PORT 1 VIEW  Result Date: 06/21/2023 CLINICAL DATA:  Community-acquired pneumonia EXAM: PORTABLE CHEST 1 VIEW COMPARISON:  06/18/2023 FINDINGS: Coronary artery bypass grafting has been performed. Stable cardiomegaly. The lungs are symmetrically well expanded. Central pulmonary vascular engorgement and superimposed mild bilateral interstitial pulmonary edema as well as small bilateral pleural effusions appears slightly progressive in keeping with changes of progressive cardiogenic failure. No pneumothorax. IMPRESSION: 1. Progressive cardiogenic failure.  Stable cardiomegaly. Electronically Signed   By: Helyn Numbers M.D.   On: 06/21/2023 00:20   ECHOCARDIOGRAM COMPLETE  Result Date: 06/19/2023    ECHOCARDIOGRAM REPORT   Patient Name:   [name] Date of Exam: 06/19/2023 Medical Rec #:  [id number]           Height:       72.0 in Accession #:    [id number]          Weight:       262.3 lb Date of Birth:  October 01, 1955           BSA:          2.391 m Patient Age:    67 years            BP:           108/39 mmHg Patient Gender: M                   HR:           80 bpm. Exam Location:  Inpatient Procedure: 2D Echo, Color Doppler and Cardiac Doppler Indications:    CHF  History:        Patient has prior history of Echocardiogram examinations, most                 recent 04/25/2022. CHF, Previous Myocardial Infarction and CAD,                 COPD and Carotid Disease; Risk Factors:Sleep Apnea, Obesity,                 Hypertension, Dyslipidemia and Diabetes. CKD.   Sonographer:    Milda Smart Referring Phys: [id number] RONDELL A Breeden IMPRESSIONS  1. Left ventricular ejection fraction, by estimation, is 55 to 60%. The left ventricle has normal function. The left ventricle has no regional wall motion abnormalities. There is moderate concentric left ventricular hypertrophy. Indeterminate diastolic filling due to E-A fusion. There is the interventricular septum is flattened in systole and diastole, consistent with right ventricular pressure and volume overload.  2. Right ventricular systolic function is moderately reduced. The  right ventricular size is severely enlarged. There is severely elevated pulmonary artery systolic pressure. The estimated right ventricular systolic pressure is 68.6 mmHg.  3. Left atrial size was mildly dilated.  4. Right atrial size was mildly dilated.  5. The mitral valve is grossly normal. Mild mitral valve regurgitation. No evidence of mitral stenosis.  6. Tricuspid valve regurgitation is moderate to severe.  7. The aortic valve is tricuspid. Aortic valve regurgitation is not visualized. No aortic stenosis is present.  8. The inferior vena cava is dilated in size with <50% respiratory variability, suggesting right atrial pressure of 15 mmHg. Comparison(s): The right ventricular systolic function is worse. RVSP severely elevated. Conclusion(s)/Recommendation(s): Findings consistent with Cor Pulmonale. FINDINGS  Left Ventricle: Left ventricular ejection fraction, by estimation, is 55 to 60%. The left ventricle has normal function. The left ventricle has no regional wall motion abnormalities. The left ventricular internal cavity size was normal in size. There is  moderate concentric left ventricular hypertrophy. The interventricular septum is flattened in systole and diastole, consistent with right ventricular pressure and volume overload. Indeterminate diastolic filling due to E-A fusion. Right Ventricle: The right ventricular size is severely enlarged.  No increase in right ventricular wall thickness. Right ventricular systolic function is moderately reduced. There is severely elevated pulmonary artery systolic pressure. The tricuspid regurgitant velocity is 3.66 m/s, and with an assumed right atrial pressure of 15 mmHg, the estimated right ventricular systolic pressure is 68.6 mmHg. Left Atrium: Left atrial size was mildly dilated. Right Atrium: Right atrial size was mildly dilated. Pericardium: There is no evidence of pericardial effusion. Mitral Valve: The mitral valve is grossly normal. Mild mitral valve regurgitation. No evidence of mitral valve stenosis. MV peak gradient, 10.5 mmHg. The mean mitral valve gradient is 3.0 mmHg. Tricuspid Valve: The tricuspid valve is grossly normal. Tricuspid valve regurgitation is moderate to severe. No evidence of tricuspid stenosis. Aortic Valve: The aortic valve is tricuspid. Aortic valve regurgitation is not visualized. No aortic stenosis is present. Pulmonic Valve: The pulmonic valve was grossly normal. Pulmonic valve regurgitation is not visualized. No evidence of pulmonic stenosis. Aorta: The aortic root and ascending aorta are structurally normal, with no evidence of dilitation. Venous: The inferior vena cava is dilated in size with less than 50% respiratory variability, suggesting right atrial pressure of 15 mmHg. IAS/Shunts: The atrial septum is grossly normal.  LEFT VENTRICLE PLAX 2D LVIDd:         5.50 cm      Diastology LVIDs:         4.20 cm      LV e' medial:    4.35 cm/s LV PW:         1.38 cm      LV E/e' medial:  33.6 LV IVS:        1.40 cm      LV e' lateral:   12.40 cm/s LVOT diam:     2.50 cm      LV E/e' lateral: 11.8 LV SV:         102 LV SV Index:   43 LVOT Area:     4.91 cm  LV Volumes (MOD) LV vol d, MOD A2C: 143.0 ml LV vol d, MOD A4C: 139.0 ml LV vol s, MOD A2C: 58.5 ml LV vol s, MOD A4C: 62.2 ml LV SV MOD A2C:     84.5 ml LV SV MOD A4C:     139.0 ml LV SV MOD BP:  84.2 ml RIGHT VENTRICLE RV  Basal diam:  5.50 cm RV Mid diam:    4.00 cm RV S prime:     8.38 cm/s TAPSE (M-mode): 1.8 cm LEFT ATRIUM             Index        RIGHT ATRIUM           Index LA diam:        5.60 cm 2.34 cm/m   RA Area:     27.40 cm LA Vol (A2C):   76.6 ml 32.04 ml/m  RA Volume:   91.60 ml  38.31 ml/m LA Vol (A4C):   73.2 ml 30.64 ml/m LA Biplane Vol: 87.2 ml 36.47 ml/m  AORTIC VALVE LVOT Vmax:   104.00 cm/s LVOT Vmean:  74.200 cm/s LVOT VTI:    0.208 m  AORTA Ao Root diam: 3.50 cm Ao Asc diam:  3.10 cm MITRAL VALVE                TRICUSPID VALVE MV Area (PHT): 3.81 cm     TR Peak grad:   53.6 mmHg MV Area VTI:   2.63 cm     TR Mean grad:   33.0 mmHg MV Peak grad:  10.5 mmHg    TR Vmax:        366.00 cm/s MV Mean grad:  3.0 mmHg     TR Vmean:       270.0 cm/s MV Vmax:       1.62 m/s MV Vmean:      85.8 cm/s    SHUNTS MV Decel Time: 199 msec     Systemic VTI:  0.21 m MR Peak grad: 64.6 mmHg     Systemic Diam: 2.50 cm MR Vmax:      402.00 cm/s MV E velocity: 146.00 cm/s Lennie Odor MD Electronically signed by Lennie Odor MD Signature Date/Time: 06/19/2023/4:22:24 PM    Final      Scheduled Meds:  acetaZOLAMIDE  250 mg Oral BID   apixaban  5 mg Oral BID   carbidopa-levodopa  1 tablet Oral TID   clonazePAM  1 mg Oral QHS   dapagliflozin propanediol  10 mg Oral Daily   furosemide  80 mg Intravenous BID   metoprolol succinate  12.5 mg Oral Daily   simvastatin  20 mg Oral QHS   sodium chloride flush  3 mL Intravenous Q12H   spironolactone  12.5 mg Oral Daily   Continuous Infusions:     LOS: 3 days    Time spent:    Zannie Cove, MD [facility]   06/21/2023, 12:00 PM

## 2023-06-21 NOTE — Telephone Encounter (Signed)
 Left patient a vm regarding upcoming appointment

## 2023-06-22 ENCOUNTER — Ambulatory Visit: Payer: Medicare Other | Admitting: Primary Care

## 2023-06-22 DIAGNOSIS — I5033 Acute on chronic diastolic (congestive) heart failure: Secondary | ICD-10-CM | POA: Diagnosis not present

## 2023-06-22 LAB — RETICULOCYTES
Immature Retic Fract: 22 % — ABNORMAL HIGH (ref 2.3–15.9)
RBC.: 2.66 MIL/uL — ABNORMAL LOW (ref 4.22–5.81)
Retic Count, Absolute: 64.9 10*3/uL (ref 19.0–186.0)
Retic Ct Pct: 2.4 % (ref 0.4–3.1)

## 2023-06-22 LAB — IRON AND TIBC
Iron: 23 ug/dL — ABNORMAL LOW (ref 45–182)
Saturation Ratios: 6 % — ABNORMAL LOW (ref 17.9–39.5)
TIBC: 419 ug/dL (ref 250–450)
UIBC: 396 ug/dL

## 2023-06-22 LAB — BASIC METABOLIC PANEL
BUN: 33 mg/dL — ABNORMAL HIGH (ref 8–23)
CO2: >45 mmol/L — ABNORMAL HIGH (ref 22–32)
Calcium: 8.7 mg/dL — ABNORMAL LOW (ref 8.9–10.3)
Chloride: 82 mmol/L — ABNORMAL LOW (ref 98–111)
Creatinine, Ser: 1.38 mg/dL — ABNORMAL HIGH (ref 0.61–1.24)
GFR, Estimated: 55 mL/min — ABNORMAL LOW (ref >60)
Glucose, Bld: 122 mg/dL — ABNORMAL HIGH (ref 70–99)
Potassium: 3.7 mmol/L (ref 3.5–5.1)
Sodium: 140 mmol/L (ref 135–145)

## 2023-06-22 LAB — FOLATE: Folate: 10.5 ng/mL (ref >5.9)

## 2023-06-22 LAB — VITAMIN B12: Vitamin B-12: 562 pg/mL (ref 180–914)

## 2023-06-22 LAB — FERRITIN: Ferritin: 27 ng/mL (ref 24–336)

## 2023-06-22 MED ORDER — SODIUM CHLORIDE 0.9 % IV SOLN
300.0000 mg | Freq: Once | INTRAVENOUS | Status: AC
  Administered 2023-06-22: 300 mg via INTRAVENOUS
  Filled 2023-06-22: qty 15

## 2023-06-22 MED ORDER — POTASSIUM CHLORIDE CRYS ER 20 MEQ PO TBCR
40.0000 meq | EXTENDED_RELEASE_TABLET | Freq: Once | ORAL | Status: AC
  Administered 2023-06-22: 40 meq via ORAL
  Filled 2023-06-22: qty 2

## 2023-06-22 MED ORDER — SPIRONOLACTONE 25 MG PO TABS
25.0000 mg | ORAL_TABLET | Freq: Every day | ORAL | Status: DC
  Administered 2023-06-23: 25 mg via ORAL
  Filled 2023-06-22: qty 1

## 2023-06-22 NOTE — Progress Notes (Signed)
Mobility Specialist Progress Note:    06/22/23 1431  Mobility  Activity Transferred from chair to bed  Level of Assistance Minimal assist, patient does 75% or more  Assistive Device Front wheel walker  Distance Ambulated (ft) 5 ft  Activity Response Tolerated well  Mobility Referral Yes  $Mobility charge 1 Mobility  Mobility Specialist Start Time (ACUTE ONLY) 1345  Mobility Specialist Stop Time (ACUTE ONLY) 1400  Mobility Specialist Time Calculation (min) (ACUTE ONLY) 15 min   Received pt in chair requesting to get back to bed. Needed x2 attempts to stand w/ MinA. No c/o throughout situated in bed w/ call bell and personal belonging in reach. All needs met.  Thompson Grayer Mobility Specialist  Please contact vis Secure Chat or  Rehab Office [phone]

## 2023-06-22 NOTE — Progress Notes (Signed)
   06/22/23 2008  BiPAP/CPAP/SIPAP  Reason BIPAP/CPAP not in use Non-compliant  BiPAP/CPAP /SiPAP Vitals  Pulse Rate 81  Resp 18  SpO2 (!) 89 %  MEWS Score/Color  MEWS Score 0  MEWS Score Color [place] Si

## 2023-06-22 NOTE — Plan of Care (Signed)

## 2023-06-22 NOTE — Progress Notes (Signed)
Rounding Note    Patient Name: [name] Date of Encounter: 06/22/2023  [facility] Cardiologist: Charlton Haws, MD   Subjective   No CP; dyspnea unchanged  Inpatient Medications    Scheduled Meds:  acetaZOLAMIDE  250 mg Oral BID   apixaban  5 mg Oral BID   carbidopa-levodopa  1 tablet Oral TID   clonazePAM  1 mg Oral QHS   dapagliflozin propanediol  10 mg Oral Daily   furosemide  80 mg Intravenous BID   metoprolol succinate  12.5 mg Oral Daily   potassium chloride  40 mEq Oral Once   simvastatin  20 mg Oral QHS   sodium chloride flush  3 mL Intravenous Q12H   spironolactone  12.5 mg Oral Daily   Continuous Infusions:  iron sucrose     PRN Meds: acetaminophen, ondansetron (ZOFRAN) IV, oxyCODONE-acetaminophen, senna-docusate, sodium chloride flush   Vital Signs    Vitals:   06/21/23 1942 06/22/23 0016 06/22/23 0349 06/22/23 0725  BP: (!) 106/53 115/60 109/61 119/84  Pulse: 82 82  79  Resp: 16 20  17   Temp: 97.7 F (36.5 C) 99.1 F (37.3 C) 98.7 F (37.1 C) 97.7 F (36.5 C)  TempSrc: Oral Oral Oral Oral  SpO2: 100% 100%  100%  Weight:      Height:        Intake/Output Summary (Last 24 hours) at 06/22/2023 0925 Last data filed at 06/22/2023 0830 Gross per 24 hour  Intake 780 ml  Output 3925 ml  Net -3145 ml      06/21/2023    3:42 AM 06/20/2023    4:02 AM 06/19/2023    5:11 AM  Last 3 Weights  Weight (lbs) 252 lb 1.6 oz 255 lb 6.4 oz 262 lb 5.6 oz  Weight (kg) 114.352 kg 115.849 kg 119 kg      Telemetry    Sinus - Personally Reviewed   Physical Exam   GEN: NAD Neck: supple Cardiac: RRR Respiratory: CTA GI: Soft, NT/ND; abdominal wall edema MS: 3+ edema Neuro:  Grossly intact Psych: Normal affect   Labs    High Sensitivity Troponin:   Recent Labs  Lab 06/18/23 1213 06/18/23 1351 06/19/23 0840  TROPONINIHS 97* 88* 66*     Chemistry Recent Labs  Lab 06/18/23 1351 06/19/23 0517 06/20/23 0315 06/21/23 0500  06/22/23 0330  NA  --    < > 140 139 140  K  --    < > 3.4* 3.9 3.7  CL  --    < > 83* 81* 82*  CO2  --    < > >45* >45* >45*  GLUCOSE  --    < > 108* 116* 122*  BUN  --    < > 35* 33* 33*  CREATININE  --    < > 1.58* 1.28* 1.38*  CALCIUM  --    < > 8.4* 8.4* 8.7*  MG  --   --  2.1  --   --   PROT 6.8  --   --   --   --   ALBUMIN 3.5  --   --   --   --   AST 26  --   --   --   --   ALT 15  --   --   --   --   ALKPHOS 81  --   --   --   --   BILITOT 1.1  --   --   --   --  GFRNONAA  --    < > 47* >60 55*  ANIONGAP  --    < > NOT CALCULATED NOT CALCULATED NOT CALCULATED   < > = values in this interval not displayed.     Hematology Recent Labs  Lab 06/18/23 1213 06/21/23 0500 06/22/23 0332  WBC 7.6 7.4  --   RBC 3.23* 2.86* 2.66*  HGB 9.2* 8.2*  --   HCT 31.3* 28.4*  --   MCV 96.9 99.3  --   MCH 28.5 28.7  --   MCHC 29.4* 28.9*  --   RDW 18.1* 18.6*  --   PLT 136* 126*  --     BNP Recent Labs  Lab 06/18/23 1213  BNP 2,104.7*      Radiology    DG CHEST PORT 1 VIEW  Result Date: 06/21/2023 CLINICAL DATA:  Community-acquired pneumonia EXAM: PORTABLE CHEST 1 VIEW COMPARISON:  06/18/2023 FINDINGS: Coronary artery bypass grafting has been performed. Stable cardiomegaly. The lungs are symmetrically well expanded. Central pulmonary vascular engorgement and superimposed mild bilateral interstitial pulmonary edema as well as small bilateral pleural effusions appears slightly progressive in keeping with changes of progressive cardiogenic failure. No pneumothorax. IMPRESSION: 1. Progressive cardiogenic failure.  Stable cardiomegaly. Electronically Signed   By: Helyn Numbers M.D.   On: 06/21/2023 00:20     Patient Profile     67 y.o. 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  Echocardiogram this admission shows normal LV function, moderate left ventricular hypertrophy, flattened septum consistent with RV pressure/volume overload, moderate RV dysfunction, severe right ventricular enlargement, severe pulmonary hypertension, mild biatrial enlargement, mild mitral digitation, moderate to severe tricuspid regurgitation.  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  Patient remains volume overloaded.  Continue Lasix, spironolactone and acetazolamide.  Continue to follow renal function closely.  Will also continue [place].    2 atrial flutter-patient remains in atrial flutter but heart rate is controlled.  Continue Toprol and apixaban.  Ultimately would benefit from cardioversion to see if he would maintain sinus rhythm.  This can be done 3 weeks after fully anticoagulated.  3 coronary artery disease-continue statin.  Aspirin and Plavix were discontinued given need for apixaban.  4 chronic stage III kidney disease-renal function essentially unchanged today.  Will continue to monitor.  5 hyperlipidemia-continue statin.  6 severe pulmonary hypertension-likely secondary to COPD.  For questions or updates, please contact [facility] Please consult [email or web address] for contact info under        Signed, Olga Millers, MD  06/22/2023, 9:25 AM

## 2023-06-22 NOTE — Progress Notes (Addendum)
Physical Therapy Treatment Patient Details Name: [name] MRN: [id number] DOB: 22-Dec-1955 Today's Date: 06/22/2023   History of Present Illness Pt is 67 yo male who presents on 06/18/23 with acute on chronic congestive heart failure with progressively worsening swelling over several weeks. PMH: CAD, CABG and PCI, COPD, OSA, CHF, HTN, GERD, CKD3, follicular lymphoma    PT Comments  Attempted to improve pt's BP by utilizing an abdominal binder with upright mobility this date. It did not appear to help much, see below. Also, attempted to raise his BP through seated lower extremity AROM, which also did not seem to make much of a difference. Pt was able to stand and ambulate up to ~92 ft again today, but continues to need cues to improve his posture. Will continue to follow acutely.  BP-  114/55 (74) supine 85/55 (66) sitting 82/51 (62) sitting with abdominal binder donned and after performing AROM of legs 85/54 (62) standing with abdominal binder donned 132/107 (114) while ambulating with abdominal binder donned (unsure of accuracy) 86/46 (60) sitting right after ambulating with abdominal binder donned 93/59 sitting several minutes end of session with abdominal binder donned  *pt reported mild lightheadedness with changing positions     If plan is discharge home, recommend the following: A little help with walking and/or transfers;A little help with bathing/dressing/bathroom;Assist for transportation;Assistance with cooking/housework;Help with stairs or ramp for entrance   Can travel by private vehicle        Equipment Recommendations  None recommended by PT    Recommendations for Other Services       Precautions / Restrictions Precautions Precautions: Fall Precaution Comments: watch BP Restrictions Weight Bearing Restrictions: No Other Position/Activity Restrictions: B LE wounds, has unna boots     Mobility  Bed Mobility Overal bed mobility: Needs Assistance Bed  Mobility: Supine to Sit     Supine to sit: Min assist, HOB elevated     General bed mobility comments: Cues provided to manage each leg off EOB and minA to ascend trunk to sit up    Transfers Overall transfer level: Needs assistance Equipment used: Rollator (4 wheels) Transfers: Sit to/from Stand Sit to Stand: Contact guard assist           General transfer comment: Good carryover noted from previous session in regards to brake application on rollator prior to standing and on hand placement. CGA for safety    Ambulation/Gait Ambulation/Gait assistance: Contact guard assist Gait Distance (Feet): 92 Feet Assistive device: Rollator (4 wheels) Gait Pattern/deviations: Step-through pattern, Wide base of support, Decreased stride length, Trunk flexed Gait velocity: decreased Gait velocity interpretation: <1.31 ft/sec, indicative of household ambulator   General Gait Details: Pt ambulated slowly but steadily. Verbal and tactile cues provided for improved upright posture with momentary success noted. CGA for safety   Stairs             Wheelchair Mobility     Tilt Bed    Modified Rankin (Stroke Patients Only)       Balance Overall balance assessment: Needs assistance Sitting-balance support: Feet supported, No upper extremity supported Sitting balance-Leahy Scale: Fair     Standing balance support: Bilateral upper extremity supported, During functional activity, Reliant on assistive device for balance Standing balance-Leahy Scale: Poor Standing balance comment: Reliant on rollator                            Cognition Arousal: Alert Behavior During Therapy: [facility]  for tasks assessed/performed Overall Cognitive Status: Within Functional Limits for tasks assessed                                          Exercises General Exercises - Lower Extremity Ankle Circles/Pumps: AROM, Both, 10 reps, Seated Long Arc Quad: AROM, Both, 10 reps,  Seated Other Exercises Other Exercises: sit <> stand 3x from recliner using UEs    General Comments General comments (skin integrity, edema, etc.): SpO2 as low as 80s% on 2-4L, rebounded with rest break; BP 114/55 (74) supine, 85/55 (66) sitting, 82/51 (62) sitting with abdominal binder donned, 85/54 (62) standing with abdominal binder donned, 132/107 (114) while ambulating with abdominal binder donned (unsure of accuracy), 86/46 (60) sitting right after ambulating with abdominal binder donned, 93/59 sitting several minutes end of session with abdominal binder donned; pt reported mild lightheadedness with changing positions      Pertinent Vitals/Pain Pain Assessment Pain Assessment: Faces Faces Pain Scale: Hurts a little bit Pain Location: Bil legs Pain Descriptors / Indicators: Discomfort, Grimacing Pain Intervention(s): Limited activity within patient's tolerance, Monitored during session, Repositioned    Home Living                          Prior Function            PT Goals (current goals can now be found in the care plan section) Acute Rehab PT Goals Patient Stated Goal: return home PT Goal Formulation: With patient/family Time For Goal Achievement: 07/03/23 Potential to Achieve Goals: Good Progress towards PT goals: Progressing toward goals    Frequency    Min 1X/week      PT Plan      Co-evaluation              AM-PAC PT "6 Clicks" Mobility   Outcome Measure  Help needed turning from your back to your side while in a flat bed without using bedrails?: None Help needed moving from lying on your back to sitting on the side of a flat bed without using bedrails?: None Help needed moving to and from a bed to a chair (including a wheelchair)?: A Little Help needed standing up from a chair using your arms (e.g., wheelchair or bedside chair)?: A Little Help needed to walk in hospital room?: A Little Help needed climbing 3-5 steps with a railing? : A  Lot 6 Click Score: 19    End of Session Equipment Utilized During Treatment: Oxygen;Gait belt Activity Tolerance: Patient tolerated treatment well;Other (comment) (limited by BP dropping) Patient left: with call bell/phone within reach;with family/visitor present;in chair;with chair alarm set   PT Visit Diagnosis: Muscle weakness (generalized) (M62.81);Pain;Difficulty in walking, not elsewhere classified (R26.2);Unsteadiness on feet (R26.81);Other abnormalities of gait and mobility (R26.89) Pain - Right/Left:  (bilateral) Pain - part of body: Ankle and joints of foot     Time: 1140-1215 PT Time Calculation (min) (ACUTE ONLY): 35 min  Charges:    $Gait Training: 8-22 mins $Therapeutic Activity: 8-22 mins PT General Charges $$ ACUTE PT VISIT: 1 Visit                     Virgil Benedict, PT, DPT Acute Rehabilitation Services  Office: [phone]    Bettina Gavia 06/22/2023, 2:50 PM

## 2023-06-22 NOTE — Progress Notes (Signed)
PROGRESS NOTE    [name]  ZOX:[id number] DOB: 04-12-1956 DOA: 06/18/2023 PCP: Tresa Garter, MD  67/M w hypertension, diastolic CHF, CAD, COPD, follicular lymphoma presented to the ED with worsening swelling of lower extremities extending to abdomen, developed blisters as well  -Patient takes torsemide 3 times a week instead of daily on account of frequent urination -Recent falls, dizziness -In the ED tachypneic O2 sats 80%, hemoglobin 9.2, creatinine 1.9, BNP 09/14/2002, troponin 97, 88   Subjective: Feels better overall, tremors a little better on Sinemet   Assessment and Plan:  Acute on chronic diastolic CHF (congestive heart failure) (HCC) RV failure, severe pulmonary hypertension Acute hypoxic respiratory failure - last echo 9/23 noted EF 60 to 65%, mild LVH, RV preserved, RV  -Repeat echo now with EF 55-60%, moderate LVH, RV overload, moderately reduced RV function, severely elevated PASP -Diuresing on IV Lasix he is 13.6 L negative, cardiology following  -Continue IV Lasix, Farxiga and Aldactone, started Diamox yesterday -Severe pulmonary hypertension likely related to underlying COPD, untreated sleep apnea and current overloaded state -Wean O2, out of bed, PT OT eval> home health services recommended  Leg wounds, fluid blisters -Secondary to above, continue wound care, followed at the wound center weekly, last changed on Monday, needs dressing change weekly  Worsening anemia, iron deficiency -Likely multifactorial, has lymphoma as well -Anemia panel with severe iron deficiency, will add IV iron today  Suspected sleep apnea -Likely contributing to his RV failure, diagnosed many years ago could not tolerate CPAP -Retrial CPAP as inpatient  Atrial flutter (HCC) -Started on metoprolol and apixaban CHA2DS2 vasc score is elevated at 5.  -Cards following, possible cardioversion when volume status has improved  Tremors, ataxia -Per spouse, symptoms ongoing  for at least 6 months, high suspicion of Parkinson's or parkinsonian syndrome, has pill-rolling tremor and shuffling gait, trial of low-dose Sinemet -Will need neurology follow-up after discharge -PT OT eval  Acute kidney injury superimposed on chronic kidney disease (HCC) CKD stage 3a -Cardiorenal, improving with diuresis  Essential hypertension -Improving, meds as above  CAD S/P percutaneous coronary angioplasty Elevated troponin -Suspect demand ischemia in the setting of above, continue apixaban, statin  COPD mixed type (HCC) No clinical signs of exacerbation Continue bronchodilator therapy.   History of follicular lymphoma Follow up as outpatient.  With Dr. Bertis Ruddy  Open wound of lower extremity without complication, subsequent encounter Continue wound care.   Thrombocytopenia (HCC) Stable follow up cell count as outpatient.   Obesity (BMI 30-39.9) Calculated BMI is 35.5 consistent with obesity class 2.   Falls PT and OT  DVT prophylaxis: apixaban Code Status: Full code Family Communication: Wife at bedside yesterday Disposition Plan: May need rehab  Consultants:    Procedures:   Antimicrobials:    Objective: Vitals:   06/21/23 1942 06/22/23 0016 06/22/23 0349 06/22/23 0725  BP: (!) 106/53 115/60 109/61 119/84  Pulse: 82 82  79  Resp: 16 20  17   Temp: 97.7 F (36.5 C) 99.1 F (37.3 C) 98.7 F (37.1 C) 97.7 F (36.5 C)  TempSrc: Oral Oral Oral Oral  SpO2: 100% 100%  100%  Weight:      Height:        Intake/Output Summary (Last 24 hours) at 06/22/2023 1024 Last data filed at 06/22/2023 0948 Gross per 24 hour  Intake 780 ml  Output 4050 ml  Net -3270 ml   Filed Weights   06/19/23 0511 06/20/23 0402 06/21/23 0342  Weight: 119 kg 115.8 kg  114.4 kg    Examination:  Obese chronically ill male sitting up in bed, AAOx3 HEENT: Positive JVD CVS: S1-S2, regular rhythm Lungs: Decreased breath sounds at the bases, few expiratory wheezes Abdomen:  Soft, obese, distended with some edema Extremities: 1+ edema, Unna boots on, upper thighs are swollen Neuro: Tremors, slightly increased muscle tone   Data Reviewed:   CBC: Recent Labs  Lab 06/18/23 1213 06/21/23 0500  WBC 7.6 7.4  NEUTROABS  --  4.9  HGB 9.2* 8.2*  HCT 31.3* 28.4*  MCV 96.9 99.3  PLT 136* 126*   Basic Metabolic Panel: Recent Labs  Lab 06/18/23 1213 06/19/23 0517 06/20/23 0315 06/21/23 0500 06/22/23 0330  NA 137 139 140 139 140  K 4.2 3.8 3.4* 3.9 3.7  CL 84* 85* 83* 81* 82*  CO2 40* 43* >45* >45* >45*  GLUCOSE 91 111* 108* 116* 122*  BUN 42* 43* 35* 33* 33*  CREATININE 1.96* 1.76* 1.58* 1.28* 1.38*  CALCIUM 8.8* 8.5* 8.4* 8.4* 8.7*  MG  --   --  2.1  --   --    GFR: Estimated Creatinine Clearance: 66 mL/min (A) (by C-G formula based on SCr of 1.38 mg/dL (H)). Liver Function Tests: Recent Labs  Lab 06/18/23 1351  AST 26  ALT 15  ALKPHOS 81  BILITOT 1.1  PROT 6.8  ALBUMIN 3.5   No results for input(s): "LIPASE", "AMYLASE" in the last 168 hours. No results for input(s): "AMMONIA" in the last 168 hours. Coagulation Profile: No results for input(s): "INR", "PROTIME" in the last 168 hours. Cardiac Enzymes: No results for input(s): "CKTOTAL", "CKMB", "CKMBINDEX", "TROPONINI" in the last 168 hours. BNP (last 3 results) No results for input(s): "PROBNP" in the last 8760 hours. HbA1C: No results for input(s): "HGBA1C" in the last 72 hours. CBG: No results for input(s): "GLUCAP" in the last 168 hours. Lipid Profile: No results for input(s): "CHOL", "HDL", "LDLCALC", "TRIG", "CHOLHDL", "LDLDIRECT" in the last 72 hours. Thyroid Function Tests: No results for input(s): "TSH", "T4TOTAL", "FREET4", "T3FREE", "THYROIDAB" in the last 72 hours. Anemia Panel: Recent Labs    06/22/23 0332  VITAMINB12 562  FOLATE 10.5  FERRITIN 27  TIBC 419  IRON 23*  RETICCTPCT 2.4   Urine analysis:    Component Value Date/Time   COLORURINE YELLOW  06/21/2023 0024   APPEARANCEUR CLEAR 06/21/2023 0024   LABSPEC 1.010 06/21/2023 0024   PHURINE 6.0 06/21/2023 0024   GLUCOSEU >=500 (A) 06/21/2023 0024   GLUCOSEU NEGATIVE 02/21/2023 1201   HGBUR LARGE (A) 06/21/2023 0024   BILIRUBINUR NEGATIVE 06/21/2023 0024   KETONESUR NEGATIVE 06/21/2023 0024   PROTEINUR NEGATIVE 06/21/2023 0024   UROBILINOGEN 1.0 02/21/2023 1201   NITRITE NEGATIVE 06/21/2023 0024   LEUKOCYTESUR NEGATIVE 06/21/2023 0024   Sepsis Labs: @LABRCNTIP (procalcitonin:4,lacticidven:4)  ) Recent Results (from the past 240 hour(s))  Resp panel by RT-PCR (RSV, Flu A&B, Covid) Anterior Nasal Swab     Status: None   Collection Time: 06/20/23 11:07 PM   Specimen: Anterior Nasal Swab  Result Value Ref Range Status   SARS Coronavirus 2 by RT PCR NEGATIVE NEGATIVE Final   Influenza A by PCR NEGATIVE NEGATIVE Final   Influenza B by PCR NEGATIVE NEGATIVE Final    Comment: (NOTE) The Xpert Xpress SARS-CoV-2/FLU/RSV plus assay is intended as an aid in the diagnosis of influenza from Nasopharyngeal swab specimens and should not be used as a sole basis for treatment. Nasal washings and aspirates are unacceptable for Xpert Xpress SARS-CoV-2/FLU/RSV testing.  Fact Sheet for Patients: BloggerCourse.com  Fact Sheet for Healthcare Providers: SeriousBroker.it  This test is not yet approved or cleared by the [place] FDA and has been authorized for detection and/or diagnosis of SARS-CoV-2 by FDA under an Emergency Use Authorization (EUA). This EUA will remain in effect (meaning this test can be used) for the duration of the COVID-19 declaration under Section 564(b)(1) of the Act, 21 U.S.C. section 360bbb-3(b)(1), unless the authorization is terminated or revoked.     Resp Syncytial Virus by PCR NEGATIVE NEGATIVE Final    Comment: (NOTE) Fact Sheet for Patients: BloggerCourse.com  Fact Sheet for  Healthcare Providers: SeriousBroker.it  This test is not yet approved or cleared by the [place] FDA and has been authorized for detection and/or diagnosis of SARS-CoV-2 by FDA under an Emergency Use Authorization (EUA). This EUA will remain in effect (meaning this test can be used) for the duration of the COVID-19 declaration under Section 564(b)(1) of the Act, 21 U.S.C. section 360bbb-3(b)(1), unless the authorization is terminated or revoked.  Performed at [facility], [address], [place], [place] [address]      Radiology Studies: DG CHEST PORT 1 VIEW  Result Date: 06/21/2023 CLINICAL DATA:  Community-acquired pneumonia EXAM: PORTABLE CHEST 1 VIEW COMPARISON:  06/18/2023 FINDINGS: Coronary artery bypass grafting has been performed. Stable cardiomegaly. The lungs are symmetrically well expanded. Central pulmonary vascular engorgement and superimposed mild bilateral interstitial pulmonary edema as well as small bilateral pleural effusions appears slightly progressive in keeping with changes of progressive cardiogenic failure. No pneumothorax. IMPRESSION: 1. Progressive cardiogenic failure.  Stable cardiomegaly. Electronically Signed   By: Helyn Numbers M.D.   On: 06/21/2023 00:20     Scheduled Meds:  acetaZOLAMIDE  250 mg Oral BID   apixaban  5 mg Oral BID   carbidopa-levodopa  1 tablet Oral TID   clonazePAM  1 mg Oral QHS   dapagliflozin propanediol  10 mg Oral Daily   furosemide  80 mg Intravenous BID   metoprolol succinate  12.5 mg Oral Daily   potassium chloride  40 mEq Oral Once   simvastatin  20 mg Oral QHS   sodium chloride flush  3 mL Intravenous Q12H   spironolactone  12.5 mg Oral Daily   Continuous Infusions:  iron sucrose 300 mg (06/22/23 0948)      LOS: 4 days    Time spent:    Zannie Cove, MD [facility]   06/22/2023, 10:24 AM

## 2023-06-22 NOTE — Progress Notes (Signed)
   Heart Failure Stewardship Pharmacist Progress Note   PCP: Plotnikov, Georgina Quint, MD PCP-Cardiologist: Charlton Haws, MD    HPI:  67 yo M with PMH of CHF, HTN ,HLD, CKD III, obesity, OSA, anemia, COPD, CAD s/p CABG x 3, and follicular lymphoma.   Presented to the ED on 11/4 from his wound care center with shortness of breath and hypoxia. Also reported LE edema in feet and abdomen. Has not been taking his torsemide regularly (only 3 times a week) to avoid frequent urination. CXR with pulmonary edema and cardiomegaly. BNP 2104.7. ECHO 11/5 with EF 55-60% (was 60-65% 04/2022), no RWMA, moderate concentric LVH, RV moderately reduced, severely elevated PA pressure, mild MR.   Still fluid overloaded on exam. On 4L O2 (on 2L at home). Wife is concerned about orthostatic hypotension.   Current HF Medications: Diuretic: furosemide 80 mg IV BID + diamox 250 mg BID Beta Blocker: metoprolol XL 12.5 mg daily MRA: spironolactone 12.5 mg daily SGLT2i: Farxiga 10 mg daily  Prior to admission HF Medications: Diuretic: torsemide 20 mg daily + metolazone 5 mg daily PRN Beta blocker: metoprolol XL 12.5 mg BID  Pertinent Lab Values: Serum creatinine 1.38, BUN 33, Potassium 3.7, CO2 >45, Sodium 140, BNP 2104.7, Magnesium 2.1  Vital Signs: Weight: 252 lbs (admission weight: 262 lbs) Blood pressure: 110/60s  Heart rate: 70-80s  I/O: net -2.9L yesterday; net -14.6L since admission  Medication Assistance / Insurance Benefits Check: Does the patient have prescription insurance?  Yes Type of insurance plan: Medicare + supplement  Outpatient Pharmacy:  Prior to admission outpatient [facility] Is the patient willing to use [facility] pharmacy at discharge? Yes Is the patient willing to transition their outpatient pharmacy to utilize a [facility] outpatient pharmacy?   No    Assessment: 1. Acute on chronic diastolic CHF (LVEF 55-60%). NYHA class III symptoms. - Remains fluid overloaded on exam with 3+  edema. Dyspnea unchanged from yesterday despite another net -3L yesterday. No weight recorded today. Continue furosemide 80 mg IV BID, on diamox 250 mg BID. Reports he was not taking metolazone PTA due to orthostasis. Strict I/Os and daily weights. Keep K>4 and Mg>2. - Continue metoprolol XL 12.5 mg daily (reported taking BID PTA) - Continue spironolactone 12.5 mg daily - Continue Farxiga 10 mg daily   Plan: 1) Medication changes recommended at this time: - Continue IV diuresis   2) Patient assistance: - Farxiga copay $8.40 x 90 day supply  3)  Education  - Patient has been educated on current HF medications and potential additions to HF medication regimen - Patient verbalizes understanding that over the next few months, these medication doses may change and more medications may be added to optimize HF regimen - Patient has been educated on basic disease state pathophysiology and goals of therapy   Sharen Hones, PharmD, BCPS Heart Failure Stewardship Pharmacist Phone [phone]

## 2023-06-23 DIAGNOSIS — I483 Typical atrial flutter: Secondary | ICD-10-CM | POA: Diagnosis not present

## 2023-06-23 DIAGNOSIS — N179 Acute kidney failure, unspecified: Secondary | ICD-10-CM | POA: Diagnosis not present

## 2023-06-23 DIAGNOSIS — I2721 Secondary pulmonary arterial hypertension: Secondary | ICD-10-CM

## 2023-06-23 DIAGNOSIS — I5033 Acute on chronic diastolic (congestive) heart failure: Secondary | ICD-10-CM | POA: Diagnosis not present

## 2023-06-23 DIAGNOSIS — I2781 Cor pulmonale (chronic): Secondary | ICD-10-CM

## 2023-06-23 LAB — CBC
HCT: 28 % — ABNORMAL LOW (ref 39.0–52.0)
Hemoglobin: 7.9 g/dL — ABNORMAL LOW (ref 13.0–17.0)
MCH: 28.6 pg (ref 26.0–34.0)
MCHC: 28.2 g/dL — ABNORMAL LOW (ref 30.0–36.0)
MCV: 101.4 fL — ABNORMAL HIGH (ref 80.0–100.0)
Platelets: 141 10*3/uL — ABNORMAL LOW (ref 150–400)
RBC: 2.76 MIL/uL — ABNORMAL LOW (ref 4.22–5.81)
RDW: 18.6 % — ABNORMAL HIGH (ref 11.5–15.5)
WBC: 7.5 10*3/uL (ref 4.0–10.5)
nRBC: 0 % (ref 0.0–0.2)

## 2023-06-23 LAB — BASIC METABOLIC PANEL
Anion gap: 15 (ref 5–15)
BUN: 34 mg/dL — ABNORMAL HIGH (ref 8–23)
CO2: 44 mmol/L — ABNORMAL HIGH (ref 22–32)
Calcium: 9.1 mg/dL (ref 8.9–10.3)
Chloride: 82 mmol/L — ABNORMAL LOW (ref 98–111)
Creatinine, Ser: 1.27 mg/dL — ABNORMAL HIGH (ref 0.61–1.24)
GFR, Estimated: >60 mL/min (ref >60)
Glucose, Bld: 108 mg/dL — ABNORMAL HIGH (ref 70–99)
Potassium: 4.2 mmol/L (ref 3.5–5.1)
Sodium: 141 mmol/L (ref 135–145)

## 2023-06-23 LAB — AMMONIA: Ammonia: 129 umol/L — ABNORMAL HIGH (ref 9–35)

## 2023-06-23 MED ORDER — CARBIDOPA-LEVODOPA 10-100 MG PO TABS
2.0000 | ORAL_TABLET | Freq: Three times a day (TID) | ORAL | Status: DC
  Administered 2023-06-23: 2 via ORAL
  Filled 2023-06-23 (×4): qty 2

## 2023-06-23 MED ORDER — CLONAZEPAM 0.5 MG PO TABS
0.5000 mg | ORAL_TABLET | Freq: Every day | ORAL | Status: DC
Start: 2023-06-23 — End: 2023-06-24
  Administered 2023-06-23: 0.5 mg via ORAL
  Filled 2023-06-23: qty 1

## 2023-06-23 MED ORDER — CARBIDOPA-LEVODOPA 10-100 MG PO TABS
1.0000 | ORAL_TABLET | Freq: Three times a day (TID) | ORAL | Status: DC
Start: 2023-06-24 — End: 2023-06-24

## 2023-06-23 MED ORDER — LACTULOSE 10 GM/15ML PO SOLN
30.0000 g | Freq: Two times a day (BID) | ORAL | Status: DC
  Administered 2023-06-23: 30 g via ORAL
  Filled 2023-06-23: qty 45

## 2023-06-23 NOTE — Progress Notes (Signed)
PROGRESS NOTE    [name]  XLK:[id number] DOB: 03/24/1956 DOA: 06/18/2023 PCP: Tresa Garter, MD  67/M w hypertension, diastolic CHF, CAD, COPD, follicular lymphoma presented to the ED with worsening swelling of lower extremities extending to abdomen, developed blisters as well  -Patient takes torsemide 3 times a week instead of daily on account of frequent urination -Recent falls, dizziness -In the ED tachypneic O2 sats 80%, hemoglobin 9.2, creatinine 1.9, BNP 09/14/2002, troponin 97, 88   Subjective: Feels better overall, tremors a little better on Sinemet  Assessment and Plan:  Acute on chronic diastolic CHF (congestive heart failure) (HCC) RV failure, severe pulmonary hypertension Acute hypoxic respiratory failure - last echo 9/23 noted EF 60 to 65%, mild LVH, RV preserved, RV  -Repeat echo now with EF 55-60%, moderate LVH, RV overload, moderately reduced RV function, severely elevated PASP -Diuresing on IV Lasix he is 17 L negative, cardiology following  -Continue IV Lasix, Farxiga and Aldactone, started Diamox  -Severe pulmonary hypertension likely related to underlying COPD, untreated sleep apnea and current overloaded state -Wean O2, out of bed, PT OT eval> home health services recommended  Leg wounds, fluid blisters -Secondary to above, continue wound care, followed at the wound center weekly, last changed on Monday, needs dressing change weekly  Worsening anemia, iron deficiency -Likely multifactorial, has lymphoma as well -Anemia panel with severe iron deficiency, given IV iron yesterday, may need repeat  Suspected sleep apnea -Likely contributing to his RV failure, diagnosed many years ago could not tolerate CPAP -Retrial CPAP as inpatient  Atrial flutter (HCC) -Started on metoprolol and apixaban CHA2DS2 vasc score is elevated at 5.  -Cards following, possible cardioversion when volume status has improved  Tremors, ataxia -Per spouse, symptoms  ongoing for at least 6 months, high suspicion of Parkinson's or parkinsonian syndrome, has pill-rolling tremor and shuffling gait, start on trial of Sinemet -Will need neurology follow-up after discharge -PT OT eval  Acute kidney injury superimposed on chronic kidney disease (HCC) CKD stage 3a -Cardiorenal, improving with diuresis  Essential hypertension -Improving, meds as above  CAD S/P percutaneous coronary angioplasty Elevated troponin -Suspect demand ischemia in the setting of above, continue apixaban, statin  COPD mixed type (HCC) No clinical signs of exacerbation Continue bronchodilator therapy.   History of follicular lymphoma Follow up as outpatient.  With Dr. Bertis Ruddy  Open wound of lower extremity without complication, subsequent encounter Continue wound care.   Thrombocytopenia (HCC) Stable follow up cell count as outpatient.   Obesity (BMI 30-39.9) Calculated BMI is 35.5 consistent with obesity class 2.   Falls PT and OT  DVT prophylaxis: apixaban Code Status: Full code Family Communication: Wife at bedside yesterday Disposition Plan: May need rehab  Consultants:    Procedures:   Antimicrobials:    Objective: Vitals:   06/22/23 2206 06/22/23 2317 06/23/23 0345 06/23/23 0845  BP:  112/60 115/65 107/60  Pulse:  79 88 90  Resp:  16 20   Temp:  97.9 F (36.6 C) 98.6 F (37 C) 98.6 F (37 C)  TempSrc:  Oral Oral Oral  SpO2: 99% 99% 95% 96%  Weight:   113.9 kg   Height:        Intake/Output Summary (Last 24 hours) at 06/23/2023 1143 Last data filed at 06/23/2023 0700 Gross per 24 hour  Intake 300 ml  Output 2500 ml  Net -2200 ml   Filed Weights   06/20/23 0402 06/21/23 0342 06/23/23 0345  Weight: 115.8 kg 114.4 kg 113.9  kg    Examination:  Obese chronically ill male sitting up in bed, AAOx3 HEENT: neck obese  CVS: S1-S2 regular rhythm Lungs: Few scattered rhonchi Abdomen: Soft, obese, nontender, abdominal wall edema  noted Extremities: Both lower extremities with dressing, and wounds, 1+ edema  Neuro: Tremors, slightly increased muscle tone   Data Reviewed:   CBC: Recent Labs  Lab 06/18/23 1213 06/21/23 0500 06/23/23 0227  WBC 7.6 7.4 7.5  NEUTROABS  --  4.9  --   HGB 9.2* 8.2* 7.9*  HCT 31.3* 28.4* 28.0*  MCV 96.9 99.3 101.4*  PLT 136* 126* 141*   Basic Metabolic Panel: Recent Labs  Lab 06/19/23 0517 06/20/23 0315 06/21/23 0500 06/22/23 0330 06/23/23 0227  NA 139 140 139 140 141  K 3.8 3.4* 3.9 3.7 4.2  CL 85* 83* 81* 82* 82*  CO2 43* >45* >45* >45* 44*  GLUCOSE 111* 108* 116* 122* 108*  BUN 43* 35* 33* 33* 34*  CREATININE 1.76* 1.58* 1.28* 1.38* 1.27*  CALCIUM 8.5* 8.4* 8.4* 8.7* 9.1  MG  --  2.1  --   --   --    GFR: Estimated Creatinine Clearance: 71.5 mL/min (A) (by C-G formula based on SCr of 1.27 mg/dL (H)). Liver Function Tests: Recent Labs  Lab 06/18/23 1351  AST 26  ALT 15  ALKPHOS 81  BILITOT 1.1  PROT 6.8  ALBUMIN 3.5   No results for input(s): "LIPASE", "AMYLASE" in the last 168 hours. No results for input(s): "AMMONIA" in the last 168 hours. Coagulation Profile: No results for input(s): "INR", "PROTIME" in the last 168 hours. Cardiac Enzymes: No results for input(s): "CKTOTAL", "CKMB", "CKMBINDEX", "TROPONINI" in the last 168 hours. BNP (last 3 results) No results for input(s): "PROBNP" in the last 8760 hours. HbA1C: No results for input(s): "HGBA1C" in the last 72 hours. CBG: No results for input(s): "GLUCAP" in the last 168 hours. Lipid Profile: No results for input(s): "CHOL", "HDL", "LDLCALC", "TRIG", "CHOLHDL", "LDLDIRECT" in the last 72 hours. Thyroid Function Tests: No results for input(s): "TSH", "T4TOTAL", "FREET4", "T3FREE", "THYROIDAB" in the last 72 hours. Anemia Panel: Recent Labs    06/22/23 0332  VITAMINB12 562  FOLATE 10.5  FERRITIN 27  TIBC 419  IRON 23*  RETICCTPCT 2.4   Urine analysis:    Component Value Date/Time    COLORURINE YELLOW 06/21/2023 0024   APPEARANCEUR CLEAR 06/21/2023 0024   LABSPEC 1.010 06/21/2023 0024   PHURINE 6.0 06/21/2023 0024   GLUCOSEU >=500 (A) 06/21/2023 0024   GLUCOSEU NEGATIVE 02/21/2023 1201   HGBUR LARGE (A) 06/21/2023 0024   BILIRUBINUR NEGATIVE 06/21/2023 0024   KETONESUR NEGATIVE 06/21/2023 0024   PROTEINUR NEGATIVE 06/21/2023 0024   UROBILINOGEN 1.0 02/21/2023 1201   NITRITE NEGATIVE 06/21/2023 0024   LEUKOCYTESUR NEGATIVE 06/21/2023 0024   Sepsis Labs: @LABRCNTIP (procalcitonin:4,lacticidven:4)  ) Recent Results (from the past 240 hour(s))  Resp panel by RT-PCR (RSV, Flu A&B, Covid) Anterior Nasal Swab     Status: None   Collection Time: 06/20/23 11:07 PM   Specimen: Anterior Nasal Swab  Result Value Ref Range Status   SARS Coronavirus 2 by RT PCR NEGATIVE NEGATIVE Final   Influenza A by PCR NEGATIVE NEGATIVE Final   Influenza B by PCR NEGATIVE NEGATIVE Final    Comment: (NOTE) The Xpert Xpress SARS-CoV-2/FLU/RSV plus assay is intended as an aid in the diagnosis of influenza from Nasopharyngeal swab specimens and should not be used as a sole basis for treatment. Nasal washings and aspirates are  unacceptable for Xpert Xpress SARS-CoV-2/FLU/RSV testing.  Fact Sheet for Patients: BloggerCourse.com  Fact Sheet for Healthcare Providers: SeriousBroker.it  This test is not yet approved or cleared by the [place] FDA and has been authorized for detection and/or diagnosis of SARS-CoV-2 by FDA under an Emergency Use Authorization (EUA). This EUA will remain in effect (meaning this test can be used) for the duration of the COVID-19 declaration under Section 564(b)(1) of the Act, 21 U.S.C. section 360bbb-3(b)(1), unless the authorization is terminated or revoked.     Resp Syncytial Virus by PCR NEGATIVE NEGATIVE Final    Comment: (NOTE) Fact Sheet for  Patients: BloggerCourse.com  Fact Sheet for Healthcare Providers: SeriousBroker.it  This test is not yet approved or cleared by the [place] FDA and has been authorized for detection and/or diagnosis of SARS-CoV-2 by FDA under an Emergency Use Authorization (EUA). This EUA will remain in effect (meaning this test can be used) for the duration of the COVID-19 declaration under Section 564(b)(1) of the Act, 21 U.S.C. section 360bbb-3(b)(1), unless the authorization is terminated or revoked.  Performed at [facility], [address], [place], [place] [address]      Radiology Studies: No results found.   Scheduled Meds:  acetaZOLAMIDE  250 mg Oral BID   apixaban  5 mg Oral BID   carbidopa-levodopa  2 tablet Oral TID   clonazePAM  0.5 mg Oral QHS   dapagliflozin propanediol  10 mg Oral Daily   furosemide  80 mg Intravenous BID   metoprolol succinate  12.5 mg Oral Daily   simvastatin  20 mg Oral QHS   sodium chloride flush  3 mL Intravenous Q12H   spironolactone  25 mg Oral Daily   Continuous Infusions:      LOS: 5 days    Time spent:    Zannie Cove, MD [facility]   06/23/2023, 11:43 AM

## 2023-06-23 NOTE — Progress Notes (Addendum)
Mobility Specialist Progress Note    06/23/23 1503  Mobility  Activity Dangled on edge of bed;Stood at bedside  Level of Assistance +2 (takes two people)  Press photographer wheel walker  Activity Response Tolerated fair  Mobility Referral Yes  $Mobility charge 1 Mobility  Mobility Specialist Start Time (ACUTE ONLY) 1423  Mobility Specialist Stop Time (ACUTE ONLY) 1503  Mobility Specialist Time Calculation (min) (ACUTE ONLY) 40 min   Pre-Mobility: 96 HR, 98% SpO2  Pt received sitting up in bed with a wet cough. Pt able to cough up sputum, RN notified. Pt able to blink in response to yes/no, squeeze hand and occasionally groan. Pt modA+2 for bed mobility to sit EOB. Pt then modA+2 to stand from an elevated bed surface. Pt able to stand for ~30 seconds each standing trial. Pt  knees noticeably buckling bilaterally towards end of each standing trial. Completed x3 standing trials and on the last trial able to stand minA+1. Pt maxA+2 to return to supine. Left with call bell in reach and family present. RN aware.   [place] Mobility Specialist  Please Neurosurgeon or Rehab Office at [phone]

## 2023-06-23 NOTE — Plan of Care (Signed)

## 2023-06-23 NOTE — Plan of Care (Signed)
  Problem: Education: Goal: Knowledge of General Education information will improve Description: Including pain rating scale, medication(s)/side effects and non-pharmacologic comfort measures Outcome: Progressing   Problem: Clinical Measurements: Goal: Ability to maintain clinical measurements within normal limits will improve Outcome: Progressing   Problem: Clinical Measurements: Goal: Will remain free from infection Outcome: Progressing   Problem: Clinical Measurements: Goal: Diagnostic test results will improve Outcome: Progressing   Problem: Clinical Measurements: Goal: Respiratory complications will improve Outcome: Progressing   Problem: Activity: Goal: Risk for activity intolerance will decrease Outcome: Progressing   Problem: Nutrition: Goal: Adequate nutrition will be maintained Outcome: Progressing

## 2023-06-23 NOTE — Progress Notes (Signed)
   Patient Name: [name] Date of Encounter: 06/23/2023 [facility] Cardiologist: Charlton Haws, MD   Interval Summary  .    Reports he is breathing okay. Has a rather weak and wet sounding cough, but does not appear to be in any respiratory difficulty. Walked 5 feet with physical therapy. Net diuresis 16.8 L since admission, net -3 L in last 24 hours.  Weight down 10 pounds since admission, much less than anticipated for mild diuresis. Renal function continues to improve with diuretics, although not back to baseline (0.9-1.0).  Normal electrolytes.  Vital Signs .    Vitals:   06/22/23 2206 06/22/23 2317 06/23/23 0345 06/23/23 0845  BP:  112/60 115/65 107/60  Pulse:  79 88 90  Resp:  16 20   Temp:  97.9 F (36.6 C) 98.6 F (37 C) 98.6 F (37 C)  TempSrc:  Oral Oral Oral  SpO2: 99% 99% 95% 96%  Weight:   113.9 kg   Height:        Intake/Output Summary (Last 24 hours) at 06/23/2023 0908 Last data filed at 06/23/2023 0700 Gross per 24 hour  Intake 300 ml  Output 3500 ml  Net -3200 ml      06/23/2023    3:45 AM 06/21/2023    3:42 AM 06/20/2023    4:02 AM  Last 3 Weights  Weight (lbs) 251 lb 1.7 oz 252 lb 1.6 oz 255 lb 6.4 oz  Weight (kg) 113.9 kg 114.352 kg 115.849 kg      Telemetry/ECG    Atrial flutter with controlled ventricular response- Personally Reviewed  Physical Exam .   GEN: No acute distress.   Neck: Very difficult to see JVD Cardiac: RRR, no murmurs, rubs, or gallops.  Respiratory: Bilateral rhonchi, diminished in bases, no wheezes or wet rales. GI: Soft, nontender, abdominal wall edema MS: Legs wrapped, still has some edema  Assessment & Plan .     Acute on chronic HFpEF/RV failure: Still hypervolemic.  Renal parameters improving with diuresis.  Continue IV diuretics.  Primarily has right ventricular failure, although he does have LVH and there is probably a component of diastolic left heart failure as well. PAH: Severe (estimated  systolic PAP around 70 mmHg by echo). 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  Important to avoid hypoxemia. Atrial flutter: Good rate control.  Looking through old records it is possible that he has been in atrial flutter for the last 8 months, so it may not be possible to return to normal rhythm.  Consider cardioversion after minimum of 3 weeks of anticoagulation. CAD: Does not have angina pectoris.  Remote history of CABG and PCI.  Antiplatelet agents discontinued to allow anticoagulation.  On statin. AKI: Renal parameters are improving with diuresis.  Note baseline creatinine 0.9-1.1 on multiple labs performed the last 12 months. OSA/COPD: Noncompliant with CPAP.  This is the major cause for his heart failure and atrial flutter. History of HIT History of L carotid artery endarterectomy  For questions or updates, please contact [facility] Please consult [email or web address] for contact info under        Signed, Thurmon Fair, MD

## 2023-06-24 ENCOUNTER — Inpatient Hospital Stay (HOSPITAL_COMMUNITY): Payer: Medicare Other

## 2023-06-24 DIAGNOSIS — R570 Cardiogenic shock: Secondary | ICD-10-CM | POA: Diagnosis not present

## 2023-06-24 DIAGNOSIS — E162 Hypoglycemia, unspecified: Secondary | ICD-10-CM

## 2023-06-24 DIAGNOSIS — E7220 Disorder of urea cycle metabolism, unspecified: Secondary | ICD-10-CM

## 2023-06-24 DIAGNOSIS — I2781 Cor pulmonale (chronic): Secondary | ICD-10-CM | POA: Diagnosis not present

## 2023-06-24 DIAGNOSIS — I50810 Right heart failure, unspecified: Secondary | ICD-10-CM | POA: Diagnosis not present

## 2023-06-24 DIAGNOSIS — Z7189 Other specified counseling: Secondary | ICD-10-CM

## 2023-06-24 DIAGNOSIS — N179 Acute kidney failure, unspecified: Secondary | ICD-10-CM | POA: Diagnosis not present

## 2023-06-24 DIAGNOSIS — J449 Chronic obstructive pulmonary disease, unspecified: Secondary | ICD-10-CM | POA: Diagnosis not present

## 2023-06-24 DIAGNOSIS — E872 Acidosis, unspecified: Secondary | ICD-10-CM

## 2023-06-24 DIAGNOSIS — D509 Iron deficiency anemia, unspecified: Secondary | ICD-10-CM

## 2023-06-24 DIAGNOSIS — I2721 Secondary pulmonary arterial hypertension: Secondary | ICD-10-CM

## 2023-06-24 DIAGNOSIS — G4733 Obstructive sleep apnea (adult) (pediatric): Secondary | ICD-10-CM | POA: Diagnosis not present

## 2023-06-24 DIAGNOSIS — A419 Sepsis, unspecified organism: Secondary | ICD-10-CM | POA: Diagnosis not present

## 2023-06-24 DIAGNOSIS — G9340 Encephalopathy, unspecified: Secondary | ICD-10-CM

## 2023-06-24 DIAGNOSIS — I5033 Acute on chronic diastolic (congestive) heart failure: Secondary | ICD-10-CM | POA: Diagnosis not present

## 2023-06-24 DIAGNOSIS — R6521 Severe sepsis with septic shock: Secondary | ICD-10-CM

## 2023-06-24 LAB — BLOOD GAS, VENOUS
Acid-Base Excess: 17.2 mmol/L — ABNORMAL HIGH (ref 0.0–2.0)
Bicarbonate: 43.8 mmol/L — ABNORMAL HIGH (ref 20.0–28.0)
Drawn by: 8194
O2 Saturation: 82.7 %
Patient temperature: 38
pCO2, Ven: 69 mm[Hg] — ABNORMAL HIGH (ref 44–60)
pH, Ven: 7.42 (ref 7.25–7.43)
pO2, Ven: 56 mm[Hg] — ABNORMAL HIGH (ref 32–45)

## 2023-06-24 LAB — BLOOD GAS, ARTERIAL
Acid-Base Excess: 15.7 mmol/L — ABNORMAL HIGH (ref 0.0–2.0)
Bicarbonate: 43 mmol/L — ABNORMAL HIGH (ref 20.0–28.0)
O2 Saturation: 96.2 %
Patient temperature: 38
pCO2 arterial: 74 mm[Hg] (ref 32–48)
pH, Arterial: 7.38 (ref 7.35–7.45)
pO2, Arterial: 92 mm[Hg] (ref 83–108)

## 2023-06-24 LAB — POCT I-STAT 7, (LYTES, BLD GAS, ICA,H+H)
Acid-Base Excess: 15 mmol/L — ABNORMAL HIGH (ref 0.0–2.0)
Bicarbonate: 41.2 mmol/L — ABNORMAL HIGH (ref 20.0–28.0)
Calcium, Ion: 1.03 mmol/L — ABNORMAL LOW (ref 1.15–1.40)
HCT: 28 % — ABNORMAL LOW (ref 39.0–52.0)
Hemoglobin: 9.5 g/dL — ABNORMAL LOW (ref 13.0–17.0)
O2 Saturation: 100 %
Patient temperature: 98.6
Potassium: 4.2 mmol/L (ref 3.5–5.1)
Sodium: 136 mmol/L (ref 135–145)
TCO2: 43 mmol/L — ABNORMAL HIGH (ref 22–32)
pCO2 arterial: 58.3 mm[Hg] — ABNORMAL HIGH (ref 32–48)
pH, Arterial: 7.456 — ABNORMAL HIGH (ref 7.35–7.45)
pO2, Arterial: 226 mm[Hg] — ABNORMAL HIGH (ref 83–108)

## 2023-06-24 LAB — BASIC METABOLIC PANEL
Anion gap: 24 — ABNORMAL HIGH (ref 5–15)
Anion gap: 17 — ABNORMAL HIGH (ref 5–15)
BUN: 57 mg/dL — ABNORMAL HIGH (ref 8–23)
BUN: 70 mg/dL — ABNORMAL HIGH (ref 8–23)
CO2: 36 mmol/L — ABNORMAL HIGH (ref 22–32)
CO2: 38 mmol/L — ABNORMAL HIGH (ref 22–32)
Calcium: 9.1 mg/dL (ref 8.9–10.3)
Calcium: 8.7 mg/dL — ABNORMAL LOW (ref 8.9–10.3)
Chloride: 82 mmol/L — ABNORMAL LOW (ref 98–111)
Chloride: 83 mmol/L — ABNORMAL LOW (ref 98–111)
Creatinine, Ser: 2.46 mg/dL — ABNORMAL HIGH (ref 0.61–1.24)
Creatinine, Ser: 2.47 mg/dL — ABNORMAL HIGH (ref 0.61–1.24)
GFR, Estimated: 28 mL/min — ABNORMAL LOW (ref >60)
GFR, Estimated: 28 mL/min — ABNORMAL LOW (ref >60)
Glucose, Bld: 61 mg/dL — ABNORMAL LOW (ref 70–99)
Glucose, Bld: 137 mg/dL — ABNORMAL HIGH (ref 70–99)
Potassium: 4.9 mmol/L (ref 3.5–5.1)
Potassium: 4.2 mmol/L (ref 3.5–5.1)
Sodium: 142 mmol/L (ref 135–145)
Sodium: 138 mmol/L (ref 135–145)

## 2023-06-24 LAB — CBC WITH DIFFERENTIAL/PLATELET
Abs Immature Granulocytes: 0.17 10*3/uL — ABNORMAL HIGH (ref 0.00–0.07)
Basophils Absolute: 0 10*3/uL (ref 0.0–0.1)
Basophils Relative: 0 %
Eosinophils Absolute: 0 10*3/uL (ref 0.0–0.5)
Eosinophils Relative: 0 %
HCT: 28.7 % — ABNORMAL LOW (ref 39.0–52.0)
Hemoglobin: 8 g/dL — ABNORMAL LOW (ref 13.0–17.0)
Immature Granulocytes: 2 %
Lymphocytes Relative: 2 %
Lymphs Abs: 0.2 10*3/uL — ABNORMAL LOW (ref 0.7–4.0)
MCH: 28.3 pg (ref 26.0–34.0)
MCHC: 27.9 g/dL — ABNORMAL LOW (ref 30.0–36.0)
MCV: 101.4 fL — ABNORMAL HIGH (ref 80.0–100.0)
Monocytes Absolute: 2.5 10*3/uL — ABNORMAL HIGH (ref 0.1–1.0)
Monocytes Relative: 22 %
Neutro Abs: 8.4 10*3/uL — ABNORMAL HIGH (ref 1.7–7.7)
Neutrophils Relative %: 74 %
Platelets: 159 10*3/uL (ref 150–400)
RBC: 2.83 MIL/uL — ABNORMAL LOW (ref 4.22–5.81)
RDW: 19.2 % — ABNORMAL HIGH (ref 11.5–15.5)
WBC: 11.3 10*3/uL — ABNORMAL HIGH (ref 4.0–10.5)
nRBC: 0.2 % (ref 0.0–0.2)

## 2023-06-24 LAB — CBC
HCT: 30.2 % — ABNORMAL LOW (ref 39.0–52.0)
Hemoglobin: 8.3 g/dL — ABNORMAL LOW (ref 13.0–17.0)
MCH: 28 pg (ref 26.0–34.0)
MCHC: 27.5 g/dL — ABNORMAL LOW (ref 30.0–36.0)
MCV: 102 fL — ABNORMAL HIGH (ref 80.0–100.0)
Platelets: 186 10*3/uL (ref 150–400)
RBC: 2.96 MIL/uL — ABNORMAL LOW (ref 4.22–5.81)
RDW: 19.6 % — ABNORMAL HIGH (ref 11.5–15.5)
WBC: 10.6 10*3/uL — ABNORMAL HIGH (ref 4.0–10.5)
nRBC: 0.2 % (ref 0.0–0.2)

## 2023-06-24 LAB — ECHOCARDIOGRAM LIMITED
Calc EF: 29 %
Height: 72 in
S' Lateral: 4.4 cm
Single Plane A2C EF: 29.6 %
Single Plane A4C EF: 30.4 %
Weight: 4017.66 [oz_av]

## 2023-06-24 LAB — COMPREHENSIVE METABOLIC PANEL
ALT: 127 U/L — ABNORMAL HIGH (ref 0–44)
AST: 378 U/L — ABNORMAL HIGH (ref 15–41)
Albumin: 3 g/dL — ABNORMAL LOW (ref 3.5–5.0)
Alkaline Phosphatase: 74 U/L (ref 38–126)
Anion gap: 26 — ABNORMAL HIGH (ref 5–15)
BUN: 60 mg/dL — ABNORMAL HIGH (ref 8–23)
CO2: 30 mmol/L (ref 22–32)
Calcium: 9 mg/dL (ref 8.9–10.3)
Chloride: 82 mmol/L — ABNORMAL LOW (ref 98–111)
Creatinine, Ser: 2.64 mg/dL — ABNORMAL HIGH (ref 0.61–1.24)
GFR, Estimated: 25 mL/min — ABNORMAL LOW (ref >60)
Glucose, Bld: 109 mg/dL — ABNORMAL HIGH (ref 70–99)
Potassium: 4.6 mmol/L (ref 3.5–5.1)
Sodium: 138 mmol/L (ref 135–145)
Total Bilirubin: 2.6 mg/dL — ABNORMAL HIGH (ref <1.2)
Total Protein: 6.3 g/dL — ABNORMAL LOW (ref 6.5–8.1)

## 2023-06-24 LAB — LACTIC ACID, PLASMA
Lactic Acid, Venous: >9 mmol/L (ref 0.5–1.9)
Lactic Acid, Venous: >9 mmol/L (ref 0.5–1.9)
Lactic Acid, Venous: 8 mmol/L (ref 0.5–1.9)
Lactic Acid, Venous: 5.5 mmol/L (ref 0.5–1.9)

## 2023-06-24 LAB — GLUCOSE, CAPILLARY
Glucose-Capillary: 123 mg/dL — ABNORMAL HIGH (ref 70–99)
Glucose-Capillary: 114 mg/dL — ABNORMAL HIGH (ref 70–99)
Glucose-Capillary: 143 mg/dL — ABNORMAL HIGH (ref 70–99)
Glucose-Capillary: 125 mg/dL — ABNORMAL HIGH (ref 70–99)
Glucose-Capillary: 154 mg/dL — ABNORMAL HIGH (ref 70–99)
Glucose-Capillary: 134 mg/dL — ABNORMAL HIGH (ref 70–99)

## 2023-06-24 LAB — TROPONIN I (HIGH SENSITIVITY)
Troponin I (High Sensitivity): 22242 ng/L (ref <18)
Troponin I (High Sensitivity): >24000 ng/L (ref <18)

## 2023-06-24 LAB — PHOSPHORUS: Phosphorus: 6 mg/dL — ABNORMAL HIGH (ref 2.5–4.6)

## 2023-06-24 LAB — APTT
aPTT: 68 s — ABNORMAL HIGH (ref 24–36)
aPTT: 77 s — ABNORMAL HIGH (ref 24–36)

## 2023-06-24 LAB — SURGICAL PCR SCREEN
MRSA, PCR: NEGATIVE
Staphylococcus aureus: NEGATIVE

## 2023-06-24 LAB — PROCALCITONIN: Procalcitonin: 0.31 ng/mL

## 2023-06-24 LAB — BRAIN NATRIURETIC PEPTIDE: B Natriuretic Peptide: >4500 pg/mL — ABNORMAL HIGH (ref 0.0–100.0)

## 2023-06-24 LAB — MAGNESIUM: Magnesium: 2.7 mg/dL — ABNORMAL HIGH (ref 1.7–2.4)

## 2023-06-24 MED ORDER — VANCOMYCIN HCL 750 MG/150ML IV SOLN
750.0000 mg | Freq: Two times a day (BID) | INTRAVENOUS | Status: DC
  Filled 2023-06-24: qty 150

## 2023-06-24 MED ORDER — ORAL CARE MOUTH RINSE
15.0000 mL | OROMUCOSAL | Status: DC
  Administered 2023-06-24 (×4): 15 mL via OROMUCOSAL

## 2023-06-24 MED ORDER — SIMVASTATIN 20 MG PO TABS: 20.0000 mg | ORAL_TABLET | Freq: Every day | ORAL | Status: DC

## 2023-06-24 MED ORDER — ASPIRIN 81 MG PO CHEW
81.0000 mg | CHEWABLE_TABLET | Freq: Once | ORAL | Status: AC
  Administered 2023-06-24: 81 mg via NASOGASTRIC
  Filled 2023-06-24: qty 1

## 2023-06-24 MED ORDER — FENTANYL CITRATE PF 50 MCG/ML IJ SOSY: 25.0000 ug | PREFILLED_SYRINGE | Freq: Once | INTRAMUSCULAR | Status: DC

## 2023-06-24 MED ORDER — FENTANYL BOLUS VIA INFUSION
25.0000 ug | INTRAVENOUS | Status: DC | PRN
Start: 2023-06-24 — End: 2023-07-01
  Administered 2023-06-24 – 2023-06-25 (×9): 50 ug via INTRAVENOUS

## 2023-06-24 MED ORDER — NOREPINEPHRINE 4 MG/250ML-% IV SOLN
2.0000 ug/min | INTRAVENOUS | Status: DC
  Administered 2023-06-24: 7 ug/min via INTRAVENOUS
  Administered 2023-06-24: 2 ug/min via INTRAVENOUS
  Administered 2023-06-25: 7 ug/min via INTRAVENOUS
  Administered 2023-06-25: 8 ug/min via INTRAVENOUS
  Administered 2023-06-25 – 2023-06-26 (×2): 9 ug/min via INTRAVENOUS
  Administered 2023-06-26: 8 ug/min via INTRAVENOUS
  Filled 2023-06-24 (×7): qty 250

## 2023-06-24 MED ORDER — ORAL CARE MOUTH RINSE
15.0000 mL | OROMUCOSAL | Status: DC | PRN
Start: 2023-06-24 — End: 2023-06-25

## 2023-06-24 MED ORDER — ROCURONIUM BROMIDE 10 MG/ML (PF) SYRINGE: 50.0000 mg | PREFILLED_SYRINGE | Freq: Once | INTRAVENOUS | Status: AC

## 2023-06-24 MED ORDER — ORAL CARE MOUTH RINSE
15.0000 mL | OROMUCOSAL | Status: DC
  Administered 2023-06-24 – 2023-06-25 (×4): 15 mL via OROMUCOSAL

## 2023-06-24 MED ORDER — CLOPIDOGREL BISULFATE 75 MG PO TABS
75.0000 mg | ORAL_TABLET | Freq: Every day | ORAL | Status: DC
  Administered 2023-06-25 – 2023-07-01 (×7): 75 mg via NASOGASTRIC
  Filled 2023-06-24 (×7): qty 1

## 2023-06-24 MED ORDER — LACTULOSE ENEMA
300.0000 mL | Freq: Four times a day (QID) | ORAL | Status: DC
  Filled 2023-06-24 (×7): qty 300

## 2023-06-24 MED ORDER — FENTANYL 2500MCG IN NS 250ML (10MCG/ML) PREMIX INFUSION
25.0000 ug/h | INTRAVENOUS | Status: DC
  Administered 2023-06-24: 25 ug/h via INTRAVENOUS
  Administered 2023-06-25: 200 ug/h via INTRAVENOUS
  Administered 2023-06-26: 175 ug/h via INTRAVENOUS
  Filled 2023-06-24 (×4): qty 250

## 2023-06-24 MED ORDER — SODIUM CHLORIDE 0.9 % IV SOLN
0.0500 mg/kg/h | INTRAVENOUS | Status: DC
  Administered 2023-06-24 – 2023-06-26 (×4): 0.05 mg/kg/h via INTRAVENOUS
  Filled 2023-06-24 (×2): qty 250

## 2023-06-24 MED ORDER — FENTANYL CITRATE PF 50 MCG/ML IJ SOSY
25.0000 ug | PREFILLED_SYRINGE | INTRAMUSCULAR | Status: DC | PRN
  Administered 2023-06-24 (×2): 100 ug via INTRAVENOUS
  Administered 2023-06-29 (×2): 50 ug via INTRAVENOUS
  Administered 2023-06-30: 100 ug via INTRAVENOUS
  Administered 2023-06-30 (×2): 50 ug via INTRAVENOUS
  Administered 2023-07-01: 100 ug via INTRAVENOUS
  Filled 2023-06-24: qty 1
  Filled 2023-06-24 (×2): qty 2
  Filled 2023-06-24 (×2): qty 1
  Filled 2023-06-24: qty 2
  Filled 2023-06-24 (×2): qty 1
  Filled 2023-06-24: qty 2

## 2023-06-24 MED ORDER — ORAL CARE MOUTH RINSE: 15.0000 mL | OROMUCOSAL | Status: DC | PRN

## 2023-06-24 MED ORDER — SODIUM CHLORIDE 0.9 % IV SOLN
250.0000 mL | INTRAVENOUS | Status: DC
  Administered 2023-06-24: 250 mL via INTRAVENOUS

## 2023-06-24 MED ORDER — MIDODRINE HCL 5 MG PO TABS
5.0000 mg | ORAL_TABLET | Freq: Three times a day (TID) | ORAL | Status: DC
  Administered 2023-06-24 – 2023-06-26 (×6): 5 mg via ORAL
  Filled 2023-06-24 (×7): qty 1

## 2023-06-24 MED ORDER — CHLORHEXIDINE GLUCONATE CLOTH 2 % EX PADS
6.0000 | MEDICATED_PAD | Freq: Every day | CUTANEOUS | Status: DC
  Administered 2023-06-24 – 2023-07-01 (×8): 6 via TOPICAL

## 2023-06-24 MED ORDER — ROCURONIUM BROMIDE 10 MG/ML (PF) SYRINGE
PREFILLED_SYRINGE | INTRAVENOUS | Status: AC
  Administered 2023-06-24: 50 mg via INTRAVENOUS
  Filled 2023-06-24: qty 10

## 2023-06-24 MED ORDER — LACTULOSE 10 GM/15ML PO SOLN
30.0000 g | ORAL | Status: DC
  Administered 2023-06-24 – 2023-06-25 (×5): 30 g via ORAL
  Filled 2023-06-24 (×5): qty 45

## 2023-06-24 MED ORDER — SODIUM CHLORIDE 0.9 % IV SOLN
2.0000 g | Freq: Three times a day (TID) | INTRAVENOUS | Status: DC
  Administered 2023-06-24 (×2): 2 g via INTRAVENOUS
  Filled 2023-06-24 (×2): qty 12.5

## 2023-06-24 MED ORDER — FENTANYL CITRATE PF 50 MCG/ML IJ SOSY: 25.0000 ug | PREFILLED_SYRINGE | INTRAMUSCULAR | Status: DC | PRN

## 2023-06-24 MED ORDER — DEXTROSE 50 % IV SOLN
1.0000 | Freq: Once | INTRAVENOUS | Status: AC
  Administered 2023-06-24: 50 mL via INTRAVENOUS

## 2023-06-24 MED ORDER — VANCOMYCIN HCL 2000 MG/400ML IV SOLN
2000.0000 mg | Freq: Once | INTRAVENOUS | Status: AC
Start: 2023-06-24 — End: 2023-06-24
  Administered 2023-06-24: 2000 mg via INTRAVENOUS
  Filled 2023-06-24: qty 400

## 2023-06-24 MED ORDER — SODIUM CHLORIDE 0.9 % IV SOLN
2.0000 g | Freq: Two times a day (BID) | INTRAVENOUS | Status: DC
  Administered 2023-06-24 – 2023-06-27 (×7): 2 g via INTRAVENOUS
  Filled 2023-06-24 (×7): qty 12.5

## 2023-06-24 MED ORDER — SODIUM CHLORIDE 0.9 % IV BOLUS
500.0000 mL | Freq: Once | INTRAVENOUS | Status: AC
  Administered 2023-06-24: 500 mL via INTRAVENOUS

## 2023-06-24 MED ORDER — ETOMIDATE 2 MG/ML IV SOLN: 20.0000 mg | Freq: Once | INTRAVENOUS | Status: AC

## 2023-06-24 MED ORDER — CLOPIDOGREL BISULFATE 300 MG PO TABS
300.0000 mg | ORAL_TABLET | Freq: Every day | ORAL | Status: DC
  Administered 2023-06-24: 300 mg via NASOGASTRIC
  Filled 2023-06-24: qty 1

## 2023-06-24 MED ORDER — DOCUSATE SODIUM 50 MG/5ML PO LIQD
100.0000 mg | Freq: Two times a day (BID) | ORAL | Status: DC
Start: 2023-06-24 — End: 2023-06-26
  Administered 2023-06-24 – 2023-06-25 (×2): 100 mg
  Filled 2023-06-24 (×2): qty 10

## 2023-06-24 MED ORDER — SODIUM BICARBONATE 8.4 % IV SOLN
25.0000 meq | Freq: Once | INTRAVENOUS | Status: AC
  Administered 2023-06-24: 25 meq via INTRAVENOUS

## 2023-06-24 MED ORDER — METRONIDAZOLE 500 MG/100ML IV SOLN
500.0000 mg | Freq: Two times a day (BID) | INTRAVENOUS | Status: DC
  Administered 2023-06-24 – 2023-06-25 (×3): 500 mg via INTRAVENOUS
  Filled 2023-06-24 (×3): qty 100

## 2023-06-24 MED ORDER — POLYETHYLENE GLYCOL 3350 17 G PO PACK
17.0000 g | PACK | Freq: Every day | ORAL | Status: DC
  Administered 2023-06-26 – 2023-07-01 (×6): 17 g
  Filled 2023-06-24 (×6): qty 1

## 2023-06-24 MED ORDER — DEXTROSE 50 % IV SOLN
INTRAVENOUS | Status: AC
  Filled 2023-06-24: qty 50

## 2023-06-24 MED ORDER — ETOMIDATE 2 MG/ML IV SOLN
INTRAVENOUS | Status: AC
  Administered 2023-06-24: 20 mg via INTRAVENOUS
  Filled 2023-06-24: qty 20

## 2023-06-24 MED ORDER — LACTATED RINGERS IV BOLUS
500.0000 mL | Freq: Once | INTRAVENOUS | Status: AC
  Administered 2023-06-24: 500 mL via INTRAVENOUS

## 2023-06-24 MED ORDER — VANCOMYCIN VARIABLE DOSE PER UNSTABLE RENAL FUNCTION (PHARMACIST DOSING)
Status: DC
Start: 2023-06-24 — End: 2023-06-25

## 2023-06-24 MED ORDER — ZINC OXIDE 40 % EX OINT
TOPICAL_OINTMENT | Freq: Two times a day (BID) | CUTANEOUS | Status: DC
  Administered 2023-06-29 – 2023-06-30 (×2): 1 via TOPICAL
  Filled 2023-06-24: qty 57

## 2023-06-24 NOTE — Procedures (Signed)
Central Venous Catheter Insertion Procedure Note  [name]  [id number]  06-18-1956  Date:06/24/23  Time:1:30 PM   Provider Performing:Maud Rubendall D. Harris   Procedure: Insertion of Non-tunneled Central Venous [phone]) with US guidance (56213)   Indication(s) Medication administration  Consent Risks of the procedure as well as the alternatives and risks of each were explained to the patient and/or caregiver.  Consent for the procedure was obtained and is signed in the bedside chart  Anesthesia Topical only with 1% lidocaine   Timeout Verified patient identification, verified procedure, site/side was marked, verified correct patient position, special equipment/implants available, medications/allergies/relevant history reviewed, required imaging and test results available.  Sterile Technique Maximal sterile technique including full sterile barrier drape, hand hygiene, sterile gown, sterile gloves, mask, hair covering, sterile ultrasound probe cover (if used).  Procedure Description Area of catheter insertion was cleaned with chlorhexidine and draped in sterile fashion.  With real-time ultrasound guidance a central venous catheter was placed into the right internal jugular vein. Nonpulsatile blood flow and easy flushing noted in all ports.  The catheter was sutured in place and sterile dressing applied.  Complications/Tolerance None; patient tolerated the procedure well. Chest X-ray is ordered to verify placement for internal jugular or subclavian cannulation.   Chest x-ray is not ordered for femoral cannulation.  EBL Minimal  Specimen(s) None  [name] D. Harris, NP-C [facility] Personal contact information can be found on Amion  If no contact or response made please call [phone] 06/24/2023, 1:30 PM

## 2023-06-24 NOTE — Progress Notes (Signed)
Echocardiogram 2D Echocardiogram has been performed.  Dorota Heinrichs N Kelliann Pendergraph,RDCS 06/24/2023, 10:20 AM

## 2023-06-24 NOTE — Progress Notes (Signed)
Patient Name: [name] Date of Encounter: 06/24/2023 [facility] Cardiologist: Charlton Haws, MD   Interval Summary  .    Decompensated overnight.  Altered mental status, low-grade fever, marked tachycardia and hypotension and oxygen desaturation not responding to BiPAP.  Was intubated a couple of hours ago. On very low dose norepi IV. Had a large amount of UO (incontinent) early this AM on arrival to ICU, but none since.  Vital Signs .    Vitals:   06/24/23 0703 06/24/23 0747 06/24/23 0748 06/24/23 0842  BP:      Pulse:    89  Resp:   20 20  Temp:  99.2 F (37.3 C)    TempSrc:  Oral    SpO2:    100%  Weight: 113.9 kg     Height:        Intake/Output Summary (Last 24 hours) at 06/24/2023 0856 Last data filed at 06/24/2023 0600 Gross per 24 hour  Intake 1229.94 ml  Output 600 ml  Net 629.94 ml      06/24/2023    7:03 AM 06/23/2023    3:45 AM 06/21/2023    3:42 AM  Last 3 Weights  Weight (lbs) 251 lb 1.7 oz 251 lb 1.7 oz 252 lb 1.6 oz  Weight (kg) 113.9 kg 113.9 kg 114.352 kg      Telemetry/ECG    Atrial flutter with variable AV block- Personally Reviewed  Physical Exam .   GEN: Sedated.  Morbidly obese.  Intubated. Neck: 9-10 cm JVD, prominent V waves, bilateral rhonchi Cardiac: irregular, 2/6 holosystolic murmur, rubs, or gallops.  Respiratory: rhonchi bilaterally. GI: Soft, nontender, non-distended  MS: wrapped bilateral calves, thighs with minimal residual edema  Assessment & Plan .     Septic shock: Abrupt deterioration and fever point to this is the most likely cause for the sudden collapse.  Of course, background severe pulmonary artery hypertension and RV dysfunction make him very vulnerable to hemodynamic failure.  Yesterday he had a rather wet sounding cough and I wonder if he had difficulty managing secretions and may have had aspiration.  He is receiving broad-spectrum antibiotics. Acute on chronic hypercapnic respiratory  failure: documented as far back as 2009.  Suspect he may nowhave a chronic CO2 around 55-60. AKI: As part of the shock syndrome.  Noted abrupt worsening of renal parameters which had otherwise been gradually improving with diuresis. Last UO around 6AM. Will need a bladder scan +/- Foley catheter if no UO soon. Chronic cor pulmonale: Due to COPD and OSA noncompliant with CPAP, very likely also component of obesity hypoventilation syndrome. ABG with severe hypercapnia, compensatory metabolic alkalosis. Atrial flutter: rate controlled. History of HIT History of CAD: He did not have any complaints of chest pain, but he had altered mental status.  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  The ST-T wave changes were worse compared to his baseline EKG from 06/18/2023 on 5AM ECG, but are back to baseline on 8AM ECG.  Will check cardiac enzymes.  Repeat limited echo.  I would not be surprised if his troponin is mildly or moderately elevated from demand myocardial injury.  He was chronically on aspirin and clopidogrel prior to this admission, those medications were stopped when he was started on Eliquis for atrial flutter.  We can resume clopidogrel as long as there is no bleeding problem. Iron deficiency anemia: Low hemoglobin precedes  this admission with hemoglobin around 9-10 throughout this year.  Considering his chronic lung problems he should be polycythemic and for him this probably qualifies as severe anemia.  Labs performed earlier this hospitalization are consistent with iron deficiency.  No overt bleeding.  CRITICAL CARE Performed by: Rachelle Hora Luisenrique Conran   Total critical care time: 60 minutes  Critical care time was exclusive of separately billable procedures and treating other patients.  Critical care was necessary to treat or prevent imminent or life-threatening deterioration.  Critical care was  time spent personally by me on the following activities: development of treatment plan with patient and/or surrogate as well as nursing, discussions with consultants, evaluation of patient's response to treatment, examination of patient, obtaining history from patient or surrogate, ordering and performing treatments and interventions, ordering and review of laboratory studies, ordering and review of radiographic studies, pulse oximetry and re-evaluation of patient's condition.   For questions or updates, please contact [facility] Please consult [email or web address] for contact info under        Signed, Thurmon Fair, MD

## 2023-06-24 NOTE — Plan of Care (Signed)
Patient has been less responsive than reported by dayshift nurse. He was no arousable to take HS meds, and RT was able to put pt on cpap especially since the report said that the MD wanted the patient to sleep with it tonight.  Midnight vitals showed temp of 100.4, and BP had dropped to 79/61 (68) R and 89/55 (66) L. Pt continued to be in ST. MD was notified, Dr. Lazarus Salines and he came to the room, ordered blood cultures, ABX, chest xray, abg and other labs.  Patient responded to the MD sternal rub, and was monitoring patient on the floor.  Patient received IV ABX after blood cultures were drawn but respiratory needs increased. Sats started to drop to 91% with the cpap on 4 L and increased WOB. RT increased O2 to bipap 6/10 on 8 L but continued to have increased WOB. MD called back to the room with Rapid response RN and patient had a LR 500 ml IV bolus. PCCM was consulted as well and came to the room and gave an order for an amp of D50. Patient only had 100 ml of urine output by this time of the shift, and on transfer to the ICU had a large bowel movement, was getting lactulose and had an oral dose around 1800 yesterday. Spoke to wife once patient was transferred and she is on the way to the ICU.   Problem: Education: Goal: Knowledge of General Education information will improve Description: Including pain rating scale, medication(s)/side effects and non-pharmacologic comfort measures 06/24/2023 0611 by Rockney Ghee, RN Outcome: Not Progressing 06/23/2023 2314 by Rockney Ghee, RN Outcome: Progressing   Problem: Health Behavior/Discharge Planning: Goal: Ability to manage health-related needs will improve 06/24/2023 0611 by Rockney Ghee, RN Outcome: Not Progressing 06/23/2023 2314 by Rockney Ghee, RN Outcome: Progressing   Problem: Clinical Measurements: Goal: Ability to maintain clinical measurements within normal limits will improve 06/24/2023 0611 by Rockney Ghee, RN Outcome: Not Progressing 06/23/2023 2314 by Rockney Ghee, RN Outcome: Progressing Goal: Will remain free from infection 06/24/2023 0611 by Rockney Ghee, RN Outcome: Not Progressing 06/23/2023 2314 by Rockney Ghee, RN Outcome: Progressing Goal: Diagnostic test results will improve 06/24/2023 0611 by Rockney Ghee, RN Outcome: Not Progressing 06/23/2023 2314 by Rockney Ghee, RN Outcome: Progressing Goal: Respiratory complications will improve 06/24/2023 0611 by Rockney Ghee, RN Outcome: Not Progressing 06/23/2023 2314 by Rockney Ghee, RN Outcome: Progressing Goal: Cardiovascular complication will be avoided 06/24/2023 0611 by Rockney Ghee, RN Outcome: Not Progressing 06/23/2023 2314 by Rockney Ghee, RN Outcome: Progressing   Problem: Activity: Goal: Risk for activity intolerance will decrease 06/24/2023 0611 by Rockney Ghee, RN Outcome: Not Progressing 06/23/2023 2314 by Rockney Ghee, RN Outcome: Progressing   Problem: Nutrition: Goal: Adequate nutrition will be maintained 06/24/2023 0611 by Rockney Ghee, RN Outcome: Not Progressing 06/23/2023 2314 by Rockney Ghee, RN Outcome: Progressing   Problem: Coping: Goal: Level of anxiety will decrease 06/24/2023 0611 by Rockney Ghee, RN Outcome: Not Progressing 06/23/2023 2314 by Rockney Ghee, RN Outcome: Progressing   Problem: Elimination: Goal: Will not experience complications related to bowel motility 06/24/2023 0611 by Rockney Ghee, RN Outcome: Not Progressing 06/23/2023 2314 by Rockney Ghee, RN Outcome: Progressing Goal: Will not experience complications related to urinary retention 06/24/2023 0611 by Rockney Ghee, RN Outcome: Not Progressing 06/23/2023 2314 by Rockney Ghee, RN Outcome: Progressing   Problem: Pain  Management: Goal: General  experience of comfort will improve 06/24/2023 0611 by Rockney Ghee, RN Outcome: Not Progressing 06/23/2023 2314 by Rockney Ghee, RN Outcome: Progressing   Problem: Safety: Goal: Ability to remain free from injury will improve 06/24/2023 0611 by Aayushi Solorzano, Levonne Lapping, RN Outcome: Not Progressing 06/23/2023 2314 by Rockney Ghee, RN Outcome: Progressing   Problem: Skin Integrity: Goal: Risk for impaired skin integrity will decrease 06/24/2023 0611 by Rockney Ghee, RN Outcome: Not Progressing 06/23/2023 2314 by Rockney Ghee, RN Outcome: Progressing

## 2023-06-24 NOTE — Consult Note (Addendum)
WOC Nurse Consult Note: [facility]; last seen 06/18/2023 with debridement of necrotic tissue and compression wrap placed; due to follow-up this Wednesday; wife wanted dressings removed and legs assessed  Reason for Consult: DTI sacrum/BLE wounds  Wound type: 1. Deep Tissue Pressure Injury B medial buttocks and R lower buttock  2.  Full thickness ulcers B lower legs d/t venous insufficiency  Pressure Injury POA: no  Measurement: 1.  R medial buttock DTPI 3 cm x 1 cm; L medial buttock DTPI 3 cm x 1 cm; R lower buttock DTPI 3 cm x 3 cm all purple maroon discoloration skin intact  2.  R lateral lower full thickness 3 cm x 4 cm x 0.1 cm; R pretibial 1 cm x 1 cm  3.  L medial lower leg 5 cm x 9 cm 100% red moist; L lower near ankle 7 cm x 15 cm 100% red moist  Wound bed: as above  Drainage (amount, consistency, odor) minimal serosanguinous to B lower leg wounds  Periwound: erythema and moisture associated skin damage to buttocks; legs with edema  Dressing procedure/placement/frequency:  Clean bilateral buttocks with soap and water, dry and apply Xeroform gauze [place] [phone]) daily to areas of purple maroon discoloration.May cover with silicone foam or ABD pad. Change silicone foam q3 days and prn soiling.  Apply a thin layer of Desitin to buttocks/sacrum 2 times daily and prn soiling.  Clean B leg ulcers with Vashe wound cleanser [facility] [place] [phone]), cover wound beds with silver hydrofiber (Aquacel [place] [phone]) every other day.  Cover anterior and posterior legs with ABD pads.  Wrap legs with Kerlix roll gauze wrapped beginning just above toes and ending right below knees.  Secure dressing with Ace bandage wrapped in same fashion as Kerlix roll gauze for light compression.  SOAK SILVER WITH NS IF STUCK TO WOUND BEDS FOR ATRAUMATIC REMOVAL.   Bedside nurse assisted with this consult, greatly appreciated.  POC discussed with bedside nurse. WOC team will follow every 7-10  days for assessment of buttock DTPI.   Thank you,    Priscella Mann MSN, RN-BC, [employer] [phone]

## 2023-06-24 NOTE — Progress Notes (Addendum)
PHARMACY - ANTICOAGULATION CONSULT NOTE  Pharmacy Consult for bivalirudin Indication:  atrial flutter  Allergies  Allergen Reactions   Bendamustine Hcl     See hospital notes from 01/12/15, legs turned black   Heparin Other (See Comments)    Heparin induced thrombocytopenia    Bendamustine Hcl Rash    Severe   Tape Rash    If left on for prolong periods of time     Patient Measurements: Height: 6' (182.9 cm) Weight: 113.9 kg (251 lb 1.7 oz) IBW/kg (Calculated) : 77.6 Heparin Dosing Weight: 113.9 kg  Vital Signs: Temp: 99.3 F (37.4 C) (11/10 1545) Temp Source: Oral (11/10 1545) BP: 101/57 (11/10 1630) Pulse Rate: 98 (11/10 1630)  Labs: Recent Labs    06/23/23 0227 06/24/23 0301 06/24/23 0551 06/24/23 0556 06/24/23 0840 06/24/23 1011 06/24/23 1159 06/24/23 1645  HGB 7.9* 8.3*  --  8.0* 9.5*  --   --   --   HCT 28.0* 30.2*  --  28.7* 28.0*  --   --   --   PLT 141* 186  --  159  --   --   --   --   APTT  --   --   --   --   --   --   --  68*  CREATININE 1.27* 2.46* 2.64*  --   --   --  2.47*  --   TROPONINIHS  --   --   --   --   --  22,242* >24,000*  --     Estimated Creatinine Clearance: 36.8 mL/min (A) (by C-G formula based on SCr of 2.47 mg/dL (H)).   Medical History: Past Medical History:  Diagnosis Date   Anxiety    Basal cell carcinoma of nose    Blood dyscrasia    trouble clotting    Carotid artery disease (HCC)    a. s/p L CEA 2009 (hx of evacuation of hematoma due to heparin); b. 01/2022 Carotid U/S: 1-39% bilat stenosis.   Chronic heart failure with preserved ejection fraction (HFpEF) (HCC)    a. 04/2022 Echo: EF 60-65%, no rwma, GrII DD, nl RV fxn, RVSP 47.16mmHg. Mild BAE. Mild MR. Mild-mod TR.   Chronic lower back pain    CKD (chronic kidney disease), stage III (HCC)    COPD (chronic obstructive pulmonary disease) (HCC)    Coronary artery disease    a. 2005 s/p CABG x 3 (LIMA->LAD, VG->OM1, VG->dRCA); b. 09/2011 s/p PCI/DES of the VG->OM1 and  VG->dRCA; c. 11/2017 PCI: LM 50ost, LAD 90ost/p, OM1 100 CTO, RCA 100p CTO, VG->dRCA 99 ISR (4.0x22 Resolute Onyx DES), VG->OM1 100 CTO/ISR, LIMA->LAD patent.   Depression    Diabetes mellitus without complication (HCC)    borderline    Dysrhythmia    fluttering   Edema    GERD (gastroesophageal reflux disease)    Heart murmur    Heparin induced thrombocytopenia (HCC)    History of home oxygen therapy    2 liters at night prn   Hyperlipidemia    Hypertension    Lymphoma (HCC) 12/21/2014   Myocardial infarction (HCC)    Normocytic anemia    Obesity    Sleep apnea    a. Not using CPAP.    Medications:  Medications Prior to Admission  Medication Sig Dispense Refill Last Dose   albuterol (PROVENTIL) (2.5 MG/3ML) 0.083% nebulizer solution INHALE 1 VIAL VIA NEBULIZER EVERY DAY AS NEEDED FOR WHEEZING/SHORTNESS OF BREATH 75 mL 5 Past Month  aspirin EC 81 MG tablet Take 81 mg by mouth every evening.    06/17/2023   aspirin-sod bicarb-citric acid (ALKA-SELTZER) 325 MG TBEF tablet Take 650 mg by mouth every 6 (six) hours as needed (indigestion).   Past Week   clonazePAM (KLONOPIN) 1 MG tablet Take 1 tablet (1 mg total) by mouth at bedtime. 30 tablet 5 06/17/2023   clopidogrel (PLAVIX) 75 MG tablet TAKE 1 TABLET BY MOUTH EVERY DAY WITH BREAKFAST 90 tablet 3 06/18/2023 at AM   metolazone (ZAROXOLYN) 5 MG tablet Take 1 tablet (5 mg total) by mouth daily as needed (swelling). 90 tablet 1 06/17/2023   metoprolol succinate (TOPROL-XL) 25 MG 24 hr tablet Take 0.5 tablets (12.5 mg total) by mouth daily. Take with or immediately following a meal.   06/17/2023   nitroGLYCERIN (NITROSTAT) 0.4 MG SL tablet Place 1 tablet (0.4 mg total) under the tongue every 5 (five) minutes as needed for chest pain (Up to 3 doses). 25 tablet 1 unknown   oxyCODONE-acetaminophen (PERCOCET) 7.5-325 MG tablet Take 1 tablet by mouth every 6 (six) hours as needed for severe pain. 120 tablet 0 06/18/2023   oxymetazoline (AFRIN) 0.05  % nasal spray Place 1 spray into both nostrils 2 (two) times daily as needed for congestion.   unknown   simvastatin (ZOCOR) 20 MG tablet Take 1 tablet (20 mg total) by mouth at bedtime. 90 tablet 3 06/17/2023   torsemide (DEMADEX) 20 MG tablet Take 20 mg by mouth daily.   06/17/2023   senna-docusate (SENOKOT-S) 8.6-50 MG tablet Take 1-2 tablets by mouth at bedtime. (Patient not taking: Reported on 06/18/2023) 100 tablet 6 Not Taking   Scheduled:   Chlorhexidine Gluconate Cloth  6 each Topical Daily   [START ON 06/25/2023] clopidogrel  75 mg Per NG tube Daily   docusate  100 mg Per Tube BID   fentaNYL (SUBLIMAZE) injection  25 mcg Intravenous Once   lactulose  30 g Oral Q4H   lactulose  300 mL Rectal QID   midodrine  5 mg Oral TID WC   mouth rinse  15 mL Mouth Rinse 4 times per day   mouth rinse  15 mL Mouth Rinse Q2H   polyethylene glycol  17 g Per Tube Daily   simvastatin  20 mg Oral QHS   sodium chloride flush  3 mL Intravenous Q12H   vancomycin variable dose per unstable renal function (pharmacist dosing)   Does not apply See admin instructions   Infusions:   sodium chloride 10 mL/hr at 06/24/23 1600   bivalirudin (ANGIOMAX) 250 mg in sodium chloride 0.9 % 500 mL (0.5 mg/mL) infusion 0.05 mg/kg/hr (06/24/23 1600)   ceFEPime (MAXIPIME) IV     fentaNYL infusion INTRAVENOUS 50 mcg/hr (06/24/23 1600)   metronidazole Stopped (06/24/23 1448)   norepinephrine (LEVOPHED) Adult infusion 7 mcg/min (06/24/23 1600)   PRN: acetaminophen, fentaNYL, fentaNYL (SUBLIMAZE) injection, fentaNYL (SUBLIMAZE) injection, ondansetron (ZOFRAN) IV, mouth rinse, mouth rinse, senna-docusate, sodium chloride flush  Assessment: 25 YOM with PMH of follicular lymphoma in remission, RV failure, severe PAH, HTN, HLD, CAD, Afib/flutter, OSA, chronic lower extremity wounds. Returned to ICU 11/10 with worsening fever, hypotension consistent with septic shock. Patient is also persistently in either AF or Aflutter.  Previously patient was on Eliquis. Will start IV AC with bivalirudin. Pt most recent CBC is stable with Hgb 9.5 and platelet 159.  Initial aPTT came back therapeutic at 68, on bivalirudin@0 .05 mg/kg/hr. No s/sx of bleeding or infusion issues.   Goal  of Therapy:  aPTT 50-85 seconds Monitor platelets by anticoagulation protocol: Yes   Plan:  Continue bivalirudin at 0.05 mg/kg/hour Check 4 hour aPTT Monitor daily aPTT, CBC, and s/sx of bleeding/bruising.  Thank you for allowing pharmacy to participate in this patient's care,  Sherron Monday, PharmD, BCCCP Clinical Pharmacist  Phone: [phone] 06/24/2023 5:59 PM  Please check AMION for all [facility] Pharmacy phone numbers After 10:00 PM, call Main Pharmacy [phone]  ADDENDUM aPTT came back in goal range at 77, on bivalirudin@0 .05 mg/kg/hr. No s/sx of bleeding or infusion issues. Continue bivalirudin at same rate and get level with morning labs.   Thank you for allowing pharmacy to participate in this patient's care,  Sherron Monday, PharmD, BCCCP Clinical Pharmacist

## 2023-06-24 NOTE — Progress Notes (Addendum)
NAME:  [name], MRN:  [id number], DOB:  [birth year], LOS: 6 ADMISSION DATE:  06/18/2023, CONSULTATION DATE:  06/24/23 REFERRING MD:  Duard Larsen - TRH, CHIEF COMPLAINT:  AMS   History of Present Illness:  67 yo M PMH follicular lymphoma in remission, RV failure, severe PAH g3, HTN, HLD, CAD, Afib/flutter, OSA/OHS, obesity, venous insufficiency, chronic lower extremity wounds who was admitted 11/4 for lower extremity//groin swelling after presenting to his outpt wound care clinic.  It was identified taht pt was not takiing diuretic as Rx. Admitted to HF exacerbation w concomitant AKI, cards consulted and aggressively diuresed 11/4-11/8  On 11/9 the pt was more altered. Ammonia was sent and resulted elevated 129. Lactulose started. Over the course of 11/9-11/10 night shift pt was progressively more lethargic and started to have low BP as well as low-grade temp which was new. 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 Discovered that he wasn't taking diuretic as Rx. AKI, HF exacerbation. Aggressive diuresis started 11/5 - 11/8 ongoing diuresis.  11/9 night AMS, hyperammonemia started lactulose 11/10 early AM hypotension worse AMS. CCM consult, ICU transfer   Interim History / Subjective:  Mental status decline  + fever + hypotension overnight   BMP has resulted w worse renal fx CR up to 2.46 Ammonia 129 AG 24 LA > 9   Objective   Blood pressure (!) 82/56, pulse (!) 106, temperature (!) 100.4 F (38 C), temperature source Axillary, resp. rate 20, height 6' (1.829 m), weight 113.9 kg, SpO2  98%.    FiO2 (%):  [36 %] 36 %   Intake/Output Summary (Last 24 hours) at 06/24/2023 0514 Last data filed at 06/24/2023 0300 Gross per 24 hour  Intake 480 ml  Output 500 ml  Net -20 ml   Filed Weights   06/20/23 0402 06/21/23 0342 06/23/23 0345  Weight: 115.8 kg 114.4 kg 113.9 kg    Examination: General: chronically and critically ill elderly M on CPAP  HENT: CPAP in place. NCAT. Pink mm. Petechia on ear.  Lungs: Symmetrical chest expansion, NPPV sounds dominate lung fields  Cardiovascular: tachy, cap refill is 2 sec.  Abdomen: soft round. Some inconsistent guarding RUQ Extremities: BLE wrapped, edematous.  Neuro: Opens eyes and responds briefly to sternal rub or loud noise. Falls asleep quickly  GU: defer   Resolved Hospital Problem list     Assessment & Plan:   Acute encephalopathy -hypoperfusion, ?septic, HE, uremia possibly contributing as well. Borderline hypoglycemic. He is hypercarbic, but w metabolic compensation.  P -NPO - tx underlying processes as below  Acute on chronic hypercarbic resp failure Bilateral pleural effusions Pulmonary edema  COPD OSA/OHS  Prior tobacco use -- 74yr pack hx  P -agree w BiPAP for now instead of CPAP -at present is protecting airway, but may progress to needing intubation   Shock: concern for septic shock +/- cardiogenic -possible relative hypovolemia in setting of diuresis P -agree w broad abx -received 500 ml  -start periph NE for MAP >65 -BNP -follow cx data, CXR also ordered  -if he stabilizes, might be worth sending for CT a/p vs RUQ [place]  to r/o intraabd process. Abd exam pretty nonspecific, but he does have his appendix and GB as far as we know   -if pressor req increases and needs line, send a coox   Decompensated RV failure / diastolic HF PAH (group 3)  Cardiogenic volume overload  Aflutter CAD s/p CABG x3, PCI  HLD Hx HIT  -sounds like some med noncompliance at home -- pt reported to some providers that he  does not take diuretic daily bc makes him pee all the time. Wife under impression that he is entirely compliant.  P -for now, defer add'l diuresis while we stabilize BP -NE as above -dc antihypertensives  - zocor, eliquis-- have left these ordered PO in hopes that mentation improves. If hasn't, then probably will need ETT and OGT.  -cards following, consider Adv HF consult in AM    AKI  Hypermagnesemia Hyperphosphatemia  -renal fxn had been improving, now worse after developing shock  P -defer add'l diuresis  -ensure renal perfusion, MAP > 65   Hyperammonemia Hepatic cirrhosis  -worry this is reflective of hepatic dysfunction. Wife denies reg etoh use, so would think might be congestive hepatopathy 2/2HF  -CT a/p 05/15/22 with cirrhosis  P -lactulose enema -LFTs pending -consider RUQ [place]   Hypoglycemia -worry that this might be reflective of hepatic dysfunction. Could be decr intake  P -amp of d50 now and recheck, then follow q4    BLE wounds Lymphedema Venous insufficiency  -follows w outpt wound care, most recent debridement 11/4 -w overnight decompensation, now covering for possible cellulitis    Lactic acidosis  AGMA -in setting of above processes -trend -tx underlying contributors as above   Hx follicular lymphoma  -outpt f/u. No active dz   Unintentional weight loss Physical deconditioning Frequent falls Tremors, ataxia  -?r/t his heart failure.  -Currently no active dz re lymphoma  -PT/OT/ RDN this admission   GOC Code status discussion -I spoke w pt wife re his decompensation tonight which she was very shocked to learn of -we talked about GOC/code status. Initially she said they have talked about this before and that Gala Romney would NOT want to be on a vent, resuscitated. In clarifying what this means (ie max resp support would be BiPAP, and DNR if arrests) she said they haven't ever talked about it and to do everything. When I tried to clarify this she said  she isn't sure if they have talked about it or not.. -I am maintaining pt as Full code at this time    Best Practice (right click and "Reselect all SmartList Selections" daily)   Diet/type: NPO DVT prophylaxis: DOAC GI prophylaxis: N/A Lines: N/A Foley:  N/A Code Status:  full code Last date of multidisciplinary goals of care discussion [11/10 ]  Labs   CBC: Recent Labs  Lab 06/18/23 1213 06/21/23 0500 06/23/23 0227 06/24/23 0301  WBC 7.6 7.4 7.5 10.6*  NEUTROABS  --  4.9  --   --   HGB 9.2* 8.2* 7.9* 8.3*  HCT 31.3* 28.4* 28.0* 30.2*  MCV 96.9 99.3 101.4* 102.0*  PLT 136* 126* 141* 186    Basic Metabolic Panel: Recent Labs  Lab 06/20/23 0315 06/21/23 0500 06/22/23 0330 06/23/23 0227 06/24/23 0301  NA 140 139 140 141 142  K 3.4* 3.9 3.7 4.2 4.9  CL 83* 81* 82* 82* 82*  CO2 >45* >45* >45* 44* 36*  GLUCOSE 108* 116* 122* 108* 61*  BUN 35* 33* 33* 34* 57*  CREATININE 1.58* 1.28*  1.38* 1.27* 2.46*  CALCIUM 8.4* 8.4* 8.7* 9.1 9.1  MG 2.1  --   --   --  2.7*  PHOS  --   --   --   --  6.0*   GFR: Estimated Creatinine Clearance: 36.9 mL/min (A) (by C-G formula based on SCr of 2.46 mg/dL (H)). Recent Labs  Lab 06/18/23 1213 06/21/23 0500 06/23/23 0227 06/24/23 0301 06/24/23 0322  WBC 7.6 7.4 7.5 10.6*  --   LATICACIDVEN  --   --   --   --  >9.0*    Liver Function Tests: Recent Labs  Lab 06/18/23 1351  AST 26  ALT 15  ALKPHOS 81  BILITOT 1.1  PROT 6.8  ALBUMIN 3.5   No results for input(s): "LIPASE", "AMYLASE" in the last 168 hours. Recent Labs  Lab 06/23/23 1644  AMMONIA 129*    ABG    Component Value Date/Time   PHART 7.38 06/24/2023 0131   PCO2ART 74 (HH) 06/24/2023 0131   PO2ART 92 06/24/2023 0131   HCO3 43.8 (H) 06/24/2023 0221   TCO2 35 (H) 12/07/2017 0859   O2SAT 82.7 06/24/2023 0221     Coagulation Profile: No results for input(s): "INR", "PROTIME" in the last 168 hours.  Cardiac Enzymes: No results for input(s): "CKTOTAL",  "CKMB", "CKMBINDEX", "TROPONINI" in the last 168 hours.  HbA1C: Hgb A1c MFr Bld  Date/Time Value Ref Range Status  02/21/2023 12:01 PM 5.7 4.6 - 6.5 % Final    Comment:    Glycemic Control Guidelines for People with Diabetes:Non Diabetic:  <6%Goal of Therapy: <7%Additional Action Suggested:  >8%   09/26/2021 10:12 AM 5.2 4.6 - 6.5 % Final    Comment:    Glycemic Control Guidelines for People with Diabetes:Non Diabetic:  <6%Goal of Therapy: <7%Additional Action Suggested:  >8%     CBG: No results for input(s): "GLUCAP" in the last 168 hours.  Review of Systems:   Unable to obtain due to encephalopathy   Past Medical History:  He,  has a past medical history of Anxiety, Basal cell carcinoma of nose, Blood dyscrasia, Carotid artery disease (HCC), Chronic heart failure with preserved ejection fraction (HFpEF) (HCC), Chronic lower back pain, CKD (chronic kidney disease), stage III (HCC), COPD (chronic obstructive pulmonary disease) (HCC), Coronary artery disease, Depression, Diabetes mellitus without complication (HCC), Dysrhythmia, Edema, GERD (gastroesophageal reflux disease), Heart murmur, Heparin induced thrombocytopenia (HCC), History of home oxygen therapy, Hyperlipidemia, Hypertension, Lymphoma (HCC) (12/21/2014), Myocardial infarction (HCC), Normocytic anemia, Obesity, and Sleep apnea.   Surgical History:   Past Surgical History:  Procedure Laterality Date   BASAL CELL CARCINOMA EXCISION  2000's   nose   BIOPSY  08/22/2022   Procedure: BIOPSY;  Surgeon: Jeani Hawking, MD;  Location: [facility] ENDOSCOPY;  Service: Gastroenterology;;   BRONCHIAL NEEDLE ASPIRATION BIOPSY  07/24/2022   Procedure: BRONCHIAL NEEDLE ASPIRATION BIOPSIES;  Surgeon: Leslye Peer, MD;  Location: [facility] ENDOSCOPY;  Service: Cardiopulmonary;;   CARDIAC CATHETERIZATION  12/07/2017   CAROTID ENDARTERECTOMY  03/2008   left   COLONOSCOPY WITH PROPOFOL N/A 08/22/2022   Procedure: COLONOSCOPY WITH PROPOFOL;  Surgeon: Jeani Hawking, MD;  Location: [facility] ENDOSCOPY;  Service: Gastroenterology;  Laterality: N/A;   CORONARY ANGIOPLASTY WITH STENT PLACEMENT  09/29/11   "2"   CORONARY ARTERY BYPASS GRAFT  2005   CABG X3   CORONARY STENT INTERVENTION  12/07/2017   CORONARY STENT INTERVENTION N/A 12/07/2017   Procedure: CORONARY STENT INTERVENTION;  Surgeon: Kathleene Hazel, MD;  Location: [facility]  INVASIVE CV LAB;  Service: Cardiovascular;  Laterality: N/A;   ESOPHAGOGASTRODUODENOSCOPY (EGD) WITH PROPOFOL N/A 08/22/2022   Procedure: ESOPHAGOGASTRODUODENOSCOPY (EGD) WITH PROPOFOL;  Surgeon: Jeani Hawking, MD;  Location: [facility] ENDOSCOPY;  Service: Gastroenterology;  Laterality: N/A;   evacuation of hematoma  03/2008   left neck; S/P endarterectomy; "cause heparin clotted it up"   I & D EXTREMITY Left 02/11/2015   Procedure: IRRIGATION AND DEBRIDEMENT LEFT LONG FINGER;  Surgeon: Betha Loa, MD;  Location: [facility];  Service: Orthopedics;  Laterality: Left;  I and D Left Long Finger   IR REMOVAL TUN ACCESS W/ PORT W/O FL MOD SED  05/18/2017   MASS EXCISION Left 12/11/2014   Procedure: EXCISIONAL BIOPSY OF LEFT SUPRA CLAVICULAR NECK MASS;  Surgeon: Osborn Coho, MD;  [facility] OR;  Service: ENT;  Laterality: Left;   PERCUTANEOUS CORONARY STENT INTERVENTION (PCI-S) N/A 09/29/2011   Procedure: PERCUTANEOUS CORONARY STENT INTERVENTION (PCI-S);  Surgeon: Tonny Bollman, MD;  Location: [facility] CATH LAB;  Service: Cardiovascular;  Laterality: N/A;   POLYPECTOMY  08/22/2022   Procedure: POLYPECTOMY;  Surgeon: Jeani Hawking, MD;  Location: [facility] ENDOSCOPY;  Service: Gastroenterology;;   Tod [place] PLACEMENT  2016   RIGHT/LEFT HEART CATH AND CORONARY/GRAFT ANGIOGRAPHY N/A 12/07/2017   Procedure: RIGHT/LEFT HEART CATH AND CORONARY/GRAFT ANGIOGRAPHY;  Surgeon: Kathleene Hazel, MD;  Location: [facility] INVASIVE CV LAB;  Service: Cardiovascular;  Laterality: N/A;   SUPERFICIAL LYMPH NODE BIOPSY / EXCISION Left    VIDEO BRONCHOSCOPY  WITH ENDOBRONCHIAL ULTRASOUND N/A 07/24/2022   Procedure: VIDEO BRONCHOSCOPY WITH ENDOBRONCHIAL ULTRASOUND;  Surgeon: Leslye Peer, MD;  Location: [facility] ENDOSCOPY;  Service: Cardiopulmonary;  Laterality: N/A;     Social History:   reports that he quit smoking about 5 years ago. His smoking use included cigarettes. He started smoking about 46 years ago. He has a 82 pack-year smoking history. He has quit using smokeless tobacco.  His smokeless tobacco use included chew. He reports that he does not currently use drugs after having used the following drugs: Marijuana. He reports that he does not drink alcohol.   Family History:  His family history includes Bone cancer in his mother; Heart disease in his father.   Allergies Allergies  Allergen Reactions   Bendamustine Hcl     See hospital notes from 01/12/15, legs turned black   Heparin Other (See Comments)    Heparin induced thrombocytopenia    Bendamustine Hcl Rash    Severe   Tape Rash    If left on for prolong periods of time      Home Medications  Prior to Admission medications   Medication Sig Start Date End Date Taking? Authorizing Provider  albuterol (PROVENTIL) (2.5 MG/3ML) 0.083% nebulizer solution INHALE 1 VIAL VIA NEBULIZER EVERY DAY AS NEEDED FOR WHEEZING/SHORTNESS OF BREATH 12/28/22  Yes Plotnikov, Georgina Quint, MD  aspirin EC 81 MG tablet Take 81 mg by mouth every evening.    Yes [provider]  aspirin-sod bicarb-citric acid (ALKA-SELTZER) 325 MG TBEF tablet Take 650 mg by mouth every 6 (six) hours as needed (indigestion).   Yes [provider]  clonazePAM (KLONOPIN) 1 MG tablet Take 1 tablet (1 mg total) by mouth at bedtime. 02/21/23  Yes Plotnikov, Georgina Quint, MD  clopidogrel (PLAVIX) 75 MG tablet TAKE 1 TABLET BY MOUTH EVERY DAY WITH BREAKFAST 05/22/23  Yes Plotnikov, Georgina Quint, MD  metolazone (ZAROXOLYN) 5 MG tablet Take 1 tablet (5 mg total) by mouth daily as needed (swelling). 02/07/23  Yes Plotnikov,  Georgina Quint, MD  metoprolol succinate (TOPROL-XL) 25 MG 24 hr tablet Take 0.5 tablets (12.5 mg total) by mouth daily. Take with or immediately following a meal. 05/24/23  Yes Plotnikov, Georgina Quint, MD  nitroGLYCERIN (NITROSTAT) 0.4 MG SL tablet Place 1 tablet (0.4 mg total) under the tongue every 5 (five) minutes as needed for chest pain (Up to 3 doses). 01/21/16 06/18/23 Yes Wendall Stade, MD  oxyCODONE-acetaminophen (PERCOCET) 7.5-325 MG tablet Take 1 tablet by mouth every 6 (six) hours as needed for severe pain. 02/21/23  Yes Plotnikov, Georgina Quint, MD  oxymetazoline (AFRIN) 0.05 % nasal spray Place 1 spray into both nostrils 2 (two) times daily as needed for congestion.   Yes [provider]  simvastatin (ZOCOR) 20 MG tablet Take 1 tablet (20 mg total) by mouth at bedtime. 02/12/23  Yes Plotnikov, Georgina Quint, MD  torsemide (DEMADEX) 20 MG tablet Take 20 mg by mouth daily.   Yes [provider]  senna-docusate (SENOKOT-S) 8.6-50 MG tablet Take 1-2 tablets by mouth at bedtime. Patient not taking: Reported on 06/18/2023 11/22/22 11/22/23  Plotnikov, Georgina Quint, MD     Critical care time: 85 min       CRITICAL CARE Performed by: Lanier Clam   Total critical care time: 85 minutes  Critical care time was exclusive of separately billable procedures and treating other patients.  Critical care was necessary to treat or prevent imminent or life-threatening deterioration.  Critical care was time spent personally by me on the following activities: development of treatment plan with patient and/or surrogate as well as nursing, discussions with consultants, evaluation of patient's response to treatment, examination of patient, obtaining history from patient or surrogate, ordering and performing treatments and interventions, ordering and review of laboratory studies, ordering and review of radiographic studies, pulse oximetry and re-evaluation of patient's condition.  Tessie Fass MSN,  AGACNP-BC [facility] Pulmonary/Critical Care Medicine Amion for pager  06/24/2023, 5:14 AM

## 2023-06-24 NOTE — Significant Event (Signed)
Rapid Response Event Note   Reason for Call : Respiratory failure COPD/CHF Initial Focused Assessment:  I was notified by nursing staff of pt with decreased LOC on BIPAP. Reportedly pt is having a progressive decrease in his LOC throughout the night. Pt is obtunded, responding to noxious stimuli, not following commands or localizing. BBS rales and diminished. + JVD. Skin is pink, warm and dry with multiple areas of ecchymosis. Abd soft, NT.   0440-100.69F, HR 106 aflutter, 82/56(65), RR 20 on BIPAP 14/8 with 8L bled in. Sats 99%.    Interventions:  -New PIV -500cc bolus LR -tx 2H21   MD Notified: Dr. Lazarus Salines and Tessie Fass NP at bedside.  Call Time: 0348 Arrival Time: 0352 End Time: 0520  Rose Fillers, RN

## 2023-06-24 NOTE — Procedures (Addendum)
Intubation Procedure Note  [name]  [id number]  [birth year]  Date:06/24/23  Time:6:27 AM   Provider Performing:Rhenda [place]    Procedure: Intubation (31500)  Indication(s) Respiratory Failure  Consent Risks of the procedure as well as the alternatives and risks of each were explained to the patient and/or caregiver.  Oral Consent for the procedure was obtained from wife / No written consent due to emergent nature  Anesthesia Etomidate and Rocuronium   Time Out Verified patient identification, verified procedure, site/side was marked, verified correct patient position, special equipment/implants available, medications/allergies/relevant history reviewed, required imaging and test results available.   Sterile Technique Usual hand hygeine, masks, and gloves were used   Procedure Description Patient positioned in bed supine.  Sedation given as noted above.  Patient was intubated with endotracheal tube using Glidescope.  View was Grade 1 full glottis .  Number of attempts was 1.  Colorimetric CO2 detector was consistent with tracheal placement.   Complications/Tolerance None; patient tolerated the procedure well. Chest X-ray is ordered to verify placement.   EBL NONE   Specimen(s) None      SIGNATURE    Dr. Kalman Shan, M.D., F.C.C.P,  Pulmonary and Critical Care Medicine Staff Physician, [facility] Center Director - Interstitial Lung Disease  Program  Pulmonary Fibrosis [facility] Network at [facility] [place], [place], [address]   Pager: [phone], If no answer  -> Check AMION or Try [phone] Telephone (clinical office): [phone] Telephone (research): [phone]  6:28 AM 06/24/2023

## 2023-06-24 NOTE — Progress Notes (Addendum)
The echocardiogram shows interval reduction in LVEF which is now moderately depressed with wall motion abnormality predominantly in the attribution of the right coronary artery.  His initial high-sensitivity troponin is markedly elevated at 22,000.  He had transient ST segment depression in the lateral leads, which has returned to baseline.  It appears that he has had an inferior wall NSTEMI, although it is unclear whether this was a demand infarction in the setting of hypertension or if it was indeed the cause of his deterioration. He has a history of heparin-induced thrombocytopenia.  We can use bivalirudin for anticoagulation, while closely monitoring his hemoglobin (he has evidence of iron deficiency anemia). Will also resume aspirin and clopidogrel.  I do not think he is a candidate for redo bypass surgery under any circumstances, so we can use long-acting antiplatelet agents. He last received apixaban yesterday. Discussed the current situation with Dr. [place], on-call for interventional/STEMI.  He agrees that although right and left heart catheterization is indicated at some point in the future, we may cause more harm than good by performing that today, until his renal function situation is clarified.

## 2023-06-24 NOTE — Progress Notes (Signed)
Pharmacy Antibiotic Note  [name] is a 67 y.o. male admitted on 06/18/2023, now with concern for sepsis/cellulitis.  Pharmacy has been consulted for vancomycin dosing.  Patient received loading dose of vancomycin 2000 mg x1 on 11/10 at 0255. Most recent Scr increased from 1.27 to 2.46. Patient will need to change to variable dosing per renal function for now. Patient also has minimal urine output this morning.  Plan: Vancomycin 2000mg  LD to be followed by variable dosing. Monitor daily renal function and urine output Monitor fever, WBC, and micro results.  Height: 6' (182.9 cm) Weight: 113.9 kg (251 lb 1.7 oz) IBW/kg (Calculated) : 77.6  Temp (24hrs), Avg:99.2 F (37.3 C), Min:97.7 F (36.5 C), Max:100.4 F (38 C)  Recent Labs  Lab 06/18/23 1213 06/19/23 0517 06/21/23 0500 06/22/23 0330 06/23/23 0227 06/24/23 0301 06/24/23 0322 06/24/23 0551 06/24/23 0556 06/24/23 0853  WBC 7.6  --  7.4  --  7.5 10.6*  --   --  11.3*  --   CREATININE 1.96*   < > 1.28* 1.38* 1.27* 2.46*  --  2.64*  --   --   LATICACIDVEN  --   --   --   --   --   --  >9.0* >9.0*  --  8.0*   < > = values in this interval not displayed.    Estimated Creatinine Clearance: 34.4 mL/min (A) (by C-G formula based on SCr of 2.64 mg/dL (H)).    Allergies  Allergen Reactions   Bendamustine Hcl     See hospital notes from 01/12/15, legs turned black   Heparin Other (See Comments)    Heparin induced thrombocytopenia    Bendamustine Hcl Rash    Severe   Tape Rash    If left on for prolong periods of time     Thank you for allowing pharmacy to be a part of this patient's care.  Wilmer Floor, PharmD PGY2 Cardiology Pharmacy Resident 06/24/2023 12:20 PM

## 2023-06-24 NOTE — Progress Notes (Signed)
Patient with increased work of breathing on CPAP, patient placed on BIPAP with improvement in work of breathing.

## 2023-06-24 NOTE — Progress Notes (Signed)
Pharmacy Antibiotic Note  [name] is a 67 y.o. male admitted on 06/18/2023, now with concern for cellulitis.  Pharmacy has been consulted for vancomycin dosing.  Plan: Vancomycin 2000mg  x1 then 750mg  IV Q12H. Goal AUC 400-550.  Expected AUC 480.  Height: 6' (182.9 cm) Weight: 113.9 kg (251 lb 1.7 oz) IBW/kg (Calculated) : 77.6  Temp (24hrs), Avg:99.1 F (37.3 C), Min:97.7 F (36.5 C), Max:100.4 F (38 C)  Recent Labs  Lab 06/18/23 1213 06/19/23 0517 06/20/23 0315 06/21/23 0500 06/22/23 0330 06/23/23 0227  WBC 7.6  --   --  7.4  --  7.5  CREATININE 1.96* 1.76* 1.58* 1.28* 1.38* 1.27*    Estimated Creatinine Clearance: 71.5 mL/min (A) (by C-G formula based on SCr of 1.27 mg/dL (H)).    Allergies  Allergen Reactions   Bendamustine Hcl     See hospital notes from 01/12/15, legs turned black   Heparin Other (See Comments)    Heparin induced thrombocytopenia    Bendamustine Hcl Rash    Severe   Tape Rash    If left on for prolong periods of time     Thank you for allowing pharmacy to be a part of this patient's care.  Vernard Gambles, PharmD, BCPS  06/24/2023 1:03 AM

## 2023-06-24 NOTE — Progress Notes (Signed)
PHARMACY - ANTICOAGULATION CONSULT NOTE  Pharmacy Consult for bivalirudin Indication:  atrial flutter  Allergies  Allergen Reactions   Bendamustine Hcl     See hospital notes from 01/12/15, legs turned black   Heparin Other (See Comments)    Heparin induced thrombocytopenia    Bendamustine Hcl Rash    Severe   Tape Rash    If left on for prolong periods of time     Patient Measurements: Height: 6' (182.9 cm) Weight: 113.9 kg (251 lb 1.7 oz) IBW/kg (Calculated) : 77.6 Heparin Dosing Weight: 113.9 kg  Vital Signs: Temp: 97.9 F (36.6 C) (11/10 1137) Temp Source: Oral (11/10 1137) BP: 97/62 (11/10 1100) Pulse Rate: 92 (11/10 1100)  Labs: Recent Labs    06/23/23 0227 06/24/23 0301 06/24/23 0551 06/24/23 0556 06/24/23 0840 06/24/23 1011  HGB 7.9* 8.3*  --  8.0* 9.5*  --   HCT 28.0* 30.2*  --  28.7* 28.0*  --   PLT 141* 186  --  159  --   --   CREATININE 1.27* 2.46* 2.64*  --   --   --   TROPONINIHS  --   --   --   --   --  22,242*    Estimated Creatinine Clearance: 34.4 mL/min (A) (by C-G formula based on SCr of 2.64 mg/dL (H)).   Medical History: Past Medical History:  Diagnosis Date   Anxiety    Basal cell carcinoma of nose    Blood dyscrasia    trouble clotting    Carotid artery disease (HCC)    a. s/p L CEA 2009 (hx of evacuation of hematoma due to heparin); b. 01/2022 Carotid U/S: 1-39% bilat stenosis.   Chronic heart failure with preserved ejection fraction (HFpEF) (HCC)    a. 04/2022 Echo: EF 60-65%, no rwma, GrII DD, nl RV fxn, RVSP 47.27mmHg. Mild BAE. Mild MR. Mild-mod TR.   Chronic lower back pain    CKD (chronic kidney disease), stage III (HCC)    COPD (chronic obstructive pulmonary disease) (HCC)    Coronary artery disease    a. 2005 s/p CABG x 3 (LIMA->LAD, VG->OM1, VG->dRCA); b. 09/2011 s/p PCI/DES of the VG->OM1 and VG->dRCA; c. 11/2017 PCI: LM 50ost, LAD 90ost/p, OM1 100 CTO, RCA 100p CTO, VG->dRCA 99 ISR (4.0x22 Resolute Onyx DES), VG->OM1 100  CTO/ISR, LIMA->LAD patent.   Depression    Diabetes mellitus without complication (HCC)    borderline    Dysrhythmia    fluttering   Edema    GERD (gastroesophageal reflux disease)    Heart murmur    Heparin induced thrombocytopenia (HCC)    History of home oxygen therapy    2 liters at night prn   Hyperlipidemia    Hypertension    Lymphoma (HCC) 12/21/2014   Myocardial infarction (HCC)    Normocytic anemia    Obesity    Sleep apnea    a. Not using CPAP.    Medications:  Medications Prior to Admission  Medication Sig Dispense Refill Last Dose   albuterol (PROVENTIL) (2.5 MG/3ML) 0.083% nebulizer solution INHALE 1 VIAL VIA NEBULIZER EVERY DAY AS NEEDED FOR WHEEZING/SHORTNESS OF BREATH 75 mL 5 Past Month   aspirin EC 81 MG tablet Take 81 mg by mouth every evening.    06/17/2023   aspirin-sod bicarb-citric acid (ALKA-SELTZER) 325 MG TBEF tablet Take 650 mg by mouth every 6 (six) hours as needed (indigestion).   Past Week   clonazePAM (KLONOPIN) 1 MG tablet Take 1 tablet (  1 mg total) by mouth at bedtime. 30 tablet 5 06/17/2023   clopidogrel (PLAVIX) 75 MG tablet TAKE 1 TABLET BY MOUTH EVERY DAY WITH BREAKFAST 90 tablet 3 06/18/2023 at AM   metolazone (ZAROXOLYN) 5 MG tablet Take 1 tablet (5 mg total) by mouth daily as needed (swelling). 90 tablet 1 06/17/2023   metoprolol succinate (TOPROL-XL) 25 MG 24 hr tablet Take 0.5 tablets (12.5 mg total) by mouth daily. Take with or immediately following a meal.   06/17/2023   nitroGLYCERIN (NITROSTAT) 0.4 MG SL tablet Place 1 tablet (0.4 mg total) under the tongue every 5 (five) minutes as needed for chest pain (Up to 3 doses). 25 tablet 1 unknown   oxyCODONE-acetaminophen (PERCOCET) 7.5-325 MG tablet Take 1 tablet by mouth every 6 (six) hours as needed for severe pain. 120 tablet 0 06/18/2023   oxymetazoline (AFRIN) 0.05 % nasal spray Place 1 spray into both nostrils 2 (two) times daily as needed for congestion.   unknown   simvastatin (ZOCOR) 20 MG  tablet Take 1 tablet (20 mg total) by mouth at bedtime. 90 tablet 3 06/17/2023   torsemide (DEMADEX) 20 MG tablet Take 20 mg by mouth daily.   06/17/2023   senna-docusate (SENOKOT-S) 8.6-50 MG tablet Take 1-2 tablets by mouth at bedtime. (Patient not taking: Reported on 06/18/2023) 100 tablet 6 Not Taking   Scheduled:   aspirin  81 mg Per NG tube Once   Chlorhexidine Gluconate Cloth  6 each Topical Daily   clopidogrel  300 mg Per NG tube Daily   dapagliflozin propanediol  10 mg Oral Daily   dextrose       docusate  100 mg Per Tube BID   fentaNYL (SUBLIMAZE) injection  25 mcg Intravenous Once   lactulose  30 g Oral Q4H   lactulose  300 mL Rectal QID   midodrine  5 mg Oral TID WC   mouth rinse  15 mL Mouth Rinse 4 times per day   polyethylene glycol  17 g Per Tube Daily   simvastatin  20 mg Oral QHS   sodium chloride flush  3 mL Intravenous Q12H   Infusions:   sodium chloride 10 mL/hr at 06/24/23 1100   bivalirudin (ANGIOMAX) 250 mg in sodium chloride 0.9 % 500 mL (0.5 mg/mL) infusion     ceFEPime (MAXIPIME) IV 2 g (06/24/23 1123)   fentaNYL infusion INTRAVENOUS 25 mcg/hr (06/24/23 1100)   metronidazole 500 mg (06/24/23 0219)   norepinephrine (LEVOPHED) Adult infusion 6 mcg/min (06/24/23 1100)   vancomycin     PRN: acetaminophen, dextrose, fentaNYL, fentaNYL (SUBLIMAZE) injection, fentaNYL (SUBLIMAZE) injection, ondansetron (ZOFRAN) IV, mouth rinse, senna-docusate, sodium chloride flush  Assessment: 15 YOM with PMH of follicular lymphoma in remission, RV failure, severe PAH, HTN, HLD, CAD, Afib/flutter, OSA, chronic lower extremity wounds. Returned to ICU 11/10 with worsening fever, hypotension consistent with septic shock. Patient is also persistently in either AF or Aflutter. Previously patient was on Eliquis. Will start IV AC with bivalirudin. Pt most recent CBC is stable with Hgb 9.5 and platelet 159.  Goal of Therapy:  aPTT 50-85 seconds Monitor platelets by anticoagulation  protocol: Yes   Plan:  Start bivalirudin at 0.05 mg/kg/hour Check 4 hour aPTT Monitor daily aPTT, CBC, and s/sx of bleeding/bruising.  Wilmer Floor, PharmD PGY2 Cardiology Pharmacy Resident 06/24/2023,11:57 AM

## 2023-06-24 NOTE — Progress Notes (Signed)
Pt transported to CT3 and back to room 2H21 on vent with RT, RN and transport. No complications

## 2023-06-24 NOTE — Progress Notes (Signed)
PCCM:  Please see Dr. Jane Canary note from a few hours ago.   I met with patients wife at bedside.   Added IV sedation medication orders.   I will touch base with cardiology regarding their thoughts on next steps.   KUB for OGT placement   Stopped IVF at 200cc hr  This patient is critically ill with multiple organ system failure; which, requires frequent high complexity decision making, assessment, support, evaluation, and titration of therapies. This was completed through the application of advanced monitoring technologies and extensive interpretation of multiple databases. During this encounter critical care time was devoted to patient care services described in this note for 20 minutes.  Josephine Igo, DO [facility] 06/24/2023 8:58 AM

## 2023-06-24 NOTE — Progress Notes (Signed)
  PCCM:  Case discussed with cardiology. Significantly elevated troponin. EF down on ECHO. History of hit. Started bival. Was given asa and plavix. Dr. Salena Saner to talk to interventionalist on call.   Josephine Igo, DO [facility] 06/24/2023 11:15 AM

## 2023-06-24 NOTE — Significant Event (Addendum)
Significant event note:   Notified by RN of fever to 100.4, hypotension with BP 79/61 (68), tachycardia in low 100's. On evaluation he is somnolent and requires a hard sternal rub to wake and answer "yes" to name. Does not open eyes. Currently on CPAP. On exam tachycardic, regular, referred upper airway sounds, tachypneic, abd round slightly distended, nontender, there is anasarca and pitting edema, extremities are warm   Assessment / plan:   Worsening sepsis, source unknown  Shock, mixed suspect cardiogenic, septic  Encephalopathy, ? Hepatic with hyperammonemia  - Check Bcx, RVP, CXR   - Start on Vancomycin, Cefepime, Flagyl  - Due to his RV overload worry that IVF may actually worsen his cardiac output  - Held diuretics temporarily until stabilized  - Start on Midodrine 5 mg TID  - Check Lactate, VBG, BMP, CBC  - If BP worsens likely needs ICU for early initiation of pressors with his RV failure  - Start Lactulose enema TID given currently unable to take POs   Update: Mental status remains poor, desaturation and RT has placed on BiPAP worried about his mental status to continue on BiPAP. Altered and I feel likely related to hyperammonemia, needs lactulose. Consulted PCCM who will see, recommended for 500 cc bolus for pressures.   Dolly Rias, MD  [facility]

## 2023-06-24 NOTE — Progress Notes (Addendum)
3cm by 3cm deep tissue injury found on buttocks upon admission to 2H. Wound team consulted.

## 2023-06-24 NOTE — Progress Notes (Addendum)
eLink Physician-Brief Progress Note Patient Name: [name] DOB: 1955/08/31 MRN: [id number]   Date of Service  06/24/2023  HPI/Events of Note  67 yo M PMH follicular lymphoma in remission, RV failure, severe PAH g3, HTN, HLD, CAD, Afib/flutter, OSA/OHS, obesity, venous insufficiency, chronic lower extremity wounds who was admitted 11/4 for lower extremity/groin swelling after presenting to his outpt wound care clinic.  Returning to the ICU for fevers, hypotension, and encephalopathy consistent with septic shock.  On examination, the patient is tachypneic, tachycardic, and hypotensive requiring norepinephrine infusion peripherally.  Given broad-spectrum antibiotics and supported on noninvasive ventilation.  Results consistent with chronic hypercapnia.  Metabolic panel with elevated creatinine, hyperglycemia, lactic acid above 9.  Minimal leukocytosis, macrocytic anemia, and cultures been collected.  Chest radiograph with right greater than left infiltrates and cardiomegaly.  eICU Interventions  Maintain n.p.o. status, noninvasive ventilation. Anticipate invasive ventilation shortly.  Norepinephrine to maintain MAP greater than 65, limited fluid resuscitation in the setting of RV dysfunction.  Midodrine on hold in setting of n.p.o. status.  May need the addition of bicarbonate in the setting of profound acidosis.  Will likely need enteral tube at some point for medications.  DVT prophylaxis with apixaban GI prophylaxis not currently indicated   0701 -now intubated.  Chest radiograph still pending.  Repeat lactic was greater than 9.  Maintain vasopressors for the time being.  Will repeat labs at noon.  Intervention Category Evaluation Type: New Patient Evaluation  Dorlene Footman 06/24/2023, 5:20 AM

## 2023-06-25 ENCOUNTER — Ambulatory Visit: Payer: Medicare Other | Admitting: Internal Medicine

## 2023-06-25 ENCOUNTER — Encounter (HOSPITAL_COMMUNITY): Admission: EM | Disposition: E | Payer: Self-pay | Source: Ambulatory Visit | Attending: Pulmonary Disease

## 2023-06-25 DIAGNOSIS — J9601 Acute respiratory failure with hypoxia: Secondary | ICD-10-CM

## 2023-06-25 DIAGNOSIS — R570 Cardiogenic shock: Secondary | ICD-10-CM | POA: Diagnosis not present

## 2023-06-25 DIAGNOSIS — I5043 Acute on chronic combined systolic (congestive) and diastolic (congestive) heart failure: Secondary | ICD-10-CM

## 2023-06-25 DIAGNOSIS — I2510 Atherosclerotic heart disease of native coronary artery without angina pectoris: Secondary | ICD-10-CM | POA: Diagnosis not present

## 2023-06-25 DIAGNOSIS — I2781 Cor pulmonale (chronic): Secondary | ICD-10-CM | POA: Diagnosis not present

## 2023-06-25 DIAGNOSIS — J9612 Chronic respiratory failure with hypercapnia: Secondary | ICD-10-CM

## 2023-06-25 DIAGNOSIS — I5033 Acute on chronic diastolic (congestive) heart failure: Secondary | ICD-10-CM | POA: Diagnosis not present

## 2023-06-25 DIAGNOSIS — I214 Non-ST elevation (NSTEMI) myocardial infarction: Secondary | ICD-10-CM | POA: Diagnosis not present

## 2023-06-25 HISTORY — PX: RIGHT/LEFT HEART CATH AND CORONARY ANGIOGRAPHY: CATH118266

## 2023-06-25 HISTORY — PX: RIGHT/LEFT HEART CATH AND CORONARY/GRAFT ANGIOGRAPHY: CATH118267

## 2023-06-25 LAB — POCT I-STAT EG7
Acid-Base Excess: 10 mmol/L — ABNORMAL HIGH (ref 0.0–2.0)
Acid-Base Excess: 16 mmol/L — ABNORMAL HIGH (ref 0.0–2.0)
Acid-Base Excess: 17 mmol/L — ABNORMAL HIGH (ref 0.0–2.0)
Acid-Base Excess: 17 mmol/L — ABNORMAL HIGH (ref 0.0–2.0)
Bicarbonate: 36.9 mmol/L — ABNORMAL HIGH (ref 20.0–28.0)
Bicarbonate: 42.9 mmol/L — ABNORMAL HIGH (ref 20.0–28.0)
Bicarbonate: 43.4 mmol/L — ABNORMAL HIGH (ref 20.0–28.0)
Bicarbonate: 44.2 mmol/L — ABNORMAL HIGH (ref 20.0–28.0)
Calcium, Ion: 1.02 mmol/L — ABNORMAL LOW (ref 1.15–1.40)
Calcium, Ion: 1.07 mmol/L — ABNORMAL LOW (ref 1.15–1.40)
Calcium, Ion: 1.08 mmol/L — ABNORMAL LOW (ref 1.15–1.40)
Calcium, Ion: 1.09 mmol/L — ABNORMAL LOW (ref 1.15–1.40)
HCT: 31 % — ABNORMAL LOW (ref 39.0–52.0)
HCT: 31 % — ABNORMAL LOW (ref 39.0–52.0)
HCT: 31 % — ABNORMAL LOW (ref 39.0–52.0)
HCT: 32 % — ABNORMAL LOW (ref 39.0–52.0)
Hemoglobin: 10.5 g/dL — ABNORMAL LOW (ref 13.0–17.0)
Hemoglobin: 10.5 g/dL — ABNORMAL LOW (ref 13.0–17.0)
Hemoglobin: 10.5 g/dL — ABNORMAL LOW (ref 13.0–17.0)
Hemoglobin: 10.9 g/dL — ABNORMAL LOW (ref 13.0–17.0)
O2 Saturation: 75 %
O2 Saturation: 78 %
O2 Saturation: 80 %
O2 Saturation: 81 %
Potassium: 3.3 mmol/L — ABNORMAL LOW (ref 3.5–5.1)
Potassium: 3.6 mmol/L (ref 3.5–5.1)
Potassium: 3.7 mmol/L (ref 3.5–5.1)
Potassium: 3.7 mmol/L (ref 3.5–5.1)
Sodium: 129 mmol/L — ABNORMAL LOW (ref 135–145)
Sodium: 137 mmol/L (ref 135–145)
Sodium: 137 mmol/L (ref 135–145)
Sodium: 138 mmol/L (ref 135–145)
TCO2: 39 mmol/L — ABNORMAL HIGH (ref 22–32)
TCO2: 45 mmol/L — ABNORMAL HIGH (ref 22–32)
TCO2: 45 mmol/L — ABNORMAL HIGH (ref 22–32)
TCO2: 46 mmol/L — ABNORMAL HIGH (ref 22–32)
pCO2, Ven: 64.4 mm[Hg] — ABNORMAL HIGH (ref 44–60)
pCO2, Ven: 64.5 mm[Hg] — ABNORMAL HIGH (ref 44–60)
pCO2, Ven: 64.6 mm[Hg] — ABNORMAL HIGH (ref 44–60)
pCO2, Ven: 64.8 mm[Hg] — ABNORMAL HIGH (ref 44–60)
pH, Ven: 7.365 (ref 7.25–7.43)
pH, Ven: 7.431 — ABNORMAL HIGH (ref 7.25–7.43)
pH, Ven: 7.434 — ABNORMAL HIGH (ref 7.25–7.43)
pH, Ven: 7.444 — ABNORMAL HIGH (ref 7.25–7.43)
pO2, Ven: 43 mm[Hg] (ref 32–45)
pO2, Ven: 44 mm[Hg] (ref 32–45)
pO2, Ven: 45 mm[Hg] (ref 32–45)
pO2, Ven: 46 mm[Hg] — ABNORMAL HIGH (ref 32–45)

## 2023-06-25 LAB — BASIC METABOLIC PANEL
Anion gap: 13 (ref 5–15)
BUN: 74 mg/dL — ABNORMAL HIGH (ref 8–23)
CO2: 40 mmol/L — ABNORMAL HIGH (ref 22–32)
Calcium: 8.6 mg/dL — ABNORMAL LOW (ref 8.9–10.3)
Chloride: 86 mmol/L — ABNORMAL LOW (ref 98–111)
Creatinine, Ser: 1.95 mg/dL — ABNORMAL HIGH (ref 0.61–1.24)
GFR, Estimated: 37 mL/min — ABNORMAL LOW (ref >60)
Glucose, Bld: 116 mg/dL — ABNORMAL HIGH (ref 70–99)
Potassium: 3.3 mmol/L — ABNORMAL LOW (ref 3.5–5.1)
Sodium: 139 mmol/L (ref 135–145)

## 2023-06-25 LAB — CBC
HCT: 26.5 % — ABNORMAL LOW (ref 39.0–52.0)
Hemoglobin: 7.9 g/dL — ABNORMAL LOW (ref 13.0–17.0)
MCH: 28 pg (ref 26.0–34.0)
MCHC: 29.8 g/dL — ABNORMAL LOW (ref 30.0–36.0)
MCV: 94 fL (ref 80.0–100.0)
Platelets: 209 10*3/uL (ref 150–400)
RBC: 2.82 MIL/uL — ABNORMAL LOW (ref 4.22–5.81)
RDW: 19.9 % — ABNORMAL HIGH (ref 11.5–15.5)
WBC: 14.1 10*3/uL — ABNORMAL HIGH (ref 4.0–10.5)
nRBC: 0 % (ref 0.0–0.2)

## 2023-06-25 LAB — LACTIC ACID, PLASMA: Lactic Acid, Venous: 1.6 mmol/L (ref 0.5–1.9)

## 2023-06-25 LAB — APTT: aPTT: 77 s — ABNORMAL HIGH (ref 24–36)

## 2023-06-25 LAB — AMMONIA: Ammonia: 32 umol/L (ref 9–35)

## 2023-06-25 LAB — GLUCOSE, CAPILLARY
Glucose-Capillary: 110 mg/dL — ABNORMAL HIGH (ref 70–99)
Glucose-Capillary: 115 mg/dL — ABNORMAL HIGH (ref 70–99)
Glucose-Capillary: 115 mg/dL — ABNORMAL HIGH (ref 70–99)
Glucose-Capillary: 123 mg/dL — ABNORMAL HIGH (ref 70–99)

## 2023-06-25 LAB — COOXEMETRY PANEL
Carboxyhemoglobin: 2.9 % — ABNORMAL HIGH (ref 0.5–1.5)
Methemoglobin: <0.7 % (ref 0.0–1.5)
O2 Saturation: 75.7 %
Total hemoglobin: 8.9 g/dL — ABNORMAL LOW (ref 12.0–16.0)

## 2023-06-25 SURGERY — RIGHT/LEFT HEART CATH AND CORONARY ANGIOGRAPHY
Anesthesia: LOCAL

## 2023-06-25 MED ORDER — HYDRALAZINE HCL 20 MG/ML IJ SOLN: 10.0000 mg | INTRAMUSCULAR | Status: DC | PRN

## 2023-06-25 MED ORDER — VANCOMYCIN HCL 1500 MG/300ML IV SOLN
1500.0000 mg | INTRAVENOUS | Status: DC
  Filled 2023-06-25: qty 300

## 2023-06-25 MED ORDER — ORAL CARE MOUTH RINSE
15.0000 mL | OROMUCOSAL | Status: DC
  Administered 2023-06-25 – 2023-07-01 (×73): 15 mL via OROMUCOSAL

## 2023-06-25 MED ORDER — NITROGLYCERIN 1 MG/10 ML FOR IR/CATH LAB
INTRA_ARTERIAL | Status: AC
  Filled 2023-06-25: qty 10

## 2023-06-25 MED ORDER — AMIODARONE HCL IN DEXTROSE 360-4.14 MG/200ML-% IV SOLN
30.0000 mg/h | INTRAVENOUS | Status: DC
  Administered 2023-06-25 – 2023-06-27 (×5): 30 mg/h via INTRAVENOUS
  Administered 2023-06-27 – 2023-06-28 (×4): 60 mg/h via INTRAVENOUS
  Administered 2023-06-28 – 2023-06-29 (×2): 30 mg/h via INTRAVENOUS
  Filled 2023-06-25 (×10): qty 200

## 2023-06-25 MED ORDER — SODIUM CHLORIDE 0.9 % IV SOLN
250.0000 mL | INTRAVENOUS | Status: AC | PRN
Start: 2023-06-25 — End: 2023-06-26
  Administered 2023-06-25 (×2): 250 mL via INTRAVENOUS

## 2023-06-25 MED ORDER — DEXMEDETOMIDINE HCL IN NACL 400 MCG/100ML IV SOLN: 0.0000 ug/kg/h | INTRAVENOUS | Status: DC

## 2023-06-25 MED ORDER — VITAL 1.5 CAL PO LIQD
1000.0000 mL | ORAL | Status: DC
Start: 2023-06-25 — End: 2023-06-26
  Administered 2023-06-26: 1000 mL

## 2023-06-25 MED ORDER — VERAPAMIL HCL 2.5 MG/ML IV SOLN
INTRAVENOUS | Status: AC
  Filled 2023-06-25: qty 2

## 2023-06-25 MED ORDER — FAMOTIDINE 20 MG PO TABS
20.0000 mg | ORAL_TABLET | Freq: Every day | ORAL | Status: DC
  Administered 2023-06-25 – 2023-07-01 (×7): 20 mg
  Filled 2023-06-25 (×7): qty 1

## 2023-06-25 MED ORDER — FENTANYL 2500MCG IN NS 250ML (10MCG/ML) PREMIX INFUSION
INTRAVENOUS | Status: AC
  Filled 2023-06-25: qty 250

## 2023-06-25 MED ORDER — ORAL CARE MOUTH RINSE: 15.0000 mL | OROMUCOSAL | Status: DC | PRN

## 2023-06-25 MED ORDER — PIVOT 1.5 CAL PO LIQD: 1000.0000 mL | ORAL | Status: DC

## 2023-06-25 MED ORDER — POTASSIUM CHLORIDE 20 MEQ PO PACK
20.0000 meq | PACK | ORAL | Status: AC
  Administered 2023-06-25 (×2): 20 meq
  Filled 2023-06-25 (×2): qty 1

## 2023-06-25 MED ORDER — LIDOCAINE HCL (PF) 1 % IJ SOLN
INTRAMUSCULAR | Status: AC
  Filled 2023-06-25: qty 30

## 2023-06-25 MED ORDER — POTASSIUM CHLORIDE 10 MEQ/50ML IV SOLN
10.0000 meq | INTRAVENOUS | Status: DC
  Administered 2023-06-25 (×4): 10 meq via INTRAVENOUS
  Filled 2023-06-25 (×4): qty 50

## 2023-06-25 MED ORDER — IOHEXOL 350 MG/ML SOLN
INTRAVENOUS | Status: DC | PRN
  Administered 2023-06-25: 60 mL via INTRA_ARTERIAL

## 2023-06-25 MED ORDER — PROPOFOL 1000 MG/100ML IV EMUL
0.0000 ug/kg/min | INTRAVENOUS | Status: DC
  Administered 2023-06-25: 30 ug/kg/min via INTRAVENOUS
  Administered 2023-06-25 – 2023-06-26 (×2): 20 ug/kg/min via INTRAVENOUS
  Administered 2023-06-26: 30 ug/kg/min via INTRAVENOUS
  Administered 2023-06-26: 10 ug/kg/min via INTRAVENOUS
  Administered 2023-06-27: 20 ug/kg/min via INTRAVENOUS
  Filled 2023-06-25 (×6): qty 100

## 2023-06-25 MED ORDER — AMIODARONE HCL IN DEXTROSE 360-4.14 MG/200ML-% IV SOLN
60.0000 mg/h | INTRAVENOUS | Status: DC
  Administered 2023-06-25 (×2): 60 mg/h via INTRAVENOUS
  Filled 2023-06-25 (×2): qty 200

## 2023-06-25 MED ORDER — ASPIRIN 81 MG PO CHEW
81.0000 mg | CHEWABLE_TABLET | Freq: Once | ORAL | Status: AC
  Administered 2023-06-25: 81 mg via NASOGASTRIC
  Filled 2023-06-25: qty 1

## 2023-06-25 MED ORDER — LABETALOL HCL 5 MG/ML IV SOLN: 10.0000 mg | INTRAVENOUS | Status: DC | PRN

## 2023-06-25 MED ORDER — NITROGLYCERIN 1 MG/10 ML FOR IR/CATH LAB
INTRA_ARTERIAL | Status: DC | PRN
  Administered 2023-06-25: 100 ug via INTRACORONARY

## 2023-06-25 MED ORDER — HEPARIN (PORCINE) IN NACL 1000-0.9 UT/500ML-% IV SOLN
INTRAVENOUS | Status: DC | PRN
  Administered 2023-06-25 (×2): 500 mL

## 2023-06-25 MED ORDER — SODIUM CHLORIDE 0.9 % IV SOLN: INTRAVENOUS | Status: DC

## 2023-06-25 MED ORDER — ATORVASTATIN CALCIUM 40 MG PO TABS
40.0000 mg | ORAL_TABLET | Freq: Every day | ORAL | Status: DC
  Administered 2023-06-25: 40 mg via ORAL
  Filled 2023-06-25: qty 1

## 2023-06-25 MED ORDER — LIDOCAINE HCL (PF) 1 % IJ SOLN
INTRAMUSCULAR | Status: DC | PRN
  Administered 2023-06-25: 15 mL
  Administered 2023-06-25: 5 mL

## 2023-06-25 MED ORDER — VANCOMYCIN HCL 1500 MG/300ML IV SOLN
1500.0000 mg | INTRAVENOUS | Status: DC
  Administered 2023-06-25 – 2023-06-26 (×2): 1500 mg via INTRAVENOUS
  Filled 2023-06-25 (×3): qty 300

## 2023-06-25 MED ORDER — FENTANYL CITRATE (PF) 100 MCG/2ML IJ SOLN
INTRAMUSCULAR | Status: DC | PRN
  Administered 2023-06-25 (×3): 50 ug via INTRAVENOUS

## 2023-06-25 SURGICAL SUPPLY — 21 items
CATH INFINITI 5 FR AR1 MOD (CATHETERS) IMPLANT
CATH INFINITI 5 FR IM (CATHETERS) IMPLANT
CATH INFINITI 5FR JL5 (CATHETERS) IMPLANT
CATH INFINITI 5FR MULTPACK ANG (CATHETERS) IMPLANT
CATH SWAN GANZ 7F STRAIGHT (CATHETERS) IMPLANT
CATH SWAN GANZ VIP 7.5F (CATHETERS) IMPLANT
ELECT DEFIB PAD ADLT CADENCE (PAD) IMPLANT
GUIDEWIRE .025 260CM (WIRE) IMPLANT
KIT MICROPUNCTURE NIT STIFF (SHEATH) IMPLANT
PACK CARDIAC CATHETERIZATION (CUSTOM PROCEDURE TRAY) ×1 IMPLANT
SET ATX-X65L (MISCELLANEOUS) IMPLANT
SHEATH PINNACLE 5F 10CM (SHEATH) IMPLANT
SHEATH PINNACLE 7F 10CM (SHEATH) IMPLANT
SHEATH PINNACLE 8F 10CM (SHEATH) IMPLANT
SHEATH PROBE COVER 6X72 (BAG) IMPLANT
SHIELD CATH-GARD CONTAMINATION (MISCELLANEOUS) IMPLANT
WIRE EMERALD 3MM-J .025X260CM (WIRE) IMPLANT
WIRE EMERALD 3MM-J .035X150CM (WIRE) IMPLANT
WIRE EMERALD 3MM-J .035X260CM (WIRE) IMPLANT
WIRE HI TORQ VERSACORE-J 145CM (WIRE) IMPLANT
WIRE MICROINTRODUCER 60CM (WIRE) IMPLANT

## 2023-06-25 NOTE — Interval H&P Note (Signed)
History and Physical Interval Note:  06/25/2023 3:14 PM  [name]  has presented today for surgery, with the diagnosis of mi.  The various methods of treatment have been discussed with the patient and family. After consideration of risks, benefits and other options for treatment, the patient has consented to  Procedure(s): RIGHT/LEFT HEART CATH AND CORONARY ANGIOGRAPHY (N/A) as a surgical intervention.  The patient's history has been reviewed, patient examined, no change in status, stable for surgery.  I have reviewed the patient's chart and labs.  Questions were answered to the patient's satisfaction.     Rita Prom J Shaquinta Peruski

## 2023-06-25 NOTE — Progress Notes (Signed)
Pharmacy Antibiotic Note  [name] is a 67 y.o. male admitted on 06/18/2023, now with concern for sepsis/cellulitis.  Pharmacy has been consulted for vancomycin dosing (he is also on cefepime)  -WBC= 14.1, afebrile -SCr 2.4> 1.9 (~ 2.5 L UOP) -cultures- ngtd  Plan: -Continue vancomycin 1500mg  IV q24h (estimated AUC ~ 539) -Will follow renal function, cultures and clinical progress   Height: 6' (182.9 cm) Weight: 102.3 kg (225 lb 8.5 oz) IBW/kg (Calculated) : 77.6  Temp (24hrs), Avg:98.8 F (37.1 C), Min:97.9 F (36.6 C), Max:99.6 F (37.6 C)  Recent Labs  Lab 06/21/23 0500 06/22/23 0330 06/23/23 0227 06/24/23 0301 06/24/23 0322 06/24/23 0551 06/24/23 0556 06/24/23 0853 06/24/23 1159 06/25/23 0331 06/25/23 0409  WBC 7.4  --  7.5 10.6*  --   --  11.3*  --   --   --  14.1*  CREATININE 1.28*   < > 1.27* 2.46*  --  2.64*  --   --  2.47* 1.95*  --   LATICACIDVEN  --   --   --   --  >9.0* >9.0*  --  8.0* 5.5*  --   --    < > = values in this interval not displayed.    Estimated Creatinine Clearance: 44.2 mL/min (A) (by C-G formula based on SCr of 1.95 mg/dL (H)).    Allergies  Allergen Reactions   Bendamustine Hcl     See hospital notes from 01/12/15, legs turned black   Heparin Other (See Comments)    Heparin induced thrombocytopenia    Bendamustine Hcl Rash    Severe   Tape Rash    If left on for prolong periods of time     Thank you for allowing pharmacy to be a part of this patient's care.  Harland German, PharmD Clinical Pharmacist **Pharmacist phone directory can now be found on [email or web address] ([email or web address]).  Listed under [facility].

## 2023-06-25 NOTE — Progress Notes (Signed)
[facility] ADULT ICU REPLACEMENT PROTOCOL   The patient does apply for the [facility] Adult ICU Electrolyte Replacment Protocol based on the criteria listed below:   1.Exclusion criteria: TCTS, ECMO, Dialysis, and Myasthenia Gravis patients 2. Is GFR >/= 30 ml/min? Yes.    Patient's GFR today is 37 3. Is SCr </= 2? Yes.   Patient's SCr is 1.95 mg/dL 4. Did SCr increase >/= 0.5 in 24 hours? No. 5.Pt's weight >40kg  Yes.   6. Abnormal electrolyte(s): K+ 3.3  7. Electrolytes replaced per protocol 8.  Call MD STAT for K+ </= 2.5, Phos </= 1, or Mag </= 1 Physician:  Dr Cory Roughen, Jettie Booze 06/25/2023 5:00 AM

## 2023-06-25 NOTE — H&P (View-Only) (Signed)
Report of CABG 2005   PREOPERATIVE DIAGNOSIS:  Three-vessel coronary artery disease with class IV  unstable angina and acute myocardial infarction with critical ostial  stenosis of the left anterior descending coronary artery.    POSTOPERATIVE DIAGNOSIS:  Three-vessel coronary artery disease with class IV  unstable angina and acute myocardial infarction with critical ostial  stenosis of the left anterior descending coronary artery.    PROCEDURE:  Emergency median sternotomy for coronary artery bypass grafting  x3 (left internal mammary artery to distal left anterior descending coronary  artery, saphenous vein graft to ramus intermediate branch, saphenous vein  graft to posterior descending coronary artery, endoscopic saphenous vein  harvest from left thigh).

## 2023-06-25 NOTE — Progress Notes (Signed)
   06/25/23 1015  Spiritual Encounters  Type of Visit Initial  Conversation partners present during encounter Nurse  Referral source Nurse (RN/NT/LPN)  Reason for visit Routine spiritual support  OnCall Visit No   Chaplain responded to spiritual consult for support for Pt spouse.  When chaplain arrived in the room wife was not present.  Pt intubated and sedated.  Chaplain spoke with RN about wife and RN will call chaplain when wife arrives.  Chaplain services remain available by Spiritual Consult or for emergent cases, paging [phone]  Chaplain Raelene Bott, MDiv Roston [email or web address] [phone]

## 2023-06-25 NOTE — Progress Notes (Signed)
   06/25/23 1335  Spiritual Encounters  Type of Visit Follow up  Care provided to: Pt and family  Conversation partners present during encounter Nurse  Referral source Nurse (RN/NT/LPN)  Reason for visit Routine spiritual support   Chaplain responding to a call from the RN that the Pt's wife had arrived.  Chaplain arrived in the room and introduced himself.  Wife of Pt was very receptive to my presence and began to immediately share her experience and emotions.   Wife describes a large community of support behind her to include family, church pastor, church members, and neighbors, all helping where they can.  Wife is very grateful and feels strength from this. Pt possesses a strong faith, though she worries that her faith is wavering because she experiences moments of doubt.  Chaplain worked to explore her faith deeper and hep her find security.  Chaplain also worked to help normalize the emotions of worry and fear. Chaplain encouraged storytelling by asking open ended questions and allowed the wife to share what her husband, the Pt, is like and who he is. Chaplain helped Pt explore her feelings, her faith, and her eternal hope. Finally, chaplain offered prayer when asked.  Chaplain services remain available by Spiritual Consult or for emergent cases, paging [phone]  Chaplain Raelene Bott, MDiv [email or web address] [email or web address] [phone]

## 2023-06-25 NOTE — Progress Notes (Signed)
Report of CABG 2005   PREOPERATIVE DIAGNOSIS:  Three-vessel coronary artery disease with class IV  unstable angina and acute myocardial infarction with critical ostial  stenosis of the left anterior descending coronary artery.    POSTOPERATIVE DIAGNOSIS:  Three-vessel coronary artery disease with class IV  unstable angina and acute myocardial infarction with critical ostial  stenosis of the left anterior descending coronary artery.    PROCEDURE:  Emergency median sternotomy for coronary artery bypass grafting  x3 (left internal mammary artery to distal left anterior descending coronary  artery, saphenous vein graft to ramus intermediate branch, saphenous vein  graft to posterior descending coronary artery, endoscopic saphenous vein  harvest from left thigh).

## 2023-06-25 NOTE — Progress Notes (Signed)
PHARMACY - ANTICOAGULATION CONSULT NOTE  Pharmacy Consult for bivalirudin Indication:  atrial flutter  Allergies  Allergen Reactions   Bendamustine Hcl     See hospital notes from 01/12/15, legs turned black   Heparin Other (See Comments)    Heparin induced thrombocytopenia    Bendamustine Hcl Rash    Severe   Tape Rash    If left on for prolong periods of time     Patient Measurements: Height: 6' (182.9 cm) Weight: 102.3 kg (225 lb 8.5 oz) IBW/kg (Calculated) : 77.6 Heparin Dosing Weight: 113.9 kg  Vital Signs: Temp: 98.8 F (37.1 C) (11/11 1100) Temp Source: Axillary (11/11 1100) BP: 125/64 (11/11 1655) Pulse Rate: 94 (11/11 1655)  Labs: Recent Labs    06/24/23 0301 06/24/23 0551 06/24/23 0556 06/24/23 0840 06/24/23 1011 06/24/23 1159 06/24/23 1645 06/24/23 2208 06/25/23 0331 06/25/23 0409  HGB 8.3*  --  8.0* 9.5*  --   --   --   --   --  7.9*  HCT 30.2*  --  28.7* 28.0*  --   --   --   --   --  26.5*  PLT 186  --  159  --   --   --   --   --   --  209  APTT  --   --   --   --   --   --  68* 77* 77*  --   CREATININE 2.46* 2.64*  --   --   --  2.47*  --   --  1.95*  --   TROPONINIHS  --   --   --   --  22,242* >24,000*  --   --   --   --     Estimated Creatinine Clearance: 44.2 mL/min (A) (by C-G formula based on SCr of 1.95 mg/dL (H)).   Medical History: Past Medical History:  Diagnosis Date   Anxiety    Basal cell carcinoma of nose    Blood dyscrasia    trouble clotting    Carotid artery disease (HCC)    a. s/p L CEA 2009 (hx of evacuation of hematoma due to heparin); b. 01/2022 Carotid U/S: 1-39% bilat stenosis.   Chronic heart failure with preserved ejection fraction (HFpEF) (HCC)    a. 04/2022 Echo: EF 60-65%, no rwma, GrII DD, nl RV fxn, RVSP 47.53mmHg. Mild BAE. Mild MR. Mild-mod TR.   Chronic lower back pain    CKD (chronic kidney disease), stage III (HCC)    COPD (chronic obstructive pulmonary disease) (HCC)    Coronary artery disease    a.  2005 s/p CABG x 3 (LIMA->LAD, VG->OM1, VG->dRCA); b. 09/2011 s/p PCI/DES of the VG->OM1 and VG->dRCA; c. 11/2017 PCI: LM 50ost, LAD 90ost/p, OM1 100 CTO, RCA 100p CTO, VG->dRCA 99 ISR (4.0x22 Resolute Onyx DES), VG->OM1 100 CTO/ISR, LIMA->LAD patent.   Depression    Diabetes mellitus without complication (HCC)    borderline    Dysrhythmia    fluttering   Edema    GERD (gastroesophageal reflux disease)    Heart murmur    Heparin induced thrombocytopenia (HCC)    History of home oxygen therapy    2 liters at night prn   Hyperlipidemia    Hypertension    Lymphoma (HCC) 12/21/2014   Myocardial infarction (HCC)    Normocytic anemia    Obesity    Sleep apnea    a. Not using CPAP.    Medications:  Medications Prior  to Admission  Medication Sig Dispense Refill Last Dose   albuterol (PROVENTIL) (2.5 MG/3ML) 0.083% nebulizer solution INHALE 1 VIAL VIA NEBULIZER EVERY DAY AS NEEDED FOR WHEEZING/SHORTNESS OF BREATH 75 mL 5 Past Month   aspirin EC 81 MG tablet Take 81 mg by mouth every evening.    06/17/2023   aspirin-sod bicarb-citric acid (ALKA-SELTZER) 325 MG TBEF tablet Take 650 mg by mouth every 6 (six) hours as needed (indigestion).   Past Week   clonazePAM (KLONOPIN) 1 MG tablet Take 1 tablet (1 mg total) by mouth at bedtime. 30 tablet 5 06/17/2023   clopidogrel (PLAVIX) 75 MG tablet TAKE 1 TABLET BY MOUTH EVERY DAY WITH BREAKFAST 90 tablet 3 06/18/2023 at AM   metolazone (ZAROXOLYN) 5 MG tablet Take 1 tablet (5 mg total) by mouth daily as needed (swelling). 90 tablet 1 06/17/2023   metoprolol succinate (TOPROL-XL) 25 MG 24 hr tablet Take 0.5 tablets (12.5 mg total) by mouth daily. Take with or immediately following a meal.   06/17/2023   nitroGLYCERIN (NITROSTAT) 0.4 MG SL tablet Place 1 tablet (0.4 mg total) under the tongue every 5 (five) minutes as needed for chest pain (Up to 3 doses). 25 tablet 1 unknown   oxyCODONE-acetaminophen (PERCOCET) 7.5-325 MG tablet Take 1 tablet by mouth every 6  (six) hours as needed for severe pain. 120 tablet 0 06/18/2023   oxymetazoline (AFRIN) 0.05 % nasal spray Place 1 spray into both nostrils 2 (two) times daily as needed for congestion.   unknown   simvastatin (ZOCOR) 20 MG tablet Take 1 tablet (20 mg total) by mouth at bedtime. 90 tablet 3 06/17/2023   torsemide (DEMADEX) 20 MG tablet Take 20 mg by mouth daily.   06/17/2023   senna-docusate (SENOKOT-S) 8.6-50 MG tablet Take 1-2 tablets by mouth at bedtime. (Patient not taking: Reported on 06/18/2023) 100 tablet 6 Not Taking   Scheduled:   [MAR Hold] atorvastatin  40 mg Oral Daily   [MAR Hold] Chlorhexidine Gluconate Cloth  6 each Topical Daily   [MAR Hold] clopidogrel  75 mg Per NG tube Daily   [MAR Hold] docusate  100 mg Per Tube BID   [MAR Hold] famotidine  20 mg Per Tube Daily   [MAR Hold] feeding supplement (VITAL 1.5 CAL)  1,000 mL Per Tube Q24H   [MAR Hold] fentaNYL (SUBLIMAZE) injection  25 mcg Intravenous Once   [MAR Hold] liver oil-zinc oxide   Topical BID   [MAR Hold] midodrine  5 mg Oral TID WC   nitroGLYCERIN       [MAR Hold] mouth rinse  15 mL Mouth Rinse Q2H   [MAR Hold] polyethylene glycol  17 g Per Tube Daily   [MAR Hold] sodium chloride flush  3 mL Intravenous Q12H   Infusions:   sodium chloride 10 mL/hr at 06/25/23 1500   amiodarone 30 mg/hr (06/25/23 1500)   bivalirudin (ANGIOMAX) 250 mg in sodium chloride 0.9 % 500 mL (0.5 mg/mL) infusion Stopped (06/25/23 1525)   [MAR Hold] ceFEPime (MAXIPIME) IV Stopped (06/25/23 1227)   fentaNYL infusion INTRAVENOUS 200 mcg/hr (06/25/23 1500)   [MAR Hold] norepinephrine (LEVOPHED) Adult infusion 4 mcg/min (06/25/23 1720)   [MAR Hold] vancomycin 150 mL/hr at 06/25/23 1500   PRN: [MAR Hold] acetaminophen, [MAR Hold] fentaNYL, [MAR Hold] fentaNYL (SUBLIMAZE) injection, [MAR Hold] fentaNYL (SUBLIMAZE) injection, fentaNYL, Heparin (Porcine) in NaCl, iohexol, lidocaine (PF), nitroGLYCERIN, nitroGLYCERIN, [MAR Hold] ondansetron (ZOFRAN) IV,  [MAR Hold] mouth rinse, [MAR Hold] senna-docusate, [MAR Hold] sodium chloride flush  Assessment: 25 YOM with PMH of follicular lymphoma in remission, RV failure, severe PAH, HTN, HLD, CAD, Afib/flutter, OSA, chronic lower extremity wounds. Returned to ICU 11/10 with worsening fever, hypotension consistent with septic shock. Patient is also persistently in either AF or Aflutter. Previously patient was on Eliquis and is now on bivalirudin with plans for procedures and history of HIT  aPTT previously therapeutic this morning at 77 on bivalirudin 0.05 mg/kg/hr, CBC stable. Underwent cardiac cath finding severe multivessel CAD - okay to restart bivalirudin now per MD.   Goal of Therapy:  aPTT 50-85 seconds Monitor platelets by anticoagulation protocol: Yes   Plan:  Restart bivalirudin at 0.05 mg/kg/hour  Monitor daily aPTT, CBC, and for s/sx of bleeding  Thank you for allowing pharmacy to participate in this patient's care,  Sherron Monday, PharmD, BCCCP Clinical Pharmacist  Phone: [phone] 06/25/2023 6:16 PM  Please check AMION for all [facility] phone numbers After 10:00 PM, call Main Pharmacy [phone]

## 2023-06-25 NOTE — Progress Notes (Addendum)
Back from cardiac cath  Agitated w/ on-going myoclonus.  Not following commands Still on full vent support  BP 110/69   Pulse (!) 0   Temp 98.8 F (37.1 C) (Axillary)   Resp 11   Ht 6' (1.829 m)   Wt 102.3 kg   SpO2 100%   BMI 30.59 kg/m   Per Dr Royann Shivers  "His cardiac output is 12, c/w septic shock. Coronary anatomy shows that the 2/3 bypasses that were open in 2019 are still wide open. There has been progression of disease in the native left main that feeds the circumflex and ramus, but not with the appearance of an acute coronary event/ruptured plaque. No PCI performed, option to stent left main down the road".   Plan Will add propofol (last QTc was > 500 so hold off on dex for now) to see if we can get better control of his agitation  Repeat 12 lead  Cont NE for MAP > 65 He appears euvolemic from right heart cath numbers ->would aim to keep him that way Cont amio for his AF Cont abx  Simonne Martinet ACNP-BC [facility] Pulmonary/Critical Care Pager # [phone] OR # [phone] if no answer

## 2023-06-25 NOTE — Progress Notes (Signed)
Pt transported from 2H21 to Cath lab by RT and cath lab staff w/o complications

## 2023-06-25 NOTE — Progress Notes (Signed)
PT Cancellation Note  Patient Details Name: [name] MRN: [id number] DOB: 1956-06-05   Cancelled Treatment:    Reason Eval/Treat Not Completed: Medical issues which prohibited therapy. Pt now in ICU and on vent. Will monitor for appropriateness to resume PT.   Angelina Ok [facility] 06/25/2023, 7:58 AM Skip Mayer PT Acute [employer] [phone]

## 2023-06-25 NOTE — Progress Notes (Signed)
PHARMACY - ANTICOAGULATION CONSULT NOTE  Pharmacy Consult for bivalirudin Indication:  atrial flutter  Allergies  Allergen Reactions   Bendamustine Hcl     See hospital notes from 01/12/15, legs turned black   Heparin Other (See Comments)    Heparin induced thrombocytopenia    Bendamustine Hcl Rash    Severe   Tape Rash    If left on for prolong periods of time     Patient Measurements: Height: 6' (182.9 cm) Weight: 102.3 kg (225 lb 8.5 oz) IBW/kg (Calculated) : 77.6 Heparin Dosing Weight: 113.9 kg  Vital Signs: Temp: 98.6 F (37 C) (11/11 0737) Temp Source: Axillary (11/11 0737) BP: 98/63 (11/11 0800) Pulse Rate: 118 (11/11 0800)  Labs: Recent Labs    06/24/23 0301 06/24/23 0551 06/24/23 0556 06/24/23 0840 06/24/23 1011 06/24/23 1159 06/24/23 1645 06/24/23 2208 06/25/23 0331 06/25/23 0409  HGB 8.3*  --  8.0* 9.5*  --   --   --   --   --  7.9*  HCT 30.2*  --  28.7* 28.0*  --   --   --   --   --  26.5*  PLT 186  --  159  --   --   --   --   --   --  209  APTT  --   --   --   --   --   --  68* 77* 77*  --   CREATININE 2.46* 2.64*  --   --   --  2.47*  --   --  1.95*  --   TROPONINIHS  --   --   --   --  22,242* >24,000*  --   --   --   --     Estimated Creatinine Clearance: 44.2 mL/min (A) (by C-G formula based on SCr of 1.95 mg/dL (H)).   Medical History: Past Medical History:  Diagnosis Date   Anxiety    Basal cell carcinoma of nose    Blood dyscrasia    trouble clotting    Carotid artery disease (HCC)    a. s/p L CEA 2009 (hx of evacuation of hematoma due to heparin); b. 01/2022 Carotid U/S: 1-39% bilat stenosis.   Chronic heart failure with preserved ejection fraction (HFpEF) (HCC)    a. 04/2022 Echo: EF 60-65%, no rwma, GrII DD, nl RV fxn, RVSP 47.61mmHg. Mild BAE. Mild MR. Mild-mod TR.   Chronic lower back pain    CKD (chronic kidney disease), stage III (HCC)    COPD (chronic obstructive pulmonary disease) (HCC)    Coronary artery disease    a.  2005 s/p CABG x 3 (LIMA->LAD, VG->OM1, VG->dRCA); b. 09/2011 s/p PCI/DES of the VG->OM1 and VG->dRCA; c. 11/2017 PCI: LM 50ost, LAD 90ost/p, OM1 100 CTO, RCA 100p CTO, VG->dRCA 99 ISR (4.0x22 Resolute Onyx DES), VG->OM1 100 CTO/ISR, LIMA->LAD patent.   Depression    Diabetes mellitus without complication (HCC)    borderline    Dysrhythmia    fluttering   Edema    GERD (gastroesophageal reflux disease)    Heart murmur    Heparin induced thrombocytopenia (HCC)    History of home oxygen therapy    2 liters at night prn   Hyperlipidemia    Hypertension    Lymphoma (HCC) 12/21/2014   Myocardial infarction (HCC)    Normocytic anemia    Obesity    Sleep apnea    a. Not using CPAP.    Medications:  Medications Prior  to Admission  Medication Sig Dispense Refill Last Dose   albuterol (PROVENTIL) (2.5 MG/3ML) 0.083% nebulizer solution INHALE 1 VIAL VIA NEBULIZER EVERY DAY AS NEEDED FOR WHEEZING/SHORTNESS OF BREATH 75 mL 5 Past Month   aspirin EC 81 MG tablet Take 81 mg by mouth every evening.    06/17/2023   aspirin-sod bicarb-citric acid (ALKA-SELTZER) 325 MG TBEF tablet Take 650 mg by mouth every 6 (six) hours as needed (indigestion).   Past Week   clonazePAM (KLONOPIN) 1 MG tablet Take 1 tablet (1 mg total) by mouth at bedtime. 30 tablet 5 06/17/2023   clopidogrel (PLAVIX) 75 MG tablet TAKE 1 TABLET BY MOUTH EVERY DAY WITH BREAKFAST 90 tablet 3 06/18/2023 at AM   metolazone (ZAROXOLYN) 5 MG tablet Take 1 tablet (5 mg total) by mouth daily as needed (swelling). 90 tablet 1 06/17/2023   metoprolol succinate (TOPROL-XL) 25 MG 24 hr tablet Take 0.5 tablets (12.5 mg total) by mouth daily. Take with or immediately following a meal.   06/17/2023   nitroGLYCERIN (NITROSTAT) 0.4 MG SL tablet Place 1 tablet (0.4 mg total) under the tongue every 5 (five) minutes as needed for chest pain (Up to 3 doses). 25 tablet 1 unknown   oxyCODONE-acetaminophen (PERCOCET) 7.5-325 MG tablet Take 1 tablet by mouth every 6  (six) hours as needed for severe pain. 120 tablet 0 06/18/2023   oxymetazoline (AFRIN) 0.05 % nasal spray Place 1 spray into both nostrils 2 (two) times daily as needed for congestion.   unknown   simvastatin (ZOCOR) 20 MG tablet Take 1 tablet (20 mg total) by mouth at bedtime. 90 tablet 3 06/17/2023   torsemide (DEMADEX) 20 MG tablet Take 20 mg by mouth daily.   06/17/2023   senna-docusate (SENOKOT-S) 8.6-50 MG tablet Take 1-2 tablets by mouth at bedtime. (Patient not taking: Reported on 06/18/2023) 100 tablet 6 Not Taking   Scheduled:   Chlorhexidine Gluconate Cloth  6 each Topical Daily   clopidogrel  75 mg Per NG tube Daily   docusate  100 mg Per Tube BID   fentaNYL (SUBLIMAZE) injection  25 mcg Intravenous Once   lactulose  30 g Oral Q4H   lactulose  300 mL Rectal QID   liver oil-zinc oxide   Topical BID   midodrine  5 mg Oral TID WC   mouth rinse  15 mL Mouth Rinse Q2H   polyethylene glycol  17 g Per Tube Daily   potassium chloride  20 mEq Per Tube Q4H   simvastatin  20 mg Oral QHS   sodium chloride flush  3 mL Intravenous Q12H   vancomycin variable dose per unstable renal function (pharmacist dosing)   Does not apply See admin instructions   Infusions:   amiodarone 60 mg/hr (06/25/23 0834)   Followed by   amiodarone     bivalirudin (ANGIOMAX) 250 mg in sodium chloride 0.9 % 500 mL (0.5 mg/mL) infusion 0.05 mg/kg/hr (06/25/23 0700)   ceFEPime (MAXIPIME) IV Stopped (06/24/23 2231)   fentaNYL infusion INTRAVENOUS 200 mcg/hr (06/25/23 0700)   metronidazole Stopped (06/25/23 0216)   norepinephrine (LEVOPHED) Adult infusion 8 mcg/min (06/25/23 0700)   potassium chloride 10 mEq (06/25/23 0731)   PRN: acetaminophen, fentaNYL, fentaNYL (SUBLIMAZE) injection, fentaNYL (SUBLIMAZE) injection, ondansetron (ZOFRAN) IV, mouth rinse, senna-docusate, sodium chloride flush  Assessment: 54 YOM with PMH of follicular lymphoma in remission, RV failure, severe PAH, HTN, HLD, CAD, Afib/flutter, OSA,  chronic lower extremity wounds. Returned to ICU 11/10 with worsening fever, hypotension consistent with  septic shock. Patient is also persistently in either AF or Aflutter. Previously patient was on Eliquis and is now on bivalirudin with plans for procedures and history of HIT  -aPTT= 77 on bivalirudin 0.05 mg/kg/hr, CBC stable  Goal of Therapy:  aPTT 50-85 seconds Monitor platelets by anticoagulation protocol: Yes   Plan:  Continue bivalirudin at 0.05 mg/kg/hour Plans noted for cath today, will follow post procedure  Harland German, PharmD Clinical Pharmacist **Pharmacist phone directory can now be found on [email or web address] ([email or web address]).  Listed under [facility].

## 2023-06-25 NOTE — Progress Notes (Signed)
NAME:  [name], MRN:  [id number], DOB:  [birth year], LOS: 7 ADMISSION DATE:  06/18/2023, CONSULTATION DATE:  06/24/23 REFERRING MD:  Duard Larsen - TRH, CHIEF COMPLAINT:  AMS   History of Present Illness:  67 yo M PMH follicular lymphoma in remission, RV failure, severe PAH g3, HTN, HLD, CAD, Afib/flutter, OSA/OHS, obesity, venous insufficiency, chronic lower extremity wounds who was admitted 11/4 for lower extremity//groin swelling after presenting to his outpt wound care clinic.  It was identified taht pt was not takiing diuretic as Rx. Admitted to HF exacerbation w concomitant AKI, cards consulted and aggressively diuresed 11/4-11/8  On 11/9 the pt was more altered. Ammonia was sent and resulted elevated 129. Lactulose started. Over the course of 11/9-11/10 night shift pt was progressively more lethargic and started to have low BP as well as low-grade temp which was new. 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 Discovered that he wasn't taking diuretic as Rx. AKI, HF exacerbation. Aggressive diuresis started 11/5 - 11/8 ongoing diuresis.  11/9 night AMS, hyperammonemia started lactulose 11/10 early AM hypotension worse AMS. CCM consult, ICU transfer   Interim History / Subjective:  Hemodynamic decline yesterday, required initiation of NE and intubation. Found to have elevated troponin, EKG changes and RWMA consistent with NSTEACS.  For cath today.  Objective   Blood pressure 98/63, pulse (!) 118, temperature 98.6 F (37 C), temperature source Axillary, resp. rate (!) 8, height 6' (1.829  m), weight 102.3 kg, SpO2 96%.    Vent Mode: PRVC FiO2 (%):  [30 %] 30 % Set Rate:  [20 bmp] 20 bmp Vt Set:  [620 mL] 620 mL PEEP:  [5 cmH20] 5 cmH20 Plateau Pressure:  [21 cmH20-25 cmH20] 25 cmH20   Intake/Output Summary (Last 24 hours) at 06/25/2023 0820 Last data filed at 06/25/2023 0700 Gross per 24 hour  Intake 1936.62 ml  Output 2725 ml  Net -788.38 ml   Filed Weights   06/23/23 0345 06/24/23 0703 06/25/23 0430  Weight: 113.9 kg 113.9 kg 102.3 kg    Examination: General: overweight man on mechanical ventilation.  HENT: orally intubated, OGT in place  Lungs: Diffuse wheezing. No crackles. Normal Pplat.  Cardiovascular: tachycardia. Extremities pale with delayed capillary refill. Diffuse apex with RV lift. Loud S1/S2 but no murmur.  Abdomen: soft round.  Extremities: BLE wrapped, edematous, mainly in dependent areas Neuro: tremors to pain.  GU: scrotal edema, external urine collection system in place  Ancillary Tests Personally Reviewed:  Improving creatinine to 1.95 Chronic metabolic alkalosis Markedly elevated BNP >4500 Troponin > 95284 Mild increase in AST/ALT from admission Elevated lactate at 5.5 Assessment & Plan:   Cardiogenic shock secondary to NSTEACS on background of HFpEF with cor pulmonale from COPD/OHS Chronic right heart failure with PAH (group 3) with volume overload and cardiac cirrhosis.  Transaminitis due to worsening hepatic congestion vs shock liver. Aflutter CAD s/p CABG x3, PCI  HLD Hx HIT  Acute encephalopathy - multifactorial, likely shock related.  AKI - due to cardiorenal syndrome.  Frailty with sarcopenia and falls at home  Plan:  - For LHC/RHC today. - Titrate NE  to keep MAP >65 - Transduce CVP and obtain ScvO2 to guide hemodynamic management.  - Currently on nominal ventilator settings - continue full support for now. - Continue amiodarone for rate control.  - Continue bivalirudin and ASA/Plavix for ACS. Switch to high potency  statin.  - Stop antibiotics if cultures negative as suspicion for infection is low.  - Follow creatinine, at high risk for contrast nephropathy.  - Continue fentanyl for comfort. Titrate to RASS -1,-2 - Continue wound care per [facility] RN   Best Practice (right click and "Reselect all SmartList Selections" daily)   Diet/type: NPO - can start feeding today post cath.  DVT prophylaxis: Bivalirudin GI prophylaxis: H2 blocker Lines: Right internal jugular  Foley:  N/A - external catheter.  Code Status:  full code Last date of multidisciplinary goals of care discussion [11/10 ]  CRITICAL CARE Performed by: Lynnell Catalan   Total critical care time: 40 minutes  Critical care time was exclusive of separately billable procedures and treating other patients.  Critical care was necessary to treat or prevent imminent or life-threatening deterioration.  Critical care was time spent personally by me on the following activities: development of treatment plan with patient and/or surrogate as well as nursing, discussions with consultants, evaluation of patient's response to treatment, examination of patient, obtaining history from patient or surrogate, ordering and performing treatments and interventions, ordering and review of laboratory studies, ordering and review of radiographic studies, pulse oximetry, re-evaluation of patient's condition and participation in multidisciplinary rounds.  Lynnell Catalan, MD [facility] ICU Physician [facility] [place] Critical Care  Pager: [phone] Mobile: [phone] After hours: [phone].  06/25/2023, 8:20 AM

## 2023-06-25 NOTE — Progress Notes (Signed)
Patient Name: [name] Date of Encounter: 06/25/2023 [facility] Cardiologist: Charlton Haws, MD   Interval Summary  .    Remains on ventilator, required a slight increase in amount of sedation due to restlessness and tremor. Also remains mildly hypotensive, on low-dose intravenous norepinephrine (8 mics per kilogram per minute). Thick respiratory secretions, but no difficulty oxygenating.  Tmax 99.8 F. Marked increase in urine output overnight (2.7 L net diuresis, net -300 mL last 24 hours) and significant improvement in renal function parameters. High-sensitivity troponin greater than 24,000. Echo shows decrease in LVEF and new inferior wall motion abnormality. CT head does not show evidence of new event. X-ray continues to show bilateral pleural effusions and mild-moderate diffuse bilateral interstitial opacities (opacities actually better when compared with November 4 admission chest x-rays).   Vital Signs .    Vitals:   06/25/23 0730 06/25/23 0737 06/25/23 0745 06/25/23 0800  BP: (!) 108/58  102/61 98/63  Pulse: (!) 108  (!) 107 (!) 118  Resp: 18  20 (!) 8  Temp:  98.6 F (37 C)    TempSrc:  Axillary    SpO2: 95%  96% 96%  Weight:      Height:        Intake/Output Summary (Last 24 hours) at 06/25/2023 0813 Last data filed at 06/25/2023 0700 Gross per 24 hour  Intake 1936.62 ml  Output 2725 ml  Net -788.38 ml      06/25/2023    4:30 AM 06/24/2023    7:03 AM 06/23/2023    3:45 AM  Last 3 Weights  Weight (lbs) 225 lb 8.5 oz 251 lb 1.7 oz 251 lb 1.7 oz  Weight (kg) 102.3 kg 113.9 kg 113.9 kg      Telemetry/ECG    Atrial flutter with variable AV block, now mostly with RVR- Personally Reviewed  Physical Exam .   GEN: No acute distress.   Neck: 10-15 cm JVP (very prominent V waves) Cardiac: RRR with periods of irregularity, 2/6 holosystolic murmur heard best at the left lower sternal border, no systolic murmurs, rubs, or gallops.   Respiratory: Clear to auscultation bilaterally. GI: Soft, nontender, non-distended  MS: Bilateral legs wrapped, no longer with noticeable calf or thigh edema, but does have some lower abdominal wall edema in the presacral and scrotal edema.  Assessment & Plan .     Cardiogenic/septic shock: Presence of fever at the onset of his hemodynamic deterioration suggest a septic shock, but retrospectively there is clearly a component of cardiogenic shock with new decrease in LV function due to acute myocardial infarction. Background severe pulmonary artery hypertension and RV dysfunction make him very vulnerable to hemodynamic failure.  He has very thick secretions and is still possible that he also had aspiration.  He is receiving broad-spectrum antibiotics. Acute on chronic biventricular failure: Admitted with massive volume overload consistent with decompensated right heart failure due to chronic cor pulmonale, but abruptly developed hypotension and respiratory failure in the early hours of 06/24/2023, in the setting of new non-STEMI Acute on chronic hypercapnic respiratory failure: Hypercapnia/hypoventilation documented as far back as 2009.  Suspect he may nowhave a chronic CO2 around 55-60. AKI: Recovery of urine output and improve renal parameters are a very encouraging sign. It does not look like he has ATN, but rather only transient reduction in urine output due to hypotension. Chronic cor pulmonale: Due to COPD and OSA noncompliant with CPAP, very likely also component of obesity hypoventilation syndrome. ABG with severe  hypercapnia, compensatory metabolic alkalosis. Atrial flutter: Had been rate controlled.  Now with RVR in the 120s.  Will start send amiodarone IV infusion since hypotension precludes use of other medications. History of HIT: Using bivalirudin for anticoagulation. History of CAD s/p CABG (2005): Had transient severe ST segment depression in the lateral leads and echo shows an  extensive new inferior/inferoseptal wall motion abnormality with moderate reduction in LV function, all consistent with a new non-STEMI in the distribution of the right coronary artery.  Plan right and left heart catheterization and coronary angiography, hopefully later today, after we discuss this with the family.  On dual antiplatelet therapy with aspirin/clopidogrel and anticoagulation with bivalirudin.  Resume beta-blockers when blood pressure allows. Iron deficiency anemia: Low hemoglobin precedes this admission with hemoglobin around 9-10 throughout this year.  Considering his chronic lung problems he should be polycythemic and for him this probably qualifies as severe anemia.  Labs performed earlier this hospitalization are consistent with iron deficiency.  Yesterday's hemoglobin of 9.5 appears to have been artifactual since over the last 4 days his hemoglobin has been around 8.0 (stable at 7.9 today).  Consider transfusion if hemoglobin drops <7.0.  CRITICAL CARE Performed by: Rachelle Hora [name]   Total critical care time: 45 minutes  Critical care time was exclusive of separately billable procedures and treating other patients.  Critical care was necessary to treat or prevent imminent or life-threatening deterioration.  Critical care was time spent personally by me on the following activities: development of treatment plan with patient and/or surrogate as well as nursing, discussions with consultants, evaluation of patient's response to treatment, examination of patient, obtaining history from patient or surrogate, ordering and performing treatments and interventions, ordering and review of laboratory studies, ordering and review of radiographic studies, pulse oximetry and re-evaluation of patient's condition.    For questions or updates, please contact [facility] Please consult [email or web address] for contact info under        Signed, Thurmon Fair, MD

## 2023-06-25 NOTE — Progress Notes (Signed)
Initial Nutrition Assessment  DOCUMENTATION CODES:   Not applicable  INTERVENTION:  Plan is to start trickle tube feeds via OG tube today after returning from cath lab: -Vital 1.5 at 20 mL/hour  Once appropriate for advancement of tube feeds: -Advance Vital 1.5 by 15 mL/hour every 8 hours to goal rate of 65 mL/hour -Provide PROSource TF20 60 mL daily per tube -Provides: 2420 kcal, 125 grams of protein, 1186 mL H2O daily  NUTRITION DIAGNOSIS:   Inadequate oral intake related to inability to eat as evidenced by NPO status.  GOAL:   Patient will meet greater than or equal to 90% of their needs  MONITOR:   Vent status, Labs, Weight trends, TF tolerance, Skin, I & O's  REASON FOR ASSESSMENT:   Ventilator, Consult Enteral/tube feeding initiation and management (trickle tube feed only)  ASSESSMENT:   67 year old male with PMHx of follicular lymphoma in remission, RV failure, severe PAH, HTN, HLD, CAD, Afib/flutter, OSA/OHS, obesity, venous insufficiency, chronic lower extremity wounds who was admitted on 11/4 for lower extremity/groin swelling after presenting to outpatient wound care clinic found to have HF exacerbation with concomitant AKI.  11/4: aggressive diuresis started 11/8: diuresis stopped 11/9: night AMS, started lactulose for hyperammonemia 11/10: intubated  Pt intubated and sedated. Met with wife at bedside. She reports pt had been very tired for the past 6-8 months and was weak. She reports he also had not been eating as much as he usually did at baseline. She reports most days he would have two meals. He would have breakfast, which may be waffles or egg with bacon/sausage. Then he would have an early light dinner, which may be [place] sandwich or grilled cheese with soup. He also likes chicken, broccoli, and fruit. Wife reports pt was eating fairly well here prior to being intubated. Per chart finishing 50-100% of most meals.   Wife reports that after pt retired he  was at least 330 lbs (150 kg) many years ago. He then had intentional weight loss (less mindless snacking, more physical activity) of around 100 lbs over several years. Wife suspects dry weight was around 230 lbs (104.5 kg). She reports pt had quite a bit of weight gain over the past few months and they did not realize it was fluid he was gaining until he was admitted. Admission wt 118.4 kg (261 lbs). Weight trended down to 113.9 kg on 11/10. Suspect today's weight 102.3 kg is incorrect.   RD strongly suspect pt is malnourished. However, unable to identify due to significant amount of edema present at this time.  Patient is currently intubated on ventilator support MV: 12.9 L/min Temp (24hrs), Avg:99 F (37.2 C), Min:98.1 F (36.7 C), Max:99.6 F (37.6 C)  Medications reviewed and include: Colace 100 mg BID, Miralax 17 grams daily, amiodarone, Angiomax, cefepime, fentanyl gtt, norepinephrine gtt at 8 mcg/min, vancomycin  Labs reviewed: CBG 110-134, Potassium 3.3, Chloride 86, CO2 40, BUN 74, Creatinine 1.95  Enteral Access: 14 Fr. OG tube placed 11/10; terminates in stomach per abdominal x-ray 11/10  UOP: 600 mL (0.2 mL/kg/hr) in previous 24 hrs  I/O: -16059.1 mL since admission  Discussed with RN. Plan to start trickle tube feeds after cath today, just unsure when going to cath.   NUTRITION - FOCUSED PHYSICAL EXAM:  Flowsheet Row Most Recent Value  Orbital Region Mild depletion  Upper Arm Region Unable to assess  [edema]  Thoracic and Lumbar Region No depletion  Buccal Region Unable to assess  [place] Region  Moderate depletion  Clavicle Bone Region Mild depletion  Clavicle and Acromion Bone Region Moderate depletion  Scapular Bone Region Unable to assess  Dorsal Hand Unable to assess  [edema]  Patellar Region Unable to assess  [edema]  Anterior Thigh Region Unable to assess  [edema]  Posterior Calf Region Unable to assess  [dressing and boots for lower extremity wounds, suspect  would also be impacted by edema]  Edema (RD Assessment) Moderate  Hair Reviewed  Eyes Unable to assess  Mouth Unable to assess  Skin Reviewed  Nails Reviewed       Diet Order:   Diet Order     None      EDUCATION NEEDS:   No education needs have been identified at this time  Skin:  Skin Assessment: Skin Integrity Issues: Skin Integrity Issues:: DTI, Other (Comment) DTI: buttocks Other: pressure injury to buttocks, per RN wounds to bilateral lower extremities  Last BM:  06/24/23 per chart  Height:   Ht Readings from Last 1 Encounters:  06/18/23 6' (1.829 m)   Weight:   Wt Readings from Last 1 Encounters:  06/25/23 102.3 kg   Ideal Body Weight:  80.9 kg  BMI:  Body mass index is 30.59 kg/m.  Estimated Nutritional Needs:   Kcal:  2300-2500  Protein:  120-130 grams  Fluid:  2 L/day  Letta Median, MS, RD, LDN, CNSC Pager number available on Amion

## 2023-06-25 NOTE — Progress Notes (Signed)
Pt transported on the ventilator from Cath Lab 3 to 2H21 without complication. M. Stepfon Rawles RRT, A. Psychologist, prison and probation services and  G. Holland RT transported pt.

## 2023-06-26 ENCOUNTER — Encounter (HOSPITAL_COMMUNITY): Payer: Self-pay | Admitting: Cardiology

## 2023-06-26 ENCOUNTER — Inpatient Hospital Stay (HOSPITAL_COMMUNITY): Payer: Medicare Other

## 2023-06-26 DIAGNOSIS — I5033 Acute on chronic diastolic (congestive) heart failure: Secondary | ICD-10-CM | POA: Diagnosis not present

## 2023-06-26 DIAGNOSIS — R570 Cardiogenic shock: Secondary | ICD-10-CM | POA: Diagnosis not present

## 2023-06-26 LAB — POCT I-STAT 7, (LYTES, BLD GAS, ICA,H+H)
Acid-Base Excess: 17 mmol/L — ABNORMAL HIGH (ref 0.0–2.0)
Acid-Base Excess: 17 mmol/L — ABNORMAL HIGH (ref 0.0–2.0)
Bicarbonate: 41.5 mmol/L — ABNORMAL HIGH (ref 20.0–28.0)
Bicarbonate: 41.6 mmol/L — ABNORMAL HIGH (ref 20.0–28.0)
Calcium, Ion: 1.09 mmol/L — ABNORMAL LOW (ref 1.15–1.40)
Calcium, Ion: 1.1 mmol/L — ABNORMAL LOW (ref 1.15–1.40)
HCT: 29 % — ABNORMAL LOW (ref 39.0–52.0)
HCT: 30 % — ABNORMAL LOW (ref 39.0–52.0)
Hemoglobin: 10.2 g/dL — ABNORMAL LOW (ref 13.0–17.0)
Hemoglobin: 9.9 g/dL — ABNORMAL LOW (ref 13.0–17.0)
O2 Saturation: 98 %
O2 Saturation: 98 %
Patient temperature: 36.6
Patient temperature: 97.7
Potassium: 3 mmol/L — ABNORMAL LOW (ref 3.5–5.1)
Potassium: 3 mmol/L — ABNORMAL LOW (ref 3.5–5.1)
Sodium: 139 mmol/L (ref 135–145)
Sodium: 139 mmol/L (ref 135–145)
TCO2: 43 mmol/L — ABNORMAL HIGH (ref 22–32)
TCO2: 43 mmol/L — ABNORMAL HIGH (ref 22–32)
pCO2 arterial: 49.4 mm[Hg] — ABNORMAL HIGH (ref 32–48)
pCO2 arterial: 51.2 mm[Hg] — ABNORMAL HIGH (ref 32–48)
pH, Arterial: 7.517 — ABNORMAL HIGH (ref 7.35–7.45)
pH, Arterial: 7.531 — ABNORMAL HIGH (ref 7.35–7.45)
pO2, Arterial: 93 mm[Hg] (ref 83–108)
pO2, Arterial: 97 mm[Hg] (ref 83–108)

## 2023-06-26 LAB — COMPREHENSIVE METABOLIC PANEL
ALT: 385 U/L — ABNORMAL HIGH (ref 0–44)
AST: 538 U/L — ABNORMAL HIGH (ref 15–41)
Albumin: 2.5 g/dL — ABNORMAL LOW (ref 3.5–5.0)
Alkaline Phosphatase: 71 U/L (ref 38–126)
Anion gap: 9 (ref 5–15)
BUN: 65 mg/dL — ABNORMAL HIGH (ref 8–23)
CO2: 40 mmol/L — ABNORMAL HIGH (ref 22–32)
Calcium: 8.4 mg/dL — ABNORMAL LOW (ref 8.9–10.3)
Chloride: 92 mmol/L — ABNORMAL LOW (ref 98–111)
Creatinine, Ser: 1.6 mg/dL — ABNORMAL HIGH (ref 0.61–1.24)
GFR, Estimated: 46 mL/min — ABNORMAL LOW (ref >60)
Glucose, Bld: 112 mg/dL — ABNORMAL HIGH (ref 70–99)
Potassium: 3.3 mmol/L — ABNORMAL LOW (ref 3.5–5.1)
Sodium: 141 mmol/L (ref 135–145)
Total Bilirubin: 1.9 mg/dL — ABNORMAL HIGH (ref <1.2)
Total Protein: 5.6 g/dL — ABNORMAL LOW (ref 6.5–8.1)

## 2023-06-26 LAB — CBC
HCT: 28.1 % — ABNORMAL LOW (ref 39.0–52.0)
Hemoglobin: 8.7 g/dL — ABNORMAL LOW (ref 13.0–17.0)
MCH: 28.9 pg (ref 26.0–34.0)
MCHC: 31 g/dL (ref 30.0–36.0)
MCV: 93.4 fL (ref 80.0–100.0)
Platelets: 199 10*3/uL (ref 150–400)
RBC: 3.01 MIL/uL — ABNORMAL LOW (ref 4.22–5.81)
RDW: 20.7 % — ABNORMAL HIGH (ref 11.5–15.5)
WBC: 17.9 10*3/uL — ABNORMAL HIGH (ref 4.0–10.5)
nRBC: 0 % (ref 0.0–0.2)

## 2023-06-26 LAB — GLUCOSE, CAPILLARY
Glucose-Capillary: 105 mg/dL — ABNORMAL HIGH (ref 70–99)
Glucose-Capillary: 107 mg/dL — ABNORMAL HIGH (ref 70–99)
Glucose-Capillary: 122 mg/dL — ABNORMAL HIGH (ref 70–99)
Glucose-Capillary: 127 mg/dL — ABNORMAL HIGH (ref 70–99)
Glucose-Capillary: 81 mg/dL (ref 70–99)
Glucose-Capillary: 90 mg/dL (ref 70–99)

## 2023-06-26 LAB — TRIGLYCERIDES: Triglycerides: 71 mg/dL (ref <150)

## 2023-06-26 LAB — APTT: aPTT: 52 s — ABNORMAL HIGH (ref 24–36)

## 2023-06-26 MED ORDER — VITAL 1.5 CAL PO LIQD
1000.0000 mL | ORAL | Status: DC
  Administered 2023-06-26 – 2023-07-01 (×6): 1000 mL

## 2023-06-26 MED ORDER — BUDESONIDE 0.25 MG/2ML IN SUSP
0.2500 mg | Freq: Two times a day (BID) | RESPIRATORY_TRACT | Status: DC
  Administered 2023-06-26 – 2023-07-01 (×11): 0.25 mg via RESPIRATORY_TRACT
  Filled 2023-06-26 (×11): qty 2

## 2023-06-26 MED ORDER — ACETAMINOPHEN 325 MG PO TABS
650.0000 mg | ORAL_TABLET | ORAL | Status: DC | PRN
  Filled 2023-06-26: qty 2

## 2023-06-26 MED ORDER — FUROSEMIDE 10 MG/ML IJ SOLN
60.0000 mg | Freq: Once | INTRAMUSCULAR | Status: AC
  Administered 2023-06-26: 60 mg via INTRAVENOUS
  Filled 2023-06-26: qty 6

## 2023-06-26 MED ORDER — NOREPINEPHRINE 4 MG/250ML-% IV SOLN: 0.0000 ug/min | INTRAVENOUS | Status: DC

## 2023-06-26 MED ORDER — REVEFENACIN 175 MCG/3ML IN SOLN
175.0000 ug | Freq: Every day | RESPIRATORY_TRACT | Status: DC
  Administered 2023-06-26 – 2023-07-01 (×6): 175 ug via RESPIRATORY_TRACT
  Filled 2023-06-26 (×6): qty 3

## 2023-06-26 MED ORDER — MIDODRINE HCL 5 MG PO TABS
5.0000 mg | ORAL_TABLET | Freq: Three times a day (TID) | ORAL | Status: DC
  Administered 2023-06-27 – 2023-06-28 (×4): 5 mg
  Filled 2023-06-26 (×4): qty 1

## 2023-06-26 MED ORDER — SENNOSIDES-DOCUSATE SODIUM 8.6-50 MG PO TABS
1.0000 | ORAL_TABLET | Freq: Two times a day (BID) | ORAL | Status: DC
  Administered 2023-06-26 – 2023-07-01 (×11): 1
  Filled 2023-06-26 (×11): qty 1

## 2023-06-26 MED ORDER — NOREPINEPHRINE 4 MG/250ML-% IV SOLN
0.0000 ug/min | INTRAVENOUS | Status: DC
  Administered 2023-06-27: 21 ug/min via INTRAVENOUS
  Administered 2023-06-27: 17 ug/min via INTRAVENOUS
  Administered 2023-06-27: 20 ug/min via INTRAVENOUS
  Filled 2023-06-26 (×3): qty 250

## 2023-06-26 MED ORDER — ACETAZOLAMIDE 250 MG PO TABS
500.0000 mg | ORAL_TABLET | Freq: Two times a day (BID) | ORAL | Status: DC
  Administered 2023-06-26 – 2023-06-30 (×10): 500 mg
  Filled 2023-06-26 (×11): qty 2

## 2023-06-26 MED ORDER — POTASSIUM CHLORIDE 20 MEQ PO PACK
40.0000 meq | PACK | ORAL | Status: AC
  Administered 2023-06-26 (×2): 40 meq
  Filled 2023-06-26 (×2): qty 2

## 2023-06-26 MED ORDER — PROSOURCE TF20 ENFIT COMPATIBL EN LIQD
60.0000 mL | Freq: Three times a day (TID) | ENTERAL | Status: DC
  Administered 2023-06-26 – 2023-07-01 (×15): 60 mL
  Filled 2023-06-26 (×15): qty 60

## 2023-06-26 MED ORDER — VITAL 1.5 CAL PO LIQD
1000.0000 mL | ORAL | Status: DC
  Administered 2023-06-26: 1000 mL

## 2023-06-26 MED ORDER — POTASSIUM CHLORIDE 20 MEQ PO PACK: 40.0000 meq | PACK | Freq: Once | ORAL | Status: DC

## 2023-06-26 MED ORDER — SENNOSIDES-DOCUSATE SODIUM 8.6-50 MG PO TABS: 1.0000 | ORAL_TABLET | Freq: Every evening | ORAL | Status: DC | PRN

## 2023-06-26 MED ORDER — JUVEN PO PACK
1.0000 | PACK | Freq: Two times a day (BID) | ORAL | Status: DC
  Administered 2023-06-26 – 2023-07-01 (×10): 1
  Filled 2023-06-26 (×10): qty 1

## 2023-06-26 MED ORDER — QUETIAPINE FUMARATE 25 MG PO TABS
25.0000 mg | ORAL_TABLET | Freq: Two times a day (BID) | ORAL | Status: DC
  Administered 2023-06-26 – 2023-06-28 (×5): 25 mg
  Filled 2023-06-26 (×5): qty 1

## 2023-06-26 MED ORDER — CLONAZEPAM 0.5 MG PO TBDP
0.5000 mg | ORAL_TABLET | Freq: Two times a day (BID) | ORAL | Status: DC
  Administered 2023-06-26 – 2023-06-28 (×5): 0.5 mg
  Filled 2023-06-26 (×5): qty 1

## 2023-06-26 MED ORDER — ACETAMINOPHEN 160 MG/5ML PO SOLN
650.0000 mg | ORAL | Status: DC | PRN
  Administered 2023-06-26 – 2023-06-30 (×5): 650 mg
  Filled 2023-06-26 (×5): qty 20.3

## 2023-06-26 NOTE — Progress Notes (Addendum)
NAME:  [name], MRN:  [id number], DOB:  June 17, 1956, LOS: 8 ADMISSION DATE:  06/18/2023, CONSULTATION DATE:  06/24/23 REFERRING MD:  Duard Larsen - TRH, CHIEF COMPLAINT:  AMS   History of Present Illness:  67 yo M PMH follicular lymphoma in remission, RV failure, severe PAH g3, HTN, HLD, CAD, Afib/flutter, OSA/OHS, obesity, venous insufficiency, chronic lower extremity wounds who was admitted 11/4 for lower extremity/groin swelling after presenting to his outpt wound care clinic.  It was identified that pt was not taking diuretic as Rx. Admitted to HF exacerbation w concomitant AKI, cards consulted and aggressively diuresed 11/4-11/8  On 11/9 the pt was more altered. Ammonia was sent and resulted elevated 129. Lactulose started. Over the course of 11/9-11/10 night shift pt was progressively more lethargic and started to have low BP as well as low-grade temp which was new. TRH sent cx, started broad abx.  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 Discovered that he wasn't taking diuretic as Rx. AKI, HF exacerbation. Aggressive diuresis started 11/5 - 11/8 ongoing diuresis.  11/9 night AMS, hyperammonemia started lactulose 11/10 early AM hypotension worse AMS. CCM consult, ICU transfer  11/11 - LHC grafts patent, progression of native disease. LVEDP 27. RHC low SVR, high CI consistent with sepsis, but PCWP still high at 31.   Interim History / Subjective:   Underwent cardiac catheterization yesterday.  Continues to have episodes of shaking when awake and stimulated.   Objective   Blood pressure (!) 111/56, pulse 96, temperature 97.7 F  (36.5 C), resp. rate 18, height 6' (1.829 m), weight 108.8 kg, SpO2 100%. PAP: (42-57)/(22-39) 50/28 CVP:  [7 mmHg-17 mmHg] 13 mmHg CO:  [6.8 L/min-7.4 L/min] 7 L/min CI:  [3 L/min/m2-3.3 L/min/m2] 3.1 L/min/m2  Vent Mode: PRVC FiO2 (%):  [30 %-100 %] 40 % Set Rate:  [20 bmp] 20 bmp Vt Set:  [620 mL] 620 mL PEEP:  [5 cmH20-8 cmH20] 5 cmH20 Plateau Pressure:  [22 cmH20-27 cmH20] 22 cmH20   Intake/Output Summary (Last 24 hours) at 06/26/2023 0726 Last data filed at 06/26/2023 0600 Gross per 24 hour  Intake 2964.71 ml  Output 3260 ml  Net -295.29 ml   Filed Weights   06/24/23 0703 06/25/23 0430 06/26/23 0500  Weight: 113.9 kg 102.3 kg 108.8 kg    Examination: General: overweight man on mechanical ventilation.  HENT: orally intubated, OGT in place  Lungs: persistent wheezing. No crackles. Normal Pplat.  Cardiovascular: tachycardia has improved. Extremities pale with delayed capillary refill. Diffuse apex with RV lift. Loud S1/S2 but no murmur.  Abdomen: soft round.  Extremities: BLE wrapped, edematous, mainly in dependent areas Neuro: tremors to pain.  GU: scrotal edema, external urine collection system in place  Ancillary Tests Personally Reviewed:  Improving creatinine to 1.60 Chronic metabolic alkalosis  BNP >4500 Troponin > 82956 Mild increase in AST/ALT from admission - continues to rise.  Lactate has cleared.  CXR shows essentially clear lung fields.   Assessment & Plan:   Mixed shock picture based on hemodynamics: distributive based on low SVR but likely also cardiogenic shock  component given elevated PCWP.  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  Transaminitis due to worsening hepatic congestion vs shock liver. Aflutter CAD s/p CABG x3, PCI - no evidence of new coronary obstruction.  HLD Hx HIT  Acute encephalopathy -  multifactorial, likely shock related.  AKI - due to cardiorenal syndrome.  Frailty with sarcopenia and falls at home  Plan:  - Continue to titrate NE to keep MAP >65 - As NE requirements are now coming down, start to wean sedation and SBT. - Add bronchodilators. Monitor for worsening tachycardia.  - Continue to diurese given persistently elevated filling pressures.  - Continue amiodarone for rate control. Will need long term anticoagulation.  - Continue bivalirudin and ASA/Plavix for ACS. Switched to high potency statin, but will hold until LFT's improve.  - Complete 7 days of antibiotics empirically. Cultures are negative but hemodynamics are consistent with septic shock.  - Follow creatinine -Wean fentanyl/propofol. Will add enteral sedation to facilitate iv sedation wean. - Continue wound care per WOC RN  Best Practice (right click and "Reselect all SmartList Selections" daily)   Diet/type: Start tube feeds.  DVT prophylaxis: Bivalirudin GI prophylaxis: H2 blocker Lines: Right internal jugular  Foley:  N/A - external catheter.  Code Status:  full code Last date of multidisciplinary goals of care discussion [11/10 ]  CRITICAL CARE Performed by: Lynnell Catalan   Total critical care time: 35 minutes  Critical care time was exclusive of separately billable procedures and treating other patients.  Critical care was necessary to treat or prevent imminent or life-threatening deterioration.  Critical care was time spent personally by me on the following activities: development of treatment plan with patient and/or surrogate as well as nursing, discussions with consultants, evaluation of patient's response to treatment, examination of patient, obtaining history from patient or surrogate, ordering and performing treatments and interventions, ordering and review of laboratory studies, ordering and review of radiographic studies, pulse oximetry, re-evaluation of patient's condition and  participation in multidisciplinary rounds.  Lynnell Catalan, MD [facility] ICU Physician [facility]  Pager: [phone] Mobile: [phone] After hours: [phone].  06/26/2023, 7:26 AM

## 2023-06-26 NOTE — Progress Notes (Signed)
Pt has audible cuff leak that did not resolved by adding air to the cuff. ETT currently at 24. ETT advanced 2cm and re-inflatted, audible cuff leak seems to be resolved at this time. Pt's VS stable throughout.  Will follow up with xray to check ETT placement.

## 2023-06-26 NOTE — Progress Notes (Signed)
Nutrition Follow-up  DOCUMENTATION CODES:   Severe malnutrition in context of chronic illness  INTERVENTION:  Recommend advancing TF: Advance Vital 1.5 to 64ml/hr and advance by 10ml q6h to a goal rate of 83ml/hr ( per day) 60ml ProSource TF 20 TID Provides 2400 kcal, 157g protein, and per day Juven BID per tube to support wound healing  NUTRITION DIAGNOSIS:   Severe Malnutrition related to chronic illness (HF) as evidenced by severe fat depletion, severe muscle depletion, edema. - diagnosis updated 11/12  GOAL:   Patient will meet greater than or equal to 90% of their needs - progressing, addressing via TF  MONITOR:   Vent status, Labs, Weight trends, Skin, TF tolerance  REASON FOR ASSESSMENT:   Ventilator, Consult Enteral/tube feeding initiation and management (trickle tube feed only)  ASSESSMENT:   67 year old male with PMHx of follicular lymphoma in remission, RV failure, severe PAH, HTN, HLD, CAD, Afib/flutter, OSA/OHS, obesity, venous insufficiency, chronic lower extremity wounds who was admitted on 11/4 for lower extremity/groin swelling after presenting to outpatient wound care clinic found to have HF exacerbation with concomitant AKI.  11/4: aggressive diuresis started 11/8: diuresis stopped 11/9: night AMS, started lactulose for hyperammonemia 11/10: intubated 11/11: s/p L/RHC: grafts patent, progression of native disease  Pt remains intubated on vent support.  Plans for Cortrak placement tomorrow 11/13.  RN providing wound care during visit. Noted open blisters to LLE.   TF infusing via OGT (side port gastric) Vital 1.5 at 94ml/hr.  MD agrees to advancement. Orders placed and discussed with RN.   Reviewed admission weight history. 11/5: 119 kg 11/12: 108.8 kg  Noted significant fluctuations in documented weights over the last 3 days. Question accuracy of weight documentations between transfer from progressive to ICU unit, as it is  documented pt with -11.6 kg weight change.  Per I/O's pt significantly negative however with significant pitting edema on exam. Dry weight unknown.   UOP: 3.5L x24 hours   Medications: pepcid, miralax, klor-con q4h, senna BID Drips:  NaCl @ 26ml/hr Fentanyl Levo @ 3mcg/min  Labs: potassium 3.0 (L), BUN 65, Cr 1.60, ionized Ca 1.09, AST 538, ALT 385, Tbili 1.09, GFR 46, CBG's 90-123 x24 hours  NUTRITION - FOCUSED PHYSICAL EXAM:  Flowsheet Row Most Recent Value  Orbital Region Moderate depletion  Upper Arm Region Unable to assess  Thoracic and Lumbar Region Severe depletion  Buccal Region Severe depletion  [place] Region Severe depletion  Clavicle Bone Region Severe depletion  Clavicle and Acromion Bone Region Moderate depletion  Scapular Bone Region Severe depletion  Dorsal Hand Unable to assess  [edema]  Patellar Region Unable to assess  Anterior Thigh Region Unable to assess  [moderate pitting BLE]  Posterior Calf Region Unable to assess  Edema (RD Assessment) Severe  Hair Reviewed  Eyes Unable to assess  Mouth Other (Comment)  [white patches on tongue]  Skin Other (Comment)  [BLE open blisters]  Nails Reviewed       Diet Order:   Diet Order             Diet NPO time specified  Diet effective now                   EDUCATION NEEDS:   No education needs have been identified at this time  Skin:  Skin Assessment: Skin Integrity Issues: Skin Integrity Issues:: DTI, Other (Comment) DTI: buttocks Other: pressure injury to buttocks, per RN wounds to bilateral lower extremities  Last BM:  06/24/23 per chart  Height:   Ht Readings from Last 1 Encounters:  06/18/23 6' (1.829 m)    Weight:   Wt Readings from Last 1 Encounters:  06/26/23 108.8 kg    Ideal Body Weight:  80.9 kg  BMI:  Body mass index is 32.53 kg/m.  Estimated Nutritional Needs:   Kcal:  2300-2500  Protein:  145-160g  Fluid:  2 L/day  Drusilla Kanner, RDN, LDN Clinical  Nutrition

## 2023-06-26 NOTE — Progress Notes (Signed)
PHARMACY - ANTICOAGULATION CONSULT NOTE  Pharmacy Consult for bivalirudin Indication:  atrial flutter  Allergies  Allergen Reactions   Bendamustine Hcl     See hospital notes from 01/12/15, legs turned black   Heparin Other (See Comments)    Heparin induced thrombocytopenia    Bendamustine Hcl Rash    Severe   Tape Rash    If left on for prolong periods of time     Patient Measurements: Height: 6' (182.9 cm) Weight: 108.8 kg (239 lb 13.8 oz) IBW/kg (Calculated) : 77.6 Heparin Dosing Weight: 113.9 kg  Vital Signs: Temp: 97.7 F (36.5 C) (11/12 0600) Temp Source: Core (11/12 0400) BP: 111/56 (11/12 0600) Pulse Rate: 96 (11/12 0600)  Labs: Recent Labs    06/24/23 0556 06/24/23 0840 06/24/23 1011 06/24/23 1159 06/24/23 1645 06/24/23 2208 06/25/23 0331 06/25/23 0409 06/25/23 1719 06/25/23 1728 06/26/23 0414  HGB 8.0*   < >  --   --   --   --   --  7.9* 10.5*  10.9* 10.5*  10.5* 8.7*  HCT 28.7*   < >  --   --   --   --   --  26.5* 31.0*  32.0* 31.0*  31.0* 28.1*  PLT 159  --   --   --   --   --   --  209  --   --  199  APTT  --   --   --   --    < > 77* 77*  --   --   --  52*  CREATININE  --   --   --  2.47*  --   --  1.95*  --   --   --  1.60*  TROPONINIHS  --   --  22,242* >24,000*  --   --   --   --   --   --   --    < > = values in this interval not displayed.    Estimated Creatinine Clearance: 55.5 mL/min (A) (by C-G formula based on SCr of 1.6 mg/dL (H)).   Medical History: Past Medical History:  Diagnosis Date   Anxiety    Basal cell carcinoma of nose    Blood dyscrasia    trouble clotting    Carotid artery disease (HCC)    a. s/p L CEA 2009 (hx of evacuation of hematoma due to heparin); b. 01/2022 Carotid U/S: 1-39% bilat stenosis.   Chronic heart failure with preserved ejection fraction (HFpEF) (HCC)    a. 04/2022 Echo: EF 60-65%, no rwma, GrII DD, nl RV fxn, RVSP 47.51mmHg. Mild BAE. Mild MR. Mild-mod TR.   Chronic lower back pain    CKD  (chronic kidney disease), stage III (HCC)    COPD (chronic obstructive pulmonary disease) (HCC)    Coronary artery disease    a. 2005 s/p CABG x 3 (LIMA->LAD, VG->OM1, VG->dRCA); b. 09/2011 s/p PCI/DES of the VG->OM1 and VG->dRCA; c. 11/2017 PCI: LM 50ost, LAD 90ost/p, OM1 100 CTO, RCA 100p CTO, VG->dRCA 99 ISR (4.0x22 Resolute Onyx DES), VG->OM1 100 CTO/ISR, LIMA->LAD patent.   Depression    Diabetes mellitus without complication (HCC)    borderline    Dysrhythmia    fluttering   Edema    GERD (gastroesophageal reflux disease)    Heart murmur    Heparin induced thrombocytopenia (HCC)    History of home oxygen therapy    2 liters at night prn   Hyperlipidemia  Hypertension    Lymphoma (HCC) 12/21/2014   Myocardial infarction (HCC)    Normocytic anemia    Obesity    Sleep apnea    a. Not using CPAP.    Medications:  Medications Prior to Admission  Medication Sig Dispense Refill Last Dose   albuterol (PROVENTIL) (2.5 MG/3ML) 0.083% nebulizer solution INHALE 1 VIAL VIA NEBULIZER EVERY DAY AS NEEDED FOR WHEEZING/SHORTNESS OF BREATH 75 mL 5 Past Month   aspirin EC 81 MG tablet Take 81 mg by mouth every evening.    06/17/2023   aspirin-sod bicarb-citric acid (ALKA-SELTZER) 325 MG TBEF tablet Take 650 mg by mouth every 6 (six) hours as needed (indigestion).   Past Week   clonazePAM (KLONOPIN) 1 MG tablet Take 1 tablet (1 mg total) by mouth at bedtime. 30 tablet 5 06/17/2023   clopidogrel (PLAVIX) 75 MG tablet TAKE 1 TABLET BY MOUTH EVERY DAY WITH BREAKFAST 90 tablet 3 06/18/2023 at AM   metolazone (ZAROXOLYN) 5 MG tablet Take 1 tablet (5 mg total) by mouth daily as needed (swelling). 90 tablet 1 06/17/2023   metoprolol succinate (TOPROL-XL) 25 MG 24 hr tablet Take 0.5 tablets (12.5 mg total) by mouth daily. Take with or immediately following a meal.   06/17/2023   nitroGLYCERIN (NITROSTAT) 0.4 MG SL tablet Place 1 tablet (0.4 mg total) under the tongue every 5 (five) minutes as needed for  chest pain (Up to 3 doses). 25 tablet 1 unknown   oxyCODONE-acetaminophen (PERCOCET) 7.5-325 MG tablet Take 1 tablet by mouth every 6 (six) hours as needed for severe pain. 120 tablet 0 06/18/2023   oxymetazoline (AFRIN) 0.05 % nasal spray Place 1 spray into both nostrils 2 (two) times daily as needed for congestion.   unknown   simvastatin (ZOCOR) 20 MG tablet Take 1 tablet (20 mg total) by mouth at bedtime. 90 tablet 3 06/17/2023   torsemide (DEMADEX) 20 MG tablet Take 20 mg by mouth daily.   06/17/2023   senna-docusate (SENOKOT-S) 8.6-50 MG tablet Take 1-2 tablets by mouth at bedtime. (Patient not taking: Reported on 06/18/2023) 100 tablet 6 Not Taking   Scheduled:   atorvastatin  40 mg Oral Daily   Chlorhexidine Gluconate Cloth  6 each Topical Daily   clopidogrel  75 mg Per NG tube Daily   docusate  100 mg Per Tube BID   famotidine  20 mg Per Tube Daily   feeding supplement (VITAL 1.5 CAL)  1,000 mL Per Tube Q24H   fentaNYL (SUBLIMAZE) injection  25 mcg Intravenous Once   liver oil-zinc oxide   Topical BID   midodrine  5 mg Oral TID WC   mouth rinse  15 mL Mouth Rinse Q2H   polyethylene glycol  17 g Per Tube Daily   sodium chloride flush  3 mL Intravenous Q12H   Infusions:   sodium chloride 10 mL/hr at 06/26/23 0600   amiodarone 30 mg/hr (06/26/23 0600)   bivalirudin (ANGIOMAX) 250 mg in sodium chloride 0.9 % 500 mL (0.5 mg/mL) infusion 0.05 mg/kg/hr (06/26/23 0600)   ceFEPime (MAXIPIME) IV Stopped (06/25/23 2334)   fentaNYL infusion INTRAVENOUS 175 mcg/hr (06/26/23 0600)   norepinephrine (LEVOPHED) Adult infusion 6 mcg/min (06/26/23 0600)   propofol (DIPRIVAN) infusion 40 mcg/kg/min (06/26/23 0600)   vancomycin 150 mL/hr at 06/25/23 2300   PRN: sodium chloride, acetaminophen, fentaNYL, fentaNYL (SUBLIMAZE) injection, fentaNYL (SUBLIMAZE) injection, ondansetron (ZOFRAN) IV, mouth rinse, senna-docusate, sodium chloride flush  Assessment: 49 YOM with PMH of follicular lymphoma in  remission, RV  failure, severe PAH, HTN, HLD, CAD, Afib/flutter, OSA, chronic lower extremity wounds. Returned to ICU 11/10 with worsening fever, hypotension consistent with septic shock. Patient is also persistently in either AF or Aflutter. Previously patient was on Eliquis and is now on bivalirudin with plans for procedures and history of HIT  Underwent cardiac cath 11/11 finding severe multivessel CAD -aPTT at goal on bivalirudin 0.05 mg/kg/hr  Goal of Therapy:  aPTT 50-85 seconds Monitor platelets by anticoagulation protocol: Yes   Plan:  Continue bivalirudin at 0.05 mg/kg/hour  Monitor daily aPTT, CBC, and for s/sx of bleeding  Thank you for allowing pharmacy to participate in this patient's care,  Harland German, PharmD Clinical Pharmacist **Pharmacist phone directory can now be found on [email or web address] ([email or web address]).  Listed under [facility].

## 2023-06-26 NOTE — Plan of Care (Signed)
  Problem: Clinical Measurements: Goal: Ability to maintain clinical measurements within normal limits will improve Outcome: Progressing   

## 2023-06-26 NOTE — Progress Notes (Signed)
Patient Name: [name] Date of Encounter: 06/26/2023 [facility] Cardiologist: Charlton Haws, MD   Interval Summary  .    Remains intubated, sedated, on a relatively low dose of intravenous norepinephrine for blood pressure support. Excellent urine output, hemodynamically compensated, atrial flutter with controlled rate on intravenous amiodarone. Cardiac catheterization yesterday showed hemodynamics consistent with septic shock with low SVR and very high cardiac output, but also with evidence of significant cardiac dysfunction with elevated left atrial pressure.  Known pre-existing severe pulmonary artery hypertension. Angiography shows progression of left main ostial stenosis, which is probably causing ischemia in the distribution of the non-bypassed left circumflex coronary artery and in the ramus intermedius (bypass is occluded), but he has patent LIMA-LAD and patent stented SVG-RCA. Recent mixed venous O2  80% (despite anemia).  Vital Signs .    Vitals:   06/26/23 0515 06/26/23 0530 06/26/23 0545 06/26/23 0600  BP:  (!) 102/57  (!) 111/56  Pulse: 91 95 96 96  Resp: 11 20 20 18   Temp: 97.9 F (36.6 C) (!) 97.5 F (36.4 C) (!) 97.5 F (36.4 C) 97.7 F (36.5 C)  TempSrc:      SpO2: 100% 100% 100% 100%  Weight:      Height:        Intake/Output Summary (Last 24 hours) at 06/26/2023 0905 Last data filed at 06/26/2023 0848 Gross per 24 hour  Intake 2879.85 ml  Output 3460 ml  Net -580.15 ml      06/26/2023    5:00 AM 06/25/2023    4:30 AM 06/24/2023    7:03 AM  Last 3 Weights  Weight (lbs) 239 lb 13.8 oz 225 lb 8.5 oz 251 lb 1.7 oz  Weight (kg) 108.8 kg 102.3 kg 113.9 kg      Telemetry/ECG    Atrial Flutter with variable AV block, ventricular rate mostly in the 90s- Personally Reviewed   Echocardiogram 06/24/2023    1. Left ventricular ejection fraction, by estimation, is 35 to 40%. The  left ventricle has moderately decreased function.  The left ventricle  demonstrates regional wall motion abnormalities (see scoring  diagram/findings for description). There is  moderate left ventricular hypertrophy. Left ventricular diastolic function  could not be evaluated. There is the interventricular septum is flattened  in systole and diastole, consistent with right ventricular pressure and  volume overload. There is  moderate hypokinesis of the left ventricular, entire inferior wall and  inferoseptal wall.   2. Right ventricular systolic function is severely reduced. The right  ventricular size is severely enlarged.   3. Left atrial size was severely dilated.   4. Right atrial size was severely dilated.   5. Moderate mitral valve regurgitation. No evidence of mitral stenosis.  Moderate mitral annular calcification.   6. Tricuspid valve regurgitation is moderate.   7. The aortic valve is tricuspid. There is mild calcification of the  aortic valve. Aortic valve regurgitation is not visualized. Aortic valve  sclerosis/calcification is present, without any evidence of aortic  stenosis.   Comparison(s): Prior images reviewed side by side. Changes from prior  study are noted. The left ventricular function is significantly worse.    Right and left heart catheterization 06/25/2023  Coronary angiography 06/25/2023: LM: Ostial to distal 70% stenosis (Progressed form 50% stenosis in 2019) LAD: Mid occlusion LIMA-LAD: Patent.  Ostial spasm improved with IA NTG. No stenosis Lcx: Mid 30% disease Ramus: Proximal occlusion SVG-Ramus: Ostial occlusion RCA: Ostial occlusion SVG-PDA: Patent. No significant disease  LVEDP 27 mmHg     Right heart catheterization 06/25/2023: RA: 12 mmHg RV: 59/4 mmHg PA: 60/29 mmHg, mPAP 42 mmHg PCW: 31 mmHg   AO sats: 99% PA sats: 76%   CO: 12.6 L/min CI: 5.6 L/min/m2 SVR: 343 Dynes   Conclusion: Severe multivessel CAD No new unprotected vessel occlusion LM disease appears progressed since  2019 Circulatory shock likely septic in etiology Continue supportive management If and when patient is stable from septic shock standpoint, could consider PCI to protected LM/Lcx   Unusually prolonged procedure time due to difficulty obtaining PA catheter access form Rt CFV, having to switch to Rt internal jugular access. Also, there was significant difficulty accessing all grafts, and the need to use multiple catheters. Physical Exam .   GEN: No acute distress.  Intubated, sedated Neck: 10 cm JVD, hyper pulsatile V waves Cardiac: Mostly regular (atrial flutter, with periods of variable AV block,  no murmurs, rubs, or gallops.  Respiratory: Lateral rhonchi, otherwise clear to auscultation bilaterally. GI: Soft, nontender, non-distended  MS: Improved edema, still has some scrotal swelling and presacral edema  Assessment & Plan .     Septic shock: Elevation in cardiac output (mixed venous O2 80% despite moderate anemia) is consistent with predominantly a septic mechanism for his hypotension.  Norepinephrine is appropriate agent.  Continue antibiotics.  So far no overt source of infection identified (possible suspect include aspiration pneumonia and open wounds on his legs, but neither of these is a clear smoking gun). Cardiogenic shock/NSTEMI: He has had a significant new myocardial infarction and an reduction in LVEF, with elevated left heart filling pressures.  He is diuresing very well.   CAD s/p CABG: Still has 2/3 patent grafts, as he did at his previous cardiac catheterization in 2019.  There has been progression left main coronary artery stenosis.  After he improves, we have an option to perform stents to protected left main coronary artery to improve flow to the left circumflex and ramus intermedius arteries.  I suspect that his infarction may have been related to ischemia in the circumflex coronary artery territory in the setting of septic shock related hypotension. RHF due to [facility]):  Severe, precedes the acute illness, is the cause of his chronic cor pulmonale and right heart failure.  His measured the net diuresis about 17 L since admission.  Weight is down about 10 kg since admission.  Still has signs of hypervolemia. AKI: Rapid improvement in renal parameters.  Note persistent hypokalemia. Shock liver: Hopefully transaminases have peaked.  Bilirubin is already improving.  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  Note marked elevation in serum ammonia before his decompensation. Acute on chronic hypercapnic respiratory failure: Oxygenating well on FiO2 0.3.  Still hypercapnic with marked metabolic alkalosis.  Suspect that his usual CO2 is chronically in the 55-60 range. OSA/COPD/probable obesity hypoventilation syndrome: Mechanism for PAH/chronic cor pulmonale/right heart failure. Persistent atrial flutter: This is probably been present for at least the last 6 months.  We are using amiodarone for rate control due to hypotension/shock.  Revert to beta-blockers once blood pressure allows. Iron deficiency anemia: Precedes this hospitalization.  Cause unclear.  So far no evidence of acute bleeding. 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  Plan to use this for about 72 hours following the acute coronary event (consider stopping tomorrow, will have to discuss best method for DVT/PE prophylaxis).  CRITICAL CARE Performed by: Rachelle Hora Siona Coulston   Total critical care time: 63  minutes  Critical care time was exclusive of separately billable procedures and treating other patients.  Critical care was necessary to treat or prevent imminent or life-threatening deterioration.  Critical care was time spent personally by me on the following activities: development of treatment plan with patient and/or surrogate as well as nursing, discussions with consultants, evaluation of patient's response to treatment, examination of patient, obtaining history  from patient or surrogate, ordering and performing treatments and interventions, ordering and review of laboratory studies, ordering and review of radiographic studies, pulse oximetry and re-evaluation of patient's condition.    For questions or updates, please contact [facility] Please consult [email or web address] for contact info under        Signed, Thurmon Fair, MD

## 2023-06-26 NOTE — Progress Notes (Signed)
PT Cancellation Note  Patient Details Name: [name] MRN: [id number] DOB: 1956-01-27   Cancelled Treatment:    Reason Eval/Treat Not Completed: Patient not medically ready (intubated and not yet medically appropriate)   Kale Rondeau B Dailee Manalang 06/26/2023, 9:16 AM Merryl Hacker, PT Acute Rehabilitation Services Office: [phone]

## 2023-06-26 NOTE — Progress Notes (Addendum)
eLink Physician-Brief Progress Note Patient Name: Kendle Bruster DOB: 04-29-1956 MRN: [id number]   Date of Service  06/26/2023  HPI/Events of Note  retaining urine despite purewick catheter. Can we place indwelling foley??  >800cc NSTEMI, s/p LHC. Septic shock.     eICU Interventions  Ok to go for pure wick for now. Watch for hematurea, avoid truma while inserting catheter, while on blood thinners for NSTEMI.      Intervention Category Intermediate Interventions: Other:  Ranee Gosselin 06/26/2023, 2:35 AM

## 2023-06-27 ENCOUNTER — Inpatient Hospital Stay (HOSPITAL_COMMUNITY): Payer: Medicare Other

## 2023-06-27 ENCOUNTER — Ambulatory Visit (HOSPITAL_BASED_OUTPATIENT_CLINIC_OR_DEPARTMENT_OTHER): Payer: Medicare Other | Admitting: General Surgery

## 2023-06-27 DIAGNOSIS — I5033 Acute on chronic diastolic (congestive) heart failure: Secondary | ICD-10-CM | POA: Diagnosis not present

## 2023-06-27 DIAGNOSIS — E43 Unspecified severe protein-calorie malnutrition: Secondary | ICD-10-CM | POA: Insufficient documentation

## 2023-06-27 DIAGNOSIS — R570 Cardiogenic shock: Secondary | ICD-10-CM | POA: Diagnosis not present

## 2023-06-27 LAB — POCT I-STAT 7, (LYTES, BLD GAS, ICA,H+H)
Acid-Base Excess: 13 mmol/L — ABNORMAL HIGH (ref 0.0–2.0)
Acid-Base Excess: 12 mmol/L — ABNORMAL HIGH (ref 0.0–2.0)
Bicarbonate: 38.3 mmol/L — ABNORMAL HIGH (ref 20.0–28.0)
Bicarbonate: 38 mmol/L — ABNORMAL HIGH (ref 20.0–28.0)
Calcium, Ion: 1.07 mmol/L — ABNORMAL LOW (ref 1.15–1.40)
Calcium, Ion: 1.14 mmol/L — ABNORMAL LOW (ref 1.15–1.40)
HCT: 28 % — ABNORMAL LOW (ref 39.0–52.0)
HCT: 28 % — ABNORMAL LOW (ref 39.0–52.0)
Hemoglobin: 9.5 g/dL — ABNORMAL LOW (ref 13.0–17.0)
Hemoglobin: 9.5 g/dL — ABNORMAL LOW (ref 13.0–17.0)
O2 Saturation: 97 %
O2 Saturation: 99 %
Patient temperature: 37.8
Patient temperature: 37.9
Potassium: 3.8 mmol/L (ref 3.5–5.1)
Potassium: 3.6 mmol/L (ref 3.5–5.1)
Sodium: 139 mmol/L (ref 135–145)
Sodium: 138 mmol/L (ref 135–145)
TCO2: 40 mmol/L — ABNORMAL HIGH (ref 22–32)
TCO2: 40 mmol/L — ABNORMAL HIGH (ref 22–32)
pCO2 arterial: 56.1 mm[Hg] — ABNORMAL HIGH (ref 32–48)
pCO2 arterial: 59.2 mm[Hg] — ABNORMAL HIGH (ref 32–48)
pH, Arterial: 7.446 (ref 7.35–7.45)
pH, Arterial: 7.42 (ref 7.35–7.45)
pO2, Arterial: 99 mm[Hg] (ref 83–108)
pO2, Arterial: 131 mm[Hg] — ABNORMAL HIGH (ref 83–108)

## 2023-06-27 LAB — COOXEMETRY PANEL
Carboxyhemoglobin: 2.2 % — ABNORMAL HIGH (ref 0.5–1.5)
Carboxyhemoglobin: 2.3 % — ABNORMAL HIGH (ref 0.5–1.5)
Methemoglobin: <0.7 % (ref 0.0–1.5)
Methemoglobin: <0.7 % (ref 0.0–1.5)
O2 Saturation: 73.1 %
O2 Saturation: 76.3 %
Total hemoglobin: 8.5 g/dL — ABNORMAL LOW (ref 12.0–16.0)
Total hemoglobin: 8.5 g/dL — ABNORMAL LOW (ref 12.0–16.0)

## 2023-06-27 LAB — COMPREHENSIVE METABOLIC PANEL
ALT: 348 U/L — ABNORMAL HIGH (ref 0–44)
AST: 375 U/L — ABNORMAL HIGH (ref 15–41)
Albumin: 2.3 g/dL — ABNORMAL LOW (ref 3.5–5.0)
Alkaline Phosphatase: 67 U/L (ref 38–126)
Anion gap: 8 (ref 5–15)
BUN: 64 mg/dL — ABNORMAL HIGH (ref 8–23)
CO2: 37 mmol/L — ABNORMAL HIGH (ref 22–32)
Calcium: 8 mg/dL — ABNORMAL LOW (ref 8.9–10.3)
Chloride: 95 mmol/L — ABNORMAL LOW (ref 98–111)
Creatinine, Ser: 1.63 mg/dL — ABNORMAL HIGH (ref 0.61–1.24)
GFR, Estimated: 45 mL/min — ABNORMAL LOW (ref >60)
Glucose, Bld: 122 mg/dL — ABNORMAL HIGH (ref 70–99)
Potassium: 3.4 mmol/L — ABNORMAL LOW (ref 3.5–5.1)
Sodium: 140 mmol/L (ref 135–145)
Total Bilirubin: 1.6 mg/dL — ABNORMAL HIGH (ref <1.2)
Total Protein: 5.2 g/dL — ABNORMAL LOW (ref 6.5–8.1)

## 2023-06-27 LAB — GLUCOSE, CAPILLARY
Glucose-Capillary: 105 mg/dL — ABNORMAL HIGH (ref 70–99)
Glucose-Capillary: 113 mg/dL — ABNORMAL HIGH (ref 70–99)
Glucose-Capillary: 116 mg/dL — ABNORMAL HIGH (ref 70–99)
Glucose-Capillary: 119 mg/dL — ABNORMAL HIGH (ref 70–99)
Glucose-Capillary: 136 mg/dL — ABNORMAL HIGH (ref 70–99)
Glucose-Capillary: 156 mg/dL — ABNORMAL HIGH (ref 70–99)
Glucose-Capillary: 179 mg/dL — ABNORMAL HIGH (ref 70–99)

## 2023-06-27 LAB — CBC
HCT: 26.7 % — ABNORMAL LOW (ref 39.0–52.0)
Hemoglobin: 8.3 g/dL — ABNORMAL LOW (ref 13.0–17.0)
MCH: 29.4 pg (ref 26.0–34.0)
MCHC: 31.1 g/dL (ref 30.0–36.0)
MCV: 94.7 fL (ref 80.0–100.0)
Platelets: 157 10*3/uL (ref 150–400)
RBC: 2.82 MIL/uL — ABNORMAL LOW (ref 4.22–5.81)
RDW: 21.6 % — ABNORMAL HIGH (ref 11.5–15.5)
WBC: 24.7 10*3/uL — ABNORMAL HIGH (ref 4.0–10.5)
nRBC: 0 % (ref 0.0–0.2)

## 2023-06-27 LAB — APTT: aPTT: 71 s — ABNORMAL HIGH (ref 24–36)

## 2023-06-27 LAB — CG4 I-STAT (LACTIC ACID)
Lactic Acid, Venous: 0.8 mmol/L (ref 0.5–1.9)
Lactic Acid, Venous: 0.8 mmol/L (ref 0.5–1.9)

## 2023-06-27 LAB — LIPOPROTEIN A (LPA): Lipoprotein (a): <8.4 nmol/L (ref <75.0)

## 2023-06-27 MED ORDER — VASOPRESSIN 20 UNITS/100 ML INFUSION FOR SHOCK
0.0000 [IU]/min | INTRAVENOUS | Status: DC
  Administered 2023-06-27 – 2023-06-28 (×3): 0.03 [IU]/min via INTRAVENOUS
  Administered 2023-06-29: 0.02 [IU]/min via INTRAVENOUS
  Filled 2023-06-27 (×5): qty 100

## 2023-06-27 MED ORDER — HYDROCORTISONE SOD SUC (PF) 100 MG IJ SOLR
100.0000 mg | Freq: Two times a day (BID) | INTRAMUSCULAR | Status: DC
  Administered 2023-06-27 – 2023-07-01 (×8): 100 mg via INTRAVENOUS
  Filled 2023-06-27 (×8): qty 2

## 2023-06-27 MED ORDER — NOREPINEPHRINE 16 MG/250ML-% IV SOLN
0.0000 ug/min | INTRAVENOUS | Status: DC
  Administered 2023-06-27: 20 ug/min via INTRAVENOUS
  Administered 2023-06-27: 25 ug/min via INTRAVENOUS
  Administered 2023-06-28: 11 ug/min via INTRAVENOUS
  Administered 2023-06-30: 2 ug/min via INTRAVENOUS
  Filled 2023-06-27 (×4): qty 250

## 2023-06-27 MED ORDER — DEXMEDETOMIDINE HCL IN NACL 400 MCG/100ML IV SOLN
0.0000 ug/kg/h | INTRAVENOUS | Status: DC
  Administered 2023-06-27: 0.198 ug/kg/h via INTRAVENOUS
  Administered 2023-06-27: 0.1 ug/kg/h via INTRAVENOUS
  Administered 2023-06-28: 0.2 ug/kg/h via INTRAVENOUS
  Filled 2023-06-27 (×3): qty 100

## 2023-06-27 MED ORDER — SODIUM CHLORIDE 0.9 % IV BOLUS
500.0000 mL | Freq: Once | INTRAVENOUS | Status: AC
  Administered 2023-06-27: 500 mL via INTRAVENOUS

## 2023-06-27 MED ORDER — POTASSIUM CHLORIDE 20 MEQ PO PACK
40.0000 meq | PACK | Freq: Once | ORAL | Status: AC
  Administered 2023-06-27: 40 meq
  Filled 2023-06-27: qty 2

## 2023-06-27 NOTE — Progress Notes (Signed)
Patient Name: [name] Date of Encounter: 06/27/2023 [facility] Cardiologist: Charlton Haws, MD   Interval Summary  .    We have not really made any progress overnight.  He has required more sedation for agitation and is now on a higher dose of intravenous norepinephrine (20 mcg) for blood pressure support. Cardiac index remains high (4.0 L/min/msq by thermodilution, mixed venous O2 73%), and PAWP around 28 mmHg. Worsened RVR in the 120s despite IV amiodarone.  Atrial flutter with variable AV block and occasional PVCs, ventricular rate in the 120s Oxygenating well.  Renal function stable.  Excellent urine output, but +1 L in last 24 hours. WBC increased to 24K.  Hemoglobin stable. Today is day #4 of antibiotics (cefepime/Vanco)  Vital Signs .    Vitals:   06/27/23 0730 06/27/23 0745 06/27/23 0751 06/27/23 0815  BP:    (!) 99/48  Pulse:  (!) 122  (!) 127  Resp: 20 (!) 23  20  Temp: 100 F (37.8 C) 100 F (37.8 C)  100 F (37.8 C)  TempSrc:   Core   SpO2:  98%  97%  Weight:      Height:        Intake/Output Summary (Last 24 hours) at 06/27/2023 0902 Last data filed at 06/27/2023 0700 Gross per 24 hour  Intake 3536.32 ml  Output 2910 ml  Net 626.32 ml      06/27/2023    5:00 AM 06/26/2023    5:00 AM 06/25/2023    4:30 AM  Last 3 Weights  Weight (lbs) 244 lb 11.4 oz 239 lb 13.8 oz 225 lb 8.5 oz  Weight (kg) 111 kg 108.8 kg 102.3 kg      Telemetry/ECG    Atrial flutter with variable AV block and PVCs- Personally Reviewed      Echocardiogram 06/24/2023    1. Left ventricular ejection fraction, by estimation, is 35 to 40%. The  left ventricle has moderately decreased function. The left ventricle  demonstrates regional wall motion abnormalities (see scoring  diagram/findings for description). There is  moderate left ventricular hypertrophy. Left ventricular diastolic function  could not be evaluated. There is the interventricular septum  is flattened  in systole and diastole, consistent with right ventricular pressure and  volume overload. There is  moderate hypokinesis of the left ventricular, entire inferior wall and  inferoseptal wall.   2. Right ventricular systolic function is severely reduced. The right  ventricular size is severely enlarged.   3. Left atrial size was severely dilated.   4. Right atrial size was severely dilated.   5. Moderate mitral valve regurgitation. No evidence of mitral stenosis.  Moderate mitral annular calcification.   6. Tricuspid valve regurgitation is moderate.   7. The aortic valve is tricuspid. There is mild calcification of the  aortic valve. Aortic valve regurgitation is not visualized. Aortic valve  sclerosis/calcification is present, without any evidence of aortic  stenosis.   Comparison(s): Prior images reviewed side by side. Changes from prior  study are noted. The left ventricular function is significantly worse.      Right and left heart catheterization 06/25/2023   Coronary angiography 06/25/2023: LM: Ostial to distal 70% stenosis (Progressed form 50% stenosis in 2019) LAD: Mid occlusion LIMA-LAD: Patent.  Ostial spasm improved with IA NTG. No stenosis Lcx: Mid 30% disease Ramus: Proximal occlusion SVG-Ramus: Ostial occlusion RCA: Ostial occlusion SVG-PDA: Patent. No significant disease   LVEDP 27 mmHg     Right heart  catheterization 06/25/2023: RA: 12 mmHg RV: 59/4 mmHg PA: 60/29 mmHg, mPAP 42 mmHg PCW: 31 mmHg   AO sats: 99% PA sats: 76%   CO: 12.6 L/min CI: 5.6 L/min/m2 SVR: 343 Dynes   Conclusion: Severe multivessel CAD No new unprotected vessel occlusion LM disease appears progressed since 2019 Circulatory shock likely septic in etiology Continue supportive management If and when patient is stable from septic shock standpoint, could consider PCI to protected LM/Lcx   Unusually prolonged procedure time due to difficulty obtaining PA catheter  access form Rt CFV, having to switch to Rt internal jugular access. Also, there was significant difficulty accessing all grafts, and the need to use multiple catheters.  Physical Exam .   GEN: No acute distress.   Neck: No JVD Cardiac: Irregular , 1/6 holosystolic murmur left lower sternal border, no diastolic murmurs, rubs, or gallops.  Respiratory: Clear to auscultation bilaterally. GI: Soft, nontender, non-distended  MS: Mild peripheral edema  Assessment & Plan .     Septic shock: Continues to have evidence of elevated cardiac output, with sepsis as a major contributor to his shock.  Increasing WBC suggest ongoing infection.  Can also consider possible contribution of chronic liver disease/cirrhosis to high CO, since he has had longstanding right heart failure and liver congestion.  Norepinephrine (+/- phenylephrine) is the appropriate agent.  Continue antibiotics.  So far no overt source of infection identified (possible suspect include aspiration pneumonia and open wounds on his legs).  Lines were placed following his onset of shock. Cardiogenic shock/NSTEMI: He has had a significant new myocardial infarction and an reduction in LVEF, with elevated left heart filling pressures.  Has evidence of elevated left heart pressures (wedge pressure 31 at the time of catheterization, now appears to be in high 20s).   CAD s/p CABG: Still has 2/3 patent grafts, as he did at his previous cardiac catheterization in 2019.  There has been progression left main coronary artery stenosis.  After he improves, we have an option to perform stents to protected left main coronary artery to improve flow to the left circumflex and ramus intermedius arteries.  I suspect that his infarction may have been related to ischemia in the circumflex coronary artery territory in the setting of septic shock related hypotension. RHF due to [facility]): Severe, precedes the acute illness, is the cause of his chronic cor pulmonale and  right heart failure.  Net diuresis 15.5 L since admission (he has gained back about 1.5 L since ICU admission).  Weight is down about 8 kg since admission, has gained back 2 kg in the last 48 hours.  Still has signs of hypervolemia. AKI: Creatinine improved from peak, still appears to be worse than his baseline. Shock liver: Disseminated does have peaked and are improving.  Bilirubin also continues to improve.  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  Note marked elevation in serum ammonia before his decompensation (this may have contributed to encephalopathy and aspiration?). Acute on chronic hypercapnic respiratory failure: Oxygenating well on FiO2 0.4.  Still hypercapnic with marked metabolic alkalosis.  Suspect that his usual CO2 is chronically in the 55-60 range. OSA/COPD/probable obesity hypoventilation syndrome: Mechanism for PAH/chronic cor pulmonale/right heart failure. Persistent atrial flutter: Heart rate control has worsened in the last 24 hours.  Increased amiodarone to 60 mL/h for the next 6 hours and then reassess. Iron deficiency anemia: Precedes this hospitalization.  Cause unclear.  So far no evidence of acute bleeding.  Hemoglobin  stable. 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  Planned to use this for about 72 hours following the acute coronary event (from ACS point of view we could consider stopping today, will continue for the time being for atrial fibrillation and DVT/PE prophylaxis, as there is still the possibility that he will require invasive/surgical interventions).  CRITICAL CARE Performed by: Rachelle Hora Talayia Hjort   Total critical care time: 60 minutes  Critical care time was exclusive of separately billable procedures and treating other patients.  Critical care was necessary to treat or prevent imminent or life-threatening deterioration.  Critical care was time spent personally by me on the following activities: development  of treatment plan with patient and/or surrogate as well as nursing, discussions with consultants, evaluation of patient's response to treatment, examination of patient, obtaining history from patient or surrogate, ordering and performing treatments and interventions, ordering and review of laboratory studies, ordering and review of radiographic studies, pulse oximetry and re-evaluation of patient's condition.   For questions or updates, please contact [facility] Please consult [email or web address] for contact info under        Signed, Thurmon Fair, MD

## 2023-06-27 NOTE — Progress Notes (Signed)
NAME:  [name], MRN:  [id number], DOB:  07/24/1956, LOS: 9 ADMISSION DATE:  06/18/2023, CONSULTATION DATE:  06/24/23 REFERRING MD:  Duard Larsen - TRH, CHIEF COMPLAINT:  AMS   History of Present Illness:  67 yo M PMH follicular lymphoma in remission, RV failure, severe PAH g3, HTN, HLD, CAD, Afib/flutter, OSA/OHS, obesity, venous insufficiency, chronic lower extremity wounds who was admitted 11/4 for lower extremity/groin swelling after presenting to his outpt wound care clinic.  It was identified that pt was not taking diuretic as Rx. Admitted to HF exacerbation w concomitant AKI, cards consulted and aggressively diuresed 11/4-11/8  On 11/9 the pt was more altered. Ammonia was sent and resulted elevated 129. Lactulose started. Over the course of 11/9-11/10 night shift pt was progressively more lethargic and started to have low BP as well as low-grade temp which was new. TRH sent cx, started broad abx.  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 Discovered that he wasn't taking diuretic as Rx. AKI, HF exacerbation. Aggressive diuresis started 11/5 - 11/8 ongoing diuresis.  11/9 night AMS, hyperammonemia started lactulose 11/10 early AM hypotension worse AMS. CCM consult, ICU transfer  11/11 - LHC grafts patent, progression of native disease. LVEDP 27. RHC low SVR, high CI consistent with sepsis, but PCWP still high at 31.   Interim History / Subjective:   Increased vasopressor requirements overnight. Still not following commands with sedation weaning.   Objective   Blood pressure (!) 99/54, pulse (!) 208, temperature 99.7 F (37.6 C),  resp. rate (!) 23, height 6' (1.829 m), weight 111 kg, SpO2 99%. PAP: (40-55)/(24-42) 44/32 CVP:  [2 mmHg-27 mmHg] 8 mmHg CO:  [7.1 L/min-8 L/min] 7.9 L/min CI:  [3.2 L/min/m2-3.6 L/min/m2] 3.5 L/min/m2  Vent Mode: PRVC FiO2 (%):  [40 %] 40 % Set Rate:  [20 bmp] 20 bmp Vt Set:  [620 mL] 620 mL PEEP:  [5 cmH20] 5 cmH20 Plateau Pressure:  [20 cmH20-23 cmH20] 20 cmH20   Intake/Output Summary (Last 24 hours) at 06/27/2023 0743 Last data filed at 06/27/2023 0700 Gross per 24 hour  Intake 3912.56 ml  Output 3060 ml  Net 852.56 ml   Filed Weights   06/25/23 0430 06/26/23 0500 06/27/23 0500  Weight: 102.3 kg 108.8 kg 111 kg    Examination: General: overweight man on mechanical ventilation.  HENT: orally intubated, OGT in place  Lungs: Wheezing much improved. No crackles. Normal Pplat.  Cardiovascular: tachycardic again in afib. Extremities are warmer.  Diffuse apex with RV lift. Loud S1/S2 but no murmur.  Abdomen: soft round. BS are present.  Extremities: BLE wrapped, edematous, mainly in dependent areas. ++ bruising.  Neuro: tremors to pain. Opens eyes to voice but not following commands.  GU: scrotal edema much improved, external urine collection system in place  Ancillary Tests Personally Reviewed:  Stable creatinine at 1.63 Chronic metabolic alkalosis - CO2 improving with acetazolamide 37 ScvO2 73 Mild increase in AST/ALT from admission - coming back down. CXR shows essentially clear lung fields.  Worsening leukocytosis.  Assessment & Plan:   Mixed shock picture based on hemodynamics: distributive based on low SVR but likely also cardiogenic shock  component given elevated PCWP.  Demand related NSTEACS on background of HFpEF with cor pulmonale from COPD/OHS Chronic right heart failure with PAH (group 3) with volume overload and cardiac cirrhosis.  Transaminitis due to worsening hepatic congestion vs shock liver. Aflutter CAD s/p CABG x3, PCI - no evidence of new coronary  obstruction.  HLD Hx HIT  Acute encephalopathy - multifactorial, likely shock related.  AKI - due to cardiorenal syndrome.  Frailty with sarcopenia and falls at home  Plan:  - Continue to titrate NE to keep MAP >65 - Switch to Precedex and attempt come off propofol and fentanyl.  - Continue enteral sedation.  - Attempt SBT today, mental status likely to be major barrier.  - Increase amiodarone to deal with tachycardia - Continue Yupelri.  - Hold further diuresis given increased pressor requirements.  While left-sided pressures are high the RV appears to be quite preload dependent.  - May be able to diurese further once extubated which might help RV.  - Continue bivalirudin for Afib. Switched to high potency statin, but will hold until LFT's improve.  - Complete 7 days of antibiotics empirically. Cultures are negative but hemodynamics are consistent with septic shock.  Follow leukocytosis.  Will hold on further imaging for now and focus on trying to extubate.  - Follow creatinine - Continue wound care per WOC RN  Best Practice (right click and "Reselect all SmartList Selections" daily)   Diet/type: Start tube feeds.  DVT prophylaxis: Bivalirudin GI prophylaxis: H2 blocker Lines: Right internal jugular  Foley:  N/A - external catheter.  Code Status:  full code Last date of multidisciplinary goals of care discussion [11/10 ]  CRITICAL CARE Performed by: Lynnell Catalan   Total critical care time: 35 minutes  Critical care time was exclusive of separately billable procedures and treating other patients.  Critical care was necessary to treat or prevent imminent or life-threatening deterioration.  Critical care was time spent personally by me on the following activities: development of treatment plan with patient and/or surrogate as well as nursing, discussions with consultants, evaluation of patient's response to treatment, examination of patient, obtaining history from patient or  surrogate, ordering and performing treatments and interventions, ordering and review of laboratory studies, ordering and review of radiographic studies, pulse oximetry, re-evaluation of patient's condition and participation in multidisciplinary rounds.  Lynnell Catalan, MD [facility] ICU Physician [facility]  Pager: [phone] Mobile: [phone] After hours: [phone].  06/27/2023, 7:43 AM

## 2023-06-27 NOTE — Progress Notes (Signed)
PHARMACY - ANTICOAGULATION CONSULT NOTE  Pharmacy Consult for bivalirudin Indication:  atrial flutter  Allergies  Allergen Reactions   Bendamustine Hcl     See hospital notes from 01/12/15, legs turned black   Heparin Other (See Comments)    Heparin induced thrombocytopenia    Bendamustine Hcl Rash    Severe   Tape Rash    If left on for prolong periods of time     Patient Measurements: Height: 6' (182.9 cm) Weight: 111 kg (244 lb 11.4 oz) IBW/kg (Calculated) : 77.6 Heparin Dosing Weight: 113.9 kg  Vital Signs: Temp: 100 F (37.8 C) (11/13 0815) Temp Source: Core (11/13 0751) BP: 99/48 (11/13 0815) Pulse Rate: 127 (11/13 0815)  Labs: Recent Labs     0000 06/24/23 1011 06/24/23 1159 06/24/23 1645 06/25/23 0331 06/25/23 0409 06/25/23 1719 06/26/23 0414 06/26/23 0758 06/27/23 0242 06/27/23 0809 06/27/23 0831  HGB  --   --   --   --   --  7.9*   < > 8.7*   < > 8.3* 9.5* 9.5*  HCT  --   --   --   --   --  26.5*   < > 28.1*   < > 26.7* 28.0* 28.0*  PLT  --   --   --   --   --  209  --  199  --  157  --   --   APTT  --   --   --    < > 77*  --   --  52*  --  71*  --   --   CREATININE   < >  --  2.47*  --  1.95*  --   --  1.60*  --  1.63*  --   --   TROPONINIHS  --  22,242* >24,000*  --   --   --   --   --   --   --   --   --    < > = values in this interval not displayed.    Estimated Creatinine Clearance: 55.1 mL/min (A) (by C-G formula based on SCr of 1.63 mg/dL (H)).   Medical History: Past Medical History:  Diagnosis Date   Anxiety    Basal cell carcinoma of nose    Blood dyscrasia    trouble clotting    Carotid artery disease (HCC)    a. s/p L CEA 2009 (hx of evacuation of hematoma due to heparin); b. 01/2022 Carotid U/S: 1-39% bilat stenosis.   Chronic heart failure with preserved ejection fraction (HFpEF) (HCC)    a. 04/2022 Echo: EF 60-65%, no rwma, GrII DD, nl RV fxn, RVSP 47.39mmHg. Mild BAE. Mild MR. Mild-mod TR.   Chronic lower back pain    CKD  (chronic kidney disease), stage III (HCC)    COPD (chronic obstructive pulmonary disease) (HCC)    Coronary artery disease    a. 2005 s/p CABG x 3 (LIMA->LAD, VG->OM1, VG->dRCA); b. 09/2011 s/p PCI/DES of the VG->OM1 and VG->dRCA; c. 11/2017 PCI: LM 50ost, LAD 90ost/p, OM1 100 CTO, RCA 100p CTO, VG->dRCA 99 ISR (4.0x22 Resolute Onyx DES), VG->OM1 100 CTO/ISR, LIMA->LAD patent.   Depression    Diabetes mellitus without complication (HCC)    borderline    Dysrhythmia    fluttering   Edema    GERD (gastroesophageal reflux disease)    Heart murmur    Heparin induced thrombocytopenia (HCC)    History of home oxygen therapy  2 liters at night prn   Hyperlipidemia    Hypertension    Lymphoma (HCC) 12/21/2014   Myocardial infarction (HCC)    Normocytic anemia    Obesity    Sleep apnea    a. Not using CPAP.    Medications:  Medications Prior to Admission  Medication Sig Dispense Refill Last Dose   albuterol (PROVENTIL) (2.5 MG/3ML) 0.083% nebulizer solution INHALE 1 VIAL VIA NEBULIZER EVERY DAY AS NEEDED FOR WHEEZING/SHORTNESS OF BREATH 75 mL 5 Past Month   aspirin EC 81 MG tablet Take 81 mg by mouth every evening.    06/17/2023   aspirin-sod bicarb-citric acid (ALKA-SELTZER) 325 MG TBEF tablet Take 650 mg by mouth every 6 (six) hours as needed (indigestion).   Past Week   clonazePAM (KLONOPIN) 1 MG tablet Take 1 tablet (1 mg total) by mouth at bedtime. 30 tablet 5 06/17/2023   clopidogrel (PLAVIX) 75 MG tablet TAKE 1 TABLET BY MOUTH EVERY DAY WITH BREAKFAST 90 tablet 3 06/18/2023 at AM   metolazone (ZAROXOLYN) 5 MG tablet Take 1 tablet (5 mg total) by mouth daily as needed (swelling). 90 tablet 1 06/17/2023   metoprolol succinate (TOPROL-XL) 25 MG 24 hr tablet Take 0.5 tablets (12.5 mg total) by mouth daily. Take with or immediately following a meal.   06/17/2023   nitroGLYCERIN (NITROSTAT) 0.4 MG SL tablet Place 1 tablet (0.4 mg total) under the tongue every 5 (five) minutes as needed for  chest pain (Up to 3 doses). 25 tablet 1 unknown   oxyCODONE-acetaminophen (PERCOCET) 7.5-325 MG tablet Take 1 tablet by mouth every 6 (six) hours as needed for severe pain. 120 tablet 0 06/18/2023   oxymetazoline (AFRIN) 0.05 % nasal spray Place 1 spray into both nostrils 2 (two) times daily as needed for congestion.   unknown   simvastatin (ZOCOR) 20 MG tablet Take 1 tablet (20 mg total) by mouth at bedtime. 90 tablet 3 06/17/2023   torsemide (DEMADEX) 20 MG tablet Take 20 mg by mouth daily.   06/17/2023   senna-docusate (SENOKOT-S) 8.6-50 MG tablet Take 1-2 tablets by mouth at bedtime. (Patient not taking: Reported on 06/18/2023) 100 tablet 6 Not Taking   Scheduled:   acetaZOLAMIDE  500 mg Per Tube BID   budesonide (PULMICORT) nebulizer solution  0.25 mg Nebulization BID   Chlorhexidine Gluconate Cloth  6 each Topical Daily   clonazepam  0.5 mg Per Tube BID   clopidogrel  75 mg Per NG tube Daily   famotidine  20 mg Per Tube Daily   feeding supplement (PROSource TF20)  60 mL Per Tube TID   fentaNYL (SUBLIMAZE) injection  25 mcg Intravenous Once   liver oil-zinc oxide   Topical BID   midodrine  5 mg Per Tube TID WC   nutrition supplement (JUVEN)  1 packet Per Tube BID BM   mouth rinse  15 mL Mouth Rinse Q2H   polyethylene glycol  17 g Per Tube Daily   QUEtiapine  25 mg Per Tube BID   revefenacin  175 mcg Nebulization Daily   senna-docusate  1 tablet Per Tube BID   sodium chloride flush  3 mL Intravenous Q12H   Infusions:   amiodarone 60 mg/hr (06/27/23 0907)   bivalirudin (ANGIOMAX) 250 mg in sodium chloride 0.9 % 500 mL (0.5 mg/mL) infusion 0.05 mg/kg/hr (06/27/23 0900)   ceFEPime (MAXIPIME) IV 2 g (06/27/23 0905)   dexmedetomidine (PRECEDEX) IV infusion 0.1 mcg/kg/hr (06/27/23 0900)   feeding supplement (VITAL 1.5 CAL) 60  mL/hr at 06/27/23 0900   fentaNYL infusion INTRAVENOUS 25 mcg/hr (06/27/23 0900)   norepinephrine (LEVOPHED) Adult infusion 21 mcg/min (06/27/23 0900)   propofol  (DIPRIVAN) infusion 10 mcg/kg/min (06/27/23 0900)   PRN: acetaminophen, fentaNYL, fentaNYL (SUBLIMAZE) injection, fentaNYL (SUBLIMAZE) injection, ondansetron (ZOFRAN) IV, mouth rinse, senna-docusate, sodium chloride flush  Assessment: 31 YOM with PMH of follicular lymphoma in remission, RV failure, severe PAH, HTN, HLD, CAD, Afib/flutter, OSA, chronic lower extremity wounds. Returned to ICU 11/10 with worsening fever, hypotension consistent with septic shock. Patient is also persistently in either AF or Aflutter. Previously patient was on Eliquis and is now on bivalirudin with plans for possible procedures and history of HIT  Underwent cardiac cath 11/11 finding severe multivessel CAD -aPTT at goal on bivalirudin 0.05 mg/kg/hr, hg stable  Goal of Therapy:  aPTT 50-85 seconds Monitor platelets by anticoagulation protocol: Yes   Plan:  Continue bivalirudin at 0.05 mg/kg/hour  Monitor daily aPTT, CBC, and for s/sx of bleeding  Thank you for allowing pharmacy to participate in this patient's care,  Harland German, PharmD Clinical Pharmacist **Pharmacist phone directory can now be found on [email or web address] ([email or web address]).  Listed under [facility].

## 2023-06-27 NOTE — Plan of Care (Signed)
  Problem: Clinical Measurements: Goal: Ability to maintain clinical measurements within normal limits will improve Outcome: Progressing   

## 2023-06-27 NOTE — Progress Notes (Signed)
[facility] ADULT ICU REPLACEMENT PROTOCOL   The patient does apply for the [facility] Adult ICU Electrolyte Replacment Protocol based on the criteria listed below:   1.Exclusion criteria: TCTS, ECMO, Dialysis, and Myasthenia Gravis patients 2. Is GFR >/= 30 ml/min? Yes.    Patient's GFR today is 45 3. Is SCr </= 2? Yes.   Patient's SCr is 1.63 mg/dL 4. Did SCr increase >/= 0.5 in 24 hours? No. 5.Pt's weight >40kg  Yes.   6. Abnormal electrolyte(s): k 3.4  7. Electrolytes replaced per protocol 8.  Call MD STAT for K+ </= 2.5, Phos </= 1, or Mag </= 1 Physician:    Markus Daft A 06/27/2023 4:08 AM

## 2023-06-27 NOTE — Procedures (Signed)
Cortrak  Person Inserting Tube:  Greig Castilla D, RD Tube Type:  Cortrak - 43 inches Tube Size:  10 Tube Location:  Left nare Secured by: Bridle Technique Used to Measure Tube Placement:  Marking at nare/corner of mouth Cortrak Secured At:  78 cm Procedure Comments:  Cortrak Tube Team Note:  Consult received to place a Cortrak feeding tube.   X-ray is required, abdominal x-ray has been ordered by the Cortrak team. Please confirm tube placement before using the Cortrak tube.   If the tube becomes dislodged please keep the tube and contact the Cortrak team at [email or web address] for replacement.  If after hours and replacement cannot be delayed, place a NG tube and confirm placement with an abdominal x-ray.    Greig Castilla, RD, LDN Registered Dietitian II RD pager # available in AMION  After hours/weekend pager # available in [facility]

## 2023-06-28 DIAGNOSIS — I5033 Acute on chronic diastolic (congestive) heart failure: Secondary | ICD-10-CM | POA: Diagnosis not present

## 2023-06-28 DIAGNOSIS — R570 Cardiogenic shock: Secondary | ICD-10-CM | POA: Diagnosis not present

## 2023-06-28 LAB — COMPREHENSIVE METABOLIC PANEL
ALT: 239 U/L — ABNORMAL HIGH (ref 0–44)
AST: 144 U/L — ABNORMAL HIGH (ref 15–41)
Albumin: 2 g/dL — ABNORMAL LOW (ref 3.5–5.0)
Alkaline Phosphatase: 69 U/L (ref 38–126)
Anion gap: 9 (ref 5–15)
BUN: 66 mg/dL — ABNORMAL HIGH (ref 8–23)
CO2: 33 mmol/L — ABNORMAL HIGH (ref 22–32)
Calcium: 7.8 mg/dL — ABNORMAL LOW (ref 8.9–10.3)
Chloride: 96 mmol/L — ABNORMAL LOW (ref 98–111)
Creatinine, Ser: 1.27 mg/dL — ABNORMAL HIGH (ref 0.61–1.24)
GFR, Estimated: >60 mL/min (ref >60)
Glucose, Bld: 168 mg/dL — ABNORMAL HIGH (ref 70–99)
Potassium: 3 mmol/L — ABNORMAL LOW (ref 3.5–5.1)
Sodium: 138 mmol/L (ref 135–145)
Total Bilirubin: 1.3 mg/dL — ABNORMAL HIGH (ref <1.2)
Total Protein: 5.4 g/dL — ABNORMAL LOW (ref 6.5–8.1)

## 2023-06-28 LAB — POCT I-STAT 7, (LYTES, BLD GAS, ICA,H+H)
Acid-Base Excess: 9 mmol/L — ABNORMAL HIGH (ref 0.0–2.0)
Bicarbonate: 35 mmol/L — ABNORMAL HIGH (ref 20.0–28.0)
Calcium, Ion: 1.08 mmol/L — ABNORMAL LOW (ref 1.15–1.40)
HCT: 29 % — ABNORMAL LOW (ref 39.0–52.0)
Hemoglobin: 9.9 g/dL — ABNORMAL LOW (ref 13.0–17.0)
O2 Saturation: 99 %
Patient temperature: 38
Potassium: 3.4 mmol/L — ABNORMAL LOW (ref 3.5–5.1)
Sodium: 142 mmol/L (ref 135–145)
TCO2: 37 mmol/L — ABNORMAL HIGH (ref 22–32)
pCO2 arterial: 58.4 mm[Hg] — ABNORMAL HIGH (ref 32–48)
pH, Arterial: 7.39 (ref 7.35–7.45)
pO2, Arterial: 133 mm[Hg] — ABNORMAL HIGH (ref 83–108)

## 2023-06-28 LAB — GLUCOSE, CAPILLARY
Glucose-Capillary: 176 mg/dL — ABNORMAL HIGH (ref 70–99)
Glucose-Capillary: 183 mg/dL — ABNORMAL HIGH (ref 70–99)
Glucose-Capillary: 141 mg/dL — ABNORMAL HIGH (ref 70–99)
Glucose-Capillary: 154 mg/dL — ABNORMAL HIGH (ref 70–99)
Glucose-Capillary: 189 mg/dL — ABNORMAL HIGH (ref 70–99)

## 2023-06-28 LAB — CBC
HCT: 26.9 % — ABNORMAL LOW (ref 39.0–52.0)
Hemoglobin: 8.3 g/dL — ABNORMAL LOW (ref 13.0–17.0)
MCH: 29.4 pg (ref 26.0–34.0)
MCHC: 30.9 g/dL (ref 30.0–36.0)
MCV: 95.4 fL (ref 80.0–100.0)
Platelets: 133 10*3/uL — ABNORMAL LOW (ref 150–400)
RBC: 2.82 MIL/uL — ABNORMAL LOW (ref 4.22–5.81)
RDW: 21.9 % — ABNORMAL HIGH (ref 11.5–15.5)
WBC: 24.3 10*3/uL — ABNORMAL HIGH (ref 4.0–10.5)
nRBC: 0 % (ref 0.0–0.2)

## 2023-06-28 LAB — POCT ACTIVATED CLOTTING TIME
Activated Clotting Time: 187 s
Activated Clotting Time: 164 s

## 2023-06-28 LAB — APTT: aPTT: 82 s — ABNORMAL HIGH (ref 24–36)

## 2023-06-28 LAB — AMMONIA: Ammonia: 135 umol/L — ABNORMAL HIGH (ref 9–35)

## 2023-06-28 MED ORDER — FUROSEMIDE 10 MG/ML IJ SOLN
40.0000 mg | Freq: Once | INTRAMUSCULAR | Status: AC
  Administered 2023-06-28: 40 mg via INTRAVENOUS
  Filled 2023-06-28: qty 4

## 2023-06-28 MED ORDER — LACTULOSE 10 GM/15ML PO SOLN
30.0000 g | Freq: Once | ORAL | Status: AC
  Administered 2023-06-28: 30 g
  Filled 2023-06-28: qty 45

## 2023-06-28 MED ORDER — ATROPINE SULFATE 1 MG/10ML IJ SOSY
PREFILLED_SYRINGE | INTRAMUSCULAR | Status: AC
  Filled 2023-06-28: qty 10

## 2023-06-28 MED ORDER — SODIUM CHLORIDE 0.9 % IV SOLN
0.0500 mg/kg/h | INTRAVENOUS | Status: DC
  Administered 2023-06-28 – 2023-06-30 (×3): 0.05 mg/kg/h via INTRAVENOUS
  Filled 2023-06-28 (×3): qty 250

## 2023-06-28 MED ORDER — POTASSIUM CHLORIDE 20 MEQ PO PACK
40.0000 meq | PACK | ORAL | Status: AC
  Administered 2023-06-28 (×2): 40 meq via ORAL
  Filled 2023-06-28 (×2): qty 2

## 2023-06-28 MED ORDER — INSULIN ASPART 100 UNIT/ML IJ SOLN
2.0000 [IU] | INTRAMUSCULAR | Status: DC
  Administered 2023-06-28: 4 [IU] via SUBCUTANEOUS
  Administered 2023-06-28: 2 [IU] via SUBCUTANEOUS
  Administered 2023-06-28 – 2023-06-29 (×2): 4 [IU] via SUBCUTANEOUS

## 2023-06-28 MED ORDER — CEFEPIME HCL 2 G IV SOLR
2.0000 g | Freq: Three times a day (TID) | INTRAVENOUS | Status: AC
  Administered 2023-06-28 – 2023-07-01 (×9): 2 g via INTRAVENOUS
  Filled 2023-06-28 (×9): qty 12.5

## 2023-06-28 MED ORDER — FUROSEMIDE 10 MG/ML IJ SOLN
60.0000 mg | Freq: Once | INTRAMUSCULAR | Status: AC
  Administered 2023-06-28: 60 mg via INTRAVENOUS
  Filled 2023-06-28: qty 6

## 2023-06-28 MED ORDER — MIDODRINE HCL 5 MG PO TABS
10.0000 mg | ORAL_TABLET | Freq: Three times a day (TID) | ORAL | Status: DC
  Administered 2023-06-28 – 2023-07-01 (×10): 10 mg
  Filled 2023-06-28 (×11): qty 2

## 2023-06-28 NOTE — Plan of Care (Signed)
  Problem: Clinical Measurements: Goal: Cardiovascular complication will be avoided Outcome: Progressing   Problem: Nutrition: Goal: Adequate nutrition will be maintained Outcome: Progressing   Problem: Pain Management: Goal: General experience of comfort will improve Outcome: Progressing   Problem: Skin Integrity: Goal: Risk for impaired skin integrity will decrease Outcome: Progressing

## 2023-06-28 NOTE — Progress Notes (Addendum)
PHARMACY - ANTICOAGULATION CONSULT NOTE  Pharmacy Consult for bivalirudin Indication:  atrial flutter  Allergies  Allergen Reactions   Bendamustine Hcl     See hospital notes from 01/12/15, legs turned black   Heparin Other (See Comments)    Heparin induced thrombocytopenia    Bendamustine Hcl Rash    Severe   Tape Rash    If left on for prolong periods of time     Patient Measurements: Height: 6' (182.9 cm) Weight: 115.9 kg (255 lb 8.2 oz) IBW/kg (Calculated) : 77.6 Heparin Dosing Weight: 113.9 kg  Vital Signs: Temp: 99.9 F (37.7 C) (11/14 0615) BP: 111/58 (11/14 0600) Pulse Rate: 79 (11/14 0615)  Labs: Recent Labs    06/26/23 0414 06/26/23 0758 06/27/23 0242 06/27/23 0809 06/27/23 0831 06/28/23 0501  HGB 8.7*   < > 8.3* 9.5* 9.5* 8.3*  HCT 28.1*   < > 26.7* 28.0* 28.0* 26.9*  PLT 199  --  157  --   --  133*  APTT 52*  --  71*  --   --  82*  CREATININE 1.60*  --  1.63*  --   --  1.27*   < > = values in this interval not displayed.    Estimated Creatinine Clearance: 72.1 mL/min (A) (by C-G formula based on SCr of 1.27 mg/dL (H)).   Medical History: Past Medical History:  Diagnosis Date   Anxiety    Basal cell carcinoma of nose    Blood dyscrasia    trouble clotting    Carotid artery disease (HCC)    a. s/p L CEA 2009 (hx of evacuation of hematoma due to heparin); b. 01/2022 Carotid U/S: 1-39% bilat stenosis.   Chronic heart failure with preserved ejection fraction (HFpEF) (HCC)    a. 04/2022 Echo: EF 60-65%, no rwma, GrII DD, nl RV fxn, RVSP 47.62mmHg. Mild BAE. Mild MR. Mild-mod TR.   Chronic lower back pain    CKD (chronic kidney disease), stage III (HCC)    COPD (chronic obstructive pulmonary disease) (HCC)    Coronary artery disease    a. 2005 s/p CABG x 3 (LIMA->LAD, VG->OM1, VG->dRCA); b. 09/2011 s/p PCI/DES of the VG->OM1 and VG->dRCA; c. 11/2017 PCI: LM 50ost, LAD 90ost/p, OM1 100 CTO, RCA 100p CTO, VG->dRCA 99 ISR (4.0x22 Resolute Onyx DES),  VG->OM1 100 CTO/ISR, LIMA->LAD patent.   Depression    Diabetes mellitus without complication (HCC)    borderline    Dysrhythmia    fluttering   Edema    GERD (gastroesophageal reflux disease)    Heart murmur    Heparin induced thrombocytopenia (HCC)    History of home oxygen therapy    2 liters at night prn   Hyperlipidemia    Hypertension    Lymphoma (HCC) 12/21/2014   Myocardial infarction (HCC)    Normocytic anemia    Obesity    Sleep apnea    a. Not using CPAP.    Medications:  Medications Prior to Admission  Medication Sig Dispense Refill Last Dose   albuterol (PROVENTIL) (2.5 MG/3ML) 0.083% nebulizer solution INHALE 1 VIAL VIA NEBULIZER EVERY DAY AS NEEDED FOR WHEEZING/SHORTNESS OF BREATH 75 mL 5 Past Month   aspirin EC 81 MG tablet Take 81 mg by mouth every evening.    06/17/2023   aspirin-sod bicarb-citric acid (ALKA-SELTZER) 325 MG TBEF tablet Take 650 mg by mouth every 6 (six) hours as needed (indigestion).   Past Week   clonazePAM (KLONOPIN) 1 MG tablet Take 1 tablet (  1 mg total) by mouth at bedtime. 30 tablet 5 06/17/2023   clopidogrel (PLAVIX) 75 MG tablet TAKE 1 TABLET BY MOUTH EVERY DAY WITH BREAKFAST 90 tablet 3 06/18/2023 at AM   metolazone (ZAROXOLYN) 5 MG tablet Take 1 tablet (5 mg total) by mouth daily as needed (swelling). 90 tablet 1 06/17/2023   metoprolol succinate (TOPROL-XL) 25 MG 24 hr tablet Take 0.5 tablets (12.5 mg total) by mouth daily. Take with or immediately following a meal.   06/17/2023   nitroGLYCERIN (NITROSTAT) 0.4 MG SL tablet Place 1 tablet (0.4 mg total) under the tongue every 5 (five) minutes as needed for chest pain (Up to 3 doses). 25 tablet 1 unknown   oxyCODONE-acetaminophen (PERCOCET) 7.5-325 MG tablet Take 1 tablet by mouth every 6 (six) hours as needed for severe pain. 120 tablet 0 06/18/2023   oxymetazoline (AFRIN) 0.05 % nasal spray Place 1 spray into both nostrils 2 (two) times daily as needed for congestion.   unknown   simvastatin  (ZOCOR) 20 MG tablet Take 1 tablet (20 mg total) by mouth at bedtime. 90 tablet 3 06/17/2023   torsemide (DEMADEX) 20 MG tablet Take 20 mg by mouth daily.   06/17/2023   senna-docusate (SENOKOT-S) 8.6-50 MG tablet Take 1-2 tablets by mouth at bedtime. (Patient not taking: Reported on 06/18/2023) 100 tablet 6 Not Taking   Scheduled:   acetaZOLAMIDE  500 mg Per Tube BID   budesonide (PULMICORT) nebulizer solution  0.25 mg Nebulization BID   Chlorhexidine Gluconate Cloth  6 each Topical Daily   clonazepam  0.5 mg Per Tube BID   clopidogrel  75 mg Per NG tube Daily   famotidine  20 mg Per Tube Daily   feeding supplement (PROSource TF20)  60 mL Per Tube TID   fentaNYL (SUBLIMAZE) injection  25 mcg Intravenous Once   hydrocortisone sod succinate (SOLU-CORTEF) inj  100 mg Intravenous Q12H   liver oil-zinc oxide   Topical BID   midodrine  5 mg Per Tube TID WC   nutrition supplement (JUVEN)  1 packet Per Tube BID BM   mouth rinse  15 mL Mouth Rinse Q2H   polyethylene glycol  17 g Per Tube Daily   QUEtiapine  25 mg Per Tube BID   revefenacin  175 mcg Nebulization Daily   senna-docusate  1 tablet Per Tube BID   sodium chloride flush  3 mL Intravenous Q12H   Infusions:   amiodarone 60 mg/hr (06/28/23 0723)   bivalirudin (ANGIOMAX) 250 mg in sodium chloride 0.9 % 500 mL (0.5 mg/mL) infusion 0.05 mg/kg/hr (06/28/23 0600)   ceFEPime (MAXIPIME) IV Stopped (06/27/23 2344)   dexmedetomidine (PRECEDEX) IV infusion 0.2 mcg/kg/hr (06/28/23 0722)   feeding supplement (VITAL 1.5 CAL) 60 mL/hr at 06/28/23 0600   fentaNYL infusion INTRAVENOUS Stopped (06/27/23 1042)   norepinephrine (LEVOPHED) Adult infusion 11 mcg/min (06/28/23 0600)   propofol (DIPRIVAN) infusion Stopped (06/27/23 0900)   vasopressin 0.03 Units/min (06/28/23 0600)   PRN: acetaminophen, fentaNYL, fentaNYL (SUBLIMAZE) injection, fentaNYL (SUBLIMAZE) injection, ondansetron (ZOFRAN) IV, mouth rinse, senna-docusate, sodium chloride  flush  Assessment: 42 YOM with PMH of follicular lymphoma in remission, RV failure, severe PAH, HTN, HLD, CAD, Afib/flutter, OSA, chronic lower extremity wounds. Returned to ICU 11/10 with worsening fever, hypotension consistent with septic shock. Patient is also persistently in either AF or Aflutter. Previously patient was on Eliquis and is now on bivalirudin with plans for possible procedures and history of HIT  Underwent cardiac cath 11/11 finding severe multivessel  CAD -aPTT at goal on bivalirudin 0.05 mg/kg/hr, hg stable  Goal of Therapy:  aPTT 50-85 seconds Monitor platelets by anticoagulation protocol: Yes   Plan:  Continue bivalirudin at 0.05 mg/kg/hour  Monitor daily aPTT, CBC, and for s/sx of bleeding  Thank you for allowing pharmacy to participate in this patient's care,  Harland German, PharmD Clinical Pharmacist **Pharmacist phone directory can now be found on [email or web address] ([email or web address]).  Listed under [facility].  Addendum - bivalirudin was placed on hold and the sheath was removed at 12pm  Plan -restart bivalirudin at 0.05mg /kg/hr -Check aPTT and CBC in am  Harland German, PharmD Clinical Pharmacist **Pharmacist phone directory can now be found on [email or web address] ([email or web address]).  Listed under [facility].

## 2023-06-28 NOTE — Progress Notes (Signed)
PT Cancellation Note  Patient Details Name: [name] MRN: [id number] DOB: Dec 13, 1955   Cancelled Treatment:    Reason Eval/Treat Not Completed: Patient not medically ready (pt on bedrest post sheath pull, hopeful extubation. Will check next date for medical appropriateness vs sign off)   [name] 06/28/2023, 12:10 PM Merryl Hacker, PT Acute Rehabilitation Services Office: [phone]

## 2023-06-28 NOTE — Progress Notes (Signed)
Bival stopped at 0830 per cardiology. Plan to pull sheaths per orders/protocol.   ACT 187 at 1023 ACT 164 at 1114 - okay to pull sheath per protocol   Patient lying flat, supplies at bedside. Blood aspirated from both sheaths with no clots.  Femoral pulse found, arterial sheath pulled at 1120, manual pressure held. Venous sheath pulled at 1135, manual pressure held at both arterial and venous sites. Hemostasis achieved at 1150. No hematoma, minimal bruising, pedal pulses dopplered. Pressure dressing in place. Patient remains flat, will raise HOB per protocol.

## 2023-06-28 NOTE — Progress Notes (Signed)
PHARMACY NOTE:  ANTIMICROBIAL RENAL DOSAGE ADJUSTMENT  Current antimicrobial regimen includes a mismatch between antimicrobial dosage and estimated renal function.  As per policy approved by the Pharmacy & Therapeutics and Medical Executive Committees, the antimicrobial dosage will be adjusted accordingly.  Current antimicrobial dosage:  cefepime 2gm IV q12h  Indication: sepsis  Renal Function:  Estimated Creatinine Clearance: 72.1 mL/min (A) (by C-G formula based on SCr of 1.27 mg/dL (H)). []      On intermittent HD, scheduled: []      On CRRT    Antimicrobial dosage has been changed to:  cefepime 2gm IV q8h  Additional comments:   Thank you for allowing pharmacy to be a part of this patient's care.  Harland German, PharmD Clinical Pharmacist **Pharmacist phone directory can now be found on [email or web address] ([email or web address]).  Listed under [facility].

## 2023-06-28 NOTE — Progress Notes (Signed)
NAME:  Bronsyn Harrower, MRN:  [id number], DOB:  07-02-1956, LOS: 10 ADMISSION DATE:  06/18/2023, CONSULTATION DATE:  06/24/23 REFERRING MD:  Duard Larsen - TRH, CHIEF COMPLAINT:  AMS   History of Present Illness:  67 yo M PMH follicular lymphoma in remission, RV failure, severe PAH g3, HTN, HLD, CAD, Afib/flutter, OSA/OHS, obesity, venous insufficiency, chronic lower extremity wounds who was admitted 11/4 for lower extremity/groin swelling after presenting to his outpt wound care clinic.  It was identified that pt was not taking diuretic as Rx. Admitted to HF exacerbation w concomitant AKI, cards consulted and aggressively diuresed 11/4-11/8  On 11/9 the pt was more altered. Ammonia was sent and resulted elevated 129. Lactulose started. Over the course of 11/9-11/10 night shift pt was progressively more lethargic and started to have low BP as well as low-grade temp which was new. TRH sent cx, started broad abx.  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 Discovered that he wasn't taking diuretic as Rx. AKI, HF exacerbation. Aggressive diuresis started 11/5 - 11/8 ongoing diuresis.  11/9 night AMS, hyperammonemia started lactulose 11/10 early AM hypotension worse AMS. CCM consult, ICU transfer  11/11 - LHC grafts patent, progression of native disease. LVEDP 27. RHC low SVR, high CI consistent with sepsis, but PCWP still high at 31.   Interim History / Subjective:   Added vasopressin and stress steroids for increasing NE requirements yesterday and stopped diuresis.  Objective   Blood pressure (!) 111/58, pulse 79, temperature 99.9 F (37.7 C),  resp. rate 20, height 6' (1.829 m), weight 115.9 kg, SpO2 100%. PAP: (41-64)/(23-37) 59/27 CVP:  [7 mmHg-48 mmHg] 13 mmHg CO:  [7.3 L/min] 7.3 L/min CI:  [3.2 L/min/m2] 3.2 L/min/m2  Vent Mode: CPAP;PSV FiO2 (%):  [40 %] 40 % Set Rate:  [20 bmp] 20 bmp Vt Set:  [620 mL] 620 mL PEEP:  [5 cmH20] 5 cmH20 Pressure Support:  [10 cmH20] 10 cmH20 Plateau Pressure:  [21 cmH20-22 cmH20] 22 cmH20   Intake/Output Summary (Last 24 hours) at 06/28/2023 0938 Last data filed at 06/28/2023 0831 Gross per 24 hour  Intake 4082.05 ml  Output 2350 ml  Net 1732.05 ml   Filed Weights   06/27/23 0500 06/28/23 0500 06/28/23 0700  Weight: 111 kg 115.9 kg 115.9 kg    Examination: General: overweight man on mechanical ventilation.  HENT: orally intubated, OGT in place  Lungs: Wheezing much improved. No crackles. Normal Pplat.  Cardiovascular: tachycardic again in afib. Extremities are warmer.  Diffuse apex with RV lift. Loud S1/S2 but no murmur.  Abdomen: soft round. BS are present.  Extremities: BLE wrapped, edematous, mainly in dependent areas. ++ bruising.  Neuro: tremors to pain. Opens eyes to voice but not following commands.  GU: scrotal edema much improved, external urine collection system in place  Ancillary Tests Personally Reviewed:  Cultures remain negative. Leukocytosis leveling off at 24.3 7.39/58/133/99% CO2 improved to 33 Creatinine now 1.27 AST/ALT are improving  Assessment & Plan:   Mixed shock picture based on hemodynamics: distributive based on low SVR but likely also cardiogenic shock  component given elevated PCWP.  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  Transaminitis due to worsening hepatic congestion vs shock liver. Aflutter CAD s/p CABG x3, PCI - no evidence of new coronary obstruction.  HLD Hx HIT  Acute encephalopathy -  multifactorial, likely shock related.  AKI - due to cardiorenal syndrome.  Frailty with sarcopenia and falls at home  Plan:  -Titrate NE and VP to keep MAP >65. Wean VP once NE below 15. - Continue stress steroids.  - Filling pressures remain high, diurese today . May do better with lower intensity intermittent diuresis - Tolerating SBT, mental status is main barrier to extubation.  Stop all sedation including enteral agents. -Recheck ammonia and resume lactulose. 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 - Increase amiodarone to deal with tachycardia - Continue Yupelri.  - Continue bivalirudin for Afib.  Can resume hypotensive statin tomorrow if LFTs continue to improve. - Complete 7 days of antibiotics empirically. Cultures are negative but hemodynamics are consistent with septic shock.  Follow leukocytosis.  Will hold on further imaging for now and focus on trying to extubate.  Add fungal coverage tomorrow if white count not improving. - Follow creatinine particular with diuresis.  RV function may not be sufficient to allow for peripheral fluid mobilization. - Continue wound care per WOC RN  Best Practice (right click and "Reselect all SmartList Selections" daily)   Diet/type: Continue tube feeds.  DVT prophylaxis: Bivalirudin GI prophylaxis: H2 blocker Lines: Right internal jugular  Foley:  N/A - external catheter.  Code Status:  full code Last date of multidisciplinary goals of care discussion [11/10 ]  CRITICAL CARE Performed by: Lynnell Catalan   Total critical care time: 40 minutes  Critical care time was exclusive of separately billable procedures and treating other patients.  Critical care was necessary to treat or prevent imminent or life-threatening deterioration.  Critical care was time spent personally by me on the following activities: development of treatment plan with patient and/or surrogate as well as  nursing, discussions with consultants, evaluation of patient's response to treatment, examination of patient, obtaining history from patient or surrogate, ordering and performing treatments and interventions, ordering and review of laboratory studies, ordering and review of radiographic studies, pulse oximetry, re-evaluation of patient's condition and participation in multidisciplinary rounds.  Lynnell Catalan, MD [facility] ICU Physician [facility]  Pager: [phone] Mobile: [phone] After hours: [phone].  06/28/2023, 9:38 AM

## 2023-06-28 NOTE — Progress Notes (Signed)
Patient Name: [name] Date of Encounter: 06/28/2023 [facility] Cardiologist: Charlton Haws, MD   Interval Summary  .    Febrile yesterday.  WBC still very high. Improved BP on combination norepinephrine plus vasopressin, less sedation. Cardiac output remains high (mixed venous O2 76%). Improved flutter rate control. Maintaining excellent urine output and renal parameters continue to improve, but positive fluid balance almost 3 L in the last 48 hours. Markedly hypokalemic, being replaced.  PAP 50/25, CVP 12 while systemic BP 109/46  Vital Signs .    Vitals:   06/28/23 0545 06/28/23 0600 06/28/23 0615 06/28/23 0700  BP: (!) 109/58 (!) 111/58    Pulse: 79 79 79   Resp: 20 20 20    Temp: 99.7 F (37.6 C) 99.7 F (37.6 C) 99.9 F (37.7 C)   TempSrc:      SpO2: 100% 100% 100%   Weight:    115.9 kg  Height:        Intake/Output Summary (Last 24 hours) at 06/28/2023 0834 Last data filed at 06/28/2023 0831 Gross per 24 hour  Intake 4417.27 ml  Output 2475 ml  Net 1942.27 ml      06/28/2023    7:00 AM 06/28/2023    5:00 AM 06/27/2023    5:00 AM  Last 3 Weights  Weight (lbs) 255 lb 8.2 oz 255 lb 8.2 oz 244 lb 11.4 oz  Weight (kg) 115.9 kg 115.9 kg 111 kg      Telemetry/ECG    Atrial flutter with controlled ventricular rate- Personally Reviewed      Echocardiogram 06/24/2023    1. Left ventricular ejection fraction, by estimation, is 35 to 40%. The  left ventricle has moderately decreased function. The left ventricle  demonstrates regional wall motion abnormalities (see scoring  diagram/findings for description). There is  moderate left ventricular hypertrophy. Left ventricular diastolic function  could not be evaluated. There is the interventricular septum is flattened  in systole and diastole, consistent with right ventricular pressure and  volume overload. There is  moderate hypokinesis of the left ventricular, entire inferior wall  and  inferoseptal wall.   2. Right ventricular systolic function is severely reduced. The right  ventricular size is severely enlarged.   3. Left atrial size was severely dilated.   4. Right atrial size was severely dilated.   5. Moderate mitral valve regurgitation. No evidence of mitral stenosis.  Moderate mitral annular calcification.   6. Tricuspid valve regurgitation is moderate.   7. The aortic valve is tricuspid. There is mild calcification of the  aortic valve. Aortic valve regurgitation is not visualized. Aortic valve  sclerosis/calcification is present, without any evidence of aortic  stenosis.   Comparison(s): Prior images reviewed side by side. Changes from prior  study are noted. The left ventricular function is significantly worse.      Right and left heart catheterization 06/25/2023   Coronary angiography 06/25/2023: LM: Ostial to distal 70% stenosis (Progressed form 50% stenosis in 2019) LAD: Mid occlusion LIMA-LAD: Patent.  Ostial spasm improved with IA NTG. No stenosis Lcx: Mid 30% disease Ramus: Proximal occlusion SVG-Ramus: Ostial occlusion RCA: Ostial occlusion SVG-PDA: Patent. No significant disease   LVEDP 27 mmHg     Right heart catheterization 06/25/2023: RA: 12 mmHg RV: 59/4 mmHg PA: 60/29 mmHg, mPAP 42 mmHg PCW: 31 mmHg   AO sats: 99% PA sats: 76%   CO: 12.6 L/min CI: 5.6 L/min/m2 SVR: 343 Dynes   Conclusion: Severe multivessel CAD No  new unprotected vessel occlusion LM disease appears progressed since 2019 Circulatory shock likely septic in etiology Continue supportive management If and when patient is stable from septic shock standpoint, could consider PCI to protected LM/Lcx   Unusually prolonged procedure time due to difficulty obtaining PA catheter access form Rt CFV, having to switch to Rt internal jugular access. Also, there was significant difficulty accessing all grafts, and the need to use multiple catheters. Physical Exam .    GEN: No acute distress.   Neck: 10-11 cm JVD, prominent v waves Cardiac: RRR, no diastolic murmurs, rubs, or gallops.  Respiratory: Clear to auscultation bilaterally. GI: Soft, nontender, non-distended  MS: Dependent edema 2+  Assessment & Plan .     Septic shock: Remains pressor dependent and still has elevated cardiac output, but with well-balanced hemodynamics on current combination nor epi plus vasopressin.  Worse of infection unclear.  He is still on broad-spectrum antibiotics.  Systemic vascular resistance may also be low due to possible chronic liver disease/cirrhosis to high CO, since he has had longstanding right heart failure and liver congestion.  Although all lines were placed following the onset of shock, it is time for [place] to remove his right femoral arterial sheath.  Will stop anticoagulation, check ACT, remove the sheath and resume anticoagulation 6-8 hours later.  Will need alternate arterial line for monitoring. Cardiogenic shock/NSTEMI: He has had a significant new myocardial infarction and an reduction in LVEF, with elevated left heart filling pressures.  CAD s/p CABG: Still has 2/3 patent grafts, as he did at his previous cardiac catheterization in 2019.  There has been progression of left main coronary artery stenosis.  After he improves, we have an option to perform stents to protected left main coronary artery to improve flow to the left circumflex and ramus intermedius arteries.  I suspect that his infarction may have been related to ischemia in the circumflex coronary artery territory in the setting of septic shock related hypotension. RHF due to [facility]): Severe, precedes the acute illness, is the cause of his chronic cor pulmonale and right heart failure.  Now also has increased pulmonary venous pressures following reduction in LVEF.  Remains hypervolemic.  We have backtracked on diuresis during his episode of sepsis, he has gained about 3 L in the last 48 hours.  Will give  furosemide today, after we have had a chance to replace his potassium level.   AKI: Creatinine improved from peak, almost back to previous baseline around 0.9-1.0. Shock liver: Transaminases continue to improve and bilirubin is almost back to normal.  Note marked elevation in serum ammonia before his decompensation (this may have contributed to encephalopathy and aspiration?). Acute on chronic hypercapnic respiratory failure: Oxygenating well on FiO2 0.4.  Still hypercapnic with marked metabolic alkalosis.  Suspect that his usual CO2 is chronically in the 55-60 range. OSA/COPD/probable obesity hypoventilation syndrome: Mechanism for PAH/chronic cor pulmonale/right heart failure.  Unlikely to benefit from pulmonary vasodilators. Persistent atrial flutter: Improved after increasing the amiodarone dose.  Will cut back amiodarone to 0.5 mg/h.. Iron deficiency anemia: Precedes this hospitalization.  Cause unclear.  So far no evidence of acute bleeding.  Hemoglobin stable. 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  Planned to use this for about 72 hours following the acute coronary event (from ACS point of view we could consider stopping today, will continue for the time being for atrial fibrillation and DVT/PE prophylaxis, as there is still the possibility that he will require invasive/surgical interventions).  CRITICAL CARE Performed by: Rachelle Hora Deavion Dobbs   Total critical care time: 60 minutes  Critical care time was exclusive of separately billable procedures and treating other patients.  Critical care was necessary to treat or prevent imminent or life-threatening deterioration.  Critical care was time spent personally by me on the following activities: development of treatment plan with patient and/or surrogate as well as nursing, discussions with consultants, evaluation of patient's response to treatment, examination of patient, obtaining history from patient or surrogate, ordering and  performing treatments and interventions, ordering and review of laboratory studies, ordering and review of radiographic studies, pulse oximetry and re-evaluation of patient's condition.  For questions or updates, please contact [facility] Please consult [email or web address] for contact info under        Signed, Thurmon Fair, MD

## 2023-06-28 NOTE — Plan of Care (Signed)
  Problem: Clinical Measurements: Goal: Ability to maintain clinical measurements within normal limits will improve Outcome: Progressing   

## 2023-06-29 DIAGNOSIS — R570 Cardiogenic shock: Secondary | ICD-10-CM | POA: Diagnosis not present

## 2023-06-29 DIAGNOSIS — I5033 Acute on chronic diastolic (congestive) heart failure: Secondary | ICD-10-CM | POA: Diagnosis not present

## 2023-06-29 LAB — COMPREHENSIVE METABOLIC PANEL
ALT: 182 U/L — ABNORMAL HIGH (ref 0–44)
AST: 77 U/L — ABNORMAL HIGH (ref 15–41)
Albumin: 2.1 g/dL — ABNORMAL LOW (ref 3.5–5.0)
Alkaline Phosphatase: 77 U/L (ref 38–126)
Anion gap: 9 (ref 5–15)
BUN: 94 mg/dL — ABNORMAL HIGH (ref 8–23)
CO2: 34 mmol/L — ABNORMAL HIGH (ref 22–32)
Calcium: 7.9 mg/dL — ABNORMAL LOW (ref 8.9–10.3)
Chloride: 100 mmol/L (ref 98–111)
Creatinine, Ser: 1.48 mg/dL — ABNORMAL HIGH (ref 0.61–1.24)
GFR, Estimated: 51 mL/min — ABNORMAL LOW (ref >60)
Glucose, Bld: 167 mg/dL — ABNORMAL HIGH (ref 70–99)
Potassium: 4.1 mmol/L (ref 3.5–5.1)
Sodium: 143 mmol/L (ref 135–145)
Total Bilirubin: 1.1 mg/dL (ref <1.2)
Total Protein: 5.3 g/dL — ABNORMAL LOW (ref 6.5–8.1)

## 2023-06-29 LAB — CULTURE, BLOOD (ROUTINE X 2)
Culture: NO GROWTH
Culture: NO GROWTH

## 2023-06-29 LAB — CBC
HCT: 26.3 % — ABNORMAL LOW (ref 39.0–52.0)
Hemoglobin: 7.9 g/dL — ABNORMAL LOW (ref 13.0–17.0)
MCH: 29.4 pg (ref 26.0–34.0)
MCHC: 30 g/dL (ref 30.0–36.0)
MCV: 97.8 fL (ref 80.0–100.0)
Platelets: 160 10*3/uL (ref 150–400)
RBC: 2.69 MIL/uL — ABNORMAL LOW (ref 4.22–5.81)
RDW: 22.4 % — ABNORMAL HIGH (ref 11.5–15.5)
WBC: 21 10*3/uL — ABNORMAL HIGH (ref 4.0–10.5)
nRBC: 0.1 % (ref 0.0–0.2)

## 2023-06-29 LAB — GLUCOSE, CAPILLARY
Glucose-Capillary: 124 mg/dL — ABNORMAL HIGH (ref 70–99)
Glucose-Capillary: 152 mg/dL — ABNORMAL HIGH (ref 70–99)
Glucose-Capillary: 157 mg/dL — ABNORMAL HIGH (ref 70–99)
Glucose-Capillary: 159 mg/dL — ABNORMAL HIGH (ref 70–99)
Glucose-Capillary: 168 mg/dL — ABNORMAL HIGH (ref 70–99)
Glucose-Capillary: 173 mg/dL — ABNORMAL HIGH (ref 70–99)

## 2023-06-29 LAB — AMMONIA: Ammonia: 119 umol/L — ABNORMAL HIGH (ref 9–35)

## 2023-06-29 LAB — APTT: aPTT: 70 s — ABNORMAL HIGH (ref 24–36)

## 2023-06-29 MED ORDER — ATORVASTATIN CALCIUM 40 MG PO TABS
40.0000 mg | ORAL_TABLET | Freq: Every day | ORAL | Status: DC
  Administered 2023-06-30 – 2023-07-01 (×2): 40 mg
  Filled 2023-06-29 (×2): qty 1

## 2023-06-29 MED ORDER — FUROSEMIDE 10 MG/ML IJ SOLN
60.0000 mg | Freq: Two times a day (BID) | INTRAMUSCULAR | Status: DC
  Administered 2023-06-29 – 2023-06-30 (×3): 60 mg via INTRAVENOUS
  Filled 2023-06-29 (×3): qty 6

## 2023-06-29 MED ORDER — INSULIN ASPART 100 UNIT/ML IJ SOLN
2.0000 [IU] | INTRAMUSCULAR | Status: DC
  Administered 2023-06-29 (×3): 4 [IU] via SUBCUTANEOUS
  Administered 2023-06-29 – 2023-06-30 (×2): 2 [IU] via SUBCUTANEOUS
  Administered 2023-06-30: 4 [IU] via SUBCUTANEOUS
  Administered 2023-06-30 (×2): 2 [IU] via SUBCUTANEOUS
  Administered 2023-06-30: 4 [IU] via SUBCUTANEOUS
  Administered 2023-06-30: 2 [IU] via SUBCUTANEOUS
  Administered 2023-07-01 (×3): 4 [IU] via SUBCUTANEOUS

## 2023-06-29 MED ORDER — LACTULOSE 10 GM/15ML PO SOLN
10.0000 g | Freq: Two times a day (BID) | ORAL | Status: DC
  Administered 2023-06-29 – 2023-06-30 (×3): 10 g
  Filled 2023-06-29 (×3): qty 15

## 2023-06-29 MED ORDER — AMIODARONE HCL 200 MG PO TABS
200.0000 mg | ORAL_TABLET | Freq: Two times a day (BID) | ORAL | Status: DC
Start: 2023-06-29 — End: 2023-07-01
  Administered 2023-06-29 – 2023-07-01 (×5): 200 mg via NASOGASTRIC
  Filled 2023-06-29 (×5): qty 1

## 2023-06-29 NOTE — Progress Notes (Signed)
Patient Name: [name] Date of Encounter: 06/29/2023 [facility] Cardiologist: Charlton Haws, MD   Interval Summary  .    Improved mental status.  Opens eyes and tracks, not consistently following commands. Did well with 6 hours of spontaneous breathing trial on the ventilator yesterday, more attempts to wean today.  Continues to oxygenate well. Reduce pressor requirements.  Cardiac index slightly lower today as is the WBC.  Very slight downward trend in hemoglobin and upward trend in BUN. Maintaining excellent urine output, but net positive balance for the last several days.  CVP 12, PAP 70/30, BP 119/69, cardiac index 3.7  Vital Signs .    Vitals:   06/29/23 0545 06/29/23 0600 06/29/23 0615 06/29/23 0630  BP: 119/69 115/68 109/67 107/64  Pulse: 92 88 85 89  Resp: 16 16 19 19   Temp: 100 F (37.8 C) 100 F (37.8 C) 100 F (37.8 C) 100 F (37.8 C)  TempSrc:      SpO2: 99% 98% 99% 98%  Weight:      Height:        Intake/Output Summary (Last 24 hours) at 06/29/2023 0902 Last data filed at 06/29/2023 4696 Gross per 24 hour  Intake 2687.47 ml  Output 1265 ml  Net 1422.47 ml      06/29/2023    4:00 AM 06/28/2023    7:00 AM 06/28/2023    5:00 AM  Last 3 Weights  Weight (lbs) 252 lb 6.8 oz 255 lb 8.2 oz 255 lb 8.2 oz  Weight (kg) 114.5 kg 115.9 kg 115.9 kg      Telemetry/ECG    Atrial flutter with controlled ventricular response- Personally Reviewed  Physical Exam .   GEN: No acute distress.   Neck: 12 cm JVD Cardiac: RRR, 1-2/6 holosystolic murmur heard best at the right lower sternal border no diastolic murmurs, rubs, or gallops.  Respiratory: Bilateral rhonchi GI: Soft, nontender, non-distended  MS: ++ edema  Assessment & Plan .     Septic shock: Some subtle signs of improvement, lower pressor requirements and lower WBC.  Source of infection unclear.  He is still on broad-spectrum antibiotics.  Systemic vascular resistance may also be  low due to possible chronic liver disease/cirrhosis to high CO, since he has had longstanding right heart failure and liver congestion.   Cardiogenic shock/NSTEMI: He has had a significant new myocardial infarction and an reduction in LVEF, with elevated left heart filling pressures.  CAD s/p CABG: Still has 2/3 patent grafts, as he did at his previous cardiac catheterization in 2019.  There has been progression of left main coronary artery stenosis.  After he improves, we have an option to perform stents to protected left main coronary artery to improve flow to the left circumflex and ramus intermedius arteries.  I suspect that his infarction may have been related to ischemia in the circumflex coronary artery territory in the setting of septic shock related hypotension. RHF due to [facility]): Severe, precedes the acute illness, is the cause of his chronic cor pulmonale and right heart failure.  Now also has increased pulmonary venous pressures following reduction in LVEF.  Remains hypervolemic.  We have backtracked on diuresis during his episode of sepsis, need to restart diuretics.   AKI: Creatinine was improving steadily, with upward trend today. Previous baseline around 0.9-1.0.  Note disproportionately severe increase in BUN (be on the look out for any signs of GI bleeding). Shock liver: Transaminases and bilirubin almost back to normal.  Marked hypoalbuminemia.  Note marked elevation in serum ammonia before his decompensation (this may have contributed to encephalopathy and aspiration?). Acute on chronic hypercapnic respiratory failure: Oxygenating well on FiO2 0.4.  Still hypercapnic with marked metabolic alkalosis.  Suspect that his usual CO2 is chronically in the 55-60 range. OSA/COPD/probable obesity hypoventilation syndrome: Mechanism for PAH/chronic cor pulmonale/right heart failure.  Unlikely to benefit from pulmonary vasodilators. Persistent atrial flutter: Good rate control on IV amiodarone.   Has been on tube feeds for the last 48 hours.  Will switch to p.o. route. Iron deficiency anemia: Precedes this hospitalization.  Cause unclear.  So far no evidence of acute bleeding.  Hemoglobin stable. 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  Would like to switch back to direct oral anticoagulation once no invasive procedures are needed. CRITICAL CARE Performed by: Rachelle Hora Michaelia Beilfuss   Total critical care time: 60 minutes  Critical care time was exclusive of separately billable procedures and treating other patients.  Critical care was necessary to treat or prevent imminent or life-threatening deterioration.  Critical care was time spent personally by me on the following activities: development of treatment plan with patient and/or surrogate as well as nursing, discussions with consultants, evaluation of patient's response to treatment, examination of patient, obtaining history from patient or surrogate, ordering and performing treatments and interventions, ordering and review of laboratory studies, ordering and review of radiographic studies, pulse oximetry and re-evaluation of patient's condition.  For questions or updates, please contact [facility] Please consult [email or web address] for contact info under        Signed, Thurmon Fair, MD

## 2023-06-29 NOTE — Progress Notes (Signed)
PT Cancellation Note  Patient Details Name: [name] MRN: [id number] DOB: [birth year]   Cancelled Treatment:    Reason Eval/Treat Not Completed: Patient not medically ready (pt remains on vent with attempts to wean. Will sign off and await new order as pt medically stable and able to participate)   [name] 06/29/2023, 9:42 AM [name], PT Acute Rehabilitation Services Office: [phone]

## 2023-06-29 NOTE — Progress Notes (Signed)
Nutrition Follow-up  DOCUMENTATION CODES:   Severe malnutrition in context of chronic illness  INTERVENTION:   Tube Feeding via Cortrak:  Vital 1.5 at 60 ml/hr Pro-Source TF20 60 mL TID TF at goal provides 2400 kcals, 157 g of protein and 1094 mL of free water  Juven BID, each packet provides 80 calories, 8 grams of carbohydrate, 2.5  grams of protein (collagen), 7 grams of L-arginine and 7 grams of L-glutamine; supplement contains CaHMB, Vitamins C, E, B12 and Zinc to promote wound healing   NUTRITION DIAGNOSIS:   Severe Malnutrition related to chronic illness (HF) as evidenced by severe fat depletion, severe muscle depletion, edema.  Being addressed via TF  GOAL:   Patient will meet greater than or equal to 90% of their needs  Progressing  MONITOR:   Vent status, Labs, Weight trends, Skin, TF tolerance  REASON FOR ASSESSMENT:   Ventilator, Consult Enteral/tube feeding initiation and management (trickle tube feed only)  ASSESSMENT:   67 year old male with PMHx of follicular lymphoma in remission, RV failure, severe PAH, HTN, HLD, CAD, Afib/flutter, OSA/OHS, obesity, venous insufficiency, chronic lower extremity wounds who was admitted on 11/4 for lower extremity/groin swelling after presenting to outpatient wound care clinic found to have HF exacerbation with concomitant AKI.  11/04 Admitted from Wound Care Clinic for leg swelling (pt had not being taking diuretic as prescribed 11/09 11/10 Transferred to ICU, intubated, pressors initiated 11/11 Trickle TF initiated 11/12 TF titrated to goal 11/13 Cortrak placed  Pt remains on vent support, Levophed at 3, vasopressin off  Tolerating TF via Cortrak-Vital 1.5 at 60 ml/hr with Pro-Source TF20 TID  Weight 114.5 kg, noted net +1.7 L in past 24 hours, UOP only 1.3 L in 24 hours despite 60 mg IV lasix yesterday. Noted order for lsix 60 mg BID today. Weight not trending down. Pt still with significant edema on  exam  BUN/Creatinine trending up; BUN 95, Creatinine 1.48 today. Sodium up to 143  Ammonia 119 (H), MD ordering lactulose. +cardiac cirrhosis  Labs: sodium 143 (wdl), Creatinine 1.48, BUN 94, potassium 4.1 (wdl), CBGs 141-173 Meds: ss novolog, solucortef, lasix    Diet Order:   Diet Order             Diet NPO time specified  Diet effective now                   EDUCATION NEEDS:   No education needs have been identified at this time  Skin:  Skin Assessment: Skin Integrity Issues: Skin Integrity Issues:: DTI, Other (Comment) DTI: buttocks Other: pressure injury to buttocks, per RN wounds to bilateral lower extremities  Last BM:  11/15  Height:   Ht Readings from Last 1 Encounters:  06/29/23 6' (1.829 m)    Weight:   Wt Readings from Last 1 Encounters:  06/29/23 114.5 kg    Ideal Body Weight:  80.9 kg  BMI:  Body mass index is 34.24 kg/m.  Estimated Nutritional Needs:   Kcal:  2300-2500  Protein:  145-160g  Fluid:  2 L/day   Scott Starcher MS, RDN, LDN, CNSC Registered Dietitian 3 Clinical Nutrition RD Pager and On-Call Pager Number Located in [place]

## 2023-06-29 NOTE — Progress Notes (Signed)
NAME:  [name], MRN:  [id number], DOB:  08-Feb-1956, LOS: 11 ADMISSION DATE:  07/07/2023, CONSULTATION DATE:  06/24/23 REFERRING MD:  Duard Larsen - TRH, CHIEF COMPLAINT:  AMS   History of Present Illness:  67 yo M PMH follicular lymphoma in remission, RV failure, severe PAH g3, HTN, HLD, CAD, Afib/flutter, OSA/OHS, obesity, venous insufficiency, chronic lower extremity wounds who was admitted 11/4 for lower extremity/groin swelling after presenting to his outpt wound care clinic.  It was identified that pt was not taking diuretic as Rx. Admitted to HF exacerbation w concomitant AKI, cards consulted and aggressively diuresed 11/4-11/8  On 11/9 the pt was more altered. Ammonia was sent and resulted elevated 129. Lactulose started. Over the course of 11/9-11/10 night shift pt was progressively more lethargic and started to have low BP as well as low-grade temp which was new. TRH sent cx, started broad abx.  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 Discovered that he wasn't taking diuretic as Rx. AKI, HF exacerbation. Aggressive diuresis started 11/5 - 11/8 ongoing diuresis.  11/9 night AMS, hyperammonemia started lactulose 11/10 early AM hypotension worse AMS. CCM consult, ICU transfer  11/11 - LHC grafts patent, progression of native disease. LVEDP 27. RHC low SVR, high CI consistent with sepsis, but PCWP still high at 31.   Interim History / Subjective:   Tolerated SBT and was following commands off sedation yesterday afternoon.   Objective   Blood pressure 107/64, pulse 89, temperature 100 F (37.8 C), resp. rate 19, height 6' (1.829  m), weight 114.5 kg, SpO2 98%. PAP: (52-89)/(17-48) 74/34 CVP:  [5 mmHg-23 mmHg] 21 mmHg CO:  [8.1 L/min] 8.1 L/min CI:  [3.6 L/min/m2] 3.6 L/min/m2  Vent Mode: PRVC FiO2 (%):  [40 %] 40 % Set Rate:  [20 bmp] 20 bmp Vt Set:  [620 mL] 620 mL PEEP:  [5 cmH20] 5 cmH20 Pressure Support:  [5 cmH20-10 cmH20] 5 cmH20 Plateau Pressure:  [20 cmH20-24 cmH20] 20 cmH20   Intake/Output Summary (Last 24 hours) at 06/29/2023 0850 Last data filed at 06/29/2023 4098 Gross per 24 hour  Intake 2687.47 ml  Output 1265 ml  Net 1422.47 ml   Filed Weights   06/28/23 0500 06/28/23 0700 06/29/23 0400  Weight: 115.9 kg 115.9 kg 114.5 kg    Examination: General: overweight man on mechanical ventilation.  HENT: orally intubated, OGT in place  Lungs: Chest clear with no distress. Tolerating PSV with SBI in the 50's Cardiovascular: in rate controlled flutter.  Extremities are warmer.  Diffuse apex with RV lift. Loud S1/S2 but no murmur.  Abdomen: soft round. BS are present.  Extremities: BLE wrapped, edematous, mainly in dependent areas. ++ bruising.  Neuro:  Awake and following commands. Generalized weakness.  GU: scrotal edema much improved, Foley catheter in place.  Ancillary Tests Personally Reviewed:  Cultures are negative. Leukocytosis improving at 21 CO2  34 Creatinine now 1.48 AST/ALT are improving Ammonia 119 Assessment & Plan:   Mixed shock picture based on hemodynamics: distributive based on low SVR but likely also cardiogenic shock  component given elevated PCWP.  Demand related NSTEACS on background of HFpEF with cor pulmonale from COPD/OHS Chronic right heart  failure with PAH (group 3) with volume overload and cardiac cirrhosis.  Transaminitis due to worsening hepatic congestion vs shock liver - improving.  Aflutter - now rate controlled.  CAD s/p CABG x3, PCI - no evidence of new coronary obstruction.  HLD Hx HIT  Acute encephalopathy - multifactorial, likely shock related -  improving.  AKI - due to cardiorenal syndrome - improving.  Frailty with sarcopenia and falls at home  Plan:  -Titrate NE and VP to keep MAP >65. Wean VP once NE below 15. Should be able to come off today altogether. - Continue midodrine  - Continue stress steroids.  Will stop once off iv pressors.  - Filling pressures remain high, diurese today . May do better with lower intensity intermittent diuresis. - Follow creatinine particularly with diuresis.  RV function may not be sufficient to allow for peripheral fluid mobilization. - Tolerating SBT, mental status is improving. Weakness is a potential barrier to extubation  -Liver cirrhosis may be contributing to persistent vasoplegic shock and prolonged encephalopathy. - Continue amiodarone at 30 and consider oral transition soon.  - Continue Yupelri.  - Continue bivalirudin for Afib.   - Can resume hypotensive statin  - Complete 7 days of antibiotics empirically. Cultures are negative but hemodynamics are consistent with septic shock.  Follow leukocytosis.   - Continue wound care per WOC RN  Best Practice (right click and "Reselect all SmartList Selections" daily)   Diet/type: Continue tube feeds.  DVT prophylaxis: Bivalirudin GI prophylaxis: H2 blocker Lines: Right internal jugular  Foley:  N/A - external catheter.  Code Status:  full code Last date of multidisciplinary goals of care discussion [11/10 ]  CRITICAL CARE Performed by: Lynnell Catalan   Total critical care time: 40 minutes  Critical care time was exclusive of separately billable procedures and treating other patients.  Critical care was necessary to treat or prevent imminent or life-threatening deterioration.  Critical care was time spent personally by me on the following activities: development of treatment plan with patient and/or surrogate as well as nursing, discussions with consultants, evaluation of patient's response to treatment, examination of patient,  obtaining history from patient or surrogate, ordering and performing treatments and interventions, ordering and review of laboratory studies, ordering and review of radiographic studies, pulse oximetry, re-evaluation of patient's condition and participation in multidisciplinary rounds.  Lynnell Catalan, MD [facility] ICU Physician [facility]  Pager: [phone] Mobile: [phone] After hours: [phone].  06/29/2023, 8:50 AM

## 2023-06-29 NOTE — Progress Notes (Signed)
PHARMACY - ANTICOAGULATION CONSULT NOTE  Pharmacy Consult for bivalirudin Indication:  atrial flutter  Allergies  Allergen Reactions   Bendamustine Hcl     See hospital notes from 01/12/15, legs turned black   Heparin Other (See Comments)    Heparin induced thrombocytopenia    Bendamustine Hcl Rash    Severe   Tape Rash    If left on for prolong periods of time     Patient Measurements: Height: 6' (182.9 cm) Weight: 114.5 kg (252 lb 6.8 oz) IBW/kg (Calculated) : 77.6 Heparin Dosing Weight: 113.9 kg  Vital Signs: Temp: 100 F (37.8 C) (11/15 0630) Temp Source: Core (11/15 0400) BP: 107/64 (11/15 0630) Pulse Rate: 89 (11/15 0630)  Labs: Recent Labs    06/27/23 0242 06/27/23 0809 06/28/23 0501 06/28/23 0934 06/29/23 0314  HGB 8.3*   < > 8.3* 9.9* 7.9*  HCT 26.7*   < > 26.9* 29.0* 26.3*  PLT 157  --  133*  --  160  APTT 71*  --  82*  --  70*  CREATININE 1.63*  --  1.27*  --  1.48*   < > = values in this interval not displayed.    Estimated Creatinine Clearance: 61.6 mL/min (A) (by C-G formula based on SCr of 1.48 mg/dL (H)).   Medical History: Past Medical History:  Diagnosis Date   Anxiety    Basal cell carcinoma of nose    Blood dyscrasia    trouble clotting    Carotid artery disease (HCC)    a. s/p L CEA 2009 (hx of evacuation of hematoma due to heparin); b. 01/2022 Carotid U/S: 1-39% bilat stenosis.   Chronic heart failure with preserved ejection fraction (HFpEF) (HCC)    a. 04/2022 Echo: EF 60-65%, no rwma, GrII DD, nl RV fxn, RVSP 47.34mmHg. Mild BAE. Mild MR. Mild-mod TR.   Chronic lower back pain    CKD (chronic kidney disease), stage III (HCC)    COPD (chronic obstructive pulmonary disease) (HCC)    Coronary artery disease    a. 2005 s/p CABG x 3 (LIMA->LAD, VG->OM1, VG->dRCA); b. 09/2011 s/p PCI/DES of the VG->OM1 and VG->dRCA; c. 11/2017 PCI: LM 50ost, LAD 90ost/p, OM1 100 CTO, RCA 100p CTO, VG->dRCA 99 ISR (4.0x22 Resolute Onyx DES), VG->OM1 100  CTO/ISR, LIMA->LAD patent.   Depression    Diabetes mellitus without complication (HCC)    borderline    Dysrhythmia    fluttering   Edema    GERD (gastroesophageal reflux disease)    Heart murmur    Heparin induced thrombocytopenia (HCC)    History of home oxygen therapy    2 liters at night prn   Hyperlipidemia    Hypertension    Lymphoma (HCC) 12/21/2014   Myocardial infarction (HCC)    Normocytic anemia    Obesity    Sleep apnea    a. Not using CPAP.    Medications:  Medications Prior to Admission  Medication Sig Dispense Refill Last Dose   albuterol (PROVENTIL) (2.5 MG/3ML) 0.083% nebulizer solution INHALE 1 VIAL VIA NEBULIZER EVERY DAY AS NEEDED FOR WHEEZING/SHORTNESS OF BREATH 75 mL 5 Past Month   aspirin EC 81 MG tablet Take 81 mg by mouth every evening.    06/17/2023   aspirin-sod bicarb-citric acid (ALKA-SELTZER) 325 MG TBEF tablet Take 650 mg by mouth every 6 (six) hours as needed (indigestion).   Past Week   clonazePAM (KLONOPIN) 1 MG tablet Take 1 tablet (1 mg total) by mouth at bedtime. 30  tablet 5 06/17/2023   clopidogrel (PLAVIX) 75 MG tablet TAKE 1 TABLET BY MOUTH EVERY DAY WITH BREAKFAST 90 tablet 3 06/28/2023 at AM   metolazone (ZAROXOLYN) 5 MG tablet Take 1 tablet (5 mg total) by mouth daily as needed (swelling). 90 tablet 1 06/17/2023   metoprolol succinate (TOPROL-XL) 25 MG 24 hr tablet Take 0.5 tablets (12.5 mg total) by mouth daily. Take with or immediately following a meal.   06/17/2023   nitroGLYCERIN (NITROSTAT) 0.4 MG SL tablet Place 1 tablet (0.4 mg total) under the tongue every 5 (five) minutes as needed for chest pain (Up to 3 doses). 25 tablet 1 unknown   oxyCODONE-acetaminophen (PERCOCET) 7.5-325 MG tablet Take 1 tablet by mouth every 6 (six) hours as needed for severe pain. 120 tablet 0 07/14/2023   oxymetazoline (AFRIN) 0.05 % nasal spray Place 1 spray into both nostrils 2 (two) times daily as needed for congestion.   unknown   simvastatin (ZOCOR) 20 MG  tablet Take 1 tablet (20 mg total) by mouth at bedtime. 90 tablet 3 06/17/2023   torsemide (DEMADEX) 20 MG tablet Take 20 mg by mouth daily.   06/17/2023   senna-docusate (SENOKOT-S) 8.6-50 MG tablet Take 1-2 tablets by mouth at bedtime. (Patient not taking: Reported on 07/03/2023) 100 tablet 6 Not Taking   Scheduled:   acetaZOLAMIDE  500 mg Per Tube BID   budesonide (PULMICORT) nebulizer solution  0.25 mg Nebulization BID   Chlorhexidine Gluconate Cloth  6 each Topical Daily   clopidogrel  75 mg Per NG tube Daily   famotidine  20 mg Per Tube Daily   feeding supplement (PROSource TF20)  60 mL Per Tube TID   hydrocortisone sod succinate (SOLU-CORTEF) inj  100 mg Intravenous Q12H   insulin aspart  2-6 Units Subcutaneous Q4H   liver oil-zinc oxide   Topical BID   midodrine  10 mg Per Tube TID WC   nutrition supplement (JUVEN)  1 packet Per Tube BID BM   mouth rinse  15 mL Mouth Rinse Q2H   polyethylene glycol  17 g Per Tube Daily   revefenacin  175 mcg Nebulization Daily   senna-docusate  1 tablet Per Tube BID   sodium chloride flush  3 mL Intravenous Q12H   Infusions:   amiodarone 30 mg/hr (06/29/23 0600)   bivalirudin (ANGIOMAX) 250 mg in sodium chloride 0.9 % 500 mL (0.5 mg/mL) infusion 0.05 mg/kg/hr (06/29/23 0600)   ceFEPime (MAXIPIME) IV Stopped (06/29/23 0108)   feeding supplement (VITAL 1.5 CAL) 1,000 mL (06/29/23 0627)   norepinephrine (LEVOPHED) Adult infusion 7 mcg/min (06/29/23 0600)   vasopressin 0.02 Units/min (06/29/23 0607)   PRN: acetaminophen, fentaNYL, fentaNYL (SUBLIMAZE) injection, fentaNYL (SUBLIMAZE) injection, ondansetron (ZOFRAN) IV, mouth rinse, senna-docusate, sodium chloride flush  Assessment: 89 YOM with PMH of follicular lymphoma in remission, RV failure, severe PAH, HTN, HLD, CAD, Afib/flutter, OSA, chronic lower extremity wounds. Returned to ICU 11/10 with worsening fever, hypotension consistent with septic shock. Patient is also persistently in either AF or  Aflutter. Previously patient was on Eliquis and is now on bivalirudin with plans for possible procedures and history of HIT  Underwent cardiac cath 11/11 finding severe multivessel CAD -aPTT at goal on bivalirudin 0.05 mg/kg/hr, hg stable  Goal of Therapy:  aPTT 50-85 seconds Monitor platelets by anticoagulation protocol: Yes   Plan:  Continue bivalirudin at 0.05 mg/kg/hour  Monitor daily aPTT, CBC, and for s/sx of bleeding  Thank you for allowing pharmacy to participate in this patient's  care,  Harland German, PharmD Clinical Pharmacist **Pharmacist phone directory can now be found on [email or web address] ([email or web address]).  Listed under [facility].   Harland German, PharmD Clinical Pharmacist **Pharmacist phone directory can now be found on [email or web address] ([email or web address]).  Listed under [facility].

## 2023-06-30 DIAGNOSIS — R570 Cardiogenic shock: Secondary | ICD-10-CM | POA: Diagnosis not present

## 2023-06-30 DIAGNOSIS — I5033 Acute on chronic diastolic (congestive) heart failure: Secondary | ICD-10-CM | POA: Diagnosis not present

## 2023-06-30 LAB — COMPREHENSIVE METABOLIC PANEL
ALT: 133 U/L — ABNORMAL HIGH (ref 0–44)
AST: 50 U/L — ABNORMAL HIGH (ref 15–41)
Albumin: 2.2 g/dL — ABNORMAL LOW (ref 3.5–5.0)
Alkaline Phosphatase: 74 U/L (ref 38–126)
Anion gap: 8 (ref 5–15)
BUN: 150 mg/dL — ABNORMAL HIGH (ref 8–23)
CO2: 34 mmol/L — ABNORMAL HIGH (ref 22–32)
Calcium: 7.9 mg/dL — ABNORMAL LOW (ref 8.9–10.3)
Chloride: 98 mmol/L (ref 98–111)
Creatinine, Ser: 1.87 mg/dL — ABNORMAL HIGH (ref 0.61–1.24)
GFR, Estimated: 38 mL/min — ABNORMAL LOW (ref >60)
Glucose, Bld: 146 mg/dL — ABNORMAL HIGH (ref 70–99)
Potassium: 4 mmol/L (ref 3.5–5.1)
Sodium: 140 mmol/L (ref 135–145)
Total Bilirubin: 0.9 mg/dL (ref <1.2)
Total Protein: 5.5 g/dL — ABNORMAL LOW (ref 6.5–8.1)

## 2023-06-30 LAB — CBC
HCT: 25.2 % — ABNORMAL LOW (ref 39.0–52.0)
Hemoglobin: 7.3 g/dL — ABNORMAL LOW (ref 13.0–17.0)
MCH: 28.5 pg (ref 26.0–34.0)
MCHC: 29 g/dL — ABNORMAL LOW (ref 30.0–36.0)
MCV: 98.4 fL (ref 80.0–100.0)
Platelets: 170 10*3/uL (ref 150–400)
RBC: 2.56 MIL/uL — ABNORMAL LOW (ref 4.22–5.81)
RDW: 22.5 % — ABNORMAL HIGH (ref 11.5–15.5)
WBC: 14.2 10*3/uL — ABNORMAL HIGH (ref 4.0–10.5)
nRBC: 0.4 % — ABNORMAL HIGH (ref 0.0–0.2)

## 2023-06-30 LAB — GLUCOSE, CAPILLARY
Glucose-Capillary: 129 mg/dL — ABNORMAL HIGH (ref 70–99)
Glucose-Capillary: 143 mg/dL — ABNORMAL HIGH (ref 70–99)
Glucose-Capillary: 169 mg/dL — ABNORMAL HIGH (ref 70–99)
Glucose-Capillary: 150 mg/dL — ABNORMAL HIGH (ref 70–99)
Glucose-Capillary: 144 mg/dL — ABNORMAL HIGH (ref 70–99)
Glucose-Capillary: 161 mg/dL — ABNORMAL HIGH (ref 70–99)

## 2023-06-30 LAB — APTT: aPTT: 67 s — ABNORMAL HIGH (ref 24–36)

## 2023-06-30 MED ORDER — FUROSEMIDE 10 MG/ML IJ SOLN: 40.0000 mg | Freq: Two times a day (BID) | INTRAMUSCULAR | Status: DC

## 2023-06-30 MED ORDER — LACTULOSE 10 GM/15ML PO SOLN
20.0000 g | Freq: Three times a day (TID) | ORAL | Status: DC
  Administered 2023-06-30 (×2): 20 g
  Filled 2023-06-30 (×2): qty 30

## 2023-06-30 MED ORDER — ALBUTEROL SULFATE (2.5 MG/3ML) 0.083% IN NEBU
2.5000 mg | INHALATION_SOLUTION | RESPIRATORY_TRACT | Status: DC | PRN
  Administered 2023-06-30: 2.5 mg via RESPIRATORY_TRACT
  Filled 2023-06-30: qty 3

## 2023-06-30 MED ORDER — FUROSEMIDE 10 MG/ML IJ SOLN
60.0000 mg | Freq: Two times a day (BID) | INTRAMUSCULAR | Status: DC
  Administered 2023-06-30: 60 mg via INTRAVENOUS
  Filled 2023-06-30: qty 6

## 2023-06-30 NOTE — Progress Notes (Signed)
PHARMACY - ANTICOAGULATION CONSULT NOTE  Pharmacy Consult for bivalirudin Indication:  atrial flutter  Allergies  Allergen Reactions   Bendamustine Hcl     See hospital notes from 01/12/15, legs turned black   Heparin Other (See Comments)    Heparin induced thrombocytopenia    Bendamustine Hcl Rash    Severe   Tape Rash    If left on for prolong periods of time     Patient Measurements: Height: 6' (182.9 cm) Weight: 115.3 kg (254 lb 3.1 oz) IBW/kg (Calculated) : 77.6 Heparin Dosing Weight: 113.9 kg  Vital Signs: Temp: 99.9 F (37.7 C) (11/16 1000) Temp Source: Core (11/16 0832) BP: 107/60 (11/16 1000) Pulse Rate: 81 (11/16 1000)  Labs: Recent Labs    06/28/23 0501 06/28/23 0934 06/29/23 0314 06/30/23 0406  HGB 8.3* 9.9* 7.9* 7.3*  HCT 26.9* 29.0* 26.3* 25.2*  PLT 133*  --  160 170  APTT 82*  --  70* 67*  CREATININE 1.27*  --  1.48* 1.87*    Estimated Creatinine Clearance: 48.9 mL/min (A) (by C-G formula based on SCr of 1.87 mg/dL (H)).   Medical History: Past Medical History:  Diagnosis Date   Anxiety    Basal cell carcinoma of nose    Blood dyscrasia    trouble clotting    Carotid artery disease (HCC)    a. s/p L CEA 2009 (hx of evacuation of hematoma due to heparin); b. 01/2022 Carotid U/S: 1-39% bilat stenosis.   Chronic heart failure with preserved ejection fraction (HFpEF) (HCC)    a. 04/2022 Echo: EF 60-65%, no rwma, GrII DD, nl RV fxn, RVSP 47.21mmHg. Mild BAE. Mild MR. Mild-mod TR.   Chronic lower back pain    CKD (chronic kidney disease), stage III (HCC)    COPD (chronic obstructive pulmonary disease) (HCC)    Coronary artery disease    a. 2005 s/p CABG x 3 (LIMA->LAD, VG->OM1, VG->dRCA); b. 09/2011 s/p PCI/DES of the VG->OM1 and VG->dRCA; c. 11/2017 PCI: LM 50ost, LAD 90ost/p, OM1 100 CTO, RCA 100p CTO, VG->dRCA 99 ISR (4.0x22 Resolute Onyx DES), VG->OM1 100 CTO/ISR, LIMA->LAD patent.   Depression    Diabetes mellitus without complication (HCC)     borderline    Dysrhythmia    fluttering   Edema    GERD (gastroesophageal reflux disease)    Heart murmur    Heparin induced thrombocytopenia (HCC)    History of home oxygen therapy    2 liters at night prn   Hyperlipidemia    Hypertension    Lymphoma (HCC) 12/21/2014   Myocardial infarction (HCC)    Normocytic anemia    Obesity    Sleep apnea    a. Not using CPAP.    Medications:  Medications Prior to Admission  Medication Sig Dispense Refill Last Dose   albuterol (PROVENTIL) (2.5 MG/3ML) 0.083% nebulizer solution INHALE 1 VIAL VIA NEBULIZER EVERY DAY AS NEEDED FOR WHEEZING/SHORTNESS OF BREATH 75 mL 5 Past Month   aspirin EC 81 MG tablet Take 81 mg by mouth every evening.    06/17/2023   aspirin-sod bicarb-citric acid (ALKA-SELTZER) 325 MG TBEF tablet Take 650 mg by mouth every 6 (six) hours as needed (indigestion).   Past Week   clonazePAM (KLONOPIN) 1 MG tablet Take 1 tablet (1 mg total) by mouth at bedtime. 30 tablet 5 06/17/2023   clopidogrel (PLAVIX) 75 MG tablet TAKE 1 TABLET BY MOUTH EVERY DAY WITH BREAKFAST 90 tablet 3 07/08/2023 at AM   metolazone (ZAROXOLYN) 5  MG tablet Take 1 tablet (5 mg total) by mouth daily as needed (swelling). 90 tablet 1 06/17/2023   metoprolol succinate (TOPROL-XL) 25 MG 24 hr tablet Take 0.5 tablets (12.5 mg total) by mouth daily. Take with or immediately following a meal.   06/17/2023   nitroGLYCERIN (NITROSTAT) 0.4 MG SL tablet Place 1 tablet (0.4 mg total) under the tongue every 5 (five) minutes as needed for chest pain (Up to 3 doses). 25 tablet 1 unknown   oxyCODONE-acetaminophen (PERCOCET) 7.5-325 MG tablet Take 1 tablet by mouth every 6 (six) hours as needed for severe pain. 120 tablet 0 06/15/2023   oxymetazoline (AFRIN) 0.05 % nasal spray Place 1 spray into both nostrils 2 (two) times daily as needed for congestion.   unknown   simvastatin (ZOCOR) 20 MG tablet Take 1 tablet (20 mg total) by mouth at bedtime. 90 tablet 3 06/17/2023   torsemide  (DEMADEX) 20 MG tablet Take 20 mg by mouth daily.   06/17/2023   senna-docusate (SENOKOT-S) 8.6-50 MG tablet Take 1-2 tablets by mouth at bedtime. (Patient not taking: Reported on 07/08/2023) 100 tablet 6 Not Taking   Scheduled:   acetaZOLAMIDE  500 mg Per Tube BID   amiodarone  200 mg Per NG tube BID   atorvastatin  40 mg Per Tube Daily   budesonide (PULMICORT) nebulizer solution  0.25 mg Nebulization BID   Chlorhexidine Gluconate Cloth  6 each Topical Daily   clopidogrel  75 mg Per NG tube Daily   famotidine  20 mg Per Tube Daily   feeding supplement (PROSource TF20)  60 mL Per Tube TID   furosemide  40 mg Intravenous BID   hydrocortisone sod succinate (SOLU-CORTEF) inj  100 mg Intravenous Q12H   insulin aspart  2-6 Units Subcutaneous Q4H   lactulose  10 g Per Tube BID   liver oil-zinc oxide   Topical BID   midodrine  10 mg Per Tube TID WC   nutrition supplement (JUVEN)  1 packet Per Tube BID BM   mouth rinse  15 mL Mouth Rinse Q2H   polyethylene glycol  17 g Per Tube Daily   revefenacin  175 mcg Nebulization Daily   senna-docusate  1 tablet Per Tube BID   sodium chloride flush  3 mL Intravenous Q12H   Infusions:   bivalirudin (ANGIOMAX) 250 mg in sodium chloride 0.9 % 500 mL (0.5 mg/mL) infusion 0.05 mg/kg/hr (06/30/23 0700)   ceFEPime (MAXIPIME) IV 2 g (06/30/23 0938)   feeding supplement (VITAL 1.5 CAL) 60 mL/hr at 06/30/23 0700   norepinephrine (LEVOPHED) Adult infusion 3 mcg/min (06/30/23 0700)   vasopressin Stopped (06/29/23 1054)   PRN: acetaminophen, albuterol, fentaNYL, fentaNYL (SUBLIMAZE) injection, fentaNYL (SUBLIMAZE) injection, ondansetron (ZOFRAN) IV, mouth rinse, senna-docusate, sodium chloride flush  Assessment: 4 YOM with PMH of follicular lymphoma in remission, RV failure, severe PAH, HTN, HLD, CAD, Afib/flutter, OSA, chronic lower extremity wounds. Returned to ICU 11/10 with worsening fever, hypotension consistent with septic shock. Patient is also persistently  in either AF or Aflutter. Previously patient was on Eliquis and is now on bivalirudin with plans for possible procedures and history of HIT  Underwent cardiac cath 11/11 finding severe multivessel CAD -aPTT 67sec at goal on bivalirudin 0.05 mg/kg/hr, HGB slow drift down7s - no bleeding noted pltc stable 170s  Goal of Therapy:  aPTT 50-85 seconds Monitor platelets by anticoagulation protocol: Yes   Plan:  Continue bivalirudin at 0.05 mg/kg/hour  Monitor daily aPTT, CBC, and for s/sx of bleeding  Leota Sauers Pharm.D. CPP, BCPS Clinical Pharmacist [phone] 06/30/2023 12:39 PM

## 2023-06-30 NOTE — Progress Notes (Signed)
NAME:  [name], MRN:  [id number], DOB:  [birth year], LOS: 12 ADMISSION DATE:  07/13/2023, CONSULTATION DATE:  06/24/23 REFERRING MD:  Duard Larsen - TRH, CHIEF COMPLAINT:  AMS   History of Present Illness:  67 yo M PMH follicular lymphoma in remission, RV failure, severe PAH g3, HTN, HLD, CAD, Afib/flutter, OSA/OHS, obesity, venous insufficiency, chronic lower extremity wounds who was admitted 11/4 for lower extremity/groin swelling after presenting to his outpt wound care clinic.  It was identified that pt was not taking diuretic as Rx. Admitted to HF exacerbation w concomitant AKI, cards consulted and aggressively diuresed 11/4-11/8  On 11/9 the pt was more altered. Ammonia was sent and resulted elevated 129. Lactulose started. Over the course of 11/9-11/10 night shift pt was progressively more lethargic and started to have low BP as well as low-grade temp which was new. TRH sent cx, started broad abx.  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 Discovered that he wasn't taking diuretic as Rx. AKI, HF exacerbation. Aggressive diuresis started 11/5 - 11/8 ongoing diuresis.  11/9 night AMS, hyperammonemia started lactulose 11/10 early AM hypotension worse AMS. CCM consult, ICU transfer  11/11 - LHC grafts patent, progression of native disease. LVEDP 27. RHC low SVR, high CI consistent with sepsis, but PCWP still high at 31.   Interim History / Subjective:   Still remains very weak and tires after short period on SBT.   Objective   Blood pressure 93/67, pulse 80, temperature 100.2 F (37.9 C), resp. rate (!) 23, height 6' (1.829 m),  weight 115.3 kg, SpO2 100%. PAP: (57-77)/(18-37) 74/30 CVP:  [14 mmHg-21 mmHg] 18 mmHg PCWP:  [28 mmHg] 28 mmHg CO:  [7.1 L/min-7.9 L/min] 7.1 L/min CI:  [3.17 L/min/m2-3.51 L/min/m2] 3.17 L/min/m2  Vent Mode: CPAP;PSV FiO2 (%):  [40 %] 40 % Set Rate:  [20 bmp] 20 bmp Vt Set:  [620 mL] 620 mL PEEP:  [5 cmH20] 5 cmH20 Pressure Support:  [5 cmH20-10 cmH20] 10 cmH20 Plateau Pressure:  [20 cmH20-26 cmH20] 23 cmH20   Intake/Output Summary (Last 24 hours) at 06/30/2023 0856 Last data filed at 06/30/2023 0700 Gross per 24 hour  Intake 2333.11 ml  Output 1400 ml  Net 933.11 ml   Filed Weights   06/28/23 0700 06/29/23 0400 06/30/23 0500  Weight: 115.9 kg 114.5 kg 115.3 kg    Examination: General: overweight man on mechanical ventilation.  HENT: orally intubated, OGT in place  Lungs: Chest clear. Initially tolerates SBT but starts having increased work of breathing.  Cardiovascular: in rate controlled flutter.  Extremities are warmer.  Diffuse apex with RV lift. Loud S1/S2 but no murmur.  Abdomen: soft round. BS are present.  Extremities: BLE wrapped, edematous, mainly in dependent areas. ++ bruising.  Neuro:  Awake and following commands. Generalized weakness, can barely move against gravity in the extremities, unable to lift head of the the bed.  GU: scrotal edema much improved, Foley catheter in place.  Ancillary Tests Personally Reviewed:  Cultures are negative. Leukocytosis improving at 14.2 CO2  34 Creatinine now 1.87 AST/ALT are improving Ammonia 119 Assessment & Plan:   Mixed shock picture based on hemodynamics: distributive based on low  SVR but likely also cardiogenic shock  component given elevated PCWP.  Demand related NSTEACS on background of HFpEF with cor pulmonale from COPD/OHS Chronic right heart failure with PAH (group 3) with volume overload and cardiac cirrhosis.  Transaminitis due to worsening hepatic congestion vs shock liver - improving.  Aflutter - now  rate controlled.  CAD s/p CABG x3, PCI - no evidence of new coronary obstruction.  HLD Hx HIT  Acute encephalopathy - multifactorial, likely shock related - improving.  AKI - due to cardiorenal syndrome - improving.  Frailty with sarcopenia and falls at home  Plan:  -Titrate NE  to keep MAP >65.  - Continue midodrine  - Will taper stress steroids now.  - Filling pressures remain high. Continue to try and diurese gently - Follow creatinine particularly with diuresis.  RV function may not be sufficient to allow for peripheral fluid mobilization. 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 - Continue amiodarone orally - Continue Yupelri.  - Continue bivalirudin for Afib.   - Resumed high potency  statin  - Complete 7 days of antibiotics empirically. Cultures are negative but hemodynamics are consistent with septic shock.  Follow leukocytosis.   - Continue wound care per WOC RN - While he has made progress from the beginning of the week, he remains very weak and I doubt that he will tolerate extubation. He has been off continuous sedation for 48h and still remains too weak to safely extubate. This is likely as good as he is going to get. I fear that reintubation, or tracheostomy would only prolong his eventual decline.  I have spoken to the patient's wife and daughter and have shared my concerns with them and have suggested that a transition to comfort care might be a better option given his end-stage heart failure and frailty.    Best Practice (right click and "Reselect all SmartList Selections" daily)   Diet/type: Continue tube feeds.  DVT prophylaxis: Bivalirudin GI prophylaxis: H2 blocker Lines: Right internal jugular  Foley:  N/A - external catheter.  Code Status:  full code Last date of multidisciplinary goals of care discussion [11/16 - as documented above]  CRITICAL CARE Performed by: Lynnell Catalan   Total critical care  time: 40 minutes  Critical care time was exclusive of separately billable procedures and treating other patients.  Critical care was necessary to treat or prevent imminent or life-threatening deterioration.  Critical care was time spent personally by me on the following activities: development of treatment plan with patient and/or surrogate as well as nursing, discussions with consultants, evaluation of patient's response to treatment, examination of patient, obtaining history from patient or surrogate, ordering and performing treatments and interventions, ordering and review of laboratory studies, ordering and review of radiographic studies, pulse oximetry, re-evaluation of patient's condition and participation in multidisciplinary rounds.  Lynnell Catalan, MD [facility] ICU Physician [facility] [place] [facility]  Pager: [phone] Mobile: [phone] After hours: [phone].  06/30/2023, 8:56 AM

## 2023-07-01 DIAGNOSIS — Z515 Encounter for palliative care: Secondary | ICD-10-CM

## 2023-07-01 DIAGNOSIS — I5033 Acute on chronic diastolic (congestive) heart failure: Secondary | ICD-10-CM | POA: Diagnosis not present

## 2023-07-01 DIAGNOSIS — Z7189 Other specified counseling: Secondary | ICD-10-CM | POA: Diagnosis not present

## 2023-07-01 DIAGNOSIS — R570 Cardiogenic shock: Secondary | ICD-10-CM | POA: Diagnosis not present

## 2023-07-01 LAB — BASIC METABOLIC PANEL
Anion gap: 8 (ref 5–15)
BUN: 152 mg/dL — ABNORMAL HIGH (ref 8–23)
CO2: 33 mmol/L — ABNORMAL HIGH (ref 22–32)
Calcium: 7.9 mg/dL — ABNORMAL LOW (ref 8.9–10.3)
Chloride: 103 mmol/L (ref 98–111)
Creatinine, Ser: 2.1 mg/dL — ABNORMAL HIGH (ref 0.61–1.24)
GFR, Estimated: 33 mL/min — ABNORMAL LOW (ref >60)
Glucose, Bld: 185 mg/dL — ABNORMAL HIGH (ref 70–99)
Potassium: 3.6 mmol/L (ref 3.5–5.1)
Sodium: 144 mmol/L (ref 135–145)

## 2023-07-01 LAB — CBC
HCT: 23.9 % — ABNORMAL LOW (ref 39.0–52.0)
Hemoglobin: 7.1 g/dL — ABNORMAL LOW (ref 13.0–17.0)
MCH: 29 pg (ref 26.0–34.0)
MCHC: 29.7 g/dL — ABNORMAL LOW (ref 30.0–36.0)
MCV: 97.6 fL (ref 80.0–100.0)
Platelets: 152 10*3/uL (ref 150–400)
RBC: 2.45 MIL/uL — ABNORMAL LOW (ref 4.22–5.81)
RDW: 22.1 % — ABNORMAL HIGH (ref 11.5–15.5)
WBC: 10 10*3/uL (ref 4.0–10.5)
nRBC: 0.2 % (ref 0.0–0.2)

## 2023-07-01 LAB — GLUCOSE, CAPILLARY
Glucose-Capillary: 174 mg/dL — ABNORMAL HIGH (ref 70–99)
Glucose-Capillary: 175 mg/dL — ABNORMAL HIGH (ref 70–99)
Glucose-Capillary: 193 mg/dL — ABNORMAL HIGH (ref 70–99)

## 2023-07-01 LAB — APTT: aPTT: 61 s — ABNORMAL HIGH (ref 24–36)

## 2023-07-01 LAB — AMMONIA: Ammonia: 111 umol/L — ABNORMAL HIGH (ref 9–35)

## 2023-07-01 LAB — MAGNESIUM: Magnesium: 3.5 mg/dL — ABNORMAL HIGH (ref 1.7–2.4)

## 2023-07-01 MED ORDER — GLYCOPYRROLATE 0.2 MG/ML IJ SOLN
0.2000 mg | INTRAMUSCULAR | Status: DC | PRN
  Administered 2023-07-01: 0.2 mg via INTRAVENOUS

## 2023-07-01 MED ORDER — RIFAXIMIN 550 MG PO TABS
550.0000 mg | ORAL_TABLET | Freq: Two times a day (BID) | ORAL | Status: DC
  Administered 2023-07-01: 550 mg via ORAL
  Filled 2023-07-01 (×2): qty 1

## 2023-07-01 MED ORDER — HYDROMORPHONE HCL-NACL 50-0.9 MG/50ML-% IV SOLN
1.0000 mg/h | INTRAVENOUS | Status: DC
  Administered 2023-07-01: 2 mg/h via INTRAVENOUS
  Filled 2023-07-01: qty 50

## 2023-07-01 MED ORDER — POLYVINYL ALCOHOL 1.4 % OP SOLN: 1.0000 [drp] | Freq: Four times a day (QID) | OPHTHALMIC | Status: DC | PRN

## 2023-07-01 MED ORDER — BIOTENE DRY MOUTH MT LIQD: 15.0000 mL | OROMUCOSAL | Status: DC | PRN

## 2023-07-01 MED ORDER — MIDAZOLAM BOLUS VIA INFUSION: 2.0000 mg | INTRAVENOUS | Status: DC | PRN

## 2023-07-01 MED ORDER — GLYCOPYRROLATE 0.2 MG/ML IJ SOLN
0.2000 mg | INTRAMUSCULAR | Status: DC | PRN
  Filled 2023-07-01: qty 1

## 2023-07-01 MED ORDER — POTASSIUM CHLORIDE 20 MEQ PO PACK
20.0000 meq | PACK | Freq: Once | ORAL | Status: AC
  Administered 2023-07-01: 20 meq via ORAL
  Filled 2023-07-01: qty 1

## 2023-07-01 MED ORDER — ACETAMINOPHEN 325 MG PO TABS: 650.0000 mg | ORAL_TABLET | Freq: Four times a day (QID) | ORAL | Status: DC | PRN

## 2023-07-01 MED ORDER — GLYCOPYRROLATE 1 MG PO TABS
1.0000 mg | ORAL_TABLET | ORAL | Status: DC | PRN
Start: 2023-07-01 — End: 2023-07-01

## 2023-07-01 MED ORDER — LACTULOSE 10 GM/15ML PO SOLN
30.0000 g | Freq: Three times a day (TID) | ORAL | Status: DC
  Administered 2023-07-01: 30 g
  Filled 2023-07-01: qty 45

## 2023-07-01 MED ORDER — ACETAMINOPHEN 650 MG RE SUPP: 650.0000 mg | Freq: Four times a day (QID) | RECTAL | Status: DC | PRN

## 2023-07-01 MED ORDER — MIDAZOLAM-SODIUM CHLORIDE 100-0.9 MG/100ML-% IV SOLN
1.0000 mg/h | INTRAVENOUS | Status: DC
  Administered 2023-07-01: 1 mg/h via INTRAVENOUS
  Filled 2023-07-01: qty 100

## 2023-07-01 MED ORDER — HYDROMORPHONE BOLUS VIA INFUSION
0.5000 mg | INTRAVENOUS | Status: DC | PRN
Start: 2023-07-01 — End: 2023-07-02

## 2023-07-01 MED ORDER — ACETAMINOPHEN 160 MG/5ML PO SOLN: 650.0000 mg | ORAL | Status: DC | PRN

## 2023-07-03 ENCOUNTER — Ambulatory Visit (HOSPITAL_BASED_OUTPATIENT_CLINIC_OR_DEPARTMENT_OTHER): Payer: Medicare Other | Admitting: General Surgery

## 2023-07-05 ENCOUNTER — Encounter (HOSPITAL_COMMUNITY): Payer: Medicare Other

## 2023-07-15 NOTE — Discharge Summary (Signed)
DEATH SUMMARY   Patient Details  Name: [name] MRN: [id number] DOB: 1956-06-23  Admission/Discharge Information   Admit Date:  06-26-23  Date of Death: Date of Death: July 09, 2023  Time of Death: Time of Death: 07/22/10  Length of Stay: 2023/08/04  Referring Physician: Tresa Garter, MD   Reason(s) for Hospitalization  Decompensated right heart failure  Diagnoses  Preliminary cause of death: Decompensated right heart failure Secondary Diagnoses (including complications and co-morbidities):  Principal Problem:   Acute on chronic diastolic CHF (congestive heart failure) (HCC) Active Problems:   Essential hypertension   COPD mixed type (HCC)   Thrombocytopenia (HCC)   CAD S/P percutaneous coronary angioplasty   Falls   History of follicular lymphoma   Open wound of lower extremity without complication, subsequent encounter   Obesity (BMI 30-39.9)   Acute kidney injury superimposed on chronic kidney disease (HCC)   Atrial flutter (HCC)   Septic shock (HCC)   RVF (right ventricular failure) (HCC)   PAH (pulmonary artery hypertension) (HCC)   Hyperammonemia (HCC)   Encephalopathy acute   Hypoglycemia   Lactic acidosis   AKI (acute kidney injury) (HCC)   Goals of care, counseling/discussion   Protein-calorie malnutrition, severe   Brief Hospital Course (including significant findings, care, treatment, and services provided and events leading to death)  [name] is a 67 y.o. year old male who presented with anasarca from known severe RV failure from [facility] group 3 pulmonary hypertension.  He was initially admitted to the hospitalist service and underwent aggressive diuresis with significant improvement in his peripheral edema.  Unfortunately, on the fifth day of hospitalization he became markedly lethargic and was intubated for airway protection.  He was found to have a markedly elevated ammonia and lactulose was started.  He was diagnosed with cardiac  cirrhosis.  A pulmonary artery catheter was placed to direct hemodynamic management.  He was found to be vasodilated.  It was thought that he may be septic but with no clear source.  Hemodynamics did improve after several days of antibiotics. Despite elevated filling pressures he did not tolerate further diuresis due to rising creatinine. After week of mechanical ventilation he remained very somnolent off sedation and did not tolerate SBT in a sustained manner.  He also appeared to be significantly deconditioned.  We had a goals of care meeting with the family and pointed out to them that he is now suffered from significant deconditioning which would further limit his recovery and that he would likely require tracheostomy.  Given his severe right heart failure, we felt that it was unlikely that he would separate from mechanical ventilation in the long run.  Living in such a state of dependency was not according to his family consistent with his wishes.  He was therefore transition to comfort care.  Pertinent Labs and Studies  Significant Diagnostic Studies DG Abd Portable 1V  Result Date: 06/27/2023 CLINICAL DATA:  [id number] Encounter for feeding tube placement [phone]. EXAM: PORTABLE ABDOMEN - 1 VIEW COMPARISON:  Abdominal radiograph 06/24/2023. FINDINGS: Interval placement of a feeding tube with tip projecting over the stomach. IMPRESSION: Interval placement of a feeding tube with tip projecting over the stomach. Electronically Signed   By: Orvan Falconer M.D.   On: 06/27/2023 13:55   DG CHEST PORT 1 VIEW  Result Date: 06/26/2023 CLINICAL DATA:  Respiratory failure.  Ventilator support. EXAM: PORTABLE CHEST 1 VIEW COMPARISON:  06/24/2023 FINDINGS: Endotracheal tube tip 6 cm above the carina. Orogastric or nasogastric  tube enters the abdomen. Swan-Ganz catheter introduced from a right internal jugular approach has its tip in the right middle or lower lobe pulmonary artery. Previous median sternotomy  and CABG. Enlarged cardiac silhouette as seen previously. Mild venous hypertension/interstitial edema. Mild atelectasis at the left lung base. Findings are similar to the prior exam. IMPRESSION: No change since the prior exam. Cardiomegaly. Mild venous hypertension/interstitial edema. Mild atelectasis at the left lung base. Electronically Signed   By: Paulina Fusi M.D.   On: 06/26/2023 12:23   CARDIAC CATHETERIZATION  Result Date: 06/26/2023 Coronary angiography 06/19/2023: LM: Ostial to distal 70% stenosis (Progressed form 50% stenosis in 2019) LAD: Mid occlusion LIMA-LAD: Patent.                    Ostial spam improved with IA NTG. No stenosis Lcx: Mid 30% disease Ramus: Proximal occlusion SVG-Ramus: Ostial occlusion RCA: Ostial occlusion SVG-PDA: Patent. No significant disease LVEDP 27 mmHg Right heart catheterization 06/21/2023: RA: 12 mmHg RV: 59/4 mmHg PA: 60/29 mmHg, mPAP 42 mmHg PCW: 31 mmHg AO sats: 99% PA sats: 76% CO: 12.6 L/min CI: 5.6 L/min/m2 SVR: 343 Dynes Conclusion: Severe multivessel CAD No new unprotected vessel occlusion LM disease appears progressed since 2019 Circulatory shock likely septic in etiology Continue supportive management If and when patient is stable from septic shock standpoint, could consider PCI to protected LM/Lcx Unusually prolonged procedure time due to difficulty obtaining PA catheter access form Rt CFV, having to switch to Rt internal jugular access. Also, there was significant difficulty accessing all grafts, and the need to use multiple catheters. Elder Negus, MD  CT HEAD WO CONTRAST ( )  Result Date: 07/06/2023 CLINICAL DATA:  Delirium, acute encephalopathy EXAM: CT HEAD WITHOUT CONTRAST TECHNIQUE: Contiguous axial images were obtained from the base of the skull through the vertex without intravenous contrast. RADIATION DOSE REDUCTION: This exam was performed according to the departmental dose-optimization program which includes automated exposure  control, adjustment of the mA and/or kV according to patient size and/or use of iterative reconstruction technique. COMPARISON:  05/25/2023 FINDINGS: Brain: No evidence of acute infarction, hemorrhage, mass, mass effect, or midline shift. No hydrocephalus or extra-axial fluid collection. Vascular: No hyperdense vessel. Atherosclerotic calcifications in the intracranial carotid and vertebral arteries. Skull: Negative for fracture or focal lesion. Sinuses/Orbits: Mild mucosal thickening in the ethmoid air cells. No acute finding in the orbits. Status post bilateral lens replacements. Other: The mastoid air cells are well aerated. IMPRESSION: No acute intracranial process. Electronically Signed   By: Wiliam Ke M.D.   On: 06/28/2023 02:04   DG CHEST PORT 1 VIEW  Result Date: 06/24/2023 CLINICAL DATA:  Status post central line placement. EXAM: PORTABLE CHEST 1 VIEW COMPARISON:  Chest radiograph dated 06/24/2023. FINDINGS: The heart is enlarged. Vascular calcifications are seen in the aortic arch. Mild-to-moderate diffuse bilateral interstitial opacities appear similar to prior exam. Small bilateral pleural effusions are not significantly changed. No pneumothorax. An endotracheal tube terminates in the midthoracic trachea. An enteric tube enters the stomach and terminates below the field of view. A right internal jugular central venous catheter tip overlies the superior vena cava. IMPRESSION: 1. Right internal jugular central venous catheter tip overlies the superior vena cava. No pneumothorax. 2. Mild-to-moderate diffuse bilateral interstitial opacities and small bilateral pleural effusions appear similar to prior exam. Electronically Signed   By: Romona Curls M.D.   On: 06/24/2023 14:24   ECHOCARDIOGRAM LIMITED  Result Date: 06/24/2023    ECHOCARDIOGRAM LIMITED REPORT  Patient Name:   DARRAL KHOSLA Date of Exam: 06/24/2023 Medical Rec #:  [id number]           Height:       72.0 in Accession #:     [id number]          Weight:       251.1 lb Date of Birth:  05/30/1956           BSA:          2.347 m Patient Age:    31 years            BP:           95/63 mmHg Patient Gender: M                   HR:           96 bpm. Exam Location:  Inpatient Procedure: Limited Echo, Limited Color Doppler, Cardiac Doppler and Strain            Analysis STAT ECHO Indications:    Shock  History:        Patient has prior history of Echocardiogram examinations, most                 recent 06/19/2023. CHF, Previous Myocardial Infarction and CAD,                 COPD and Carotid Disease; Risk Factors:Sleep Apnea,                 Hypertension, Dyslipidemia and Diabetes.  Sonographer:    Raeford Razor RDCS Referring Phys: [phone] MIHAI CROITORU IMPRESSIONS  1. Left ventricular ejection fraction, by estimation, is 35 to 40%. The left ventricle has moderately decreased function. The left ventricle demonstrates regional wall motion abnormalities (see scoring diagram/findings for description). There is moderate left ventricular hypertrophy. Left ventricular diastolic function could not be evaluated. There is the interventricular septum is flattened in systole and diastole, consistent with right ventricular pressure and volume overload. There is moderate hypokinesis of the left ventricular, entire inferior wall and inferoseptal wall.  2. Right ventricular systolic function is severely reduced. The right ventricular size is severely enlarged.  3. Left atrial size was severely dilated.  4. Right atrial size was severely dilated.  5. Moderate mitral valve regurgitation. No evidence of mitral stenosis. Moderate mitral annular calcification.  6. Tricuspid valve regurgitation is moderate.  7. The aortic valve is tricuspid. There is mild calcification of the aortic valve. Aortic valve regurgitation is not visualized. Aortic valve sclerosis/calcification is present, without any evidence of aortic stenosis. Comparison(s): Prior images reviewed side by side.  Changes from prior study are noted. The left ventricular function is significantly worse. FINDINGS  Left Ventricle: Left ventricular ejection fraction, by estimation, is 35 to 40%. The left ventricle has moderately decreased function. The left ventricle demonstrates regional wall motion abnormalities. Moderate hypokinesis of the left ventricular, entire inferior wall and inferoseptal wall. Global longitudinal strain performed but not reported based on interpreter judgement due to suboptimal tracking. The left ventricular internal cavity size was normal in size. There is moderate left ventricular hypertrophy. The interventricular septum is flattened in systole and diastole, consistent with right ventricular pressure and volume overload. Left ventricular diastolic function could not be evaluated. Left ventricular diastolic function could not be evaluated due to atrial fibrillation. Right Ventricle: The right ventricular size is severely enlarged. Right ventricular systolic function is severely reduced. Left Atrium: Left atrial size was  severely dilated. Right Atrium: Right atrial size was severely dilated. Pericardium: Trivial pericardial effusion is present. Mitral Valve: Moderate mitral annular calcification. Moderate mitral valve regurgitation, with eccentric posteriorly directed jet. No evidence of mitral valve stenosis. Tricuspid Valve: The tricuspid valve is grossly normal. Tricuspid valve regurgitation is moderate. Aortic Valve: The aortic valve is tricuspid. There is mild calcification of the aortic valve. Aortic valve regurgitation is not visualized. Aortic valve sclerosis/calcification is present, without any evidence of aortic stenosis. Pulmonic Valve: The pulmonic valve was not well visualized. Pulmonic valve regurgitation is not visualized. No evidence of pulmonic stenosis. Aorta: The aortic root is normal in size and structure. Venous: IVC assessment for right atrial pressure unable to be performed due  to mechanical ventilation. LEFT VENTRICLE PLAX 2D LVIDd:         5.40 cm LVIDs:         4.40 cm LV PW:         1.60 cm LV IVS:        1.30 cm LVOT diam:     2.10 cm LVOT Area:     3.46 cm  LV Volumes (MOD) LV vol d, MOD A2C: 206.0 ml LV vol d, MOD A4C: 168.0 ml LV vol s, MOD A2C: 145.0 ml LV vol s, MOD A4C: 117.0 ml LV SV MOD A2C:     61.0 ml LV SV MOD A4C:     168.0 ml LV SV MOD BP:      54.3 ml RIGHT VENTRICLE          IVC RV Basal diam:  4.48 cm  IVC diam: 3.10 cm RV Mid diam:    3.00 cm LEFT ATRIUM             Index        RIGHT ATRIUM           Index LA diam:        5.60 cm 2.39 cm/m   RA Area:     25.60 cm LA Vol (A2C):   92.4 ml 39.37 ml/m  RA Volume:   80.50 ml  34.30 ml/m LA Vol (A4C):   80.1 ml 34.13 ml/m LA Biplane Vol: 88.3 ml 37.62 ml/m   AORTA Ao Root diam: 3.30 cm  SHUNTS Systemic Diam: 2.10 cm Rachelle Hora Croitoru MD Electronically signed by Thurmon Fair MD Signature Date/Time: 06/24/2023/10:38:29 AM    Final    DG Abd 1 View  Result Date: 06/24/2023 CLINICAL DATA:  Enteric tube placement. EXAM: ABDOMEN - 1 VIEW COMPARISON:  Abdominal radiograph performed the same day. FINDINGS: An enteric tube terminates in the stomach. IMPRESSION: Enteric tube terminates in the stomach. Electronically Signed   By: Romona Curls M.D.   On: 06/24/2023 10:31   DG Abd 1 View  Result Date: 06/24/2023 CLINICAL DATA:  Orogastric tube placement. EXAM: ABDOMEN - 1 VIEW COMPARISON:  None Available. FINDINGS: Distal tip of nasogastric tube is seen in expected position of proximal stomach. IMPRESSION: Distal tip of nasogastric tube seen in expected position of proximal stomach. Electronically Signed   By: Lupita Raider M.D.   On: 06/24/2023 09:49   DG CHEST PORT 1 VIEW  Result Date: 06/24/2023 CLINICAL DATA:  67 year old male intubated. Lower extremity swelling. EXAM: PORTABLE CHEST 1 VIEW COMPARISON:  Portable chest 0408 hours this morning and earlier. FINDINGS: Portable AP semi upright view at 0701 hours.  Intubated. Endotracheal tube tip in good position between the level the clavicles and carina. Stable cardiomegaly and mediastinal  contours. Prior CABG. Larger lung volumes. Pleural effusions and diffuse pulmonary interstitial opacity not significantly changed. No pneumothorax or consolidation. IMPRESSION: 1. Satisfactory endotracheal tube placement. 2. Larger lung volumes but otherwise stable ventilation. Pulmonary edema favored over bilateral infection. Electronically Signed   By: Odessa Fleming M.D.   On: 06/24/2023 08:19   DG CHEST PORT 1 VIEW  Result Date: 06/24/2023 CLINICAL DATA:  67 year old male lower extremity swelling. Fever. Acute on chronic heart failure. EXAM: PORTABLE CHEST 1 VIEW COMPARISON:  Portable chest 06/20/2023 and earlier. FINDINGS: Portable AP view at 0408 hours. Chronic cardiomegaly. Previous CABG. Small bilateral pleural effusions, not significantly changed. Pulmonary vascularity, interstitial opacity does appear mildly increased particularly in the right upper lung. No pneumothorax. No air bronchograms. Negative visible bowel gas. Stable visualized osseous structures. IMPRESSION: Cardiomegaly with ongoing small pleural effusions and mildly increasing pulmonary interstitial opacity favored to be progressive pulmonary edema. Infection felt less likely. Electronically Signed   By: Odessa Fleming M.D.   On: 06/24/2023 08:18   DG CHEST PORT 1 VIEW  Result Date: 06/21/2023 CLINICAL DATA:  Community-acquired pneumonia EXAM: PORTABLE CHEST 1 VIEW COMPARISON:  07/09/2023 FINDINGS: Coronary artery bypass grafting has been performed. Stable cardiomegaly. The lungs are symmetrically well expanded. Central pulmonary vascular engorgement and superimposed mild bilateral interstitial pulmonary edema as well as small bilateral pleural effusions appears slightly progressive in keeping with changes of progressive cardiogenic failure. No pneumothorax. IMPRESSION: 1. Progressive cardiogenic failure.  Stable  cardiomegaly. Electronically Signed   By: Helyn Numbers M.D.   On: 06/21/2023 00:20   ECHOCARDIOGRAM COMPLETE  Result Date: 06/19/2023    ECHOCARDIOGRAM REPORT   Patient Name:   SISTO INGRUM Date of Exam: 06/19/2023 Medical Rec #:  [id number]           Height:       72.0 in Accession #:    [id number]          Weight:       262.3 lb Date of Birth:  Aug 20, 1955           BSA:          2.391 m Patient Age:    67 years            BP:           108/39 mmHg Patient Gender: M                   HR:           80 bpm. Exam Location:  Inpatient Procedure: 2D Echo, Color Doppler and Cardiac Doppler Indications:    CHF  History:        Patient has prior history of Echocardiogram examinations, most                 recent 04/25/2022. CHF, Previous Myocardial Infarction and CAD,                 COPD and Carotid Disease; Risk Factors:Sleep Apnea, Obesity,                 Hypertension, Dyslipidemia and Diabetes. CKD.  Sonographer:    Milda Smart Referring Phys: [id number] RONDELL A Liebman IMPRESSIONS  1. Left ventricular ejection fraction, by estimation, is 55 to 60%. The left ventricle has normal function. The left ventricle has no regional wall motion abnormalities. There is moderate concentric left ventricular hypertrophy. Indeterminate diastolic filling due to E-A fusion. There is the interventricular septum is flattened in systole  and diastole, consistent with right ventricular pressure and volume overload.  2. Right ventricular systolic function is moderately reduced. The right ventricular size is severely enlarged. There is severely elevated pulmonary artery systolic pressure. The estimated right ventricular systolic pressure is 68.6 mmHg.  3. Left atrial size was mildly dilated.  4. Right atrial size was mildly dilated.  5. The mitral valve is grossly normal. Mild mitral valve regurgitation. No evidence of mitral stenosis.  6. Tricuspid valve regurgitation is moderate to severe.  7. The aortic valve is tricuspid.  Aortic valve regurgitation is not visualized. No aortic stenosis is present.  8. The inferior vena cava is dilated in size with <50% respiratory variability, suggesting right atrial pressure of 15 mmHg. Comparison(s): The right ventricular systolic function is worse. RVSP severely elevated. Conclusion(s)/Recommendation(s): Findings consistent with Cor Pulmonale. FINDINGS  Left Ventricle: Left ventricular ejection fraction, by estimation, is 55 to 60%. The left ventricle has normal function. The left ventricle has no regional wall motion abnormalities. The left ventricular internal cavity size was normal in size. There is  moderate concentric left ventricular hypertrophy. The interventricular septum is flattened in systole and diastole, consistent with right ventricular pressure and volume overload. Indeterminate diastolic filling due to E-A fusion. Right Ventricle: The right ventricular size is severely enlarged. No increase in right ventricular wall thickness. Right ventricular systolic function is moderately reduced. There is severely elevated pulmonary artery systolic pressure. The tricuspid regurgitant velocity is 3.66 m/s, and with an assumed right atrial pressure of 15 mmHg, the estimated right ventricular systolic pressure is 68.6 mmHg. Left Atrium: Left atrial size was mildly dilated. Right Atrium: Right atrial size was mildly dilated. Pericardium: There is no evidence of pericardial effusion. Mitral Valve: The mitral valve is grossly normal. Mild mitral valve regurgitation. No evidence of mitral valve stenosis. MV peak gradient, 10.5 mmHg. The mean mitral valve gradient is 3.0 mmHg. Tricuspid Valve: The tricuspid valve is grossly normal. Tricuspid valve regurgitation is moderate to severe. No evidence of tricuspid stenosis. Aortic Valve: The aortic valve is tricuspid. Aortic valve regurgitation is not visualized. No aortic stenosis is present. Pulmonic Valve: The pulmonic valve was grossly normal. Pulmonic  valve regurgitation is not visualized. No evidence of pulmonic stenosis. Aorta: The aortic root and ascending aorta are structurally normal, with no evidence of dilitation. Venous: The inferior vena cava is dilated in size with less than 50% respiratory variability, suggesting right atrial pressure of 15 mmHg. IAS/Shunts: The atrial septum is grossly normal.  LEFT VENTRICLE PLAX 2D LVIDd:         5.50 cm      Diastology LVIDs:         4.20 cm      LV e' medial:    4.35 cm/s LV PW:         1.38 cm      LV E/e' medial:  33.6 LV IVS:        1.40 cm      LV e' lateral:   12.40 cm/s LVOT diam:     2.50 cm      LV E/e' lateral: 11.8 LV SV:         102 LV SV Index:   43 LVOT Area:     4.91 cm  LV Volumes (MOD) LV vol d, MOD A2C: 143.0 ml LV vol d, MOD A4C: 139.0 ml LV vol s, MOD A2C: 58.5 ml LV vol s, MOD A4C: 62.2 ml LV SV MOD A2C:  84.5 ml LV SV MOD A4C:     139.0 ml LV SV MOD BP:      84.2 ml RIGHT VENTRICLE RV Basal diam:  5.50 cm RV Mid diam:    4.00 cm RV S prime:     8.38 cm/s TAPSE (M-mode): 1.8 cm LEFT ATRIUM             Index        RIGHT ATRIUM           Index LA diam:        5.60 cm 2.34 cm/m   RA Area:     27.40 cm LA Vol (A2C):   76.6 ml 32.04 ml/m  RA Volume:   91.60 ml  38.31 ml/m LA Vol (A4C):   73.2 ml 30.64 ml/m LA Biplane Vol: 87.2 ml 36.47 ml/m  AORTIC VALVE LVOT Vmax:   104.00 cm/s LVOT Vmean:  74.200 cm/s LVOT VTI:    0.208 m  AORTA Ao Root diam: 3.50 cm Ao Asc diam:  3.10 cm MITRAL VALVE                TRICUSPID VALVE MV Area (PHT): 3.81 cm     TR Peak grad:   53.6 mmHg MV Area VTI:   2.63 cm     TR Mean grad:   33.0 mmHg MV Peak grad:  10.5 mmHg    TR Vmax:        366.00 cm/s MV Mean grad:  3.0 mmHg     TR Vmean:       270.0 cm/s MV Vmax:       1.62 m/s MV Vmean:      85.8 cm/s    SHUNTS MV Decel Time: 199 msec     Systemic VTI:  0.21 m MR Peak grad: 64.6 mmHg     Systemic Diam: 2.50 cm MR Vmax:      402.00 cm/s MV E velocity: 146.00 cm/s Lennie Odor MD Electronically signed by Lennie Odor MD Signature Date/Time: 06/19/2023/4:22:24 PM    Final    DG Chest Port 1 View  Result Date: 06/21/2023 CLINICAL DATA:  Cough. EXAM: PORTABLE CHEST 1 VIEW COMPARISON:  Chest radiograph dated 02/17/2020 and CT dated 05/15/2022. FINDINGS: There is cardiomegaly with vascular congestion and edema. Pneumonia is not excluded. There is bilateral calcified pleural plaques. Trace bilateral pleural effusions versus pleural thickening. There is mild cardiomegaly. Atherosclerotic calcification of the aortic arch. Median sternotomy wires and CABG vascular clips. No acute osseous pathology. IMPRESSION: 1. Cardiomegaly with vascular congestion and edema. Pneumonia is not excluded. 2. Bilateral calcified pleural plaques. Electronically Signed   By: Elgie Collard M.D.   On: 06/16/2023 16:10    Microbiology No results found for this or any previous visit (from the past 240 hour(s)).  Lab Basic Metabolic Panel: No results for input(s): "NA", "K", "CL", "CO2", "GLUCOSE", "BUN", "CREATININE", "CALCIUM", "MG", "PHOS" in the last 168 hours. Liver Function Tests: No results for input(s): "AST", "ALT", "ALKPHOS", "BILITOT", "PROT", "ALBUMIN" in the last 168 hours. No results for input(s): "LIPASE", "AMYLASE" in the last 168 hours. No results for input(s): "AMMONIA" in the last 168 hours. CBC: No results for input(s): "WBC", "NEUTROABS", "HGB", "HCT", "MCV", "PLT" in the last 168 hours. Cardiac Enzymes: No results for input(s): "CKTOTAL", "CKMB", "CKMBINDEX", "TROPONINI" in the last 168 hours. Sepsis Labs: No results for input(s): "PROCALCITON", "WBC", "LATICACIDVEN" in the last 168 hours.  Procedures/Operations  Mechanical ventilation, PAC placement, CRRT.  Dialysis catheter  placement.    Erinn Mendosa 07/09/2023, 5:05 PM

## 2023-07-15 NOTE — Progress Notes (Signed)
Time of death 1611, pronounced by [place] Caitlyn Buchanan RN and Christain Sacramento RN. Asystole strip printed and placed in chart. Family at bedside at time of death, belongings returned to spouse.

## 2023-07-15 NOTE — Consult Note (Signed)
Palliative Medicine Inpatient Consult Note  Consulting Provider:  Reason for consult:   Palliative Care Consult Services Palliative Medicine Consult  Reason for Consult? advanced RV failure, now ventilated. Uncertain that he will extubate. Wife in shock...   06/26/2023  HPI:  Per intake H&P --> 67 yo M PMH follicular lymphoma in remission, RV failure, severe PAH g3, HTN, HLD, CAD, Afib/flutter, OSA/OHS, obesity, venous insufficiency, chronic lower extremity wounds who was admitted 11/4 for lower extremity/groin swelling after presenting to his outpt wound care clinic. Treated for heart failure exacerbation/cardiogenic shock, (+) altered and now intubated.   Palliative care asked to get involved for further goals of care conversations in the setting of advance right heart failure.    Clinical Assessment/Goals of Care:  *Please note that this is a verbal dictation therefore any spelling or grammatical errors are due to the "Dragon Medical One" system interpretation.  I have reviewed medical records including EPIC notes, labs and imaging, received report from bedside RN, assessed the patient who is lying in bed intubated minimally responsive.    I met with patients wife, [name], son, daughter, and daughter in law to further discuss diagnosis prognosis, GOC, EOL wishes, disposition and options.   I introduced Palliative Medicine as specialized medical care for people living with serious illness. It focuses on providing relief from the symptoms and stress of a serious illness. The goal is to improve quality of life for both the patient and the family.  Medical History Review and Understanding:  Discussed patient's past medical history significant for coronary artery disease, hypertension, heart failure, lower extremity wounds and venous insufficiency.  Social History:  [name] lives in [place].  He and his wife surely have known each other for the past 45 years.   They have been married for 13 years.  He has 2 children.  He is identified as an incredibly wonderful man who had been through hard times in his life and has compassion and empathy for all those he needs.  He is identified to be strong.  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  He is a man of faith and practices within Christianity-his wife shares that he rejoined the church about 6 months ago.  Functional and Nutritional State:  Preceding hospitalization [name] had declined with his overall health and got to a point where his walking was quite impaired.  He was still able to get around slowly and perform self-care activities.  Advance Directives:  A detailed discussion was had today regarding advanced directives.  Patient's wife is a Runner, broadcasting/film/video.  Code Status:  Concepts specific to code status, artifical feeding and hydration, continued IV antibiotics and rehospitalization was had.  The difference between a aggressive medical intervention path  and a palliative comfort care path for this patient at this time was had.   Discussion:  Dr. Denese Killings came by and updated patient's family on his overall health.  He shares that the medical team has truly done everything they can to improve his cardiac function.  The greatest concern despite the heart, liver, and kidneys is [name]'s profound weakness.  Dr. Denese Killings shares they tried to wean down the vent settings yesterday and patient became quite uncomfortable relatively fast.  He shared the options from here: one being removing the patient from the ventilator and seeing how he does thereafter.  He shares openly and honestly that the patient may not fare well and he  would not want [name] to be in distress if it could be avoided.Alternatively, the option of keeping [name] comfortable with medications when the ventilator was removed was offered.  Patient's wife shares that he is tired and she does  not desire that he suffer anymore but he already has. The family spoke to each other and determined the best path forward was one of comfort for [facility]. We discussed what this would look like in terms of medications used and liberation from the ventilator.  Patients family did request chaplain support.  Patients primary medical team and nursing team updated on the above.   Decision Maker: [name],[name] (Spouse) [phone] (Mobile)   SUMMARY OF RECOMMENDATIONS   DNAR  Transition to comfort care  Plan for compassionate extubation this afternoon  Initiate dilaudid and midazolam gtts to promote comfort  Additional comfort medications per [facility]  Unrestricted visitation  Ongoing PMT support  Code Status/Advance Care Planning: DNAR   Palliative Prophylaxis:  Aspiration, Bowel Regimen, Delirium Protocol, Frequent Pain Assessment, Oral Care, Palliative Wound Care, and Turn Reposition  Additional Recommendations (Limitations, Scope, Preferences): Continue current care  Psycho-social/Spiritual:  Desire for further Chaplaincy support: Yes Additional Recommendations: Discussions r/t comfort care   Prognosis: Limited hours to days.   Discharge Planning: Discharge will be celestial.   Vitals:   07/13/2023 0545 06/26/2023 0600  BP:  (!) 99/47  Pulse: 79 76  Resp: 20 19  Temp: 98.8 F (37.1 C) 98.6 F (37 C)  SpO2: 99% 99%    Intake/Output Summary (Last 24 hours) at 06/21/2023 0747 Last data filed at 06/21/2023 0600 Gross per 24 hour  Intake 2179.82 ml  Output 2300 ml  Net -120.18 ml   Last Weight  Most recent update: 06/26/2023  5:45 AM    Weight  108.4 kg (238 lb 15.7 oz)            Gen:  Elderly Caucasian M chronically ill appearing HEENT: coretrack, dry mucous membranes CV: Regular rate and rhythm  PULM: Mechanical ventilator  ABD: soft/nontender  EXT: (+) pitting edema to thighs Neuro: Somnolent  PPS: 10%   This conversation/these recommendations were  discussed with patient primary care team, Dr. Denese Killings  Billing based on MDM: High  Problems Addressed: One acute or chronic illness or injury that poses a threat to life or bodily function  Amount and/or Complexity of Data: Category 3:Discussion of management or test interpretation with external physician/other qualified health care professional/appropriate source (not separately reported)  Risks: Decision regarding hospitalization or escalation of hospital care and Decision not to resuscitate or to de-escalate care because of poor prognosis ______________________________________________________ Lamarr Lulas Searles Palliative Medicine Team Team Cell Phone: [phone] Please utilize secure chat with additional questions, if there is no response within 30 minutes please call the above phone number  Palliative Medicine Team providers are available by phone from 7am to 7pm daily and can be reached through the team cell phone.  Should this patient require assistance outside of these hours, please call the patient's attending physician.

## 2023-07-15 NOTE — Progress Notes (Signed)
Chaplain paged by nurse at family request before pt is extubated. Death anticipated. Chaplain provides support and allows family to perform life review. Chaplain offers prayer.

## 2023-07-15 NOTE — Progress Notes (Signed)
eLink Physician-Brief Progress Note Patient Name: [name] DOB: 1956/03/07 MRN: [id number]   Date of Service  07/07/2023  HPI/Events of Note  Hypokalemia on a.m. labs.  eICU Interventions  Oral potassium replacement ordered.     Intervention Category Minor Interventions: Electrolytes abnormality - evaluation and management  Carilyn Goodpasture 07/14/2023, 4:44 AM

## 2023-07-15 NOTE — Progress Notes (Signed)
35 ml of hydromorphone (dilaudid) from 50mg  in 50 ml NS (1mg /mL) premix infusion wasted per protocol into Stericycle bin by [place] Yezenia Fredrick RN, witnessed by Joseph Art RN

## 2023-07-15 NOTE — Progress Notes (Signed)
PHARMACY - ANTICOAGULATION CONSULT NOTE  Pharmacy Consult for bivalirudin Indication:  atrial flutter  Allergies  Allergen Reactions   Bendamustine Hcl     See hospital notes from 01/12/15, legs turned black   Heparin Other (See Comments)    Heparin induced thrombocytopenia    Bendamustine Hcl Rash    Severe   Tape Rash    If left on for prolong periods of time     Patient Measurements: Height: 6' (182.9 cm) Weight: 108.4 kg (238 lb 15.7 oz) IBW/kg (Calculated) : 77.6 Heparin Dosing Weight: 113.9 kg  Vital Signs: Temp: 98.6 F (37 C) (11/17 0600) BP: 99/47 (11/17 0600) Pulse Rate: 76 (11/17 0600)  Labs: Recent Labs    06/29/23 0314 06/30/23 0406 07/12/2023 0324  HGB 7.9* 7.3* 7.1*  HCT 26.3* 25.2* 23.9*  PLT 160 170 152  APTT 70* 67* 61*  CREATININE 1.48* 1.87* 2.10*    Estimated Creatinine Clearance: 42.2 mL/min (A) (by C-G formula based on SCr of 2.1 mg/dL (H)).   Medical History: Past Medical History:  Diagnosis Date   Anxiety    Basal cell carcinoma of nose    Blood dyscrasia    trouble clotting    Carotid artery disease (HCC)    a. s/p L CEA 2009 (hx of evacuation of hematoma due to heparin); b. 01/2022 Carotid U/S: 1-39% bilat stenosis.   Chronic heart failure with preserved ejection fraction (HFpEF) (HCC)    a. 04/2022 Echo: EF 60-65%, no rwma, GrII DD, nl RV fxn, RVSP 47.30mmHg. Mild BAE. Mild MR. Mild-mod TR.   Chronic lower back pain    CKD (chronic kidney disease), stage III (HCC)    COPD (chronic obstructive pulmonary disease) (HCC)    Coronary artery disease    a. 2005 s/p CABG x 3 (LIMA->LAD, VG->OM1, VG->dRCA); b. 09/2011 s/p PCI/DES of the VG->OM1 and VG->dRCA; c. 11/2017 PCI: LM 50ost, LAD 90ost/p, OM1 100 CTO, RCA 100p CTO, VG->dRCA 99 ISR (4.0x22 Resolute Onyx DES), VG->OM1 100 CTO/ISR, LIMA->LAD patent.   Depression    Diabetes mellitus without complication (HCC)    borderline    Dysrhythmia    fluttering   Edema    GERD  (gastroesophageal reflux disease)    Heart murmur    Heparin induced thrombocytopenia (HCC)    History of home oxygen therapy    2 liters at night prn   Hyperlipidemia    Hypertension    Lymphoma (HCC) 12/21/2014   Myocardial infarction (HCC)    Normocytic anemia    Obesity    Sleep apnea    a. Not using CPAP.    Medications:  Medications Prior to Admission  Medication Sig Dispense Refill Last Dose   albuterol (PROVENTIL) (2.5 MG/3ML) 0.083% nebulizer solution INHALE 1 VIAL VIA NEBULIZER EVERY DAY AS NEEDED FOR WHEEZING/SHORTNESS OF BREATH 75 mL 5 Past Month   aspirin EC 81 MG tablet Take 81 mg by mouth every evening.    06/17/2023   aspirin-sod bicarb-citric acid (ALKA-SELTZER) 325 MG TBEF tablet Take 650 mg by mouth every 6 (six) hours as needed (indigestion).   Past Week   clonazePAM (KLONOPIN) 1 MG tablet Take 1 tablet (1 mg total) by mouth at bedtime. 30 tablet 5 06/17/2023   clopidogrel (PLAVIX) 75 MG tablet TAKE 1 TABLET BY MOUTH EVERY DAY WITH BREAKFAST 90 tablet 3 06/16/2023 at AM   metolazone (ZAROXOLYN) 5 MG tablet Take 1 tablet (5 mg total) by mouth daily as needed (swelling). 90 tablet 1 06/17/2023  metoprolol succinate (TOPROL-XL) 25 MG 24 hr tablet Take 0.5 tablets (12.5 mg total) by mouth daily. Take with or immediately following a meal.   06/17/2023   nitroGLYCERIN (NITROSTAT) 0.4 MG SL tablet Place 1 tablet (0.4 mg total) under the tongue every 5 (five) minutes as needed for chest pain (Up to 3 doses). 25 tablet 1 unknown   oxyCODONE-acetaminophen (PERCOCET) 7.5-325 MG tablet Take 1 tablet by mouth every 6 (six) hours as needed for severe pain. 120 tablet 0 07/03/2023   oxymetazoline (AFRIN) 0.05 % nasal spray Place 1 spray into both nostrils 2 (two) times daily as needed for congestion.   unknown   simvastatin (ZOCOR) 20 MG tablet Take 1 tablet (20 mg total) by mouth at bedtime. 90 tablet 3 06/17/2023   torsemide (DEMADEX) 20 MG tablet Take 20 mg by mouth daily.   06/17/2023    senna-docusate (SENOKOT-S) 8.6-50 MG tablet Take 1-2 tablets by mouth at bedtime. (Patient not taking: Reported on 06/23/2023) 100 tablet 6 Not Taking   Scheduled:   acetaZOLAMIDE  500 mg Per Tube BID   amiodarone  200 mg Per NG tube BID   atorvastatin  40 mg Per Tube Daily   budesonide (PULMICORT) nebulizer solution  0.25 mg Nebulization BID   Chlorhexidine Gluconate Cloth  6 each Topical Daily   clopidogrel  75 mg Per NG tube Daily   famotidine  20 mg Per Tube Daily   feeding supplement (PROSource TF20)  60 mL Per Tube TID   hydrocortisone sod succinate (SOLU-CORTEF) inj  100 mg Intravenous Q12H   insulin aspart  2-6 Units Subcutaneous Q4H   lactulose  20 g Per Tube TID   liver oil-zinc oxide   Topical BID   midodrine  10 mg Per Tube TID WC   nutrition supplement (JUVEN)  1 packet Per Tube BID BM   mouth rinse  15 mL Mouth Rinse Q2H   polyethylene glycol  17 g Per Tube Daily   revefenacin  175 mcg Nebulization Daily   rifaximin  550 mg Oral BID   senna-docusate  1 tablet Per Tube BID   sodium chloride flush  3 mL Intravenous Q12H   Infusions:   bivalirudin (ANGIOMAX) 250 mg in sodium chloride 0.9 % 500 mL (0.5 mg/mL) infusion 0.05 mg/kg/hr (06/24/2023 0600)   feeding supplement (VITAL 1.5 CAL) 60 mL/hr at 06/21/2023 0600   norepinephrine (LEVOPHED) Adult infusion Stopped (07/05/2023 0022)   vasopressin Stopped (06/29/23 1054)   PRN: acetaminophen, albuterol, fentaNYL, fentaNYL (SUBLIMAZE) injection, fentaNYL (SUBLIMAZE) injection, ondansetron (ZOFRAN) IV, mouth rinse, senna-docusate, sodium chloride flush  Assessment: 56 YOM with PMH of follicular lymphoma in remission, RV failure, severe PAH, HTN, HLD, CAD, Afib/flutter, OSA, chronic lower extremity wounds. Returned to ICU 11/10 with worsening fever, hypotension consistent with septic shock. Patient is also persistently in either AF or Aflutter. Previously patient was on Eliquis and is now on bivalirudin with plans for possible  procedures and history of HIT  Underwent cardiac cath 11/11 finding severe multivessel CAD -aPTT 61sec at goal on bivalirudin 0.05 mg/kg/hr, HGB slow drift down7s - no bleeding noted pltc stable 150s  Goal of Therapy:  aPTT 50-85 seconds Monitor platelets by anticoagulation protocol: Yes   Plan:  Continue bivalirudin at 0.05 mg/kg/hour  Monitor daily aPTT, CBC, and for s/sx of bleeding     Leota Sauers Pharm.D. CPP, BCPS Clinical Pharmacist [phone] 07/03/2023 7:53 AM

## 2023-07-15 NOTE — Progress Notes (Signed)
Brief cardiology note:  Discussed plan with Dr. Denese Killings. I agree completely with his plan, and I do not have anything additional to add at this time. Cardiology will sign off, but please contact us with any questions.  Jodelle Red, MD, PhD, [facility]  [facility]  [place]  [facility] at [facility] at [facility] [address] [place], [place] [address] [phone]

## 2023-07-15 NOTE — Procedures (Signed)
Extubation Procedure Note  Patient Details:   Name: [name] DOB: Mar 04, 1956 MRN: [id number]   Airway Documentation:    Vent end date: 07/12/2023 Vent end time: 1406   Evaluation  O2 sats: stable throughout Complications: No apparent complications Patient did tolerate procedure well. Bilateral Breath Sounds: Diminished   No  Pt had a compassionate extubation per order.   Kerri Perches 06/22/2023, 2:06 PM

## 2023-07-15 NOTE — Progress Notes (Signed)
NAME:  [name], MRN:  [id number], DOB:  [birth year], LOS: 13 ADMISSION DATE:  07/10/2023, CONSULTATION DATE:  06/24/23 REFERRING MD:  Duard Larsen - TRH, CHIEF COMPLAINT:  AMS   History of Present Illness:  67 yo M PMH follicular lymphoma in remission, RV failure, severe PAH g3, HTN, HLD, CAD, Afib/flutter, OSA/OHS, obesity, venous insufficiency, chronic lower extremity wounds who was admitted 11/4 for lower extremity/groin swelling after presenting to his outpt wound care clinic.  It was identified that pt was not taking diuretic as Rx. Admitted to HF exacerbation w concomitant AKI, cards consulted and aggressively diuresed 11/4-11/8  On 11/9 the pt was more altered. Ammonia was sent and resulted elevated 129. Lactulose started. Over the course of 11/9-11/10 night shift pt was progressively more lethargic and started to have low BP as well as low-grade temp which was new. TRH sent cx, started broad abx.  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 Discovered that he wasn't taking diuretic as Rx. AKI, HF exacerbation. Aggressive diuresis started 11/5 - 11/8 ongoing diuresis.  11/9 night AMS, hyperammonemia started lactulose 11/10 early AM hypotension worse AMS. CCM consult, ICU transfer  11/11 - LHC grafts patent, progression of native disease. LVEDP 27. RHC low SVR, high CI consistent with sepsis, but PCWP still high at 31.   Interim History / Subjective:   Yesterday again, still weak and only tolerated short SBT.   Objective   Blood pressure (!) 99/47, pulse 76, temperature 98.6 F (37 C), resp. rate 19, height 6' (1.829 m), weight  108.4 kg, SpO2 99%. PAP: (53-75)/(19-32) 53/21 CVP:  [6 mmHg-21 mmHg] 9 mmHg PCWP:  [28 mmHg] 28 mmHg CO:  [7.1 L/min-7.7 L/min] 7.7 L/min CI:  [3.17 L/min/m2-3.4 L/min/m2] 3.4 L/min/m2  Vent Mode: PRVC FiO2 (%):  [40 %] 40 % Set Rate:  [20 bmp] 20 bmp Vt Set:  [620 mL] 620 mL PEEP:  [5 cmH20] 5 cmH20 Pressure Support:  [10 cmH20] 10 cmH20 Plateau Pressure:  [20 cmH20-24 cmH20] 22 cmH20   Intake/Output Summary (Last 24 hours) at 07/05/2023 0743 Last data filed at 06/26/2023 0600 Gross per 24 hour  Intake 2179.82 ml  Output 2300 ml  Net -120.18 ml   Filed Weights   06/29/23 0400 06/30/23 0500 06/30/2023 0500  Weight: 114.5 kg 115.3 kg 108.4 kg    Examination: General: overweight man on mechanical ventilation.  HENT: orally intubated, OGT in place Lungs: Chest clear.  Cardiovascular: in rate controlled flutter.  Extremities are warmer.  Diffuse apex with RV lift. Loud S1/S2 but no murmur.  Right sided gallop Abdomen: soft round. BS are present.  Extremities: BLE wrapped, edematous, mainly in dependent areas. ++ bruising.  Neuro:  somnolent but commands. Generalized weakness, can barely move against gravity in the extremities, unable to lift head of the the bed.  GU: scrotal edema much improved, Foley catheter in place.  Ancillary Tests Personally Reviewed:  Cultures are negative. WBC has normalized at 10.0 HB 7.1 CO2  34 Creatinine continues to rise to 2.10 Ammonia 111 Assessment & Plan:   Mixed shock picture based on hemodynamics: distributive based on low SVR but likely also cardiogenic shock  component given  elevated PCWP. - Hemodynamics have improved.  Demand related NSTEACS on background of HFpEF with cor pulmonale from COPD/OHS Chronic right heart failure with PAH (group 3) with volume overload and cardiac cirrhosis.  Transaminitis due to worsening hepatic congestion vs shock liver - improving.  Aflutter - now rate controlled.  CAD s/p CABG x3, PCI - no evidence of  new coronary obstruction.  HLD Hx HIT  Acute encephalopathy - multifactorial, likely shock related - improving.  AKI - due to cardiorenal syndrome - worsening in spite of normalized hemodynamics.  Frailty with sarcopenia and falls at home  Plan:  -Hemodynamics remains improved with normal blood pressure and index off inotropic support. Will pull PAC.  - Continue midodrine  - Will taper stress steroids now.  - Filling pressures remain high but creatinine keeps rising so will back off today. RV function may not be sufficient to allow for peripheral fluid mobilization. Filling pressures may now be at baseline. 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 - Continue amiodarone orally - Continue Yupelri.  - Continue bivalirudin for Afib.   - Resumed high potency  statin  - Complete 7 days of antibiotics empirically. Cultures are negative but hemodynamics are consistent with septic shock.  Follow leukocytosis.   - Continue wound care per WOC RN - Continue lactulose and added rifaximin. HE is only potentially reversible cause for persistent lethargy.  - While he has made progress from the beginning of the week, he remains very weak and I doubt that he will tolerate extubation. He has been off continuous sedation for >48h and still remains too weak to safely extubate. This is likely as good as he is going to get. I fear that reintubation, or tracheostomy would only prolong his eventual decline.  I have spoken to the patient's wife and daughter and have shared my concerns with them and have suggested that a transition to comfort care might be a better option given his end-stage heart failure and frailty.   - Palliative care consultation.   Best Practice (right click and "Reselect all SmartList Selections" daily)   Diet/type: Continue tube feeds.  DVT prophylaxis: Bivalirudin - has HITT history GI prophylaxis: H2 blocker Lines: Right internal  jugular  Foley:  N/A - external catheter.  Code Status:  full code Last date of multidisciplinary goals of care discussion [11/16 - as documented above]  CRITICAL CARE Performed by: Lynnell Catalan  Total critical care time: 40 minutes  Critical care time was exclusive of separately billable procedures and treating other patients.  Critical care was necessary to treat or prevent imminent or life-threatening deterioration.  Critical care was time spent personally by me on the following activities: development of treatment plan with patient and/or surrogate as well as nursing, discussions with consultants, evaluation of patient's response to treatment, examination of patient, obtaining history from patient or surrogate, ordering and performing treatments and interventions, ordering and review of laboratory studies, ordering and review of radiographic studies, pulse oximetry, re-evaluation of patient's condition and participation in multidisciplinary rounds.  Lynnell Catalan, MD [facility] ICU Physician [facility]  Pager: [phone] Mobile: [phone] After hours: [phone].  06/17/2023, 7:43 AM

## 2023-07-15 DEATH — deceased

## 2023-07-17 ENCOUNTER — Encounter: Payer: Medicare Other | Admitting: Licensed Clinical Social Worker

## 2023-07-25 ENCOUNTER — Ambulatory Visit: Payer: Medicare Other | Admitting: Nurse Practitioner

## 2023-08-04 ENCOUNTER — Other Ambulatory Visit: Payer: Self-pay | Admitting: Internal Medicine

## 2023-08-14 ENCOUNTER — Ambulatory Visit: Payer: Medicare Other | Admitting: Nurse Practitioner

## 2023-09-28 ENCOUNTER — Other Ambulatory Visit: Payer: Self-pay | Admitting: Cardiovascular Disease

## 2024-06-20 ENCOUNTER — Ambulatory Visit: Payer: Medicare Other | Admitting: Hematology and Oncology

## 2024-06-20 ENCOUNTER — Other Ambulatory Visit: Payer: Medicare Other
